# Patient Record
Sex: Male | Born: 1950 | Race: White | Hispanic: No | Marital: Married | Smoking: Current some day smoker
Health system: Southern US, Community
[De-identification: ages and names within clinical notes are randomized; demographics above are authoritative.]

## PROBLEM LIST (undated history)

## (undated) ENCOUNTER — Emergency Department (HOSPITAL_COMMUNITY): Admission: EM | Discharge status: Absent without leave

## (undated) DIAGNOSIS — Z85828 Personal history of other malignant neoplasm of skin: Secondary | ICD-10-CM

## (undated) DIAGNOSIS — N289 Disorder of kidney and ureter, unspecified: Secondary | ICD-10-CM

## (undated) DIAGNOSIS — Z860100 Personal history of colon polyps, unspecified: Secondary | ICD-10-CM

## (undated) DIAGNOSIS — Z7901 Long term (current) use of anticoagulants: Secondary | ICD-10-CM

## (undated) DIAGNOSIS — E119 Type 2 diabetes mellitus without complications: Secondary | ICD-10-CM

## (undated) DIAGNOSIS — M1990 Unspecified osteoarthritis, unspecified site: Secondary | ICD-10-CM

## (undated) DIAGNOSIS — C679 Malignant neoplasm of bladder, unspecified: Secondary | ICD-10-CM

## (undated) DIAGNOSIS — C4490 Unspecified malignant neoplasm of skin, unspecified: Secondary | ICD-10-CM

## (undated) DIAGNOSIS — E785 Hyperlipidemia, unspecified: Secondary | ICD-10-CM

## (undated) DIAGNOSIS — Z9989 Dependence on other enabling machines and devices: Secondary | ICD-10-CM

## (undated) DIAGNOSIS — D494 Neoplasm of unspecified behavior of bladder: Secondary | ICD-10-CM

## (undated) DIAGNOSIS — I480 Paroxysmal atrial fibrillation: Secondary | ICD-10-CM

## (undated) DIAGNOSIS — R351 Nocturia: Secondary | ICD-10-CM

## (undated) DIAGNOSIS — N2889 Other specified disorders of kidney and ureter: Secondary | ICD-10-CM

## (undated) DIAGNOSIS — I10 Essential (primary) hypertension: Secondary | ICD-10-CM

## (undated) DIAGNOSIS — H269 Unspecified cataract: Secondary | ICD-10-CM

## (undated) DIAGNOSIS — Z9889 Other specified postprocedural states: Secondary | ICD-10-CM

## (undated) HISTORY — PX: BLEPHAROPLASTY: SUR158

## (undated) HISTORY — PX: COLONOSCOPY: SHX174

## (undated) HISTORY — PX: PATELLAR TENDON REPAIR: SHX737

## (undated) HISTORY — DX: Type 2 diabetes mellitus without complications: E11.9

## (undated) HISTORY — DX: Unspecified cataract: H26.9

## (undated) HISTORY — DX: Personal history of colonic polyps: Z86.010

## (undated) HISTORY — DX: Essential (primary) hypertension: I10

## (undated) HISTORY — DX: Personal history of colon polyps, unspecified: Z86.0100

## (undated) HISTORY — DX: Hyperlipidemia, unspecified: E78.5

## (undated) HISTORY — DX: Unspecified malignant neoplasm of skin, unspecified: C44.90

## (undated) HISTORY — PX: OTHER SURGICAL HISTORY: SHX169

## (undated) MED FILL — Fosaprepitant Dimeglumine For IV Infusion 150 MG (Base Eq): INTRAVENOUS | Qty: 5 | Status: AC

## (undated) MED FILL — Dexamethasone Sodium Phosphate Inj 100 MG/10ML: INTRAMUSCULAR | Qty: 1 | Status: AC

---
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
## 2003-12-26 ENCOUNTER — Encounter (INDEPENDENT_AMBULATORY_CARE_PROVIDER_SITE_OTHER): Payer: Self-pay | Admitting: Specialist

## 2003-12-26 ENCOUNTER — Ambulatory Visit (HOSPITAL_COMMUNITY): Admission: RE | Admit: 2003-12-26 | Discharge: 2003-12-26 | Payer: Self-pay | Admitting: Gastroenterology

## 2004-04-16 HISTORY — PX: PATELLAR TENDON REPAIR: SHX737

## 2004-04-17 ENCOUNTER — Inpatient Hospital Stay (HOSPITAL_COMMUNITY): Admission: RE | Admit: 2004-04-17 | Discharge: 2004-04-19 | Payer: Self-pay | Admitting: Orthopedic Surgery

## 2007-08-21 ENCOUNTER — Ambulatory Visit: Payer: Self-pay | Admitting: Family Medicine

## 2007-08-21 ENCOUNTER — Telehealth: Payer: Self-pay | Admitting: Family Medicine

## 2007-08-21 DIAGNOSIS — I10 Essential (primary) hypertension: Secondary | ICD-10-CM | POA: Insufficient documentation

## 2007-08-21 DIAGNOSIS — E785 Hyperlipidemia, unspecified: Secondary | ICD-10-CM | POA: Insufficient documentation

## 2007-11-19 ENCOUNTER — Telehealth: Payer: Self-pay | Admitting: Family Medicine

## 2007-11-26 ENCOUNTER — Ambulatory Visit: Payer: Self-pay | Admitting: Family Medicine

## 2007-11-26 LAB — CONVERTED CEMR LAB
AST: 19 units/L (ref 0–37)
Albumin: 3.8 g/dL (ref 3.5–5.2)
Chloride: 101 meq/L (ref 96–112)
Creatinine, Ser: 0.9 mg/dL (ref 0.4–1.5)
Creatinine,U: 281.4 mg/dL
Direct LDL: 91.1 mg/dL
Eosinophils Relative: 1.4 % (ref 0.0–5.0)
HCT: 42 % (ref 39.0–52.0)
Hemoglobin: 14.4 g/dL (ref 13.0–17.0)
Hgb A1c MFr Bld: 9.2 % — ABNORMAL HIGH (ref 4.6–6.0)
Lymphocytes Relative: 27.8 % (ref 12.0–46.0)
MCHC: 34.2 g/dL (ref 30.0–36.0)
Microalb, Ur: 8.6 mg/dL — ABNORMAL HIGH (ref 0.0–1.9)
Monocytes Absolute: 0.6 10*3/uL (ref 0.2–0.7)
Monocytes Relative: 9 % (ref 3.0–11.0)
Nitrite: NEGATIVE
PSA: 1.22 ng/mL (ref 0.10–4.00)
RBC: 4.67 M/uL (ref 4.22–5.81)
RDW: 12 % (ref 11.5–14.6)
TSH: 1.18 microintl units/mL (ref 0.35–5.50)
Total Bilirubin: 0.8 mg/dL (ref 0.3–1.2)
Total CHOL/HDL Ratio: 5.8
Total Protein: 6.1 g/dL (ref 6.0–8.3)
Triglycerides: 377 mg/dL (ref 0–149)
VLDL: 75 mg/dL — ABNORMAL HIGH (ref 0–40)
pH: 6

## 2007-12-10 ENCOUNTER — Ambulatory Visit: Payer: Self-pay | Admitting: Family Medicine

## 2007-12-10 DIAGNOSIS — E119 Type 2 diabetes mellitus without complications: Secondary | ICD-10-CM

## 2008-01-02 ENCOUNTER — Ambulatory Visit: Payer: Self-pay | Admitting: Family Medicine

## 2008-01-14 ENCOUNTER — Telehealth: Payer: Self-pay | Admitting: Family Medicine

## 2008-01-30 ENCOUNTER — Ambulatory Visit: Payer: Self-pay | Admitting: Family Medicine

## 2008-01-30 LAB — CONVERTED CEMR LAB
Calcium: 9.8 mg/dL (ref 8.4–10.5)
Creatinine, Ser: 1 mg/dL (ref 0.4–1.5)
Glucose, Bld: 103 mg/dL — ABNORMAL HIGH (ref 70–99)

## 2008-02-18 ENCOUNTER — Telehealth: Payer: Self-pay | Admitting: Family Medicine

## 2008-05-28 ENCOUNTER — Ambulatory Visit: Payer: Self-pay | Admitting: Family Medicine

## 2008-05-30 ENCOUNTER — Telehealth: Payer: Self-pay | Admitting: Family Medicine

## 2008-06-10 ENCOUNTER — Telehealth: Payer: Self-pay | Admitting: Family Medicine

## 2008-11-25 ENCOUNTER — Telehealth: Payer: Self-pay | Admitting: Family Medicine

## 2008-12-12 ENCOUNTER — Ambulatory Visit: Payer: Self-pay | Admitting: Family Medicine

## 2008-12-12 LAB — CONVERTED CEMR LAB
ALT: 36 units/L (ref 0–53)
AST: 21 units/L (ref 0–37)
Albumin: 4.1 g/dL (ref 3.5–5.2)
Alkaline Phosphatase: 42 units/L (ref 39–117)
BUN: 22 mg/dL (ref 6–23)
Basophils Absolute: 0 10*3/uL (ref 0.0–0.1)
Basophils Relative: 0 % (ref 0.0–3.0)
Bilirubin, Direct: 0.1 mg/dL (ref 0.0–0.3)
Blood in Urine, dipstick: NEGATIVE
CO2: 27 meq/L (ref 19–32)
Calcium: 9.3 mg/dL (ref 8.4–10.5)
Chloride: 105 meq/L (ref 96–112)
Cholesterol: 181 mg/dL (ref 0–200)
Creatinine, Ser: 0.9 mg/dL (ref 0.4–1.5)
Eosinophils Absolute: 0.2 10*3/uL (ref 0.0–0.7)
Eosinophils Relative: 2.6 % (ref 0.0–5.0)
GFR calc Af Amer: 111 mL/min
GFR calc non Af Amer: 92 mL/min
Glucose, Bld: 147 mg/dL — ABNORMAL HIGH (ref 70–99)
Glucose, Urine, Semiquant: NEGATIVE
HCT: 45.8 % (ref 39.0–52.0)
HDL: 29.8 mg/dL — ABNORMAL LOW (ref >39.0)
Hemoglobin: 15.7 g/dL (ref 13.0–17.0)
Hgb A1c MFr Bld: 6.5 % — ABNORMAL HIGH (ref 4.6–6.0)
Ketones, urine, test strip: NEGATIVE
LDL Cholesterol: 102 mg/dL — ABNORMAL HIGH (ref 0–99)
Lymphocytes Relative: 29.3 % (ref 12.0–46.0)
MCHC: 34.4 g/dL (ref 30.0–36.0)
MCV: 92.1 fL (ref 78.0–100.0)
Monocytes Absolute: 0.7 10*3/uL (ref 0.1–1.0)
Monocytes Relative: 8.5 % (ref 3.0–12.0)
Neutro Abs: 5 10*3/uL (ref 1.4–7.7)
Neutrophils Relative %: 59.6 % (ref 43.0–77.0)
PSA: 0.94 ng/mL (ref 0.10–4.00)
Platelets: 196 10*3/uL (ref 150–400)
Potassium: 4.6 meq/L (ref 3.5–5.1)
RBC: 4.97 M/uL (ref 4.22–5.81)
RDW: 12.3 % (ref 11.5–14.6)
Sodium: 141 meq/L (ref 135–145)
Specific Gravity, Urine: 1.02
TSH: 1.12 microintl units/mL (ref 0.35–5.50)
Total Bilirubin: 1 mg/dL (ref 0.3–1.2)
Total CHOL/HDL Ratio: 6.1
Total Protein: 6.7 g/dL (ref 6.0–8.3)
Triglycerides: 244 mg/dL (ref 0–149)
VLDL: 49 mg/dL — ABNORMAL HIGH (ref 0–40)
WBC Urine, dipstick: NEGATIVE
WBC: 8.4 10*3/uL (ref 4.5–10.5)
pH: 7

## 2008-12-19 ENCOUNTER — Ambulatory Visit: Payer: Self-pay | Admitting: Family Medicine

## 2008-12-19 DIAGNOSIS — Z85828 Personal history of other malignant neoplasm of skin: Secondary | ICD-10-CM

## 2008-12-19 DIAGNOSIS — F528 Other sexual dysfunction not due to a substance or known physiological condition: Secondary | ICD-10-CM

## 2008-12-24 ENCOUNTER — Telehealth: Payer: Self-pay | Admitting: Family Medicine

## 2009-01-12 ENCOUNTER — Ambulatory Visit: Payer: Self-pay | Admitting: Internal Medicine

## 2009-01-22 ENCOUNTER — Encounter: Payer: Self-pay | Admitting: Internal Medicine

## 2009-01-22 ENCOUNTER — Ambulatory Visit: Payer: Self-pay | Admitting: Internal Medicine

## 2009-01-22 LAB — HM COLONOSCOPY: HM Colonoscopy: 4

## 2009-02-02 ENCOUNTER — Telehealth: Payer: Self-pay | Admitting: Family Medicine

## 2009-02-02 ENCOUNTER — Encounter: Payer: Self-pay | Admitting: Internal Medicine

## 2009-06-05 ENCOUNTER — Ambulatory Visit: Payer: Self-pay | Admitting: Family Medicine

## 2009-06-05 LAB — CONVERTED CEMR LAB
BUN: 26 mg/dL — ABNORMAL HIGH (ref 6–23)
Calcium: 9.7 mg/dL (ref 8.4–10.5)
Chloride: 106 meq/L (ref 96–112)
Creatinine, Ser: 1.2 mg/dL (ref 0.4–1.5)
GFR calc non Af Amer: 65.91 mL/min (ref >60)
HDL: 30.1 mg/dL — ABNORMAL LOW (ref >39.00)
Hgb A1c MFr Bld: 6.4 % (ref 4.6–6.5)
Sodium: 142 meq/L (ref 135–145)
Total CHOL/HDL Ratio: 5
VLDL: 47.8 mg/dL — ABNORMAL HIGH (ref 0.0–40.0)

## 2009-06-15 ENCOUNTER — Ambulatory Visit: Payer: Self-pay | Admitting: Family Medicine

## 2009-12-07 ENCOUNTER — Ambulatory Visit: Payer: Self-pay | Admitting: Family Medicine

## 2009-12-07 LAB — CONVERTED CEMR LAB
ALT: 40 units/L (ref 0–53)
AST: 26 units/L (ref 0–37)
Alkaline Phosphatase: 53 units/L (ref 39–117)
BUN: 15 mg/dL (ref 6–23)
Bilirubin Urine: NEGATIVE
Bilirubin, Direct: 0.1 mg/dL (ref 0.0–0.3)
Blood in Urine, dipstick: NEGATIVE
CO2: 30 meq/L (ref 19–32)
Creatinine,U: 204.5 mg/dL
Direct LDL: 111 mg/dL
Eosinophils Absolute: 0.2 10*3/uL (ref 0.0–0.7)
HCT: 44.1 % (ref 39.0–52.0)
Hgb A1c MFr Bld: 6.8 % — ABNORMAL HIGH (ref 4.6–6.5)
MCV: 94.6 fL (ref 78.0–100.0)
Monocytes Relative: 9.8 % (ref 3.0–12.0)
PSA: 1.33 ng/mL (ref 0.10–4.00)
Platelets: 156 10*3/uL (ref 150.0–400.0)
RDW: 12.6 % (ref 11.5–14.6)
Specific Gravity, Urine: 1.025
TSH: 1.23 microintl units/mL (ref 0.35–5.50)
Total Protein: 6.5 g/dL (ref 6.0–8.3)
Triglycerides: 416 mg/dL — ABNORMAL HIGH (ref 0.0–149.0)
VLDL: 83.2 mg/dL — ABNORMAL HIGH (ref 0.0–40.0)
WBC Urine, dipstick: NEGATIVE
pH: 7

## 2009-12-14 ENCOUNTER — Ambulatory Visit: Payer: Self-pay | Admitting: Family Medicine

## 2010-03-16 ENCOUNTER — Ambulatory Visit: Payer: Self-pay | Admitting: Family Medicine

## 2010-03-16 LAB — CONVERTED CEMR LAB
Calcium: 9.6 mg/dL (ref 8.4–10.5)
Creatinine, Ser: 0.9 mg/dL (ref 0.4–1.5)

## 2010-03-31 ENCOUNTER — Telehealth: Payer: Self-pay | Admitting: Family Medicine

## 2010-03-31 ENCOUNTER — Ambulatory Visit: Payer: Self-pay | Admitting: Family Medicine

## 2010-03-31 DIAGNOSIS — M549 Dorsalgia, unspecified: Secondary | ICD-10-CM | POA: Insufficient documentation

## 2010-03-31 LAB — HM DIABETES FOOT EXAM

## 2010-04-01 ENCOUNTER — Encounter: Payer: Self-pay | Admitting: Family Medicine

## 2010-04-21 ENCOUNTER — Telehealth: Payer: Self-pay | Admitting: Family Medicine

## 2010-11-04 ENCOUNTER — Telehealth: Payer: Self-pay | Admitting: Family Medicine

## 2010-11-21 LAB — CONVERTED CEMR LAB
Albumin: 4.1 g/dL (ref 3.5–5.2)
BUN: 15 mg/dL (ref 6–23)
Basophils Absolute: 0 10*3/uL (ref 0.0–0.1)
Bilirubin Urine: NEGATIVE
CO2: 29 meq/L (ref 19–32)
Calcium: 9.5 mg/dL (ref 8.4–10.5)
Chloride: 108 meq/L (ref 96–112)
Cholesterol: 204 mg/dL (ref 0–200)
Direct LDL: 127 mg/dL
Eosinophils Absolute: 0.1 10*3/uL (ref 0.0–0.6)
GFR calc non Af Amer: 93 mL/min
Glucose, Bld: 182 mg/dL — ABNORMAL HIGH (ref 70–99)
Glucose, Urine, Semiquant: NEGATIVE
HCT: 43.2 % (ref 39.0–52.0)
Hemoglobin: 15.1 g/dL (ref 13.0–17.0)
Monocytes Absolute: 0.5 10*3/uL (ref 0.2–0.7)
Monocytes Relative: 8.2 % (ref 3.0–11.0)
Nitrite: NEGATIVE
RBC: 4.72 M/uL (ref 4.22–5.81)
Sodium: 138 meq/L (ref 135–145)
Specific Gravity, Urine: 1.025
Total Bilirubin: 0.7 mg/dL (ref 0.3–1.2)
Total Protein: 6.6 g/dL (ref 6.0–8.3)
Urobilinogen, UA: 0.2
VLDL: 45 mg/dL — ABNORMAL HIGH (ref 0–40)
WBC Urine, dipstick: NEGATIVE

## 2010-11-23 NOTE — Assessment & Plan Note (Signed)
Summary: follow up/discuss a1c/cjr   Vital Signs:  Patient profile:   60 year old male Height:      74 inches Weight:      252 pounds BMI:     32.47 Temp:     97.9 degrees F oral BP sitting:   120 / 70  (left arm)  Vitals Entered By: Kathrynn Speed CMA (March 31, 2010 10:52 AM)  CC: FU A1C   CC:  FU A1C.  History of Present Illness: Robert [name] is a 60 year old, married male, who smokes one or two cigars per week and comes in today for evaluation of diabetes and low back pain.  We gave him a prescription for the chantix he's not filled it yet.  His blood sugar is 136 with an A1c is 6.6 on metformin 1000 mg b.i.d. and Glucotrol 5 mg daily.  He said history of low back pain.  It comes and goes.  It one time he had a diagnostic evaluation, which showed a bulging disk at L4-L5.  He tends to have more pain when he lies down at night.  He has no neurologic deficit.  He swims 25 minutes 5 times a week  Current Medications (verified): 1)  Bayer Aspirin Ec Low Dose 81 Mg  Tbec (Aspirin) .... One By Mouth Daily 2)  Glucophage 1000 Mg  Tabs (Metformin Hcl) .... Take 1 Tablet By Mouth Two Times A Day 3)  Glucotrol 5 Mg  Tabs (Glipizide) .... Take 1 Tablet By Mouth Once Daily 4)  Hydrochlorothiazide 12.5 Mg  Caps (Hydrochlorothiazide) .... Take 1 Tablet By Mouth Every Morning 5)  Zestril 40 Mg  Tabs (Lisinopril) .[name] Kitchen.. 1 and 1/2 Qam 6)  Simvastatin 80 Mg Tabs (Simvastatin) .... At Bedtime 7)  Chantix Starting Month Pak 0.5 Mg X 11 & 1 Mg X 42 Tabs (Varenicline Tartrate) .... Uad 8)  Chantix Continuing Month Pak 1 Mg Tabs (Varenicline Tartrate) .... Uad 9)  Viagra 100 Mg Tabs (Sildenafil Citrate) .... Uad  Allergies (verified): No Known Drug Allergies  Past History:  Past medical, surgical, family and social histories (including risk factors) reviewed for relevance to current acute and chronic problems.  Past Medical History: Reviewed history from 12/19/2008 and no changes  required. Hyperlipidemia Hypertension removal of excess tissue, right and left upper eyelids repair of torn left patellar tendon Diabetes mellitus, type II Skin cancer, hx of Colonic polyps, hx of  Family History: Reviewed history from 08/21/2007 and no changes required. Family History of Aneurysm Aortic Family History Breast cancer 1st degree relative <50  Social History: Reviewed history from 08/21/2007 and no changes required. Occupation: dvd,s Married Former Smoker Alcohol use-yes Drug use-no Regular exercise-yes  Review of Systems      See HPI  Physical Exam  General:  Well-developed,well-nourished,in no acute distress; alert,appropriate and cooperative throughout examination Msk:  No deformity or scoliosis noted of thoracic or lumbar spine.  .......1/8 to one quarter leg length discrepancy, left leg shorter than right Pulses:  R and L carotid,radial,femoral,dorsalis pedis and posterior tibial pulses are full and equal bilaterally Extremities:  No clubbing, cyanosis, edema, or deformity noted with normal full range of motion of all joints.   Neurologic:  No cranial nerve deficits noted. Station and gait are normal. Plantar reflexes are down-going bilaterally. DTRs are symmetrical throughout. Sensory, motor and coordinative functions appear intact.  Diabetes Management Exam:    Foot Exam (with socks and/or shoes not present):       Sensory-Pinprick/Light touch:  Left medial foot (L-4): normal          Left dorsal foot (L-5): normal          Left lateral foot (S-1): normal          Right medial foot (L-4): normal          Right dorsal foot (L-5): normal          Right lateral foot (S-1): normal       Sensory-Monofilament:          Left foot: normal          Right foot: normal       Inspection:          Left foot: normal          Right foot: normal       Nails:          Left foot: normal          Right foot: normal    Eye Exam:       Eye Exam done  elsewhere          Date: 03/29/2010          Results: normal          Done by: opth   Problems:  Medical Problems Added: 1)  Dx of Back Pain  (ICD-724.5)  Impression & Recommendations:  Problem # 1:  BACK PAIN (ICD-724.5) Assessment New  His updated medication list for this problem includes:    Bayer Aspirin Ec Low Dose 81 Mg Tbec (Aspirin) ..... One by mouth daily    Flexeril 10 Mg Tabs (Cyclobenzaprine hcl) .[name] Kitchen... 1 tab @ bedtime    Vicodin Es 7.5-750 Mg Tabs (Hydrocodone-acetaminophen) .[name] Kitchen... 1 tab @ bedtime  Problem # 2:  DIABETES MELLITUS, TYPE II (ICD-250.00) Assessment: Improved  His updated medication list for this problem includes:    Bayer Aspirin Ec Low Dose 81 Mg Tbec (Aspirin) ..... One by mouth daily    Glucophage 1000 Mg Tabs (Metformin hcl) .[name] Kitchen... Take 1 tablet by mouth two times a day    Glucotrol 5 Mg Tabs (Glipizide) .[name] Kitchen... Take 1 tablet by mouth once daily    Zestril 40 Mg Tabs (Lisinopril) .[name] Kitchen... 1 and 1/2 qam  Complete Medication List: 1)  Bayer Aspirin Ec Low Dose 81 Mg Tbec (Aspirin) .... One by mouth daily 2)  Glucophage 1000 Mg Tabs (Metformin hcl) .... Take 1 tablet by mouth two times a day 3)  Glucotrol 5 Mg Tabs (Glipizide) .... Take 1 tablet by mouth once daily 4)  Hydrochlorothiazide 12.5 Mg Caps (Hydrochlorothiazide) .... Take 1 tablet by mouth every morning 5)  Zestril 40 Mg Tabs (Lisinopril) .[name] Kitchen.. 1 and 1/2 qam 6)  Simvastatin 80 Mg Tabs (Simvastatin) .... At bedtime 7)  Chantix Starting Month Pak 0.5 Mg X 11 & 1 Mg X 42 Tabs (Varenicline tartrate) .... Uad 8)  Chantix Continuing Month Pak 1 Mg Tabs (Varenicline tartrate) .... Uad 9)  Viagra 100 Mg Tabs (Sildenafil citrate) .... Uad 10)  Flexeril 10 Mg Tabs (Cyclobenzaprine hcl) .[name] Kitchen.. 1 tab @ bedtime 11)  Vicodin Es 7.5-750 Mg Tabs (Hydrocodone-acetaminophen) .[name] Kitchen.. 1 tab @ bedtime  Patient Instructions: 1)  continue your current treatment program for your.  Diabetes. 2)  Do the stretching  exercises before swimming, avoid sitting, and u can take a half of Flexeril and/or Vicodin at bedtime as needed for severe pain. 3)  Return in February 2012 for your next exam 4)  BMP prior to visit, ICD-9:........250.00 5)  Hepatic Panel prior to visit, ICD-9: 6)  Lipid Panel prior to visit, ICD-9: 7)  TSH prior to visit, ICD-9: 8)  CBC w/ Diff prior to visit, ICD-9: 9)  Urine-dip prior to visit, ICD-9: 10)  PSA prior to visit, ICD-9: 11)  HbgA1C prior to visit, ICD-9: 12)  Urine Microalbumin prior to visit, ICD-9: Prescriptions: FLEXERIL 10 MG TABS (CYCLOBENZAPRINE HCL) 1 tab @ bedtime  #30 x 2   Entered and Authorized by:   Roderick Pee MD   Signed by:   Roderick Pee MD on 03/31/2010   Method used:   Print then Give to Patient   RxID:   [id number] VICODIN ES 7.5-750 MG TABS (HYDROCODONE-ACETAMINOPHEN) 1 tab @ bedtime  #30 x 2   Entered and Authorized by:   Roderick Pee MD   Signed by:   Roderick Pee MD on 03/31/2010   Method used:   Print then Give to Patient   RxID:   [id number] FLEXERIL 10 MG TABS (CYCLOBENZAPRINE HCL) 1 tab @ bedtime  #30 x 2   Entered and Authorized by:   Roderick Pee MD   Signed by:   Roderick Pee MD on 03/31/2010   Method used:   Electronically to        [facility] [phone]* (retail)       [address]       [place], [place]  [address]       Ph: [phone] or [phone]       Fax: [phone]   RxID:   [id number]

## 2010-11-23 NOTE — Progress Notes (Signed)
Summary: glipizide refill  Phone Note Call from Patient   Summary of Call: patient is now taking glipizide two times a day and has ran out of rx Initial call taken by: Kern Reap CMA Duncan Dull),  April 21, 2010 8:52 AM    New/Updated Medications: GLUCOTROL 5 MG  TABS (GLIPIZIDE) Take 1 tablet by mouth two times a day Prescriptions: GLUCOTROL 5 MG  TABS (GLIPIZIDE) Take 1 tablet by mouth two times a day  #180 x 3   Entered by:   Kern Reap CMA (AAMA)   Authorized by:   Roderick Pee MD   Signed by:   Kern Reap CMA (AAMA) on 04/21/2010   Method used:   Electronically to        [facility] [phone]* (retail)       [address]       [place], [place]  [address]       Ph: [phone] or [phone]       Fax: [phone]   RxID:   [id number]

## 2010-11-23 NOTE — Miscellaneous (Signed)
Summary: eye exam  Clinical Lists Changes  Observations: Added new observation of EYES COMMENT: 03/2011 (04/01/2010 10:42) Added new observation of EYE EXAM BY: hollander,md (03/29/2010 10:42) Added new observation of DMEYEEXMRES: normal (03/29/2010 10:42) Added new observation of DIAB EYE EX: normal (03/29/2010 10:42)      Diabetes Management History:      He says that he is exercising.    Diabetes Management Exam:    Eye Exam:       Eye Exam done elsewhere          Date: 03/29/2010          Results: normal          Done by: hollander,md

## 2010-11-23 NOTE — Assessment & Plan Note (Signed)
Summary: CPX//SLM   Vital Signs:  Patient profile:   60 year old male Height:      74 inches Weight:      255 pounds Temp:     98.3 degrees F oral BP sitting:   140 / 80  (left arm) Cuff size:   regular  Vitals Entered By: Kern Reap CMA Duncan Dull) (December 14, 2009 9:35 AM)  Reason for Visit cpx  History of Present Illness: Robert [name] is a 60 year old, married male smoker (, one cigar every two weeks) comes in today for evaluation of multiple issues.  He has underlying diabetes, for which he takes Glucophage 1000 mg b.i.d., Glucotrol, 5 mg daily.  Fasting blood sugar 178, and hemoglobin A1C6.8.  He has underlying hypertension, for which he takes Zestril 60 mg daily.  BP 140/80.  He has hyperlipidemia for which he takes simvastatin 80 mg daily total cholesterol 218 HDL 40 LDL 111.  He also has erectile dysfunction.  However, the Levitra is not working.  Would like to try Viagra.  He takes a baby aspirin daily, along with HCTZ 12.5 mg daily.  We have offered the chantix in the past.  However, he's not purchased the product.  He gets routine eye care.  Dental care.  Colonoscopy 2009 normal, tetanus booster 2008, declined flu shot  Diabetes Management History:      He says that he is exercising.    Preventive Screening-Counseling & Management  Alcohol-Tobacco     Smoking Cessation Counseling: yes  Allergies: No Known Drug Allergies  Past History:  Past medical, surgical, family and social histories (including risk factors) reviewed, and no changes noted (except as noted below).  Past Medical History: Reviewed history from 12/19/2008 and no changes required. Hyperlipidemia Hypertension removal of excess tissue, right and left upper eyelids repair of torn left patellar tendon Diabetes mellitus, type II Skin cancer, hx of Colonic polyps, hx of  Family History: Reviewed history from 08/21/2007 and no changes required. Family History of Aneurysm Aortic Family History  Breast cancer 1st degree relative <50  Social History: Reviewed history from 08/21/2007 and no changes required. Occupation: dvd,s Married Former Smoker Alcohol use-yes Drug use-no Regular exercise-yes  Review of Systems      See HPI  Physical Exam  General:  Well-developed,well-nourished,in no acute distress; alert,appropriate and cooperative throughout examination Head:  Normocephalic and atraumatic without obvious abnormalities. No apparent alopecia or balding. Eyes:  No corneal or conjunctival inflammation noted. EOMI. Perrla. Funduscopic exam benign, without hemorrhages, exudates or papilledema. Vision grossly normal. Ears:  External ear exam shows no significant lesions or deformities.  Otoscopic examination reveals clear canals, tympanic membranes are intact bilaterally without bulging, retraction, inflammation or discharge. Hearing is grossly normal bilaterally. Nose:  External nasal examination shows no deformity or inflammation. Nasal mucosa are pink and moist without lesions or exudates. Mouth:  Oral mucosa and oropharynx without lesions or exudates.  Teeth in good repair. Neck:  No deformities, masses, or tenderness noted. Chest Wall:  No deformities, masses, tenderness or gynecomastia noted. Breasts:  No masses or gynecomastia noted Lungs:  Normal respiratory effort, chest expands symmetrically. Lungs are clear to auscultation, no crackles or wheezes. Heart:  Normal rate and regular rhythm. S1 and S2 normal without gallop, murmur, click, rub or other extra sounds. Abdomen:  Bowel sounds positive,abdomen soft and non-tender without masses, organomegaly or hernias noted. Rectal:  No external abnormalities noted. Normal sphincter tone. No rectal masses or tenderness. Genitalia:  Testes bilaterally descended without nodularity,  tenderness or masses. No scrotal masses or lesions. No penis lesions or urethral discharge. Prostate:  Prostate gland firm and smooth, no enlargement,  nodularity, tenderness, mass, asymmetry or induration. Msk:  No deformity or scoliosis noted of thoracic or lumbar spine.   Pulses:  R and L carotid,radial,femoral,dorsalis pedis and posterior tibial pulses are full and equal bilaterally Extremities:  No clubbing, cyanosis, edema, or deformity noted with normal full range of motion of all joints.   Neurologic:  No cranial nerve deficits noted. Station and gait are normal. Plantar reflexes are down-going bilaterally. DTRs are symmetrical throughout. Sensory, motor and coordinative functions appear intact. Skin:  Intact without suspicious lesions or rashes Cervical Nodes:  No lymphadenopathy noted Axillary Nodes:  No palpable lymphadenopathy Inguinal Nodes:  No significant adenopathy Psych:  Cognition and judgment appear intact. Alert and cooperative with normal attention span and concentration. No apparent delusions, illusions, hallucinations  Diabetes Management Exam:    Foot Exam (with socks and/or shoes not present):       Sensory-Pinprick/Light touch:          Left medial foot (L-4): normal          Left dorsal foot (L-5): normal          Left lateral foot (S-1): normal          Right medial foot (L-4): normal          Right dorsal foot (L-5): normal          Right lateral foot (S-1): normal       Sensory-Monofilament:          Left foot: normal          Right foot: normal       Inspection:          Left foot: normal          Right foot: normal       Nails:          Left foot: normal          Right foot: normal    Eye Exam:       Eye Exam done elsewhere          Date: 2007          Results: normal          Done by: opth   Impression & Recommendations:  Problem # 1:  ERECTILE DYSFUNCTION (ICD-302.72) Assessment Unchanged  The following medications were removed from the medication list:    Levitra 20 Mg Tabs (Vardenafil hcl) ..... One prn His updated medication list for this problem includes:    Viagra 100 Mg Tabs  (Sildenafil citrate) ..... Uad  Orders: Prescription Created Electronically [phone] Tobacco use cessation intermediate 3-10 minutes (98119)  Problem # 2:  DIABETES MELLITUS, TYPE II (ICD-250.00) Assessment: Improved  His updated medication list for this problem includes:    Bayer Aspirin Ec Low Dose 81 Mg Tbec (Aspirin) ..... One by mouth daily    Glucophage 1000 Mg Tabs (Metformin hcl) .[name] Kitchen... Take 1 tablet by mouth two times a day    Glucotrol 5 Mg Tabs (Glipizide) .[name] Kitchen... Take 1 tablet by mouth once daily    Zestril 40 Mg Tabs (Lisinopril) .[name] Kitchen... 1 and 1/2 qam  Orders: Prescription Created Electronically [phone]) Tobacco use cessation intermediate 3-10 minutes (95621)  Problem # 3:  HYPERTENSION (ICD-401.9) Assessment: Improved  His updated medication list for this problem includes:    Hydrochlorothiazide 12.5 Mg Caps (Hydrochlorothiazide) .[name] KitchenMarland KitchenMarland KitchenMarland Kitchen  Take 1 tablet by mouth every morning    Zestril 40 Mg Tabs (Lisinopril) .[name] Kitchen... 1 and 1/2 qam  Orders: Prescription Created Electronically [phone]) Tobacco use cessation intermediate 3-10 minutes (99406) EKG w/ Interpretation (93000)  Problem # 4:  HYPERLIPIDEMIA, MIXED (ICD-272.2) Assessment: Improved  His updated medication list for this problem includes:    Simvastatin 80 Mg Tabs (Simvastatin) .[name] Kitchen... At bedtime  Complete Medication List: 1)  Bayer Aspirin Ec Low Dose 81 Mg Tbec (Aspirin) .... One by mouth daily 2)  Glucophage 1000 Mg Tabs (Metformin hcl) .... Take 1 tablet by mouth two times a day 3)  Glucotrol 5 Mg Tabs (Glipizide) .... Take 1 tablet by mouth once daily 4)  Hydrochlorothiazide 12.5 Mg Caps (Hydrochlorothiazide) .... Take 1 tablet by mouth every morning 5)  Zestril 40 Mg Tabs (Lisinopril) .[name] Kitchen.. 1 and 1/2 qam 6)  Simvastatin 80 Mg Tabs (Simvastatin) .... At bedtime 7)  Chantix Starting Month Pak 0.5 Mg X 11 & 1 Mg X 42 Tabs (Varenicline tartrate) .... Uad 8)  Chantix Continuing Month Pak 1 Mg Tabs (Varenicline tartrate)  .... Uad 9)  Viagra 100 Mg Tabs (Sildenafil citrate) .... Uad  Patient Instructions: 1)  Tobacco is very bad for your health and your loved ones! You Should stop smoking!. 2)  Stop Smoking Tips: Choose a Quit date. Cut down before the Quit date. decide what you will do as a substitute when you feel the urge to smoke(gum,toothpick,exercise). 3)  It is important that you exercise regularly at least 20 minutes 5 times a week. If you develop chest pain, have severe difficulty breathing, or feel very tired , stop exercising immediately and seek medical attention. 4)  Take an Aspirin every day. 5)  Check your blood sugars regularly. If your readings are usually above : or below 70 you should contact our office. 6)  It is important that your Diabetic A1c level is checked every 3 months. 7)  See your eye doctor yearly to check for diabetic eye damage. 8)  Check your feet each night for sore areas, calluses or signs of infection. 9)  Check your Blood Pressure regularly. If it is above: you should make an appointment. 10)  continue the exercise program, check a fasting blood sugar weekly return in 3 months for follow-up 11)  BMP prior to visit, ICD-9:..............250.00 12)  HbgA1C prior to visit, ICD-9: Prescriptions: SIMVASTATIN 80 MG TABS (SIMVASTATIN) at bedtime  #100 Tablet x 3   Entered and Authorized by:   Roderick Pee MD   Signed by:   Roderick Pee MD on 12/14/2009   Method used:   [facility] to        [facility] [phone]* (retail)       [address]       [place], [place]  [address]       Ph: [phone] or [phone]       Fax: [phone]   RxID:   [id number] ZESTRIL 40 MG  TABS (LISINOPRIL) 1 and 1/2 qam  #150 x 3   Entered and Authorized by:   Roderick Pee MD   Signed by:   Roderick Pee MD on 12/14/2009   Method used:   Electronically to        [facility] [phone]* (retail)       [address]       [place], [place]  [address]       Ph: [phone] or  [phone]  Fax: [phone]   RxID:   [id number] HYDROCHLOROTHIAZIDE 12.5 MG  CAPS (HYDROCHLOROTHIAZIDE) Take 1 tablet by mouth every morning  #100 x 3   Entered and Authorized by:   Roderick Pee MD   Signed by:   Roderick Pee MD on 12/14/2009   Method used:   Electronically to        [facility] [phone]* (retail)       [address]       [place], [place]  [address]       Ph: [phone] or [phone]       Fax: [phone]   RxID:   [id number] GLUCOTROL 5 MG  TABS (GLIPIZIDE) Take 1 tablet by mouth once daily  #100 x 3   Entered and Authorized by:   Roderick Pee MD   Signed by:   Roderick Pee MD on 12/14/2009   Method used:   Electronically to        [facility] [phone]* (retail)       [address]       [place], [place]  [address]       Ph: [phone] or [phone]       Fax: [phone]   RxID:   [id number] GLUCOPHAGE 1000 MG  TABS (METFORMIN HCL) Take 1 tablet by mouth two times a day  #200 x 3   Entered and Authorized by:   Roderick Pee MD   Signed by:   Roderick Pee MD on 12/14/2009   Method used:   Electronically to        [facility] [phone]* (retail)       [address]       [place], [place]  [address]       Ph: [phone] or [phone]       Fax: [phone]   RxID:   [id number] VIAGRA 100 MG TABS (SILDENAFIL CITRATE) UAD  #6 x 11   Entered and Authorized by:   Roderick Pee MD   Signed by:   Roderick Pee MD on 12/14/2009   Method used:   Electronically to        [facility] [phone]* (retail)       [address]       [place], [place]  [address]       Ph: [phone] or [phone]       Fax: [phone]   RxID:   [id number]      Orders Added: 1)  Prescription Created Electronically [G8553] 2)  Est. Patient 40-64 years [99396] 3)  Tobacco use cessation intermediate 3-10 minutes [99406] 4)  EKG w/ Interpretation [93000]

## 2010-11-23 NOTE — Progress Notes (Signed)
       New/Updated Medications: CRESTOR 20 MG TABS (ROSUVASTATIN CALCIUM) 1 tab @ bedtime Prescriptions: CRESTOR 20 MG TABS (ROSUVASTATIN CALCIUM) 1 tab @ bedtime  #100 x 3   Entered and Authorized by:   Roderick Pee MD   Signed by:   Roderick Pee MD on 03/31/2010   Method used:   Electronically to        [facility] [phone]* (retail)       [address]       [place], [place]  [address]       Ph: [phone] or [phone]       Fax: [phone]   RxID:   [id number] CRESTOR 20 MG TABS (ROSUVASTATIN CALCIUM) 1 tab @ bedtime  #199 x 3   Entered and Authorized by:   Roderick Pee MD   Signed by:   Roderick Pee MD on 03/31/2010   Method used:   Electronically to        [facility] [phone]* (retail)       [address]       [place], [place]  [address]       Ph: [phone] or [phone]       Fax: [phone]   RxID:   [id number]   Appended Document:  left message on machine for patient to return in 2 months for a lipid and hepatic panel

## 2010-11-25 NOTE — Progress Notes (Signed)
  Phone Note Call from Patient   Caller: Patient Call For: Roderick Pee MD Summary of Call: [phone] What can pt take for URI with diabetes? Initial call taken by: [facility] CMA AAMA,  November 04, 2010 2:37 PM  Follow-up for Phone Call        drank 30 ounces of water daily.  Vaporizer in your bedroom at night.  Hydromet one half to 1 teaspoon at bedtime p.r.n. for cough.................[name] KitchenCall in  a 4-ounce bottle, one refill     Follow-up by: Roderick Pee MD,  November 04, 2010 2:55 PM  Additional Follow-up for Phone Call Additional follow up Details #1::        Appt Scheduled Today

## 2010-12-09 ENCOUNTER — Other Ambulatory Visit: Payer: Self-pay | Admitting: Family Medicine

## 2010-12-10 ENCOUNTER — Other Ambulatory Visit: Payer: BC Managed Care – PPO | Admitting: Family Medicine

## 2010-12-10 DIAGNOSIS — Z0000 Encounter for general adult medical examination without abnormal findings: Secondary | ICD-10-CM

## 2010-12-10 DIAGNOSIS — E785 Hyperlipidemia, unspecified: Secondary | ICD-10-CM

## 2010-12-10 LAB — HEPATIC FUNCTION PANEL
Bilirubin, Direct: 0.1 mg/dL (ref 0.0–0.3)
Total Bilirubin: 0.7 mg/dL (ref 0.3–1.2)
Total Protein: 6.3 g/dL (ref 6.0–8.3)

## 2010-12-10 LAB — LIPID PANEL
HDL: 35.4 mg/dL — ABNORMAL LOW (ref >39.00)
Total CHOL/HDL Ratio: 4
VLDL: 49.4 mg/dL — ABNORMAL HIGH (ref 0.0–40.0)

## 2010-12-10 LAB — BASIC METABOLIC PANEL
BUN: 14 mg/dL (ref 6–23)
CO2: 27 mEq/L (ref 19–32)
Calcium: 9 mg/dL (ref 8.4–10.5)
Chloride: 103 mEq/L (ref 96–112)
GFR: 96.32 mL/min (ref >60.00)
Potassium: 4.9 mEq/L (ref 3.5–5.1)

## 2010-12-10 LAB — LDL CHOLESTEROL, DIRECT: Direct LDL: 77.5 mg/dL

## 2010-12-10 LAB — POCT URINALYSIS DIPSTICK
Glucose, UA: NEGATIVE
Nitrite, UA: NEGATIVE
Urobilinogen, UA: 0.2
pH, UA: 6.5

## 2010-12-10 LAB — CBC WITH DIFFERENTIAL/PLATELET
Basophils Relative: 0.5 % (ref 0.0–3.0)
Lymphs Abs: 2.9 10*3/uL (ref 0.7–4.0)
Monocytes Absolute: 0.6 10*3/uL (ref 0.1–1.0)
Monocytes Relative: 7.2 % (ref 3.0–12.0)
Neutrophils Relative %: 55.5 % (ref 43.0–77.0)

## 2010-12-10 LAB — TSH: TSH: 0.97 u[IU]/mL (ref 0.35–5.50)

## 2010-12-10 LAB — MICROALBUMIN / CREATININE URINE RATIO: Microalb, Ur: 0.8 mg/dL (ref 0.0–1.9)

## 2010-12-14 ENCOUNTER — Encounter: Payer: Self-pay | Admitting: Family Medicine

## 2010-12-14 DIAGNOSIS — E119 Type 2 diabetes mellitus without complications: Secondary | ICD-10-CM

## 2010-12-14 DIAGNOSIS — E785 Hyperlipidemia, unspecified: Secondary | ICD-10-CM

## 2010-12-14 DIAGNOSIS — C4490 Unspecified malignant neoplasm of skin, unspecified: Secondary | ICD-10-CM

## 2010-12-14 DIAGNOSIS — Z86010 Personal history of colonic polyps: Secondary | ICD-10-CM

## 2010-12-14 DIAGNOSIS — I10 Essential (primary) hypertension: Secondary | ICD-10-CM

## 2010-12-15 ENCOUNTER — Ambulatory Visit (INDEPENDENT_AMBULATORY_CARE_PROVIDER_SITE_OTHER): Payer: BC Managed Care – PPO | Admitting: Family Medicine

## 2010-12-15 ENCOUNTER — Encounter: Payer: Self-pay | Admitting: Family Medicine

## 2010-12-15 ENCOUNTER — Other Ambulatory Visit: Payer: Self-pay | Admitting: Family Medicine

## 2010-12-15 DIAGNOSIS — E785 Hyperlipidemia, unspecified: Secondary | ICD-10-CM

## 2010-12-15 DIAGNOSIS — N529 Male erectile dysfunction, unspecified: Secondary | ICD-10-CM

## 2010-12-15 DIAGNOSIS — F17200 Nicotine dependence, unspecified, uncomplicated: Secondary | ICD-10-CM

## 2010-12-15 DIAGNOSIS — E119 Type 2 diabetes mellitus without complications: Secondary | ICD-10-CM

## 2010-12-15 DIAGNOSIS — I10 Essential (primary) hypertension: Secondary | ICD-10-CM

## 2010-12-15 MED ORDER — LISINOPRIL 40 MG PO TABS: 40.0000 mg | ORAL_TABLET | Freq: Every day | ORAL | Status: DC

## 2010-12-15 MED ORDER — ROSUVASTATIN CALCIUM 20 MG PO TABS: 20.0000 mg | ORAL_TABLET | Freq: Every day | ORAL | Status: DC

## 2010-12-15 MED ORDER — HYDROCHLOROTHIAZIDE 12.5 MG PO CAPS: 12.5000 mg | ORAL_CAPSULE | Freq: Every day | ORAL | Status: DC

## 2010-12-15 MED ORDER — SILDENAFIL CITRATE 100 MG PO TABS: 100.0000 mg | ORAL_TABLET | Freq: Every day | ORAL | Status: DC | PRN

## 2010-12-15 MED ORDER — METFORMIN HCL 1000 MG PO TABS: 1000.0000 mg | ORAL_TABLET | Freq: Two times a day (BID) | ORAL | Status: DC

## 2010-12-15 MED ORDER — GLIPIZIDE 5 MG PO TABS: 5.0000 mg | ORAL_TABLET | Freq: Two times a day (BID) | ORAL | Status: DC

## 2010-12-15 NOTE — Patient Instructions (Signed)
Continue your current medications.  Walk 30 minutes daily.  Return in 3 months for follow-up.  Lab work one week prior

## 2010-12-15 NOTE — Progress Notes (Signed)
  Subjective:    Patient ID: Robert [name], male    DOB: [birth year], 60 y.o.   MRN: [id number]  HPIben Is a 60 year old, married male cigar smoker, two per week, and comes in today for evaluation of diabetes, hypertension, hyperlipidemia, and erectile dysfunction.  His diabetes is treated with glipizide 5 mg b.i.d., metformin 1000 mg b.i.d., blood sugar 149, with a hemoglobin A1c of 6.8%.  His hypertension is treated with hydrochlorothiazide 12.5 mg daily, lisinopril 40 daily.  BP 124/90.  His hyperlipidemia, history of Crestor 20 daily, but the third goal with an LDL 77.  He continues to smoke cigars,  two per week.     Review of Systems  Constitutional: Negative.   HENT: Negative.   Eyes: Negative.   Respiratory: Negative.   Cardiovascular: Negative.   Gastrointestinal: Negative.   Genitourinary: Negative.   Musculoskeletal: Negative.   Skin: Negative.   Neurological: Negative.   Hematological: Negative.   Psychiatric/Behavioral: Negative.        Objective:   Physical Exam  Constitutional: He is oriented to person, place, and time. He appears well-developed and well-nourished.  HENT:  Head: Normocephalic and atraumatic.  Right Ear: External ear normal.  Left Ear: External ear normal.  Nose: Nose normal.  Mouth/Throat: Oropharynx is clear and moist.  Eyes: Conjunctivae and EOM are normal. Pupils are equal, round, and reactive to light.  Neck: Normal range of motion. Neck supple. No JVD present. No tracheal deviation present. No thyromegaly present.  Cardiovascular: Normal rate, regular rhythm, normal heart sounds and intact distal pulses.  Exam reveals no gallop and no friction rub.   No murmur heard. Pulmonary/Chest: Effort normal and breath sounds normal. No stridor. No respiratory distress. He has no wheezes. He has no rales. He exhibits no tenderness.  Abdominal: Soft. Bowel sounds are normal. He exhibits no distension and no mass. There is no tenderness. There  is no rebound and no guarding.  Genitourinary: Rectum normal, prostate normal and penis normal. Guaiac negative stool. No penile tenderness.  Musculoskeletal: Normal range of motion. He exhibits no edema and no tenderness.  Lymphadenopathy:    He has no cervical adenopathy.  Neurological: He is alert and oriented to person, place, and time. He has normal reflexes. No cranial nerve deficit. He exhibits normal muscle tone.  Skin: Skin is warm and dry. No rash noted. No erythema. No pallor.  Psychiatric: He has a normal mood and affect. His behavior is normal. Judgment and thought content normal.          Assessment & Plan:  Diabetes type II,,,,,,,,,,, continue current medications walk 30 minutes daily.  Follow up A1c in 3 months.   hypertension, under good control,,,,,,,,,,,, continue current therapy.  Hyperlipidemia, under good control continue current therapy.  Erectile dysfunction continue medication

## 2010-12-16 ENCOUNTER — Other Ambulatory Visit: Payer: Self-pay | Admitting: *Deleted

## 2010-12-16 DIAGNOSIS — I10 Essential (primary) hypertension: Secondary | ICD-10-CM

## 2010-12-16 MED ORDER — LISINOPRIL 40 MG PO TABS: ORAL_TABLET | ORAL | Status: DC

## 2010-12-17 ENCOUNTER — Encounter: Payer: Self-pay | Admitting: Family Medicine

## 2010-12-27 ENCOUNTER — Ambulatory Visit (HOSPITAL_COMMUNITY): Payer: BC Managed Care – PPO | Attending: Family Medicine

## 2010-12-27 DIAGNOSIS — R9431 Abnormal electrocardiogram [ECG] [EKG]: Secondary | ICD-10-CM | POA: Insufficient documentation

## 2010-12-27 DIAGNOSIS — E785 Hyperlipidemia, unspecified: Secondary | ICD-10-CM | POA: Insufficient documentation

## 2010-12-27 DIAGNOSIS — E119 Type 2 diabetes mellitus without complications: Secondary | ICD-10-CM | POA: Insufficient documentation

## 2010-12-27 DIAGNOSIS — I10 Essential (primary) hypertension: Secondary | ICD-10-CM | POA: Insufficient documentation

## 2010-12-31 ENCOUNTER — Telehealth: Payer: Self-pay | Admitting: Family Medicine

## 2010-12-31 NOTE — Telephone Encounter (Signed)
Pt called to speak with Dr Tawanna Cooler.... Pt had an echo on Monday and he would like the results of same... Pt adv that no one has conctacted him.... Pt can be reached at [phone].

## 2011-01-01 NOTE — Telephone Encounter (Signed)
Fleet Contras please find the report on the echo.  Please

## 2011-01-03 NOTE — Telephone Encounter (Signed)
found

## 2011-03-08 ENCOUNTER — Other Ambulatory Visit (INDEPENDENT_AMBULATORY_CARE_PROVIDER_SITE_OTHER): Payer: BC Managed Care – PPO

## 2011-03-08 DIAGNOSIS — E119 Type 2 diabetes mellitus without complications: Secondary | ICD-10-CM

## 2011-03-08 LAB — BASIC METABOLIC PANEL
CO2: 29 mEq/L (ref 19–32)
Chloride: 103 mEq/L (ref 96–112)
Glucose, Bld: 151 mg/dL — ABNORMAL HIGH (ref 70–99)
Sodium: 140 mEq/L (ref 135–145)

## 2011-03-11 NOTE — Op Note (Signed)
Robert [name], Robert [name]                        ACCOUNT NO.:  [id number]   MEDICAL RECORD NO.:  [id number]                   PATIENT TYPE:  AMB   LOCATION:  ENDO                                 FACILITY:  [facility]   PHYSICIAN:  Anselmo Rod, M.D.               DATE OF BIRTH:  July 29, 1951   DATE OF PROCEDURE:  12/26/2003  DATE OF DISCHARGE:                                 OPERATIVE REPORT   PROCEDURE:  Colonoscopy with snare polypectomy x5.   ENDOSCOPIST:  Anselmo Rod, M.D.   INSTRUMENT USED:  Olympus video colonoscope.   INDICATIONS FOR PROCEDURE:  A 60 year old white male with a history of  polyps seen on flexible sigmoidoscopy.   PREPROCEDURE PREPARATION:  Informed consent was procured from the patient.  The patient fasted for eight hours prior to the procedure and prepped with a  bottle of magnesium citrate and a gallon of GoLYTELY the night prior to the  procedure.   PREPROCEDURE PHYSICAL:  The patient had stable vital signs. Neck supple.  Chest clear to auscultation. S1, S2 regular. Abdomen soft with normal bowel  sounds.   DESCRIPTION OF PROCEDURE:  The patient was placed in the left lateral  decubitus position and sedated with 100 mg of Demerol and 10 mg of Versed in  slow incremental doses. Once the patient was adequately sedated and  maintained on low flow oxygen and continuous cardiac monitoring, the Olympus  video colonoscope was advanced from the rectum to the cecum. There was a  large amount of residual stool in the colon especially on the right side.  Five polyps were snared, one from the rectum. The polyp from the rectum was  almost ablated and was therefore not retrieved for pathology.  Four polyps  were snared from the left colon, one was lost in stool, three were  retrieved, one polyp was slightly larger than the others and measured about  6 or 7 cm in size, snared from 25 cm.  On retroflexion in the rectum, small  internal hemorrhoids.  The patient  tolerated the procedure well without  complications.   IMPRESSION:  1. Five polyps snared, one from the rectum and four from the left colon,     four retrieved.  2. Small internal hemorrhoids.  3. Normal appearing transverse colon, right colon and cecum.   RECOMMENDATIONS:  1. Await pathology results.  2. Avoid nonsteroidals for now.  3. Outpatient followup in the next two weeks for further recommendations.                                               Anselmo Rod, M.D.    JNM/MEDQ  D:  12/26/2003  T:  12/27/2003  Job:  [id number]   cc:   Broadus John, M.D.  Ashley Royalty, [place]

## 2011-03-11 NOTE — Op Note (Signed)
Robert [name], Robert [name]                      ACCOUNT NO.:  [id number]   MEDICAL RECORD NO.:  [id number]                   PATIENT TYPE:  OBV   LOCATION:  0483                                 FACILITY:  [facility]   PHYSICIAN:  Georges Lynch. Darrelyn Hillock, M.D.             DATE OF BIRTH:  06-Nov-1950   DATE OF PROCEDURE:  04/16/2004  DATE OF DISCHARGE:                                 OPERATIVE REPORT   SURGEON:  Georges Lynch. Darrelyn Hillock, M.D.   ASSISTANT:  Nurse.   PREOPERATIVE DIAGNOSIS:  Complete rupture of the patellar tendon of the left  knee.  Note, he ruptured this Thursday about a week ago in the [place].  I saw him for the first time Tuesday, late in the afternoon  and schedule him for this procedure.   OPERATION:  1. Repair of a complete rupture of the patellar tendon, left knee.  2. GraftJacket tendon graft of the patellar tendon, left knee.  3. Open exploration of the left knee joint.   DESCRIPTION OF PROCEDURE:  Under general anesthesia, the patient first had 1  g of IV Ancef.  A routine orthopedic prep and draping of the left lower  extremity was carried out.  The leg was exsanguinated with Esmarch.  The  tourniquet was elevated at 325 mmHg.  An anterior approach to the knee was  carried out, bleeders identified and cauterized.  Immediately upon going  down into the subcu, there was a knee full of hemarthrosis.  This was  thoroughly suctioned out.  I then went down and examined the patella, the  quadriceps tendon, and the patellar tendon.  There was a complete disruption  of his patellar tendon inferior to the inferior pole of the patella.  Thoroughly irrigated the area out.  I inspected the knee joint.  There were  no loose bodies or any other abnormalities noted.  I then made 2 drill holes  in the inferior border of the patella, brought those up anteriorly.  I then  used #2 Fibrewire and sutured the patella tendon to the patella.  I then  overlapped the remaining  patellar tendon that was remaining proximally and  sutured it distally as well as utilized a 5 x 5 GraftJacket single-thickness  tendon graft.  Thoroughly irrigated out the area and then closed the subcu  with 0 Vicryl, skin with metal staples.  I injected 30 mL of 0.5% Marcaine  with epinephrine into the knee joint.  Sterile Neosporin dressings were  applied.  I then placed him in a knee immobilizer.  The patient left the  operating room in satisfactory condition.                                               Ronald A. Darrelyn Hillock, M.D.    RAG/MEDQ  D:  04/16/2004  T:  04/16/2004  Job:  [id number]

## 2011-03-11 NOTE — H&P (Signed)
NAMEDEMONTA, WOMBLES                      ACCOUNT NO.:  [id number]   MEDICAL RECORD NO.:  [id number]                   PATIENT TYPE:  OBV   LOCATION:  0483                                 FACILITY:  [facility]   PHYSICIAN:  Georges Lynch. Darrelyn Hillock, M.D.             DATE OF BIRTH:  09-20-1951   DATE OF ADMISSION:  04/16/2004  DATE OF DISCHARGE:                                HISTORY & PHYSICAL   REASON FOR ADMISSION:  He was admitted to [facility] today, on  April 16, 2004, for surgical repair and grafting to the rupture of his  patellar tendon on the left.  He was in the [place] one week  ago, he fell and sustained the above-mentioned injuries.  He then came into  my acute care clinic the following Tuesday and he was then admitted to the  hospital and taken to surgery today.   PAST HISTORY:  Basically is uneventful.  He has been in good health except  for a borderline hypertension.   FAMILY HISTORY:  Unremarkable.   SURGICAL HISTORY:  Had history of cosmetic surgery for his eyes in the past.   REVIEW OF SYSTEMS:  As I mentioned, unremarkable except for borderline  hypertension.   PHYSICAL EXAMINATION:  GENERAL:  He is alert and oriented.  VITAL SIGNS:  His blood pressure is 152/89, his pulse 77.  He is afebrile.  HEAD AND NECK:  Negative.  LUNGS:  Clear.  HEART:  Normal size, rhythm, no murmur.  ABDOMEN:  Negative.  UPPER EXTREMITIES:  Normal.  LOWER EXTREMITIES:  The only pertinent positive findings is a complete  rupture of the patellar tendon of the left knee just inferior to the  inferior border of the patella.  Circulation was normal.  His calves were  fine.  No signs of any phlebitis.   X-rays show that his patella on the left was somewhat high-riding, it was  riding a little higher than normal.  There were no fractures.   IMPRESSION:  Complete rupture of patellar tendon on the left.   COMMENT:  He is admitted for surgical repair of the patellar  tendon and a  ________ graft to the tendon.                                               Ronald A. Darrelyn Hillock, M.D.    RAG/MEDQ  D:  04/16/2004  T:  04/17/2004  Job:  [id number]

## 2011-03-14 ENCOUNTER — Ambulatory Visit: Payer: BC Managed Care – PPO | Admitting: Family Medicine

## 2011-03-16 ENCOUNTER — Ambulatory Visit (INDEPENDENT_AMBULATORY_CARE_PROVIDER_SITE_OTHER): Payer: BC Managed Care – PPO | Admitting: Family Medicine

## 2011-03-16 ENCOUNTER — Encounter: Payer: Self-pay | Admitting: Family Medicine

## 2011-03-16 VITALS — BP 130/80 | Temp 98.5°F | Wt 250.0 lb

## 2011-03-16 DIAGNOSIS — E119 Type 2 diabetes mellitus without complications: Secondary | ICD-10-CM

## 2011-03-16 MED ORDER — GLIPIZIDE 5 MG PO TABS: ORAL_TABLET | ORAL | Status: DC

## 2011-03-16 NOTE — Progress Notes (Signed)
  Subjective:    Patient ID: Robert [name], male    DOB: 06-25-1951, 60 y.o.   MRN: [id number]  HPI Robert [name] Is a delightful, 60 year old, married man nonsmoker,,,,,,,,, he quit on the chantix program,,,,,,,, who comes in today for follow-up of diabetes.  One metformin 1000 mg b.i.d. And Glucotrol 5 mg b.i.d. Fasting blood sugar 151 with an A1c is 7.0%.  He continues to follow low-carb diet.  He swims 5 days weekly.    Review of Systems General and metabolic review of systems otherwise negative    Objective:   Physical Exam Well-developed well-nourished, male in no acute distress       Assessment & Plan:  Diabetes type 2, under poor control,,,,,,,, plan increase glipizide to 10 mg in the morning, 5 mg in the p.m. Continue metformin 1000 b.i.d. Follow-up A1c in 3 months.  Continue diet and exercise program

## 2011-03-16 NOTE — Patient Instructions (Signed)
Continue diet and exercise program.  Increase the Glucotrol to 10 mg in the morning and continue the 5-mg dose prior to evening meal.  Also continue the metformin 1000 mg twice daily.  Return in 3 months for follow-up labs one week prior

## 2011-03-18 ENCOUNTER — Other Ambulatory Visit: Payer: Self-pay | Admitting: Family Medicine

## 2011-05-06 ENCOUNTER — Telehealth: Payer: Self-pay | Admitting: Family Medicine

## 2011-05-06 NOTE — Telephone Encounter (Signed)
Pt stated that he was here on 12-15-2010 for a wellness exam and an Ekg was perfromed. However, he stated that he now has a bill for the EKG. Please advise patient.

## 2011-05-11 ENCOUNTER — Telehealth: Payer: Self-pay | Admitting: *Deleted

## 2011-05-11 NOTE — Telephone Encounter (Signed)
Returned call to patient and notified that he was seen at [facility] for an echo on 12-15-10.

## 2011-05-23 ENCOUNTER — Telehealth: Payer: Self-pay | Admitting: *Deleted

## 2011-05-23 NOTE — Telephone Encounter (Signed)
Agree with advice to reduce to one daily and continue close monitoring.

## 2011-05-23 NOTE — Telephone Encounter (Signed)
Pt is taking lisinopril 40 mg 1 1/2 tabs daily.  Pt wanted to confirm his dosage.  His BP over the weekend was 88/60.  He is also taking HCTZ 12.5 mg. Dicussed with Fleet Contras, CMA cause Dr Tawanna Cooler is out of the office today. Pt is to decrease the lisinopril to 1 tablet daily and check BP today and tomorrow and call back with BP readings tomorrow.  Pt agreed

## 2011-05-25 ENCOUNTER — Telehealth: Payer: Self-pay | Admitting: *Deleted

## 2011-05-25 NOTE — Telephone Encounter (Signed)
BP readings on Lisinopril 40 mg.  133/80, 144/80, 128/84, 138/80, 136/82

## 2011-06-16 ENCOUNTER — Other Ambulatory Visit (INDEPENDENT_AMBULATORY_CARE_PROVIDER_SITE_OTHER): Payer: BC Managed Care – PPO

## 2011-06-16 DIAGNOSIS — E119 Type 2 diabetes mellitus without complications: Secondary | ICD-10-CM

## 2011-06-16 LAB — BASIC METABOLIC PANEL
CO2: 29 mEq/L (ref 19–32)
Chloride: 102 mEq/L (ref 96–112)
Potassium: 5.2 mEq/L — ABNORMAL HIGH (ref 3.5–5.1)
Sodium: 137 mEq/L (ref 135–145)

## 2011-06-16 LAB — HEMOGLOBIN A1C: Hgb A1c MFr Bld: 6.7 % — ABNORMAL HIGH (ref 4.6–6.5)

## 2011-06-23 ENCOUNTER — Encounter: Payer: Self-pay | Admitting: Family Medicine

## 2011-06-23 ENCOUNTER — Ambulatory Visit (INDEPENDENT_AMBULATORY_CARE_PROVIDER_SITE_OTHER): Payer: BC Managed Care – PPO | Admitting: Family Medicine

## 2011-06-23 VITALS — BP 142/78 | Temp 99.2°F | Ht 75.0 in | Wt 252.0 lb

## 2011-06-23 DIAGNOSIS — F17200 Nicotine dependence, unspecified, uncomplicated: Secondary | ICD-10-CM

## 2011-06-23 DIAGNOSIS — I499 Cardiac arrhythmia, unspecified: Secondary | ICD-10-CM

## 2011-06-23 DIAGNOSIS — E119 Type 2 diabetes mellitus without complications: Secondary | ICD-10-CM

## 2011-06-23 DIAGNOSIS — I10 Essential (primary) hypertension: Secondary | ICD-10-CM

## 2011-06-23 NOTE — Progress Notes (Signed)
  Subjective:    Patient ID: Robert [name], male    DOB: 1951-08-18, 60 y.o.   MRN: [id number]  HPI  Robert [name]  is a 60 year old male Married,,,,,,,,,, ex smoker.  He quit in April,,,,,,,,, who comes in today for follow-up of 3 problems.  He has diabetes, type 2, and is currently on metformin 1000 mg b.i.d., glipizide, 10 mg prior to breakfast and 5 mg prior to his evening meal.  A1c has dropped to 6.7.  Fasting sugar in the 140 range.  He was on lisinopril 60 mg daily over the summer.  He was outside working.  It was hot and his blood pressure dropped to 78/55.  He states he lost 3 pounds of fluid on that hot day.  He then cut back his lisinopril to 40 mg a day.  BP now running normal diastolic systolic running in the 140 to 150 range.  Recommend he increase the lisinopril back to 60 mg a day.  He states every now and then he is an irregular heartbeat.  It's not associate with exertion.  He has no chest pain or lightheadedness.  He states he had a  recent echocardiogram, which was normal.    Review of Systems General cardiovascular metabolic review of systems otherwise negative    Objective:   Physical Exam  Well-developed well-nourished man in no acute distress.  Examination.  Heart shows the PMI to be in the fifth intercostal space, midclavicular line.  No heaves, or thrills.  S1, S2, within normal limits, as does his illogically split.  I can appreciate no murmurs, rubs, nor gallops.      Assessment & Plan:  Diabetes approaching goal continue current therapy.  Follow-up in 3 months.  Hypertension not at goal increase lisinopril from 40 mg a day to 60.  Monitor blood pressure.  A regular heart rate.  The EKG pending

## 2011-06-23 NOTE — Patient Instructions (Signed)
Continue your current treatment program for your diabetes.  Increase the lisinopril to 60 mg daily and check your blood pressure daily in the morning for 4 weeks to be sure it does not drop too low.  Goal 135/85 or less.  If the skipping should be more sustained or you develop symptoms of lightheadedness, chest pain, etc., then call me.  We will pursue that issue and  Congratulations on stopping smoking!!!!!!!!!!!!!!!!!

## 2011-09-19 ENCOUNTER — Other Ambulatory Visit (INDEPENDENT_AMBULATORY_CARE_PROVIDER_SITE_OTHER): Payer: BC Managed Care – PPO

## 2011-09-19 DIAGNOSIS — E119 Type 2 diabetes mellitus without complications: Secondary | ICD-10-CM

## 2011-09-19 LAB — BASIC METABOLIC PANEL
BUN: 19 mg/dL (ref 6–23)
CO2: 26 mEq/L (ref 19–32)
Chloride: 105 mEq/L (ref 96–112)
Creatinine, Ser: 1 mg/dL (ref 0.4–1.5)
Potassium: 4.6 mEq/L (ref 3.5–5.1)

## 2011-09-19 LAB — HEMOGLOBIN A1C: Hgb A1c MFr Bld: 7.1 % — ABNORMAL HIGH (ref 4.6–6.5)

## 2011-09-26 ENCOUNTER — Ambulatory Visit (INDEPENDENT_AMBULATORY_CARE_PROVIDER_SITE_OTHER): Payer: BC Managed Care – PPO | Admitting: Family Medicine

## 2011-09-26 ENCOUNTER — Encounter: Payer: Self-pay | Admitting: Family Medicine

## 2011-09-26 VITALS — BP 120/84 | Temp 98.7°F | Wt 254.0 lb

## 2011-09-26 DIAGNOSIS — E119 Type 2 diabetes mellitus without complications: Secondary | ICD-10-CM

## 2011-09-26 DIAGNOSIS — Z23 Encounter for immunization: Secondary | ICD-10-CM

## 2011-09-26 NOTE — Patient Instructions (Signed)
Continue your current medications.  Try the low-carb diet.  Follow-up in 3 months for a complete physical examination labs one week prior

## 2011-09-26 NOTE — Progress Notes (Signed)
  Subjective:    Patient ID: Robert [name], male    DOB: 1951-01-18, 60 y.o.   MRN: [id number]  HPI [name]  is a 60 year old Married male, nonsmoker, who comes in today for follow-up of diabetes.  His currently taking glipizide 10 mg in the morning 5 mg prior to his evening meal, and metformin 1000 mg b.i.d.  He exercises 60 minutes 5 days weekly.  Blood sugar 154, A1c7.1.  He has noticed on occasion, when he cut his carpus back.  His blood sugar dropped in the low 70s to 80s.  We discussed various options to get his A1c down.  He elects to try the low-carb diet and continue exercise and current medications.   Review of Systems General and metabolic review of systems otherwise negative   Objective:   Physical Exam Well-developed well-nourished man in acute distress       Assessment & Plan:  Diabetes type II not at goal.  Plan low-carb diet as outlined follow up in 3 months CPX

## 2011-11-17 ENCOUNTER — Other Ambulatory Visit: Payer: Self-pay | Admitting: *Deleted

## 2011-11-17 DIAGNOSIS — I10 Essential (primary) hypertension: Secondary | ICD-10-CM

## 2011-11-17 MED ORDER — LISINOPRIL 40 MG PO TABS: ORAL_TABLET | ORAL | Status: DC

## 2011-12-20 ENCOUNTER — Other Ambulatory Visit: Payer: Self-pay | Admitting: Family Medicine

## 2011-12-20 ENCOUNTER — Other Ambulatory Visit (INDEPENDENT_AMBULATORY_CARE_PROVIDER_SITE_OTHER): Payer: BC Managed Care – PPO

## 2011-12-20 DIAGNOSIS — Z0000 Encounter for general adult medical examination without abnormal findings: Secondary | ICD-10-CM

## 2011-12-20 LAB — CBC WITH DIFFERENTIAL/PLATELET
Basophils Relative: 0.4 % (ref 0.0–3.0)
Eosinophils Relative: 2.5 % (ref 0.0–5.0)
HCT: 46.9 % (ref 39.0–52.0)
Hemoglobin: 15.8 g/dL (ref 13.0–17.0)
Lymphs Abs: 3 10*3/uL (ref 0.7–4.0)
MCV: 91.9 fl (ref 78.0–100.0)
Monocytes Absolute: 0.7 10*3/uL (ref 0.1–1.0)
Neutro Abs: 6.6 10*3/uL (ref 1.4–7.7)
Platelets: 181 10*3/uL (ref 150.0–400.0)
WBC: 10.6 10*3/uL — ABNORMAL HIGH (ref 4.5–10.5)

## 2011-12-20 LAB — POCT URINALYSIS DIPSTICK
Bilirubin, UA: NEGATIVE
Blood, UA: NEGATIVE
Glucose, UA: NEGATIVE
Spec Grav, UA: 1.025
Urobilinogen, UA: 0.2

## 2011-12-20 LAB — HEPATIC FUNCTION PANEL
ALT: 40 U/L (ref 0–53)
Albumin: 4.4 g/dL (ref 3.5–5.2)
Total Bilirubin: 0.7 mg/dL (ref 0.3–1.2)
Total Protein: 6.8 g/dL (ref 6.0–8.3)

## 2011-12-20 LAB — BASIC METABOLIC PANEL
BUN: 17 mg/dL (ref 6–23)
Chloride: 102 mEq/L (ref 96–112)
Potassium: 5.1 mEq/L (ref 3.5–5.1)

## 2011-12-20 LAB — HEMOGLOBIN A1C: Hgb A1c MFr Bld: 7.4 % — ABNORMAL HIGH (ref 4.6–6.5)

## 2011-12-20 LAB — LIPID PANEL: Cholesterol: 169 mg/dL (ref 0–200)

## 2011-12-20 LAB — LDL CHOLESTEROL, DIRECT: Direct LDL: 83.1 mg/dL

## 2011-12-20 LAB — MICROALBUMIN / CREATININE URINE RATIO: Creatinine,U: 263.8 mg/dL

## 2011-12-20 LAB — TSH: TSH: 1.92 u[IU]/mL (ref 0.35–5.50)

## 2011-12-21 ENCOUNTER — Other Ambulatory Visit: Payer: Self-pay | Admitting: Family Medicine

## 2011-12-27 ENCOUNTER — Encounter: Payer: Self-pay | Admitting: Family Medicine

## 2011-12-27 ENCOUNTER — Ambulatory Visit (INDEPENDENT_AMBULATORY_CARE_PROVIDER_SITE_OTHER): Payer: BC Managed Care – PPO | Admitting: Family Medicine

## 2011-12-27 DIAGNOSIS — F528 Other sexual dysfunction not due to a substance or known physiological condition: Secondary | ICD-10-CM

## 2011-12-27 DIAGNOSIS — E119 Type 2 diabetes mellitus without complications: Secondary | ICD-10-CM

## 2011-12-27 DIAGNOSIS — I10 Essential (primary) hypertension: Secondary | ICD-10-CM

## 2011-12-27 DIAGNOSIS — E782 Mixed hyperlipidemia: Secondary | ICD-10-CM

## 2011-12-27 DIAGNOSIS — E785 Hyperlipidemia, unspecified: Secondary | ICD-10-CM

## 2011-12-27 MED ORDER — LISINOPRIL 40 MG PO TABS: ORAL_TABLET | ORAL | Status: DC

## 2011-12-27 MED ORDER — GLIPIZIDE 5 MG PO TABS: ORAL_TABLET | ORAL | Status: DC

## 2011-12-27 MED ORDER — SILDENAFIL CITRATE 100 MG PO TABS: 100.0000 mg | ORAL_TABLET | ORAL | Status: DC | PRN

## 2011-12-27 MED ORDER — HYDROCHLOROTHIAZIDE 12.5 MG PO CAPS: 12.5000 mg | ORAL_CAPSULE | ORAL | Status: DC

## 2011-12-27 MED ORDER — ROSUVASTATIN CALCIUM 20 MG PO TABS: 20.0000 mg | ORAL_TABLET | Freq: Every day | ORAL | Status: DC

## 2011-12-27 MED ORDER — METFORMIN HCL 1000 MG PO TABS: 1000.0000 mg | ORAL_TABLET | Freq: Two times a day (BID) | ORAL | Status: DC

## 2011-12-27 NOTE — Progress Notes (Signed)
  Subjective:    Patient ID: Robert [name], male    DOB: 06-11-1951, 61 y.o.   MRN: [id number]  HPI Robert [name] is a 61 year old married male nonsmoker who comes in today for general physical exam because of a history of diabetes type 2, hypertension, hyperlipidemia, erectile dysfunction, and allergic rhinitis  His medications reviewed in detail and there have been no changes. His weight is steady at 252 pounds 73-1/2 inches tall. He states that he feels well and has no complaints. Hemoglobin A1c is up to 7.4%.  He gets routine eye care, hearing normal, regular dental care, colonoscopy and GI, tetanus 2008, seasonal flu shot 2012, information given on shingles   Review of Systems  Constitutional: Negative.   HENT: Negative.   Eyes: Negative.   Respiratory: Negative.   Cardiovascular: Negative.   Gastrointestinal: Negative.   Genitourinary: Negative.   Musculoskeletal: Negative.   Skin: Negative.   Neurological: Negative.   Hematological: Negative.   Psychiatric/Behavioral: Negative.        Objective:   Physical Exam  Constitutional: He is oriented to person, place, and time. He appears well-developed and well-nourished.  HENT:  Head: Normocephalic and atraumatic.  Right Ear: External ear normal.  Left Ear: External ear normal.  Nose: Nose normal.  Mouth/Throat: Oropharynx is clear and moist.  Eyes: Conjunctivae and EOM are normal. Pupils are equal, round, and reactive to light.  Neck: Normal range of motion. Neck supple. No JVD present. No tracheal deviation present. No thyromegaly present.  Cardiovascular: Normal rate, regular rhythm, normal heart sounds and intact distal pulses.  Exam reveals no gallop and no friction rub.   No murmur heard. Pulmonary/Chest: Effort normal and breath sounds normal. No stridor. No respiratory distress. He has no wheezes. He has no rales. He exhibits no tenderness.  Abdominal: Soft. Bowel sounds are normal. He exhibits no distension and no  mass. There is no tenderness. There is no rebound and no guarding.  Genitourinary: Rectum normal, prostate normal and penis normal. Guaiac negative stool. No penile tenderness.  Musculoskeletal: Normal range of motion. He exhibits no edema and no tenderness.  Lymphadenopathy:    He has no cervical adenopathy.  Neurological: He is alert and oriented to person, place, and time. He has normal reflexes. No cranial nerve deficit. He exhibits normal muscle tone.  Skin: Skin is warm and dry. No rash noted. No erythema. No pallor.  Psychiatric: He has a normal mood and affect. His behavior is normal. Judgment and thought content normal.          Assessment & Plan:  Healthy male  Diabetes type 2 continue current medications but increase exercise followup A1c in 3 months  Hyperlipidemia continue Crestor and aspirin  Hypertension continue Prinzide 60 mg daily  Erectile dysfunction continue Viagra when necessary  A new problem of allergic rhinitis plan Claritin 10 mg daily

## 2011-12-27 NOTE — Patient Instructions (Signed)
Continue your current medications  Begin a 20 minute walk daily to lower your A1c down to 6.5 and your fasting blood sugar around 110  Return in 3 months for followup labs one week prior

## 2012-02-06 ENCOUNTER — Other Ambulatory Visit: Payer: Self-pay | Admitting: Family Medicine

## 2012-03-26 ENCOUNTER — Other Ambulatory Visit: Payer: BC Managed Care – PPO

## 2012-03-29 ENCOUNTER — Ambulatory Visit: Payer: BC Managed Care – PPO | Admitting: Family Medicine

## 2012-04-02 ENCOUNTER — Other Ambulatory Visit (INDEPENDENT_AMBULATORY_CARE_PROVIDER_SITE_OTHER): Payer: BC Managed Care – PPO

## 2012-04-02 DIAGNOSIS — E119 Type 2 diabetes mellitus without complications: Secondary | ICD-10-CM

## 2012-04-02 LAB — BASIC METABOLIC PANEL
Chloride: 103 mEq/L (ref 96–112)
Creatinine, Ser: 0.9 mg/dL (ref 0.4–1.5)

## 2012-04-02 LAB — HEMOGLOBIN A1C: Hgb A1c MFr Bld: 6.4 % (ref 4.6–6.5)

## 2012-04-05 ENCOUNTER — Encounter: Payer: Self-pay | Admitting: Family Medicine

## 2012-04-05 ENCOUNTER — Ambulatory Visit (INDEPENDENT_AMBULATORY_CARE_PROVIDER_SITE_OTHER): Payer: BC Managed Care – PPO | Admitting: Family Medicine

## 2012-04-05 VITALS — BP 120/80 | Temp 99.1°F | Wt 241.0 lb

## 2012-04-05 DIAGNOSIS — E119 Type 2 diabetes mellitus without complications: Secondary | ICD-10-CM

## 2012-04-05 NOTE — Progress Notes (Signed)
  Subjective:    Patient ID: Garret Reddish III, male    DOB: 31-Mar-1951, 61 y.o.   MRN: [id number]  HPI [name] is a 61 year old married male nonsmoker who comes in today for followup of diabetes type 2  He's on metformin 1000 mg twice a day, glipizide 10 mg prior to breakfast 5 mg prior to his evening meal. He's lost 15 pounds in last 4 months with diet and exercise A1c his troponin 7.5 the 6.5. Fasting blood sugar in the 1 07/25/2019 range no hypoglycemia he was to continue diet exercise and weight loss. Had a long discussion and advise what to do with his blood sugar drops too low   Review of Systems    general and metabolic review of systems otherwise negative Objective:   Physical Exam Well-developed well-nourished male in no acute distress weight today 241       Assessment & Plan:

## 2012-04-05 NOTE — Patient Instructions (Signed)
Continue your current medications  If your blood sugar drops too low,,,,,,,,,,,,,, or  is consistently too high,,,,,,,,,,,,, call prior to February  Schedule your annual exam for February labs one week prior

## 2012-06-04 ENCOUNTER — Other Ambulatory Visit: Payer: Self-pay | Admitting: Family Medicine

## 2012-06-14 ENCOUNTER — Telehealth: Payer: Self-pay | Admitting: Family Medicine

## 2012-06-14 MED ORDER — GLUCOSE BLOOD VI STRP: 1.0000 | ORAL_STRIP | Freq: Every day | Status: DC

## 2012-06-14 NOTE — Telephone Encounter (Signed)
Rx sent 

## 2012-06-14 NOTE — Telephone Encounter (Signed)
Pt requesting rx for diabetic test strips. He has been using One Touch Ultra. He is out, and would like a rx to be sent to [facility] on [address], 30-day supply please. He is testing once a week.

## 2012-06-16 ENCOUNTER — Other Ambulatory Visit: Payer: Self-pay | Admitting: Family Medicine

## 2012-11-02 ENCOUNTER — Other Ambulatory Visit: Payer: Self-pay | Admitting: Family Medicine

## 2012-11-13 ENCOUNTER — Telehealth: Payer: Self-pay | Admitting: Family Medicine

## 2012-11-13 DIAGNOSIS — E782 Mixed hyperlipidemia: Secondary | ICD-10-CM

## 2012-11-13 MED ORDER — ROSUVASTATIN CALCIUM 20 MG PO TABS: 20.0000 mg | ORAL_TABLET | Freq: Every day | ORAL | Status: DC

## 2012-11-13 NOTE — Telephone Encounter (Signed)
Patient called stating that he need a refill of his crestor sent to [address]. Please assist.

## 2012-11-19 ENCOUNTER — Telehealth: Payer: Self-pay | Admitting: Family Medicine

## 2012-11-19 NOTE — Telephone Encounter (Signed)
Patient Information:  Caller Name: Albi  Phone: [phone]  Patient: Robert [name]  Gender: Male  DOB: 04-Sep-1951  Age: 62 Years  PCP: Kelle Darting [facility])  Office Follow Up:  Does the office need to follow up with this patient?: Yes  Instructions For The Office: Please check to see if there is less expensive alternative to Crestor 20 mg, please. Thank you.   Symptoms  Reason For Call & Symptoms: Patient reports Crestor 20 mg is expensive and he would like to have less expensive alternative if possible through [employer].  He denies current symptoms, has been out of med approx 3 weeks.  Please follow up regarding his medication question. Thank you.  Reviewed Health History In EMR: Yes  Reviewed Medications In EMR: Yes  Reviewed Allergies In EMR: Yes  Reviewed Surgeries / Procedures: Yes  Date of Onset of Symptoms: Unknown  Guideline(s) Used:  No Protocol Available - Sick Adult  No Protocol Available - Information Only  Disposition Per Guideline:   Discuss with PCP and Callback by Nurse Today  Reason For Disposition Reached:   Nursing judgment  Advice Given:  Call Back If:  New symptoms develop  You become worse.  Call Back If:  New symptoms develop  You become worse.  RN Overrode Recommendation:  Document Patient  Advised caller will check with PCP and get back with him by 11/20/12.

## 2012-11-19 NOTE — Telephone Encounter (Signed)
Robert [name],  Even with a prior Berkley Harvey, it costs him $187. Please advise and call pt re switching to cheaper med.

## 2012-11-20 NOTE — Telephone Encounter (Signed)
I spoke with Mr. Barnier. He will call me back today with some cheaper options through [facility] for Crestor alternative. Will let you know.

## 2012-11-20 NOTE — Telephone Encounter (Signed)
Robert [name] call please call Robert [name],,,,,,,, asked him to call his insurance company and find out what his options are then let us know and we will call in a prescription form

## 2012-11-20 NOTE — Telephone Encounter (Signed)
Robert [name], pt called back. [employer] will cover ATORVASTATIN. He would like to give it a try. ([facility] [address])

## 2012-11-21 MED ORDER — ATORVASTATIN CALCIUM 20 MG PO TABS: 20.0000 mg | ORAL_TABLET | Freq: Every day | ORAL | Status: DC

## 2012-11-21 NOTE — Telephone Encounter (Signed)
lipitor sent to pharmacy and patient is aware

## 2012-11-21 NOTE — Telephone Encounter (Signed)
Okay to send a Rx for Lipitor?

## 2012-12-05 ENCOUNTER — Other Ambulatory Visit: Payer: Self-pay | Admitting: Family Medicine

## 2012-12-05 DIAGNOSIS — E119 Type 2 diabetes mellitus without complications: Secondary | ICD-10-CM

## 2012-12-05 DIAGNOSIS — I10 Essential (primary) hypertension: Secondary | ICD-10-CM

## 2012-12-05 DIAGNOSIS — E785 Hyperlipidemia, unspecified: Secondary | ICD-10-CM

## 2012-12-05 DIAGNOSIS — F528 Other sexual dysfunction not due to a substance or known physiological condition: Secondary | ICD-10-CM

## 2012-12-06 ENCOUNTER — Other Ambulatory Visit: Payer: BC Managed Care – PPO

## 2012-12-07 ENCOUNTER — Other Ambulatory Visit: Payer: BC Managed Care – PPO

## 2012-12-08 ENCOUNTER — Other Ambulatory Visit: Payer: Self-pay | Admitting: Internal Medicine

## 2012-12-11 ENCOUNTER — Other Ambulatory Visit (INDEPENDENT_AMBULATORY_CARE_PROVIDER_SITE_OTHER): Payer: BC Managed Care – PPO

## 2012-12-11 DIAGNOSIS — E119 Type 2 diabetes mellitus without complications: Secondary | ICD-10-CM

## 2012-12-11 DIAGNOSIS — F528 Other sexual dysfunction not due to a substance or known physiological condition: Secondary | ICD-10-CM

## 2012-12-11 DIAGNOSIS — E785 Hyperlipidemia, unspecified: Secondary | ICD-10-CM

## 2012-12-11 DIAGNOSIS — I10 Essential (primary) hypertension: Secondary | ICD-10-CM

## 2012-12-11 LAB — MICROALBUMIN / CREATININE URINE RATIO
Creatinine,U: 152.7 mg/dL
Microalb Creat Ratio: 0.9 mg/g (ref 0.0–30.0)
Microalb, Ur: 1.4 mg/dL (ref 0.0–1.9)

## 2012-12-11 LAB — BASIC METABOLIC PANEL
BUN: 19 mg/dL (ref 6–23)
Chloride: 104 mEq/L (ref 96–112)
GFR: 84.27 mL/min (ref >60.00)
Potassium: 5.2 mEq/L — ABNORMAL HIGH (ref 3.5–5.1)
Sodium: 139 mEq/L (ref 135–145)

## 2012-12-11 LAB — CBC WITH DIFFERENTIAL/PLATELET
Basophils Relative: 0.6 % (ref 0.0–3.0)
Eosinophils Relative: 3 % (ref 0.0–5.0)
HCT: 46.3 % (ref 39.0–52.0)
Hemoglobin: 15.8 g/dL (ref 13.0–17.0)
Lymphs Abs: 3 10*3/uL (ref 0.7–4.0)
MCV: 90.7 fl (ref 78.0–100.0)
Monocytes Absolute: 0.7 10*3/uL (ref 0.1–1.0)
Monocytes Relative: 8 % (ref 3.0–12.0)
Neutro Abs: 4.9 10*3/uL (ref 1.4–7.7)
Platelets: 179 10*3/uL (ref 150.0–400.0)
RBC: 5.11 Mil/uL (ref 4.22–5.81)
WBC: 9 10*3/uL (ref 4.5–10.5)

## 2012-12-11 LAB — POCT URINALYSIS DIPSTICK
Bilirubin, UA: NEGATIVE
Glucose, UA: NEGATIVE
Leukocytes, UA: NEGATIVE
Nitrite, UA: NEGATIVE
pH, UA: 7

## 2012-12-11 LAB — HEPATIC FUNCTION PANEL
ALT: 35 U/L (ref 0–53)
Bilirubin, Direct: 0.2 mg/dL (ref 0.0–0.3)
Total Bilirubin: 0.9 mg/dL (ref 0.3–1.2)

## 2012-12-11 LAB — LDL CHOLESTEROL, DIRECT: Direct LDL: 85.3 mg/dL

## 2012-12-11 LAB — LIPID PANEL
HDL: 32.8 mg/dL — ABNORMAL LOW (ref >39.00)
Total CHOL/HDL Ratio: 5

## 2012-12-27 ENCOUNTER — Encounter: Payer: BC Managed Care – PPO | Admitting: Family Medicine

## 2013-02-04 ENCOUNTER — Encounter: Payer: Self-pay | Admitting: Family Medicine

## 2013-02-04 ENCOUNTER — Ambulatory Visit (INDEPENDENT_AMBULATORY_CARE_PROVIDER_SITE_OTHER): Payer: BC Managed Care – PPO | Admitting: Family Medicine

## 2013-02-04 VITALS — BP 140/84 | Temp 99.3°F | Ht 73.75 in | Wt 252.0 lb

## 2013-02-04 DIAGNOSIS — E119 Type 2 diabetes mellitus without complications: Secondary | ICD-10-CM

## 2013-02-04 DIAGNOSIS — F528 Other sexual dysfunction not due to a substance or known physiological condition: Secondary | ICD-10-CM

## 2013-02-04 DIAGNOSIS — E785 Hyperlipidemia, unspecified: Secondary | ICD-10-CM

## 2013-02-04 DIAGNOSIS — Z85828 Personal history of other malignant neoplasm of skin: Secondary | ICD-10-CM

## 2013-02-04 DIAGNOSIS — I10 Essential (primary) hypertension: Secondary | ICD-10-CM

## 2013-02-04 DIAGNOSIS — G4730 Sleep apnea, unspecified: Secondary | ICD-10-CM

## 2013-02-04 DIAGNOSIS — I499 Cardiac arrhythmia, unspecified: Secondary | ICD-10-CM

## 2013-02-04 MED ORDER — GLIPIZIDE 5 MG PO TABS: ORAL_TABLET | ORAL | Status: DC

## 2013-02-04 MED ORDER — HYDROCHLOROTHIAZIDE 12.5 MG PO CAPS: ORAL_CAPSULE | ORAL | Status: DC

## 2013-02-04 MED ORDER — SILDENAFIL CITRATE 100 MG PO TABS: ORAL_TABLET | ORAL | Status: DC

## 2013-02-04 MED ORDER — METFORMIN HCL 1000 MG PO TABS: ORAL_TABLET | ORAL | Status: DC

## 2013-02-04 MED ORDER — LISINOPRIL 40 MG PO TABS: ORAL_TABLET | ORAL | Status: DC

## 2013-02-04 MED ORDER — ATORVASTATIN CALCIUM 20 MG PO TABS: 20.0000 mg | ORAL_TABLET | Freq: Every day | ORAL | Status: DC

## 2013-02-04 NOTE — Patient Instructions (Signed)
Continue your current medications  Followup in 6 months  Labs nonfasting in one week prior  We will get you set up for see Dr. Mellody Dance for evaluation of possible sleep apnea  In the meantime work hard on the diet exercise and weight loss

## 2013-02-04 NOTE — Progress Notes (Signed)
  Subjective:    Patient ID: Robert [name], male    DOB: [birth year], 62 y.o.   MRN: [id number]  HPIben is a 62 year old married male nonsmoker who comes in today for general physical examination because of a history of hyper lipidemia, diabetes type 2, hypertension, allergic rhinitis, erectile dysfunction  He gets routine eye care, dental care, screening colonoscopies because of a history of colon polyps., Vaccinations up-to-date except he [name] it shingles but he  Review of systems negative except for sleep dysfunction. His wife feels like he has sleep apnea. Weight 252 rather large neck. We will get him set up a consult with Dr. Mellody Dance for further evaluation of that problem.  He's had a history of skin cancer. The going on a two-week sailing trip to the [place]   Review of Systems  Constitutional: Negative.   HENT: Negative.   Eyes: Negative.   Respiratory: Positive for choking.   Cardiovascular: Negative.   Gastrointestinal: Negative.   Genitourinary: Negative.   Musculoskeletal: Negative.   Skin: Negative.   Neurological: Negative.   Psychiatric/Behavioral: Negative.        Objective:   Physical Exam  Constitutional: He is oriented to person, place, and time. He appears well-developed and well-nourished.  HENT:  Head: Normocephalic and atraumatic.  Right Ear: External ear normal.  Left Ear: External ear normal.  Nose: Nose normal.  Mouth/Throat: Oropharynx is clear and moist.  Eyes: Conjunctivae and EOM are normal. Pupils are equal, round, and reactive to light.  Neck: Normal range of motion. Neck supple. No JVD present. No tracheal deviation present. No thyromegaly present.  Cardiovascular: Normal rate, regular rhythm, normal heart sounds and intact distal pulses.  Exam reveals no gallop and no friction rub.   No murmur heard. Pulmonary/Chest: Effort normal and breath sounds normal. No stridor. No respiratory distress. He has no wheezes. He has no rales. He  exhibits no tenderness.  Abdominal: Soft. Bowel sounds are normal. He exhibits no distension and no mass. There is no tenderness. There is no rebound and no guarding.  Genitourinary: Rectum normal, prostate normal and penis normal. Guaiac negative stool. No penile tenderness.  Musculoskeletal: Normal range of motion. He exhibits no edema and no tenderness.  Lymphadenopathy:    He has no cervical adenopathy.  Neurological: He is alert and oriented to person, place, and time. He has normal reflexes. No cranial nerve deficit. He exhibits normal muscle tone.  Skin: Skin is warm and dry. No rash noted. No erythema. No pallor.  Psychiatric: He has a normal mood and affect. His behavior is normal. Judgment and thought content normal.          Assessment & Plan:  Healthy male  A slightly overweight 252 and again encouraged diet exercise and weight loss which would also help the potential sleep apnea  Question sleep apnea consult with Dr. Mellody Dance  Hyperlipidemia continue Lipitor 20 mg daily  Diabetes type 2 continue Glucotrol and metformin  Hypertension continue lisinopril and diuretic  Erectile dysfunction continue Viagra  History colon polyps continue followup in GI  History of skin cancer sunscreens etc.

## 2013-02-05 ENCOUNTER — Ambulatory Visit (INDEPENDENT_AMBULATORY_CARE_PROVIDER_SITE_OTHER): Payer: BC Managed Care – PPO | Admitting: *Deleted

## 2013-02-05 DIAGNOSIS — Z23 Encounter for immunization: Secondary | ICD-10-CM

## 2013-02-05 MED ORDER — HYDROCODONE-HOMATROPINE 5-1.5 MG/5ML PO SYRP: 5.0000 mL | ORAL_SOLUTION | Freq: Three times a day (TID) | ORAL | Status: DC | PRN

## 2013-02-26 ENCOUNTER — Institutional Professional Consult (permissible substitution): Payer: BC Managed Care – PPO | Admitting: Pulmonary Disease

## 2013-03-12 ENCOUNTER — Ambulatory Visit (INDEPENDENT_AMBULATORY_CARE_PROVIDER_SITE_OTHER): Payer: BC Managed Care – PPO | Admitting: Pulmonary Disease

## 2013-03-12 ENCOUNTER — Encounter: Payer: Self-pay | Admitting: Pulmonary Disease

## 2013-03-12 VITALS — BP 150/86 | HR 90 | Temp 98.2°F | Ht 74.0 in | Wt 260.6 lb

## 2013-03-12 DIAGNOSIS — R0683 Snoring: Secondary | ICD-10-CM | POA: Insufficient documentation

## 2013-03-12 DIAGNOSIS — R0989 Other specified symptoms and signs involving the circulatory and respiratory systems: Secondary | ICD-10-CM

## 2013-03-12 DIAGNOSIS — R0609 Other forms of dyspnea: Secondary | ICD-10-CM

## 2013-03-12 NOTE — Progress Notes (Signed)
Subjective:    Patient ID: Robert [name], male    DOB: 1951/03/13, 62 y.o.   MRN: [id number]  HPI The patient is a 62 year old male who I've been asked to see for possible sleep apnea.  The patient has been noted to have loud snoring, and his wife as seen on occasions an abnormal breathing pattern during sleep.  The patient denies choking arousals, and does not have frequent awakenings at night.  He feels that he is rested at least 50% in the mornings, and denies any issues with inappropriate daytime sleepiness even with inactivity.  He denies any issues watching television or movies in the evenings, and has only rare sleepiness driving longer distances.  The patient states that his weight is neutral over the last 2 years, and his Epworth score today is only 8.   Sleep Questionnaire What time do you typically go to bed?( Between what hours) 10-11 10-11 at 1028 on 03/12/13 by Nita Sells, CMA How long does it take you to fall asleep?  at 1028 on 03/12/13 by Marjo Bicker Mabe, CMA How many times during the night do you wake up? 1 1 at 1028 on 03/12/13 by Nita Sells, CMA What time do you get out of bed to start your day? [id number] at 1028 on 03/12/13 by Nita Sells, CMA Do you drive or operate heavy machinery in your occupation? No No at 1028 on 03/12/13 by Nita Sells, CMA How much has your weight changed (up or down) over the past two years? (In pounds) No Value varies---15lbs down then 20lb up at 1028 on 03/12/13 by Nita Sells, CMA Have you ever had a sleep study before? No No at 1028 on 03/12/13 by Marjo Bicker Mabe, CMA Do you currently use CPAP? No No at 1028 on 03/12/13 by Marjo Bicker Mabe, CMA Do you wear oxygen at any time? No No at 1028 on 03/12/13 by Marjo Bicker Mabe, CMA   Review of Systems  Constitutional: Negative for fever and unexpected weight change.  HENT: Positive for sneezing and dental problem. Negative for ear pain, nosebleeds,  congestion, sore throat, rhinorrhea, trouble swallowing, postnasal drip and sinus pressure.   Eyes: Negative for redness and itching.  Respiratory: Negative for cough, chest tightness, shortness of breath and wheezing.   Cardiovascular: Negative for palpitations and leg swelling.  Gastrointestinal: Negative for nausea and vomiting.  Genitourinary: Negative for dysuria.  Musculoskeletal: Negative for joint swelling.  Skin: Negative for rash.  Neurological: Negative for headaches.  Hematological: Does not bruise/bleed easily.  Psychiatric/Behavioral: Negative for dysphoric mood. The patient is not nervous/anxious.        Objective:   Physical Exam Constitutional:  Obese male, no acute distress  HENT:  Nares patent without discharge, +enlarged turbinates.  Oropharynx without exudate, palate and uvula are thick and elongated.   Eyes:  Perrla, eomi, no scleral icterus  Neck:  No JVD, no TMG  Cardiovascular:  Normal rate, regular rhythm, no rubs or gallops.  No murmurs        Intact distal pulses  Pulmonary :  Normal breath sounds, no stridor or respiratory distress   No rales, rhonchi, or wheezing  Abdominal:  Soft, nondistended, bowel sounds present.  No tenderness noted.   Musculoskeletal:  No lower extremity edema noted.  Lymph Nodes:  No cervical lymphadenopathy noted  Skin:  No cyanosis noted  Neurologic:  Alert, appropriate, moves all 4 extremities without obvious deficit.  Assessment & Plan:

## 2013-03-12 NOTE — Patient Instructions (Addendum)
Work on aggressive weight loss over the next 6mos, but call before then if your symptoms are bothersome or you are not losing weight successfully.  Will set you up for a sleep study at that point.

## 2013-03-12 NOTE — Assessment & Plan Note (Signed)
It is hard to know from the pt's history how much of this is symptomatic snoring vs sleep apnea.  He certainly is at risk with body habitus and his wife's observation, but he does not feel his QOL is really being impacted during the day.  He does have DM and HTN which can be affected by OSA, but I would be willing to give him 6mos to work aggressively on weight loss since he has been successful with this in the past.  We have struck a deal where he will work on weight loss over the next 6mos, and he is to call me if having difficulties or increased symptoms.  If he has not lost at least 25 pounds in next 6mos, he is willing to have a sleep study.

## 2013-03-26 ENCOUNTER — Other Ambulatory Visit: Payer: Self-pay | Admitting: Family Medicine

## 2013-05-26 ENCOUNTER — Other Ambulatory Visit: Payer: Self-pay | Admitting: Family Medicine

## 2013-07-30 ENCOUNTER — Other Ambulatory Visit (INDEPENDENT_AMBULATORY_CARE_PROVIDER_SITE_OTHER): Payer: BC Managed Care – PPO

## 2013-07-30 DIAGNOSIS — E119 Type 2 diabetes mellitus without complications: Secondary | ICD-10-CM

## 2013-07-30 LAB — BASIC METABOLIC PANEL
Calcium: 9.3 mg/dL (ref 8.4–10.5)
GFR: 95.48 mL/min (ref >60.00)
Potassium: 5.1 mEq/L (ref 3.5–5.1)
Sodium: 142 mEq/L (ref 135–145)

## 2013-07-30 LAB — HEMOGLOBIN A1C: Hgb A1c MFr Bld: 7.3 % — ABNORMAL HIGH (ref 4.6–6.5)

## 2013-08-06 ENCOUNTER — Encounter: Payer: Self-pay | Admitting: Family Medicine

## 2013-08-06 ENCOUNTER — Ambulatory Visit (INDEPENDENT_AMBULATORY_CARE_PROVIDER_SITE_OTHER): Payer: BC Managed Care – PPO | Admitting: Family Medicine

## 2013-08-06 VITALS — BP 150/98 | Temp 98.5°F | Wt 252.0 lb

## 2013-08-06 DIAGNOSIS — Z23 Encounter for immunization: Secondary | ICD-10-CM

## 2013-08-06 DIAGNOSIS — E119 Type 2 diabetes mellitus without complications: Secondary | ICD-10-CM

## 2013-08-06 DIAGNOSIS — J069 Acute upper respiratory infection, unspecified: Secondary | ICD-10-CM | POA: Insufficient documentation

## 2013-08-06 MED ORDER — HYDROCODONE-HOMATROPINE 5-1.5 MG/5ML PO SYRP: 5.0000 mL | ORAL_SOLUTION | Freq: Three times a day (TID) | ORAL | Status: DC | PRN

## 2013-08-06 NOTE — Progress Notes (Signed)
  Subjective:    Patient ID: Robert [name], male    DOB: 06-Mar-1951, 62 y.o.   MRN: [id number]  HPI Robert [name] is a 62 year old married male nonsmoker who comes in today for followup of diabetes and also chest and head congestion for 1 week  He's on Glucotrol 10 mg before breakfast 5 mg prior to his evening meal and metformin 1000 mg 3 times a day recent blood sugar 152 A1c was 6.99 7.3.  [place] [employer] [place]  He's had head congestion sore throat cough nonproductive for a week. No fever sleeping okay   Review of Systems    review of systems otherwise negative Objective:   Physical Exam Well-developed and nourished male no acute distress HEENT negative neck was supple no adenopathy lungs are clear  Foot exam pulses normal skin normal male normal no neuropathy       Assessment & Plan:  Diabetes type 2 not at target discussed treatment options patient likes to decrease his caloric intake and increase his exercise program. Monitor blood sugar followup in 3 months  Viral syndrome treat symptomatically

## 2013-08-06 NOTE — Patient Instructions (Signed)
Let's keep you medication the same. Work hard on diet and exercise  Fasting blood sugar Monday Wednesday Friday  Followup office visit in 3 months A1c one week prior  If however over the next couple months you see your blood sugar going higher call sooner  Hydromet 1/2-1 teaspoon each bedtime when necessary for cough and cold

## 2013-09-02 ENCOUNTER — Telehealth: Payer: Self-pay | Admitting: *Deleted

## 2013-09-02 NOTE — Telephone Encounter (Signed)
Patient is calling because his wife will need a hearing aid and would like to know where they should go?

## 2013-09-03 NOTE — Telephone Encounter (Signed)
Spoke with husband.  They will go to [facility].

## 2013-09-03 NOTE — Telephone Encounter (Signed)
[facility]

## 2013-09-10 ENCOUNTER — Ambulatory Visit: Payer: BC Managed Care – PPO | Admitting: Pulmonary Disease

## 2013-10-01 ENCOUNTER — Other Ambulatory Visit: Payer: Self-pay | Admitting: Family Medicine

## 2013-11-06 ENCOUNTER — Other Ambulatory Visit (INDEPENDENT_AMBULATORY_CARE_PROVIDER_SITE_OTHER): Payer: BC Managed Care – PPO

## 2013-11-06 DIAGNOSIS — E119 Type 2 diabetes mellitus without complications: Secondary | ICD-10-CM

## 2013-11-06 LAB — BASIC METABOLIC PANEL
BUN: 14 mg/dL (ref 6–23)
CHLORIDE: 104 meq/L (ref 96–112)
CO2: 27 mEq/L (ref 19–32)
Calcium: 9.1 mg/dL (ref 8.4–10.5)
Creatinine, Ser: 1 mg/dL (ref 0.4–1.5)
GFR: 80.16 mL/min (ref >60.00)
Glucose, Bld: 191 mg/dL — ABNORMAL HIGH (ref 70–99)
POTASSIUM: 4.5 meq/L (ref 3.5–5.1)
SODIUM: 137 meq/L (ref 135–145)

## 2013-11-06 LAB — HEMOGLOBIN A1C: HEMOGLOBIN A1C: 8.3 % — AB (ref 4.6–6.5)

## 2013-11-13 ENCOUNTER — Other Ambulatory Visit: Payer: Self-pay | Admitting: Family Medicine

## 2013-11-13 ENCOUNTER — Ambulatory Visit: Payer: BC Managed Care – PPO | Admitting: Family Medicine

## 2013-11-15 ENCOUNTER — Other Ambulatory Visit: Payer: Self-pay | Admitting: Family Medicine

## 2013-11-18 ENCOUNTER — Encounter: Payer: Self-pay | Admitting: Family Medicine

## 2013-11-18 ENCOUNTER — Ambulatory Visit (INDEPENDENT_AMBULATORY_CARE_PROVIDER_SITE_OTHER): Payer: BC Managed Care – PPO | Admitting: Family Medicine

## 2013-11-18 VITALS — BP 130/80 | Temp 98.4°F | Wt 250.0 lb

## 2013-11-18 DIAGNOSIS — E119 Type 2 diabetes mellitus without complications: Secondary | ICD-10-CM

## 2013-11-18 DIAGNOSIS — F528 Other sexual dysfunction not due to a substance or known physiological condition: Secondary | ICD-10-CM

## 2013-11-18 MED ORDER — SILDENAFIL CITRATE 100 MG PO TABS: 100.0000 mg | ORAL_TABLET | ORAL | Status: DC | PRN

## 2013-11-18 MED ORDER — GLIPIZIDE 10 MG PO TABS: 10.0000 mg | ORAL_TABLET | Freq: Two times a day (BID) | ORAL | Status: DC

## 2013-11-18 NOTE — Progress Notes (Signed)
   Subjective:    Patient ID: [name], male    DOB: 1951-05-04, 63 y.o.   MRN: [id number]  HPI Mr. Delsol is a 63 year old married male nonsmoker who comes in today for followup of diabetes type 2  He's on 1000 mg metformin twice a day 10 mg of glipizide in the morning and 5 mg before his evening meal. Fasting blood sugar up to 191 A1c 8.3%. He works at the [employer] 3-5 days weekly swimming weight is steady at 250 pounds   Review of Systems Review of systems negative    Objective:   Physical Exam  Well-developed and nourished male no acute distress vital signs stable he is afebrile BP 130/80      Assessment & Plan:  Diabetes type 2 not at goal,,,,,,,,,, intensify diet and exercise program,,,,,,, continue metformin 1000 twice a day,,,,,,,,,, increase glipizide to 10 mg twice a day,,,,,,,, followup in 3 months,,,,,,,,,, started insulin when necessary

## 2013-11-18 NOTE — Progress Notes (Signed)
Pre visit review using our clinic review tool, if applicable. No additional management support is needed unless otherwise documented below in the visit note. 

## 2013-11-18 NOTE — Patient Instructions (Signed)
Glipizide 10 mg,,,,,,,,,,, one twice daily  Metformin 1000 mg..... one twice daily  Check a fasting blood sugar daily in the morning  Continue your exercise program......... the days you don't swim walk 30 minutes  Followup in 3 months  Labs one week prior

## 2013-11-21 ENCOUNTER — Telehealth: Payer: Self-pay

## 2013-11-21 NOTE — Telephone Encounter (Signed)
Relevant patient education assigned to patient using Emmi. ° °

## 2014-01-16 ENCOUNTER — Other Ambulatory Visit: Payer: Self-pay | Admitting: Family Medicine

## 2014-01-30 ENCOUNTER — Other Ambulatory Visit: Payer: Self-pay | Admitting: Family Medicine

## 2014-01-30 ENCOUNTER — Encounter: Payer: Self-pay | Admitting: Internal Medicine

## 2014-02-10 ENCOUNTER — Other Ambulatory Visit (INDEPENDENT_AMBULATORY_CARE_PROVIDER_SITE_OTHER): Payer: BC Managed Care – PPO

## 2014-02-10 DIAGNOSIS — Z0000 Encounter for general adult medical examination without abnormal findings: Secondary | ICD-10-CM

## 2014-02-10 LAB — CBC WITH DIFFERENTIAL/PLATELET
BASOS PCT: 0.4 % (ref 0.0–3.0)
Basophils Absolute: 0 10*3/uL (ref 0.0–0.1)
EOS PCT: 2.1 % (ref 0.0–5.0)
Eosinophils Absolute: 0.2 10*3/uL (ref 0.0–0.7)
HCT: 45.6 % (ref 39.0–52.0)
Hemoglobin: 15.7 g/dL (ref 13.0–17.0)
Lymphocytes Relative: 36.8 % (ref 12.0–46.0)
Lymphs Abs: 3.5 10*3/uL (ref 0.7–4.0)
MCHC: 34.3 g/dL (ref 30.0–36.0)
MCV: 91.2 fl (ref 78.0–100.0)
Monocytes Absolute: 0.7 10*3/uL (ref 0.1–1.0)
Monocytes Relative: 7 % (ref 3.0–12.0)
NEUTROS PCT: 53.7 % (ref 43.0–77.0)
Neutro Abs: 5.1 10*3/uL (ref 1.4–7.7)
PLATELETS: 178 10*3/uL (ref 150.0–400.0)
RBC: 5.01 Mil/uL (ref 4.22–5.81)
RDW: 13.1 % (ref 11.5–14.6)
WBC: 9.6 10*3/uL (ref 4.5–10.5)

## 2014-02-10 LAB — HEPATIC FUNCTION PANEL
ALK PHOS: 57 U/L (ref 39–117)
ALT: 34 U/L (ref 0–53)
AST: 22 U/L (ref 0–37)
Albumin: 4 g/dL (ref 3.5–5.2)
BILIRUBIN TOTAL: 0.7 mg/dL (ref 0.3–1.2)
Bilirubin, Direct: 0.1 mg/dL (ref 0.0–0.3)
TOTAL PROTEIN: 6.4 g/dL (ref 6.0–8.3)

## 2014-02-10 LAB — POCT URINALYSIS DIPSTICK
BILIRUBIN UA: NEGATIVE
Glucose, UA: NEGATIVE
Ketones, UA: NEGATIVE
Leukocytes, UA: NEGATIVE
NITRITE UA: NEGATIVE
RBC UA: NEGATIVE
Spec Grav, UA: >=1.03
UROBILINOGEN UA: 0.2
pH, UA: 5.5

## 2014-02-10 LAB — LIPID PANEL
CHOLESTEROL: 156 mg/dL (ref 0–200)
HDL: 32.9 mg/dL — AB (ref >39.00)
LDL Cholesterol: 62 mg/dL (ref 0–99)
TRIGLYCERIDES: 305 mg/dL — AB (ref 0.0–149.0)
Total CHOL/HDL Ratio: 5
VLDL: 61 mg/dL — AB (ref 0.0–40.0)

## 2014-02-10 LAB — TSH: TSH: 1.42 u[IU]/mL (ref 0.35–5.50)

## 2014-02-10 LAB — MICROALBUMIN / CREATININE URINE RATIO
Creatinine,U: 229.7 mg/dL
Microalb Creat Ratio: 0.8 mg/g (ref 0.0–30.0)
Microalb, Ur: 1.8 mg/dL (ref 0.0–1.9)

## 2014-02-10 LAB — BASIC METABOLIC PANEL
BUN: 20 mg/dL (ref 6–23)
CALCIUM: 9.1 mg/dL (ref 8.4–10.5)
CO2: 26 mEq/L (ref 19–32)
CREATININE: 0.9 mg/dL (ref 0.4–1.5)
Chloride: 104 mEq/L (ref 96–112)
GFR: 89.3 mL/min (ref >60.00)
Glucose, Bld: 177 mg/dL — ABNORMAL HIGH (ref 70–99)
Potassium: 4.8 mEq/L (ref 3.5–5.1)
Sodium: 139 mEq/L (ref 135–145)

## 2014-02-10 LAB — HEMOGLOBIN A1C: Hgb A1c MFr Bld: 7.7 % — ABNORMAL HIGH (ref 4.6–6.5)

## 2014-02-10 LAB — PSA: PSA: 1.91 ng/mL (ref 0.10–4.00)

## 2014-02-17 ENCOUNTER — Ambulatory Visit (INDEPENDENT_AMBULATORY_CARE_PROVIDER_SITE_OTHER): Payer: BC Managed Care – PPO | Admitting: Family Medicine

## 2014-02-17 ENCOUNTER — Encounter: Payer: Self-pay | Admitting: Family Medicine

## 2014-02-17 VITALS — BP 120/70 | Temp 98.5°F | Ht 73.75 in | Wt 254.0 lb

## 2014-02-17 DIAGNOSIS — I10 Essential (primary) hypertension: Secondary | ICD-10-CM

## 2014-02-17 DIAGNOSIS — E119 Type 2 diabetes mellitus without complications: Secondary | ICD-10-CM

## 2014-02-17 DIAGNOSIS — E785 Hyperlipidemia, unspecified: Secondary | ICD-10-CM

## 2014-02-17 DIAGNOSIS — Z85828 Personal history of other malignant neoplasm of skin: Secondary | ICD-10-CM

## 2014-02-17 DIAGNOSIS — F528 Other sexual dysfunction not due to a substance or known physiological condition: Secondary | ICD-10-CM

## 2014-02-17 MED ORDER — LISINOPRIL 40 MG PO TABS: ORAL_TABLET | ORAL | Status: DC

## 2014-02-17 MED ORDER — METFORMIN HCL 1000 MG PO TABS: ORAL_TABLET | ORAL | Status: DC

## 2014-02-17 MED ORDER — GLIPIZIDE 10 MG PO TABS: 10.0000 mg | ORAL_TABLET | Freq: Two times a day (BID) | ORAL | Status: DC

## 2014-02-17 MED ORDER — ATORVASTATIN CALCIUM 20 MG PO TABS: 20.0000 mg | ORAL_TABLET | Freq: Every day | ORAL | Status: DC

## 2014-02-17 MED ORDER — HYDROCHLOROTHIAZIDE 12.5 MG PO CAPS: ORAL_CAPSULE | ORAL | Status: DC

## 2014-02-17 MED ORDER — SILDENAFIL CITRATE 100 MG PO TABS: 100.0000 mg | ORAL_TABLET | ORAL | Status: DC | PRN

## 2014-02-17 MED ORDER — INSULIN LISPRO PROT & LISPRO (75-25 MIX) 100 UNIT/ML ~~LOC~~ SUSP: 10.0000 [IU] | SUBCUTANEOUS | Status: DC

## 2014-02-17 NOTE — Patient Instructions (Signed)
Insulin 7525.........[name] Kitchen 10 units at bedtime  For the first 2 weeks check your blood sugar daily in the morning.........[name] Kitchen benefits normal then just check it 3 times weekly. If after 2 weeks it's not normal call and leave a voicemail with Apolonio Schneiders and we will adjust her dose by phone  Followup in 3 months  Labs one week prior  Continue your other medications.

## 2014-02-17 NOTE — Progress Notes (Signed)
Pre visit review using our clinic review tool, if applicable. No additional management support is needed unless otherwise documented below in the visit note. 

## 2014-02-17 NOTE — Progress Notes (Signed)
   Subjective:    Patient ID: Robert [name], male    DOB: 08-16-1951, 63 y.o.   MRN: [id number]  HPI Robert [name] is a 63 year old married male nonsmoker who comes in today for annual physical examination because of a history of hyperlipidemia, diabetes type 2, hypertension, erectile dysfunction  His meds reviewed the been no changes. He's maxed out his oral medications. His A1c despite his best efforts at diet and exercise is 7.7%. Fasting blood sugar was 177.  Was started insulin  He gets routine eye care, dental care,,,,,,,,, recently had a tooth pulled,,,,, colonoscopy and GI. He's due for followup colonoscopy he recently got a letter  Vaccinations updated by Apolonio Schneiders   Review of Systems  Constitutional: Negative.   HENT: Negative.   Eyes: Negative.   Respiratory: Negative.   Cardiovascular: Negative.   Gastrointestinal: Negative.   Genitourinary: Negative.   Musculoskeletal: Negative.   Skin: Negative.   Neurological: Negative.   Psychiatric/Behavioral: Negative.        Objective:   Physical Exam  Nursing note and vitals reviewed. Constitutional: He is oriented to person, place, and time. He appears well-developed and well-nourished.  HENT:  Head: Normocephalic and atraumatic.  Right Ear: External ear normal.  Left Ear: External ear normal.  Nose: Nose normal.  Mouth/Throat: Oropharynx is clear and moist.  Eyes: Conjunctivae and EOM are normal. Pupils are equal, round, and reactive to light.  Neck: Normal range of motion. Neck supple. No JVD present. No tracheal deviation present. No thyromegaly present.  Cardiovascular: Normal rate, regular rhythm, normal heart sounds and intact distal pulses.  Exam reveals no gallop and no friction rub.   No murmur heard. No carotid or artery bruits peripheral pulses 2+ and symmetrical  Pulmonary/Chest: Effort normal and breath sounds normal. No stridor. No respiratory distress. He has no wheezes. He has no rales. He exhibits no  tenderness.  Abdominal: Soft. Bowel sounds are normal. He exhibits no distension and no mass. There is no tenderness. There is no rebound and no guarding.  Genitourinary: Rectum normal and penis normal. Guaiac negative stool. No penile tenderness.  1+ symmetrical BPH  Musculoskeletal: Normal range of motion. He exhibits no edema and no tenderness.  Lymphadenopathy:    He has no cervical adenopathy.  Neurological: He is alert and oriented to person, place, and time. He has normal reflexes. No cranial nerve deficit. He exhibits normal muscle tone.  Skin: Skin is warm and dry. No rash noted. No erythema. No pallor.  Total body skin exam normal  Psychiatric: He has a normal mood and affect. His behavior is normal. Judgment and thought content normal.          Assessment & Plan:  Healthy male  Diabetes type 2 maxed out with oral medications start insulin 10 units at bedtime followup A1c in 3 months  Hypertension at goal continue current therapy  Hyperlipidemia not at goal because of elevated blood sugar  Erectile dysfunction continue Viagra

## 2014-02-18 ENCOUNTER — Telehealth: Payer: Self-pay | Admitting: Family Medicine

## 2014-02-18 NOTE — Telephone Encounter (Signed)
Relevant patient education assigned to patient using Emmi. ° °

## 2014-02-20 ENCOUNTER — Telehealth: Payer: Self-pay | Admitting: Family Medicine

## 2014-02-20 NOTE — Telephone Encounter (Signed)
Left message on machine for patient to return our call 

## 2014-02-20 NOTE — Telephone Encounter (Signed)
[employer] Petronila denied [place] request for Humalog.  Humalog is covered for members with a documented adverse reaction, intolerance, or FDA-labeled contraindication to preferred insulin (Novolin, Novolog) products.

## 2014-03-04 MED ORDER — INSULIN ASPART PROT & ASPART (70-30 MIX) 100 UNIT/ML ~~LOC~~ SUSP: 10.0000 [IU] | Freq: Every day | SUBCUTANEOUS | Status: DC

## 2014-03-04 NOTE — Telephone Encounter (Signed)
New Rx sent and patient is aware. 

## 2014-03-13 ENCOUNTER — Telehealth: Payer: Self-pay | Admitting: *Deleted

## 2014-03-13 NOTE — Telephone Encounter (Signed)
Patient has changed his diet.  However his glucose at night has been around 79 so he is not taking insulin.  He will continue taking his oral medication and monitor his glucose. Please advise.

## 2014-03-18 NOTE — Telephone Encounter (Signed)
Per Dr Sherren Mocha,  Continue to monitor glucose levels and oral medications.  Follow up with A1c as scheduled.  Patient is aware.

## 2014-05-12 ENCOUNTER — Other Ambulatory Visit (INDEPENDENT_AMBULATORY_CARE_PROVIDER_SITE_OTHER): Payer: BC Managed Care – PPO

## 2014-05-12 DIAGNOSIS — E119 Type 2 diabetes mellitus without complications: Secondary | ICD-10-CM

## 2014-05-12 LAB — BASIC METABOLIC PANEL
BUN: 19 mg/dL (ref 6–23)
CHLORIDE: 102 meq/L (ref 96–112)
CO2: 29 meq/L (ref 19–32)
Calcium: 9.6 mg/dL (ref 8.4–10.5)
Creatinine, Ser: 1 mg/dL (ref 0.4–1.5)
GFR: 80.96 mL/min (ref >60.00)
Glucose, Bld: 148 mg/dL — ABNORMAL HIGH (ref 70–99)
POTASSIUM: 4.8 meq/L (ref 3.5–5.1)
Sodium: 139 mEq/L (ref 135–145)

## 2014-05-12 LAB — HEMOGLOBIN A1C: HEMOGLOBIN A1C: 6.5 % (ref 4.6–6.5)

## 2014-05-19 ENCOUNTER — Ambulatory Visit: Payer: BC Managed Care – PPO | Admitting: Family Medicine

## 2014-05-29 ENCOUNTER — Ambulatory Visit (INDEPENDENT_AMBULATORY_CARE_PROVIDER_SITE_OTHER): Payer: BC Managed Care – PPO | Admitting: Family Medicine

## 2014-05-29 ENCOUNTER — Encounter: Payer: Self-pay | Admitting: Family Medicine

## 2014-05-29 ENCOUNTER — Telehealth: Payer: Self-pay | Admitting: Family Medicine

## 2014-05-29 VITALS — BP 130/84 | Temp 99.1°F | Wt 246.0 lb

## 2014-05-29 DIAGNOSIS — E119 Type 2 diabetes mellitus without complications: Secondary | ICD-10-CM

## 2014-05-29 NOTE — Patient Instructions (Signed)
Continue your current medications  Only take insulin is needed............ 6 units at bedtime when necessary  Followup in March for your annual physical examination  Labs one week prior  Continue good diet and exercise

## 2014-05-29 NOTE — Telephone Encounter (Signed)
Per avs dr. Sherren Mocha requesting pt to have physical done march 2016, pt last physical was 02/17/14, based on insurance pt would like to know what dr. Sherren Mocha would like for him to do, and how to schedule the appt.

## 2014-05-29 NOTE — Progress Notes (Signed)
   Subjective:    Patient ID: [name], male    DOB: 1951-05-25, 63 y.o.   MRN: [id number]  HPI [name] is a 63 year old male who comes in today for followup of diabetes  He was on glipizide 10 mg twice a day and metformin 1000 mg twice a day and his A1c was elevated at 8.3. We therefore started him and insulin 10 units at bedtime last when her however with change in diet increase exercise and weight loss he stopped his insulin and now on oral medicine alone his A1c is 6.5%      Review of Systems Review of systems otherwise negative    Objective:   Physical Exam  Well-developed well-nourished male no acute distress vital signs stable he is afebrile BP 130/84      Assessment & Plan:  Diabetes type 2 at goal,,,,,,,,,,, continue oral medications,,,, monitor fasting blood sugar,,,,,,, followup in 6 months,,

## 2014-05-29 NOTE — Progress Notes (Signed)
Pre visit review using our clinic review tool, if applicable. No additional management support is needed unless otherwise documented below in the visit note. Lab Results  Component Value Date   HGBA1C 6.5 05/12/2014   HGBA1C 7.7* 02/10/2014   HGBA1C 8.3* 11/06/2013   Lab Results  Component Value Date   MICROALBUR 1.8 02/10/2014   LDLCALC 62 02/10/2014   CREATININE 1.0 05/12/2014

## 2014-06-02 NOTE — Telephone Encounter (Signed)
Patient is aware that he will have to schedule is appointment in April.

## 2014-07-10 ENCOUNTER — Encounter: Payer: Self-pay | Admitting: Internal Medicine

## 2014-10-15 ENCOUNTER — Ambulatory Visit (INDEPENDENT_AMBULATORY_CARE_PROVIDER_SITE_OTHER): Payer: BC Managed Care – PPO | Admitting: Internal Medicine

## 2014-10-15 ENCOUNTER — Telehealth: Payer: Self-pay | Admitting: Family Medicine

## 2014-10-15 ENCOUNTER — Encounter: Payer: Self-pay | Admitting: Internal Medicine

## 2014-10-15 VITALS — BP 138/86 | HR 73 | Temp 97.6°F | Ht 73.0 in | Wt 250.4 lb

## 2014-10-15 DIAGNOSIS — J069 Acute upper respiratory infection, unspecified: Secondary | ICD-10-CM

## 2014-10-15 DIAGNOSIS — H109 Unspecified conjunctivitis: Secondary | ICD-10-CM

## 2014-10-15 MED ORDER — GENTAMICIN SULFATE 0.3 % OP SOLN: 2.0000 [drp] | Freq: Three times a day (TID) | OPHTHALMIC | Status: DC

## 2014-10-15 NOTE — Telephone Encounter (Signed)
noted 

## 2014-10-15 NOTE — Telephone Encounter (Signed)
Pt wants to thank you for advise you gave on his eye issue. Pt states things, and he, are much better.  Thank you!!

## 2014-10-15 NOTE — Progress Notes (Signed)
Pre visit review using our clinic review tool, if applicable. No additional management support is needed unless otherwise documented below in the visit note. 

## 2014-10-15 NOTE — Progress Notes (Signed)
Subjective:    Patient ID: Robert [name], male    DOB: Feb 08, 1951, 63 y.o.   MRN: [id number]  DOS:  10/15/2014 Type of visit - description : acute Interval history: Eye irritation started yesterday on the left eye, now to some degree on the right. He had thick discharge at the corner of the left eye yesterday. vision is normal, mild itching, no actual eye pain. Also started with URI a days ago, + postnasal dripping and a dry cough   ROS No fever chills. No shortness of breath No nausea, vomiting, diarrhea  Past Medical History  Diagnosis Date  . HLD (hyperlipidemia)   . HTN (hypertension)   . Diabetes mellitus type II   . Skin cancer     history of  . Hx of colonic polyps     Past Surgical History  Procedure Laterality Date  . Patellar tendon repair      Left  . Removal of excess tissue      Right and Left upper eyelids    History   Social History  . Marital Status: Married    Spouse Name: N/A    Number of Children: N/A  . 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 Smoking status: Former Smoker    Types: Cigars    Quit date: 02/22/2011  . Smokeless tobacco: Not on file     Comment: 2 cigars monthly or less  . Alcohol Use: Yes     Comment: 6-8oz /wk  . Drug Use: No  . Sexual Activity: Not on file   [name] Topics Concern  . Not on file   Social History Narrative        Medication List       This list is accurate as of: 10/15/14  6:06 PM.  Always use your most recent med list.               aspirin 81 MG EC tablet  Take 81 mg by mouth daily.     atorvastatin 20 MG tablet  Commonly known as:  LIPITOR  Take 1 tablet (20 mg total) by mouth daily.     COQ10 PO  Take by mouth.     gentamicin 0.3 % ophthalmic solution  Commonly known as:  GARAMYCIN  Place 2 drops into the left eye 3 (three) times daily.     glipiZIDE 10 MG tablet  Commonly known as:  GLUCOTROL   Take 1 tablet (10 mg total) by mouth 2 (two) times daily before a meal.     glucose blood test strip  1 each by [name] route daily. Use daily for glucose control - Dx 250.00     hydrochlorothiazide 12.5 MG capsule  Commonly known as:  MICROZIDE  TAKE ONE CAPSULE EVERY DAY     insulin aspart protamine- aspart (70-30) 100 UNIT/ML injection  Commonly known as:  NOVOLOG MIX 70/30  Inject 0.1 mLs (10 Units total) into the skin daily.     lisinopril 40 MG tablet  Commonly known as:  PRINIVIL,ZESTRIL  TAKE 1 AND 1/2 TABLET BY MOUTH EVERY DAY     metFORMIN 1000 MG tablet  Commonly known as:  GLUCOPHAGE  TAKE 1 TABLET TWICE A DAY WITH FOOD     sildenafil 100 MG tablet  Commonly known as:  VIAGRA  Take 1 tablet (100 mg total) by mouth as needed  for erectile dysfunction.           Objective:   Physical Exam  HENT:  Head:     BP 138/86 mmHg  Pulse 73  Temp(Src) 97.6 F (36.4 C) (Oral)  Ht 6\' 1"  (1.854 m)  Wt 250 lb 6 oz (113.569 kg)  BMI 33.04 kg/m2  SpO2 97% General -- alert, well-developed, NAD.  HEENT-- Not pale.  R Ear-- normal L ear-- normal Throat symmetric, no redness or discharge. Face symmetric, sinuses not tender to palpation. Nose slt  Congested. Eyes: Conjunctival erythema, mild to moderate, more noticeable on the left. No discharge. Anterior chambers normal, EOMI, pupils equal and reactive  Extremities-- no pretibial edema bilaterally  Neurologic--  alert & oriented X3. Speech normal, gait appropriate for age, strength symmetric and appropriate for age.  Psych-- Cognition and judgment appear intact. Cooperative with normal attention span and concentration. No anxious or depressed appearing.     Assessment & Plan:   Conjunctivitis, Primary or secondary to URI. Plan: Gentamicin, cold compresses, artificial tears  Also has symptoms consistent with a URI, conservative treatment. See instructions

## 2014-10-15 NOTE — Patient Instructions (Signed)
Use the antibiotic eyedrops for 5 days Cold  compress For comfort  use only artificial tears OTC as needed  Rest, fluids , tylenol If  cough, take Mucinex DM twice a day as needed  If nasal  congestion use OTC Nasocort or Flonase : 2 nasal sprays on each side of the nose daily until you feel better   Call if not gradually better over the next  10 days Call anytime if the symptoms are severe

## 2014-12-18 ENCOUNTER — Telehealth: Payer: Self-pay | Admitting: *Deleted

## 2014-12-18 DIAGNOSIS — E119 Type 2 diabetes mellitus without complications: Secondary | ICD-10-CM

## 2014-12-18 DIAGNOSIS — F528 Other sexual dysfunction not due to a substance or known physiological condition: Secondary | ICD-10-CM

## 2014-12-18 MED ORDER — GLIPIZIDE 10 MG PO TABS: 10.0000 mg | ORAL_TABLET | Freq: Two times a day (BID) | ORAL | Status: DC

## 2014-12-18 NOTE — Telephone Encounter (Signed)
glipiZIDE (GLUCOTROL) 10 MG tablet  Medication   Date: 02/17/2014  Department: [facility]  Ordering/Authorizing: Dorena Cookey, MD      Order Providers    Prescribing Provider Encounter Provider   Dorena Cookey, MD Dorena Cookey, MD    Medication Detail      Disp Refills Start End     glipiZIDE (GLUCOTROL) 10 MG tablet 200 tablet 3 02/17/2014     Sig - Route: Take 1 tablet (10 mg total) by mouth 2 (two) times daily before a meal. - Oral    E-Prescribing Status: Receipt confirmed by pharmacy (02/17/2014 10:12 AM EDT)     Associated Diagnoses    ERECTILE DYSFUNCTION      Diabetes mellitus, type 2       Pharmacy    [id number] - [place], [place] - [address]        Additional Information    Associated Reports   View Encounter   Priority and Order Details

## 2015-02-06 ENCOUNTER — Telehealth: Payer: Self-pay | Admitting: Family Medicine

## 2015-02-06 DIAGNOSIS — E119 Type 2 diabetes mellitus without complications: Secondary | ICD-10-CM

## 2015-02-06 DIAGNOSIS — F528 Other sexual dysfunction not due to a substance or known physiological condition: Secondary | ICD-10-CM

## 2015-02-06 MED ORDER — GLIPIZIDE 10 MG PO TABS
ORAL_TABLET | ORAL | Status: DC
Start: 2015-02-06 — End: 2015-10-06

## 2015-02-06 NOTE — Telephone Encounter (Signed)
Spoke with patient and new rx sent.

## 2015-02-06 NOTE — Telephone Encounter (Signed)
Patient would like to talk to you about his medication.  He states glipiZIDE (GLUCOTROL) 10 MG tablet directions are incorrect.  He said he takes "2 tablets twice per day".   [id number] - [place], [place]

## 2015-02-19 ENCOUNTER — Other Ambulatory Visit: Payer: Self-pay | Admitting: Family Medicine

## 2015-02-20 ENCOUNTER — Other Ambulatory Visit: Payer: Self-pay | Admitting: Family Medicine

## 2015-02-25 ENCOUNTER — Telehealth: Payer: Self-pay | Admitting: Family Medicine

## 2015-02-25 ENCOUNTER — Encounter: Payer: Self-pay | Admitting: Internal Medicine

## 2015-02-25 NOTE — Telephone Encounter (Signed)
Okay with Dr Todd 

## 2015-02-25 NOTE — Telephone Encounter (Signed)
Pt forgot to make his cpx due in April.  Is it ok w/ dr todd if pt gets his cpx w/ cory in May?

## 2015-02-26 ENCOUNTER — Other Ambulatory Visit: Payer: Self-pay | Admitting: Family Medicine

## 2015-03-06 NOTE — Telephone Encounter (Signed)
Patient calling back to schedule an appointment.

## 2015-03-20 ENCOUNTER — Other Ambulatory Visit (INDEPENDENT_AMBULATORY_CARE_PROVIDER_SITE_OTHER): Payer: No Typology Code available for payment source

## 2015-03-20 DIAGNOSIS — Z0000 Encounter for general adult medical examination without abnormal findings: Secondary | ICD-10-CM | POA: Diagnosis not present

## 2015-03-20 DIAGNOSIS — R7989 Other specified abnormal findings of blood chemistry: Secondary | ICD-10-CM

## 2015-03-20 LAB — HEPATIC FUNCTION PANEL
ALT: 34 U/L (ref 0–53)
AST: 23 U/L (ref 0–37)
Albumin: 4.3 g/dL (ref 3.5–5.2)
Alkaline Phosphatase: 55 U/L (ref 39–117)
BILIRUBIN DIRECT: 0.2 mg/dL (ref 0.0–0.3)
Total Bilirubin: 0.6 mg/dL (ref 0.2–1.2)
Total Protein: 6.6 g/dL (ref 6.0–8.3)

## 2015-03-20 LAB — CBC WITH DIFFERENTIAL/PLATELET
BASOS ABS: 0 10*3/uL (ref 0.0–0.1)
BASOS PCT: 0.5 % (ref 0.0–3.0)
Eosinophils Absolute: 0.4 10*3/uL (ref 0.0–0.7)
Eosinophils Relative: 3.5 % (ref 0.0–5.0)
HCT: 46.7 % (ref 39.0–52.0)
HEMOGLOBIN: 15.9 g/dL (ref 13.0–17.0)
Lymphocytes Relative: 36.5 % (ref 12.0–46.0)
Lymphs Abs: 3.6 10*3/uL (ref 0.7–4.0)
MCHC: 34.1 g/dL (ref 30.0–36.0)
MCV: 92.2 fl (ref 78.0–100.0)
MONOS PCT: 8.7 % (ref 3.0–12.0)
Monocytes Absolute: 0.9 10*3/uL (ref 0.1–1.0)
Neutro Abs: 5 10*3/uL (ref 1.4–7.7)
Neutrophils Relative %: 50.8 % (ref 43.0–77.0)
Platelets: 181 10*3/uL (ref 150.0–400.0)
RBC: 5.06 Mil/uL (ref 4.22–5.81)
RDW: 13.5 % (ref 11.5–15.5)
WBC: 9.9 10*3/uL (ref 4.0–10.5)

## 2015-03-20 LAB — POCT URINALYSIS DIPSTICK
BILIRUBIN UA: NEGATIVE
Glucose, UA: NEGATIVE
LEUKOCYTES UA: NEGATIVE
Nitrite, UA: NEGATIVE
RBC UA: NEGATIVE
Spec Grav, UA: >=1.03
Urobilinogen, UA: 0.2
pH, UA: 5

## 2015-03-20 LAB — BASIC METABOLIC PANEL
BUN: 18 mg/dL (ref 6–23)
CALCIUM: 9.3 mg/dL (ref 8.4–10.5)
CHLORIDE: 102 meq/L (ref 96–112)
CO2: 29 mEq/L (ref 19–32)
CREATININE: 1.03 mg/dL (ref 0.40–1.50)
GFR: 77.14 mL/min (ref >60.00)
Glucose, Bld: 143 mg/dL — ABNORMAL HIGH (ref 70–99)
Potassium: 4.9 mEq/L (ref 3.5–5.1)
SODIUM: 138 meq/L (ref 135–145)

## 2015-03-20 LAB — TSH: TSH: 1.11 u[IU]/mL (ref 0.35–4.50)

## 2015-03-20 LAB — LIPID PANEL
CHOL/HDL RATIO: 4
Cholesterol: 144 mg/dL (ref 0–200)
HDL: 34.4 mg/dL — ABNORMAL LOW (ref >39.00)
NonHDL: 109.6
TRIGLYCERIDES: 226 mg/dL — AB (ref 0.0–149.0)
VLDL: 45.2 mg/dL — AB (ref 0.0–40.0)

## 2015-03-20 LAB — MICROALBUMIN / CREATININE URINE RATIO
CREATININE, U: 391.6 mg/dL
MICROALB UR: 3.7 mg/dL — AB (ref 0.0–1.9)
Microalb Creat Ratio: 0.9 mg/g (ref 0.0–30.0)

## 2015-03-20 LAB — LDL CHOLESTEROL, DIRECT: Direct LDL: 74 mg/dL

## 2015-03-20 LAB — PSA: PSA: 3.42 ng/mL (ref 0.10–4.00)

## 2015-03-20 LAB — HEMOGLOBIN A1C: HEMOGLOBIN A1C: 6.9 % — AB (ref 4.6–6.5)

## 2015-03-26 ENCOUNTER — Encounter: Payer: Self-pay | Admitting: Adult Health

## 2015-03-26 ENCOUNTER — Ambulatory Visit (INDEPENDENT_AMBULATORY_CARE_PROVIDER_SITE_OTHER): Payer: No Typology Code available for payment source | Admitting: Adult Health

## 2015-03-26 VITALS — BP 120/80 | Temp 99.0°F | Ht 73.5 in | Wt 240.4 lb

## 2015-03-26 DIAGNOSIS — E785 Hyperlipidemia, unspecified: Secondary | ICD-10-CM | POA: Diagnosis not present

## 2015-03-26 DIAGNOSIS — Z0000 Encounter for general adult medical examination without abnormal findings: Secondary | ICD-10-CM

## 2015-03-26 DIAGNOSIS — N400 Enlarged prostate without lower urinary tract symptoms: Secondary | ICD-10-CM

## 2015-03-26 DIAGNOSIS — J069 Acute upper respiratory infection, unspecified: Secondary | ICD-10-CM

## 2015-03-26 DIAGNOSIS — I10 Essential (primary) hypertension: Secondary | ICD-10-CM | POA: Diagnosis not present

## 2015-03-26 DIAGNOSIS — IMO0002 Reserved for concepts with insufficient information to code with codable children: Secondary | ICD-10-CM

## 2015-03-26 DIAGNOSIS — E1165 Type 2 diabetes mellitus with hyperglycemia: Secondary | ICD-10-CM

## 2015-03-26 MED ORDER — AZITHROMYCIN 250 MG PO TABS: ORAL_TABLET | ORAL | Status: DC

## 2015-03-26 MED ORDER — ATORVASTATIN CALCIUM 40 MG PO TABS: 40.0000 mg | ORAL_TABLET | Freq: Every day | ORAL | Status: DC

## 2015-03-26 NOTE — Patient Instructions (Addendum)
It was great meeting you today. Have a great time sailing!  I have sent a prescription to the pharmacy for a z-pack and an increase in your Lipitor. If you find you are having muscle pain, please stop the 40 mg Lipitor and resume the 20 mg. Follow up in 3 months for repeat lab work and then 6 months for repeat PSA  Health Maintenance A healthy lifestyle and preventative care can promote health and wellness.  Maintain regular health, dental, and eye exams.  Eat a healthy diet. Foods like vegetables, fruits, whole grains, low-fat dairy products, and lean protein foods contain the nutrients you need and are low in calories. Decrease your intake of foods high in solid fats, added sugars, and salt. Get information about a proper diet from your health care provider, if necessary.  Regular physical exercise is one of the most important things you can do for your health. Most adults should get at least 150 minutes of moderate-intensity exercise (any activity that increases your heart rate and causes you to sweat) each week. In addition, most adults need muscle-strengthening exercises on 2 or more days a week.   Maintain a healthy weight. The body mass index (BMI) is a screening tool to identify possible weight problems. It provides an estimate of body fat based on height and weight. Your health care provider can find your BMI and can help you achieve or maintain a healthy weight. For males 20 years and older:  A BMI below 18.5 is considered underweight.  A BMI of 18.5 to 24.9 is normal.  A BMI of 25 to 29.9 is considered overweight.  A BMI of 30 and above is considered obese.  Maintain normal blood lipids and cholesterol by exercising and minimizing your intake of saturated fat. Eat a balanced diet with plenty of fruits and vegetables. Blood tests for lipids and cholesterol should begin at age 91 and be repeated every 5 years. If your lipid or cholesterol levels are high, you are over age 35, or you  are at high risk for heart disease, you may need your cholesterol levels checked more frequently.Ongoing high lipid and cholesterol levels should be treated with medicines if diet and exercise are not working.  If you smoke, find out from your health care provider how to quit. If you do not use tobacco, do not start.  Lung cancer screening is recommended for adults aged 85-80 years who are at high risk for developing lung cancer because of a history of smoking. A yearly low-dose CT scan of the lungs is recommended for people who have at least a 30-pack-year history of smoking and are current smokers or have quit within the past 15 years. A pack year of smoking is smoking an average of 1 pack of cigarettes a day for 1 year (for example, a 30-pack-year history of smoking could mean smoking 1 pack a day for 30 years or 2 packs a day for 15 years). Yearly screening should continue until the smoker has stopped smoking for at least 15 years. Yearly screening should be stopped for people who develop a health problem that would prevent them from having lung cancer treatment.  If you choose to drink alcohol, do not have more than 2 drinks per day. One drink is considered to be 12 oz (360 mL) of beer, 5 oz (150 mL) of wine, or 1.5 oz (45 mL) of liquor.  Avoid the use of street drugs. Do not share needles with anyone. Ask for help  if you need support or instructions about stopping the use of drugs.  High blood pressure causes heart disease and increases the risk of stroke. Blood pressure should be checked at least every 1-2 years. Ongoing high blood pressure should be treated with medicines if weight loss and exercise are not effective.  If you are 42-73 years old, ask your health care provider if you should take aspirin to prevent heart disease.  Diabetes screening involves taking a blood sample to check your fasting blood sugar level. This should be done once every 3 years after age 61 if you are at a normal  weight and without risk factors for diabetes. Testing should be considered at a younger age or be carried out more frequently if you are overweight and have at least 1 risk factor for diabetes.  Colorectal cancer can be detected and often prevented. Most routine colorectal cancer screening begins at the age of 16 and continues through age 29. However, your health care provider may recommend screening at an earlier age if you have risk factors for colon cancer. On a yearly basis, your health care provider may provide home test kits to check for hidden blood in the stool. A small camera at the end of a tube may be used to directly examine the colon (sigmoidoscopy or colonoscopy) to detect the earliest forms of colorectal cancer. Talk to your health care provider about this at age 22 when routine screening begins. A direct exam of the colon should be repeated every 5-10 years through age 69, unless early forms of precancerous polyps or small growths are found.  People who are at an increased risk for hepatitis B should be screened for this virus. You are considered at high risk for hepatitis B if:  You were born in a country where hepatitis B occurs often. Talk with your health care provider about which countries are considered high risk.  Your parents were born in a high-risk country and you have not received a shot to protect against hepatitis B (hepatitis B vaccine).  You have HIV or AIDS.  You use needles to inject street drugs.  You live with, or have sex with, someone who has hepatitis B.  You are a man who has sex with other men (MSM).  You get hemodialysis treatment.  You take certain medicines for conditions like cancer, organ transplantation, and autoimmune conditions.  Hepatitis C blood testing is recommended for all people born from [birth year] through 1966 and any individual with known risk factors for hepatitis C.  Healthy men should no longer receive prostate-specific antigen (PSA) blood  tests as part of routine cancer screening. Talk to your health care provider about prostate cancer screening.  Testicular cancer screening is not recommended for adolescents or adult males who have no symptoms. Screening includes self-exam, a health care provider exam, and other screening tests. Consult with your health care provider about any symptoms you have or any concerns you have about testicular cancer.  Practice safe sex. Use condoms and avoid high-risk sexual practices to reduce the spread of sexually transmitted infections (STIs).  You should be screened for STIs, including gonorrhea and chlamydia if:  You are sexually active and are younger than 24 years.  You are older than 24 years, and your health care provider tells you that you are at risk for this type of infection.  Your sexual activity has changed since you were last screened, and you are at an increased risk for chlamydia or gonorrhea. Ask  your health care provider if you are at risk.  If you are at risk of being infected with HIV, it is recommended that you take a prescription medicine daily to prevent HIV infection. This is called pre-exposure prophylaxis (PrEP). You are considered at risk if:  You are a man who has sex with other men (MSM).  You are a heterosexual man who is sexually active with multiple partners.  You take drugs by injection.  You are sexually active with a partner who has HIV.  Talk with your health care provider about whether you are at high risk of being infected with HIV. If you choose to begin PrEP, you should first be tested for HIV. You should then be tested every 3 months for as long as you are taking PrEP.  Use sunscreen. Apply sunscreen liberally and repeatedly throughout the day. You should seek shade when your shadow is shorter than you. Protect yourself by wearing long sleeves, pants, a wide-brimmed hat, and sunglasses year round whenever you are outdoors.  Tell your health care  provider of new moles or changes in moles, especially if there is a change in shape or color. Also, tell your health care provider if a mole is larger than the size of a pencil eraser.  A one-time screening for abdominal aortic aneurysm (AAA) and surgical repair of large AAAs by ultrasound is recommended for men aged 26-75 years who are current or former smokers.  Stay current with your vaccines (immunizations). Document Released: 04/07/2008 Document Revised: 10/15/2013 Document Reviewed: 03/07/2011 [facility] Patient Information 2015 [place], [place]. This information is not intended to replace advice given to you by your health care provider. Make sure you discuss any questions you have with your health care provider.

## 2015-03-26 NOTE — Progress Notes (Signed)
Pre visit review using our clinic review tool, if applicable. No additional management support is needed unless otherwise documented below in the visit note. 

## 2015-03-26 NOTE — Progress Notes (Signed)
Subjective:    Patient ID: Robert [name], male    DOB: 13-Jun-1951, 64 y.o.   MRN: [id number]  HPI  Robert [name] is a 64 year old married male nonsmoker who comes in today for annual physical examination because of a history of hyperlipidemia, diabetes type 2, hypertension, erectile dysfunction. He also has a sinus infection for the last three days. His wife was sick with the same issue and was given a z-pack. They are going to be sailing in the [place] for the next two weeks.   His labs and meds were reviewed. I increased his Lipitor to 40 mg. He's maxed out his oral medications for diabetes. His A1c despite his best efforts at diet and exercise is 6.9%. Fasting blood sugar was 148. His blood pressure is well controlled.  His PSA is elevated from 1.91 to 3.42.   They started insulin last visit, he has been playing around with it at night.   Is exercising 3-5 times a week   He endorses a healthier diet.   He gets routine eye care, dental care, and has a colonoscopy scheduled for July.  Review of Systems  Constitutional: Negative for fever, chills, diaphoresis, activity change, appetite change and fatigue.  HENT: Positive for congestion, facial swelling and sinus pressure. Negative for postnasal drip, rhinorrhea, sore throat and trouble swallowing.   Respiratory: Negative for cough, chest tightness, shortness of breath and wheezing.   Cardiovascular: Positive for leg swelling (when traveling). Negative for chest pain and palpitations.  Gastrointestinal: Negative for nausea, abdominal pain, diarrhea and constipation.  All [name] systems reviewed and are negative.  Past Medical History  Diagnosis Date  . HLD (hyperlipidemia)   . HTN (hypertension)   . Diabetes mellitus type II   . Skin cancer     history of  . Hx of colonic polyps     History   Social History  . Marital Status: Married    Spouse Name: N/A  . Number of Children: N/A  . Years of Education: N/A   Occupational  History  . DVD's   . Sales Marketing     Social History Main Topics  . Smoking status: Former Smoker    Types: Cigars    Quit date: 02/22/2011  . Smokeless tobacco: Not on file     Comment: 2 cigars monthly or less  . Alcohol Use: Yes     Comment: 6-8oz /wk  . Drug Use: No  . Sexual Activity: Not on file   [name] Topics Concern  . Not on file   Social History Narrative    Past Surgical History  Procedure Laterality Date  . Patellar tendon repair      Left  . Removal of excess tissue      Right and Left upper eyelids    Family History  Problem Relation Age of Onset  . Aortic aneurysm      family history  . Cancer      Breast (1st degree relative)    No Known Allergies  Current Outpatient Prescriptions on File Prior to Visit  Medication Sig Dispense Refill  . aspirin 81 MG EC tablet Take 81 mg by mouth daily.      . Coenzyme Q10 (COQ10 PO) Take by mouth.    [name] Kitchen glipiZIDE (GLUCOTROL) 10 MG tablet Take 2 tabs twice daily 120 tablet 6  . glucose blood test strip 1 each by [name] route daily. Use daily for glucose control - Dx 250.00 100 each 0  .  hydrochlorothiazide (MICROZIDE) 12.5 MG capsule TAKE ONE CAPSULE EVERY DAY 100 capsule 2  . insulin aspart protamine- aspart (NOVOLOG MIX 70/30) (70-30) 100 UNIT/ML injection Inject 0.1 mLs (10 Units total) into the skin daily. 10 mL 11  . lisinopril (PRINIVIL,ZESTRIL) 40 MG tablet TAKE 1 AND 1/2 TABLET BY MOUTH EVERY DAY 150 tablet 1  . metFORMIN (GLUCOPHAGE) 1000 MG tablet TAKE 1 TABLET TWICE A DAY WITH FOOD 200 tablet 3  . VIAGRA 100 MG tablet TAKE 1 TABLET BY MOUTH AS NEEDED FOR ERECTILE DYSFUNTION 10 tablet 3   No current facility-administered medications on file prior to visit.    BP 120/80 mmHg  Temp(Src) 99 F (37.2 C) (Oral)  Ht 6' 1.5" (1.867 m)  Wt 240 lb 6.4 oz (109.045 kg)  BMI 31.28 kg/m2       Objective:   Physical Exam  Constitutional: He is oriented to person, place, and time. He appears  well-developed and well-nourished. No distress.  Cardiovascular: Normal rate, regular rhythm, normal heart sounds and intact distal pulses.  Exam reveals no gallop and no friction rub.   No murmur heard. No carotid bruit  Pulmonary/Chest: Effort normal and breath sounds normal. No respiratory distress. He has no wheezes. He has no rales. He exhibits no tenderness.  Abdominal: Soft. Bowel sounds are normal. He exhibits no distension and no mass. There is no tenderness. There is no rebound and no guarding.  Genitourinary:  Deferred rectal/prostate exam  Musculoskeletal: Normal range of motion. He exhibits no edema or tenderness.  Neurological: He is alert and oriented to person, place, and time.  Skin: Skin is warm and dry. No rash noted. He is not diaphoretic. No erythema. No pallor.  Has wart on left shoulder. He does not want it removed at this time.   Psychiatric: He has a normal mood and affect. His behavior is normal. Judgment and thought content normal.  Nursing note and vitals reviewed.      Assessment & Plan:  1. Essential hypertension - Well controlled on current regimen  2. Hyperlipidemia - atorvastatin (LIPITOR) 40 MG tablet; Take 1 tablet (40 mg total) by mouth daily.  Dispense: 90 tablet; Refill: 3 - Continue to monitor - D/C if any muscle aches and restart 20 mg Lipitor.   3. Viral URI - Likely viral.  - azithromycin (ZITHROMAX Z-PAK) 250 MG tablet; Take 2 tablets on Day 1.  Then take 1 tablet daily.  Dispense: 6 tablet; Refill: 0 given if symptoms continue while on sailing trip.   4. BPH (benign prostatic hyperplasia) - Will recheck in 6 months.  - PSA  5. Diabetes type 2, uncontrolled - Not well controlled but A1c has dropped to 6.9.  - Hemoglobin A1c; Future - Basic metabolic panel; Future - Continue to monitor at home - Call with any abnormal blood sugars  6. Routine general medical examination at a health care facility - Follow up in 1 year for CPE -  Follow up as needed.

## 2015-03-29 ENCOUNTER — Other Ambulatory Visit: Payer: Self-pay | Admitting: Family Medicine

## 2015-05-06 ENCOUNTER — Encounter: Payer: Self-pay | Admitting: Internal Medicine

## 2015-06-17 ENCOUNTER — Encounter: Payer: Self-pay | Admitting: Internal Medicine

## 2015-07-07 ENCOUNTER — Encounter: Payer: Self-pay | Admitting: Adult Health

## 2015-07-07 ENCOUNTER — Ambulatory Visit (INDEPENDENT_AMBULATORY_CARE_PROVIDER_SITE_OTHER): Payer: No Typology Code available for payment source | Admitting: Adult Health

## 2015-07-07 VITALS — BP 140/92 | HR 102 | Temp 98.6°F | Ht 73.5 in | Wt 240.1 lb

## 2015-07-07 DIAGNOSIS — E785 Hyperlipidemia, unspecified: Secondary | ICD-10-CM

## 2015-07-07 DIAGNOSIS — IMO0002 Reserved for concepts with insufficient information to code with codable children: Secondary | ICD-10-CM

## 2015-07-07 DIAGNOSIS — E1165 Type 2 diabetes mellitus with hyperglycemia: Secondary | ICD-10-CM

## 2015-07-07 DIAGNOSIS — E1142 Type 2 diabetes mellitus with diabetic polyneuropathy: Secondary | ICD-10-CM | POA: Insufficient documentation

## 2015-07-07 DIAGNOSIS — J069 Acute upper respiratory infection, unspecified: Secondary | ICD-10-CM

## 2015-07-07 DIAGNOSIS — N400 Enlarged prostate without lower urinary tract symptoms: Secondary | ICD-10-CM | POA: Diagnosis not present

## 2015-07-07 DIAGNOSIS — E119 Type 2 diabetes mellitus without complications: Secondary | ICD-10-CM

## 2015-07-07 LAB — BASIC METABOLIC PANEL
BUN: 25 mg/dL — AB (ref 6–23)
CO2: 27 mEq/L (ref 19–32)
Calcium: 10.2 mg/dL (ref 8.4–10.5)
Chloride: 99 mEq/L (ref 96–112)
Creatinine, Ser: 1.11 mg/dL (ref 0.40–1.50)
GFR: 70.69 mL/min (ref >60.00)
Glucose, Bld: 146 mg/dL — ABNORMAL HIGH (ref 70–99)
POTASSIUM: 5 meq/L (ref 3.5–5.1)
SODIUM: 137 meq/L (ref 135–145)

## 2015-07-07 LAB — PSA: PSA: 1.4 ng/mL (ref 0.10–4.00)

## 2015-07-07 LAB — LDL CHOLESTEROL, DIRECT: LDL DIRECT: 77 mg/dL

## 2015-07-07 LAB — LIPID PANEL
Cholesterol: 138 mg/dL (ref 0–200)
HDL: 34.6 mg/dL — AB (ref >39.00)
NonHDL: 103.31
Total CHOL/HDL Ratio: 4
Triglycerides: 208 mg/dL — ABNORMAL HIGH (ref 0.0–149.0)
VLDL: 41.6 mg/dL — AB (ref 0.0–40.0)

## 2015-07-07 LAB — HEMOGLOBIN A1C: Hgb A1c MFr Bld: 7.1 % — ABNORMAL HIGH (ref 4.6–6.5)

## 2015-07-07 NOTE — Addendum Note (Signed)
Addended by: Colleen Can on: 07/07/2015 09:34 AM   Modules accepted: Orders

## 2015-07-07 NOTE — Progress Notes (Signed)
Subjective:    Patient ID: Robert [name], male    DOB: Mar 03, 1951, 64 y.o.   MRN: [id number]  HPI 64 year old male who presents to the office today for three month follow up regarding his PSA, DM 2 and Hyperlipidemia.   We tried 40 mg Lipitor at his last visit, which made him have cramps so he resumed 20 mg.   He endorses that his BS have been controlled at home. His last A1c was 6.9. BS have been running between 70-130. Has not been watching diet carefully over the summer.   His PSA had elevated during the last visit from 1.91 to 3.42. Will recheck during this visit.   His acute complaint today is that of a "chest cold" that started yesterday. He denies fever at home. Has mild cough with no production.      Review of Systems  Constitutional: Negative.   Respiratory: Positive for cough. Negative for shortness of breath.   Cardiovascular: Negative.   Neurological: Negative.   Hematological: Negative for adenopathy.  All [name] systems reviewed and are negative.  Past Medical History  Diagnosis Date  . HLD (hyperlipidemia)   . HTN (hypertension)   . Diabetes mellitus type II   . Skin cancer     history of  . Hx of colonic polyps     Social History   Social History  . Marital Status: Married    Spouse Name: N/A  . Number of Children: N/A  . 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 Smoking status: Former Smoker    Types: Cigars    Quit date: 02/22/2011  . Smokeless tobacco: Not on file     Comment: 2 cigars monthly or less  . Alcohol Use: Yes     Comment: 6-8oz /wk  . Drug Use: No  . Sexual Activity: Not on file   [name] Topics Concern  . Not on file   Social History Narrative    Past Surgical History  Procedure Laterality Date  . Patellar tendon repair      Left  . Removal of excess tissue      Right and Left upper eyelids    Family History  Problem Relation Age of Onset   . Aortic aneurysm      family history  . Cancer      Breast (1st degree relative)    No Known Allergies  Current Outpatient Prescriptions on File Prior to Visit  Medication Sig Dispense Refill  . aspirin 81 MG EC tablet Take 81 mg by mouth daily.      [name] Kitchen atorvastatin (LIPITOR) 40 MG tablet Take 1 tablet (40 mg total) by mouth daily. (Patient taking differently: Take 20 mg by mouth daily. ) 90 tablet 3  . azithromycin (ZITHROMAX Z-PAK) 250 MG tablet Take 2 tablets on Day 1.  Then take 1 tablet daily. 6 tablet 0  . Coenzyme Q10 (COQ10 PO) Take by mouth.    [name] Kitchen glipiZIDE (GLUCOTROL) 10 MG tablet Take 2 tabs twice daily 120 tablet 6  . glucose blood test strip 1 each by [name] route daily. Use daily for glucose control - Dx 250.00 100 each 0  . hydrochlorothiazide (MICROZIDE) 12.5 MG capsule TAKE ONE CAPSULE EVERY DAY 100 capsule 2  . insulin aspart protamine- aspart (NOVOLOG MIX 70/30) (70-30) 100 UNIT/ML injection Inject 0.1 mLs (10 Units total) into  the skin daily. 10 mL 11  . lisinopril (PRINIVIL,ZESTRIL) 40 MG tablet TAKE 1 AND 1/2 TABLET BY MOUTH EVERY DAY 150 tablet 1  . metFORMIN (GLUCOPHAGE) 1000 MG tablet TAKE 1 TABLET BY MOUTH TWICE A DAY WITH FOOD 200 tablet 2  . VIAGRA 100 MG tablet TAKE 1 TABLET BY MOUTH AS NEEDED FOR ERECTILE DYSFUNTION 10 tablet 3   No current facility-administered medications on file prior to visit.    BP 140/92 mmHg  Pulse 102  Temp(Src) 98.6 F (37 C) (Oral)  Ht 6' 1.5" (1.867 m)  Wt 240 lb 1.6 oz (108.909 kg)  BMI 31.24 kg/m2  SpO2 98%       Objective:   Physical Exam  Constitutional: He is oriented to person, place, and time. He appears well-developed and well-nourished. No distress.  Eyes: Right eye exhibits no discharge. Left eye exhibits no discharge.  Cardiovascular: Normal rate, regular rhythm and intact distal pulses.  Exam reveals no gallop and no friction rub.   No murmur heard. Pulmonary/Chest: Effort normal and breath sounds normal.  No respiratory distress. He has no wheezes. He has no rales. He exhibits no tenderness.  Lymphadenopathy:    He has no cervical adenopathy.  Neurological: He is alert and oriented to person, place, and time.  Skin: Skin is warm and dry. No rash noted. No erythema. No pallor.  Psychiatric: He has a normal mood and affect. His behavior is normal. Judgment and thought content normal.  Nursing note and vitals reviewed.      Assessment & Plan:  1. Hyperlipidemia - Lipid Panel - Future - Follow up with PCP  2. Diabetes type 2, controlled A1c and BMP- Future  3. Viral URI - Likely viral, OTC cough cough medication - Consider Abx if lasts longer than 10 days.

## 2015-07-07 NOTE — Addendum Note (Signed)
Addended by: Colleen Can on: 07/07/2015 09:40 AM   Modules accepted: Orders

## 2015-07-07 NOTE — Patient Instructions (Signed)
It was great seeing you again!  I will follow up with you regarding your blood work.   Continue to diet and exercise.   Follow up with Dr. Sherren Mocha

## 2015-07-07 NOTE — Addendum Note (Signed)
Addended by: Elmer Picker on: 07/07/2015 09:30 AM   Modules accepted: Orders

## 2015-07-09 ENCOUNTER — Telehealth: Payer: Self-pay | Admitting: Adult Health

## 2015-07-09 NOTE — Telephone Encounter (Signed)
Left VM with lab results. Follow up with Dr. Sherren Mocha in three months

## 2015-07-25 ENCOUNTER — Other Ambulatory Visit: Payer: Self-pay | Admitting: Family Medicine

## 2015-07-28 ENCOUNTER — Encounter: Payer: Self-pay | Admitting: Family Medicine

## 2015-07-28 ENCOUNTER — Ambulatory Visit (INDEPENDENT_AMBULATORY_CARE_PROVIDER_SITE_OTHER): Payer: Medicare Other | Admitting: Family Medicine

## 2015-07-28 VITALS — BP 120/90 | Temp 98.6°F | Wt 242.0 lb

## 2015-07-28 DIAGNOSIS — Z23 Encounter for immunization: Secondary | ICD-10-CM

## 2015-07-28 DIAGNOSIS — E119 Type 2 diabetes mellitus without complications: Secondary | ICD-10-CM | POA: Diagnosis not present

## 2015-07-28 DIAGNOSIS — I10 Essential (primary) hypertension: Secondary | ICD-10-CM | POA: Diagnosis not present

## 2015-07-28 DIAGNOSIS — E785 Hyperlipidemia, unspecified: Secondary | ICD-10-CM

## 2015-07-28 DIAGNOSIS — Z794 Long term (current) use of insulin: Secondary | ICD-10-CM

## 2015-07-28 NOTE — Progress Notes (Signed)
   Subjective:    Patient ID: Robert [name] Other, male    DOB: [birth year], 64 y.o.   MRN: [id number]  HPI Been is a 64 year old married male nonsmoker who comes in today for follow-up of hypertension diabetes hyperlipidemia  He takes Lipitor 40 mg daily however he decrease the dose to 20 mg because of muscle aches. Lipids are still pretty good on the 20 mg dose I would probably leave that alone.  He had a follow-up PSA because it was up to 3.4. PSA recently and follow-up 1.4.  He monitors his blood pressure at home. He has an automatic devices for 64 years old. He's getting systolics in the 021-115 range here systolic here 5:20. Advised him again new blood pressure cuff monitors blood pressure for 6 weeks and return if indeed it's elevated at home  His blood sugar is in the 140 range A1c was 6.9 not 7.1.  He takes Glucotrol 10 mg twice daily and metformin 1000 mg twice a day insulin only if his blood sugar is elevated.  He exercises on a regular basis   Review of Systems Review of systems otherwise negative    Objective:   Physical Exam Well-developed well-nourished male no acute distress vital signs stable he is afebrile ENT exam because of a complaint of ringing in his hears was normal       Assessment & Plan:  Diabetes type 2............ almost at goal recommend he get his A1c below 7.0% by taken 5 units of insulin daily on a regular basis instead of intermittently  Hyperlipidemia............ continue Lipitor 20 mg daily  Tinnitus.......... observe  ...... normal PSA  Hypertension question at goal........... purchase a new blood pressure cuff monitor blood pressure for 6 weeks follow-up if not at goal

## 2015-07-28 NOTE — Progress Notes (Signed)
Pre visit review using our clinic review tool, if applicable. No additional management support is needed unless otherwise documented below in the visit note. [name] [email or web address]

## 2015-07-28 NOTE — Patient Instructions (Signed)
Purchase a Omron pump up digital blood pressure cuff,,,,,,,, [employer],,,,,,,, check your blood pressure daily in the morning for 6 weeks,,,,,,,,, blood pressure goal 135/85 or less,,,,,,,, if not at goal return in 6 weeks with the data and the device for follow-up,,,,,,,,,,,, if your blood pressure is at goal then just check your blood pressure once weekly going forward  Continue oral meds,,,,,,,,,, at 5 units of insulin daily  Follow-up on your blood sugar in 3 months........ nonfasting labs one week prior  You can take the Lipitor 40 mg Monday Wednesday Friday.......[name] Kitchen or 20 mg daily

## 2015-08-12 ENCOUNTER — Ambulatory Visit (AMBULATORY_SURGERY_CENTER): Payer: Self-pay

## 2015-08-12 VITALS — Ht 75.0 in | Wt 240.0 lb

## 2015-08-12 DIAGNOSIS — Z860100 Personal history of colon polyps, unspecified: Secondary | ICD-10-CM

## 2015-08-12 MED ORDER — SUPREP BOWEL PREP KIT 17.5-3.13-1.6 GM/177ML PO SOLN: 1.0000 | Freq: Once | ORAL | Status: DC

## 2015-08-12 NOTE — Progress Notes (Signed)
No allergies to eggs or soy No past problems with anesthesia No diet/weight loss meds No home oxygen  Refused emmi 

## 2015-08-26 ENCOUNTER — Ambulatory Visit (AMBULATORY_SURGERY_CENTER): Payer: Medicare Other | Admitting: Internal Medicine

## 2015-08-26 ENCOUNTER — Encounter: Payer: Self-pay | Admitting: Internal Medicine

## 2015-08-26 VITALS — BP 126/83 | HR 69 | Temp 96.8°F | Resp 28 | Ht 75.0 in | Wt 240.0 lb

## 2015-08-26 DIAGNOSIS — K635 Polyp of colon: Secondary | ICD-10-CM | POA: Diagnosis not present

## 2015-08-26 DIAGNOSIS — D124 Benign neoplasm of descending colon: Secondary | ICD-10-CM | POA: Diagnosis not present

## 2015-08-26 DIAGNOSIS — Z86010 Personal history of colonic polyps: Secondary | ICD-10-CM

## 2015-08-26 DIAGNOSIS — D123 Benign neoplasm of transverse colon: Secondary | ICD-10-CM

## 2015-08-26 DIAGNOSIS — D125 Benign neoplasm of sigmoid colon: Secondary | ICD-10-CM | POA: Diagnosis not present

## 2015-08-26 DIAGNOSIS — E119 Type 2 diabetes mellitus without complications: Secondary | ICD-10-CM | POA: Diagnosis not present

## 2015-08-26 DIAGNOSIS — I10 Essential (primary) hypertension: Secondary | ICD-10-CM | POA: Diagnosis not present

## 2015-08-26 MED ORDER — SODIUM CHLORIDE 0.9 % IV SOLN: 500.0000 mL | INTRAVENOUS | Status: DC

## 2015-08-26 NOTE — Progress Notes (Signed)
Called to room to assist during endoscopic procedure.  Patient ID and intended procedure confirmed with present staff. Received instructions for my participation in the procedure from the performing physician.  

## 2015-08-26 NOTE — Progress Notes (Signed)
Report to PACU, RN, vss, BBS= Clear.  

## 2015-08-26 NOTE — Progress Notes (Signed)
No problems noted in the recovery room. maw 

## 2015-08-26 NOTE — Op Note (Signed)
[place]  [address] [place], [id number]   COLONOSCOPY PROCEDURE REPORT  PATIENT: Robert [name], Robert [name]  MR#: [id number] BIRTHDATE: [birth year] , 69  yrs. old GENDER: male ENDOSCOPIST: Gatha Mayer, MD, [facility] PROCEDURE DATE:  08/26/2015 PROCEDURE:   Colonoscopy, surveillance and Colonoscopy with snare polypectomy First Screening Colonoscopy - Avg.  risk and is 50 yrs.  old or older - No.  Prior Negative Screening - Now for repeat screening. N/A  History of Adenoma - Now for follow-up colonoscopy & has been > or = to 3 yrs.  Yes hx of adenoma.  Has been 3 or more years since last colonoscopy.  Polyps removed today? Yes ASA CLASS:   Class III INDICATIONS:Surveillance due to prior colonic neoplasia and PH Colon Adenoma. MEDICATIONS: Propofol 450 mg IV and Monitored anesthesia care  DESCRIPTION OF PROCEDURE:   After the risks benefits and alternatives of the procedure were thoroughly explained, informed consent was obtained.  The digital rectal exam revealed no abnormalities of the rectum, revealed no prostatic nodules, and revealed the prostate was not enlarged.   The LB [id number] endoscope was introduced through the anus and advanced to the cecum, which was identified by both the appendix and ileocecal valve. No adverse events experienced.   The quality of the prep was excellent.  (Suprep was used)  The instrument was then slowly withdrawn as the colon was fully examined. Estimated blood loss is zero unless otherwise noted in this procedure report.   COLON FINDINGS: Four sessile polyps ranging from 5 to 37mm in size were found in the sigmoid colon, descending colon, and transverse colon.  Polypectomies were performed with a cold snare and using snare cautery.  The resection was complete, the polyp tissue was completely retrieved and sent to histology.   There was diverticulosis noted throughout the entire examined colon.   The examination was  otherwise normal.  Retroflexed views revealed no abnormalities. The time to cecum = 2.2 Withdrawal time = 19.0   The scope was withdrawn and the procedure completed. COMPLICATIONS: There were no immediate complications.  ENDOSCOPIC IMPRESSION: 1.   Four sessile polyps ranging from 5 to 76mm in size were found in the sigmoid colon, descending colon, and transverse colon; polypectomies were performed with a cold snare and using snare cautery 2.   Diverticulosis was noted throughout the entire examined colon 3.   The examination was otherwise normal - excellent prep  RECOMMENDATIONS: 1.  Hold Aspirin and all other NSAIDS for 2 weeks. 2.  Timing of repeat colonoscopy will be determined by pathology findings in pt w/ adenomas 2005 and 2010  eSigned:  Gatha Mayer, MD, [facility] 08/26/2015 9:25 AM  cc: The Patient

## 2015-08-26 NOTE — Patient Instructions (Addendum)
I found and removed 4 polyps - today and they all look benign. I will let you know pathology results and when to have another routine colonoscopy by mail.  I appreciate the opportunity to care for you. Gatha Mayer, MD, FACG       YOU HAD AN ENDOSCOPIC PROCEDURE TODAY AT [facility]:   Refer to the procedure report that was given to you for any specific questions about what was found during the examination.  If the procedure report does not answer your questions, please call your gastroenterologist to clarify.  If you requested that your care partner not be given the details of your procedure findings, then the procedure report has been included in a sealed envelope for you to review at your convenience later.  YOU SHOULD EXPECT: Some feelings of bloating in the abdomen. Passage of more gas than usual.  Walking can help get rid of the air that was put into your GI tract during the procedure and reduce the bloating. If you had a lower endoscopy (such as a colonoscopy or flexible sigmoidoscopy) you may notice spotting of blood in your stool or on the toilet paper. If you underwent a bowel prep for your procedure, you may not have a normal bowel movement for a few days.  Please Note:  You might notice some irritation and congestion in your nose or some drainage.  This is from the oxygen used during your procedure.  There is no need for concern and it should clear up in a day or so.  SYMPTOMS TO REPORT IMMEDIATELY:   Following lower endoscopy (colonoscopy or flexible sigmoidoscopy):  Excessive amounts of blood in the stool  Significant tenderness or worsening of abdominal pains  Swelling of the abdomen that is new, acute  Fever of 100F or higher   For urgent or emergent issues, a gastroenterologist can be reached at any hour by calling [phone].   DIET: Your first meal following the procedure should be a small meal and then it is ok to progress to your  normal diet. Heavy or fried foods are harder to digest and may make you feel nauseous or bloated.  Likewise, meals heavy in dairy and vegetables can increase bloating.  Drink plenty of fluids but you should avoid alcoholic beverages for 24 hours.  ACTIVITY:  You should plan to take it easy for the rest of today and you should NOT DRIVE or use heavy machinery until tomorrow (because of the sedation medicines used during the test).    FOLLOW UP: Our staff will call the number listed on your records the next business day following your procedure to check on you and address any questions or concerns that you may have regarding the information given to you following your procedure. If we do not reach you, we will leave a message.  However, if you are feeling well and you are not experiencing any problems, there is no need to return our call.  We will assume that you have returned to your regular daily activities without incident.  If any biopsies were taken you will be contacted by phone or by letter within the next 1-3 weeks.  Please call us at [phone] if you have not heard about the biopsies in 3 weeks.    SIGNATURES/CONFIDENTIALITY: You and/or your care partner have signed paperwork which will be entered into your electronic medical record.  These signatures attest to the fact that that the information above on your  After Visit Summary has been reviewed and is understood.  Full responsibility of the confidentiality of this discharge information lies with you and/or your care-partner.     Handouts were given to your care partner on polyps,\ and diverticulosis. Hold aspirin and NSAIDs for two weeks.  Hold aspirin 81 mg for two weeks. You may resume your other current medications today. Your blood sugar was 215 in the recovery room. Await biopsy results. Please call if any questions or concerns.

## 2015-08-27 ENCOUNTER — Telehealth: Payer: Self-pay | Admitting: *Deleted

## 2015-08-27 NOTE — Telephone Encounter (Signed)
  Follow up Call-  Call back number 08/26/2015  Post procedure Call Back phone  # [phone]  Permission to leave phone message Yes     Patient questions:  Do you have a fever, pain , or abdominal swelling? No. Pain Score  0 *  Have you tolerated food without any problems? Yes.    Have you been able to return to your normal activities? Yes.    Do you have any questions about your discharge instructions: Diet   No. Medications  No. Follow up visit  No.  Do you have questions or concerns about your Care? No.  Actions: * If pain score is 4 or above: No action needed, pain <4.

## 2015-09-02 ENCOUNTER — Encounter: Payer: Self-pay | Admitting: Internal Medicine

## 2015-09-02 DIAGNOSIS — Z86010 Personal history of colonic polyps: Secondary | ICD-10-CM

## 2015-09-02 NOTE — Progress Notes (Signed)
Quick Note:  1-2 adenomas max 10 mm ______

## 2015-09-02 NOTE — Progress Notes (Signed)
Quick Note:  Max size 15 mm ______

## 2015-09-10 ENCOUNTER — Other Ambulatory Visit: Payer: Self-pay | Admitting: Family Medicine

## 2015-09-14 ENCOUNTER — Other Ambulatory Visit: Payer: Self-pay | Admitting: *Deleted

## 2015-09-14 MED ORDER — LISINOPRIL 40 MG PO TABS: 60.0000 mg | ORAL_TABLET | Freq: Every day | ORAL | Status: DC

## 2015-09-15 ENCOUNTER — Other Ambulatory Visit: Payer: Self-pay | Admitting: Family Medicine

## 2015-09-15 MED ORDER — METFORMIN HCL 1000 MG PO TABS: 1000.0000 mg | ORAL_TABLET | Freq: Two times a day (BID) | ORAL | Status: DC

## 2015-10-06 ENCOUNTER — Other Ambulatory Visit: Payer: Self-pay | Admitting: Family Medicine

## 2015-10-23 DIAGNOSIS — L570 Actinic keratosis: Secondary | ICD-10-CM | POA: Diagnosis not present

## 2015-10-23 DIAGNOSIS — L821 Other seborrheic keratosis: Secondary | ICD-10-CM | POA: Diagnosis not present

## 2015-10-23 DIAGNOSIS — D485 Neoplasm of uncertain behavior of skin: Secondary | ICD-10-CM | POA: Diagnosis not present

## 2015-10-23 DIAGNOSIS — L820 Inflamed seborrheic keratosis: Secondary | ICD-10-CM | POA: Diagnosis not present

## 2015-10-29 ENCOUNTER — Other Ambulatory Visit (INDEPENDENT_AMBULATORY_CARE_PROVIDER_SITE_OTHER): Payer: Medicare Other

## 2015-10-29 DIAGNOSIS — E119 Type 2 diabetes mellitus without complications: Secondary | ICD-10-CM | POA: Diagnosis not present

## 2015-10-29 DIAGNOSIS — I10 Essential (primary) hypertension: Secondary | ICD-10-CM

## 2015-10-29 LAB — BASIC METABOLIC PANEL
BUN: 16 mg/dL (ref 6–23)
CHLORIDE: 103 meq/L (ref 96–112)
CO2: 27 mEq/L (ref 19–32)
CREATININE: 0.88 mg/dL (ref 0.40–1.50)
Calcium: 9.4 mg/dL (ref 8.4–10.5)
GFR: 92.32 mL/min (ref >60.00)
Glucose, Bld: 211 mg/dL — ABNORMAL HIGH (ref 70–99)
Potassium: 4.5 mEq/L (ref 3.5–5.1)
Sodium: 136 mEq/L (ref 135–145)

## 2015-10-29 LAB — HEMOGLOBIN A1C: Hgb A1c MFr Bld: 7.6 % — ABNORMAL HIGH (ref 4.6–6.5)

## 2015-11-02 ENCOUNTER — Ambulatory Visit: Payer: No Typology Code available for payment source | Admitting: Family Medicine

## 2015-11-05 ENCOUNTER — Ambulatory Visit (INDEPENDENT_AMBULATORY_CARE_PROVIDER_SITE_OTHER): Payer: Medicare Other | Admitting: Family Medicine

## 2015-11-05 ENCOUNTER — Encounter: Payer: Self-pay | Admitting: Family Medicine

## 2015-11-05 VITALS — BP 120/80 | Temp 98.7°F | Wt 248.0 lb

## 2015-11-05 DIAGNOSIS — E119 Type 2 diabetes mellitus without complications: Secondary | ICD-10-CM | POA: Diagnosis not present

## 2015-11-05 DIAGNOSIS — E785 Hyperlipidemia, unspecified: Secondary | ICD-10-CM | POA: Diagnosis not present

## 2015-11-05 DIAGNOSIS — Z794 Long term (current) use of insulin: Secondary | ICD-10-CM | POA: Diagnosis not present

## 2015-11-05 MED ORDER — GLIPIZIDE 10 MG PO TABS: 10.0000 mg | ORAL_TABLET | Freq: Two times a day (BID) | ORAL | Status: DC

## 2015-11-05 MED ORDER — METFORMIN HCL 1000 MG PO TABS: 1000.0000 mg | ORAL_TABLET | Freq: Two times a day (BID) | ORAL | Status: DC

## 2015-11-05 MED ORDER — LISINOPRIL 40 MG PO TABS: 60.0000 mg | ORAL_TABLET | Freq: Every day | ORAL | Status: DC

## 2015-11-05 MED ORDER — ATORVASTATIN CALCIUM 40 MG PO TABS: 40.0000 mg | ORAL_TABLET | Freq: Every day | ORAL | Status: DC

## 2015-11-05 MED ORDER — INSULIN ASPART PROT & ASPART (70-30 MIX) 100 UNIT/ML ~~LOC~~ SUSP: 10.0000 [IU] | Freq: Every day | SUBCUTANEOUS | Status: DC

## 2015-11-05 MED ORDER — HYDROCHLOROTHIAZIDE 12.5 MG PO CAPS: 12.5000 mg | ORAL_CAPSULE | Freq: Every day | ORAL | Status: DC

## 2015-11-05 NOTE — Progress Notes (Signed)
Pre visit review using our clinic review tool, if applicable. No additional management support is needed unless otherwise documented below in the visit note. 

## 2015-11-05 NOTE — Patient Instructions (Signed)
Metformin 1000 mg....... one tablet before breakfast and one tablet before your evening male  Glucotrol 10 mg........ one tablet before breakfast and one tablet before your evening male  Insulin 5 units daily at bedtime  Check a fasting blood sugar daily in the morning.........[name] Kitchen like to see this fasting blood sugars 80-120  If after 3 weeks your blood sugar is not within that range............ increase your insulin to 10 units......... in other words increase your insulin by 5 units every 3 weeks to get a fasting blood sugar between 80 and 120  Return in 3 months for follow-up  Nonfasting labs one week prior

## 2015-11-05 NOTE — Progress Notes (Signed)
   Subjective:    Patient ID: Robert [name], male    DOB: 1951/06/04, 65 y.o.   MRN: [id number]  HPI Been is a 65 -year-old married male nonsmoker who comes in today for follow-up of diabetes type 1  He's been taking Glucotrol 20 mg twice a day and metformin 1000 mg twice a day and his decrease his insulin from 10 units to 5 however his A1c is gone up to 7.6%.   Review of Systems    negative review of systems Objective:   Physical Exam Well-developed well-nourished male no acute distress vital signs stable he is afebrile BP goal 120/80       Assessment & Plan:  Diabetes type 1 not at goal............. decrease Glucotrol to 10 mg twice a day....... continue metformin 1000 mg twice a day....... insulin 5 units daily...[name] KitchenMarland KitchenMarland Kitchen follow-up A1c in 3 months.

## 2016-01-27 ENCOUNTER — Other Ambulatory Visit (INDEPENDENT_AMBULATORY_CARE_PROVIDER_SITE_OTHER): Payer: Medicare Other

## 2016-01-27 DIAGNOSIS — Z794 Long term (current) use of insulin: Secondary | ICD-10-CM

## 2016-01-27 DIAGNOSIS — E119 Type 2 diabetes mellitus without complications: Secondary | ICD-10-CM | POA: Diagnosis not present

## 2016-01-27 LAB — HEMOGLOBIN A1C: Hgb A1c MFr Bld: 7.5 % — ABNORMAL HIGH (ref 4.6–6.5)

## 2016-01-27 LAB — BASIC METABOLIC PANEL
BUN: 17 mg/dL (ref 6–23)
CHLORIDE: 104 meq/L (ref 96–112)
CO2: 30 meq/L (ref 19–32)
Calcium: 10.1 mg/dL (ref 8.4–10.5)
Creatinine, Ser: 0.88 mg/dL (ref 0.40–1.50)
GFR: 92.25 mL/min (ref >60.00)
GLUCOSE: 192 mg/dL — AB (ref 70–99)
POTASSIUM: 4.4 meq/L (ref 3.5–5.1)
SODIUM: 140 meq/L (ref 135–145)

## 2016-02-03 ENCOUNTER — Ambulatory Visit: Payer: Medicare Other | Admitting: Family Medicine

## 2016-02-08 ENCOUNTER — Ambulatory Visit (INDEPENDENT_AMBULATORY_CARE_PROVIDER_SITE_OTHER): Payer: Medicare Other | Admitting: Family Medicine

## 2016-02-08 ENCOUNTER — Encounter: Payer: Self-pay | Admitting: Family Medicine

## 2016-02-08 VITALS — BP 120/80 | Temp 99.0°F | Wt 248.0 lb

## 2016-02-08 DIAGNOSIS — Z794 Long term (current) use of insulin: Secondary | ICD-10-CM

## 2016-02-08 DIAGNOSIS — E119 Type 2 diabetes mellitus without complications: Secondary | ICD-10-CM

## 2016-02-08 NOTE — Patient Instructions (Signed)
Continue the glipizide 10 mg,,,,,,,,,,, one before breakfast one before your evening meal  Continue the metformin 1000 mg,,,,,, one before breakfast 14 evening meal  Insulin 10 units,,,,,,,, take it prior to your p.m. meal  Check a fasting blood sugar daily in the morning  Fax [place] the data,,,,,,, [phone],,,,,,,, in one week

## 2016-02-08 NOTE — Progress Notes (Signed)
   Subjective:    Patient ID: Robert [name], male    DOB: [birth year], 65 y.o.   MRN: [id number]  HPI Robert [name] is a 65 year old married male nonsmoker who comes in today for follow-up of hypertension diabetes and allergic rhinitis  We had difficulty controlling his blood sugar. Sometimes he forgets to take his medication. He typically follows a fairly good diet but not always. In January his A1c was 7.6% now it' 7.5 so essentially unchanged. He's been taking Glucotrol 10 mg twice a day metformin 1000 mg twice a day in 5-10 units of 7030 insulin at bedtime. He's afraid to take more because he's had episodes of hypoglycemia night.  He's also had a flare of his allergies. He went the beach came back and started sneezing head congestion etc. after mowing the grass   Review of Systems    review of systems otherwise negative Objective:   Physical Exam  Well-developed well-nourished male no acute distress vital signs stable he is afebrile      Assessment & Plan:  Diabetes type 1.........[name] Kitchen 10 units of insulin...........[name] Kitchen but take it with his evening meal  Fasting blood sugar daily in the morning........[name] Kitchen fax [place] the report in one week  Continue Glucotrol and metformin one of each before breakfast one of each for his evening meal

## 2016-02-08 NOTE — Progress Notes (Signed)
Pre visit review using our clinic review tool, if applicable. No additional management support is needed unless otherwise documented below in the visit note. 

## 2016-02-17 ENCOUNTER — Ambulatory Visit (INDEPENDENT_AMBULATORY_CARE_PROVIDER_SITE_OTHER): Payer: Medicare Other | Admitting: Medical

## 2016-02-17 ENCOUNTER — Encounter: Payer: Self-pay | Admitting: Medical

## 2016-02-17 VITALS — BP 118/78 | HR 77 | Temp 98.1°F | Wt 247.6 lb

## 2016-02-17 DIAGNOSIS — J0100 Acute maxillary sinusitis, unspecified: Secondary | ICD-10-CM | POA: Diagnosis not present

## 2016-02-17 DIAGNOSIS — R062 Wheezing: Secondary | ICD-10-CM | POA: Diagnosis not present

## 2016-02-17 DIAGNOSIS — J309 Allergic rhinitis, unspecified: Secondary | ICD-10-CM

## 2016-02-17 MED ORDER — HYDROCODONE-HOMATROPINE 5-1.5 MG/5ML PO SYRP: 5.0000 mL | ORAL_SOLUTION | Freq: Three times a day (TID) | ORAL | Status: DC | PRN

## 2016-02-17 MED ORDER — ALBUTEROL SULFATE HFA 108 (90 BASE) MCG/ACT IN AERS: 2.0000 | INHALATION_SPRAY | Freq: Four times a day (QID) | RESPIRATORY_TRACT | Status: DC | PRN

## 2016-02-17 MED ORDER — AZITHROMYCIN 250 MG PO TABS: ORAL_TABLET | ORAL | Status: DC

## 2016-02-17 MED ORDER — LEVOCETIRIZINE DIHYDROCHLORIDE 5 MG PO TABS: 5.0000 mg | ORAL_TABLET | Freq: Every evening | ORAL | Status: DC

## 2016-02-17 MED ORDER — FLUTICASONE PROPIONATE 50 MCG/ACT NA SUSP: 2.0000 | Freq: Every day | NASAL | Status: DC

## 2016-02-17 NOTE — Patient Instructions (Addendum)
You appear to have started with allergies and will rx flonase ans xyzal.  For cough will rx hycodan.  For early sinus infection and possible bronchitis rx azithromycin.  For wheezing will rx ventolin.  Follow up in 7 days or as needed

## 2016-02-17 NOTE — Progress Notes (Addendum)
Subjective:    Patient ID: Robert [name], male    DOB: [birth year], 65 y.o.   MRN: [id number]  HPI  Pt in with recent cough. Pt states seemed to occur after mowing yard. Some recent faint sinus pressure. Some productive cough. Coughing some at night.  This started on about 10 days ago cough started. But symptoms have worsened since past Friday. No fever, no chills and no sweats. But wife keeps heat on at night.  Pt tried some claritin initially but non now.  Pt has used some left over hycodan and some albuterol. Cough is worse at night.    Review of Systems  Constitutional: Negative for fever, chills and fatigue.  HENT: Positive for congestion, postnasal drip and sinus pressure. Negative for sneezing.   Respiratory: Positive for cough and wheezing. Negative for shortness of breath.        Faint wheeze  Cardiovascular: Negative for chest pain and palpitations.  Gastrointestinal: Negative for anal bleeding.  Musculoskeletal: Negative for myalgias and back pain.  Hematological: Negative for adenopathy. Does not bruise/bleed easily.  Psychiatric/Behavioral: Negative for confusion and agitation.   Past Medical History  Diagnosis Date  . HLD (hyperlipidemia)   . HTN (hypertension)   . Diabetes mellitus type II   . Skin cancer     history of  . Hx of colonic polyps      Social History   Social History  . Marital Status: Married    Spouse Name: N/A  . Number of Children: N/A  . 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 Smoking status: Former Smoker    Types: Cigars    Quit date: 02/22/2011  . Smokeless tobacco: Never Used     Comment: 2 cigars monthly or less  . Alcohol Use: 0.0 oz/week    0 Standard drinks or equivalent per week     Comment: 6-8oz /wk  . Drug Use: No  . Sexual Activity: Not on file   [name] Topics Concern  . Not on file   Social History Narrative    Past Surgical  History  Procedure Laterality Date  . Patellar tendon repair      Left  . Removal of excess tissue      Right and Left upper eyelids  . Colonoscopy      Family History  Problem Relation Age of Onset  . Aortic aneurysm      family history  . Cancer      Breast (1st degree relative)  . Colon cancer Neg Hx     No Known Allergies  Current Outpatient Prescriptions on File Prior to Visit  Medication Sig Dispense Refill  . aspirin 81 MG EC tablet Take 81 mg by mouth daily.      [name] Kitchen atorvastatin (LIPITOR) 40 MG tablet Take 1 tablet (40 mg total) by mouth daily. 90 tablet 3  . Coenzyme Q10 (COQ10 PO) Take by mouth.    [name] Kitchen glipiZIDE (GLUCOTROL) 10 MG tablet Take 1 tablet (10 mg total) by mouth 2 (two) times daily before a meal. 200 tablet 3  . glucose blood test strip 1 each by [name] route daily. Use daily for glucose control - Dx 250.00 100 each 0  . hydrochlorothiazide (MICROZIDE) 12.5 MG capsule Take 1 capsule (12.5 mg total) by mouth daily. 100 capsule 3  . insulin aspart protamine- aspart (NOVOLOG MIX 70/30) (70-30)  100 UNIT/ML injection Inject 0.1 mLs (10 Units total) into the skin daily. 10 mL 11  . lisinopril (PRINIVIL,ZESTRIL) 40 MG tablet Take 1.5 tablets (60 mg total) by mouth daily. 150 tablet 1  . metFORMIN (GLUCOPHAGE) 1000 MG tablet Take 1 tablet (1,000 mg total) by mouth 2 (two) times daily with a meal. 180 tablet 3  . VIAGRA 100 MG tablet TAKE 1 TABLET BY MOUTH AS NEEDED FOR ERECTILE DYSFUNTION 10 tablet 3   No current facility-administered medications on file prior to visit.    BP 118/78 mmHg  Pulse 77  Temp(Src) 98.1 F (36.7 C) (Oral)  Wt 247 lb 9.6 oz (112.311 kg)  SpO2 98%       Objective:   Physical Exam  General  Mental Status - Alert. General Appearance - Well groomed. Not in acute distress.  Skin Rashes- No Rashes.  HEENT Head- Normal. Ear Auditory Canal - Left- Normal. Right - Normal.Tympanic Membrane- Left- Normal. Right- Normal. Eye  Sclera/Conjunctiva- Left- Normal. Right- Normal. Nose & Sinuses Nasal Mucosa- Left-  Boggy and Congested. Right-  Boggy and  Congested.Bilateral faint  maxillary and faint  frontal sinus pressure. Mouth & Throat Lips: Upper Lip- Normal: no dryness, cracking, pallor, cyanosis, or vesicular eruption. Lower Lip-Normal: no dryness, cracking, pallor, cyanosis or vesicular eruption. Buccal Mucosa- Bilateral- No Aphthous ulcers. Oropharynx- No Discharge or Erythema. Tonsils: Characteristics- Bilateral- No Erythema or Congestion. Size/Enlargement- Bilateral- No enlargement. Discharge- bilateral-None.  Neck Neck- Supple. No Masses.   Chest and Lung Exam Auscultation: Breath Sounds:-Clear even and unlabored.  Cardiovascular Auscultation:Rythm- Regular, rate and rhythm. Murmurs & [name] Heart Sounds:Ausculatation of the heart reveal- No Murmurs.  Lymphatic Head & Neck General Head & Neck Lymphatics: Bilateral: Description- No Localized lymphadenopathy.       Assessment & Plan:  You appear to have started with allergies and will rx flonase ans xyzal.  For cough will rx hycodan.  For early sinus infection and possible bronchitis rx azithromycin.  For wheezing will rx ventolin.  Follow up in 7 days or as needed  Robert [name], Percell Miller, [employer]

## 2016-02-17 NOTE — Progress Notes (Signed)
Pre visit review using our clinic review tool, if applicable. No additional management support is needed unless otherwise documented below in the visit note. 

## 2016-05-13 ENCOUNTER — Other Ambulatory Visit: Payer: Self-pay | Admitting: Family Medicine

## 2016-05-13 MED ORDER — LISINOPRIL 40 MG PO TABS: 60.0000 mg | ORAL_TABLET | Freq: Every day | ORAL | Status: DC

## 2016-08-11 ENCOUNTER — Other Ambulatory Visit: Payer: Self-pay | Admitting: Family Medicine

## 2016-11-14 ENCOUNTER — Other Ambulatory Visit: Payer: Self-pay | Admitting: Family Medicine

## 2017-01-02 ENCOUNTER — Other Ambulatory Visit: Payer: Self-pay | Admitting: Family Medicine

## 2017-01-03 ENCOUNTER — Telehealth: Payer: Self-pay | Admitting: Family Medicine

## 2017-01-03 NOTE — Telephone Encounter (Signed)
Sent to the pharmacy by e-scribe.  Pt due for yearly.  Will send a message to scheduling.

## 2017-01-03 NOTE — Telephone Encounter (Signed)
Pt due for yearly with Sherren Mocha.  Please schedule.  Recently told that labs will be ordered at the visit.  Have pt come fasting.  Thanks!!

## 2017-01-04 NOTE — Telephone Encounter (Signed)
Pt has been sch

## 2017-01-10 ENCOUNTER — Other Ambulatory Visit: Payer: Self-pay | Admitting: Family Medicine

## 2017-01-10 DIAGNOSIS — E785 Hyperlipidemia, unspecified: Secondary | ICD-10-CM

## 2017-03-01 ENCOUNTER — Ambulatory Visit (INDEPENDENT_AMBULATORY_CARE_PROVIDER_SITE_OTHER): Payer: Medicare Other | Admitting: Family Medicine

## 2017-03-01 ENCOUNTER — Encounter: Payer: Self-pay | Admitting: Family Medicine

## 2017-03-01 VITALS — BP 124/82 | Temp 98.6°F | Ht 73.25 in | Wt 236.0 lb

## 2017-03-01 DIAGNOSIS — N401 Enlarged prostate with lower urinary tract symptoms: Secondary | ICD-10-CM | POA: Diagnosis not present

## 2017-03-01 DIAGNOSIS — E785 Hyperlipidemia, unspecified: Secondary | ICD-10-CM | POA: Diagnosis not present

## 2017-03-01 DIAGNOSIS — F528 Other sexual dysfunction not due to a substance or known physiological condition: Secondary | ICD-10-CM | POA: Diagnosis not present

## 2017-03-01 DIAGNOSIS — E119 Type 2 diabetes mellitus without complications: Secondary | ICD-10-CM | POA: Diagnosis not present

## 2017-03-01 DIAGNOSIS — I10 Essential (primary) hypertension: Secondary | ICD-10-CM

## 2017-03-01 DIAGNOSIS — Z23 Encounter for immunization: Secondary | ICD-10-CM

## 2017-03-01 DIAGNOSIS — Z794 Long term (current) use of insulin: Secondary | ICD-10-CM

## 2017-03-01 DIAGNOSIS — Z136 Encounter for screening for cardiovascular disorders: Secondary | ICD-10-CM | POA: Diagnosis not present

## 2017-03-01 DIAGNOSIS — M79661 Pain in right lower leg: Secondary | ICD-10-CM

## 2017-03-01 DIAGNOSIS — R351 Nocturia: Secondary | ICD-10-CM

## 2017-03-01 LAB — POCT URINALYSIS DIPSTICK
Bilirubin, UA: NEGATIVE
GLUCOSE UA: NEGATIVE
Ketones, UA: NEGATIVE
Leukocytes, UA: NEGATIVE
Nitrite, UA: NEGATIVE
Protein, UA: NEGATIVE
RBC UA: NEGATIVE
UROBILINOGEN UA: 0.2 U/dL
pH, UA: 6 (ref 5.0–8.0)

## 2017-03-01 LAB — LIPID PANEL
Cholesterol: 139 mg/dL (ref 0–200)
HDL: 38.7 mg/dL — ABNORMAL LOW (ref >39.00)
LDL Cholesterol: 62 mg/dL (ref 0–99)
NonHDL: 99.93
Total CHOL/HDL Ratio: 4
Triglycerides: 192 mg/dL — ABNORMAL HIGH (ref 0.0–149.0)
VLDL: 38.4 mg/dL (ref 0.0–40.0)

## 2017-03-01 LAB — BASIC METABOLIC PANEL
BUN: 17 mg/dL (ref 6–23)
CHLORIDE: 103 meq/L (ref 96–112)
CO2: 27 mEq/L (ref 19–32)
CREATININE: 0.96 mg/dL (ref 0.40–1.50)
Calcium: 9.9 mg/dL (ref 8.4–10.5)
GFR: 83.16 mL/min (ref >60.00)
Glucose, Bld: 160 mg/dL — ABNORMAL HIGH (ref 70–99)
POTASSIUM: 4.8 meq/L (ref 3.5–5.1)
Sodium: 136 mEq/L (ref 135–145)

## 2017-03-01 LAB — PSA: PSA: 1.82 ng/mL (ref 0.10–4.00)

## 2017-03-01 LAB — MICROALBUMIN / CREATININE URINE RATIO
CREATININE, U: 207 mg/dL
MICROALB UR: 3.7 mg/dL — AB (ref 0.0–1.9)
Microalb Creat Ratio: 1.8 mg/g (ref 0.0–30.0)

## 2017-03-01 LAB — CBC WITH DIFFERENTIAL/PLATELET
BASOS PCT: 0.5 % (ref 0.0–3.0)
Basophils Absolute: 0.1 10*3/uL (ref 0.0–0.1)
EOS ABS: 0.1 10*3/uL (ref 0.0–0.7)
EOS PCT: 0.9 % (ref 0.0–5.0)
HCT: 46.1 % (ref 39.0–52.0)
HEMOGLOBIN: 15.8 g/dL (ref 13.0–17.0)
LYMPHS PCT: 37.5 % (ref 12.0–46.0)
Lymphs Abs: 3.6 10*3/uL (ref 0.7–4.0)
MCHC: 34.3 g/dL (ref 30.0–36.0)
MCV: 93.3 fl (ref 78.0–100.0)
Monocytes Absolute: 0.7 10*3/uL (ref 0.1–1.0)
Monocytes Relative: 7.7 % (ref 3.0–12.0)
Neutro Abs: 5.1 10*3/uL (ref 1.4–7.7)
Neutrophils Relative %: 53.4 % (ref 43.0–77.0)
Platelets: 207 10*3/uL (ref 150.0–400.0)
RBC: 4.94 Mil/uL (ref 4.22–5.81)
RDW: 13.6 % (ref 11.5–15.5)
WBC: 9.6 10*3/uL (ref 4.0–10.5)

## 2017-03-01 LAB — HEPATIC FUNCTION PANEL
ALBUMIN: 4.5 g/dL (ref 3.5–5.2)
ALT: 23 U/L (ref 0–53)
AST: 16 U/L (ref 0–37)
Alkaline Phosphatase: 57 U/L (ref 39–117)
Bilirubin, Direct: 0.1 mg/dL (ref 0.0–0.3)
TOTAL PROTEIN: 6.7 g/dL (ref 6.0–8.3)
Total Bilirubin: 0.7 mg/dL (ref 0.2–1.2)

## 2017-03-01 LAB — HEMOGLOBIN A1C: HEMOGLOBIN A1C: 7.3 % — AB (ref 4.6–6.5)

## 2017-03-01 LAB — TSH: TSH: 0.94 u[IU]/mL (ref 0.35–4.50)

## 2017-03-01 MED ORDER — SILDENAFIL CITRATE 20 MG PO TABS: ORAL_TABLET | ORAL | 11 refills | Status: DC

## 2017-03-01 MED ORDER — GLIPIZIDE 10 MG PO TABS: 10.0000 mg | ORAL_TABLET | Freq: Two times a day (BID) | ORAL | 4 refills | Status: DC

## 2017-03-01 MED ORDER — SILDENAFIL CITRATE 100 MG PO TABS: ORAL_TABLET | ORAL | 10 refills | Status: DC

## 2017-03-01 MED ORDER — INSULIN ASPART PROT & ASPART (70-30 MIX) 100 UNIT/ML ~~LOC~~ SUSP: 10.0000 [IU] | Freq: Every day | SUBCUTANEOUS | 11 refills | Status: DC

## 2017-03-01 MED ORDER — METFORMIN HCL 1000 MG PO TABS: 1000.0000 mg | ORAL_TABLET | Freq: Two times a day (BID) | ORAL | 2 refills | Status: DC

## 2017-03-01 MED ORDER — HYDROCHLOROTHIAZIDE 12.5 MG PO CAPS: 12.5000 mg | ORAL_CAPSULE | Freq: Every day | ORAL | 4 refills | Status: DC

## 2017-03-01 NOTE — Patient Instructions (Addendum)
Continue your current medications  We will get you set up for a vascular consult ASAP  Labs today........... I will call you next week to review the data to see if we need to make any changes in your medication and also to see when  we need to see follow-up  Generic Viagra...[name] KitchenMarland KitchenMarland Kitchen 30 tabs at [facility] for $45  Go to your pharmacy for the shingles vaccine

## 2017-03-01 NOTE — Progress Notes (Signed)
Robert [name] is a 66 year old married male nonsmoker who comes in today for evaluation of hyperlipidemia, diabetes type 1, hypertension, erectile dysfunction, BPH with nocturia,  He takes 40 mg of Lipitor Monday Wednesday Friday and an aspirin tablet daily. We'll check labs today  Her diabetes control he takes 10 mg of Glucotrol twice a day, metformin 1000 mg twice a day, and NovoLog mix 70-30 10 units at bedtime as needed. Since last year he's lost 12 pounds and his need for the evening insulin his markedly diminished. He brings in his blood sugar records. 2 hours after his evening meal his blood sugar is often in the 95-100 range. If he takes any insulin at that junction his blood sugar drops too low in the morning.  He's complaining of pain in his right leg with exercise. He wonders if he has peripheral vascular disease from the diabetes. He also has difficulty walking and wheeze mowing the yard he'll get pain in that right hip. He points the anterior portion of the hip as a source of the pain.  No skin changes. He swims without any difficulty  He takes lisinopril 60 mg daily for hypertension BP today 124/82  He is Viagra when necessary for ED  He also uses steroid nasal spray for allergic rhinitis and Hydrocort thiazide 12.5 mg daily for blood pressure control.  He gets routine eye care, dental care, colonoscopy 2016 normal.  She's also having discomfort in both legs from his varicose veins.  Vaccinations tetanus October 2008. Pneumovax given today information given on shingles vaccine. He did have the original shingles vaccine  14 point review of systems reviewed otherwise negative  EKG was done because a history of the above EKG was normal and unchanged.  BP 124/82 (BP Location: Left Arm)   Temp 98.6 F (37 C) (Oral)   Ht 6' 1.25" (1.861 m)   Wt 236 lb (107 kg)   BMI 30.92 kg/m  Examination HEENT were negative except for some very juvenile cataracts bilaterally. Neck was supple  thyroid not enlarged no carotid bruits. Cardiopulmonary exam normal abdominal exam normal extremities normal skin normal peripheral pulses normal except dorsalis pedis posterior tib and popliteal on the left are 2+ out of 2 on the right it's 1+ out of 2 palpable but diminished comparing right and left.  #1 diabetes type 1 with decreased need for insulin because of weight loss......... check labs  #2 hypertension at goal......... continue lisinopril  #3 hyperlipidemia....... continue Lipitor and aspirin check labs  #4 right leg pain with exertion. 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 Therefore begin with a vascular workup. If that's unrevealing then we'll go ahead and look at his hips. But I think the most important thing obviously is his vascular tree.  #5 erectile dysfunction....... continue Viagra when necessary  Next 6 BPH with nocturia....... check labs  #7 allergic rhinitis continue Flonase

## 2017-03-03 ENCOUNTER — Other Ambulatory Visit: Payer: Self-pay | Admitting: *Deleted

## 2017-03-03 DIAGNOSIS — I739 Peripheral vascular disease, unspecified: Secondary | ICD-10-CM

## 2017-03-07 NOTE — Addendum Note (Signed)
Addended by: [name] on: 03/07/2017 09:42 AM   Modules accepted: Orders

## 2017-03-07 NOTE — Addendum Note (Signed)
Addended by: [name] on: 03/07/2017 09:43 AM   Modules accepted: Orders

## 2017-03-27 ENCOUNTER — Encounter: Payer: Self-pay | Admitting: Vascular Surgery

## 2017-03-29 ENCOUNTER — Ambulatory Visit (INDEPENDENT_AMBULATORY_CARE_PROVIDER_SITE_OTHER): Payer: Medicare Other | Admitting: Vascular Surgery

## 2017-03-29 ENCOUNTER — Ambulatory Visit (HOSPITAL_COMMUNITY)
Admission: RE | Admit: 2017-03-29 | Discharge: 2017-03-29 | Disposition: A | Discharge status: Home / Self Care | Payer: Medicare Other | Source: Ambulatory Visit | Attending: Vascular Surgery | Admitting: Vascular Surgery

## 2017-03-29 ENCOUNTER — Encounter: Payer: Self-pay | Admitting: Vascular Surgery

## 2017-03-29 VITALS — BP 141/89 | HR 89 | Temp 98.4°F | Resp 18 | Ht 75.0 in | Wt 235.0 lb

## 2017-03-29 DIAGNOSIS — I739 Peripheral vascular disease, unspecified: Secondary | ICD-10-CM | POA: Insufficient documentation

## 2017-03-29 NOTE — Progress Notes (Signed)
Vitals:   03/29/17 1539  BP: (!) 154/88  Pulse: 86  Resp: 18  Temp: 98.4 F (36.9 C)  SpO2: 96%  Weight: 235 lb (106.6 kg)  Height: 6\' 3"  (1.905 m)

## 2017-03-29 NOTE — Progress Notes (Signed)
Referring Physician: Dr Sherren Mocha  Patient name: Robert [name] MRN: [id number] DOB: Nov 22, 1950 Sex: male  REASON FOR CONSULT: Right leg pain  HPI: Robert [name] is a 66 y.o. male with a 5 month history of tightness achiness in his right calf with walking. This is relieved by rest. He does not describe rest pain in his foot. He has no history of nonhealing wounds. He states that currently his symptoms are inconsistent that sometimes he can mow the grafts and have problems and then other times he can go his grafts with no symptoms whatsoever. He does smoke 1 cigar on the weekends usually he has done this for years. He was counseled against tobacco use today and informed of the risk of limb loss with diabetes and tobacco use together of 100 fold. Other medical problems include hypertension hyperlipidemia diabetes. These are all currently controlled. He is on aspirin and statin.  Past Medical History:  Diagnosis Date  . Diabetes mellitus type [name]   . HLD (hyperlipidemia)   . HTN (hypertension)   . Hx of colonic polyps   . Skin cancer    history of   Past Surgical History:  Procedure Laterality Date  . COLONOSCOPY    . PATELLAR TENDON REPAIR     Left  . Removal of excess tissue     Right and Left upper eyelids    Family History  Problem Relation Age of Onset  . Aortic aneurysm Unknown        family history  . Cancer Unknown        Breast (1st degree relative)  . Colon cancer Neg Hx     SOCIAL HISTORY: Social History   Social History  . Marital status: Married    Spouse name: N/A  . Number of children: N/A  . 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 Smoking status: Former Smoker    Types: Cigars    Quit date: 02/22/2011  . Smokeless tobacco: Never Used     Comment: 2 cigars monthly or less  . Alcohol use 0.0 oz/week     Comment: 6-8oz /wk  . Drug use: No  . Sexual  activity: Not on file   Other Topics Concern  . Not on file   Social History Narrative  . No narrative on file    No Known Allergies  Current Outpatient Prescriptions  Medication Sig Dispense Refill  . aspirin 81 MG EC tablet Take 81 mg by mouth daily.      [name] Kitchen atorvastatin (LIPITOR) 40 MG tablet TAKE 1 TABLET (40 MG TOTAL) BY MOUTH DAILY. 90 tablet 2  . Coenzyme Q10 (COQ10 PO) Take by mouth.    [name] Kitchen glipiZIDE (GLUCOTROL) 10 MG tablet Take 1 tablet (10 mg total) by mouth 2 (two) times daily before a meal. 200 tablet 4  . glucose blood test strip 1 each by Other route daily. Use daily for glucose control - Dx 250.00 100 each 0  . hydrochlorothiazide (MICROZIDE) 12.5 MG capsule Take 1 capsule (12.5 mg total) by mouth daily. 90 capsule 4  . insulin aspart protamine- aspart (NOVOLOG MIX 70/30) (70-30) 100 UNIT/ML injection Inject 0.1 mLs (10 Units total) into the skin daily. 10 mL 11  . lisinopril (PRINIVIL,ZESTRIL) 40 MG tablet TAKE 1 AND 1/2 TABLET BY MOUTH EVERY DAY 150 tablet 4  . metFORMIN (GLUCOPHAGE) 1000  MG tablet Take 1 tablet (1,000 mg total) by mouth 2 (two) times daily with a meal. 180 tablet 2  . sildenafil (REVATIO) 20 MG tablet Take 1 to 2 tabs 2 - 3 hours before sex 30 tablet 11  . sildenafil (VIAGRA) 100 MG tablet TAKE 1 TABLET BY MOUTH AS NEEDED FOR ERECTILE DYSFUNTION 10 tablet 10   No current facility-administered medications for this visit.     ROS:   General:  No weight loss, Fever, chills  HEENT: No recent headaches, no nasal bleeding, no visual changes, no sore throat  Neurologic: No dizziness, blackouts, seizures. No recent symptoms of stroke or mini- stroke. No recent episodes of slurred speech, or temporary blindness.  Cardiac: No recent episodes of chest pain/pressure, no shortness of breath at rest.  No shortness of breath with exertion.  Denies history of atrial fibrillation or irregular heartbeat  Vascular: No history of rest pain in feet.  No history of  claudication.  No history of non-healing ulcer, No history of DVT   Pulmonary: No home oxygen, no productive cough, no hemoptysis,  No asthma or wheezing  Musculoskeletal:  [ ]  Arthritis, [ ]  Low back pain,  [ ]  Joint pain  Hematologic:No history of hypercoagulable state.  No history of easy bleeding.  No history of anemia  Gastrointestinal: No hematochezia or melena,  No gastroesophageal reflux, no trouble swallowing  Urinary: [ ]  chronic Kidney disease, [ ]  on HD - [ ]  MWF or [ ]  TTHS, [ ]  Burning with urination, [ ]  Frequent urination, [ ]  Difficulty urinating;   Skin: No rashes  Psychological: No history of anxiety,  No history of depression   Physical Examination  There were no vitals filed for this visit.  There is no height or weight on file to calculate BMI.  General:  Alert and oriented, no acute distress HEENT: Normal Neck: No bruit or JVD Pulmonary: Clear to auscultation bilaterally Cardiac: Regular Rate and Rhythm without murmur Abdomen: Soft, non-tender, non-distended, no mass, no scars Skin: No rash Extremity Pulses:  2+ radial, brachial, femoral, 2+ dorsalis pedis, posterior tibial pulses left leg absent dorsalis pedis posterior tibial pulse right leg Musculoskeletal: No deformity or edema  Neurologic: Upper and lower extremity motor 5/5 and symmetric  DATA:  Patient had bilateral ABIs performed today. Right side was 0.8 with a toe pressure 76 left side was 1.05 with a toe pressure of 86. Triphasic waveforms bilaterally.  ASSESSMENT:  Patient with mild nondisabling claudication symptoms right lower extremity. 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 His symptoms currently are fairly inconsistent and not really disabling to him.   PLAN:  #1 smoking cessation number to control risk factors of hypertension elevated cholesterol and diabetes #3 walking program of 30 minutes daily #4 the patient will follow-up with repeat  ABIs and see our nurse practitioner in 6 months.  I did discuss with the patient possibility of an arteriogram at some point future if his symptoms become more disabling but currently I do not believe that is warranted.   Ruta Hinds, MD [facility] Office: [phone] Pager: [phone]

## 2017-04-13 NOTE — Addendum Note (Signed)
Addended by: Lianne Cure A on: 04/13/2017 03:38 PM   Modules accepted: Orders

## 2017-04-28 ENCOUNTER — Telehealth: Payer: Self-pay | Admitting: Family Medicine

## 2017-04-28 NOTE — Telephone Encounter (Signed)
Pt would like to see if a PA could be done for his Rx sildenafil.

## 2017-05-01 NOTE — Telephone Encounter (Signed)
Key: [id number]

## 2017-05-02 NOTE — Telephone Encounter (Signed)
PA denied, medicare will not cover ED medications with a dx of Erectile Dysfunction.

## 2017-05-03 NOTE — Telephone Encounter (Signed)
Left a message for the pt to return my call.  

## 2017-05-09 NOTE — Telephone Encounter (Signed)
Left a message on home/cell for a return call. 

## 2017-05-10 NOTE — Telephone Encounter (Signed)
Pt notified that prior authorization was denied.

## 2017-07-05 ENCOUNTER — Other Ambulatory Visit (INDEPENDENT_AMBULATORY_CARE_PROVIDER_SITE_OTHER): Payer: Medicare Other

## 2017-07-05 DIAGNOSIS — Z794 Long term (current) use of insulin: Secondary | ICD-10-CM | POA: Diagnosis not present

## 2017-07-05 DIAGNOSIS — E119 Type 2 diabetes mellitus without complications: Secondary | ICD-10-CM

## 2017-07-05 DIAGNOSIS — I10 Essential (primary) hypertension: Secondary | ICD-10-CM

## 2017-07-05 LAB — BASIC METABOLIC PANEL
BUN: 19 mg/dL (ref 6–23)
CHLORIDE: 102 meq/L (ref 96–112)
CO2: 27 mEq/L (ref 19–32)
Calcium: 9.8 mg/dL (ref 8.4–10.5)
Creatinine, Ser: 0.99 mg/dL (ref 0.40–1.50)
GFR: 80.17 mL/min (ref >60.00)
Glucose, Bld: 168 mg/dL — ABNORMAL HIGH (ref 70–99)
POTASSIUM: 4.7 meq/L (ref 3.5–5.1)
Sodium: 138 mEq/L (ref 135–145)

## 2017-07-05 LAB — HEMOGLOBIN A1C: HEMOGLOBIN A1C: 7.7 % — AB (ref 4.6–6.5)

## 2017-07-10 ENCOUNTER — Encounter: Payer: Self-pay | Admitting: Family Medicine

## 2017-07-10 ENCOUNTER — Ambulatory Visit (INDEPENDENT_AMBULATORY_CARE_PROVIDER_SITE_OTHER): Payer: Medicare Other | Admitting: Family Medicine

## 2017-07-10 VITALS — BP 130/80 | HR 74 | Temp 98.3°F | Wt 239.0 lb

## 2017-07-10 DIAGNOSIS — Z794 Long term (current) use of insulin: Secondary | ICD-10-CM | POA: Diagnosis not present

## 2017-07-10 DIAGNOSIS — Z23 Encounter for immunization: Secondary | ICD-10-CM | POA: Diagnosis not present

## 2017-07-10 DIAGNOSIS — E119 Type 2 diabetes mellitus without complications: Secondary | ICD-10-CM | POA: Diagnosis not present

## 2017-07-10 NOTE — Progress Notes (Signed)
Robert [name]  is a 66 year old married male nonsmoker who comes in today for follow-up of diabetes type 2  He's on 10 mg of Glucotrol twice a day metformin 1000 mg twice a day and insulin if his sugar gets too high. He'll take 10 units at bedtime when necessary once or twice a week. Recent A1c is up from 7.3-7.7%.  We did his physical exam last spring. He had some claudication symptoms. I sent him for a PV consult. He saw Dr. Oneida Alar who felt indeed he did have some minor claudication but it wasn't bad enough to do any further diagnostic studies. He's advised to go back when necessary  He exercises on a regular basis but hasn't lost any weight  BP 130/80 (BP Location: Left Arm, Patient Position: Sitting, Cuff Size: Normal)   Pulse 74   Temp 98.3 F (36.8 C) (Oral)   Wt 239 lb (108.4 kg)   SpO2 96%   BMI 29.87 kg/m  In general he is well-developed well-nourished male no acute distress vital signs stable indeed BP good 130/80.  #1 diabetes not at goal......... continue oral meds........ add 6 units of insulin twice a day follow-up A1c in 3 months

## 2017-07-10 NOTE — Patient Instructions (Signed)
Continue current diet and exercise program  Continue oral medications  Take 6 units of insulin before breakfast and 6 units before your evening meal  Follow-up labs in 3 months......... office visit 1 week after labs

## 2017-07-28 ENCOUNTER — Ambulatory Visit (INDEPENDENT_AMBULATORY_CARE_PROVIDER_SITE_OTHER): Payer: Medicare Other | Admitting: Family Medicine

## 2017-07-28 ENCOUNTER — Encounter: Payer: Self-pay | Admitting: Family Medicine

## 2017-07-28 VITALS — BP 128/80 | HR 84 | Temp 98.5°F | Ht 75.0 in | Wt 239.0 lb

## 2017-07-28 DIAGNOSIS — J069 Acute upper respiratory infection, unspecified: Secondary | ICD-10-CM | POA: Diagnosis not present

## 2017-07-28 NOTE — Patient Instructions (Signed)
INSTRUCTIONS FOR UPPER RESPIRATORY INFECTION:  -plenty of rest and fluids  -nasal saline wash 2-3 times daily (use prepackaged nasal saline or bottled/distilled water if making your own)   -can use AFRIN nasal spray for drainage and nasal congestion - but do NOT use longer then 3-4 days  -can use flonase 2 sprays each nostril daily for a few weeks  -can use tylenol (in no history of liver disease) or ibuprofen (if no history of kidney disease, bowel bleeding or significant heart disease) as directed for aches and sorethroat  -in the winter time, using a humidifier at night is helpful (please follow cleaning instructions)  -if you are taking a cough medication - use only as directed, may also try a teaspoon of honey to coat the throat and throat lozenges.  -for sore throat, salt water gargles can help  -follow up if you have fevers, facial pain, tooth pain, difficulty breathing or are worsening or symptoms persist longer then expected  Upper Respiratory Infection, Adult An upper respiratory infection (URI) is also known as the common cold. It is often caused by a type of germ (virus). Colds are easily spread (contagious). You can pass it to others by kissing, coughing, sneezing, or drinking out of the same glass. Usually, you get better in 1 to 3  weeks.  However, the cough can last for even longer. HOME CARE   Only take medicine as told by your doctor. Follow instructions provided above.  Drink enough water and fluids to keep your pee (urine) clear or pale yellow.  Get plenty of rest.  Return to work when your temperature is < 100 for 24 hours or as told by your doctor. You may use a face mask and wash your hands to stop your cold from spreading. GET HELP RIGHT AWAY IF:   After the first few days, you feel you are getting worse.  You have questions about your medicine.  You have chills, shortness of breath, or red spit (mucus).  You have pain in the face for more then 1-2 days,  especially when you bend forward.  You have a fever, puffy (swollen) neck, pain when you swallow, or white spots in the back of your throat.  You have a bad headache, ear pain, sinus pain, or chest pain.  You have a high-pitched whistling sound when you breathe in and out (wheezing).  You cough up blood.  You have sore muscles or a stiff neck. MAKE SURE YOU:   Understand these instructions.  Will watch your condition.  Will get help right away if you are not doing well or get worse. Document Released: 03/28/2008 Document Revised: 01/02/2012 Document Reviewed: 01/15/2014 [facility] Patient Information 2015 [place], [place]. This information is not intended to replace advice given to you by your health care provider. Make sure you discuss any questions you have with your health care provider.

## 2017-07-28 NOTE — Progress Notes (Signed)
HPI:  URI: -started: yesterday -symptoms:nasal congestion, sore throat, cough -denies:fever, SOB, NVD, tooth pain -has tried: nothing -sick contacts/travel/risks: no reported flu, strep or tick exposure -Hx of: allergies - has flonase - not taking  ROS: See pertinent positives and negatives per HPI.  Past Medical History:  Diagnosis Date  . Diabetes mellitus type II   . HLD (hyperlipidemia)   . HTN (hypertension)   . Hx of colonic polyps   . Skin cancer    history of    Past Surgical History:  Procedure Laterality Date  . COLONOSCOPY    . PATELLAR TENDON REPAIR     Left  . Removal of excess tissue     Right and Left upper eyelids    Family History  Problem Relation Age of Onset  . Aortic aneurysm Unknown        family history  . Cancer Unknown        Breast (1st degree relative)  . Colon cancer Neg Hx     Social History   Social History  . Marital status: Married    Spouse name: N/A  . Number of children: N/A  . 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 Smoking status: Former Smoker    Types: Cigars    Quit date: 02/22/2011  . Smokeless tobacco: Never Used     Comment: 2 cigars monthly or less  . Alcohol use 0.0 oz/week     Comment: 6-8oz /wk  . Drug use: No  . Sexual activity: Not Asked   Other Topics Concern  . None   Social History Narrative  . None     Current Outpatient Prescriptions:  .  aspirin 81 MG EC tablet, Take 81 mg by mouth daily.  , Disp: , Rfl:  .  atorvastatin (LIPITOR) 40 MG tablet, TAKE 1 TABLET (40 MG TOTAL) BY MOUTH DAILY., Disp: 90 tablet, Rfl: 2 .  Coenzyme Q10 (COQ10 PO), Take by mouth., Disp: , Rfl:  .  glipiZIDE (GLUCOTROL) 10 MG tablet, Take 1 tablet (10 mg total) by mouth 2 (two) times daily before a meal., Disp: 200 tablet, Rfl: 4 .  glucose blood test strip, 1 each by Other route daily. Use daily for glucose control - Dx 250.00,  Disp: 100 each, Rfl: 0 .  hydrochlorothiazide (MICROZIDE) 12.5 MG capsule, Take 1 capsule (12.5 mg total) by mouth daily., Disp: 90 capsule, Rfl: 4 .  insulin aspart protamine- aspart (NOVOLOG MIX 70/30) (70-30) 100 UNIT/ML injection, Inject 0.1 mLs (10 Units total) into the skin daily., Disp: 10 mL, Rfl: 11 .  lisinopril (PRINIVIL,ZESTRIL) 40 MG tablet, TAKE 1 AND 1/2 TABLET BY MOUTH EVERY DAY, Disp: 150 tablet, Rfl: 4 .  metFORMIN (GLUCOPHAGE) 1000 MG tablet, Take 1 tablet (1,000 mg total) by mouth 2 (two) times daily with a meal., Disp: 180 tablet, Rfl: 2 .  sildenafil (REVATIO) 20 MG tablet, Take 1 to 2 tabs 2 - 3 hours before sex, Disp: 30 tablet, Rfl: 11 .  sildenafil (VIAGRA) 100 MG tablet, TAKE 1 TABLET BY MOUTH AS NEEDED FOR ERECTILE DYSFUNTION, Disp: 10 tablet, Rfl: 10  EXAM:  Vitals:   07/28/17 1031  BP: 128/80  Pulse: 84  Temp: 98.5 F (36.9 C)  SpO2: 98%    Body mass index is 29.87 kg/m.  GENERAL: vitals reviewed and listed above, alert, oriented, appears well hydrated and  in no acute distress  HEENT: atraumatic, conjunttiva clear, no obvious abnormalities on inspection of external nose and ears, normal appearance of ear canals and TMs, clear nasal congestion, mild post oropharyngeal erythema with PND, no tonsillar edema or exudate, no sinus TTP  NECK: no obvious masses on inspection  LUNGS: clear to auscultation bilaterally, no wheezes, rales or rhonchi, good air movement  CV: HRRR, no peripheral edema  MS: moves all extremities without noticeable abnormality  PSYCH: pleasant and cooperative, no obvious depression or anxiety  ASSESSMENT AND PLAN:  Discussed the following assessment and plan:  Viral upper respiratory tract infection  -given HPI and exam findings today, a serious infection or illness is unlikely. We discussed potential etiologies, with VURI being most likely, and advised supportive care and monitoring. We discussed treatment side effects, likely  course, antibiotic misuse, transmission, and signs of developing a serious illness. -of course, we advised to return or notify a doctor immediately if symptoms worsen or persist or new concerns arise.    Patient Instructions  INSTRUCTIONS FOR UPPER RESPIRATORY INFECTION:  -plenty of rest and fluids  -nasal saline wash 2-3 times daily (use prepackaged nasal saline or bottled/distilled water if making your own)   -can use AFRIN nasal spray for drainage and nasal congestion - but do NOT use longer then 3-4 days  -can use flonase 2 sprays each nostril daily for a few weeks  -can use tylenol (in no history of liver disease) or ibuprofen (if no history of kidney disease, bowel bleeding or significant heart disease) as directed for aches and sorethroat  -in the winter time, using a humidifier at night is helpful (please follow cleaning instructions)  -if you are taking a cough medication - use only as directed, may also try a teaspoon of honey to coat the throat and throat lozenges.  -for sore throat, salt water gargles can help  -follow up if you have fevers, facial pain, tooth pain, difficulty breathing or are worsening or symptoms persist longer then expected  Upper Respiratory Infection, Adult An upper respiratory infection (URI) is also known as the common cold. It is often caused by a type of germ (virus). Colds are easily spread (contagious). You can pass it to others by kissing, coughing, sneezing, or drinking out of the same glass. Usually, you get better in 1 to 3  weeks.  However, the cough can last for even longer. HOME CARE   Only take medicine as told by your doctor. Follow instructions provided above.  Drink enough water and fluids to keep your pee (urine) clear or pale yellow.  Get plenty of rest.  Return to work when your temperature is < 100 for 24 hours or as told by your doctor. You may use a face mask and wash your hands to stop your cold from spreading. GET HELP  RIGHT AWAY IF:   After the first few days, you feel you are getting worse.  You have questions about your medicine.  You have chills, shortness of breath, or red spit (mucus).  You have pain in the face for more then 1-2 days, especially when you bend forward.  You have a fever, puffy (swollen) neck, pain when you swallow, or white spots in the back of your throat.  You have a bad headache, ear pain, sinus pain, or chest pain.  You have a high-pitched whistling sound when you breathe in and out (wheezing).  You cough up blood.  You have sore muscles or a stiff neck.  MAKE SURE YOU:   Understand these instructions.  Will watch your condition.  Will get help right away if you are not doing well or get worse. Document Released: 03/28/2008 Document Revised: 01/02/2012 Document Reviewed: 01/15/2014 [facility] Patient Information 2015 [place], [place]. This information is not intended to replace advice given to you by your health care provider. Make sure you discuss any questions you have with your health care provider.    Colin Benton R., DO

## 2017-07-29 DIAGNOSIS — B9789 Other viral agents as the cause of diseases classified elsewhere: Secondary | ICD-10-CM | POA: Diagnosis not present

## 2017-07-29 DIAGNOSIS — I10 Essential (primary) hypertension: Secondary | ICD-10-CM | POA: Diagnosis not present

## 2017-07-29 DIAGNOSIS — J069 Acute upper respiratory infection, unspecified: Secondary | ICD-10-CM | POA: Diagnosis not present

## 2017-08-07 DIAGNOSIS — J069 Acute upper respiratory infection, unspecified: Secondary | ICD-10-CM | POA: Diagnosis not present

## 2017-08-22 DIAGNOSIS — S00412A Abrasion of left ear, initial encounter: Secondary | ICD-10-CM | POA: Diagnosis not present

## 2017-08-22 DIAGNOSIS — R030 Elevated blood-pressure reading, without diagnosis of hypertension: Secondary | ICD-10-CM | POA: Diagnosis not present

## 2017-08-22 DIAGNOSIS — H6983 Other specified disorders of Eustachian tube, bilateral: Secondary | ICD-10-CM | POA: Diagnosis not present

## 2017-09-13 DIAGNOSIS — R002 Palpitations: Secondary | ICD-10-CM | POA: Diagnosis not present

## 2017-09-13 DIAGNOSIS — I10 Essential (primary) hypertension: Secondary | ICD-10-CM | POA: Diagnosis not present

## 2017-09-13 DIAGNOSIS — E119 Type 2 diabetes mellitus without complications: Secondary | ICD-10-CM | POA: Diagnosis not present

## 2017-09-13 DIAGNOSIS — J984 Other disorders of lung: Secondary | ICD-10-CM | POA: Diagnosis not present

## 2017-09-13 DIAGNOSIS — Z794 Long term (current) use of insulin: Secondary | ICD-10-CM | POA: Diagnosis not present

## 2017-09-20 ENCOUNTER — Encounter: Payer: Self-pay | Admitting: Family Medicine

## 2017-09-20 ENCOUNTER — Ambulatory Visit (INDEPENDENT_AMBULATORY_CARE_PROVIDER_SITE_OTHER): Payer: Medicare Other | Admitting: Family Medicine

## 2017-09-20 VITALS — BP 120/80 | HR 84 | Temp 97.8°F | Wt 238.0 lb

## 2017-09-20 DIAGNOSIS — I779 Disorder of arteries and arterioles, unspecified: Secondary | ICD-10-CM

## 2017-09-20 DIAGNOSIS — I499 Cardiac arrhythmia, unspecified: Secondary | ICD-10-CM

## 2017-09-20 MED ORDER — ATENOLOL 25 MG PO TABS: ORAL_TABLET | ORAL | 3 refills | Status: DC

## 2017-09-20 NOTE — Patient Instructions (Signed)
We will set you up for an event monitor ASAP  I will call you the report  If it does recur,,,,,,,,,, take atenolol 25 mg stat

## 2017-09-20 NOTE — Progress Notes (Signed)
Robert [name] is a 66 year old married male nonsmoker who comes in today for evaluation of rapid heart rate  November 19 he woke up around 3:00 in the morning to urinate and noticed his heart rate was rapid. He checked his pulse it was 143. He was otherwise asymptomatic. It lasted for 20 minutes then stop.  On November 22 it recurred around 11 PM at night. No triggers in either event. He went to the local emergency room in [facility] however by the time he got there his heart rate did drop back to normal. He was seen in the emergency room For 4 hours. He was placed on a monitor labs were done which were normal EKG was normal troponins were all negative.  He's not Robert [name] using any cough or cold medicines no pseudoephedrine and no caffeine.  He's never had episodes like this in the past.BP 120/80 (BP Location: Left Arm, Patient Position: Sitting, Cuff Size: Large)   Pulse 84   Temp 97.8 F (36.6 C) (Oral)   Wt 238 lb (108 kg)   SpO2 97%   BMI 29.75 kg/m  He's well-developed well-nourished male no acute distress vital signs stable he is afebrile cardiopulmonary exam normal  EKG pending  #1,,,,,,,,,,,,2 episodes of rapid heart rate,,,,,,,,, of that monitor for diagnostic evaluation,,,

## 2017-09-29 ENCOUNTER — Other Ambulatory Visit: Payer: Self-pay | Admitting: Family Medicine

## 2017-09-29 DIAGNOSIS — I499 Cardiac arrhythmia, unspecified: Secondary | ICD-10-CM

## 2017-09-29 DIAGNOSIS — R000 Tachycardia, unspecified: Secondary | ICD-10-CM

## 2017-10-02 ENCOUNTER — Other Ambulatory Visit: Payer: Medicare Other

## 2017-10-04 ENCOUNTER — Other Ambulatory Visit (INDEPENDENT_AMBULATORY_CARE_PROVIDER_SITE_OTHER): Payer: Medicare Other

## 2017-10-04 DIAGNOSIS — E119 Type 2 diabetes mellitus without complications: Secondary | ICD-10-CM

## 2017-10-04 DIAGNOSIS — Z794 Long term (current) use of insulin: Secondary | ICD-10-CM | POA: Diagnosis not present

## 2017-10-04 LAB — BASIC METABOLIC PANEL
BUN: 19 mg/dL (ref 6–23)
CALCIUM: 9.4 mg/dL (ref 8.4–10.5)
CHLORIDE: 99 meq/L (ref 96–112)
CO2: 25 meq/L (ref 19–32)
Creatinine, Ser: 1.02 mg/dL (ref 0.40–1.50)
GFR: 77.4 mL/min (ref >60.00)
Glucose, Bld: 224 mg/dL — ABNORMAL HIGH (ref 70–99)
Potassium: 4.6 mEq/L (ref 3.5–5.1)
Sodium: 134 mEq/L — ABNORMAL LOW (ref 135–145)

## 2017-10-04 LAB — HEMOGLOBIN A1C: HEMOGLOBIN A1C: 7.6 % — AB (ref 4.6–6.5)

## 2017-10-05 ENCOUNTER — Encounter: Payer: Self-pay | Admitting: Family

## 2017-10-05 ENCOUNTER — Ambulatory Visit (INDEPENDENT_AMBULATORY_CARE_PROVIDER_SITE_OTHER): Payer: Medicare Other | Admitting: Family

## 2017-10-05 ENCOUNTER — Ambulatory Visit (HOSPITAL_COMMUNITY)
Admission: RE | Admit: 2017-10-05 | Discharge: 2017-10-05 | Disposition: A | Discharge status: Home / Self Care | Payer: Medicare Other | Source: Ambulatory Visit | Attending: Vascular Surgery | Admitting: Vascular Surgery

## 2017-10-05 VITALS — BP 116/69 | HR 88 | Temp 98.5°F | Resp 18 | Wt 245.8 lb

## 2017-10-05 DIAGNOSIS — R9439 Abnormal result of other cardiovascular function study: Secondary | ICD-10-CM | POA: Diagnosis not present

## 2017-10-05 DIAGNOSIS — I779 Disorder of arteries and arterioles, unspecified: Secondary | ICD-10-CM

## 2017-10-05 DIAGNOSIS — I739 Peripheral vascular disease, unspecified: Secondary | ICD-10-CM | POA: Diagnosis not present

## 2017-10-05 NOTE — Patient Instructions (Signed)

## 2017-10-05 NOTE — Progress Notes (Signed)
[facility] [place]   CC: Follow up peripheral artery occlusive disease  History of Present Illness Robert [name] is a 66 y.o. male whom Dr. Oneida Alar saw on initial evaluation on 03-29-17, at that time he had a 5 month history of tightness achiness in his right calf with walking. This was relieved by rest. He does not describe rest pain in his foot. He has no history of nonhealing wounds. He states that  his symptoms were inconsistent that sometimes he can mow the grass and have problems and then other times he can mow his grass with no symptoms whatsoever. He smoked 1 cigar on the weekends, has done this for years, but he quit after seeing D. Fields at the initial visit. Dr. Oneida Alar counseled pt against tobacco use and informed of the risk of limb loss with diabetes and tobacco use together of 100 fold. Other medical problems include hypertension and hyperlipidemia.   At his initial visit, Dr. Oneida Alar assessment was patient had mild nondisabling claudication symptoms of right lower extremity. 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 His symptoms were fairly inconsistent and not really disabling to him. Walking program of 30 minutes daily, follow-up with repeat ABIs and see our nurse practitioner in 6 months. Dr. Oneida Alar discussed with the patient the possibility of an arteriogram at some point future if his symptoms become more disabling but currently not warranted.  He swims 5x/week. He walks 30 minutes 3-4 days/ week.  He no longer has claudication sx's in his legs with walking.   He denies any history of stroke or TIA.    Diabetic: Yes, A1C was 7.6 on 10-04-17 (review of records), his medications were adjusted since the last A1C Tobacco use: former smoker, quit in June 2018, stopped cigarettes about 2014, started "as a kid"  Pt meds include: Statin :Yes Betablocker: Yes ASA: Yes Other anticoagulants/antiplatelets:  no  Past Medical History:  Diagnosis Date  . Diabetes mellitus type [name]   . HLD (hyperlipidemia)   . HTN (hypertension)   . Hx of colonic polyps   . Skin cancer    history of    Social History Social History   Tobacco Use  . Smoking status: Former Smoker    Types: Cigars    Last attempt to quit: 02/22/2011    Years since quitting: 6.6  . Smokeless tobacco: Never Used  . Tobacco comment: 2 cigars monthly or less  Substance Use Topics  . Alcohol use: Yes    Alcohol/week: 0.0 oz    Comment: 6-8oz /wk  . Drug use: No    Family History Family History  Problem Relation Age of Onset  . Aortic aneurysm Unknown        family history  . Cancer Unknown        Breast (1st degree relative)  . Colon cancer Neg Hx     Past Surgical History:  Procedure Laterality Date  . COLONOSCOPY    . PATELLAR TENDON REPAIR     Left  . Removal of excess tissue     Right and Left upper eyelids    No Known Allergies  Current Outpatient Medications  Medication Sig Dispense Refill  . aspirin 81 MG EC tablet Take 81 mg by mouth daily.      [name] Kitchen atenolol (TENORMIN) 25 MG tablet 1 tablet stat for rapid heart rate 30 tablet 3  . atorvastatin (LIPITOR) 40 MG tablet TAKE 1 TABLET (40  MG TOTAL) BY MOUTH DAILY. 90 tablet 2  . Coenzyme Q10 (COQ10 PO) Take by mouth.    [name] Kitchen glipiZIDE (GLUCOTROL) 10 MG tablet Take 1 tablet (10 mg total) by mouth 2 (two) times daily before a meal. 200 tablet 4  . glucose blood test strip 1 each by Other route daily. Use daily for glucose control - Dx 250.00 100 each 0  . hydrochlorothiazide (MICROZIDE) 12.5 MG capsule Take 1 capsule (12.5 mg total) by mouth daily. 90 capsule 4  . insulin aspart protamine- aspart (NOVOLOG MIX 70/30) (70-30) 100 UNIT/ML injection Inject 0.1 mLs (10 Units total) into the skin daily. 10 mL 11  . lisinopril (PRINIVIL,ZESTRIL) 40 MG tablet TAKE 1 AND 1/2 TABLET BY MOUTH EVERY DAY 150 tablet 4  . metFORMIN (GLUCOPHAGE) 1000 MG tablet Take 1 tablet  (1,000 mg total) by mouth 2 (two) times daily with a meal. 180 tablet 2  . sildenafil (REVATIO) 20 MG tablet Take 1 to 2 tabs 2 - 3 hours before sex 30 tablet 11  . sildenafil (VIAGRA) 100 MG tablet TAKE 1 TABLET BY MOUTH AS NEEDED FOR ERECTILE DYSFUNTION 10 tablet 10   No current facility-administered medications for this visit.     ROS: See HPI for pertinent positives and negatives.   Physical Examination  Vitals:   10/05/17 1533 10/05/17 1536  BP: 115/77 116/69  Pulse: 88   Resp: 18   Temp: 98.5 F (36.9 C)   TempSrc: Oral   SpO2: 96%   Weight: 245 lb 12.8 oz (111.5 kg)    Body mass index is 30.72 kg/m.  General: A&O x 3, WDWN, obese male. Gait: normal Eyes: PERRLA. Pulmonary: Respirations are non labored, CTAB, good air movement in all fields Cardiac: regular rhythm and rate, no detected murmur.         Carotid Bruits Right Left   Negative Negative   Radial pulses are 2+ palpable bilaterally   Adominal aortic pulse is not palpable                         VASCULAR EXAM: Extremities without ischemic changes, without Gangrene; without open wounds.                                                                                                          LE Pulses Right Left       FEMORAL  3+ palpable  3+ palpable        POPLITEAL  not palpable   not palpable       POSTERIOR TIBIAL  not palpable   not palpable        DORSALIS PEDIS      ANTERIOR TIBIAL not palpable  2+ palpable    Abdomen: soft, NT, no palpable masses. Skin: no rashes, no ulcers noted. Musculoskeletal: no muscle wasting or atrophy.  Neurologic: A&O X 3; Appropriate Affect ; SENSATION: normal; MOTOR FUNCTION:  moving all extremities equally, motor strength 5/5 throughout. Speech is fluent/normal. CN 2-12 intact.    ASSESSMENT: Robert  WHITTAKER LENIS [name] is a 66 y.o. male who had a history of tightness/achiness in his right calf with walking, but this has resolved since D. Fields evaluated pt in  June 2018.  He is walking 3-4 days/week, and swimming 5 days/week. He also stopped cigar smoking, had already stopped cigarette use about 2014.   His atherosclerotic risk factors include almost in control DM, tobacco use from a young age until June 2018, obesity, and dyslipidemia.   DATA  ABI (Date: 10/05/2017):  R:   ABI: 0.74 (was 0.80 on 03-29-17),   PT: bi  DP: bi  TBI:  0.46 (was 0.54)  L:   ABI: 1.00 (was 1.05),   PT: tri  DP: tri  TBI: 0.43 (was 0.61) Stable bilateral ABI with biphasic signals on the right, triphasic on the left. Moderate disease on the right, no significant disease on the left. Decline in bilateral TBI.   PLAN:  Based on the patient's vascular studies and examination, pt will return to clinic in 9 months with ABI's. Discussed walking at least 30 minutes daily in a safe environment.   I discussed in depth with the patient the nature of atherosclerosis, and emphasized the importance of maximal medical management including strict control of blood pressure, blood glucose, and lipid levels, obtaining regular exercise, and continued cessation of smoking.  The patient is aware that without maximal medical management the underlying atherosclerotic disease process will progress, limiting the benefit of any interventions.  The patient was given information about PAD including signs, symptoms, treatment, what symptoms should prompt the patient to seek immediate medical care, and risk reduction measures to take.  Clemon Chambers, RN, MSN, FNP-C [facility] Phone: [phone]  Clinic MD: Oneida Alar  10/05/17 3:47 PM

## 2017-10-06 NOTE — Addendum Note (Signed)
Addended by: Lousie Calico A on: 10/06/2017 11:17 AM   Modules accepted: Orders  

## 2017-10-09 ENCOUNTER — Encounter: Payer: Self-pay | Admitting: Family Medicine

## 2017-10-09 ENCOUNTER — Other Ambulatory Visit: Payer: Medicare Other

## 2017-10-09 ENCOUNTER — Ambulatory Visit (INDEPENDENT_AMBULATORY_CARE_PROVIDER_SITE_OTHER): Payer: Medicare Other | Admitting: Family Medicine

## 2017-10-09 VITALS — BP 138/82 | HR 76 | Temp 97.6°F | Wt 244.0 lb

## 2017-10-09 DIAGNOSIS — E119 Type 2 diabetes mellitus without complications: Secondary | ICD-10-CM | POA: Diagnosis not present

## 2017-10-09 DIAGNOSIS — I779 Disorder of arteries and arterioles, unspecified: Secondary | ICD-10-CM

## 2017-10-09 DIAGNOSIS — Z794 Long term (current) use of insulin: Secondary | ICD-10-CM

## 2017-10-09 NOTE — Progress Notes (Signed)
Robert [name]  is a 66 year old married male nonsmoker who comes in today for follow-up of diabetes  He has been on increasing dose of oral agents for the past couple years. About a year ago we started him on insulin because he maxed out on oral agents. Now on 6 units of insulin twice daily blood sugar 124 A1c 7.6.  Weight is steady  [facility]  Robert [name] long discussion about different treatment options. At this juncture we will increase his insulin by 2 units every week until his blood sugar drops to 120 and also get a consult from Robert [name] to see if any of the new medications might be of value to him.  BP 138/82 (BP Location: Left Arm, Patient Position: Sitting, Cuff Size: Normal)   Pulse 76   Temp 97.6 F (36.4 C) (Oral)   Wt 244 lb (110.7 kg)   BMI 30.50 kg/m  Well-developed well-nourished male no acute distress vital signs stable he is afebrile  #1 diabetes type 1 uncontrolled............. continue diet Size.....[name] Kitchen increase insulin to 10 units twice a day........ and increased by 2 units in the morning in the afternoon......[name] Kitchen every week until blood sugar drops to 120....... consult with [name] to discuss other treatment options.[name] Kitchen

## 2017-10-09 NOTE — Patient Instructions (Signed)
Increase your insulin to 10 units twice daily  Check your blood sugar daily  Increase your insulin by 2 units in the morning and at noon every 7 days until your blood sugar drops to 120  We will arrange a consult with Dr. Lorie Apley did discuss other treatment options

## 2017-10-11 ENCOUNTER — Ambulatory Visit (INDEPENDENT_AMBULATORY_CARE_PROVIDER_SITE_OTHER): Payer: Medicare Other

## 2017-10-11 DIAGNOSIS — R000 Tachycardia, unspecified: Secondary | ICD-10-CM

## 2017-10-11 DIAGNOSIS — I499 Cardiac arrhythmia, unspecified: Secondary | ICD-10-CM

## 2017-11-06 ENCOUNTER — Telehealth: Payer: Self-pay

## 2017-11-06 ENCOUNTER — Telehealth: Payer: Self-pay | Admitting: *Deleted

## 2017-11-06 ENCOUNTER — Encounter: Payer: Self-pay | Admitting: Cardiovascular Disease

## 2017-11-06 ENCOUNTER — Encounter (INDEPENDENT_AMBULATORY_CARE_PROVIDER_SITE_OTHER): Payer: Self-pay

## 2017-11-06 ENCOUNTER — Ambulatory Visit (INDEPENDENT_AMBULATORY_CARE_PROVIDER_SITE_OTHER): Payer: Medicare Other | Admitting: Cardiovascular Disease

## 2017-11-06 VITALS — BP 134/76 | HR 52 | Ht 75.0 in | Wt 244.4 lb

## 2017-11-06 DIAGNOSIS — E782 Mixed hyperlipidemia: Secondary | ICD-10-CM | POA: Diagnosis not present

## 2017-11-06 DIAGNOSIS — I480 Paroxysmal atrial fibrillation: Secondary | ICD-10-CM

## 2017-11-06 MED ORDER — PROPRANOLOL HCL 10 MG PO TABS: 10.0000 mg | ORAL_TABLET | Freq: Four times a day (QID) | ORAL | 3 refills | Status: DC | PRN

## 2017-11-06 MED ORDER — APIXABAN 5 MG PO TABS: 5.0000 mg | ORAL_TABLET | Freq: Two times a day (BID) | ORAL | 11 refills | Status: DC

## 2017-11-06 NOTE — Telephone Encounter (Signed)
Follow up ° ° ° °Patient returning call.  Please call °

## 2017-11-06 NOTE — Telephone Encounter (Signed)
-----   Message from Emmaline Life, RN sent at 11/06/2017  4:29 PM EST ----- Please schedule Dr. Elmarie Shiley patient for a sleep study. His epworth score is 5, new onset a fib, and snoring.  Thank you! Sharyn Lull

## 2017-11-06 NOTE — Telephone Encounter (Signed)
Spoke with pt in regards to his heart monitor going into Afib, pt states that has an appointment this afternoon at 3.20 pm with [facility] .

## 2017-11-06 NOTE — Progress Notes (Signed)
Cardiology Office Note:    Date:  11/06/2017   ID:  Robert Robert [name], DOB June 08, 1951, MRN [id number]  PCP:  Dorena Cookey, MD  Cardiologist:  Mertie Moores, MD   Referring MD: Dorena Cookey, MD   Problem list 1.  Atrial fibrillation 2.  Hyperlipidemia 3.  Diabetes mellitus  Chief Complaint  Patient presents with  . Atrial Fibrillation     History of Present Illness:    Robert Robert [name] is a 67 y.o. male with a hx of palpitations for the past several months.  They started the Monday before Thanksgiving. In the middle of the night to go the bathroom.  Felt some pounding in his ears. Counted his heart rate in the 140s.  Also verified his Hr  with his blood pressure cuff. The arrhythmia had resolved by the next morning. He has not had any dizziness/syncope or presyncope.  He exercises regularly.  He swims at the gym 5 days a week.  He is swimming speech seems to be about the same as before he was diagnosed with atrial fibrillation.  He had another episode 2 days later.  He went to the emergency room down in [facility] but was back in normal rhythm at that time.   He returned and saw his primary medical doctor and an event monitor was placed.  We found atrial fibrillation with a slightly rapid ventricular response.  He was called into the office for further evaluation and management.  Episodes seem to be at night Dr. Sherren Mocha started Atenolol   Is retiured now,  [facility] .    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  Claudication-like symptoms.  [facility] and his arterial flow is found to be near normal in both legs. . He has not been diagnosed with sleep apnea but his wife states that he snores.   Past Medical History:  Diagnosis Date  . Diabetes mellitus type Robert [name]   . HLD (hyperlipidemia)   . HTN (hypertension)   . Hx of colonic polyps   . Skin cancer    history of    Past Surgical History:  Procedure  Laterality Date  . COLONOSCOPY    . PATELLAR TENDON REPAIR     Left  . Removal of excess tissue     Right and Left upper eyelids    Current Medications: Current Meds  Medication Sig  . atorvastatin (LIPITOR) 40 MG tablet TAKE 1 TABLET (40 MG TOTAL) BY MOUTH DAILY.  [name] Kitchen Coenzyme Q10 (COQ10 PO) Take by mouth.  [name] Kitchen glipiZIDE (GLUCOTROL) 10 MG tablet Take 1 tablet (10 mg total) by mouth 2 (two) times daily before a meal.  . glucose blood test strip 1 each by Other route daily. Use daily for glucose control - Dx 250.00  . hydrochlorothiazide (MICROZIDE) 12.5 MG capsule Take 1 capsule (12.5 mg total) by mouth daily.  . insulin aspart protamine- aspart (NOVOLOG MIX 70/30) (70-30) 100 UNIT/ML injection Inject 0.1 mLs (10 Units total) into the skin daily.  [name] Kitchen lisinopril (PRINIVIL,ZESTRIL) 40 MG tablet TAKE 1 AND 1/2 TABLET BY MOUTH EVERY DAY  . metFORMIN (GLUCOPHAGE) 1000 MG tablet Take 1 tablet (1,000 mg total) by mouth 2 (two) times daily with a meal.  . [DISCONTINUED] aspirin 81 MG EC tablet Take 81 mg by mouth daily.    . [DISCONTINUED] atenolol (TENORMIN) 25 MG tablet 1  tablet stat for rapid heart rate     Allergies:   Patient has no known allergies.   Social History   Socioeconomic History  . Marital status: Married    Spouse name: None  . Number of children: None  . Years of education: None  . Highest education level: None  Social Needs  . Financial resource strain: None  . Food insecurity - worry: None  . Food insecurity - inability: None  . Transportation needs - medical: None  . Transportation needs - non-medical: None  Occupational History  . Occupation: DVD's  . Occupation: Cabin crew: Self Employed  Tobacco Use  . Smoking status: Former Smoker    Types: Cigars    Last attempt to quit: 02/22/2011    Years since quitting: 6.7  . Smokeless tobacco: Never Used  . Tobacco comment: 2 cigars monthly or less  Substance and Sexual Activity  . Alcohol use: Yes      Alcohol/week: 0.0 oz    Comment: 6-8oz /wk  . Drug use: No  . Sexual activity: None  Other Topics Concern  . None  Social History Narrative  . None     Family History: The patient's family history includes Aortic aneurysm in his unknown relative; Cancer in his unknown relative. There is no history of Colon cancer.  ROS:   Please see the history of present illness.     All other systems reviewed and are negative.  EKGs/Labs/Other Studies Reviewed:    The following studies were reviewed today:   EKG:   Tele - atrial fib with controlled V response    Recent Labs: 03/01/2017: ALT 23; Hemoglobin 15.8; Platelets 207.0; TSH 0.94 10/04/2017: BUN 19; Creatinine, Ser 1.02; Potassium 4.6; Sodium 134  Recent Lipid Panel    Component Value Date/Time   CHOL 139 03/01/2017 1040   TRIG 192.0 (H) 03/01/2017 1040   HDL 38.70 (L) 03/01/2017 1040   CHOLHDL 4 03/01/2017 1040   VLDL 38.4 03/01/2017 1040   LDLCALC 62 03/01/2017 1040   LDLDIRECT 77.0 07/07/2015 0930    Physical Exam:    VS:  BP 134/76   Pulse (!) 52   Ht 6\' 3"  (1.905 m)   Wt 244 lb 6.4 oz (110.9 kg)   SpO2 99%   BMI 30.55 kg/m     Wt Readings from Last 3 Encounters:  11/06/17 244 lb 6.4 oz (110.9 kg)  10/09/17 244 lb (110.7 kg)  10/05/17 245 lb 12.8 oz (111.5 kg)     GEN:  Well nourished, well developed in no acute distress HEENT: Normal NECK: No JVD; No carotid bruits LYMPHATICS: No lymphadenopathy CARDIAC: RR, no murmurs, rubs, gallops RESPIRATORY:  Clear to auscultation without rales, wheezing or rhonchi  ABDOMEN: Soft, non-tender, non-distended MUSCULOSKELETAL:  No edema; No deformity  SKIN: Warm and dry NEUROLOGIC:  Alert and oriented x 3 PSYCHIATRIC:  Normal affect   ASSESSMENT:    1. PAF (paroxysmal atrial fibrillation) ([place])   2. Mixed hyperlipidemia    PLAN:    In order of problems listed above:  1. Paroxysmal atrial fibrillation:   CHADS2VASC  = 3  ( age 81, DM, HTN)  [name]  clinically is back in normal sinus rhythm.  He has paroxysmal atrial fibrillation.  He has not been taking any atenolol and will only take it as needed.  I think that as needed propranolol might work better than the atenolol.  We will make that substitution.  We will  also stop his aspirin and start him on Eliquis 5 mg twice a day.  We will get an echocardiogram.  His TSH was normal several months ago.  I discussed the relatively benign nature of the fact that he has paroxysmal atrial fibrillation.  The most important issue is the anticoagulation.  Medication Adjustments/Labs and Tests Ordered: Current medicines are reviewed at length with the patient today.  Concerns regarding medicines are outlined above.  Orders Placed This Encounter  Procedures  . ECHOCARDIOGRAM COMPLETE  . Split night study   Meds ordered this encounter  Medications  . propranolol (INDERAL) 10 MG tablet    Sig: Take 1 tablet (10 mg total) by mouth 4 (four) times daily as needed (palpitations, irregular or fast heart rate).    Dispense:  90 tablet    Refill:  3  . apixaban (ELIQUIS) 5 MG TABS tablet    Sig: Take 1 tablet (5 mg total) by mouth 2 (two) times daily.    Dispense:  60 tablet    Refill:  11    Signed, Mertie Moores, MD  11/06/2017 5:56 PM    [place]

## 2017-11-06 NOTE — Telephone Encounter (Signed)
LPTCB 1/14 md

## 2017-11-06 NOTE — Patient Instructions (Signed)
Medication Instructions:  Your physician has recommended you make the following change in your medication:  STOP Aspirin START Eliquis 5 mg twice daily STOP Atenolol START Propranolol 10 mg up to 4 times per day as needed for fast or irregular HR   Labwork: None Ordered   Testing/Procedures: Your physician has recommended that you have a sleep study. This test records several body functions during sleep, including: brain activity, eye movement, oxygen and carbon dioxide blood levels, heart rate and rhythm, breathing rate and rhythm, the flow of air through your mouth and nose, snoring, body muscle movements, and chest and belly movement. Gae Bon, CMA will call you to schedule  Your physician has requested that you have an echocardiogram. Echocardiography is a painless test that uses sound waves to create images of your heart. It provides your doctor with information about the size and shape of your heart and how well your heart's chambers and valves are working. This procedure takes approximately one hour. There are no restrictions for this procedure.   Follow-Up: Your physician recommends that you schedule a follow-up appointment in: 3 months with Dr. Acie Fredrickson   If you need a refill on your cardiac medications before your next appointment, please call your pharmacy.   Thank you for choosing [facility]! Christen Bame, RN [phone]

## 2017-11-06 NOTE — Telephone Encounter (Signed)
Sent to sleep pool. 

## 2017-11-06 NOTE — Telephone Encounter (Signed)
Spoke with patient informing him of his appointment time (3:20pm) with Dr. Acie Fredrickson

## 2017-11-10 NOTE — Telephone Encounter (Signed)
Higgins, Amy C  Motty Borin G, CMA        Patient can be scheduled. Please let me know and I will enter precert info.  Thanks,  Amy   

## 2017-11-16 ENCOUNTER — Ambulatory Visit (HOSPITAL_COMMUNITY): Payer: Medicare Other | Attending: Cardiology

## 2017-11-16 ENCOUNTER — Other Ambulatory Visit: Payer: Self-pay

## 2017-11-16 DIAGNOSIS — I480 Paroxysmal atrial fibrillation: Secondary | ICD-10-CM | POA: Diagnosis not present

## 2017-11-20 ENCOUNTER — Telehealth: Payer: Self-pay | Admitting: Cardiovascular Disease

## 2017-11-20 NOTE — Telephone Encounter (Signed)
New message     Patient calling with concerns about sleep study cost ($3200). Patient wants to know what other test can be done. Please call

## 2017-11-21 NOTE — Telephone Encounter (Signed)
Left detailed message on patient's voice mail that I will check with our sleep coordinator regarding whether a home sleep study would be more affordable. I advised that it will likely be next week before we call him back as our sleep coordinator is out of the office. I advised him to call back with questions or concerns.

## 2017-11-27 ENCOUNTER — Other Ambulatory Visit: Payer: Self-pay | Admitting: Family Medicine

## 2017-11-27 NOTE — Telephone Encounter (Signed)
Reached out to the patient about the cost for a home sleep study.  Informed the patient a Home sleep study cost around $427. Patient stated that sounded affordable and ask for the code so he could call his insurance. Patient was informed the pap assistant will call on Tuesday 11/28/16 and get that code for him.

## 2017-11-28 ENCOUNTER — Encounter (HOSPITAL_BASED_OUTPATIENT_CLINIC_OR_DEPARTMENT_OTHER): Payer: Medicare Other

## 2017-11-28 NOTE — Telephone Encounter (Signed)
Reached out to the patient to give him the code for the home sleep test. [phone] is the code for the home sleep test. Patient thanked me for calling.

## 2017-12-01 ENCOUNTER — Encounter: Payer: Self-pay | Admitting: Internal Medicine

## 2017-12-01 ENCOUNTER — Ambulatory Visit (INDEPENDENT_AMBULATORY_CARE_PROVIDER_SITE_OTHER): Payer: Medicare Other | Admitting: Internal Medicine

## 2017-12-01 VITALS — BP 132/82 | HR 85 | Ht 74.5 in | Wt 242.0 lb

## 2017-12-01 DIAGNOSIS — E1142 Type 2 diabetes mellitus with diabetic polyneuropathy: Secondary | ICD-10-CM | POA: Diagnosis not present

## 2017-12-01 DIAGNOSIS — E1165 Type 2 diabetes mellitus with hyperglycemia: Secondary | ICD-10-CM

## 2017-12-01 MED ORDER — DULAGLUTIDE 0.75 MG/0.5ML ~~LOC~~ SOAJ: SUBCUTANEOUS | 1 refills | Status: DC

## 2017-12-01 NOTE — Patient Instructions (Addendum)
Please continue: - Metformin 1000 mg 2x a day, with meal  - Glipizide 10 mg 2x a day before meals  Please stop Novolog 70/30 (if we need to continue with this, take it 10-15 min before you eat).  Please start Trulicity 1.30 mg weekly. Let me know when you are close to running out to call in the higher dose to your pharmacy (1.5 mg).  Please return in 1.5 months with your sugar log.   PATIENT INSTRUCTIONS FOR TYPE 2 DIABETES:  **Please join MyChart!** - see attached instructions about how to join if you have not done so already.  DIET AND EXERCISE Diet and exercise is an important part of diabetic treatment.  We recommended aerobic exercise in the form of brisk walking (working between 40-60% of maximal aerobic capacity, similar to brisk walking) for 150 minutes per week (such as 30 minutes five days per week) along with 3 times per week performing 'resistance' training (using various gauge rubber tubes with handles) 5-10 exercises involving the major muscle groups (upper body, lower body and core) performing 10-15 repetitions (or near fatigue) each exercise. Start at half the above goal but build slowly to reach the above goals. If limited by weight, joint pain, or disability, we recommend daily walking in a swimming pool with water up to waist to reduce pressure from joints while allow for adequate exercise.    BLOOD GLUCOSES Monitoring your blood glucoses is important for continued management of your diabetes. Please check your blood glucoses 2-4 times a day: fasting, before meals and at bedtime (you can rotate these measurements - e.g. one day check before the 3 meals, the next day check before 2 of the meals and before bedtime, etc.).   HYPOGLYCEMIA (low blood sugar) Hypoglycemia is usually a reaction to not eating, exercising, or taking too much insulin/ other diabetes drugs.  Symptoms include tremors, sweating, hunger, confusion, headache, etc. Treat IMMEDIATELY with 15 grams of  Carbs: . 4 glucose tablets .  cup regular juice/soda . 2 tablespoons raisins . 4 teaspoons sugar . 1 tablespoon honey Recheck blood glucose in 15 mins and repeat above if still symptomatic/blood glucose <100.  RECOMMENDATIONS TO REDUCE YOUR RISK OF DIABETIC COMPLICATIONS: * Take your prescribed MEDICATION(S) * Follow a DIABETIC diet: Complex carbs, fiber rich foods, (monounsaturated and polyunsaturated) fats * AVOID saturated/trans fats, high fat foods, >2,300 mg salt per day. * EXERCISE at least 5 times a week for 30 minutes or preferably daily.  * DO NOT SMOKE OR DRINK more than 1 drink a day. * Check your FEET every day. Do not wear tightfitting shoes. Contact us if you develop an ulcer * See your EYE doctor once a year or more if needed * Get a FLU shot once a year * Get a PNEUMONIA vaccine once before and once after age 61 years  GOALS:  * Your Hemoglobin A1c of <7%  * fasting sugars need to be <130 * after meals sugars need to be <180 (2h after you start eating) * Your Systolic BP should be 865 or lower  * Your Diastolic BP should be 80 or lower  * Your HDL (Good Cholesterol) should be 40 or higher  * Your LDL (Bad Cholesterol) should be 100 or lower. * Your Triglycerides should be 150 or lower  * Your Urine microalbumin (kidney function) should be <30 * Your Body Mass Index should be 25 or lower    Please consider the following ways to cut down carbs and  fat and increase fiber and micronutrients in your diet: - substitute whole grain for white bread or pasta - substitute brown rice for white rice - substitute 90-calorie flat bread pieces for slices of bread when possible - substitute sweet potatoes or yams for white potatoes - substitute humus for margarine - substitute tofu for cheese when possible - substitute almond or rice milk for regular milk (would not drink soy milk daily due to concern for soy estrogen influence on breast cancer risk) - substitute dark  chocolate for other sweets when possible - substitute water - can add lemon or orange slices for taste - for diet sodas (artificial sweeteners will trick your body that you can eat sweets without getting calories and will lead you to overeating and weight gain in the long run) - do not skip breakfast or other meals (this will slow down the metabolism and will result in more weight gain over time)  - can try smoothies made from fruit and almond/rice milk in am instead of regular breakfast - can also try old-fashioned (not instant) oatmeal made with almond/rice milk in am - order the dressing on the side when eating salad at a restaurant (pour less than half of the dressing on the salad) - eat as little meat as possible - can try juicing, but should not forget that juicing will get rid of the fiber, so would alternate with eating raw veg./fruits or drinking smoothies - use as little oil as possible, even when using olive oil - can dress a salad with a mix of balsamic vinegar and lemon juice, for e.g. - use agave nectar, stevia sugar, or regular sugar rather than artificial sweateners - steam or broil/roast veggies  - snack on veggies/fruit/nuts (unsalted, preferably) when possible, rather than processed foods - reduce or eliminate aspartame in diet (it is in diet sodas, chewing gum, etc) Read the labels!  Try to read Dr. Janene Harvey book: "Program for Reversing Diabetes" for other ideas for healthy eating.

## 2017-12-01 NOTE — Progress Notes (Signed)
Patient ID: Robert [name], male   DOB: 07/11/1951, 67 y.o.   MRN: [id number]   HPI: Robert [name] is a 67 y.o.-year-old male, referred by his PCP, Dr. Sherren Mocha, for management of DM2, dx in 2000s, insulin-dependent since 2018, uncontrolled, with complications (PN, ED).  Last hemoglobin A1c was: Lab Results  Component Value Date   HGBA1C 7.6 (H) 10/04/2017   HGBA1C 7.7 (H) 07/05/2017   HGBA1C 7.3 (H) 03/01/2017   Pt is on a regimen of: - Metformin 1000 mg 2x a day, with meals - Glipizide 10 mg 2x a day before meals - Novolog 70/30 10 units 2x a day, either before or after meals  Pt checks his sugars 3-4 a day and they are: - am: 120-176 - 2h after b'fast: n/c - before lunch: 150-160 - 2h after lunch: n/c - before dinner: 110-140 - 2h after dinner: n/c - bedtime: 74-110 - nighttime: n/c Lowest sugar was 28 (at waking up) right after starting insulin; he has hypoglycemia awareness at 70s.  Highest sugar was 280 (Txgiving).  Glucometer: ReliOn  Pt's meals are: - Breakfast: whole wheat toast + butter, 1x a weeK: eggs, cheese - Lunch: tuna salad or tuna sandwich ([employer] delivery), soft taco, meat + baked beans or potatoes - Dinner: [employer] delivery - Snacks: 1-0: nuts, bananas, other fruit  He swims 5x a week.  - no CKD, last BUN/creatinine:  Lab Results  Component Value Date   BUN 19 10/04/2017   BUN 19 07/05/2017   CREATININE 1.02 10/04/2017   CREATININE 0.99 07/05/2017  On lisinopril 20. -+ HL; last set of lipids: Lab Results  Component Value Date   CHOL 139 03/01/2017   HDL 38.70 (L) 03/01/2017   LDLCALC 62 03/01/2017   LDLDIRECT 77.0 07/07/2015   TRIG 192.0 (H) 03/01/2017   CHOLHDL 4 03/01/2017  On Lipitor 40 qod.  Also, on CoQ10. - last eye exam was in 2017. No DR. + small cataracts. - + numbness and tingling in his feet.  Pt has no FH of DM.  He has a h/o arrhythmia (A fib) - wore an event monitor >> now on beta blocker. Cardiologist:  Dr. Acie Fredrickson.  ROS: Constitutional: no weight gain/loss, no fatigue, no subjective hyperthermia/hypothermia Eyes: no blurry vision, no xerophthalmia ENT: no sore throat, no nodules palpated in throat, no dysphagia/odynophagia, no hoarseness, + tinnitus Cardiovascular: no CP/SOB/+ palpitations/no leg swelling Respiratory: no cough/SOB Gastrointestinal: no N/V/+ D/no C Musculoskeletal: no muscle/joint aches Skin: no rashes Neurological: no tremors/numbness/tingling/dizziness Psychiatric: no depression/anxiety + difficulty with erections  Past Medical History:  Diagnosis Date  . Diabetes mellitus type [name]   . HLD (hyperlipidemia)   . HTN (hypertension)   . Hx of colonic polyps   . Skin cancer    history of   Past Surgical History:  Procedure Laterality Date  . COLONOSCOPY    . PATELLAR TENDON REPAIR     Left  . Removal of excess tissue     Right and Left upper eyelids   Social History   Socioeconomic History  . Marital status: Married    Spouse name: Not on file  . Number of children: 2  Social Needs  Occupational History  . Occupation: DVD's  . Occupation: Cabin crew: Self Employed, retired  Tobacco Use  . Smoking status: Former Smoker    Types: Cigars    Last attempt to quit: 02/22/2011  . Smokeless tobacco: Never Used  .  Tobacco comment: 2 cigars monthly or less  Substance and Sexual Activity  . Alcohol use: Yes     0.0 oz    Vodka, 2-3 drinks 3x a week   Drugs: No   Current Outpatient Medications on File Prior to Visit  Medication Sig Dispense Refill  . apixaban (ELIQUIS) 5 MG TABS tablet Take 1 tablet (5 mg total) by mouth 2 (two) times daily. 60 tablet 11  . atorvastatin (LIPITOR) 40 MG tablet TAKE 1 TABLET (40 MG TOTAL) BY MOUTH DAILY. 90 tablet 2  . Coenzyme Q10 (COQ10 PO) Take by mouth.    [name] Kitchen glipiZIDE (GLUCOTROL) 10 MG tablet Take 1 tablet (10 mg total) by mouth 2 (two) times daily before a meal. 200 tablet 4  . glucose blood test strip  1 each by Other route daily. Use daily for glucose control - Dx 250.00 100 each 0  . hydrochlorothiazide (MICROZIDE) 12.5 MG capsule Take 1 capsule (12.5 mg total) by mouth daily. 90 capsule 4  . insulin aspart protamine- aspart (NOVOLOG MIX 70/30) (70-30) 100 UNIT/ML injection Inject 0.1 mLs (10 Units total) into the skin daily. (Patient taking differently: Inject 20 Units into the skin daily. ) 10 mL 11  . lisinopril (PRINIVIL,ZESTRIL) 40 MG tablet TAKE 1 AND 1/2 TABLET BY MOUTH EVERY DAY 150 tablet 3  . metFORMIN (GLUCOPHAGE) 1000 MG tablet Take 1 tablet (1,000 mg total) by mouth 2 (two) times daily with a meal. 180 tablet 2  . propranolol (INDERAL) 10 MG tablet Take 1 tablet (10 mg total) by mouth 4 (four) times daily as needed (palpitations, irregular or fast heart rate). 90 tablet 3   No current facility-administered medications on file prior to visit.    No Known Allergies Family History  Problem Relation Age of Onset  . Aortic aneurysm Unknown        family history  . Cancer Unknown        Breast (1st degree relative)  . Colon cancer Neg Hx     PE: BP 132/82   Pulse 85   Ht 6' 2.5" (1.892 m)   Wt 242 lb (109.8 kg)   SpO2 96%   BMI 30.66 kg/m  Wt Readings from Last 3 Encounters:  12/01/17 242 lb (109.8 kg)  11/06/17 244 lb 6.4 oz (110.9 kg)  10/09/17 244 lb (110.7 kg)   Constitutional: Obese, in NAD Eyes: PERRLA, EOMI, no exophthalmos ENT: moist mucous membranes, no thyromegaly, no cervical lymphadenopathy Cardiovascular: RRR, No MRG Respiratory: CTA B Gastrointestinal: abdomen soft, NT, ND, BS+ Musculoskeletal: no deformities, strength intact in all 4 Skin: moist, warm, no rashes Neurological: no tremor with outstretched hands, DTR normal in all 4  ASSESSMENT: 1. DM2, insulin-dependent, uncontrolled, with complications - PN - ED  PLAN:  1. Patient with long-standing, uncontrolled diabetes, on metformin, glipizide, and premixed insulin regimen, which became  insufficient.He is taking the premixed insulin either before or after meals and this creates pbs especially at night >> mild lows. He then eats a sweet to compensate for the low >> high am sugars.  - at this visit, we discussed about switching from insulin to a GLP1 R agonist >> discussed about the advantages of using this and also possible SEs. Will start with a low dose. However, if we need to add a little insulin until he can increase the Trulicity dose to target dose of 1.5 mg weekly, I advised him to take it 15 min before meals. - for now, will continue Glipizide  and Metformin. At next visit, if needed, we may try an SGLT2 inh instead of Glipizide. - I suggested to:  Patient Instructions  Please continue: - Metformin 1000 mg 2x a day, with meal  - Glipizide 10 mg 2x a day before meals  Please stop Novolog 70/30 (if we need to continue with this, take it 10-15 min before you eat).  Please start Trulicity 1.76 mg weekly. Let me know when you are close to running out to call in the higher dose to your pharmacy (1.5 mg).  Please return in 1.5 months with your sugar log.   - Strongly advised him to start checking sugars at different times of the day - check 2 times a day, rotating checks - given sugar log and advised how to fill it and to bring it at next appt  - given foot care handout and explained the principles  - given instructions for hypoglycemia management "15-15 rule"  - advised for yearly eye exams >> he is due - Return to clinic in 1.5 mo with sugar log   [name], MD PhD [facility] Endocrinology

## 2017-12-03 ENCOUNTER — Encounter: Payer: Self-pay | Admitting: Family Medicine

## 2017-12-05 ENCOUNTER — Encounter: Payer: Self-pay | Admitting: Internal Medicine

## 2017-12-24 ENCOUNTER — Encounter: Payer: Self-pay | Admitting: Internal Medicine

## 2017-12-26 ENCOUNTER — Other Ambulatory Visit: Payer: Self-pay | Admitting: Internal Medicine

## 2017-12-26 MED ORDER — DULAGLUTIDE 0.75 MG/0.5ML ~~LOC~~ SOAJ: SUBCUTANEOUS | 5 refills | Status: DC

## 2018-01-06 ENCOUNTER — Other Ambulatory Visit: Payer: Self-pay | Admitting: Family Medicine

## 2018-01-25 NOTE — Telephone Encounter (Signed)
Patient was scheduled for spit night on 11/28/17 and cancelled. Patient has not indicated that he wants the study rescheduled.

## 2018-01-29 ENCOUNTER — Encounter: Payer: Self-pay | Admitting: Family Medicine

## 2018-01-29 ENCOUNTER — Encounter: Payer: Self-pay | Admitting: Internal Medicine

## 2018-01-29 ENCOUNTER — Ambulatory Visit (INDEPENDENT_AMBULATORY_CARE_PROVIDER_SITE_OTHER): Payer: Medicare Other | Admitting: Internal Medicine

## 2018-01-29 VITALS — BP 136/84 | HR 90 | Ht 74.5 in | Wt 237.4 lb

## 2018-01-29 DIAGNOSIS — E1142 Type 2 diabetes mellitus with diabetic polyneuropathy: Secondary | ICD-10-CM

## 2018-01-29 DIAGNOSIS — E785 Hyperlipidemia, unspecified: Secondary | ICD-10-CM

## 2018-01-29 DIAGNOSIS — E1165 Type 2 diabetes mellitus with hyperglycemia: Secondary | ICD-10-CM

## 2018-01-29 LAB — POCT GLYCOSYLATED HEMOGLOBIN (HGB A1C): Hemoglobin A1C: 6.6

## 2018-01-29 MED ORDER — GLIPIZIDE 10 MG PO TABS: 5.0000 mg | ORAL_TABLET | Freq: Two times a day (BID) | ORAL | 3 refills | Status: DC

## 2018-01-29 NOTE — Patient Instructions (Addendum)
Please continue: - Metformin 1000 mg 2x a day with meals  Decrease: - Glipizide to 5 mg 2x a day before meals and then stop if sugars allow  Please increase: - Trulicity to 1.5 mg weekly  Please return in 6 months with your sugar log.

## 2018-01-29 NOTE — Progress Notes (Signed)
Patient ID: Robert [name], male   DOB: [birth year], 67 y.o.   MRN: [id number]   HPI: Robert [name] is a 67 y.o.-year-old male, returning for follow-up for  DM2, dx in 2000s, insulin-dependent since 2018, uncontrolled, with complications (PN, ED).  Last visit 2 months ago.  His son has melanoma. He will fly to [place] to be with him.   Last hemoglobin A1c was: Lab Results  Component Value Date   HGBA1C 7.6 (H) 10/04/2017   HGBA1C 7.7 (H) 07/05/2017   HGBA1C 7.3 (H) 03/01/2017   Pt is on a regimen of: - Metformin 1000 mg 2x a day, with meals - Glipizide 10 mg 2x a day before meals  - Trulicity 3.01 mg weekly -started 11/2017 We stopped Novolog 70/30 10 units 2x a day in 11/2017, when starting Trulicity.  Pt checks his sugars 3-4 times a day: - am: 120-176 >> 121-146, 170 - 2h after b'fast: n/c >> 77, 99-157, 181, 234 - before lunch: 150-160 >> 97-130 - 2h after lunch: n/c >> 90-149 - before dinner: 110-140 >> 109-141 - 2h after dinner: n/c >> 125-188 - bedtime: 74-110 >> 93-107 - nighttime: n/c >> 73 Lowest sugar was 28 right after starting insulin >> 77; he has hypoglycemia awareness in the 70s. Highest sugar was 280 (Txgiving) >> 234.  Glucometer: ReliOn  Pt's meals are: - Breakfast: whole wheat toast + butter, 1x a weeK: eggs, cheese - Lunch: tuna salad or tuna sandwich ([employer] delivery), soft taco, meat + baked beans or potatoes - Dinner: [employer] delivery - Snacks: 1-0: nuts, bananas, other fruit  He swims 5 times a week.  - No CKD, last BUN/creatinine:  Lab Results  Component Value Date   BUN 19 10/04/2017   BUN 19 07/05/2017   CREATININE 1.02 10/04/2017   CREATININE 0.99 07/05/2017  On lisinopril 20. -+ HL; last set of lipids: Lab Results  Component Value Date   CHOL 139 03/01/2017   HDL 38.70 (L) 03/01/2017   LDLCALC 62 03/01/2017   LDLDIRECT 77.0 07/07/2015   TRIG 192.0 (H) 03/01/2017   CHOLHDL 4 03/01/2017  On Lipitor 40 mg  every other day.  Also on co-Q10. - last eye exam was in 2017: No DR, + Small cataract - + numbness and tingling in his feet.  He has a h/o arrhythmia (A fib) - wore an event monitor >> now on beta blocker. Cardiologist: Dr. Acie Fredrickson.  ROS: Constitutional: no weight gain/no weight loss, no fatigue, no subjective hyperthermia, no subjective hypothermia Eyes: no blurry vision, no xerophthalmia ENT: no sore throat, no nodules palpated in throat, no dysphagia, no odynophagia, no hoarseness Cardiovascular: no CP/no SOB/no palpitations/no leg swelling Respiratory: no cough/no SOB/no wheezing Gastrointestinal: no N/no V/+ D/no C/no acid reflux Musculoskeletal: no muscle aches/no joint aches Skin: no rashes, no hair loss Neurological: no tremors/+ numbness/+ tingling/no dizziness  I reviewed pt's medications, allergies, PMH, social hx, family hx, and changes were documented in the history of present illness. Otherwise, unchanged from my initial visit note.  Past Medical History:  Diagnosis Date  . Diabetes mellitus type [name]   . HLD (hyperlipidemia)   . HTN (hypertension)   . Hx of colonic polyps   . Skin cancer    history of   Past Surgical History:  Procedure Laterality Date  . COLONOSCOPY    . PATELLAR TENDON REPAIR     Left  . Removal of excess tissue     Right and Left  upper eyelids   Social History   Socioeconomic History  . Marital status: Married    Spouse name: Not on file  . Number of children: 2  Social Needs  Occupational History  . Occupation: DVD's  . Occupation: Cabin crew: Self Employed, retired  Tobacco Use  . Smoking status: Former Smoker    Types: Cigars    Last attempt to quit: 02/22/2011  . Smokeless tobacco: Never Used  . Tobacco comment: 2 cigars monthly or less  Substance and Sexual Activity  . Alcohol use: Yes     0.0 oz    Vodka, 2-3 drinks 3x a week   Drugs: No   Current Outpatient Medications on File Prior to Visit   Medication Sig Dispense Refill  . apixaban (ELIQUIS) 5 MG TABS tablet Take 1 tablet (5 mg total) by mouth 2 (two) times daily. 60 tablet 11  . atorvastatin (LIPITOR) 40 MG tablet TAKE 1 TABLET (40 MG TOTAL) BY MOUTH DAILY. 90 tablet 2  . Coenzyme Q10 (COQ10 PO) Take by mouth.    . Dulaglutide (TRULICITY) 7.82 UM/3.5TI SOPN Inject 0.75 mg weekly under skin 4 pen 5  . glipiZIDE (GLUCOTROL) 10 MG tablet Take 1 tablet (10 mg total) by mouth 2 (two) times daily before a meal. 200 tablet 4  . glucose blood test strip 1 each by Other route daily. Use daily for glucose control - Dx 250.00 100 each 0  . hydrochlorothiazide (MICROZIDE) 12.5 MG capsule Take 1 capsule (12.5 mg total) by mouth daily. 90 capsule 4  . lisinopril (PRINIVIL,ZESTRIL) 40 MG tablet TAKE 1 AND 1/2 TABLET BY MOUTH EVERY DAY 150 tablet 3  . metFORMIN (GLUCOPHAGE) 1000 MG tablet TAKE 1 TABLET (1,000 MG TOTAL) BY MOUTH 2 (TWO) TIMES DAILY WITH A MEAL. 180 tablet 2  . propranolol (INDERAL) 10 MG tablet Take 1 tablet (10 mg total) by mouth 4 (four) times daily as needed (palpitations, irregular or fast heart rate). 90 tablet 3   No current facility-administered medications on file prior to visit.    No Known Allergies Family History  Problem Relation Age of Onset  . Aortic aneurysm Unknown        family history  . Cancer Unknown        Breast (1st degree relative)  . Colon cancer Neg Hx     PE: BP 136/84   Pulse 90   Ht 6' 2.5" (1.892 m)   Wt 237 lb 6.4 oz (107.7 kg)   SpO2 96%   BMI 30.07 kg/m  Wt Readings from Last 3 Encounters:  01/29/18 237 lb 6.4 oz (107.7 kg)  12/01/17 242 lb (109.8 kg)  11/06/17 244 lb 6.4 oz (110.9 kg)   Constitutional: overweight, in NAD Eyes: PERRLA, EOMI, no exophthalmos ENT: moist mucous membranes, no thyromegaly, no cervical lymphadenopathy Cardiovascular: RRR, No MRG Respiratory: CTA B Gastrointestinal: abdomen soft, NT, ND, BS+ Musculoskeletal: no deformities, strength intact in all  4 Skin: moist, warm, no rashes Neurological: no tremor with outstretched hands, DTR normal in all 4  ASSESSMENT: 1. DM2, insulin-dependent, uncontrolled, with complications - PN - ED  2. HL  PLAN:  1. Patient with long-standing, uncontrolled diabetes, on metformin, glipizide, and previously premixed insulin, but changed at last visit to a GLP-1 receptor agonist.  At last visit, he was taking his premixed insulin either before or after meals and was dropping his sugars at night.  He then had to eat something sweet to raise  his sugars with a consequence of higher a.m. sugars.  He is doing much better after he started a GLP-1 receptor agonist and he was able to stop his premixed insulin completely.  He is still on the low-dose Trulicity.  We continue glipizide and metformin at last visit. - at this visit, sugars are improved, but still has higher sugars in am and has occasional higher or lower sugars throughout the day >> will decrease Glipizide and increase Trulicity - I suggested to:  Patient Instructions  Please continue: - Metformin 1000 mg 2x a day with meals  Decrease: - Glipizide to 5 mg 2x a day before meals and then stop if sugars allow  Please increase: - Trulicity to 1.5 mg weekly  Please return in 3 months with your sugar log.   - today, HbA1c is 6.6% (lower!) - continue checking sugars at different times of the day - check 1x a day, rotating checks - advised for yearly eye exams >> he is not UTD - will need a foot exam then - Return to clinic in 3 mo with sugar log   2.  HL - Reviewed latest lipid panel from 02/2017:  LDL at goal and improved since last check, Triglycerides high, HDL low - continues on Lipitor without side effects.  Also on CoQ10.  Robert Kingdom, MD PhD [facility] Endocrinology

## 2018-02-06 ENCOUNTER — Ambulatory Visit: Payer: Medicare Other | Admitting: Cardiovascular Disease

## 2018-02-06 ENCOUNTER — Other Ambulatory Visit: Payer: Self-pay | Admitting: Family Medicine

## 2018-02-06 DIAGNOSIS — E785 Hyperlipidemia, unspecified: Secondary | ICD-10-CM

## 2018-02-21 ENCOUNTER — Encounter: Payer: Self-pay | Admitting: Internal Medicine

## 2018-02-22 ENCOUNTER — Other Ambulatory Visit: Payer: Self-pay

## 2018-02-22 MED ORDER — DULAGLUTIDE 0.75 MG/0.5ML ~~LOC~~ SOAJ: SUBCUTANEOUS | 5 refills | Status: DC

## 2018-02-22 MED ORDER — GLIPIZIDE 10 MG PO TABS: 5.0000 mg | ORAL_TABLET | Freq: Two times a day (BID) | ORAL | 3 refills | Status: DC

## 2018-03-22 ENCOUNTER — Encounter: Payer: Self-pay | Admitting: Internal Medicine

## 2018-03-22 MED ORDER — DULAGLUTIDE 0.75 MG/0.5ML ~~LOC~~ SOAJ: SUBCUTANEOUS | 5 refills | Status: DC

## 2018-04-04 ENCOUNTER — Other Ambulatory Visit: Payer: Self-pay | Admitting: Family Medicine

## 2018-04-17 DIAGNOSIS — H5203 Hypermetropia, bilateral: Secondary | ICD-10-CM | POA: Diagnosis not present

## 2018-04-17 DIAGNOSIS — H2513 Age-related nuclear cataract, bilateral: Secondary | ICD-10-CM | POA: Diagnosis not present

## 2018-04-17 DIAGNOSIS — E119 Type 2 diabetes mellitus without complications: Secondary | ICD-10-CM | POA: Diagnosis not present

## 2018-04-17 DIAGNOSIS — T1502XA Foreign body in cornea, left eye, initial encounter: Secondary | ICD-10-CM | POA: Diagnosis not present

## 2018-04-17 LAB — HM DIABETES EYE EXAM

## 2018-04-19 ENCOUNTER — Other Ambulatory Visit: Payer: Self-pay | Admitting: Family Medicine

## 2018-04-19 ENCOUNTER — Ambulatory Visit (INDEPENDENT_AMBULATORY_CARE_PROVIDER_SITE_OTHER): Payer: Medicare Other | Admitting: Cardiovascular Disease

## 2018-04-19 ENCOUNTER — Encounter: Payer: Self-pay | Admitting: Cardiovascular Disease

## 2018-04-19 VITALS — BP 126/64 | HR 75 | Ht 75.0 in | Wt 229.1 lb

## 2018-04-19 DIAGNOSIS — I10 Essential (primary) hypertension: Secondary | ICD-10-CM

## 2018-04-19 DIAGNOSIS — I480 Paroxysmal atrial fibrillation: Secondary | ICD-10-CM | POA: Diagnosis not present

## 2018-04-19 NOTE — Patient Instructions (Signed)
Medication Instructions: Your physician recommends that you continue on your current medications as directed. Please refer to the Current Medication list given to you today.  Labwork: None Ordered  Procedures/Testing: None Ordered  Follow-Up: Your physician wants you to follow-up in: 6 MONTHS with Scott Weaver, PA-C. You will receive a reminder letter in the mail two months in advance. If you don't receive a letter, please call our office to schedule the follow-up appointment.   If you need a refill on your cardiac medications before your next appointment, please call your pharmacy.    

## 2018-04-19 NOTE — Progress Notes (Signed)
Cardiology Office Note:    Date:  04/19/2018   ID:  Robert [name], Robert [name] 07-29-1951, MRN [id number]  PCP:  Dorena Cookey, MD  Cardiologist:  Mertie Moores, MD   Referring MD: Dorena Cookey, MD   Problem list 1.  Atrial fibrillation 2.  Hyperlipidemia 3.  Diabetes mellitus  Chief Complaint  Patient presents with  . Atrial Fibrillation     11/06/17    Robert [name] is a 67 y.o. male with a hx of palpitations for the past several months.  They started the Monday before Thanksgiving. In the middle of the night to go the bathroom.  Felt some pounding in his ears. Counted his heart rate in the 140s.  Also verified his Hr  with his blood pressure cuff. The arrhythmia had resolved by the next morning. He has not had any dizziness/syncope or presyncope.  He exercises regularly.  He swims at the gym 5 days a week.  He is swimming speech seems to be about the same as before he was diagnosed with atrial fibrillation.  He had another episode 2 days later.  He went to the emergency room down in [facility] but was back in normal rhythm at that time.   He returned and saw his primary medical doctor and an event monitor was placed.  We found atrial fibrillation with a slightly rapid ventricular response.  He was called into the office for further evaluation and management.  Episodes seem to be at night Dr. Sherren Mocha started Atenolol   Is retiured now,  [facility] .    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  Claudication-like symptoms.  Has seen Dr. Oneida Alar  [facility] and his arterial flow is found to be near normal in both legs. . He has not been diagnosed with sleep apnea but his wife states that he snores.  April 19, 2018:   Robert [name]   is seen back today for follow-up of his paroxysmal atrial for ablation, hyperlipidemia, hypertension, diabetes mellitus.  He is basically asymptomatic.  He cannot tell any difference between when he is in atrial  fibrillation or in sinus rhythm.  He will occasionally feel his pulse and feel that it is irregular.  He typically will take a propranolol tablet with conversion back to sinus rhythm in less than an hour. Still exercising at the [facility] 5 days a week. Checked out sleep studies,   Was going to cost $4,000.  Diabetes is better controlled on Trulicity.    Past Medical History:  Diagnosis Date  . Diabetes mellitus type [name]   . HLD (hyperlipidemia)   . HTN (hypertension)   . Hx of colonic polyps   . Skin cancer    history of    Past Surgical History:  Procedure Laterality Date  . COLONOSCOPY    . PATELLAR TENDON REPAIR     Left  . Removal of excess tissue     Right and Left upper eyelids    Current Medications: Current Meds  Medication Sig  . apixaban (ELIQUIS) 5 MG TABS tablet Take 1 tablet (5 mg total) by mouth 2 (two) times daily.  [name] Kitchen atorvastatin (LIPITOR) 40 MG tablet TAKE 1 TABLET (40 MG TOTAL) BY MOUTH DAILY.  [name] Kitchen Coenzyme Q10 (COQ10 PO) Take 1 capsule by mouth daily.   . Dulaglutide (TRULICITY) 7.68 TL/5.7WI SOPN Inject 1.5 mg weekly under skin  . glipiZIDE (  GLUCOTROL) 10 MG tablet TAKE 1 TABLET BY MOUTH TWICE A DAY BEFORE A MEAL  . glucose blood test strip 1 each by Other route daily. Use daily for glucose control - Dx 250.00  . hydrochlorothiazide (MICROZIDE) 12.5 MG capsule TAKE 1 CAPSULE BY MOUTH EVERY DAY  . lisinopril (PRINIVIL,ZESTRIL) 40 MG tablet TAKE 1 AND 1/2 TABLET BY MOUTH EVERY DAY  . metFORMIN (GLUCOPHAGE) 1000 MG tablet TAKE 1 TABLET (1,000 MG TOTAL) BY MOUTH 2 (TWO) TIMES DAILY WITH A MEAL.  [name] Kitchen propranolol (INDERAL) 10 MG tablet Take 1 tablet (10 mg total) by mouth 4 (four) times daily as needed (palpitations, irregular or fast heart rate).     Allergies:   Patient has no known allergies.   Social History   Socioeconomic History  . Marital status: Married    Spouse name: Not on file  . Number of children: Not on file  . Years of education: Not on file  .  Highest education level: Not on file  Occupational History  . Occupation: DVD's  . Occupation: Cabin crew: Self Employed  Social Needs  . Financial resource strain: Not on file  . Food insecurity:    Worry: Not on file    Inability: Not on file  . Transportation needs:    Medical: Not on file    Non-medical: Not on file  Tobacco Use  . Smoking status: Former Smoker    Types: Cigars    Last attempt to quit: 02/22/2011    Years since quitting: 7.1  . Smokeless tobacco: Never Used  . Tobacco comment: 2 cigars monthly or less  Substance and Sexual Activity  . Alcohol use: Yes    Alcohol/week: 0.0 oz    Comment: 6-8oz /wk  . Drug use: No  . Sexual activity: Not on file  Lifestyle  . Physical activity:    Days per week: Not on file    Minutes per session: Not on file  . Stress: Not on file  Relationships  . Social connections:    Talks on phone: Not on file    Gets together: Not on file    Attends religious service: Not on file    Active member of club or organization: Not on file    Attends meetings of clubs or organizations: Not on file    Relationship status: Not on file  Other Topics Concern  . Not on file  Social History Narrative  . Not on file     Family History: The patient's family history includes Aortic aneurysm in his unknown relative; Cancer in his unknown relative. There is no history of Colon cancer.  ROS:   Please see the history of present illness.     All other systems reviewed and are negative.  EKGs/Labs/Other Studies Reviewed:    The following studies were reviewed today:   EKG:   Tele - atrial fib with controlled V response    Recent Labs: 10/04/2017: BUN 19; Creatinine, Ser 1.02; Potassium 4.6; Sodium 134  Recent Lipid Panel    Component Value Date/Time   CHOL 139 03/01/2017 1040   TRIG 192.0 (H) 03/01/2017 1040   HDL 38.70 (L) 03/01/2017 1040   CHOLHDL 4 03/01/2017 1040   VLDL 38.4 03/01/2017 1040   LDLCALC 62  03/01/2017 1040   LDLDIRECT 77.0 07/07/2015 0930    Physical Exam:     Physical Exam: Blood pressure 126/64, pulse 75, height 6\' 3"  (1.905 m), weight 229 lb 1.9  oz (103.9 kg), SpO2 99 %.  GEN:  Well nourished, well developed in no acute distress HEENT: Normal NECK: No JVD; No carotid bruits LYMPHATICS: No lymphadenopathy CARDIAC: RRR  RESPIRATORY:  Clear to auscultation without rales, wheezing or rhonchi  ABDOMEN: Soft, non-tender, non-distended MUSCULOSKELETAL:  No edema; No deformity  SKIN: Warm and dry NEUROLOGIC:  Alert and oriented x 3   ASSESSMENT:    1. PAF (paroxysmal atrial fibrillation) ([place])   2. Essential hypertension    PLAN:    In order of problems listed above:  1. Paroxysmal atrial fibrillation:   CHADS2VASC  = 3  ( age 62, DM, HTN)   doing well.   Continue Eliquis .   Has PAF,  Takes propranolol occasionally .   2.  HTN:   Well controlled. Continue meds.     Medication Adjustments/Labs and Tests Ordered: Current medicines are reviewed at length with the patient today.  Concerns regarding medicines are outlined above.  No orders of the defined types were placed in this encounter.  No orders of the defined types were placed in this encounter.   Signed, Mertie Moores, MD  04/19/2018 9:31 AM    [place]

## 2018-04-30 ENCOUNTER — Encounter: Payer: Self-pay | Admitting: Internal Medicine

## 2018-04-30 ENCOUNTER — Encounter: Payer: Self-pay | Admitting: Family Medicine

## 2018-04-30 ENCOUNTER — Ambulatory Visit (INDEPENDENT_AMBULATORY_CARE_PROVIDER_SITE_OTHER): Payer: Medicare Other | Admitting: Internal Medicine

## 2018-04-30 VITALS — BP 126/74 | HR 81 | Ht 75.0 in | Wt 227.0 lb

## 2018-04-30 DIAGNOSIS — E1142 Type 2 diabetes mellitus with diabetic polyneuropathy: Secondary | ICD-10-CM

## 2018-04-30 DIAGNOSIS — E785 Hyperlipidemia, unspecified: Secondary | ICD-10-CM

## 2018-04-30 DIAGNOSIS — E1165 Type 2 diabetes mellitus with hyperglycemia: Secondary | ICD-10-CM

## 2018-04-30 DIAGNOSIS — E669 Obesity, unspecified: Secondary | ICD-10-CM

## 2018-04-30 LAB — POCT GLYCOSYLATED HEMOGLOBIN (HGB A1C): Hemoglobin A1C: 6.2 % — AB (ref 4.0–5.6)

## 2018-04-30 MED ORDER — GLIPIZIDE 5 MG PO TABS: ORAL_TABLET | ORAL | 3 refills | Status: DC

## 2018-04-30 NOTE — Addendum Note (Signed)
Addended by: Drucilla Schmidt on: 04/30/2018 04:42 PM   Modules accepted: Orders

## 2018-04-30 NOTE — Progress Notes (Signed)
Patient ID: Robert [name], male   DOB: 10-04-1951, 67 y.o.   MRN: [id number]   HPI: Robert [name] is a 67 y.o.-year-old male, returning for follow-up for  DM2, dx in 2000s, insulin-dependent since 2018, uncontrolled, with complications (PN, ED).  Last visit 3 months ago.  Before last visit, his son was dx'ed with Multiple Myeloma >> pt was very distressed. Now son doing better, preparing for chemotherapy.  Last hemoglobin A1c was: Lab Results  Component Value Date   HGBA1C 6.6 01/29/2018   HGBA1C 7.6 (H) 10/04/2017   HGBA1C 7.7 (H) 07/05/2017   Pt is on a regimen of: - Metformin 1000 mg 2x a day, with meals - Glipizide 10 >> 5 mg 2x a day before meals >> 5 mg before b'fast (he had to stop the evening glipizide because of low blood sugars - lowest: 51) - Trulicity 4.62 mg weekly -started 11/2017 We stopped Novolog 70/30 10 units 2x a day in 11/2017, when starting Trulicity.  Pt checks his sugars 3-4 X a day: - am: 120-176 >> 121-146, 170 >> 119-142 - 2h after b'fast: n/c >> 77, 99-157, 181, 234 >> 99-137, 164 - before lunch: 150-160 >> 97-130 >> 90-111 - 2h after lunch: n/c >> 90-149 >> 101-161, 236, 218 (peach cobbler) - before dinner: 110-140 >> 109-141 >> 81-159, 159 - 2h after dinner: n/c >> 125-188 >> 96-171 - bedtime: 74-110 >> 93-107 >> 88-139 - nighttime: n/c >> 73 >> 51/62, 80-126 Lowest sugar was 28 right after starting insulin >> 77 >> 51 (before stopping Glipizide at night); he has hypoglycemia awareness in the 70s. Highest sugar was 280 (Txgiving) >> 234 >> 236.  Glucometer: ReliOn  Pt's meals are: - Breakfast: whole wheat toast + butter, 1x a weeK: eggs, cheese - Lunch: tuna salad or tuna sandwich ([employer] delivery), soft taco, meat + baked beans or potatoes - Dinner: [employer] delivery - Snacks: 1-0: nuts, bananas, other fruit He decreased his alcohol intake in last months. He swims 5 times a week.  -No CKD, last BUN/creatinine:  Lab  Results  Component Value Date   BUN 19 10/04/2017   BUN 19 07/05/2017   CREATININE 1.02 10/04/2017   CREATININE 0.99 07/05/2017  On  lisinopril 20. -+ HL; last set of lipids: Lab Results  Component Value Date   CHOL 139 03/01/2017   HDL 38.70 (L) 03/01/2017   LDLCALC 62 03/01/2017   LDLDIRECT 77.0 07/07/2015   TRIG 192.0 (H) 03/01/2017   CHOLHDL 4 03/01/2017  On Lipitor 40 every other day and CoQ10. - last eye exam was 04/17/2018: No DR, + Small cataract - + numbness and tingling in his feet.  He has a h/o arrhythmia (A fib) - wore an event monitor >> now on beta blocker. Cardiologist: Dr. Acie Fredrickson.  ROS: Constitutional: no weight gain/no weight loss, + fatigue, no subjective hyperthermia, no subjective hypothermia Eyes: no blurry vision, no xerophthalmia ENT: no sore throat, no nodules palpated in throat, no dysphagia, no odynophagia, no hoarseness Cardiovascular: no CP/no SOB/no palpitations/no leg swelling Respiratory: no cough/no SOB/no wheezing Gastrointestinal: no N/no V/+ D/no C/no acid reflux Musculoskeletal: no muscle aches/no joint aches Skin: no rashes, no hair loss Neurological: no tremors/+ numbness/+ tingling/no dizziness  I reviewed pt's medications, allergies, PMH, social hx, family hx, and changes were documented in the history of present illness. Otherwise, unchanged from my initial visit note.  Past Medical History:  Diagnosis Date  . Diabetes mellitus type [name]   .  HLD (hyperlipidemia)   . HTN (hypertension)   . Hx of colonic polyps   . Skin cancer    history of   Past Surgical History:  Procedure Laterality Date  . COLONOSCOPY    . PATELLAR TENDON REPAIR     Left  . Removal of excess tissue     Right and Left upper eyelids   Social History   Socioeconomic History  . Marital status: Married    Spouse name: Not on file  . Number of children: 2  Social Needs  Occupational History  . Occupation: DVD's  . Occupation: Administrator, arts: Self Employed, retired  Tobacco Use  . Smoking status: Former Smoker    Types: Cigars    Last attempt to quit: 02/22/2011  . Smokeless tobacco: Never Used  . Tobacco comment: 2 cigars monthly or less  Substance and Sexual Activity  . Alcohol use: Yes     0.0 oz    Vodka, 2-3 drinks 3x a week   Drugs: No   Current Outpatient Medications on File Prior to Visit  Medication Sig Dispense Refill  . apixaban (ELIQUIS) 5 MG TABS tablet Take 1 tablet (5 mg total) by mouth 2 (two) times daily. 60 tablet 11  . atorvastatin (LIPITOR) 40 MG tablet TAKE 1 TABLET (40 MG TOTAL) BY MOUTH DAILY. 90 tablet 4  . Coenzyme Q10 (COQ10 PO) Take 1 capsule by mouth daily.     . Dulaglutide (TRULICITY) 0.93 AT/5.5DD SOPN Inject 1.5 mg weekly under skin 4 pen 5  . glipiZIDE (GLUCOTROL) 10 MG tablet TAKE 1 TABLET BY MOUTH TWICE A DAY BEFORE A MEAL 200 tablet 4  . glucose blood test strip 1 each by Other route daily. Use daily for glucose control - Dx 250.00 100 each 0  . hydrochlorothiazide (MICROZIDE) 12.5 MG capsule TAKE 1 CAPSULE BY MOUTH EVERY DAY 90 capsule 2  . lisinopril (PRINIVIL,ZESTRIL) 40 MG tablet TAKE 1 AND 1/2 TABLET BY MOUTH EVERY DAY 150 tablet 3  . metFORMIN (GLUCOPHAGE) 1000 MG tablet TAKE 1 TABLET (1,000 MG TOTAL) BY MOUTH 2 (TWO) TIMES DAILY WITH A MEAL. 180 tablet 2  . propranolol (INDERAL) 10 MG tablet Take 1 tablet (10 mg total) by mouth 4 (four) times daily as needed (palpitations, irregular or fast heart rate). 90 tablet 3   No current facility-administered medications on file prior to visit.    No Known Allergies Family History  Problem Relation Age of Onset  . Aortic aneurysm Unknown        family history  . Cancer Unknown        Breast (1st degree relative)  . Colon cancer Neg Hx     PE: BP 126/74   Pulse 81   Ht _0  (1.905 m)   Wt 227 lb (103 kg)   SpO2 97%   BMI 28.37 kg/m  Wt Readings from Last 3 Encounters:  04/30/18 227 lb (103 kg)  04/19/18 229 lb 1.9 oz  (103.9 kg)  01/29/18 237 lb 6.4 oz (107.7 kg)   Constitutional: overweight, in NAD Eyes: PERRLA, EOMI, no exophthalmos ENT: moist mucous membranes, no thyromegaly, no cervical lymphadenopathy Cardiovascular: RRR, No MRG Respiratory: CTA B Gastrointestinal: abdomen soft, NT, ND, BS+ Musculoskeletal: no deformities, strength intact in all 4 Skin: moist, warm, no rashes Neurological: no tremor with outstretched hands, DTR normal in all 4  ASSESSMENT: 1. DM2, insulin-dependent, uncontrolled, with complications - PN - ED  2. HL  3. Obesity  PLAN:  1. Patient with long-standing, uncontrolled, type 2 diabetes, on oral antidiabetic regimen and GLP-1 receptor agonist.  He was previously on premixed insulin, but changed to a GLP-1 receptor agonist with very good results.  He is still on the low-dose Trulicity as his sugars are at or very close to goal.  At last visit, I advised him to decrease the glipizide dose and possibly even stop if sugars remain controlled. - At this visit, sugars are better, at or slightly above target in the morning, but almost all at goal in the list of the day.  Before stopping his glipizide in the evening, he had one low blood sugar at 51 at bedtime.  Since then, sugars are at goal.  He has occasional hyperglycemia after lunch when he eats sweets (200s). - No changes are needed in his regimen for now, however, I advised him to check his sugars consistently and if he sees lower blood sugars in the morning or midday, to also stop the glipizide in a.m. - I suggested to:  Patient Instructions  Please continue: - Metformin 1000 mg 2x a day with meals - Glipizide 5 mg before breakfast - Trulicity 3.66 mg weekly  Please return in 3 months with your sugar log.   - today, HbA1c is 6.2% (better) - continue checking sugars at different times of the day - check 1x a day, rotating checks - advised for yearly eye exams >> he is UTD - Return to clinic in 4 mo with sugar  log   2.  HL - Reviewed latest lipid panel from 02/2017: LDL at goal, improved, TG high, HDL low - Continues Li[pitor without side effects. Also on CoQ10. - He has an appointment with Dr. Sherren Mocha in September   3. Obesity - lost ~15 lbs after starting GLP1 R agonist!  [name], MD PhD [facility] Endocrinology

## 2018-04-30 NOTE — Patient Instructions (Signed)
Please continue: - Metformin 1000 mg 2x a day with meals - Glipizide 5 mg before breakfast - Trulicity 5.07 mg weekly  Please return in 4 months with your sugar log.

## 2018-05-11 ENCOUNTER — Other Ambulatory Visit: Payer: Self-pay | Admitting: Family Medicine

## 2018-05-14 NOTE — Telephone Encounter (Signed)
Last OV 04/30/2018   Sent to PCP to advise

## 2018-05-28 ENCOUNTER — Other Ambulatory Visit: Payer: Self-pay

## 2018-06-03 ENCOUNTER — Emergency Department (HOSPITAL_COMMUNITY)
Admission: EM | Admit: 2018-06-03 | Discharge: 2018-06-03 | Disposition: A | Discharge status: Home / Self Care | Payer: Medicare Other | Attending: Emergency Medicine | Admitting: Emergency Medicine

## 2018-06-03 ENCOUNTER — Other Ambulatory Visit: Payer: Self-pay

## 2018-06-03 ENCOUNTER — Encounter (HOSPITAL_COMMUNITY): Payer: Self-pay

## 2018-06-03 DIAGNOSIS — Z7984 Long term (current) use of oral hypoglycemic drugs: Secondary | ICD-10-CM | POA: Insufficient documentation

## 2018-06-03 DIAGNOSIS — Z7901 Long term (current) use of anticoagulants: Secondary | ICD-10-CM | POA: Diagnosis not present

## 2018-06-03 DIAGNOSIS — E119 Type 2 diabetes mellitus without complications: Secondary | ICD-10-CM | POA: Diagnosis not present

## 2018-06-03 DIAGNOSIS — N390 Urinary tract infection, site not specified: Secondary | ICD-10-CM

## 2018-06-03 DIAGNOSIS — Z87891 Personal history of nicotine dependence: Secondary | ICD-10-CM | POA: Diagnosis not present

## 2018-06-03 DIAGNOSIS — Z79899 Other long term (current) drug therapy: Secondary | ICD-10-CM | POA: Diagnosis not present

## 2018-06-03 DIAGNOSIS — I10 Essential (primary) hypertension: Secondary | ICD-10-CM | POA: Insufficient documentation

## 2018-06-03 DIAGNOSIS — Z85828 Personal history of other malignant neoplasm of skin: Secondary | ICD-10-CM | POA: Insufficient documentation

## 2018-06-03 DIAGNOSIS — R300 Dysuria: Secondary | ICD-10-CM | POA: Diagnosis present

## 2018-06-03 LAB — URINALYSIS, ROUTINE W REFLEX MICROSCOPIC
BILIRUBIN URINE: NEGATIVE
Hgb urine dipstick: NEGATIVE
Ketones, ur: NEGATIVE mg/dL
LEUKOCYTES UA: NEGATIVE
NITRITE: POSITIVE — AB
PH: 6 (ref 5.0–8.0)
PROTEIN: 30 mg/dL — AB
Specific Gravity, Urine: 1.015 (ref 1.005–1.030)

## 2018-06-03 MED ORDER — CEPHALEXIN 500 MG PO CAPS: 500.0000 mg | ORAL_CAPSULE | Freq: Four times a day (QID) | ORAL | 0 refills | Status: DC

## 2018-06-03 MED ORDER — CEPHALEXIN 500 MG PO CAPS
500.0000 mg | ORAL_CAPSULE | Freq: Once | ORAL | Status: AC
  Administered 2018-06-03: 500 mg via ORAL
  Filled 2018-06-03: qty 1

## 2018-06-03 NOTE — ED Notes (Signed)
Post void residual on bladder scan 63ml

## 2018-06-03 NOTE — ED Provider Notes (Signed)
[facility] DEPT Provider Note   CSN: [id number] Arrival date & time: 06/03/18  2207     History   Chief Complaint Chief Complaint  Patient presents with  . Urinary Frequency  . Fever    HPI Robert [name] is a 67 y.o. male.  HPI   He presents for evaluation of urinary hesitancy, dysuria, urinary frequency and fever present for 2 days.  No nausea, vomiting, weakness or dizziness.  He is using Tylenol for fever, last taken about an hour prior to admission.  No history of urinary tract infections.  No history of prostatitis.  There are no other known modifying factors.  Past Medical History:  Diagnosis Date  . Diabetes mellitus type [name]   . HLD (hyperlipidemia)   . HTN (hypertension)   . Hx of colonic polyps   . Skin cancer    history of    Patient Active Problem List   Diagnosis Date Noted  . Obesity 04/30/2018  . Pain in right lower leg 03/01/2017  . Poorly controlled type 2 diabetes mellitus with peripheral neuropathy ([place]) 07/07/2015  . Snoring 03/12/2013  . Irregular heart rate 06/23/2011  . Hx of colonic polyps   . ERECTILE DYSFUNCTION 12/19/2008  . SKIN CANCER, HX OF 12/19/2008  . Hyperlipidemia 08/21/2007  . Essential hypertension 08/21/2007    Past Surgical History:  Procedure Laterality Date  . COLONOSCOPY    . PATELLAR TENDON REPAIR     Left  . Removal of excess tissue     Right and Left upper eyelids        Home Medications    Prior to Admission medications   Medication Sig Start Date End Date Taking? Authorizing Provider  apixaban (ELIQUIS) 5 MG TABS tablet Take 1 tablet (5 mg total) by mouth 2 (two) times daily. 11/06/17   Nahser, [name], MD  atorvastatin (LIPITOR) 40 MG tablet TAKE 1 TABLET (40 MG TOTAL) BY MOUTH DAILY. 02/06/18   Dorena Cookey, MD  cephALEXin (KEFLEX) 500 MG capsule Take 1 capsule (500 mg total) by mouth 4 (four) times daily. 06/03/18   Daleen Bo, MD  Coenzyme Q10 (COQ10 PO)  Take 1 capsule by mouth daily.     [provider]  Dulaglutide (TRULICITY) 8.54 OE/7.0JJ SOPN Inject 1.5 mg weekly under skin 03/22/18   [name], MD  glipiZIDE (GLUCOTROL) 5 MG tablet TAKE 1 TABLET BY MOUTH  BEFORE B'fast 04/30/18   [name], MD  glucose blood test strip 1 each by Other route daily. Use daily for glucose control - Dx 250.00 06/14/12   Dorena Cookey, MD  hydrochlorothiazide (MICROZIDE) 12.5 MG capsule TAKE 1 CAPSULE BY MOUTH EVERY DAY 04/19/18   Dorena Cookey, MD  lisinopril (PRINIVIL,ZESTRIL) 40 MG tablet TAKE 1 AND 1/2 TABLET BY MOUTH EVERY DAY 11/27/17   Dorena Cookey, MD  metFORMIN (GLUCOPHAGE) 1000 MG tablet TAKE 1 TABLET (1,000 MG TOTAL) BY MOUTH 2 (TWO) TIMES DAILY WITH A MEAL. 01/08/18   Dorena Cookey, MD  propranolol (INDERAL) 10 MG tablet Take 1 tablet (10 mg total) by mouth 4 (four) times daily as needed (palpitations, irregular or fast heart rate). 11/06/17   Nahser, [name], MD  sildenafil (REVATIO) 20 MG tablet TAKE 1 TO 2 TABLETS 2 TO 3 HOURS BEFORE SEX 05/21/18   Dorena Cookey, MD    Family History Family History  Problem Relation Age of Onset  . Aortic aneurysm Unknown  family history  . Cancer Unknown        Breast (1st degree relative)  . Colon cancer Neg Hx     Social History Social History   Tobacco Use  . Smoking status: Former Smoker    Types: Cigars    Last attempt to quit: 02/22/2011    Years since quitting: 7.2  . Smokeless tobacco: Never Used  . Tobacco comment: 2 cigars monthly or less  Substance Use Topics  . Alcohol use: Yes    Alcohol/week: 0.0 standard drinks    Comment: 6-8oz /wk  . Drug use: No     Allergies   Patient has no known allergies.   Review of Systems Review of Systems  All other systems reviewed and are negative.    Physical Exam Updated Vital Signs BP (!) 159/75   Pulse (!) 108   Temp 100 F (37.8 C) (Oral)   Resp 18   SpO2 98%   Physical Exam  Constitutional: He  is oriented to person, place, and time. He appears well-developed and well-nourished. No distress.  HENT:  Head: Normocephalic and atraumatic.  Right Ear: External ear normal.  Left Ear: External ear normal.  Eyes: Pupils are equal, round, and reactive to light. Conjunctivae and EOM are normal.  Neck: Normal range of motion and phonation normal. Neck supple.  Cardiovascular: Normal rate, regular rhythm and normal heart sounds.  Pulmonary/Chest: Effort normal and breath sounds normal. He exhibits no bony tenderness.  Abdominal: Soft. There is no tenderness.  Musculoskeletal: Normal range of motion.  Neurological: He is alert and oriented to person, place, and time. No cranial nerve deficit or sensory deficit. He exhibits normal muscle tone. Coordination normal.  Skin: Skin is warm, dry and intact.  Psychiatric: He has a normal mood and affect. His behavior is normal. Judgment and thought content normal.  Nursing note and vitals reviewed.    ED Treatments / Results  Labs (all labs ordered are listed, but only abnormal results are displayed) Labs Reviewed  URINALYSIS, ROUTINE W REFLEX MICROSCOPIC - Abnormal; Notable for the following components:      Result Value   Color, Urine AMBER (*)    Glucose, UA >=500 (*)    Protein, ur 30 (*)    Nitrite POSITIVE (*)    Bacteria, UA MANY (*)    All other components within normal limits    EKG None  Radiology No results found.  Procedures Procedures (including critical care time)  Medications Ordered in ED Medications  cephALEXin (KEFLEX) capsule 500 mg (has no administration in time range)     Initial Impression / Assessment and Plan / ED Course  I have reviewed the triage vital signs and the nursing notes.  Pertinent labs & imaging results that were available during my care of the patient were reviewed by me and considered in my medical decision making (see chart for details).  Clinical Course as of Jun 04 2315  Nancy Fetter Jun 03, 2018  2245 Post void bladder scan no urine present   [EW]  2311 Normal except presence of glucose, protein, nitrite; with 11-20 WBC and many bacteria.  Urinalysis, Routine w reflex microscopic- may I&O cath if menses(!) [EW]    Clinical Course User Index [EW] Daleen Bo, MD     Patient Vitals for the past 24 hrs:  BP Temp Temp src Pulse Resp SpO2  06/03/18 2218 (!) 159/75 100 F (37.8 C) Oral (!) 108 18 98 %    11:12  PM Reevaluation with update and discussion. After initial assessment and treatment, an updated evaluation reveals no change in clinical status.  Findings discussed, questions answered and will be given a dose of Keflex prior to departure. Daleen Bo   Medical Decision Making: UTI without signs for sepsis, metabolic instability or suggestion for impending vascular collapse.  CRITICAL CARE-no Performed by: Daleen Bo   Nursing Notes Reviewed/ Care Coordinated Applicable Imaging Reviewed Interpretation of Laboratory Data incorporated into ED treatment  The patient appears reasonably screened and/or stabilized for discharge and I doubt any other medical condition or other [facility] requiring further screening, evaluation, or treatment in the ED at this time prior to discharge.  Plan: Home Medications-continue usual, Tylenol for fever or pain; Home Treatments-rest, fluids; return here if the recommended treatment, does not improve the symptoms; Recommended follow up-PCP follow-up 1 week.      Final Clinical Impressions(s) / ED Diagnoses   Final diagnoses:  Urinary tract infection without hematuria, site unspecified    ED Discharge Orders         Ordered    cephALEXin (KEFLEX) 500 MG capsule  4 times daily     06/03/18 2316           Daleen Bo, MD 06/03/18 2316

## 2018-06-03 NOTE — Discharge Instructions (Signed)
Get plenty of rest, and drink a lot of fluids.  Use Tylenol 650 mg to 1000 mg every 4-6 hours as needed for fever or pain.  Follow-up with your primary care doctor for checkup in 1 week.  Prescription was sent to your pharmacy, to start taking tomorrow morning.

## 2018-06-03 NOTE — ED Triage Notes (Signed)
Pt c/o of initial urinary retention and some pain beginning yesterday. Pt states today he feels "lousy" with fever as high as 102 at home and some urinary urgency. Last tylenol dose was 500mg  at 5pm today.Currently taking azo for sx relief.

## 2018-06-20 ENCOUNTER — Encounter: Payer: Self-pay | Admitting: Family Medicine

## 2018-06-22 ENCOUNTER — Ambulatory Visit (INDEPENDENT_AMBULATORY_CARE_PROVIDER_SITE_OTHER): Payer: Medicare Other | Admitting: Adult Health

## 2018-06-22 ENCOUNTER — Encounter: Payer: Self-pay | Admitting: Adult Health

## 2018-06-22 VITALS — BP 120/74 | Temp 97.4°F | Wt 230.0 lb

## 2018-06-22 DIAGNOSIS — R300 Dysuria: Secondary | ICD-10-CM

## 2018-06-22 LAB — POC URINALSYSI DIPSTICK (AUTOMATED)
Bilirubin, UA: NEGATIVE
Glucose, UA: NEGATIVE
KETONES UA: NEGATIVE
Nitrite, UA: NEGATIVE
PH UA: 6 (ref 5.0–8.0)
PROTEIN UA: POSITIVE — AB
Spec Grav, UA: 1.02 (ref 1.010–1.025)
UROBILINOGEN UA: 0.2 U/dL

## 2018-06-22 MED ORDER — SULFAMETHOXAZOLE-TRIMETHOPRIM 800-160 MG PO TABS: 1.0000 | ORAL_TABLET | Freq: Two times a day (BID) | ORAL | 0 refills | Status: AC

## 2018-06-22 NOTE — Progress Notes (Signed)
Subjective:    Patient ID: Robert [name], male    DOB: [birth year], 67 y.o.   MRN: [id number]  HPI 67 year old male who  has a past medical history of Diabetes mellitus type II, HLD (hyperlipidemia), HTN (hypertension), colonic polyps, and Skin cancer.  He presents to the office today for the acute complaint of dysuria, frequency,urgency and fever. He was seen in the ER on 8/11 for the same symptoms and was found to have a UTI, he was treated with Keflex 500 mg QID. He reports finishing the course of antibiotics but continues to have dysuria, urgency, and frequency. Urine Culture was not sent from the ER .  He denies any fevers, abdominal pain, n/v/d/. Does report "soreness in my low back".    Review of Systems See HPI   Past Medical History:  Diagnosis Date  . Diabetes mellitus type II   . HLD (hyperlipidemia)   . HTN (hypertension)   . Hx of colonic polyps   . Skin cancer    history of    Social History   Socioeconomic History  . Marital status: Married    Spouse name: Not on file  . Number of children: Not on file  . Years of education: Not on file  . Highest education level: Not on file  Occupational History  . Occupation: DVD's  . Occupation: Cabin crew: Self Employed  Social Needs  . Financial resource strain: Not on file  . Food insecurity:    Worry: Not on file    Inability: Not on file  . Transportation needs:    Medical: Not on file    Non-medical: Not on file  Tobacco Use  . Smoking status: Former Smoker    Types: Cigars    Last attempt to quit: 02/22/2011    Years since quitting: 7.3  . Smokeless tobacco: Never Used  . Tobacco comment: 2 cigars monthly or less  Substance and Sexual Activity  . Alcohol use: Yes    Alcohol/week: 0.0 standard drinks    Comment: 6-8oz /wk  . Drug use: No  . Sexual activity: Not on file  Lifestyle  . Physical activity:    Days per week: Not on file    Minutes per session: Not on file  .  Stress: Not on file  Relationships  . Social connections:    Talks on phone: Not on file    Gets together: Not on file    Attends religious service: Not on file    Active member of club or organization: Not on file    Attends meetings of clubs or organizations: Not on file    Relationship status: Not on file  . Intimate partner violence:    Fear of current or ex partner: Not on file    Emotionally abused: Not on file    Physically abused: Not on file    Forced sexual activity: Not on file  [name] Topics Concern  . Not on file  Social History Narrative  . Not on file    Past Surgical History:  Procedure Laterality Date  . COLONOSCOPY    . PATELLAR TENDON REPAIR     Left  . Removal of excess tissue     Right and Left upper eyelids    Family History  Problem Relation Age of Onset  . Aortic aneurysm Unknown        family history  . Cancer Unknown  Breast (1st degree relative)  . Colon cancer Neg Hx     No Known Allergies  Current Outpatient Medications on File Prior to Visit  Medication Sig Dispense Refill  . apixaban (ELIQUIS) 5 MG TABS tablet Take 1 tablet (5 mg total) by mouth 2 (two) times daily. 60 tablet 11  . atorvastatin (LIPITOR) 40 MG tablet TAKE 1 TABLET (40 MG TOTAL) BY MOUTH DAILY. 90 tablet 4  . Coenzyme Q10 (COQ10 PO) Take 1 capsule by mouth daily.     . Dulaglutide (TRULICITY) 5.88 FO/2.7XA SOPN Inject 1.5 mg weekly under skin 4 pen 5  . glipiZIDE (GLUCOTROL) 5 MG tablet TAKE 1 TABLET BY MOUTH  BEFORE B'fast 90 tablet 3  . glucose blood test strip 1 each by [name] route daily. Use daily for glucose control - Dx 250.00 100 each 0  . hydrochlorothiazide (MICROZIDE) 12.5 MG capsule TAKE 1 CAPSULE BY MOUTH EVERY DAY 90 capsule 2  . lisinopril (PRINIVIL,ZESTRIL) 40 MG tablet TAKE 1 AND 1/2 TABLET BY MOUTH EVERY DAY 150 tablet 3  . metFORMIN (GLUCOPHAGE) 1000 MG tablet TAKE 1 TABLET (1,000 MG TOTAL) BY MOUTH 2 (TWO) TIMES DAILY WITH A MEAL. 180 tablet 2  .  propranolol (INDERAL) 10 MG tablet Take 1 tablet (10 mg total) by mouth 4 (four) times daily as needed (palpitations, irregular or fast heart rate). 90 tablet 3  . sildenafil (REVATIO) 20 MG tablet TAKE 1 TO 2 TABLETS 2 TO 3 HOURS BEFORE SEX 30 tablet 11   No current facility-administered medications on file prior to visit.     BP 120/74   Temp (!) 97.4 F (36.3 C) (Temporal)   Wt 230 lb (104.3 kg)   BMI 28.75 kg/m       Objective:   Physical Exam  Constitutional: He is oriented to person, place, and time. He appears well-developed and well-nourished. No distress.  Cardiovascular: Normal rate, regular rhythm, normal heart sounds and intact distal pulses.  Pulmonary/Chest: Effort normal and breath sounds normal.  Abdominal: Soft. Bowel sounds are normal. He exhibits no distension and no mass. There is no tenderness. There is CVA tenderness. There is no rebound and no guarding. No hernia.  Neurological: He is alert and oriented to person, place, and time.  Skin: Skin is warm and dry. He is not diaphoretic.  Psychiatric: He has a normal mood and affect. His behavior is normal. Judgment and thought content normal.  Nursing note and vitals reviewed.     Assessment & Plan:  1. Dysuria  - POCT Urinalysis Dipstick (Automated) +2 leuks, blood, and protein.  - Will send culture and treat with Bactrim due to symptoms  - sulfamethoxazole-trimethoprim (BACTRIM DS,SEPTRA DS) 800-160 MG tablet; Take 1 tablet by mouth 2 (two) times daily for 5 days.  Dispense: 10 tablet; Refill: 0 - Urine Culture   Dorothyann Peng, NP

## 2018-06-22 NOTE — Telephone Encounter (Signed)
I called the pt and he stated someone scheduled an appt with Dr Sherren Mocha next week.  He stated he is still having a lot of discomfort and would like to be seen today if possible.  Appt scheduled to see [facility] today at [place].

## 2018-06-24 LAB — URINE CULTURE
MICRO NUMBER:: 91041656
SPECIMEN QUALITY: ADEQUATE

## 2018-06-26 ENCOUNTER — Ambulatory Visit: Payer: Medicare Other | Admitting: Family Medicine

## 2018-06-29 ENCOUNTER — Telehealth: Payer: Self-pay | Admitting: Internal Medicine

## 2018-06-29 MED ORDER — DULAGLUTIDE 0.75 MG/0.5ML ~~LOC~~ SOAJ: SUBCUTANEOUS | 5 refills | Status: DC

## 2018-06-29 NOTE — Telephone Encounter (Signed)
Sent!

## 2018-06-29 NOTE — Telephone Encounter (Signed)
Dulaglutide (TRULICITY) 5.62 BW/3.8LH SOPN   Patient needs prescription sent into the pharmacy        [id number] - , [place]

## 2018-07-12 ENCOUNTER — Ambulatory Visit (INDEPENDENT_AMBULATORY_CARE_PROVIDER_SITE_OTHER): Payer: Medicare Other | Admitting: Family

## 2018-07-12 ENCOUNTER — Other Ambulatory Visit: Payer: Self-pay

## 2018-07-12 ENCOUNTER — Ambulatory Visit (HOSPITAL_COMMUNITY)
Admission: RE | Admit: 2018-07-12 | Discharge: 2018-07-12 | Disposition: A | Discharge status: Home / Self Care | Payer: Medicare Other | Source: Ambulatory Visit | Attending: Family | Admitting: Family

## 2018-07-12 ENCOUNTER — Encounter: Payer: Self-pay | Admitting: Family

## 2018-07-12 VITALS — BP 124/84 | HR 77 | Temp 98.0°F | Resp 20 | Ht 75.0 in | Wt 225.0 lb

## 2018-07-12 DIAGNOSIS — E1151 Type 2 diabetes mellitus with diabetic peripheral angiopathy without gangrene: Secondary | ICD-10-CM | POA: Diagnosis not present

## 2018-07-12 DIAGNOSIS — R0989 Other specified symptoms and signs involving the circulatory and respiratory systems: Secondary | ICD-10-CM | POA: Diagnosis present

## 2018-07-12 DIAGNOSIS — I779 Disorder of arteries and arterioles, unspecified: Secondary | ICD-10-CM | POA: Insufficient documentation

## 2018-07-12 DIAGNOSIS — I480 Paroxysmal atrial fibrillation: Secondary | ICD-10-CM

## 2018-07-12 DIAGNOSIS — Z87891 Personal history of nicotine dependence: Secondary | ICD-10-CM | POA: Diagnosis not present

## 2018-07-12 DIAGNOSIS — Z7901 Long term (current) use of anticoagulants: Secondary | ICD-10-CM

## 2018-07-12 DIAGNOSIS — R9389 Abnormal findings on diagnostic imaging of other specified body structures: Secondary | ICD-10-CM | POA: Insufficient documentation

## 2018-07-12 DIAGNOSIS — I10 Essential (primary) hypertension: Secondary | ICD-10-CM | POA: Insufficient documentation

## 2018-07-12 DIAGNOSIS — E785 Hyperlipidemia, unspecified: Secondary | ICD-10-CM | POA: Insufficient documentation

## 2018-07-12 NOTE — Progress Notes (Signed)
[facility] [place]   CC: Follow up peripheral artery occlusive disease  History of Present Illness Robert [name] is a 67 y.o. male whom Dr. Oneida Alar saw on initial evaluation on 03-29-17, at that time he had a 5 month history of tightness achiness in his right calf with walking. This was relieved by rest. He does not describe rest pain in his foot. He has no history of nonhealing wounds. He states that  his symptoms were inconsistent that sometimes he can mow the grass and have problems and then other times he can mow his grass with no symptoms whatsoever. He smoked 1 cigar on the weekends, has done this for years, but he quit after seeing Dr. Oneida Alar at the initial visit. Dr. Oneida Alar counseled pt against tobacco use and informed of the risk of limb loss with diabetes and tobacco use together of 100 fold.Other medical problems include hypertension and hyperlipidemia.   At his initial visit, Dr. Oneida Alar assessment was patient had mild nondisabling claudication symptoms of right lower extremity. 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 His symptoms were fairly inconsistent and not really disabling to him. Walking program of 30 minutes daily, follow-up with repeat ABIs and see our nurse practitioner in 6 months. Dr. Oneida Alar discussed with the patient the possibility of an arteriogram at some point future if his symptoms become more disabling but currently not warranted.  He has been diagnosed with right sciatica in the past, had resolved, has recently recurred but is not life limiting for him; states he will discuss this with his PCP.   He swims 5x/week. He walks 30 minutes 3-4 days/ week.  He no longer has claudication sx's in his legs with walking.   He denies any history of stroke or TIA.   Diabetic: Yes, A1C was 6.2 on 04-30-18, improved from 7.6 on 10-04-17 (review of records) Tobacco use: former smoker, quit in June  2018, stopped cigarettes about 2014, started "as a kid"  Pt meds include: Statin :Yes Betablocker: Yes ASA: Yes Other anticoagulants/antiplatelets: Eliquis, diagnosed with paroxysmal atrial fib in 2019, his cardiologist is Dr. Acie Fredrickson    Past Medical History:  Diagnosis Date  . Diabetes mellitus type [name]   . HLD (hyperlipidemia)   . HTN (hypertension)   . Hx of colonic polyps   . Skin cancer    history of    Social History Social History   Tobacco Use  . Smoking status: Former Smoker    Types: Cigars    Last attempt to quit: 02/22/2011    Years since quitting: 7.3  . Smokeless tobacco: Never Used  . Tobacco comment: 2 cigars monthly or less  Substance Use Topics  . Alcohol use: Yes    Alcohol/week: 0.0 standard drinks    Comment: 6-8oz /wk  . Drug use: No    Family History Family History  Problem Relation Age of Onset  . Aortic aneurysm Unknown        family history  . Cancer Unknown        Breast (1st degree relative)  . Colon cancer Neg Hx     Past Surgical History:  Procedure Laterality Date  . COLONOSCOPY    . PATELLAR TENDON REPAIR     Left  . Removal of excess tissue     Right and Left upper eyelids    No Known Allergies  Current Outpatient Medications  Medication Sig Dispense Refill  . apixaban (  ELIQUIS) 5 MG TABS tablet Take 1 tablet (5 mg total) by mouth 2 (two) times daily. 60 tablet 11  . atorvastatin (LIPITOR) 40 MG tablet TAKE 1 TABLET (40 MG TOTAL) BY MOUTH DAILY. 90 tablet 4  . Coenzyme Q10 (COQ10 PO) Take 1 capsule by mouth daily.     . Dulaglutide (TRULICITY) 2.42 PN/3.6RW SOPN Inject 1.5 mg weekly under skin 4 pen 5  . glipiZIDE (GLUCOTROL) 5 MG tablet TAKE 1 TABLET BY MOUTH  BEFORE B'fast 90 tablet 3  . glucose blood test strip 1 each by Other route daily. Use daily for glucose control - Dx 250.00 100 each 0  . hydrochlorothiazide (MICROZIDE) 12.5 MG capsule TAKE 1 CAPSULE BY MOUTH EVERY DAY 90 capsule 2  . lisinopril  (PRINIVIL,ZESTRIL) 40 MG tablet TAKE 1 AND 1/2 TABLET BY MOUTH EVERY DAY 150 tablet 3  . metFORMIN (GLUCOPHAGE) 1000 MG tablet TAKE 1 TABLET (1,000 MG TOTAL) BY MOUTH 2 (TWO) TIMES DAILY WITH A MEAL. 180 tablet 2  . propranolol (INDERAL) 10 MG tablet Take 1 tablet (10 mg total) by mouth 4 (four) times daily as needed (palpitations, irregular or fast heart rate). 90 tablet 3  . sildenafil (REVATIO) 20 MG tablet TAKE 1 TO 2 TABLETS 2 TO 3 HOURS BEFORE SEX 30 tablet 11   No current facility-administered medications for this visit.     ROS: See HPI for pertinent positives and negatives.   Physical Examination  Vitals:   07/12/18 1536  BP: 124/84  Pulse: 77  Resp: 20  Temp: 98 F (36.7 C)  TempSrc: Oral  SpO2: 97%  Weight: 225 lb (102.1 kg)  Height: 6\' 3"  (1.905 m)   Body mass index is 28.12 kg/m.  General: A&O x 3, WDWN,  male. Gait: normal Eyes: PERRLA. Pulmonary: Respirations are non labored, CTAB, good air movement in all fields Cardiac: regular rhythm and rate, no detected murmur.         Carotid Bruits Right Left   Negative Negative   Radial pulses are 2+ palpable bilaterally   Adominal aortic pulse is not palpable                         VASCULAR EXAM: Extremities without ischemic changes, without Gangrene; without open wounds.                                                                                                                                                       LE Pulses Right Left       FEMORAL  3+ palpable  3+ palpable        POPLITEAL  not palpable   not palpable       POSTERIOR TIBIAL  not palpable   not palpable        DORSALIS  PEDIS      ANTERIOR TIBIAL not palpable  2+ palpable    Abdomen: soft, NT, no palpable masses. Musculoskeletal: no muscle wasting or atrophy.      Skin: no rashes, no cellulitis, no ulcers noted. Neurologic: A&O X 3; appropriate affect, Sensation is normal; MOTOR FUNCTION:  moving all extremities  equally, motor strength 5/5 throughout. Speech is fluent/normal. CN 2-12 intact. Psychiatric: Thought content is normal, mood appropriate for clinical situation.     ASSESSMENT: [name] is a 67 y.o. male who had a history of tightness/achiness in his right calf with walking, but this has resolved since Dr. Oneida Alar evaluated pt in June 2018.  He is walking 3-4 days/week, and swimming 5 days/week. He also stopped cigar smoking, had already stopped cigarette use about 2014.   His atherosclerotic risk factors include currently controled DM, tobacco use from a young age until June 2018, and dyslipidemia.    DATA  ABI (Date: 07/12/2018):  R:   ABI: 0.70 (was 0.74 on 10-05-17),   PT: tri  DP: bi  TBI:  0.47, toe pressure 60 (was 0.46)  L:   ABI: 1.14 (was 1.00),   PT: tri  DP: tri  TBI: 0.64, toe pressure 82 (was 0.43) Stable bilateral ABI with tri and biphasic signals on the right, triphasic on the left. Moderate disease on the right, no significant disease on the left. Stable TBI in the right, improved in the left.     PLAN:  Based on the patient's vascular studies and examination, pt will return to clinic in 1 year with ABI's. Continued excellent exercise regimen.   I discussed in depth with the patient the nature of atherosclerosis, and emphasized the importance of maximal medical management including strict control of blood pressure, blood glucose, and lipid levels, obtaining regular exercise, and continued cessation of smoking.  The patient is aware that without maximal medical management the underlying atherosclerotic disease process will progress, limiting the benefit of any interventions.  The patient was given information about PAD including signs, symptoms, treatment, what symptoms should prompt the patient to seek immediate medical care, and risk reduction measures to take.  Clemon Chambers, RN, MSN, FNP-C [facility] Phone: [phone]  Clinic MD: Oneida Alar  07/12/18 3:40 PM

## 2018-07-12 NOTE — Patient Instructions (Signed)

## 2018-07-17 ENCOUNTER — Ambulatory Visit: Payer: Medicare Other | Admitting: Family Medicine

## 2018-07-23 ENCOUNTER — Encounter: Payer: Self-pay | Admitting: Family Medicine

## 2018-07-23 ENCOUNTER — Ambulatory Visit (INDEPENDENT_AMBULATORY_CARE_PROVIDER_SITE_OTHER): Payer: Medicare Other | Admitting: Family Medicine

## 2018-07-23 VITALS — BP 102/70 | HR 76 | Temp 97.9°F | Ht 73.0 in | Wt 226.6 lb

## 2018-07-23 DIAGNOSIS — R300 Dysuria: Secondary | ICD-10-CM

## 2018-07-23 DIAGNOSIS — I499 Cardiac arrhythmia, unspecified: Secondary | ICD-10-CM

## 2018-07-23 DIAGNOSIS — R351 Nocturia: Secondary | ICD-10-CM | POA: Diagnosis not present

## 2018-07-23 DIAGNOSIS — I10 Essential (primary) hypertension: Secondary | ICD-10-CM | POA: Diagnosis not present

## 2018-07-23 DIAGNOSIS — E119 Type 2 diabetes mellitus without complications: Secondary | ICD-10-CM | POA: Diagnosis not present

## 2018-07-23 DIAGNOSIS — Z23 Encounter for immunization: Secondary | ICD-10-CM | POA: Diagnosis not present

## 2018-07-23 DIAGNOSIS — N401 Enlarged prostate with lower urinary tract symptoms: Secondary | ICD-10-CM | POA: Diagnosis not present

## 2018-07-23 DIAGNOSIS — E1165 Type 2 diabetes mellitus with hyperglycemia: Secondary | ICD-10-CM

## 2018-07-23 DIAGNOSIS — E785 Hyperlipidemia, unspecified: Secondary | ICD-10-CM | POA: Diagnosis not present

## 2018-07-23 DIAGNOSIS — E1142 Type 2 diabetes mellitus with diabetic polyneuropathy: Secondary | ICD-10-CM

## 2018-07-23 LAB — CBC WITH DIFFERENTIAL/PLATELET
Basophils Absolute: 0.1 10*3/uL (ref 0.0–0.1)
Basophils Relative: 0.4 % (ref 0.0–3.0)
EOS PCT: 0.7 % (ref 0.0–5.0)
Eosinophils Absolute: 0.1 10*3/uL (ref 0.0–0.7)
HCT: 45.2 % (ref 39.0–52.0)
Hemoglobin: 15.4 g/dL (ref 13.0–17.0)
LYMPHS ABS: 4.3 10*3/uL — AB (ref 0.7–4.0)
Lymphocytes Relative: 33.7 % (ref 12.0–46.0)
MCHC: 34.2 g/dL (ref 30.0–36.0)
MCV: 87.9 fl (ref 78.0–100.0)
MONOS PCT: 8 % (ref 3.0–12.0)
Monocytes Absolute: 1 10*3/uL (ref 0.1–1.0)
Neutro Abs: 7.4 10*3/uL (ref 1.4–7.7)
Neutrophils Relative %: 57.2 % (ref 43.0–77.0)
Platelets: 212 10*3/uL (ref 150.0–400.0)
RBC: 5.14 Mil/uL (ref 4.22–5.81)
RDW: 14.3 % (ref 11.5–15.5)
WBC: 12.9 10*3/uL — ABNORMAL HIGH (ref 4.0–10.5)

## 2018-07-23 LAB — BASIC METABOLIC PANEL
BUN: 17 mg/dL (ref 6–23)
CALCIUM: 9.4 mg/dL (ref 8.4–10.5)
CO2: 28 mEq/L (ref 19–32)
CREATININE: 0.86 mg/dL (ref 0.40–1.50)
Chloride: 104 mEq/L (ref 96–112)
GFR: 94.02 mL/min (ref >60.00)
Glucose, Bld: 81 mg/dL (ref 70–99)
Potassium: 4.3 mEq/L (ref 3.5–5.1)
SODIUM: 139 meq/L (ref 135–145)

## 2018-07-23 LAB — LIPID PANEL
Cholesterol: 140 mg/dL (ref 0–200)
HDL: 32.4 mg/dL — ABNORMAL LOW (ref >39.00)
LDL Cholesterol: 68 mg/dL (ref 0–99)
NonHDL: 107.96
Total CHOL/HDL Ratio: 4
Triglycerides: 198 mg/dL — ABNORMAL HIGH (ref 0.0–149.0)
VLDL: 39.6 mg/dL (ref 0.0–40.0)

## 2018-07-23 LAB — POCT URINALYSIS DIPSTICK
Bilirubin, UA: NEGATIVE
GLUCOSE UA: NEGATIVE
Ketones, UA: NEGATIVE
Leukocytes, UA: NEGATIVE
Nitrite, UA: NEGATIVE
PH UA: 6 (ref 5.0–8.0)
Protein, UA: NEGATIVE
RBC UA: NEGATIVE
Spec Grav, UA: 1.015 (ref 1.010–1.025)
Urobilinogen, UA: 0.2 E.U./dL

## 2018-07-23 LAB — HEPATIC FUNCTION PANEL
ALK PHOS: 67 U/L (ref 39–117)
ALT: 12 U/L (ref 0–53)
AST: 11 U/L (ref 0–37)
Albumin: 4.4 g/dL (ref 3.5–5.2)
BILIRUBIN TOTAL: 0.5 mg/dL (ref 0.2–1.2)
Bilirubin, Direct: 0.1 mg/dL (ref 0.0–0.3)
Total Protein: 6.8 g/dL (ref 6.0–8.3)

## 2018-07-23 LAB — PSA: PSA: 2.83 ng/mL (ref 0.10–4.00)

## 2018-07-23 LAB — MICROALBUMIN / CREATININE URINE RATIO
Creatinine,U: 32.3 mg/dL
Microalb Creat Ratio: 2.8 mg/g (ref 0.0–30.0)
Microalb, Ur: 0.9 mg/dL (ref 0.0–1.9)

## 2018-07-23 LAB — TSH: TSH: 1.25 u[IU]/mL (ref 0.35–4.50)

## 2018-07-23 MED ORDER — ATORVASTATIN CALCIUM 80 MG PO TABS
ORAL_TABLET | ORAL | 3 refills | Status: DC
Start: 2018-07-23 — End: 2019-02-20

## 2018-07-23 MED ORDER — HYDROCHLOROTHIAZIDE 12.5 MG PO CAPS: ORAL_CAPSULE | ORAL | 3 refills | Status: DC

## 2018-07-23 MED ORDER — METFORMIN HCL 1000 MG PO TABS: 1000.0000 mg | ORAL_TABLET | Freq: Two times a day (BID) | ORAL | 3 refills | Status: DC

## 2018-07-23 MED ORDER — LISINOPRIL 40 MG PO TABS: ORAL_TABLET | ORAL | 3 refills | Status: DC

## 2018-07-23 MED ORDER — SILDENAFIL CITRATE 20 MG PO TABS: ORAL_TABLET | ORAL | 11 refills | Status: DC

## 2018-07-23 NOTE — Progress Notes (Signed)
Robert [name]  is a delightful 67 year old married male non-smoker comes in today for annual physical examination because of the following issues  He has a history of hyperlipidemia.  He is on Lipitor 80 mg daily.  He has a history of A. fib.  He is currently on Eliquis 5 mg twice daily  He sees Dr. Lorie Apley for diabetes.  He is on a number of medications that is gotten his blood sugar and A1c back to normal.  Last A1c July 2019 was 6.2%.  His blood sugar is usually in the 110 120 range.  He is on Glucotrol 5 mg twice daily, metformin 1000 mg twice daily, and injectable medication via Dr. Lorie Apley weekly  He takes lisinopril 60 mg daily for hypertension.  BP is 110 at 102/70.  He said he is not lightheaded when he stands up.  He takes Inderal 10 mg 4 times daily as needed for palpitations and rapid heart rate  He takes generic Viagra as needed for ED  He had an episode of severe prostatitis recently.  He developed fever chills and urinary symptoms.  It started on a Saturday.  On Sunday went the emergency room and then came here on Monday and saw [name].  He is asymptomatic now except for some frequency of urination at bedtime even though his blood sugar is normal  He is also concerned about his wife who he has noticed has had decreased short-term memory.  Her mother had dementia.  Cognitive function normally he exercises daily home health safety reviewed no issues identified, no guns in the house, he does have a healthcare power of attorney and living well  EKG was done because a history as above.  EKG was normal and unchanged.  Today he is in sinus rhythm.  14 point review of system reviewed otherwise negative  He gets routine eye care, dental care, colonoscopy 2016 showed some polyps.  He is due to go back in 2021  Vaccinations up-to-date information given on the new shingles vaccine.  He was given a flu shot and a tetanus booster today  BP 102/70 (BP Location: Right Arm, Patient Position:  Sitting, Cuff Size: Large)   Pulse 76   Temp 97.9 F (36.6 C) (Oral)   Ht 6\' 1"  (1.854 m)   Wt 226 lb 9.6 oz (102.8 kg)   SpO2 100%   BMI 29.90 kg/m  Well-developed well-nourished male no acute distress vital signs stable he is afebrile.  HEENT were negative neck was supple thyroid is not enlarged no carotid bruits.  Cardiopulmonary exam normal.  Abdominal exam normal.  Genitalia normal circumcised male.  Rectal normal stool guaiac negative prostate smooth nonnodular 1+ BPH.  Extremities normal skin normal peripheral pulses normal.  1.  Diabetes at goal......... continue current treatment program as outlined by Dr. Lorie Apley  2.  Hyperlipidemia..... Continue meds check lab  3.  Hypertension at goal....... actually BP too low at 825 systolic.  Decrease lisinopril from 60 mg a day to 40  4.  Cardiac arrhythmia.......... continue Eliquis 5 mg twice daily  5 history of palpitations............ Inderal 10 mg 4 times daily as needed  6.  Mild BPH,,,,,,,,,,,,,,,, observe at this juncture

## 2018-07-23 NOTE — Patient Instructions (Signed)
Labs today...........[name] Kitchen we will call if there is anything abnormal  Continue current meds except decrease the lisinopril to 40 mg once daily in the morning  Monitor your blood pressure daily for couple weeks since we have changed her medicine.  BP goal 135/85 or less  Continue exercise program  Call your insurance company to find it we get the shingles vaccine.  Return in 1 year for general physical exam sooner if any problems

## 2018-08-01 DIAGNOSIS — J069 Acute upper respiratory infection, unspecified: Secondary | ICD-10-CM | POA: Diagnosis not present

## 2018-09-04 ENCOUNTER — Encounter: Payer: Self-pay | Admitting: Internal Medicine

## 2018-09-04 ENCOUNTER — Ambulatory Visit (INDEPENDENT_AMBULATORY_CARE_PROVIDER_SITE_OTHER): Payer: Medicare Other | Admitting: Internal Medicine

## 2018-09-04 VITALS — BP 126/80 | HR 77 | Ht 73.0 in | Wt 221.0 lb

## 2018-09-04 DIAGNOSIS — I779 Disorder of arteries and arterioles, unspecified: Secondary | ICD-10-CM | POA: Diagnosis not present

## 2018-09-04 DIAGNOSIS — E119 Type 2 diabetes mellitus without complications: Secondary | ICD-10-CM

## 2018-09-04 LAB — POCT GLYCOSYLATED HEMOGLOBIN (HGB A1C): HEMOGLOBIN A1C: 6.5 % — AB (ref 4.0–5.6)

## 2018-09-04 MED ORDER — DULAGLUTIDE 1.5 MG/0.5ML ~~LOC~~ SOAJ: 1.5000 mg | SUBCUTANEOUS | 11 refills | Status: DC

## 2018-09-04 NOTE — Addendum Note (Signed)
Addended by: Cardell Peach I on: 09/04/2018 12:47 PM   Modules accepted: Orders

## 2018-09-04 NOTE — Patient Instructions (Signed)
Please stop Glipizide.  Please continue: - Metformin 1000 mg 2x a day with meals  Increase: - Trulicity to 1.5 mg weekly.  Please return in 3-4 months with your sugar log.

## 2018-09-04 NOTE — Progress Notes (Signed)
Patient ID: Robert [name], male   DOB: 12-06-1950, 67 y.o.   MRN: [id number]   HPI: Robert [name] is a 67 y.o.-year-old male, returning for follow-up for  DM2, dx in 2000s, insulin-dependent since 2018, uncontrolled, with complications (PN, ED).  Last visit 4 months ago.  Last hemoglobin A1c was: Lab Results  Component Value Date   HGBA1C 6.2 (A) 04/30/2018   HGBA1C 6.6 01/29/2018   HGBA1C 7.6 (H) 10/04/2017   Pt is on a regimen of: - Metformin 1000 mg 2x a day, with meals - Glipizide 10 >> 5 mg 2x a day before meals >> 5 mg before breakfast (he had to stop the evening glipizide because of low blood sugars - lowest: 51) >> now 5 mg bid - Trulicity 8.75 mg weekly -started 11/2017 We stopped Novolog 70/30 10 units 2x a day in 11/2017, when starting Trulicity.  Pt checks his sugars 3-4 times a day: - am: 120-176 >> 121-146, 170 >> 119-142 >> 95-154, 161, 200 - 2h after b'fast: 77, 99-157, 181, 234 >> 99-137, 164 >> 106-167, 217 - before lunch: 150-160 >> 97-130 >> 90-111 >> 104-117, 169 - 2h after lunch: n/c >> 90-149 >> 101-161, 236, 218 >> 70-226 - before dinner: 110-140 >> 109-141 >> 81-159, 159 >> 87-218 (snack) - 2h after dinner: n/c >> 125-188 >> 96-171 >> 101-168 - bedtime: 74-110 >> 93-107 >> 88-139 >> 83-143 - nighttime: n/c >> 73 >> 51/62, 80-126 >> 129 Lowest sugar was 28 right after starting insulin >> 77 >> 51 (before stopping Glipizide at night) >> 70; he has hypoglycemia awareness in the 70s. Highest sugar was 280 (Txgiving) >> 234 >> 236 >> 226  Glucometer: ReliOn  Pt's meals are: - Breakfast: whole wheat toast + butter, 1x a weeK: eggs, cheese - Lunch: tuna salad or tuna sandwich ([employer] delivery), soft taco, meat + baked beans or potatoes - Dinner: [employer] delivery - Snacks: 1-0: nuts, bananas, other fruit He decreased his alcohol intake before last visit. He continues to swim 5 times a week.  -No CKD, last BUN/creatinine:  Lab Results   Component Value Date   BUN 17 07/23/2018   BUN 19 10/04/2017   CREATININE 0.86 07/23/2018   CREATININE 1.02 10/04/2017  On lisinopril 20 >> 10. -+ HL; last set of lipids: Lab Results  Component Value Date   CHOL 140 07/23/2018   HDL 32.40 (L) 07/23/2018   LDLCALC 68 07/23/2018   LDLDIRECT 77.0 07/07/2015   TRIG 198.0 (H) 07/23/2018   CHOLHDL 4 07/23/2018  On Lipitor 40 mg every other day and CoQ10 - last eye exam was 03/2018: No DR, + small cataract  -+ Numbness and tingling in his feet  He has a h/o arrhythmia (A fib) - wore an event monitor >> now on beta blocker. Cardiologist: Dr. Acie Fredrickson.  ROS: Constitutional: no weight gain/+ weight loss, no fatigue, no subjective hyperthermia, no subjective hypothermia Eyes: no blurry vision, no xerophthalmia ENT: no sore throat, no nodules palpated in neck, no dysphagia, no odynophagia, no hoarseness Cardiovascular: no CP/no SOB/no palpitations/no leg swelling Respiratory: no cough/no SOB/no wheezing Gastrointestinal: no N/no V/no D/no C/no acid reflux Musculoskeletal: no muscle aches/no joint aches Skin: no rashes, no hair loss Neurological: no tremors/+ numbness/+ tingling/no dizziness  I reviewed pt's medications, allergies, PMH, social hx, family hx, and changes were documented in the history of present illness. Otherwise, unchanged from my initial visit note.  Past Medical History:  Diagnosis Date  .  Diabetes mellitus type [name]   . HLD (hyperlipidemia)   . HTN (hypertension)   . Hx of colonic polyps   . Skin cancer    history of   Past Surgical History:  Procedure Laterality Date  . COLONOSCOPY    . PATELLAR TENDON REPAIR     Left  . Removal of excess tissue     Right and Left upper eyelids   Social History   Socioeconomic History  . Marital status: Married    Spouse name: Not on file  . Number of children: 2  Social Needs  Occupational History  . Occupation: DVD's  . Occupation: Cabin crew:  Self Employed, retired  Tobacco Use  . Smoking status: Former Smoker    Types: Cigars    Last attempt to quit: 02/22/2011  . Smokeless tobacco: Never Used  . Tobacco comment: 2 cigars monthly or less  Substance and Sexual Activity  . Alcohol use: Yes     0.0 oz    Vodka, 2-3 drinks 3x a week   Drugs: No   Current Outpatient Medications on File Prior to Visit  Medication Sig Dispense Refill  . apixaban (ELIQUIS) 5 MG TABS tablet Take 1 tablet (5 mg total) by mouth 2 (two) times daily. 60 tablet 11  . atorvastatin (LIPITOR) 80 MG tablet 1 tablet at bedtime Monday Wednesday Friday 90 tablet 3  . Coenzyme Q10 (COQ10 PO) Take 1 capsule by mouth daily.     . Dulaglutide (TRULICITY) 8.56 DJ/4.9FW SOPN Inject 1.5 mg weekly under skin (Patient taking differently: Inject 0.75 mg weekly under skin) 4 pen 5  . glipiZIDE (GLUCOTROL) 5 MG tablet TAKE 1 TABLET BY MOUTH  BEFORE B'fast (Patient taking differently: TAKE 1 TO 2 TABLET BY MOUTH  BEFORE B'fast.) 90 tablet 3  . glucose blood test strip 1 each by Other route daily. Use daily for glucose control - Dx 250.00 100 each 0  . hydrochlorothiazide (MICROZIDE) 12.5 MG capsule TAKE 1 CAPSULE BY MOUTH EVERY DAY 100 capsule 3  . lisinopril (PRINIVIL,ZESTRIL) 40 MG tablet 1 p.o. every morning 100 tablet 3  . metFORMIN (GLUCOPHAGE) 1000 MG tablet Take 1 tablet (1,000 mg total) by mouth 2 (two) times daily with a meal. 180 tablet 3  . propranolol (INDERAL) 10 MG tablet Take 1 tablet (10 mg total) by mouth 4 (four) times daily as needed (palpitations, irregular or fast heart rate). 90 tablet 3  . sildenafil (REVATIO) 20 MG tablet TAKE 1 TO 2 TABLETS 2 TO 3 HOURS BEFORE SEX 30 tablet 11   No current facility-administered medications on file prior to visit.    No Known Allergies Family History  Problem Relation Age of Onset  . Aortic aneurysm Unknown        family history  . Cancer Unknown        Breast (1st degree relative)  . Colon cancer Neg Hx      PE: BP 126/80   Pulse 77   Ht 6\' 1"  (1.854 m) Comment: measured  Wt 221 lb (100.2 kg)   SpO2 98%   BMI 29.16 kg/m  Wt Readings from Last 3 Encounters:  09/04/18 221 lb (100.2 kg)  07/23/18 226 lb 9.6 oz (102.8 kg)  07/12/18 225 lb (102.1 kg)   Constitutional: overweight, in NAD Eyes: PERRLA, EOMI, no exophthalmos ENT: moist mucous membranes, no thyromegaly, no cervical lymphadenopathy Cardiovascular: RRR, No MRG Respiratory: CTA B Gastrointestinal: abdomen soft, NT, ND, BS+ Musculoskeletal:  no deformities, strength intact in all 4 Skin: moist, warm, no rashes Neurological: no tremor with outstretched hands, DTR normal in all 4  ASSESSMENT: 1. DM2, insulin-dependent, uncontrolled, with complications - PN - ED  2. HL  3.  Overweight  PLAN:  1. Patient with long-standing, previously uncontrolled, type 2 diabetes, on oral antidiabetic regimen and GLP-1 receptor agonist.  He was previously on premixed insulin but changed to a GLP-1 receptor agonist with very good results.  He continues to be on low-dose Trulicity with good result in terms of weight and blood sugars.  We discussed about the possibility of stopping glipizide if the sugars remain controlled. - At this visit, sugars are still at or close to goal later in the day except for occasional hyperglycemic spikes.  Also, he may have sugars that are lower in the 70-90 range later in the day, but not in the morning.  In fact, sugars in the morning are slightly higher than goal most of the time.  This is despite him adding another glipizide tablet of 5 mg before dinner. - At this visit, we discussed about increasing Trulicity to 1.5 mg weekly and trying to stop glipizide.  I advised him to add glipizide back if needed if sugars remain high, especially around the holidays. - I suggested to:  Patient Instructions  Please stop Glipizide.  Please continue: - Metformin 1000 mg 2x a day with meals  Increase: - Trulicity to 1.5  mg weekly.  Please return in 3-4 months with your sugar log.   - today, HbA1c is 6.5% (slightly higher) - continue checking sugars at different times of the day - check 1x a day, rotating checks - advised for yearly eye exams >> he is UTD - Return to clinic in 4 mo with sugar log   2.  HL - Reviewed latest lipid panel from 12/2017: LDL at goal, high TG, HDL low Lab Results  Component Value Date   CHOL 140 07/23/2018   HDL 32.40 (L) 07/23/2018   LDLCALC 68 07/23/2018   LDLDIRECT 77.0 07/07/2015   TRIG 198.0 (H) 07/23/2018   CHOLHDL 4 07/23/2018  - Continues Lipitor + CoQ10 without side effects.  3.  Overweight - lost ~15 lbs after starting GLP1 R agonist!  He lost 6 more pounds since last visit. - Continue Trulicity which should also help with weight loss and we will also increase the dose at this visit.  We will try to stop glipizide, which is usually causing some weight gain.  [name], MD PhD [facility]

## 2018-09-18 ENCOUNTER — Ambulatory Visit: Payer: Medicare Other | Admitting: Adult Health

## 2018-10-03 ENCOUNTER — Telehealth: Payer: Self-pay | Admitting: *Deleted

## 2018-10-03 NOTE — Telephone Encounter (Signed)
Prior auth for Sildenafil citrate 20mg  sent to [email or web address]-key [id number].

## 2018-11-12 ENCOUNTER — Other Ambulatory Visit: Payer: Self-pay | Admitting: Cardiovascular Disease

## 2018-12-27 ENCOUNTER — Encounter: Payer: Self-pay | Admitting: Internal Medicine

## 2018-12-28 NOTE — Telephone Encounter (Signed)
Note in [email or web address] stated the request was denied.  Message sent to [facility].

## 2019-01-01 NOTE — Telephone Encounter (Signed)
Left message on machine patient will need to establish with new provider and insurance has denied Sildenafil.  CRM

## 2019-01-03 ENCOUNTER — Other Ambulatory Visit: Payer: Self-pay

## 2019-01-03 ENCOUNTER — Encounter: Payer: Self-pay | Admitting: Internal Medicine

## 2019-01-03 ENCOUNTER — Ambulatory Visit (INDEPENDENT_AMBULATORY_CARE_PROVIDER_SITE_OTHER): Payer: Medicare HMO | Admitting: Internal Medicine

## 2019-01-03 VITALS — BP 138/82 | HR 82 | Ht 73.5 in | Wt 228.0 lb

## 2019-01-03 DIAGNOSIS — E663 Overweight: Secondary | ICD-10-CM

## 2019-01-03 DIAGNOSIS — E785 Hyperlipidemia, unspecified: Secondary | ICD-10-CM

## 2019-01-03 DIAGNOSIS — E119 Type 2 diabetes mellitus without complications: Secondary | ICD-10-CM | POA: Diagnosis not present

## 2019-01-03 LAB — POCT GLYCOSYLATED HEMOGLOBIN (HGB A1C): HEMOGLOBIN A1C: 7.1 % — AB (ref 4.0–5.6)

## 2019-01-03 NOTE — Patient Instructions (Signed)
Please continue: - Trulicity 1.5 mg weekly.  Please move: - Metformin 2000 mg with dinner  Please take a Glipizide 5 mg before a large dinner.  Please return in 4 months with your sugar log.

## 2019-01-03 NOTE — Progress Notes (Signed)
Patient ID: KOBE OFALLON, male   DOB: 01-26-1951, 68 y.o.   MRN: [id number]   HPI: TAICHI REPKA II is a 68 y.o.-year-old male, returning for follow-up for  DM2, dx in 2000s, insulin-dependent since 2018, uncontrolled, with complications (PN, ED).  Last visit 4 months ago.  Last hemoglobin A1c was: Lab Results  Component Value Date   HGBA1C 6.5 (A) 09/04/2018   HGBA1C 6.2 (A) 04/30/2018   HGBA1C 6.6 01/29/2018   Pt is on a regimen of: - Metformin 1000 mg 2x a day with meals - Trulicity 1.5 mg weekly. We stopped Novolog 70/30 10 units 2x a day in 11/2017, when starting Trulicity.  Pt checks his sugars 1-4 times a day: - am: 119-142 >> 95-154, 161, 200 >> 141-187 - 2h after b'fast: 99-137, 164 >> 106-167, 217 >> 150 - before lunch: 90-111 >> 104-117, 169 >> 107, 140 - 2h after lunch: 101-161, 236, 218 >> 70-226 >> 93-161 - before dinner: 81-159, 159 >> 87-218 (snack) >> 131-169 - 2h after dinner: 96-171 >> 101-168 >> 111-208 - bedtime: 93-107 >> 88-139 >> 83-143 >> 113, 196 - nighttime: 73 >> 51/62, 80-126 >> 129 >> 91 (wine) Lowest sugar was 28 right after starting insulin >> 77 >> 51 (before stopping Glipizide at night) >> 70 >> 91; he has hypoglycemia awareness in the 70s. Highest sugar was 226 >> 208.  Glucometer: ReliOn  Pt's meals are: - Breakfast: whole wheat toast + butter, 1x a weeK: eggs, cheese - Lunch: tuna salad or tuna sandwich ([employer] delivery), soft taco, meat + baked beans or potatoes - Dinner: [employer] delivery - Snacks: 1-0: nuts, bananas, other fruit He decreased his alcohol intake before last visit He continues to swim 5 times a week.  -N no CKD, last BUN/creatinine:  Lab Results  Component Value Date   BUN 17 07/23/2018   BUN 19 10/04/2017   CREATININE 0.86 07/23/2018   CREATININE 1.02 10/04/2017  On lisinopril 10. -+ HL; last set of lipids: Lab Results  Component Value Date   CHOL 140 07/23/2018   HDL 32.40 (L) 07/23/2018   LDLCALC 68 07/23/2018   LDLDIRECT 77.0 07/07/2015   TRIG 198.0 (H) 07/23/2018   CHOLHDL 4 07/23/2018  On Lipitor 40 mg every other day + CoQ10. - last eye exam was 03/2018: No DR, + small cataract -+ Numbness and tingling in his feet  He has a h/o arrhythmia (A fib) - wore an event monitor >> now on beta blocker. Cardiologist: Dr. Acie Fredrickson.  ROS: Constitutional: + Weight gain/no weight loss, no fatigue, no subjective hyperthermia, no subjective hypothermia, + nocturia Eyes: no blurry vision, no xerophthalmia ENT: no sore throat, no nodules palpated in neck, no dysphagia, no odynophagia, no hoarseness Cardiovascular: no CP/no SOB/no palpitations/no leg swelling Respiratory: no cough/no SOB/no wheezing Gastrointestinal: no N/no V/no D/no C/no acid reflux Musculoskeletal: no muscle aches/no joint aches Skin: no rashes, no hair loss Neurological: no tremors/+ numbness/+ tingling/no dizziness  I reviewed pt's medications, allergies, PMH, social hx, family hx, and changes were documented in the history of present illness. Otherwise, unchanged from my initial visit note.  Past Medical History:  Diagnosis Date  . Diabetes mellitus type II   . HLD (hyperlipidemia)   . HTN (hypertension)   . Hx of colonic polyps   . Skin cancer    history of   Past Surgical History:  Procedure Laterality Date  . COLONOSCOPY    . PATELLAR TENDON REPAIR  Left  . Removal of excess tissue     Right and Left upper eyelids   Social History   Socioeconomic History  . Marital status: Married    Spouse name: Not on file  . Number of children: 2  Social Needs  Occupational History  . Occupation: DVD's  . Occupation: Cabin crew: Self Employed, retired  Tobacco Use  . Smoking status: Former Smoker    Types: Cigars    Last attempt to quit: 02/22/2011  . Smokeless tobacco: Never Used  . Tobacco comment: 2 cigars monthly or less  Substance and Sexual Activity  . Alcohol use: Yes      0.0 oz    Vodka, 2-3 drinks 3x a week   Drugs: No   Current Outpatient Medications on File Prior to Visit  Medication Sig Dispense Refill  . atorvastatin (LIPITOR) 80 MG tablet 1 tablet at bedtime Monday Wednesday Friday 90 tablet 3  . Coenzyme Q10 (COQ10 PO) Take 1 capsule by mouth daily.     . Dulaglutide (TRULICITY) 1.5 LZ/7.6BH SOPN Inject 1.5 mg into the skin once a week. 4 pen 11  . ELIQUIS 5 MG TABS tablet TAKE 1 TABLET BY MOUTH TWICE A DAY 60 tablet 6  . glucose blood test strip 1 each by Other route daily. Use daily for glucose control - Dx 250.00 100 each 0  . hydrochlorothiazide (MICROZIDE) 12.5 MG capsule TAKE 1 CAPSULE BY MOUTH EVERY DAY 100 capsule 3  . lisinopril (PRINIVIL,ZESTRIL) 40 MG tablet 1 p.o. every morning 100 tablet 3  . metFORMIN (GLUCOPHAGE) 1000 MG tablet Take 1 tablet (1,000 mg total) by mouth 2 (two) times daily with a meal. 180 tablet 3  . propranolol (INDERAL) 10 MG tablet Take 1 tablet (10 mg total) by mouth 4 (four) times daily as needed (palpitations, irregular or fast heart rate). 90 tablet 3  . sildenafil (REVATIO) 20 MG tablet TAKE 1 TO 2 TABLETS 2 TO 3 HOURS BEFORE SEX 30 tablet 11   No current facility-administered medications on file prior to visit.    No Known Allergies Family History  Problem Relation Age of Onset  . Aortic aneurysm Unknown        family history  . Cancer Unknown        Breast (1st degree relative)  . Colon cancer Neg Hx     PE: Vital signs: Pulse 82, blood pressure 138/82, oxygen saturation 97%, height 6 feet 1.5. Weight: 228 pounds Wt Readings from Last 3 Encounters:  09/04/18 221 lb (100.2 kg)  07/23/18 226 lb 9.6 oz (102.8 kg)  07/12/18 225 lb (102.1 kg)   Constitutional: overweight, in NAD Eyes: PERRLA, EOMI, no exophthalmos ENT: moist mucous membranes, no thyromegaly, no cervical lymphadenopathy Cardiovascular: RRR, No MRG Respiratory: CTA B Gastrointestinal: abdomen soft, NT, ND, BS+ Musculoskeletal: no  deformities, strength intact in all 4 Skin: moist, warm, no rashes Neurological: no tremor with outstretched hands, DTR normal in all 4  ASSESSMENT: 1. DM2, insulin-dependent, uncontrolled, with complications - PN - ED  2. HL  3.  Overweight  PLAN:  1. Patient with longstanding, previously uncontrolled, type 2 diabetes, on oral antidiabetic regimen with metformin and also GLP-1 receptor agonist.  At last visit, HbA1c was slightly higher, but still at goal, at 6.5%, and we increased Trulicity to 1.5 mg weekly while trying to stop his glipizide.  At that time, he was having slightly lower sugars in the 70-90 range later in the  day. -At this visit, sugars are higher, especially in the morning, but also with occasional spikes later in the day, after dinner -We will try to move the entire dose of metformin at night to hopefully improve the sugars in the morning.  We also discussed about taking the glipizide before a larger dinner, or a dinner that has more carbs/dessert. -However, he read the book program for reversing diabetes by Dr. Alyssa Grove and started to apply some of the concepts in the book.  I think this will greatly improve his insulin sensitivity and therefore his BG's. - I suggested to:  Patient Instructions  Please continue: - Trulicity 1.5 mg weekly.  Please move: - Metformin 2000 mg with dinner  Please take a Glipizide 5 mg before a large dinner.  Please return in 4 months with your sugar log.   - today, HbA1c is 7.1% (higher) - continue checking sugars at different times of the day - check 1x a day, rotating checks - advised for yearly eye exams >> he is UTD - Return to clinic in 4 mo with sugar log   2.  HL - Reviewed latest lipid panel from 06/2018: LDL at goal, triglycerides high, HDL low Lab Results  Component Value Date   CHOL 140 07/23/2018   HDL 32.40 (L) 07/23/2018   LDLCALC 68 07/23/2018   LDLDIRECT 77.0 07/07/2015   TRIG 198.0 (H) 07/23/2018    CHOLHDL 4 07/23/2018  - Continues Lipitor and co-Q10 without side effects.  3.  Overweight -He lost approximately 21 pounds after starting GLP-1 receptor agonist, but gained 7 lbs back since last OV -We will continue Trulicity which should continue to help with weight loss -Starting a plant-based diet will definitely help  [name], MD PhD [facility]

## 2019-01-09 DIAGNOSIS — H25811 Combined forms of age-related cataract, right eye: Secondary | ICD-10-CM | POA: Diagnosis not present

## 2019-01-09 DIAGNOSIS — H5711 Ocular pain, right eye: Secondary | ICD-10-CM | POA: Diagnosis not present

## 2019-01-09 DIAGNOSIS — S0501XA Injury of conjunctiva and corneal abrasion without foreign body, right eye, initial encounter: Secondary | ICD-10-CM | POA: Diagnosis not present

## 2019-01-09 DIAGNOSIS — H1031 Unspecified acute conjunctivitis, right eye: Secondary | ICD-10-CM | POA: Diagnosis not present

## 2019-01-11 ENCOUNTER — Encounter: Payer: Self-pay | Admitting: Internal Medicine

## 2019-01-24 ENCOUNTER — Ambulatory Visit: Payer: Medicare HMO | Admitting: Internal Medicine

## 2019-02-19 ENCOUNTER — Telehealth: Payer: Self-pay | Admitting: Physician Assistant

## 2019-02-19 NOTE — Telephone Encounter (Signed)
° °  VIDEO/MyChart App    Consent sent via MyChart   -  02/19/2019

## 2019-02-20 ENCOUNTER — Other Ambulatory Visit: Payer: Self-pay

## 2019-02-20 ENCOUNTER — Telehealth (INDEPENDENT_AMBULATORY_CARE_PROVIDER_SITE_OTHER): Payer: Medicare HMO | Admitting: Physician Assistant

## 2019-02-20 ENCOUNTER — Encounter: Payer: Self-pay | Admitting: Physician Assistant

## 2019-02-20 VITALS — BP 125/82 | HR 78 | Ht 73.5 in | Wt 218.0 lb

## 2019-02-20 DIAGNOSIS — Z7189 Other specified counseling: Secondary | ICD-10-CM | POA: Diagnosis not present

## 2019-02-20 DIAGNOSIS — I480 Paroxysmal atrial fibrillation: Secondary | ICD-10-CM | POA: Diagnosis not present

## 2019-02-20 DIAGNOSIS — I10 Essential (primary) hypertension: Secondary | ICD-10-CM | POA: Diagnosis not present

## 2019-02-20 NOTE — Progress Notes (Signed)
Virtual Visit via Video Note   This visit type was conducted due to national recommendations for restrictions regarding the COVID-19 Pandemic (e.g. social distancing) in an effort to limit this patient's exposure and mitigate transmission in our community.  Due to his co-morbid illnesses, this patient is at least at moderate risk for complications without adequate follow up.  This format is felt to be most appropriate for this patient at this time.  All issues noted in this document were discussed and addressed.  A limited physical exam was performed with this format.  Please refer to the patient's chart for his consent to telehealth for [facility].   Evaluation Performed:  Follow-up visit  Date:  02/20/2019   ID:  Robert [name], Robert [name] Jan 29, 1951, MRN [id number]  Patient Location: Home Provider Location: Home  PCP:  No primary care provider on file.  Cardiologist:  Mertie Moores, MD   Electrophysiologist:  None  Vascular surgeon: Dr. Oneida Alar  Chief Complaint  Patient presents with   Follow-up    Atrial fibrillation     History of Present Illness:    Robert [name] is a 68 y.o. male with paroxysmal atrial fibrillation, hypertension, hyperlipidemia, diabetes, peripheral arterial disease.  He is on Apixaban for anticoagulation.  He was last seen by Dr. Acie Fredrickson in June 2019.  CHADS2-VASc=4 (age x 1, vascular dz, HTN, DM).  Today, he is doing well.  He denies chest pain, shortness of breath, syncope, orthopnea, PND or lower extremity swelling.  He did have an episode a few weeks ago where he came in from working outside and noticed that his pulse was rapid.  He started checking his blood pressure.  This was elevated up to 152/98.  His heart rate was in the 140s.  This went on for about an hour.  He took a dose of propranolol.  He really did not experience any palpitations with this.  He has not had any recurrence of symptoms.  He remains very active without exertional  chest symptoms or symptoms that would suggest angina.  Of note, he has been following a vegan diet recently.   The patient does not have symptoms concerning for COVID-19 infection (fever, chills, cough, or new shortness of breath).    Past Medical History:  Diagnosis Date   Diabetes mellitus type [name]    HLD (hyperlipidemia)    HTN (hypertension)    Hx of colonic polyps    Skin cancer    history of   Past Surgical History:  Procedure Laterality Date   COLONOSCOPY     PATELLAR TENDON REPAIR     Left   Removal of excess tissue     Right and Left upper eyelids     Current Meds  Medication Sig   atorvastatin (LIPITOR) 40 MG tablet Take 40 mg by mouth daily.   Coenzyme Q10 (COQ10 PO) Take 1 capsule by mouth daily.    Dulaglutide (TRULICITY) 1.5 SL/3.7DS SOPN Inject 1.5 mg into the skin once a week.   ELIQUIS 5 MG TABS tablet TAKE 1 TABLET BY MOUTH TWICE A DAY   glucose blood test strip 1 each by Other route daily. Use daily for glucose control - Dx 250.00   hydrochlorothiazide (MICROZIDE) 12.5 MG capsule TAKE 1 CAPSULE BY MOUTH EVERY DAY   lisinopril (PRINIVIL,ZESTRIL) 40 MG tablet 1 p.o. every morning   metFORMIN (GLUCOPHAGE) 1000 MG tablet Take 1 tablet (1,000 mg total) by mouth 2 (two) times daily with a meal.  propranolol (INDERAL) 10 MG tablet Take 1 tablet (10 mg total) by mouth 4 (four) times daily as needed (palpitations, irregular or fast heart rate).   sildenafil (REVATIO) 20 MG tablet TAKE 1 TO 2 TABLETS 2 TO 3 HOURS BEFORE SEX     Allergies:   Patient has no known allergies.   Social History   Tobacco Use   Smoking status: Former Smoker    Types: Cigars    Last attempt to quit: 02/22/2011    Years since quitting: 8.0   Smokeless tobacco: Never Used   Tobacco comment: 2 cigars monthly or less  Substance Use Topics   Alcohol use: Yes    Alcohol/week: 0.0 standard drinks    Comment: 6-8oz /wk   Drug use: No     Family Hx: The patient's  family history includes Aortic aneurysm in his unknown relative; Cancer in his unknown relative. There is no history of Colon cancer.  ROS:   Please see the history of present illness.    He denies melena, hematochezia, hematuria. All other systems reviewed and are negative.   Prior CV studies:   The following studies were reviewed today:  Echo 11/16/2017 Severe concentric LVH, EF 60-65, normal wall motion, grade 1 diastolic dysfunction, aortic root 40 mm, ascending aorta 38 mm, MAC, trivial PI  Event monitor 10/11/2017 1. NSR with sinus bradycardia 2. Frequent PAC's and PVC's 3. PAF with a CVR/RVR is present 4. No prolonged pauses or bradycardia    Labs/Other Tests and Data Reviewed:    EKG:  An ECG dated 07/23/2018 was personally reviewed today and demonstrated:  Sinus rhythm, heart rate 74, left axis deviation, T wave inversions V4-V5, QTC 402, no change from prior tracing  Recent Labs: 07/23/2018: ALT 12; BUN 17; Creatinine, Ser 0.86; Hemoglobin 15.4; Platelets 212.0; Potassium 4.3; Sodium 139; TSH 1.25   Recent Lipid Panel Lab Results  Component Value Date/Time   CHOL 140 07/23/2018 12:20 PM   TRIG 198.0 (H) 07/23/2018 12:20 PM   HDL 32.40 (L) 07/23/2018 12:20 PM   CHOLHDL 4 07/23/2018 12:20 PM   LDLCALC 68 07/23/2018 12:20 PM   LDLDIRECT 77.0 07/07/2015 09:30 AM    Wt Readings from Last 3 Encounters:  02/20/19 218 lb (98.9 kg)  01/03/19 228 lb (103.4 kg)  09/04/18 221 lb (100.2 kg)     Objective:    Vital Signs:  BP 125/82    Pulse 78    Ht 6' 1.5" (1.867 m)    Wt 218 lb (98.9 kg)    BMI 28.37 kg/m    VITAL SIGNS:  reviewed GEN:  no acute distress EYES:  sclerae anicteric, EOMI - Extraocular Movements Intact RESPIRATORY:  Normal respiratory effort NEURO:  Alert and oriented PSYCH:  normal affect  ASSESSMENT & PLAN:    PAF (paroxysmal atrial fibrillation) ([place]) He had a recent episode of rapid heart rates which was probably recurrent atrial fibrillation.   He has not had any further symptoms since then.  I have asked him to contact us if he starts to notice more more of these.  We can certainly place him on a daily beta-blocker at that point (such as metoprolol succinate) and obtain an event monitor to document recurrent atrial fibrillation.  He is tolerating anticoagulation well.  Recent hemoglobin and creatinine were normal.  Continue current therapy.  Essential hypertension The patient's blood pressure is controlled on his current regimen.  Continue current therapy.   COVID-19 Education: The signs and symptoms of COVID-19  were discussed with the patient and how to seek care for testing (follow up with PCP or arrange E-visit).  The importance of social distancing was discussed today.  Time:   Today, I have spent 18 minutes with the patient with telehealth technology discussing the above problems.     Medication Adjustments/Labs and Tests Ordered: Current medicines are reviewed at length with the patient today.  Concerns regarding medicines are outlined above.   Tests Ordered: No orders of the defined types were placed in this encounter.   Medication Changes: No orders of the defined types were placed in this encounter.   Disposition:  Follow up in 1 year(s)  Signed, Richardson Dopp, PA-C  02/20/2019 3:06 PM    [facility]

## 2019-02-20 NOTE — Patient Instructions (Addendum)
Medication Instructions:  No changes  If you need a refill on your cardiac medications before your next appointment, please call your pharmacy.   Lab work: None  If you have labs (blood work) drawn today and your tests are completely normal, you will receive your results only by: [name] Kitchen MyChart Message (if you have MyChart) OR . A paper copy in the mail If you have any lab test that is abnormal or we need to change your treatment, we will call you to review the results.  Testing/Procedures: None  Follow-Up: At [facility], you and your health needs are our priority.  As part of our continuing mission to provide you with exceptional heart care, we have created designated Provider Care Teams.  These Care Teams include your primary Cardiologist (physician) and Advanced Practice Providers (APPs -  Physician Assistants and Nurse Practitioners) who all work together to provide you with the care you need, when you need it. You will need a follow up appointment in:  1 year.  Please call our office 2 months in advance to schedule this appointment.  You may see Mertie Moores, MD or Richardson Dopp, PA-C   Any Other Special Instructions Will Be Listed Below (If Applicable).  Call if you have recurrent rapid heartbeats so that we can arrange a heart monitor and adjust your medications.

## 2019-03-01 NOTE — Telephone Encounter (Unsigned)
Copied from [place] [phone]. Topic: General - Other >> Mar 01, 2019  3:44 PM Mcneil, Ja-Kwan wrote: Reason for CRM: Pt called in stating he previously was a pt of Dr. Sherren Mocha and he would like to speak with Apolonio Schneiders. Pt requests a call back from [place]. Cb# [phone]

## 2019-03-11 ENCOUNTER — Encounter: Payer: Self-pay | Admitting: General Surgery

## 2019-03-12 ENCOUNTER — Other Ambulatory Visit: Payer: Self-pay

## 2019-03-12 ENCOUNTER — Ambulatory Visit (INDEPENDENT_AMBULATORY_CARE_PROVIDER_SITE_OTHER): Payer: Medicare HMO | Admitting: Internal Medicine

## 2019-03-12 ENCOUNTER — Encounter: Payer: Self-pay | Admitting: Internal Medicine

## 2019-03-12 DIAGNOSIS — Z7901 Long term (current) use of anticoagulants: Secondary | ICD-10-CM | POA: Insufficient documentation

## 2019-03-12 DIAGNOSIS — I480 Paroxysmal atrial fibrillation: Secondary | ICD-10-CM | POA: Diagnosis not present

## 2019-03-12 DIAGNOSIS — Z86010 Personal history of colonic polyps: Secondary | ICD-10-CM | POA: Diagnosis not present

## 2019-03-12 HISTORY — DX: Paroxysmal atrial fibrillation: I48.0

## 2019-03-12 NOTE — Progress Notes (Signed)
TELEHEALTH ENCOUNTER IN SETTING OF COVID-19 PANDEMIC - REQUESTED BY PATIENT SERVICE PROVIDED BY TELEMEDECINE - TYPE: A/V, Zoom  PATIENT LOCATION: Home PATIENT HAS CONSENTED TO TELEHEALTH VISIT PROVIDER LOCATION: OFFICE REFERRING PROVIDER none PARTICIPANTS OTHER THAN PATIENT: None  TIME SPENT ON CALL: 12 minutes   Robert [name] 68 y.o. 1951-07-10 [id number]  Assessment & Plan:   Encounter Diagnoses  Name Primary?  [name] Kitchen Hx of colonic polyps   . Long term current use of anticoagulant -Eliquis   . Paroxysmal atrial fibrillation (HCC)     Hx of colonic polyps Repeat colonoscopy is appropriate at this time. The risks and benefits as well as alternatives of endoscopic procedure(s) have been discussed and reviewed. All questions answered. The patient agrees to proceed. Rare but real risk of stroke off Eliquis reviewed as was risk of Cronavirus (Covid-19) infection.   I appreciate the opportunity to care for this patient.    Subjective:   Chief Complaint: History of colon polyps, the patient takes an anticoagulant  HPI  The patient has a history of adenomatous polyps and is on Eliquis for atrial fibrillation.  Last colonoscopy in 2016 he had 4 polyps 1 of the 1-2 were adenomas with maximum 15 mm he had adenomas in 2010 and 2005 as well. Started vegan diet recently though every 2 weeks he will treat himself to a cheeseburger or bologna sandwich.  Wt Readings from Last 3 Encounters:  03/12/19 218 lb (98.9 kg)  03/11/19 218 lb (98.9 kg)  02/20/19 218 lb (98.9 kg)   He used to see Christie Nottingham, he has not established primary care as of yet.  He is following with cardiology and has seen Dr. Cruzita Lederer of endocrinology. No Known Allergies Current Meds  Medication Sig  . atorvastatin (LIPITOR) 40 MG tablet Take 40 mg by mouth daily.  . Coenzyme Q10 (COQ10 PO) Take 1 capsule by mouth daily.   . Dulaglutide (TRULICITY) 1.5 SN/0.5LZ SOPN Inject 1.5 mg into the skin once a week.   [name] Kitchen ELIQUIS 5 MG TABS tablet TAKE 1 TABLET BY MOUTH TWICE A DAY  . glucose blood test strip 1 each by Other route daily. Use daily for glucose control - Dx 250.00  . hydrochlorothiazide (MICROZIDE) 12.5 MG capsule TAKE 1 CAPSULE BY MOUTH EVERY DAY  . lisinopril (PRINIVIL,ZESTRIL) 40 MG tablet 1 p.o. every morning  . metFORMIN (GLUCOPHAGE) 1000 MG tablet Take 1 tablet (1,000 mg total) by mouth 2 (two) times daily with a meal.  . propranolol (INDERAL) 10 MG tablet Take 1 tablet (10 mg total) by mouth 4 (four) times daily as needed (palpitations, irregular or fast heart rate).  . sildenafil (REVATIO) 20 MG tablet TAKE 1 TO 2 TABLETS 2 TO 3 HOURS BEFORE SEX   Past Medical History:  Diagnosis Date  . Diabetes mellitus type [name]   . HLD (hyperlipidemia)   . HTN (hypertension)   . Hx of colonic polyps   . Paroxysmal atrial fibrillation ([place]) 03/12/2019  . Skin cancer    history of   Past Surgical History:  Procedure Laterality Date  . COLONOSCOPY    . PATELLAR TENDON REPAIR     Left  . Removal of excess tissue     Right and Left upper eyelids   Social History   Social History Narrative   Patient is married he has been employed in [employer], [employer]   Former smoker with occasional alcohol use   family history includes Aortic aneurysm in an other  family member; Cancer in an other family member; Pancreatic cancer (age of onset: 82) in his mother.   Review of Systems occ neck pain and back pain otherwise negative

## 2019-03-12 NOTE — Patient Instructions (Addendum)
Good to see you via Zoom today.  Great work on [employer].  You will be scheduled for a colonoscopy due to history ogf colon polyps. You will need to hold Eliquis for 2 days prior to the colonoscopy. We will check with Dr. Acie Fredrickson about that.  I appreciate the opportunity to care for you. Silvano Rusk, MD, [facility]

## 2019-03-12 NOTE — Assessment & Plan Note (Addendum)
Repeat colonoscopy is appropriate at this time. The risks and benefits as well as alternatives of endoscopic procedure(s) have been discussed and reviewed. All questions answered. The patient agrees to proceed. Rare but real risk of stroke off Eliquis reviewed as was risk of Cronavirus (Covid-19) infection.

## 2019-03-13 ENCOUNTER — Telehealth: Payer: Self-pay

## 2019-03-13 NOTE — Telephone Encounter (Signed)
   Primary Cardiologist: Mertie Moores, MD  Chart reviewed as part of pre-operative protocol coverage.   Lovina Reach II was last seen on 02/20/19 by Richardson Dopp PAC.    Per our pharmacy staff: Patient with diagnosis of Afib on Eliquis for anticoagulation.    Procedure: Colonoscopy Date of procedure: 03/28/2019  CHADS2-VASc score of  4 (CHF, HTN, AGE, DM2, stroke/tia x 2, CAD, AGE, male)  CrCl 14ml/min  Per office protocol, patient can hold Eliquis for 2 days prior to procedure.    I will route this recommendation to the requesting party via Epic fax function and remove from pre-op pool.  Please call with questions.  [place], PA 03/13/2019, 2:25 PM

## 2019-03-13 NOTE — Telephone Encounter (Signed)
Patient with diagnosis of Afib on Eliquis for anticoagulation.    Procedure: Colonoscopy Date of procedure: 03/28/2019  CHADS2-VASc score of  4 (CHF, HTN, AGE, DM2, stroke/tia x 2, CAD, AGE, male)  CrCl 177ml/min  Per office protocol, patient can hold Eliquis for 2 days prior to procedure.

## 2019-03-13 NOTE — Telephone Encounter (Signed)
Patient informed to hold his Eliquis 2 days prior to his procedure and verbalized understanding. He will call once he gets his instructions with any questions he has.

## 2019-03-13 NOTE — Telephone Encounter (Signed)
[facility] Pre-operative Risk Assessment     Request for surgical clearance:     Endoscopy Procedure  What type of surgery is being performed?     colonoscopy  When is this surgery scheduled?     03/28/2019  What type of clearance is required ?   Pharmacy  Are there any medications that need to be held prior to surgery and how long? Eliquis for 2 days  Practice name and name of physician performing surgery?      [facility]  What is your office phone and fax number?      Phone- [phone]  [phone]  Anesthesia type (None, local, MAC, general) ?       MAC

## 2019-03-26 ENCOUNTER — Ambulatory Visit (INDEPENDENT_AMBULATORY_CARE_PROVIDER_SITE_OTHER): Payer: Medicare HMO | Admitting: Internal Medicine

## 2019-03-26 ENCOUNTER — Other Ambulatory Visit: Payer: Self-pay

## 2019-03-26 ENCOUNTER — Telehealth: Payer: Self-pay | Admitting: *Deleted

## 2019-03-26 DIAGNOSIS — I480 Paroxysmal atrial fibrillation: Secondary | ICD-10-CM | POA: Diagnosis not present

## 2019-03-26 DIAGNOSIS — I10 Essential (primary) hypertension: Secondary | ICD-10-CM | POA: Diagnosis not present

## 2019-03-26 DIAGNOSIS — E785 Hyperlipidemia, unspecified: Secondary | ICD-10-CM

## 2019-03-26 DIAGNOSIS — E119 Type 2 diabetes mellitus without complications: Secondary | ICD-10-CM

## 2019-03-26 MED ORDER — ATORVASTATIN CALCIUM 40 MG PO TABS: 40.0000 mg | ORAL_TABLET | Freq: Every day | ORAL | 1 refills | Status: DC

## 2019-03-26 NOTE — Telephone Encounter (Signed)
Covid-19 screening questions  Have you traveled in the last 14 days?no If yes where?  Do you now or have you had a fever in the last 14 days?no  Do you have any respiratory symptoms of shortness of breath or cough now or in the last 14 days?no  Do you have any family members or close contacts with diagnosed or suspected Covid-19 in the past 14 days?no  Have you been tested for Covid-19 and found to be positive?no  Pt made aware of care partner policy and will bring a mask with him if he has one available. SM

## 2019-03-26 NOTE — Progress Notes (Signed)
Virtual Visit via Video Note  I connected with Robert [name] on 03/26/19 at  1:30 PM EDT by a video enabled telemedicine application and verified that I am speaking with the correct person using two identifiers.  Location patient: home Location provider: work office Persons participating in the virtual visit: patient, provider  I discussed the limitations of evaluation and management by telemedicine and the availability of in person appointments. The patient expressed understanding and agreed to proceed.   HPI: This is a scheduled visit to establish care, for medication refills and also to follow-up on chronic conditions.  His past medical history significant for:  1.  Hyperlipidemia on a statin of which he needs refills today.  2.  Hypertension that has been well controlled.  3.  Type 2 diabetes followed by endocrinology on Trulicity, metformin and glipizide.  4.  Atrial fibrillation anticoagulated on Eliquis followed by Dr. Acie Fredrickson.  He is scheduled for colonoscopy later this week and his Eliquis is now on hold.  He is retired, he is a former smoker of about a pack a day but quit 5 years ago, he drinks about 2 glasses of wine every other day.  He has no acute complaints today.   ROS: Constitutional: Denies fever, chills, diaphoresis, appetite change and fatigue.  HEENT: Denies photophobia, eye pain, redness, hearing loss, ear pain, congestion, sore throat, rhinorrhea, sneezing, mouth sores, trouble swallowing, neck pain, neck stiffness and tinnitus.   Respiratory: Denies SOB, DOE, cough, chest tightness,  and wheezing.   Cardiovascular: Denies chest pain, palpitations and leg swelling.  Gastrointestinal: Denies nausea, vomiting, abdominal pain, diarrhea, constipation, blood in stool and abdominal distention.  Genitourinary: Denies dysuria, urgency, frequency, hematuria, flank pain and difficulty urinating.  Endocrine: Denies: hot or cold intolerance, sweats, changes  in hair or nails, polyuria, polydipsia. Musculoskeletal: Denies myalgias, back pain, joint swelling, arthralgias and gait problem.  Skin: Denies pallor, rash and wound.  Neurological: Denies dizziness, seizures, syncope, weakness, light-headedness, numbness and headaches.  Hematological: Denies adenopathy. Easy bruising, personal or family bleeding history  Psychiatric/Behavioral: Denies suicidal ideation, mood changes, confusion, nervousness, sleep disturbance and agitation   Past Medical History:  Diagnosis Date  . Diabetes mellitus type [name]   . HLD (hyperlipidemia)   . HTN (hypertension)   . Hx of colonic polyps   . Paroxysmal atrial fibrillation ([place]) 03/12/2019  . Skin cancer    history of    Past Surgical History:  Procedure Laterality Date  . COLONOSCOPY    . PATELLAR TENDON REPAIR     Left  . Removal of excess tissue     Right and Left upper eyelids    Family History  Problem Relation Age of Onset  . Aortic aneurysm Other        family history  . Cancer Other        Breast (1st degree relative)  . Pancreatic cancer Mother 35  . Colon cancer Neg Hx   . Rectal cancer Neg Hx   . Stomach cancer Neg Hx     SOCIAL HX:   reports that he quit smoking about 8 years ago. His smoking use included cigars. He has never used smokeless tobacco. He reports current alcohol use. He reports that he does not use drugs.   Current Outpatient Medications:  .  atorvastatin (LIPITOR) 40 MG tablet, Take 1 tablet (40 mg total) by mouth daily., Disp: 90 tablet, Rfl: 1 .  Coenzyme Q10 (COQ10 PO), Take 1 capsule by  mouth daily. , Disp: , Rfl:  .  Dulaglutide (TRULICITY) 1.5 FA/2.1HY SOPN, Inject 1.5 mg into the skin once a week., Disp: 4 pen, Rfl: 11 .  ELIQUIS 5 MG TABS tablet, TAKE 1 TABLET BY MOUTH TWICE A DAY, Disp: 60 tablet, Rfl: 6 .  glucose blood test strip, 1 each by Other route daily. Use daily for glucose control - Dx 250.00, Disp: 100 each, Rfl: 0 .  hydrochlorothiazide  (MICROZIDE) 12.5 MG capsule, TAKE 1 CAPSULE BY MOUTH EVERY DAY, Disp: 100 capsule, Rfl: 3 .  lisinopril (PRINIVIL,ZESTRIL) 40 MG tablet, 1 p.o. every morning, Disp: 100 tablet, Rfl: 3 .  metFORMIN (GLUCOPHAGE) 1000 MG tablet, Take 1 tablet (1,000 mg total) by mouth 2 (two) times daily with a meal., Disp: 180 tablet, Rfl: 3 .  propranolol (INDERAL) 10 MG tablet, Take 1 tablet (10 mg total) by mouth 4 (four) times daily as needed (palpitations, irregular or fast heart rate)., Disp: 90 tablet, Rfl: 3 .  sildenafil (REVATIO) 20 MG tablet, TAKE 1 TO 2 TABLETS 2 TO 3 HOURS BEFORE SEX, Disp: 30 tablet, Rfl: 11  EXAM:   VITALS per patient if applicable: Blood pressure 138/75, weight 218  GENERAL: alert, oriented, appears well and in no acute distress  HEENT: atraumatic, conjunttiva clear, no obvious abnormalities on inspection of external nose and ears  NECK: normal movements of the head and neck  LUNGS: on inspection no signs of respiratory distress, breathing rate appears normal, no obvious gross increased work of breathing, gasping or wheezing  CV: no obvious cyanosis  MS: moves all visible extremities without noticeable abnormality  PSYCH/NEURO: pleasant and cooperative, no obvious depression or anxiety, speech and thought processing grossly intact  ASSESSMENT AND PLAN:   Hyperlipidemia, unspecified hyperlipidemia type -Refill statin today. -Last LDL was 68 in September 2019.  Essential hypertension -Well-controlled, continue current meds  Type 2 diabetes mellitus without complication, without long-term current use of insulin ([place]) -Most recent A1c was 7.1 in March 2020, this is followed by endocrinology.  Paroxysmal atrial fibrillation (HCC) -Anticoagulated on Eliquis.     I discussed the assessment and treatment plan with the patient. The patient was provided an opportunity to ask questions and all were answered. The patient agreed with the plan and demonstrated an  understanding of the instructions.   The patient was advised to call back or seek an in-person evaluation if the symptoms worsen or if the condition fails to improve as anticipated.    Lelon Frohlich, MD  [facility]

## 2019-03-28 ENCOUNTER — Encounter: Payer: Self-pay | Admitting: Internal Medicine

## 2019-03-28 ENCOUNTER — Other Ambulatory Visit: Payer: Self-pay

## 2019-03-28 ENCOUNTER — Encounter: Payer: Medicare HMO | Admitting: Internal Medicine

## 2019-03-28 ENCOUNTER — Ambulatory Visit (AMBULATORY_SURGERY_CENTER): Payer: Medicare HMO | Admitting: Internal Medicine

## 2019-03-28 VITALS — BP 120/70 | HR 65 | Temp 98.3°F | Resp 13 | Ht 75.0 in | Wt 218.0 lb

## 2019-03-28 DIAGNOSIS — Z86010 Personal history of colonic polyps: Secondary | ICD-10-CM

## 2019-03-28 DIAGNOSIS — D124 Benign neoplasm of descending colon: Secondary | ICD-10-CM

## 2019-03-28 DIAGNOSIS — D125 Benign neoplasm of sigmoid colon: Secondary | ICD-10-CM

## 2019-03-28 MED ORDER — SODIUM CHLORIDE 0.9 % IV SOLN: 500.0000 mL | Freq: Once | INTRAVENOUS | Status: DC

## 2019-03-28 NOTE — Patient Instructions (Addendum)
3 polyps removed today. One was medium-large.  Restart Eliquis tomorrow.  I will let you know pathology results and when to have another routine colonoscopy by mail and/or My Chart.  I appreciate the opportunity to care for you. Gatha Mayer, MD, FACG   YOU HAD AN ENDOSCOPIC PROCEDURE TODAY AT [facility]:   Refer to the procedure report that was given to you for any specific questions about what was found during the examination.  If the procedure report does not answer your questions, please call your gastroenterologist to clarify.  If you requested that your care partner not be given the details of your procedure findings, then the procedure report has been included in a sealed envelope for you to review at your convenience later.  YOU SHOULD EXPECT: Some feelings of bloating in the abdomen. Passage of more gas than usual.  Walking can help get rid of the air that was put into your GI tract during the procedure and reduce the bloating. If you had a lower endoscopy (such as a colonoscopy or flexible sigmoidoscopy) you may notice spotting of blood in your stool or on the toilet paper. If you underwent a bowel prep for your procedure, you may not have a normal bowel movement for a few days.  Please Note:  You might notice some irritation and congestion in your nose or some drainage.  This is from the oxygen used during your procedure.  There is no need for concern and it should clear up in a day or so.  SYMPTOMS TO REPORT IMMEDIATELY:   Following lower endoscopy (colonoscopy or flexible sigmoidoscopy):  Excessive amounts of blood in the stool  Significant tenderness or worsening of abdominal pains  Swelling of the abdomen that is new, acute  Fever of 100F or higher  For urgent or emergent issues, a gastroenterologist can be reached at any hour by calling [phone].   DIET:  We do recommend a small meal at first, but then you may proceed to your regular  diet.  Drink plenty of fluids but you should avoid alcoholic beverages for 24 hours.  ACTIVITY:  You should plan to take it easy for the rest of today and you should NOT DRIVE or use heavy machinery until tomorrow (because of the sedation medicines used during the test).    FOLLOW UP: Our staff will call the number listed on your records 48-72 hours following your procedure to check on you and address any questions or concerns that you may have regarding the information given to you following your procedure. If we do not reach you, we will leave a message.  We will attempt to reach you two times.  During this call, we will ask if you have developed any symptoms of COVID 19. If you develop any symptoms (ie: fever, flu-like symptoms, shortness of breath, cough etc.) before then, please call [phone].  If you test positive for Covid 19 in the 2 weeks post procedure, please call and report this information to [place].    If any biopsies were taken you will be contacted by phone or by letter within the next 1-3 weeks.  Please call us at [phone] if you have not heard about the biopsies in 3 weeks.    SIGNATURES/CONFIDENTIALITY: You and/or your care partner have signed paperwork which will be entered into your electronic medical record.  These signatures attest to the fact that that the information above on your After Visit Summary has been  reviewed and is understood.  Full responsibility of the confidentiality of this discharge information lies with you and/or your care-partner.

## 2019-03-28 NOTE — Progress Notes (Signed)
To PACU, VSS. Report to [email or web address] 

## 2019-03-28 NOTE — Progress Notes (Signed)
Called to room to assist during endoscopic procedure.  Patient ID and intended procedure confirmed with present staff. Received instructions for my participation in the procedure from the performing physician.  

## 2019-03-28 NOTE — Op Note (Signed)
[place] Patient Name: Robert [name] Procedure Date: 03/28/2019 11:04 AM MRN: [id number] Endoscopist: Gatha Mayer , MD Age: 68 Referring MD:  Date of Birth: 04-May-1951 Gender: Male Account #: [id number] Procedure:                Colonoscopy Indications:              Surveillance: Personal history of adenomatous                            polyps on last colonoscopy > 3 years ago Medicines:                Propofol per Anesthesia, Monitored Anesthesia Care Procedure:                Pre-Anesthesia Assessment:                           - Prior to the procedure, a History and Physical                            was performed, and patient medications and                            allergies were reviewed. The patient's tolerance of                            previous anesthesia was also reviewed. The risks                            and benefits of the procedure and the sedation                            options and risks were discussed with the patient.                            All questions were answered, and informed consent                            was obtained. Prior Anticoagulants: The patient                            last took Eliquis (apixaban) 2 days prior to the                            procedure. ASA Grade Assessment: III - A patient                            with severe systemic disease. After reviewing the                            risks and benefits, the patient was deemed in                            satisfactory condition to undergo the procedure.  After obtaining informed consent, the colonoscope                            was passed under direct vision. Throughout the                            procedure, the patient's blood pressure, pulse, and                            oxygen saturations were monitored continuously. The                            Colonoscope was introduced through the anus and       advanced to the the cecum, identified by                            appendiceal orifice and ileocecal valve. The                            colonoscopy was performed without difficulty. The                            patient tolerated the procedure well. The quality                            of the bowel preparation was excellent. The                            ileocecal valve, appendiceal orifice, and rectum                            were photographed. The bowel preparation used was                            Miralax via split dose instruction. Scope In: 11:10:13 AM Scope Out: 11:39:46 AM Scope Withdrawal Time: 0 hours 25 minutes 52 seconds  Total Procedure Duration: 0 hours 29 minutes 33 seconds  Findings:                 The perianal and digital rectal examinations were                            normal. Pertinent negatives include normal prostate                            (size, shape, and consistency).                           A 20 mm polyp was found in the descending colon.                            The polyp was sessile. The polyp was removed with a  piecemeal technique using a hot snare. Resection                            and retrieval were complete. Verification of                            patient identification for the specimen was done.                            Estimated blood loss: none. To prevent bleeding                            after the polypectomy, three hemostatic clips were                            successfully placed (MR conditional). There was no                            bleeding during, or at the end, of the procedure.                           Two sessile polyps were found in the sigmoid colon.                            The polyps were diminutive in size. These polyps                            were removed with a cold snare. Resection and                            retrieval were complete. Verification of patient                             identification for the specimen was done. Estimated                            blood loss was minimal.                           Multiple diverticula were found in the sigmoid                            colon.                           The exam was otherwise without abnormality on                            direct and retroflexion views. Complications:            No immediate complications. Estimated Blood Loss:     Estimated blood loss was minimal. Impression:               - One 20 mm polyp in the descending colon, removed  piecemeal using a hot snare. Resected and                            retrieved. Clips (MR conditional) were placed. 2                            clips did not attach (5 used)                           - Two diminutive polyps in the sigmoid colon,                            removed with a cold snare. Resected and retrieved.                           - Diverticulosis in the sigmoid colon.                           - The examination was otherwise normal on direct                            and retroflexion views.                           - Personal history of colonic polyps. Adenomas                            (some advanced) since 2005 Recommendation:           - Patient has a contact number available for                            emergencies. The signs and symptoms of potential                            delayed complications were discussed with the                            patient. Return to normal activities tomorrow.                            Written discharge instructions were provided to the                            patient.                           - Resume previous diet.                           - Continue present medications.                           - Resume Eliquis (apixaban) at prior dose tomorrow.                           -  Repeat colonoscopy is recommended for                             surveillance. The colonoscopy date will be                            determined after pathology results from today's                            exam become available for review. Gatha Mayer, MD 03/28/2019 11:54:58 AM This report has been signed electronically.

## 2019-04-01 ENCOUNTER — Telehealth: Payer: Self-pay

## 2019-04-01 NOTE — Telephone Encounter (Signed)
  Follow up Call-  Call back number 03/28/2019  Post procedure Call Back phone  # [phone]  Permission to leave phone message Yes  Some recent data might be hidden     Patient questions:  Do you have a fever, pain , or abdominal swelling? No. Pain Score  0 *  Have you tolerated food without any problems? Yes.    Have you been able to return to your normal activities? Yes.    Do you have any questions about your discharge instructions: Diet   No. Medications  No. Follow up visit  No.  Do you have questions or concerns about your Care? No.  Actions: * If pain score is 4 or above: No action needed, pain <4. 1. Have you developed a fever since your procedure? no  2.   Have you had an respiratory symptoms (SOB or cough) since your procedure? no  3.   Have you tested positive for COVID 19 since your procedure no  4.   Have you had any family members/close contacts diagnosed with the COVID 19 since your procedure?  no   If yes to any of these questions please route to Joylene John, RN and Alphonsa Gin, Therapist, sports.

## 2019-04-05 ENCOUNTER — Encounter: Payer: Self-pay | Admitting: Internal Medicine

## 2019-04-05 NOTE — Progress Notes (Signed)
2 adenomas max 20 mm and distal sigmoid hyperplastic Recall 2023 My Chart

## 2019-05-06 ENCOUNTER — Ambulatory Visit: Payer: Medicare HMO | Admitting: Internal Medicine

## 2019-05-25 DIAGNOSIS — Z8616 Personal history of COVID-19: Secondary | ICD-10-CM

## 2019-05-25 HISTORY — DX: Personal history of COVID-19: Z86.16

## 2019-06-18 ENCOUNTER — Encounter: Payer: Self-pay | Admitting: Internal Medicine

## 2019-06-19 ENCOUNTER — Telehealth: Payer: Self-pay | Admitting: *Deleted

## 2019-06-19 ENCOUNTER — Other Ambulatory Visit: Payer: Self-pay

## 2019-06-19 DIAGNOSIS — U071 COVID-19: Secondary | ICD-10-CM

## 2019-06-19 DIAGNOSIS — Z20822 Contact with and (suspected) exposure to covid-19: Secondary | ICD-10-CM

## 2019-06-19 NOTE — Telephone Encounter (Signed)
-----   Message from [name], MD sent at 06/19/2019  7:33 AM EDT ----- Can we arrange for him to get tested for COVID?  Robert [name]

## 2019-06-19 NOTE — Telephone Encounter (Signed)
covid test ordered and patient is aware

## 2019-06-20 ENCOUNTER — Encounter: Payer: Self-pay | Admitting: Internal Medicine

## 2019-06-20 LAB — NOVEL CORONAVIRUS, NAA: SARS-CoV-2, NAA: NOT DETECTED

## 2019-07-01 ENCOUNTER — Encounter: Payer: Self-pay | Admitting: Internal Medicine

## 2019-07-02 ENCOUNTER — Other Ambulatory Visit: Payer: Medicare HMO

## 2019-07-02 ENCOUNTER — Encounter: Payer: Self-pay | Admitting: Internal Medicine

## 2019-07-02 ENCOUNTER — Ambulatory Visit (INDEPENDENT_AMBULATORY_CARE_PROVIDER_SITE_OTHER): Payer: Medicare HMO

## 2019-07-02 ENCOUNTER — Other Ambulatory Visit: Payer: Self-pay

## 2019-07-02 ENCOUNTER — Ambulatory Visit (INDEPENDENT_AMBULATORY_CARE_PROVIDER_SITE_OTHER): Payer: Medicare HMO | Admitting: Internal Medicine

## 2019-07-02 VITALS — BP 120/78 | HR 90 | Temp 98.6°F | Wt 220.6 lb

## 2019-07-02 DIAGNOSIS — M25532 Pain in left wrist: Secondary | ICD-10-CM | POA: Diagnosis not present

## 2019-07-02 DIAGNOSIS — Z23 Encounter for immunization: Secondary | ICD-10-CM | POA: Diagnosis not present

## 2019-07-02 NOTE — Patient Instructions (Signed)
-  Nice seeing you today!!  -Left wrist xray today.  -Ice, ibuprofen and wrist splint to your left wrist. If no improvement in 10-14 days, send me a message and we will refer you to orthopedics.

## 2019-07-02 NOTE — Addendum Note (Signed)
Addended by: Westley Hummer B on: 07/02/2019 03:23 PM   Modules accepted: Orders

## 2019-07-02 NOTE — Progress Notes (Signed)
Acute Office Visit     CC/Reason for Visit: Left wrist pain  HPI: Robert [name] is a 68 y.o. male who is coming in today for the above mentioned reasons.  He has been having left wrist pain on and off for the past 4 weeks or so.  There is no discrete injury that he can recall.  He has noticed decreased grip strength but no numbness or tingling of his fingertips.  Yesterday his wrist was very achy so he decided to come in today for evaluation.  Past Medical/Surgical History: Past Medical History:  Diagnosis Date  . Diabetes mellitus type [name]   . HLD (hyperlipidemia)   . HTN (hypertension)   . Hx of colonic polyps   . Paroxysmal atrial fibrillation ([place]) 03/12/2019  . Skin cancer    history of    Past Surgical History:  Procedure Laterality Date  . COLONOSCOPY    . PATELLAR TENDON REPAIR     Left  . Removal of excess tissue     Right and Left upper eyelids    Social History:  reports that he quit smoking about 8 years ago. His smoking use included cigars. He has never used smokeless tobacco. He reports current alcohol use. He reports that he does not use drugs.  Allergies: No Known Allergies  Family History:  Family History  Problem Relation Age of Onset  . Aortic aneurysm Other        family history  . Cancer Other        Breast (1st degree relative)  . Pancreatic cancer Mother 13  . Colon cancer Neg Hx   . Rectal cancer Neg Hx   . Stomach cancer Neg Hx   . Esophageal cancer Neg Hx      Current Outpatient Medications:  .  atorvastatin (LIPITOR) 40 MG tablet, Take 1 tablet (40 mg total) by mouth daily., Disp: 90 tablet, Rfl: 1 .  Coenzyme Q10 (COQ10 PO), Take 1 capsule by mouth daily. , Disp: , Rfl:  .  Dulaglutide (TRULICITY) 1.5 0000000 SOPN, Inject 1.5 mg into the skin once a week., Disp: 4 pen, Rfl: 11 .  ELIQUIS 5 MG TABS tablet, TAKE 1 TABLET BY MOUTH TWICE A DAY, Disp: 60 tablet, Rfl: 6 .  glucose blood test strip, 1 each by Other route  daily. Use daily for glucose control - Dx 250.00, Disp: 100 each, Rfl: 0 .  hydrochlorothiazide (MICROZIDE) 12.5 MG capsule, TAKE 1 CAPSULE BY MOUTH EVERY DAY, Disp: 100 capsule, Rfl: 3 .  lisinopril (PRINIVIL,ZESTRIL) 40 MG tablet, 1 p.o. every morning, Disp: 100 tablet, Rfl: 3 .  metFORMIN (GLUCOPHAGE) 1000 MG tablet, Take 1 tablet (1,000 mg total) by mouth 2 (two) times daily with a meal., Disp: 180 tablet, Rfl: 3 .  propranolol (INDERAL) 10 MG tablet, Take 1 tablet (10 mg total) by mouth 4 (four) times daily as needed (palpitations, irregular or fast heart rate)., Disp: 90 tablet, Rfl: 3 .  sildenafil (REVATIO) 20 MG tablet, TAKE 1 TO 2 TABLETS 2 TO 3 HOURS BEFORE SEX, Disp: 30 tablet, Rfl: 11  Review of Systems:  Constitutional: Denies fever, chills, diaphoresis, appetite change and fatigue.  HEENT: Denies photophobia, eye pain, redness, hearing loss, ear pain, congestion, sore throat, rhinorrhea, sneezing, mouth sores, trouble swallowing, neck pain, neck stiffness and tinnitus.   Respiratory: Denies SOB, DOE, cough, chest tightness,  and wheezing.   Cardiovascular: Denies chest pain, palpitations and leg swelling.  Gastrointestinal: Denies  nausea, vomiting, abdominal pain, diarrhea, constipation, blood in stool and abdominal distention.  Genitourinary: Denies dysuria, urgency, frequency, hematuria, flank pain and difficulty urinating.  Endocrine: Denies: hot or cold intolerance, sweats, changes in hair or nails, polyuria, polydipsia. Musculoskeletal: Denies myalgias, back pain and gait problem.  Skin: Denies pallor, rash and wound.  Neurological: Denies dizziness, seizures, syncope, weakness, light-headedness, numbness and headaches.  Hematological: Denies adenopathy. Easy bruising, personal or family bleeding history  Psychiatric/Behavioral: Denies suicidal ideation, mood changes, confusion, nervousness, sleep disturbance and agitation    Physical Exam: Vitals:   07/02/19 1406  BP:  120/78  Pulse: 90  Temp: 98.6 F (37 C)  TempSrc: Temporal  SpO2: 96%  Weight: 220 lb 9.6 oz (100.1 kg)    Body mass index is 27.57 kg/m.   Constitutional: NAD, calm, comfortable Eyes: PERRL, lids and conjunctivae normal ENMT: Mucous membranes are moist.  Musculoskeletal: Left wrist is edematous, no redness is appreciable, no shooting pains/numbness in his fingers when palpating the inside of his wrist while it is hyperextended. Skin: no rashes, lesions, ulcers. No induration Neurologic: Grossly intact and nonfocal Psychiatric: Normal judgment and insight. Alert and oriented x 3. Normal mood.    Impression and Plan:  Left wrist pain  -Based on exam today, unlikely to be carpal tunnel. -He does not recall any discrete injuries, however with significant edema will go ahead and send for an x-ray today. -I wonder whether he may have gout. -Will try conservative measures first: Icing, splinting, PRN ibuprofen. -If no improvement consider referral to orthopedics for further evaluation.   Patient Instructions  -Nice seeing you today!!  -Left wrist xray today.  -Ice, ibuprofen and wrist splint to your left wrist. If no improvement in 10-14 days, send me a message and we will refer you to orthopedics.     Lelon Frohlich, MD [facility]

## 2019-07-02 NOTE — Telephone Encounter (Signed)
Appointment scheduled.

## 2019-07-15 ENCOUNTER — Other Ambulatory Visit: Payer: Self-pay | Admitting: Cardiovascular Disease

## 2019-07-15 NOTE — Telephone Encounter (Signed)
21m 100.1kg Scr 0.86 07/23/18 Lovw/weaver 02/20/19

## 2019-07-24 ENCOUNTER — Other Ambulatory Visit: Payer: Self-pay | Admitting: Internal Medicine

## 2019-07-24 DIAGNOSIS — I10 Essential (primary) hypertension: Secondary | ICD-10-CM

## 2019-07-30 ENCOUNTER — Encounter: Payer: Self-pay | Admitting: Internal Medicine

## 2019-07-30 ENCOUNTER — Other Ambulatory Visit: Payer: Self-pay

## 2019-07-30 ENCOUNTER — Ambulatory Visit (INDEPENDENT_AMBULATORY_CARE_PROVIDER_SITE_OTHER): Payer: Medicare HMO | Admitting: Internal Medicine

## 2019-07-30 VITALS — BP 116/76 | HR 95 | Temp 97.7°F | Ht 74.0 in | Wt 216.0 lb

## 2019-07-30 DIAGNOSIS — E119 Type 2 diabetes mellitus without complications: Secondary | ICD-10-CM

## 2019-07-30 LAB — POCT GLYCOSYLATED HEMOGLOBIN (HGB A1C): Hemoglobin A1C: 6.7 % — AB (ref 4.0–5.6)

## 2019-07-30 NOTE — Patient Instructions (Addendum)
Please continue: - Trulicity 1.5 mg weekly. - Metformin 2000 mg with dinner - Glipizide 5 mg before a large dinner  KEEP UP THE GOOD WORK!  Please return in 4 months with your sugar log.

## 2019-07-30 NOTE — Progress Notes (Signed)
Patient ID: Robert [name], male   DOB: 1951/03/31, 68 y.o.   MRN: [id number]   HPI: Robert [name] is a 68 y.o.-year-old male, returning for follow-up for  DM2, dx in 2000s, insulin-dependent since 2018, uncontrolled, with complications (PN, ED).  Last visit 7 months ago.  He is on a mostly plant-based diet >> sugars are better.  Wife was dx'ed with Covid 19 in 05/2019. Pt was negative but did lose his taste and smell (!) for a short period of time.  He was also little bit more fatigued with exertion but no other symptoms.  Last hemoglobin A1c was: Lab Results  Component Value Date   HGBA1C 7.1 (A) 01/03/2019   HGBA1C 6.5 (A) 09/04/2018   HGBA1C 6.2 (A) 04/30/2018   Pt is on a regimen of: - Trulicity 1.5 mg weekly. - Metformin 1000 mg 2x a day >> 2000 mg with dinner - Glipizide 5 mg before a large dinner - added 12/2018 We stopped Novolog 70/30 10 units 2x a day in 11/2017, when starting Trulicity.  Pt checks his sugars 1-4 times a day: - am: 119-142 >> 95-154, 161, 200 >> 141-187 >> 109-156, 175 (fried chicken) - 2h after b'fast: 99-137, 164 >> 106-167, 217 >> 150 >> 130, 139 - before lunch: 90-111 >> 104-117, 169 >> 107, 140 >> 106,138, 194 (PB) - 2h after lunch: 101-161, 236, 218 >> 70-226 >> 93-161 >> 130-204, 224  - before dinner: 81-159, 159 >> 87-218 >> 131-169 >> 97-156, 249 - 2h after dinner: 96-171 >> 101-168 >> 111-208 >> 112-206, 224 - bedtime: 93-107 >> 88-139 >> 83-143 >> 113, 196 >> 83-187 - nighttime: 73 >> 51/62, 80-126 >> 129 >> 91 (wine) >> 118 Lowest sugar was 28 right after starting insulin >> 77 >> 51 (before stopping Glipizide at night) >> 70 >> 91 >> 83; he has hypoglycemia awareness in the 70s. Highest sugar was 226 >> 208 >> 224.  Glucometer: ReliOn  Pt's meals are: - Breakfast: whole wheat toast + butter, 1x a weeK: eggs, cheese - Lunch: tuna salad or tuna sandwich ([employer] delivery), soft taco, meat + baked beans or potatoes -  Dinner: [employer] delivery - Snacks: 1-0: nuts, bananas, other fruit He decreased his alcohol intake before last visit He continues to swim 5 times a week.  -No CKD; last BUN/creatinine:  Lab Results  Component Value Date   BUN 17 07/23/2018   BUN 19 10/04/2017   CREATININE 0.86 07/23/2018   CREATININE 1.02 10/04/2017  On lisinopril 40. -+ HL; last set of lipids: Lab Results  Component Value Date   CHOL 140 07/23/2018   HDL 32.40 (L) 07/23/2018   LDLCALC 68 07/23/2018   LDLDIRECT 77.0 07/07/2015   TRIG 198.0 (H) 07/23/2018   CHOLHDL 4 07/23/2018  On Lipitor 40 mg every other day + co-Q10. - last eye exam was 03/2018: No DR, + small cataract -+ Numbness and tingling in his feet  He has a h/o arrhythmia (A fib) - wore an event monitor >> now on beta blocker. Cardiologist: Dr. Acie Fredrickson.  ROS: Constitutional: no weight gain/+ weight loss, no fatigue, no subjective hyperthermia, no subjective hypothermia Eyes: no blurry vision, no xerophthalmia ENT: no sore throat, no nodules palpated in neck, no dysphagia, no odynophagia, no hoarseness Cardiovascular: no CP/no SOB/no palpitations/no leg swelling Respiratory: no cough/no SOB/no wheezing Gastrointestinal: no N/no V/no D/no C/no acid reflux Musculoskeletal: no muscle aches/no joint aches Skin: no rashes, no hair loss  Neurological: no tremors/+ numbness/+ tingling/no dizziness  I reviewed pt's medications, allergies, PMH, social hx, family hx, and changes were documented in the history of present illness. Otherwise, unchanged from my initial visit note.  Past Medical History:  Diagnosis Date  . Diabetes mellitus type [name]   . HLD (hyperlipidemia)   . HTN (hypertension)   . Hx of colonic polyps   . Paroxysmal atrial fibrillation ([place]) 03/12/2019  . Skin cancer    history of   Past Surgical History:  Procedure Laterality Date  . COLONOSCOPY    . PATELLAR TENDON REPAIR     Left  . Removal of excess tissue     Right and Left  upper eyelids   Social History   Socioeconomic History  . Marital status: Married    Spouse name: Not on file  . Number of children: 2  Social Needs  Occupational History  . Occupation: [employer]  . Occupation: Cabin crew: Self Employed, retired  Tobacco Use  . Smoking status: Former Smoker    Types: Cigars    Last attempt to quit: 02/22/2011  . Smokeless tobacco: Never Used  . Tobacco comment: 2 cigars monthly or less  Substance and Sexual Activity  . Alcohol use: Yes     0.0 oz    Vodka, 2-3 drinks 3x a week   Drugs: No   Current Outpatient Medications on File Prior to Visit  Medication Sig Dispense Refill  . atorvastatin (LIPITOR) 40 MG tablet Take 1 tablet (40 mg total) by mouth daily. 90 tablet 1  . Coenzyme Q10 (COQ10 PO) Take 1 capsule by mouth daily.     . Dulaglutide (TRULICITY) 1.5 0000000 SOPN Inject 1.5 mg into the skin once a week. 4 pen 11  . ELIQUIS 5 MG TABS tablet TAKE 1 TABLET BY MOUTH TWICE A DAY 180 tablet 0  . glucose blood test strip 1 each by Other route daily. Use daily for glucose control - Dx 250.00 100 each 0  . hydrochlorothiazide (MICROZIDE) 12.5 MG capsule TAKE 1 CAPSULE BY MOUTH EVERY DAY 100 capsule 3  . lisinopril (ZESTRIL) 40 MG tablet TAKE 1 AND 1/2 TABLET BY MOUTH EVERY DAY 135 tablet 1  . metFORMIN (GLUCOPHAGE) 1000 MG tablet Take 1 tablet (1,000 mg total) by mouth 2 (two) times daily with a meal. 180 tablet 3  . propranolol (INDERAL) 10 MG tablet Take 1 tablet (10 mg total) by mouth 4 (four) times daily as needed (palpitations, irregular or fast heart rate). 90 tablet 3  . sildenafil (REVATIO) 20 MG tablet TAKE 1 TO 2 TABLETS 2 TO 3 HOURS BEFORE SEX 30 tablet 11   No current facility-administered medications on file prior to visit.    No Known Allergies Family History  Problem Relation Age of Onset  . Aortic aneurysm Other        family history  . Cancer Other        Breast (1st degree relative)  . Pancreatic cancer  Mother 48  . Colon cancer Neg Hx   . Rectal cancer Neg Hx   . Stomach cancer Neg Hx   . Esophageal cancer Neg Hx     PE: BP 116/76   Pulse 95   Temp 97.7 F (36.5 C)   Ht 6\' 2"  (1.88 m)   Wt 216 lb (98 kg)   SpO2 98%   BMI 27.73 kg/m  Wt Readings from Last 3 Encounters:  07/30/19 216 lb (98 kg)  07/02/19 220 lb 9.6 oz (100.1 kg)  03/28/19 218 lb (98.9 kg)   Constitutional: overweight, in NAD Eyes: PERRLA, EOMI, no exophthalmos ENT: moist mucous membranes, no thyromegaly, no cervical lymphadenopathy Cardiovascular: Tachycardia, RR, No MRG Respiratory: CTA B Gastrointestinal: abdomen soft, NT, ND, BS+ Musculoskeletal: no deformities, strength intact in all 4 Skin: moist, warm, no rashes Neurological: no tremor with outstretched hands, DTR normal in all 4  ASSESSMENT: 1. DM2, insulin-dependent, uncontrolled, with complications - PN - ED  2. HL  3.  Overweight  PLAN:  1. Patient with longstanding, previously uncontrolled type 2 diabetes, on oral antidiabetic regimen with metformin, glipizide, and also weekly GLP-1 receptor agonist.  At last visit, sugars were higher, especially in the morning, but with occasional spikes later in the day, after dinner.  We moved the entire dose of metformin at night to hopefully improve his sugars in the morning.  We also discussed about taking the glipizide before larger dinner, or at dinner that had more carbs.  He was trying to apply plant-based diet concepts and we discussed that this will greatly improve his insulin sensitivity and therefore his blood sugars.  At last visit, HbA1c was 7.1%, slightly higher. -At this visit, sugars are better overall except for dietary indiscretions.  He follows a mostly plant-based diet, but occasionally has a noncompliant meal.  He is using the situations to learn what he can and what he cannot eat.  He is doing a great job with this.  I do not feel we need to change his regimen for now. - I suggested to:   Patient Instructions  Please continue: - Trulicity 1.5 mg weekly. - Metformin 2000 mg with dinner - Glipizide 5 mg before a large dinner  Please return in 4 months with your sugar log.   - we checked his HbA1c: 6.7% (better) - advised to check sugars at different times of the day - 1-2x a day, rotating check times - advised for yearly eye exams >> he is not UTD, but will schedule one soon - APE pending in 4-5 weeks - UTD with flu shot - return to clinic in 4 months   2.  HL -Reviewed latest lipid panel from last year: LDL at goal, triglycerides high, HDL Lab Results  Component Value Date   CHOL 140 07/23/2018   HDL 32.40 (L) 07/23/2018   LDLCALC 68 07/23/2018   LDLDIRECT 77.0 07/07/2015   TRIG 198.0 (H) 07/23/2018   CHOLHDL 4 07/23/2018  -Continues Lipitor and CoQ10 without side effects  3.  Overweight -He lost approximately 21 pounds after starting GLP-1 receptor agonist, but gained 7 pounds before last visit. He lost 12 lbs since last OV! -We will continue with the Trulicity, which should also help with weight loss  [name], MD PhD [name]

## 2019-08-01 ENCOUNTER — Other Ambulatory Visit: Payer: Self-pay

## 2019-08-01 DIAGNOSIS — Z20828 Contact with and (suspected) exposure to other viral communicable diseases: Secondary | ICD-10-CM | POA: Diagnosis not present

## 2019-08-01 DIAGNOSIS — Z20822 Contact with and (suspected) exposure to covid-19: Secondary | ICD-10-CM

## 2019-08-02 LAB — NOVEL CORONAVIRUS, NAA: SARS-CoV-2, NAA: NOT DETECTED

## 2019-08-05 ENCOUNTER — Encounter: Payer: Self-pay | Admitting: Internal Medicine

## 2019-08-14 ENCOUNTER — Other Ambulatory Visit: Payer: Self-pay | Admitting: *Deleted

## 2019-08-14 DIAGNOSIS — E119 Type 2 diabetes mellitus without complications: Secondary | ICD-10-CM

## 2019-08-14 MED ORDER — METFORMIN HCL 1000 MG PO TABS: 1000.0000 mg | ORAL_TABLET | Freq: Two times a day (BID) | ORAL | 1 refills | Status: DC

## 2019-09-12 ENCOUNTER — Encounter: Payer: Self-pay | Admitting: Internal Medicine

## 2019-09-13 ENCOUNTER — Other Ambulatory Visit: Payer: Self-pay | Admitting: Internal Medicine

## 2019-09-13 DIAGNOSIS — M25532 Pain in left wrist: Secondary | ICD-10-CM

## 2019-09-24 DIAGNOSIS — H43813 Vitreous degeneration, bilateral: Secondary | ICD-10-CM | POA: Diagnosis not present

## 2019-09-24 DIAGNOSIS — H2513 Age-related nuclear cataract, bilateral: Secondary | ICD-10-CM | POA: Diagnosis not present

## 2019-09-24 DIAGNOSIS — E1136 Type 2 diabetes mellitus with diabetic cataract: Secondary | ICD-10-CM | POA: Diagnosis not present

## 2019-09-24 DIAGNOSIS — H5201 Hypermetropia, right eye: Secondary | ICD-10-CM | POA: Diagnosis not present

## 2019-09-24 DIAGNOSIS — E119 Type 2 diabetes mellitus without complications: Secondary | ICD-10-CM | POA: Diagnosis not present

## 2019-09-24 LAB — HM DIABETES EYE EXAM

## 2019-09-25 ENCOUNTER — Other Ambulatory Visit: Payer: Self-pay

## 2019-09-25 ENCOUNTER — Ambulatory Visit: Payer: Self-pay

## 2019-09-25 ENCOUNTER — Ambulatory Visit (INDEPENDENT_AMBULATORY_CARE_PROVIDER_SITE_OTHER): Payer: Medicare HMO | Admitting: Family Medicine

## 2019-09-25 ENCOUNTER — Encounter: Payer: Self-pay | Admitting: Family Medicine

## 2019-09-25 VITALS — BP 108/68 | HR 83 | Ht 74.0 in | Wt 217.8 lb

## 2019-09-25 DIAGNOSIS — M25532 Pain in left wrist: Secondary | ICD-10-CM

## 2019-09-25 NOTE — Progress Notes (Signed)
Subjective:    I'm seeing this patient as a consultation for:  Dr. Isaac Bliss  CC: L wrist pain  I, [name], LAT, ATC, am serving as scribe for Dr. Lynne Leader.  HPI: Pt is a 68 y/o male presenting w/ c/o L wrist pain since August 2020 w/ no known MOI.  He saw his PCP on 07/02/19 and reported L wrist discomfort, swelling and decreased grip strength.  He had a L wrist XR on 07/02/19 and was instructed to use a wrist splint/brace, ice and take OTC pain meds as needed.  Currently, the pt notes continued L wrist pain/discomfort and reports decreased L wrist ROM and grip strength.  Pt notes occasional popping in his L wrist and radiating pain into his L forearm w/ certain movements like when holding his phone in his L hand for a prolonged period of time.  Pt notes the majority of his pain along the ulnar and radial side of his wrist.  He is currently not using a brace, nor is he using any regular OTC meds.  Past medical history, Surgical history, Family history not pertinant except as noted below, Social history, Allergies, and medications have been entered into the medical record, reviewed, and no changes needed.   Review of Systems: No headache, visual changes, nausea, vomiting, diarrhea, constipation, dizziness, abdominal pain, skin rash, fevers, chills, night sweats, weight loss, swollen lymph nodes, body aches, joint swelling, muscle aches, chest pain, shortness of breath, mood changes, visual or auditory hallucinations.   Objective:    Vitals:   09/25/19 1019  BP: 108/68  Pulse: 83  SpO2: 97%   General: Well Developed, well nourished, and in no acute distress.  Neuro/Psych: Alert and oriented x3, extra-ocular muscles intact, able to move all 4 extremities, sensation grossly intact. Skin: Warm and dry, no rashes noted.  Respiratory: Not using accessory muscles, speaking in full sentences, trachea midline.  Cardiovascular: Pulses palpable, no extremity edema. Abdomen: Does not  appear distended. MSK:  Left wrist normal-appearing no deformity or effusion. Mildly tender palpation at dorsal aspect of the ulnar styloid Intact strength.  Pain reproduced with resisted wrist extension and ulnar deviation. Negative Finkelstein's test. Pulses cap refill and sensation are intact distally.  Lab and Radiology Results Dg Wrist Complete Left  Result Date: 07/03/2019 CLINICAL DATA:  Left wrist pain. EXAM: LEFT WRIST - COMPLETE 3+ VIEW COMPARISON:  None. FINDINGS: There is no evidence of fracture or dislocation. There is no evidence of arthropathy or other focal bone abnormality. Soft tissues are unremarkable. IMPRESSION: Negative. Electronically Signed   By: Marijo Conception M.D.   On: 07/03/2019 08:33   I, Lynne Leader, personally (independently) visualized and performed the interpretation of the images attached in this note.  Limited musculoskeletal ultrasound dorsal wrist reveal Slight irregularity appearance of extensor carpi ulnaris tendon near insertion either partial tear versus early separation/fasciculation.  Slight effusion and tendon sheath.  Bony structures otherwise normal-appearing TFCC area narrowed no definitive visible tear present. 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  Possible TFCC tear.  Impression and Recommendations:    Assessment and Plan: 68 y.o. male with left wrist pain ongoing for several months.  Physical exam and ultrasound concerning for extensor carpi ulnaris tendinopathy and possibly TFCC tear.  Discussed treatment options.  Fortunately he is not severely disabled by his symptoms.  We can continue to try conservative management.  Will refer to occupational therapy and proceed with trial of diclofenac gel and wrist sleeve.  Recheck  back in 1 month.  If no better neck step will likely be MRI arthrogram.  Of note patient has had a history of several tendon avulsions including Achilles tendon, patellar tendon, and distal biceps  tendon tears.  I would be very careful to avoid tendon or tendon sheath injections given his history of tendon tears in the past.  Additionally recommend avoiding fluoroquinolones in the future as well.[name] Kitchen  PDMP not reviewed this encounter. Orders Placed This Encounter  Procedures  . NO CHG - [place] LOWER LEFT    Order Specific Question:   Reason for Exam (SYMPTOM  OR DIAGNOSIS REQUIRED)    Answer:   L wrist pain    Order Specific Question:   Preferred imaging location?    Answer:   [facility]  . Ambulatory referral to Occupational Therapy    Referral Priority:   Routine    Referral Type:   Occupational Therapy    Referral Reason:   Specialty Services Required    Requested Specialty:   Occupational Therapy    Number of Visits Requested:   1   No orders of the defined types were placed in this encounter.   Discussed warning signs or symptoms. Please see discharge instructions. Patient expresses understanding.  The above documentation has been reviewed and is accurate and complete Lynne Leader

## 2019-09-25 NOTE — Patient Instructions (Signed)
Thank you for coming in today. Recommend occupational or hand PT.  You should hear soon.  Also use voltaren gel over the counter 4x daily for pain as needed.  Recheck in about 1 month.   We're moving!  Dr. Clovis Riley new office will be located at [address] on the 1st floor.  This location is across the street from the [employer] and in the same complex as the [facility] and [employer]  Our new office phone number will be [phone].  We anticipate beginning to see patients at the [facility] office in early December 2020.  I am concerned about Extensor Carpi Ulnaris (ECU)  tendonitis and TFCC injury.    Triangular Fibrocartilage Tear  A triangular fibrocartilage tear is a tear in cartilage or tissues that connect bones to each other (ligaments) along the pinkie side of your wrist. The cartilage and ligaments in your wrist help to cushion and support the bones of your wrist. What are the causes? This condition may be caused by:  Falling onto an outstretched hand and overextending your wrist.  Doing repetitive motions (overuse) that put too much pressure on your wrist. What increases the risk? You are more likely to develop this condition if you:  Have one forearm bone that is shorter than the other.  Participate in sports that put pressure on the wrist, such as: ? Gymnastics. ? Tennis. ? Golf. ? Baseball. ? Racquetball. ? Hockey. What are the signs or symptoms? Symptoms of this condition include:  Pain or tenderness on the pinkie side of your wrist.  A clicking or popping sensation in the wrist.  Reduced grip strength. How is this diagnosed? This condition may be diagnosed based on:  Your symptoms.  Your medical history.  A physical exam. During the exam, your health care provider may move your hand and wrist to determine what is causing your pain.  Tests, such as: ? An X-ray. This may be done to check for broken bones. ? An MRI.  This may be done to check ligaments and cartilage and to look for broken bones that did not show up on your X-ray. ? An arthrogram. This is a kind of X-ray that is taken after a dye is injected into your joint. ? Diagnostic arthroscopy. This is a surgical procedure that lets your health care provider see inside your wrist joint. It may be done if the cause of your wrist pain is not clear after you have other tests. How is this treated? Treatment for this condition may include:  Resting the wrist. You may need to avoid or modify your participation in sports or other physical activity for a period of time.  Icing the wrist. This can help with swelling and pain.  Keeping the wrist raised (elevated) above your heart. This helps reduce swelling.  Using a splint or cast. This helps keep the wrist still so it can heal.  Doing physical therapy. This helps restore strength and range of motion in the wrist.  Taking anti-inflammatory medicine, such as ibuprofen. These medicines can help reduce pain and swelling.  Having surgery. More serious tears or complete tears may require surgery to repair a ligament. Follow these instructions at home: If you have a splint:  Wear it as told by your health care provider. Remove it only as told by your health care provider.  Loosen it if your fingers tingle, become numb, or turn cold and blue. If you have a cast:  Do  not stick anything inside it to scratch your skin. Doing that increases your risk of infection.  You may put lotion on dry skin around the edges of the cast. Do not put lotion on the skin underneath it. If you have a splint or cast:  Do not put pressure on any part of the splint or cast until it is fully hardened. This may take several hours.  Check the skin around it every day. Tell your health care provider about any concerns.  Keep it clean.  If your splint or cast is not waterproof: ? Do not let it get wet. ? Cover it with a  watertight covering when you take a bath or a shower.  Ask your health care provider when it is safe to drive if you have a splint or cast on your hand. Managing pain, stiffness, and swelling   If directed, put ice on the injured area. ? If you have a removable splint, remove it as told by your health care provider. ? Put ice in a plastic bag. ? Place a towel between your skin and the bag or between your cast and the bag. ? Leave the ice on for 20 minutes, 2-3 times a day or as needed.  Move your fingers often to reduce stiffness and swelling.  Elevate the injured area above the level of your heart while you are sitting or lying down. Activity  Return to your normal activities as told by your health care provider. Ask your health care provider what activities are safe for you.  Do not use your wrist to support your body weight until your health care provider says that you can.  Do exercises only as told by your health care provider. General instructions  Take over-the-counter and prescription medicines only as told by your health care provider.  Do not use any products that contain nicotine or tobacco, such as cigarettes, e-cigarettes, and chewing tobacco. These can delay healing. If you need help quitting, ask your health care provider.  Keep all follow-up visits as told by your health care provider. This is important. How is this prevented?  Warm up and stretch before being active.  Cool down and stretch after being active.  Give your body time to rest between periods of activity.  Be safe and responsible while being active. This will help you avoid falls.  Maintain physical fitness, including: ? Strength. ? Flexibility. Contact a health care provider if:  Your wrist pain does not improve or gets worse. Get help right away if:  You have severe pain.  You cannot move your wrist. Summary  A triangular fibrocartilage tear is a tear in cartilage or tissues that  connect bones to each other (ligaments) along the pinkie side of your wrist.  This condition may be caused by an injury from a fall or by overuse of your wrist due to repetitive movements.  Treatment includes rest, ice, wearing a splint or cast, medicine for pain and swelling, physical therapy, and surgery if needed.  Contact a health care provider if your wrist pain does not improve or gets worse. This information is not intended to replace advice given to you by your health care provider. Make sure you discuss any questions you have with your health care provider. Document Released: 10/10/2005 Document Revised: 01/29/2019 Document Reviewed: 09/24/2018 Elsevier Patient Education  2020 [place] Ulnaris Tendinitis Tendinitis is inflammation in the tissues that connect muscles to bone (tendons). The extensor carpi  ulnaris (ECU) tendon is located on the back of the wrist, on the side that is closest to the small finger. ECU tendinitis will cause pain on the small finger side of the wrist or forearm. It is a common sports-related injury. What are the causes? This condition may be caused by:  Putting repeated stress on your wrist.  Using the wrong technique while playing sports like golf or tennis.  Weakening of the tendon due to age or a health problem.  Injury or trauma. What increases the risk? You are more likely to develop this condition if:  You play sports that involve repeated motions of the wrist, such as golf, tennis, or rugby.  You are 19 years of age or older.  You have an inflammatory condition, such as rheumatoid arthritis. What are the signs or symptoms? Symptoms of this condition include:  Pain along the small finger side of your wrist or forearm when moving your wrist.  A constant ache on the small finger side of your wrist.  Feeling like your grip is weaker than usual.  A popping, snapping, or tearing feeling in your wrist.  Wrist  swelling. How is this diagnosed? This condition may be diagnosed based on:  Your symptoms and medical history.  A physical exam. During the exam, your health care provider will check the strength of your grip and may ask you to move your wrist. Your health care provider may also order an MRI or ultrasound to check for tears in your ligaments, muscles, or tendons. How is this treated? Treatment for this condition may include:  Resting your wrist.  Wearing a splint on your wrist.  Medicines to help with pain and swelling.  Applying heat or ice to the area to ease pain.  Exercises to help you maintain strength and range of motion in your wrist (physical therapy).  Therapy to help with everyday activities (occupational therapy). If these treatments do not help, you may need an injection of an anti-inflammatory medicine (steroid) mixed with a numbing medicine (local anesthetic). Follow these instructions at home: If you have a splint:   Wear it as told by your health care provider. Remove it only as told by your health care provider.  Loosen the splint if your fingers tingle, become numb, or turn cold and blue.  Keep the splint clean.  If the splint is not waterproof: ? Do not let it get wet. ? Cover it with a watertight covering when you take a bath or shower. ? Remove it for bathing and washing your hand only if instructed by your health care provider. Managing pain, stiffness, and swelling      If directed, put ice on the injured area. ? If you have a removable splint, remove it as told by your health care provider. ? Put ice in a plastic bag. ? Place a towel between your skin and the bag. ? Leave the ice on for 20 minutes, 2-3 times a day.  If directed, apply heat to the affected area as often as told by your health care provider. Use the heat source that your health care provider recommends, such as a moist heat pack or a heating pad. ? If you have a removable splint,  remove it as told by your health care provider. ? Place a towel between your skin and the heat source. ? Leave the heat on for 15-20 minutes. ? Remove the heat if your skin turns bright red. This is especially important if you are  unable to feel pain, heat, or cold. You may have a greater risk of getting burned.  Move your fingers often to reduce stiffness and swelling.  Raise (elevate) the injured area above the level of your heart while you are sitting or lying down. Activity  Avoid activities that cause pain or put strain on your wrist for as long as told by your health care provider.  Do not use your injured hand to support your body weight until your health care provider says that you can.  Return to your normal activities as told by your health care provider. Ask your health care provider what activities are safe for you.  Do exercises as told by your health care provider.  Ask your health care provider when it is safe to drive if you have a splint on your wrist. General instructions  Take over-the-counter and prescription medicines only as told by your health care provider.  Do not use any products that contain nicotine or tobacco, such as cigarettes, e-cigarettes, and chewing tobacco. These can delay healing. If you need help quitting, ask your health care provider.  Keep all follow-up visits as told by your health care provider. This is important. How is this prevented?  Warm up and stretch before being active.  Cool down and stretch after being active.  Give your body time to rest between periods of activity.  Use equipment that fits you and is in good condition.  If you play golf or racket sports, focus on proper form or try to improve your technique.  Maintain physical fitness, including: ? Strength. ? Flexibility. ? Endurance. Contact a health care provider if:  Your pain, tenderness, or swelling gets worse, even if you have had treatment. Get help right away  if:  Your pain becomes severe.  You cannot move your wrist. Summary  Extensor carpi ulnaris tendinitis is a condition that will cause pain on the small finger side of the wrist or forearm.  Treatment for this condition may include wearing a splint, taking pain medicines, applying heat or ice, and physical or occupational therapy.  Avoid activities that cause pain or put strain on your wrist for as long as told by your health care provider.  Return to your normal activities as told by your health care provider. This information is not intended to replace advice given to you by your health care provider. Make sure you discuss any questions you have with your health care provider. Document Released: 10/10/2005 Document Revised: 02/05/2019 Document Reviewed: 11/29/2018 Elsevier Patient Education  [place].

## 2019-09-27 ENCOUNTER — Encounter: Payer: Self-pay | Admitting: Internal Medicine

## 2019-09-28 ENCOUNTER — Other Ambulatory Visit: Payer: Self-pay | Admitting: Internal Medicine

## 2019-10-08 ENCOUNTER — Other Ambulatory Visit: Payer: Self-pay | Admitting: Internal Medicine

## 2019-10-08 DIAGNOSIS — E785 Hyperlipidemia, unspecified: Secondary | ICD-10-CM

## 2019-10-15 ENCOUNTER — Other Ambulatory Visit: Payer: Self-pay | Admitting: Physician Assistant

## 2019-10-15 DIAGNOSIS — Z7901 Long term (current) use of anticoagulants: Secondary | ICD-10-CM

## 2019-10-15 NOTE — Telephone Encounter (Signed)
Pt overdue for labs, called pt and LMOM for pt to call back.

## 2019-10-15 NOTE — Telephone Encounter (Signed)
Prescription refill request for Eliquis received.  Last office visit: 02/20/2019, Robert [name] Scr: 0.86, 07/23/2019 Age: 68 y.o. Weight: 98.8 kg

## 2019-10-16 ENCOUNTER — Other Ambulatory Visit: Payer: Self-pay | Admitting: *Deleted

## 2019-10-16 DIAGNOSIS — I10 Essential (primary) hypertension: Secondary | ICD-10-CM

## 2019-10-16 MED ORDER — HYDROCHLOROTHIAZIDE 12.5 MG PO CAPS: ORAL_CAPSULE | ORAL | 0 refills | Status: DC

## 2019-10-21 ENCOUNTER — Other Ambulatory Visit: Payer: Medicare HMO | Admitting: *Deleted

## 2019-10-21 ENCOUNTER — Other Ambulatory Visit: Payer: Self-pay

## 2019-10-21 DIAGNOSIS — Z7901 Long term (current) use of anticoagulants: Secondary | ICD-10-CM

## 2019-10-21 LAB — BASIC METABOLIC PANEL
BUN/Creatinine Ratio: 16 (ref 10–24)
BUN: 14 mg/dL (ref 8–27)
CO2: 23 mmol/L (ref 20–29)
Calcium: 9.3 mg/dL (ref 8.6–10.2)
Chloride: 100 mmol/L (ref 96–106)
Creatinine, Ser: 0.88 mg/dL (ref 0.76–1.27)
GFR calc Af Amer: 101 mL/min/{1.73_m2} (ref >59)
GFR calc non Af Amer: 88 mL/min/{1.73_m2} (ref >59)
Glucose: 158 mg/dL — ABNORMAL HIGH (ref 65–99)
Potassium: 4.5 mmol/L (ref 3.5–5.2)
Sodium: 138 mmol/L (ref 134–144)

## 2019-10-21 NOTE — Telephone Encounter (Signed)
lmom for overdue labs

## 2019-10-22 NOTE — Telephone Encounter (Signed)
Pt got blood work done. Scr: 0.88, 10/21/2019. Eliquis refill sent.

## 2019-10-24 ENCOUNTER — Encounter: Payer: Self-pay | Admitting: Internal Medicine

## 2019-10-28 ENCOUNTER — Ambulatory Visit: Payer: Medicare HMO | Admitting: Family Medicine

## 2019-10-28 ENCOUNTER — Other Ambulatory Visit: Payer: Self-pay

## 2019-10-28 ENCOUNTER — Encounter: Payer: Self-pay | Admitting: Family Medicine

## 2019-10-28 ENCOUNTER — Ambulatory Visit (INDEPENDENT_AMBULATORY_CARE_PROVIDER_SITE_OTHER)
Admission: RE | Admit: 2019-10-28 | Discharge: 2019-10-28 | Disposition: A | Discharge status: Home / Self Care | Payer: Medicare HMO | Source: Ambulatory Visit | Attending: Family Medicine | Admitting: Family Medicine

## 2019-10-28 ENCOUNTER — Ambulatory Visit (INDEPENDENT_AMBULATORY_CARE_PROVIDER_SITE_OTHER): Payer: Medicare HMO | Admitting: Family Medicine

## 2019-10-28 VITALS — BP 120/68 | HR 88 | Ht 74.0 in | Wt 217.8 lb

## 2019-10-28 DIAGNOSIS — M25551 Pain in right hip: Secondary | ICD-10-CM

## 2019-10-28 NOTE — Progress Notes (Signed)
I, [name], LAT, ATC, am serving as scribe for Dr. Lynne Leader.  Robert [name] is a 69 y.o. male who presents to [place] at [facility] today for f/u of L wrist pain.  Pt was last seen by Dr. [name] on 09/25/19 and was prescribed Voltaren gel and referred to OT.  Since his last visit, he reports 85-90% improvement in his wrist.  He has not been to OT but has been using the Voltaren gel.  He is c/o a new issue which is R lateral hip pain x 5 years.    He states that he is noting pain at the R greater trochanter and some intermittent radiating pain into the R groin and medial thigh w/ loaded R LE rotation.  He states that he got a new chair for watching TV at home and this seems to have helped somewhat.  His R hip is also aggravated w/ sit to stand transitions.  He has tried Biofreeze on his R hip.  He denies any new injury to explain the pain.    ROS:  As above  Exam:  BP 120/68 (BP Location: Left Arm, Patient Position: Sitting, Cuff Size: Large)   Pulse 88   Ht 6\' 2"  (1.88 m)   Wt 217 lb 12.8 oz (98.8 kg)   SpO2 97%   BMI 27.96 kg/m  Wt Readings from Last 5 Encounters:  10/28/19 217 lb 12.8 oz (98.8 kg)  09/25/19 217 lb 12.8 oz (98.8 kg)  07/30/19 216 lb (98 kg)  07/02/19 220 lb 9.6 oz (100.1 kg)  03/28/19 218 lb (98.9 kg)   General: Well Developed, well nourished, and in no acute distress.  Neuro/Psych: Alert and oriented x3, extra-ocular muscles intact, able to move all 4 extremities, sensation grossly intact. Skin: Warm and dry, no rashes noted.  Respiratory: Not using accessory muscles, speaking in full sentences, trachea midline.  Cardiovascular: Pulses palpable, no extremity edema. Abdomen: Does not appear distended. MSK:  L-spine: Nontender to spinal midline. Right hip: Normal-appearing Range of motion limited internal rotation and flexion with pain.  Normal external rotation. Tender palpation greater trochanter. Hip abduction strength  diminished 4/5.  External rotation strength internal rotation and adduction normal.  Left hip: Normal-appearing Flexion and internal rotation slightly limited but less so than left.  Normal external rotation. Nontender greater trochanter.    Lab and Radiology Results  X-ray images right hip obtained today personally independently reviewed Mild femoral acetabular DJD Loose bodies and calcific change present at insertion of gluteus medius tendon onto greater trochanter No acute fracture Await formal radiology review   Assessment and Plan: 69 y.o. male with right hip pain multifactorial.  Lateral hip pain due to hip abductor tendinitis/trochanteric bursitis.  Patient has calcific changes seen on x-ray.  Plan for home exercise program as taught by ATC today.  Additionally patient has groin pain that is due to likely intra-articular hip pathology including potentially femoral acetabular impingement or potentially a labrum tear.  See how he does with the home exercise program and if not improving would consider trial of diagnostic and therapeutic intra-articular hip injection.  Wrist pain considerable improvement.  Patient satisfied with level of improvement.  Watchful waiting from here.  Check back in about a month or so.  Return sooner if needed.  [id number]; 15 additional minutes spent for Therapeutic exercises as stated in above notes.  This included exercises focusing on stretching, strengthening, with significant focus on eccentric aspects.   Long  term goals include an improvement in range of motion, strength, endurance as well as avoiding reinjury. Patient's frequency would include in 1-2 times a day, 3-5 times a week for a duration of 6-12 weeks.  Proper technique shown and discussed handout in great detail with ATC.  All questions were discussed and answered.   PDMP not reviewed this encounter. Orders Placed This Encounter  Procedures  . DG HIP UNILAT WITH PELVIS 2-3 VIEWS RIGHT     Standing Status:   Future    Number of Occurrences:   1    Standing Expiration Date:   12/25/2020    Order Specific Question:   Reason for Exam (SYMPTOM  OR DIAGNOSIS REQUIRED)    Answer:   eval hip pain    Order Specific Question:   Preferred imaging location?    Answer:   [facility]    Order Specific Question:   Radiology Contrast Protocol - do NOT remove file path    Answer:   [email or web address]   No orders of the defined types were placed in this encounter.   Historical information moved to improve visibility of documentation.  Past Medical History:  Diagnosis Date  . Diabetes mellitus type [name]   . HLD (hyperlipidemia)   . HTN (hypertension)   . Hx of colonic polyps   . Paroxysmal atrial fibrillation ([place]) 03/12/2019  . Skin cancer    history of   Past Surgical History:  Procedure Laterality Date  . COLONOSCOPY    . PATELLAR TENDON REPAIR     Left  . Removal of excess tissue     Right and Left upper eyelids   Social History   Tobacco Use  . Smoking status: Former Smoker    Types: Cigars    Quit date: 02/22/2011    Years since quitting: 8.6  . Smokeless tobacco: Never Used  . Tobacco comment: 2 cigars monthly or less  Substance Use Topics  . Alcohol use: Yes    Alcohol/week: 0.0 standard drinks    Comment: 6-8oz /wk   family history includes Aortic aneurysm in an other family member; Cancer in an other family member; Pancreatic cancer (age of onset: 48) in his mother.  Medications: Current Outpatient Medications  Medication Sig Dispense Refill  . atorvastatin (LIPITOR) 40 MG tablet TAKE 1 TABLET BY MOUTH EVERY DAY 90 tablet 0  . Coenzyme Q10 (COQ10 PO) Take 1 capsule by mouth daily.     [name] Kitchen ELIQUIS 5 MG TABS tablet TAKE 1 TABLET BY MOUTH TWICE A DAY 180 tablet 1  . glucose blood test strip 1 each by Other route daily. Use daily for glucose control - Dx 250.00 100 each 0  . hydrochlorothiazide (MICROZIDE) 12.5 MG capsule  TAKE 1 CAPSULE BY MOUTH EVERY DAY 90 capsule 0  . lisinopril (ZESTRIL) 40 MG tablet TAKE 1 AND 1/2 TABLET BY MOUTH EVERY DAY 135 tablet 1  . metFORMIN (GLUCOPHAGE) 1000 MG tablet Take 1 tablet (1,000 mg total) by mouth 2 (two) times daily with a meal. 180 tablet 1  . propranolol (INDERAL) 10 MG tablet Take 1 tablet (10 mg total) by mouth 4 (four) times daily as needed (palpitations, irregular or fast heart rate). 90 tablet 3  . sildenafil (REVATIO) 20 MG tablet TAKE 1 TO 2 TABLETS 2 TO 3 HOURS BEFORE SEX 30 tablet 11  . TRULICITY 1.5 0000000 SOPN INJECT 1.5 MG INTO THE SKIN ONCE A WEEK. 4 pen 11   No current facility-administered medications for this visit.  No Known Allergies    Discussed warning signs or symptoms. Please see discharge instructions. Patient expresses understanding.  The above documentation has been reviewed and is accurate and complete Lynne Leader

## 2019-10-28 NOTE — Patient Instructions (Addendum)
Thank you for coming in today. I think you have trochanteric bursitis.  You also may have some hip arthritis.  Do the exercises we discussed.  Recheck in about 1 month.  Return sooner if needed.   Please perform the exercise program that we have prepared for you and gone over in detail on a daily basis.  In addition to the handout you were provided you can access your program through: [email or web address]  : [id number] Your unique program code is:

## 2019-10-29 NOTE — Progress Notes (Signed)
X-ray hip and pelvis show no acute fractures.  No severe arthritis.  If not improving injection will be helpful

## 2019-11-08 ENCOUNTER — Encounter: Payer: Self-pay | Admitting: Internal Medicine

## 2019-11-22 ENCOUNTER — Other Ambulatory Visit: Payer: Self-pay

## 2019-11-25 ENCOUNTER — Other Ambulatory Visit: Payer: Self-pay

## 2019-11-25 ENCOUNTER — Ambulatory Visit: Payer: Medicare HMO | Admitting: Family Medicine

## 2019-11-25 ENCOUNTER — Encounter: Payer: Self-pay | Admitting: Family Medicine

## 2019-11-25 VITALS — BP 130/70 | HR 85 | Ht 74.0 in | Wt 214.2 lb

## 2019-11-25 DIAGNOSIS — M25551 Pain in right hip: Secondary | ICD-10-CM | POA: Diagnosis not present

## 2019-11-25 DIAGNOSIS — M25512 Pain in left shoulder: Secondary | ICD-10-CM

## 2019-11-25 NOTE — Patient Instructions (Addendum)
Thank you for coming in today. Work on the exercises we discussed.  Return in about 1 month unless not better.   [email or web address]  Please perform the exercise program that we have prepared for you and gone over in detail on a daily basis.  In addition to the handout you were provided you can access your program through: [email or web address]   Your unique program code is: : [email or web address]

## 2019-11-25 NOTE — Progress Notes (Signed)
   I, Robert [name], LAT, ATC, am serving as scribe for Dr. Lynne [name].  Robert [name] is a 69 y.o. male who presents to [place] at [facility] today for f/u of L wrist pain and R hip pain.  He was last seen by Dr. Georgina [name] on 10/28/19 and noted 85-90% improvement in his L wrist pain.  He also c/o of new R groin and medial thigh pain at his last visit that was aggravated w/ loaded R LE rotation.  He was provided w/ a HEP for his R hip at his last visit.  Since his last visit, pt reports that his L wrist con't to feel well and con't to improve.  He states that his R hip is about 70% improved and feels like the exercises are helping.    New issue: L shoulder - Pt has a new c/o of L shoulder pain.  He states that he has pain along his L superior shoulder that will occasionally radiate into the L upper arm.  Pt notes some intermittent mechanical symptoms in his L shoulder.  He denies any neck pain, L UE weakness or numbness/tingling.    Diagnostic testing: L wrist XR- 07/02/19; R hip XR- 10/28/19   Pertinent review of systems: No fevers or chills  Relevant historical information: Diabetes hypercholesterolemia and A. fib.   Exam:  BP 130/70 (BP Location: Left Arm, Patient Position: Sitting, Cuff Size: Large)   Pulse 85   Ht 6\' 2"  (1.88 m)   Wt 214 lb 3.2 oz (97.2 kg)   SpO2 97%   BMI 27.50 kg/m  General: Well Developed, well nourished, and in no acute distress.   MSK:  Left shoulder normal-appearing nontender. Normal range of motion abduction external and internal rotation. Strength intact abduction external and internal rotation. Mildly positive Hawkins and Neer's test. Mildly positive empty can test. Negative Yergason's and speeds test. Negative crossover arm compression test.  Right shoulder normal-appearing nontender normal motion normal strength negative impingement testing.  Right hip: Normal gait.      Assessment and Plan: 69 y.o. male with  Left  shoulder pain: Rotator cuff tendinopathy/shoulder impingement.  Plan to treat with home exercise program as taught by ATC today.  Recheck back in a month.  If not better would proceed with imaging and injection.  Hip pain: Significant improvement with home exercise program.  Continue home exercise program watchful waiting.  If not better would proceed with intra-articular hip injection to differentiate interarticular cause of pain from tendinopathy.  Wrist pain almost completely resolved.  Watchful waiting  Additionally patient asked about the possibility of statins causing his tendinopathy.  I stated that there certainly is evidence for statin and muscle pain but I am not sure about statin and tendinopathy.  Recommend that he discuss this with his PCP.  Would recommend after his upcoming wellness visit and labs that he have a trial of 2 to 3 weeks off of statins to see if this helps.  Discussed that there are certain other options including latest generation statins like Livalo or even Repatha.  Additionally recommend signing up for the COVID-19 vaccine.  Provided handout.  HEP taught by ATC in clinic today.   Discussed warning signs or symptoms. Please see discharge instructions. Patient expresses understanding.   The above documentation has been reviewed and is accurate and complete Robert [name]     Total encounter time 30 minutes including charting time date of service.

## 2019-11-26 ENCOUNTER — Encounter: Payer: Self-pay | Admitting: Internal Medicine

## 2019-11-26 ENCOUNTER — Ambulatory Visit: Payer: Medicare HMO | Admitting: Internal Medicine

## 2019-11-26 VITALS — BP 128/80 | HR 90 | Ht 74.0 in | Wt 215.0 lb

## 2019-11-26 DIAGNOSIS — E119 Type 2 diabetes mellitus without complications: Secondary | ICD-10-CM

## 2019-11-26 DIAGNOSIS — E785 Hyperlipidemia, unspecified: Secondary | ICD-10-CM

## 2019-11-26 DIAGNOSIS — E663 Overweight: Secondary | ICD-10-CM | POA: Diagnosis not present

## 2019-11-26 LAB — POCT GLYCOSYLATED HEMOGLOBIN (HGB A1C): Hemoglobin A1C: 7 % — AB (ref 4.0–5.6)

## 2019-11-26 NOTE — Addendum Note (Signed)
Addended by: Cardell Peach I on: 11/26/2019 02:49 PM   Modules accepted: Orders

## 2019-11-26 NOTE — Patient Instructions (Signed)
Please continue: - Trulicity 1.5 mg weekly. - Metformin 2000 mg with dinner (- Glipizide 5 mg before a large dinner)  Please return in 4 months with your sugar log.

## 2019-11-26 NOTE — Progress Notes (Signed)
Patient ID: Robert [name], male   DOB: April 19, 1951, 69 y.o.   MRN: [id number]   This visit occurred during the SARS-CoV-2 public health emergency.  Safety protocols were in place, including screening questions prior to the visit, additional usage of staff PPE, and extensive cleaning of exam room while observing appropriate contact time as indicated for disinfecting solutions.   HPI: Robert [name] is a 69 y.o.-year-old male, returning for follow-up for  DM2, dx in 2000s, insulin-dependent since 2018, uncontrolled, with complications (PN, ED).  Last visit 4 months ago.  He is mostly on a plant-based diet.  Sugars improved on this.  He also likes how he feels on it.  Wife was dx'ed with Covid 19 in 05/2019. Pt was negative but did lose his taste and smell (!) for a short period of time.  No complaints at this visit.  Reviewed HbA1c levels: Lab Results  Component Value Date   HGBA1C 6.7 (A) 07/30/2019   HGBA1C 7.1 (A) 01/03/2019   HGBA1C 6.5 (A) 09/04/2018   Pt is on a regimen of: - Trulicity 1.5 mg weekly. - Metformin 1000 mg 2x a day >> 2000 mg with dinner - Glipizide 5 mg before a large dinner - added 12/2018 >> did not use it since last OV We stopped Novolog 70/30 10 units 2x a day in 11/2017, when starting Trulicity.  Pt checks his sugars 1-4 times a day: - am: 141-187 >> 109-156, 175 (fried chicken) >> 116-166 (170s - Holidays) - 2h after b'fast: 106-167, 217 >> 150 >> 130, 139 >> 134 - before lunch: 107, 140 >> 106,138, 194 (PB) >> n/c - 2h after lunch:  93-161 >> 130-204, 224  >> n/c - before dinner: 131-169 >> 97-156, 249 >> 96-125, 144, 199 (icecream) - 2h after dinner: 111-208 >> 112-206, 224 >> 103-155, 187 - bedtime: 83-143 >> 113, 196 >> 83-187 >> 85-149 - nighttime:51/62, 80-126 >> 129 >> 91 (wine) >> 118 >> n/c Lowest sugar was 28 right after starting insulin >> 77 >> 51 (before stopping Glipizide at night) ...83 >> 94; he has hypoglycemia awareness i in the  70s. Highest sugar was 224 >> 199.  Glucometer: ReliOn  Pt's meals are: - Breakfast: whole wheat toast + butter, 1x a weeK: eggs, cheese - Lunch: tuna salad or tuna sandwich ([employer] delivery), soft taco, meat + baked beans or potatoes - Dinner: [employer] delivery - Snacks: 1-0: nuts, bananas, other fruit He decreased his alcohol intake. He was swimming 5 times a week before the coronavirus pandemic, now exercising at home.  -No CKD; last BUN/creatinine:  Lab Results  Component Value Date   BUN 14 10/21/2019   BUN 17 07/23/2018   CREATININE 0.88 10/21/2019   CREATININE 0.86 07/23/2018  On lisinopril 40. -+ HL; last set of lipids: Lab Results  Component Value Date   CHOL 140 07/23/2018   HDL 32.40 (L) 07/23/2018   LDLCALC 68 07/23/2018   LDLDIRECT 77.0 07/07/2015   TRIG 198.0 (H) 07/23/2018   CHOLHDL 4 07/23/2018  On Lipitor 40 mg every other day + CoQ10 - last eye exam was 09/24/2019: No DR, + cataracts -He has numbness and tingling in his feet  He has a h/o arrhythmia (A fib) - wore an event monitor >> now on beta blocker. Cardiologist: Dr. Acie Fredrickson.  ROS: Constitutional: no weight gain/no weight loss, no fatigue, no subjective hyperthermia, no subjective hypothermia Eyes: no blurry vision, no xerophthalmia ENT: no sore throat, no  nodules palpated in neck, no dysphagia, no odynophagia, no hoarseness Cardiovascular: no CP/no SOB/no palpitations/no leg swelling Respiratory: no cough/no SOB/no wheezing Gastrointestinal: no N/no V/no D/no C/no acid reflux Musculoskeletal: no muscle aches/+ joint aches (hip, shoulder) Skin: no rashes, no hair loss Neurological: no tremors/+ numbness/+ tingling/no dizziness  I reviewed pt's medications, allergies, PMH, social hx, family hx, and changes were documented in the history of present illness. Otherwise, unchanged from my initial visit note.  Past Medical History:  Diagnosis Date  . Diabetes mellitus type [name]   . HLD  (hyperlipidemia)   . HTN (hypertension)   . Hx of colonic polyps   . Paroxysmal atrial fibrillation ([place]) 03/12/2019  . Skin cancer    history of   Past Surgical History:  Procedure Laterality Date  . COLONOSCOPY    . PATELLAR TENDON REPAIR     Left  . Removal of excess tissue     Right and Left upper eyelids   Social History   Socioeconomic History  . Marital status: Married    Spouse name: Not on file  . Number of children: 2  Social Needs  Occupational History  . Occupation: DVD's  . Occupation: Cabin crew: Self Employed, retired  Tobacco Use  . Smoking status: Former Smoker    Types: Cigars    Last attempt to quit: 02/22/2011  . Smokeless tobacco: Never Used  . Tobacco comment: 2 cigars monthly or less  Substance and Sexual Activity  . Alcohol use: Yes     0.0 oz    Vodka, 2-3 drinks 3x a week   Drugs: No   Current Outpatient Medications on File Prior to Visit  Medication Sig Dispense Refill  . atorvastatin (LIPITOR) 40 MG tablet TAKE 1 TABLET BY MOUTH EVERY DAY 90 tablet 0  . Coenzyme Q10 (COQ10 PO) Take 1 capsule by mouth daily.     [name] Kitchen ELIQUIS 5 MG TABS tablet TAKE 1 TABLET BY MOUTH TWICE A DAY 180 tablet 1  . glucose blood test strip 1 each by Other route daily. Use daily for glucose control - Dx 250.00 100 each 0  . hydrochlorothiazide (MICROZIDE) 12.5 MG capsule TAKE 1 CAPSULE BY MOUTH EVERY DAY 90 capsule 0  . lisinopril (ZESTRIL) 40 MG tablet TAKE 1 AND 1/2 TABLET BY MOUTH EVERY DAY 135 tablet 1  . metFORMIN (GLUCOPHAGE) 1000 MG tablet Take 1 tablet (1,000 mg total) by mouth 2 (two) times daily with a meal. 180 tablet 1  . propranolol (INDERAL) 10 MG tablet Take 1 tablet (10 mg total) by mouth 4 (four) times daily as needed (palpitations, irregular or fast heart rate). 90 tablet 3  . sildenafil (REVATIO) 20 MG tablet TAKE 1 TO 2 TABLETS 2 TO 3 HOURS BEFORE SEX 30 tablet 11  . TRULICITY 1.5 0000000 SOPN INJECT 1.5 MG INTO THE SKIN ONCE A WEEK.  4 pen 11   No current facility-administered medications on file prior to visit.   No Known Allergies Family History  Problem Relation Age of Onset  . Aortic aneurysm Other        family history  . Cancer Other        Breast (1st degree relative)  . Pancreatic cancer Mother 1  . Colon cancer Neg Hx   . Rectal cancer Neg Hx   . Stomach cancer Neg Hx   . Esophageal cancer Neg Hx     PE: BP 128/80   Pulse 90   Ht  6\' 2"  (1.88 m)   Wt 215 lb (97.5 kg)   SpO2 98%   BMI 27.60 kg/m  Wt Readings from Last 3 Encounters:  11/26/19 215 lb (97.5 kg)  11/25/19 214 lb 3.2 oz (97.2 kg)  10/28/19 217 lb 12.8 oz (98.8 kg)   Constitutional: overweight, in NAD Eyes: PERRLA, EOMI, no exophthalmos ENT: moist mucous membranes, no thyromegaly, no cervical lymphadenopathy Cardiovascular: RRR, No MRG Respiratory: CTA B Gastrointestinal: abdomen soft, NT, ND, BS+ Musculoskeletal: no deformities, strength intact in all 4 Skin: moist, warm, no rashes Neurological: no tremor with outstretched hands, DTR normal in all 4  ASSESSMENT: 1. DM2, insulin-dependent, uncontrolled, with complications - PN - ED  2. HL  3.  Overweight  PLAN:  1. Patient with longstanding, previously uncontrolled type 2 diabetes, on oral antidiabetic regimen with Metformin, sulfonylurea, and also weekly GLP-1 receptor agonist.  Sugars were higher in the morning so we moved the entire metformin dose at night with good results.  He continues around his diet, which is greatly helping.  At last visit, sugars are better overall except for dietary indiscretions.  We did not change his regimen at that time.  HbA1c was very good, at 6.7% then. -At this visit, sugars are still very good with the exception of occasional blood sugars above target in the morning.  He usually has an idea why the sugars are elevated and continues to adjust his diet and do different dietary experiments the night before to improve his morning sugars.   Sugars later in the day are at goal.  For now, I would not suggest to change the regimen.  He did not use glipizide since last visit and we again discussed about situations in which he can use it.  He was taking it after a large meal and I advised him to take it before, to counteract the hyperglycemic peak that can follow such a meal. - I suggested to:  Patient Instructions  Please continue: - Trulicity 1.5 mg weekly. - Metformin 2000 mg with dinner (- Glipizide 5 mg before a large dinner)  Please return in 4 months with your sugar log.   - we checked his HbA1c: 7.0% (higher) - advised to check sugars at different times of the day - 1x a day, rotating check times - advised for yearly eye exams >> he is UTD - return to clinic in 4 months  2.  HL -Reviewed latest lipid panel from 2019: LDL, HDL low Lab Results  Component Value Date   CHOL 140 07/23/2018   HDL 32.40 (L) 07/23/2018   LDLCALC 68 07/23/2018   LDLDIRECT 77.0 07/07/2015   TRIG 198.0 (H) 07/23/2018   CHOLHDL 4 07/23/2018  -Continues Lipitor and CoQ10 without side effects  3.  Overweight -Before last visit, he lost approximately 26 pounds after starting a GLP-1 receptor agonist switching to a more plant-based diet.  Stable weight now -We will continue with Trulicity, which should also help with weight loss  [name], MD PhD [facility]

## 2019-12-09 ENCOUNTER — Encounter: Payer: Self-pay | Admitting: Internal Medicine

## 2019-12-23 ENCOUNTER — Ambulatory Visit: Payer: Medicare HMO | Admitting: Family Medicine

## 2019-12-31 ENCOUNTER — Other Ambulatory Visit: Payer: Self-pay | Admitting: Internal Medicine

## 2019-12-31 DIAGNOSIS — E785 Hyperlipidemia, unspecified: Secondary | ICD-10-CM

## 2020-01-24 ENCOUNTER — Ambulatory Visit: Payer: Medicare HMO | Attending: Internal Medicine

## 2020-01-24 DIAGNOSIS — Z23 Encounter for immunization: Secondary | ICD-10-CM

## 2020-01-24 NOTE — Progress Notes (Signed)
   Covid-19 Vaccination Clinic  Name:  Robert [name]    MRN: [id number] DOB: Mar 16, 1951  01/24/2020  [name] was observed post Covid-19 immunization for 15 minutes without incident. He was provided with Vaccine Information Sheet and instruction to access the V-Safe system.   [name] was instructed to call 911 with any severe reactions post vaccine: [name] Kitchen Difficulty breathing  . Swelling of face and throat  . A fast heartbeat  . A bad rash all over body  . Dizziness and weakness   Immunizations Administered    Name Date Dose VIS Date Route   Pfizer COVID-19 Vaccine 01/24/2020 10:38 AM 0.3 mL 10/04/2019 Intramuscular   Manufacturer: [employer], [employer]   Lot: [id number]   [place]: [id number]

## 2020-01-25 ENCOUNTER — Other Ambulatory Visit: Payer: Self-pay | Admitting: Internal Medicine

## 2020-01-25 DIAGNOSIS — I10 Essential (primary) hypertension: Secondary | ICD-10-CM

## 2020-01-30 ENCOUNTER — Encounter: Payer: Self-pay | Admitting: Internal Medicine

## 2020-02-03 DIAGNOSIS — D0439 Carcinoma in situ of skin of other parts of face: Secondary | ICD-10-CM | POA: Diagnosis not present

## 2020-02-03 DIAGNOSIS — L720 Epidermal cyst: Secondary | ICD-10-CM | POA: Diagnosis not present

## 2020-02-03 DIAGNOSIS — B070 Plantar wart: Secondary | ICD-10-CM | POA: Diagnosis not present

## 2020-02-05 ENCOUNTER — Other Ambulatory Visit: Payer: Self-pay

## 2020-02-06 ENCOUNTER — Ambulatory Visit (INDEPENDENT_AMBULATORY_CARE_PROVIDER_SITE_OTHER): Payer: Medicare HMO | Admitting: Internal Medicine

## 2020-02-06 ENCOUNTER — Encounter: Payer: Self-pay | Admitting: Internal Medicine

## 2020-02-06 VITALS — BP 120/70 | HR 80 | Temp 97.1°F | Ht 74.0 in | Wt 210.7 lb

## 2020-02-06 DIAGNOSIS — R351 Nocturia: Secondary | ICD-10-CM

## 2020-02-06 DIAGNOSIS — Z0000 Encounter for general adult medical examination without abnormal findings: Secondary | ICD-10-CM | POA: Diagnosis not present

## 2020-02-06 DIAGNOSIS — E119 Type 2 diabetes mellitus without complications: Secondary | ICD-10-CM | POA: Diagnosis not present

## 2020-02-06 DIAGNOSIS — I10 Essential (primary) hypertension: Secondary | ICD-10-CM | POA: Diagnosis not present

## 2020-02-06 DIAGNOSIS — I480 Paroxysmal atrial fibrillation: Secondary | ICD-10-CM

## 2020-02-06 DIAGNOSIS — E785 Hyperlipidemia, unspecified: Secondary | ICD-10-CM | POA: Diagnosis not present

## 2020-02-06 DIAGNOSIS — N401 Enlarged prostate with lower urinary tract symptoms: Secondary | ICD-10-CM

## 2020-02-06 LAB — CBC WITH DIFFERENTIAL/PLATELET
Basophils Absolute: 0.1 10*3/uL (ref 0.0–0.1)
Basophils Relative: 0.5 % (ref 0.0–3.0)
Eosinophils Absolute: 0.1 10*3/uL (ref 0.0–0.7)
Eosinophils Relative: 0.8 % (ref 0.0–5.0)
HCT: 47.8 % (ref 39.0–52.0)
Hemoglobin: 16.6 g/dL (ref 13.0–17.0)
Lymphocytes Relative: 29.2 % (ref 12.0–46.0)
Lymphs Abs: 3.1 10*3/uL (ref 0.7–4.0)
MCHC: 34.8 g/dL (ref 30.0–36.0)
MCV: 92.7 fl (ref 78.0–100.0)
Monocytes Absolute: 0.9 10*3/uL (ref 0.1–1.0)
Monocytes Relative: 8.8 % (ref 3.0–12.0)
Neutro Abs: 6.5 10*3/uL (ref 1.4–7.7)
Neutrophils Relative %: 60.7 % (ref 43.0–77.0)
Platelets: 194 10*3/uL (ref 150.0–400.0)
RBC: 5.15 Mil/uL (ref 4.22–5.81)
RDW: 13.8 % (ref 11.5–15.5)
WBC: 10.7 10*3/uL — ABNORMAL HIGH (ref 4.0–10.5)

## 2020-02-06 LAB — COMPREHENSIVE METABOLIC PANEL
ALT: 12 U/L (ref 0–53)
AST: 13 U/L (ref 0–37)
Albumin: 4.4 g/dL (ref 3.5–5.2)
Alkaline Phosphatase: 68 U/L (ref 39–117)
BUN: 15 mg/dL (ref 6–23)
CO2: 29 mEq/L (ref 19–32)
Calcium: 9.3 mg/dL (ref 8.4–10.5)
Chloride: 99 mEq/L (ref 96–112)
Creatinine, Ser: 0.84 mg/dL (ref 0.40–1.50)
GFR: 90.48 mL/min (ref >60.00)
Glucose, Bld: 131 mg/dL — ABNORMAL HIGH (ref 70–99)
Potassium: 5.1 mEq/L (ref 3.5–5.1)
Sodium: 134 mEq/L — ABNORMAL LOW (ref 135–145)
Total Bilirubin: 0.7 mg/dL (ref 0.2–1.2)
Total Protein: 6.6 g/dL (ref 6.0–8.3)

## 2020-02-06 LAB — LIPID PANEL
Cholesterol: 101 mg/dL (ref 0–200)
HDL: 38 mg/dL — ABNORMAL LOW (ref >39.00)
LDL Cholesterol: 45 mg/dL (ref 0–99)
NonHDL: 62.66
Total CHOL/HDL Ratio: 3
Triglycerides: 86 mg/dL (ref 0.0–149.0)
VLDL: 17.2 mg/dL (ref 0.0–40.0)

## 2020-02-06 LAB — VITAMIN B12: Vitamin B-12: 225 pg/mL (ref 211–911)

## 2020-02-06 LAB — VITAMIN D 25 HYDROXY (VIT D DEFICIENCY, FRACTURES): VITD: 35.73 ng/mL (ref 30.00–100.00)

## 2020-02-06 LAB — TSH: TSH: 1.08 u[IU]/mL (ref 0.35–4.50)

## 2020-02-06 NOTE — Patient Instructions (Signed)
-Nice seeing you today!!  -Lab work today; will notify you once results are available.  -Remember your tetanus booster and shingles vaccines at your pharmacy 6 weeks after your second COVID vaccine.  -Schedule follow up in 6 months.   Preventive Care 25 Years and Older, Male Preventive care refers to lifestyle choices and visits with your health care provider that can promote health and wellness. This includes:  A yearly physical exam. This is also called an annual well check.  Regular dental and eye exams.  Immunizations.  Screening for certain conditions.  Healthy lifestyle choices, such as diet and exercise. What can I expect for my preventive care visit? Physical exam Your health care provider will check:  Height and weight. These may be used to calculate body mass index (BMI), which is a measurement that tells if you are at a healthy weight.  Heart rate and blood pressure.  Your skin for abnormal spots. Counseling Your health care provider may ask you questions about:  Alcohol, tobacco, and drug use.  Emotional well-being.  Home and relationship well-being.  Sexual activity.  Eating habits.  History of falls.  Memory and ability to understand (cognition).  Work and work Statistician. What immunizations do I need?  Influenza (flu) vaccine  This is recommended every year. Tetanus, diphtheria, and pertussis (Tdap) vaccine  You may need a Td booster every 10 years. Varicella (chickenpox) vaccine  You may need this vaccine if you have not already been vaccinated. Zoster (shingles) vaccine  You may need this after age 53. Pneumococcal conjugate (PCV13) vaccine  One dose is recommended after age 84. Pneumococcal polysaccharide (PPSV23) vaccine  One dose is recommended after age 15. Measles, mumps, and rubella (MMR) vaccine  You may need at least one dose of MMR if you were born in 1957 or later. You may also need a second dose. Meningococcal  conjugate (MenACWY) vaccine  You may need this if you have certain conditions. Hepatitis A vaccine  You may need this if you have certain conditions or if you travel or work in places where you may be exposed to hepatitis A. Hepatitis B vaccine  You may need this if you have certain conditions or if you travel or work in places where you may be exposed to hepatitis B. Haemophilus influenzae type b (Hib) vaccine  You may need this if you have certain conditions. You may receive vaccines as individual doses or as more than one vaccine together in one shot (combination vaccines). Talk with your health care provider about the risks and benefits of combination vaccines. What tests do I need? Blood tests  Lipid and cholesterol levels. These may be checked every 5 years, or more frequently depending on your overall health.  Hepatitis C test.  Hepatitis B test. Screening  Lung cancer screening. You may have this screening every year starting at age 68 if you have a 30-pack-year history of smoking and currently smoke or have quit within the past 15 years.  Colorectal cancer screening. All adults should have this screening starting at age 65 and continuing until age 36. Your health care provider may recommend screening at age 48 if you are at increased risk. You will have tests every 1-10 years, depending on your results and the type of screening test.  Prostate cancer screening. Recommendations will vary depending on your family history and other risks.  Diabetes screening. This is done by checking your blood sugar (glucose) after you have not eaten for a while (fasting).  You may have this done every 1-3 years.  Abdominal aortic aneurysm (AAA) screening. You may need this if you are a current or former smoker.  Sexually transmitted disease (STD) testing. Follow these instructions at home: Eating and drinking  Eat a diet that includes fresh fruits and vegetables, whole grains, lean  protein, and low-fat dairy products. Limit your intake of foods with high amounts of sugar, saturated fats, and salt.  Take vitamin and mineral supplements as recommended by your health care provider.  Do not drink alcohol if your health care provider tells you not to drink.  If you drink alcohol: ? Limit how much you have to 0-2 drinks a day. ? Be aware of how much alcohol is in your drink. In the [place] one drink equals one 12 oz bottle of beer (355 mL), one 5 oz glass of wine (148 mL), or one 1 oz glass of hard liquor (44 mL). Lifestyle  Take daily care of your teeth and gums.  Stay active. Exercise for at least 30 minutes on 5 or more days each week.  Do not use any products that contain nicotine or tobacco, such as cigarettes, e-cigarettes, and chewing tobacco. If you need help quitting, ask your health care provider.  If you are sexually active, practice safe sex. Use a condom or other form of protection to prevent STIs (sexually transmitted infections).  Talk with your health care provider about taking a low-dose aspirin or statin. What's next?  Visit your health care provider once a year for a well check visit.  Ask your health care provider how often you should have your eyes and teeth checked.  Stay up to date on all vaccines. This information is not intended to replace advice given to you by your health care provider. Make sure you discuss any questions you have with your health care provider. Document Revised: 10/04/2018 Document Reviewed: 10/04/2018 Elsevier Patient Education  2020 [employer].

## 2020-02-06 NOTE — Progress Notes (Signed)
Established Patient Office Visit     This visit occurred during the SARS-CoV-2 public health emergency.  Safety protocols were in place, including screening questions prior to the visit, additional usage of staff PPE, and extensive cleaning of exam room while observing appropriate contact time as indicated for disinfecting solutions.    CC/Reason for Visit: Annual preventive exam and subsequent Medicare wellness visit  HPI: Robert [name] is a 69 y.o. male who is coming in today for the above mentioned reasons. Past Medical History is significant for:   1.  Hyperlipidemia on a statin of which he needs refills today.  2.  Hypertension that has been well controlled.  3.  Type 2 diabetes followed by endocrinology on Trulicity, metformin and glipizide.  4.  Atrial fibrillation anticoagulated on Eliquis followed by Dr. Acie Fredrickson.  He unfortunately suffered the death of his brother in 2022-12-11 and 3 weeks later from multiple myeloma.  He feels like he is doing "okay" in regards to his mood, has ups and downs.  He received his first Covid vaccine.  He has routine eye and dental care, has not noticed issues with his hearing, exercises routinely.    Past Medical/Surgical History: Past Medical History:  Diagnosis Date  . Diabetes mellitus type [name]   . HLD (hyperlipidemia)   . HTN (hypertension)   . Hx of colonic polyps   . Paroxysmal atrial fibrillation ([place]) 03/12/2019  . Skin cancer    history of    Past Surgical History:  Procedure Laterality Date  . COLONOSCOPY    . PATELLAR TENDON REPAIR     Left  . Removal of excess tissue     Right and Left upper eyelids    Social History:  reports that he quit smoking about 8 years ago. His smoking use included cigars. He has never used smokeless tobacco. He reports current alcohol use. He reports that he does not use drugs.  Allergies: No Known Allergies  Family History:  Family History  Problem Relation Age of Onset    . Aortic aneurysm Other        family history  . Cancer Other        Breast (1st degree relative)  . Pancreatic cancer Mother 57  . Colon cancer Neg Hx   . Rectal cancer Neg Hx   . Stomach cancer Neg Hx   . Esophageal cancer Neg Hx      Current Outpatient Medications:  .  atorvastatin (LIPITOR) 40 MG tablet, TAKE 1 TABLET BY MOUTH EVERY DAY, Disp: 90 tablet, Rfl: 0 .  Coenzyme Q10 (COQ10 PO), Take 1 capsule by mouth daily. , Disp: , Rfl:  .  ELIQUIS 5 MG TABS tablet, TAKE 1 TABLET BY MOUTH TWICE A DAY, Disp: 180 tablet, Rfl: 1 .  glucose blood test strip, 1 each by Other route daily. Use daily for glucose control - Dx 250.00, Disp: 100 each, Rfl: 0 .  hydrochlorothiazide (MICROZIDE) 12.5 MG capsule, TAKE 1 CAPSULE BY MOUTH EVERY DAY, Disp: 90 capsule, Rfl: 0 .  lisinopril (ZESTRIL) 40 MG tablet, TAKE 1 AND 1/2 TABLET BY MOUTH EVERY DAY, Disp: 135 tablet, Rfl: 1 .  metFORMIN (GLUCOPHAGE) 1000 MG tablet, Take 1 tablet (1,000 mg total) by mouth 2 (two) times daily with a meal., Disp: 180 tablet, Rfl: 1 .  propranolol (INDERAL) 10 MG tablet, Take 1 tablet (10 mg total) by mouth 4 (four) times daily as needed (palpitations, irregular or fast heart rate)., Disp:  90 tablet, Rfl: 3 .  sildenafil (REVATIO) 20 MG tablet, TAKE 1 TO 2 TABLETS 2 TO 3 HOURS BEFORE SEX, Disp: 30 tablet, Rfl: 11 .  TRULICITY 1.5 OM/7.6HM SOPN, INJECT 1.5 MG INTO THE SKIN ONCE A WEEK., Disp: 4 pen, Rfl: 11  Review of Systems:  Constitutional: Denies fever, chills, diaphoresis, appetite change and fatigue.  HEENT: Denies photophobia, eye pain, redness, hearing loss, ear pain, congestion, sore throat, rhinorrhea, sneezing, mouth sores, trouble swallowing, neck pain, neck stiffness and tinnitus.   Respiratory: Denies SOB, DOE, cough, chest tightness,  and wheezing.   Cardiovascular: Denies chest pain, palpitations and leg swelling.  Gastrointestinal: Denies nausea, vomiting, abdominal pain, diarrhea, constipation, blood  in stool and abdominal distention.  Genitourinary: Denies dysuria, urgency, frequency, hematuria, flank pain and difficulty urinating.  Endocrine: Denies: hot or cold intolerance, sweats, changes in hair or nails, polyuria, polydipsia. Musculoskeletal: Denies myalgias, back pain, joint swelling, arthralgias and gait problem.  Skin: Denies pallor, rash and wound.  Neurological: Denies dizziness, seizures, syncope, weakness, light-headedness, numbness and headaches.  Hematological: Denies adenopathy. Easy bruising, personal or family bleeding history  Psychiatric/Behavioral: Denies suicidal ideation, mood changes, confusion, nervousness, sleep disturbance and agitation    Physical Exam: Vitals:   02/06/20 0926  BP: 120/70  Pulse: 80  Temp: (!) 97.1 F (36.2 C)  TempSrc: Temporal  SpO2: 97%  Weight: 210 lb 11.2 oz (95.6 kg)  Height: _0  (1.88 m)    Body mass index is 27.05 kg/m.   Constitutional: NAD, calm, comfortable Eyes: PERRL, lids and conjunctivae normal ENMT: Mucous membranes are moist. Tympanic membrane is pearly white, no erythema or bulging. Neck: normal, supple, no masses, no thyromegaly Respiratory: clear to auscultation bilaterally, no wheezing, no crackles. Normal respiratory effort. No accessory muscle use.  Cardiovascular: Regular rate and rhythm, no murmurs / rubs / gallops. No extremity edema. 2+ pedal pulses. No carotid bruits.  Abdomen: no tenderness, no masses palpated. No hepatosplenomegaly. Bowel sounds positive.  Musculoskeletal: no clubbing / cyanosis. No joint deformity upper and lower extremities. Good ROM, no contractures. Normal muscle tone.  Skin: no rashes, lesions, ulcers. No induration Neurologic: CN 2-12 grossly intact. Sensation intact, DTR normal. Strength 5/5 in all 4.  Psychiatric: Normal judgment and insight. Alert and oriented x 3. Normal mood.    Subsequent Medicare wellness visit   1. Risk factors, based on past  M,S,F  -cardiovascular disease risk factors include age, gender, history of diabetes, history of hypertension, history of hyperlipidemia   2.  Physical activities: Used to be more active prior to the pandemic, now does mainly yard work and some mild walking.   3.  Depression/mood:  Mood has been somewhat depressed after the deaths of 2 close family members.   4.  Hearing:  No perceived issues   5.  ADL's: Independent in all ADLs   6.  Fall risk:  Low fall risk   7.  Home safety: No problems identified   8.  Height weight, and visual acuity: Height and weight as above, visual acuity is 20/25 with eyes independently and together   9.  Counseling:  Advised to increase physical activity   10. Lab orders based on risk factors: Laboratory update will be reviewed   11. Referral :  None today   12. Care plan:  Follow-up with me in 6 months   13. Cognitive assessment:  No cognitive impairment   14. Screening: Patient provided with a written and personalized 5-10 year screening schedule  in the AVS.   yes   15. Provider List Update:   PCP, GI, Dr. Carlean Purl  16. Advance Directives: Full code     Office Visit from 02/06/2020 in [facility]  PHQ-9 Total Score  5      Fall Risk  02/06/2020 07/23/2018 05/28/2018 03/01/2017 11/05/2015  Falls in the past year? 0 No No No No  Comment - - Emmi Telephone Survey: data to providers prior to load - -  Number falls in past yr: 0 - - - -  Injury with Fall? 0 - - - -     Impression and Plan:  Encounter for preventive health examination -He has routine eye and dental care. -He received his first Covid vaccine, is due for tetanus and shingles series but will wait 6 weeks after second Covid vaccine. -Screening labs today. -Healthy lifestyle has been discussed in detail. -He had a colonoscopy in 2020 and is a 3-year follow-up. -Check PSA today.  Paroxysmal atrial fibrillation (HCC) -Rate controlled, anticoagulated on Eliquis.  Type 2  diabetes mellitus without complication, without long-term current use of insulin (HCC) -Follows with endocrinology, A1c was 7 in February.  BPH associated with nocturia -Fairly well controlled.  Hyperlipidemia, unspecified hyperlipidemia type  - Plan: Lipid panel, Lipid panel -LDL was  68 in September 2019.  Essential hypertension  -Well-controlled on current regimen.   Patient Instructions  -Nice seeing you today!!  -Lab work today; will notify you once results are available.  -Remember your tetanus booster and shingles vaccines at your pharmacy 6 weeks after your second COVID vaccine.  -Schedule follow up in 6 months.   Preventive Care 24 Years and Older, Male Preventive care refers to lifestyle choices and visits with your health care provider that can promote health and wellness. This includes:  A yearly physical exam. This is also called an annual well check.  Regular dental and eye exams.  Immunizations.  Screening for certain conditions.  Healthy lifestyle choices, such as diet and exercise. What can I expect for my preventive care visit? Physical exam Your health care provider will check:  Height and weight. These may be used to calculate body mass index (BMI), which is a measurement that tells if you are at a healthy weight.  Heart rate and blood pressure.  Your skin for abnormal spots. Counseling Your health care provider may ask you questions about:  Alcohol, tobacco, and drug use.  Emotional well-being.  Home and relationship well-being.  Sexual activity.  Eating habits.  History of falls.  Memory and ability to understand (cognition).  Work and work Statistician. What immunizations do I need?  Influenza (flu) vaccine  This is recommended every year. Tetanus, diphtheria, and pertussis (Tdap) vaccine  You may need a Td booster every 10 years. Varicella (chickenpox) vaccine  You may need this vaccine if you have not already been  vaccinated. Zoster (shingles) vaccine  You may need this after age 14. Pneumococcal conjugate (PCV13) vaccine  One dose is recommended after age 70. Pneumococcal polysaccharide (PPSV23) vaccine  One dose is recommended after age 41. Measles, mumps, and rubella (MMR) vaccine  You may need at least one dose of MMR if you were born in 1957 or later. You may also need a second dose. Meningococcal conjugate (MenACWY) vaccine  You may need this if you have certain conditions. Hepatitis A vaccine  You may need this if you have certain conditions or if you travel or work in places where you may  be exposed to hepatitis A. Hepatitis B vaccine  You may need this if you have certain conditions or if you travel or work in places where you may be exposed to hepatitis B. Haemophilus influenzae type b (Hib) vaccine  You may need this if you have certain conditions. You may receive vaccines as individual doses or as more than one vaccine together in one shot (combination vaccines). Talk with your health care provider about the risks and benefits of combination vaccines. What tests do I need? Blood tests  Lipid and cholesterol levels. These may be checked every 5 years, or more frequently depending on your overall health.  Hepatitis C test.  Hepatitis B test. Screening  Lung cancer screening. You may have this screening every year starting at age 43 if you have a 30-pack-year history of smoking and currently smoke or have quit within the past 15 years.  Colorectal cancer screening. All adults should have this screening starting at age 32 and continuing until age 60. Your health care provider may recommend screening at age 9 if you are at increased risk. You will have tests every 1-10 years, depending on your results and the type of screening test.  Prostate cancer screening. Recommendations will vary depending on your family history and other risks.  Diabetes screening. This is done by  checking your blood sugar (glucose) after you have not eaten for a while (fasting). You may have this done every 1-3 years.  Abdominal aortic aneurysm (AAA) screening. You may need this if you are a current or former smoker.  Sexually transmitted disease (STD) testing. Follow these instructions at home: Eating and drinking  Eat a diet that includes fresh fruits and vegetables, whole grains, lean protein, and low-fat dairy products. Limit your intake of foods with high amounts of sugar, saturated fats, and salt.  Take vitamin and mineral supplements as recommended by your health care provider.  Do not drink alcohol if your health care provider tells you not to drink.  If you drink alcohol: ? Limit how much you have to 0-2 drinks a day. ? Be aware of how much alcohol is in your drink. In the [place] one drink equals one 12 oz bottle of beer (355 mL), one 5 oz glass of wine (148 mL), or one 1 oz glass of hard liquor (44 mL). Lifestyle  Take daily care of your teeth and gums.  Stay active. Exercise for at least 30 minutes on 5 or more days each week.  Do not use any products that contain nicotine or tobacco, such as cigarettes, e-cigarettes, and chewing tobacco. If you need help quitting, ask your health care provider.  If you are sexually active, practice safe sex. Use a condom or other form of protection to prevent STIs (sexually transmitted infections).  Talk with your health care provider about taking a low-dose aspirin or statin. What's next?  Visit your health care provider once a year for a well check visit.  Ask your health care provider how often you should have your eyes and teeth checked.  Stay up to date on all vaccines. This information is not intended to replace advice given to you by your health care provider. Make sure you discuss any questions you have with your health care provider. Document Revised: 10/04/2018 Document Reviewed: 10/04/2018 Elsevier Patient Education   2020 [place], MD [facility]

## 2020-02-13 ENCOUNTER — Other Ambulatory Visit: Payer: Self-pay | Admitting: Internal Medicine

## 2020-02-13 DIAGNOSIS — E119 Type 2 diabetes mellitus without complications: Secondary | ICD-10-CM

## 2020-02-17 ENCOUNTER — Ambulatory Visit: Payer: Medicare HMO | Attending: Internal Medicine

## 2020-02-17 DIAGNOSIS — Z23 Encounter for immunization: Secondary | ICD-10-CM

## 2020-02-17 NOTE — Progress Notes (Signed)
   Covid-19 Vaccination Clinic  Name:  Robert [name]    MRN: [id number] DOB: 12/22/1950  02/17/2020  Robert [name] was observed post Covid-19 immunization for 15 minutes without incident. He was provided with Vaccine Information Sheet and instruction to access the V-Safe system.   Robert [name] was instructed to call 911 with any severe reactions post vaccine: [name] Kitchen Difficulty breathing  . Swelling of face and throat  . A fast heartbeat  . A bad rash all over body  . Dizziness and weakness   Immunizations Administered    Name Date Dose VIS Date Route   Pfizer COVID-19 Vaccine 02/17/2020 12:25 PM 0.3 mL 12/18/2018 Intramuscular   Manufacturer: [place]   Lot: [id number]   [place]: [id number]

## 2020-03-05 ENCOUNTER — Telehealth: Payer: Self-pay | Admitting: Internal Medicine

## 2020-03-05 DIAGNOSIS — E785 Hyperlipidemia, unspecified: Secondary | ICD-10-CM

## 2020-03-05 DIAGNOSIS — I480 Paroxysmal atrial fibrillation: Secondary | ICD-10-CM

## 2020-03-05 DIAGNOSIS — N401 Enlarged prostate with lower urinary tract symptoms: Secondary | ICD-10-CM

## 2020-03-05 DIAGNOSIS — I10 Essential (primary) hypertension: Secondary | ICD-10-CM

## 2020-03-05 DIAGNOSIS — E119 Type 2 diabetes mellitus without complications: Secondary | ICD-10-CM

## 2020-03-05 NOTE — Chronic Care Management (AMB) (Signed)
  Chronic Care Management   Outreach Note  03/05/2020 Name: Robert [name] MRN: [id number] DOB: [birth year]  Referred by: Isaac Bliss, Rayford Halsted, MD Reason for referral : Chronic Care Management   An unsuccessful telephone outreach was attempted today. The patient was referred to the pharmacist for assistance with care management and care coordination.   Follow Up Plan:   [place]

## 2020-03-05 NOTE — Chronic Care Management (AMB) (Signed)
  Chronic Care Management   Note  03/05/2020 Name: POUL WOOLRIDGE MRN: [id number] DOB: 16-Mar-1951  Lovina Reach II is a 69 y.o. year old male who is a primary care patient of Isaac Bliss, Rayford Halsted, MD. I reached out to [place] by phone today in response to a referral sent by Mr. Lovina Reach II's PCP, Isaac Bliss, Rayford Halsted, MD.   Mr. Tavizon was given information about Chronic Care Management services today including:  1. CCM service includes personalized support from designated clinical staff supervised by his physician, including individualized plan of care and coordination with other care providers 2. 24/7 contact phone numbers for assistance for urgent and routine care needs. 3. Service will only be billed when office clinical staff spend 20 minutes or more in a month to coordinate care. 4. Only one practitioner may furnish and bill the service in a calendar month. 5. The patient may stop CCM services at any time (effective at the end of the month) by phone call to the office staff.   Patient agreed to services and verbal consent obtained.   Follow up plan:   [place]

## 2020-03-22 ENCOUNTER — Other Ambulatory Visit: Payer: Self-pay | Admitting: Internal Medicine

## 2020-03-22 DIAGNOSIS — E785 Hyperlipidemia, unspecified: Secondary | ICD-10-CM

## 2020-03-26 ENCOUNTER — Other Ambulatory Visit: Payer: Self-pay

## 2020-03-26 ENCOUNTER — Ambulatory Visit: Payer: Medicare HMO | Admitting: Internal Medicine

## 2020-03-26 ENCOUNTER — Encounter: Payer: Self-pay | Admitting: Internal Medicine

## 2020-03-26 VITALS — BP 142/80 | HR 87 | Ht 74.0 in | Wt 204.0 lb

## 2020-03-26 DIAGNOSIS — E663 Overweight: Secondary | ICD-10-CM

## 2020-03-26 DIAGNOSIS — E119 Type 2 diabetes mellitus without complications: Secondary | ICD-10-CM | POA: Diagnosis not present

## 2020-03-26 DIAGNOSIS — E785 Hyperlipidemia, unspecified: Secondary | ICD-10-CM | POA: Diagnosis not present

## 2020-03-26 LAB — POCT GLYCOSYLATED HEMOGLOBIN (HGB A1C): Hemoglobin A1C: 6.4 % — AB (ref 4.0–5.6)

## 2020-03-26 NOTE — Progress Notes (Signed)
Patient ID: Robert [name], male   DOB: 02/23/1951, 69 y.o.   MRN: [id number]   This visit occurred during the SARS-CoV-2 public health emergency.  Safety protocols were in place, including screening questions prior to the visit, additional usage of staff PPE, and extensive cleaning of exam room while observing appropriate contact time as indicated for disinfecting solutions.   HPI: Robert [name] is a 69 y.o.-year-old male, returning for follow-up for  DM2, dx in 2000s, insulin-dependent since 2018, uncontrolled, with complications (PN, ED).  Last visit 4 months ago.  He continues on a more plant-based diet. He finds it is easy to follow and sees great results with it.  He had a difficult time since last visit.  His younger brother (A fib) and younger son (multiple myeloma) passed away since last OV.  Reviewed HbA1c levels: Lab Results  Component Value Date   HGBA1C 7.0 (A) 11/26/2019   HGBA1C 6.7 (A) 07/30/2019   HGBA1C 7.1 (A) 01/03/2019   Pt is on a regimen of: - Trulicity 1.5 mg weekly. - Metformin 1000 mg 2x a day >> 2000 mg with dinner - Glipizide 5 mg before a large dinner - added 12/2018 >> did not have to use it in few months We stopped Novolog 70/30 10 units 2x a day in 11/2017, when starting Trulicity.  Pt checks his sugars 1-4 times a day: - am:  116-166 (170s - Holidays) >> 109-139, 142, 186 - 2h after b'fast: 106-167, 217 >> 150 >> 130, 139 >> 134 >> n/c - before lunch: 107, 140 >> 106,138, 194 (PB) >> n/c - 2h after lunch:  93-161 >> 130-204, 224  >> n/c >> 150 - before dinner:  97-156, 249 >> 96-125, 144, 199 (icecream) >> 110 - 2h after dinner: 112-206, 224 >> 103-155, 187 >> 97-121 - bedtime: 83-143 >> 113, 196 >> 83-187 >> 85-149 >> 82-128, 140 - nighttime:51/62, 80-126 >> 129 >> 91 (wine) >> 118 >> n/c Lowest sugar was 28 right after starting insulin >> 77 >> 51 (before stopping Glipizide at night) ...83 >> 94 >> 82; he has hypoglycemia awareness in  the 70. Highest sugar was 224 >> 199 >> 186.  Glucometer: ReliOn  Pt's meals are: - Breakfast: whole wheat toast + butter, 1x a weeK: eggs, cheese - Lunch: tuna salad or tuna sandwich ([employer] delivery), soft taco, meat + baked beans or potatoes - Dinner: [employer] delivery - Snacks: 1-0: nuts, bananas, other fruit He decreased his alcohol intake. He was swimming 5 times a week before the coronavirus pandemic, now exercising at home.  -No CKD; last BUN/creatinine:  Lab Results  Component Value Date   BUN 15 02/06/2020   BUN 14 10/21/2019   CREATININE 0.84 02/06/2020   CREATININE 0.88 10/21/2019  On lisinopril 40. -+ HL; last set of lipids: Lab Results  Component Value Date   CHOL 101 02/06/2020   HDL 38.00 (L) 02/06/2020   LDLCALC 45 02/06/2020   LDLDIRECT 77.0 07/07/2015   TRIG 86.0 02/06/2020   CHOLHDL 3 02/06/2020  On Lipitor 40 mg every day + co-Q10 - last eye exam was 09/2019: No DR, + cataract -+ numbness and tingling in his feet  He has a h/o arrhythmia (A fib) - wore an event monitor >> now on beta blocker. Cardiologist: Dr. Acie Fredrickson.  ROS: Constitutional: no weight gain/+ weight loss, no fatigue, no subjective hyperthermia, no subjective hypothermia Eyes: no blurry vision, no xerophthalmia ENT: no sore throat, no  nodules palpated in neck, no dysphagia, no odynophagia, no hoarseness Cardiovascular: no CP/no SOB/no palpitations/no leg swelling Respiratory: no cough/no SOB/no wheezing Gastrointestinal: no N/no V/no D/no C/no acid reflux Musculoskeletal: no muscle aches/+ joint aches Skin: no rashes, no hair loss Neurological: no tremors/+ numbness/+ tingling/no dizziness  I reviewed pt's medications, allergies, PMH, social hx, family hx, and changes were documented in the history of present illness. Otherwise, unchanged from my initial visit note.  Past Medical History:  Diagnosis Date  . Diabetes mellitus type [name]   . HLD (hyperlipidemia)   . HTN  (hypertension)   . Hx of colonic polyps   . Paroxysmal atrial fibrillation ([place]) 03/12/2019  . Skin cancer    history of   Past Surgical History:  Procedure Laterality Date  . COLONOSCOPY    . PATELLAR TENDON REPAIR     Left  . Removal of excess tissue     Right and Left upper eyelids   Social History   Socioeconomic History  . Marital status: Married    Spouse name: Not on file  . Number of children: 2  Social Needs  Occupational History  . Occupation: DVD's  . Occupation: Cabin crew: Self Employed, retired  Tobacco Use  . Smoking status: Former Smoker    Types: Cigars    Last attempt to quit: 02/22/2011  . Smokeless tobacco: Never Used  . Tobacco comment: 2 cigars monthly or less  Substance and Sexual Activity  . Alcohol use: Yes     0.0 oz    Vodka, 2-3 drinks 3x a week   Drugs: No   Current Outpatient Medications on File Prior to Visit  Medication Sig Dispense Refill  . atorvastatin (LIPITOR) 40 MG tablet TAKE 1 TABLET BY MOUTH EVERY DAY 90 tablet 1  . Coenzyme Q10 (COQ10 PO) Take 1 capsule by mouth daily.     [name] Kitchen ELIQUIS 5 MG TABS tablet TAKE 1 TABLET BY MOUTH TWICE A DAY 180 tablet 1  . glucose blood test strip 1 each by Other route daily. Use daily for glucose control - Dx 250.00 100 each 0  . hydrochlorothiazide (MICROZIDE) 12.5 MG capsule TAKE 1 CAPSULE BY MOUTH EVERY DAY 90 capsule 0  . lisinopril (ZESTRIL) 40 MG tablet TAKE 1 AND 1/2 TABLET BY MOUTH EVERY DAY 135 tablet 1  . metFORMIN (GLUCOPHAGE) 1000 MG tablet TAKE 1 TABLET (1,000 MG TOTAL) BY MOUTH 2 (TWO) TIMES DAILY WITH A MEAL. 180 tablet 1  . propranolol (INDERAL) 10 MG tablet Take 1 tablet (10 mg total) by mouth 4 (four) times daily as needed (palpitations, irregular or fast heart rate). 90 tablet 3  . sildenafil (REVATIO) 20 MG tablet TAKE 1 TO 2 TABLETS 2 TO 3 HOURS BEFORE SEX 30 tablet 11  . TRULICITY 1.5 LG/9.2JJ SOPN INJECT 1.5 MG INTO THE SKIN ONCE A WEEK. 4 pen 11   No current  facility-administered medications on file prior to visit.   No Known Allergies Family History  Problem Relation Age of Onset  . Aortic aneurysm Other        family history  . Cancer Other        Breast (1st degree relative)  . Pancreatic cancer Mother 77  . Colon cancer Neg Hx   . Rectal cancer Neg Hx   . Stomach cancer Neg Hx   . Esophageal cancer Neg Hx     PE: BP (!) 142/80   Pulse 87   Ht 6'  2" (1.88 m)   Wt 204 lb (92.5 kg)   SpO2 96%   BMI 26.19 kg/m  Wt Readings from Last 3 Encounters:  03/26/20 204 lb (92.5 kg)  02/06/20 210 lb 11.2 oz (95.6 kg)  11/26/19 215 lb (97.5 kg)   Constitutional: overweight, in NAD Eyes: PERRLA, EOMI, no exophthalmos ENT: moist mucous membranes, no thyromegaly, no cervical lymphadenopathy Cardiovascular: RRR, No MRG Respiratory: CTA B Gastrointestinal: abdomen soft, NT, ND, BS+ Musculoskeletal: no deformities, strength intact in all 4 Skin: moist, warm, no rashes Neurological: no tremor with outstretched hands, DTR normal in all 4  ASSESSMENT: 1. DM2, insulin-dependent, uncontrolled, with complications - PN - ED  2. HL  3.  Overweight  PLAN:  1. Patient with longstanding previously uncontrolled type 2 diabetes, on oral antidiabetic regimen with Metformin, GLP-1 receptor agonist, and as needed sulfonylurea.  We moved to Metformin dose at night his sugars are higher in the morning.  At last visit, he only had occasional higher blood sugars in the morning, but the majority of his sugars were at goal.  At that time, we discussed about taking glipizide before a larger meal.  HbA1c was slightly higher, at 7%. -At this visit, sugars are mostly at goal with the exception of few hyperglycemic spikes in the morning after a rare dietary indiscretion at night or after he forgot to take Metformin the night before.  He did not take sulfonylurea in the last few months.  He does have it at hand, if needed.  For now, there is no reason to change his  regimen.  I congratulated him for his dietary discipline and continuing current plant-based diet. - I suggested to:  Patient Instructions  Please continue: - Trulicity 1.5 mg weekly. - Metformin 2000 mg with dinner (- Glipizide 5 mg before a large dinner)  KEEP UP THE GOOD WORK!  Please return in 4-6 months with your sugar log.   - we checked his HbA1c: 6.4% (better) - advised to check sugars at different times of the day - 1x a day, rotating check times - advised for yearly eye exams >> he is UTD - return to clinic in 4-6 months  2.  HL -Reviewed latest lipid panel from 2021: LDL excellent, at goal, HDL slightly low, triglycerides at goal: Lab Results  Component Value Date   CHOL 101 02/06/2020   HDL 38.00 (L) 02/06/2020   LDLCALC 45 02/06/2020   LDLDIRECT 77.0 07/07/2015   TRIG 86.0 02/06/2020   CHOLHDL 3 02/06/2020  -Continues Lipitor and co-Q10 without side effects.  He does have some joint aches, but he is not sure if these are from Lipitor.  3.  Overweight -he lost approximately 26 pounds immediately after starting a GLP-1 receptor agonist and switching to a more plant-based diet.  At last visit, weight was stable.  Since last visit, he lost 11 pounds. -We will continue Trulicity, which should also help with weight loss  [name], MD PhD [facility]

## 2020-03-26 NOTE — Addendum Note (Signed)
Addended by: Cardell Peach I on: 03/26/2020 10:28 AM   Modules accepted: Orders

## 2020-03-26 NOTE — Patient Instructions (Signed)
Please continue: - Trulicity 1.5 mg weekly. - Metformin 2000 mg with dinner (- Glipizide 5 mg before a large dinner)  KEEP UP THE GOOD WORK!  Please return in 4-6 months with your sugar log.

## 2020-04-08 NOTE — Chronic Care Management (AMB) (Signed)
Chronic Care Management Pharmacy  Name: Robert [name]  MRN: [id number] DOB: 1951-01-03  Chief Complaint/ HPI  Robert [name],  69 y.o. , male presents for their Initial CCM visit with the clinical pharmacist via telephone due to COVID-19 Pandemic. 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 He tries to go out everyday and stay active. He used to swim 5 times a week before COVID pandemic, but still tries to be active by doing floor exercise workouts at home. He does yard work. He is living with his wife and they have been married 14 years. He recently lost his 2 yo son 3 months ago due to his battle with bone marrow cancer. He has lost weight and now weights around 200 lbs. He has been doing well and has no current concerns prior to visit.  PCP : Isaac Bliss, Rayford Halsted, MD  Their chronic conditions include: Afib, HTN, HLD, type 2 diabetes, ED  Office Visits: 02/06/20 OV - Routine health exam. A1c under controlled at 7% and followed by endocrinology. Afib followed by Dr. Acie Fredrickson. BP stable. No changes with medications  Consult Visit: 03/26/20 OV (Gherghe, IM) - DM f/u. Moved metformin dose at night since sugars are higher in the morning. A1c at 6.4%. Lost 26 lbs since starting on GLP-1 RA and plant based diet.  Medications: Outpatient Encounter Medications as of 04/14/2020  Medication Sig  . atorvastatin (LIPITOR) 40 MG tablet TAKE 1 TABLET BY MOUTH EVERY DAY  . Coenzyme Q10 (COQ10 PO) Take 1 capsule by mouth daily.   [name] Kitchen ELIQUIS 5 MG TABS tablet TAKE 1 TABLET BY MOUTH TWICE A DAY  . glucose blood test strip 1 each by Other route daily. Use daily for glucose control - Dx 250.00  . hydrochlorothiazide (MICROZIDE) 12.5 MG capsule TAKE 1 CAPSULE BY MOUTH EVERY DAY  . lisinopril (ZESTRIL) 40 MG tablet TAKE 1 AND 1/2 TABLET BY MOUTH EVERY DAY  . metFORMIN (GLUCOPHAGE) 1000 MG tablet TAKE 1 TABLET (1,000 MG TOTAL) BY MOUTH 2 (TWO) TIMES DAILY  WITH A MEAL.  [name] Kitchen propranolol (INDERAL) 10 MG tablet Take 1 tablet (10 mg total) by mouth 4 (four) times daily as needed (palpitations, irregular or fast heart rate).  . sildenafil (REVATIO) 20 MG tablet TAKE 1 TO 2 TABLETS 2 TO 3 HOURS BEFORE SEX  . TRULICITY 1.5 QJ/3.3LK SOPN INJECT 1.5 MG INTO THE SKIN ONCE A WEEK.   No facility-administered encounter medications on file as of 04/14/2020.     Transportation Needs: No Transportation Needs  . Lack of Transportation (Medical): No  . Lack of Transportation (Non-Medical): No     Physical Activity: Sufficiently Active  . Days of Exercise per Week: 5 days  . Minutes of Exercise per Session: 60 min    Current Diagnosis/Assessment:  Goals Addressed   None     Paroxysmal AFIB   Rate controlled Patient has failed these meds in past: None Patient is currently controlled on the following medications:   Eliquis 5 mg 1 tablet twice daily  Propranolol 10 mg 1 tablet by mouth 4 times daily as needed   Followed by Dr. Acie Fredrickson. Patient reports to take propanolol 10 mg as needed. Has not had to take it in the past weeks. He mentions having them handy. Denies any symptoms of severe bleeding or bruising.  Plan  Continue current medications   Hypertension   Office blood pressures are  BP Readings from Last  3 Encounters:  03/26/20 (!) 142/80  02/06/20 120/70  11/26/19 128/80    Patient has failed these meds in the past: none Patient is currently controlled on the following medications:  . HCTZ 12.5 mg 1 capsule daily . Lisinopril 40 mg 1 tablet daily . Propranolol 10 mg 1 tablet 4 times daily as needed   Patient checks BP at home infrequently  Patient home BP readings are ranging: 130/80  We discussed diet and exercise extensively. Patient reports taking lisinopril 40 mg 1 tablet once daily instead of 1.5 tablet. Patient lost around 45 lbs in 1.5 years and now is around 200 lbs. He started a plant based diet and has been doing.  Reports eating cheese burger from time to time. Denies drinking sodas and he states he makes his own tea. Patient remains active by doing floor exercises at home and going out and stay active.   Plan   Continue current medications and control with diet and exercise    Hyperlipidemia   Lipid Panel     Component Value Date/Time   CHOL 101 02/06/2020 0956   TRIG 86.0 02/06/2020 0956   HDL 38.00 (L) 02/06/2020 0956   CHOLHDL 3 02/06/2020 0956   VLDL 17.2 02/06/2020 0956   LDLCALC 45 02/06/2020 0956   LDLDIRECT 77.0 07/07/2015 0930     The ASCVD Risk score (Goff DC Jr., et al., 2013) failed to calculate for the following reasons:   The valid total cholesterol range is 130 to 320 mg/dL   Patient has failed these meds in past: rosuvastatin Patient is currently controlled on the following medications:  . Atorvastatin 40 mg 1 tablet once daily HS  Lipid panel WNL. Advised patient to supplement with omega 3 fish oil to increase his HDL a bit. Patient is eating healthy and remains physically active. Denies mylagias associated with atorvastatin.  Plan  Continue control with diet and exercise    Diabetes   Recent Relevant Labs: Lab Results  Component Value Date/Time   HGBA1C 6.4 (A) 03/26/2020 10:28 AM   HGBA1C 7.0 (A) 11/26/2019 02:49 PM   HGBA1C 7.6 (H) 10/04/2017 12:06 PM   HGBA1C 7.7 (H) 07/05/2017 08:34 AM   GFR 90.48 02/06/2020 09:56 AM   GFR 94.02 07/23/2018 12:20 PM   MICROALBUR 0.9 07/23/2018 12:20 PM   MICROALBUR 3.7 (H) 03/01/2017 10:40 AM     Checking BG: 2x per Day Twice a day - AM and bedtime  Avg FBG Readings: 110 Avg HS BG readings: 85  Patient has failed these meds in past: glipizide Patient is currently controlled on the following medications:  [name] Kitchen Metformin 1000 mg 1 tablet twice a day with a meal . Trulicity 1.5 UE/4.5 mL inject 1.5 mg into the skin once a week  Last diabetic Eye exam:  Lab Results  Component Value Date/Time   HMDIABEYEEXA No  Retinopathy 09/24/2019 12:00 AM    Last diabetic Foot exam:  Lab Results  Component Value Date/Time   HMDIABFOOTEX Done 03/31/2010 12:00 AM     Followed by Dr. Renne Crigler. Patient remains physically active and already lost weight by switching to plant based diet. Advised patient to continue with his diet and exercise and we'll hopefully see an A1c lower than 6%. Patient denies symptoms of hypoglycemia since he was taken off insulin.  Plan  Continue current medications and control with diet and exercise   Erectile Dysfunction   Patient has failed these meds in past: none Patient is currently controlled on the following  medications:  . Sildenafil 20 mg 1-2 tablets 2-3 hrs before sex  Patient reports using it once in awhile.   Plan  Continue current medications  OTCs/Health Maintenance   Patient is currently controlled on the following medications: . Coenzyme Q10 1 tablet daily . Ginkgo biloba 1 tablet daily . Multivitamin 1 tablet daily  Plan  Continue current medications    Vaccines   Reviewed and discussed patient's vaccination history.    Immunization History  Administered Date(s) Administered  . Fluad Quad(high Dose 65+) 07/02/2019  . Influenza Split 09/26/2011, 11/26/2012  . Influenza Whole 08/21/2007  . Influenza, High Dose Seasonal PF 07/10/2017, 07/23/2018  . Influenza,inj,Quad PF,6+ Mos 08/06/2013, 07/28/2015  . PFIZER SARS-COV-2 Vaccination 01/24/2020, 02/17/2020  . Pneumococcal Conjugate-13 03/01/2017  . Pneumococcal Polysaccharide-23 07/23/2018  . Td 08/21/2007  . Zoster 02/05/2013    Plan  Recommended patient receive shingles vaccine in the office/pharmacy.    Medication Management   Pharmacy/Benefits: [facility]  Adherence: Uses pill organizer Pt endorses 100% compliance  Patient is onboarding with [facility] for medication synchronization and delivery.  Verbal consent obtained for [facility] enhanced pharmacy services  (medication synchronization, adherence packaging, delivery coordination). A medication sync plan was created to allow patient to get all medications delivered once every 30 to 90 days per patient preference. Patient understands they have freedom to choose pharmacy and clinical pharmacist will coordinate care between all prescribers and [facility].  Plan  Utilize [facility] pharmacy for medication synchronization, packaging and delivery   Follow up: 4 month phone visit  Geraldine Contras, PharmD Clinical Pharmacist [facility] [place] [phone]

## 2020-04-08 NOTE — Addendum Note (Signed)
Addended by: Westley Hummer B on: 04/08/2020 12:48 PM   Modules accepted: Orders

## 2020-04-14 ENCOUNTER — Other Ambulatory Visit: Payer: Self-pay

## 2020-04-14 ENCOUNTER — Ambulatory Visit: Payer: Medicare HMO | Admitting: Pharmacist

## 2020-04-14 DIAGNOSIS — I10 Essential (primary) hypertension: Secondary | ICD-10-CM

## 2020-04-14 DIAGNOSIS — E119 Type 2 diabetes mellitus without complications: Secondary | ICD-10-CM

## 2020-04-14 NOTE — Patient Instructions (Addendum)
Visit Information  It was a pleasure meeting with you today, Robert [name]! I am very encouraged with your healthy journey and I look forward to your progress this year. Thank you for meeting with me to discuss your medications! I look forward to working with you to achieve your health care goals. Below is a summary of what we talked about during the visit:  Goals Addressed            This Visit's Progress   . Chronic Care Management       CARE PLAN ENTRY  Current Barriers:  . Chronic Disease Management support, education, and care coordination needs related to Hypertension, Hyperlipidemia, and Diabetes   Hypertension BP Readings from Last 3 Encounters:  03/26/20 (!) 142/80  02/06/20 120/70  11/26/19 128/80   . Pharmacist Clinical Goal(s): o Over the next 120 days, patient will work with PharmD and providers to maintain BP goal <140/90 . Current regimen:  . HCTZ 12.5 mg 1 capsule daily . Lisinopril 40 mg 1 tablet daily . Propranolol 10 mg 1 tablet 4 times daily as needed  . Interventions: o Discussed low salt diet and exercising as tolerated extensively . Patient self care activities - Over the next 120 days, patient will: o Check BP once a week, document, and provide at future appointments o Ensure daily salt intake < 2300 mg/day o Continue with plant-based diet, exercise, and weight loss  Hyperlipidemia Lab Results  Component Value Date/Time   LDLCALC 45 02/06/2020 09:56 AM   LDLDIRECT 77.0 07/07/2015 09:30 AM   . Pharmacist Clinical Goal(s): o Over the next 120 days, patient will work with PharmD and providers to maintain LDL goal < 100 . Current regimen:  o Atorvastatin 40 mg 1 tablet once daily HS . Interventions: o Discussed low cholesterol diet and exercising as tolerated extensively . Patient self care activities - Over the next 120 days, patient will: o Continue with plant-based diet, exercise, and weight loss  Diabetes Lab Results  Component Value Date/Time    HGBA1C 6.4 (A) 03/26/2020 10:28 AM   HGBA1C 7.0 (A) 11/26/2019 02:49 PM   HGBA1C 7.6 (H) 10/04/2017 12:06 PM   HGBA1C 7.7 (H) 07/05/2017 08:34 AM   . Pharmacist Clinical Goal(s): o Over the next 120 days, patient will work with PharmD and providers to maintain A1c goal <7% . Current regimen:  . Metformin 1000 mg 1 tablet twice a day with a meal . Trulicity 1.5 BD/5.3 mL inject 1.5 mg into the skin once a week . Interventions: o Discussed carbohydrate counting and exercising as tolerated extensively . Patient self care activities - Over the next 120 days, patient will: o Check blood sugar twice daily, document, and provide at future appointments o Contact provider with any episodes of hypoglycemia  Medication management . Pharmacist Clinical Goal(s): o Over the next 120 days, patient will work with PharmD and providers to achieve optimal medication adherence . Current pharmacy: [facility] . Interventions o Comprehensive medication review performed. o Utilize [facility] for medication synchronization, packaging and delivery . Patient self care activities - Over the next 120 days, patient will: o Focus on medication adherence by medication synchronization and delivery o Take medications as prescribed o Report any questions or concerns to PharmD and/or provider(s)  Initial goal documentation        Robert [name] was given information about Chronic Care Management services today including:  1. CCM service includes personalized support from designated clinical staff supervised by his physician,  including individualized plan of care and coordination with other care providers 2. 24/7 contact phone numbers for assistance for urgent and routine care needs. 3. Standard insurance, coinsurance, copays and deductibles apply for chronic care management only during months in which we provide at least 20 minutes of these services. Most insurances cover these services at 100%, however  patients may be responsible for any copay, coinsurance and/or deductible if applicable. This service may help you avoid the need for more expensive face-to-face services. 4. Only one practitioner may furnish and bill the service in a calendar month. 5. The patient may stop CCM services at any time (effective at the end of the month) by phone call to the office staff.  Patient agreed to services and verbal consent obtained.   The patient verbalized understanding of instructions provided today and agreed to receive a mailed copy of patient instruction and/or educational materials. Telephone follow up appointment with pharmacy team member scheduled for: 08/10/20 at 4 PM  Geraldine Contras, PharmD Clinical Pharmacist [facility] [phone]   Cholesterol Content in Foods Cholesterol is a waxy, fat-like substance that helps to carry fat in the blood. The body needs cholesterol in small amounts, but too much cholesterol can cause damage to the arteries and heart. Most people should eat less than 200 milligrams (mg) of cholesterol a day. Foods with cholesterol  Cholesterol is found in animal-based foods, such as meat, seafood, and dairy. Generally, low-fat dairy and lean meats have less cholesterol than full-fat dairy and fatty meats. The milligrams of cholesterol per serving (mg per serving) of common cholesterol-containing foods are listed below. Meat and other proteins  Egg -- one large whole egg has 186 mg.  Veal shank -- 4 oz has 141 mg.  Lean ground [place] (93% lean) -- 4 oz has 118 mg.  Fat-trimmed lamb loin -- 4 oz has 106 mg.  Lean ground beef (90% lean) -- 4 oz has 100 mg.  Lobster -- 3.5 oz has 90 mg.  Pork loin chops -- 4 oz has 86 mg.  Canned salmon -- 3.5 oz has 83 mg.  Fat-trimmed beef top loin -- 4 oz has 78 mg.  Frankfurter -- 1 frank (3.5 oz) has 77 mg.  Crab -- 3.5 oz has 71 mg.  Roasted chicken without skin, white meat -- 4 oz has 66  mg.  Light bologna -- 2 oz has 45 mg.  Deli-cut [place] -- 2 oz has 31 mg.  Canned tuna -- 3.5 oz has 31 mg.  Berniece Salines -- 1 oz has 29 mg.  Oysters and mussels (raw) -- 3.5 oz has 25 mg.  Mackerel -- 1 oz has 22 mg.  Trout -- 1 oz has 20 mg.  Pork sausage -- 1 link (1 oz) has 17 mg.  Salmon -- 1 oz has 16 mg.  Tilapia -- 1 oz has 14 mg. Dairy  Soft-serve ice cream --  cup (4 oz) has 103 mg.  Whole-milk yogurt -- 1 cup (8 oz) has 29 mg.  Cheddar cheese -- 1 oz has 28 mg.  American cheese -- 1 oz has 28 mg.  Whole milk -- 1 cup (8 oz) has 23 mg.  2% milk -- 1 cup (8 oz) has 18 mg.  Cream cheese -- 1 tablespoon (Tbsp) has 15 mg.  Cottage cheese --  cup (4 oz) has 14 mg.  Low-fat (1%) milk -- 1 cup (8 oz) has 10 mg.  Sour cream -- 1 Tbsp has 8.5  mg.  Low-fat yogurt -- 1 cup (8 oz) has 8 mg.  Nonfat Greek yogurt -- 1 cup (8 oz) has 7 mg.  Half-and-half cream -- 1 Tbsp has 5 mg. Fats and oils  Cod liver oil -- 1 tablespoon (Tbsp) has 82 mg.  Butter -- 1 Tbsp has 15 mg.  Lard -- 1 Tbsp has 14 mg.  Bacon grease -- 1 Tbsp has 14 mg.  Mayonnaise -- 1 Tbsp has 5-10 mg.  Margarine -- 1 Tbsp has 3-10 mg. Exact amounts of cholesterol in these foods may vary depending on specific ingredients and brands. Foods without cholesterol Most plant-based foods do not have cholesterol unless you combine them with a food that has cholesterol. Foods without cholesterol include:  Grains and cereals.  Vegetables.  Fruits.  Vegetable oils, such as olive, canola, and sunflower oil.  Legumes, such as peas, beans, and lentils.  Nuts and seeds.  Egg whites. Summary  The body needs cholesterol in small amounts, but too much cholesterol can cause damage to the arteries and heart.  Most people should eat less than 200 milligrams (mg) of cholesterol a day. This information is not intended to replace advice given to you by your health care provider. Make sure you discuss any  questions you have with your health care provider. Document Revised: 09/22/2017 Document Reviewed: 06/06/2017 Elsevier Patient Education  [place].

## 2020-04-20 ENCOUNTER — Other Ambulatory Visit: Payer: Self-pay | Admitting: Internal Medicine

## 2020-04-20 DIAGNOSIS — I10 Essential (primary) hypertension: Secondary | ICD-10-CM

## 2020-04-21 ENCOUNTER — Other Ambulatory Visit: Payer: Self-pay | Admitting: *Deleted

## 2020-04-21 ENCOUNTER — Telehealth: Payer: Self-pay | Admitting: Internal Medicine

## 2020-04-21 ENCOUNTER — Telehealth: Payer: Self-pay | Admitting: Cardiovascular Disease

## 2020-04-21 DIAGNOSIS — E119 Type 2 diabetes mellitus without complications: Secondary | ICD-10-CM

## 2020-04-21 DIAGNOSIS — E785 Hyperlipidemia, unspecified: Secondary | ICD-10-CM

## 2020-04-21 DIAGNOSIS — I10 Essential (primary) hypertension: Secondary | ICD-10-CM

## 2020-04-21 MED ORDER — LISINOPRIL 40 MG PO TABS: ORAL_TABLET | ORAL | 1 refills | Status: DC

## 2020-04-21 MED ORDER — SILDENAFIL CITRATE 20 MG PO TABS: ORAL_TABLET | ORAL | 5 refills | Status: DC

## 2020-04-21 MED ORDER — APIXABAN 5 MG PO TABS: 5.0000 mg | ORAL_TABLET | Freq: Two times a day (BID) | ORAL | 1 refills | Status: DC

## 2020-04-21 MED ORDER — HYDROCHLOROTHIAZIDE 12.5 MG PO CAPS: 12.5000 mg | ORAL_CAPSULE | Freq: Every day | ORAL | 1 refills | Status: DC

## 2020-04-21 MED ORDER — ATORVASTATIN CALCIUM 40 MG PO TABS: 40.0000 mg | ORAL_TABLET | Freq: Every day | ORAL | 1 refills | Status: DC

## 2020-04-21 MED ORDER — METFORMIN HCL 1000 MG PO TABS: 1000.0000 mg | ORAL_TABLET | Freq: Two times a day (BID) | ORAL | 1 refills | Status: DC

## 2020-04-21 MED ORDER — GLUCOSE BLOOD VI STRP: 1.0000 | ORAL_STRIP | Freq: Every day | 0 refills | Status: DC

## 2020-04-21 NOTE — Telephone Encounter (Signed)
*  STAT* If patient is at the pharmacy, call can be transferred to refill team.   1. Which medications need to be refilled? (please list name of each medication and dose if known)  ELIQUIS 5 MG TABS tablet  2. Which pharmacy/location (including street and city if local pharmacy) is medication to be sent to? [facility] - [place], [place] - [place] [address]  3. Do they need a 30 day or 90 day supply? 30 day supply

## 2020-04-21 NOTE — Telephone Encounter (Signed)
Robert [name] from [facility] ph# [phone] requests the following RX:  Medication Refill Request  Did you call your pharmacy and request this refill first? Pharmacy request  If patient has not contacted pharmacy first, instruct them to do so for future refills.   Remind them that contacting the pharmacy for their refill is the quickest method to get the refill.   Refill policy also stated that it will take anywhere between 24-72 hours to receive the refill.    Name of medication?  TRULICITY 1.5 SP/2.3RA SOPN  Is this a 90 day supply? No-30 day  Name and location of pharmacy?  [facility] - [place], [place] - [address] Phone:  [phone]  Fax:  [phone]      Is the request for diabetes test strips? No  If yes, what brand? N/A

## 2020-04-21 NOTE — Telephone Encounter (Signed)
Prescription refill request for Eliquis received. Indication: Atrial Fibrillation Last office visit: 02/20/2019 Richardson Dopp upcoming 05/25/20 Dr Acie Fredrickson Scr: 0.84 02/06/2020 Age: 69 Weight: 92.5 kg  Prescription refilled

## 2020-04-22 MED ORDER — TRULICITY 1.5 MG/0.5ML ~~LOC~~ SOAJ: SUBCUTANEOUS | 11 refills | Status: DC

## 2020-04-22 NOTE — Telephone Encounter (Signed)
RX sent

## 2020-05-25 ENCOUNTER — Encounter: Payer: Self-pay | Admitting: Cardiovascular Disease

## 2020-05-25 ENCOUNTER — Ambulatory Visit: Payer: Medicare HMO | Admitting: Cardiovascular Disease

## 2020-05-25 ENCOUNTER — Other Ambulatory Visit: Payer: Self-pay

## 2020-05-25 VITALS — BP 126/74 | HR 75 | Ht 74.0 in | Wt 207.4 lb

## 2020-05-25 DIAGNOSIS — I480 Paroxysmal atrial fibrillation: Secondary | ICD-10-CM | POA: Diagnosis not present

## 2020-05-25 DIAGNOSIS — I10 Essential (primary) hypertension: Secondary | ICD-10-CM

## 2020-05-25 MED ORDER — METOPROLOL SUCCINATE ER 25 MG PO TB24
25.0000 mg | ORAL_TABLET | Freq: Every day | ORAL | 3 refills | Status: DC
Start: 2020-05-25 — End: 2020-07-14

## 2020-05-25 MED ORDER — PROPRANOLOL HCL 10 MG PO TABS: 10.0000 mg | ORAL_TABLET | Freq: Four times a day (QID) | ORAL | 9 refills | Status: DC | PRN

## 2020-05-25 NOTE — Progress Notes (Signed)
Cardiology Office Note:    Date:  05/25/2020   ID:  Robert, [name] 1951/10/07, MRN [id number]  PCP:  Isaac Bliss, Rayford Halsted, MD  Cardiologist:  Mertie Moores, MD   Referring MD: Isaac Bliss, [place]*   Problem list 1.  Atrial fibrillation 2.  Hyperlipidemia 3.  Diabetes mellitus  Chief Complaint  Patient presents with  . Atrial Fibrillation     11/06/17    Robert [name] is a 69 y.o. male with a hx of palpitations for the past several months.  They started the Monday before Thanksgiving. In the middle of the night to go the bathroom.  Felt some pounding in his ears. Counted his heart rate in the 140s.  Also verified his Hr  with his blood pressure cuff. The arrhythmia had resolved by the next morning. He has not had any dizziness/syncope or presyncope.  He exercises regularly.  He swims at the gym 5 days a week.  He is swimming speech seems to be about the same as before he was diagnosed with atrial fibrillation.  He had another episode 2 days later.  He went to the emergency room down in [facility] but was back in normal rhythm at that time.   He returned and saw his primary medical doctor and an event monitor was placed.  We found atrial fibrillation with a slightly rapid ventricular response.  He was called into the office for further evaluation and management.  Episodes seem to be at night Dr. Sherren Mocha started Atenolol   Is retiured now,  [facility] .    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  Claudication-like symptoms.  Has seen Dr. Oneida Alar  [facility] and his arterial flow is found to be near normal in both legs. . He has not been diagnosed with sleep apnea but his wife states that he snores.  April 19, 2018:   Robert [name]   is seen back today for follow-up of his paroxysmal atrial for ablation, hyperlipidemia, hypertension, diabetes mellitus.  He is basically asymptomatic.  He cannot tell any difference between when he is  in atrial fibrillation or in sinus rhythm.  He will occasionally feel his pulse and feel that it is irregular.  He typically will take a propranolol tablet with conversion back to sinus rhythm in less than an hour. Still exercising at the [facility] 5 days a week. Checked out sleep studies,   Was going to cost $4,000.  Diabetes is better controlled on Trulicity.   May 25, 2020:   Robert [name] is seen back today for a follow-up visit.  He has a history of paroxysmal atrial fibrillation, hyperlipidemia, diabetes mellitus. Has notice more Afib this past week.  Has been using his propranolol more  Has been under more stress  has changed is diet to a more plant based diet Trigs have improved.    Past Medical History:  Diagnosis Date  . Diabetes mellitus type [name]   . HLD (hyperlipidemia)   . HTN (hypertension)   . Hx of colonic polyps   . Paroxysmal atrial fibrillation ([place]) 03/12/2019  . Skin cancer    history of    Past Surgical History:  Procedure Laterality Date  . COLONOSCOPY    . PATELLAR TENDON REPAIR     Left  . Removal of excess tissue     Right and Left upper eyelids    Current Medications: Current  Meds  Medication Sig  . apixaban (ELIQUIS) 5 MG TABS tablet Take 1 tablet (5 mg total) by mouth 2 (two) times daily.  [name] Kitchen atorvastatin (LIPITOR) 40 MG tablet Take 1 tablet (40 mg total) by mouth daily.  . Coenzyme Q10 (COQ10 PO) Take 1 capsule by mouth daily.   . Dulaglutide (TRULICITY) 1.5 HY/0.7PX SOPN INJECT 1.5 MG INTO THE SKIN ONCE A WEEK.  [name] Kitchen glucose blood test strip 1 each by Other route daily. Use daily for glucose control - Dx 250.00  . hydrochlorothiazide (MICROZIDE) 12.5 MG capsule Take 1 capsule (12.5 mg total) by mouth daily.  [name] Kitchen lisinopril (ZESTRIL) 40 MG tablet TAKE 1 AND 1/2 TABLET BY MOUTH EVERY DAY  . metFORMIN (GLUCOPHAGE) 1000 MG tablet Take 1 tablet (1,000 mg total) by mouth 2 (two) times daily with a meal.  . propranolol (INDERAL) 10 MG tablet Take 1 tablet (10 mg  total) by mouth 4 (four) times daily as needed (palpitations, irregular or fast heart rate).  . sildenafil (REVATIO) 20 MG tablet TAKE 1 TO 2 TABLETS 2 TO 3 HOURS BEFORE SEX  . [DISCONTINUED] propranolol (INDERAL) 10 MG tablet Take 1 tablet (10 mg total) by mouth 4 (four) times daily as needed (palpitations, irregular or fast heart rate).     Allergies:   Patient has no known allergies.   Social History   Socioeconomic History  . Marital status: Married    Spouse name: Not on file  . Number of children: Not on file  . Years of education: Not on file  . Highest education level: Not on file  Occupational History  . Occupation: DVD's  . Occupation: Cabin crew: Self Employed  Tobacco Use  . Smoking status: Former Smoker    Types: Cigars    Quit date: 02/22/2011    Years since quitting: 9.2  . Smokeless tobacco: Never Used  . Tobacco comment: 2 cigars monthly or less  Vaping Use  . Vaping Use: Never used  Substance and Sexual Activity  . Alcohol use: Yes    Alcohol/week: 0.0 standard drinks    Comment: 6-8oz /wk  . Drug use: No  . Sexual activity: Not on file  Other Topics Concern  . Not on file  Social History Narrative   Patient is married he has been employed in Radiographer, therapeutic   Former smoker with occasional alcohol use   Social Determinants of Radio broadcast assistant Strain:   . Difficulty of Paying Living Expenses:   Food Insecurity:   . Worried About Charity fundraiser in the Last Year:   . Arboriculturist in the Last Year:   Transportation Needs: No Transportation Needs  . Lack of Transportation (Medical): No  . Lack of Transportation (Non-Medical): No  Physical Activity: Sufficiently Active  . Days of Exercise per Week: 5 days  . Minutes of Exercise per Session: 60 min  Stress:   . Feeling of Stress :   Social Connections:   . Frequency of Communication with Friends and Family:   . Frequency of Social Gatherings with Friends and  Family:   . Attends Religious Services:   . Active Member of Clubs or Organizations:   . Attends Archivist Meetings:   [name] Kitchen Marital Status:      Family History: The patient's family history includes Aortic aneurysm in an other family member; Cancer in an other family member; Pancreatic cancer (age of onset: 57) in his  mother. There is no history of Colon cancer, Rectal cancer, Stomach cancer, or Esophageal cancer.  ROS:   Please see the history of present illness.     All other systems reviewed and are negative.  EKGs/Labs/Other Studies Reviewed:    The following studies were reviewed today:   EKG:   Aug. 2, 2021:   NSR at 75.   TWI in V3-V4.  No singificant changes from previous ECG     Recent Labs: 02/06/2020: ALT 12; BUN 15; Creatinine, Ser 0.84; Hemoglobin 16.6; Platelets 194.0; Potassium 5.1; Sodium 134; TSH 1.08  Recent Lipid Panel    Component Value Date/Time   CHOL 101 02/06/2020 0956   TRIG 86.0 02/06/2020 0956   HDL 38.00 (L) 02/06/2020 0956   CHOLHDL 3 02/06/2020 0956   VLDL 17.2 02/06/2020 0956   LDLCALC 45 02/06/2020 0956   LDLDIRECT 77.0 07/07/2015 0930    Physical Exam:      Physical Exam: Blood pressure 126/74, pulse 75, height 6\' 2"  (1.88 m), weight 207 lb 6.4 oz (94.1 kg), SpO2 98 %.  GEN:  Well nourished, well developed in no acute distress HEENT: Normal NECK: No JVD; No carotid bruits LYMPHATICS: No lymphadenopathy CARDIAC: RRR , no murmurs, rubs, gallops RESPIRATORY:  Clear to auscultation without rales, wheezing or rhonchi  ABDOMEN: Soft, non-tender, non-distended MUSCULOSKELETAL:  No edema; No deformity  SKIN: Warm and dry NEUROLOGIC:  Alert and oriented x 3   ASSESSMENT:    1. PAF (paroxysmal atrial fibrillation) (HCC)    PLAN:    In order of problems listed above:  1. Paroxysmal atrial fibrillation:   CHADS2VASC  = 3  ( age 70, DM, HTN) he is having some worsening episodes of palpitations.  He thinks it might be due to  increased stress.  We will add Toprol-XL 25 mg a day.  He will continue to use the propranolol 10 mg to take as needed.    2.  HTN:    Blood pressure is very well controlled.  Continue current medications.    Medication Adjustments/Labs and Tests Ordered: Current medicines are reviewed at length with the patient today.  Concerns regarding medicines are outlined above.  Orders Placed This Encounter  Procedures  . EKG 12-Lead   Meds ordered this encounter  Medications  . propranolol (INDERAL) 10 MG tablet    Sig: Take 1 tablet (10 mg total) by mouth 4 (four) times daily as needed (palpitations, irregular or fast heart rate).    Dispense:  30 tablet    Refill:  9  . metoprolol succinate (TOPROL XL) 25 MG 24 hr tablet    Sig: Take 1 tablet (25 mg total) by mouth daily.    Dispense:  90 tablet    Refill:  3    Signed, Mertie Moores, MD  05/25/2020 11:41 AM    [place]

## 2020-05-25 NOTE — Patient Instructions (Signed)
Medication Instructions:  1) START TOPROL 25 mg daily *If you need a refill on your cardiac medications before your next appointment, please call your pharmacy*  Follow-Up: At [facility], you and your health needs are our priority.  As part of our continuing mission to provide you with exceptional heart care, we have created designated Provider Care Teams.  These Care Teams include your primary Cardiologist (physician) and Advanced Practice Providers (APPs -  Physician Assistants and Nurse Practitioners) who all work together to provide you with the care you need, when you need it. Your next appointment:   12 month(s) The format for your next appointment:   In Person Provider:   You may see Mertie Moores, MD or one of the following Advanced Practice Providers on your designated Care Team:    Richardson Dopp, PA-C  [place], [place]

## 2020-06-01 ENCOUNTER — Telehealth: Payer: Self-pay

## 2020-06-01 DIAGNOSIS — I10 Essential (primary) hypertension: Secondary | ICD-10-CM

## 2020-06-01 DIAGNOSIS — E785 Hyperlipidemia, unspecified: Secondary | ICD-10-CM

## 2020-06-01 NOTE — Progress Notes (Addendum)
Chronic Care Management Pharmacy Assistant   Name: FAYETTE HAMADA  MRN: [id number] DOB: 14-Jan-1951  Reason for Encounter: Medication Review   PCP : Isaac Bliss, Rayford Halsted, MD  Allergies:  No Known Allergies  Medications: Outpatient Encounter Medications as of 06/01/2020  Medication Sig  . apixaban (ELIQUIS) 5 MG TABS tablet Take 1 tablet (5 mg total) by mouth 2 (two) times daily.  [name] Kitchen atorvastatin (LIPITOR) 40 MG tablet Take 1 tablet (40 mg total) by mouth daily.  . Coenzyme Q10 (COQ10 PO) Take 1 capsule by mouth daily.   . Dulaglutide (TRULICITY) 1.5 VZ/8.5YI SOPN INJECT 1.5 MG INTO THE SKIN ONCE A WEEK.  [name] Kitchen glucose blood test strip 1 each by Other route daily. Use daily for glucose control - Dx 250.00  . hydrochlorothiazide (MICROZIDE) 12.5 MG capsule Take 1 capsule (12.5 mg total) by mouth daily.  [name] Kitchen lisinopril (ZESTRIL) 40 MG tablet TAKE 1 AND 1/2 TABLET BY MOUTH EVERY DAY  . metFORMIN (GLUCOPHAGE) 1000 MG tablet Take 1 tablet (1,000 mg total) by mouth 2 (two) times daily with a meal.  . metoprolol succinate (TOPROL XL) 25 MG 24 hr tablet Take 1 tablet (25 mg total) by mouth daily.  . propranolol (INDERAL) 10 MG tablet Take 1 tablet (10 mg total) by mouth 4 (four) times daily as needed (palpitations, irregular or fast heart rate).  . sildenafil (REVATIO) 20 MG tablet TAKE 1 TO 2 TABLETS 2 TO 3 HOURS BEFORE SEX   No facility-administered encounter medications on file as of 06/01/2020.    Current Diagnosis: Patient Active Problem List   Diagnosis Date Noted  . Long term current use of anticoagulant -Eliquis 03/12/2019  . Paroxysmal atrial fibrillation ([place]) 03/12/2019  . Overweight (BMI 25.0-29.9) 01/03/2019  . Type 2 diabetes mellitus without complication, without long-term current use of insulin ([place]) 07/23/2018  . BPH associated with nocturia 03/01/2017  . Snoring 03/12/2013  . Irregular heart rate 06/23/2011  . Hx of colonic polyps   . ERECTILE DYSFUNCTION  12/19/2008  . SKIN CANCER, HX OF 12/19/2008  . Hyperlipidemia 08/21/2007  . Essential hypertension 08/21/2007    Goals Addressed   None    Reviewed chart for medication changes ahead of medication coordination call.  No  hospital visits since last care coordination call.  Consults: (Cardiology) Patient presented in the office with Dr. Cathie Olden for A-fib with a hx of palpitations for the past several months.  Patient was instructed to start Toprol 25 mg daily. Patient started this medication, but discontinued it due to brown colored urine.  BP Readings from Last 3 Encounters:  05/25/20 126/74  03/26/20 (!) 142/80  02/06/20 120/70    Lab Results  Component Value Date   HGBA1C 6.4 (A) 03/26/2020     Patient obtains medications through Vials  90 Days   Last adherence delivery included:  Propranolol 10mg ; one tab four times a day PRN  Metoprolol 25mg  ER; one tab daily  Patient declined the following medications last month due to additional supply on hand.  Lisinopril 40mg ; one and a half tab once a day  HCTZ 12.5mg ; one cap daily  Atorvastatin 40mg ; one tab daily  Sildenafil 20mg ; one to two tabs PRN  Eliquis 5mg ; one tab twice a day  Metformin 1,000mg ; one tab twice a day with meals  Trulicity Inj 1.5mg /0.5 ml; inject 1.5 mg into the skin weekly   Patient is due for next adherence delivery on: 06-16-2020. Called patient and reviewed medications and  coordinated delivery.  This delivery to include:  Metformin 1,000mg ; one tab twice a day with meals  Trulicity Inj 1.5mg /0.5 ml; inject 1.5 mg into the skin weekly  Patient declined the following medications (Patient states he has adequate supply)  Lisinopril 40mg ; one and a half tab once a day  HCTZ 12.5mg ; one cap daily  Atorvastatin 40mg ; one tab daily  Sildenafil 20mg ; one to two tabs PRN  Eliquis 5mg ; one tab twice a day   Propranolol 10mg ; one tab four times a day PRN  Metoprolol 25mg  ER; one tab  daily  Patient does not need refills at this time .  Confirmed delivery date of 06-16-2020, advised patient that pharmacy will contact them the morning of delivery.  Fanny Skates, [place] Pharmacist Assistant [phone]  Follow-Up:  Coordination of Enhanced Pharmacy Services and Pharmacist Review

## 2020-06-08 ENCOUNTER — Other Ambulatory Visit: Payer: Self-pay | Admitting: Internal Medicine

## 2020-06-08 DIAGNOSIS — I10 Essential (primary) hypertension: Secondary | ICD-10-CM

## 2020-06-17 ENCOUNTER — Telehealth: Payer: Self-pay

## 2020-06-17 DIAGNOSIS — E119 Type 2 diabetes mellitus without complications: Secondary | ICD-10-CM

## 2020-06-17 DIAGNOSIS — I10 Essential (primary) hypertension: Secondary | ICD-10-CM

## 2020-06-17 NOTE — Progress Notes (Signed)
    Chronic Care Management Pharmacy Assistant   Name: Robert [name]  MRN: [id number] DOB: 1950/11/23  Reason for Encounter: Medication Review   PCP : Isaac Bliss, Rayford Halsted, MD  Allergies:  No Known Allergies  Medications: Outpatient Encounter Medications as of 06/17/2020  Medication Sig  . apixaban (ELIQUIS) 5 MG TABS tablet Take 1 tablet (5 mg total) by mouth 2 (two) times daily.  [name] Kitchen atorvastatin (LIPITOR) 40 MG tablet Take 1 tablet (40 mg total) by mouth daily.  . Coenzyme Q10 (COQ10 PO) Take 1 capsule by mouth daily.   . Dulaglutide (TRULICITY) 1.5 UX/3.2TF SOPN INJECT 1.5 MG INTO THE SKIN ONCE A WEEK.  [name] Kitchen glucose blood test strip 1 each by Other route daily. Use daily for glucose control - Dx 250.00  . hydrochlorothiazide (MICROZIDE) 12.5 MG capsule Take 1 capsule (12.5 mg total) by mouth daily.  [name] Kitchen lisinopril (ZESTRIL) 40 MG tablet TAKE 1 AND 1/2 TABLET BY MOUTH EVERY DAY  . metFORMIN (GLUCOPHAGE) 1000 MG tablet Take 1 tablet (1,000 mg total) by mouth 2 (two) times daily with a meal.  . metoprolol succinate (TOPROL XL) 25 MG 24 hr tablet Take 1 tablet (25 mg total) by mouth daily.  . propranolol (INDERAL) 10 MG tablet Take 1 tablet (10 mg total) by mouth 4 (four) times daily as needed (palpitations, irregular or fast heart rate).  . sildenafil (REVATIO) 20 MG tablet TAKE 1 TO 2 TABLETS 2 TO 3 HOURS BEFORE SEX   No facility-administered encounter medications on file as of 06/17/2020.    Current Diagnosis: Patient Active Problem List   Diagnosis Date Noted  . Long term current use of anticoagulant -Eliquis 03/12/2019  . Paroxysmal atrial fibrillation ([place]) 03/12/2019  . Overweight (BMI 25.0-29.9) 01/03/2019  . Type 2 diabetes mellitus without complication, without long-term current use of insulin ([place]) 07/23/2018  . BPH associated with nocturia 03/01/2017  . Snoring 03/12/2013  . Irregular heart rate 06/23/2011  . Hx of colonic polyps   . ERECTILE DYSFUNCTION  12/19/2008  . SKIN CANCER, HX OF 12/19/2008  . Hyperlipidemia 08/21/2007  . Essential hypertension 08/21/2007    Goals Addressed   None    No OVs, Consults, or hospital visits since last care coordination call  No medication changes indicated.  BP Readings from Last 3 Encounters:  05/25/20 126/74  03/26/20 (!) 142/80  02/06/20 120/70    Lab Results  Component Value Date   HGBA1C 6.4 (A) 03/26/2020    Patient called to request him Eliquis 5 mg. Patient stated he thought he did not need this medication at the time of review, but realized today that he will be out of medication on Friday, September 27th. Coordinated a delivery for the patient with [facility]  Reviewed chart for medication changes ahead of medication coordination call.  Patient obtains medications through Vials  90 Days   Last adherence delivery included:   Metformin 1,000mg ; one tab twice a day with meals  Trulicity Inj 1.5mg /0.5 ml; inject 1.5 mg into the skin weekly   Coordinated acute fill for Eliquis 5mg ; one tab twice a day to be delivered 06-18-2020. Patient has been receiving a 30 day supply of this medication.  Confirmed delivery date of 06-18-2020, advised patient that pharmacy will contact them the morning of delivery.  Follow-Up:  Coordination of [place], [place] Pharmacist Assistant [phone]

## 2020-06-22 ENCOUNTER — Telehealth: Payer: Self-pay

## 2020-06-22 NOTE — Progress Notes (Signed)
  Please void note, open in error.  

## 2020-07-09 ENCOUNTER — Other Ambulatory Visit: Payer: Self-pay | Admitting: Cardiovascular Disease

## 2020-07-09 NOTE — Telephone Encounter (Signed)
Pt last saw Dr Acie Fredrickson 05/25/20, last labs 02/06/20 Creat 0.84, age 69, weight 94.1kg, based on specified criteria pt is on appropriate dosage of Eliquis 5mg  BID.  Will refill rx.

## 2020-07-14 ENCOUNTER — Telehealth: Payer: Self-pay

## 2020-07-14 MED ORDER — DILTIAZEM HCL ER COATED BEADS 180 MG PO CP24: 180.0000 mg | ORAL_CAPSULE | Freq: Every day | ORAL | 3 refills | Status: DC

## 2020-07-14 NOTE — Progress Notes (Signed)
    Chronic Care Management Pharmacy Assistant   Name: Robert [name]  MRN: [id number] DOB: November 24, 1950  Reason for Encounter: Medication Review   PCP : Isaac Bliss, Rayford Halsted, MD  Allergies:  No Known Allergies  Medications: Outpatient Encounter Medications as of 07/14/2020  Medication Sig  . atorvastatin (LIPITOR) 40 MG tablet Take 1 tablet (40 mg total) by mouth daily.  . Coenzyme Q10 (COQ10 PO) Take 1 capsule by mouth daily.   [name] Kitchen diltiazem (CARDIZEM CD) 180 MG 24 hr capsule Take 1 capsule (180 mg total) by mouth daily.  . Dulaglutide (TRULICITY) 1.5 QI/2.9NL SOPN INJECT 1.5 MG INTO THE SKIN ONCE A WEEK.  [name] Kitchen ELIQUIS 5 MG TABS tablet TAKE ONE TABLET BY MOUTH TWICE DAILY  . glucose blood test strip 1 each by Other route daily. Use daily for glucose control - Dx 250.00  . hydrochlorothiazide (MICROZIDE) 12.5 MG capsule Take 1 capsule (12.5 mg total) by mouth daily.  [name] Kitchen lisinopril (ZESTRIL) 40 MG tablet TAKE 1 AND 1/2 TABLET BY MOUTH EVERY DAY  . metFORMIN (GLUCOPHAGE) 1000 MG tablet Take 1 tablet (1,000 mg total) by mouth 2 (two) times daily with a meal.  . propranolol (INDERAL) 10 MG tablet Take 1 tablet (10 mg total) by mouth 4 (four) times daily as needed (palpitations, irregular or fast heart rate).  . sildenafil (REVATIO) 20 MG tablet TAKE 1 TO 2 TABLETS 2 TO 3 HOURS BEFORE SEX   No facility-administered encounter medications on file as of 07/14/2020.    Current Diagnosis: Patient Active Problem List   Diagnosis Date Noted  . Long term current use of anticoagulant -Eliquis 03/12/2019  . Paroxysmal atrial fibrillation ([place]) 03/12/2019  . Overweight (BMI 25.0-29.9) 01/03/2019  . Type 2 diabetes mellitus without complication, without long-term current use of insulin ([place]) 07/23/2018  . BPH associated with nocturia 03/01/2017  . Snoring 03/12/2013  . Irregular heart rate 06/23/2011  . Hx of colonic polyps   . ERECTILE DYSFUNCTION 12/19/2008  . SKIN CANCER, HX OF  12/19/2008  . Hyperlipidemia 08/21/2007  . Essential hypertension 08/21/2007     Follow-Up:  Pharmacist Review   Reviewed chart for medication changes ahead of medication coordination call.  No OVs, Consults, or hospital visits since last care coordination call/Pharmacist visit. (If appropriate, list visit date, provider name)  No medication changes indicated OR if recent visit, treatment plan here.  BP Readings from Last 3 Encounters:  05/25/20 126/74  03/26/20 (!) 142/80  02/06/20 120/70    Lab Results  Component Value Date   HGBA1C 6.4 (A) 03/26/2020     Patient obtains medications through Vials  30 Days    Patient is due for next adherence delivery on: No Fill needed. Called patient and reviewed medications and coordinated delivery.  This delivery to include:  No fill needed   Patient declined the following medications (meds) due to (reason)  Atorvastatin 40 MG Tablet (adqeuate supply)  Trulicity 1.5MG  (adqeuate supply)  Eliquis 5 MG Tablet (adqeuate supply)  Propanolol 10 MG Tablet (adqeuate supply)  Metformin 1000 MG Tablet (adqeuate Supply)   Metoprolol 25 mg Tablet ( stop taking)  Lisinopril 40 MG Tablet (adqeuate supply)  Hydrochlorothiazide 12.5 MG Capsule (adqeuate Supply)  Sildenafil (adqeuate supply)  Diltiazem 180 MG (adqeuate Supply)   Patient needs refills for None ID.  Confirmed delivery date of No fill needed, advised patient that pharmacy will contact them the morning of delivery.

## 2020-07-30 ENCOUNTER — Other Ambulatory Visit: Payer: Self-pay

## 2020-07-30 ENCOUNTER — Telehealth: Payer: Self-pay

## 2020-07-30 ENCOUNTER — Encounter: Payer: Self-pay | Admitting: Internal Medicine

## 2020-07-30 ENCOUNTER — Ambulatory Visit (INDEPENDENT_AMBULATORY_CARE_PROVIDER_SITE_OTHER): Payer: Medicare HMO | Admitting: Internal Medicine

## 2020-07-30 VITALS — BP 136/78 | HR 75 | Wt 207.6 lb

## 2020-07-30 DIAGNOSIS — E663 Overweight: Secondary | ICD-10-CM

## 2020-07-30 DIAGNOSIS — E119 Type 2 diabetes mellitus without complications: Secondary | ICD-10-CM

## 2020-07-30 DIAGNOSIS — E785 Hyperlipidemia, unspecified: Secondary | ICD-10-CM | POA: Diagnosis not present

## 2020-07-30 LAB — POCT GLYCOSYLATED HEMOGLOBIN (HGB A1C): Hemoglobin A1C: 6.4 % — AB (ref 4.0–5.6)

## 2020-07-30 NOTE — Addendum Note (Signed)
Addended by: Arnold Long R on: 07/30/2020 11:18 AM   Modules accepted: Orders

## 2020-07-30 NOTE — Progress Notes (Signed)
Chronic Care Management Pharmacy Assistant   Name: JACOLBY RISBY  MRN: [id number] DOB: 08-20-1951  Reason for Encounter: Medication Review    PCP : Isaac Bliss, Rayford Halsted, MD  Allergies:  No Known Allergies  Medications: Outpatient Encounter Medications as of 07/30/2020  Medication Sig  . atorvastatin (LIPITOR) 40 MG tablet Take 1 tablet (40 mg total) by mouth daily.  . Coenzyme Q10 (COQ10 PO) Take 1 capsule by mouth daily.   [name] Kitchen diltiazem (CARDIZEM CD) 180 MG 24 hr capsule Take 1 capsule (180 mg total) by mouth daily.  . Dulaglutide (TRULICITY) 1.5 JG/8.1LX SOPN INJECT 1.5 MG INTO THE SKIN ONCE A WEEK.  [name] Kitchen ELIQUIS 5 MG TABS tablet TAKE ONE TABLET BY MOUTH TWICE DAILY  . glucose blood test strip 1 each by Other route daily. Use daily for glucose control - Dx 250.00  . hydrochlorothiazide (MICROZIDE) 12.5 MG capsule Take 1 capsule (12.5 mg total) by mouth daily.  [name] Kitchen lisinopril (ZESTRIL) 40 MG tablet TAKE 1 AND 1/2 TABLET BY MOUTH EVERY DAY  . metFORMIN (GLUCOPHAGE) 1000 MG tablet Take 1 tablet (1,000 mg total) by mouth 2 (two) times daily with a meal.  . propranolol (INDERAL) 10 MG tablet Take 1 tablet (10 mg total) by mouth 4 (four) times daily as needed (palpitations, irregular or fast heart rate).  . sildenafil (REVATIO) 20 MG tablet TAKE 1 TO 2 TABLETS 2 TO 3 HOURS BEFORE SEX   No facility-administered encounter medications on file as of 07/30/2020.    Current Diagnosis: Patient Active Problem List   Diagnosis Date Noted  . Long term current use of anticoagulant -Eliquis 03/12/2019  . Paroxysmal atrial fibrillation ([facility]) 03/12/2019  . Overweight (BMI 25.0-29.9) 01/03/2019  . Type 2 diabetes mellitus without complication, without long-term current use of insulin ([place]) 07/23/2018  . BPH associated with nocturia 03/01/2017  . Snoring 03/12/2013  . Irregular heart rate 06/23/2011  . Hx of colonic polyps   . ERECTILE DYSFUNCTION 12/19/2008  . SKIN CANCER, HX OF  12/19/2008  . Hyperlipidemia 08/21/2007  . Essential hypertension 08/21/2007      Follow-Up:  Pharmacist Review   Reviewed chart for medication changes ahead of medication coordination call.  No OVs, Consults, or hospital visits since last care coordination call/Pharmacist visit.   07/30/2020 Endocrinology [name]    No medication changes indicated .  BP Readings from Last 3 Encounters:  05/25/20 126/74  03/26/20 (!) 142/80  02/06/20 120/70    Lab Results  Component Value Date   HGBA1C 6.4 (A) 03/26/2020     Patient obtains medications through Vials  90 Days   Last adherence delivery included: (medication name and frequency)  None ID Patient declined (meds) last month due to PRN use/additional supply on hand.   Atorvastatin 40 MG Tablet (adqeuate supply)             Trulicity 1.5MG  (adqeuate supply)             Eliquis 5 MG Tablet (adqeuate supply)             Propanolol 10 MG Tablet (adqeuate supply)             Metformin 1000 MG Tablet (adqeuate Supply)              Metoprolol 25 mg Tablet ( stop taking)             Lisinopril 40 MG Tablet (adqeuate supply)  Hydrochlorothiazide 12.5 MG Capsule (adqeuate Supply)             Sildenafil (adqeuate supply)             Diltiazem 180 MG (adqeuate Supply)  Patient is due for next adherence delivery on: 08/07/2020. Called patient and reviewed medications and coordinated delivery.  This delivery to include:  Trulicity 1.5MG    Patient declined the following medications (meds) due to (reason)  Atorvastatin 40 MG Tablet (adqeuate supply)   Eliquis 5 MG Tablet (adqeuate supply)             Propanolol 10 MG Tablet (adqeuate supply)             Metformin 1000 MG Tablet (adqeuate Supply)              Metoprolol 25 mg Tablet ( stop taking)             Lisinopril 40 MG Tablet (adqeuate supply)             Hydrochlorothiazide 12.5 MG Capsule (adqeuate Supply)             Sildenafil (adqeuate supply)              Diltiazem 180 MG (adqeuate Supply)  Patient needs refills for None ID.  Confirmed delivery date of 08/07/2020, advised patient that pharmacy will contact them the morning of delivery.    [place] Pharmacist Assistant [phone]

## 2020-07-30 NOTE — Progress Notes (Signed)
Patient ID: Robert [name], male   DOB: 1951-09-25, 69 y.o.   MRN: [id number]   This visit occurred during the SARS-CoV-2 public health emergency.  Safety protocols were in place, including screening questions prior to the visit, additional usage of staff PPE, and extensive cleaning of exam room while observing appropriate contact time as indicated for disinfecting solutions.   HPI: Robert [name] is a 69 y.o.-year-old male, returning for follow-up for  DM2, dx in 2000s, insulin-dependent since 2018, uncontrolled, with complications (PN, ED).  Last visit 4 months ago.  He continues on a more plant-based diet, but he relaxed it as he spent a lot of time at the beach.  Reviewed HbA1c levels: Lab Results  Component Value Date   HGBA1C 6.4 (A) 03/26/2020   HGBA1C 7.0 (A) 11/26/2019   HGBA1C 6.7 (A) 07/30/2019   Pt is on a regimen of: - Trulicity 1.5 mg weekly. - Metformin 1000 mg 2x a day >> 2000 mg with dinner - Glipizide 5 mg before a large dinner- added 12/2018 >> DID NOT USE since last visit We stopped Novolog 70/30 10 units 2x a day in 11/2017, when starting Trulicity.  Pt checks his sugars 1-2 times a day: - am:  116-166 (170s - Holidays) >> 109-139, 142, 186 >> 103-145, 166 - 2h after b'fast: 106-167, 217 >> 150 >> 130, 139 >> 134 >> n/c - before lunch: 107, 140 >> 106,138, 194 (PB) >> n/c - 2h after lunch:  93-161 >> 130-204, 224  >> n/c >> 150 >> n/c - before dinner: 96-125, 144, 199 (icecream) >> 110 >> n/c - 2h after dinner: 112-206, 224 >> 103-155, 187 >> 97-121 >> 145-157 - bedtime: 83-187 >> 85-149 >> 82-128, 140 >> 106-140 - nighttime:51/62, 80-126 >> 129 >> 91 (wine) >> 118 >> n/c Lowest sugar was 28 right after starting insulin >> 77 >> 51 (before stopping Glipizide at night) ...>> 82 >> 103 ; he has hypoglycemia awareness in the 70s. Highest sugar was 224 >> 199 >> 186 >> 166.  Glucometer: ReliOn  Pt's meals are: - Breakfast: whole wheat toast + butter,  1x a wee noon - Lunch: tuna salad or tuna sandwich ([employer] delivery), soft taco, meat + baked beans or potatoes - Dinner: [employer] delivery - Snacks: 1-0: nuts, bananas, other fruit He decreased his alcohol intake. He was swimming 5 times a week before the coronavirus pandemic, currently exercising at home.  -No CKD; last BUN/creatinine:  Lab Results  Component Value Date   BUN 15 02/06/2020   BUN 14 10/21/2019   CREATININE 0.84 02/06/2020   CREATININE 0.88 10/21/2019  On lisinopril 40. -+ HL; last set of lipids: Lab Results  Component Value Date   CHOL 101 02/06/2020   HDL 38.00 (L) 02/06/2020   LDLCALC 45 02/06/2020   LDLDIRECT 77.0 07/07/2015   TRIG 86.0 02/06/2020   CHOLHDL 3 02/06/2020  On Lipitor 40 and co-Q10 - last eye exam was 09/2019: No DR, + cataract -he has numbness and tingling in his feet  He has a h/o arrhythmia (A fib) - wore an event monitor >> now on beta blocker. Cardiologist: Dr. Acie [name].  ROS: Constitutional: + weight gain/no weight loss, no fatigue, no subjective hyperthermia, no subjective hypothermia Eyes: no blurry vision, no xerophthalmia ENT: no sore throat, no nodules palpated in neck, no dysphagia, no odynophagia, no hoarseness Cardiovascular: no CP/no SOB/no palpitations/no leg swelling Respiratory: no cough/no SOB/no wheezing Gastrointestinal: no N/no V/no D/no  C/no acid reflux Musculoskeletal: no muscle aches/+ joint aches Skin: no rashes, no hair loss Neurological: no tremors/+ numbness/+ tingling/no dizziness  I reviewed pt's medications, allergies, PMH, social hx, family hx, and changes were documented in the history of present illness. Otherwise, unchanged from my initial visit note.  Past Medical History:  Diagnosis Date  . Diabetes mellitus type [name]   . HLD (hyperlipidemia)   . HTN (hypertension)   . Hx of colonic polyps   . Paroxysmal atrial fibrillation ([place]) 03/12/2019  . Skin cancer    history of   Past Surgical  History:  Procedure Laterality Date  . COLONOSCOPY    . PATELLAR TENDON REPAIR     Left  . Removal of excess tissue     Right and Left upper eyelids   Social History   Socioeconomic History  . Marital status: Married    Spouse name: Not on file  . Number of children: 2  Social Needs  Occupational History  . Occupation: DVD's  . Occupation: Cabin crew: Self Employed, retired  Tobacco Use  . Smoking status: Former Smoker    Types: Cigars    Last attempt to quit: 02/22/2011  . Smokeless tobacco: Never Used  . Tobacco comment: 2 cigars monthly or less  Substance and Sexual Activity  . Alcohol use: Yes     0.0 oz    Vodka, 2-3 drinks 3x a week   Drugs: No   Current Outpatient Medications on File Prior to Visit  Medication Sig Dispense Refill  . atorvastatin (LIPITOR) 40 MG tablet Take 1 tablet (40 mg total) by mouth daily. 90 tablet 1  . Coenzyme Q10 (COQ10 PO) Take 1 capsule by mouth daily.     [name] Kitchen diltiazem (CARDIZEM CD) 180 MG 24 hr capsule Take 1 capsule (180 mg total) by mouth daily. 90 capsule 3  . Dulaglutide (TRULICITY) 1.5 BT/5.1VO SOPN INJECT 1.5 MG INTO THE SKIN ONCE A WEEK. 4 pen 11  . ELIQUIS 5 MG TABS tablet TAKE ONE TABLET BY MOUTH TWICE DAILY 60 tablet 6  . glucose blood test strip 1 each by Other route daily. Use daily for glucose control - Dx 250.00 100 each 0  . hydrochlorothiazide (MICROZIDE) 12.5 MG capsule Take 1 capsule (12.5 mg total) by mouth daily. 90 capsule 1  . lisinopril (ZESTRIL) 40 MG tablet TAKE 1 AND 1/2 TABLET BY MOUTH EVERY DAY 135 tablet 1  . metFORMIN (GLUCOPHAGE) 1000 MG tablet Take 1 tablet (1,000 mg total) by mouth 2 (two) times daily with a meal. 180 tablet 1  . propranolol (INDERAL) 10 MG tablet Take 1 tablet (10 mg total) by mouth 4 (four) times daily as needed (palpitations, irregular or fast heart rate). 30 tablet 9  . sildenafil (REVATIO) 20 MG tablet TAKE 1 TO 2 TABLETS 2 TO 3 HOURS BEFORE SEX 30 tablet 5   No  current facility-administered medications on file prior to visit.   No Known Allergies Family History  Problem Relation Age of Onset  . Aortic aneurysm Other        family history  . Cancer Other        Breast (1st degree relative)  . Pancreatic cancer Mother 36  . Colon cancer Neg Hx   . Rectal cancer Neg Hx   . Stomach cancer Neg Hx   . Esophageal cancer Neg Hx     PE: BP 136/78 (BP Location: Left Arm, Patient Position: Sitting)   Pulse  75   Wt 207 lb 9.6 oz (94.2 kg)   SpO2 96%   BMI 26.65 kg/m  Wt Readings from Last 3 Encounters:  07/30/20 207 lb 9.6 oz (94.2 kg)  05/25/20 207 lb 6.4 oz (94.1 kg)  03/26/20 204 lb (92.5 kg)   Constitutional: overweight, in NAD Eyes: PERRLA, EOMI, no exophthalmos ENT: moist mucous membranes, no thyromegaly, no cervical lymphadenopathy Cardiovascular: RRR, No MRG Respiratory: CTA B Gastrointestinal: abdomen soft, NT, ND, BS+ Musculoskeletal: no deformities, strength intact in all 4 Skin: moist, warm, no rashes Neurological: no tremor with outstretched hands, DTR normal in all 4  ASSESSMENT: 1. DM2, insulin-dependent, uncontrolled, with complications - PN - ED  2. HL  3.  Overweight  PLAN:  1. Patient with longstanding, previously uncontrolled type 2 diabetes, on oral antidiabetic regimen with Metformin, as needed sulfonylurea, and weekly GLP-1 receptor agonist.  We moved to Metformin at night and his sugars improved afterwards.  At last visit, sugars are mostly at goal with the exception of few hyperglycemic spikes in the morning after rare dietary indiscretions at night or after he forgot to take Metformin the night before.  We did not change his regimen.  We continued his plant-based diet.  HbA1c at that time was 6.4%, improved. -At today's visit, per review of his sugar log, sugars are still mostly at goal in the morning, but he when he was in vacation, they were increasing up to the 140s.  Later in the day, they are usually at  goal.  Therefore, for now, we will not change his regimen, especially since he is now back home and not planning to go back out of town soon.  I did advise him that whenever he eats out or plans a larger meal, he may need to take glipizide before that meal. - I suggested to:  Patient Instructions  Please continue: - Trulicity 1.5 mg weekly. - Metformin 2000 mg with dinner - Glipizide 5 mg before a large dinner  Please return in 6 months with your sugar log.   - we checked his HbA1c: 6.4% (stable, at goal) - advised to check sugars at different times of the day - 1x a day, rotating check times - advised for yearly eye exams >> he is UTD - return to clinic in 6 months  2.  HL -Reviewed latest lipid panel from 01/2020: lipid fractions at goal, with exception of a slightly low HDL: Lab Results  Component Value Date   CHOL 101 02/06/2020   HDL 38.00 (L) 02/06/2020   LDLCALC 45 02/06/2020   LDLDIRECT 77.0 07/07/2015   TRIG 86.0 02/06/2020   CHOLHDL 3 02/06/2020  -He continues Lipitor 40 mg daily and co-Q10.  He does have some joint pains, unclear if from Lipitor.  3.  Overweight -He lost approximately 26 pounds immediately after starting a GLP-1 receptor agonist and switching to a more plant-based diet.   -He then lost 11 pounds before last visit, gained 3 lbs since then -We will continue Trulicity which helps with weight loss  [name], MD PhD [facility] Endocrinology

## 2020-07-30 NOTE — Patient Instructions (Signed)
Please continue: - Trulicity 1.5 mg weekly. - Metformin 2000 mg with dinner (- Glipizide 5 mg before a large dinner)  Please return in 4-6 months with your sugar log.

## 2020-08-07 ENCOUNTER — Telehealth: Payer: Self-pay

## 2020-08-07 ENCOUNTER — Other Ambulatory Visit: Payer: Self-pay

## 2020-08-07 ENCOUNTER — Ambulatory Visit (INDEPENDENT_AMBULATORY_CARE_PROVIDER_SITE_OTHER): Payer: Medicare HMO | Admitting: Internal Medicine

## 2020-08-07 ENCOUNTER — Encounter: Payer: Self-pay | Admitting: Internal Medicine

## 2020-08-07 VITALS — BP 130/62 | HR 77 | Temp 97.7°F | Ht 74.0 in | Wt 209.0 lb

## 2020-08-07 DIAGNOSIS — I10 Essential (primary) hypertension: Secondary | ICD-10-CM | POA: Diagnosis not present

## 2020-08-07 DIAGNOSIS — E119 Type 2 diabetes mellitus without complications: Secondary | ICD-10-CM

## 2020-08-07 DIAGNOSIS — Z23 Encounter for immunization: Secondary | ICD-10-CM | POA: Diagnosis not present

## 2020-08-07 DIAGNOSIS — E785 Hyperlipidemia, unspecified: Secondary | ICD-10-CM

## 2020-08-07 DIAGNOSIS — I480 Paroxysmal atrial fibrillation: Secondary | ICD-10-CM

## 2020-08-07 NOTE — Progress Notes (Signed)
Left a vocie massage to confirmed patient telephone appointment on 08/10/2020 for CCM at 4:00pm with Junius Argyle the Clinical pharmacist.   [place] Pharmacist Assistant [phone]

## 2020-08-07 NOTE — Patient Instructions (Signed)
-  Nice seeing you today!!  -Flu vaccine today.  -Remember to schedule your COVID booster, tetanus and shingles vaccines at your pharmacy.  -Schedule follow up in 6 months for your physical. Please come in fasting that day.

## 2020-08-07 NOTE — Progress Notes (Signed)
Established Patient Office Visit     This visit occurred during the SARS-CoV-2 public health emergency.  Safety protocols were in place, including screening questions prior to the visit, additional usage of staff PPE, and extensive cleaning of exam room while observing appropriate contact time as indicated for disinfecting solutions.    CC/Reason for Visit: Follow-up chronic medical conditions  HPI: Robert [name] is a 69 y.o. male who is coming in today for the above mentioned reasons. Past Medical History is significant for:   1. Hyperlipidemia on a statin.  2. Hypertension that has been well controlled.  3. Type 2 diabetes followed by endocrinology on Trulicity, metformin and as needed glipizide.  4. Atrial fibrillation anticoagulated on Eliquis followed by Dr. Acie Fredrickson.  In March both his brother and his son unfortunately passed away. He still has periods of grieving but feels that overall he is doing better. He has no acute complaints today. He is due for flu vaccine, COVID booster, Tdap and shingles vaccination series.   Past Medical/Surgical History: Past Medical History:  Diagnosis Date   Diabetes mellitus type [name]    HLD (hyperlipidemia)    HTN (hypertension)    Hx of colonic polyps    Paroxysmal atrial fibrillation (HCC) 03/12/2019   Skin cancer    history of    Past Surgical History:  Procedure Laterality Date   COLONOSCOPY     PATELLAR TENDON REPAIR     Left   Removal of excess tissue     Right and Left upper eyelids    Social History:  reports that he quit smoking about 9 years ago. His smoking use included cigars. He has never used smokeless tobacco. He reports current alcohol use. He reports that he does not use drugs.  Allergies: No Known Allergies  Family History:  Family History  Problem Relation Age of Onset   Aortic aneurysm Other        family history   Cancer Other        Breast (1st degree relative)    Pancreatic cancer Mother 6   Colon cancer Neg Hx    Rectal cancer Neg Hx    Stomach cancer Neg Hx    Esophageal cancer Neg Hx      Current Outpatient Medications:    atorvastatin (LIPITOR) 40 MG tablet, Take 1 tablet (40 mg total) by mouth daily., Disp: 90 tablet, Rfl: 1   Coenzyme Q10 (COQ10 PO), Take 1 capsule by mouth daily. , Disp: , Rfl:    Dulaglutide (TRULICITY) 1.5 QM/5.7QI SOPN, INJECT 1.5 MG INTO THE SKIN ONCE A WEEK., Disp: 4 pen, Rfl: 11   ELIQUIS 5 MG TABS tablet, TAKE ONE TABLET BY MOUTH TWICE DAILY, Disp: 60 tablet, Rfl: 6   glucose blood test strip, 1 each by Other route daily. Use daily for glucose control - Dx 250.00, Disp: 100 each, Rfl: 0   hydrochlorothiazide (MICROZIDE) 12.5 MG capsule, Take 1 capsule (12.5 mg total) by mouth daily., Disp: 90 capsule, Rfl: 1   lisinopril (ZESTRIL) 40 MG tablet, TAKE 1 AND 1/2 TABLET BY MOUTH EVERY DAY, Disp: 135 tablet, Rfl: 1   metFORMIN (GLUCOPHAGE) 1000 MG tablet, Take 1 tablet (1,000 mg total) by mouth 2 (two) times daily with a meal., Disp: 180 tablet, Rfl: 1   propranolol (INDERAL) 10 MG tablet, Take 1 tablet (10 mg total) by mouth 4 (four) times daily as needed (palpitations, irregular or fast heart rate)., Disp: 30 tablet, Rfl: 9  sildenafil (REVATIO) 20 MG tablet, TAKE 1 TO 2 TABLETS 2 TO 3 HOURS BEFORE SEX, Disp: 30 tablet, Rfl: 5   diltiazem (CARDIZEM CD) 180 MG 24 hr capsule, Take 1 capsule (180 mg total) by mouth daily. (Patient not taking: Reported on 08/07/2020), Disp: 90 capsule, Rfl: 3  Review of Systems:  Constitutional: Denies fever, chills, diaphoresis, appetite change and fatigue.  HEENT: Denies photophobia, eye pain, redness, hearing loss, ear pain, congestion, sore throat, rhinorrhea, sneezing, mouth sores, trouble swallowing, neck pain, neck stiffness and tinnitus.   Respiratory: Denies SOB, DOE, cough, chest tightness,  and wheezing.   Cardiovascular: Denies chest pain, palpitations and leg  swelling.  Gastrointestinal: Denies nausea, vomiting, abdominal pain, diarrhea, constipation, blood in stool and abdominal distention.  Genitourinary: Denies dysuria, urgency, frequency, hematuria, flank pain and difficulty urinating.  Endocrine: Denies: hot or cold intolerance, sweats, changes in hair or nails, polyuria, polydipsia. Musculoskeletal: Denies myalgias, back pain, joint swelling, arthralgias and gait problem.  Skin: Denies pallor, rash and wound.  Neurological: Denies dizziness, seizures, syncope, weakness, light-headedness, numbness and headaches.  Hematological: Denies adenopathy. Easy bruising, personal or family bleeding history  Psychiatric/Behavioral: Denies suicidal ideation, mood changes, confusion, nervousness, sleep disturbance and agitation    Physical Exam: Vitals:   08/07/20 1005  BP: 130/62  Pulse: 77  Temp: 97.7 F (36.5 C)  TempSrc: Oral  SpO2: 98%  Weight: 209 lb (94.8 kg)  Height: 6\' 2"  (1.88 m)    Body mass index is 26.83 kg/m.   Constitutional: NAD, calm, comfortable Eyes: PERRL, lids and conjunctivae normal ENMT: Mucous membranes are moist.  Respiratory: clear to auscultation bilaterally, no wheezing, no crackles. Normal respiratory effort. No accessory muscle use.  Cardiovascular: Regular rate and rhythm, no murmurs / rubs / gallops. No extremity edema.  Neurologic: Grossly intact and nonfocal Psychiatric: Normal judgment and insight. Alert and oriented x 3. Normal mood.    Impression and Plan:  Type 2 diabetes mellitus without complication, without long-term current use of insulin ([place]) -followed by endocrinology, is well controlled with an A1c of 6.4 earlier this month.  Need for influenza vaccination  - Plan: Flu Vaccine QUAD High Dose(Fluad)  Paroxysmal atrial fibrillation (HCC) -currently appears to be in sinus rhythm, he is anticoagulated on Eliquis, rate controlled on diltiazem and as needed propranolol.  Hyperlipidemia,  unspecified hyperlipidemia type -last LDL was 45 in April 2021, he remains on atorvastatin 40 mg daily.  Essential hypertension -well-controlled on diltiazem, lisinopril, hydrochlorothiazide.    Patient Instructions  -Nice seeing you today!!  -Flu vaccine today.  -Remember to schedule your COVID booster, tetanus and shingles vaccines at your pharmacy.  -Schedule follow up in 6 months for your physical. Please come in fasting that day.     Lelon Frohlich, MD [facility]

## 2020-08-10 ENCOUNTER — Telehealth: Payer: Medicare HMO

## 2020-08-28 ENCOUNTER — Telehealth: Payer: Self-pay | Admitting: Pharmacist

## 2020-08-28 NOTE — Chronic Care Management (AMB) (Signed)
I left the patient a message about his upcoming appointment on 08/31/2020 @ 3: 00 pm with the clinical pharmacist. He was asked to please have all medication on had to review the pharmacist.   Maia Breslow, [place] Assistant [phone]

## 2020-08-31 ENCOUNTER — Ambulatory Visit: Payer: Medicare HMO | Admitting: Pharmacist

## 2020-08-31 DIAGNOSIS — I10 Essential (primary) hypertension: Secondary | ICD-10-CM

## 2020-08-31 DIAGNOSIS — I480 Paroxysmal atrial fibrillation: Secondary | ICD-10-CM

## 2020-08-31 NOTE — Chronic Care Management (AMB) (Signed)
Chronic Care Management Pharmacy  Name: Robert [name]  MRN: [id number] DOB: 12/20/1950  Chief Complaint/ HPI  Robert [name],  69 y.o. , male presents for their Follow-Up CCM visit with the clinical pharmacist via telephone due to COVID-19 Pandemic.  PCP : Isaac Bliss, Rayford Halsted, MD  Their chronic conditions include: Afib, HTN, HLD, type 2 diabetes, ED  Office Visits: 08/07/20 Lelon Frohlich, MD: Patient presented for chronic conditions follow up. Influenza vaccine given in office. No medication changes made. Follow up in 6 months.  02/06/20 OV - Routine health exam. A1c under controlled at 7% and followed by endocrinology. Afib followed by Dr. Acie Fredrickson. BP stable. No changes with medications  Consult Visit: 07/30/20 Layla Maw, MD: Patient presented for DM follow up. A1c remains at 6.4%. Recommended taking glipizide before the meal if he plans to eat out or plans a larger meal. Follow up in 6 months.  07/10/20 Patient sent a message to cardiology about his urine being brown with metoprolol. Prescribed diltiazem to replace metoprolol.   05/25/20 Grayland Jack, MD (cardiology): Patient presented for Afib follow up. Patient reported palpitations and thought it might be due to increased stress. Prescribed metoprolol succinate 25 mg 1 tablet daily and continue to use propranolol PRN. Follow up in 1 year.  03/26/20 OV ([place], IM) - DM f/u. Moved metformin dose at night since sugars are higher in the morning. A1c at 6.4%. Lost 26 lbs since starting on GLP-1 RA and plant based diet.  Medications: Outpatient Encounter Medications as of 08/31/2020  Medication Sig  . atorvastatin (LIPITOR) 40 MG tablet Take 1 tablet (40 mg total) by mouth daily.  . Coenzyme Q10 (COQ10 PO) Take 1 capsule by mouth daily.   [name] Kitchen diltiazem (CARDIZEM CD) 180 MG 24 hr capsule Take 1 capsule (180 mg total) by mouth daily. (Patient not taking: Reported on 08/07/2020)  . Dulaglutide  (TRULICITY) 1.5 QM/0.8QP SOPN INJECT 1.5 MG INTO THE SKIN ONCE A WEEK.  [name] Kitchen ELIQUIS 5 MG TABS tablet TAKE ONE TABLET BY MOUTH TWICE DAILY  . glucose blood test strip 1 each by Other route daily. Use daily for glucose control - Dx 250.00  . hydrochlorothiazide (MICROZIDE) 12.5 MG capsule Take 1 capsule (12.5 mg total) by mouth daily.  [name] Kitchen lisinopril (ZESTRIL) 40 MG tablet TAKE 1 AND 1/2 TABLET BY MOUTH EVERY DAY  . metFORMIN (GLUCOPHAGE) 1000 MG tablet Take 1 tablet (1,000 mg total) by mouth 2 (two) times daily with a meal.  . propranolol (INDERAL) 10 MG tablet Take 1 tablet (10 mg total) by mouth 4 (four) times daily as needed (palpitations, irregular or fast heart rate).  . sildenafil (REVATIO) 20 MG tablet TAKE 1 TO 2 TABLETS 2 TO 3 HOURS BEFORE SEX   No facility-administered encounter medications on file as of 08/31/2020.     Transportation Needs: No Transportation Needs  . Lack of Transportation (Medical): No  . Lack of Transportation (Non-Medical): No     Physical Activity: Sufficiently Active  . Days of Exercise per Week: 5 days  . Minutes of Exercise per Session: 60 min    Current Diagnosis/Assessment:  Goals Addressed            This Visit's Progress   . Chronic Care Management       CARE PLAN ENTRY  Current Barriers:  . Chronic Disease Management support, education, and care coordination needs related to Hypertension, Hyperlipidemia, and Diabetes   Hypertension BP Readings from Last 3  Encounters:  03/26/20 (!) 142/80  02/06/20 120/70  11/26/19 128/80   . Pharmacist Clinical Goal(s): o Over the next 120 days, patient will work with PharmD and providers to maintain BP goal <140/90 . Current regimen:  . HCTZ 12.5 mg 1 capsule daily . Lisinopril 40 mg 1 tablet daily . Propranolol 10 mg 1 tablet 4 times daily as needed  . Interventions: o Discussed low salt diet and exercising as tolerated extensively . Patient self care activities - Over the next 120 days, patient  will: o Check BP once a week, document, and provide at future appointments o Ensure daily salt intake < 2300 mg/day o Continue with plant-based diet, exercise, and weight loss  Hyperlipidemia Lab Results  Component Value Date/Time   LDLCALC 45 02/06/2020 09:56 AM   LDLDIRECT 77.0 07/07/2015 09:30 AM   . Pharmacist Clinical Goal(s): o Over the next 120 days, patient will work with PharmD and providers to maintain LDL goal < 100 . Current regimen:  o Atorvastatin 40 mg 1 tablet once daily HS . Interventions: o Discussed lowering cholesterol through diet by: [name] Kitchen Limiting foods with cholesterol such as liver and other organ meats, egg yolks, shrimp, and whole milk dairy products . Avoiding saturated fats and trans fats and incorporating healthier fats, such as lean meat, nuts, and unsaturated oils like canola and olive oils . Eating foods with soluble fiber such as whole-grain cereals such as oatmeal and oat bran, fruits such as apples, bananas, oranges, pears, and prunes, legumes such as kidney beans, lentils, chick peas, black-eyed peas, and lima beans, and green leafy vegetables . Limiting alcohol intake . Patient self care activities - Over the next 120 days, patient will: o Continue with plant-based diet, exercise, and weight loss  Diabetes Lab Results  Component Value Date/Time   HGBA1C 6.4 (A) 03/26/2020 10:28 AM   HGBA1C 7.0 (A) 11/26/2019 02:49 PM   HGBA1C 7.6 (H) 10/04/2017 12:06 PM   HGBA1C 7.7 (H) 07/05/2017 08:34 AM   . Pharmacist Clinical Goal(s): o Over the next 120 days, patient will work with PharmD and providers to maintain A1c goal <7% . Current regimen:  . Metformin 1000 mg 1 tablet twice a day with a meal . Trulicity 1.5 OE/7.0 mL inject 1.5 mg into the skin once a week . Interventions: o Discussed carbohydrate counting and exercising as tolerated extensively . Patient self care activities - Over the next 120 days, patient will: o Check blood sugar twice daily,  document, and provide at future appointments o Contact provider with any episodes of hypoglycemia  Medication management . Pharmacist Clinical Goal(s): o Over the next 120 days, patient will work with PharmD and providers to achieve optimal medication adherence . Current pharmacy: [facility] . Interventions o Comprehensive medication review performed. o Utilize [facility] pharmacy for medication synchronization, packaging and delivery . Patient self care activities - Over the next 120 days, patient will: o Focus on medication adherence by medication synchronization and delivery o Take medications as prescribed o Report any questions or concerns to PharmD and/or provider(s)  Please see past updates related to this goal by clicking on the "Past Updates" button in the selected goal        SDOH Interventions     Most Recent Value  SDOH Interventions  Financial Strain Interventions Intervention Not Indicated  Transportation Interventions Intervention Not Indicated      Paroxysmal AFIB   Rate controlled Patient has failed these meds in past: None Patient is currently controlled  on the following medications:   Eliquis 5 mg 1 tablet twice daily  Propranolol 10 mg 1 tablet by mouth 4 times daily as needed   We discussed: Monitoring for signs of bleeding such as unexplained and excessive bleeding from a cut or injury, easy or excessive bruising, blood in urine or stools, and nosebleeds without a known cause  Plan  Continue current medications   Hypertension  BP goal < 140/90   Office blood pressures are  BP Readings from Last 3 Encounters:  08/07/20 130/62  07/30/20 136/78  05/25/20 126/74    Patient has failed these meds in the past: none Patient is currently controlled on the following medications:  . HCTZ 12.5 mg 1 capsule daily  . Lisinopril 40 mg 1 tablet daily . Diltiazem 180 mg 1 tablet daily (has not started) . Propranolol 10 mg 1 tablet 4 times daily as  needed   Patient checks BP at home weekly  Patient home BP readings are ranging: 110/68, 120/70s  We discussed diet and exercise extensively.  -Exercise: patient goes swimming every Mon, Wed and Fri and has done so for the past 8 weeks; he also doesn't sit down for very long at home and mows the yard -Diet: patient doesn't cook with salt and very rarely uses salt; he does admit to occasionally having frozen veggies but has fresh veggies more often -DASH eating plan recommendations: . Emphasizes vegetables, fruits, and whole-grains . Includes fat-free or low-fat dairy products, fish, poultry, beans, nuts, and vegetable oils . Limits foods that are high in saturated fat. These foods include fatty meats, full-fat dairy products, and tropical oils such as coconut, palm kernel, and palm oils. . Limits sugar-sweetened beverages and sweets . Limiting sodium intake to < 1500 mg/day   Plan   Continue current medications and control with diet and exercise    Hyperlipidemia  LDL goal < 70  Lipid Panel     Component Value Date/Time   CHOL 101 02/06/2020 0956   TRIG 86.0 02/06/2020 0956   HDL 38.00 (L) 02/06/2020 0956   CHOLHDL 3 02/06/2020 0956   VLDL 17.2 02/06/2020 0956   LDLCALC 45 02/06/2020 0956   LDLDIRECT 77.0 07/07/2015 0930     The ASCVD Risk score (Goff DC Jr., et al., 2013) failed to calculate for the following reasons:   The valid total cholesterol range is 130 to 320 mg/dL   Patient has failed these meds in past: rosuvastatin Patient is currently controlled on the following medications:  . Atorvastatin 40 mg 1 tablet once daily HS  We discussed: diet and exercise extensively  Plan  Continue control with diet and exercise    Diabetes  A1c goal < 7  Recent Relevant Labs: Lab Results  Component Value Date/Time   HGBA1C 6.4 (A) 07/30/2020 10:24 AM   HGBA1C 6.4 (A) 03/26/2020 10:28 AM   HGBA1C 7.6 (H) 10/04/2017 12:06 PM   HGBA1C 7.7 (H) 07/05/2017 08:34 AM    GFR 90.48 02/06/2020 09:56 AM   GFR 94.02 07/23/2018 12:20 PM   MICROALBUR 0.9 07/23/2018 12:20 PM   MICROALBUR 3.7 (H) 03/01/2017 10:40 AM     Checking BG: 2x per Day Twice a day - AM and bedtime  Avg FBG Readings: 108, 109, 110 Denies BGs > 180 (with exception of 210)   Patient has failed these meds in past: glipizide Patient is currently controlled on the following medications:  [name] Kitchen Metformin 1000 mg 1 tablet twice a day with a meal (a  couple of times a month) . Trulicity 1.5 VC/9.4 mL inject 1.5 mg into the skin once a week  Last diabetic Eye exam:  Lab Results  Component Value Date/Time   HMDIABEYEEXA No Retinopathy 09/24/2019 12:00 AM    Last diabetic Foot exam:  Lab Results  Component Value Date/Time   HMDIABFOOTEX Done 03/31/2010 12:00 AM     We discussed: diet and exercise extensively -Diet: patient stinks to plant based diet pretty well and doesn't eat much meat; he limits his intake of fruit as he knows that will increase blood sugars -Recommended swapping out regular pasta for lower carb alternatives such as Banza   Plan  Continue current medications and control with diet and exercise   Erectile Dysfunction   Patient has failed these meds in past: none Patient is currently controlled on the following medications:  . Sildenafil 20 mg 1-2 tablets 2-3 hrs before sex  Patient reports using it once in awhile.   Plan  Continue current medications  OTCs/Health Maintenance   Patient is currently controlled on the following medications: . Coenzyme Q10 1 tablet daily . Ginkgo biloba 1 tablet daily . Multivitamin 1 tablet daily  Plan  Continue current medications    Vaccines   Reviewed and discussed patient's vaccination history.    Immunization History  Administered Date(s) Administered  . Fluad Quad(high Dose 65+) 07/02/2019, 08/07/2020  . Influenza Split 09/26/2011, 11/26/2012  . Influenza Whole 08/21/2007  . Influenza, High Dose Seasonal PF  07/10/2017, 07/23/2018  . Influenza,inj,Quad PF,6+ Mos 08/06/2013, 07/28/2015  . PFIZER SARS-COV-2 Vaccination 01/24/2020, 02/17/2020  . Pneumococcal Conjugate-13 03/01/2017  . Pneumococcal Polysaccharide-23 07/23/2018  . Td 08/21/2007  . Zoster 02/05/2013    Plan  Recommended patient receive shingles vaccine in the office/pharmacy.   Medication Management   Pt uses [facility] for all medications Uses pill box? Yes - 2 week at a time Pt endorses 100% compliance  We discussed: Discussed benefits of medication synchronization, packaging and delivery as well as enhanced pharmacist oversight with [facility].  Plan  Utilize [facility] pharmacy for medication synchronization, packaging and delivery   Follow up: 4 month phone visit   Jeni Salles, PharmD Clinical Pharmacist [place] at [place] [phone]

## 2020-09-30 ENCOUNTER — Telehealth: Payer: Self-pay | Admitting: Pharmacist

## 2020-10-02 NOTE — Chronic Care Management (AMB) (Signed)
Chronic Care Management Pharmacy Assistant   Name: AVEN CHRISTEN  MRN: [id number] DOB: 12-Sep-1951  Reason for Encounter: Disease State and Medication Review  PCP : Isaac Bliss, Rayford Halsted, MD  Allergies:  No Known Allergies  Medications: Outpatient Encounter Medications as of 09/30/2020  Medication Sig  . atorvastatin (LIPITOR) 40 MG tablet Take 1 tablet (40 mg total) by mouth daily.  . Coenzyme Q10 (COQ10 PO) Take 1 capsule by mouth daily.   [name] Kitchen diltiazem (CARDIZEM CD) 180 MG 24 hr capsule Take 1 capsule (180 mg total) by mouth daily. (Patient not taking: Reported on 08/07/2020)  . Dulaglutide (TRULICITY) 1.5 DD/2.2GU SOPN INJECT 1.5 MG INTO THE SKIN ONCE A WEEK.  [name] Kitchen ELIQUIS 5 MG TABS tablet TAKE ONE TABLET BY MOUTH TWICE DAILY  . glucose blood test strip 1 each by Other route daily. Use daily for glucose control - Dx 250.00  . hydrochlorothiazide (MICROZIDE) 12.5 MG capsule Take 1 capsule (12.5 mg total) by mouth daily.  [name] Kitchen lisinopril (ZESTRIL) 40 MG tablet TAKE 1 AND 1/2 TABLET BY MOUTH EVERY DAY  . metFORMIN (GLUCOPHAGE) 1000 MG tablet Take 1 tablet (1,000 mg total) by mouth 2 (two) times daily with a meal.  . propranolol (INDERAL) 10 MG tablet Take 1 tablet (10 mg total) by mouth 4 (four) times daily as needed (palpitations, irregular or fast heart rate).  . sildenafil (REVATIO) 20 MG tablet TAKE 1 TO 2 TABLETS 2 TO 3 HOURS BEFORE SEX   No facility-administered encounter medications on file as of 09/30/2020.    Current Diagnosis: Patient Active Problem List   Diagnosis Date Noted  . Long term current use of anticoagulant -Eliquis 03/12/2019  . Paroxysmal atrial fibrillation ([place]) 03/12/2019  . Overweight (BMI 25.0-29.9) 01/03/2019  . Type 2 diabetes mellitus without complication, without long-term current use of insulin ([place]) 07/23/2018  . BPH associated with nocturia 03/01/2017  . Snoring 03/12/2013  . Irregular heart rate 06/23/2011  . Hx of colonic polyps   .  ERECTILE DYSFUNCTION 12/19/2008  . SKIN CANCER, HX OF 12/19/2008  . Hyperlipidemia 08/21/2007  . Essential hypertension 08/21/2007    Goals Addressed   None    Reviewed chart for medication changes ahead of medication coordination call. No OVs, Consults, or hospital visits since last care coordination call/Pharmacist visit.  No medication changes indicated   BP Readings from Last 3 Encounters:  08/07/20 130/62  07/30/20 136/78  05/25/20 126/74    Lab Results  Component Value Date   HGBA1C 6.4 (A) 07/30/2020     Patient obtains medications through Vials  90 Days   Last adherence delivery included: . Atorvastatin (LIPITOR) 40 mg: one tablet daily . Metformin (GLUCOPHAGE) 1000 mg: I tablet by mouth twice a day . ELIQUIS 5 mg: Twice daily  I spoke with the patient and review medications. There are no changes in medications currently. The patient is taking the following medications: He did state he stop for about a week when he went out of town. . Atorvastatin (LIPITOR) 40 mg: one tablet daily . Diltiazem (CARDIZEM CD) 180 MG 24 hr: One capsule daily . Hydrochlorothiazide (MICROZIDE) 12.5 mg: one capsule daily . Lisinopril (ZESTRIL) 40 mg: take 1.5 tablets daily . Metformin (GLUCOPHAGE) 1000 mg: I tablet by mouth twice a day . Propranolol (INDERAL) 10 mg: Take 1 tablet (10 mg total) by mouth 4 (four) times daily as needed . Dulaglutide (TRULICITY) 1.5 RK/2.7 ML SOPN: inject into skin one a week . ELIQUIS  5 mg: Twice daily  The patient does not need refill currently.  Confirmed delivery date of 01-05-2021, advised patient that pharmacy will contact them the morning of delivery.   Follow-Up:  Coordination of Enhanced Pharmacy Services and Pharmacist Review   I was unable to do a blood pressure assessment today. The patient had started the regiment of medication since his last visit. I will call back in 3 weeks for a follow-up.  Maia Breslow, [place]  Assistant [phone]

## 2020-11-01 ENCOUNTER — Other Ambulatory Visit: Payer: Self-pay | Admitting: Internal Medicine

## 2020-11-01 DIAGNOSIS — E785 Hyperlipidemia, unspecified: Secondary | ICD-10-CM

## 2020-11-03 ENCOUNTER — Telehealth: Payer: Self-pay | Admitting: Pharmacist

## 2020-11-04 NOTE — Chronic Care Management (AMB) (Signed)
Chronic Care Management Pharmacy Assistant   Name: Robert [name]  MRN: [id number] DOB: 09-09-1951  Reason for Encounter: Medication Review  PCP : Isaac Bliss, Rayford Halsted, MD  Allergies:  No Known Allergies  Medications: Outpatient Encounter Medications as of 11/03/2020  Medication Sig  . atorvastatin (LIPITOR) 40 MG tablet TAKE ONE TABLET BY MOUTH ONCE DAILY  . Coenzyme Q10 (COQ10 PO) Take 1 capsule by mouth daily.   [name] Kitchen diltiazem (CARDIZEM CD) 180 MG 24 hr capsule Take 1 capsule (180 mg total) by mouth daily. (Patient not taking: Reported on 08/07/2020)  . Dulaglutide (TRULICITY) 1.5 AY/3.0ZS SOPN INJECT 1.5 MG INTO THE SKIN ONCE A WEEK.  [name] Kitchen ELIQUIS 5 MG TABS tablet TAKE ONE TABLET BY MOUTH TWICE DAILY  . glucose blood test strip 1 each by Other route daily. Use daily for glucose control - Dx 250.00  . hydrochlorothiazide (MICROZIDE) 12.5 MG capsule Take 1 capsule (12.5 mg total) by mouth daily.  [name] Kitchen lisinopril (ZESTRIL) 40 MG tablet TAKE 1 AND 1/2 TABLET BY MOUTH EVERY DAY  . metFORMIN (GLUCOPHAGE) 1000 MG tablet Take 1 tablet (1,000 mg total) by mouth 2 (two) times daily with a meal.  . propranolol (INDERAL) 10 MG tablet Take 1 tablet (10 mg total) by mouth 4 (four) times daily as needed (palpitations, irregular or fast heart rate).  . sildenafil (REVATIO) 20 MG tablet TAKE 1 TO 2 TABLETS 2 TO 3 HOURS BEFORE SEX   No facility-administered encounter medications on file as of 11/03/2020.    Current Diagnosis: Patient Active Problem List   Diagnosis Date Noted  . Long term current use of anticoagulant -Eliquis 03/12/2019  . Paroxysmal atrial fibrillation ([place]) 03/12/2019  . Overweight (BMI 25.0-29.9) 01/03/2019  . Type 2 diabetes mellitus without complication, without long-term current use of insulin ([place]) 07/23/2018  . BPH associated with nocturia 03/01/2017  . Snoring 03/12/2013  . Irregular heart rate 06/23/2011  . Hx of colonic polyps   . ERECTILE DYSFUNCTION  12/19/2008  . SKIN CANCER, HX OF 12/19/2008  . Hyperlipidemia 08/21/2007  . Essential hypertension 08/21/2007    Goals Addressed   None    Reviewed chart for medication changes ahead of medication coordination call. No OVs, Consults, or hospital visits since last care coordination call/Pharmacist visit.  No medication changes indicated.  BP Readings from Last 3 Encounters:  08/07/20 130/62  07/30/20 136/78  05/25/20 126/74    Lab Results  Component Value Date   HGBA1C 6.4 (A) 07/30/2020     Patient obtains medications through Vials  90 Days  Last adherence delivery included:  Atorvastatin (LIPITOR) 40 mg: one tablet daily  Metformin (GLUCOPHAGE) 1000 mg: I tablet by mouth twice a day  ELIQUIS 5 mg: Twice daily  Patient declined the following medication last month due to PRN use/additional supply on hand. . Atorvastatin (LIPITOR) 40 mg: one tablet daily . Diltiazem (CARDIZEM CD) 180 MG 24 hr: One capsule daily . Hydrochlorothiazide (MICROZIDE) 12.5 mg: one capsule daily . Lisinopril (ZESTRIL) 40 mg: take 1.5 tablets daily . Metformin (GLUCOPHAGE) 1000 mg: I tablet by mouth twice a day . Propranolol (INDERAL) 10 mg: Take 1 tablet (10 mg total) by mouth 4 (four) times daily as needed . Dulaglutide (TRULICITY) 1.5 WF/0.9NA SOPN: inject into skin one a week . ELIQUIS 5 mg: Twice daily  Patient is due for next adherence delivery on: 11/04/2020. Called patient and reviewed medications and coordinated delivery.  This delivery to include: . Atorvastatin (LIPITOR) 40  mg: one tablet daily . Dulaglutide (TRULICITY) 1.5 HY/8.5OY SOPN: inject into skin one a week . sildenafil (REVATIO) 20 MG tablet: take 1-2 tablets 2-3 hours before sex.  I spoke with the patient and review medications. There are no changes in medications currently. Patient declined these last month due to PRN use/additional supply on hand.The patient is taking the following medications: . Diltiazem (CARDIZEM  CD) 180 MG 24 hr: One capsule daily . Hydrochlorothiazide (MICROZIDE) 12.5 mg: one capsule daily . Lisinopril (ZESTRIL) 40 mg: take 1.5 tablets daily . Metformin (GLUCOPHAGE) 1000 mg: I tablet by mouth twice a day . Propranolol (INDERAL) 10 mg: Take 1 tablet (10 mg total) by mouth 4 (four) times daily as needed . ELIQUIS 5 mg: Twice daily   He currently doe snot need refills Confirmed delivery date of 11/04/2020, advised patient that pharmacy will contact them the morning of delivery.  Follow-Up:  Care Coordination with Outside Provider and Pharmacist Review   Maia Breslow, [place] Assistant [phone]

## 2020-11-19 ENCOUNTER — Other Ambulatory Visit: Payer: Self-pay | Admitting: Cardiovascular Disease

## 2020-11-19 NOTE — Telephone Encounter (Signed)
Eliquis 5mg  refill request received. Patient is 70 years old, weight-94.8kg, Crea-0.84 on 02/06/2020, Diagnosis-Afib, and last seen by Dr. Acie Fredrickson on 05/25/2020. Dose is appropriate based on dosing criteria. Will send in refill to requested pharmacy.

## 2020-11-20 DIAGNOSIS — E1136 Type 2 diabetes mellitus with diabetic cataract: Secondary | ICD-10-CM | POA: Diagnosis not present

## 2020-11-20 DIAGNOSIS — H2513 Age-related nuclear cataract, bilateral: Secondary | ICD-10-CM | POA: Diagnosis not present

## 2020-11-20 DIAGNOSIS — H524 Presbyopia: Secondary | ICD-10-CM | POA: Diagnosis not present

## 2020-11-20 DIAGNOSIS — H43813 Vitreous degeneration, bilateral: Secondary | ICD-10-CM | POA: Diagnosis not present

## 2020-11-20 DIAGNOSIS — E119 Type 2 diabetes mellitus without complications: Secondary | ICD-10-CM | POA: Diagnosis not present

## 2020-11-20 LAB — HM DIABETES EYE EXAM

## 2020-11-26 ENCOUNTER — Encounter: Payer: Self-pay | Admitting: Internal Medicine

## 2020-12-03 ENCOUNTER — Other Ambulatory Visit: Payer: Self-pay | Admitting: Internal Medicine

## 2020-12-03 DIAGNOSIS — E119 Type 2 diabetes mellitus without complications: Secondary | ICD-10-CM

## 2020-12-11 ENCOUNTER — Encounter: Payer: Self-pay | Admitting: Internal Medicine

## 2020-12-21 ENCOUNTER — Other Ambulatory Visit: Payer: Self-pay | Admitting: Internal Medicine

## 2020-12-21 DIAGNOSIS — E785 Hyperlipidemia, unspecified: Secondary | ICD-10-CM

## 2020-12-23 ENCOUNTER — Telehealth: Payer: Self-pay | Admitting: Pharmacist

## 2020-12-23 NOTE — Chronic Care Management (AMB) (Signed)
Chronic Care Management Pharmacy Assistant   Name: Robert [name]  MRN: [id number] DOB: 05-Oct-1951  Reason for Encounter: Medication Review  Patient Questions:  1.  Have you seen any other providers since your last visit? No  2.  Any changes in your medicines or health? No    PCP : Isaac Bliss, Rayford Halsted, MD  Allergies:  No Known Allergies  Medications: Outpatient Encounter Medications as of 12/23/2020  Medication Sig  . atorvastatin (LIPITOR) 40 MG tablet TAKE ONE TABLET BY MOUTH ONCE DAILY  . Coenzyme Q10 (COQ10 PO) Take 1 capsule by mouth daily.   [name] Kitchen diltiazem (CARDIZEM CD) 180 MG 24 hr capsule Take 1 capsule (180 mg total) by mouth daily. (Patient not taking: Reported on 08/07/2020)  . Dulaglutide (TRULICITY) 1.5 YY/5.0PT SOPN INJECT 1.5 MG INTO THE SKIN ONCE A WEEK.  [name] Kitchen ELIQUIS 5 MG TABS tablet TAKE ONE TABLET BY MOUTH TWICE DAILY  . glucose blood test strip 1 each by Other route daily. Use daily for glucose control - Dx 250.00  . hydrochlorothiazide (MICROZIDE) 12.5 MG capsule Take 1 capsule (12.5 mg total) by mouth daily.  [name] Kitchen lisinopril (ZESTRIL) 40 MG tablet TAKE 1 AND 1/2 TABLET BY MOUTH EVERY DAY  . metFORMIN (GLUCOPHAGE) 1000 MG tablet TAKE ONE TABLET BY MOUTH TWICE DAILY WITH FOOD  . propranolol (INDERAL) 10 MG tablet Take 1 tablet (10 mg total) by mouth 4 (four) times daily as needed (palpitations, irregular or fast heart rate).  . sildenafil (REVATIO) 20 MG tablet TAKE 1 TO 2 TABLETS 2 TO 3 HOURS BEFORE SEX   No facility-administered encounter medications on file as of 12/23/2020.    Current Diagnosis: Patient Active Problem List   Diagnosis Date Noted  . Long term current use of anticoagulant -Eliquis 03/12/2019  . Paroxysmal atrial fibrillation ([place]) 03/12/2019  . Overweight (BMI 25.0-29.9) 01/03/2019  . Type 2 diabetes mellitus without complication, without long-term current use of insulin ([place]) 07/23/2018  . BPH associated with nocturia 03/01/2017   . Snoring 03/12/2013  . Irregular heart rate 06/23/2011  . Hx of colonic polyps   . ERECTILE DYSFUNCTION 12/19/2008  . SKIN CANCER, HX OF 12/19/2008  . Hyperlipidemia 08/21/2007  . Essential hypertension 08/21/2007    Goals Addressed   None    Reviewed chart for medication changes ahead of medication coordination call.  BP Readings from Last 3 Encounters:  08/07/20 130/62  07/30/20 136/78  05/25/20 126/74    Lab Results  Component Value Date   HGBA1C 6.4 (A) 07/30/2020     Patient obtains medications through Vials  90 Days  Last adherence delivery included:  . Atorvastatin (LIPITOR) 40 mg: one tablet daily . Dulaglutide (TRULICITY) 1.5 WS/5.6 ML SOPN: inject into skin one a week . Sildenafil (REVATIO) 20 MG tablet: take 1-2 tablets 2-3 hours before sex.  Patient declined the following medications last month due to PRN use/additional supply on hand. . Diltiazem (CARDIZEM CD) 180 MG 24 hr: one capsule daily . Hydrochlorothiazide (MICROZIDE) 12.5 mg: one capsule daily . Lisinopril (ZESTRIL) 40 mg: take 1.5 tablets daily . Metformin (GLUCOPHAGE) 1000 mg: one tablet by mouth twice a day . Propranolol (INDERAL) 10 mg: Take 1 tablet (10 mg total) by mouth 4 (four) times daily as needed . ELIQUIS 5 mg: one tablet twice daily  Patient is due for next adherence delivery on: 12/26/2020. Called patient and reviewed medications and coordinated delivery. This delivery to include: [name] Kitchen Hydrochlorothiazide (MICROZIDE) 12.5 mg: one capsule  daily . Lisinopril (ZESTRIL) 40 mg: take 1.5 tablets daily . ELIQUIS 5 mg: one tablet twice daily . Dulaglutide (TRULICITY) 1.5 CY/8.1 ML SOPN: inject into skin one a week  I spoke with the patient and review medications. There are no changes in medications currently. Patient declined these medication this month due to PRN use/additional supply on hand.The patient is taking the following medications: . Diltiazem (CARDIZEM CD) 180 MG 24 hr: one capsule  daily ( patient not taking) . Metformin (GLUCOPHAGE) 1000 mg: one tablet by mouth twice a day . Propranolol (INDERAL) 10 mg: Take 1 tablet (10 mg total) by mouth 4 (four) times daily as needed . Atorvastatin (LIPITOR) 40 mg: one tablet daily . Sildenafil (REVATIO) 20 MG tablet: take 1-2 tablets 2-3 hours before sex.  Patient needs refills for: . Hydrochlorothiazide (MICROZIDE) 12.5 mg: one capsule daily   Confirmed delivery date of 12/30/2020, advised patient that pharmacy will contact them the morning of delivery. Follow-Up:  Coordination of Enhanced Pharmacy Services and Pharmacist Review   Maia Breslow, [place] Assistant [phone]

## 2020-12-25 ENCOUNTER — Telehealth: Payer: Self-pay | Admitting: *Deleted

## 2020-12-25 DIAGNOSIS — I10 Essential (primary) hypertension: Secondary | ICD-10-CM

## 2020-12-25 MED ORDER — HYDROCHLOROTHIAZIDE 12.5 MG PO CAPS: 12.5000 mg | ORAL_CAPSULE | Freq: Every day | ORAL | 1 refills | Status: DC

## 2020-12-25 NOTE — Telephone Encounter (Signed)
-----   Message from Dondra Prader sent at 12/25/2020  8:24 AM EST ----- Regarding: Medicaiton Refill Good  Morning,  Can you please send the following prescription (s) to [facility]?  Hydrochlorothiazide (MICROZIDE) 12.5 mg   If the provider does not write this medication, please let me know.   Have a wonderful day.  Maia Breslow, [place] Assistant [phone]

## 2021-01-06 ENCOUNTER — Telehealth: Payer: Self-pay | Admitting: Pharmacist

## 2021-01-06 NOTE — Chronic Care Management (AMB) (Signed)
I left the patient a message about his upcoming appointment on 01/07/2021 @ 3:00 pm with the clinical pharmacist. He was asked to please have all medication on hand to review with the pharmacist.  Robert [name]) Mare Ferrari, [place] Assistant [phone]

## 2021-01-07 ENCOUNTER — Telehealth: Payer: Medicare HMO

## 2021-01-25 ENCOUNTER — Telehealth: Payer: Self-pay | Admitting: Pharmacist

## 2021-01-25 NOTE — Chronic Care Management (AMB) (Signed)
Chronic Care Management Pharmacy Assistant   Name: Robert [name]  MRN: [id number] DOB: 06-04-1951  Reason for Encounter: Medication Review-Medication Coordination Call   Recent office visits:  None  Recent consult visits:  None  Hospital visits:  None in previous 6 months  Medications: Outpatient Encounter Medications as of 01/25/2021  Medication Sig  . atorvastatin (LIPITOR) 40 MG tablet TAKE ONE TABLET BY MOUTH ONCE DAILY  . Coenzyme Q10 (COQ10 PO) Take 1 capsule by mouth daily.   [name] Kitchen diltiazem (CARDIZEM CD) 180 MG 24 hr capsule Take 1 capsule (180 mg total) by mouth daily. (Patient not taking: Reported on 08/07/2020)  . Dulaglutide (TRULICITY) 1.5 JY/7.8GN SOPN INJECT 1.5 MG INTO THE SKIN ONCE A WEEK.  [name] Kitchen ELIQUIS 5 MG TABS tablet TAKE ONE TABLET BY MOUTH TWICE DAILY  . glucose blood test strip 1 each by Other route daily. Use daily for glucose control - Dx 250.00  . hydrochlorothiazide (MICROZIDE) 12.5 MG capsule Take 1 capsule (12.5 mg total) by mouth daily.  [name] Kitchen lisinopril (ZESTRIL) 40 MG tablet TAKE 1 AND 1/2 TABLET BY MOUTH EVERY DAY  . metFORMIN (GLUCOPHAGE) 1000 MG tablet TAKE ONE TABLET BY MOUTH TWICE DAILY WITH FOOD  . propranolol (INDERAL) 10 MG tablet Take 1 tablet (10 mg total) by mouth 4 (four) times daily as needed (palpitations, irregular or fast heart rate).  . sildenafil (REVATIO) 20 MG tablet TAKE 1 TO 2 TABLETS 2 TO 3 HOURS BEFORE SEX   No facility-administered encounter medications on file as of 01/25/2021.   Reviewed chart for medication changes ahead of medication coordination call.  BP Readings from Last 3 Encounters:  08/07/20 130/62  07/30/20 136/78  05/25/20 126/74    Lab Results  Component Value Date   HGBA1C 6.4 (A) 07/30/2020   Patient obtains medications through Vials  90 Days  Last adherence delivery included:  [name] Kitchen Hydrochlorothiazide (MICROZIDE) 12.5 mg: one capsule daily . Lisinopril (ZESTRIL) 40 mg: take 1.5 tablets daily . ELIQUIS  5 mg: one tablet twice daily . Dulaglutide (TRULICITY) 1.5 FA/2.1HY SOPN: inject into skin once weekly  Patient declined the following  last month due to PRN use/additional supply on hand. . Diltiazem (CARDIZEM CD) 180 MG 24 hr: one capsule daily . Metformin (GLUCOPHAGE) 1000 mg: one tablet by mouth twice a day . Propranolol (INDERAL) 10 mg: Take 1 tablet (10 mg total) by mouth 4 (four) times daily as needed . Atorvastatin (LIPITOR) 40 mg: one tablet daily . Sildenafil (REVATIO) 20 MG tablet: take 1-2 tablets 2-3 hours before sex.  Patient is due for next adherence delivery on: 01/28/2021. Called patient and reviewed medications and coordinated delivery. This delivery to include: [name] Kitchen Dulaglutide (TRULICITY) 1.5 QM/5.7QI SOPN: inject into skin once weekly  I spoke with the patient and review medications. There are no changes in medications currently. Patient declined these medication this month due to PRN use/additional supply on hand.The patient is taking the following medications: . Diltiazem (CARDIZEM CD) 180 MG 24 hr: one capsule daily . Metformin (GLUCOPHAGE) 1000 mg: one tablet by mouth twice a day . Propranolol (INDERAL) 10 mg: Take 1 tablet (10 mg total) by mouth 4 (four) times daily as needed . Atorvastatin (LIPITOR) 40 mg: one tablet daily . Sildenafil (REVATIO) 20 MG tablet: take 1-2 tablets 2-3 hours before sex.   He currently does not not need any refills. Confirmed delivery date of 01/28/2021 advised patient that pharmacy will contact them the morning of delivery.  Star Rating  Drugs:  Dispensed Quantity Pharmacy  Atorvastatin 40 mg  01.11.2022 [facility]  Lisinopril 40 mg 03.08.2022 [facility]  Metformin HCL 1,000 mg 02.23.2022 [facility]    Amilia Revonda Standard, [place] [phone]

## 2021-01-28 ENCOUNTER — Ambulatory Visit: Payer: Medicare HMO | Admitting: Internal Medicine

## 2021-01-28 ENCOUNTER — Other Ambulatory Visit: Payer: Self-pay

## 2021-01-28 ENCOUNTER — Encounter: Payer: Self-pay | Admitting: Internal Medicine

## 2021-01-28 VITALS — BP 140/82 | HR 85 | Ht 75.0 in | Wt 210.0 lb

## 2021-01-28 DIAGNOSIS — E119 Type 2 diabetes mellitus without complications: Secondary | ICD-10-CM | POA: Diagnosis not present

## 2021-01-28 DIAGNOSIS — E785 Hyperlipidemia, unspecified: Secondary | ICD-10-CM | POA: Diagnosis not present

## 2021-01-28 DIAGNOSIS — E663 Overweight: Secondary | ICD-10-CM

## 2021-01-28 LAB — LIPID PANEL
Cholesterol: 99 mg/dL (ref 0–200)
HDL: 35.1 mg/dL — ABNORMAL LOW (ref >39.00)
LDL Cholesterol: 33 mg/dL (ref 0–99)
NonHDL: 63.64
Total CHOL/HDL Ratio: 3
Triglycerides: 152 mg/dL — ABNORMAL HIGH (ref 0.0–149.0)
VLDL: 30.4 mg/dL (ref 0.0–40.0)

## 2021-01-28 LAB — COMPREHENSIVE METABOLIC PANEL
ALT: 15 U/L (ref 0–53)
AST: 16 U/L (ref 0–37)
Albumin: 4.4 g/dL (ref 3.5–5.2)
Alkaline Phosphatase: 60 U/L (ref 39–117)
BUN: 9 mg/dL (ref 6–23)
CO2: 30 mEq/L (ref 19–32)
Calcium: 9.6 mg/dL (ref 8.4–10.5)
Chloride: 99 mEq/L (ref 96–112)
Creatinine, Ser: 0.85 mg/dL (ref 0.40–1.50)
GFR: 88.09 mL/min (ref >60.00)
Glucose, Bld: 134 mg/dL — ABNORMAL HIGH (ref 70–99)
Potassium: 4.6 mEq/L (ref 3.5–5.1)
Sodium: 134 mEq/L — ABNORMAL LOW (ref 135–145)
Total Bilirubin: 0.8 mg/dL (ref 0.2–1.2)
Total Protein: 6.9 g/dL (ref 6.0–8.3)

## 2021-01-28 LAB — POCT GLYCOSYLATED HEMOGLOBIN (HGB A1C): Hemoglobin A1C: 6.5 % — AB (ref 4.0–5.6)

## 2021-01-28 LAB — MICROALBUMIN / CREATININE URINE RATIO
Creatinine,U: 47.7 mg/dL
Microalb Creat Ratio: 8.4 mg/g (ref 0.0–30.0)
Microalb, Ur: 4 mg/dL — ABNORMAL HIGH (ref 0.0–1.9)

## 2021-01-28 MED ORDER — TRULICITY 1.5 MG/0.5ML ~~LOC~~ SOAJ: SUBCUTANEOUS | 11 refills | Status: DC

## 2021-01-28 NOTE — Progress Notes (Signed)
Patient ID: Robert [name], male   DOB: Jul 10, 1951, 70 y.o.   MRN: [id number]   This visit occurred during the SARS-CoV-2 public health emergency.  Safety protocols were in place, including screening questions prior to the visit, additional usage of staff PPE, and extensive cleaning of exam room while observing appropriate contact time as indicated for disinfecting solutions.   HPI: Robert [name] is a 70 y.o.-year-old male, returning for follow-up for  DM2, dx in 2000s, insulin-dependent since 2018, uncontrolled, with complications (PN, ED).  Last visit 6 months ago.  Interim history: He continues on a more plant-based diet.  Sugars have been fairly well controlled since last visit except for an episode of hypoglycemia (58) after taking 5 mg of glipizide. He has no complaints today.  Reviewed HbA1c levels: Lab Results  Component Value Date   HGBA1C 6.4 (A) 07/30/2020   HGBA1C 6.4 (A) 03/26/2020   HGBA1C 7.0 (A) 11/26/2019   Pt is on a regimen of: - Trulicity 1.5 mg weekly. - Metformin 1000 mg 2x a day >> 2000 mg with dinner - Glipizide 5 mg before a large dinner- added 12/2018 - only used it once >> low CBG (58) We stopped Novolog 70/30 10 units 2x a day in 11/2017, when starting Trulicity.  Pt checks his sugars 1-2 times a day: - am:  109-139, 142, 186 >> 103-145, 166 >> 100-140, 152, 161 (forgot night meds) - 2h after b'fast: 106-167, 217 >> 150 >> 130, 139 >> 134 >> n/c - before lunch: 107, 140 >> 106,138, 194 (PB) >> n/c - 2h after lunch:  93-161 >> 130-204, 224  >> n/c >> 150 >> n/c - before dinner: 96-125, 144, 199 (icecream) >> 110 >> n/c - 2h after dinner: 103-155, 187 >> 97-121 >> 145-157 >> 140-172, 190, 217 - bedtime: 83-187 >> 85-149 >> 82-128, 140 >> 106-140 >> 125-130 - nighttime:51/62, 80-126 >> 129 >> 91 (wine) >> 118 >> n/c Lowest sugar was 28 right after starting insulin >> ...82 >> 103 >> 58; he has hypoglycemia awareness in the 70s. Highest sugar  was 186 >> 166 >> 217.  Glucometer: ReliOn  Pt's meals are: - Breakfast: whole wheat toast + butter, 1x a wee noon - Lunch: tuna salad or tuna sandwich ([employer] delivery), soft taco, meat + baked beans or potatoes - Dinner: [employer] delivery - Snacks: 1-0: nuts, bananas, other fruit He decreased his alcohol intake. He was swimming 5 times a week before the coronavirus pandemic, now exercising at home.  -No CKD; last BUN/creatinine:  Lab Results  Component Value Date   BUN 15 02/06/2020   BUN 14 10/21/2019   CREATININE 0.84 02/06/2020   CREATININE 0.88 10/21/2019  On lisinopril 40. -+ HL; last set of lipids: Lab Results  Component Value Date   CHOL 101 02/06/2020   HDL 38.00 (L) 02/06/2020   LDLCALC 45 02/06/2020   LDLDIRECT 77.0 07/07/2015   TRIG 86.0 02/06/2020   CHOLHDL 3 02/06/2020  On Lipitor 40 and co-Q10. - last eye exam was in 10/2020: No DR, + cataract -he has numbness and tingling in his feet  He has a h/o arrhythmia (A fib) - wore an event monitor >> now on beta blocker. Cardiologist: Dr. Acie Fredrickson.  ROS: Constitutional: no weight gain/no weight loss, no fatigue, no subjective hyperthermia, no subjective hypothermia Eyes: no blurry vision, no xerophthalmia ENT: no sore throat, no nodules palpated in neck, no dysphagia, no odynophagia, no hoarseness Cardiovascular: no CP/no  SOB/no palpitations/no leg swelling Respiratory: no cough/no SOB/no wheezing Gastrointestinal: no N/no V/no D/no C/no acid reflux Musculoskeletal: no muscle aches/+ joint aches Skin: no rashes, no hair loss Neurological: no tremors/+ numbness/+ tingling/no dizziness  I reviewed pt's medications, allergies, PMH, social hx, family hx, and changes were documented in the history of present illness. Otherwise, unchanged from my initial visit note.  Past Medical History:  Diagnosis Date  . Diabetes mellitus type [name]   . HLD (hyperlipidemia)   . HTN (hypertension)   . Hx of colonic polyps    . Paroxysmal atrial fibrillation ([place]) 03/12/2019  . Skin cancer    history of   Past Surgical History:  Procedure Laterality Date  . COLONOSCOPY    . PATELLAR TENDON REPAIR     Left  . Removal of excess tissue     Right and Left upper eyelids   Social History   Socioeconomic History  . Marital status: Married    Spouse name: Not on file  . Number of children: 2  Social Needs  Occupational History  . Occupation: DVD's  . Occupation: Cabin crew: Self Employed, retired  Tobacco Use  . Smoking status: Former Smoker    Types: Cigars    Last attempt to quit: 02/22/2011  . Smokeless tobacco: Never Used  . Tobacco comment: 2 cigars monthly or less  Substance and Sexual Activity  . Alcohol use: Yes     0.0 oz    Vodka, 2-3 drinks 3x a week   Drugs: No   Current Outpatient Medications on File Prior to Visit  Medication Sig Dispense Refill  . atorvastatin (LIPITOR) 40 MG tablet TAKE ONE TABLET BY MOUTH ONCE DAILY 48 tablet 1  . Coenzyme Q10 (COQ10 PO) Take 1 capsule by mouth daily.     [name] Kitchen diltiazem (CARDIZEM CD) 180 MG 24 hr capsule Take 1 capsule (180 mg total) by mouth daily. (Patient not taking: Reported on 08/07/2020) 90 capsule 3  . Dulaglutide (TRULICITY) 1.5 NU/2.7OZ SOPN INJECT 1.5 MG INTO THE SKIN ONCE A WEEK. 4 pen 11  . ELIQUIS 5 MG TABS tablet TAKE ONE TABLET BY MOUTH TWICE DAILY 60 tablet 5  . glucose blood test strip 1 each by Other route daily. Use daily for glucose control - Dx 250.00 100 each 0  . hydrochlorothiazide (MICROZIDE) 12.5 MG capsule Take 1 capsule (12.5 mg total) by mouth daily. 90 capsule 1  . lisinopril (ZESTRIL) 40 MG tablet TAKE 1 AND 1/2 TABLET BY MOUTH EVERY DAY 135 tablet 1  . metFORMIN (GLUCOPHAGE) 1000 MG tablet TAKE ONE TABLET BY MOUTH TWICE DAILY WITH FOOD 180 tablet 1  . propranolol (INDERAL) 10 MG tablet Take 1 tablet (10 mg total) by mouth 4 (four) times daily as needed (palpitations, irregular or fast heart rate). 30  tablet 9  . sildenafil (REVATIO) 20 MG tablet TAKE 1 TO 2 TABLETS 2 TO 3 HOURS BEFORE SEX 30 tablet 5   No current facility-administered medications on file prior to visit.   No Known Allergies Family History  Problem Relation Age of Onset  . Aortic aneurysm Other        family history  . Cancer Other        Breast (1st degree relative)  . Pancreatic cancer Mother 44  . Colon cancer Neg Hx   . Rectal cancer Neg Hx   . Stomach cancer Neg Hx   . Esophageal cancer Neg Hx     PE:  BP 140/82   Pulse 85   Ht 6\' 3"  (1.905 m)   Wt 210 lb (95.3 kg)   SpO2 97%   BMI 26.25 kg/m  Wt Readings from Last 3 Encounters:  01/28/21 210 lb (95.3 kg)  08/07/20 209 lb (94.8 kg)  07/30/20 207 lb 9.6 oz (94.2 kg)   Constitutional: overweight, in NAD Eyes: PERRLA, EOMI, no exophthalmos ENT: moist mucous membranes, no thyromegaly, no cervical lymphadenopathy Cardiovascular: RRR, No MRG Respiratory: CTA B Gastrointestinal: abdomen soft, NT, ND, BS+ Musculoskeletal: no deformities, strength intact in all 4 Skin: moist, warm, no rashes Neurological: no tremor with outstretched hands, DTR normal in all 4  ASSESSMENT: 1. DM2, insulin-dependent, uncontrolled, with complications - PN - ED  2. HL  3.  Overweight  PLAN:  1. Patient with longstanding, previously uncontrolled, type 2 diabetes, on oral antidiabetic regimen with Metformin and also weekly GLP-1 receptor agonist.  He also has prn sulfonylurea.  Sugars improved after moving Metformin at night.  At last visit, per review of his sugar log, sugars are mostly at goal and an HbA1c was excellent, at 6.4%, stable.  We did discuss about using glipizide before a larger meal, but otherwise we did not change his regimen. -At this visit, he tells me that he did try to take glipizide once but this dropped his blood sugars to 58.  We discussed that glipizide needs to be taken before a meal and I advised him to only use 2.5 mg before a large dinner.   Otherwise, per review of his log, his sugars are mostly at goal with few hyperglycemic exceptions when he forgot his medicines.  We do not need to change his regimen for now. -I refilled his Trulicity.  The price varies for him and he prefers to get it on a monthly basis. - I suggested to:  Patient Instructions  Please continue: - Trulicity 1.5 mg weekly. - Metformin 2000 mg with dinner  Use: - Glipizide 2.5 mg before a large dinner  Please return in 4-6 months with your sugar log.   - we checked his HbA1c: 6.5% (only slightly higher) - advised to check sugars at different times of the day - 1x a day, rotating check times - advised for yearly eye exams >> he is UTD - return to clinic in 6 months  2.  HL -Reviewed latest lipid panel from a year ago: LDL at goal, triglycerides also at goal, HDL slightly low: Lab Results  Component Value Date   CHOL 101 02/06/2020   HDL 38.00 (L) 02/06/2020   LDLCALC 45 02/06/2020   LDLDIRECT 77.0 07/07/2015   TRIG 86.0 02/06/2020   CHOLHDL 3 02/06/2020  -He continues Lipitor 40 mg daily and co-Q10.  He does have some joint pains, unclear if from Lipitor. -He is due for another lipid panel - will check today (nonfasting - 1/2 clementine, black coffee)  3.  Overweight -He lost approximately 26 pounds immediately after starting the GLP-1 receptor agonist and after switching to a more plant-based diet -He gained 3 pounds before last visit and 1 lb since then -We will continue Trulicity which should also help with weight loss  Component     Latest Ref Rng & Units 01/28/2021  Sodium     135 - 145 mEq/L 134 (L)  Potassium     3.5 - 5.1 mEq/L 4.6  Chloride     96 - 112 mEq/L 99  CO2     19 - 32 mEq/L 30  Glucose     70 - 99 mg/dL 134 (H)  BUN     6 - 23 mg/dL 9  Creatinine     0.40 - 1.50 mg/dL 0.85  Total Bilirubin     0.2 - 1.2 mg/dL 0.8  Alkaline Phosphatase     39 - 117 U/L 60  AST     0 - 37 U/L 16  ALT     0 - 53 U/L 15  Total  Protein     6.0 - 8.3 g/dL 6.9  Albumin     3.5 - 5.2 g/dL 4.4  GFR     >60.00 mL/min 88.09  Calcium     8.4 - 10.5 mg/dL 9.6  Cholesterol     0 - 200 mg/dL 99  Triglycerides     0.0 - 149.0 mg/dL 152.0 (H)  HDL Cholesterol     >39.00 mg/dL 35.10 (L)  VLDL     0.0 - 40.0 mg/dL 30.4  LDL (calc)     0 - 99 mg/dL 33  Total CHOL/HDL Ratio      3  NonHDL      63.64  Microalb, Ur     0.0 - 1.9 mg/dL 4.0 (H)  Creatinine,U     mg/dL 47.7  MICROALB/CREAT RATIO     0.0 - 30.0 mg/g 8.4   Labs mostly at goal, but with a low HDL and slightly low sodium.  [name], MD PhD [facility]

## 2021-01-28 NOTE — Patient Instructions (Addendum)
Please continue: - Trulicity 1.5 mg weekly. - Metformin 2000 mg with dinner  Use: - Glipizide 2.5 mg before a large dinner  Please return in 4-6 months with your sugar log. g.

## 2021-03-03 ENCOUNTER — Telehealth: Payer: Self-pay | Admitting: Pharmacist

## 2021-03-03 NOTE — Chronic Care Management (AMB) (Signed)
I spoke with the patient about his upcoming appointment on 03/04/2021 @ 1:00 PM with the clinical pharmacist. He was asked to please have all medication on hand to review with the pharmacist. He confirmed the appointment.   Maia Breslow, [place] [phone]

## 2021-03-04 ENCOUNTER — Telehealth: Payer: Medicare HMO

## 2021-03-04 NOTE — Progress Notes (Deleted)
Chronic Care Management Pharmacy Note  03/04/2021 Name:  Robert [name] MRN:  [id number] DOB:  [birth year]  Subjective: Robert [name] is an 70 y.o. year old male who is a primary patient of Isaac Bliss, Rayford Halsted, MD.  The CCM team was consulted for assistance with disease management and care coordination needs.    Engaged with patient by telephone for follow up visit in response to provider referral for pharmacy case management and/or care coordination services.   Consent to Services:  The patient was given information about Chronic Care Management services, agreed to services, and gave verbal consent prior to initiation of services.  Please see initial visit note for detailed documentation.   Patient Care Team: Isaac Bliss, Rayford Halsted, MD as PCP - General (Internal Medicine) Nahser, [name], MD as PCP - Cardiology (Cardiology) Viona Gilmore, [facility] as Pharmacist (Pharmacist)  Recent office visits: 08/07/20 Robert Frohlich, MD: Patient presented for chronic conditions follow up. Influenza vaccine given in office. No medication changes made. Follow up in 6 months.  Recent consult visits: 01/28/21 [name], MD (endo): Patient presented for DM follow up. A1c 6.5%. Continue metformin and Trulicity with glipizide 2.5 mg only before a big meal.   11/20/20 Marygrace Drought (ophthalmology): Unable to access notes.  07/30/20 Layla Maw, MD: Patient presented for DM follow up. A1c remains at 6.4%. Recommended taking glipizide before the meal if he plans to eat out or plans a larger meal. Follow up in 6 months.  07/10/20 Patient sent a message to cardiology about his urine being brown with metoprolol. Prescribed diltiazem to replace metoprolol.   05/25/20 Grayland Jack, MD (cardiology): Patient presented for Afib follow up. Patient reported palpitations and thought it might be due to increased stress. Prescribed metoprolol succinate 25 mg 1 tablet daily and  continue to use propranolol PRN. Follow up in 1 year.  Hospital visits: None in previous 6 months  Objective:  Lab Results  Component Value Date   CREATININE 0.85 01/28/2021   BUN 9 01/28/2021   GFR 88.09 01/28/2021   GFRNONAA 88 10/21/2019   GFRAA 101 10/21/2019   NA 134 (L) 01/28/2021   K 4.6 01/28/2021   CALCIUM 9.6 01/28/2021   CO2 30 01/28/2021   GLUCOSE 134 (H) 01/28/2021    Lab Results  Component Value Date/Time   HGBA1C 6.5 (A) 01/28/2021 10:05 AM   HGBA1C 6.4 (A) 07/30/2020 10:24 AM   HGBA1C 7.6 (H) 10/04/2017 12:06 PM   HGBA1C 7.7 (H) 07/05/2017 08:34 AM   GFR 88.09 01/28/2021 09:25 AM   GFR 90.48 02/06/2020 09:56 AM   MICROALBUR 4.0 (H) 01/28/2021 09:25 AM   MICROALBUR 0.9 07/23/2018 12:20 PM    Last diabetic Eye exam:  Lab Results  Component Value Date/Time   HMDIABEYEEXA No Retinopathy 11/20/2020 12:00 AM    Last diabetic Foot exam:  Lab Results  Component Value Date/Time   HMDIABFOOTEX Done 03/31/2010 12:00 AM     Lab Results  Component Value Date   CHOL 99 01/28/2021   HDL 35.10 (L) 01/28/2021   LDLCALC 33 01/28/2021   LDLDIRECT 77.0 07/07/2015   TRIG 152.0 (H) 01/28/2021   CHOLHDL 3 01/28/2021    Hepatic Function Latest Ref Rng & Units 01/28/2021 02/06/2020 07/23/2018  Total Protein 6.0 - 8.3 g/dL 6.9 6.6 6.8  Albumin 3.5 - 5.2 g/dL 4.4 4.4 4.4  AST 0 - 37 U/L _0 ALT 0 - 53 U/L 15 12 12  Alk Phosphatase 39 - 117 U/L 60 68 67  Total Bilirubin 0.2 - 1.2 mg/dL 0.8 0.7 0.5  Bilirubin, Direct 0.0 - 0.3 mg/dL - - 0.1    Lab Results  Component Value Date/Time   TSH 1.08 02/06/2020 09:56 AM   TSH 1.25 07/23/2018 12:20 PM    CBC Latest Ref Rng & Units 02/06/2020 07/23/2018 03/01/2017  WBC 4.0 - 10.5 K/uL 10.7(H) 12.9(H) 9.6  Hemoglobin 13.0 - 17.0 g/dL 16.6 15.4 15.8  Hematocrit 39.0 - 52.0 % 47.8 45.2 46.1  Platelets 150.0 - 400.0 K/uL 194.0 212.0 207.0    Lab Results  Component Value Date/Time   VD25OH 35.73 02/06/2020 09:56 AM     Clinical ASCVD: {YES/NO:21197} The ASCVD Risk score Mikey Bussing DC Jr., et al., 2013) failed to calculate for the following reasons:   The valid total cholesterol range is 130 to 320 mg/dL    Depression screen [facility] 2/9 02/06/2020 07/23/2018 03/01/2017  Decreased Interest 2 0 0  Down, Depressed, Hopeless 2 0 0  PHQ - 2 Score 4 0 0  Altered sleeping 0 - -  Tired, decreased energy 1 - -  Change in appetite 0 - -  Feeling bad or failure about yourself  0 - -  Trouble concentrating 0 - -  Moving slowly or fidgety/restless 0 - -  Suicidal thoughts 0 - -  PHQ-9 Score 5 - -  Difficult doing work/chores Not difficult at all - -     ***Other: (CHADS2VASc if Afib, MMRC or CAT for COPD, ACT, DEXA)  Social History   Tobacco Use  Smoking Status Former Smoker  . Types: Cigars  . Quit date: 02/22/2011  . Years since quitting: 10.0  Smokeless Tobacco Never Used  Tobacco Comment   2 cigars monthly or less   BP Readings from Last 3 Encounters:  01/28/21 140/82  08/07/20 130/62  07/30/20 136/78   Pulse Readings from Last 3 Encounters:  01/28/21 85  08/07/20 77  07/30/20 75   Wt Readings from Last 3 Encounters:  01/28/21 210 lb (95.3 kg)  08/07/20 209 lb (94.8 kg)  07/30/20 207 lb 9.6 oz (94.2 kg)   BMI Readings from Last 3 Encounters:  01/28/21 26.25 kg/m  08/07/20 26.83 kg/m  07/30/20 26.65 kg/m    Assessment/Interventions: Review of patient past medical history, allergies, medications, health status, including review of consultants reports, laboratory and other test data, was performed as part of comprehensive evaluation and provision of chronic care management services.   SDOH:  (Social Determinants of Health) assessments and interventions performed: {yes/no:20286}  SDOH Screenings   Alcohol Screen: Not on file  Depression (VWU9-8): Not on file  Financial Resource Strain: Low Risk   . Difficulty of Paying Living Expenses: Not hard at all  Food Insecurity: Not on file   Housing: Not on file  Physical Activity: Sufficiently Active  . Days of Exercise per Week: 5 days  . Minutes of Exercise per Session: 60 min  Social Connections: Not on file  Stress: Not on file  Tobacco Use: Medium Risk  . Smoking Tobacco Use: Former Smoker  . Smokeless Tobacco Use: Never Used  Transportation Needs: No Transportation Needs  . Lack of Transportation (Medical): No  . Lack of Transportation (Non-Medical): No    CCM Care Plan  No Known Allergies  Medications Reviewed Today    Reviewed by [name], MD (Physician) on 01/28/21 at [place] List Status: <None>  Medication Order Taking? Sig Documenting Provider Last Dose  Status Informant  atorvastatin (LIPITOR) 40 MG tablet [id number] Yes TAKE ONE TABLET BY MOUTH ONCE DAILY Isaac Bliss, Rayford Halsted, MD Taking Active   Coenzyme Q10 (COQ10 PO) [id number] Yes Take 1 capsule by mouth daily.  [provider] Taking Active   Dulaglutide (TRULICITY) 1.5 YT/0.1SW SOPN [id number] Yes INJECT 1.5 MG INTO THE SKIN ONCE A WEEK. [name], MD Taking Active   ELIQUIS 5 MG TABS tablet [id number] Yes TAKE ONE TABLET BY MOUTH TWICE DAILY Nahser, [name], MD Taking Active   glucose blood test strip [id number] Yes 1 each by Other route daily. Use daily for glucose control - Dx 250.00 Isaac Bliss, Rayford Halsted, MD Taking Active   hydrochlorothiazide (MICROZIDE) 12.5 MG capsule [id number] Yes Take 1 capsule (12.5 mg total) by mouth daily. Isaac Bliss, Rayford Halsted, MD Taking Active   lisinopril (ZESTRIL) 40 MG tablet [id number] Yes TAKE 1 AND 1/2 TABLET BY MOUTH EVERY DAY Isaac Bliss, Rayford Halsted, MD Taking Active   metFORMIN (GLUCOPHAGE) 1000 MG tablet [id number] Yes TAKE ONE TABLET BY MOUTH TWICE DAILY WITH FOOD Isaac Bliss, Rayford Halsted, MD Taking Active   propranolol (INDERAL) 10 MG tablet [id number] Yes Take 1 tablet (10 mg total) by mouth 4 (four) times daily as needed (palpitations, irregular or fast heart rate).  Nahser, [name], MD Taking Active   sildenafil (REVATIO) 20 MG tablet [id number] Yes TAKE 1 TO 2 TABLETS 2 TO 3 HOURS BEFORE SEX Isaac Bliss, Rayford Halsted, MD Taking Active           Patient Active Problem List   Diagnosis Date Noted  . Long term current use of anticoagulant -Eliquis 03/12/2019  . Paroxysmal atrial fibrillation ([place]) 03/12/2019  . Overweight (BMI 25.0-29.9) 01/03/2019  . Type 2 diabetes mellitus without complication, without long-term current use of insulin ([place]) 07/23/2018  . BPH associated with nocturia 03/01/2017  . Snoring 03/12/2013  . Irregular heart rate 06/23/2011  . Hx of colonic polyps   . ERECTILE DYSFUNCTION 12/19/2008  . SKIN CANCER, HX OF 12/19/2008  . Hyperlipidemia 08/21/2007  . Essential hypertension 08/21/2007    Immunization History  Administered Date(s) Administered  . Fluad Quad(high Dose 65+) 07/02/2019, 08/07/2020  . Influenza Split 09/26/2011, 11/26/2012  . Influenza Whole 08/21/2007  . Influenza, High Dose Seasonal PF 07/10/2017, 07/23/2018  . Influenza,inj,Quad PF,6+ Mos 08/06/2013, 07/28/2015  . PFIZER(Purple Top)SARS-COV-2 Vaccination 01/24/2020, 02/17/2020  . Pneumococcal Conjugate-13 03/01/2017  . Pneumococcal Polysaccharide-23 07/23/2018  . Td 08/21/2007  . Zoster 02/05/2013    Conditions to be addressed/monitored:  Hypertension, Hyperlipidemia, Diabetes, Atrial Fibrillation and ED  There are no care plans that you recently modified to display for this patient.   Current Barriers:  . {pharmacybarriers:24917}  Pharmacist Clinical Goal(s):  [name] Kitchen Patient will {PHARMACYGOALCHOICES:24921} through collaboration with PharmD and provider.   Interventions: . 1:1 collaboration with Isaac Bliss, Rayford Halsted, MD regarding development and update of comprehensive plan of care as evidenced by provider attestation and co-signature . Inter-disciplinary care team collaboration (see longitudinal plan of care) . Comprehensive medication  review performed; medication list updated in electronic medical record  BP Readings from Last 3 Encounters:  01/28/21 140/82  08/07/20 130/62  07/30/20 136/78    Hypertension (BP goal <140/90) -Controlled -Current treatment:  HCTZ 12.5 mg 1 capsule daily   Lisinopril 40 mg 1 tablet daily  Diltiazem 180 mg 1 tablet daily (has not started)  Propranolol 10 mg 1 tablet 4 times daily as needed  -Medications previously tried: none -Current home  readings: *** (prev: once weekly 110/68, 120/70s) -Current dietary habits: *** - patient doesn't cook with salt and very rarely uses salt; he does admit to occasionally having frozen veggies but has fresh veggies more often -Current exercise habits: patient goes swimming every Mon, Wed and Fri and has done so for the past 8 weeks; he also doesn't sit down for very long at home and mows the yard -{ACTIONS;DENIES/REPORTS:21021675::"Denies"} hypotensive/hypertensive symptoms -Educated on {CCM BP Counseling:25124} -Counseled to monitor BP at home ***, document, and provide log at future appointments -{CCMPHARMDINTERVENTION:25122}  Lab Results  Component Value Date   CHOL 99 01/28/2021   HDL 35.10 (L) 01/28/2021   LDLCALC 33 01/28/2021   LDLDIRECT 77.0 07/07/2015   TRIG 152.0 (H) 01/28/2021   CHOLHDL 3 01/28/2021     Hyperlipidemia: (LDL goal < 70) -Uncontrolled -Current treatment:  Atorvastatin 40 mg 1 tablet once daily at bedtime -Medications previously tried: rosuvastatin  -Current dietary patterns: *** -Current exercise habits: *** -Educated on {CCM HLD Counseling:25126} -{CCMPHARMDINTERVENTION:25122}  Diabetes (A1c goal <7%) -{US controlled/uncontrolled:25276} -Current medications:  Metformin 1000 mg 1 tablet twice a day with a meal (a couple of times a month)  Trulicity 1.5 QQ/2.2 mL inject 1.5 mg into the skin once a week  Glipizide 2.5 mg 1 tablet prior to a large dinner -Medications previously tried: insulin (switched  to Trulicity) -Current home glucose readings . fasting glucose: *** . post prandial glucose: *** -{ACTIONS;DENIES/REPORTS:21021675::"Denies"} hypoglycemic/hyperglycemic symptoms -Current meal patterns:  . breakfast: ***  . lunch: ***  . dinner: *** . snacks: *** . drinks: *** -Current exercise: *** -Educated on {CCM DM COUNSELING:25123} -Counseled to check feet daily and get yearly eye exams -{CCMPHARMDINTERVENTION:25122}  Diet: patient stinks to plant based diet pretty well and doesn't eat much meat; he limits his intake of fruit as he knows that will increase blood sugars -Recommended swapping out regular pasta for lower carb alternatives such as Banza  Atrial Fibrillation (Goal: prevent stroke and major bleeding) -{US controlled/uncontrolled:25276} -CHADSVASC: *** -Current treatment:  Rate control: Propranolol 10 mg 1 tablet by mouth 4 times daily as needed; taking diltiazem?!  Anticoagulation: Eliquis 5 mg 1 tablet twice daily -Medications previously tried: *** -Home BP and HR readings: ***  -Counseled on {CCMAFIBCOUNSELING:25120} -{CCMPHARMDINTERVENTION:25122}  ED (Goal: ***) -{US controlled/uncontrolled:25276} -Current treatment   Sildenafil 20 mg 1-2 tablets 2-3 hrs before sex -Medications previously tried: ***  -{CCMPHARMDINTERVENTION:25122}  Vaccines   Reviewed and discussed patient's vaccination history.    Immunization History  Administered Date(s) Administered  . Fluad Quad(high Dose 65+) 07/02/2019, 08/07/2020  . Influenza Split 09/26/2011, 11/26/2012  . Influenza Whole 08/21/2007  . Influenza, High Dose Seasonal PF 07/10/2017, 07/23/2018  . Influenza,inj,Quad PF,6+ Mos 08/06/2013, 07/28/2015  . PFIZER(Purple Top)SARS-COV-2 Vaccination 01/24/2020, 02/17/2020  . Pneumococcal Conjugate-13 03/01/2017  . Pneumococcal Polysaccharide-23 07/23/2018  . Td 08/21/2007  . Zoster 02/05/2013    Plan  Recommended patient receive *** vaccine in *** office.    Health Maintenance -Vaccine gaps: shingrix -Current therapy:   Coenzyme Q10 1 tablet daily  Ginkgo biloba 1 tablet daily  Multivitamin 1 tablet daily -Educated on {ccm supplement counseling:25128} -{CCM Patient satisfied:25129} -{CCMPHARMDINTERVENTION:25122}   Patient Goals/Self-Care Activities . Patient will:  - {pharmacypatientgoals:24919}  Follow Up Plan: {CM FOLLOW UP LNLG:92119}   Medication Assistance: {MEDASSISTANCEINFO:25044}  Patient's preferred pharmacy is:  [facility] - [place], [place] - [address] [place] [address] Phone: [phone] Fax: [phone]  [id number]- [place],  [place] - [place] [address] Phone: [phone] Fax: [phone]  Uses pill box? {Yes or If no, why not?:20788} Pt endorses ***% compliance  We discussed: {Pharmacy options:24294} Patient decided to: {US Pharmacy OMQT:92763}  Care Plan and Follow Up Patient Decision:  {FOLLOWUP:24991}  Plan: {CM FOLLOW UP REVQ:00379}  Jeni Salles, PharmD [place] Pharmacist [place] at [place]

## 2021-03-17 DIAGNOSIS — L814 Other melanin hyperpigmentation: Secondary | ICD-10-CM | POA: Diagnosis not present

## 2021-03-17 DIAGNOSIS — L570 Actinic keratosis: Secondary | ICD-10-CM | POA: Diagnosis not present

## 2021-03-17 DIAGNOSIS — L821 Other seborrheic keratosis: Secondary | ICD-10-CM | POA: Diagnosis not present

## 2021-03-17 DIAGNOSIS — C44519 Basal cell carcinoma of skin of other part of trunk: Secondary | ICD-10-CM | POA: Diagnosis not present

## 2021-03-17 DIAGNOSIS — Z85828 Personal history of other malignant neoplasm of skin: Secondary | ICD-10-CM | POA: Diagnosis not present

## 2021-03-19 ENCOUNTER — Other Ambulatory Visit: Payer: Self-pay | Admitting: Internal Medicine

## 2021-03-19 DIAGNOSIS — I10 Essential (primary) hypertension: Secondary | ICD-10-CM

## 2021-03-23 ENCOUNTER — Telehealth: Payer: Self-pay | Admitting: Pharmacist

## 2021-03-23 NOTE — Chronic Care Management (AMB) (Signed)
Chronic Care Management Pharmacy Assistant   Name: Robert [name]  MRN: [id number] DOB: 10-05-1951  Reason for Encounter: Medication Review/ Medication Coordination Call.   Recent office visits:  None.   Recent consult visits: None.   Hospital visits:  None in previous 6 months  Medications: Outpatient Encounter Medications as of 03/23/2021  Medication Sig  . atorvastatin (LIPITOR) 40 MG tablet TAKE ONE TABLET BY MOUTH ONCE DAILY  . Coenzyme Q10 (COQ10 PO) Take 1 capsule by mouth daily.   . Dulaglutide (TRULICITY) 1.5 WU/9.8JX SOPN INJECT 1.5 MG INTO THE SKIN ONCE A WEEK.  [name] Kitchen ELIQUIS 5 MG TABS tablet TAKE ONE TABLET BY MOUTH TWICE DAILY  . glucose blood test strip 1 each by Other route daily. Use daily for glucose control - Dx 250.00  . hydrochlorothiazide (MICROZIDE) 12.5 MG capsule Take 1 capsule (12.5 mg total) by mouth daily.  [name] Kitchen lisinopril (ZESTRIL) 40 MG tablet TAKE 1 AND 1/2 TABLETS BY MOUTH ONCE DAILY  . metFORMIN (GLUCOPHAGE) 1000 MG tablet TAKE ONE TABLET BY MOUTH TWICE DAILY WITH FOOD  . propranolol (INDERAL) 10 MG tablet Take 1 tablet (10 mg total) by mouth 4 (four) times daily as needed (palpitations, irregular or fast heart rate).  . sildenafil (REVATIO) 20 MG tablet TAKE 1 TO 2 TABLETS 2 TO 3 HOURS BEFORE SEX   No facility-administered encounter medications on file as of 03/23/2021.    Reviewed chart for medication changes ahead of medication coordination call.  No OVs, Consults, or hospital visits since last care coordination call/Pharmacist visit. (If appropriate, list visit date, provider name)  No medication changes indicated OR if recent visit, treatment plan here.  BP Readings from Last 3 Encounters:  01/28/21 140/82  08/07/20 130/62  07/30/20 136/78    Lab Results  Component Value Date   HGBA1C 6.5 (A) 01/28/2021     Patient obtains medications through Vials  90 Days   Last adherence delivery included:  Dulaglutide (TRULICITY) 1.5  BJ/4.7WG SOPN: inject into skin once weekly  Patient declined (meds) last month due to PRN use/additional supply on hand.  Diltiazem (CARDIZEM CD) 180 MG 24 hr: one capsule daily  Metformin (GLUCOPHAGE) 1000 mg: one tablet by mouth twice a day  Propranolol (INDERAL) 10 mg: Take 1 tablet (10 mg total) by mouth 4 (four) times daily as needed  Atorvastatin (LIPITOR) 40 mg: one tablet daily  Sildenafil (REVATIO) 20 MG tablet: take 1-2 tablets 2-3 hours before sex.   Patient is due for next adherence delivery on: 03/27/21. Called patient and reviewed medications and coordinated delivery.  This delivery to include:  Diltiazem (CARDIZEM CD) 180 MG 24 hr: one capsule daily  Metformin (GLUCOPHAGE) 1000 mg: one tablet by mouth twice a day  Propranolol (INDERAL) 10 mg: Take 1 tablet (10 mg total) by mouth 4 (four) times daily as needed  Atorvastatin (LIPITOR) 40 mg: one tablet daily  Sildenafil (REVATIO) 20 MG tablet: take 1-2 tablets 2-3 hours before sex.  Dulaglutide (TRULICITY) 1.5 NF/6.2ZH SOPN: inject into skin once weekly  Patient declined the following medications (meds) due to (reason)   Per [name] at [facility] sync form has been done and she spoke with patient and Confirmed delivery date of 03/27/21 [name] states to not worry about calling patient due to patient getting confused.   Star Rating Drugs:   trulicity 1.5mg  - last filled on 01/26/21 30 DS at [facility]  Metformin 1000mg  - last filled on 12/16/20 90DS at [facility]  Atorvastatin 40mg  - last filled on 11/03/20 90DS at [facility] Pharmacist Assistant [phone]

## 2021-04-02 ENCOUNTER — Encounter: Payer: Medicare HMO | Admitting: Internal Medicine

## 2021-04-08 ENCOUNTER — Telehealth: Payer: Self-pay | Admitting: Pharmacist

## 2021-04-08 NOTE — Chronic Care Management (AMB) (Signed)
    Chronic Care Management Pharmacy Assistant   Name: Robert [name]  MRN: [id number] DOB: Oct 26, 1950  Reason for Encounter: Medication Review/ No fill Report for Hydrochlorothiazide.    Recent office visits:  None  Recent consult visits:  None  Hospital visits:  None in previous 6 months  Medications: Outpatient Encounter Medications as of 04/08/2021  Medication Sig   atorvastatin (LIPITOR) 40 MG tablet TAKE ONE TABLET BY MOUTH ONCE DAILY   Coenzyme Q10 (COQ10 PO) Take 1 capsule by mouth daily.    Dulaglutide (TRULICITY) 1.5 LH/7.3SK SOPN INJECT 1.5 MG INTO THE SKIN ONCE A WEEK.   ELIQUIS 5 MG TABS tablet TAKE ONE TABLET BY MOUTH TWICE DAILY   glucose blood test strip 1 each by Other route daily. Use daily for glucose control - Dx 250.00   hydrochlorothiazide (MICROZIDE) 12.5 MG capsule Take 1 capsule (12.5 mg total) by mouth daily.   lisinopril (ZESTRIL) 40 MG tablet TAKE 1 AND 1/2 TABLETS BY MOUTH ONCE DAILY   metFORMIN (GLUCOPHAGE) 1000 MG tablet TAKE ONE TABLET BY MOUTH TWICE DAILY WITH FOOD   propranolol (INDERAL) 10 MG tablet Take 1 tablet (10 mg total) by mouth 4 (four) times daily as needed (palpitations, irregular or fast heart rate).   sildenafil (REVATIO) 20 MG tablet TAKE 1 TO 2 TABLETS 2 TO 3 HOURS BEFORE SEX   No facility-administered encounter medications on file as of 04/08/2021.   No fill Report for Hydrochlorothiazide Notes:  Multiple unsuccessful attempts to reach patient to determine reason for no fill on Hydrochlorothiazide.   Star Rating Drugs:  Hydrochlorothiazide 12.5- last filled on 12/29/20 90DS at [facility] Metformin 1000mg  - last filled on 03/27/21 90DS at [facility] Lisinopril 40mg  - last filled on 12/29/20 90DS at [facility] Dulaglutide 1.5mg  - last filled on 03/27/21 30DS at [facility] Atorvastatin 40mg  - last filled on 03/27/21 90DS at [facility] [phone]

## 2021-04-09 NOTE — Telephone Encounter (Cosign Needed)
2nd attempt

## 2021-04-13 NOTE — Telephone Encounter (Cosign Needed)
Patient called back and did not answer when I returned call back.  Will try again.

## 2021-04-13 NOTE — Telephone Encounter (Cosign Needed)
3rd attempt

## 2021-04-14 ENCOUNTER — Other Ambulatory Visit: Payer: Self-pay | Admitting: Internal Medicine

## 2021-04-14 DIAGNOSIS — E785 Hyperlipidemia, unspecified: Secondary | ICD-10-CM

## 2021-04-14 DIAGNOSIS — E119 Type 2 diabetes mellitus without complications: Secondary | ICD-10-CM

## 2021-04-18 ENCOUNTER — Encounter: Payer: Self-pay | Admitting: Internal Medicine

## 2021-04-18 DIAGNOSIS — N390 Urinary tract infection, site not specified: Secondary | ICD-10-CM | POA: Diagnosis not present

## 2021-04-18 DIAGNOSIS — R319 Hematuria, unspecified: Secondary | ICD-10-CM | POA: Diagnosis not present

## 2021-04-19 ENCOUNTER — Telehealth: Payer: Self-pay | Admitting: Pharmacist

## 2021-04-19 ENCOUNTER — Encounter: Payer: Self-pay | Admitting: Internal Medicine

## 2021-04-19 DIAGNOSIS — C44519 Basal cell carcinoma of skin of other part of trunk: Secondary | ICD-10-CM | POA: Diagnosis not present

## 2021-04-19 NOTE — Chronic Care Management (AMB) (Signed)
Chronic Care Management Pharmacy Assistant   Name: Robert [name]  MRN: [id number] DOB: [birth year]  Reason for Encounter: Medication Review   Recent office visits:  None  Recent consult visits:  None  Hospital visits: Urgent Care Medication Reconciliation was completed by comparing discharge summary, patient's EMR and Pharmacy list, and upon discussion with patient.  Patient was  seen at [facility] on 06.26.2022 due to Hematuria.   New?Medications Started at Urgent care Discharge:?? -started  Ceftriaxone (ROCEPHIN) injection 500 mg; 500 mg, Intramuscular, Once, Indications: Urinary Tract Infection- in office Cephalexin (KEFLEX) 500 mg capsule; Take one capsule (500 mg dose) by mouth 2 (two) times daily for 7 days.  Medication Changes at Urgent Care  Discharge: -Changed None  Medications Discontinued at Urgent Care Discharge: -Stopped None  Medications that remain the same after Urgent Care Visit:??  -All other medications will remain the same.    Medications: Outpatient Encounter Medications as of 04/19/2021  Medication Sig   atorvastatin (LIPITOR) 40 MG tablet TAKE ONE TABLET BY MOUTH ONCE DAILY   Coenzyme Q10 (COQ10 PO) Take 1 capsule by mouth daily.    Dulaglutide (TRULICITY) 1.5 WU/9.8JX SOPN INJECT 1.5 MG INTO THE SKIN ONCE A WEEK.   ELIQUIS 5 MG TABS tablet TAKE ONE TABLET BY MOUTH TWICE DAILY   glucose blood test strip 1 each by Other route daily. Use daily for glucose control - Dx 250.00   hydrochlorothiazide (MICROZIDE) 12.5 MG capsule Take 1 capsule (12.5 mg total) by mouth daily.   lisinopril (ZESTRIL) 40 MG tablet TAKE 1 AND 1/2 TABLETS BY MOUTH ONCE DAILY   metFORMIN (GLUCOPHAGE) 1000 MG tablet TAKE ONE TABLET BY MOUTH TWICE DAILY WITH FOOD   propranolol (INDERAL) 10 MG tablet Take 1 tablet (10 mg total) by mouth 4 (four) times daily as needed (palpitations, irregular or fast heart rate).   sildenafil (REVATIO) 20 MG tablet TAKE 1 TO  2 TABLETS 2 TO 3 HOURS BEFORE SEX   No facility-administered encounter medications on file as of 04/19/2021.   Reviewed chart for medication changes ahead of medication coordination call.  BP Readings from Last 3 Encounters:  01/28/21 140/82  08/07/20 130/62  07/30/20 136/78    Lab Results  Component Value Date   HGBA1C 6.5 (A) 01/28/2021    Patient obtains medications through Vials  90 Days   Last adherence delivery included:  Metoprolol 25 mg ER: one tablet daily Eliquis 5 mg: one tablet daily Trulicity 9.1/4.7 ML: inject 1.5 ml into the skin once a week Sildenafil (REVATIO) 20 MG tablet: one as needed  Patient declined the following medication last month due to PRN use/additional supply on hand. Lisinopril 40 mg: one tablet daily- abundance on hand hydrochlorothiazide (MICROZIDE) 12.5 MG capsule: one tablet daily- abundance on hand  Patient is due for next adherence delivery on: 07.07.2022. Called patient and reviewed medications and coordinated delivery. This delivery to include: Metoprolol 25 mg ER: one tablet daily Eliquis 5 mg: one tablet daily Lisinopril 40 mg: one tablet daily- abundance on hand Hydrochlorothiazide (MICROZIDE) 12.5 MG capsule: one tablet daily- abundance on hand Metformin HCL 1,000 mg: one tablet twice daily Atorvastatin 40 mg: one tablet daily  Coordinated acute fill for Trulicity 8.2/9.5 ml to be delivered 07.01.2022.  Patient declined the following medications  due to PRN use. Sildenafil (REVATIO) 20 MG tablet: one as needed  Patient currently does not need refills.  Confirmed delivery date of 07.07.2022, advised patient that pharmacy will  contact them the morning of delivery.   Star Rating Drugs: Medication Dispensed  Quantity Pharmacy  Atorvastatin 40 mg 01.11.2022 90 [facility]  Metformin HCL 1000 mg 02.23.2022 180 [facility]  Lisinopril 40 mg 03.08.2022 135 [facility]    Amilia Revonda Standard, [place] Pharmacist  Assistant [phone]

## 2021-04-20 ENCOUNTER — Ambulatory Visit (INDEPENDENT_AMBULATORY_CARE_PROVIDER_SITE_OTHER): Payer: Medicare HMO | Admitting: Internal Medicine

## 2021-04-20 ENCOUNTER — Other Ambulatory Visit: Payer: Self-pay

## 2021-04-20 ENCOUNTER — Encounter: Payer: Self-pay | Admitting: Internal Medicine

## 2021-04-20 VITALS — BP 116/70 | HR 69 | Temp 97.6°F | Ht 75.0 in | Wt 212.1 lb

## 2021-04-20 DIAGNOSIS — I480 Paroxysmal atrial fibrillation: Secondary | ICD-10-CM | POA: Diagnosis not present

## 2021-04-20 DIAGNOSIS — R310 Gross hematuria: Secondary | ICD-10-CM | POA: Diagnosis not present

## 2021-04-20 DIAGNOSIS — E785 Hyperlipidemia, unspecified: Secondary | ICD-10-CM

## 2021-04-20 DIAGNOSIS — E119 Type 2 diabetes mellitus without complications: Secondary | ICD-10-CM | POA: Diagnosis not present

## 2021-04-20 DIAGNOSIS — Z0000 Encounter for general adult medical examination without abnormal findings: Secondary | ICD-10-CM

## 2021-04-20 DIAGNOSIS — I10 Essential (primary) hypertension: Secondary | ICD-10-CM | POA: Diagnosis not present

## 2021-04-20 DIAGNOSIS — Z7901 Long term (current) use of anticoagulants: Secondary | ICD-10-CM | POA: Diagnosis not present

## 2021-04-20 LAB — COMPREHENSIVE METABOLIC PANEL
ALT: 15 U/L (ref 0–53)
AST: 14 U/L (ref 0–37)
Albumin: 4.4 g/dL (ref 3.5–5.2)
Alkaline Phosphatase: 72 U/L (ref 39–117)
BUN: 10 mg/dL (ref 6–23)
CO2: 28 mEq/L (ref 19–32)
Calcium: 9.4 mg/dL (ref 8.4–10.5)
Chloride: 98 mEq/L (ref 96–112)
Creatinine, Ser: 0.77 mg/dL (ref 0.40–1.50)
GFR: 90.61 mL/min (ref >60.00)
Glucose, Bld: 116 mg/dL — ABNORMAL HIGH (ref 70–99)
Potassium: 4 mEq/L (ref 3.5–5.1)
Sodium: 134 mEq/L — ABNORMAL LOW (ref 135–145)
Total Bilirubin: 0.6 mg/dL (ref 0.2–1.2)
Total Protein: 6.6 g/dL (ref 6.0–8.3)

## 2021-04-20 LAB — LIPID PANEL
Cholesterol: 100 mg/dL (ref 0–200)
HDL: 41.4 mg/dL (ref >39.00)
LDL Cholesterol: 35 mg/dL (ref 0–99)
NonHDL: 58.44
Total CHOL/HDL Ratio: 2
Triglycerides: 119 mg/dL (ref 0.0–149.0)
VLDL: 23.8 mg/dL (ref 0.0–40.0)

## 2021-04-20 LAB — CBC WITH DIFFERENTIAL/PLATELET
Basophils Absolute: 0 10*3/uL (ref 0.0–0.1)
Basophils Relative: 0.5 % (ref 0.0–3.0)
Eosinophils Absolute: 0 10*3/uL (ref 0.0–0.7)
Eosinophils Relative: 0.4 % (ref 0.0–5.0)
HCT: 46 % (ref 39.0–52.0)
Hemoglobin: 15.9 g/dL (ref 13.0–17.0)
Lymphocytes Relative: 27 % (ref 12.0–46.0)
Lymphs Abs: 2.5 10*3/uL (ref 0.7–4.0)
MCHC: 34.5 g/dL (ref 30.0–36.0)
MCV: 93.1 fl (ref 78.0–100.0)
Monocytes Absolute: 0.7 10*3/uL (ref 0.1–1.0)
Monocytes Relative: 7.8 % (ref 3.0–12.0)
Neutro Abs: 6 10*3/uL (ref 1.4–7.7)
Neutrophils Relative %: 64.3 % (ref 43.0–77.0)
Platelets: 177 10*3/uL (ref 150.0–400.0)
RBC: 4.93 Mil/uL (ref 4.22–5.81)
RDW: 13.5 % (ref 11.5–15.5)
WBC: 9.4 10*3/uL (ref 4.0–10.5)

## 2021-04-20 LAB — VITAMIN D 25 HYDROXY (VIT D DEFICIENCY, FRACTURES): VITD: 37.23 ng/mL (ref 30.00–100.00)

## 2021-04-20 LAB — VITAMIN B12: Vitamin B-12: 246 pg/mL (ref 211–911)

## 2021-04-20 LAB — TSH: TSH: 1.18 u[IU]/mL (ref 0.35–4.50)

## 2021-04-20 NOTE — Patient Instructions (Signed)
-  Nice seeing you today!!  -Lab work today; will notify you once results are available.  -Pending vaccines: 2nd COVID booster, tdap (tetanus), shingles.  -Schedule follow up in 6 months.

## 2021-04-20 NOTE — Progress Notes (Signed)
Established Patient Office Visit     This visit occurred during the SARS-CoV-2 public health emergency.  Safety protocols were in place, including screening questions prior to the visit, additional usage of staff PPE, and extensive cleaning of exam room while observing appropriate contact time as indicated for disinfecting solutions.    CC/Reason for Visit: Annual preventive exam and subsequent Medicare wellness visit  HPI: Robert [name] is a 70 y.o. male who is coming in today for the above mentioned reasons. Past Medical History is significant for: Hypertension, hyperlipidemia, type 2 diabetes followed by endocrinology, atrial fibrillation chronically anticoagulated on Coumadin.  3 days ago he developed hematuria.  Went to urgent care and was prescribed antibiotics.  He feels like it is clearing up.  He has routine eye and dental care.  No perceived hearing issues.  He exercises by swimming at least twice a week.  He had a colonoscopy in 2020 and is a 3-year callback.  He is overdue for Tdap, shingles as well as his fourth COVID-vaccine.   Past Medical/Surgical History: Past Medical History:  Diagnosis Date   Diabetes mellitus type [name]    HLD (hyperlipidemia)    HTN (hypertension)    Hx of colonic polyps    Paroxysmal atrial fibrillation (HCC) 03/12/2019   Skin cancer    history of    Past Surgical History:  Procedure Laterality Date   COLONOSCOPY     PATELLAR TENDON REPAIR     Left   Removal of excess tissue     Right and Left upper eyelids    Social History:  reports that he quit smoking about 10 years ago. His smoking use included cigars. He has never used smokeless tobacco. He reports current alcohol use. He reports that he does not use drugs.  Allergies: No Known Allergies  Family History:  Family History  Problem Relation Age of Onset   Aortic aneurysm Other        family history   Cancer Other        Breast (1st degree relative)   Pancreatic  cancer Mother 55   Colon cancer Neg Hx    Rectal cancer Neg Hx    Stomach cancer Neg Hx    Esophageal cancer Neg Hx      Current Outpatient Medications:    atorvastatin (LIPITOR) 40 MG tablet, TAKE ONE TABLET BY MOUTH ONCE DAILY, Disp: 48 tablet, Rfl: 1   Coenzyme Q10 (COQ10 PO), Take 1 capsule by mouth daily. , Disp: , Rfl:    Dulaglutide (TRULICITY) 1.5 ZD/6.3OV SOPN, INJECT 1.5 MG INTO THE SKIN ONCE A WEEK., Disp: 2 mL, Rfl: 11   ELIQUIS 5 MG TABS tablet, TAKE ONE TABLET BY MOUTH TWICE DAILY, Disp: 60 tablet, Rfl: 5   glucose blood test strip, 1 each by Other route daily. Use daily for glucose control - Dx 250.00, Disp: 100 each, Rfl: 0   hydrochlorothiazide (MICROZIDE) 12.5 MG capsule, Take 1 capsule (12.5 mg total) by mouth daily., Disp: 90 capsule, Rfl: 1   lisinopril (ZESTRIL) 40 MG tablet, TAKE 1 AND 1/2 TABLETS BY MOUTH ONCE DAILY, Disp: 135 tablet, Rfl: 0   metFORMIN (GLUCOPHAGE) 1000 MG tablet, TAKE ONE TABLET BY MOUTH TWICE DAILY WITH FOOD, Disp: 180 tablet, Rfl: 1   propranolol (INDERAL) 10 MG tablet, Take 1 tablet (10 mg total) by mouth 4 (four) times daily as needed (palpitations, irregular or fast heart rate)., Disp: 30 tablet, Rfl: 9   sildenafil (REVATIO)  20 MG tablet, TAKE 1 TO 2 TABLETS 2 TO 3 HOURS BEFORE SEX, Disp: 30 tablet, Rfl: 5  Review of Systems:  Constitutional: Denies fever, chills, diaphoresis, appetite change and fatigue.  HEENT: Denies photophobia, eye pain, redness, hearing loss, ear pain, congestion, sore throat, rhinorrhea, sneezing, mouth sores, trouble swallowing, neck pain, neck stiffness and tinnitus.   Respiratory: Denies SOB, DOE, cough, chest tightness,  and wheezing.   Cardiovascular: Denies chest pain, palpitations and leg swelling.  Gastrointestinal: Denies nausea, vomiting, abdominal pain, diarrhea, constipation, blood in stool and abdominal distention.  Genitourinary: Denies dysuria, urgency, frequency, flank pain and difficulty urinating.   Endocrine: Denies: hot or cold intolerance, sweats, changes in hair or nails, polyuria, polydipsia. Musculoskeletal: Denies myalgias, back pain, joint swelling, arthralgias and gait problem.  Skin: Denies pallor, rash and wound.  Neurological: Denies dizziness, seizures, syncope, weakness, light-headedness, numbness and headaches.  Hematological: Denies adenopathy. Easy bruising, personal or family bleeding history  Psychiatric/Behavioral: Denies suicidal ideation, mood changes, confusion, nervousness, sleep disturbance and agitation    Physical Exam: Vitals:   04/20/21 1057  BP: 116/70  Pulse: 69  Temp: 97.6 F (36.4 C)  TempSrc: Oral  SpO2: 99%  Weight: 212 lb 1.6 oz (96.2 kg)  Height: 6\' 3"  (1.905 m)    Body mass index is 26.51 kg/m.   Constitutional: NAD, calm, comfortable Eyes: PERRL, lids and conjunctivae normal ENMT: Mucous membranes are moist. Posterior pharynx clear of any exudate or lesions. Normal dentition. Tympanic membrane is pearly white, no erythema or bulging. Neck: normal, supple, no masses, no thyromegaly Respiratory: clear to auscultation bilaterally, no wheezing, no crackles. Normal respiratory effort. No accessory muscle use.  Cardiovascular: Regular rate and rhythm, no murmurs / rubs / gallops. No extremity edema. 2+ pedal pulses. No carotid bruits.  Abdomen: no tenderness, no masses palpated. No hepatosplenomegaly. Bowel sounds positive.  Musculoskeletal: no clubbing / cyanosis. No joint deformity upper and lower extremities. Good ROM, no contractures. Normal muscle tone.  Skin: no rashes, lesions, ulcers. No induration Neurologic: CN 2-12 grossly intact. Sensation intact, DTR normal. Strength 5/5 in all 4.  Psychiatric: Normal judgment and insight. Alert and oriented x 3. Normal mood.   Subsequent Medicare wellness visit   1. Risk factors, based on past  M,S,F -cardiovascular disease risk factors include age, gender, history of diabetes, history of  hypertension, history of hyperlipidemia   2.  Physical activities: Swims and does yard work   3.  Depression/mood: Stable, not depressed   4.  Hearing: No perceived issues   5.  ADL's: Independent in all ADLs   6.  Fall risk: Low fall risk   7.  Home safety: No problems identified   8.  Height weight, and visual acuity: height and weight as above, vision:  Vision Screening   Right eye Left eye Both eyes  Without correction 20/32 20/32 20/25   With correction        9.  Counseling: Advised that he increase exercise to at least 30 minutes of moderate intensity aerobic activity 3 days a week   10. Lab orders based on risk factors: Laboratory update will be reviewed   11. Referral : None today   12. Care plan: Follow-up with me in 6 months   13. Cognitive assessment: No cognitive impairment noted   14. Screening: Patient provided with a written and personalized 5-10 year screening schedule in the AVS. yes   15. Provider List Update: PCP, cardiology  16. Advance Directives: Full code  17. Opioids: Patient is not on any opioid prescriptions and has no risk factors for a substance use disorder.   [place] Office Visit from 04/20/2021 in [place] [facility]  PHQ-9 Total Score 0       Fall Risk  04/20/2021 02/06/2020 07/23/2018 05/28/2018 03/01/2017  Falls in the past year? 0 0 No No No  Comment - - - Emmi Telephone Survey: data to providers prior to load -  Number falls in past yr: - 0 - - -  Injury with Fall? - 0 - - -     Impression and Plan:  Encounter for preventive health examination  -He has routine eye and dental care. -He is due to update his second COVID booster, Tdap and shingles at pharmacy. -Screening labs today. -Healthy lifestyle discussed in detail. -He had a colonoscopy in 2020 it is a 3-year callback.  Paroxysmal atrial fibrillation ([place])  Long term current use of anticoagulant -Eliquis -Currently controlled, followed by  cardiology.  Essential hypertension  - Plan: CBC with Differential/Platelet, Comprehensive metabolic panel  Hyperlipidemia, unspecified hyperlipidemia type  - Plan: Lipid panel  Type 2 diabetes mellitus without complication, without long-term current use of insulin (HCC) -Followed by endocrinology, A1c was well controlled at 6.5% in April.  Gross hematuria -He will return in 2 weeks for another urine sample, if persistent hematuria further work-up advised.    Patient Instructions  -Nice seeing you today!!  -Lab work today; will notify you once results are available.  -Pending vaccines: 2nd COVID booster, tdap (tetanus), shingles.  -Schedule follow up in 6 months.     Lelon Frohlich, MD [facility]

## 2021-04-20 NOTE — Telephone Encounter (Signed)
Reviewed with Dr Jerilee Hoh at office visit 04/20/21.

## 2021-04-21 ENCOUNTER — Encounter: Payer: Self-pay | Admitting: Internal Medicine

## 2021-04-29 ENCOUNTER — Other Ambulatory Visit (INDEPENDENT_AMBULATORY_CARE_PROVIDER_SITE_OTHER): Payer: Medicare HMO | Admitting: Internal Medicine

## 2021-04-29 DIAGNOSIS — R310 Gross hematuria: Secondary | ICD-10-CM | POA: Diagnosis not present

## 2021-04-29 LAB — POCT URINALYSIS DIPSTICK
Bilirubin, UA: NEGATIVE
Blood, UA: POSITIVE
Glucose, UA: POSITIVE — AB
Ketones, UA: NEGATIVE
Leukocytes, UA: NEGATIVE
Nitrite, UA: NEGATIVE
Protein, UA: POSITIVE — AB
Spec Grav, UA: 1.015 (ref 1.010–1.025)
Urobilinogen, UA: 0.2 E.U./dL
pH, UA: 6.5 (ref 5.0–8.0)

## 2021-04-30 ENCOUNTER — Other Ambulatory Visit: Payer: Medicare HMO

## 2021-04-30 ENCOUNTER — Other Ambulatory Visit: Payer: Self-pay

## 2021-04-30 ENCOUNTER — Other Ambulatory Visit: Payer: Self-pay | Admitting: Internal Medicine

## 2021-04-30 DIAGNOSIS — R310 Gross hematuria: Secondary | ICD-10-CM | POA: Diagnosis not present

## 2021-05-01 LAB — URINE CULTURE
MICRO NUMBER:: 12097232
Result:: NO GROWTH
SPECIMEN QUALITY:: ADEQUATE

## 2021-05-03 DIAGNOSIS — R310 Gross hematuria: Secondary | ICD-10-CM | POA: Diagnosis not present

## 2021-05-12 DIAGNOSIS — I700 Atherosclerosis of aorta: Secondary | ICD-10-CM | POA: Diagnosis not present

## 2021-05-12 DIAGNOSIS — N2889 Other specified disorders of kidney and ureter: Secondary | ICD-10-CM | POA: Diagnosis not present

## 2021-05-12 DIAGNOSIS — R310 Gross hematuria: Secondary | ICD-10-CM | POA: Diagnosis not present

## 2021-05-12 DIAGNOSIS — K5730 Diverticulosis of large intestine without perforation or abscess without bleeding: Secondary | ICD-10-CM | POA: Diagnosis not present

## 2021-05-14 ENCOUNTER — Ambulatory Visit: Payer: Medicare HMO | Admitting: Internal Medicine

## 2021-05-14 ENCOUNTER — Ambulatory Visit: Payer: Medicare HMO

## 2021-05-17 DIAGNOSIS — D414 Neoplasm of uncertain behavior of bladder: Secondary | ICD-10-CM | POA: Diagnosis not present

## 2021-05-17 DIAGNOSIS — Q6231 Congenital ureterocele, orthotopic: Secondary | ICD-10-CM | POA: Diagnosis not present

## 2021-05-17 DIAGNOSIS — K7689 Other specified diseases of liver: Secondary | ICD-10-CM | POA: Diagnosis not present

## 2021-05-18 ENCOUNTER — Other Ambulatory Visit: Payer: Self-pay | Admitting: Urology

## 2021-05-18 ENCOUNTER — Telehealth: Payer: Self-pay | Admitting: Pharmacist

## 2021-05-18 DIAGNOSIS — K7689 Other specified diseases of liver: Secondary | ICD-10-CM

## 2021-05-18 NOTE — Chronic Care Management (AMB) (Signed)
    Chronic Care Management Pharmacy Assistant   Name: THEODORA REGIS  MRN: [id number] DOB: 1951/02/07   Reason for Encounter: Medication Review-Medication Coordination Call   Recent office visits:  06.28.2022 Isaac Bliss, Rayford Halsted, MD annual exam for Hypertension, hyperlipidemia, type 2 diabetes  Recent consult visits:  None  Hospital visits:  None in previous 6 months  Medications: Outpatient Encounter Medications as of 05/18/2021  Medication Sig   atorvastatin (LIPITOR) 40 MG tablet TAKE ONE TABLET BY MOUTH ONCE DAILY   Coenzyme Q10 (COQ10 PO) Take 1 capsule by mouth daily.    Dulaglutide (TRULICITY) 1.5 0000000 SOPN INJECT 1.5 MG INTO THE SKIN ONCE A WEEK.   ELIQUIS 5 MG TABS tablet TAKE ONE TABLET BY MOUTH TWICE DAILY   glucose blood test strip 1 each by Other route daily. Use daily for glucose control - Dx 250.00   hydrochlorothiazide (MICROZIDE) 12.5 MG capsule Take 1 capsule (12.5 mg total) by mouth daily.   lisinopril (ZESTRIL) 40 MG tablet TAKE 1 AND 1/2 TABLETS BY MOUTH ONCE DAILY   metFORMIN (GLUCOPHAGE) 1000 MG tablet TAKE ONE TABLET BY MOUTH TWICE DAILY WITH FOOD   propranolol (INDERAL) 10 MG tablet Take 1 tablet (10 mg total) by mouth 4 (four) times daily as needed (palpitations, irregular or fast heart rate).   sildenafil (REVATIO) 20 MG tablet TAKE 1 TO 2 TABLETS 2 TO 3 HOURS BEFORE SEX   No facility-administered encounter medications on file as of 05/18/2021.   Reviewed chart for medication changes ahead of medication coordination call.  BP Readings from Last 3 Encounters:  04/20/21 116/70  01/28/21 140/82  08/07/20 130/62    Lab Results  Component Value Date   HGBA1C 6.5 (A) 01/28/2021    Patient obtains medications through Vials  30 Days  Last adherence delivery included:  Metoprolol 25 mg ER: one tablet daily Eliquis 5 mg: one tablet daily Lisinopril 40 mg: one tablet daily- abundance on hand Hydrochlorothiazide (MICROZIDE) 12.5 MG  capsule: one tablet daily- abundance on hand Metformin HCL 1,000 mg: one tablet twice daily Atorvastatin 40 mg: one tablet daily  Patient declined the following medication last month due to PRN use/additional supply on hand. Sildenafil (REVATIO) 20 MG tablet: one as needed  Patient is due for next adherence delivery on: 08.03.2022. Called patient and reviewed medications and coordinated delivery. This delivery to include: Metoprolol 25 mg ER: one tablet daily Eliquis 5 mg: one tablet daily Lisinopril 40 mg: one tablet daily Hydrochlorothiazide (MICROZIDE) 12.5 MG capsule: one tablet daily- abundance on hand Metformin HCL 1,000 mg: one tablet twice daily Atorvastatin 40 mg: one tablet daily  Coordinated acute fill for to be delivered 07.29.2022. Trulicity [id number] ML: inject 1.5 ml into the skin once a week  Patient declined the following medications due to  Sildenafil (REVATIO) 20 MG tablet: one as needed  He currently does not need refills Confirmed delivery date of 08.03.2022, advised patient that pharmacy will contact them the morning of delivery.  Care Gaps: Hep C screening Zoster Vaccines Foot Exam Covid-19 4th Booster  Star Rating Drugs: Medication Dispensed Quantity Pharmacy  Atorvastatin 40 mg 07.04.2022 [facility]  Metformin HCL 1000 mg 07.04.2022 60 [facility]  Lisinopril 40 mg 07.04.2022 [facility]    Amilia Revonda Standard, [facility] Pharmacist Assistant [phone]

## 2021-05-31 ENCOUNTER — Telehealth: Payer: Self-pay | Admitting: Cardiovascular Disease

## 2021-05-31 ENCOUNTER — Other Ambulatory Visit: Payer: Self-pay | Admitting: Urology

## 2021-05-31 NOTE — Telephone Encounter (Signed)
   [facility] Pre-operative Risk Assessment    Patient Name: Robert [name]  DOB: 1950-11-10 MRN: [id number]  HEARTCARE STAFF:  - IMPORTANT!!!!!! Under Visit Info/Reason for Call, type in Other and utilize the format Clearance MM/DD/YY or Clearance TBD. Do not use dashes or single digits. - Please review there is not already an duplicate clearance open for this procedure. - If request is for dental extraction, please clarify the # of teeth to be extracted. - If the patient is currently at the dentist's office, call Pre-Op Callback Staff (MA/nurse) to input urgent request.  - If the patient is not currently in the dentist office, please route to the Pre-Op pool.  Request for surgical clearance:  What type of surgery is being performed? Transurethral resection of bladder tumor    When is this surgery scheduled? 06/18/21  What type of clearance is required (medical clearance vs. Pharmacy clearance to hold med vs. Both)? Both  Are there any medications that need to be held prior to surgery and how long? Eliquis 3 days prior   Practice name and name of physician performing surgery? Dr. Festus Aloe, [facility]  What is the office phone number? [phone]   7.   What is the office fax number? [phone]  8.   Anesthesia type (None, local, MAC, general) ? General    Johnna Acosta 05/31/2021, 2:01 PM  _________________________________________________________________   (provider comments below)

## 2021-06-01 NOTE — Telephone Encounter (Signed)
Patient with diagnosis of atrial fibrillation on Eluqis for anticoagulation.    Procedure: transurethral resection of bladder tumor Date of procedure: 06/18/21   CHA2DS2-VASc Score = 3  This indicates a 3.2% annual risk of stroke. The patient's score is based upon: CHF History: No HTN History: Yes Diabetes History: Yes Stroke History: No Vascular Disease History: No Age Score: 1 Gender Score: 0    CrCl 113 (with adjusted body weight) Platelet count 177  Per office protocol, patient can hold Eliquis for 3 days prior to procedure.   Patient will not need bridging with Lovenox (enoxaparin) around procedure.

## 2021-06-01 NOTE — Telephone Encounter (Signed)
Dr. Acie Fredrickson to review, patient was seen exactly 1 year ago.  Typically we do require he presents for another office visit before final cardiac clearance, however this procedure sounds fairly urgent. Surgery date 06/18/2021. He was recently evaluated for hematuria and now requiring transurethral resection of bladder tumor.  Talking with the patient, he visited [place] for 5 days 2 months ago and was able to walk everywhere he wanted to without any issue.  He says he walked more than 36,000 steps in those 5 days.  He clearly can accomplish more than 4 METS of activity.  I plan to forward a message to our scheduler to arrange a follow-up appointment.  However does he require the follow-up prior to the upcoming surgery?  Please forward your response to P CV DIV PREOP

## 2021-06-01 NOTE — Telephone Encounter (Signed)
    Patient Name: Robert [name]  DOB: [birth year] MRN: [id number]  Primary Cardiologist: Mertie Moores, MD  Chart reviewed as part of pre-operative protocol coverage. Given past medical history and time since last visit, based on ACC/AHA guidelines, Robert [name] would be at acceptable risk for the planned procedure without further cardiovascular testing.   Per Dr. Acie Fredrickson, "He is at low risk for the upcoming procedure  He may hold the eliquis for 2-3 days for the procedure"  The patient was advised that if he develops new symptoms prior to surgery to contact our office to arrange for a follow-up visit, and he verbalized understanding.  I will route this recommendation to the requesting party via Epic fax function and remove from pre-op pool.  Please call with questions.  [place], [place] 06/01/2021, 7:30 PM

## 2021-06-07 ENCOUNTER — Ambulatory Visit
Admission: RE | Admit: 2021-06-07 | Discharge: 2021-06-07 | Disposition: A | Discharge status: Home / Self Care | Payer: Medicare HMO | Source: Ambulatory Visit | Attending: Urology | Admitting: Urology

## 2021-06-07 DIAGNOSIS — K769 Liver disease, unspecified: Secondary | ICD-10-CM | POA: Diagnosis not present

## 2021-06-07 DIAGNOSIS — N281 Cyst of kidney, acquired: Secondary | ICD-10-CM | POA: Diagnosis not present

## 2021-06-07 DIAGNOSIS — K7689 Other specified diseases of liver: Secondary | ICD-10-CM

## 2021-06-07 MED ORDER — GADOBENATE DIMEGLUMINE 529 MG/ML IV SOLN
20.0000 mL | Freq: Once | INTRAVENOUS | Status: AC | PRN
  Administered 2021-06-07: 20 mL via INTRAVENOUS

## 2021-06-16 ENCOUNTER — Encounter (HOSPITAL_BASED_OUTPATIENT_CLINIC_OR_DEPARTMENT_OTHER): Payer: Self-pay | Admitting: Urology

## 2021-06-16 ENCOUNTER — Other Ambulatory Visit: Payer: Self-pay | Admitting: Cardiovascular Disease

## 2021-06-16 ENCOUNTER — Other Ambulatory Visit: Payer: Self-pay

## 2021-06-16 NOTE — Progress Notes (Signed)
Spoke w/ via phone for pre-op interview--- Pt Lab needs dos---- Istat and EKG              Lab results------ no COVID test -----patient states asymptomatic no test needed Arrive at ------- 0830 on 06-18-2021 NPO after MN NO Solid Food.  Clear liquids from MN until--- 0730 Med rec completed Medications to take morning of surgery ----- NONE Diabetic medication ----- pt takes it at night Patient instructed no nail polish to be worn day of surgery Patient instructed to bring photo id and insurance card day of surgery Patient aware to have Driver (ride ) / caregiver  for 24 hours after surgery -- wife, Ilda Foil and cousin, Osie Bond Pt's wife has memory problems and can not remember per pt so he wants his cousin Ivin Booty to be with his wife in waiting area. Pt verbalized understanding when he checks in dos ask if it is ok. Patient Special Instructions ----- n/a Pre-Op special Istructions ----- pt has telephone cardiac clearance by Almyra Deforest PA on 06-01-2021 in epic/ chart Patient verbalized understanding of instructions that were given at this phone interview. Patient denies shortness of breath, chest pain, fever, cough at this phone interview.    Anesthesia Review:  HTN:  PAF;  DM2;  pt denied chest pain, sob with any activity, and no peripheral swelling.  PCP: Dr [name] Cardiologist : [name] (lov 05-25-2020 epic, pt was given clearance for surgery) Chest x-ray : no EKG : 05-25-2020 epic Echo : 11-16-2017 epic Stress test: no Cardiac Cath :  no Sleep Study/ CPAP :  NO Fasting Blood Sugar :  118--128    / Checks Blood Sugar -- times a day:  1-2 daily Blood Thinner/ Instructions /Last Dose: Eliquis ASA / Instructions/ Last Dose :  no Per pt was given instructions to stop eliquis by dr eskridge office from cardiology clearance ,  last dose evening of 06-14-2021

## 2021-06-17 ENCOUNTER — Telehealth: Payer: Self-pay | Admitting: Pharmacist

## 2021-06-17 NOTE — H&P (Signed)
Office Visit Report       --------------------------------------------------------------------------------   Robert [name]  [id number]  DOB: [birth year], 70 year old Male  SSN:    PRIMARY CARE:  Rayford Halsted. Isaac Bliss, MD  REFERRING:  Georgette Dover, MD  PROVIDER:  Festus Aloe, M.D.  LOCATION:  [facility] [phone]     --------------------------------------------------------------------------------   CC/HPI: F/u -    1) gross hematuria - he developed red urine with some small clots June 2022 when traveling. Also noted brown urine. His voiding well. AUASS = 8. No GU hx. He smoked for many years. No radiation or chemo.   A 04/20/2021 urine cx was negative, bun 10, cr 0.77. PSA 2.83 in 2019. UA with > 60 rbcs.   **He is on Eliquis for a fib.   He noted a resolution of the bleeding. CT a/p 05/12/2021 with a 15 mm are of soft tissue enhancement at right trigone/UO with mild right hydroureteronephrosis but good washout. Looks like a right URETEROCELE. No LAD or bone lesions. Partially exophytic area of nodular enhancement measuring 4.5 x 3.4 cm in segment IVb which equilibrates to background liver on delayed phases of contrast, and possibly representing differential perfusion in a nodular excrescence of hepatic parenchyma, but incompletely evaluated on the study. Further characterization with liver protocol abdominal MRI with and without contrast is suggested.   Cysto, 07/22, confirms a right ureterocele but interestingly papillary growth emanating from the right UO with some bleeding.       ALLERGIES: None   MEDICATIONS: Hydrochlorothiazide 12.5 mg capsule  Lisinopril 40 mg tablet  Metformin Hcl 1,000 mg tablet  Eliquis 5 mg tablet  Trulicity 1.5 99991111 ml pen injector     GU PSH: Locm 300-'399Mg'$ /Ml Iodine,1Ml - 05/12/2021     NON-GU PSH: Knee Arthroscopy/surgery     GU PMH: Gross hematuria - 05/12/2021, PSA was sent - DRE nl. Disc  causes of blood in urine and nature rb of CT and cystoscopy. He will proceed. , - 05/03/2021    NON-GU PMH: Atrial Fibrillation Diabetes Type 2 Hypercholesterolemia Hypertension    FAMILY HISTORY: cardiac disorder - Father Death of family member - Mother, Father pancreatic cancer - Mother    Notes: 1 son   SOCIAL HISTORY: Marital Status: Married Preferred Language: English; Ethnicity: Not Hispanic Or Latino; Race: White Current Smoking Status: Patient smokes. Has smoked since 04/23/1976. Smokes 1 pack per day.   Tobacco Use Assessment Completed: Used Tobacco in last 30 days? Does not use smokeless tobacco. Drinks 4 drinks per week.  Drinks 2 caffeinated drinks per day. Patient's occupation is/was retired.    REVIEW OF SYSTEMS:    GU Review Male:   Patient denies frequent urination, hard to postpone urination, burning/ pain with urination, get up at night to urinate, leakage of urine, stream starts and stops, trouble starting your stream, have to strain to urinate , erection problems, and penile pain.  Gastrointestinal (Upper):   Patient denies nausea, vomiting, and indigestion/ heartburn.  Gastrointestinal (Lower):   Patient denies diarrhea and constipation.  Constitutional:   Patient denies fever, night sweats, weight loss, and fatigue.  Skin:   Patient denies skin rash/ lesion and itching.  Eyes:   Patient denies blurred vision and double vision.  Ears/ Nose/ Throat:   Patient denies sinus problems and sore throat.  Hematologic/Lymphatic:   Patient denies swollen glands and easy bruising.  Cardiovascular:   Patient denies leg swelling and chest pains.  Respiratory:   Patient denies cough and shortness of breath.  Endocrine:   Patient denies excessive thirst.  Musculoskeletal:   Patient denies back pain and joint pain.  Neurological:   Patient denies headaches and dizziness.  Psychologic:   Patient denies depression and anxiety.   VITAL SIGNS: None   GU PHYSICAL  EXAMINATION:    Scrotum: No lesions. No edema. No cysts. No warts.  Urethral Meatus: Normal size. No lesion, no wart, no discharge, no polyp. Normal location.  Penis: Circumcised, no warts, no cracks. No dorsal Peyronie's plaques, no left corporal Peyronie's plaques, no right corporal Peyronie's plaques, no scarring, no warts. No balanitis, no meatal stenosis.   MULTI-SYSTEM PHYSICAL EXAMINATION:    Constitutional: Well-nourished. No physical deformities. Normally developed. Good grooming.  Neck: Neck symmetrical, not swollen. Normal tracheal position.  Respiratory: No labored breathing, no use of accessory muscles.   Cardiovascular: Normal temperature, normal extremity pulses, no swelling, no varicosities.  Skin: No paleness, no jaundice, no cyanosis. No lesion, no ulcer, no rash.  Neurologic / Psychiatric: Oriented to time, oriented to place, oriented to person. No depression, no anxiety, no agitation.  Gastrointestinal: No mass, no tenderness, no rigidity, non obese abdomen.     Complexity of Data:  X-Ray Review: C.T. Abdomen/Pelvis: Reviewed Films. 07/22    PROCEDURES:          Urinalysis w/Scope Dipstick Dipstick Cont'd Micro  Color: Yellow Bilirubin: Neg mg/dL WBC/hpf: NS (Not Seen)  Appearance: Slightly Cloudy Ketones: Neg mg/dL RBC/hpf: >60/hpf  Specific Gravity: 1.015 Blood: 3+ ery/uL Bacteria: NS (Not Seen)  pH: 6.5 Protein: Trace mg/dL Cystals: NS (Not Seen)  Glucose: Neg mg/dL Urobilinogen: 0.2 mg/dL Casts: NS (Not Seen)    Nitrites: Neg Trichomonas: Not Present    Leukocyte Esterase: Neg leu/uL Mucous: Not Present      Epithelial Cells: NS (Not Seen)      Yeast: NS (Not Seen)      Sperm: Not Present    ASSESSMENT:      ICD-10 Details  1 GU:   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 All questions answered. We went over the anatomy and procedure on the chart.   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 Contrast - liver nodule on CT           Schedule Return Visit/Planned Activity: Next Available Appointment - Schedule Surgery          Document Letter(s):  Created for Patient: Clinical Summary         Notes:   cc: Dr. Deniece Ree       * Signed by Festus Aloe, M.D. on 05/18/21 at 12:27 PM (EDT)*  The information contained in this medical record document is considered private and confidential patient information. This information can only be used for the medical diagnosis and/or medical services that are being provided by the patient's selected caregivers. This information can only be distributed outside of the patient's care if the patient agrees and signs waivers of authorization for this information to be sent to an outside source or route.

## 2021-06-17 NOTE — Chronic Care Management (AMB) (Signed)
Chronic Care Management Pharmacy Assistant   Name: Robert [name]  MRN: [id number] DOB: 14-Oct-1951  Reason for Encounter: Medication Review Medication Coordination Call     Recent office visits:  None  Recent consult visits:  None  Hospital visits:  None in previous 6 months  Medications: Outpatient Encounter Medications as of 06/17/2021  Medication Sig   atorvastatin (LIPITOR) 40 MG tablet TAKE ONE TABLET BY MOUTH ONCE DAILY (Patient taking differently: Take 40 mg by mouth at bedtime.)   Coenzyme Q10 (COQ10 PO) Take 1 capsule by mouth daily.  (Patient not taking: Reported on 06/16/2021)   Dulaglutide (TRULICITY) 1.5 0000000 SOPN INJECT 1.5 MG INTO THE SKIN ONCE A WEEK. (Patient taking differently: 1.5 mg once a week. Sunday's ;    INJECT 1.5 MG INTO THE SKIN ONCE A WEEK.)   ELIQUIS 5 MG TABS tablet TAKE ONE TABLET BY MOUTH TWICE DAILY   glucose blood test strip 1 each by Other route daily. Use daily for glucose control - Dx 250.00   hydrochlorothiazide (MICROZIDE) 12.5 MG capsule Take 1 capsule (12.5 mg total) by mouth daily.   lisinopril (ZESTRIL) 40 MG tablet TAKE 1 AND 1/2 TABLETS BY MOUTH ONCE DAILY (Patient taking differently: Take 40 mg by mouth daily. TAKE 1 AND 1/2 TABLETS BY MOUTH ONCE DAILY)   metFORMIN (GLUCOPHAGE) 1000 MG tablet TAKE ONE TABLET BY MOUTH TWICE DAILY WITH FOOD (Patient taking differently: Take 2,000 mg by mouth at bedtime.)   propranolol (INDERAL) 10 MG tablet Take 1 tablet (10 mg total) by mouth 4 (four) times daily as needed (palpitations, irregular or fast heart rate).   sildenafil (REVATIO) 20 MG tablet TAKE 1 TO 2 TABLETS 2 TO 3 HOURS BEFORE SEX   No facility-administered encounter medications on file as of 06/17/2021.   Reviewed chart for medication changes ahead of medication coordination call.  No OVs, Consults, or hospital visits since last care coordination call/Pharmacist visit.   No medication changes indicated  BP Readings from  Last 3 Encounters:  04/20/21 116/70  01/28/21 140/82  08/07/20 130/62    Lab Results  Component Value Date   HGBA1C 6.5 (A) 01/28/2021     Patient obtains medications through Vials  30 Days   Last adherence delivery included:  Metoprolol 25 mg ER: one tablet daily Eliquis 5 mg: one tablet daily Lisinopril 40 mg: one tablet daily Hydrochlorothiazide (MICROZIDE) 12.5 MG capsule: one tablet daily- abundance on hand Metformin HCL 1,000 mg: one tablet twice daily Atorvastatin 40 mg: one tablet daily   Coordinated acute fill for to be delivered 07.29.2022. Trulicity [id number] ML: inject 1.5 ml into the skin once a week   Patient declined the following medications due to  Sildenafil (REVATIO) 20 MG tablet: one as needed   Patient is due for next adherence delivery on: 06-28-2021. Called patient and reviewed medications and coordinated delivery.  This delivery to include: Metformin HCL 1,000 mg: one tablet twice daily Atorvastatin 40 mg: one tablet daily Eliquis 5 mg: one tablet twice daily Hydrochlorothiazide (MICROZIDE) 12.5 MG capsule: one tablet daily Lisinopril 40 mg: one and a half tablet daily (Patient states he is only taking one) Metoprolol 25 mg ER: one tablet daily     Patient will need a short fill of , prior to adherence delivery. Trulicity Q000111Q ML: inject 1.5 ml into the skin once a week does not have for his upcoming inj on the 28th  Coordinated acute fill for Trulicity to be delivered 06-18-21  after 3    Confirmed delivery date of 06-28-2021, advised patient that pharmacy will contact them the morning of delivery.    Notes: Gen Assessment Patient reports he is doing well has some outpatient surgery he Is preparing for on tomorrow. He reports no side effects with his medications. Coordinated acute form for Trulicity as he is out and will be needing for this Sunday. Patient in agreement states he is having no other issues or concerns at this time. Offered  appointment with Clinical Pharmacist in November he accepted.  Care Gaps: Hep C screening- Overdue Zoster Vaccines- Overdue Foot Exam - Overdue COVID Booster #4 Therapist, music) - Overdue Flu Vaccine - Overdue  CCM F/U call - Scheduled for 08-24-2021 AWV - MSG sent to Ramond Craver CMA to schedule.   Star Rating Drugs: Atorvastatin 40 mg - Last filled 04-26-2021 30 DS at [facility] Metformin 1000 mg - Last filled 04-26-2021 30 DS at [facility] Lisinopril 40 mg - Last filled - 04-26-2021 30 DS at [facility] Duaglutide 1.5 mg - Last filled 04-21-2021 28 DS at [facility] Pharmacist Assistant [phone]

## 2021-06-18 ENCOUNTER — Other Ambulatory Visit: Payer: Self-pay

## 2021-06-18 ENCOUNTER — Encounter (HOSPITAL_BASED_OUTPATIENT_CLINIC_OR_DEPARTMENT_OTHER): Payer: Self-pay | Admitting: Urology

## 2021-06-18 ENCOUNTER — Ambulatory Visit (HOSPITAL_BASED_OUTPATIENT_CLINIC_OR_DEPARTMENT_OTHER): Payer: Medicare HMO | Admitting: Certified Registered Nurse Anesthetist

## 2021-06-18 ENCOUNTER — Ambulatory Visit (HOSPITAL_BASED_OUTPATIENT_CLINIC_OR_DEPARTMENT_OTHER)
Admission: RE | Admit: 2021-06-18 | Discharge: 2021-06-18 | Disposition: A | Discharge status: Home / Self Care | Payer: Medicare HMO | Attending: Urology | Admitting: Urology

## 2021-06-18 ENCOUNTER — Encounter (HOSPITAL_BASED_OUTPATIENT_CLINIC_OR_DEPARTMENT_OTHER)
Admission: RE | Disposition: A | Discharge status: Home / Self Care | Payer: Self-pay | Source: Home / Self Care | Attending: Urology

## 2021-06-18 DIAGNOSIS — Z8249 Family history of ischemic heart disease and other diseases of the circulatory system: Secondary | ICD-10-CM | POA: Insufficient documentation

## 2021-06-18 DIAGNOSIS — E119 Type 2 diabetes mellitus without complications: Secondary | ICD-10-CM | POA: Diagnosis not present

## 2021-06-18 DIAGNOSIS — R310 Gross hematuria: Secondary | ICD-10-CM | POA: Diagnosis not present

## 2021-06-18 DIAGNOSIS — D494 Neoplasm of unspecified behavior of bladder: Secondary | ICD-10-CM | POA: Insufficient documentation

## 2021-06-18 DIAGNOSIS — D414 Neoplasm of uncertain behavior of bladder: Secondary | ICD-10-CM

## 2021-06-18 DIAGNOSIS — F17210 Nicotine dependence, cigarettes, uncomplicated: Secondary | ICD-10-CM | POA: Insufficient documentation

## 2021-06-18 DIAGNOSIS — Z7984 Long term (current) use of oral hypoglycemic drugs: Secondary | ICD-10-CM | POA: Diagnosis not present

## 2021-06-18 DIAGNOSIS — D4959 Neoplasm of unspecified behavior of other genitourinary organ: Secondary | ICD-10-CM | POA: Diagnosis not present

## 2021-06-18 DIAGNOSIS — I10 Essential (primary) hypertension: Secondary | ICD-10-CM | POA: Diagnosis not present

## 2021-06-18 DIAGNOSIS — E7800 Pure hypercholesterolemia, unspecified: Secondary | ICD-10-CM | POA: Insufficient documentation

## 2021-06-18 DIAGNOSIS — C676 Malignant neoplasm of ureteric orifice: Secondary | ICD-10-CM | POA: Diagnosis not present

## 2021-06-18 DIAGNOSIS — I4891 Unspecified atrial fibrillation: Secondary | ICD-10-CM | POA: Diagnosis not present

## 2021-06-18 DIAGNOSIS — Q6231 Congenital ureterocele, orthotopic: Secondary | ICD-10-CM | POA: Diagnosis not present

## 2021-06-18 DIAGNOSIS — C661 Malignant neoplasm of right ureter: Secondary | ICD-10-CM | POA: Diagnosis not present

## 2021-06-18 DIAGNOSIS — Z7901 Long term (current) use of anticoagulants: Secondary | ICD-10-CM | POA: Diagnosis not present

## 2021-06-18 DIAGNOSIS — D4121 Neoplasm of uncertain behavior of right ureter: Secondary | ICD-10-CM | POA: Diagnosis not present

## 2021-06-18 HISTORY — DX: Other specified disorders of kidney and ureter: N28.89

## 2021-06-18 HISTORY — DX: Personal history of other malignant neoplasm of skin: Z85.828

## 2021-06-18 HISTORY — PX: TRANSURETHRAL RESECTION OF BLADDER TUMOR: SHX2575

## 2021-06-18 HISTORY — DX: Nocturia: R35.1

## 2021-06-18 HISTORY — DX: Neoplasm of unspecified behavior of bladder: D49.4

## 2021-06-18 HISTORY — DX: Other specified postprocedural states: Z98.890

## 2021-06-18 HISTORY — DX: Long term (current) use of anticoagulants: Z79.01

## 2021-06-18 LAB — POCT I-STAT, CHEM 8
BUN: 14 mg/dL (ref 8–23)
Calcium, Ion: 1.21 mmol/L (ref 1.15–1.40)
Chloride: 100 mmol/L (ref 98–111)
Creatinine, Ser: 0.7 mg/dL (ref 0.61–1.24)
Glucose, Bld: 151 mg/dL — ABNORMAL HIGH (ref 70–99)
HCT: 47 % (ref 39.0–52.0)
Hemoglobin: 16 g/dL (ref 13.0–17.0)
Potassium: 4.2 mmol/L (ref 3.5–5.1)
Sodium: 137 mmol/L (ref 135–145)
TCO2: 25 mmol/L (ref 22–32)

## 2021-06-18 LAB — GLUCOSE, CAPILLARY: Glucose-Capillary: 133 mg/dL — ABNORMAL HIGH (ref 70–99)

## 2021-06-18 SURGERY — TURBT (TRANSURETHRAL RESECTION OF BLADDER TUMOR)
Anesthesia: General | Site: Urethra

## 2021-06-18 MED ORDER — STERILE WATER FOR IRRIGATION IR SOLN
Status: DC | PRN
  Administered 2021-06-18: 500 mL

## 2021-06-18 MED ORDER — BELLADONNA ALKALOIDS-OPIUM 16.2-60 MG RE SUPP
RECTAL | Status: AC
  Filled 2021-06-18: qty 1

## 2021-06-18 MED ORDER — ACETAMINOPHEN 10 MG/ML IV SOLN: 1000.0000 mg | Freq: Once | INTRAVENOUS | Status: DC | PRN

## 2021-06-18 MED ORDER — PROPOFOL 10 MG/ML IV BOLUS
INTRAVENOUS | Status: DC | PRN
  Administered 2021-06-18: 170 mg via INTRAVENOUS

## 2021-06-18 MED ORDER — LIDOCAINE 2% (20 MG/ML) 5 ML SYRINGE
INTRAMUSCULAR | Status: DC | PRN
  Administered 2021-06-18: 80 mg via INTRAVENOUS

## 2021-06-18 MED ORDER — STERILE WATER FOR IRRIGATION IR SOLN
Status: DC | PRN
  Administered 2021-06-18: 3000 mL

## 2021-06-18 MED ORDER — BELLADONNA ALKALOIDS-OPIUM 16.2-60 MG RE SUPP
RECTAL | Status: DC | PRN
  Administered 2021-06-18: 1 via RECTAL

## 2021-06-18 MED ORDER — DEXAMETHASONE SODIUM PHOSPHATE 10 MG/ML IJ SOLN
INTRAMUSCULAR | Status: DC | PRN
  Administered 2021-06-18: 4 mg via INTRAVENOUS

## 2021-06-18 MED ORDER — FENTANYL CITRATE (PF) 100 MCG/2ML IJ SOLN: 25.0000 ug | INTRAMUSCULAR | Status: DC | PRN

## 2021-06-18 MED ORDER — FENTANYL CITRATE (PF) 100 MCG/2ML IJ SOLN
INTRAMUSCULAR | Status: AC
  Filled 2021-06-18: qty 2

## 2021-06-18 MED ORDER — APIXABAN 5 MG PO TABS
5.0000 mg | ORAL_TABLET | Freq: Two times a day (BID) | ORAL | 5 refills | Status: DC
Start: 2021-06-20 — End: 2021-07-12

## 2021-06-18 MED ORDER — LACTATED RINGERS IV SOLN: INTRAVENOUS | Status: DC

## 2021-06-18 MED ORDER — PROPOFOL 10 MG/ML IV BOLUS
INTRAVENOUS | Status: AC
  Filled 2021-06-18: qty 20

## 2021-06-18 MED ORDER — ONDANSETRON HCL 4 MG/2ML IJ SOLN
INTRAMUSCULAR | Status: AC
  Filled 2021-06-18: qty 2

## 2021-06-18 MED ORDER — NITROFURANTOIN MONOHYD MACRO 100 MG PO CAPS: 100.0000 mg | ORAL_CAPSULE | Freq: Every day | ORAL | 0 refills | Status: DC

## 2021-06-18 MED ORDER — PROMETHAZINE HCL 25 MG/ML IJ SOLN: 6.2500 mg | INTRAMUSCULAR | Status: DC | PRN

## 2021-06-18 MED ORDER — DEXAMETHASONE SODIUM PHOSPHATE 10 MG/ML IJ SOLN
INTRAMUSCULAR | Status: AC
  Filled 2021-06-18: qty 1

## 2021-06-18 MED ORDER — ONDANSETRON HCL 4 MG/2ML IJ SOLN
INTRAMUSCULAR | Status: DC | PRN
  Administered 2021-06-18: 4 mg via INTRAVENOUS

## 2021-06-18 MED ORDER — SODIUM CHLORIDE 0.9 % IR SOLN
Status: DC | PRN
  Administered 2021-06-18: 3000 mL
  Administered 2021-06-18: 6000 mL

## 2021-06-18 MED ORDER — CEFAZOLIN SODIUM-DEXTROSE 2-4 GM/100ML-% IV SOLN
INTRAVENOUS | Status: AC
  Filled 2021-06-18: qty 100

## 2021-06-18 MED ORDER — LIDOCAINE HCL (PF) 2 % IJ SOLN
INTRAMUSCULAR | Status: AC
  Filled 2021-06-18: qty 5

## 2021-06-18 MED ORDER — CEFAZOLIN SODIUM-DEXTROSE 2-4 GM/100ML-% IV SOLN
2.0000 g | Freq: Once | INTRAVENOUS | Status: AC
  Administered 2021-06-18: 2 g via INTRAVENOUS

## 2021-06-18 MED ORDER — FENTANYL CITRATE (PF) 100 MCG/2ML IJ SOLN
INTRAMUSCULAR | Status: DC | PRN
  Administered 2021-06-18 (×3): 50 ug via INTRAVENOUS
  Administered 2021-06-18 (×2): 25 ug via INTRAVENOUS

## 2021-06-18 SURGICAL SUPPLY — 30 items
BAG DRAIN URO-CYSTO SKYTR STRL (DRAIN) ×2 IMPLANT
BAG DRN RND TRDRP ANRFLXCHMBR (UROLOGICAL SUPPLIES) ×1
BAG DRN UROCATH (DRAIN) ×1
BAG URINE DRAIN 2000ML AR STRL (UROLOGICAL SUPPLIES) ×2 IMPLANT
CATH FOLEY 2WAY SLVR  5CC 18FR (CATHETERS) ×2
CATH FOLEY 2WAY SLVR 5CC 18FR (CATHETERS) ×1 IMPLANT
CATH URET 5FR 28IN OPEN ENDED (CATHETERS) ×2 IMPLANT
CLOTH BEACON ORANGE TIMEOUT ST (SAFETY) ×2 IMPLANT
ELECT COAG BALL END 3FR (ELECTROSURGICAL) ×2
ELECT REM PT RETURN 9FT ADLT (ELECTROSURGICAL) ×2
ELECTRODE COAG BALL END 3FR (ELECTROSURGICAL) ×1 IMPLANT
ELECTRODE REM PT RTRN 9FT ADLT (ELECTROSURGICAL) ×1 IMPLANT
GLOVE SURG ENC MOIS LTX SZ6 (GLOVE) ×4 IMPLANT
GLOVE SURG ENC MOIS LTX SZ7.5 (GLOVE) ×2 IMPLANT
GLOVE SURG UNDER POLY LF SZ6 (GLOVE) ×4 IMPLANT
GOWN STRL REUS W/TWL LRG LVL3 (GOWN DISPOSABLE) ×6 IMPLANT
GUIDEWIRE STR DUAL SENSOR (WIRE) ×2 IMPLANT
HOLDER FOLEY CATH W/STRAP (MISCELLANEOUS) ×2 IMPLANT
IV NS IRRIG 3000ML ARTHROMATIC (IV SOLUTION) ×6 IMPLANT
KIT TURNOVER CYSTO (KITS) ×2 IMPLANT
LOOP CUT BIPOLAR 24F LRG (ELECTROSURGICAL) ×2 IMPLANT
MANIFOLD NEPTUNE II (INSTRUMENTS) ×2 IMPLANT
PACK CYSTO (CUSTOM PROCEDURE TRAY) ×2 IMPLANT
STENT URET 6FRX26 CONTOUR (STENTS) ×2 IMPLANT
SYR TOOMEY IRRIG 70ML (MISCELLANEOUS) ×2
SYRINGE TOOMEY IRRIG 70ML (MISCELLANEOUS) ×1 IMPLANT
TUBE CONNECTING 12X1/4 (SUCTIONS) ×2 IMPLANT
TUBING UROLOGY SET (TUBING) ×2 IMPLANT
WATER STERILE IRR 3000ML UROMA (IV SOLUTION) ×2 IMPLANT
WATER STERILE IRR 500ML POUR (IV SOLUTION) ×2 IMPLANT

## 2021-06-18 NOTE — Anesthesia Procedure Notes (Signed)
Procedure Name: LMA Insertion Date/Time: 06/18/2021 10:19 AM Performed by: Genelle Bal, CRNA Pre-anesthesia Checklist: Patient identified, Emergency Drugs available, Suction available and Patient being monitored Patient Re-evaluated:Patient Re-evaluated prior to induction Oxygen Delivery Method: Circle system utilized Preoxygenation: Pre-oxygenation with 100% oxygen Induction Type: IV induction Ventilation: Mask ventilation without difficulty LMA: LMA inserted LMA Size: 5.0 Number of attempts: 1 Airway Equipment and Method: Bite block Placement Confirmation: positive ETCO2 Tube secured with: Tape Dental Injury: Teeth and Oropharynx as per pre-operative assessment

## 2021-06-18 NOTE — Op Note (Signed)
Preoperative diagnosis: Bladder neoplasm Postoperative diagnosis: Bladder neoplasm, right distal ureteral neoplasm  Procedure: Cystoscopy, TURBT 2 to 5 cm, right diagnostic ureteroscopy with fulguration of ureteral tumor, right ureteral stent placement  Surgeon: Junious Silk  Anesthesia: General  Indication for procedure: Robert [name] is a 70 year old male with gross hematuria.  CT scan revealed right posterior bladder mass and on office cystoscopy it appeared to be papillary neoplasm emanating from the right ureteral orifice inside a ureterocele.  Findings: On exam under anesthesia the penis was circumcised without mass or lesion.  Testicles descended bilaterally and palpably normal.  On DRE the prostate was about 30 g and smooth without hard area or nodule.  No pelvic mass noted.  On cystoscopy the urethra and prostate were unremarkable.  No stone or foreign body in bladder.  Cystoscopy confirmed papillary tumor inside a right ureterocele.  On resection the entire right ureterocele was full of tumor and resected.  I resected this all the way back to where it appeared to be free of tumor and the right ureteral orifice was visualized.  The lateral bladder walls along the resection site were quite thin and he will need a Foley catheter and a stent for some time.  On right ureteroscopy there was some early papillary changes primarily to the distal ureter along the posterior medial side this was fulgurated with a Bugbee electrode no other tumor was noted further up into the mid and proximal ureter.  Description of procedure: After consent was obtained patient brought to the operating room.  After adequate anesthesia was placed lithotomy position and prepped and draped in the usual sterile fashion.  Timeout was performed to confirm the patient and procedure.  The cystoscope was passed per urethra and the bladder carefully inspected with a 30 degree and 70 degree lens.  No other tumor was noted.  I then  passed the handle and loop and resected the right ureteral orifice and tumor popped out of her right ureterocele.  The right ureterocele was completely resected and removed.  The entire ureterocele was full of papillary tumor.  It was resected down into the base of the ureterocele until it was clear.  I could see the right ureteral orifice now further laterally and there was a slight bit of tumor remaining there and this was resected.  Now using the loop to manipulate the UO I could see that it was clear although the ureter and bladder wall were almost completely resected it was thin laterally.  I then under direct vision passed a sensor wire.  I then passed a digital ureteroscope into the distal ureter there was no other tumor but some early papillary changes posterior medial on the distal ureter.  I estimate this to be about a 10 to 15 cm patch.  I took a ureteroscopic Bugbee and fulgurated the entire area.  I did not see any other abnormal tumor.  I went up into the mid to proximal ureter did not visualize other tumor.  I then backed the scope out.  I repassed the cystoscope and using the ureteroscopic Bugbee Bugbee to few small sites that were oozing.  I then backloaded the wire on the cystoscope and a 626 cm stent was advanced.  The wire was removed with a good coil seen in the kidney and a good coil in the bladder.  The scope was removed and a 41 French Foley catheter was placed left to gravity drainage.  I then did an exam under anesthesia and placed a  BNO suppository.  He was awakened taken the cover room in stable condition.  Complications: None  Blood loss: Minimal  Specimens pathology: Right ureterocele papillary tumor resection  Drains: 6 x 26 cm right ureteral stent, 18 French Foley catheter  Disposition: Patient stable to PACU-I would leave the catheter for about 10 days.  Stent will remain for 3 weeks.  Depending on the grade we could consider simply repeat endoscopic evaluation versus BCG  then repeat evaluation.  Discussed with his wife and cousin.  We discussed the procedure, postop care and follow-up.

## 2021-06-18 NOTE — Discharge Instructions (Signed)

## 2021-06-18 NOTE — Transfer of Care (Signed)
Immediate Anesthesia Transfer of Care Note  Patient: Robert [name]  Procedure(s) Performed: TRANSURETHRAL RESECTION OF BLADDER TUMOR (TURBT) WITH RIGHT URETERAL STENT PLACEMENT, RIGHT URETEROSCOPY WITH DISTRACTION OF TUMOR, FULGURATION (Urethra)  Patient Location: PACU  Anesthesia Type:General  Level of Consciousness: awake, alert  and oriented  Airway & Oxygen Therapy: Patient Spontanous Breathing and Patient connected to face mask oxygen  Post-op Assessment: Report given to RN and Post -op Vital signs reviewed and stable  Post vital signs: Reviewed and stable  Last Vitals:  Vitals Value Taken Time  BP 130/80 06/18/21 1135  Temp    Pulse 77 06/18/21 1136  Resp 19 06/18/21 1136  SpO2 97 % 06/18/21 1136  Vitals shown include unvalidated device data.  Last Pain:  Vitals:   06/18/21 0848  TempSrc: Oral  PainSc: 0-No pain      Patients Stated Pain Goal: 4 ([id number])  Complications: No notable events documented.

## 2021-06-18 NOTE — Interval H&P Note (Signed)
History and Physical Interval Note:  06/18/2021 10:10 AM  Robert [name]  has presented today for surgery, with the diagnosis of BLADDER NEOPLASM, URETEROCELE.  The various methods of treatment have been discussed with the patient and family. After consideration of risks, benefits and other options for treatment, the patient has consented to  Procedure(s): TRANSURETHRAL RESECTION OF BLADDER TUMOR (TURBT) WITH RIGHT URETERAL STENT PLACEMENT (N/A) as a surgical intervention.  The patient's history has been reviewed, patient examined, no change in status, stable for surgery.  I have reviewed the patient's chart and labs. He asked about "the stent" and I drew him a picture of the anatomy and again went over the nature, potential benefits, risks and alternatives to cysto, TURBT, right stent placement, including side effects of the proposed treatment, the likelihood of the patient achieving the goals of the procedure, and any potential problems that might occur during the procedure or recuperation. We discussed string vs cysto stent pull. Patient elects to proceed. Also discussed possible risk of recurrence and need for surveillance over time in office. Questions were answered to the patient's satisfaction.     Festus Aloe

## 2021-06-18 NOTE — Anesthesia Postprocedure Evaluation (Signed)
Anesthesia Post Note  Patient: Robert [name]  Procedure(s) Performed: TRANSURETHRAL RESECTION OF BLADDER TUMOR (TURBT) WITH RIGHT URETERAL STENT PLACEMENT, RIGHT URETEROSCOPY WITH DISTRACTION OF TUMOR, FULGURATION (Urethra)     Patient location during evaluation: PACU Anesthesia Type: General Level of consciousness: awake and alert Pain management: pain level controlled Vital Signs Assessment: post-procedure vital signs reviewed and stable Respiratory status: spontaneous breathing, nonlabored ventilation, respiratory function stable and patient connected to nasal cannula oxygen Cardiovascular status: blood pressure returned to baseline and stable Postop Assessment: no apparent nausea or vomiting Anesthetic complications: no   No notable events documented.  Last Vitals:  Vitals:   06/18/21 1200 06/18/21 1227  BP: 124/73 139/75  Pulse: 66 61  Resp: 16 18  Temp: 36.5 C (!) 36.4 C  SpO2: 95% 98%    Last Pain:  Vitals:   06/18/21 1227  TempSrc:   PainSc: 0-No pain                 Belenda Cruise P Chalsey Leeth

## 2021-06-18 NOTE — Anesthesia Preprocedure Evaluation (Signed)
Anesthesia Evaluation  Patient identified by MRN, date of birth, ID band Patient awake    Reviewed: Allergy & Precautions, NPO status , Patient's Chart, lab work & pertinent test results  Airway Mallampati: II  TM Distance: >3 FB Neck ROM: Full    Dental  (+) Teeth Intact   Pulmonary neg pulmonary ROS, former smoker,    Pulmonary exam normal        Cardiovascular hypertension, Pt. on medications and Pt. on home beta blockers + dysrhythmias (ON Eliquis) Atrial Fibrillation  Rhythm:Regular Rate:Normal     Neuro/Psych negative neurological ROS  negative psych ROS   GI/Hepatic negative GI ROS, Neg liver ROS,   Endo/Other  diabetes, Well Controlled, Type 2, Oral Hypoglycemic Agents  Renal/GU negative Renal ROS Bladder dysfunction  Bladder Ca    Musculoskeletal negative musculoskeletal ROS (+)   Abdominal (+)  Abdomen: soft. Bowel sounds: normal.  Peds  Hematology negative hematology ROS (+)   Anesthesia Other Findings   Reproductive/Obstetrics                            Anesthesia Physical Anesthesia Plan  ASA: 3  Anesthesia Plan: General   Post-op Pain Management:    Induction: Intravenous  PONV Risk Score and Plan: 2 and Ondansetron, Dexamethasone and Treatment may vary due to age or medical condition  Airway Management Planned: Mask and LMA  Additional Equipment: None  Intra-op Plan:   Post-operative Plan: Extubation in OR  Informed Consent: I have reviewed the patients History and Physical, chart, labs and discussed the procedure including the risks, benefits and alternatives for the proposed anesthesia with the patient or authorized representative who has indicated his/her understanding and acceptance.     Dental advisory given  Plan Discussed with: CRNA  Anesthesia Plan Comments:         Anesthesia Quick Evaluation

## 2021-06-21 ENCOUNTER — Encounter (HOSPITAL_BASED_OUTPATIENT_CLINIC_OR_DEPARTMENT_OTHER): Payer: Self-pay | Admitting: Urology

## 2021-06-21 LAB — SURGICAL PATHOLOGY

## 2021-06-22 ENCOUNTER — Other Ambulatory Visit: Payer: Self-pay | Admitting: Cardiovascular Disease

## 2021-07-02 DIAGNOSIS — R310 Gross hematuria: Secondary | ICD-10-CM | POA: Diagnosis not present

## 2021-07-02 DIAGNOSIS — R8279 Other abnormal findings on microbiological examination of urine: Secondary | ICD-10-CM | POA: Diagnosis not present

## 2021-07-02 DIAGNOSIS — C678 Malignant neoplasm of overlapping sites of bladder: Secondary | ICD-10-CM | POA: Diagnosis not present

## 2021-07-09 DIAGNOSIS — C678 Malignant neoplasm of overlapping sites of bladder: Secondary | ICD-10-CM | POA: Diagnosis not present

## 2021-07-12 ENCOUNTER — Other Ambulatory Visit: Payer: Self-pay | Admitting: Cardiovascular Disease

## 2021-07-12 ENCOUNTER — Other Ambulatory Visit: Payer: Self-pay | Admitting: Internal Medicine

## 2021-07-12 DIAGNOSIS — I10 Essential (primary) hypertension: Secondary | ICD-10-CM

## 2021-07-12 NOTE — Telephone Encounter (Signed)
Prescription refill request for Eliquis received. Indication:Afib  Last office visit: 05/25/20 (Pt has scheduled office visit on 08/17/21 with Dr Acie Fredrickson)  Scr: 0.77 (04/20/21) Age: 70 Weight: 95.8kg  Appropriate dose and refill sent to requested pharmacy.

## 2021-07-15 ENCOUNTER — Other Ambulatory Visit: Payer: Self-pay | Admitting: Internal Medicine

## 2021-07-15 DIAGNOSIS — I10 Essential (primary) hypertension: Secondary | ICD-10-CM

## 2021-07-19 ENCOUNTER — Telehealth: Payer: Self-pay | Admitting: Pharmacist

## 2021-07-19 NOTE — Chronic Care Management (AMB) (Addendum)
Chronic Care Management Pharmacy Assistant   Name: Robert [name]  MRN: [id number] DOB: [birth year]  Reason for Encounter: Medication Review / Medication Coordination Call   Recent office visits:  None  Recent consult visits:  None  Hospital visits:  Medication Reconciliation was completed by comparing discharge summary, patient's EMR and Pharmacy list, and upon discussion with patient.  06-18-2021 [place] - Patient presented for Transurethral resection of bladder tumor. Patient was there for 5 hours.  Medications that remain the same after Hospital Discharge:??  -All other medications will remain the same.    Medications: Outpatient Encounter Medications as of 07/19/2021  Medication Sig   atorvastatin (LIPITOR) 40 MG tablet TAKE ONE TABLET BY MOUTH ONCE DAILY (Patient taking differently: Take 40 mg by mouth at bedtime.)   Coenzyme Q10 (COQ10 PO) Take 1 capsule by mouth daily.  (Patient not taking: No sig reported)   Dulaglutide (TRULICITY) 1.5 TK/3.5WS SOPN INJECT 1.5 MG INTO THE SKIN ONCE A WEEK. (Patient taking differently: 1.5 mg once a week. Sunday's ;    INJECT 1.5 MG INTO THE SKIN ONCE A WEEK.)   ELIQUIS 5 MG TABS tablet TAKE ONE TABLET BY MOUTH TWICE DAILY   glucose blood test strip 1 each by Other route daily. Use daily for glucose control - Dx 250.00   hydrochlorothiazide (MICROZIDE) 12.5 MG capsule TAKE ONE CAPSULE BY MOUTH DAILY   lisinopril (ZESTRIL) 40 MG tablet TAKE 1 AND 1/2 TABLETS BY MOUTH DAILY   metFORMIN (GLUCOPHAGE) 1000 MG tablet TAKE ONE TABLET BY MOUTH TWICE DAILY WITH FOOD (Patient taking differently: Take 2,000 mg by mouth at bedtime.)   nitrofurantoin, macrocrystal-monohydrate, (MACROBID) 100 MG capsule Take 1 capsule (100 mg total) by mouth at bedtime.   propranolol (INDERAL) 10 MG tablet Take 1 tablet (10 mg total) by mouth 4 (four) times daily as needed (palpitations, irregular or fast heart rate).   sildenafil (REVATIO) 20  MG tablet TAKE 1 TO 2 TABLETS 2 TO 3 HOURS BEFORE SEX   No facility-administered encounter medications on file as of 07/19/2021.   Reviewed chart for medication changes ahead of medication coordination call.  No OVs, Consults, or hospital visits since last care coordination call/Pharmacist visit. (If appropriate, list visit date, provider name)  No medication changes indicated OR if recent visit, treatment plan here.  BP Readings from Last 3 Encounters:  06/18/21 139/75  04/20/21 116/70  01/28/21 140/82    Lab Results  Component Value Date   HGBA1C 6.5 (A) 01/28/2021     Patient obtains medications through Vials  30 Days   Last adherence delivery included:  This delivery to include: Metformin HCL 1,000 mg: one tablet twice daily Atorvastatin 40 mg: one tablet daily Eliquis 5 mg: one tablet twice daily Hydrochlorothiazide (MICROZIDE) 12.5 MG capsule: one tablet daily Lisinopril 40 mg: one and a half tablet daily (Patient states he is only taking one) Metoprolol 25 mg ER: one tablet daily        Patient will need a short fill of , prior to adherence delivery. Trulicity 5.6/8.1 ML: inject 1.5 ml into the skin once a week does not have for his upcoming inj on the 28th   Coordinated acute fill for Trulicity to be delivered 06-18-21 after 3   Patient is due for next adherence delivery on: 07-28-2021. Called patient and reviewed medications and coordinated delivery. Confirmed Vials w/o safety caps for 30 DS  This delivery to include: Metformin HCL 1,000 mg: one  tablet twice daily at bedtime Atorvastatin 40 mg: one tablet daily at bedtime Hydrochlorothiazide (MICROZIDE) 12.5 MG capsule: one tablet daily Lisinopril 40 mg: one and a half tablet daily (Patient states he is only taking one) *Trulicity due 45/03 pt to call me prior to be sure it coordinate fill week prior   Patient declined the following medications (meds) due to (reason) Eliquis 5 mg: one tablet twice daily  (Abundance on hand has had to hold on it a few times for surgery will call)  Confirmed delivery date of 07-28-2021, advised patient that pharmacy will contact them the morning of delivery.    Care Gaps: Hep C screening- Overdue Zoster Vaccines- Overdue Foot Exam - Overdue COVID Booster #4 Therapist, music) - Overdue Flu Vaccine - Overdue  CCM F/U call - Scheduled for 08-24-2021 AWV - Done 04-20-21     Star Rating Drugs: Atorvastatin 40 mg - Last filled 06-28-2021 30 DS at [facility] Metformin 1000 mg - Last filled 06-28-2021 30 DS at [facility] Lisinopril 40 mg - Last filled - 06-28-2021 30 DS at [facility] Duaglutide 1.5 mg - Last filled 06-18-2021 28 DS at [facility]

## 2021-07-21 DIAGNOSIS — R310 Gross hematuria: Secondary | ICD-10-CM | POA: Diagnosis not present

## 2021-07-28 DIAGNOSIS — Z5111 Encounter for antineoplastic chemotherapy: Secondary | ICD-10-CM | POA: Diagnosis not present

## 2021-07-28 DIAGNOSIS — C678 Malignant neoplasm of overlapping sites of bladder: Secondary | ICD-10-CM | POA: Diagnosis not present

## 2021-08-02 ENCOUNTER — Telehealth: Payer: Self-pay | Admitting: Pharmacist

## 2021-08-02 NOTE — Chronic Care Management (AMB) (Signed)
Chronic Care Management Pharmacy Assistant   Name: Robert [name]  MRN: [id number] DOB: [birth year]   Reason for Encounter: Medication Review : Coordinate Acute fill for Trulicity, Lisinopril Question    Recent office visits:  None  Recent consult visits:  None  Hospital visits:  Medication Reconciliation was completed by comparing discharge summary, patient's EMR and Pharmacy list, and upon discussion with patient.   06-18-2021 [place] - Patient presented for Transurethral resection of bladder tumor. Patient was there for 5 hours.   Medications that remain the same after Hospital Discharge:??  -All other medications will remain the same.    Medications: Outpatient Encounter Medications as of 08/02/2021  Medication Sig   atorvastatin (LIPITOR) 40 MG tablet TAKE ONE TABLET BY MOUTH ONCE DAILY (Patient taking differently: Take 40 mg by mouth at bedtime.)   Coenzyme Q10 (COQ10 PO) Take 1 capsule by mouth daily.  (Patient not taking: No sig reported)   Dulaglutide (TRULICITY) 1.5 FW/2.6VZ SOPN INJECT 1.5 MG INTO THE SKIN ONCE A WEEK. (Patient taking differently: 1.5 mg once a week. Sunday's ;    INJECT 1.5 MG INTO THE SKIN ONCE A WEEK.)   ELIQUIS 5 MG TABS tablet TAKE ONE TABLET BY MOUTH TWICE DAILY   glucose blood test strip 1 each by Other route daily. Use daily for glucose control - Dx 250.00   hydrochlorothiazide (MICROZIDE) 12.5 MG capsule TAKE ONE CAPSULE BY MOUTH DAILY   lisinopril (ZESTRIL) 40 MG tablet TAKE 1 AND 1/2 TABLETS BY MOUTH DAILY   metFORMIN (GLUCOPHAGE) 1000 MG tablet TAKE ONE TABLET BY MOUTH TWICE DAILY WITH FOOD (Patient taking differently: Take 2,000 mg by mouth at bedtime.)   nitrofurantoin, macrocrystal-monohydrate, (MACROBID) 100 MG capsule Take 1 capsule (100 mg total) by mouth at bedtime.   propranolol (INDERAL) 10 MG tablet Take 1 tablet (10 mg total) by mouth 4 (four) times daily as needed (palpitations, irregular or fast heart  rate).   sildenafil (REVATIO) 20 MG tablet TAKE 1 TO 2 TABLETS 2 TO 3 HOURS BEFORE SEX   No facility-administered encounter medications on file as of 08/02/2021.  Notes: Call to patient to check and see if we should submit an acute fill form for his Trulicity that he should be needing soon per my notes. Patient advised he has one left so will need after next week as he takes it on Sunday's, Offered delivery date of 10-20 and he was in agreement, he stated he is good with his Eliquis not needing that as of yet as he has had to hold on it for a few procedures so still has quite a bit he notes he will call when in need of it. Per staff msg from [facility], confirmed that he is indeed only taking the one tablet of Lisinopril and not the One and a half as prescribed. He noted that is correct and that he was not aware of any changes to the prescription, he notes he will speak with his PCP on the dosage.  Care Gaps: Hep C screening- Overdue Zoster Vaccines- Overdue Foot Exam - Overdue COVID Booster #4 Therapist, music) - Overdue Flu Vaccine - Overdue  CCM F/U call - Scheduled for 08-24-2021 AWV - Done 04-20-21 BP - 116/70 (03-2021) A1C - 6.5  Star Rating Drugs: Atorvastatin 40 mg - Last filled 07-28-2021 30 DS at [facility] Metformin 1000 mg - Last filled 07-28-2021 30 DS at [facility] Lisinopril 40 mg - Last filled - 07-28-2021 30 DS  at [facility] Duaglutide 1.5 mg -Coord Acute fill for 08-12-21 with [facility]  Ned Clines [place] Clinical Pharmacist Assistant [phone]

## 2021-08-03 ENCOUNTER — Ambulatory Visit (INDEPENDENT_AMBULATORY_CARE_PROVIDER_SITE_OTHER): Payer: Medicare HMO | Admitting: Internal Medicine

## 2021-08-03 ENCOUNTER — Other Ambulatory Visit: Payer: Self-pay

## 2021-08-03 ENCOUNTER — Encounter: Payer: Self-pay | Admitting: Internal Medicine

## 2021-08-03 VITALS — BP 136/74 | HR 75 | Ht 75.0 in | Wt 209.8 lb

## 2021-08-03 DIAGNOSIS — E785 Hyperlipidemia, unspecified: Secondary | ICD-10-CM | POA: Diagnosis not present

## 2021-08-03 DIAGNOSIS — E119 Type 2 diabetes mellitus without complications: Secondary | ICD-10-CM | POA: Diagnosis not present

## 2021-08-03 DIAGNOSIS — E663 Overweight: Secondary | ICD-10-CM | POA: Diagnosis not present

## 2021-08-03 LAB — POCT GLYCOSYLATED HEMOGLOBIN (HGB A1C): Hemoglobin A1C: 6.7 % — AB (ref 4.0–5.6)

## 2021-08-03 MED ORDER — GLIPIZIDE 5 MG PO TABS: 2.5000 mg | ORAL_TABLET | Freq: Every day | ORAL | 3 refills | Status: DC

## 2021-08-03 NOTE — Progress Notes (Signed)
Patient ID: Robert [name], male   DOB: 12/08/1950, 70 y.o.   MRN: [id number]   This visit occurred during the SARS-CoV-2 public health emergency.  Safety protocols were in place, including screening questions prior to the visit, additional usage of staff PPE, and extensive cleaning of exam room while observing appropriate contact time as indicated for disinfecting solutions.   HPI: Robert [name] is a 70 y.o.-year-old male, returning for follow-up for  DM2, dx in 2000s, insulin-independent since 2018, uncontrolled, with complications (PN, ED).  Last visit 6 months ago.  Interim history: Since last visit, he was diagnosed with bladder cancer and had transurethral resection on 06/18/2021.  The cancer was high-grade, but noninvasive. He is getting bladder instillations - off and on Eliquis. 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 No increased urination, blurry vision, nausea, chest pain.  Reviewed HbA1c levels: Lab Results  Component Value Date   HGBA1C 6.5 (A) 01/28/2021   HGBA1C 6.4 (A) 07/30/2020   HGBA1C 6.4 (A) 03/26/2020   Pt is on a regimen of: - Trulicity 1.5 mg weekly. - Metformin 1000 mg 2x a day >> 2000 mg with dinner - Glipizide 5 mg before a large dinner- added 12/2018 -did not take it We stopped Novolog 70/30 10 units 2x a day in 11/2017, when starting Trulicity.  Pt checks his sugars 1-2 times a day: - am:  100-140, 152, 161 >> 99, 110-147, 151, 158, 160 (forgot meds) - 2h after b'fast:150 >> 130, 139 >> 134 >> n/c >> 119-147, 157 - before lunch: 107, 140 >> 106,138, 194 (PB) >> n/c >> 115, 133 - 2h after lunch: 130-204, 224  >> n/c >> 150 >> n/c >> 112-143, 157 - before dinner: 96-125, 144, 199 (icecream) >> 110 >> n/c >> 152 - 2h after dinner: 145-157 >> 140-172, 190, 217 >> 102-149, 177 - bedtime: 82-128, 140 >>  106-140 >> 125-130 >> 110-135 - nighttime:51/62, 80-126 >> 129 >> 91 (wine) >> 118 >> n/c Lowest sugar was 28 right after starting insulin >> ...58 >> 99; he has hypoglycemia awareness in the 70s. Highest sugar was 166 >> 217 >> 177.  Glucometer: ReliOn  Pt's meals are: - Breakfast: whole wheat toast + butte - Lunch: tuna salad or tuna sandwich ([employer] delivery), soft taco, meat + baked beans or potatoes - Dinner: [employer] delivery - Snacks: 1-0: nuts, bananas, other fruit He decreased his alcohol intake. He was swimming 5 times a week before the coronavirus pandemic, now exercising at home.  -No CKD; last BUN/creatinine:  Lab Results  Component Value Date   BUN 14 06/18/2021   BUN 10 04/20/2021   CREATININE 0.70 06/18/2021   CREATININE 0.77 04/20/2021  On lisinopril 40.  -+ HL; last set of lipids: Lab Results  Component Value Date   CHOL 100 04/20/2021   HDL 41.40 04/20/2021   LDLCALC 35 04/20/2021   LDLDIRECT 77.0 07/07/2015   TRIG 119.0 04/20/2021   CHOLHDL 2 04/20/2021  On Lipitor 40 and co-Q10.  - last eye exam was in 10/2020: No DR, + cataract  -he has numbness and tingling in his feet  He has a h/o arrhythmia (A fib) - wore an event monitor >> now on beta blocker. Cardiologist: Dr. Acie Fredrickson.  ROS: + See HPI Neurological: no tremors/+ numbness/+ tingling/no dizziness  I  reviewed pt's medications, allergies, PMH, social hx, family hx, and changes were documented in the history of present illness. Otherwise, unchanged from my initial visit note.  Past Medical History:  Diagnosis Date   Anticoagulant long-term use    eliquis--- managed by cardiology   Bladder neoplasm    Diabetes mellitus type [name]    endocrinologist--- [name] --  (06-16-2021 per pt check blood sugar 1-2 times daily,  fasting sugar-- 118-128)   History of basal cell carcinoma (BCC) excision    History of COVID-19 05/2019   per pt mild symptoms that resolved   HLD (hyperlipidemia)     HTN (hypertension)    Hx of colonic polyps    Nocturia    Paroxysmal atrial fibrillation ([place]) 03/12/2019   cardiologist--- [name]   Ureterocele    Past Surgical History:  Procedure Laterality Date   BLEPHAROPLASTY Bilateral    per pt approx  2001;   upper eyelid's   COLONOSCOPY     last one approx 2020   PATELLAR TENDON REPAIR Left 04/16/2004   @[facility]    TRANSURETHRAL RESECTION OF BLADDER TUMOR N/A 06/18/2021   Procedure: TRANSURETHRAL RESECTION OF BLADDER TUMOR (TURBT) WITH RIGHT URETERAL STENT PLACEMENT, RIGHT URETEROSCOPY WITH DISTRACTION OF TUMOR, FULGURATION;  Surgeon: Festus Aloe, MD;  Location: [place];  Service: Urology;  Laterality: N/A;   Social History   Socioeconomic History   Marital status: Married    Spouse name: Not on file   Number of children: 2  Social Needs  Occupational History   Occupation: [employer]   Occupation: Cabin crew: Self Employed, retired  Tobacco Use   Smoking status: Former Smoker    Types: Cigars    Last attempt to quit: 02/22/2011   Smokeless tobacco: Never Used   Tobacco comment: 2 cigars monthly or less  Substance and Sexual Activity   Alcohol use: Yes     0.0 oz    Vodka, 2-3 drinks 3x a week   Drugs: No   Current Outpatient Medications on File Prior to Visit  Medication Sig Dispense Refill   atorvastatin (LIPITOR) 40 MG tablet TAKE ONE TABLET BY MOUTH ONCE DAILY (Patient taking differently: Take 40 mg by mouth at bedtime.) 48 tablet 1   Coenzyme Q10 (COQ10 PO) Take 1 capsule by mouth daily.  (Patient not taking: No sig reported)     Dulaglutide (TRULICITY) 1.5 JS/9.7WY SOPN INJECT 1.5 MG INTO THE SKIN ONCE A WEEK. (Patient taking differently: 1.5 mg once a week. Sunday's ;    INJECT 1.5 MG INTO THE SKIN ONCE A WEEK.) 2 mL 11   ELIQUIS 5 MG TABS tablet TAKE ONE TABLET BY MOUTH TWICE DAILY 60 tablet 5   glucose blood test strip 1 each by Other route daily. Use daily for glucose control - Dx  250.00 100 each 0   hydrochlorothiazide (MICROZIDE) 12.5 MG capsule TAKE ONE CAPSULE BY MOUTH DAILY 90 capsule 0   lisinopril (ZESTRIL) 40 MG tablet TAKE 1 AND 1/2 TABLETS BY MOUTH DAILY 135 tablet 1   metFORMIN (GLUCOPHAGE) 1000 MG tablet TAKE ONE TABLET BY MOUTH TWICE DAILY WITH FOOD (Patient taking differently: Take 2,000 mg by mouth at bedtime.) 180 tablet 1   nitrofurantoin, macrocrystal-monohydrate, (MACROBID) 100 MG capsule Take 1 capsule (100 mg total) by mouth at bedtime. 14 capsule 0   propranolol (INDERAL) 10 MG tablet Take 1 tablet (10 mg total) by mouth 4 (four) times daily as needed (palpitations, irregular  or fast heart rate). 30 tablet 9   sildenafil (REVATIO) 20 MG tablet TAKE 1 TO 2 TABLETS 2 TO 3 HOURS BEFORE SEX 30 tablet 5   No current facility-administered medications on file prior to visit.   No Known Allergies Family History  Problem Relation Age of Onset   Aortic aneurysm Other        family history   Cancer Other        Breast (1st degree relative)   Pancreatic cancer Mother 59   Colon cancer Neg Hx    Rectal cancer Neg Hx    Stomach cancer Neg Hx    Esophageal cancer Neg Hx     PE: There were no vitals taken for this visit. Wt Readings from Last 3 Encounters:  06/18/21 211 lb 3.2 oz (95.8 kg)  04/20/21 212 lb 1.6 oz (96.2 kg)  01/28/21 210 lb (95.3 kg)   Constitutional: overweight, in NAD Eyes: PERRLA, EOMI, no exophthalmos ENT: moist mucous membranes, no thyromegaly, no cervical lymphadenopathy Cardiovascular: RRR, No MRG Respiratory: CTA B Gastrointestinal: abdomen soft, NT, ND, BS+ Musculoskeletal: no deformities, strength intact in all 4 Skin: moist, warm, no rashes Neurological: no tremor with outstretched hands, DTR normal in all 4  ASSESSMENT: 1. DM2, insulin-independent, uncontrolled, with complications - PN - ED  2. HL  3.  Overweight  PLAN:  1. Patient with longstanding, previously uncontrolled, type 2 diabetes, on oral  antidiabetic regimen with metformin and also sulfonylurea as needed before a larger meal, and also weekly GLP-1 receptor agonist.  Sugars improved after moving metformin at night.  At last visit, he was not taking glipizide before large meals and I advised him to do so blood sugars were mostly at goal with only few hyperglycemic exceptions when he forgot his medicines.  We did not change his regimen at that time.  HbA1c was 6.5%, slightly higher, but still excellent.  He usually refills his Trulicity on a monthly basis due to price. -At today's visit, due to being busy, traveling, and also a new diagnosis of bladder cancer, his sugars have been slightly higher especially in the morning.  Many of these blood sugars are checked after later dinners.  He sometimes eats dinner at 8 PM or later.  He is not taking glipizide before these meals.  We discussed that he can take a low-dose glipizide before a larger or later dinner.  For now, I do not feel the rest of the regimen has to be changed but we did discuss about taking metformin with dinner as he sometimes takes it later, around bedtime. - I suggested to:  Patient Instructions  Please continue: - Trulicity 1.5 mg weekly. - Metformin 2000 mg with dinner  Try to use: - Glipizide 2.5 mg before a large or a late dinner  Please return in 4-6 months with your sugar log.   - we checked his HbA1c: 6.7% (only slightly higher) - advised to check sugars at different times of the day - 1x a day, rotating check times - advised for yearly eye exams >> he is UTD - return to clinic in 4-6 months  2.  HL -Reviewed latest lipid panel from 03/2021: All fractions at goal, excellent: Lab Results  Component Value Date   CHOL 100 04/20/2021   HDL 41.40 04/20/2021   LDLCALC 35 04/20/2021   LDLDIRECT 77.0 07/07/2015   TRIG 119.0 04/20/2021   CHOLHDL 2 04/20/2021  -He continues on Lipitor 40 mg daily and co-Q10.  No  side effects.  3.  Overweight -She lost  approximately 26 pounds immediately after starting the GLP-1 receptor agonist and after switching to a more plant-based diet -He gained 4 pounds afterwards -We will continue Trulicity which should also help with weight loss  [name], MD PhD [facility]

## 2021-08-03 NOTE — Patient Instructions (Addendum)
Please continue: - Trulicity 1.5 mg weekly. - Metformin 2000 mg with dinner  Try to use: - Glipizide 2.5 mg before a large or a late dinner  Please return in 4-6 months with your sugar log.

## 2021-08-04 DIAGNOSIS — Z5111 Encounter for antineoplastic chemotherapy: Secondary | ICD-10-CM | POA: Diagnosis not present

## 2021-08-04 DIAGNOSIS — C678 Malignant neoplasm of overlapping sites of bladder: Secondary | ICD-10-CM | POA: Diagnosis not present

## 2021-08-06 ENCOUNTER — Ambulatory Visit: Payer: Medicare HMO

## 2021-08-09 ENCOUNTER — Other Ambulatory Visit: Payer: Self-pay

## 2021-08-09 ENCOUNTER — Ambulatory Visit (INDEPENDENT_AMBULATORY_CARE_PROVIDER_SITE_OTHER): Payer: Medicare HMO | Admitting: *Deleted

## 2021-08-09 DIAGNOSIS — Z23 Encounter for immunization: Secondary | ICD-10-CM

## 2021-08-11 DIAGNOSIS — C678 Malignant neoplasm of overlapping sites of bladder: Secondary | ICD-10-CM | POA: Diagnosis not present

## 2021-08-11 DIAGNOSIS — Z5111 Encounter for antineoplastic chemotherapy: Secondary | ICD-10-CM | POA: Diagnosis not present

## 2021-08-13 ENCOUNTER — Telehealth: Payer: Self-pay | Admitting: Pharmacist

## 2021-08-13 ENCOUNTER — Other Ambulatory Visit: Payer: Self-pay | Admitting: Internal Medicine

## 2021-08-13 DIAGNOSIS — E785 Hyperlipidemia, unspecified: Secondary | ICD-10-CM

## 2021-08-13 NOTE — Chronic Care Management (AMB) (Signed)
Chronic Care Management Pharmacy Assistant   Name: Robert [name]  MRN: [id number] DOB: 03/27/1951  Reason for Encounter: Medication Review Medication Coordination   Recent office visits:  08/09/21 [name], CMA Patient presented to [facility] for Flu Vaccine  Recent consult visits:  Nahser, [name], MD (Cardiology) - Patient presented for A-fib. No medication changes  08/03/21 [name], MD - Patient presented for Type 2 diabetes mellitus without complication and other concerns. Prescribed Glipizide.  Hospital visits:  Medication Reconciliation was completed by comparing discharge summary, patient's EMR and Pharmacy list, and upon discussion with patient.   06-18-2021 [facility] - Patient presented for Transurethral resection of bladder tumor. Patient was there for 5 hours.   Medications that remain the same after Hospital Discharge:??  -All other medications will remain the same.      Medications: Outpatient Encounter Medications as of 08/13/2021  Medication Sig   atorvastatin (LIPITOR) 40 MG tablet TAKE ONE TABLET BY MOUTH ONCE DAILY   Coenzyme Q10 (COQ10 PO) Take 1 capsule by mouth daily.   Dulaglutide (TRULICITY) 1.5 IW/9.7LG SOPN INJECT 1.5 MG INTO THE SKIN ONCE A WEEK. (Patient taking differently: 1.5 mg once a week. Sunday's ;    INJECT 1.5 MG INTO THE SKIN ONCE A WEEK.)   ELIQUIS 5 MG TABS tablet TAKE ONE TABLET BY MOUTH TWICE DAILY   glipiZIDE (GLUCOTROL) 5 MG tablet Take 0.5 tablets (2.5 mg total) by mouth daily before supper.   glucose blood test strip 1 each by Other route daily. Use daily for glucose control - Dx 250.00   hydrochlorothiazide (MICROZIDE) 12.5 MG capsule TAKE ONE CAPSULE BY MOUTH DAILY   lisinopril (ZESTRIL) 40 MG tablet TAKE 1 AND 1/2 TABLETS BY MOUTH DAILY   metFORMIN (GLUCOPHAGE) 1000 MG tablet TAKE ONE TABLET BY MOUTH TWICE DAILY WITH FOOD (Patient taking differently: Take 2,000 mg by mouth at bedtime.)    nitrofurantoin, macrocrystal-monohydrate, (MACROBID) 100 MG capsule Take 1 capsule (100 mg total) by mouth at bedtime.   propranolol (INDERAL) 10 MG tablet Take 1 tablet (10 mg total) by mouth 4 (four) times daily as needed (palpitations, irregular or fast heart rate).   sildenafil (REVATIO) 20 MG tablet TAKE 1 TO 2 TABLETS 2 TO 3 HOURS BEFORE SEX   No facility-administered encounter medications on file as of 08/13/2021.   Reviewed chart for medication changes ahead of medication coordination call.  No OVs, Consults, or hospital visits since last care coordination call/Pharmacist visit.  Hospital visit listed above  Medication changes indicated : Patient prescribed Glipizide BP Readings from Last 3 Encounters:  08/03/21 136/74  06/18/21 139/75  04/20/21 116/70    Lab Results  Component Value Date   HGBA1C 6.7 (A) 08/03/2021     Patient obtains medications through Vials  30 Days   Last adherence delivery included:  Metformin HCL 1,000 mg: one tablet twice daily at bedtime Atorvastatin 40 mg: one tablet daily at bedtime Hydrochlorothiazide (MICROZIDE) 12.5 MG capsule: one tablet daily Lisinopril 40 mg: one and a half tablet daily (Patient states he is only taking one) *Trulicity due 92/11 pt to call me prior to be sure it coordinate fill week prior     Patient declined the following medications (meds) due to (reason) Eliquis 5 mg: one tablet twice daily (Abundance on hand has had to hold on it a few times for surgery will call)    Patient is due for next adherence delivery on: 08/26/21. Called patient  and reviewed medications and coordinated delivery. Confirmed vials for 30 DS  This delivery to include: Lisinopril 40 mg: one and a half tablet daily (Patient states he is only taking one)CPP AWARE  Metformin HCL 1,000 mg: one tablet twice daily at bedtime Atorvastatin 40 mg: one tablet daily at bedtime Hydrochlorothiazide (MICROZIDE) 12.5 MG capsule: one tablet  daily   Confirmed delivery date of 08/26/21, advised patient that pharmacy will contact him the morning of delivery.   Reviewed chart prior to disease state call. Spoke with patient regarding BP  Recent Office Vitals: BP Readings from Last 3 Encounters:  08/17/21 138/76  08/03/21 136/74  06/18/21 139/75   Pulse Readings from Last 3 Encounters:  08/17/21 73  08/03/21 75  06/18/21 61    Wt Readings from Last 3 Encounters:  08/17/21 209 lb 3.2 oz (94.9 kg)  08/03/21 209 lb 12.8 oz (95.2 kg)  06/18/21 211 lb 3.2 oz (95.8 kg)     Kidney Function Lab Results  Component Value Date/Time   CREATININE 0.70 06/18/2021 09:04 AM   CREATININE 0.77 04/20/2021 11:02 AM   GFR 90.61 04/20/2021 11:02 AM   GFRNONAA 88 10/21/2019 11:38 AM   GFRAA 101 10/21/2019 11:38 AM    BMP Latest Ref Rng & Units 06/18/2021 04/20/2021 01/28/2021  Glucose 70 - 99 mg/dL 151(H) 116(H) 134(H)  BUN 8 - 23 mg/dL 14 10 9   Creatinine 0.61 - 1.24 mg/dL 0.70 0.77 0.85  BUN/Creat Ratio 10 - 24 - - -  Sodium 135 - 145 mmol/L 137 134(L) 134(L)  Potassium 3.5 - 5.1 mmol/L 4.2 4.0 4.6  Chloride 98 - 111 mmol/L 100 98 99  CO2 19 - 32 mEq/L - 28 30  Calcium 8.4 - 10.5 mg/dL - 9.4 9.6    Current antihypertensive regimen:  Lisinopril 40 mg: one and a half tablet daily (Patient states he is only taking one) Current home BP readings: Cardiologist office reading on today was 138/76 What recent interventions/DTPs have been made by any provider to improve Blood Pressure control since last CPP Visit: Patient reports no changes Any recent hospitalizations or ED visits since last visit with CPP? None  Adherence Review: Is the patient currently on ACE/ARB medication? Yes Does the patient have >5 day gap between last estimated fill dates? No  Care Gaps: BP 136/74 A1C- 6.7 AWV- 6/22 CCM- 11/22 Hepatitis C Screening - Overdue Zoster Vacine - Overdue Foot Exam - Overdue COVID Vaccine #4 [employer]) - Overdue  Star Rating  Drugs: Atorvastatin 40 mg - Last filled 07/21/2021 [facility] Metformin 1000 mg - Last filled 07/21/2021 30 [facility] Lisinopril 40 mg - Last filled - 07/21/2021 30 [facility] Duaglutide 1.5 mg -Last filled 08/11/21 28 DS at  [facility] Glipizide (Glucotrol) 5 mg - No fill history was prescribed 08/03/21    [place] Pharmacist Assistant [phone]

## 2021-08-16 ENCOUNTER — Encounter: Payer: Self-pay | Admitting: Cardiovascular Disease

## 2021-08-16 NOTE — Progress Notes (Signed)
Cardiology Office Note:    Date:  08/19/2021   ID:  ELDRA, WORD 1951/05/17, MRN [id number]  PCP:  Isaac Bliss, Rayford Halsted, MD  Cardiologist:  Mertie Moores, MD   Referring MD: Isaac Bliss, [place]*   Problem list 1.  Atrial fibrillation 2.  Hyperlipidemia 3.  Diabetes mellitus  Chief Complaint  Patient presents with   Atrial Fibrillation          11/06/17    Robert [name] is a 70 y.o. male with a hx of palpitations for the past several months.  They started the Monday before Thanksgiving. In the middle of the night to go the bathroom.  Felt some pounding in his ears. Counted his heart rate in the 140s.  Also verified his Hr  with his blood pressure cuff. The arrhythmia had resolved by the next morning. He has not had any dizziness/syncope or presyncope.  He exercises regularly.  He swims at the gym 5 days a week.  He is swimming speech seems to be about the same as before he was diagnosed with atrial fibrillation.  He had another episode 2 days later.  He went to the emergency room down in [facility] but was back in normal rhythm at that time.   He returned and saw his primary medical doctor and an event monitor was placed.  We found atrial fibrillation with a slightly rapid ventricular response.  He was called into the office for further evaluation and management.  Episodes seem to be at night Dr. Sherren Mocha started Atenolol   Is retiured now,  [facility] .    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  Claudication-like symptoms.  Has seen [facility] Oneida Alar  [facility] and his arterial flow is found to be near normal in both legs. . He has not been diagnosed with sleep apnea but his wife states that he snores.  April 19, 2018:   Robert [name]   is seen back today for follow-up of his paroxysmal atrial fib, hyperlipidemia, hypertension, diabetes mellitus.  He is basically asymptomatic.  He cannot tell any difference between when he is  in atrial fibrillation or in sinus rhythm.  He will occasionally feel his pulse and feel that it is irregular.  He typically will take a propranolol tablet with conversion back to sinus rhythm in less than an hour. Still exercising at the [facility] 5 days a week. Checked out sleep studies,   Was going to cost $4,000.  Diabetes is better controlled on Trulicity.   May 25, 2020:   Robert [name] is seen back today for a follow-up visit.  He has a history of paroxysmal atrial fibrillation, hyperlipidemia, diabetes mellitus. Has notice more Afib this past week.  Has been using his propranolol more  Has been under more stress  has changed is diet to a more plant based diet Trigs have improved.   Oc.t 25, 2022: Robert [name] is seen back today for follow up visit . 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 Has had hematuria while on eliquis  Dr. Junious Silk and  I have worked out a Equities trader that he stops his Eliquis 3-4 days pror to his BCG  Then restart the Eliquis on the evening of his BCG. Has 6 more treatments.      Past Medical History:  Diagnosis Date   Anticoagulant long-term use    eliquis--- managed by cardiology   Bladder neoplasm    Diabetes mellitus type [name]    endocrinologist--- [name] --  (06-16-2021 per pt check blood sugar 1-2 times daily,  fasting sugar-- 118-128)   History of basal cell carcinoma (BCC) excision    History of COVID-19 05/2019   per pt mild symptoms that resolved   HLD (hyperlipidemia)    HTN (hypertension)    Hx of colonic polyps    Nocturia    Paroxysmal atrial fibrillation ([place]) 03/12/2019   cardiologist--- [name]   Ureterocele      Past Surgical History:  Procedure Laterality Date   BLEPHAROPLASTY Bilateral    per pt approx  2001;   upper eyelid's   COLONOSCOPY     last one approx 2020   PATELLAR TENDON REPAIR Left 04/16/2004   @[facility]    TRANSURETHRAL RESECTION OF BLADDER TUMOR N/A 06/18/2021   Procedure: TRANSURETHRAL RESECTION OF BLADDER TUMOR (TURBT) WITH RIGHT URETERAL STENT PLACEMENT, RIGHT URETEROSCOPY WITH DISTRACTION OF TUMOR, FULGURATION;  Surgeon: Festus Aloe, MD;  Location: [facility];  Service: Urology;  Laterality: N/A;    Current Medications: Current Meds  Medication Sig   atorvastatin (LIPITOR) 40 MG tablet TAKE ONE TABLET BY MOUTH ONCE DAILY   Dulaglutide (TRULICITY) 1.5 VO/3.5KK SOPN INJECT 1.5 MG INTO THE SKIN ONCE A WEEK.   ELIQUIS 5 MG TABS tablet TAKE ONE TABLET BY MOUTH TWICE DAILY   glipiZIDE (GLUCOTROL) 5 MG tablet Take 0.5 tablets (2.5 mg total) by mouth daily before supper.   glucose blood test strip 1 each by Other route daily. Use daily for glucose control - Dx 250.00   hydrochlorothiazide (MICROZIDE) 12.5 MG capsule TAKE ONE CAPSULE BY MOUTH DAILY   lisinopril (ZESTRIL) 40 MG tablet TAKE 1 AND 1/2 TABLETS BY MOUTH DAILY   metFORMIN (GLUCOPHAGE) 1000 MG tablet TAKE ONE TABLET BY MOUTH TWICE DAILY WITH FOOD   metoprolol succinate (TOPROL-XL) 25 MG 24 hr tablet Take 25 mg by mouth daily.   propranolol (INDERAL) 10 MG tablet Take 1 tablet (10 mg total) by mouth 4 (four) times daily as needed (palpitations, irregular or fast heart rate).   sildenafil (REVATIO) 20 MG tablet TAKE 1 TO 2 TABLETS 2 TO 3 HOURS BEFORE SEX     Allergies:   Patient has no known allergies.   Social History   Socioeconomic History   Marital status: Married    Spouse name: Not on file   Number of children: Not on file   Years of education: Not on file   Highest education level: Not on file  Occupational History   Occupation: DVD's   Occupation: Cabin crew: Self  Employed  Tobacco Use   Smoking status: Former    Types: Cigars    Quit date: 02/22/2011    Years since quitting: 10.4   Smokeless tobacco: Never  Vaping Use   Vaping Use: Never used  Substance and Sexual Activity   Alcohol use: Yes    Alcohol/week: 0.0 standard drinks    Comment: 6-8oz /wk   Drug use: Never   Sexual activity: Not on file  Other Topics Concern   Not on  file  Social History Narrative   Patient is married he has been employed in Radiographer, therapeutic   Former smoker with occasional alcohol use   Social Determinants of Radio broadcast assistant Strain: Low Risk    Difficulty of Paying Living Expenses: Not hard at all  Food Insecurity: Not on file  Transportation Needs: No Transportation Needs   Lack of Transportation (Medical): No   Lack of Transportation (Non-Medical): No  Physical Activity: Not on file  Stress: Not on file  Social Connections: Not on file     Family History: The patient's family history includes Aortic aneurysm in an other family member; Cancer in an other family member; Pancreatic cancer (age of onset: 32) in his mother. There is no history of Colon cancer, Rectal cancer, Stomach cancer, or Esophageal cancer.  ROS:   Please see the history of present illness.     All other systems reviewed and are negative.  EKGs/Labs/Other Studies Reviewed:    The following studies were reviewed today:   EKG:   August 17, 2021: Normal sinus rhythm.  He has biphasic T waves which are unchanged from previous EKGs.   Recent Labs: 04/20/2021: ALT 15; Platelets 177.0; TSH 1.18 06/18/2021: BUN 14; Creatinine, Ser 0.70; Hemoglobin 16.0; Potassium 4.2; Sodium 137  Recent Lipid Panel    Component Value Date/Time   CHOL 100 04/20/2021 1102   TRIG 119.0 04/20/2021 1102   HDL 41.40 04/20/2021 1102   CHOLHDL 2 04/20/2021 1102   VLDL 23.8 04/20/2021 1102   LDLCALC 35 04/20/2021 1102   LDLDIRECT 77.0 07/07/2015 0930    Physical Exam:     Physical  Exam: Blood pressure 138/76, pulse 73, height 6\' 3"  (1.905 m), weight 209 lb 3.2 oz (94.9 kg), SpO2 97 %.  GEN:  Well nourished, well developed in no acute distress HEENT: Normal NECK: No JVD; No carotid bruits LYMPHATICS: No lymphadenopathy CARDIAC: RRR   RESPIRATORY:  Clear to auscultation without rales, wheezing or rhonchi  ABDOMEN: Soft, non-tender, non-distended MUSCULOSKELETAL:  No edema; No deformity  SKIN: Warm and dry NEUROLOGIC:  Alert and oriented x 3    ASSESSMENT:    1. PAF (paroxysmal atrial fibrillation) ([place])   2. Essential hypertension     PLAN:     Paroxysmal atrial fibrillation:   CHADS2VASC  = 3  ( age 66, DM, HTN)  He is maintaining sinus rhythm.  He takes propranolol on an as-needed basis if he has any breakthrough episodes of atrial fibrillation.  Seems to be tolerating well.  He is on Eliquis.  He is also being treated with BCG treatments for bladder tumor.  The Eliquis has caused some mild hematuria.  In order for him to receive the BCG treatment he must not have any hematuria.  We developed a strategy through Dr. Junious Silk that he holds his Eliquis 3 to 4 days prior to his BCG treatment to allow his urine to clear.  This seems to be working fairly well.  2.  HTN:     Blood pressures well controlled.    Medication Adjustments/Labs and Tests Ordered: Current medicines are reviewed at length with the patient today.  Concerns regarding medicines are outlined above.  Orders Placed This Encounter  Procedures   EKG 12-Lead    No orders of the defined types were placed in this encounter.   Signed, Mertie Moores, MD  08/19/2021 9:38 PM    [facility]

## 2021-08-17 ENCOUNTER — Ambulatory Visit: Payer: Medicare HMO | Admitting: Cardiovascular Disease

## 2021-08-17 ENCOUNTER — Other Ambulatory Visit: Payer: Self-pay

## 2021-08-17 ENCOUNTER — Encounter: Payer: Self-pay | Admitting: Cardiovascular Disease

## 2021-08-17 VITALS — BP 138/76 | HR 73 | Ht 75.0 in | Wt 209.2 lb

## 2021-08-17 DIAGNOSIS — I480 Paroxysmal atrial fibrillation: Secondary | ICD-10-CM | POA: Diagnosis not present

## 2021-08-17 DIAGNOSIS — I10 Essential (primary) hypertension: Secondary | ICD-10-CM

## 2021-08-17 NOTE — Patient Instructions (Signed)
Medication Instructions:  Your physician recommends that you continue on your current medications as directed. Please refer to the Current Medication list given to you today.  *If you need a refill on your cardiac medications before your next appointment, please call your pharmacy*   Lab Work: NONE If you have labs (blood work) drawn today and your tests are completely normal, you will receive your results only by: [place] (if you have MyChart) OR A paper copy in the mail If you have any lab test that is abnormal or we need to change your treatment, we will call you to review the results.   Testing/Procedures: NONE   Follow-Up: At [facility], you and your health needs are our priority.  As part of our continuing mission to provide you with exceptional heart care, we have created designated Provider Care Teams.  These Care Teams include your primary Cardiologist (physician) and Advanced Practice Providers (APPs -  Physician Assistants and Nurse Practitioners) who all work together to provide you with the care you need, when you need it.   Your next appointment:   1 year(s)  The format for your next appointment:   In Person  Provider:   You may see Mertie Moores, MD or one of the following Advanced Practice Providers on your designated Care Team:   Richardson Dopp, PA-C [place], [place]

## 2021-08-18 DIAGNOSIS — R8271 Bacteriuria: Secondary | ICD-10-CM | POA: Diagnosis not present

## 2021-08-18 DIAGNOSIS — C678 Malignant neoplasm of overlapping sites of bladder: Secondary | ICD-10-CM | POA: Diagnosis not present

## 2021-08-23 ENCOUNTER — Telehealth: Payer: Self-pay | Admitting: Pharmacist

## 2021-08-23 NOTE — Chronic Care Management (AMB) (Signed)
    Chronic Care Management Pharmacy Assistant   Name: Robert [name]  MRN: [id number] DOB: 1951-07-11  08/23/21 APPOINTMENT REMINDER  Patient was reminded to have all medications, supplements and any blood glucose and blood pressure readings available for review with Jeni Salles, Pharm. D, for telephone visit on 08/24/21 at 1.   Questions: Have you had any recent office visit or specialist visit outside of [place]? Pt reports no Are there any concerns you would like to discuss during your office visit? Pt reports no he has been doing pretty good  Care Gaps: BP 136/74 A1C- 6.7 AWV- 6/22 CCM- 11/22 Hepatitis C Screening - Overdue Zoster Vacine - Overdue Foot Exam - Overdue COVID Vaccine #4 [employer]) - Overdue   Star Rating Drugs: Atorvastatin 40 mg - Last filled 07/21/2021 30 [facility] Metformin 1000 mg - Last filled 07/21/2021 30 [facility] Lisinopril 40 mg - Last filled - 07/21/2021 30 [facility] Duaglutide 1.5 mg -Last filled 08/11/21 28 DS [facility] Glipizide (Glucotrol) 5 mg - No fill history was prescribed 08/03/21    Any gaps in medications fill history? Glipizide (Glucotrol) 5 mg - Call to [facility] verified no fill history as accurate/ no hx in [place]   Medications: Outpatient Encounter Medications as of 08/23/2021  Medication Sig   atorvastatin (LIPITOR) 40 MG tablet TAKE ONE TABLET BY MOUTH ONCE DAILY   Coenzyme Q10 (COQ10 PO) Take 1 capsule by mouth daily. (Patient not taking: Reported on 08/17/2021)   Dulaglutide (TRULICITY) 1.5 UQ/3.3HL SOPN INJECT 1.5 MG INTO THE SKIN ONCE A WEEK.   ELIQUIS 5 MG TABS tablet TAKE ONE TABLET BY MOUTH TWICE DAILY   glipiZIDE (GLUCOTROL) 5 MG tablet Take 0.5 tablets (2.5 mg total) by mouth daily before supper.   glucose blood test strip 1 each by Other route daily. Use daily for glucose control - Dx 250.00   hydrochlorothiazide (MICROZIDE) 12.5 MG capsule TAKE ONE CAPSULE BY MOUTH DAILY    lisinopril (ZESTRIL) 40 MG tablet TAKE 1 AND 1/2 TABLETS BY MOUTH DAILY   metFORMIN (GLUCOPHAGE) 1000 MG tablet TAKE ONE TABLET BY MOUTH TWICE DAILY WITH FOOD   metoprolol succinate (TOPROL-XL) 25 MG 24 hr tablet Take 25 mg by mouth daily.   nitrofurantoin, macrocrystal-monohydrate, (MACROBID) 100 MG capsule Take 1 capsule (100 mg total) by mouth at bedtime. (Patient not taking: Reported on 08/17/2021)   propranolol (INDERAL) 10 MG tablet Take 1 tablet (10 mg total) by mouth 4 (four) times daily as needed (palpitations, irregular or fast heart rate).   sildenafil (REVATIO) 20 MG tablet TAKE 1 TO 2 TABLETS 2 TO 3 HOURS BEFORE SEX   No facility-administered encounter medications on file as of 08/23/2021.   [place] [facility] Pharmacist Assistant [phone]

## 2021-08-24 ENCOUNTER — Ambulatory Visit: Payer: Medicare HMO | Admitting: Pharmacist

## 2021-08-24 DIAGNOSIS — E119 Type 2 diabetes mellitus without complications: Secondary | ICD-10-CM

## 2021-08-24 DIAGNOSIS — I10 Essential (primary) hypertension: Secondary | ICD-10-CM

## 2021-08-24 NOTE — Patient Instructions (Addendum)
Hi Robert [name],  It was great to catch up with you again! Don't forget to look into getting your shingles shot and next COVID booster as soon as you can.  Please reach out to me if you have any questions or need anything before our follow up!  Best, Robert [name]  Jeni Salles, PharmD, [place] Pharmacist [place] [facility] [place]   Visit Information   Goals Addressed   None    Patient Care Plan: CCM Pharmacy Care Plan     Problem Identified: Problem: Hypertension, Hyperlipidemia, Diabetes, and Atrial Fibrillation      Long-Range Goal: Patient-Specific Goal   Start Date: 08/24/2021  Expected End Date: 08/24/2022  This Visit's Progress: On track  Priority: High  Note:   Current Barriers:  Unable to independently monitor therapeutic efficacy Does not adhere to prescribed medication regimen  Pharmacist Clinical Goal(s):  Patient will achieve adherence to monitoring guidelines and medication adherence to achieve therapeutic efficacy through collaboration with PharmD and provider.   Interventions: 1:1 collaboration with Isaac Bliss, Rayford Halsted, MD regarding development and update of comprehensive plan of care as evidenced by provider attestation and co-signature Inter-disciplinary care team collaboration (see longitudinal plan of care) Comprehensive medication review performed; medication list updated in electronic medical record  Hypertension (BP goal <140/90) -Controlled -Current treatment: Lisinopril 40 mg 1.5 tablets daily  - taking 1 tablet daily Metoprolol succinate 25 mg 1 tablet daily HCTZ 12.5 mg 1 capsule daily -Medications previously tried:  n/a -Current home readings: 120/74, 124/76 (checking with arm cuff a few times a week) -Current dietary habits: plant based diet -Current exercise habits: did not discuss -Denies hypotensive/hypertensive symptoms -Educated on BP goals and benefits of medications for prevention of heart attack, stroke and  kidney damage; Exercise goal of 150 minutes per week; Importance of home blood pressure monitoring; Proper BP monitoring technique; -Counseled to monitor BP at home weekly, document, and provide log at future appointments -Counseled on diet and exercise extensively Recommended to continue current medication Requested updated lisinopril directions.  Hyperlipidemia: (LDL goal < 70) -Controlled -Current treatment: Atorvastatin 40 mg 1 tablet daily -Medications previously tried: none  -Current dietary patterns: plant based diet; limits fried foods -Current exercise habits: did not discuss -Educated on Cholesterol goals;  Benefits of statin for ASCVD risk reduction; -Counseled on diet and exercise extensively Recommended to continue current medication  Diabetes (A1c goal <7%) -Controlled -Current medications: Glipizide 5 mg 1/2 tablet as needed for large meals Trulicity 1.5 mg inject once weekly Metformin 1000 mg 1 tablet twice daily with food -Medications previously tried: n/a  -Current home glucose readings fasting glucose: 110-174 (after meals), 99 before bedtime  post prandial glucose: n/a -Denies hypoglycemic/hyperglycemic symptoms -Current meal patterns:  breakfast: did not discuss  lunch: did not discuss   dinner: did not discuss  snacks: did not discuss  drinks: did not discuss  -Current exercise: did not discuss -Educated on A1c and blood sugar goals; Benefits of routine self-monitoring of blood sugar; Carbohydrate counting and/or plate method -Counseled to check feet daily and get yearly eye exams -Counseled on diet and exercise extensively Recommended to continue current medication  Atrial Fibrillation (Goal: prevent stroke and major bleeding) -Controlled -CHADSVASC: 3 -Current treatment: Rate control: metoprolol succinate 25 mg daily; propranolol 10 mg 1 tablet 4 times daily as needed Anticoagulation: Eliquis 5 mg 1 tablet twice daily -Medications previously  tried: diltiazem -Home BP and HR readings: refer to above; HR 69-74   -Counseled on increased risk of  stroke due to Afib and benefits of anticoagulation for stroke prevention; avoidance of NSAIDs due to increased bleeding risk with anticoagulants; -Recommended to continue current medication  Health Maintenance -Vaccine gaps: shingrix, COVID booster -Current therapy:  Multivitamin daily Vitamin B complex daily Sildenafil as needed -Educated on Cost vs benefit of each product must be carefully weighed by individual consumer -Patient is satisfied with current therapy and denies issues -Recommended to continue current medication  Patient Goals/Self-Care Activities Patient will:  - take medications as prescribed as evidenced by patient report and record review check glucose a few times a week, document, and provide at future appointments check blood pressure daily, document, and provide at future appointments target a minimum of 150 minutes of moderate intensity exercise weekly  Follow Up Plan: Telephone follow up appointment with care management team member scheduled for: 1 year       Patient verbalizes understanding of instructions provided today and agrees to view in [place].  Telephone follow up appointment with pharmacy team member scheduled for: 1 year  Robert [name], [facility]

## 2021-08-24 NOTE — Progress Notes (Signed)
Chronic Care Management Pharmacy Note  08/24/2021 Name:  Robert [name] MRN:  [id number] DOB:  [birth year]  Summary: A1c at goal < 7% LDL at goal < 70   Recommendations/Changes made from today's visit: -Recommend annual low dose CT scan for smoking history -Requested updated directions for lisinopril dose -Recommended checking blood pressure after taking medications  Plan: BP assessment in 3-4 months   Subjective: Robert [name] is an 70 y.o. year old male who is a primary patient of Isaac Bliss, Rayford Halsted, MD.  The CCM team was consulted for assistance with disease management and care coordination needs.    Engaged with patient by telephone for follow up visit in response to provider referral for pharmacy case management and/or care coordination services.   Consent to Services:  The patient was given information about Chronic Care Management services, agreed to services, and gave verbal consent prior to initiation of services.  Please see initial visit note for detailed documentation.   Patient Care Team: Isaac Bliss, Rayford Halsted, MD as PCP - General (Internal Medicine) Nahser, [name], MD as PCP - Cardiology (Cardiology) Viona Gilmore, [facility] as Pharmacist (Pharmacist)  Recent office visits: 08/09/21 [name], CMA Patient presented to [facility] for Flu Vaccine.  04/20/21 Domingo Mend, MD: Patient presented for annual exam.  Recent consult visits: 08/17/21 Mertie Moores, MD (cardiology): Patient presented for Afib follow up.    08/03/21 [name], MD - Patient presented for Type 2 diabetes mellitus without complication and other concerns. Prescribed Glipizide 2.5 mg before a large or a late dinner.  05/17/21 Festus Aloe, MD (urology): Patient presented for follow up for neoplasm of bladder. Unable to access notes.  Hospital visits: Medication Reconciliation was completed by comparing discharge summary, patient's EMR and  Pharmacy list, and upon discussion with patient.   06-18-2021 [place] - Patient presented for Transurethral resection of bladder tumor. Patient was there for 5 hours.   Medications that remain the same after Hospital Discharge:??  -All other medications will remain the same.    Medication Reconciliation was completed by comparing discharge summary, patient's EMR and Pharmacy list, and upon discussion with patient.   Patient was  seen at [facility] on 04/18/21 due to hematuria.    New?Medications Started at Urgent care Discharge:?? -started  Ceftriaxone (ROCEPHIN) injection 500 mg; 500 mg, Intramuscular, Once, Indications: Urinary Tract Infection- in office Cephalexin (KEFLEX) 500 mg capsule; Take one capsule (500 mg dose) by mouth 2 (two) times daily for 7 days.  Objective:  Lab Results  Component Value Date   CREATININE 0.70 06/18/2021   BUN 14 06/18/2021   GFR 90.61 04/20/2021   GFRNONAA 88 10/21/2019   GFRAA 101 10/21/2019   NA 137 06/18/2021   K 4.2 06/18/2021   CALCIUM 9.4 04/20/2021   CO2 28 04/20/2021   GLUCOSE 151 (H) 06/18/2021    Lab Results  Component Value Date/Time   HGBA1C 6.7 (A) 08/03/2021 09:31 AM   HGBA1C 6.5 (A) 01/28/2021 10:05 AM   HGBA1C 7.6 (H) 10/04/2017 12:06 PM   HGBA1C 7.7 (H) 07/05/2017 08:34 AM   GFR 90.61 04/20/2021 11:02 AM   GFR 88.09 01/28/2021 09:25 AM   MICROALBUR 4.0 (H) 01/28/2021 09:25 AM   MICROALBUR 0.9 07/23/2018 12:20 PM    Last diabetic Eye exam:  Lab Results  Component Value Date/Time   HMDIABEYEEXA No Retinopathy 11/20/2020 12:00 AM    Last diabetic Foot exam:  Lab Results  Component Value Date/Time   HMDIABFOOTEX Done 03/31/2010 12:00 AM     Lab Results  Component Value Date   CHOL 100 04/20/2021   HDL 41.40 04/20/2021   LDLCALC 35 04/20/2021   LDLDIRECT 77.0 07/07/2015   TRIG 119.0 04/20/2021   CHOLHDL 2 04/20/2021    Hepatic Function Latest Ref Rng & Units 04/20/2021  01/28/2021 02/06/2020  Total Protein 6.0 - 8.3 g/dL 6.6 6.9 6.6  Albumin 3.5 - 5.2 g/dL 4.4 4.4 4.4  AST 0 - 37 U/L _0 ALT 0 - 53 U/L _1 Alk Phosphatase 39 - 117 U/L 72 60 68  Total Bilirubin 0.2 - 1.2 mg/dL 0.6 0.8 0.7  Bilirubin, Direct 0.0 - 0.3 mg/dL - - -    Lab Results  Component Value Date/Time   TSH 1.18 04/20/2021 11:02 AM   TSH 1.08 02/06/2020 09:56 AM    CBC Latest Ref Rng & Units 06/18/2021 04/20/2021 02/06/2020  WBC 4.0 - 10.5 K/uL - 9.4 10.7(H)  Hemoglobin 13.0 - 17.0 g/dL 16.0 15.9 16.6  Hematocrit 39.0 - 52.0 % 47.0 46.0 47.8  Platelets 150.0 - 400.0 K/uL - 177.0 194.0    Lab Results  Component Value Date/Time   VD25OH 37.23 04/20/2021 11:02 AM   VD25OH 35.73 02/06/2020 09:56 AM    Clinical ASCVD: No  The ASCVD Risk score (Arnett DK, et al., 2019) failed to calculate for the following reasons:   The valid total cholesterol range is 130 to 320 mg/dL    Depression screen [facility] 2/9 04/20/2021 02/06/2020 07/23/2018  Decreased Interest - 2 0  Down, Depressed, Hopeless 0 2 0  PHQ - 2 Score 0 4 0  Altered sleeping 0 0 -  Tired, decreased energy 0 1 -  Change in appetite 0 0 -  Feeling bad or failure about yourself  0 0 -  Trouble concentrating 0 0 -  Moving slowly or fidgety/restless 0 0 -  Suicidal thoughts 0 0 -  PHQ-9 Score 0 5 -  Difficult doing work/chores Not difficult at all Not difficult at all -    CHA2DS2/VAS Stroke Risk Points  Current as of about an hour ago     3 >= 2 Points: High Risk  1 - 1.99 Points: Medium Risk  0 Points: Low Risk    Last Change: N/A      Details    This score determines the patient's risk of having a stroke if the  patient has atrial fibrillation.       Points Metrics  0 Has Congestive Heart Failure:  No    Current as of about an hour ago  0 Has Vascular Disease:  No    Current as of about an hour ago  1 Has Hypertension:  Yes    Current as of about an hour ago  1 Age:  22    Current as of about an hour  ago  1 Has Diabetes:  Yes    Current as of about an hour ago  0 Had Stroke:  No  Had TIA:  No  Had Thromboembolism:  No    Current as of about an hour ago  0 Male:  No    Current as of about an hour ago      Social History   Tobacco Use  Smoking Status Former   Types: Cigars   Quit date: 02/22/2011   Years since quitting: 10.5  Smokeless Tobacco Never   BP Readings  from Last 3 Encounters:  08/17/21 138/76  08/03/21 136/74  06/18/21 139/75   Pulse Readings from Last 3 Encounters:  08/17/21 73  08/03/21 75  06/18/21 61   Wt Readings from Last 3 Encounters:  08/17/21 209 lb 3.2 oz (94.9 kg)  08/03/21 209 lb 12.8 oz (95.2 kg)  06/18/21 211 lb 3.2 oz (95.8 kg)   BMI Readings from Last 3 Encounters:  08/17/21 26.15 kg/m  08/03/21 26.22 kg/m  06/18/21 26.40 kg/m    Assessment/Interventions: Review of patient past medical history, allergies, medications, health status, including review of consultants reports, laboratory and other test data, was performed as part of comprehensive evaluation and provision of chronic care management services.   SDOH:  (Social Determinants of Health) assessments and interventions performed: No  SDOH Screenings   Alcohol Screen: Not on file  Depression (PHQ2-9): Low Risk    PHQ-2 Score: 0  Financial Resource Strain: Low Risk    Difficulty of Paying Living Expenses: Not hard at all  Food Insecurity: Not on file  Housing: Not on file  Physical Activity: Not on file  Social Connections: Not on file  Stress: Not on file  Tobacco Use: Medium Risk   Smoking Tobacco Use: Former   Smokeless Tobacco Use: Never   Passive Exposure: Not on file  Transportation Needs: No Transportation Needs   Lack of Transportation (Medical): No   Lack of Transportation (Non-Medical): No    CCM Care Plan  No Known Allergies  Medications Reviewed Today     Reviewed by [name] Ito, [place] (Certified Medical Assistant) on 08/17/21 at [phone]  Med List  Status: <None>   Medication Order Taking? Sig Documenting Provider Last Dose Status Informant  atorvastatin (LIPITOR) 40 MG tablet [id number] Yes TAKE ONE TABLET BY MOUTH ONCE DAILY Isaac Bliss, Rayford Halsted, MD Taking Active   Coenzyme Q10 (COQ10 PO) [id number] No Take 1 capsule by mouth daily.  Patient not taking: Reported on 08/17/2021   [provider] Not Taking Active   Dulaglutide (TRULICITY) 1.5 EK/8.0KL SOPN [id number] Yes INJECT 1.5 MG INTO THE SKIN ONCE A WEEK. [name], MD Taking Active   ELIQUIS 5 MG TABS tablet [id number] Yes TAKE ONE TABLET BY MOUTH TWICE DAILY Nahser, [name], MD Taking Active   glipiZIDE (GLUCOTROL) 5 MG tablet [id number] Yes Take 0.5 tablets (2.5 mg total) by mouth daily before supper. [name], MD Taking Active   glucose blood test strip [id number] Yes 1 each by Other route daily. Use daily for glucose control - Dx 250.00 Isaac Bliss, Rayford Halsted, MD Taking Active   hydrochlorothiazide (MICROZIDE) 12.5 MG capsule [id number] Yes TAKE ONE CAPSULE BY MOUTH DAILY Isaac Bliss, Rayford Halsted, MD Taking Active   lisinopril (ZESTRIL) 40 MG tablet [id number] Yes TAKE 1 AND 1/2 TABLETS BY MOUTH DAILY Isaac Bliss, Rayford Halsted, MD Taking Active   metFORMIN (GLUCOPHAGE) 1000 MG tablet [id number] Yes TAKE ONE TABLET BY MOUTH TWICE DAILY WITH FOOD Isaac Bliss, Rayford Halsted, MD Taking Active   metoprolol succinate (TOPROL-XL) 25 MG 24 hr tablet [id number] Yes Take 25 mg by mouth daily. [provider] Taking Active   nitrofurantoin, macrocrystal-monohydrate, (MACROBID) 100 MG capsule [id number] No Take 1 capsule (100 mg total) by mouth at bedtime.  Patient not taking: Reported on 08/17/2021   Festus Aloe, MD Not Taking Active   propranolol (INDERAL) 10 MG tablet [id number] Yes Take 1 tablet (10 mg total) by mouth 4 (four) times daily as needed (palpitations, irregular or fast heart rate).  Nahser, [name], MD Taking Active   sildenafil  (REVATIO) 20 MG tablet [id number] Yes TAKE 1 TO 2 TABLETS 2 TO 3 HOURS BEFORE SEX Isaac Bliss, Rayford Halsted, MD Taking Active             Patient Active Problem List   Diagnosis Date Noted   Long term current use of anticoagulant -Eliquis 03/12/2019   Paroxysmal atrial fibrillation ([place]) 03/12/2019   Overweight (BMI 25.0-29.9) 01/03/2019   Type 2 diabetes mellitus without complication, without long-term current use of insulin ([place]) 07/23/2018   BPH associated with nocturia 03/01/2017   Snoring 03/12/2013   Irregular heart rate 06/23/2011   Hx of colonic polyps    ERECTILE DYSFUNCTION 12/19/2008   SKIN CANCER, HX OF 12/19/2008   Hyperlipidemia 08/21/2007   Essential hypertension 08/21/2007    Immunization History  Administered Date(s) Administered   Fluad Quad(high Dose 65+) 07/02/2019, 08/07/2020, 08/09/2021   Influenza Split 09/26/2011, 11/26/2012   Influenza Whole 08/21/2007   Influenza, High Dose Seasonal PF 07/10/2017, 07/23/2018   Influenza,inj,Quad PF,6+ Mos 08/06/2013, 07/28/2015   PFIZER(Purple Top)SARS-COV-2 Vaccination 01/24/2020, 02/17/2020, 09/26/2020   Pneumococcal Conjugate-13 03/01/2017   Pneumococcal Polysaccharide-23 07/23/2018   Td 08/21/2007   Zoster, Live 02/05/2013    Conditions to be addressed/monitored:  Hypertension, Hyperlipidemia, Diabetes, and Atrial Fibrillation  Conditions addressed this visit: Diabetes, hypertension  Care Plan : [place]  Updates made by Viona Gilmore, [place] since 08/24/2021 12:00 AM     Problem: Problem: Hypertension, Hyperlipidemia, Diabetes, and Atrial Fibrillation      Long-Range Goal: Patient-Specific Goal   Start Date: 08/24/2021  Expected End Date: 08/24/2022  This Visit's Progress: On track  Priority: High  Note:   Current Barriers:  Unable to independently monitor therapeutic efficacy Does not adhere to prescribed medication regimen  Pharmacist Clinical Goal(s):  Patient will achieve  adherence to monitoring guidelines and medication adherence to achieve therapeutic efficacy through collaboration with PharmD and provider.   Interventions: 1:1 collaboration with Isaac Bliss, Rayford Halsted, MD regarding development and update of comprehensive plan of care as evidenced by provider attestation and co-signature Inter-disciplinary care team collaboration (see longitudinal plan of care) Comprehensive medication review performed; medication list updated in electronic medical record  Hypertension (BP goal <140/90) -Controlled -Current treatment: Lisinopril 40 mg 1.5 tablets daily  - taking 1 tablet daily Metoprolol succinate 25 mg 1 tablet daily HCTZ 12.5 mg 1 capsule daily -Medications previously tried:  n/a -Current home readings: 120/74, 124/76 (checking with arm cuff a few times a week) -Current dietary habits: plant based diet -Current exercise habits: did not discuss -Denies hypotensive/hypertensive symptoms -Educated on BP goals and benefits of medications for prevention of heart attack, stroke and kidney damage; Exercise goal of 150 minutes per week; Importance of home blood pressure monitoring; Proper BP monitoring technique; -Counseled to monitor BP at home weekly, document, and provide log at future appointments -Counseled on diet and exercise extensively Recommended to continue current medication Requested updated lisinopril directions.  Hyperlipidemia: (LDL goal < 70) -Controlled -Current treatment: Atorvastatin 40 mg 1 tablet daily -Medications previously tried: none  -Current dietary patterns: plant based diet; limits fried foods -Current exercise habits: did not discuss -Educated on Cholesterol goals;  Benefits of statin for ASCVD risk reduction; -Counseled on diet and exercise extensively Recommended to continue current medication  Diabetes (A1c goal <7%) -Controlled -Current medications: Glipizide 5 mg 1/2 tablet as needed for large  meals Trulicity 1.5 mg inject once weekly Metformin 1000  mg 1 tablet twice daily with food -Medications previously tried: n/a  -Current home glucose readings fasting glucose: 110-174 (after meals), 99 before bedtime  post prandial glucose: n/a -Denies hypoglycemic/hyperglycemic symptoms -Current meal patterns:  breakfast: did not discuss  lunch: did not discuss   dinner: did not discuss  snacks: did not discuss  drinks: did not discuss  -Current exercise: did not discuss -Educated on A1c and blood sugar goals; Benefits of routine self-monitoring of blood sugar; Carbohydrate counting and/or plate method -Counseled to check feet daily and get yearly eye exams -Counseled on diet and exercise extensively Recommended to continue current medication  Atrial Fibrillation (Goal: prevent stroke and major bleeding) -Controlled -CHADSVASC: 3 -Current treatment: Rate control: metoprolol succinate 25 mg daily; propranolol 10 mg 1 tablet 4 times daily as needed Anticoagulation: Eliquis 5 mg 1 tablet twice daily -Medications previously tried: diltiazem -Home BP and HR readings: refer to above; HR 69-74   -Counseled on increased risk of stroke due to Afib and benefits of anticoagulation for stroke prevention; avoidance of NSAIDs due to increased bleeding risk with anticoagulants; -Recommended to continue current medication  Health Maintenance -Vaccine gaps: shingrix, COVID booster -Current therapy:  Multivitamin daily Vitamin B complex daily Sildenafil as needed -Educated on Cost vs benefit of each product must be carefully weighed by individual consumer -Patient is satisfied with current therapy and denies issues -Recommended to continue current medication  Patient Goals/Self-Care Activities Patient will:  - take medications as prescribed as evidenced by patient report and record review check glucose a few times a week, document, and provide at future appointments check blood pressure  daily, document, and provide at future appointments target a minimum of 150 minutes of moderate intensity exercise weekly  Follow Up Plan: Telephone follow up appointment with care management team member scheduled for: 1 year       Medication Assistance: None required.  Patient affirms current coverage meets needs.  Compliance/Adherence/Medication fill history: Care Gaps: BP 136/74 A1C - 6.7 AWV- 6/22 CCM- 11/22 Hepatitis C Screening - Overdue Zoster Vacine - Overdue Foot Exam - Overdue COVID Vaccine #4 Therapist, music) - Overdue  Star-Rating Drugs: Atorvastatin 40 mg - Last filled 07/21/2021 30 DS at [facility] Metformin 1000 mg - Last filled 07/21/2021 30 DS at [facility] Lisinopril 40 mg - Last filled - 07/21/2021 30 DS at [facility] Duaglutide 1.5 mg -Last filled 08/11/21 28 DS at  [facility] Glipizide (Glucotrol) 5 mg - No fill history was prescribed 08/03/21   Patient's preferred pharmacy is:  [facility] - [place], [place] - [address] [place] [address] Phone: [phone] Fax: [phone]  [id number] [place] [address]: [phone]: [phone] Uses pill box? No - has own system Pt endorses 90% compliance  We discussed: Benefits of medication synchronization, packaging and delivery as well as enhanced pharmacist oversight with [facility]. Patient decided to: Utilize [facility] pharmacy for medication synchronization, packaging and delivery  Care Plan and Follow Up Patient Decision:  Patient agrees to Care Plan and Follow-up.  Plan: Telephone follow up appointment with care management team member scheduled for:  1 year  MJeni Salles PharmD, BTonka BayPharmacist LGibsonat BLa Moca [phone]

## 2021-08-25 DIAGNOSIS — C678 Malignant neoplasm of overlapping sites of bladder: Secondary | ICD-10-CM | POA: Diagnosis not present

## 2021-08-27 DIAGNOSIS — C678 Malignant neoplasm of overlapping sites of bladder: Secondary | ICD-10-CM | POA: Diagnosis not present

## 2021-09-03 DIAGNOSIS — Z5111 Encounter for antineoplastic chemotherapy: Secondary | ICD-10-CM | POA: Diagnosis not present

## 2021-09-03 DIAGNOSIS — C678 Malignant neoplasm of overlapping sites of bladder: Secondary | ICD-10-CM | POA: Diagnosis not present

## 2021-09-10 DIAGNOSIS — Z5111 Encounter for antineoplastic chemotherapy: Secondary | ICD-10-CM | POA: Diagnosis not present

## 2021-09-10 DIAGNOSIS — C678 Malignant neoplasm of overlapping sites of bladder: Secondary | ICD-10-CM | POA: Diagnosis not present

## 2021-09-13 ENCOUNTER — Other Ambulatory Visit: Payer: Self-pay | Admitting: Cardiovascular Disease

## 2021-09-13 ENCOUNTER — Telehealth: Payer: Self-pay | Admitting: Pharmacist

## 2021-09-13 ENCOUNTER — Telehealth: Payer: Self-pay | Admitting: Cardiovascular Disease

## 2021-09-13 NOTE — Telephone Encounter (Signed)
*  STAT* If patient is at the pharmacy, call can be transferred to refill team.   1. Which medications need to be refilled? (please list name of each medication and dose if known) metoprolol succinate (TOPROL-XL) 25 MG 24 hr tablet  2. Which pharmacy/location (including street and city if local pharmacy) is medication to be sent to? [facility] - [place], [place] - [address]  3. Do they need a 30 day or 90 day supply? [place]

## 2021-09-13 NOTE — Progress Notes (Signed)
Chronic Care Management Pharmacy Assistant   Name: GENTLE HOGE  MRN: [id number] DOB: 01-30-1951  Reason for Encounter: Medication Review Medication Coordination    Conditions to be addressed/monitored: HTN  Recent office visits:  None  Recent consult visits:  None   Hospital visits:  None in previous 6 months  Medications: Outpatient Encounter Medications as of 09/13/2021  Medication Sig   atorvastatin (LIPITOR) 40 MG tablet TAKE ONE TABLET BY MOUTH ONCE DAILY   b complex vitamins capsule Take 1 capsule by mouth daily.   Dulaglutide (TRULICITY) 1.5 NK/5.3ZJ SOPN INJECT 1.5 MG INTO THE SKIN ONCE A WEEK.   ELIQUIS 5 MG TABS tablet TAKE ONE TABLET BY MOUTH TWICE DAILY   glipiZIDE (GLUCOTROL) 5 MG tablet Take 0.5 tablets (2.5 mg total) by mouth daily before supper.   glucose blood test strip 1 each by Other route daily. Use daily for glucose control - Dx 250.00   hydrochlorothiazide (MICROZIDE) 12.5 MG capsule TAKE ONE CAPSULE BY MOUTH DAILY   lisinopril (ZESTRIL) 40 MG tablet TAKE 1 AND 1/2 TABLETS BY MOUTH DAILY (Patient taking differently: Take 40 mg by mouth daily. TAKE 1 AND 1/2 TABLETS BY MOUTH DAILY)   metFORMIN (GLUCOPHAGE) 1000 MG tablet TAKE ONE TABLET BY MOUTH TWICE DAILY WITH FOOD   metoprolol succinate (TOPROL-XL) 25 MG 24 hr tablet Take 25 mg by mouth daily.   Multiple Vitamin (MULTIVITAMIN) tablet Take 1 tablet by mouth daily.   propranolol (INDERAL) 10 MG tablet Take 1 tablet (10 mg total) by mouth 4 (four) times daily as needed (palpitations, irregular or fast heart rate).   sildenafil (REVATIO) 20 MG tablet TAKE 1 TO 2 TABLETS 2 TO 3 HOURS BEFORE SEX   No facility-administered encounter medications on file as of 09/13/2021.  Reviewed chart prior to disease state call. Spoke with patient regarding BP  Recent Office Vitals: BP Readings from Last 3 Encounters:  08/17/21 138/76  08/03/21 136/74  06/18/21 139/75   Pulse Readings from Last 3  Encounters:  08/17/21 73  08/03/21 75  06/18/21 61    Wt Readings from Last 3 Encounters:  08/17/21 209 lb 3.2 oz (94.9 kg)  08/03/21 209 lb 12.8 oz (95.2 kg)  06/18/21 211 lb 3.2 oz (95.8 kg)     Kidney Function Lab Results  Component Value Date/Time   CREATININE 0.70 06/18/2021 09:04 AM   CREATININE 0.77 04/20/2021 11:02 AM   GFR 90.61 04/20/2021 11:02 AM   GFRNONAA 88 10/21/2019 11:38 AM   GFRAA 101 10/21/2019 11:38 AM    BMP Latest Ref Rng & Units 06/18/2021 04/20/2021 01/28/2021  Glucose 70 - 99 mg/dL 151(H) 116(H) 134(H)  BUN 8 - 23 mg/dL 14 10 9   Creatinine 0.61 - 1.24 mg/dL 0.70 0.77 0.85  BUN/Creat Ratio 10 - 24 - - -  Sodium 135 - 145 mmol/L 137 134(L) 134(L)  Potassium 3.5 - 5.1 mmol/L 4.2 4.0 4.6  Chloride 98 - 111 mmol/L 100 98 99  CO2 19 - 32 mEq/L - 28 30  Calcium 8.4 - 10.5 mg/dL - 9.4 9.6    Current antihypertensive regimen:  Lisinopril 40 mg 1.5 tablets daily  - taking 1 tablet daily Metoprolol succinate 25 mg 1 tablet daily HCTZ 12.5 mg 1 capsule daily Current home BP readings: Office reading 138/76 (10/25) What recent interventions/DTPs have been made by any provider to improve Blood Pressure control since last CPP Visit: None  Any recent hospitalizations or ED visits since last visit with  CPP? No Adherence Review: Is the patient currently on ACE/ARB medication? No Does the patient have >5 day gap between last estimated fill dates? No   Reviewed chart for medication changes ahead of medication coordination call.  No OVs, Consults, or hospital visits since last care coordination call/Pharmacist visit.   No medication changes indicated   BP Readings from Last 3 Encounters:  08/17/21 138/76  08/03/21 136/74  06/18/21 139/75    Lab Results  Component Value Date   HGBA1C 6.7 (A) 08/03/2021     Patient obtains medications through Vials  30 Days   Last adherence delivery included: (medication name and frequency) Lisinopril 40 mg: one and a half  tablet daily (Patient states he is only taking one)CPP AWARE  Metformin HCL 1,000 mg: one tablet twice daily at bedtime Atorvastatin 40 mg: one tablet daily at bedtime Hydrochlorothiazide (MICROZIDE) 12.5 MG capsule: one tablet daily     Patient is due for next adherence delivery on: 09/24/21. Called patient and reviewed medications and coordinated delivery. Confirmed vials for 30 DS  This delivery to include: Lisinopril 40 mg: one and a half tablet daily Metformin HCL 1,000 mg: one tablet twice daily at bedtime Atorvastatin 40 mg: one tablet daily at bedtime Hydrochlorothiazide (MICROZIDE) 12.5 MG capsule: one tablet daily Metoprolol ER 25 mg : take one tab daily   Patient Called the Pharmacy on Today requested Acute fill for Metoprolol and Trulicity.To be delivered 09/13/21 Requested Metoprolol be sent over to the pharmacy/ prescription requested.   Patient declined needing any Eliquis as of yet due to abundance on hand   Confirmed delivery date of 09/24/21, advised patient that pharmacy will contact them the morning of delivery.    Care Gaps: BP 138/76 (10/22) A1C- 6.7 AWV- 6/22 CCM- 11/23 Hepatitis C Screening - Overdue Zoster Vacine - Overdue Foot Exam - Overdue COVID Vaccine #4 [employer]) - Overdue  Star Rating Drugs: Atorvastatin 40 mg - Last filled 08/19/2021 30 DS at [facility] Metformin 1000 mg - Last filled 08/19/2021 30 DS at [facility] Lisinopril 40 mg - Last filled - 08/19/2021 30 DS at [facility] Duaglutide 1.5 mg -Last filled 09/13/21 28 DS at  [facility] Glipizide (Glucotrol) 5 mg - No fill history was prescribed 08/03/21   [facility] Pharmacist Assistant [phone]

## 2021-09-13 NOTE — Progress Notes (Signed)
Per Pharmacy call to Dr Elmarie Shiley office requested Metoprolol refill be sent over to [facility] electronically.  [facility] Pharmacist Assistant [phone]

## 2021-09-22 DIAGNOSIS — C678 Malignant neoplasm of overlapping sites of bladder: Secondary | ICD-10-CM | POA: Diagnosis not present

## 2021-10-13 ENCOUNTER — Other Ambulatory Visit: Payer: Self-pay | Admitting: Internal Medicine

## 2021-10-13 DIAGNOSIS — I10 Essential (primary) hypertension: Secondary | ICD-10-CM

## 2021-10-14 ENCOUNTER — Telehealth: Payer: Self-pay | Admitting: Pharmacist

## 2021-10-14 NOTE — Chronic Care Management (AMB) (Signed)
Chronic Care Management Pharmacy Assistant   Name: Robert [name]  MRN: [id number] DOB: 06-21-1951  Reason for Encounter: Medication Review   Conditions to be addressed/monitored: HTN  Recent office visits:  None  Recent consult visits:  None  Hospital visits:  Medication Reconciliation was completed by comparing discharge summary, patients EMR and Pharmacy list, and upon discussion with patient.   06-18-2021 [place] - Patient presented for Transurethral resection of bladder tumor. Patient was there for 5 hours.   Medications that remain the same after Hospital Discharge:??  -All other medications will remain the same.      Medications: Outpatient Encounter Medications as of 10/14/2021  Medication Sig   atorvastatin (LIPITOR) 40 MG tablet TAKE ONE TABLET BY MOUTH ONCE DAILY   b complex vitamins capsule Take 1 capsule by mouth daily.   Dulaglutide (TRULICITY) 1.5 OI/7.1IW SOPN INJECT 1.5 MG INTO THE SKIN ONCE A WEEK.   ELIQUIS 5 MG TABS tablet TAKE ONE TABLET BY MOUTH TWICE DAILY   glipiZIDE (GLUCOTROL) 5 MG tablet Take 0.5 tablets (2.5 mg total) by mouth daily before supper.   glucose blood test strip 1 each by Other route daily. Use daily for glucose control - Dx 250.00   hydrochlorothiazide (MICROZIDE) 12.5 MG capsule TAKE ONE CAPSULE BY MOUTH ONCE DAILY   lisinopril (ZESTRIL) 40 MG tablet TAKE 1 AND 1/2 TABLETS BY MOUTH DAILY (Patient taking differently: Take 40 mg by mouth daily. TAKE 1 AND 1/2 TABLETS BY MOUTH DAILY)   metFORMIN (GLUCOPHAGE) 1000 MG tablet TAKE ONE TABLET BY MOUTH TWICE DAILY WITH FOOD   metoprolol succinate (TOPROL-XL) 25 MG 24 hr tablet TAKE ONE TABLET BY MOUTH ONCE DAILY   Multiple Vitamin (MULTIVITAMIN) tablet Take 1 tablet by mouth daily.   propranolol (INDERAL) 10 MG tablet Take 1 tablet (10 mg total) by mouth 4 (four) times daily as needed (palpitations, irregular or fast heart rate).   sildenafil (REVATIO) 20 MG  tablet TAKE 1 TO 2 TABLETS 2 TO 3 HOURS BEFORE SEX   No facility-administered encounter medications on file as of 10/14/2021.  Reviewed chart prior to disease state call. Spoke with patient regarding BP  Recent Office Vitals: BP Readings from Last 3 Encounters:  08/17/21 138/76  08/03/21 136/74  06/18/21 139/75   Pulse Readings from Last 3 Encounters:  08/17/21 73  08/03/21 75  06/18/21 61    Wt Readings from Last 3 Encounters:  08/17/21 209 lb 3.2 oz (94.9 kg)  08/03/21 209 lb 12.8 oz (95.2 kg)  06/18/21 211 lb 3.2 oz (95.8 kg)     Kidney Function Lab Results  Component Value Date/Time   CREATININE 0.70 06/18/2021 09:04 AM   CREATININE 0.77 04/20/2021 11:02 AM   GFR 90.61 04/20/2021 11:02 AM   GFRNONAA 88 10/21/2019 11:38 AM   GFRAA 101 10/21/2019 11:38 AM    BMP Latest Ref Rng & Units 06/18/2021 04/20/2021 01/28/2021  Glucose 70 - 99 mg/dL 151(H) 116(H) 134(H)  BUN 8 - 23 mg/dL 14 10 9   Creatinine 0.61 - 1.24 mg/dL 0.70 0.77 0.85  BUN/Creat Ratio 10 - 24 - - -  Sodium 135 - 145 mmol/L 137 134(L) 134(L)  Potassium 3.5 - 5.1 mmol/L 4.2 4.0 4.6  Chloride 98 - 111 mmol/L 100 98 99  CO2 19 - 32 mEq/L - 28 30  Calcium 8.4 - 10.5 mg/dL - 9.4 9.6    Current antihypertensive regimen:  Lisinopril 40 mg 1.5 tablets daily  -  taking 1 tablet daily Metoprolol succinate 25 mg 1 tablet daily HCTZ 12.5 mg 1 capsule daily How often are you checking your Blood Pressure? infrequently Current home BP readings:  BP Readings from Last 3 Encounters:  08/17/21 138/76  08/03/21 136/74  06/18/21 139/75   What recent interventions/DTPs have been made by any provider to improve Blood Pressure control since last CPP Visit: Patient reports no changes Any recent hospitalizations or ED visits since last visit with CPP? No   Adherence Review: Is the patient currently on ACE/ARB medication? Yes Does the patient have >5 day gap between last estimated fill dates? No  Reviewed chart for  medication changes ahead of medication coordination call.  No OVs, Consults, or hospital visits since last care coordination call/Pharmacist visit.   No medication changes indicated  BP Readings from Last 3 Encounters:  08/17/21 138/76  08/03/21 136/74  06/18/21 139/75    Lab Results  Component Value Date   HGBA1C 6.7 (A) 08/03/2021     Patient obtains medications through Vials  30 Days   Last adherence delivery included:   Lisinopril 40 mg: one and a half tablet daily Metformin HCL 1,000 mg: one tablet twice daily at bedtime Atorvastatin 40 mg: one tablet daily at bedtime Hydrochlorothiazide (MICROZIDE) 12.5 MG capsule: one tablet daily Metoprolol ER 25 mg : take one tab daily     Patient Called the Pharmacy and requested Acute fill for Metoprolol and Trulicity.To be delivered 09/13/21 Requested Metoprolol be sent over to the pharmacy/ prescription requested.    Patient declined needing any Eliquis as of yet due to abundance on hand     Patient is due for next adherence delivery on: 10/26/20. Called patient and reviewed medications and coordinated delivery. Confirmed vials for 30 DS  This delivery to include: Lisinopril 40 mg: one and a half tablet daily Metformin HCL 1,000 mg: one tablet twice daily at bedtime Atorvastatin 40 mg: one tablet daily at bedtime Hydrochlorothiazide (MICROZIDE) 12.5 MG capsule: one tablet daily Metoprolol ER 25 mg : take one tab daily   Patient Declined: Trulicity: Inject 1.5 mg into the skin once a week / Reports he has enough for 3 weeks   Confirmed delivery date of 10/26/20, advised patient that pharmacy will contact them the morning of delivery.   Care Gaps: BP 138/76 (10/22) A1C- 6.7 AWV- 6/22 CCM- 11/23 Hepatitis C Screening - Overdue Zoster Vacine - Overdue Foot Exam - Overdue COVID Vaccine #4 [employer]) - Overdue  Star Rating Drugs: Atorvastatin 40 mg - Last filled 09/20/2021 30 DS at [facility] Metformin 1000 mg - Last filled  09/20/2021 30 DS at [facility] Lisinopril 40 mg - Last filled - 09/20/2021 30 DS at [facility] Duaglutide 1.5 mg -Last filled 10/05/21 28 DS at  [facility] Glipizide (Glucotrol) 5 mg - No fill history was prescribed 08/03/21     [facility] Pharmacist Assistant [phone]

## 2021-10-19 ENCOUNTER — Other Ambulatory Visit: Payer: Self-pay

## 2021-10-19 ENCOUNTER — Ambulatory Visit: Payer: Self-pay

## 2021-10-19 ENCOUNTER — Ambulatory Visit (INDEPENDENT_AMBULATORY_CARE_PROVIDER_SITE_OTHER): Payer: Medicare HMO

## 2021-10-19 ENCOUNTER — Ambulatory Visit: Payer: Medicare HMO | Admitting: Family Medicine

## 2021-10-19 VITALS — BP 164/98 | HR 84 | Ht 75.0 in | Wt 213.0 lb

## 2021-10-19 DIAGNOSIS — M25551 Pain in right hip: Secondary | ICD-10-CM

## 2021-10-19 DIAGNOSIS — M7061 Trochanteric bursitis, right hip: Secondary | ICD-10-CM | POA: Diagnosis not present

## 2021-10-19 DIAGNOSIS — M1611 Unilateral primary osteoarthritis, right hip: Secondary | ICD-10-CM | POA: Diagnosis not present

## 2021-10-19 NOTE — Patient Instructions (Addendum)
Thank you for coming in today.   You received a steroid injection today. Seek immediate medical attention if the joint becomes red, extremely painful, or is oozing fluid.   Please get an Xray today before you leave   Recheck back in 1 month

## 2021-10-19 NOTE — Progress Notes (Signed)
I, Peterson Lombard, LAT, ATC acting as a scribe for Lynne Leader, MD.  Robert [name] is a 70 y.o. male who presents to [facility] today for cont R hip pain. Pt was last seen by Dr. [name] on 10/28/19 and was taught HEP for R hip abductor tendinitis/trochanteric bursitis. Today, pt reports R hip started hurting again about 3 months ago. Pt has been doing stretches/HEP, but pain continued. Pt locates pain posterior-lateral aspect of the R hip w/ radiating pain across the anterior aspect of the R thigh to knee. No LBP noted. Pt notes increased pain when making rotational movements.  His wife has dementia so has had to significantly reduce his physical activity to care for her and had to stop exercise.  Dx imaging: 10/28/19 R hip XR  Pertinent review of systems: No fevers or chills  Relevant historical information: Diabetes well controlled.  Atrial fibrillation on Eliquis.   Exam:  BP (!) 164/98    Pulse 84    Ht 6\' 3"  (1.905 m)    Wt 213 lb (96.6 kg)    SpO2 98%    BMI 26.62 kg/m  General: Well Developed, well nourished, and in no acute distress.   MSK:  Right hip normal-appearing Tender palpation greater trochanter. Decreased hip motion to flexion and internal rotation with some pain at anterior hip. Hip abduction strength diminished 4/5.  Extremity strength diminished 4/5.    Lab and Radiology Results \\Procedure : Real-time Ultrasound Guided Injection of the right trochanter bursa Device: Philips Affiniti 50G Images permanently stored and available for review in PACS Verbal informed consent obtained.  Discussed risks and benefits of procedure. Warned about infection bleeding damage to structures skin hypopigmentation and fat atrophy among others. Patient expresses understanding and agreement Time-out conducted.   Noted no overlying erythema, induration, or other signs of local infection.   Skin prepped in a sterile fashion.   Local anesthesia:  Topical Ethyl chloride.   With sterile technique and under real time ultrasound guidance: 40 mg of Kenalog and 2 mL of Marcaine injected into greater trochanter bursa. Fluid seen entering the bursa.   Completed without difficulty   Pain immediately resolved suggesting accurate placement of the medication.   Advised to call if fevers/chills, erythema, induration, drainage, or persistent bleeding.   Images permanently stored and available for review in the ultrasound unit.  Impression: Technically successful ultrasound guided injection.   X-ray images right hip obtained today personally and independently interpreted .  X-rays compared to right hip x-ray dated October 28, 2019. 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  No acute fractures. Await formal radiology review      Assessment and Plan: 70 y.o. male with right lateral and anterior hip pain. Lateral hip pain is a more dominant issue today and thought to be due to greater trochanteric bursitis.  Plan on greater trochanter bursa injection today and hip abduction strengthening.  Additionally he has some anterior hip pain which is a lesser issue and thought to be due to the newly diagnosed hip arthritis today. Physical therapy would be an obvious choice for him however his care needs at home with his wife will prevent him from easily accessing physical therapy. Recheck in about 4 weeks.   PDMP not reviewed this encounter. Orders Placed This Encounter  Procedures   [place] LIMITED JOINT SPACE STRUCTURES LOW RIGHT(NO LINKED CHARGES)    Standing Status:   Future    Number of Occurrences:  1    Standing Expiration Date:   04/19/2022    Order Specific Question:   Reason for Exam (SYMPTOM  OR DIAGNOSIS REQUIRED)    Answer:   right hip pain    Order Specific Question:   Preferred imaging location?    Answer:   [facility]   DG HIP UNILAT W OR W/O PELVIS 2-3 VIEWS RIGHT    Standing Status:   Future    Number of  Occurrences:   1    Standing Expiration Date:   10/19/2022    Order Specific Question:   Reason for Exam (SYMPTOM  OR DIAGNOSIS REQUIRED)    Answer:   right hip pain    Order Specific Question:   Preferred imaging location?    Answer:   [facility]   No orders of the defined types were placed in this encounter.    Discussed warning signs or symptoms. Please see discharge instructions. Patient expresses understanding.   The above documentation has been reviewed and is accurate and complete Lynne Leader, M.D.

## 2021-10-20 NOTE — Progress Notes (Signed)
You have developed hip arthritis since x-ray January 2021 worse on the right.

## 2021-11-15 ENCOUNTER — Telehealth: Payer: Self-pay | Admitting: Pharmacist

## 2021-11-15 DIAGNOSIS — H6591 Unspecified nonsuppurative otitis media, right ear: Secondary | ICD-10-CM | POA: Diagnosis not present

## 2021-11-15 DIAGNOSIS — J029 Acute pharyngitis, unspecified: Secondary | ICD-10-CM | POA: Diagnosis not present

## 2021-11-15 NOTE — Chronic Care Management (AMB) (Addendum)
Chronic Care Management Pharmacy Assistant   Name: Robert [name]  MRN: [id number] DOB: 02-03-1951  Reason for Encounter: Medication Review   Conditions to be addressed/monitored: HTN  Recent office visits:  None   Recent consult visits:  10/19/21 Robert Hams, MD - Patient presented for right hip pain and other concerns. No medication changes.  Hospital visits:  None in previous 6 months  Medications: Outpatient Encounter Medications as of 11/15/2021  Medication Sig   atorvastatin (LIPITOR) 40 MG tablet TAKE ONE TABLET BY MOUTH ONCE DAILY   b complex vitamins capsule Take 1 capsule by mouth daily.   Dulaglutide (TRULICITY) 1.5 LO/7.5IE SOPN INJECT 1.5 MG INTO THE SKIN ONCE A WEEK.   ELIQUIS 5 MG TABS tablet TAKE ONE TABLET BY MOUTH TWICE DAILY   glipiZIDE (GLUCOTROL) 5 MG tablet Take 0.5 tablets (2.5 mg total) by mouth daily before supper.   glucose blood test strip 1 each by Other route daily. Use daily for glucose control - Dx 250.00   hydrochlorothiazide (MICROZIDE) 12.5 MG capsule TAKE ONE CAPSULE BY MOUTH ONCE DAILY   lisinopril (ZESTRIL) 40 MG tablet TAKE 1 AND 1/2 TABLETS BY MOUTH DAILY (Patient taking differently: Take 40 mg by mouth daily. TAKE 1 AND 1/2 TABLETS BY MOUTH DAILY)   metFORMIN (GLUCOPHAGE) 1000 MG tablet TAKE ONE TABLET BY MOUTH TWICE DAILY WITH FOOD   metoprolol succinate (TOPROL-XL) 25 MG 24 hr tablet TAKE ONE TABLET BY MOUTH ONCE DAILY   Multiple Vitamin (MULTIVITAMIN) tablet Take 1 tablet by mouth daily.   propranolol (INDERAL) 10 MG tablet Take 1 tablet (10 mg total) by mouth 4 (four) times daily as needed (palpitations, irregular or fast heart rate).   sildenafil (REVATIO) 20 MG tablet TAKE 1 TO 2 TABLETS 2 TO 3 HOURS BEFORE SEX   No facility-administered encounter medications on file as of 11/15/2021.  Reviewed chart prior to disease state call. Spoke with patient regarding BP  Recent Office Vitals: BP Readings from Last 3 Encounters:   10/19/21 (!) 164/98  08/17/21 138/76  08/03/21 136/74   Pulse Readings from Last 3 Encounters:  10/19/21 84  08/17/21 73  08/03/21 75    Wt Readings from Last 3 Encounters:  10/19/21 213 lb (96.6 kg)  08/17/21 209 lb 3.2 oz (94.9 kg)  08/03/21 209 lb 12.8 oz (95.2 kg)     Kidney Function Lab Results  Component Value Date/Time   CREATININE 0.70 06/18/2021 09:04 AM   CREATININE 0.77 04/20/2021 11:02 AM   GFR 90.61 04/20/2021 11:02 AM   GFRNONAA 88 10/21/2019 11:38 AM   GFRAA 101 10/21/2019 11:38 AM    BMP Latest Ref Rng & Units 06/18/2021 04/20/2021 01/28/2021  Glucose 70 - 99 mg/dL 151(H) 116(H) 134(H)  BUN 8 - 23 mg/dL 14 10 9   Creatinine 0.61 - 1.24 mg/dL 0.70 0.77 0.85  BUN/Creat Ratio 10 - 24 - - -  Sodium 135 - 145 mmol/L 137 134(L) 134(L)  Potassium 3.5 - 5.1 mmol/L 4.2 4.0 4.6  Chloride 98 - 111 mmol/L 100 98 99  CO2 19 - 32 mEq/L - 28 30  Calcium 8.4 - 10.5 mg/dL - 9.4 9.6    Current antihypertensive regimen:  Lisinopril 40 mg 1.5 tablets daily  - taking 1 tablet daily Metoprolol succinate 25 mg 1 tablet daily HCTZ 12.5 mg 1 capsule daily How often are you checking your Blood Pressure? infrequently Current home BP readings:  BP Readings from Last 3 Encounters:  10/19/21 [name] Kitchen)  164/98  08/17/21 138/76  08/03/21 136/74   What recent interventions/DTPs have been made by any provider to improve Blood Pressure control since last CPP Visit: Patient reports no changes Any recent hospitalizations or ED visits since last visit with CPP? No What diet changes have been made to improve Blood Pressure Control?  Patient reports no changes What exercise is being done to improve your Blood Pressure Control?  Patient is active in caring for his wife whom has dementia.  Adherence Review: Is the patient currently on ACE/ARB medication? Yes Does the patient have >5 day gap between last estimated fill dates? No   Reviewed chart for medication changes ahead of medication  coordination call.  No OVs, Consults, or hospital visits since last care coordination call/Pharmacist visit.   No medication changes indicated  BP Readings from Last 3 Encounters:  10/19/21 (!) 164/98  08/17/21 138/76  08/03/21 136/74    Lab Results  Component Value Date   HGBA1C 6.7 (A) 08/03/2021     Patient obtains medications through Vials  30 Days   Last adherence delivery included:  Lisinopril 40 mg: one and a half tablet daily Metformin HCL 1,000 mg: one tablet twice daily at bedtime Atorvastatin 40 mg: one tablet daily at bedtime Hydrochlorothiazide (MICROZIDE) 12.5 MG capsule: one tablet daily Metoprolol ER 25 mg : take one tab daily     Patient Declined: Trulicity: Inject 1.5 mg into the skin once a week / Reports he has enough for 3 weeks    Patient is due for next adherence delivery on: 11/24/21. Called patient and reviewed medications and coordinated delivery. Patient confirmed vials no safety caps for 30 DS  This delivery to include: Hydrochlorothiazide (MICROZIDE) 12.5 MG capsule: one tablet daily Metoprolol ER 25 mg : take one tab daily Metformin HCL 1,000 mg: one tablet twice daily at bedtime Atorvastatin 40 mg: one tablet daily at bedtime Eliquis 5 mg : take one tab twice daily  Patient declined the following medications:  Lisinopril 40 mg: one and a half tablet daily -patent has an abundance on hand  Trulicity: Inject 1.5 mg into the skin once a week - patient reports he called to have this sent on 11/15/21 for a 4 week supply as his last one froze and he could not use   Confirmed delivery date of 11/24/21, advised patient that pharmacy will contact them the morning of delivery.   Care Gaps: BP 138/76 (10/22) A1C- 6.7 AWV- 6/22 CCM- 11/23 Hepatitis C Screening - Overdue Zoster Vacine - Overdue Foot Exam - Overdue COVID Vaccine #4 [employer]) - Overdue  Star Rating Drugs: Atorvastatin 40 mg - Last filled 10/20/2021 30 DS at [facility] Metformin  1000 mg - Last filled 10/20/2021 30 DS at [facility] Lisinopril 40 mg - Last filled - 10/20/2021 30 DS at [facility] Duaglutide 1.5 mg -Last filled 10/05/21 28 DS at  [facility] Glipizide (Glucotrol) 5 mg - No fill history was prescribed 08/03/21     [facility] Pharmacist Assistant [phone]

## 2021-11-23 ENCOUNTER — Ambulatory Visit (INDEPENDENT_AMBULATORY_CARE_PROVIDER_SITE_OTHER): Payer: Medicare HMO | Admitting: Family Medicine

## 2021-11-23 ENCOUNTER — Ambulatory Visit: Payer: Self-pay

## 2021-11-23 ENCOUNTER — Other Ambulatory Visit: Payer: Self-pay

## 2021-11-23 VITALS — BP 152/88 | HR 85 | Ht 75.0 in | Wt 209.0 lb

## 2021-11-23 DIAGNOSIS — M1611 Unilateral primary osteoarthritis, right hip: Secondary | ICD-10-CM

## 2021-11-23 DIAGNOSIS — M25551 Pain in right hip: Secondary | ICD-10-CM

## 2021-11-23 DIAGNOSIS — M7061 Trochanteric bursitis, right hip: Secondary | ICD-10-CM | POA: Diagnosis not present

## 2021-11-23 NOTE — Patient Instructions (Addendum)
Thank you for coming in today.   You received a steroid injection in your right hip today. Seek immediate medical attention if the joint becomes red, extremely painful, or is oozing fluid.   Keep working on the exercises at home.  Recheck back in 1 month

## 2021-11-23 NOTE — Progress Notes (Signed)
I, Peterson Lombard, LAT, ATC acting as a scribe for Lynne Leader, MD.  Robert [name] is a 71 y.o. male who presents to [place] at [facility] today for f/u R hip pain thought to be due to GT bursitis and hip arthritis. Of note, pt cares for his wife, suffering from dementia. Pt was last seen by Dr. [name] on 10/19/21 and was given a R GT steroid injection and advised to cont to work on hip ABD exercises. Today, pt reports some relief from the prior steroid injection. Pt c/o increased pain when transitioning to stand and going up stairs. Pt locates pain to the R posterior hip, groin, and into the anterior R thigh. Pt has been compliant w/ HEP and does them every morning.  Dx imaging: 10/19/21 R hip XR 10/28/19 R hip XR  Pertinent review of systems: no fever or chills  Relevant historical information: Diabetes.  Providing caregiving to his wife who is sick.   Exam:  BP (!) 152/88    Pulse 85    Ht 6\' 3"  (1.905 m)    Wt 209 lb (94.8 kg)    SpO2 99%    BMI 26.12 kg/m  General: Well Developed, well nourished, and in no acute distress.   MSK: Right hip normal-appearing decreased range of motion.    Lab and Radiology Results  Procedure: Real-time Ultrasound Guided Injection of right hip femoral acetabular joint anterior approach Device: Philips Affiniti 50G Images permanently stored and available for review in PACS Verbal informed consent obtained.  Discussed risks and benefits of procedure. Warned about infection bleeding damage to structures skin hypopigmentation and fat atrophy among others. Patient expresses understanding and agreement Time-out conducted.   Noted no overlying erythema, induration, or other signs of local infection.   Skin prepped in a sterile fashion.   Local anesthesia: Topical Ethyl chloride.   With sterile technique and under real time ultrasound guidance: 40 mg of Kenalog and 2 mL of Marcaine injected into hip joint. Fluid seen entering  the joint capsule.   Completed without difficulty   Pain immediately resolved suggesting accurate placement of the medication.   Advised to call if fevers/chills, erythema, induration, drainage, or persistent bleeding.   Images permanently stored and available for review in the ultrasound unit.  Impression: Technically successful ultrasound guided injection.      EXAM: DG HIP (WITH OR WITHOUT PELVIS) 2-3V RIGHT   COMPARISON:  10/28/2019   FINDINGS: Frontal view of the pelvis as well as frontal and frogleg lateral views of the right hip are obtained. No fracture, subluxation, or dislocation within either hip. Progressive bilateral hip osteoarthritis, right greater than left. Sacroiliac joints are unremarkable. Stable lower lumbar spondylosis.   IMPRESSION: 1. Progressive bilateral hip osteoarthritis, right greater than left. 2. No acute bony abnormalities. 3. Stable lower lumbar spondylosis.     Electronically Signed   By: Randa Ngo M.D.   On: 10/19/2021 22:37 I, Lynne Leader, personally (independently) visualized and performed the interpretation of the images attached in this note.      Assessment and Plan: 71 y.o. male with right hip pain.  Pain multifactorial.  Patient does have some lateral and posterior hip pain thought to be hip abductor tendinopathy and trochanteric bursitis.  However a lot of the anterior hip pain I think is due to the DJD seen on x-ray.  He had pretty good resolution of his anterior hip pain with injection today.  Plan to continue home exercise  program and recheck again in a month.  Consider formal physical therapy if needed.  He will have trouble going to physical therapy and trouble managing the aftercare of a hip replacement given his caregiving needs but both of these may be the next steps that are needed in the future if he cannot get better.   PDMP not reviewed this encounter. Orders Placed This Encounter  Procedures   [place] LIMITED JOINT  SPACE STRUCTURES LOW RIGHT(NO LINKED CHARGES)    Standing Status:   Future    Number of Occurrences:   1    Standing Expiration Date:   05/23/2022    Order Specific Question:   Reason for Exam (SYMPTOM  OR DIAGNOSIS REQUIRED)    Answer:   right hip pain    Order Specific Question:   Preferred imaging location?    Answer:   [place]   No orders of the defined types were placed in this encounter.    Discussed warning signs or symptoms. Please see discharge instructions. Patient expresses understanding.   The above documentation has been reviewed and is accurate and complete Lynne Leader, M.D.

## 2021-11-24 DIAGNOSIS — C678 Malignant neoplasm of overlapping sites of bladder: Secondary | ICD-10-CM | POA: Diagnosis not present

## 2021-12-06 ENCOUNTER — Encounter: Payer: Self-pay | Admitting: Internal Medicine

## 2021-12-06 ENCOUNTER — Ambulatory Visit: Payer: Medicare HMO | Admitting: Internal Medicine

## 2021-12-06 ENCOUNTER — Telehealth: Payer: Self-pay | Admitting: Pharmacist

## 2021-12-06 ENCOUNTER — Other Ambulatory Visit: Payer: Self-pay

## 2021-12-06 VITALS — BP 120/82 | HR 70 | Ht 75.0 in | Wt 204.8 lb

## 2021-12-06 DIAGNOSIS — E785 Hyperlipidemia, unspecified: Secondary | ICD-10-CM | POA: Diagnosis not present

## 2021-12-06 DIAGNOSIS — E663 Overweight: Secondary | ICD-10-CM

## 2021-12-06 DIAGNOSIS — E119 Type 2 diabetes mellitus without complications: Secondary | ICD-10-CM | POA: Diagnosis not present

## 2021-12-06 LAB — POCT GLYCOSYLATED HEMOGLOBIN (HGB A1C): Hemoglobin A1C: 7.6 % — AB (ref 4.0–5.6)

## 2021-12-06 MED ORDER — TRULICITY 3 MG/0.5ML ~~LOC~~ SOAJ: 3.0000 mg | SUBCUTANEOUS | 3 refills | Status: DC

## 2021-12-06 NOTE — Progress Notes (Signed)
Inbound call from [facility] to advise Trulicity was out of stock. Rx sent to [facility] [address] location per pt request.

## 2021-12-06 NOTE — Patient Instructions (Addendum)
Please continue: - Metformin 2000 mg with dinner - Glipizide 2.5 mg before a large or a late dinner  Please increase: - Trulicity 3 mg weekly  Please return in 4-6 months with your sugar log.

## 2021-12-06 NOTE — Chronic Care Management (AMB) (Signed)
Chronic Care Management Pharmacy Assistant   Name: Robert [name]  MRN: [id number] DOB: 05/24/1951  Reason for Encounter: Trulicity Out of Stock [facility]   Conditions to be addressed/monitored: DMII  Recent office visits:  None  Recent consult visits:  12/06/21 [name], MD - Patient presented for Type 2 diabetes mellitus without complication without long term current use of insulin and other concerns. Increased Trulicity to 3 mg weekly.  11/23/21 [name], MD - Patient presented for right hip pain and other concerns. No medication changes.  11/15/21 Lynnae Prude, [place]  - Patient presented for Earache. No medication changes. Hospital visits:  None in previous 6 months  Medications: Outpatient Encounter Medications as of 12/06/2021  Medication Sig   amoxicillin (AMOXIL) 500 MG capsule Take 500 mg by mouth every 8 (eight) hours.   atorvastatin (LIPITOR) 40 MG tablet TAKE ONE TABLET BY MOUTH ONCE DAILY   b complex vitamins capsule Take 1 capsule by mouth daily.   Dulaglutide (TRULICITY) 3 JY/7.8GN SOPN Inject 3 mg into the skin once a week.   ELIQUIS 5 MG TABS tablet TAKE ONE TABLET BY MOUTH TWICE DAILY   glipiZIDE (GLUCOTROL) 5 MG tablet Take 0.5 tablets (2.5 mg total) by mouth daily before supper.   glucose blood test strip 1 each by Other route daily. Use daily for glucose control - Dx 250.00   hydrochlorothiazide (MICROZIDE) 12.5 MG capsule TAKE ONE CAPSULE BY MOUTH ONCE DAILY   lisinopril (ZESTRIL) 40 MG tablet TAKE 1 AND 1/2 TABLETS BY MOUTH DAILY (Patient taking differently: Take 40 mg by mouth daily. TAKE 1 AND 1/2 TABLETS BY MOUTH DAILY)   metFORMIN (GLUCOPHAGE) 1000 MG tablet TAKE ONE TABLET BY MOUTH TWICE DAILY WITH FOOD   metoprolol succinate (TOPROL-XL) 25 MG 24 hr tablet TAKE ONE TABLET BY MOUTH ONCE DAILY   Multiple Vitamin (MULTIVITAMIN) tablet Take 1 tablet by mouth daily.   propranolol (INDERAL) 10 MG tablet Take 1 tablet (10 mg total) by mouth  4 (four) times daily as needed (palpitations, irregular or fast heart rate).   sildenafil (REVATIO) 20 MG tablet TAKE 1 TO 2 TABLETS 2 TO 3 HOURS BEFORE SEX   No facility-administered encounter medications on file as of 12/06/2021.    Notes: Per [facility] received a dose increase script for Trulicity to 3 mg weekly, pharmacy is out of stock and backordered for this medication. Call to patient to advise [facility] Is also [facility] of stock but [employer] has in stock [id number]) Call to prescribing office to ask it be sent to this location had to leave a message. Call back to patient advised of above and mentioned he also send a my chart request to the office asking it be sent to the above location, he was in agreement.   Care Gaps: Hepatitis C Screening - Overdue Zoster Vaccine  -Overdue Foot exam - Overdue COVID Booster - Overdue Eye Exam - Overdue CCM- 11/23 AWV- 6/22 BP- 120/82 ( 12/06/21) Lab Results  Component Value Date   HGBA1C 7.6 (A) 12/06/2021    Star Rating Drugs: Atorvastatin 40 mg - Last filled 11/17/21 30 DS at [facility] Metformin 1000 mg - Last filled 11/17/21 30 DS at [facility] Lisinopril 40 mg - Last filled - 10/20/2021 30 DS at [facility] Duaglutide 1.5 mg -Last filled 11/15/21 28 DS at  [facility] Glipizide (Glucotrol) 5 mg - No fill history was prescribed 08/03/21  [place] Clinical Pharmacist Assistant [phone]

## 2021-12-06 NOTE — Progress Notes (Signed)
Patient ID: Robert [name], male   DOB: 1951/04/03, 71 y.o.   MRN: [id number]   This visit occurred during the SARS-CoV-2 public health emergency.  Safety protocols were in place, including screening questions prior to the visit, additional usage of staff PPE, and extensive cleaning of exam room while observing appropriate contact time as indicated for disinfecting solutions.   HPI: Robert [name] is a 71 y.o.-year-old male, returning for follow-up for  DM2, dx in 2000s, insulin-independent since 2018, uncontrolled, with complications (PN, ED).  Last visit 4 months ago.  Interim history: Before last visit, he was diagnosed with bladder cancer and had transurethral resection on 06/18/2021.  The cancer was high-grade, but noninvasive. He is getting bladder instillations - off and on Eliquis. He continues on a more plant-based diet, but relaxed the diet before last visit. No increased urination, blurry vision, nausea, chest pain. Has hip pain (OA) in the hip.  Reviewed HbA1c levels: Lab Results  Component Value Date   HGBA1C 6.7 (A) 08/03/2021   HGBA1C 6.5 (A) 01/28/2021   HGBA1C 6.4 (A) 07/30/2020   Pt is on a regimen of: - Trulicity 1.5 mg weekly. - Metformin 1000 mg 2x a day >> 2000 mg with dinner - Glipizide 2.5 mg before a large dinner We stopped Novolog 70/30 10 units 2x a day in 11/2017, when starting Trulicity.  Pt checks his sugars 1-2 times a day: - am:  99, 110-147, 151, 158, 160 (forgot meds) >> 108-144, 160, 169 (no meds) - 2h after b'fast:150 >> 130, 139 >> 134 >> n/c >> 119-147, 157 > 126-155, 182 - before lunch: 107, 140 >> 106,138, 194 (PB) >> n/c >> 115, 133 >> 135-142 - 2h after lunch: n/c >> 150 >> n/c >> 112-143, 157 >> 144-212, 237 - before dinner: 96-125, 144, 199 (icecream) >> 110 >> n/c >> 152 >> 120-143 - 2h after dinner: 145-157 >> 140-172, 190, 217 >> 102-149, 177 >> 138-201 - bedtime: 82-128, 140 >> 106-140 >> 125-130 >> 110-135 >> 113-179 -  nighttime:51/62, 80-126 >> 129 >> 91 (wine) >> 118 >> n/c Lowest sugar was 28 right after starting insulin >> ...58 >> 99 >> 108; he has hypoglycemia awareness in the 70s. Highest sugar was 166 >> 217 >> 177>> 237.  Glucometer: ReliOn  Pt's meals are: - Breakfast: whole wheat toast + butte - Lunch: tuna salad or tuna sandwich ([employer] delivery), soft taco, meat + baked beans or potatoes - Dinner: [employer] delivery - Snacks: 1-0: nuts, bananas, other fruit He decreased his alcohol intake. He was swimming 5 times a week before the coronavirus pandemic, now exercising at home.  -No CKD; last BUN/creatinine:  Lab Results  Component Value Date   BUN 14 06/18/2021   BUN 10 04/20/2021   CREATININE 0.70 06/18/2021   CREATININE 0.77 04/20/2021  On lisinopril 40.  -+ HL; last set of lipids: Lab Results  Component Value Date   CHOL 100 04/20/2021   HDL 41.40 04/20/2021   LDLCALC 35 04/20/2021   LDLDIRECT 77.0 07/07/2015   TRIG 119.0 04/20/2021   CHOLHDL 2 04/20/2021  On Lipitor 40 and co-Q10.  - last eye exam was in 10/2020: No DR, + cataract. Coming up.  -he has numbness and tingling in his feet  He has a h/o arrhythmia (A fib) - wore an event monitor >> now on beta blocker. Cardiologist: Dr. Acie Fredrickson.  ROS: + See HPI Neurological: no tremors/+ numbness/+ tingling/no dizziness  I reviewed  pt's medications, allergies, PMH, social hx, family hx, and changes were documented in the history of present illness. Otherwise, unchanged from my initial visit note.  Past Medical History:  Diagnosis Date   Anticoagulant long-term use    eliquis--- managed by cardiology   Bladder neoplasm    Diabetes mellitus type [name]    endocrinologist--- [name] --  (06-16-2021 per pt check blood sugar 1-2 times daily,  fasting sugar-- 118-128)   History of basal cell carcinoma (BCC) excision    History of COVID-19 05/2019   per pt mild symptoms that resolved   HLD (hyperlipidemia)    HTN  (hypertension)    Hx of colonic polyps    Nocturia    Paroxysmal atrial fibrillation ([place]) 03/12/2019   cardiologist--- [name]   Ureterocele    Past Surgical History:  Procedure Laterality Date   BLEPHAROPLASTY Bilateral    per pt approx  2001;   upper eyelid's   COLONOSCOPY     last one approx 2020   PATELLAR TENDON REPAIR Left 04/16/2004   @[facility]    TRANSURETHRAL RESECTION OF BLADDER TUMOR N/A 06/18/2021   Procedure: TRANSURETHRAL RESECTION OF BLADDER TUMOR (TURBT) WITH RIGHT URETERAL STENT PLACEMENT, RIGHT URETEROSCOPY WITH DISTRACTION OF TUMOR, FULGURATION;  Surgeon: Festus Aloe, MD;  Location: [facility];  Service: Urology;  Laterality: N/A;   Social History   Socioeconomic History   Marital status: Married    Spouse name: Not on file   Number of children: 2  Social Needs  Occupational History   Occupation: [employer]   Occupation: Cabin crew: Self Employed, retired  Tobacco Use   Smoking status: Former Smoker    Types: Cigars    Last attempt to quit: 02/22/2011   Smokeless tobacco: Never Used   Tobacco comment: 2 cigars monthly or less  Substance and Sexual Activity   Alcohol use: Yes     0.0 oz    Vodka, 2-3 drinks 3x a week   Drugs: No   Current Outpatient Medications on File Prior to Visit  Medication Sig Dispense Refill   atorvastatin (LIPITOR) 40 MG tablet TAKE ONE TABLET BY MOUTH ONCE DAILY 90 tablet 1   b complex vitamins capsule Take 1 capsule by mouth daily.     Dulaglutide (TRULICITY) 1.5 ZO/1.0RU SOPN INJECT 1.5 MG INTO THE SKIN ONCE A WEEK. 2 mL 11   ELIQUIS 5 MG TABS tablet TAKE ONE TABLET BY MOUTH TWICE DAILY 60 tablet 5   glipiZIDE (GLUCOTROL) 5 MG tablet Take 0.5 tablets (2.5 mg total) by mouth daily before supper. 30 tablet 3   glucose blood test strip 1 each by Other route daily. Use daily for glucose control - Dx 250.00 100 each 0   hydrochlorothiazide (MICROZIDE) 12.5 MG capsule TAKE ONE CAPSULE BY MOUTH ONCE  DAILY 90 capsule 1   lisinopril (ZESTRIL) 40 MG tablet TAKE 1 AND 1/2 TABLETS BY MOUTH DAILY (Patient taking differently: Take 40 mg by mouth daily. TAKE 1 AND 1/2 TABLETS BY MOUTH DAILY) 135 tablet 1   metFORMIN (GLUCOPHAGE) 1000 MG tablet TAKE ONE TABLET BY MOUTH TWICE DAILY WITH FOOD 180 tablet 1   metoprolol succinate (TOPROL-XL) 25 MG 24 hr tablet TAKE ONE TABLET BY MOUTH ONCE DAILY 90 tablet 3   Multiple Vitamin (MULTIVITAMIN) tablet Take 1 tablet by mouth daily.     propranolol (INDERAL) 10 MG tablet Take 1 tablet (10 mg total) by mouth 4 (four) times daily as needed (  palpitations, irregular or fast heart rate). 30 tablet 9   sildenafil (REVATIO) 20 MG tablet TAKE 1 TO 2 TABLETS 2 TO 3 HOURS BEFORE SEX 30 tablet 5   No current facility-administered medications on file prior to visit.   No Known Allergies Family History  Problem Relation Age of Onset   Aortic aneurysm Other        family history   Cancer Other        Breast (1st degree relative)   Pancreatic cancer Mother 2   Colon cancer Neg Hx    Rectal cancer Neg Hx    Stomach cancer Neg Hx    Esophageal cancer Neg Hx     PE: There were no vitals taken for this visit. Wt Readings from Last 3 Encounters:  11/23/21 209 lb (94.8 kg)  10/19/21 213 lb (96.6 kg)  08/17/21 209 lb 3.2 oz (94.9 kg)   Constitutional: overweight, in NAD Eyes: PERRLA, EOMI, no exophthalmos ENT: moist mucous membranes, no thyromegaly, no cervical lymphadenopathy Cardiovascular: RRR, No MRG Respiratory: CTA B Musculoskeletal: no deformities, strength intact in all 4 Skin: moist, warm, no rashes Neurological: no tremor with outstretched hands, DTR normal in all 4  ASSESSMENT: 1. DM2, insulin-independent, uncontrolled, with complications - PN - ED  2. HL  3.  Overweight  PLAN:  1. Patient with longstanding, previously uncontrolled type 2 diabetes, on oral antidiabetic regimen with metformin and also sulfonylurea as needed before a larger  late dinner, and also weekly GLP-1 receptor agonist.  Sugars improved after moving metformin at night.  At last visit, due to being busy and traveling and also due to a new diagnosis of bladder cancer, sugars are higher especially in the morning, and especially after having had a late dinner the night before.  Discussed about adding glipizide low-dose before this meal, but otherwise we did not change his regimen.  HbA1c at that time was slightly higher, at 6.7%. -At today's visit, sugars appear to be higher than before at most times of the day.  Some of the morning the 200s.  He did not have low blood sugars, under 108.  He is using his glipizide before dinner more frequently than before.  At this point, since he tolerates Trulicity well, I advised him to increase the dose to 3 mg weekly.  We will continue the rest of the regimen. - I suggested to:  Patient Instructions  Please continue: - Metformin 2000 mg with dinner - Glipizide 2.5 mg before a large or a late dinner  Please increase: - Trulicity 3 mg weekly  Please return in 4-6 months with your sugar log.   - we checked his HbA1c: 7%  - advised to check sugars at different times of the day - 1x a day, rotating check times - advised for yearly eye exams >> he is not UTD - needs a foot exam  - at next OV - return to clinic in 4-6 months  2.  HL -Reviewed latest lipid panel from 03/2021: All fractions at goal: Lab Results  Component Value Date   CHOL 100 04/20/2021   HDL 41.40 04/20/2021   LDLCALC 35 04/20/2021   LDLDIRECT 77.0 07/07/2015   TRIG 119.0 04/20/2021   CHOLHDL 2 04/20/2021  -He is on Lipitor 40 mg daily and co-Q10 without side effects  3.  Overweight -She lost approximately 26 pounds immediately after starting the GLP-1 receptor agonist and after switching to a more plant-based diet -He gained 4 pounds afterwards -Weight  is net stable since last visit -We will continue Trulicity which should also help with weight  loss  [name], MD PhD [facility] Endocrinology

## 2021-12-08 DIAGNOSIS — M25551 Pain in right hip: Secondary | ICD-10-CM | POA: Diagnosis not present

## 2021-12-13 NOTE — Progress Notes (Addendum)
Chronic Care Management Pharmacy Assistant   Name: Robert [name]  MRN: [id number] DOB: 08-16-1951  Reason for Encounter: Medication Review / Medication Coordination    Conditions to be addressed/monitored: HTN  Recent office visits:  None  Recent consult visits:  12/06/21 [name], MD - Patient presented for Type 2 diabetes mellitus without complication without long term current use of insulin and other concerns. Increased Trulicity to 3 mg weekly.   Hospital visits:  Medication Reconciliation was completed by comparing discharge summary, patients EMR and Pharmacy list, and upon discussion with patient.   06/18/2021 [facility] - Patient presented for Transurethral resection of bladder tumor. Patient was there for 5 hours.   Medications that remain the same after Hospital Discharge:??  -All other medications will remain the same.      Medications: Outpatient Encounter Medications as of 12/06/2021  Medication Sig   amoxicillin (AMOXIL) 500 MG capsule Take 500 mg by mouth every 8 (eight) hours.   atorvastatin (LIPITOR) 40 MG tablet TAKE ONE TABLET BY MOUTH ONCE DAILY   b complex vitamins capsule Take 1 capsule by mouth daily.   ELIQUIS 5 MG TABS tablet TAKE ONE TABLET BY MOUTH TWICE DAILY   glipiZIDE (GLUCOTROL) 5 MG tablet Take 0.5 tablets (2.5 mg total) by mouth daily before supper.   glucose blood test strip 1 each by Other route daily. Use daily for glucose control - Dx 250.00   hydrochlorothiazide (MICROZIDE) 12.5 MG capsule TAKE ONE CAPSULE BY MOUTH ONCE DAILY   lisinopril (ZESTRIL) 40 MG tablet TAKE 1 AND 1/2 TABLETS BY MOUTH DAILY (Patient taking differently: Take 40 mg by mouth daily. TAKE 1 AND 1/2 TABLETS BY MOUTH DAILY)   metFORMIN (GLUCOPHAGE) 1000 MG tablet TAKE ONE TABLET BY MOUTH TWICE DAILY WITH FOOD   metoprolol succinate (TOPROL-XL) 25 MG 24 hr tablet TAKE ONE TABLET BY MOUTH ONCE DAILY   Multiple Vitamin (MULTIVITAMIN) tablet  Take 1 tablet by mouth daily.   propranolol (INDERAL) 10 MG tablet Take 1 tablet (10 mg total) by mouth 4 (four) times daily as needed (palpitations, irregular or fast heart rate).   sildenafil (REVATIO) 20 MG tablet TAKE 1 TO 2 TABLETS 2 TO 3 HOURS BEFORE SEX   [DISCONTINUED] Dulaglutide (TRULICITY) 3 QM/0.8QP SOPN Inject 3 mg into the skin once a week.   No facility-administered encounter medications on file as of 12/06/2021.  Reviewed chart prior to disease state call. Spoke with patient regarding BP  Recent Office Vitals: BP Readings from Last 3 Encounters:  12/06/21 120/82  11/23/21 (!) 152/88  10/19/21 (!) 164/98   Pulse Readings from Last 3 Encounters:  12/06/21 70  11/23/21 85  10/19/21 84    Wt Readings from Last 3 Encounters:  12/06/21 204 lb 12.8 oz (92.9 kg)  11/23/21 209 lb (94.8 kg)  10/19/21 213 lb (96.6 kg)     Kidney Function Lab Results  Component Value Date/Time   CREATININE 0.70 06/18/2021 09:04 AM   CREATININE 0.77 04/20/2021 11:02 AM   GFR 90.61 04/20/2021 11:02 AM   GFRNONAA 88 10/21/2019 11:38 AM   GFRAA 101 10/21/2019 11:38 AM    BMP Latest Ref Rng & Units 06/18/2021 04/20/2021 01/28/2021  Glucose 70 - 99 mg/dL 151(H) 116(H) 134(H)  BUN 8 - 23 mg/dL 14 10 9   Creatinine 0.61 - 1.24 mg/dL 0.70 0.77 0.85  BUN/Creat Ratio 10 - 24 - - -  Sodium 135 - 145 mmol/L 137 134(L) 134(L)  Potassium  3.5 - 5.1 mmol/L 4.2 4.0 4.6  Chloride 98 - 111 mmol/L 100 98 99  CO2 19 - 32 mEq/L - 28 30  Calcium 8.4 - 10.5 mg/dL - 9.4 9.6    Current antihypertensive regimen:  Lisinopril 40 mg 1.5 tablets daily  - taking 1 tablet daily Metoprolol succinate 25 mg 1 tablet daily HCTZ 12.5 mg 1 capsule daily How often are you checking your Blood Pressure? weekly Current home BP readings: 120/82 ( office 12/07/21) What recent interventions/DTPs have been made by any provider to improve Blood Pressure control since last CPP Visit: Patient reports none with BP; Increase on  Trulicity Any recent hospitalizations or ED visits since last visit with CPP? No  Adherence Review: Is the patient currently on ACE/ARB medication? Yes Does the patient have >5 day gap between last estimated fill dates? No   Reviewed chart for medication changes ahead of medication coordination call.  No OVs, Consults, or hospital visits since last care coordination call/Pharmacist visit.  No medication changes indicated.  BP Readings from Last 3 Encounters:  12/06/21 120/82  11/23/21 (!) 152/88  10/19/21 (!) 164/98    Lab Results  Component Value Date   HGBA1C 7.6 (A) 12/06/2021     Patient obtains medications through Vials  30 Days   Last adherence delivery included:  Hydrochlorothiazide (MICROZIDE) 12.5 MG capsule: one daily Metoprolol ER 25 mg : take one tab daily Metformin HCL 1,000 mg: one tablet twice daily at bedtime Atorvastatin 40 mg: one tablet daily at bedtime Eliquis 5 mg : take one tab twice daily   Patient declined the following medications:   Lisinopril 40 mg: one and a half tablet daily -patent has an abundance on hand   Trulicity: Inject 1.5 mg into the skin once a week - patient reports he called to have this sent on 11/15/21 for a 4 week supply as his last one froze and he could not use    Patient is due for next adherence delivery on: 12/23/21. Called patient and reviewed medications and coordinated delivery. Confirmed Vials no safety caps 30 DS  This delivery to include:             Hydrochlorothiazide (MICROZIDE) 12.5 MG capsule: take one capsule daily Metoprolol ER 25 mg : take one tab daily Metformin HCL 1,000 mg: one tablet twice daily Atorvastatin 40 mg: one tablet daily at bedtime Eliquis 5 mg : take one tab twice daily                        ([facility] Out [facility] pt confirms receipt) Trulicity 1.5 LO/7.5 mL subcutaneous pen injector 11/15/2021 28    Confirmed delivery date of 12/23/21, advised patient that pharmacy  will contact them the morning of delivery.   Care Gaps: Hepatitis C Screening - Overdue Zoster Vaccine - Overdue Foot Exam - Overdue COVID Booster - Overdue Eye Exam - Overdue BP- 120/8 ( 12/06/21) AWV- 6/22 CCM- 11/23 Lab Results  Component Value Date   HGBA1C 7.6 (A) 12/06/2021    Star Rating Drugs: Atorvastatin 40 mg - Last filled 11/17/2021 30 DS at [facility] Metformin 1000 mg - Last filled 11/17/2328 DS at [facility] Lisinopril 40 mg - Last filled - 10/20/2021 30 DS at [facility] (Pt has been declining medication fill) Duaglutide 1.5 mg -Last [facility]21 [facility] Glipizide (Glucotrol) 5 mg - No fill history was prescribed 08/03/21    [place] Pharmacist Assistant  [phone] ° °

## 2021-12-20 NOTE — Progress Notes (Signed)
° °  I, Peterson Lombard, LAT, ATC acting as a scribe for Lynne Leader, MD.  Robert [name] is a 71 y.o. male who presents to [facility] today for f/u R hip pain thought to be due to GT bursitis and hip arthritis. Of note, pt cares for his wife, suffering from dementia. Pt was last seen by Dr. [name] on 11/23/21 and was given a R hip steroid injection and was advised to cont HEP. Today, pt reports the steroid injection was helpful. He had a very painful day yesterday, but today it's not as bad.   Dx imaging: 10/19/21 R hip XR 10/28/19 R hip XR  Pertinent review of systems: No fevers or chills  Relevant historical information: Diabetes.  His wife has dementia and he provides her care.   Exam:  BP (!) 144/80    Pulse (!) 101    Ht 6\' 3"  (1.905 m)    Wt 203 lb 12.8 oz (92.4 kg)    SpO2 96%    BMI 25.47 kg/m  General: Well Developed, well nourished, and in no acute distress.   MSK: Right hip decreased range of motion.    Lab and Radiology Results EXAM: DG HIP (WITH OR WITHOUT PELVIS) 2-3V RIGHT   COMPARISON:  10/28/2019   FINDINGS: Frontal view of the pelvis as well as frontal and frogleg lateral views of the right hip are obtained. No fracture, subluxation, or dislocation within either hip. Progressive bilateral hip osteoarthritis, right greater than left. Sacroiliac joints are unremarkable. Stable lower lumbar spondylosis.   IMPRESSION: 1. Progressive bilateral hip osteoarthritis, right greater than left. 2. No acute bony abnormalities. 3. Stable lower lumbar spondylosis.     Electronically Signed   By: Randa Ngo M.D.   On: 10/19/2021 22:37 I, Lynne Leader, personally (independently) visualized and performed the interpretation of the images attached in this note.      Assessment and Plan: 71 y.o. male with right hip pain due to DJD. Patient did experience benefit with steroid injection which still is ongoing but starting to wear off  now.  We discussed that ultimately I think he will require total hip replacement. He can let me know when he is ready.  We will have be happy to place referral to orthopedic surgery.  He does have some care arranged for his wife while he would be out with his hip.  Total encounter time 20 minutes including face-to-face time with the patient and, reviewing past medical record, and charting on the date of service.   Treatment plan and options   Discussed warning signs or symptoms. Please see discharge instructions. Patient expresses understanding.   The above documentation has been reviewed and is accurate and complete Lynne Leader, M.D.

## 2021-12-21 ENCOUNTER — Encounter: Payer: Self-pay | Admitting: Internal Medicine

## 2021-12-21 ENCOUNTER — Other Ambulatory Visit: Payer: Self-pay

## 2021-12-21 ENCOUNTER — Ambulatory Visit: Payer: Medicare HMO | Admitting: Family Medicine

## 2021-12-21 VITALS — BP 144/80 | HR 101 | Ht 75.0 in | Wt 203.8 lb

## 2021-12-21 DIAGNOSIS — M25551 Pain in right hip: Secondary | ICD-10-CM

## 2021-12-21 DIAGNOSIS — M1611 Unilateral primary osteoarthritis, right hip: Secondary | ICD-10-CM

## 2021-12-21 NOTE — Patient Instructions (Addendum)
Thank you for coming in today.   Let me know when it makes sense for you to proceed to meeting with a surgeon.  Recheck as needed

## 2021-12-24 DIAGNOSIS — E1136 Type 2 diabetes mellitus with diabetic cataract: Secondary | ICD-10-CM | POA: Diagnosis not present

## 2021-12-24 DIAGNOSIS — E119 Type 2 diabetes mellitus without complications: Secondary | ICD-10-CM | POA: Diagnosis not present

## 2021-12-24 DIAGNOSIS — H5201 Hypermetropia, right eye: Secondary | ICD-10-CM | POA: Diagnosis not present

## 2021-12-24 DIAGNOSIS — H2513 Age-related nuclear cataract, bilateral: Secondary | ICD-10-CM | POA: Diagnosis not present

## 2021-12-24 DIAGNOSIS — H25013 Cortical age-related cataract, bilateral: Secondary | ICD-10-CM | POA: Diagnosis not present

## 2021-12-24 LAB — HM DIABETES EYE EXAM

## 2021-12-28 ENCOUNTER — Encounter: Payer: Self-pay | Admitting: Internal Medicine

## 2022-01-11 ENCOUNTER — Other Ambulatory Visit: Payer: Self-pay | Admitting: Internal Medicine

## 2022-01-11 DIAGNOSIS — E119 Type 2 diabetes mellitus without complications: Secondary | ICD-10-CM

## 2022-01-12 ENCOUNTER — Telehealth: Payer: Self-pay | Admitting: Pharmacist

## 2022-01-12 NOTE — Chronic Care Management (AMB) (Addendum)
? ? ?Chronic Care Management ?Pharmacy Assistant  ? ?Name: Robert [name]  MRN: [id number] DOB: 1950-12-01 ? ?Reason for Encounter: Medication Review Medication Coordination ?  ?Conditions to be addressed/monitored: ?DMII ? ?Recent office visits:  ?None ? ?Recent consult visits:  ?2/28/223 [name], MD ([facility]) - Patient presented for right hip pain and other concerns. No medication changes. ? ?12/08/21 Latanya Maudlin A ([facility]) - Patient presented for right hip pain. No other visit details available. ? ?Hospital visits:  ?None in previous 6 months ? ?Medications: ?Outpatient Encounter Medications as of 01/12/2022  ?Medication Sig  ? amoxicillin (AMOXIL) 500 MG capsule Take 500 mg by mouth every 8 (eight) hours. (Patient not taking: Reported on 12/21/2021)  ? atorvastatin (LIPITOR) 40 MG tablet TAKE ONE TABLET BY MOUTH ONCE DAILY  ? b complex vitamins capsule Take 1 capsule by mouth daily.  ? Dulaglutide (TRULICITY) 3 FU/9.3AT SOPN Inject 3 mg into the skin once a week.  ? ELIQUIS 5 MG TABS tablet TAKE ONE TABLET BY MOUTH TWICE DAILY  ? glipiZIDE (GLUCOTROL) 5 MG tablet Take 0.5 tablets (2.5 mg total) by mouth daily before supper.  ? glucose blood test strip 1 each by Other route daily. Use daily for glucose control - Dx 250.00  ? hydrochlorothiazide (MICROZIDE) 12.5 MG capsule TAKE ONE CAPSULE BY MOUTH ONCE DAILY  ? lisinopril (ZESTRIL) 40 MG tablet TAKE 1 AND 1/2 TABLETS BY MOUTH DAILY (Patient taking differently: Take 40 mg by mouth daily. TAKE 1 AND 1/2 TABLETS BY MOUTH DAILY)  ? metFORMIN (GLUCOPHAGE) 1000 MG tablet TAKE ONE TABLET BY MOUTH TWICE DAILY with food  ? metoprolol succinate (TOPROL-XL) 25 MG 24 hr tablet TAKE ONE TABLET BY MOUTH ONCE DAILY  ? Multiple Vitamin (MULTIVITAMIN) tablet Take 1 tablet by mouth daily.  ? propranolol (INDERAL) 10 MG tablet Take 1 tablet (10 mg total) by mouth 4 (four) times daily as needed (palpitations, irregular or fast heart rate).  ? sildenafil  (REVATIO) 20 MG tablet TAKE 1 TO 2 TABLETS 2 TO 3 HOURS BEFORE SEX  ? ?No facility-administered encounter medications on file as of 01/12/2022.  ? ? ?Recent Relevant Labs: ?Lab Results  ?Component Value Date/Time  ? HGBA1C 7.6 (A) 12/06/2021 10:01 AM  ? HGBA1C 6.7 (A) 08/03/2021 09:31 AM  ? HGBA1C 7.6 (H) 10/04/2017 12:06 PM  ? HGBA1C 7.7 (H) 07/05/2017 08:34 AM  ? MICROALBUR 4.0 (H) 01/28/2021 09:25 AM  ? MICROALBUR 0.9 07/23/2018 12:20 PM  ?  ?Kidney Function ?Lab Results  ?Component Value Date/Time  ? CREATININE 0.70 06/18/2021 09:04 AM  ? CREATININE 0.77 04/20/2021 11:02 AM  ? GFR 90.61 04/20/2021 11:02 AM  ? GFRNONAA 88 10/21/2019 11:38 AM  ? GFRAA 101 10/21/2019 11:38 AM  ? ? ?Current antihyperglycemic regimen:  ?Glipizide 5 mg 1/2 tablet as needed for large meals Trulicity 1.5 mg inject once weekly ?Metformin 1000 mg 1 tablet twice daily with food ?What recent interventions/DTPs have been made to improve glycemic control:  ?Patient reports no changes ?Have there been any recent hospitalizations or ED visits since last visit with CPP? No ?Patient denies hypoglycemic symptoms, including None ?Patient denies hyperglycemic symptoms, including none ?How often are you checking your blood sugar? once daily ?What are your blood sugars ranging?  ?Fasting: 111, 122, 116 on today ?During the week, how often does your blood glucose drop below 70? Never ? ?Adherence Review: ?Is the patient currently on a STATIN medication? Yes ?Is the patient currently on  ACE/ARB medication? Yes ?Does the patient have >5 day gap between last estimated fill dates? No ? ? ? ? ? ? ?BP Readings from Last 3 Encounters:  ?12/21/21 (!) 144/80  ?12/06/21 120/82  ?11/23/21 (!) 152/88  ?  ?Lab Results  ?Component Value Date  ? HGBA1C 7.6 (A) 12/06/2021  ?  ? ?Patient obtains medications through Vials  30 Days  ? ?Last adherence delivery included: ? Hydrochlorothiazide (MICROZIDE) 12.5 MG : take one capsule daily ?Metoprolol ER 25 mg : take one tab  daily ?Metformin HCL 1,000 mg: one tablet twice daily ?Atorvastatin 40 mg: one tablet daily at bedtime ?Eliquis 5 mg : take one tab twice daily ? Lisinopril 40 mg: take one and a half tabs daily (Pt taking only 1 PCP/MP aware) ?  ([facility] pt confirms receipt) ?Trulicity 1.5 YI/5.0 mL subcutaneous pen injector 11/15/2021 28  ?  ?  ?Patient is due for next adherence delivery on: 01/24/22. ?Called patient and reviewed medications and coordinated delivery. ?Confirmed Vials for 30 DS ? ?This delivery to include: ?Hydrochlorothiazide 12.5 MG : take one capsule daily ?Metoprolol ER 25 mg : take one tab daily ?Metformin HCL 1,000 mg: one tablet twice daily ?Atorvastatin 40 mg: one tablet daily at bedtime ?Eliquis 5 mg : take one tab twice daily ? Lisinopril 40 mg: take one and a half tabs daily (Pt taking only 1 PCP/MP aware) ?[facility] ? ? ?Patient declined: ?Lisinopril 40 mg: take one and a half tabs daily (Pt taking only 1 PCP/MP aware) Due to on hand abundance ?Test Strips Due to abundance ?Lancets Due to abundance ? ?Added to delivery: ?Glipizide, pt using as needed when he has a large meal, call to Endo and requested script be sent to [facility] ? ? Confirmed delivery date of 01/24/22,  advised patient that pharmacy will contact him the morning of delivery.  ? ? ?Care Gaps: ?Hepatitis C Screen - Overdue ?Zoster Vaccine - Overdue ?Foot Exam - Overdue ?COVID Booster - Overdue ?BP- 144/80 (12/21/21) 120/82 (12/06/21) ?AWV- 6/22 ?CCM Pharm- 11/23 cancelled as pt switching offices he is aware to reschedule with new Pharmacist ?Lab Results  ?Component Value Date  ? HGBA1C 7.6 (A) 12/06/2021  ? ? ?Star Rating Drugs: ?Atorvastatin 40 mg - Last filled 12/16/2021 30 DS at [facility] ?Metformin 1000 mg - Last filled 12/16/21 30 DS at [facility] ?Lisinopril 40 mg - Last filled - 10/20/2021 30 DS at [facility] (Pt has been declining medication fill only taking 1 tab not as  directed) ?Duaglutide 1.5 mg -Last filled 01/03/22 28 [facility] ?Glipizide (Glucotrol) 5 mg - Requested on 01/14/22 to be sent to [facility]  ? ? ?Patient Assistance: ?None  ? ?Ned Clines CMA ?Clinical Pharmacist Assistant ?[phone] ? ?

## 2022-01-14 ENCOUNTER — Other Ambulatory Visit: Payer: Self-pay

## 2022-01-14 DIAGNOSIS — E119 Type 2 diabetes mellitus without complications: Secondary | ICD-10-CM

## 2022-01-14 MED ORDER — GLIPIZIDE 5 MG PO TABS: 2.5000 mg | ORAL_TABLET | Freq: Every day | ORAL | 3 refills | Status: DC

## 2022-01-28 ENCOUNTER — Encounter: Payer: Self-pay | Admitting: Internal Medicine

## 2022-01-31 ENCOUNTER — Ambulatory Visit (INDEPENDENT_AMBULATORY_CARE_PROVIDER_SITE_OTHER): Payer: Medicare HMO | Admitting: Internal Medicine

## 2022-01-31 ENCOUNTER — Encounter: Payer: Self-pay | Admitting: Internal Medicine

## 2022-01-31 DIAGNOSIS — E119 Type 2 diabetes mellitus without complications: Secondary | ICD-10-CM

## 2022-01-31 DIAGNOSIS — C679 Malignant neoplasm of bladder, unspecified: Secondary | ICD-10-CM | POA: Diagnosis not present

## 2022-01-31 DIAGNOSIS — I10 Essential (primary) hypertension: Secondary | ICD-10-CM

## 2022-01-31 DIAGNOSIS — Z7901 Long term (current) use of anticoagulants: Secondary | ICD-10-CM | POA: Diagnosis not present

## 2022-01-31 DIAGNOSIS — I480 Paroxysmal atrial fibrillation: Secondary | ICD-10-CM

## 2022-01-31 NOTE — Patient Instructions (Signed)
Cardiac CT calcium scoring test $99 °Tel # is [phone] ° ° °Computed tomography, more commonly known as a CT or CAT scan, is a diagnostic medical imaging test. Like traditional x-rays, it produces multiple images or pictures of the inside of the body. °The cross-sectional images generated during a CT scan can be reformatted in multiple planes. They can even generate three-dimensional images. These images can be viewed on a computer monitor, printed on film or by a 3D printer, or transferred to a CD or DVD. °CT images of internal organs, bones, soft tissue and blood vessels provide greater detail than traditional x-rays, particularly of soft tissues and blood vessels. °A cardiac CT scan for coronary calcium is a non-invasive way of obtaining information about the presence, location and extent of calcified plaque in the coronary arteries--the vessels that supply oxygen-containing blood to the heart muscle. Calcified plaque results when there is a build-up of fat and other substances under the inner layer of the artery. This material can calcify which signals the presence of atherosclerosis, a disease of the vessel wall, also called coronary artery disease (CAD). People with this disease have an increased risk for heart attacks. In addition, over time, progression of plaque build up (CAD) can narrow the arteries or even close off blood flow to the heart. The result may be chest pain, sometimes called "angina," or a heart attack. °Because calcium is a marker of CAD, the amount of calcium detected on a cardiac CT scan is a helpful prognostic tool. The findings on cardiac CT are expressed as a calcium score. Another name for this test is coronary artery calcium scoring. ° °What are some common uses of the procedure? °The goal of cardiac CT scan for calcium scoring is to determine if CAD is present and to what extent, even if there are no symptoms. It is a screening study that may be recommended by a physician for  patients with risk factors for CAD but no clinical symptoms. °The major risk factors for CAD are: °high blood cholesterol levels  °family history of heart attacks  °diabetes  °high blood pressure  °cigarette smoking  °overweight or obese  °physical inactivity ° ° °A negative cardiac CT scan for calcium scoring shows no calcification within the coronary arteries. This suggests that CAD is absent or so minimal it cannot be seen by this technique. The chance of having a heart attack over the next two to five years is very low under these circumstances. °A positive test means that CAD is present, regardless of whether or not the patient is experiencing any symptoms. The amount of calcification--expressed as the calcium score--may help to predict the likelihood of a myocardial infarction (heart attack) in the coming years and helps your medical doctor or cardiologist decide whether the patient may need to take preventive medicine or undertake other measures such as diet and exercise to lower the risk for heart attack. °The extent of CAD is graded according to your calcium score: ° °Calcium Score  Presence of CAD (coronary artery disease)  °0 No evidence of CAD   °1-10 Minimal evidence of CAD  °11-100 Mild evidence of CAD  °101-400 Moderate evidence of CAD  °Over 400 Extensive evidence of CAD  ° °Coronary artery calcium (CAC) score is a strong predictor of °incident coronary heart disease (CHD) and provides predictive °information beyond traditional risk factors. CAC scoring is °reasonable to use in the decision to withhold, postpone, or initiate °statin therapy in intermediate-risk or selected borderline-risk °asymptomatic   adults (age 40-75 years and LDL-C >=70 to <190 mg/dL) °who do not have diabetes or established atherosclerotic °cardiovascular disease (ASCVD).* In intermediate-risk (10-year ASCVD °risk >=7.5% to <20%) adults or selected borderline-risk (10-year °ASCVD risk >=5% to <7.5%) adults in whom a CAC score is  measured for °the purpose of making a treatment decision the following °recommendations have been made: °  °If CAC=0, it is reasonable to withhold statin therapy and reassess °in 5 to 10 years, as long as higher risk conditions are absent °(diabetes mellitus, family history of premature CHD in first degree °relatives (males <55 years; females <65 years), cigarette smoking, °or LDL >=190 mg/dL). °  °If CAC is 1 to 99, it is reasonable to initiate statin therapy for °patients >=55 years of age. °  °If CAC is >=100 or >=75th percentile, it is reasonable to initiate °statin therapy at any age. °  °Cardiology referral should be considered for patients with CAC °scores >=400 or >=75th percentile. °  °*2018 AHA/ACC/AACVPR/AAPA/ABC/ACPM/ADA/AGS/APhA/ASPC/NLA/PCNA °Guideline on the Management of Blood Cholesterol: A Report of the °American College of Cardiology/American Heart Association Task Force °on Clinical Practice Guidelines. J Am Coll Cardiol. °2019;73(24):3168-3209. ° °

## 2022-01-31 NOTE — Assessment & Plan Note (Signed)
On Eliquis

## 2022-01-31 NOTE — Assessment & Plan Note (Addendum)
BP Readings from Last 3 Encounters:  ?01/31/22 118/68  ?12/21/21 (!) 144/80  ?12/06/21 120/82  ? ?Continue with HCTZ, lisinopril, Toprol ?

## 2022-01-31 NOTE — Assessment & Plan Note (Addendum)
On Eliquis ?Continue with  Toprol ?Propranolol was given previously to use as needed ?

## 2022-01-31 NOTE — Assessment & Plan Note (Signed)
Dr Junious Silk - cystoscopic surgery, tuberculine ?

## 2022-01-31 NOTE — Progress Notes (Signed)
? ?Subjective:  ?Patient ID: Robert [name], male    DOB: 03-24-1951  Age: 71 y.o. MRN: [id number] ? ?CC: No chief complaint on file. ? ? 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 ?Lost 45 lbs 2 years ago on diet ? ?Outpatient Medications Prior to Visit  ?Medication Sig Dispense Refill  ? atorvastatin (LIPITOR) 40 MG tablet TAKE ONE TABLET BY MOUTH ONCE DAILY 90 tablet 1  ? b complex vitamins capsule Take 1 capsule by mouth daily.    ? Dulaglutide (TRULICITY) 3 PN/3.6RW SOPN Inject 3 mg into the skin once a week. 6 mL 3  ? ELIQUIS 5 MG TABS tablet TAKE ONE TABLET BY MOUTH TWICE DAILY 60 tablet 5  ? glipiZIDE (GLUCOTROL) 5 MG tablet Take 0.5 tablets (2.5 mg total) by mouth daily before supper. 45 tablet 3  ? glucose blood test strip 1 each by [name] route daily. Use daily for glucose control - Dx 250.00 100 each 0  ? hydrochlorothiazide (MICROZIDE) 12.5 MG capsule TAKE ONE CAPSULE BY MOUTH ONCE DAILY 90 capsule 1  ? lisinopril (ZESTRIL) 40 MG tablet TAKE 1 AND 1/2 TABLETS BY MOUTH DAILY (Patient taking differently: Take 40 mg by mouth daily. TAKE 1 AND 1/2 TABLETS BY MOUTH DAILY) 135 tablet 1  ? metFORMIN (GLUCOPHAGE) 1000 MG tablet TAKE ONE TABLET BY MOUTH TWICE DAILY with food 180 tablet 0  ? metoprolol succinate (TOPROL-XL) 25 MG 24 hr tablet TAKE ONE TABLET BY MOUTH ONCE DAILY 90 tablet 3  ? Multiple Vitamin (MULTIVITAMIN) tablet Take 1 tablet by mouth daily.    ? propranolol (INDERAL) 10 MG tablet Take 1 tablet (10 mg total) by mouth 4 (four) times daily as needed (palpitations, irregular or fast heart rate). 30 tablet 9  ? sildenafil (REVATIO) 20 MG tablet TAKE 1 TO 2 TABLETS 2 TO 3 HOURS BEFORE SEX 30 tablet 5  ? amoxicillin (AMOXIL) 500 MG capsule Take 500 mg by mouth every 8 (eight) hours. (Patient not taking: Reported on 12/21/2021)    ? ?No facility-administered medications prior to visit.  ? ? ?ROS: ?Review of Systems  ?Constitutional:  Negative for  appetite change, fatigue and unexpected weight change.  ?HENT:  Negative for congestion, nosebleeds, sneezing, sore throat and trouble swallowing.   ?Eyes:  Negative for itching and visual disturbance.  ?Respiratory:  Negative for cough.   ?Cardiovascular:  Negative for chest pain, palpitations and leg swelling.  ?Gastrointestinal:  Negative for abdominal distention, blood in stool, diarrhea and nausea.  ?Genitourinary:  Negative for frequency and hematuria.  ?Musculoskeletal:  Negative for back pain, gait problem, joint swelling and neck pain.  ?Skin:  Negative for rash.  ?Neurological:  Negative for dizziness, tremors, speech difficulty and weakness.  ?Psychiatric/Behavioral:  Negative for agitation and sleep disturbance. The patient is not nervous/anxious.   ? ?Objective:  ?BP 118/68 (BP Location: Left Arm, Patient Position: Sitting, Cuff Size: Large)   Pulse 68   Temp 98.6 ?F (37 ?C) (Oral)   Ht '6\' 3"'$  (1.905 m)   Wt 209 lb (94.8 kg)   SpO2 98%   BMI 26.12 kg/m?  ? ?BP Readings from Last 3 Encounters:  ?01/31/22 118/68  ?12/21/21 (!) 144/80  ?12/06/21 120/82  ? ? ?Wt Readings from Last 3 Encounters:  ?01/31/22 209 lb (94.8 kg)  ?12/21/21 203 lb 12.8 oz (92.4 kg)  ?12/06/21 204 lb 12.8 oz (92.9 kg)  ? ? ?Physical  Exam ?Constitutional:   ?   General: He is not in acute distress. ?   Appearance: He is well-developed.  ?   Comments: NAD  ?Eyes:  ?   Conjunctiva/sclera: Conjunctivae normal.  ?   Pupils: Pupils are equal, round, and reactive to light.  ?Neck:  ?   Thyroid: No thyromegaly.  ?   Vascular: No JVD.  ?Cardiovascular:  ?   Rate and Rhythm: Normal rate and regular rhythm.  ?   Heart sounds: Normal heart sounds. No murmur heard. ?  No friction rub. No gallop.  ?Pulmonary:  ?   Effort: Pulmonary effort is normal. No respiratory distress.  ?   Breath sounds: Normal breath sounds. No wheezing or rales.  ?Chest:  ?   Chest wall: No tenderness.  ?Abdominal:  ?   General: Bowel sounds are normal. There is no  distension.  ?   Palpations: Abdomen is soft. There is no mass.  ?   Tenderness: There is no abdominal tenderness. There is no guarding or rebound.  ?Musculoskeletal:     ?   General: No tenderness. Normal range of motion.  ?   Cervical back: Normal range of motion.  ?Lymphadenopathy:  ?   Cervical: No cervical adenopathy.  ?Skin: ?   General: Skin is warm and dry.  ?   Findings: No rash.  ?Neurological:  ?   Mental Status: He is alert and oriented to person, place, and time.  ?   Cranial Nerves: No cranial nerve deficit.  ?   Motor: No abnormal muscle tone.  ?   Coordination: Coordination normal.  ?   Gait: Gait normal.  ?   Deep Tendon Reflexes: Reflexes are normal and symmetric.  ?Psychiatric:     ?   Behavior: Behavior normal.     ?   Thought Content: Thought content normal.     ?   Judgment: Judgment normal.  ? ? ? ? A total time of 45 minutes was spent preparing to see the patient, reviewing tests, x-rays, operative reports and [name] medical records.  Also, obtaining history and performing comprehensive physical exam.  Additionally, counseling the patient regarding the above listed issues.   Finally, documenting clinical information in the health records, coordination of care, educating the patient regarding atrial fibrillation, diabetes, hypertension.  It is a complex case to establish a new care with me. ? ? ?Lab Results  ?Component Value Date  ? WBC 9.4 04/20/2021  ? HGB 16.0 06/18/2021  ? HCT 47.0 06/18/2021  ? PLT 177.0 04/20/2021  ? GLUCOSE 151 (H) 06/18/2021  ? CHOL 100 04/20/2021  ? TRIG 119.0 04/20/2021  ? HDL 41.40 04/20/2021  ? LDLDIRECT 77.0 07/07/2015  ? [place] 35 04/20/2021  ? ALT 15 04/20/2021  ? AST 14 04/20/2021  ? NA 137 06/18/2021  ? K 4.2 06/18/2021  ? CL 100 06/18/2021  ? CREATININE 0.70 06/18/2021  ? BUN 14 06/18/2021  ? CO2 28 04/20/2021  ? TSH 1.18 04/20/2021  ? PSA 2.83 07/23/2018  ? HGBA1C 7.6 (A) 12/06/2021  ? MICROALBUR 4.0 (H) 01/28/2021  ? ? ?No results found. ? ?Assessment &  Plan:  ? ?Problem List Items Addressed This Visit   ? ? Essential hypertension  ?  BP Readings from Last 3 Encounters:  ?01/31/22 118/68  ?12/21/21 (!) 144/80  ?12/06/21 120/82  ? ?  ?  ? Long term current use of anticoagulant -Eliquis  ?  On Eliquis ?  ?  ? Paroxysmal  atrial fibrillation ([place])  ?  On Eliquis ?  ?  ? Bladder cancer ([place])  ?  Dr Junious Silk - cystoscopic surgery, tuberculine ?  ?  ?  ? ? ?No orders of the defined types were placed in this encounter. ?  ? ? ?Follow-up: Return in about 6 months (around 08/02/2022) for Wellness Exam. ? ?Walker Kehr, MD ?

## 2022-02-15 ENCOUNTER — Other Ambulatory Visit: Payer: Self-pay | Admitting: Internal Medicine

## 2022-02-15 DIAGNOSIS — E785 Hyperlipidemia, unspecified: Secondary | ICD-10-CM

## 2022-02-16 ENCOUNTER — Other Ambulatory Visit: Payer: Self-pay | Admitting: Internal Medicine

## 2022-02-16 DIAGNOSIS — E785 Hyperlipidemia, unspecified: Secondary | ICD-10-CM

## 2022-02-18 ENCOUNTER — Other Ambulatory Visit: Payer: Self-pay | Admitting: Internal Medicine

## 2022-02-18 DIAGNOSIS — E785 Hyperlipidemia, unspecified: Secondary | ICD-10-CM

## 2022-02-20 ENCOUNTER — Encounter: Payer: Self-pay | Admitting: Internal Medicine

## 2022-02-20 NOTE — Assessment & Plan Note (Signed)
Continue with Trulicity and glipizide ?

## 2022-02-20 NOTE — Assessment & Plan Note (Signed)
Continue with atorvastatin ?Obtain lipids ?

## 2022-02-23 DIAGNOSIS — C678 Malignant neoplasm of overlapping sites of bladder: Secondary | ICD-10-CM | POA: Diagnosis not present

## 2022-02-24 ENCOUNTER — Telehealth: Payer: Self-pay

## 2022-02-24 ENCOUNTER — Telehealth: Payer: Self-pay | Admitting: Cardiovascular Disease

## 2022-02-24 ENCOUNTER — Other Ambulatory Visit: Payer: Self-pay | Admitting: Urology

## 2022-02-24 NOTE — Telephone Encounter (Signed)
?  Patient Consent for Virtual Visit  ? ? ?    ? ?Robert [name] has provided verbal consent on 02/24/2022 for a virtual visit (video or telephone). ? ? ?CONSENT FOR VIRTUAL VISIT FOR:  Robert [name]  ?By participating in this virtual visit I agree to the following: ? ?I hereby voluntarily request, consent and authorize [facility] and its employed or contracted physicians, physician assistants, nurse practitioners or other licensed health care professionals (the Practitioner), to provide me with telemedicine health care services (the ?Services") as deemed necessary by the treating Practitioner. I acknowledge and consent to receive the Services by the Practitioner via telemedicine. I understand that the telemedicine visit will involve communicating with the Practitioner through live audiovisual communication technology and the disclosure of certain medical information by electronic transmission. I acknowledge that I have been given the opportunity to request an in-person assessment or other available alternative prior to the telemedicine visit and am voluntarily participating in the telemedicine visit. ? ?I understand that I have the right to withhold or withdraw my consent to the use of telemedicine in the course of my care at any time, without affecting my right to future care or treatment, and that the Practitioner or I may terminate the telemedicine visit at any time. I understand that I have the right to inspect all information obtained and/or recorded in the course of the telemedicine visit and may receive copies of available information for a reasonable fee.  I understand that some of the potential risks of receiving the Services via telemedicine include:  ?Delay or interruption in medical evaluation due to technological equipment failure or disruption; ?Information transmitted may not be sufficient (e.g. poor resolution of images) to allow for appropriate medical decision making by the  Practitioner; and/or  ?In rare instances, security protocols could fail, causing a breach of personal health information. ? ?Furthermore, I acknowledge that it is my responsibility to provide information about my medical history, conditions and care that is complete and accurate to the best of my ability. I acknowledge that Practitioner's advice, recommendations, and/or decision may be based on factors not within their control, such as incomplete or inaccurate data provided by me or distortions of diagnostic images or specimens that may result from electronic transmissions. I understand that the practice of medicine is not an exact science and that Practitioner makes no warranties or guarantees regarding treatment outcomes. I acknowledge that a copy of this consent can be made available to me via my patient portal ([facility]), or I can request a printed copy by calling the office of [facility].   ? ?I understand that my insurance will be billed for this visit.  ? ?I have read or had this consent read to me. ?I understand the contents of this consent, which adequately explains the benefits and risks of the Services being provided via telemedicine.  ?I have been provided ample opportunity to ask questions regarding this consent and the Services and have had my questions answered to my satisfaction. ?I give my informed consent for the services to be provided through the use of telemedicine in my medical care ? ? ? ?

## 2022-02-24 NOTE — Telephone Encounter (Signed)
Pharmacy please advise on holding instructions for Eliquis.Patient is scheduled for transurethral Resection of bladder tumor with bilateral Urethroscopy.  Patient's last office visit was 08/17/2022.   ?

## 2022-02-24 NOTE — Telephone Encounter (Signed)
? ? ?  Name: Robert [name]  ?DOB: Sep 30, 1951  ?MRN: [id number] ? ?Primary Cardiologist: Mertie Moores, MD ? ? ?Preoperative team, please contact this patient and set up a phone call appointment for further preoperative risk assessment. Please obtain consent and complete medication review. Thank you for your help. ? ?Per office protocol, patient can hold Eliquis for 3 days prior to procedure as requested.   ? ?I confirm that guidance regarding antiplatelet and oral anticoagulation therapy has been completed and, if necessary, noted below. ? ? ? ?Mable Fill, Marissa Nestle, NP ?02/24/2022, 3:46 PM ?[address] ?[address] ?[place], [place] [address] ?  ?

## 2022-02-24 NOTE — Telephone Encounter (Signed)
Patient with diagnosis of afib on Eliquis for anticoagulation.   ? ?Procedure: Transurethral Resection of bladder tumor with bilateral Uretheroscopy ?Date of procedure: TBD ? ?CHA2DS2-VASc Score = 3  ?This indicates a 3.2% annual risk of stroke. ?The patient's score is based upon: ?CHF History: 0 ?HTN History: 1 ?Diabetes History: 1 ?Stroke History: 0 ?Vascular Disease History: 0 ?Age Score: 1 ?Gender Score: 0 ?  ?CrCl >147m/min ?Platelet count 177K ? ?Per office protocol, patient can hold Eliquis for 3 days prior to procedure as requested.   ? ?

## 2022-02-24 NOTE — Telephone Encounter (Signed)
? ?  Pre-operative Risk Assessment  ?  ?Patient Name: Robert [name]  ?DOB: 1950-10-31 ?MRN: [id number]  ? ?  ? ?Request for Surgical Clearance   ? ?Procedure:  Transurethral Resection of bladder tumor with bilateral Uretheroscopy ? ?Date of Surgery:  Clearance     Pending asap                           ?   ?Surgeon:  Dr Festus Aloe ?Surgeon's Group or Practice Name:   ?Phone number:  [phone] ?Fax number:  [phone] ?  ?Type of Clearance Requested:   ?- Medical  ?- Pharmacy:  Hold Apixaban (Eliquis) can patient hold for 3 days prior to surgery ?  ?Type of Anesthesia:  General  ?  ?Additional requests/questions:   ? ?Signed, ?Glyn Ade   ?02/24/2022, 3:02 PM  ? ?

## 2022-02-24 NOTE — Telephone Encounter (Signed)
Patient has been contacted and telehealth appt scheduled.  ? ?Medication list and consent completed.  ? ?Patient agreeable and voiced understanding. ?

## 2022-02-25 ENCOUNTER — Ambulatory Visit (INDEPENDENT_AMBULATORY_CARE_PROVIDER_SITE_OTHER): Payer: Medicare HMO | Admitting: Student

## 2022-02-25 ENCOUNTER — Encounter: Payer: Self-pay | Admitting: Student

## 2022-02-25 DIAGNOSIS — Z01810 Encounter for preprocedural cardiovascular examination: Secondary | ICD-10-CM | POA: Diagnosis not present

## 2022-02-25 NOTE — Progress Notes (Signed)
? ?Virtual Visit via Telephone Note  ? ?This visit type was conducted due to national recommendations for restrictions regarding the COVID-19 Pandemic (e.g. social distancing) in an effort to limit this patient's exposure and mitigate transmission in our community.  Due to his co-morbid illnesses, this patient is at least at moderate risk for complications without adequate follow up.  This format is felt to be most appropriate for this patient at this time.  The patient did not have access to video technology/had technical difficulties with video requiring transitioning to audio format only (telephone).  All issues noted in this document were discussed and addressed.  No physical exam could be performed with this format.  Please refer to the patient's chart for his  consent to telehealth for [facility] ?Evaluation Performed:  Preoperative cardiovascular risk assessment ? ?This visit type was conducted due to national recommendations for restrictions regarding the COVID-19 Pandemic (e.g. social distancing).  This format is felt to be most appropriate for this patient at this time.  All issues noted in this document were discussed and addressed.  No physical exam was performed (except for noted visual exam findings with Video Visits).  Please refer to the patient's chart (MyChart message for video visits and phone note for telephone visits) for the patient's consent to telehealth for [facility]. ?_____________  ? ?Date:  02/25/2022  ? ?Patient ID:  Robert [name], Robert [name] [birth year], MRN [id number] ?Patient Location:  ?Home ?Provider location:   ?Office ? ?Primary Care Provider:  Cassandria Anger, MD ?Primary Cardiologist:  Mertie Moores, MD ? ?Chief Complaint  ?  ?Robert [name] is a 71 y.o. male with a history of paroxysmal atrial fibrillation on Eliquis, hypertension, hyperlipidemia, type 2 diabetes mellitus, and bladder neoplasm who is pending a transurethral resection of bladder tumor with  bilateral uretheroscopy and presents today for telephonic preoperative cardiovascular risk assessment. ? ?Past Medical History  ?  ?Past Medical History:  ?Diagnosis Date  ? Anticoagulant long-term use   ? eliquis--- managed by cardiology  ? Bladder neoplasm   ? Diabetes mellitus type [name]   ? endocrinologist--- [name] --  (06-16-2021 per pt check blood sugar 1-2 times daily,  fasting sugar-- 118-128)  ? History of basal cell carcinoma (BCC) excision   ? History of COVID-19 05/2019  ? per pt mild symptoms that resolved  ? HLD (hyperlipidemia)   ? HTN (hypertension)   ? Hx of colonic polyps   ? Nocturia   ? Paroxysmal atrial fibrillation ([place]) 03/12/2019  ? cardiologist--- [name]  ? Ureterocele   ? ?Past Surgical History:  ?Procedure Laterality Date  ? BLEPHAROPLASTY Bilateral   ? per pt approx  2001;   upper eyelid's  ? COLONOSCOPY    ? last one approx 2020  ? PATELLAR TENDON REPAIR Left 04/16/2004  ? '@[facility]'$   ? TRANSURETHRAL RESECTION OF BLADDER TUMOR N/A 06/18/2021  ? Procedure: TRANSURETHRAL RESECTION OF BLADDER TUMOR (TURBT) WITH RIGHT URETERAL STENT PLACEMENT, RIGHT URETEROSCOPY WITH DISTRACTION OF TUMOR, FULGURATION;  Surgeon: Festus Aloe, MD;  Location: [facility];  Service: Urology;  Laterality: N/A;  ? ? ?Allergies ? ?No Known Allergies ? ?History of Present Illness  ?  ?Robert [name] is a 71 y.o. male who presents via audio/video conferencing for a telehealth visit today.  Patient was last seen in cardiology clinic on 08/17/2021 by Dr. Acie Fredrickson. At that time, Robert [name] was doing well with only occasional breakthrough tachycardia.  He is now pending a transurethral resection of bladder tumor with bilateral uretheroscopy on 03/15/2022. Since his last visit, he has done very well from a cardiac standpoint. He stays active in his yard push mowing and shoveling mulch and has no chest pain or shortness of breath with this. No orthopnea, PND, significant palpitations,  lightheadedness, dizziness, or syncope. ? ?Home Medications  ?  ?Prior to Admission medications   ?Medication Sig Start Date End Date Taking? Authorizing Provider  ?atorvastatin (LIPITOR) 40 MG tablet TAKE ONE TABLET BY MOUTH ONCE DAILY 02/18/22   [name], MD  ?b complex vitamins capsule Take 1 capsule by mouth daily.    [provider]  ?Dulaglutide (TRULICITY) 3 AS/3.4HD SOPN Inject 3 mg into the skin once a week. 12/06/21   [name], MD  ?Arne [place] 5 MG TABS tablet TAKE ONE TABLET BY MOUTH TWICE DAILY 07/12/21   Nahser, [name], MD  ?glipiZIDE (GLUCOTROL) 5 MG tablet Take 0.5 tablets (2.5 mg total) by mouth daily before supper. 01/14/22   [name], MD  ?glucose blood test strip 1 each by Other route daily. Use daily for glucose control - Dx 250.00 04/21/20   Isaac Bliss, Rayford Halsted, MD  ?hydrochlorothiazide (MICROZIDE) 12.5 MG capsule TAKE ONE CAPSULE BY MOUTH ONCE DAILY 10/13/21   Isaac Bliss, Rayford Halsted, MD  ?lisinopril (ZESTRIL) 40 MG tablet TAKE 1 AND 1/2 TABLETS BY MOUTH DAILY ?Patient taking differently: Take 40 mg by mouth daily. TAKE 1 AND 1/2 TABLETS BY MOUTH DAILY 07/15/21   Isaac Bliss, Rayford Halsted, MD  ?metFORMIN (GLUCOPHAGE) 1000 MG tablet TAKE ONE TABLET BY MOUTH TWICE DAILY with food 01/11/22   Isaac Bliss, Rayford Halsted, MD  ?metoprolol succinate (TOPROL-XL) 25 MG 24 hr tablet TAKE ONE TABLET BY MOUTH ONCE DAILY 09/14/21   Nahser, [name], MD  ?Multiple Vitamin (MULTIVITAMIN) tablet Take 1 tablet by mouth daily.    [provider]  ?propranolol (INDERAL) 10 MG tablet Take 1 tablet (10 mg total) by mouth 4 (four) times daily as needed (palpitations, irregular or fast heart rate). 05/25/20   Nahser, [name], MD  ?sildenafil (REVATIO) 20 MG tablet TAKE 1 TO 2 TABLETS 2 TO 3 HOURS BEFORE SEX 04/21/20   Isaac Bliss, Rayford Halsted, MD  ? ? ?Physical Exam  ?  ?Vital Signs:  Robert [name] does not have vital signs available for review today. ? ?Given  telephonic nature of communication, physical exam is limited: ?Alert and oriented x3. No acute distress. Normal affect. Speech and respirations are unlabored. ? ?Accessory Clinical Findings  ?  ?None ? ?Assessment & Plan  ?  ?Preoperative Cardiovascular Risk Assessment: ?Patient has upcoming transurethral resection of bladder tumor with bilateral uretheroscopy planned for 03/15/2022. He is doing very well from a cardiac standpoint with no chest pain, shortness of breath, acute CHF symptoms, significant palpitations, syncope. He is able to complete >4.0 METs of activity without any problems. Therefore, based on ACC/AHA guidelines, patient would be at acceptable risk for the planned procedure without further cardiovascular testing. Per Pharmacy and office protocol, he can hold Eliquis for 3 days prior to procedure. Please restart this as soon as safely possible after procedure. Recommend continuing Metoprolol perioperatively. I will route this recommendation to the requesting party via Epic fax function. ? ? ?Time:   ?Today, I have spent 3 minutes and 30 seconds with the patient with telehealth technology discussing medical history, symptoms, and management plan.   ? ? ?Darreld Mclean,  PA-C ? ?02/25/2022, 8:49 AM ?

## 2022-03-01 DIAGNOSIS — N289 Disorder of kidney and ureter, unspecified: Secondary | ICD-10-CM | POA: Diagnosis not present

## 2022-03-01 DIAGNOSIS — C678 Malignant neoplasm of overlapping sites of bladder: Secondary | ICD-10-CM | POA: Diagnosis not present

## 2022-03-10 ENCOUNTER — Other Ambulatory Visit: Payer: Self-pay | Admitting: Internal Medicine

## 2022-03-10 DIAGNOSIS — I10 Essential (primary) hypertension: Secondary | ICD-10-CM

## 2022-03-11 ENCOUNTER — Encounter (HOSPITAL_BASED_OUTPATIENT_CLINIC_OR_DEPARTMENT_OTHER): Payer: Self-pay | Admitting: Urology

## 2022-03-11 ENCOUNTER — Other Ambulatory Visit: Payer: Self-pay

## 2022-03-11 NOTE — Progress Notes (Addendum)
Spoke w/ via phone for pre-op interview--- pt Lab needs dos----  [employer] results------ current ekg in epic / chart COVID test -----patient states asymptomatic no test needed Arrive at ------- 0900 on 03-15-2022 NPO after MN NO Solid Food.  Clear liquids from MN until--- 0900 Med rec completed Medications to take morning of surgery ----- none Diabetic medication ----- do not take metformin morning of surgery Patient instructed no nail polish to be worn day of surgery Patient instructed to bring photo id and insurance card day of surgery Patient aware to have Driver (ride ) / caregiver  for 24 hours after surgery -- wife, Robert [name] Patient Special Instructions ----- n/a Pre-Op special Istructions ----- pt has telephone cardiac clearance by [name] PA on 02-25-2022 in epic/ chart Patient verbalized understanding of instructions that were given at this phone interview. Patient denies shortness of breath, chest pain, fever, cough at this phone interview.    Anesthesia Review: HTN;  PAF;  IDDM2 Pt denies cardiac s&s, sob, and no peripheral swelling  PCP: dr [name] Cardiologist : [name] (lov 08-17-2021 epic) Endocrinologist: [name] (lov 12-06-2021 epic) EKG : 08-17-2021 Echo : 11-16-2017 Stress test: no Cardiac Cath :  no Activity level: denies sob w/ any activity Sleep Study/ CPAP : no Fasting Blood Sugar :   118--200  / Checks Blood Sugar -- times a day:  1-2 times daily Blood Thinner/ Instructions /Last Dose: Eliquis ASA / Instructions/ Last Dose :  No Per pt was given instructions to stop eliquis 3 days prior to surgery by cardiology , stated ldose will be Friday evening 03-11-2022

## 2022-03-15 ENCOUNTER — Encounter (HOSPITAL_BASED_OUTPATIENT_CLINIC_OR_DEPARTMENT_OTHER)
Admission: RE | Disposition: A | Discharge status: Home / Self Care | Payer: Self-pay | Source: Home / Self Care | Attending: Urology

## 2022-03-15 ENCOUNTER — Ambulatory Visit (HOSPITAL_BASED_OUTPATIENT_CLINIC_OR_DEPARTMENT_OTHER): Payer: Medicare HMO | Admitting: Anesthesiology

## 2022-03-15 ENCOUNTER — Ambulatory Visit (HOSPITAL_BASED_OUTPATIENT_CLINIC_OR_DEPARTMENT_OTHER)
Admission: RE | Admit: 2022-03-15 | Discharge: 2022-03-15 | Disposition: A | Discharge status: Home / Self Care | Payer: Medicare HMO | Attending: Urology | Admitting: Urology

## 2022-03-15 ENCOUNTER — Other Ambulatory Visit: Payer: Self-pay

## 2022-03-15 ENCOUNTER — Encounter (HOSPITAL_BASED_OUTPATIENT_CLINIC_OR_DEPARTMENT_OTHER): Payer: Self-pay | Admitting: Urology

## 2022-03-15 DIAGNOSIS — C671 Malignant neoplasm of dome of bladder: Secondary | ICD-10-CM

## 2022-03-15 DIAGNOSIS — C688 Malignant neoplasm of overlapping sites of urinary organs: Secondary | ICD-10-CM | POA: Diagnosis not present

## 2022-03-15 DIAGNOSIS — D494 Neoplasm of unspecified behavior of bladder: Secondary | ICD-10-CM

## 2022-03-15 DIAGNOSIS — C679 Malignant neoplasm of bladder, unspecified: Secondary | ICD-10-CM | POA: Diagnosis not present

## 2022-03-15 DIAGNOSIS — Z7984 Long term (current) use of oral hypoglycemic drugs: Secondary | ICD-10-CM | POA: Diagnosis not present

## 2022-03-15 DIAGNOSIS — Z7901 Long term (current) use of anticoagulants: Secondary | ICD-10-CM | POA: Insufficient documentation

## 2022-03-15 DIAGNOSIS — Z87891 Personal history of nicotine dependence: Secondary | ICD-10-CM | POA: Diagnosis not present

## 2022-03-15 DIAGNOSIS — I10 Essential (primary) hypertension: Secondary | ICD-10-CM | POA: Diagnosis not present

## 2022-03-15 DIAGNOSIS — E119 Type 2 diabetes mellitus without complications: Secondary | ICD-10-CM

## 2022-03-15 DIAGNOSIS — I4891 Unspecified atrial fibrillation: Secondary | ICD-10-CM | POA: Diagnosis not present

## 2022-03-15 DIAGNOSIS — N401 Enlarged prostate with lower urinary tract symptoms: Secondary | ICD-10-CM | POA: Diagnosis not present

## 2022-03-15 DIAGNOSIS — Z01818 Encounter for other preprocedural examination: Secondary | ICD-10-CM

## 2022-03-15 DIAGNOSIS — D4959 Neoplasm of unspecified behavior of other genitourinary organ: Secondary | ICD-10-CM

## 2022-03-15 DIAGNOSIS — C7919 Secondary malignant neoplasm of other urinary organs: Secondary | ICD-10-CM | POA: Diagnosis not present

## 2022-03-15 DIAGNOSIS — N138 Other obstructive and reflux uropathy: Secondary | ICD-10-CM | POA: Diagnosis not present

## 2022-03-15 DIAGNOSIS — I480 Paroxysmal atrial fibrillation: Secondary | ICD-10-CM | POA: Diagnosis not present

## 2022-03-15 DIAGNOSIS — D090 Carcinoma in situ of bladder: Secondary | ICD-10-CM | POA: Diagnosis not present

## 2022-03-15 HISTORY — DX: Type 2 diabetes mellitus without complications: E11.9

## 2022-03-15 HISTORY — DX: Malignant neoplasm of bladder, unspecified: C67.9

## 2022-03-15 HISTORY — PX: TRANSURETHRAL RESECTION OF BLADDER TUMOR: SHX2575

## 2022-03-15 LAB — POCT I-STAT, CHEM 8
BUN: 13 mg/dL (ref 8–23)
Calcium, Ion: 1.28 mmol/L (ref 1.15–1.40)
Chloride: 98 mmol/L (ref 98–111)
Creatinine, Ser: 0.6 mg/dL — ABNORMAL LOW (ref 0.61–1.24)
Glucose, Bld: 152 mg/dL — ABNORMAL HIGH (ref 70–99)
HCT: 50 % (ref 39.0–52.0)
Hemoglobin: 17 g/dL (ref 13.0–17.0)
Potassium: 4 mmol/L (ref 3.5–5.1)
Sodium: 138 mmol/L (ref 135–145)
TCO2: 27 mmol/L (ref 22–32)

## 2022-03-15 LAB — GLUCOSE, CAPILLARY: Glucose-Capillary: 144 mg/dL — ABNORMAL HIGH (ref 70–99)

## 2022-03-15 SURGERY — TURBT (TRANSURETHRAL RESECTION OF BLADDER TUMOR)
Anesthesia: General | Site: Pelvis

## 2022-03-15 MED ORDER — ACETAMINOPHEN 500 MG PO TABS
ORAL_TABLET | ORAL | Status: AC
  Filled 2022-03-15: qty 2

## 2022-03-15 MED ORDER — FENTANYL CITRATE (PF) 100 MCG/2ML IJ SOLN
INTRAMUSCULAR | Status: AC
  Filled 2022-03-15: qty 2

## 2022-03-15 MED ORDER — OXYCODONE HCL 5 MG PO TABS
ORAL_TABLET | ORAL | Status: AC
  Filled 2022-03-15: qty 1

## 2022-03-15 MED ORDER — PROPOFOL 10 MG/ML IV BOLUS
INTRAVENOUS | Status: AC
  Filled 2022-03-15: qty 20

## 2022-03-15 MED ORDER — STERILE WATER FOR IRRIGATION IR SOLN
Status: DC | PRN
  Administered 2022-03-15: 3000 mL

## 2022-03-15 MED ORDER — GEMCITABINE CHEMO FOR BLADDER INSTILLATION 2000 MG
2000.0000 mg | Freq: Once | INTRAVENOUS | Status: DC
  Filled 2022-03-15: qty 52.6

## 2022-03-15 MED ORDER — FENTANYL CITRATE (PF) 100 MCG/2ML IJ SOLN
INTRAMUSCULAR | Status: DC | PRN
  Administered 2022-03-15: 50 ug via INTRAVENOUS
  Administered 2022-03-15 (×2): 25 ug via INTRAVENOUS
  Administered 2022-03-15 (×2): 50 ug via INTRAVENOUS

## 2022-03-15 MED ORDER — OXYCODONE HCL 5 MG/5ML PO SOLN: 5.0000 mg | Freq: Once | ORAL | Status: DC | PRN

## 2022-03-15 MED ORDER — PHENYLEPHRINE 80 MCG/ML (10ML) SYRINGE FOR IV PUSH (FOR BLOOD PRESSURE SUPPORT)
PREFILLED_SYRINGE | INTRAVENOUS | Status: AC
  Filled 2022-03-15: qty 10

## 2022-03-15 MED ORDER — LIDOCAINE HCL (PF) 2 % IJ SOLN
INTRAMUSCULAR | Status: AC
  Filled 2022-03-15: qty 5

## 2022-03-15 MED ORDER — PROPOFOL 10 MG/ML IV BOLUS
INTRAVENOUS | Status: DC | PRN
Start: 2022-03-15 — End: 2022-03-15
  Administered 2022-03-15: 50 mg via INTRAVENOUS
  Administered 2022-03-15: 20 mg via INTRAVENOUS
  Administered 2022-03-15: 200 mg via INTRAVENOUS

## 2022-03-15 MED ORDER — EPHEDRINE 5 MG/ML INJ
INTRAVENOUS | Status: AC
Start: 2022-03-15 — End: ?
  Filled 2022-03-15: qty 5

## 2022-03-15 MED ORDER — DEXAMETHASONE SODIUM PHOSPHATE 10 MG/ML IJ SOLN
INTRAMUSCULAR | Status: DC | PRN
Start: 2022-03-15 — End: 2022-03-15
  Administered 2022-03-15: 5 mg via INTRAVENOUS

## 2022-03-15 MED ORDER — APIXABAN 5 MG PO TABS: 5.0000 mg | ORAL_TABLET | Freq: Two times a day (BID) | ORAL | 5 refills | Status: DC

## 2022-03-15 MED ORDER — CEFAZOLIN SODIUM-DEXTROSE 2-4 GM/100ML-% IV SOLN
2.0000 g | Freq: Once | INTRAVENOUS | Status: AC
  Administered 2022-03-15: 2 g via INTRAVENOUS

## 2022-03-15 MED ORDER — ONDANSETRON HCL 4 MG/2ML IJ SOLN: 4.0000 mg | Freq: Once | INTRAMUSCULAR | Status: DC | PRN

## 2022-03-15 MED ORDER — CEFAZOLIN SODIUM-DEXTROSE 2-4 GM/100ML-% IV SOLN
INTRAVENOUS | Status: AC
  Filled 2022-03-15: qty 100

## 2022-03-15 MED ORDER — SUCCINYLCHOLINE CHLORIDE 200 MG/10ML IV SOSY
PREFILLED_SYRINGE | INTRAVENOUS | Status: AC
  Filled 2022-03-15: qty 10

## 2022-03-15 MED ORDER — AMISULPRIDE (ANTIEMETIC) 5 MG/2ML IV SOLN: 10.0000 mg | Freq: Once | INTRAVENOUS | Status: DC | PRN

## 2022-03-15 MED ORDER — SUCCINYLCHOLINE CHLORIDE 200 MG/10ML IV SOSY
PREFILLED_SYRINGE | INTRAVENOUS | Status: DC | PRN
  Administered 2022-03-15 (×2): 20 mg via INTRAVENOUS

## 2022-03-15 MED ORDER — OXYCODONE HCL 5 MG PO TABS: 5.0000 mg | ORAL_TABLET | Freq: Once | ORAL | Status: DC | PRN

## 2022-03-15 MED ORDER — LACTATED RINGERS IV SOLN: INTRAVENOUS | Status: DC

## 2022-03-15 MED ORDER — LIDOCAINE 2% (20 MG/ML) 5 ML SYRINGE
INTRAMUSCULAR | Status: DC | PRN
  Administered 2022-03-15: 100 mg via INTRAVENOUS

## 2022-03-15 MED ORDER — PHENYLEPHRINE 80 MCG/ML (10ML) SYRINGE FOR IV PUSH (FOR BLOOD PRESSURE SUPPORT)
PREFILLED_SYRINGE | INTRAVENOUS | Status: DC | PRN
  Administered 2022-03-15 (×2): 80 ug via INTRAVENOUS
  Administered 2022-03-15: 160 ug via INTRAVENOUS

## 2022-03-15 MED ORDER — STERILE WATER FOR IRRIGATION IR SOLN
Status: DC | PRN
  Administered 2022-03-15: 500 mL

## 2022-03-15 MED ORDER — DEXAMETHASONE SODIUM PHOSPHATE 10 MG/ML IJ SOLN
INTRAMUSCULAR | Status: AC
  Filled 2022-03-15: qty 1

## 2022-03-15 MED ORDER — ACETAMINOPHEN 500 MG PO TABS
1000.0000 mg | ORAL_TABLET | Freq: Once | ORAL | Status: AC
  Administered 2022-03-15: 1000 mg via ORAL

## 2022-03-15 MED ORDER — IOHEXOL 300 MG/ML  SOLN
INTRAMUSCULAR | Status: DC | PRN
Start: 2022-03-15 — End: 2022-03-15
  Administered 2022-03-15: 10 mL via URETHRAL

## 2022-03-15 MED ORDER — FENTANYL CITRATE (PF) 100 MCG/2ML IJ SOLN: 25.0000 ug | INTRAMUSCULAR | Status: DC | PRN

## 2022-03-15 MED ORDER — ONDANSETRON HCL 4 MG/2ML IJ SOLN
INTRAMUSCULAR | Status: DC | PRN
  Administered 2022-03-15: 4 mg via INTRAVENOUS

## 2022-03-15 MED ORDER — SODIUM CHLORIDE 0.9 % IR SOLN
Status: DC | PRN
Start: 2022-03-15 — End: 2022-03-15
  Administered 2022-03-15 (×2): 6000 mL via INTRAVESICAL

## 2022-03-15 MED ORDER — ONDANSETRON HCL 4 MG/2ML IJ SOLN
INTRAMUSCULAR | Status: AC
  Filled 2022-03-15: qty 2

## 2022-03-15 SURGICAL SUPPLY — 39 items
BAG DRAIN URO-CYSTO SKYTR STRL (DRAIN) ×2 IMPLANT
BAG DRN RND TRDRP ANRFLXCHMBR (UROLOGICAL SUPPLIES) ×1
BAG DRN UROCATH (DRAIN) ×1
BAG URINE DRAIN 2000ML AR STRL (UROLOGICAL SUPPLIES) ×1 IMPLANT
BAG URINE LEG 500ML (DRAIN) IMPLANT
CABLE HIGH FREQUENCY MONO STRZ (ELECTRODE) ×1 IMPLANT
CATH FOLEY 2WAY SLVR  5CC 18FR (CATHETERS) ×2
CATH FOLEY 2WAY SLVR  5CC 20FR (CATHETERS)
CATH FOLEY 2WAY SLVR  5CC 22FR (CATHETERS)
CATH FOLEY 2WAY SLVR 5CC 18FR (CATHETERS) IMPLANT
CATH FOLEY 2WAY SLVR 5CC 20FR (CATHETERS) IMPLANT
CATH FOLEY 2WAY SLVR 5CC 22FR (CATHETERS) IMPLANT
CATH URET 5FR 28IN OPEN ENDED (CATHETERS) ×1 IMPLANT
CLOTH BEACON ORANGE TIMEOUT ST (SAFETY) ×2 IMPLANT
DRSG TELFA 3X8 NADH (GAUZE/BANDAGES/DRESSINGS) ×2 IMPLANT
ELECT COAG BALL END 3FR (ELECTROSURGICAL) ×2
ELECT REM PT RETURN 9FT ADLT (ELECTROSURGICAL) ×2
ELECTRODE COAG BALL END 3FR (ELECTROSURGICAL) IMPLANT
ELECTRODE REM PT RTRN 9FT ADLT (ELECTROSURGICAL) ×1 IMPLANT
EVACUATOR MICROVAS BLADDER (UROLOGICAL SUPPLIES) IMPLANT
GLOVE BIO SURGEON STRL SZ7.5 (GLOVE) ×2 IMPLANT
GLOVE BIO SURGEON STRL SZ8 (GLOVE) IMPLANT
GOWN STRL REUS W/TWL LRG LVL3 (GOWN DISPOSABLE) ×2 IMPLANT
GUIDEWIRE STR DUAL SENSOR (WIRE) ×1 IMPLANT
HOLDER FOLEY CATH W/STRAP (MISCELLANEOUS) ×1 IMPLANT
IV NS IRRIG 3000ML ARTHROMATIC (IV SOLUTION) ×4 IMPLANT
KIT TURNOVER CYSTO (KITS) ×2 IMPLANT
LOOP CUT BIPOLAR 24F LRG (ELECTROSURGICAL) ×1 IMPLANT
MANIFOLD NEPTUNE II (INSTRUMENTS) ×2 IMPLANT
NS IRRIG 500ML POUR BTL (IV SOLUTION) ×1 IMPLANT
PACK CYSTO (CUSTOM PROCEDURE TRAY) ×2 IMPLANT
PAD DRESSING TELFA 3X8 NADH (GAUZE/BANDAGES/DRESSINGS) IMPLANT
PLUG CATH AND CAP STER (CATHETERS) IMPLANT
STENT URET 6FRX26 CONTOUR (STENTS) ×1 IMPLANT
SYR 10ML LL (SYRINGE) ×1 IMPLANT
TUBE CONNECTING 12X1/4 (SUCTIONS) ×2 IMPLANT
TUBING UROLOGY SET (TUBING) ×2 IMPLANT
WATER STERILE IRR 3000ML UROMA (IV SOLUTION) ×1 IMPLANT
WATER STERILE IRR 500ML POUR (IV SOLUTION) ×1 IMPLANT

## 2022-03-15 NOTE — Op Note (Addendum)
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  On recent office cystoscopy he was noted to have recurrence at the dome and possibly at the left UO and lateral.   Findings: On cystoscopy the urethra was unremarkable, prostatic urethra had moderate hyperplasia borderline obstruction.  The left ureteral orifice was in its normal orthotopic position with clear reflux.  There was an erythematous patch lateral to it but not involving the left UO.  The right ureteral orifice had been resected prior along with the ureterocele and slightly more lateral.  He flux was clear.  At the dome of the bladder there were multiple papillary tumors.  Left retrograde pyelogram-this outlined a single ureter single collecting system unit without filling defect, stricture or dilation.  Right retrograde pyelogram this outlined a single ureter single collecting system unit but there was a filling defect about 10 to 15 mm in length in the right distal ureter.  Proximally there was no hydronephrosis and no other filling defect or abnormality of the mid and proximal ureter as well as the collecting system.  Left ureteroscopy revealed a normal left distal ureter.   Right ureteroscopy revealed multiple papillary tumors along the right distal ureter for about 15 mm proximal to this was clear.  Exam under anesthesia-this outlined a circumcised penis, glans and meatus appeared normal.  Penis  appeared normal.  Scrotum appeared normal.  Testicles descended bilaterally and palpably normal.  On DRE the prostate was about 40 g and smooth without hard area or nodule.  No palpable bladder mass or pelvic mass noted.  Description of procedure: After consent was obtained patient brought to the operating room.  After adequate anesthesia was placed lithotomy position and prepped and draped in the usual sterile fashion.  Timeout was performed to current the patient and procedure.  Cystoscope was passed per urethra the bladder was carefully inspected with a 30 and 70 degree lens.  Left ureteral orifice was cannulated with a 5 [place] open-ended catheter and retrograde injection of contrast was performed.  Fluoroscopic imaging obtained.  Right ureteral orifice was cannulated with a 5 [place] open-ended catheter and retrograde injection of contrast was performed.  Fluoroscopic imaging was obtained.  A semirigid ureteroscope was then advanced into the right ureteral orifice given the abnormality and multiple small papillary tumors were noted along the right distal ureter for about 15 mm.  Proximally the ureter cleared and was cleared on the retrograde.  The semirigid was then advanced into the left distal ureter and the side noted to be normal without mucosal lesion.  The cystoscope was then passed and a use cold cup biopsy to biopsy the patch of erythema lateral to the left UO.  The resectoscope continuous-flow sheath was passed with the visual obturator.  I then swapped that out for the loop and the handle.  The left biopsy sites were fulgurated.  This did not involve the left ureteral orifice.  Attention was then turned to the dome.  This was a moderately difficult resection given his large bladder capacity.  At  times the loop was at its limit and the length and I would have to manage bubbles and bladder volume.  Breathing was steady by anesthesia.  Resection was excellent without concerned about  perforation.  Hemostasis was excellent at low pressure.  This was about a 5 cm resection.  I then switch that out for the semirigid dual channel and went into the right ureteral orifice and into the right distal ureter.  We did about 6 biopsies with a ureteroscopic biopsy forceps.  These are minute and tissue yield is questionable.  I then took the ureteroscopic Bugbee and fulgurated all the papillary tumor.  The semirigid was swapped out for the cystoscope with a sensor wire was advanced and coiled in the upper calyx.  A 6 x 26 cm stent was then advanced.  The wire was removed with a good coil seen in the kidney and a good coil in the bladder.  Scope was backed out and I placed an 29 French Foley catheter and left to gravity drainage.  We will plan to leave that for a couple of days to allow of the bladder and ureter to heal.  Plan to restart Eliquis in 3 days.  He was awakened and taken recovery room in stable condition.   Complications: None  Blood loss: Minimal  Specimens to pathology: #1 left bladder biopsy #2 dome TUR #3 right distal ureter biopsy  Drains: 6 x 26 cm right ureteral stent, 18 French Foley catheter  Disposition: Patient stable to PACU

## 2022-03-15 NOTE — Discharge Instructions (Addendum)
You have a right ureteral stent. You have a Foley catheter- remove Foley catheter on Thursday morning, May 25th 2023 as instructed.  Or call the office.   Post Anesthesia Home Care Instructions  Activity: Get plenty of rest for the remainder of the day. A responsible individual must stay with you for 24 hours following the procedure.  For the next 24 hours, DO NOT: -Drive a car -Paediatric nurse -Drink alcoholic beverages -Take any medication unless instructed by your physician -Make any legal decisions or sign important papers.  Meals: Start with liquid foods such as gelatin or soup. Progress to regular foods as tolerated. Avoid greasy, spicy, heavy foods. If nausea and/or vomiting occur, drink only clear liquids until the nausea and/or vomiting subsides. Call your physician if vomiting continues.  Special Instructions/Symptoms: Your throat may feel dry or sore from the anesthesia or the breathing tube placed in your throat during surgery. If this causes discomfort, gargle with warm salt water. The discomfort should disappear within 24 hours.

## 2022-03-15 NOTE — Anesthesia Procedure Notes (Signed)
Procedure Name: LMA Insertion Date/Time: 03/15/2022 11:04 AM Performed by: Suan Halter, CRNA Pre-anesthesia Checklist: Patient identified, Emergency Drugs available, Suction available and Patient being monitored Patient Re-evaluated:Patient Re-evaluated prior to induction Oxygen Delivery Method: Circle system utilized Preoxygenation: Pre-oxygenation with 100% oxygen Induction Type: IV induction Ventilation: Mask ventilation without difficulty LMA: LMA inserted LMA Size: 5.0 Number of attempts: 1 Airway Equipment and Method: Bite block Placement Confirmation: positive ETCO2 Tube secured with: Tape Dental Injury: Teeth and Oropharynx as per pre-operative assessment

## 2022-03-15 NOTE — Transfer of Care (Signed)
Immediate Anesthesia Transfer of Care Note  Patient: Robert [name]  Procedure(s) Performed: TRANSURETHRAL RESECTION OF BLADDER TUMOR (TURBT) WITH BILATERAL URETEROSCOPY/URETHRAL/ DILATION/BILATERAL RETROGRADE PYELOGRAM/BIOPSY AND FULGURATION OF RIGHT URETERAL CANCER/RIGHT STENT PLACEMENT (Pelvis)  Patient Location: PACU  Anesthesia Type:General  Level of Consciousness: awake, alert , oriented and patient cooperative  Airway & Oxygen Therapy: Patient Spontanous Breathing  Post-op Assessment: Report given to RN and Post -op Vital signs reviewed and stable  Post vital signs: Reviewed and stable  Last Vitals:  Vitals Value Taken Time  BP    Temp    Pulse 78 03/15/22 1226  Resp 20 03/15/22 1226  SpO2 93 % 03/15/22 1226  Vitals shown include unvalidated device data.  Last Pain:  Vitals:   03/15/22 0907  TempSrc: Oral  PainSc: 0-No pain      Patients Stated Pain Goal: 5 (44/17/12 7871)  Complications: No notable events documented.

## 2022-03-15 NOTE — Anesthesia Preprocedure Evaluation (Signed)
Anesthesia Evaluation  Patient identified by MRN, date of birth, ID band Patient awake    Reviewed: Allergy & Precautions, NPO status , Patient's Chart, lab work & pertinent test results, reviewed documented beta blocker date and time   History of Anesthesia Complications Negative for: history of anesthetic complications  Airway Mallampati: I  TM Distance: >3 FB Neck ROM: Full    Dental  (+) Dental Advisory Given, Teeth Intact   Pulmonary neg pulmonary ROS, former smoker,    Pulmonary exam normal        Cardiovascular hypertension, Pt. on medications and Pt. on home beta blockers Normal cardiovascular exam+ dysrhythmias Atrial Fibrillation    Echo 2019: EF 60-65%, severe LVH, no RWMA, g1dd, aortic root 40 mm, ascending aorta 38 mm, trivial PR   Neuro/Psych negative neurological ROS     GI/Hepatic negative GI ROS, Neg liver ROS,   Endo/Other  negative endocrine ROSdiabetes, Type 2, Oral Hypoglycemic Agents  Renal/GU negative Renal ROS   Bladder cancer    Musculoskeletal negative musculoskeletal ROS (+)   Abdominal   Peds  Hematology negative hematology ROS (+)   Anesthesia Other Findings Eliquis  Reproductive/Obstetrics                            Anesthesia Physical Anesthesia Plan  ASA: 3  Anesthesia Plan: General   Post-op Pain Management: Tylenol PO (pre-op)* and Toradol IV (intra-op)*   Induction: Intravenous  PONV Risk Score and Plan: 2 and Ondansetron, Dexamethasone, Midazolam and Treatment may vary due to age or medical condition  Airway Management Planned: LMA  Additional Equipment: None  Intra-op Plan:   Post-operative Plan: Extubation in OR  Informed Consent: I have reviewed the patients History and Physical, chart, labs and discussed the procedure including the risks, benefits and alternatives for the proposed anesthesia with the patient or authorized  representative who has indicated his/her understanding and acceptance.     Dental advisory given  Plan Discussed with:   Anesthesia Plan Comments:         Anesthesia Quick Evaluation

## 2022-03-15 NOTE — H&P (Signed)
H&P  Chief Complaint: Bladder neoplasm  History of Present Illness: Robert [name] is a 71 year old male.  He presented with gross hematuria and underwent a TURBT August 2022 which revealed high-grade TA disease.  Most of this was in a right ureterocele with some creeping up the right distal ureter which was fulgurated.  A stent was left and he underwent BCG x6 in the office which completed November 2022.  On surveillance cystoscopy Feb 23, 2022 there were several tumors at the dome possible some tumor around the left ureteral orifice.  He is brought today for cystoscopy, TURBT, bilateral ureteroscopy.  He has been well.  No dysuria or gross hematuria.  He stopped his Eliquis.  Past Medical History:  Diagnosis Date   Anticoagulant long-term use    eliquis--- managed by cardiology   Bladder cancer [facility])    urologist--- dr Junious Silk;  overlapping   History of basal cell carcinoma ([place]) excision    History of COVID-19 05/2019   per pt mild symptoms that resolved   HLD (hyperlipidemia)    HTN (hypertension)    Hx of colonic polyps    Insulin dependent type 2 diabetes mellitus [facility])    endocrinologist--- [name] --  (03-11-2022 per pt check blood sugar 1-2 times daily,  fasting sugar-- 118-120s)   Nocturia    Paroxysmal atrial fibrillation ([place]) 03/12/2019   cardiologist--- [name]   Ureterocele    Past Surgical History:  Procedure Laterality Date   BLEPHAROPLASTY Bilateral    per pt approx  2001;   upper eyelid's   COLONOSCOPY     last one approx 2020   PATELLAR TENDON REPAIR Left 04/16/2004   '@[facility]'$    TRANSURETHRAL RESECTION OF BLADDER TUMOR N/A 06/18/2021   Procedure: TRANSURETHRAL RESECTION OF BLADDER TUMOR (TURBT) WITH RIGHT URETERAL STENT PLACEMENT, RIGHT URETEROSCOPY WITH DISTRACTION OF TUMOR, FULGURATION;  Surgeon: Festus Aloe, MD;  Location: [facility];  Service: Urology;  Laterality: N/A;    Home Medications:  Medications Prior to Admission  Medication Sig  Dispense Refill Last Dose   acetaminophen (TYLENOL) 500 MG tablet Take 1,000 mg by mouth every 6 (six) hours as needed.   03/13/2022   atorvastatin (LIPITOR) 40 MG tablet TAKE ONE TABLET BY MOUTH ONCE DAILY (Patient taking differently: Take 40 mg by mouth at bedtime.) 90 tablet 1 03/14/2022   b complex vitamins capsule Take 1 capsule by mouth daily.   03/01/2022   ELIQUIS 5 MG TABS tablet TAKE ONE TABLET BY MOUTH TWICE DAILY (Patient taking differently: Take 5 mg by mouth 2 (two) times daily.) 60 tablet 5 03/11/2022 at 2100   glucose blood test strip 1 each by Other route daily. Use daily for glucose control - Dx 250.00 100 each 0 03/14/2022   hydrochlorothiazide (MICROZIDE) 12.5 MG capsule TAKE ONE CAPSULE BY MOUTH ONCE DAILY (Patient taking differently: Take 12.5 mg by mouth daily.) 90 capsule 1 03/14/2022   lisinopril (ZESTRIL) 40 MG tablet TAKE 1 AND 1/2 TABLETS BY MOUTH ONCE DAILY (Patient taking differently: 40 mg daily. TAKE 1 AND 1/2 TABLETS BY MOUTH ONCE DAILY) 135 tablet 0 03/14/2022   metFORMIN (GLUCOPHAGE) 1000 MG tablet TAKE ONE TABLET BY MOUTH TWICE DAILY with food (Patient taking differently: Take 1,000 mg by mouth 2 (two) times daily with a meal.) 180 tablet 0 03/14/2022   metoprolol succinate (TOPROL-XL) 25 MG 24 hr tablet TAKE ONE TABLET BY MOUTH ONCE DAILY (Patient taking differently: 25 mg at bedtime.) 90 tablet 3 03/14/2022 at 2100  Multiple Vitamin (MULTIVITAMIN) tablet Take 1 tablet by mouth daily.   03/01/2022   propranolol (INDERAL) 10 MG tablet Take 1 tablet (10 mg total) by mouth 4 (four) times daily as needed (palpitations, irregular or fast heart rate). 30 tablet 9 03/14/2022 at 1200   sildenafil (REVATIO) 20 MG tablet TAKE 1 TO 2 TABLETS 2 TO 3 HOURS BEFORE SEX (Patient taking differently: as needed. TAKE 1 TO 2 TABLETS 2 TO 3 HOURS BEFORE SEX) 30 tablet 5    Dulaglutide (TRULICITY) 3 PT/4.6FK SOPN Inject 3 mg into the skin once a week. (Patient taking differently: Inject 3 mg into the  skin once a week. Tuesday's) 6 mL 3 03/08/2022   glipiZIDE (GLUCOTROL) 5 MG tablet Take 0.5 tablets (2.5 mg total) by mouth daily before supper. (Patient taking differently: Take 2.5 mg by mouth daily before supper. Per pt takes after large meal supper) 45 tablet 3 More than a month   Allergies: No Known Allergies  Family History  Problem Relation Age of Onset   Aortic aneurysm Other        family history   Cancer Other        Breast (1st degree relative)   Pancreatic cancer Mother 75   Colon cancer Neg Hx    Rectal cancer Neg Hx    Stomach cancer Neg Hx    Esophageal cancer Neg Hx    Social History:  reports that he quit smoking about 11 years ago. His smoking use included cigars. He has never used smokeless tobacco. He reports current alcohol use. He reports that he does not use drugs.  ROS: A complete review of systems was performed.  All systems are negative except for pertinent findings as noted. Review of Systems  All other systems reviewed and are negative.   Physical Exam:  Vital signs in last 24 hours: Temp:  [97.6 F (36.4 C)] 97.6 F (36.4 C) (05/23 0907) Pulse Rate:  [64] 64 (05/23 0907) Resp:  [16] 16 (05/23 0907) BP: (146)/(86) 146/86 (05/23 0907) SpO2:  [99 %] 99 % (05/23 0907) Weight:  [92.6 kg] 92.6 kg (05/23 0907) General:  Alert and oriented, No acute distress HEENT: Normocephalic, atraumatic Cardiovascular: Regular rate and rhythm Lungs: Regular rate and effort Abdomen: Soft, nontender, nondistended, no abdominal masses Back: No CVA tenderness Extremities: No edema Neurologic: Grossly intact  Laboratory Data:  Results for orders placed or performed during the hospital encounter of 03/15/22 (from the past 24 hour(s))  I-STAT, chem 8     Status: Abnormal   Collection Time: 03/15/22  9:36 AM  Result Value Ref Range   Sodium 138 135 - 145 mmol/L   Potassium 4.0 3.5 - 5.1 mmol/L   Chloride 98 98 - 111 mmol/L   BUN 13 8 - 23 mg/dL   Creatinine, Ser  0.60 (L) 0.61 - 1.24 mg/dL   Glucose, Bld 152 (H) 70 - 99 mg/dL   Calcium, Ion 1.28 1.15 - 1.40 mmol/L   TCO2 27 22 - 32 mmol/L   Hemoglobin 17.0 13.0 - 17.0 g/dL   HCT 50.0 39.0 - 52.0 %   No results found for this or any previous visit (from the past 240 hour(s)). Creatinine: Recent Labs    03/15/22 0936  CREATININE 0.60*    Impression/Assessment:  Bladder neoplasm uncertain malignant potential with a history of high-grade TA disease-  Plan:  I discussed with the patient the nature, potential benefits, risks and alternatives to cystoscopy, TURBT, bilateral ureteroscopy, possible instillation of  gemcitabine in PACU, including side effects of the proposed treatment, the likelihood of the patient achieving the goals of the procedure, and any potential problems that might occur during the procedure or recuperation.  We discussed he may need stents and a Foley catheter afterwards.  All questions answered. Patient elects to proceed.   Festus Aloe 03/15/2022, 10:51 AM

## 2022-03-15 NOTE — Anesthesia Postprocedure Evaluation (Signed)
Anesthesia Post Note  Patient: Robert [name]  Procedure(s) Performed: TRANSURETHRAL RESECTION OF BLADDER TUMOR (TURBT) WITH BILATERAL URETEROSCOPY/URETHRAL/ DILATION/BILATERAL RETROGRADE PYELOGRAM/BIOPSY AND FULGURATION OF RIGHT URETERAL CANCER/RIGHT STENT PLACEMENT (Pelvis)     Patient location during evaluation: PACU Anesthesia Type: General Level of consciousness: awake and alert Pain management: pain level controlled Vital Signs Assessment: post-procedure vital signs reviewed and stable Respiratory status: spontaneous breathing, nonlabored ventilation and respiratory function stable Cardiovascular status: blood pressure returned to baseline and stable Postop Assessment: no apparent nausea or vomiting Anesthetic complications: no   No notable events documented.  Last Vitals:  Vitals:   03/15/22 1300 03/15/22 1346  BP: 131/67 (!) 148/82  Pulse: 69 71  Resp: 18 16  Temp: 36.6 C 36.4 C  SpO2: 95% 100%    Last Pain:  Vitals:   03/15/22 1346  TempSrc: Axillary  PainSc: [place]

## 2022-03-16 ENCOUNTER — Encounter (HOSPITAL_BASED_OUTPATIENT_CLINIC_OR_DEPARTMENT_OTHER): Payer: Self-pay | Admitting: Urology

## 2022-03-16 LAB — SURGICAL PATHOLOGY

## 2022-03-28 ENCOUNTER — Encounter: Payer: Self-pay | Admitting: Internal Medicine

## 2022-03-28 ENCOUNTER — Ambulatory Visit: Payer: Medicare HMO | Admitting: Internal Medicine

## 2022-03-28 VITALS — BP 130/86 | HR 78 | Ht 75.0 in | Wt 209.6 lb

## 2022-03-28 DIAGNOSIS — E785 Hyperlipidemia, unspecified: Secondary | ICD-10-CM | POA: Diagnosis not present

## 2022-03-28 DIAGNOSIS — E663 Overweight: Secondary | ICD-10-CM | POA: Diagnosis not present

## 2022-03-28 DIAGNOSIS — E119 Type 2 diabetes mellitus without complications: Secondary | ICD-10-CM

## 2022-03-28 DIAGNOSIS — R300 Dysuria: Secondary | ICD-10-CM | POA: Diagnosis not present

## 2022-03-28 LAB — POCT GLYCOSYLATED HEMOGLOBIN (HGB A1C): Hemoglobin A1C: 6.7 % — AB (ref 4.0–5.6)

## 2022-03-28 MED ORDER — GLIPIZIDE ER 2.5 MG PO TB24: 2.5000 mg | ORAL_TABLET | Freq: Two times a day (BID) | ORAL | 3 refills | Status: DC

## 2022-03-28 NOTE — Patient Instructions (Addendum)
Please continue: - Metformin 2000 mg with dinner - Trulicity 3 mg weekly  Please use: - Glipizide ER 2.5 mg before breakfast and a crushed tablet before a large meal  Please return in 4 months with your sugar log.

## 2022-03-28 NOTE — Progress Notes (Signed)
Patient ID: KATAI MARSICO, male   DOB: Jan 15, 1951, 71 y.o.   MRN: [id number]   This visit occurred during the SARS-CoV-2 public health emergency.  Safety protocols were in place, including screening questions prior to the visit, additional usage of staff PPE, and extensive cleaning of exam room while observing appropriate contact time as indicated for disinfecting solutions.   HPI: TROI FLORENDO II is a 71 y.o.-year-old male, returning for follow-up for  DM2, dx in 2000s, insulin-independent since 2018, uncontrolled, with complications (PN, ED).  Last visit 4 months ago.  Interim history: Last year, he was diagnosed with bladder cancer and had transurethral resection on 06/18/2021.  The cancer was high-grade, but noninvasive. He is getting BCG bladder instillations - off and on Eliquis. Since last OV, he had a cancer recurrence >> will have ChTx and restart BCG instillations. As of now, he has a urinary tract infection. He continues on a more plant-based diet, but relaxed the diet. No increased urination, blurry vision, nausea, chest pain. Has hip pain (OA) in the hip.  Reviewed HbA1c levels: Lab Results  Component Value Date   HGBA1C 7.6 (A) 12/06/2021   HGBA1C 6.7 (A) 08/03/2021   HGBA1C 6.5 (A) 01/28/2021   Pt is on a regimen of: - Trulicity 1.5 >> 3 mg weekly. - Metformin 1000 mg 2x a day >> 2000 mg with dinner - Glipizide 2.5 mg before a large dinner We stopped Novolog 70/30 10 units 2x a day in 11/2017, when starting Trulicity.  Pt checks his sugars 1-2 times a day: - am:  99, 110-147, 151, 158, 160 (forgot meds) >> 108-144, 160, 169 >> 106-147, 155, 167 (bladded inf) - 2h after b'fast:134 >> n/c >> 119-147, 157 > 126-155, 182 >> 136-171 - before lunch: 106,138, 194 (PB) >> n/c >> 115, 133 >> 135-142 >> 140-173, 239 - 2h after lunch: 150 >> n/c >> 112-143, 157 >> 144-212, 237 >> 124-168 - before dinner:  110 >> n/c >> 152 >> 120-143 >> 128, 132-159, 207 - 2h after  dinner: 140-172, 190, 217 >> 102-149, 177 >> 138-201 >> 121-162, 188 - bedtime: 106-140 >> 125-130 >> 110-135 >> 113-179 >> 121-138 - nighttime:51/62, 80-126 >> 129 >> 91 (wine) >> 118 >> n/c Lowest sugar was 28 right after starting insulin >> ...58 >> 99 >> 108 >> 106; he has hypoglycemia awareness in the 70s. Highest sugar was 166 >> 217 >> 177>> 237 >> 139.  Glucometer: ReliOn  Pt's meals are: - Breakfast: whole wheat toast + butte - Lunch: tuna salad or tuna sandwich ([employer] delivery), soft taco, meat + baked beans or potatoes - Dinner: [employer] delivery - Snacks: 1-0: nuts, bananas, other fruit He decreased his alcohol intake. He was swimming 5 times a week before the coronavirus pandemic, now exercising at home.  -No CKD; last BUN/creatinine:  Lab Results  Component Value Date   BUN 13 03/15/2022   BUN 14 06/18/2021   CREATININE 0.60 (L) 03/15/2022   CREATININE 0.70 06/18/2021  On lisinopril 40.  -+ HL; last set of lipids: Lab Results  Component Value Date   CHOL 100 04/20/2021   HDL 41.40 04/20/2021   LDLCALC 35 04/20/2021   LDLDIRECT 77.0 07/07/2015   TRIG 119.0 04/20/2021   CHOLHDL 2 04/20/2021  On Lipitor 40 and co-Q10.  - last eye exam was in 12/2021: No DR, + cataract.   -he has numbness and tingling in his feet  He has a h/o arrhythmia (  A fib) - wore an event monitor >> now on beta blocker. Cardiologist: Dr. Acie Fredrickson.  ROS: + See HPI Neurological: no tremors/+ numbness/+ tingling/no dizziness  I reviewed pt's medications, allergies, PMH, social hx, family hx, and changes were documented in the history of present illness. Otherwise, unchanged from my initial visit note.  Past Medical History:  Diagnosis Date   Anticoagulant long-term use    eliquis--- managed by cardiology   Bladder cancer [facility])    urologist--- dr Junious Silk;  overlapping   History of basal cell carcinoma ([place]) excision    History of COVID-19 05/2019   per pt mild symptoms that  resolved   HLD (hyperlipidemia)    HTN (hypertension)    Hx of colonic polyps    Insulin dependent type 2 diabetes mellitus [facility])    endocrinologist--- [name] --  (03-11-2022 per pt check blood sugar 1-2 times daily,  fasting sugar-- 118-120s)   Nocturia    Paroxysmal atrial fibrillation ([place]) 03/12/2019   cardiologist--- [name]   Ureterocele    Past Surgical History:  Procedure Laterality Date   BLEPHAROPLASTY Bilateral    per pt approx  2001;   upper eyelid's   COLONOSCOPY     last one approx 2020   PATELLAR TENDON REPAIR Left 04/16/2004   '@[facility]'$    TRANSURETHRAL RESECTION OF BLADDER TUMOR N/A 06/18/2021   Procedure: TRANSURETHRAL RESECTION OF BLADDER TUMOR (TURBT) WITH RIGHT URETERAL STENT PLACEMENT, RIGHT URETEROSCOPY WITH DISTRACTION OF TUMOR, FULGURATION;  Surgeon: Festus Aloe, MD;  Location: [facility];  Service: Urology;  Laterality: N/A;   TRANSURETHRAL RESECTION OF BLADDER TUMOR N/A 03/15/2022   Procedure: TRANSURETHRAL RESECTION OF BLADDER TUMOR (TURBT) WITH BILATERAL URETEROSCOPY/URETHRAL/ DILATION/BILATERAL RETROGRADE PYELOGRAM/BIOPSY AND FULGURATION OF RIGHT URETERAL CANCER/RIGHT STENT PLACEMENT;  Surgeon: Festus Aloe, MD;  Location: [facility];  Service: Urology;  Laterality: N/A;   Social History   Socioeconomic History   Marital status: Married    Spouse name: Not on file   Number of children: 2  Social Needs  Occupational History   Occupation: DVD's   Occupation: Cabin crew: Self Employed, retired  Tobacco Use   Smoking status: Former Smoker    Types: Cigars    Last attempt to quit: 02/22/2011   Smokeless tobacco: Never Used   Tobacco comment: 2 cigars monthly or less  Substance and Sexual Activity   Alcohol use: Yes     0.0 oz    Vodka, 2-3 drinks 3x a week   Drugs: No   Current Outpatient Medications on File Prior to Visit  Medication Sig Dispense Refill   acetaminophen (TYLENOL) 500 MG  tablet Take 1,000 mg by mouth every 6 (six) hours as needed.     apixaban (ELIQUIS) 5 MG TABS tablet Take 1 tablet (5 mg total) by mouth 2 (two) times daily. 60 tablet 5   atorvastatin (LIPITOR) 40 MG tablet TAKE ONE TABLET BY MOUTH ONCE DAILY (Patient taking differently: Take 40 mg by mouth at bedtime.) 90 tablet 1   b complex vitamins capsule Take 1 capsule by mouth daily.     Dulaglutide (TRULICITY) 3 ZO/1.0RU SOPN Inject 3 mg into the skin once a week. (Patient taking differently: Inject 3 mg into the skin once a week. Tuesday's) 6 mL 3   glipiZIDE (GLUCOTROL) 5 MG tablet Take 0.5 tablets (2.5 mg total) by mouth daily before supper. (Patient taking differently: Take 2.5 mg by mouth daily before supper. Per pt takes  after large meal supper) 45 tablet 3   glucose blood test strip 1 each by Other route daily. Use daily for glucose control - Dx 250.00 100 each 0   hydrochlorothiazide (MICROZIDE) 12.5 MG capsule TAKE ONE CAPSULE BY MOUTH ONCE DAILY (Patient taking differently: Take 12.5 mg by mouth daily.) 90 capsule 1   lisinopril (ZESTRIL) 40 MG tablet TAKE 1 AND 1/2 TABLETS BY MOUTH ONCE DAILY (Patient taking differently: 40 mg daily. TAKE 1 AND 1/2 TABLETS BY MOUTH ONCE DAILY) 135 tablet 0   metFORMIN (GLUCOPHAGE) 1000 MG tablet TAKE ONE TABLET BY MOUTH TWICE DAILY with food (Patient taking differently: Take 1,000 mg by mouth 2 (two) times daily with a meal.) 180 tablet 0   metoprolol succinate (TOPROL-XL) 25 MG 24 hr tablet TAKE ONE TABLET BY MOUTH ONCE DAILY (Patient taking differently: 25 mg at bedtime.) 90 tablet 3   Multiple Vitamin (MULTIVITAMIN) tablet Take 1 tablet by mouth daily.     propranolol (INDERAL) 10 MG tablet Take 1 tablet (10 mg total) by mouth 4 (four) times daily as needed (palpitations, irregular or fast heart rate). 30 tablet 9   sildenafil (REVATIO) 20 MG tablet TAKE 1 TO 2 TABLETS 2 TO 3 HOURS BEFORE SEX (Patient taking differently: as needed. TAKE 1 TO 2 TABLETS 2 TO 3 HOURS  BEFORE SEX) 30 tablet 5   No current facility-administered medications on file prior to visit.   No Known Allergies Family History  Problem Relation Age of Onset   Aortic aneurysm Other        family history   Cancer Other        Breast (1st degree relative)   Pancreatic cancer Mother 40   Colon cancer Neg Hx    Rectal cancer Neg Hx    Stomach cancer Neg Hx    Esophageal cancer Neg Hx     PE: BP 130/86 (BP Location: Right Arm, Patient Position: Sitting, Cuff Size: Normal)   Pulse 78   Ht '6\' 3"'$  (1.905 m)   Wt 209 lb 9.6 oz (95.1 kg)   SpO2 98%   BMI 26.20 kg/m  Wt Readings from Last 3 Encounters:  03/28/22 209 lb 9.6 oz (95.1 kg)  03/15/22 204 lb 1.6 oz (92.6 kg)  01/31/22 209 lb (94.8 kg)   Constitutional: overweight, in NAD Eyes: EOMI, no exophthalmos ENT: moist mucous membranes, no neck masses palpated, no cervical lymphadenopathy Cardiovascular: RRR, No MRG Respiratory: CTA B Musculoskeletal: no deformities Skin: moist, warm, no rashes Neurological: no tremor with outstretched hands Diabetic Foot Exam - Simple   Simple Foot Form Diabetic Foot exam was performed with the following findings: Yes 03/28/2022  1:10 PM  Visual Inspection No deformities, no ulcerations, no other skin breakdown bilaterally: Yes Sensation Testing Intact to touch and monofilament testing bilaterally: Yes Pulse Check Posterior Tibialis and Dorsalis pulse intact bilaterally: Yes Comments    ASSESSMENT: 1. DM2, insulin-independent, uncontrolled, with complications - PN - ED  2. HL  3.  Overweight  PLAN:  1. Patient with longstanding, previously uncontrolled type 2 diabetes, on oral antidiabetic regimen with metformin and also sulfonylurea prn before a larger late dinner, and also weekly GLP-1 receptor agonist.  Sugars improved after moving metformin at night.  At last visit, however, sugars were higher than before at most times of the day with some being in the 200s in the morning.   He was using his glipizide before dinner more frequently.  I advised him to increase the Trulicity  dose.  HbA1c was 7.6%. -At today's visit, sugars are mostly at or slightly above goal, with several exceptions, including when he was off metformin before his CT scan last month and also now during his UTI.  He has some blood sugars after dietary indiscretions in the 200s.  Also, occasionally after meals sugars are higher.  I suggested an extended release glipizide low-dose 2.5 mg daily in the morning.  I advised him to also crush the tablet 15 to 20-minute before a larger meal.  Otherwise, for now we will continue the same regimen. - I suggested to:  Patient Instructions  Please continue: - Metformin 2000 mg with dinner - Trulicity 3 mg weekly  Please use: - Glipizide ER 2.5 mg before breakfast and a crushed tablet before a large meal  Please return in 4 months with your sugar log.   - we checked his HbA1c: 6.7% (loer) - advised to check sugars at different times of the day - 1x a day, rotating check times - advised for yearly eye exams >> he is UTD - return to clinic in 4 months  2.  HL -Reviewed latest lipid panel from 03/2021: All fractions at goal Lab Results  Component Value Date   CHOL 100 04/20/2021   HDL 41.40 04/20/2021   LDLCALC 35 04/20/2021   LDLDIRECT 77.0 07/07/2015   TRIG 119.0 04/20/2021   CHOLHDL 2 04/20/2021  -He is on Lipitor 40 mg daily and co-Q10 without side effects  3.  Overweight -She lost approximately 26 pounds immediately after starting the GLP-1 receptor agonist and after switching to a more plant-based diet -Weight was stable at last visit.  At that time we increased the dose to 3 mg weekly -Since last visit he gained 6 lbs  [name], MD PhD [facility]

## 2022-04-04 ENCOUNTER — Other Ambulatory Visit: Payer: Self-pay

## 2022-04-04 MED ORDER — PROPRANOLOL HCL 10 MG PO TABS: 10.0000 mg | ORAL_TABLET | Freq: Four times a day (QID) | ORAL | 3 refills | Status: DC | PRN

## 2022-04-04 NOTE — Telephone Encounter (Signed)
Pt's medication was sent to pt's pharmacy as requested. Confirmation received.  °

## 2022-04-06 DIAGNOSIS — C661 Malignant neoplasm of right ureter: Secondary | ICD-10-CM | POA: Diagnosis not present

## 2022-04-06 DIAGNOSIS — C678 Malignant neoplasm of overlapping sites of bladder: Secondary | ICD-10-CM | POA: Diagnosis not present

## 2022-04-06 DIAGNOSIS — R311 Benign essential microscopic hematuria: Secondary | ICD-10-CM | POA: Diagnosis not present

## 2022-04-13 ENCOUNTER — Other Ambulatory Visit: Payer: Self-pay | Admitting: Cardiovascular Disease

## 2022-04-13 ENCOUNTER — Other Ambulatory Visit: Payer: Self-pay | Admitting: Internal Medicine

## 2022-04-13 DIAGNOSIS — I480 Paroxysmal atrial fibrillation: Secondary | ICD-10-CM

## 2022-04-13 DIAGNOSIS — I10 Essential (primary) hypertension: Secondary | ICD-10-CM

## 2022-04-13 NOTE — Telephone Encounter (Signed)
Prescription refill request for Eliquis received. Indication: Afib  Last office visit: 02/25/22 Robert [name]) Scr: 0.77 (04/20/21)  Age: 71 Weight: 95.1kg  Appropriate dose and refill sent to requested pharmacy.

## 2022-04-18 ENCOUNTER — Other Ambulatory Visit: Payer: Self-pay | Admitting: Internal Medicine

## 2022-04-18 DIAGNOSIS — I10 Essential (primary) hypertension: Secondary | ICD-10-CM

## 2022-04-18 DIAGNOSIS — E119 Type 2 diabetes mellitus without complications: Secondary | ICD-10-CM

## 2022-04-21 ENCOUNTER — Ambulatory Visit (INDEPENDENT_AMBULATORY_CARE_PROVIDER_SITE_OTHER): Payer: Medicare HMO

## 2022-04-21 DIAGNOSIS — Z0000 Encounter for general adult medical examination without abnormal findings: Secondary | ICD-10-CM | POA: Diagnosis not present

## 2022-04-21 NOTE — Patient Instructions (Signed)
[name] , Thank you for taking time to come for your Medicare Wellness Visit. I appreciate your ongoing commitment to your health goals. Please review the following plan we discussed and let me know if I can assist you in the future.   Screening recommendations/referrals: Colonoscopy: 03/28/2019; due every 3 years (scheduled to see GI 04/28/2022) Recommended yearly ophthalmology/optometry visit for glaucoma screening and checkup Recommended yearly dental visit for hygiene and checkup  Vaccinations: Influenza vaccine: 08/09/2021 Pneumococcal vaccine: 03/01/2017, 07/23/2018 Tdap vaccine: 08/21/2007; due every 10 years; not covered by Medicare as preventative but will cover as treatment for an injury Shingles vaccine: recommend Shingrix which is 2 doses 2-6 months apart and over 90% effective      Covid-19: 01/24/2020, 02/17/2020, 09/26/2020  Advanced directives: Yes; Please bring a copy of your health care power of attorney and living will to the office at your convenience.  Conditions/risks identified: Yes; Client understands the importance of follow-up appointments with providers by attending scheduled visits and discussed goals to eat healthier, increase physical activity 5 times a week for 30 minutes each, exercise the brain by doing stimulating brain exercises (reading, adult coloring, crafting, listening to music, puzzles, etc.), socialize and enjoy life more, get enough sleep at least 8-9 hours average per night and make time for laughter.  Next appointment: Please schedule your next Medicare Wellness Visit with your Nurse Health Advisor in 1 year by calling [phone].  Preventive Care 18 Years and Older, Male Preventive care refers to lifestyle choices and visits with your health care provider that can promote health and wellness. What does preventive care include? A yearly physical exam. This is also called an annual well check. Dental exams once or twice a year. Routine eye exams. Ask  your health care provider how often you should have your eyes checked. Personal lifestyle choices, including: Daily care of your teeth and gums. Regular physical activity. Eating a healthy diet. Avoiding tobacco and drug use. Limiting alcohol use. Practicing safe sex. Taking low doses of aspirin every day. Taking vitamin and mineral supplements as recommended by your health care provider. What happens during an annual well check? The services and screenings done by your health care provider during your annual well check will depend on your age, overall health, lifestyle risk factors, and family history of disease. Counseling  Your health care provider may ask you questions about your: Alcohol use. Tobacco use. Drug use. Emotional well-being. Home and relationship well-being. Sexual activity. Eating habits. History of falls. Memory and ability to understand (cognition). Work and work Statistician. Screening  You may have the following tests or measurements: Height, weight, and BMI. Blood pressure. Lipid and cholesterol levels. These may be checked every 5 years, or more frequently if you are over 76 years old. Skin check. Lung cancer screening. You may have this screening every year starting at age 55 if you have a 30-pack-year history of smoking and currently smoke or have quit within the past 15 years. Fecal occult blood test (FOBT) of the stool. You may have this test every year starting at age 45. Flexible sigmoidoscopy or colonoscopy. You may have a sigmoidoscopy every 5 years or a colonoscopy every 10 years starting at age 64. Prostate cancer screening. Recommendations will vary depending on your family history and other risks. Hepatitis C blood test. Hepatitis B blood test. Sexually transmitted disease (STD) testing. Diabetes screening. This is done by checking your blood sugar (glucose) after you have not eaten for a while (fasting). You  may have this done every 1-3  years. Abdominal aortic aneurysm (AAA) screening. You may need this if you are a current or former smoker. Osteoporosis. You may be screened starting at age 33 if you are at high risk. Talk with your health care provider about your test results, treatment options, and if necessary, the need for more tests. Vaccines  Your health care provider may recommend certain vaccines, such as: Influenza vaccine. This is recommended every year. Tetanus, diphtheria, and acellular pertussis (Tdap, Td) vaccine. You may need a Td booster every 10 years. Zoster vaccine. You may need this after age 83. Pneumococcal 13-valent conjugate (PCV13) vaccine. One dose is recommended after age 44. Pneumococcal polysaccharide (PPSV23) vaccine. One dose is recommended after age 42. Talk to your health care provider about which screenings and vaccines you need and how often you need them. This information is not intended to replace advice given to you by your health care provider. Make sure you discuss any questions you have with your health care provider. Document Released: 11/06/2015 Document Revised: 06/29/2016 Document Reviewed: 08/11/2015 Elsevier Interactive Patient Education  2017 [place] Prevention in the Home Falls can cause injuries. They can happen to people of all ages. There are many things you can do to make your home safe and to help prevent falls. What can I do on the outside of my home? Regularly fix the edges of walkways and driveways and fix any cracks. Remove anything that might make you trip as you walk through a door, such as a raised step or threshold. Trim any bushes or trees on the path to your home. Use bright outdoor lighting. Clear any walking paths of anything that might make someone trip, such as rocks or tools. Regularly check to see if handrails are loose or broken. Make sure that both sides of any steps have handrails. Any raised decks and porches should have guardrails on the  edges. Have any leaves, snow, or ice cleared regularly. Use sand or salt on walking paths during winter. Clean up any spills in your garage right away. This includes oil or grease spills. What can I do in the bathroom? Use night lights. Install grab bars by the toilet and in the tub and shower. Do not use towel bars as grab bars. Use non-skid mats or decals in the tub or shower. If you need to sit down in the shower, use a plastic, non-slip stool. Keep the floor dry. Clean up any water that spills on the floor as soon as it happens. Remove soap buildup in the tub or shower regularly. Attach bath mats securely with double-sided non-slip rug tape. Do not have throw rugs and other things on the floor that can make you trip. What can I do in the bedroom? Use night lights. Make sure that you have a light by your bed that is easy to reach. Do not use any sheets or blankets that are too big for your bed. They should not hang down onto the floor. Have a firm chair that has side arms. You can use this for support while you get dressed. Do not have throw rugs and other things on the floor that can make you trip. What can I do in the kitchen? Clean up any spills right away. Avoid walking on wet floors. Keep items that you use a lot in easy-to-reach places. If you need to reach something above you, use a strong step stool that has a grab bar. Keep electrical  cords out of the way. Do not use floor polish or wax that makes floors slippery. If you must use wax, use non-skid floor wax. Do not have throw rugs and other things on the floor that can make you trip. What can I do with my stairs? Do not leave any items on the stairs. Make sure that there are handrails on both sides of the stairs and use them. Fix handrails that are broken or loose. Make sure that handrails are as long as the stairways. Check any carpeting to make sure that it is firmly attached to the stairs. Fix any carpet that is loose or  worn. Avoid having throw rugs at the top or bottom of the stairs. If you do have throw rugs, attach them to the floor with carpet tape. Make sure that you have a light switch at the top of the stairs and the bottom of the stairs. If you do not have them, ask someone to add them for you. What else can I do to help prevent falls? Wear shoes that: Do not have high heels. Have rubber bottoms. Are comfortable and fit you well. Are closed at the toe. Do not wear sandals. If you use a stepladder: Make sure that it is fully opened. Do not climb a closed stepladder. Make sure that both sides of the stepladder are locked into place. Ask someone to hold it for you, if possible. Clearly mark and make sure that you can see: Any grab bars or handrails. First and last steps. Where the edge of each step is. Use tools that help you move around (mobility aids) if they are needed. These include: Canes. Walkers. Scooters. Crutches. Turn on the lights when you go into a dark area. Replace any light bulbs as soon as they burn out. Set up your furniture so you have a clear path. Avoid moving your furniture around. If any of your floors are uneven, fix them. If there are any pets around you, be aware of where they are. Review your medicines with your doctor. Some medicines can make you feel dizzy. This can increase your chance of falling. Ask your doctor what other things that you can do to help prevent falls. This information is not intended to replace advice given to you by your health care provider. Make sure you discuss any questions you have with your health care provider. Document Released: 08/06/2009 Document Revised: 03/17/2016 Document Reviewed: 11/14/2014 Elsevier Interactive Patient Education  2017 [employer].

## 2022-04-21 NOTE — Progress Notes (Addendum)
I connected with Robert [name] today by telephone and verified that I am speaking with the correct person using two identifiers. Location patient: home Location provider: work Persons participating in the virtual visit: patient, provider.   I discussed the limitations, risks, security and privacy concerns of performing an evaluation and management service by telephone and the availability of in person appointments. I also discussed with the patient that there may be a patient responsible charge related to this service. The patient expressed understanding and verbally consented to this telephonic visit.    Interactive audio and video telecommunications were attempted between this provider and patient, however failed, due to patient having technical difficulties OR patient did not have access to video capability.  We continued and completed visit with audio only.  Some vital signs may be absent or patient reported.   Time Spent with patient on telephone encounter: 30 minutes  Subjective:   Robert [name] is a 71 y.o. male who presents for Medicare Annual/Subsequent preventive examination.  Review of Systems     Cardiac Risk Factors include: advanced age (>19mn, >>72women);diabetes mellitus;dyslipidemia;hypertension;family history of premature cardiovascular disease;male gender     Objective:    There were no vitals filed for this visit. There is no height or weight on file to calculate BMI.     04/21/2022    3:08 PM 03/15/2022    9:20 AM 06/18/2021    8:40 AM 06/03/2018   10:20 PM 08/12/2015   10:16 AM  Advanced Directives  Does Patient Have a Medical Advance Directive? Yes Yes Yes No No  Type of Advance Directive Living will;Healthcare Power of Attorney      Does patient want to make changes to medical advance directive? No - Patient declined No - Patient declined Yes (MAU/Ambulatory/Procedural Areas - Information given)    Copy of [place]?  No - copy requested      Would patient like information on creating a medical advance directive?     No - patient declined information    Current Medications (verified) Outpatient Encounter Medications as of 04/21/2022  Medication Sig   ELIQUIS 5 MG TABS tablet TAKE ONE TABLET BY MOUTH TWICE DAILY   acetaminophen (TYLENOL) 500 MG tablet Take 1,000 mg by mouth every 6 (six) hours as needed.   atorvastatin (LIPITOR) 40 MG tablet TAKE ONE TABLET BY MOUTH ONCE DAILY (Patient taking differently: Take 40 mg by mouth at bedtime.)   b complex vitamins capsule Take 1 capsule by mouth daily.   Dulaglutide (TRULICITY) 3 MDT/2.6ZTSOPN Inject 3 mg into the skin once a week. (Patient taking differently: Inject 3 mg into the skin once a week. Tuesday's)   glipiZIDE (GLUCOTROL XL) 2.5 MG 24 hr tablet Take 1 tablet (2.5 mg total) by mouth 2 (two) times daily before a meal.   glucose blood test strip 1 each by Other route daily. Use daily for glucose control - Dx 250.00   hydrochlorothiazide (MICROZIDE) 12.5 MG capsule TAKE ONE CAPSULE BY MOUTH ONCE DAILY (Patient taking differently: Take 12.5 mg by mouth daily.)   lisinopril (ZESTRIL) 40 MG tablet TAKE 1 AND 1/2 TABLETS BY MOUTH ONCE DAILY (Patient taking differently: 40 mg daily. TAKE 1 AND 1/2 TABLETS BY MOUTH ONCE DAILY)   metFORMIN (GLUCOPHAGE) 1000 MG tablet TAKE ONE TABLET BY MOUTH TWICE DAILY with food (Patient taking differently: Take 1,000 mg by mouth 2 (two) times daily with a meal.)   metoprolol succinate (TOPROL-XL) 25 MG  24 hr tablet TAKE ONE TABLET BY MOUTH ONCE DAILY (Patient taking differently: 25 mg at bedtime.)   Multiple Vitamin (MULTIVITAMIN) tablet Take 1 tablet by mouth daily.   propranolol (INDERAL) 10 MG tablet Take 1 tablet (10 mg total) by mouth 4 (four) times daily as needed (palpitations, irregular or fast heart rate).   sildenafil (REVATIO) 20 MG tablet TAKE 1 TO 2 TABLETS 2 TO 3 HOURS BEFORE SEX (Patient taking differently: as needed.  TAKE 1 TO 2 TABLETS 2 TO 3 HOURS BEFORE SEX)   No facility-administered encounter medications on file as of 04/21/2022.    Allergies (verified) Patient has no known allergies.   History: Past Medical History:  Diagnosis Date   Anticoagulant long-term use    eliquis--- managed by cardiology   Bladder cancer [facility])    urologist--- dr Junious Silk;  overlapping   History of basal cell carcinoma ([place]) excision    History of COVID-19 05/2019   per pt mild symptoms that resolved   HLD (hyperlipidemia)    HTN (hypertension)    Hx of colonic polyps    Insulin dependent type 2 diabetes mellitus [facility])    endocrinologist--- [name] --  (03-11-2022 per pt check blood sugar 1-2 times daily,  fasting sugar-- 118-120s)   Nocturia    Paroxysmal atrial fibrillation ([place]) 03/12/2019   cardiologist--- [name]   Ureterocele    Past Surgical History:  Procedure Laterality Date   BLEPHAROPLASTY Bilateral    per pt approx  2001;   upper eyelid's   COLONOSCOPY     last one approx 2020   PATELLAR TENDON REPAIR Left 04/16/2004   '@[facility]'$    TRANSURETHRAL RESECTION OF BLADDER TUMOR N/A 06/18/2021   Procedure: TRANSURETHRAL RESECTION OF BLADDER TUMOR (TURBT) WITH RIGHT URETERAL STENT PLACEMENT, RIGHT URETEROSCOPY WITH DISTRACTION OF TUMOR, FULGURATION;  Surgeon: Festus Aloe, MD;  Location: [place];  Service: Urology;  Laterality: N/A;   TRANSURETHRAL RESECTION OF BLADDER TUMOR N/A 03/15/2022   Procedure: TRANSURETHRAL RESECTION OF BLADDER TUMOR (TURBT) WITH BILATERAL URETEROSCOPY/URETHRAL/ DILATION/BILATERAL RETROGRADE PYELOGRAM/BIOPSY AND FULGURATION OF RIGHT URETERAL CANCER/RIGHT STENT PLACEMENT;  Surgeon: Festus Aloe, MD;  Location: [facility];  Service: Urology;  Laterality: N/A;   Family History  Problem Relation Age of Onset   Aortic aneurysm Other        family history   Cancer Other        Breast (1st degree relative)   Pancreatic cancer Mother 77    Colon cancer Neg Hx    Rectal cancer Neg Hx    Stomach cancer Neg Hx    Esophageal cancer Neg Hx    Social History   Socioeconomic History   Marital status: Married    Spouse name: Not on file   Number of children: Not on file   Years of education: Not on file   Highest education level: Not on file  Occupational History   Occupation: DVD's   Occupation: Cabin crew: Self Employed  Tobacco Use   Smoking status: Former    Types: Cigars    Quit date: 02/22/2011    Years since quitting: 11.1   Smokeless tobacco: Never  Vaping Use   Vaping Use: Never used  Substance and Sexual Activity   Alcohol use: Yes    Alcohol/week: 0.0 standard drinks of alcohol    Comment: 6-8oz /wk   Drug use: Never   Sexual activity: Not on file  Other Topics Concern  Not on file  Social History Narrative   Patient is married he has been employed in [employer], therapeutic   Former smoker with occasional alcohol use   Social Determinants of Health   Financial Resource Strain: Low Risk  (04/21/2022)   Overall Financial Resource Strain (CARDIA)    Difficulty of Paying Living Expenses: Not hard at all  Food Insecurity: No Food Insecurity (04/21/2022)   Hunger Vital Sign    Worried About Running Out of Food in the Last Year: Never true    [place] in the Last Year: Never true  Transportation Needs: No Transportation Needs (04/21/2022)   PRAPARE - Hydrologist (Medical): No    Lack of Transportation (Non-Medical): No  Physical Activity: Sufficiently Active (04/21/2022)   Exercise Vital Sign    Days of Exercise per Week: 5 days    Minutes of Exercise per Session: 30 min  Stress: No Stress Concern Present (04/21/2022)   [place]    Feeling of Stress : Not at all  Social Connections: [place] (04/21/2022)   Social Connection and Isolation Panel [NHANES]    Frequency of  Communication with Friends and Family: More than three times a week    Frequency of Social Gatherings with Friends and Family: More than three times a week    Attends Religious Services: 1 to 4 times per year    Active Member of [employer] or Organizations: No    Attends Music therapist: 1 to 4 times per year    Marital Status: Married    Tobacco Counseling Counseling given: Not Answered   Clinical Intake:  Pre-visit preparation completed: Yes  Pain : 0-10 Pain Type: Chronic pain Pain Location: Hip Pain Orientation: Right Pain Descriptors / Indicators: Aching Pain Onset: More than a month ago Pain Frequency: Constant     BMI - recorded: 26.2 Nutritional Status: BMI 25 -29 Overweight Nutritional Risks: None Diabetes: Yes CBG done?: No Did pt. bring in CBG monitor from home?: No  How often do you need to have someone help you when you read instructions, pamphlets, or other written materials from your doctor or pharmacy?: 1 - Never What is the last grade level you completed in school?: HSG; 2 years of college  Diabetic? yes  Interpreter Needed?: No  Information entered by :: Lisette Abu, LPN.   Activities of Daily Living    04/21/2022    3:10 PM 03/15/2022    9:23 AM  In your present state of health, do you have any difficulty performing the following activities:  Hearing? 0 0  Vision? 0 0  Difficulty concentrating or making decisions? 0 0  Walking or climbing stairs? 0 1  Dressing or bathing? 0 0  Doing errands, shopping? 0   Preparing Food and eating ? N   Using the Toilet? N   In the past six months, have you accidently leaked urine? N   Do you have problems with loss of bowel control? N   Managing your Medications? N   Managing your Finances? N   Housekeeping or managing your Housekeeping? N     Patient Care Team: Plotnikov, Evie Lacks, MD as PCP - General (Internal Medicine) Nahser, [name], MD as PCP - Cardiology (Cardiology) Viona Gilmore, [facility] as Pharmacist (Pharmacist) [name], MD as Consulting Physician (Internal Medicine) Festus Aloe, MD as Consulting Physician (Urology) Gatha Mayer, MD as Consulting  Physician (Gastroenterology) Marygrace Drought, MD as Consulting Physician (Ophthalmology)  Indicate any recent Medical Services you may have received from other than [facility] providers in the past year (date may be approximate).     Assessment:   This is a routine wellness examination for Robert [name].  Hearing/Vision screen Hearing Screening - Comments:: Patient denied any hearing difficulty.   No hearing aids.  Vision Screening - Comments:: Patient does wears readers. Eye exam done by: Marygrace Drought, MD.   Dietary issues and exercise activities discussed: Current Exercise Habits: Home exercise routine, Type of exercise: walking;Other - see comments (yard work, gardening, walking in the neighborhood), Time (Minutes): 30, Frequency (Times/Week): 5, Weekly Exercise (Minutes/Week): 150, Intensity: Mild, Exercise limited by: orthopedic condition(s) (right hip pain)   Goals Addressed             This Visit's Progress    Just recently diagnosed with bladder cancer and I hope everything turns around for me.  I start my treatment in a couple of weeks for 6  weeks.        Depression Screen    04/21/2022    3:06 PM 01/31/2022    2:08 PM 04/20/2021    9:51 AM 02/06/2020    9:55 AM 07/23/2018   11:37 AM 03/01/2017    9:49 AM 11/05/2015    9:39 AM  PHQ 2/9 Scores  PHQ - 2 Score 0 0 0 4 0 0 0  PHQ- 9 Score   0 5       Fall Risk    04/21/2022    3:09 PM 01/31/2022    2:08 PM 04/20/2021    9:51 AM 02/06/2020    9:26 AM 07/23/2018   11:37 AM  Fall Risk   Falls in the past year? 0 0 0 0 No  Number falls in past yr: 0 0  0   Injury with Fall? 0 0  0   Risk for fall due to : No Fall Risks No Fall Risks     Follow up Falls evaluation completed Falls evaluation completed       [place]:  Any stairs in or around the home? Yes  If so, are there any without handrails? No  Home free of loose throw rugs in walkways, pet beds, electrical cords, etc? Yes  Adequate lighting in your home to reduce risk of falls? Yes   ASSISTIVE DEVICES UTILIZED TO PREVENT FALLS:  Life alert? No  Use of a cane, walker or w/c? No  Grab bars in the bathroom? No  Shower chair or bench in shower? No  Elevated toilet seat or a handicapped toilet? Yes   TIMED UP AND GO:  Was the test performed? No .  Length of time to ambulate 10 feet: n/a sec.   Appearance of gait: Patient not evaluated for gait during this visit.  Cognitive Function:        04/21/2022    3:12 PM  6CIT Screen  What Year? 0 points  What month? 0 points  What time? 0 points  Count back from 20 0 points  Months in reverse 0 points  Repeat phrase 0 points  Total Score 0 points    Immunizations Immunization History  Administered Date(s) Administered   Fluad Quad(high Dose 65+) 07/02/2019, 08/07/2020, 08/09/2021   Influenza Split 09/26/2011, 11/26/2012   Influenza Whole 08/21/2007   Influenza, High Dose Seasonal PF 07/10/2017, 07/23/2018   Influenza,inj,Quad PF,6+ Mos 08/06/2013, 07/28/2015  PFIZER(Purple Top)SARS-COV-2 Vaccination 01/24/2020, 02/17/2020, 09/26/2020   Pneumococcal Conjugate-13 03/01/2017   Pneumococcal Polysaccharide-23 07/23/2018   Td 08/21/2007   Zoster, Live 02/05/2013    TDAP status: Due, Education has been provided regarding the importance of this vaccine. Advised may receive this vaccine at local pharmacy or Health Dept. Aware to provide a copy of the vaccination record if obtained from local pharmacy or Health Dept. Verbalized acceptance and understanding.  Flu Vaccine status: Up to date  Pneumococcal vaccine status: Up to date  Covid-19 vaccine status: Completed vaccines  Qualifies for Shingles Vaccine? Yes   Zostavax completed Yes   Shingrix Completed?: No.     Education has been provided regarding the importance of this vaccine. Patient has been advised to call insurance company to determine out of pocket expense if they have not yet received this vaccine. Advised may also receive vaccine at local pharmacy or Health Dept. Verbalized acceptance and understanding.  Screening Tests Health Maintenance  Topic Date Due   Hepatitis C Screening  Never done   Zoster Vaccines- Shingrix (1 of 2) Never done   COVID-19 Vaccine (4 - Booster for Pfizer series) 11/21/2020   COLONOSCOPY (Pts 45-17yr Insurance coverage will need to be confirmed)  03/27/2022   TETANUS/TDAP  09/11/2038 (Originally 08/20/2017)   INFLUENZA VACCINE  05/24/2022   HEMOGLOBIN A1C  09/27/2022   OPHTHALMOLOGY EXAM  12/25/2022   FOOT EXAM  03/29/2023   Pneumonia Vaccine 71 Years old  Completed   HPV VACCINES  Aged Out    Health Maintenance  Health Maintenance Due  Topic Date Due   Hepatitis C Screening  Never done   Zoster Vaccines- Shingrix (1 of 2) Never done   COVID-19 Vaccine (4 - Booster for PExtonseries) 11/21/2020   COLONOSCOPY (Pts 45-443yrInsurance coverage will need to be confirmed)  03/27/2022    Colorectal cancer screening: Type of screening: Colonoscopy. Completed 03/28/2019. Repeat every 3 years  Lung Cancer Screening: (Low Dose CT Chest recommended if Age 223-80ears, 30 pack-year currently smoking OR have quit w/in 15years.) does not qualify.   Lung Cancer Screening Referral: no  Additional Screening:  Hepatitis C Screening: does qualify; Completed no  Vision Screening: Recommended annual ophthalmology exams for early detection of glaucoma and other disorders of the eye. Is the patient up to date with their annual eye exam?  Yes  Who is the provider or what is the name of the office in which the patient attends annual eye exams? [facility]. If pt is not established with a provider, would they like to be referred to a provider to establish care? No .    Dental Screening: Recommended annual dental exams for proper oral hygiene  Community Resource Referral / Chronic Care Management: CRR required this visit?  No   CCM required this visit?  No      Plan:     I have personally reviewed and noted the following in the patient's chart:   Medical and social history Use of alcohol, tobacco or illicit drugs  Current medications and supplements including opioid prescriptions. Patient is not currently taking opioid prescriptions. Functional ability and status Nutritional status Physical activity Advanced directives List of other physicians Hospitalizations, surgeries, and ER visits in previous 12 months Vitals Screenings to include cognitive, depression, and falls Referrals and appointments  In addition, I have reviewed and discussed with patient certain preventive protocols, quality metrics, and best practice recommendations. A written personalized care plan for preventive services as well as  general preventive health recommendations were provided to patient.     Sheral Flow, LPN   12/17/4973   Nurse Notes:  Patient is cogitatively intact. There were no vitals filed for this visit. There is no height or weight on file to calculate BMI. Patient stated that he has no issues with gait or balance; does not use any assistive devices.   Medical screening examination/treatment/procedure(s) were performed by non-physician practitioner and as supervising physician I was immediately available for consultation/collaboration.  I agree with above. Lew Dawes, MD

## 2022-04-28 ENCOUNTER — Ambulatory Visit: Payer: Medicare HMO | Admitting: Physician Assistant

## 2022-04-28 ENCOUNTER — Encounter: Payer: Self-pay | Admitting: Physician Assistant

## 2022-04-28 VITALS — BP 118/66 | HR 77 | Ht 75.0 in | Wt 201.0 lb

## 2022-04-28 DIAGNOSIS — Z86010 Personal history of colonic polyps: Secondary | ICD-10-CM | POA: Diagnosis not present

## 2022-04-28 DIAGNOSIS — Z7901 Long term (current) use of anticoagulants: Secondary | ICD-10-CM

## 2022-04-28 DIAGNOSIS — Z860101 Personal history of adenomatous and serrated colon polyps: Secondary | ICD-10-CM

## 2022-04-28 NOTE — Progress Notes (Signed)
Subjective:    Patient ID: Robert [name], male    DOB: 30-Jan-1951, 71 y.o.   MRN: [id number]  Robert [name] is a pleasant 71 year old white male, known to Dr. Carlean [name] who comes in today to discuss follow-up colonoscopy in setting of chronic anticoagulation with Eliquis. Patient last had colonoscopy in June 2020 for history of adenomatous polyps.  He was found to have a 20 mm polyp in the descending colon which was removed piecemeal and then endoclipped and had 2 diminutive polyps removed from the sigmoid colon and was also noted to have multiple diverticuli. Biopsies show the large polyp to be a tubular adenoma, one of the diminutive polyps was a tubular adenoma and the [name] was hyperplastic.  He is indicated for 3-year interval follow-up. Patient has history of atrial fibrillation, followed by Dr. Acie [name], and on Eliquis.  Last echo 2019 with EF 60 to 65% and no aortic stenosis. He also has history of hypertension, adult onset diabetes mellitus and recurrent bladder cancer.  He says he had surgery in August 2023 and again in May 2023 for recurrence.  He currently has a stent in one of his ureters, and is to start treatment with BCG weekly x6 weeks which will start next week.  He has been seeing some blood in his urine intermittently. No complaints of abdominal pain, no melena or hematochezia.  He says he has had occasional episodes of diarrhea over the past months but he thinks that this is related to the dosing of his metformin which was changed from twice daily to once daily at a higher dose.  Review of Systems Pertinent positive and negative review of systems were noted in the above HPI section.  All [name] review of systems was otherwise negative.   Outpatient Encounter Medications as of 04/28/2022  Medication Sig   acetaminophen (TYLENOL) 500 MG tablet Take 1,000 mg by mouth every 6 (six) hours as needed.   atorvastatin (LIPITOR) 40 MG tablet TAKE ONE TABLET BY MOUTH ONCE DAILY (Patient  taking differently: Take 40 mg by mouth at bedtime.)   b complex vitamins capsule Take 1 capsule by mouth daily.   Dulaglutide (TRULICITY) 3 OO/8.7NZ SOPN Inject 3 mg into the skin once a week. (Patient taking differently: Inject 3 mg into the skin once a week. Tuesday's)   ELIQUIS 5 MG TABS tablet TAKE ONE TABLET BY MOUTH TWICE DAILY   glucose blood test strip 1 each by [name] route daily. Use daily for glucose control - Dx 250.00   hydrochlorothiazide (MICROZIDE) 12.5 MG capsule TAKE ONE CAPSULE BY MOUTH ONCE DAILY (Patient taking differently: Take 12.5 mg by mouth daily.)   lisinopril (ZESTRIL) 40 MG tablet TAKE 1 AND 1/2 TABLETS BY MOUTH ONCE DAILY (Patient taking differently: 40 mg daily. TAKE 1 AND 1/2 TABLETS BY MOUTH ONCE DAILY)   metFORMIN (GLUCOPHAGE) 1000 MG tablet TAKE ONE TABLET BY MOUTH TWICE DAILY with food (Patient taking differently: Take 1,000 mg by mouth 2 (two) times daily with a meal.)   metoprolol succinate (TOPROL-XL) 25 MG 24 hr tablet TAKE ONE TABLET BY MOUTH ONCE DAILY (Patient taking differently: 25 mg at bedtime.)   Multiple Vitamin (MULTIVITAMIN) tablet Take 1 tablet by mouth daily.   propranolol (INDERAL) 10 MG tablet Take 1 tablet (10 mg total) by mouth 4 (four) times daily as needed (palpitations, irregular or fast heart rate).   sildenafil (REVATIO) 20 MG tablet TAKE 1 TO 2 TABLETS 2 TO 3 HOURS BEFORE SEX (Patient taking  differently: as needed. TAKE 1 TO 2 TABLETS 2 TO 3 HOURS BEFORE SEX)   glipiZIDE (GLUCOTROL XL) 2.5 MG 24 hr tablet Take 1 tablet (2.5 mg total) by mouth 2 (two) times daily before a meal. (Patient taking differently: Take 2.5 mg by mouth 2 (two) times daily before a meal. Patient takes as need)   No facility-administered encounter medications on file as of 04/28/2022.   No Known Allergies Patient Active Problem List   Diagnosis Date Noted   Bladder cancer ([place]) 01/31/2022   Primary osteoarthritis of right hip 10/19/2021   Long term current use of  anticoagulant -Eliquis 03/12/2019   Paroxysmal atrial fibrillation ([place]) 03/12/2019   Overweight (BMI 25.0-29.9) 01/03/2019   Type 2 diabetes mellitus without complication, without long-term current use of insulin ([place]) 07/23/2018   BPH associated with nocturia 03/01/2017   Irregular heart rate 06/23/2011   Hx of colonic polyps    ERECTILE DYSFUNCTION 12/19/2008   SKIN CANCER, HX OF 12/19/2008   Hyperlipidemia 08/21/2007   Essential hypertension 08/21/2007   Social History   Socioeconomic History   Marital status: Married    Spouse name: Not on file   Number of children: Not on file   Years of education: Not on file   Highest education level: Not on file  Occupational History   Occupation: DVD's   Occupation: Cabin crew: Self Employed  Tobacco Use   Smoking status: Some Days    Types: Cigars    Last attempt to quit: 02/22/2011    Years since quitting: 11.1   Smokeless tobacco: Never  Vaping Use   Vaping Use: Never used  Substance and Sexual Activity   Alcohol use: Yes    Alcohol/week: 0.0 standard drinks of alcohol    Comment: 6-8oz /wk   Drug use: Never   Sexual activity: Not on file  [name] Topics Concern   Not on file  Social History Narrative   Patient is married he has been employed in [employer], therapeutic   Former smoker with occasional alcohol use   Social Determinants of Health   Financial Resource Strain: Low Risk  (04/21/2022)   Overall Financial Resource Strain (CARDIA)    Difficulty of Paying Living Expenses: Not hard at all  Food Insecurity: No Food Insecurity (04/21/2022)   Hunger Vital Sign    Worried About Running Out of Food in the Last Year: Never true    [place] in the Last Year: Never true  Transportation Needs: No Transportation Needs (04/21/2022)   PRAPARE - Hydrologist (Medical): No    Lack of Transportation (Non-Medical): No  Physical Activity: Sufficiently Active (04/21/2022)   Exercise  Vital Sign    Days of Exercise per Week: 5 days    Minutes of Exercise per Session: 30 min  Stress: No Stress Concern Present (04/21/2022)   [place]    Feeling of Stress : Not at all  Social Connections: [place] (04/21/2022)   Social Connection and Isolation Panel [NHANES]    Frequency of Communication with Friends and Family: More than three times a week    Frequency of Social Gatherings with Friends and Family: More than three times a week    Attends Religious Services: 1 to 4 times per year    Active Member of [employer] or Organizations: No    Attends Archivist Meetings: 1 to 4 times per  year    Marital Status: Married  Human resources officer Violence: Not At Risk (04/21/2022)   Humiliation, Afraid, Rape, and Kick questionnaire    Fear of Current or Ex-Partner: No    Emotionally Abused: No    Physically Abused: No    Sexually Abused: No    Robert [name] family history includes Aortic aneurysm in an [name] family member; Cancer in an [name] family member; Pancreatic cancer (age of onset: 21) in his mother.      Objective:    Vitals:   04/28/22 1405  BP: 118/66  Pulse: 77  SpO2: 97%    Physical Exam Well-developed well-nourished older WM in no acute distress.  Height, Weight,201 BMI 25.1  HEENT; nontraumatic normocephalic, EOMI, PE R LA, sclera anicteric. Oropharynx;not done Neck; supple, no JVD Cardiovascular; irregular rate and rhythm with S1-S2, no murmur rub or gallop Pulmonary; Clear bilaterally Abdomen; soft, nontender, nondistended, no palpable mass or hepatosplenomegaly, bowel sounds are active Rectal;not done today  Skin; benign exam, no jaundice rash or appreciable lesions Extremities; no clubbing cyanosis or edema skin warm and dry Neuro/Psych; alert and oriented x4, grossly nonfocal mood and affect appropriate        Assessment & Plan:   #21 71 year old white male with history  of adenomatous colon polyps-last colonoscopy June 2020 with removal of a 20 mm tubular adenoma, and 2 [name] diminutive polyps, indicated for 3-year interval follow-up.  #2 chronic anticoagulation-on Eliquis #3 atrial fibrillation #4 last echo 2019 EF 60 to 65%/no AAS #5 hypertension #6.  Adult onset diabetes mellitus #7.  History of bladder CA-recurrent, he had recent ureteral stent placement, and is to start BCG treatments weekly x6 weeks next week. #8 chronic tobacco use  Plan; patient will be scheduled for colonoscopy with Dr. Carlean [name] in the [facility].  Procedure was discussed in detail with the patient including indications risks and benefits and he is agreeable to proceed.  Will aim towards the end of July after he completes the BCG treatments for bladder CA. Patient will need to hold Eliquis for 48 hours prior to the procedure.  We will communicate with his cardiologist Dr. Acie [name] to assure this is reasonable for this patient.  Brayleigh Rybacki S Scotti Kosta PA-C 04/28/2022   Cc: Plotnikov, Evie Lacks, MD

## 2022-04-29 ENCOUNTER — Telehealth: Payer: Self-pay

## 2022-04-29 DIAGNOSIS — C679 Malignant neoplasm of bladder, unspecified: Secondary | ICD-10-CM

## 2022-04-29 DIAGNOSIS — Z86010 Personal history of colonic polyps: Secondary | ICD-10-CM

## 2022-04-29 NOTE — Telephone Encounter (Signed)
Left voicemail to return phone call to schedule his Colonoscopy with Dr. Carlean Purl for the end of August. Will need to send anti-coagulation clearance once shceduled.

## 2022-05-05 NOTE — Telephone Encounter (Signed)
06-17-22, [place], 1:30 pm

## 2022-05-06 DIAGNOSIS — R8271 Bacteriuria: Secondary | ICD-10-CM | POA: Diagnosis not present

## 2022-05-06 DIAGNOSIS — Z5111 Encounter for antineoplastic chemotherapy: Secondary | ICD-10-CM | POA: Diagnosis not present

## 2022-05-06 DIAGNOSIS — R310 Gross hematuria: Secondary | ICD-10-CM | POA: Diagnosis not present

## 2022-05-09 ENCOUNTER — Telehealth: Payer: Self-pay

## 2022-05-09 ENCOUNTER — Encounter: Payer: Self-pay | Admitting: Internal Medicine

## 2022-05-09 NOTE — Telephone Encounter (Signed)
Instructions have been printed to mail to patient today. I have left a voicemail to let patient know that he should be receiving them soon. Clearance for Eliquis has been sent to Cardiology. Order has been placed for insurance authorization.

## 2022-05-09 NOTE — Telephone Encounter (Signed)
Patient with diagnosis of afib on Eliquis for anticoagulation.    Procedure: colonoscopy Date of procedure: 06/17/22  CHA2DS2-VASc Score = 3  This indicates a 3.2% annual risk of stroke. The patient's score is based upon: CHF History: 0 HTN History: 1 Diabetes History: 1 Stroke History: 0 Vascular Disease History: 0 Age Score: 1 Gender Score: 0  CrCl >134m/min Platelet count 177K  Per office protocol, patient can hold Eliquis for 2 days prior to procedure as requested.    **This guidance is not considered finalized until pre-operative APP has relayed final recommendations.**

## 2022-05-09 NOTE — Telephone Encounter (Signed)
[facility] Pre-operative Risk Assessment     Request for surgical clearance:     Endoscopy Procedure  What type of surgery is being performed?     Colonoscopy  When is this surgery scheduled?     06/17/2022  What type of clearance is required ?   Pharmacy  Are there any medications that need to be held prior to surgery and how long? Eliquis starting 2 days prior  Practice name and name of physician performing surgery?      [facility]  What is your office phone and fax number?      Phone- [phone]  [phone]  Anesthesia type (None, local, MAC, general) ?       MAC

## 2022-05-11 NOTE — Telephone Encounter (Signed)
Sent message to patient on My cahrt to advise his to hold Eliquis. I will check to make sure he did read the message.

## 2022-05-12 NOTE — Telephone Encounter (Signed)
Reviewed message sent to patient about Eliquis hold. Patient has seen his message.

## 2022-05-13 DIAGNOSIS — C678 Malignant neoplasm of overlapping sites of bladder: Secondary | ICD-10-CM | POA: Diagnosis not present

## 2022-05-13 DIAGNOSIS — Z5111 Encounter for antineoplastic chemotherapy: Secondary | ICD-10-CM | POA: Diagnosis not present

## 2022-05-17 ENCOUNTER — Other Ambulatory Visit: Payer: Self-pay | Admitting: Internal Medicine

## 2022-05-17 DIAGNOSIS — E119 Type 2 diabetes mellitus without complications: Secondary | ICD-10-CM

## 2022-05-17 DIAGNOSIS — I10 Essential (primary) hypertension: Secondary | ICD-10-CM

## 2022-05-19 ENCOUNTER — Other Ambulatory Visit: Payer: Self-pay | Admitting: Internal Medicine

## 2022-05-19 DIAGNOSIS — E119 Type 2 diabetes mellitus without complications: Secondary | ICD-10-CM

## 2022-05-19 DIAGNOSIS — I10 Essential (primary) hypertension: Secondary | ICD-10-CM

## 2022-05-20 ENCOUNTER — Other Ambulatory Visit: Payer: Self-pay | Admitting: Internal Medicine

## 2022-05-20 DIAGNOSIS — I10 Essential (primary) hypertension: Secondary | ICD-10-CM

## 2022-05-20 DIAGNOSIS — E119 Type 2 diabetes mellitus without complications: Secondary | ICD-10-CM

## 2022-05-20 DIAGNOSIS — Z5111 Encounter for antineoplastic chemotherapy: Secondary | ICD-10-CM | POA: Diagnosis not present

## 2022-05-20 DIAGNOSIS — C678 Malignant neoplasm of overlapping sites of bladder: Secondary | ICD-10-CM | POA: Diagnosis not present

## 2022-05-24 ENCOUNTER — Other Ambulatory Visit: Payer: Self-pay | Admitting: Internal Medicine

## 2022-05-24 DIAGNOSIS — I10 Essential (primary) hypertension: Secondary | ICD-10-CM

## 2022-05-24 DIAGNOSIS — E119 Type 2 diabetes mellitus without complications: Secondary | ICD-10-CM

## 2022-05-27 DIAGNOSIS — Z5111 Encounter for antineoplastic chemotherapy: Secondary | ICD-10-CM | POA: Diagnosis not present

## 2022-05-27 DIAGNOSIS — C678 Malignant neoplasm of overlapping sites of bladder: Secondary | ICD-10-CM | POA: Diagnosis not present

## 2022-06-01 NOTE — Telephone Encounter (Signed)
Noted../lmb 

## 2022-06-03 DIAGNOSIS — C678 Malignant neoplasm of overlapping sites of bladder: Secondary | ICD-10-CM | POA: Diagnosis not present

## 2022-06-03 DIAGNOSIS — Z5111 Encounter for antineoplastic chemotherapy: Secondary | ICD-10-CM | POA: Diagnosis not present

## 2022-06-10 DIAGNOSIS — C678 Malignant neoplasm of overlapping sites of bladder: Secondary | ICD-10-CM | POA: Diagnosis not present

## 2022-06-10 DIAGNOSIS — Z5111 Encounter for antineoplastic chemotherapy: Secondary | ICD-10-CM | POA: Diagnosis not present

## 2022-06-10 DIAGNOSIS — R8271 Bacteriuria: Secondary | ICD-10-CM | POA: Diagnosis not present

## 2022-06-16 NOTE — Progress Notes (Signed)
[facility] History and Physical   Primary Care Physician:  Cassandria Anger, MD   Reason for Procedure:   Hx polyps  Plan:    colonoscopy     HPI: Robert [name] is a 71 y.o. male w/history of adenomatous colon polyps-last colonoscopy June 2020 with removal of a 20 mm tubular adenoma, and 2 other diminutive polyps, indicated for 3-year interval follow-up. Eliquis was held  Past Medical History:  Diagnosis Date   Anticoagulant long-term use    eliquis--- managed by cardiology   Bladder cancer [facility])    urologist--- dr Junious Silk;  overlapping   History of basal cell carcinoma ([place]) excision    History of COVID-19 05/2019   per pt mild symptoms that resolved   HLD (hyperlipidemia)    HTN (hypertension)    Hx of colonic polyps    Insulin dependent type 2 diabetes mellitus [facility])    endocrinologist--- [name] --  (03-11-2022 per pt check blood sugar 1-2 times daily,  fasting sugar-- 118-120s)   Nocturia    Paroxysmal atrial fibrillation ([place]) 03/12/2019   cardiologist--- [name]   Ureterocele     Past Surgical History:  Procedure Laterality Date   BLEPHAROPLASTY Bilateral    per pt approx  2001;   upper eyelid's   COLONOSCOPY     last one approx 2020   PATELLAR TENDON REPAIR Left 04/16/2004   '@[facility]'$    TRANSURETHRAL RESECTION OF BLADDER TUMOR N/A 06/18/2021   Procedure: TRANSURETHRAL RESECTION OF BLADDER TUMOR (TURBT) WITH RIGHT URETERAL STENT PLACEMENT, RIGHT URETEROSCOPY WITH DISTRACTION OF TUMOR, FULGURATION;  Surgeon: Festus Aloe, MD;  Location: [place];  Service: Urology;  Laterality: N/A;   TRANSURETHRAL RESECTION OF BLADDER TUMOR N/A 03/15/2022   Procedure: TRANSURETHRAL RESECTION OF BLADDER TUMOR (TURBT) WITH BILATERAL URETEROSCOPY/URETHRAL/ DILATION/BILATERAL RETROGRADE PYELOGRAM/BIOPSY AND FULGURATION OF RIGHT URETERAL CANCER/RIGHT STENT PLACEMENT;  Surgeon: Festus Aloe, MD;  Location: [facility];   Service: Urology;  Laterality: N/A;    Prior to Admission medications   Medication Sig Start Date End Date Taking? Authorizing Provider  acetaminophen (TYLENOL) 500 MG tablet Take 1,000 mg by mouth every 6 (six) hours as needed.    [provider]  atorvastatin (LIPITOR) 40 MG tablet TAKE ONE TABLET BY MOUTH ONCE DAILY Patient taking differently: Take 40 mg by mouth at bedtime. 02/18/22   [name], MD  b complex vitamins capsule Take 1 capsule by mouth daily.    [provider]  Dulaglutide (TRULICITY) 3 RW/4.3XV SOPN Inject 3 mg into the skin once a week. Patient taking differently: Inject 3 mg into the skin once a week. Tuesday's 12/06/21   [name], MD  ELIQUIS 5 MG TABS tablet TAKE ONE TABLET BY MOUTH TWICE DAILY 04/13/22   Nahser, [name], MD  glipiZIDE (GLUCOTROL XL) 2.5 MG 24 hr tablet Take 1 tablet (2.5 mg total) by mouth 2 (two) times daily before a meal. Patient taking differently: Take 2.5 mg by mouth 2 (two) times daily before a meal. Patient takes as need 03/28/22   [name], MD  glucose blood test strip 1 each by Other route daily. Use daily for glucose control - Dx 250.00 04/21/20   Isaac Bliss, Rayford Halsted, MD  hydrochlorothiazide (MICROZIDE) 12.5 MG capsule TAKE ONE CAPSULE BY MOUTH ONCE DAILY Patient taking differently: Take 12.5 mg by mouth daily. 10/13/21   Isaac Bliss, Rayford Halsted, MD  lisinopril (ZESTRIL) 40 MG tablet TAKE 1 AND 1/2 TABLETS BY MOUTH  ONCE DAILY Patient taking differently: 40 mg daily. TAKE 1 AND 1/2 TABLETS BY MOUTH ONCE DAILY 03/10/22   Isaac Bliss, Rayford Halsted, MD  metFORMIN (GLUCOPHAGE) 1000 MG tablet Take 1 tablet (1,000 mg total) by mouth 2 (two) times daily with a meal. 05/25/22   [name], MD  metoprolol succinate (TOPROL-XL) 25 MG 24 hr tablet TAKE ONE TABLET BY MOUTH ONCE DAILY Patient taking differently: 25 mg at bedtime. 09/14/21   Nahser, [name], MD  Multiple Vitamin (MULTIVITAMIN) tablet  Take 1 tablet by mouth daily.    [provider]  propranolol (INDERAL) 10 MG tablet Take 1 tablet (10 mg total) by mouth 4 (four) times daily as needed (palpitations, irregular or fast heart rate). 04/04/22   Nahser, [name], MD  sildenafil (REVATIO) 20 MG tablet TAKE 1 TO 2 TABLETS 2 TO 3 HOURS BEFORE SEX Patient taking differently: as needed. TAKE 1 TO 2 TABLETS 2 TO 3 HOURS BEFORE SEX 04/21/20   Isaac Bliss, Rayford Halsted, MD    Current Outpatient Medications  Medication Sig Dispense Refill   atorvastatin (LIPITOR) 40 MG tablet TAKE ONE TABLET BY MOUTH ONCE DAILY (Patient taking differently: Take 40 mg by mouth at bedtime.) 90 tablet 1   glucose blood test strip 1 each by Other route daily. Use daily for glucose control - Dx 250.00 100 each 0   hydrochlorothiazide (MICROZIDE) 12.5 MG capsule TAKE ONE CAPSULE BY MOUTH ONCE DAILY (Patient taking differently: Take 12.5 mg by mouth daily.) 90 capsule 1   lisinopril (ZESTRIL) 40 MG tablet TAKE 1 AND 1/2 TABLETS BY MOUTH ONCE DAILY (Patient taking differently: 40 mg daily. TAKE 1 AND 1/2 TABLETS BY MOUTH ONCE DAILY) 135 tablet 0   metFORMIN (GLUCOPHAGE) 1000 MG tablet Take 1 tablet (1,000 mg total) by mouth 2 (two) times daily with a meal. 180 tablet 3   metoprolol succinate (TOPROL-XL) 25 MG 24 hr tablet TAKE ONE TABLET BY MOUTH ONCE DAILY (Patient taking differently: 25 mg at bedtime.) 90 tablet 3   propranolol (INDERAL) 10 MG tablet Take 1 tablet (10 mg total) by mouth 4 (four) times daily as needed (palpitations, irregular or fast heart rate). 90 tablet 3   acetaminophen (TYLENOL) 500 MG tablet Take 1,000 mg by mouth every 6 (six) hours as needed.     b complex vitamins capsule Take 1 capsule by mouth daily.     Dulaglutide (TRULICITY) 3 GU/4.4IH SOPN Inject 3 mg into the skin once a week. (Patient taking differently: Inject 3 mg into the skin once a week. Tuesday's) 6 mL 3   ELIQUIS 5 MG TABS tablet TAKE ONE TABLET BY MOUTH TWICE DAILY  60 tablet 5   glipiZIDE (GLUCOTROL XL) 2.5 MG 24 hr tablet Take 1 tablet (2.5 mg total) by mouth 2 (two) times daily before a meal. (Patient taking differently: Take 2.5 mg by mouth 2 (two) times daily before a meal. Patient takes as need) 180 tablet 3   Multiple Vitamin (MULTIVITAMIN) tablet Take 1 tablet by mouth daily.     sildenafil (REVATIO) 20 MG tablet TAKE 1 TO 2 TABLETS 2 TO 3 HOURS BEFORE SEX (Patient taking differently: as needed. TAKE 1 TO 2 TABLETS 2 TO 3 HOURS BEFORE SEX) 30 tablet 5   Current Facility-Administered Medications  Medication Dose Route Frequency Provider Last Rate Last Admin   0.9 %  sodium chloride infusion  500 mL Intravenous Once Gatha Mayer, MD        Allergies as  of 06/17/2022   (No Known Allergies)    Family History  Problem Relation Age of Onset   Aortic aneurysm Other        family history   Cancer Other        Breast (1st degree relative)   Pancreatic cancer Mother 13   Colon cancer Neg Hx    Rectal cancer Neg Hx    Stomach cancer Neg Hx    Esophageal cancer Neg Hx     Social History   Socioeconomic History   Marital status: Married    Spouse name: Not on file   Number of children: Not on file   Years of education: Not on file   Highest education level: Not on file  Occupational History   Occupation: DVD's   Occupation: Cabin crew: Self Employed  Tobacco Use   Smoking status: Some Days    Types: Cigars    Last attempt to quit: 02/22/2011    Years since quitting: 11.3   Smokeless tobacco: Never  Vaping Use   Vaping Use: Never used  Substance and Sexual Activity   Alcohol use: Yes    Alcohol/week: 0.0 standard drinks of alcohol    Comment: 6-8oz /wk   Drug use: Never   Sexual activity: Not on file  Other Topics Concern   Not on file  Social History Narrative   Patient is married he has been employed in [employer], therapeutic   Former smoker with occasional alcohol use   Social Determinants of Health    Financial Resource Strain: Low Risk  (04/21/2022)   Overall Financial Resource Strain (CARDIA)    Difficulty of Paying Living Expenses: Not hard at all  Food Insecurity: No Food Insecurity (04/21/2022)   Hunger Vital Sign    Worried About Running Out of Food in the Last Year: Never true    [place] in the Last Year: Never true  Transportation Needs: No Transportation Needs (04/21/2022)   PRAPARE - Hydrologist (Medical): No    Lack of Transportation (Non-Medical): No  Physical Activity: Sufficiently Active (04/21/2022)   Exercise Vital Sign    Days of Exercise per Week: 5 days    Minutes of Exercise per Session: 30 min  Stress: No Stress Concern Present (04/21/2022)   [place]    Feeling of Stress : Not at all  Social Connections: [place] (04/21/2022)   Social Connection and Isolation Panel [NHANES]    Frequency of Communication with Friends and Family: More than three times a week    Frequency of Social Gatherings with Friends and Family: More than three times a week    Attends Religious Services: 1 to 4 times per year    Active Member of [employer] or Organizations: No    Attends Archivist Meetings: 1 to 4 times per year    Marital Status: Married  Human resources officer Violence: Not At Risk (04/21/2022)   Humiliation, Afraid, Rape, and Kick questionnaire    Fear of Current or Ex-Partner: No    Emotionally Abused: No    Physically Abused: No    Sexually Abused: No    Review of Systems:  All other review of systems negative except as mentioned in the HPI.  Physical Exam: Vital signs BP (!) 129/58   Pulse 74   Temp (!) 97.5 F (36.4 C) (Temporal)   Ht '6\' 3"'$  (1.905  m)   Wt 201 lb (91.2 kg)   SpO2 100%   BMI 25.12 kg/m   General:   Alert,  Well-developed, well-nourished, pleasant and cooperative in NAD Lungs:  Clear throughout to auscultation.   Heart:   Regular rate and rhythm; no murmurs, clicks, rubs,  or gallops. Abdomen:  Soft, nontender and nondistended. Normal bowel sounds.   Neuro/Psych:  Alert and cooperative. Normal mood and affect. A and O x 3   Gatha Mayer, MD, [facility]

## 2022-06-17 ENCOUNTER — Ambulatory Visit (AMBULATORY_SURGERY_CENTER): Payer: Medicare HMO | Admitting: Internal Medicine

## 2022-06-17 ENCOUNTER — Encounter: Payer: Self-pay | Admitting: Internal Medicine

## 2022-06-17 VITALS — BP 106/57 | HR 77 | Temp 97.5°F | Resp 16 | Ht 75.0 in | Wt 201.0 lb

## 2022-06-17 DIAGNOSIS — Z09 Encounter for follow-up examination after completed treatment for conditions other than malignant neoplasm: Secondary | ICD-10-CM

## 2022-06-17 DIAGNOSIS — Z860100 Personal history of colon polyps, unspecified: Secondary | ICD-10-CM

## 2022-06-17 DIAGNOSIS — D120 Benign neoplasm of cecum: Secondary | ICD-10-CM | POA: Diagnosis not present

## 2022-06-17 DIAGNOSIS — D128 Benign neoplasm of rectum: Secondary | ICD-10-CM | POA: Diagnosis not present

## 2022-06-17 DIAGNOSIS — Z860101 Personal history of adenomatous and serrated colon polyps: Secondary | ICD-10-CM

## 2022-06-17 DIAGNOSIS — Z800 Family history of malignant neoplasm of digestive organs: Secondary | ICD-10-CM | POA: Diagnosis not present

## 2022-06-17 MED ORDER — SODIUM CHLORIDE 0.9 % IV SOLN: 500.0000 mL | Freq: Once | INTRAVENOUS | Status: DC

## 2022-06-17 NOTE — Patient Instructions (Addendum)
I found and removed 3 tiny polyps.  You also have a condition called diverticulosis - common and not usually a problem. Please read the handout provided.   I will let you know pathology results and when to have another routine colonoscopy by mail and/or My Chart.  Resume Eliquis tomorrow   I appreciate the opportunity to care for you. Gatha Mayer, MD, [facility]   Handout on polyps and diverticuosis given.  YOU HAD AN ENDOSCOPIC PROCEDURE TODAY AT [facility]:   Refer to the procedure report that was given to you for any specific questions about what was found during the examination.  If the procedure report does not answer your questions, please call your gastroenterologist to clarify.  If you requested that your care partner not be given the details of your procedure findings, then the procedure report has been included in a sealed envelope for you to review at your convenience later.  YOU SHOULD EXPECT: Some feelings of bloating in the abdomen. Passage of more gas than usual.  Walking can help get rid of the air that was put into your GI tract during the procedure and reduce the bloating. If you had a lower endoscopy (such as a colonoscopy or flexible sigmoidoscopy) you may notice spotting of blood in your stool or on the toilet paper. If you underwent a bowel prep for your procedure, you may not have a normal bowel movement for a few days.  Please Note:  You might notice some irritation and congestion in your nose or some drainage.  This is from the oxygen used during your procedure.  There is no need for concern and it should clear up in a day or so.  SYMPTOMS TO REPORT IMMEDIATELY:  Following lower endoscopy (colonoscopy or flexible sigmoidoscopy):  Excessive amounts of blood in the stool  Significant tenderness or worsening of abdominal pains  Swelling of the abdomen that is new, acute  Fever of 100F or higher  For urgent or emergent issues, a gastroenterologist  can be reached at any hour by calling [phone]. Do not use MyChart messaging for urgent concerns.    DIET:  We do recommend a small meal at first, but then you may proceed to your regular diet.  Drink plenty of fluids but you should avoid alcoholic beverages for 24 hours.  ACTIVITY:  You should plan to take it easy for the rest of today and you should NOT DRIVE or use heavy machinery until tomorrow (because of the sedation medicines used during the test).    FOLLOW UP: Our staff will call the number listed on your records the next business day following your procedure.  We will call around 7:15- 8:00 am to check on you and address any questions or concerns that you may have regarding the information given to you following your procedure. If we do not reach you, we will leave a message.  If you develop any symptoms (ie: fever, flu-like symptoms, shortness of breath, cough etc.) before then, please call [phone].  If you test positive for Covid 19 in the 2 weeks post procedure, please call and report this information to [place].    If any biopsies were taken you will be contacted by phone or by letter within the next 1-3 weeks.  Please call us at [phone] if you have not heard about the biopsies in 3 weeks.    SIGNATURES/CONFIDENTIALITY: You and/or your care partner have signed paperwork which will be entered into your  electronic medical record.  These signatures attest to the fact that that the information above on your After Visit Summary has been reviewed and is understood.  Full responsibility of the confidentiality of this discharge information lies with you and/or your care-partner.

## 2022-06-17 NOTE — Op Note (Addendum)
[place] Patient Name: Robert [name] Procedure Date: 06/17/2022 1:25 PM MRN: [id number] Endoscopist: Gatha Mayer , MD Age: 71 Referring MD:  Date of Birth: 1951-07-01 Gender: Male Account #: [id number] Procedure:                Colonoscopy Indications:              Surveillance: Personal history of adenomatous                            polyps on last colonoscopy 3 years ago Medicines:                Monitored Anesthesia Care Procedure:                Pre-Anesthesia Assessment:                           - Prior to the procedure, a History and Physical                            was performed, and patient medications and                            allergies were reviewed. The patient's tolerance of                            previous anesthesia was also reviewed. The risks                            and benefits of the procedure and the sedation                            options and risks were discussed with the patient.                            All questions were answered, and informed consent                            was obtained. Prior Anticoagulants: The patient                            last took Eliquis (apixaban) 2 days prior to the                            procedure. ASA Grade Assessment: II - A patient                            with mild systemic disease. After reviewing the                            risks and benefits, the patient was deemed in                            satisfactory condition to undergo the procedure.  After obtaining informed consent, the colonoscope                            was passed under direct vision. Throughout the                            procedure, the patient's blood pressure, pulse, and                            oxygen saturations were monitored continuously. The                            Olympus CF-HQ190L (Serial# [id number]) Colonoscope was                            introduced through the  anus and advanced to the the                            cecum, identified by appendiceal orifice and                            ileocecal valve. The colonoscopy was performed                            without difficulty. The patient tolerated the                            procedure well. The quality of the bowel                            preparation was good. The ileocecal valve,                            appendiceal orifice, and rectum were photographed.                            The bowel preparation used was Miralax via split                            dose instruction. Scope In: 1:35:50 PM Scope Out: 1:54:36 PM Scope Withdrawal Time: 0 hours 16 minutes 36 seconds  Total Procedure Duration: 0 hours 18 minutes 46 seconds  Findings:                 The perianal and digital rectal examinations were                            normal. Pertinent negatives include normal prostate                            (size, shape, and consistency).                           Three sessile polyps were found in the rectum and  cecum. The polyps were diminutive in size. These                            polyps were removed with a cold snare. Resection                            and retrieval were complete. Verification of                            patient identification for the specimen was done.                            Estimated blood loss was minimal.                           Multiple small and large-mouthed diverticula were                            found in the entire colon.                           The exam was otherwise without abnormality on                            direct and retroflexion views. Complications:            No immediate complications. Estimated Blood Loss:     Estimated blood loss was minimal. Impression:               - Three diminutive polyps in the rectum and in the                            cecum, removed with a cold snare. Resected and                             retrieved.                           - Diverticulosis in the entire examined colon.                           - The examination was otherwise normal on direct                            and retroflexion views.                           - Personal history of colonic polyps.                           2005 2 adenomas                           2010 2 adenomas                           08/26/2015  4 polyps max 15 mm 1-2 adenomas w/ max 15                            mm                           03/28/2019 3 polyps max 20 mm 2 adenomas and                            diminutive sigmoid hyperplastic Recommendation:           - Patient has a contact number available for                            emergencies. The signs and symptoms of potential                            delayed complications were discussed with the                            patient. Return to normal activities tomorrow.                            Written discharge instructions were provided to the                            patient.                           - Resume previous diet.                           - Continue present medications.                           - Repeat colonoscopy is recommended for                            surveillance. The colonoscopy date will be                            determined after pathology results from today's                            exam become available for review.                           - Resume Eliquis (apixaban) at prior dose tomorrow. Gatha Mayer, MD 06/17/2022 2:05:41 PM This report has been signed electronically.

## 2022-06-17 NOTE — Progress Notes (Signed)
Called to room to assist during endoscopic procedure.  Patient ID and intended procedure confirmed with present staff. Received instructions for my participation in the procedure from the performing physician.  

## 2022-06-17 NOTE — Progress Notes (Signed)
To pacu, vss. Report to [email or web address] 

## 2022-06-20 ENCOUNTER — Telehealth: Payer: Self-pay

## 2022-06-20 NOTE — Telephone Encounter (Signed)
Attempted to reach patient for post-procedure f/u call. No answer. Left message for him to please not hesitate to call us if he has any questions/concerns regarding his care. 

## 2022-06-20 NOTE — Telephone Encounter (Signed)
Patient called and states he is feeling good. No concerns.

## 2022-06-21 ENCOUNTER — Other Ambulatory Visit: Payer: Self-pay | Admitting: Internal Medicine

## 2022-06-21 DIAGNOSIS — I10 Essential (primary) hypertension: Secondary | ICD-10-CM

## 2022-06-24 ENCOUNTER — Encounter: Payer: Self-pay | Admitting: Internal Medicine

## 2022-06-29 DIAGNOSIS — M1611 Unilateral primary osteoarthritis, right hip: Secondary | ICD-10-CM | POA: Insufficient documentation

## 2022-07-04 DIAGNOSIS — M1611 Unilateral primary osteoarthritis, right hip: Secondary | ICD-10-CM | POA: Diagnosis not present

## 2022-07-05 ENCOUNTER — Telehealth: Payer: Self-pay | Admitting: Internal Medicine

## 2022-07-05 NOTE — Telephone Encounter (Signed)
PT visits today with a Preop form to be filled out for [facility] by Dr.Plotnikov. It is currently in [place]. Please notify PT when this is filled.  CB if needed: [phone]

## 2022-07-05 NOTE — Telephone Encounter (Signed)
Place in MD purple folder for completion.[name] KitchenJohny [name]

## 2022-07-06 ENCOUNTER — Telehealth: Payer: Self-pay | Admitting: *Deleted

## 2022-07-06 NOTE — Telephone Encounter (Signed)
   Pre-operative Risk Assessment    Patient Name: Robert [name]  DOB: Jun 01, 1951 MRN: [id number]      Request for Surgical Clearance    Procedure:   RIGHT TOTAL HIP ARTHROPLASTY  Date of Surgery:  Clearance TBD                                 Surgeon:  DR. Rod Can Surgeon's Group or Practice Name:  [facility] Phone number:  [phone] ATTN: [place] Fax number:  [phone]   Type of Clearance Requested:   - Medical  - Pharmacy:  Hold Apixaban (Eliquis)     Type of Anesthesia:  Spinal   Additional requests/questions:    Jiles Prows   07/06/2022, 3:56 PM

## 2022-07-07 NOTE — Telephone Encounter (Signed)
Form faxed to [facility] ortho.[name] KitchenJohny [name]

## 2022-07-07 NOTE — Telephone Encounter (Signed)
Okay.  Thanks.

## 2022-07-08 NOTE — Telephone Encounter (Signed)
Patient with diagnosis of afib on Eliquis for anticoagulation.    Procedure: RIGHT TOTAL HIP ARTHROPLASTY Date of procedure: TBD   CHA2DS2-VASc Score = 3   This indicates a 3.2% annual risk of stroke. The patient's score is based upon: CHF History: 0 HTN History: 1 Diabetes History: 1 Stroke History: 0 Vascular Disease History: 0 Age Score: 1 Gender Score: 0      CrCl >100 ml/min  Per office protocol, patient can hold Eliquis for 3 days prior to procedure.    **This guidance is not considered finalized until pre-operative APP has relayed final recommendations.**

## 2022-07-08 NOTE — Telephone Encounter (Signed)
Primary Cardiologist:Philip Nahser, MD   Preoperative team, please contact this patient and set up a phone call appointment for further preoperative risk assessment. Please obtain consent and complete medication review. Thank you for your help.   I confirm that guidance regarding antiplatelet and oral anticoagulation therapy has been completed and, if necessary, noted below.   Emmaline Life, NP-C  07/08/2022, 4:27 PM [address], [address] Office [phone] Fax [phone]

## 2022-07-11 ENCOUNTER — Telehealth: Payer: Self-pay | Admitting: *Deleted

## 2022-07-11 NOTE — Telephone Encounter (Signed)
Pt agreeable to plan of care for tele pre op appt 07/19/22 @ 10 am. Med rec and consent are done.      Patient Consent for Virtual Visit        Robert [name] has provided verbal consent on 07/11/2022 for a virtual visit (video or telephone).   CONSENT FOR VIRTUAL VISIT FOR:  Robert [name]  By participating in this virtual visit I agree to the following:  I hereby voluntarily request, consent and authorize [facility] and its employed or contracted physicians, physician assistants, nurse practitioners or other licensed health care professionals (the Practitioner), to provide me with telemedicine health care services (the "Services") as deemed necessary by the treating Practitioner. I acknowledge and consent to receive the Services by the Practitioner via telemedicine. I understand that the telemedicine visit will involve communicating with the Practitioner through live audiovisual communication technology and the disclosure of certain medical information by electronic transmission. I acknowledge that I have been given the opportunity to request an in-person assessment or other available alternative prior to the telemedicine visit and am voluntarily participating in the telemedicine visit.  I understand that I have the right to withhold or withdraw my consent to the use of telemedicine in the course of my care at any time, without affecting my right to future care or treatment, and that the Practitioner or I may terminate the telemedicine visit at any time. I understand that I have the right to inspect all information obtained and/or recorded in the course of the telemedicine visit and may receive copies of available information for a reasonable fee.  I understand that some of the potential risks of receiving the Services via telemedicine include:  Delay or interruption in medical evaluation due to technological equipment failure or disruption; Information transmitted may not  be sufficient (e.g. poor resolution of images) to allow for appropriate medical decision making by the Practitioner; and/or  In rare instances, security protocols could fail, causing a breach of personal health information.  Furthermore, I acknowledge that it is my responsibility to provide information about my medical history, conditions and care that is complete and accurate to the best of my ability. I acknowledge that Practitioner's advice, recommendations, and/or decision may be based on factors not within their control, such as incomplete or inaccurate data provided by me or distortions of diagnostic images or specimens that may result from electronic transmissions. I understand that the practice of medicine is not an exact science and that Practitioner makes no warranties or guarantees regarding treatment outcomes. I acknowledge that a copy of this consent can be made available to me via my patient portal ([place]), or I can request a printed copy by calling the office of [facility].    I understand that my insurance will be billed for this visit.   I have read or had this consent read to me. I understand the contents of this consent, which adequately explains the benefits and risks of the Services being provided via telemedicine.  I have been provided ample opportunity to ask questions regarding this consent and the Services and have had my questions answered to my satisfaction. I give my informed consent for the services to be provided through the use of telemedicine in my medical care

## 2022-07-11 NOTE — Telephone Encounter (Signed)
Pt agreeable to plan of care for tele pre op appt 07/19/22 @ 10 am. Med rec and consent are done.      Patient Consent for Virtual Visit        Robert [name] has provided verbal consent on 07/11/2022 for a virtual visit (video or telephone).   CONSENT FOR VIRTUAL VISIT FOR:  Robert [name]  By participating in this virtual visit I agree to the following:  I hereby voluntarily request, consent and authorize [facility] and its employed or contracted physicians, physician assistants, nurse practitioners or other licensed health care professionals (the Practitioner), to provide me with telemedicine health care services (the "Services") as deemed necessary by the treating Practitioner. I acknowledge and consent to receive the Services by the Practitioner via telemedicine. I understand that the telemedicine visit will involve communicating with the Practitioner through live audiovisual communication technology and the disclosure of certain medical information by electronic transmission. I acknowledge that I have been given the opportunity to request an in-person assessment or other available alternative prior to the telemedicine visit and am voluntarily participating in the telemedicine visit.  I understand that I have the right to withhold or withdraw my consent to the use of telemedicine in the course of my care at any time, without affecting my right to future care or treatment, and that the Practitioner or I may terminate the telemedicine visit at any time. I understand that I have the right to inspect all information obtained and/or recorded in the course of the telemedicine visit and may receive copies of available information for a reasonable fee.  I understand that some of the potential risks of receiving the Services via telemedicine include:  Delay or interruption in medical evaluation due to technological equipment failure or disruption; Information transmitted may not  be sufficient (e.g. poor resolution of images) to allow for appropriate medical decision making by the Practitioner; and/or  In rare instances, security protocols could fail, causing a breach of personal health information.  Furthermore, I acknowledge that it is my responsibility to provide information about my medical history, conditions and care that is complete and accurate to the best of my ability. I acknowledge that Practitioner's advice, recommendations, and/or decision may be based on factors not within their control, such as incomplete or inaccurate data provided by me or distortions of diagnostic images or specimens that may result from electronic transmissions. I understand that the practice of medicine is not an exact science and that Practitioner makes no warranties or guarantees regarding treatment outcomes. I acknowledge that a copy of this consent can be made available to me via my patient portal ([place]), or I can request a printed copy by calling the office of [facility].    I understand that my insurance will be billed for this visit.   I have read or had this consent read to me. I understand the contents of this consent, which adequately explains the benefits and risks of the Services being provided via telemedicine.  I have been provided ample opportunity to ask questions regarding this consent and the Services and have had my questions answered to my satisfaction. I give my informed consent for the services to be provided through the use of telemedicine in my medical care

## 2022-07-18 ENCOUNTER — Other Ambulatory Visit: Payer: Self-pay | Admitting: Cardiovascular Disease

## 2022-07-19 ENCOUNTER — Ambulatory Visit (INDEPENDENT_AMBULATORY_CARE_PROVIDER_SITE_OTHER): Payer: Medicare HMO | Admitting: Family

## 2022-07-19 ENCOUNTER — Encounter (HOSPITAL_BASED_OUTPATIENT_CLINIC_OR_DEPARTMENT_OTHER): Payer: Self-pay | Admitting: Family

## 2022-07-19 DIAGNOSIS — Z01818 Encounter for other preprocedural examination: Secondary | ICD-10-CM

## 2022-07-19 NOTE — Progress Notes (Signed)
Virtual Visit via Telephone Note   Because of Robert [name] [name]'s co-morbid illnesses, he is at least at moderate risk for complications without adequate follow up.  This format is felt to be most appropriate for this patient at this time.  The patient did not have access to video technology/had technical difficulties with video requiring transitioning to audio format only (telephone).  All issues noted in this document were discussed and addressed.  No physical exam could be performed with this format.  Please refer to the patient's chart for his consent to telehealth for [facility].    Date:  07/19/2022   ID:  Robert [name], DOB 11/14/1950, MRN [id number] The patient was identified using 2 identifiers.  Patient Location: Home Provider Location: Home Office   PCP:  Plotnikov, Evie Lacks, MD   [place] Cardiologist:  Mertie Moores, MD     Evaluation Performed:  Follow-Up Visit  Chief Complaint:  Preop clearance  History of Present Illness:    Robert [name] is a 71 y.o. male with paroxysmal atrial fibrillation on Eliquis, HTN, HLD, DM2, bladder neoplasm.  While seen via virtual visit 02/25/2022 and clearance granted for resection of bladder tumor with bilateral leg arthroscopy.  Presents today for follow-up. Upcoming right total hip arthroplasty.  Tells me date is to be determined based on upcoming visit 07/27/22 with his urology team after completing treatments for bladder cancer.  He stays active around his home currently taking for his wife with dementia exercising.  Notes his episodes of irregular heartbeat are more frequent occurring 2-4 times per week rather than a month.  They are relieved by propanolol as needed.  We discussed possible transition from 25 mg to 37.5 mg metoprolol but he feels symptoms are overall not bothersome at this time and prefers to avoid medication changes.  Reports no shortness of breath nor dyspnea on  exertion. Reports no chest pain, pressure, or tightness. No edema, orthopnea, PND.   Past Medical History:  Diagnosis Date   Anticoagulant long-term use    eliquis--- managed by cardiology   Bladder cancer [facility])    urologist--- dr Junious Silk;  overlapping   History of basal cell carcinoma ([place]) excision    History of COVID-19 05/2019   per pt mild symptoms that resolved   HLD (hyperlipidemia)    HTN (hypertension)    Hx of colonic polyps    Insulin dependent type 2 diabetes mellitus [facility])    endocrinologist--- [name] --  (03-11-2022 per pt check blood sugar 1-2 times daily,  fasting sugar-- 118-120s)   Nocturia    Paroxysmal atrial fibrillation ([place]) 03/12/2019   cardiologist--- [name]   Ureterocele    Past Surgical History:  Procedure Laterality Date   BLEPHAROPLASTY Bilateral    per pt approx  2001;   upper eyelid's   COLONOSCOPY     last one approx 2020   PATELLAR TENDON REPAIR Left 04/16/2004   '@[facility]'$    TRANSURETHRAL RESECTION OF BLADDER TUMOR N/A 06/18/2021   Procedure: TRANSURETHRAL RESECTION OF BLADDER TUMOR (TURBT) WITH RIGHT URETERAL STENT PLACEMENT, RIGHT URETEROSCOPY WITH DISTRACTION OF TUMOR, FULGURATION;  Surgeon: Festus Aloe, MD;  Location: [place];  Service: Urology;  Laterality: N/A;   TRANSURETHRAL RESECTION OF BLADDER TUMOR N/A 03/15/2022   Procedure: TRANSURETHRAL RESECTION OF BLADDER TUMOR (TURBT) WITH BILATERAL URETEROSCOPY/URETHRAL/ DILATION/BILATERAL RETROGRADE PYELOGRAM/BIOPSY AND FULGURATION OF RIGHT URETERAL CANCER/RIGHT STENT PLACEMENT;  Surgeon: Festus Aloe, MD;  Location: [facility];  Service: Urology;  Laterality: N/A;     No outpatient medications have been marked as taking for the 07/19/22 encounter (Appointment) with [facility] PRE OP CLEARANCE APP.     Allergies:   Patient has no known allergies.   Social History   Tobacco Use   Smoking status: Some Days    Types: Cigars    Last attempt to quit:  02/22/2011    Years since quitting: 11.4   Smokeless tobacco: Never  Vaping Use   Vaping Use: Never used  Substance Use Topics   Alcohol use: Yes    Alcohol/week: 0.0 standard drinks of alcohol    Comment: 6-8oz /wk   Drug use: Never     Family Hx: The patient's family history includes Aortic aneurysm in an other family member; Cancer in an other family member; Pancreatic cancer (age of onset: 38) in his mother. There is no history of Colon cancer, Rectal cancer, Stomach cancer, or Esophageal cancer.  ROS:   Please see the history of present illness.     All other systems reviewed and are negative.   Prior CV studies:   The following studies were reviewed today:  None  Labs/Other Tests and Data Reviewed:    EKG:  No ECG reviewed.  Recent Labs: 03/15/2022: BUN 13; Creatinine, Ser 0.60; Hemoglobin 17.0; Potassium 4.0; Sodium 138   Recent Lipid Panel Lab Results  Component Value Date/Time   CHOL 100 04/20/2021 11:02 AM   TRIG 119.0 04/20/2021 11:02 AM   HDL 41.40 04/20/2021 11:02 AM   CHOLHDL 2 04/20/2021 11:02 AM   LDLCALC 35 04/20/2021 11:02 AM   LDLDIRECT 77.0 07/07/2015 09:30 AM    Wt Readings from Last 3 Encounters:  06/17/22 201 lb (91.2 kg)  04/28/22 201 lb (91.2 kg)  03/28/22 209 lb 9.6 oz (95.1 kg)     Risk Assessment/Calculations:    CHA2DS2-VASc Score = 3   This indicates a 3.2% annual risk of stroke. The patient's score is based upon: CHF History: 0 HTN History: 1 Diabetes History: 1 Stroke History: 0 Vascular Disease History: 0 Age Score: 1 Gender Score: 0         Objective:    Vital Signs:  No vitals available  Given telephonic nature of communication, physical exam is limited: Alert and oriented x3. No acute distress. Normal affect. Speech and respirations are unlabored.   ASSESSMENT & PLAN:    Preop -pending hip surgery.According to the Revised Cardiac Risk Index (RCRI), his Perioperative Risk of Major Cardiac Event is (%): 0.9.  His Functional Capacity in METs is: 5.62 according to the Duke Activity Status Index (DASI).  He is deemed acceptable risk for the planned procedure without additional cardiovascular testing.  Per pharmacy team and protocols may hold Eliquis 3 days prior to procedure.  Will route to surgical team so they are aware via epic fax function.  PAF / Hypercoagulable state -Notes episodes of irregular heartbeat to the 4 times per week relieved very promptly by as needed propanolol.  Discussed possible increase metoprolol to 37.5 mg instead of 25 mg but he prefers to maintain current dosing and continue to monitor symptoms.  Denies bleeding complications on Eliquis.      Time:   Today, I have spent 7 minutes with the patient with telehealth technology discussing the above problems.     Medication Adjustments/Labs and Tests Ordered: Current medicines are reviewed at length with the patient today.  Concerns regarding medicines are outlined above.  Tests Ordered: No orders of the defined types were placed in this encounter.   Medication Changes: No orders of the defined types were placed in this encounter.   Follow Up:  In Person  November 2023 for annual office visit  Signed, Loel Dubonnet, NP  07/19/2022 10:00 AM    [place]

## 2022-07-27 DIAGNOSIS — C678 Malignant neoplasm of overlapping sites of bladder: Secondary | ICD-10-CM | POA: Diagnosis not present

## 2022-07-27 DIAGNOSIS — R8271 Bacteriuria: Secondary | ICD-10-CM | POA: Diagnosis not present

## 2022-08-01 ENCOUNTER — Ambulatory Visit: Payer: Medicare HMO | Admitting: Internal Medicine

## 2022-08-01 ENCOUNTER — Encounter: Payer: Self-pay | Admitting: Internal Medicine

## 2022-08-01 VITALS — BP 128/80 | HR 75 | Ht 75.0 in | Wt 207.8 lb

## 2022-08-01 DIAGNOSIS — E663 Overweight: Secondary | ICD-10-CM

## 2022-08-01 DIAGNOSIS — E785 Hyperlipidemia, unspecified: Secondary | ICD-10-CM | POA: Diagnosis not present

## 2022-08-01 DIAGNOSIS — E119 Type 2 diabetes mellitus without complications: Secondary | ICD-10-CM

## 2022-08-01 LAB — POCT GLYCOSYLATED HEMOGLOBIN (HGB A1C): Hemoglobin A1C: 6.5 % — AB (ref 4.0–5.6)

## 2022-08-01 NOTE — Progress Notes (Signed)
Patient ID: Robert [name], male   DOB: [birth year], 71 y.o.   MRN: [id number]   HPI: Robert [name] is a 71 y.o.-year-old male, returning for follow-up for  DM2, dx in 2000s, insulin-independent since 2018, uncontrolled, with complications (PN, ED).  Last visit 4 months ago.  Interim history: Last year, he was diagnosed with bladder cancer and had transurethral resection on 06/18/2021.  The cancer was high-grade, but noninvasive. He is getting BCG bladder instillations - off and on Eliquis.  Before last OV, he had a cancer recurrence >> no need for ChTx but had to have BCG instillations. No increased urination, blurry vision, nausea, chest pain. Has hip pain (OA) in the hip - he prefers to stand.  He will need to have a hip replacement.  Reviewed HbA1c levels: Lab Results  Component Value Date   HGBA1C 6.7 (A) 03/28/2022   HGBA1C 7.6 (A) 12/06/2021   HGBA1C 6.7 (A) 08/03/2021   Pt is on a regimen of: - Trulicity 1.5 >> 3 mg weekly. - Metformin 1000 mg 2x a day >> 2000 mg with dinner - Glipizide 2.5 mg before a large dinner >> Glipizide ER 2.5 mg before breakfast and a crushed tablet before a large meal We stopped Novolog 70/30 10 units 2x a day in 11/2017, when starting Trulicity.  Pt checks his sugars 1-2 times a day: - am:   108-144, 160, 169 >> 106-147, 155, 167 >> 119-130s, 150, 160 - 2h after b'fast:119-147, 157 > 126-155, 182 >> 136-171 >> ? - before lunch: 115, 133 >> 135-142 >> 140-173, 239 >> ? - 2h after lunch: 144-212, 237 >> 124-168 >> 160-179, 282 - before dinner: 152 >> 120-143 >> 128, 132-159, 207 >> ? - 2h after dinner:  102-149, 177 >> 138-201 >> 121-162, 188 >> n/c - bedtime:  110-135 >> 113-179 >> 121-138 >> 99-150s - nighttime: 129 >> 91 (wine) >> 118 >> n/c Lowest sugar was 28 right after starting insulin >> .[name] Kitchen.106 >> 119; he has hypoglycemia awareness in the 70s. Highest sugar was 237 >> 139 >> 280s - sandwich + icecream.  Glucometer: ReliOn  Pt's  meals are: - Breakfast: whole wheat toast + butte - Lunch: tuna salad or tuna sandwich ([employer] delivery), soft taco, meat + baked beans or potatoes - Dinner: [employer] delivery - Snacks: 1-0: nuts, bananas, other fruit He decreased his alcohol intake. He was swimming 5 times a week before the coronavirus pandemic, now exercising at home.  -No CKD; last BUN/creatinine:  Lab Results  Component Value Date   BUN 13 03/15/2022   BUN 14 06/18/2021   CREATININE 0.60 (L) 03/15/2022   CREATININE 0.70 06/18/2021  On lisinopril 40.  -+ HL; last set of lipids: Lab Results  Component Value Date   CHOL 100 04/20/2021   HDL 41.40 04/20/2021   LDLCALC 35 04/20/2021   LDLDIRECT 77.0 07/07/2015   TRIG 119.0 04/20/2021   CHOLHDL 2 04/20/2021  On Lipitor 40 and co-Q10.  - last eye exam was in 12/2021: No DR, + cataract.   -he has numbness and tingling in his feet.  Last foot exam 03/28/2022.  He has a h/o arrhythmia (A fib) - wore an event monitor >> now on beta blocker. Cardiologist: Dr. Acie Fredrickson.  ROS: + See HPI Neurological: no tremors/+ numbness/+ tingling/no dizziness  I reviewed pt's medications, allergies, PMH, social hx, family hx, and changes were documented in the history of present illness. Otherwise, unchanged from my initial  visit note.  Past Medical History:  Diagnosis Date   Anticoagulant long-term use    eliquis--- managed by cardiology   Bladder cancer [facility])    urologist--- dr Junious Silk;  overlapping   History of basal cell carcinoma ([place]) excision    History of COVID-19 05/2019   per pt mild symptoms that resolved   HLD (hyperlipidemia)    HTN (hypertension)    Hx of colonic polyps    Insulin dependent type 2 diabetes mellitus [facility])    endocrinologist--- [name] --  (03-11-2022 per pt check blood sugar 1-2 times daily,  fasting sugar-- 118-120s)   Nocturia    Paroxysmal atrial fibrillation ([place]) 03/12/2019   cardiologist--- [name]   Ureterocele    Past  Surgical History:  Procedure Laterality Date   BLEPHAROPLASTY Bilateral    per pt approx  2001;   upper eyelid's   COLONOSCOPY     last one approx 2020   PATELLAR TENDON REPAIR Left 04/16/2004   '@[facility]'$    TRANSURETHRAL RESECTION OF BLADDER TUMOR N/A 06/18/2021   Procedure: TRANSURETHRAL RESECTION OF BLADDER TUMOR (TURBT) WITH RIGHT URETERAL STENT PLACEMENT, RIGHT URETEROSCOPY WITH DISTRACTION OF TUMOR, FULGURATION;  Surgeon: Festus Aloe, MD;  Location: [place];  Service: Urology;  Laterality: N/A;   TRANSURETHRAL RESECTION OF BLADDER TUMOR N/A 03/15/2022   Procedure: TRANSURETHRAL RESECTION OF BLADDER TUMOR (TURBT) WITH BILATERAL URETEROSCOPY/URETHRAL/ DILATION/BILATERAL RETROGRADE PYELOGRAM/BIOPSY AND FULGURATION OF RIGHT URETERAL CANCER/RIGHT STENT PLACEMENT;  Surgeon: Festus Aloe, MD;  Location: [facility];  Service: Urology;  Laterality: N/A;   Social History   Socioeconomic History   Marital status: Married    Spouse name: Not on file   Number of children: 2  Social Needs  Occupational History   Occupation: [employer]   Occupation: Cabin crew: Self Employed, retired  Tobacco Use   Smoking status: Former Smoker    Types: Cigars    Last attempt to quit: 02/22/2011   Smokeless tobacco: Never Used   Tobacco comment: 2 cigars monthly or less  Substance and Sexual Activity   Alcohol use: Yes     0.0 oz    Vodka, 2-3 drinks 3x a week   Drugs: No   Current Outpatient Medications on File Prior to Visit  Medication Sig Dispense Refill   acetaminophen (TYLENOL) 500 MG tablet Take 1,000 mg by mouth every 6 (six) hours as needed.     atorvastatin (LIPITOR) 40 MG tablet TAKE ONE TABLET BY MOUTH ONCE DAILY (Patient taking differently: Take 40 mg by mouth at bedtime.) 90 tablet 1   b complex vitamins capsule Take 1 capsule by mouth daily.     Dulaglutide (TRULICITY) 3 EV/0.3JK SOPN Inject 3 mg into the skin once a week. (Patient taking  differently: Inject 3 mg into the skin once a week. Tuesday's) 6 mL 3   ELIQUIS 5 MG TABS tablet TAKE ONE TABLET BY MOUTH TWICE DAILY 60 tablet 5   glipiZIDE (GLUCOTROL XL) 2.5 MG 24 hr tablet Take 1 tablet (2.5 mg total) by mouth 2 (two) times daily before a meal. (Patient taking differently: Take 2.5 mg by mouth 2 (two) times daily before a meal. Patient takes as need) 180 tablet 3   glucose blood test strip 1 each by Other route daily. Use daily for glucose control - Dx 250.00 100 each 0   hydrochlorothiazide (MICROZIDE) 12.5 MG capsule TAKE ONE CAPSULE BY MOUTH ONCE DAILY (Patient taking differently: Take 12.5 mg  by mouth daily.) 90 capsule 1   lisinopril (ZESTRIL) 40 MG tablet TAKE 1 AND 1/2 TABLETS BY MOUTH ONCE DAILY (Patient taking differently: 40 mg daily. TAKE 1 AND 1/2 TABLETS BY MOUTH ONCE DAILY) 135 tablet 0   metFORMIN (GLUCOPHAGE) 1000 MG tablet Take 1 tablet (1,000 mg total) by mouth 2 (two) times daily with a meal. 180 tablet 3   metoprolol succinate (TOPROL-XL) 25 MG 24 hr tablet TAKE ONE TABLET BY MOUTH ONCE DAILY (Patient taking differently: 25 mg at bedtime.) 90 tablet 3   Multiple Vitamin (MULTIVITAMIN) tablet Take 1 tablet by mouth daily.     propranolol (INDERAL) 10 MG tablet TAKE 1 TABLET BY MOUTH 4 TIMES DAILY AS NEEDED FOR PALPITATIONS, IRREGULAR OR FAST HEART RATE 90 tablet 3   sildenafil (REVATIO) 20 MG tablet TAKE 1 TO 2 TABLETS 2 TO 3 HOURS BEFORE SEX (Patient taking differently: as needed. TAKE 1 TO 2 TABLETS 2 TO 3 HOURS BEFORE SEX) 30 tablet 5   No current facility-administered medications on file prior to visit.   No Known Allergies Family History  Problem Relation Age of Onset   Aortic aneurysm Other        family history   Cancer Other        Breast (1st degree relative)   Pancreatic cancer Mother 93   Colon cancer Neg Hx    Rectal cancer Neg Hx    Stomach cancer Neg Hx    Esophageal cancer Neg Hx     PE: BP 128/80 (BP Location: Right Arm, Patient  Position: Sitting, Cuff Size: Normal)   Pulse 75   Ht '6\' 3"'$  (1.905 m)   Wt 207 lb 12.8 oz (94.3 kg)   SpO2 99%   BMI 25.97 kg/m  Wt Readings from Last 3 Encounters:  08/01/22 207 lb 12.8 oz (94.3 kg)  06/17/22 201 lb (91.2 kg)  04/28/22 201 lb (91.2 kg)   Constitutional: overweight, in NAD Eyes:  EOMI, no exophthalmos ENT: no neck masses, no cervical lymphadenopathy Cardiovascular: RRR, No MRG Respiratory: CTA B Musculoskeletal: no deformities Skin:no rashes Neurological: no tremor with outstretched hands  ASSESSMENT: 1. DM2, insulin-independent, uncontrolled, with complications - PN - ED  2. HL  3.  Overweight  PLAN:  1. Patient with longstanding previously uncontrolled type 2 diabetes, on oral antidiabetic regimen with metformin, sulfonylurea, and weekly GLP-1 receptor agonist, with good control lately. -At today's visit, just this at last visit, he only has high blood sugars after dietary indiscretions.  We discussed at last visit about taking a crush glipizide tablet before these but he sometimes forgets to do so.  Since last visit, he ate a sandwich with white bread and then had an ice cream and sugars increased well into the 200s.  We discussed that this is a meal before which he may need to take glipizide.  However, otherwise, no changes are needed to his regimen. - I suggested to:  Patient Instructions  Please continue: - Metformin 2000 mg with dinner - Glipizide ER 2.5 mg before breakfast and a crushed tablet before a large meal - Trulicity 3 mg weekly  Please return in 4 months with your sugar log.   - we checked his HbA1c: 6.5% (lower) - advised to check sugars at different times of the day - 1x a day, rotating check times - advised for yearly eye exams >> he is UTD - return to clinic in 4 months  2.  HL -Reviewed latest lipid panel from  03/2021: All fractions at goal: Lab Results  Component Value Date   CHOL 100 04/20/2021   HDL 41.40 04/20/2021    LDLCALC 35 04/20/2021   LDLDIRECT 77.0 07/07/2015   TRIG 119.0 04/20/2021   CHOLHDL 2 04/20/2021  -He is on Lipitor 40 mg daily and coq.10-without side effects -He is due for another lipid panel - at next OV  3.  Overweight -She lost approximately 26 pounds immediately after starting the GLP-1 receptor agonist and after switching to a more plant-based diet -Before last visit, he gained 6 pounds.  He is net -1 lbs since then.  [name], MD PhD [facility]

## 2022-08-01 NOTE — Patient Instructions (Addendum)
Please continue: - Metformin 1000 mg 2x a day - Glipizide ER 2.5 mg before breakfast and a crushed tablet before a large meal - Trulicity 3 mg weekly  Please return in 4-6 months with your sugar log.

## 2022-08-02 IMAGING — DX DG HIP (WITH OR WITHOUT PELVIS) 2-3V*R*
3 series · 3 of 3 positions shown · non-contrast
Comparison: 10/28/2019

CLINICAL DATA: Right hip pain for 3 months

EXAM:
DG HIP (WITH OR WITHOUT PELVIS) 2-3V RIGHT

[pelvis ap]
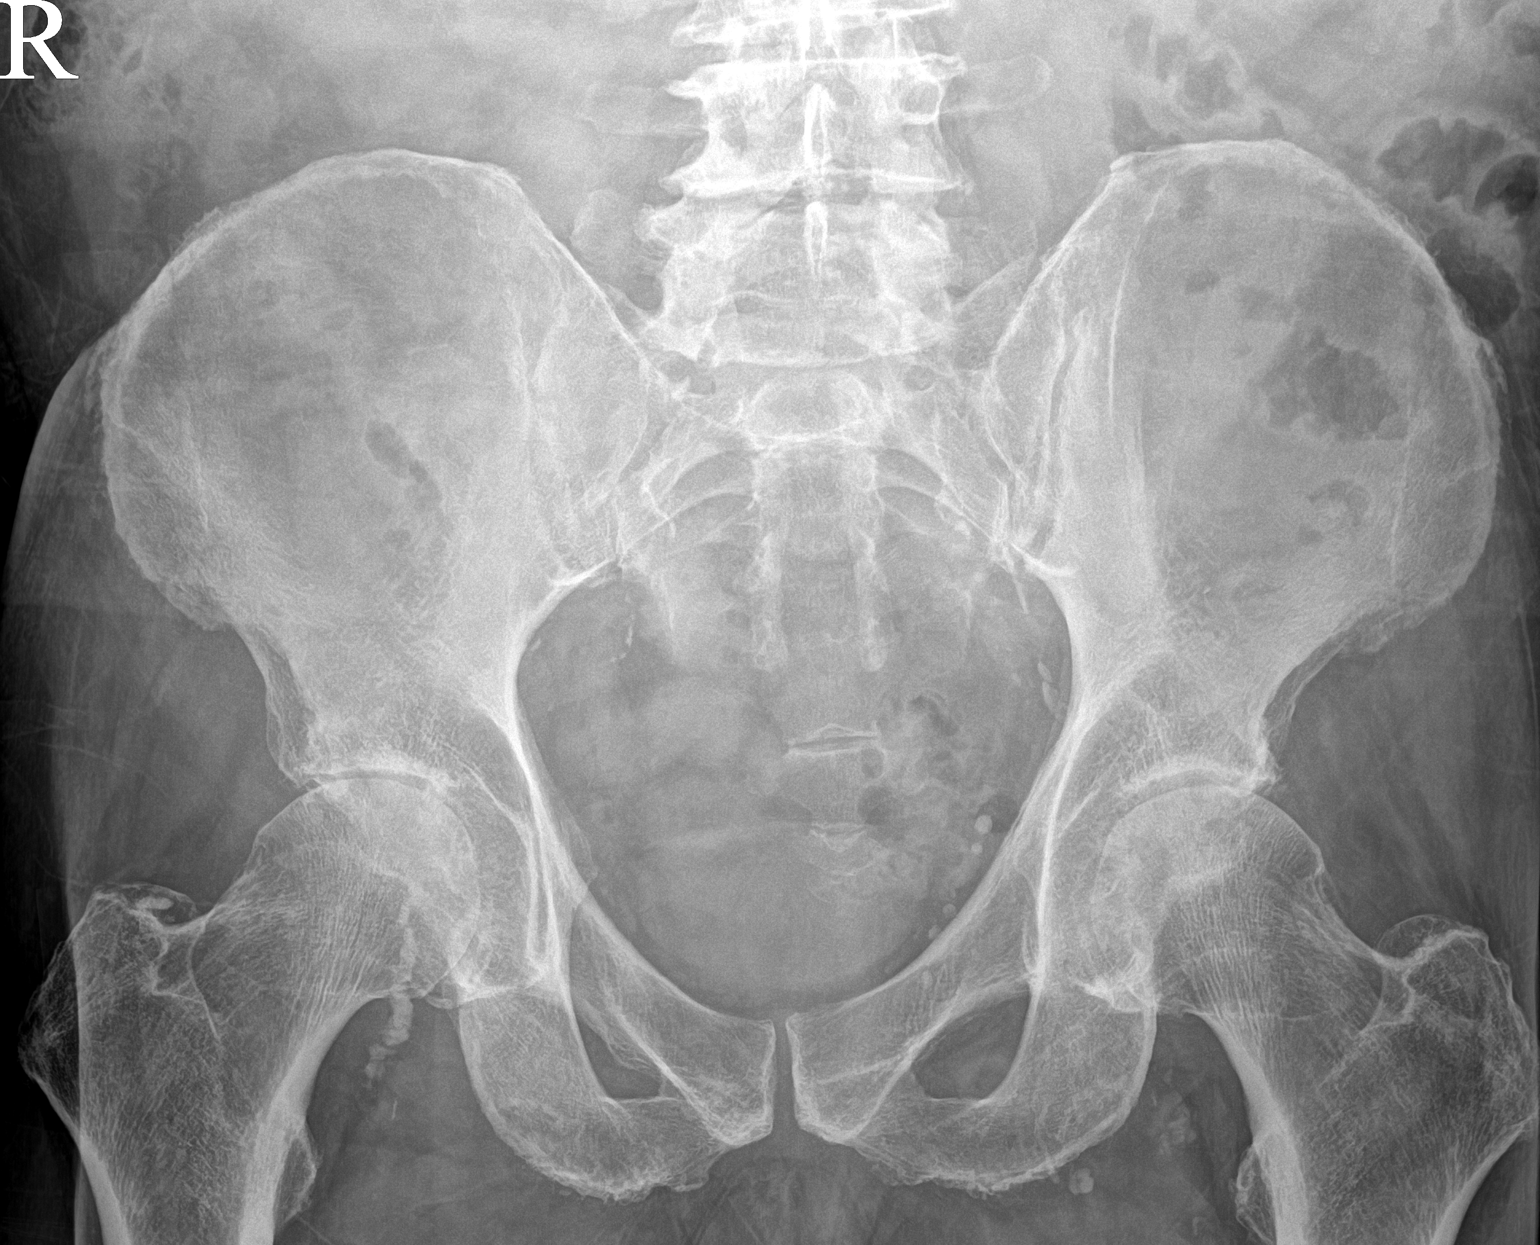

[hip ap]
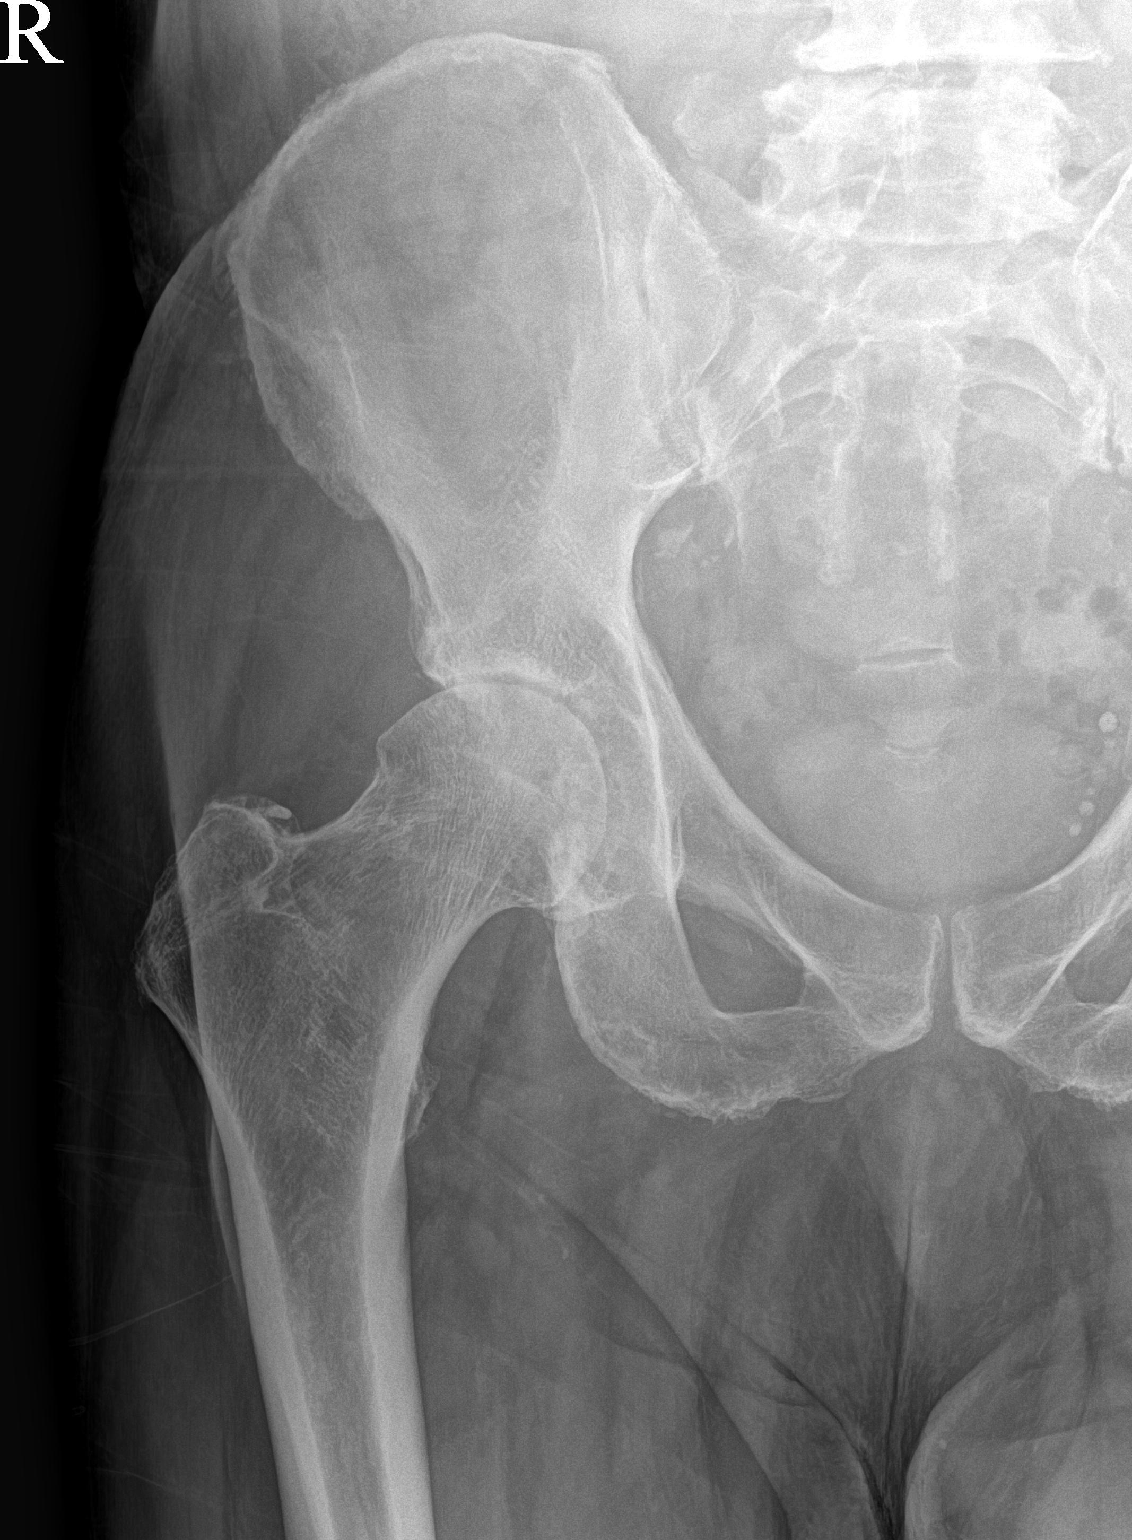

[hip frog leg]
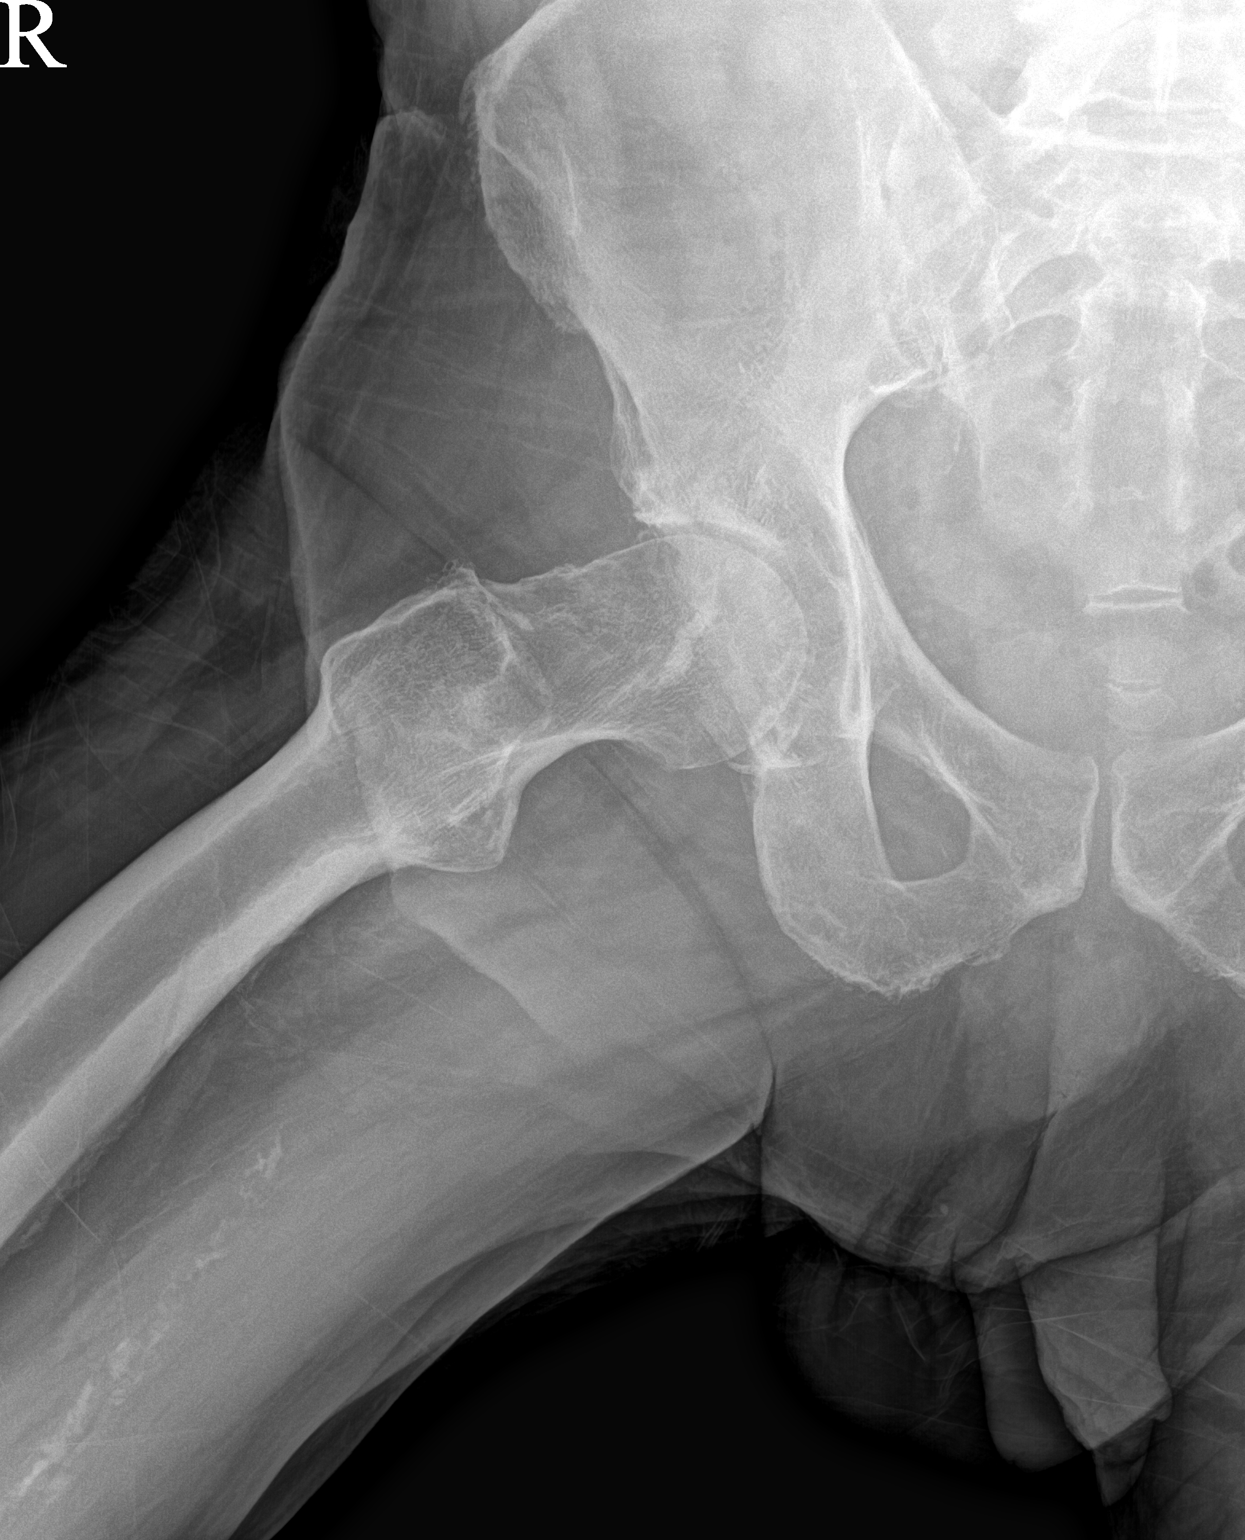

[3 of 3 positions shown; findings below may reference images not displayed]

FINDINGS: Frontal view of the pelvis as well as frontal and frogleg lateral
views of the right hip are obtained. No fracture, subluxation, or
dislocation within either hip. Progressive bilateral hip
osteoarthritis, right greater than left. Sacroiliac joints are
unremarkable. Stable lower lumbar spondylosis.
IMPRESSION: 1. Progressive bilateral hip osteoarthritis, right greater than
left.
2. No acute bony abnormalities.
3. Stable lower lumbar spondylosis.

## 2022-08-08 ENCOUNTER — Other Ambulatory Visit: Payer: Self-pay | Admitting: Internal Medicine

## 2022-08-08 DIAGNOSIS — E785 Hyperlipidemia, unspecified: Secondary | ICD-10-CM

## 2022-08-10 NOTE — Telephone Encounter (Signed)
Kerri from [facility] states that they have not received the pre-op form, Please re-fax the form if we still have it. I've asked them to fax it to [place] in case we don't.   [facility] Fax: [phone]  Kerri (P) : [phone]

## 2022-08-10 NOTE — Telephone Encounter (Signed)
Per chart form was faxedto office. Will faxed again at [phone].[name] KitchenJohny Chess

## 2022-08-24 ENCOUNTER — Telehealth: Payer: Medicare HMO

## 2022-08-30 ENCOUNTER — Encounter: Payer: Self-pay | Admitting: Cardiovascular Disease

## 2022-08-30 ENCOUNTER — Ambulatory Visit: Payer: Medicare HMO | Attending: Cardiovascular Disease | Admitting: Cardiovascular Disease

## 2022-08-30 VITALS — BP 128/74 | HR 86 | Ht 75.0 in | Wt 203.8 lb

## 2022-08-30 DIAGNOSIS — I480 Paroxysmal atrial fibrillation: Secondary | ICD-10-CM | POA: Diagnosis not present

## 2022-08-30 DIAGNOSIS — I10 Essential (primary) hypertension: Secondary | ICD-10-CM

## 2022-08-30 MED ORDER — LISINOPRIL 40 MG PO TABS: 40.0000 mg | ORAL_TABLET | Freq: Every day | ORAL | 3 refills | Status: DC

## 2022-08-30 NOTE — Patient Instructions (Addendum)
Medication Instructions:  DECREASE Lisinopril to '40mg'$  daily *If you need a refill on your cardiac medications before your next appointment, please call your pharmacy*   Lab Work: NONE If you have labs (blood work) drawn today and your tests are completely normal, you will receive your results only by: [place] (if you have MyChart) OR A paper copy in the mail If you have any lab test that is abnormal or we need to change your treatment, we will call you to review the results.   Testing/Procedures: NONE   Follow-Up: At [facility], you and your health needs are our priority.  As part of our continuing mission to provide you with exceptional heart care, we have created designated Provider Care Teams.  These Care Teams include your primary Cardiologist (physician) and Advanced Practice Providers (APPs -  Physician Assistants and Nurse Practitioners) who all work together to provide you with the care you need, when you need it.  We recommend signing up for the patient portal called "MyChart".  Sign up information is provided on this After Visit Summary.  MyChart is used to connect with patients for Virtual Visits (Telemedicine).  Patients are able to view lab/test results, encounter notes, upcoming appointments, etc.  Non-urgent messages can be sent to your provider as well.   To learn more about what you can do with MyChart, go to [email or web address].    Your next appointment:   1 year(s)  The format for your next appointment:   In Person  Provider:   Mertie Moores, MD       Important Information About Sugar

## 2022-08-30 NOTE — Progress Notes (Signed)
Cardiology Office Note:    Date:  08/30/2022   ID:  Robert [name], DOB 04/13/1951, MRN [id number]  PCP:  Cassandria Anger, MD  Cardiologist:  Mertie Moores, MD   Referring MD: Cassandria Anger, MD   Problem list 1.  Atrial fibrillation 2.  Hyperlipidemia 3.  Diabetes mellitus  Chief Complaint  Patient presents with   Atrial Fibrillation     11/06/17    Robert [name] is a 71 y.o. male with a hx of palpitations for the past several months.  They started the Monday before Thanksgiving. In the middle of the night to go the bathroom.  Felt some pounding in his ears. Counted his heart rate in the 140s.  Also verified his Hr  with his blood pressure cuff. The arrhythmia had resolved by the next morning. He has not had any dizziness/syncope or presyncope.  He exercises regularly.  He swims at the gym 5 days a week.  He is swimming speech seems to be about the same as before he was diagnosed with atrial fibrillation.  He had another episode 2 days later.  He went to the emergency room down in [facility] but was back in normal rhythm at that time.   He returned and saw his primary medical doctor and an event monitor was placed.  We found atrial fibrillation with a slightly rapid ventricular response.  He was called into the office for further evaluation and management.  Episodes seem to be at night Dr. Sherren Mocha started Atenolol   Is retiured now,  [facility] .    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  Claudication-like symptoms.  Has seen Dr. Oneida Alar  at [facility] and his arterial flow is found to be near normal in both legs. . He has not been diagnosed with sleep apnea but his wife states that he snores.  April 19, 2018:   Robert [name]   is seen back today for follow-up of his paroxysmal atrial fib, hyperlipidemia, hypertension, diabetes mellitus.  He is basically asymptomatic.  He cannot tell any difference between when he is in atrial  fibrillation or in sinus rhythm.  He will occasionally feel his pulse and feel that it is irregular.  He typically will take a propranolol tablet with conversion back to sinus rhythm in less than an hour. Still exercising at the [facility] 5 days a week. Checked out sleep studies,   Was going to cost $4,000.  Diabetes is better controlled on Trulicity.   May 25, 2020:   Robert [name] is seen back today for a follow-up visit.  He has a history of paroxysmal atrial fibrillation, hyperlipidemia, diabetes mellitus. Has notice more Afib this past week.  Has been using his propranolol more  Has been under more stress  has changed is diet to a more plant based diet Trigs have improved.   Oc.t 25, 2022: Robert [name] is seen back today for follow up visit . 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 Has had hematuria while on eliquis  Dr. Junious Silk and I have worked out a  stragy that he stops his Eliquis 3-4 days pror to his BCG  Then restart the Eliquis on the evening of his BCG. Has 6 more treatments.   Nov. 7,  2023 Robert [name] is seen for follow up of his PAF   No cp , no dyspnea  Has gotten used to the Afib , sometimes its difficult to tell when he has the Afib Rarely has rapid Afib   He is planning on having hip surgery early next year.  He is not having any complications.  He will be at low risk for his upcoming hip surgery.  It would be okay to hold the Eliquis for 2 to 3 days prior to his hip surgery.  He should restart the Eliquis as soon as possible following his hip surgery.   Past Medical History:  Diagnosis Date   Anticoagulant long-term use    eliquis--- managed by  cardiology   Bladder cancer [facility])    urologist--- dr Junious Silk;  overlapping   History of basal cell carcinoma ([place]) excision    History of COVID-19 05/2019   per pt mild symptoms that resolved   HLD (hyperlipidemia)    HTN (hypertension)    Hx of colonic polyps    Insulin dependent type 2 diabetes mellitus [facility])    endocrinologist--- [name] --  (03-11-2022 per pt check blood sugar 1-2 times daily,  fasting sugar-- 118-120s)   Nocturia    Paroxysmal atrial fibrillation ([place]) 03/12/2019   cardiologist--- [name]   Ureterocele     Past Surgical History:  Procedure Laterality Date   BLEPHAROPLASTY Bilateral    per pt approx  2001;   upper eyelid's   COLONOSCOPY     last one approx 2020   PATELLAR TENDON REPAIR Left 04/16/2004   '@[facility]'$    TRANSURETHRAL RESECTION OF BLADDER TUMOR N/A 06/18/2021   Procedure: TRANSURETHRAL RESECTION OF BLADDER TUMOR (TURBT) WITH RIGHT URETERAL STENT PLACEMENT, RIGHT URETEROSCOPY WITH DISTRACTION OF TUMOR, FULGURATION;  Surgeon: Festus Aloe, MD;  Location: [facility];  Service: Urology;  Laterality: N/A;   TRANSURETHRAL RESECTION OF BLADDER TUMOR N/A 03/15/2022   Procedure: TRANSURETHRAL RESECTION OF BLADDER TUMOR (TURBT) WITH BILATERAL URETEROSCOPY/URETHRAL/ DILATION/BILATERAL RETROGRADE PYELOGRAM/BIOPSY AND FULGURATION OF RIGHT URETERAL CANCER/RIGHT STENT PLACEMENT;  Surgeon: Festus Aloe, MD;  Location: [facility];  Service: Urology;  Laterality: N/A;    Current Medications: Current Meds  Medication Sig   acetaminophen (TYLENOL) 500 MG tablet Take 1,000 mg by mouth every 6 (six) hours as needed.   atorvastatin (LIPITOR) 40 MG tablet TAKE ONE TABLET BY MOUTH ONCE DAILY   b complex vitamins capsule Take 1 capsule by mouth daily.   Dulaglutide (TRULICITY) 3 RF/1.6BW SOPN Inject 3 mg into the skin once a week. (Patient taking differently: Inject 3 mg into the skin once a week. Tuesday's)   ELIQUIS 5 MG TABS  tablet TAKE ONE TABLET BY MOUTH TWICE DAILY   glipiZIDE (GLUCOTROL XL) 2.5 MG 24 hr tablet Take 1 tablet (2.5 mg total) by mouth 2 (two) times daily before a meal. (Patient taking differently: Take 2.5 mg by mouth 2 (two) times daily before a meal. Patient takes as need)   glucose blood test strip 1 each by Other route daily. Use daily for glucose control - Dx 250.00   hydrochlorothiazide (MICROZIDE) 12.5 MG capsule TAKE ONE CAPSULE BY MOUTH ONCE DAILY   lisinopril (ZESTRIL) 40 MG tablet Take 1 tablet (40 mg total) by mouth daily.   metFORMIN (GLUCOPHAGE) 1000  MG tablet Take 1 tablet (1,000 mg total) by mouth 2 (two) times daily with a meal.   metoprolol succinate (TOPROL-XL) 25 MG 24 hr tablet TAKE ONE TABLET BY MOUTH ONCE DAILY   Multiple Vitamin (MULTIVITAMIN) tablet Take 1 tablet by mouth daily.   propranolol (INDERAL) 10 MG tablet TAKE 1 TABLET BY MOUTH 4 TIMES DAILY AS NEEDED FOR PALPITATIONS, IRREGULAR OR FAST HEART RATE   sildenafil (REVATIO) 20 MG tablet TAKE 1 TO 2 TABLETS 2 TO 3 HOURS BEFORE SEX   [DISCONTINUED] lisinopril (ZESTRIL) 40 MG tablet TAKE 1 AND 1/2 TABLETS BY MOUTH ONCE DAILY (Patient taking differently: Take 40 mg by mouth daily. TAKE 1 AND 1/2 TABLETS BY MOUTH ONCE DAILY)     Allergies:   Patient has no known allergies.   Social History   Socioeconomic History   Marital status: Married    Spouse name: Not on file   Number of children: Not on file   Years of education: Not on file   Highest education level: Not on file  Occupational History   Occupation: DVD's   Occupation: Cabin crew: Self Employed  Tobacco Use   Smoking status: Some Days    Types: Cigars    Last attempt to quit: 02/22/2011    Years since quitting: 11.5   Smokeless tobacco: Never  Vaping Use   Vaping Use: Never used  Substance and Sexual Activity   Alcohol use: Yes    Alcohol/week: 0.0 standard drinks of alcohol    Comment: 6-8oz /wk   Drug use: Never   Sexual activity:  Not on file  Other Topics Concern   Not on file  Social History Narrative   Patient is married he has been employed in Radiographer, therapeutic   Former smoker with occasional alcohol use   Social Determinants of Health   Financial Resource Strain: Low Risk  (04/21/2022)   Overall Financial Resource Strain (CARDIA)    Difficulty of Paying Living Expenses: Not hard at all  Food Insecurity: No Food Insecurity (04/21/2022)   Hunger Vital Sign    Worried About Running Out of Food in the Last Year: Never true    [place] in the Last Year: Never true  Transportation Needs: No Transportation Needs (04/21/2022)   PRAPARE - Hydrologist (Medical): No    Lack of Transportation (Non-Medical): No  Physical Activity: Sufficiently Active (04/21/2022)   Exercise Vital Sign    Days of Exercise per Week: 5 days    Minutes of Exercise per Session: 30 min  Stress: No Stress Concern Present (04/21/2022)   [place]    Feeling of Stress : Not at all  Social Connections: [place] (04/21/2022)   Social Connection and Isolation Panel [NHANES]    Frequency of Communication with Friends and Family: More than three times a week    Frequency of Social Gatherings with Friends and Family: More than three times a week    Attends Religious Services: 1 to 4 times per year    Active Member of [employer] or Organizations: No    Attends Music therapist: 1 to 4 times per year    Marital Status: Married     Family History: The patient's family history includes Aortic aneurysm in an other family member; Cancer in an other family member; Pancreatic cancer (age of onset: 59) in his mother. There is no  history of Colon cancer, Rectal cancer, Stomach cancer, or Esophageal cancer.  ROS:   Please see the history of present illness.     All other systems reviewed and are negative.  EKGs/Labs/Other Studies  Reviewed:    The following studies were reviewed today:   EKG:    August 30, 2022: Normal sinus rhythm at 86.  Occasional premature atrial contraction.   Recent Labs: 03/15/2022: BUN 13; Creatinine, Ser 0.60; Hemoglobin 17.0; Potassium 4.0; Sodium 138  Recent Lipid Panel    Component Value Date/Time   CHOL 100 04/20/2021 1102   TRIG 119.0 04/20/2021 1102   HDL 41.40 04/20/2021 1102   CHOLHDL 2 04/20/2021 1102   VLDL 23.8 04/20/2021 1102   LDLCALC 35 04/20/2021 1102   LDLDIRECT 77.0 07/07/2015 0930    Physical Exam:     Physical Exam: Blood pressure 128/74, pulse 86, height '6\' 3"'$  (1.905 m), weight 203 lb 12.8 oz (92.4 kg), SpO2 98 %.       GEN:  Well nourished, well developed in no acute distress HEENT: Normal NECK: No JVD; No carotid bruits LYMPHATICS: No lymphadenopathy CARDIAC: RRR , no murmurs, rubs, gallops RESPIRATORY:  Clear to auscultation without rales, wheezing or rhonchi  ABDOMEN: Soft, non-tender, non-distended MUSCULOSKELETAL:  No edema; No deformity  SKIN: Warm and dry NEUROLOGIC:  Alert and oriented x 3     ASSESSMENT:    1. Essential hypertension   2. PAF (paroxysmal atrial fibrillation) (HCC)      PLAN:     Paroxysmal atrial fibrillation:   CHADS2VASC  = 3  ( age 45, DM, HTN)  He is maintaining sinus rhythm.  He takes propranolol on an as-needed basis if he has any breakthrough episodes of atrial fibrillation.  Seems to be tolerating well.    2.  HTN:    BP is well controlled.   3.  Pre op visit for possible hip surgery :   He is planning on having hip surgery early next year.  He is not having any cardiac complications.  He will be at low risk for his upcoming hip surgery.  It would be okay to hold the Eliquis for 2 to 3 days prior to his hip surgery.  He should restart the Eliquis as soon as possible following his hip surgery.      Medication Adjustments/Labs and Tests Ordered: Current medicines are reviewed at length with the  patient today.  Concerns regarding medicines are outlined above.  Orders Placed This Encounter  Procedures   EKG 12-Lead    Meds ordered this encounter  Medications   lisinopril (ZESTRIL) 40 MG tablet    Sig: Take 1 tablet (40 mg total) by mouth daily.    Dispense:  90 tablet    Refill:  3    Dose change     Signed, Mertie Moores, MD  08/30/2022 5:51 PM    [facility]

## 2022-09-05 ENCOUNTER — Other Ambulatory Visit: Payer: Self-pay | Admitting: Cardiovascular Disease

## 2022-09-06 DIAGNOSIS — D485 Neoplasm of uncertain behavior of skin: Secondary | ICD-10-CM | POA: Diagnosis not present

## 2022-09-06 DIAGNOSIS — L281 Prurigo nodularis: Secondary | ICD-10-CM | POA: Diagnosis not present

## 2022-09-06 DIAGNOSIS — L821 Other seborrheic keratosis: Secondary | ICD-10-CM | POA: Diagnosis not present

## 2022-09-06 DIAGNOSIS — D1801 Hemangioma of skin and subcutaneous tissue: Secondary | ICD-10-CM | POA: Diagnosis not present

## 2022-09-06 DIAGNOSIS — Z85828 Personal history of other malignant neoplasm of skin: Secondary | ICD-10-CM | POA: Diagnosis not present

## 2022-09-09 ENCOUNTER — Encounter: Payer: Self-pay | Admitting: Internal Medicine

## 2022-09-20 ENCOUNTER — Other Ambulatory Visit: Payer: Self-pay | Admitting: Internal Medicine

## 2022-09-20 DIAGNOSIS — E119 Type 2 diabetes mellitus without complications: Secondary | ICD-10-CM

## 2022-09-21 ENCOUNTER — Other Ambulatory Visit: Payer: Self-pay | Admitting: Internal Medicine

## 2022-09-21 DIAGNOSIS — E119 Type 2 diabetes mellitus without complications: Secondary | ICD-10-CM

## 2022-09-22 MED ORDER — SEMAGLUTIDE (1 MG/DOSE) 4 MG/3ML ~~LOC~~ SOPN: 1.0000 mg | PEN_INJECTOR | SUBCUTANEOUS | 5 refills | Status: DC

## 2022-10-04 ENCOUNTER — Other Ambulatory Visit: Payer: Self-pay | Admitting: Cardiovascular Disease

## 2022-10-04 DIAGNOSIS — I480 Paroxysmal atrial fibrillation: Secondary | ICD-10-CM

## 2022-10-04 NOTE — Telephone Encounter (Signed)
Prescription refill request for Eliquis received. Indication:afib Last office visit:11/23 Scr:0.6 Age:70  Weight:92.4  kg  Prescription refilled

## 2022-10-11 ENCOUNTER — Encounter: Payer: Self-pay | Admitting: Internal Medicine

## 2022-10-17 ENCOUNTER — Encounter: Payer: Self-pay | Admitting: Internal Medicine

## 2022-10-19 ENCOUNTER — Encounter: Payer: Self-pay | Admitting: *Deleted

## 2022-10-19 ENCOUNTER — Telehealth: Payer: Self-pay | Admitting: *Deleted

## 2022-10-19 NOTE — Patient Instructions (Signed)
Visit Information  Thank you for taking time to visit with me today. Please don't hesitate to contact me if I can be of assistance to you.   Following are the goals we discussed today:   Goals Addressed             This Visit's Progress    COMPLETED: Care coordination activity       Care Coordination Interventions: Reviewed medications with patient and discussed adherence with no needed refills Reviewed scheduled/upcoming provider appointments including sufficient transportation source Assessed social determinant of health barriers Educated on care management services involving nurse care manager, social workers and pharmacy. Pt indicates no needs today however agreeable to information on MyChart.         Please call the care guide team at [phone] if you need to cancel or reschedule your appointment.   If you are experiencing a Mental Health or [place] or need someone to talk to, please call the Suicide and Crisis Lifeline: 988  Patient verbalizes understanding of instructions and care plan provided today and agrees to view in [place]. Active MyChart status and patient understanding of how to access instructions and care plan via MyChart confirmed with patient.     No further follow up required: No follow up needs   Raina Mina, RN Care Management Coordinator [place] Office [phone]

## 2022-10-19 NOTE — Patient Outreach (Signed)
  Care Coordination   Initial Visit Note   10/19/2022 Name: Robert [name] MRN: [id number] DOB: 1951-02-28  Robert [name] is a 71 y.o. year old male who sees Plotnikov, Evie Lacks, MD for primary care. I spoke with  Robert [name] by phone today.  What matters to the patients health and wellness today?  No needs    Goals Addressed             This Visit's Progress    COMPLETED: Care coordination activity       Care Coordination Interventions: Reviewed medications with patient and discussed adherence with no needed refills Reviewed scheduled/upcoming provider appointments including sufficient transportation source Assessed social determinant of health barriers Educated on care management services involving nurse care manager, social workers and pharmacy. Pt indicates no needs today however agreeable to information on MyChart.         SDOH assessments and interventions completed:  Yes  SDOH Interventions Today    Flowsheet Row Most Recent Value  SDOH Interventions   Food Insecurity Interventions Intervention Not Indicated  Housing Interventions Intervention Not Indicated  Transportation Interventions Intervention Not Indicated  Utilities Interventions Intervention Not Indicated        Care Coordination Interventions:  Yes, provided   Follow up plan: No further intervention required.   Encounter Outcome:  Pt. Visit Completed   Raina Mina, RN Care Management Coordinator [place] Office [phone]

## 2022-10-20 NOTE — Telephone Encounter (Signed)
Pls advise on email.../lmb 

## 2022-10-23 ENCOUNTER — Other Ambulatory Visit: Payer: Self-pay | Admitting: Cardiovascular Disease

## 2022-10-25 ENCOUNTER — Other Ambulatory Visit: Payer: Self-pay | Admitting: Internal Medicine

## 2022-10-25 DIAGNOSIS — I10 Essential (primary) hypertension: Secondary | ICD-10-CM

## 2022-10-27 DIAGNOSIS — C678 Malignant neoplasm of overlapping sites of bladder: Secondary | ICD-10-CM | POA: Diagnosis not present

## 2022-10-31 ENCOUNTER — Telehealth: Payer: Self-pay | Admitting: Cardiovascular Disease

## 2022-10-31 ENCOUNTER — Other Ambulatory Visit: Payer: Self-pay | Admitting: Urology

## 2022-10-31 NOTE — Telephone Encounter (Signed)
   Patient Name: Robert [name]  DOB: 1951-04-13 MRN: [id number]  Primary Cardiologist: Mertie Moores, MD  Chart reviewed as part of pre-operative protocol coverage. Pre-op clearance already addressed by colleagues in earlier phone notes. To summarize recommendations:  -He is planning on having hip surgery early next year. He is not having any complications. He will be at low risk for his upcoming hip surgery. It would be okay to hold the Eliquis for 2 to 3 days prior to his hip surgery. He should restart the Eliquis as soon as possible following his hip surgery.  -Dr. Acie Fredrickson  Will route this bundled recommendation to requesting provider via Epic fax function and remove from pre-op pool. Please call with questions.  Elgie Collard, PA-C 10/31/2022, 3:54 PM

## 2022-10-31 NOTE — Telephone Encounter (Signed)
Patient with diagnosis of afib on Eliquis for anticoagulation.    Procedure: Transurethral Resection of Bladder Tumor  Date of procedure: 12/02/22   CHA2DS2-VASc Score = 3   This indicates a 3.2% annual risk of stroke. The patient's score is based upon: CHF History: 0 HTN History: 1 Diabetes History: 1 Stroke History: 0 Vascular Disease History: 0 Age Score: 1 Gender Score: 0      CrCl >100 ml/min  Per office protocol, patient can hold Eliquis for 2-3 days prior to procedure.    **This guidance is not considered finalized until pre-operative APP has relayed final recommendations.**

## 2022-10-31 NOTE — Telephone Encounter (Signed)
   Pre-operative Risk Assessment    Patient Name: Robert [name]  DOB: Apr 01, 1951 MRN: [id number]      Request for Surgical Clearance    Procedure:   Transurethral Resection of Bladder Tumor  Date of Surgery:  Clearance 12/02/22                                 Surgeon:  Dr. Thomasenia Bottoms Surgeon's Group or Practice Name:  [facility] Phone number:  [phone] Fax number:  [phone]   Type of Clearance Requested:   - Pharmacy:  Hold Apixaban (Eliquis)     Type of Anesthesia:  General    Additional requests/questions:  Please advise surgeon/provider what medications should be held.  Signed, Belisicia T Harris   10/31/2022, 9:39 AM

## 2022-11-17 NOTE — Progress Notes (Addendum)
Patient interview over the phone. Coming in for blood work 11/25/22 at 1400.  COVID Vaccine Completed: yes  Date of COVID positive in last 90 days: no  PCP - Lew Dawes, MD Cardiologist - Cleatrice Burke, MD  Cardiac clearance by Nicholes Rough 10/31/22 in Epic  Chest x-ray - n/a EKG - 08/30/22 Epic Stress Test - n/a ECHO - 11/16/17 Epic Cardiac Cath - n/a Pacemaker/ICD device last checked: n/a Spinal Cord Stimulator: n/a  Bowel Prep - no per pt  Sleep Study - n/a CPAP -   Fasting Blood Sugar - 107-128 Checks Blood Sugar 1 times a day  Last dose of GLP1 agonist-  Ozempic, takes Thursday hold 7 days GLP1 instructions:  last dose 11/24/22, do not take 12/01/22   Last dose of SGLT-2 inhibitors-  N/A SGLT-2 instructions: N/A   Blood Thinner Instructions: Eliquis, hold 3 days Aspirin Instructions: Last Dose: 11/28/22 2100  Activity level: Can go up a flight of stairs and perform activities of daily living without stopping and without symptoms of chest pain or shortness of breath.  Anesthesia review: HTN, a fib, DM2  Patient denies shortness of breath, fever, cough and chest pain at PAT appointment  Patient verbalized understanding of instructions that were given to them at the PAT appointment. Patient was also instructed that they will need to review over the PAT instructions again at home before surgery.

## 2022-11-21 ENCOUNTER — Encounter (HOSPITAL_COMMUNITY)
Admission: RE | Admit: 2022-11-21 | Discharge: 2022-11-21 | Disposition: A | Discharge status: Home / Self Care | Payer: Medicare HMO | Source: Ambulatory Visit | Attending: Urology | Admitting: Urology

## 2022-11-21 ENCOUNTER — Encounter (HOSPITAL_COMMUNITY): Payer: Self-pay

## 2022-11-21 VITALS — Ht 75.0 in | Wt 198.0 lb

## 2022-11-21 DIAGNOSIS — I10 Essential (primary) hypertension: Secondary | ICD-10-CM

## 2022-11-21 DIAGNOSIS — E119 Type 2 diabetes mellitus without complications: Secondary | ICD-10-CM

## 2022-11-21 NOTE — Patient Instructions (Addendum)
SURGICAL WAITING ROOM VISITATION  Patients having surgery or a procedure may have no more than 2 support people in the waiting area - these visitors may rotate.    Children under the age of 31 must have an adult with them who is not the patient.  Due to an increase in RSV and influenza rates and associated hospitalizations, children ages 30 and under may not visit patients in [place].  If the patient needs to stay at the hospital during part of their recovery, the visitor guidelines for inpatient rooms apply. Pre-op nurse will coordinate an appropriate time for 1 support person to accompany patient in pre-op.  This support person may not rotate.    Please refer to the [facility] ([facility]) website for the visitor guidelines for Inpatients (after your surgery is over and you are in a regular room).    Your procedure is scheduled on: 12/02/22   Report to [facility] Main Entrance    Report to admitting at 12:15 PM   Call this number if you have problems the morning of surgery [phone]   Do not eat food or drink liquids :After Midnight.          If you have questions, please contact your surgeon's office.   FOLLOW BOWEL PREP AND ANY ADDITIONAL PRE OP INSTRUCTIONS YOU RECEIVED FROM YOUR SURGEON'S OFFICE!!!     Oral Hygiene is also important to reduce your risk of infection.                                    Remember - BRUSH YOUR TEETH THE MORNING OF SURGERY WITH YOUR REGULAR TOOTHPASTE  DENTURES WILL BE REMOVED PRIOR TO SURGERY PLEASE DO NOT APPLY "Poly grip" OR ADHESIVES!!!   Do NOT smoke after Midnight   Take these medicines the morning of surgery with A SIP OF WATER: Tylenol, Atorvastatin, Flonase, Metoprolol, Propranolol   These are anesthesia recommendations for holding your anticoagulants.  Please contact your prescribing physician to confirm IF it is safe to hold your anticoagulants for this length of time.   Eliquis Apixaban   72 hours   Xarelto  Rivaroxaban   72 hours  Plavix Clopidogrel   120 hours  Pletal Cilostazol   120 hours    DO NOT TAKE ANY ORAL DIABETIC MEDICATIONS DAY OF YOUR SURGERY  How to Manage Your Diabetes Before and After Surgery  Why is it important to control my blood sugar before and after surgery? Improving blood sugar levels before and after surgery helps healing and can limit problems. A way of improving blood sugar control is eating a healthy diet by:  Eating less sugar and carbohydrates  Increasing activity/exercise  Talking with your doctor about reaching your blood sugar goals High blood sugars (greater than 180 mg/dL) can raise your risk of infections and slow your recovery, so you will need to focus on controlling your diabetes during the weeks before surgery. Make sure that the doctor who takes care of your diabetes knows about your planned surgery including the date and location.  How do I manage my blood sugar before surgery? Check your blood sugar at least 4 times a day, starting 2 days before surgery, to make sure that the level is not too high or low. Check your blood sugar the morning of your surgery when you wake up and every 2 hours until you get to the Short Stay unit.  If your blood sugar is less than 70 mg/dL, you will need to treat for low blood sugar: Do not take insulin. Treat a low blood sugar (less than 70 mg/dL) with  cup of clear juice (cranberry or apple), 4 glucose tablets, OR glucose gel. Recheck blood sugar in 15 minutes after treatment (to make sure it is greater than 70 mg/dL). If your blood sugar is not greater than 70 mg/dL on recheck, call [phone] for further instructions. Report your blood sugar to the short stay nurse when you get to Short Stay.  If you are admitted to the hospital after surgery: Your blood sugar will be checked by the staff and you will probably be given insulin after surgery (instead of oral diabetes medicines) to make sure you have good blood  sugar levels. The goal for blood sugar control after surgery is 80-180 mg/dL.   WHAT DO I DO ABOUT MY DIABETES MEDICATION?  Do not take oral diabetes medicines (pills) the morning of surgery  Do not take Ozempic for 7 days. Do not take 12/01/22. May resume after surgery.  THE DAY BEFORE SURGERY, take Metformin as prescribed.     THE MORNING OF SURGERY, do not take Metformin.   DO NOT TAKE THE FOLLOWING 7 DAYS PRIOR TO SURGERY: Ozempic, Wegovy, Rybelsus (Semaglutide), Byetta (exenatide), Bydureon (exenatide ER), Victoza, Saxenda (liraglutide), or Trulicity (dulaglutide) Mounjaro (Tirzepatide) Adlyxin (Lixisenatide), Polyethylene Glycol Loxenatide.   Reviewed and Endorsed by [facility], The Patient Education Committee, August 2015                              You may not have any metal on your body including jewelry, and body piercing             Do not wear lotions, powders, cologne, or deodorant              Men may shave face and neck.   Do not bring valuables to the hospital. [place].   Contacts, glasses, dentures or bridgework may not be worn into surgery.  DO NOT [place]. PHARMACY WILL DISPENSE MEDICATIONS LISTED ON YOUR MEDICATION LIST TO YOU DURING YOUR ADMISSION [place]!    Patients discharged on the day of surgery will not be allowed to drive home.  Someone NEEDS to stay with you for the first 24 hours after anesthesia.   Special Instructions: Bring a copy of your healthcare power of attorney and living will documents the day of surgery if you haven't scanned them before.              Please read over the following fact sheets you were given: IF [place] [phone] Schneiders    If you received a COVID test during your pre-op visit  it is requested that you wear a mask when out in public, stay away from anyone that may not be  feeling well and notify your surgeon if you develop symptoms. If you test positive for Covid or have been in contact with anyone that has tested positive in the last 10 days please notify you surgeon.    [place] - Preparing for Surgery Before surgery, you can play an important role.  Because skin is not sterile, your skin needs to be as  free of germs as possible.  You can reduce the number of germs on your skin by washing with CHG (chlorahexidine gluconate) soap before surgery.  CHG is an antiseptic cleaner which kills germs and bonds with the skin to continue killing germs even after washing. Please DO NOT use if you have an allergy to CHG or antibacterial soaps.  If your skin becomes reddened/irritated stop using the CHG and inform your nurse when you arrive at Short Stay. Do not shave (including legs and underarms) for at least 48 hours prior to the first CHG shower.  You may shave your face/neck.  Please follow these instructions carefully:  1.  Shower with CHG Soap the night before surgery and the  morning of surgery.  2.  If you choose to wash your hair, wash your hair first as usual with your normal  shampoo.  3.  After you shampoo, rinse your hair and body thoroughly to remove the shampoo.                             4.  Use CHG as you would any other liquid soap.  You can apply chg directly to the skin and wash.  Gently with a scrungie or clean washcloth.  5.  Apply the CHG Soap to your body ONLY FROM THE NECK DOWN.   Do   not use on face/ open                           Wound or open sores. Avoid contact with eyes, ears mouth and   genitals (private parts).                       Wash face,  Genitals (private parts) with your normal soap.             6.  Wash thoroughly, paying special attention to the area where your    surgery  will be performed.  7.  Thoroughly rinse your body with warm water from the neck down.  8.  DO NOT shower/wash with your normal soap after using and rinsing off  the CHG Soap.                9.  Pat yourself dry with a clean towel.            10.  Wear clean pajamas.            11.  Place clean sheets on your bed the night of your first shower and do not  sleep with pets. Day of Surgery : Do not apply any lotions/deodorants the morning of surgery.  Please wear clean clothes to the hospital/surgery center.  FAILURE TO FOLLOW THESE INSTRUCTIONS MAY RESULT IN THE CANCELLATION OF YOUR SURGERY  PATIENT SIGNATURE_________________________________  NURSE SIGNATURE__________________________________  ________________________________________________________________________

## 2022-11-25 ENCOUNTER — Encounter (HOSPITAL_COMMUNITY)
Admission: RE | Admit: 2022-11-25 | Discharge: 2022-11-25 | Disposition: A | Discharge status: Home / Self Care | Payer: Medicare HMO | Source: Ambulatory Visit | Attending: Urology | Admitting: Urology

## 2022-11-25 DIAGNOSIS — Z8616 Personal history of COVID-19: Secondary | ICD-10-CM | POA: Insufficient documentation

## 2022-11-25 DIAGNOSIS — Z7901 Long term (current) use of anticoagulants: Secondary | ICD-10-CM | POA: Diagnosis not present

## 2022-11-25 DIAGNOSIS — Z01812 Encounter for preprocedural laboratory examination: Secondary | ICD-10-CM | POA: Insufficient documentation

## 2022-11-25 DIAGNOSIS — I480 Paroxysmal atrial fibrillation: Secondary | ICD-10-CM | POA: Insufficient documentation

## 2022-11-25 DIAGNOSIS — Z794 Long term (current) use of insulin: Secondary | ICD-10-CM | POA: Insufficient documentation

## 2022-11-25 DIAGNOSIS — E119 Type 2 diabetes mellitus without complications: Secondary | ICD-10-CM | POA: Diagnosis not present

## 2022-11-25 DIAGNOSIS — C679 Malignant neoplasm of bladder, unspecified: Secondary | ICD-10-CM | POA: Diagnosis not present

## 2022-11-25 DIAGNOSIS — I10 Essential (primary) hypertension: Secondary | ICD-10-CM | POA: Diagnosis not present

## 2022-11-25 LAB — CBC
HCT: 48.5 % (ref 39.0–52.0)
Hemoglobin: 16.9 g/dL (ref 13.0–17.0)
MCH: 31.8 pg (ref 26.0–34.0)
MCHC: 34.8 g/dL (ref 30.0–36.0)
MCV: 91.3 fL (ref 80.0–100.0)
Platelets: 195 10*3/uL (ref 150–400)
RBC: 5.31 MIL/uL (ref 4.22–5.81)
RDW: 13 % (ref 11.5–15.5)
WBC: 10.3 10*3/uL (ref 4.0–10.5)
nRBC: 0 % (ref 0.0–0.2)

## 2022-11-25 LAB — HEMOGLOBIN A1C
Hgb A1c MFr Bld: 6.2 % — ABNORMAL HIGH (ref 4.8–5.6)
Mean Plasma Glucose: 131.24 mg/dL

## 2022-11-25 LAB — BASIC METABOLIC PANEL
Anion gap: 9 (ref 5–15)
BUN: 17 mg/dL (ref 8–23)
CO2: 25 mmol/L (ref 22–32)
Calcium: 9.2 mg/dL (ref 8.9–10.3)
Chloride: 102 mmol/L (ref 98–111)
Creatinine, Ser: 0.85 mg/dL (ref 0.61–1.24)
GFR, Estimated: >60 mL/min (ref >60)
Glucose, Bld: 136 mg/dL — ABNORMAL HIGH (ref 70–99)
Potassium: 3.7 mmol/L (ref 3.5–5.1)
Sodium: 136 mmol/L (ref 135–145)

## 2022-11-28 NOTE — Progress Notes (Signed)
Anesthesia Chart Review   Case: [id number] Date/Time: 12/02/22 1415   Procedures:      TRANSURETHRAL RESECTION OF BLADDER TUMOR WITH GEMCITABINE     CYSTOSCOPY WITH RIGHT URETEROSCOPY AND STENT PLACEMENT (Right) - 90 MINS FOR CASE   Anesthesia type: General   Pre-op diagnosis: BLADDER CANCER   Location: [place] / [facility]   Surgeons: Festus Aloe, MD       DISCUSSION:72 y.o. smoker with h/o HTN, PAF, DM II, bladder cancer scheduled for above procedure 12/02/2022 with Dr. Festus Aloe.   Per cardiology preoperative evaluation 10/31/2022, "Chart reviewed as part of pre-operative protocol coverage. Pre-op clearance already addressed by colleagues in earlier phone notes. To summarize recommendations:   -He is planning on having hip surgery early next year. He is not having any complications. He will be at low risk for his upcoming hip surgery. It would be okay to hold the Eliquis for 2 to 3 days prior to his hip surgery. He should restart the Eliquis as soon as possible following his hip surgery.  -Dr. Acie Fredrickson"  Pt reports last dose of Eliquis 11/28/2022.   Anticipate pt can proceed with planned procedure barring acute status change.   VS: BP (!) 149/88   Pulse 68   Temp 36.4 C (Oral)   Resp 16   Ht '6\' 3"'$  (1.905 m)   Wt 91.7 kg   SpO2 100%   BMI 25.28 kg/m   PROVIDERS: Plotnikov, Evie Lacks, MD is PCP   Cardiologist - Cleatrice Burke, MD  LABS: Labs reviewed: Acceptable for surgery. (all labs ordered are listed, but only abnormal results are displayed)  Labs Reviewed  HEMOGLOBIN A1C - Abnormal; Notable for the following components:      Result Value   Hgb A1c MFr Bld 6.2 (*)    All other components within normal limits  BASIC METABOLIC PANEL - Abnormal; Notable for the following components:   Glucose, Bld 136 (*)    All other components within normal limits  CBC     IMAGES:   EKG:   CV: Echo 11/16/2017 Study Conclusions   - Left ventricle: The cavity  size was normal. There was severe    concentric hypertrophy. Systolic function was normal. The    estimated ejection fraction was in the range of 60% to 65%. Wall    motion was normal; there were no regional wall motion    abnormalities. There was an increased relative contribution of    atrial contraction to ventricular filling. Doppler parameters are    consistent with abnormal left ventricular relaxation (grade 1    diastolic dysfunction).  - Aortic valve: Trileaflet; mildly thickened, mildly calcified    leaflets.  - Aorta: Aortic root dimension: 40 mm (ED). Ascending aorta    diameter: 38 mm (ED).  - Aortic root: The aortic root was mildly dilated.  - Mitral valve: Calcified annulus.  - Pulmonic valve: There was trivial regurgitation.  - Pulmonary arteries: Systolic pressure could not be accurately    estimated.  Past Medical History:  Diagnosis Date   Anticoagulant long-term use    eliquis--- managed by cardiology   Bladder cancer [facility])    urologist--- dr Junious Silk;  overlapping   History of basal cell carcinoma ([place]) excision    History of COVID-19 05/2019   per pt mild symptoms that resolved   HLD (hyperlipidemia)    HTN (hypertension)    Hx of colonic polyps    Insulin dependent type 2 diabetes mellitus (  [facility])    endocrinologist--- [name] --  (03-11-2022 per pt check blood sugar 1-2 times daily,  fasting sugar-- 118-120s)   Nocturia    Paroxysmal atrial fibrillation ([place]) 03/12/2019   cardiologist--- [name]   Ureterocele     Past Surgical History:  Procedure Laterality Date   BLEPHAROPLASTY Bilateral    per pt approx  2001;   upper eyelid's   COLONOSCOPY     last one approx 2020   PATELLAR TENDON REPAIR Left 04/16/2004   '@[facility]'$    TRANSURETHRAL RESECTION OF BLADDER TUMOR N/A 06/18/2021   Procedure: TRANSURETHRAL RESECTION OF BLADDER TUMOR (TURBT) WITH RIGHT URETERAL STENT PLACEMENT, RIGHT URETEROSCOPY WITH DISTRACTION OF TUMOR, FULGURATION;  Surgeon: Festus Aloe, MD;  Location: [place];  Service: Urology;  Laterality: N/A;   TRANSURETHRAL RESECTION OF BLADDER TUMOR N/A 03/15/2022   Procedure: TRANSURETHRAL RESECTION OF BLADDER TUMOR (TURBT) WITH BILATERAL URETEROSCOPY/URETHRAL/ DILATION/BILATERAL RETROGRADE PYELOGRAM/BIOPSY AND FULGURATION OF RIGHT URETERAL CANCER/RIGHT STENT PLACEMENT;  Surgeon: Festus Aloe, MD;  Location: [facility];  Service: Urology;  Laterality: N/A;    MEDICATIONS:  acetaminophen (TYLENOL) 500 MG tablet   atorvastatin (LIPITOR) 40 MG tablet   Dulaglutide (TRULICITY) 3 ZH/2.9JM SOPN   ELIQUIS 5 MG TABS tablet   fluticasone (FLONASE) 50 MCG/ACT nasal spray   glipiZIDE (GLUCOTROL XL) 2.5 MG 24 hr tablet   glucose blood test strip   hydrochlorothiazide (MICROZIDE) 12.5 MG capsule   lisinopril (ZESTRIL) 40 MG tablet   metFORMIN (GLUCOPHAGE) 1000 MG tablet   metoprolol succinate (TOPROL-XL) 25 MG 24 hr tablet   Multiple Vitamins-Minerals (EMERGEN-C VITAMIN C) PACK   Multiple Vitamins-Minerals (ZINC PO)   propranolol (INDERAL) 10 MG tablet   Semaglutide, 1 MG/DOSE, 4 MG/3ML SOPN   sildenafil (REVATIO) 20 MG tablet   No current facility-administered medications for this encounter.     Konrad Felix Ward, PA-C [facility] Pre-Surgical Testing [phone]

## 2022-11-28 NOTE — Anesthesia Preprocedure Evaluation (Addendum)
Anesthesia Evaluation  Patient identified by MRN, date of birth, ID band Patient awake    Reviewed: Allergy & Precautions, NPO status , Patient's Chart, lab work & pertinent test results, reviewed documented beta blocker date and time   History of Anesthesia Complications Negative for: history of anesthetic complications  Airway Mallampati: III  TM Distance: >3 FB Neck ROM: Full    Dental  (+) Dental Advisory Given   Pulmonary neg shortness of breath, neg sleep apnea, neg COPD, neg recent URI, Current Smoker   Pulmonary exam normal breath sounds clear to auscultation       Cardiovascular hypertension, Pt. on medications and Pt. on home beta blockers (-) angina (-) Past MI, (-) Cardiac Stents and (-) CABG + dysrhythmias Atrial Fibrillation  Rhythm:Regular Rate:Normal  HLD  TTE 11/16/2017: Study Conclusions   - Left ventricle: The cavity size was normal. There was severe    concentric hypertrophy. Systolic function was normal. The    estimated ejection fraction was in the range of 60% to 65%. Wall    motion was normal; there were no regional wall motion    abnormalities. There was an increased relative contribution of    atrial contraction to ventricular filling. Doppler parameters are    consistent with abnormal left ventricular relaxation (grade 1    diastolic dysfunction).  - Aortic valve: Trileaflet; mildly thickened, mildly calcified    leaflets.  - Aorta: Aortic root dimension: 40 mm (ED). Ascending aorta    diameter: 38 mm (ED).  - Aortic root: The aortic root was mildly dilated.  - Mitral valve: Calcified annulus.  - Pulmonic valve: There was trivial regurgitation.  - Pulmonary arteries: Systolic pressure could not be accurately    estimated.     Neuro/Psych negative neurological ROS     GI/Hepatic negative GI ROS, Neg liver ROS,,,  Endo/Other  diabetes (Hgb A1c 6.2), Well Controlled, Type 2, Insulin Dependent     Renal/GU negative Renal ROS   Bladder cancer    Musculoskeletal  (+) Arthritis , Osteoarthritis,    Abdominal   Peds  Hematology negative hematology ROS (+)   Anesthesia Other Findings Last Eliquis: 11/29/2022  Last semiglutide: 11/17/2022  Reproductive/Obstetrics                             Anesthesia Physical Anesthesia Plan  ASA: 3  Anesthesia Plan: General   Post-op Pain Management:    Induction: Intravenous  PONV Risk Score and Plan: 1 and Ondansetron, Dexamethasone and Treatment may vary due to age or medical condition  Airway Management Planned: LMA  Additional Equipment:   Intra-op Plan:   Post-operative Plan: Extubation in OR  Informed Consent: I have reviewed the patients History and Physical, chart, labs and discussed the procedure including the risks, benefits and alternatives for the proposed anesthesia with the patient or authorized representative who has indicated his/her understanding and acceptance.     Dental advisory given  Plan Discussed with: CRNA and Anesthesiologist  Anesthesia Plan Comments: (Risks of general anesthesia discussed including, but not limited to, sore throat, hoarse voice, chipped/damaged teeth, injury to vocal cords, nausea and vomiting, allergic reactions, lung infection, heart attack, stroke, and death. All questions answered.   See PAT note 11/25/22)       Anesthesia Quick Evaluation

## 2022-11-30 ENCOUNTER — Ambulatory Visit: Admit: 2022-11-30 | Payer: Medicare HMO | Admitting: Orthopedic Surgery

## 2022-11-30 SURGERY — ARTHROPLASTY, HIP, TOTAL, ANTERIOR APPROACH
Anesthesia: Spinal | Site: Hip | Laterality: Right

## 2022-11-30 MED ORDER — ISOPROPYL ALCOHOL 70 % SOLN
Status: AC
  Filled 2022-11-30: qty 480

## 2022-12-02 ENCOUNTER — Encounter (HOSPITAL_COMMUNITY)
Admission: RE | Disposition: A | Discharge status: Home / Self Care | Payer: Self-pay | Source: Home / Self Care | Attending: Urology

## 2022-12-02 ENCOUNTER — Other Ambulatory Visit: Payer: Self-pay

## 2022-12-02 ENCOUNTER — Encounter (HOSPITAL_COMMUNITY): Payer: Self-pay | Admitting: Urology

## 2022-12-02 ENCOUNTER — Ambulatory Visit (HOSPITAL_COMMUNITY): Payer: Medicare HMO

## 2022-12-02 ENCOUNTER — Ambulatory Visit (HOSPITAL_COMMUNITY)
Admission: RE | Admit: 2022-12-02 | Discharge: 2022-12-02 | Disposition: A | Discharge status: Home / Self Care | Payer: Medicare HMO | Attending: Urology | Admitting: Urology

## 2022-12-02 ENCOUNTER — Ambulatory Visit (HOSPITAL_COMMUNITY): Payer: Medicare HMO | Admitting: Physician Assistant

## 2022-12-02 ENCOUNTER — Ambulatory Visit (HOSPITAL_BASED_OUTPATIENT_CLINIC_OR_DEPARTMENT_OTHER): Payer: Medicare HMO | Admitting: Anesthesiology

## 2022-12-02 DIAGNOSIS — C662 Malignant neoplasm of left ureter: Secondary | ICD-10-CM

## 2022-12-02 DIAGNOSIS — C674 Malignant neoplasm of posterior wall of bladder: Secondary | ICD-10-CM | POA: Diagnosis not present

## 2022-12-02 DIAGNOSIS — C675 Malignant neoplasm of bladder neck: Secondary | ICD-10-CM | POA: Diagnosis not present

## 2022-12-02 DIAGNOSIS — C679 Malignant neoplasm of bladder, unspecified: Secondary | ICD-10-CM | POA: Diagnosis not present

## 2022-12-02 DIAGNOSIS — I480 Paroxysmal atrial fibrillation: Secondary | ICD-10-CM | POA: Diagnosis not present

## 2022-12-02 DIAGNOSIS — I10 Essential (primary) hypertension: Secondary | ICD-10-CM | POA: Insufficient documentation

## 2022-12-02 DIAGNOSIS — C678 Malignant neoplasm of overlapping sites of bladder: Secondary | ICD-10-CM

## 2022-12-02 DIAGNOSIS — Z794 Long term (current) use of insulin: Secondary | ICD-10-CM

## 2022-12-02 DIAGNOSIS — F17210 Nicotine dependence, cigarettes, uncomplicated: Secondary | ICD-10-CM

## 2022-12-02 DIAGNOSIS — Z7901 Long term (current) use of anticoagulants: Secondary | ICD-10-CM | POA: Diagnosis not present

## 2022-12-02 DIAGNOSIS — F17290 Nicotine dependence, other tobacco product, uncomplicated: Secondary | ICD-10-CM | POA: Diagnosis not present

## 2022-12-02 DIAGNOSIS — E119 Type 2 diabetes mellitus without complications: Secondary | ICD-10-CM

## 2022-12-02 DIAGNOSIS — C671 Malignant neoplasm of dome of bladder: Secondary | ICD-10-CM | POA: Diagnosis not present

## 2022-12-02 DIAGNOSIS — E785 Hyperlipidemia, unspecified: Secondary | ICD-10-CM | POA: Diagnosis not present

## 2022-12-02 DIAGNOSIS — C661 Malignant neoplasm of right ureter: Secondary | ICD-10-CM | POA: Diagnosis not present

## 2022-12-02 DIAGNOSIS — M1990 Unspecified osteoarthritis, unspecified site: Secondary | ICD-10-CM | POA: Diagnosis not present

## 2022-12-02 DIAGNOSIS — Z79899 Other long term (current) drug therapy: Secondary | ICD-10-CM | POA: Diagnosis not present

## 2022-12-02 DIAGNOSIS — F17200 Nicotine dependence, unspecified, uncomplicated: Secondary | ICD-10-CM | POA: Diagnosis not present

## 2022-12-02 DIAGNOSIS — D4101 Neoplasm of uncertain behavior of right kidney: Secondary | ICD-10-CM | POA: Diagnosis not present

## 2022-12-02 HISTORY — PX: CYSTOSCOPY WITH URETEROSCOPY AND STENT PLACEMENT: SHX6377

## 2022-12-02 HISTORY — PX: TRANSURETHRAL RESECTION OF BLADDER TUMOR WITH MITOMYCIN-C: SHX6459

## 2022-12-02 LAB — GLUCOSE, CAPILLARY
Glucose-Capillary: 142 mg/dL — ABNORMAL HIGH (ref 70–99)
Glucose-Capillary: 127 mg/dL — ABNORMAL HIGH (ref 70–99)

## 2022-12-02 SURGERY — TRANSURETHRAL RESECTION OF BLADDER TUMOR WITH MITOMYCIN-C
Anesthesia: General | Laterality: Right

## 2022-12-02 MED ORDER — PHENYLEPHRINE 80 MCG/ML (10ML) SYRINGE FOR IV PUSH (FOR BLOOD PRESSURE SUPPORT)
PREFILLED_SYRINGE | INTRAVENOUS | Status: AC
  Filled 2022-12-02: qty 40

## 2022-12-02 MED ORDER — APIXABAN 5 MG PO TABS: 5.0000 mg | ORAL_TABLET | Freq: Two times a day (BID) | ORAL | 5 refills | Status: DC

## 2022-12-02 MED ORDER — FENTANYL CITRATE (PF) 100 MCG/2ML IJ SOLN
INTRAMUSCULAR | Status: AC
  Filled 2022-12-02: qty 2

## 2022-12-02 MED ORDER — OXYCODONE HCL 5 MG/5ML PO SOLN: 5.0000 mg | Freq: Once | ORAL | Status: DC | PRN

## 2022-12-02 MED ORDER — LIDOCAINE 2% (20 MG/ML) 5 ML SYRINGE
INTRAMUSCULAR | Status: DC | PRN
  Administered 2022-12-02: 60 mg via INTRAVENOUS

## 2022-12-02 MED ORDER — ROCURONIUM BROMIDE 10 MG/ML (PF) SYRINGE
PREFILLED_SYRINGE | INTRAVENOUS | Status: AC
  Filled 2022-12-02: qty 30

## 2022-12-02 MED ORDER — ACETAMINOPHEN 500 MG PO TABS
1000.0000 mg | ORAL_TABLET | Freq: Once | ORAL | Status: AC
  Administered 2022-12-02: 1000 mg via ORAL
  Filled 2022-12-02: qty 2

## 2022-12-02 MED ORDER — ROCURONIUM BROMIDE 10 MG/ML (PF) SYRINGE
PREFILLED_SYRINGE | INTRAVENOUS | Status: DC | PRN
  Administered 2022-12-02: 60 mg via INTRAVENOUS
  Administered 2022-12-02: 10 mg via INTRAVENOUS

## 2022-12-02 MED ORDER — GEMCITABINE CHEMO FOR BLADDER INSTILLATION 2000 MG: 2000.0000 mg | Freq: Once | INTRAVENOUS | Status: DC

## 2022-12-02 MED ORDER — PROPOFOL 10 MG/ML IV BOLUS
INTRAVENOUS | Status: DC | PRN
  Administered 2022-12-02: 160 mg via INTRAVENOUS

## 2022-12-02 MED ORDER — IOHEXOL 300 MG/ML  SOLN
INTRAMUSCULAR | Status: DC | PRN
  Administered 2022-12-02: 10 mL

## 2022-12-02 MED ORDER — LACTATED RINGERS IV SOLN: INTRAVENOUS | Status: DC

## 2022-12-02 MED ORDER — OXYCODONE HCL 5 MG PO TABS: 5.0000 mg | ORAL_TABLET | Freq: Once | ORAL | Status: DC | PRN

## 2022-12-02 MED ORDER — ORAL CARE MOUTH RINSE: 15.0000 mL | Freq: Once | OROMUCOSAL | Status: AC

## 2022-12-02 MED ORDER — AMISULPRIDE (ANTIEMETIC) 5 MG/2ML IV SOLN: 10.0000 mg | Freq: Once | INTRAVENOUS | Status: DC | PRN

## 2022-12-02 MED ORDER — SUGAMMADEX SODIUM 200 MG/2ML IV SOLN
INTRAVENOUS | Status: DC | PRN
  Administered 2022-12-02: 200 mg via INTRAVENOUS

## 2022-12-02 MED ORDER — SODIUM CHLORIDE 0.9 % IR SOLN
Status: DC | PRN
  Administered 2022-12-02: 12000 mL

## 2022-12-02 MED ORDER — FENTANYL CITRATE (PF) 100 MCG/2ML IJ SOLN
INTRAMUSCULAR | Status: DC | PRN
  Administered 2022-12-02 (×2): 100 ug via INTRAVENOUS
  Administered 2022-12-02: 50 ug via INTRAVENOUS

## 2022-12-02 MED ORDER — ONDANSETRON HCL 4 MG/2ML IJ SOLN
INTRAMUSCULAR | Status: DC | PRN
  Administered 2022-12-02: 4 mg via INTRAVENOUS

## 2022-12-02 MED ORDER — CEFAZOLIN SODIUM-DEXTROSE 2-4 GM/100ML-% IV SOLN
2.0000 g | INTRAVENOUS | Status: AC
  Administered 2022-12-02: 2 g via INTRAVENOUS
  Filled 2022-12-02: qty 100

## 2022-12-02 MED ORDER — METOPROLOL SUCCINATE ER 25 MG PO TB24
25.0000 mg | ORAL_TABLET | Freq: Once | ORAL | Status: AC
  Administered 2022-12-02: 25 mg via ORAL
  Filled 2022-12-02: qty 1

## 2022-12-02 MED ORDER — CHLORHEXIDINE GLUCONATE 0.12 % MT SOLN
15.0000 mL | Freq: Once | OROMUCOSAL | Status: AC
  Administered 2022-12-02: 15 mL via OROMUCOSAL

## 2022-12-02 MED ORDER — DEXAMETHASONE SODIUM PHOSPHATE 10 MG/ML IJ SOLN
INTRAMUSCULAR | Status: DC | PRN
  Administered 2022-12-02: 10 mg via INTRAVENOUS

## 2022-12-02 MED ORDER — FENTANYL CITRATE PF 50 MCG/ML IJ SOSY: 25.0000 ug | PREFILLED_SYRINGE | INTRAMUSCULAR | Status: DC | PRN

## 2022-12-02 MED ORDER — STERILE WATER FOR IRRIGATION IR SOLN
Status: DC | PRN
  Administered 2022-12-02: 3000 mL

## 2022-12-02 MED ORDER — PHENYLEPHRINE 80 MCG/ML (10ML) SYRINGE FOR IV PUSH (FOR BLOOD PRESSURE SUPPORT)
PREFILLED_SYRINGE | INTRAVENOUS | Status: DC | PRN
  Administered 2022-12-02: 160 ug via INTRAVENOUS

## 2022-12-02 SURGICAL SUPPLY — 25 items
BAG URINE DRAIN 2000ML AR STRL (UROLOGICAL SUPPLIES) IMPLANT
BAG URO CATCHER STRL LF (MISCELLANEOUS) ×2 IMPLANT
BASKET ZERO TIP NITINOL 2.4FR (BASKET) IMPLANT
CATH URET 5FR 28IN CONE TIP (BALLOONS)
CATH URET 5FR 70CM CONE TIP (BALLOONS) IMPLANT
CATH URETL OPEN END 6FR 70 (CATHETERS) ×2 IMPLANT
CLOTH BEACON ORANGE TIMEOUT ST (SAFETY) ×2 IMPLANT
DRAPE FOOT SWITCH (DRAPES) ×2 IMPLANT
GLOVE BIO SURGEON STRL SZ7.5 (GLOVE) ×2 IMPLANT
GLOVE SURG LX STRL 7.5 STRW (GLOVE) ×2 IMPLANT
GOWN STRL REUS W/ TWL XL LVL3 (GOWN DISPOSABLE) ×2 IMPLANT
GOWN STRL REUS W/TWL XL LVL3 (GOWN DISPOSABLE) ×2
GUIDEWIRE STR DUAL SENSOR (WIRE) ×2 IMPLANT
GUIDEWIRE ZIPWRE .038 STRAIGHT (WIRE) IMPLANT
KIT TURNOVER KIT A (KITS) IMPLANT
LASER FIB FLEXIVA PULSE ID 365 (Laser) IMPLANT
LOOP CUT BIPOLAR 24F LRG (ELECTROSURGICAL) IMPLANT
MANIFOLD NEPTUNE II (INSTRUMENTS) ×2 IMPLANT
PACK CYSTO (CUSTOM PROCEDURE TRAY) ×2 IMPLANT
SHEATH NAVIGATOR HD 11/13X28 (SHEATH) IMPLANT
SHEATH NAVIGATOR HD 11/13X36 (SHEATH) IMPLANT
TRACTIP FLEXIVA PULS ID 200XHI (Laser) IMPLANT
TRACTIP FLEXIVA PULSE ID 200 (Laser)
TUBING CONNECTING 10 (TUBING) ×2 IMPLANT
TUBING UROLOGY SET (TUBING) ×2 IMPLANT

## 2022-12-02 NOTE — Transfer of Care (Signed)
Immediate Anesthesia Transfer of Care Note  Patient: Robert [name]  Procedure(s) Performed: TRANSURETHRAL RESECTION OF BLADDER TUMOR CYSTOSCOPY WITH RIGHT URETEROSCOPY, RIGHT DIAGNOSTIC URETEROSCOPY (Right)  Patient Location: PACU  Anesthesia Type:General  Level of Consciousness: awake, alert , and oriented  Airway & Oxygen Therapy: Patient Spontanous Breathing and Patient connected to face mask oxygen  Post-op Assessment: Report given to RN and Post -op Vital signs reviewed and stable  Post vital signs: Reviewed and stable  Last Vitals:  Vitals Value Taken Time  BP 155/86 12/02/22 1633  Temp    Pulse 74 12/02/22 1634  Resp 12 12/02/22 1634  SpO2 100 % 12/02/22 1634  Vitals shown include unvalidated device data.  Last Pain:  Vitals:   12/02/22 1237  TempSrc:   PainSc: 0-No pain         Complications: No notable events documented.

## 2022-12-02 NOTE — H&P (Signed)
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  He underwent BCG induction x 2 but on recent office cystoscopy there was a small recurrence on the right bladder wall, left bladder neck and the dome.  I could also see into the right distal ureter and there may be a few papillary tumors.  His urinalysis was negative.  He has had no dysuria or gross hematuria.  He has had some mild congestion and sinus pressure but no sore throat, cough or fever.  Past Medical History:  Diagnosis Date   Anticoagulant long-term use    eliquis--- managed by cardiology   Bladder cancer [facility])    urologist--- dr Junious Silk;  overlapping   History of basal cell carcinoma ([place]) excision    History of COVID-19 05/2019   per pt mild symptoms that resolved   HLD (hyperlipidemia)    HTN (hypertension)    Hx of colonic polyps    Insulin dependent type 2 diabetes mellitus [facility])    endocrinologist--- [name] --  (03-11-2022 per pt check blood sugar 1-2 times daily,  fasting sugar-- 118-120s)   Nocturia    Paroxysmal atrial fibrillation ([place]) 03/12/2019   cardiologist--- [name]   Ureterocele    Past Surgical History:  Procedure Laterality Date   BLEPHAROPLASTY Bilateral    per pt approx  2001;   upper eyelid's   COLONOSCOPY     last one approx 2020   PATELLAR TENDON REPAIR Left 04/16/2004   @[facility]$    TRANSURETHRAL RESECTION OF BLADDER TUMOR N/A 06/18/2021   Procedure: TRANSURETHRAL RESECTION OF BLADDER TUMOR (TURBT) WITH RIGHT URETERAL STENT PLACEMENT, RIGHT URETEROSCOPY WITH DISTRACTION OF TUMOR, FULGURATION;  Surgeon: Festus Aloe, MD;  Location: [place];  Service: Urology;  Laterality: N/A;   TRANSURETHRAL RESECTION OF BLADDER TUMOR N/A 03/15/2022   Procedure: TRANSURETHRAL RESECTION OF BLADDER TUMOR (TURBT) WITH BILATERAL  URETEROSCOPY/URETHRAL/ DILATION/BILATERAL RETROGRADE PYELOGRAM/BIOPSY AND FULGURATION OF RIGHT URETERAL CANCER/RIGHT STENT PLACEMENT;  Surgeon: Festus Aloe, MD;  Location: [facility];  Service: Urology;  Laterality: N/A;    Home Medications:  Medications Prior to Admission  Medication Sig Dispense Refill Last Dose   acetaminophen (TYLENOL) 500 MG tablet Take 1,000 mg by mouth every 6 (six) hours as needed.   Past Week   atorvastatin (LIPITOR) 40 MG tablet TAKE ONE TABLET BY MOUTH ONCE DAILY 90 tablet 1 Past Week   ELIQUIS 5 MG TABS tablet TAKE ONE TABLET BY MOUTH TWICE DAILY 60 tablet 5 11/29/2022   fluticasone (FLONASE) 50 MCG/ACT nasal spray Place 1 spray into both nostrils daily as needed for allergies or rhinitis.   Past Week   glucose blood test strip 1 each by Other route daily. Use daily for glucose control - Dx 250.00 100 each 0 12/01/2022   hydrochlorothiazide (MICROZIDE) 12.5 MG capsule Take 1 capsule (12.5 mg total) by mouth daily. 30 capsule 11 Past Week   lisinopril (ZESTRIL) 40 MG tablet Take 1 tablet (40 mg total) by mouth daily. 90 tablet 3 Past Week   metFORMIN (GLUCOPHAGE) 1000 MG tablet Take 1 tablet (1,000 mg total) by mouth 2 (two) times daily with a meal. 180 tablet 3 Past Week   metoprolol succinate (TOPROL-XL) 25 MG 24 hr tablet TAKE ONE TABLET BY MOUTH ONCE DAILY 90 tablet 3 11/30/2022   Multiple Vitamins-Minerals (EMERGEN-C  VITAMIN C) PACK Take 1 packet by mouth daily as needed (immune support).   Past Month   Multiple Vitamins-Minerals (ZINC PO) Take 1 tablet by mouth daily as needed (immune support).   Past Month   propranolol (INDERAL) 10 MG tablet TAKE 1 TABLET BY MOUTH 4 TIMES DAILY AS NEEDED FOR PALPITATIONS, IRREGULAR OR FAST HEART RATE 270 tablet 1 Past Week   Semaglutide, 1 MG/DOSE, 4 MG/3ML SOPN Inject 1 mg as directed once a week. 3 mL 5 11/17/2022   Dulaglutide (TRULICITY) 3 0000000 SOPN INJECT 3 MG INTO THE SKIN ONCE A WEEK. (Patient not  taking: Reported on 11/17/2022) 6 mL 9 Not Taking   glipiZIDE (GLUCOTROL XL) 2.5 MG 24 hr tablet Take 1 tablet (2.5 mg total) by mouth 2 (two) times daily before a meal. (Patient not taking: Reported on 11/17/2022) 180 tablet 3 Not Taking   sildenafil (REVATIO) 20 MG tablet TAKE 1 TO 2 TABLETS 2 TO 3 HOURS BEFORE SEX 30 tablet 5 More than a month   Allergies: No Known Allergies  Family History  Problem Relation Age of Onset   Aortic aneurysm Other        family history   Cancer Other        Breast (1st degree relative)   Pancreatic cancer Mother 42   Colon cancer Neg Hx    Rectal cancer Neg Hx    Stomach cancer Neg Hx    Esophageal cancer Neg Hx    Social History:  reports that he has been smoking cigars. He has never used smokeless tobacco. He reports current alcohol use. He reports that he does not use drugs.  ROS: A complete review of systems was performed.  All systems are negative except for pertinent findings as noted. Review of Systems  All other systems reviewed and are negative.    Physical Exam:  Vital signs in last 24 hours: Temp:  [97.8 F (36.6 C)] 97.8 F (36.6 C) (02/09 1225) Pulse Rate:  [70] 70 (02/09 1225) Resp:  [16] 16 (02/09 1225) BP: (137)/(82) 137/82 (02/09 1225) SpO2:  [98 %] 98 % (02/09 1225) Weight:  [91 kg-91.7 kg] 91 kg (02/09 1237) General:  Alert and oriented, No acute distress HEENT: Normocephalic, atraumatic Cardiovascular: Regular rate and rhythm Lungs: Regular rate and effort Abdomen: Soft, nontender, nondistended, no abdominal masses Back: No CVA tenderness Extremities: No edema Neurologic: Grossly intact  Laboratory Data:  Results for orders placed or performed during the hospital encounter of 12/02/22 (from the past 24 hour(s))  Glucose, capillary     Status: Abnormal   Collection Time: 12/02/22 12:26 PM  Result Value Ref Range   Glucose-Capillary 142 (H) 70 - 99 mg/dL   Comment 1 Notify RN    No results found for this or any  previous visit (from the past 240 hour(s)). Creatinine: No results for input(s): "CREATININE" in the last 168 hours.  Impression/Assessment:  Bladder cancer, right distal ureteral cancer-  Plan:  I discussed with the patient the nature, potential benefits, risks and alternatives to cystoscopy with bladder biopsy possible TURBT, right ureteroscopy with fulguration of tumor and right ureteral stent placement, including side effects of the proposed treatment, the likelihood of the patient achieving the goals of the procedure, and any potential problems that might occur during the procedure or recuperation. All questions answered. Patient elects to proceed.  We also discussed depending on pathology consideration of other adjuvant therapies.  He will try to like to get his hip done  and we discussed we will have to see what the pathology ahows and then if he needs oncology referral.   Festus Aloe 12/02/2022

## 2022-12-02 NOTE — Anesthesia Procedure Notes (Signed)
Procedure Name: Intubation Date/Time: 12/02/2022 3:23 PM  Performed by: Gean Maidens, CRNAPre-anesthesia Checklist: Patient identified, Emergency Drugs available, Suction available and Timeout performed Patient Re-evaluated:Patient Re-evaluated prior to induction Oxygen Delivery Method: Circle system utilized Preoxygenation: Pre-oxygenation with 100% oxygen Induction Type: IV induction Ventilation: Mask ventilation without difficulty Laryngoscope Size: Mac and 4 Grade View: Grade I Tube type: Oral Tube size: 7.5 mm Number of attempts: 1 Airway Equipment and Method: Stylet Placement Confirmation: ETT inserted through vocal cords under direct vision, positive ETCO2 and breath sounds checked- equal and bilateral Secured at: 23 cm Tube secured with: Tape Dental Injury: Teeth and Oropharynx as per pre-operative assessment

## 2022-12-02 NOTE — Op Note (Signed)
Preoperative diagnosis: Bladder cancer, right distal ureteral cancer Postoperative diagnosis: Same  Procedure: Cystoscopy, right retrograde pyelogram right diagnostic ureteroscopy, TURBT 2 to 5 cm  Surgeon: Junious Silk  Anesthesia: General  Indication for procedure: Robert [name] is a 72 year old male who initially presented with a right ureterocele full of papillary tumor.  That was resected and path showed high-grade TA disease in August 2022.  He was given BCG x 6.  He was taken for a recurrence in May 2023 which revealed high-grade TA left bladder with focal T1, high-grade TA at the dome and high-grade papillary urothelial the right distal ureter.  We left a stent and reintroduce BCG x 6.  On recent office cystoscopy I can see of the right ureteral orifice and that it look like there might be some tumor there.  Also there was some papillary tumor along the left and right bladder neck, right posterior and the dome.  Findings: On exam under anesthesia the penis is circumcised without mass or lesion.  On DRE the prostate was about 40 g and smooth without hard area or nodule.  No palpable bladder mass.  On cystoscopy there was some small amount of papillary tumor at the right and left bladder neck.  BN was a very small recurrence and difficult to get pathology.  There was papillary tumor right posterior and papillary tumor at the dome.  These were all resected.  Right retrograde pyelogram this outlined a filling defect in the right distal ureter and at the right renal pelvis.  Right ureteroscopy-this confirmed papillary tumor along the right distal ureter about 3 cm and a 8 to 10 mm papillary tumor up in the right renal pelvis.  I did not do a biopsy as this had previously been shown to be high-grade disease as well as given the multifocal nature.   Description of procedure: After consent was obtained patient brought to the operating room.  After adequate anesthesia he was placed in lithotomy position and  prepped and draped in the usual sterile fashion.  Timeout was performed to confirm the patient and procedure.  Exam under anesthesia was performed.  Cystoscope was passed per urethra and the bladder carefully inspected with 30 and 70 degree lens.  5 [place] open-ended catheter was used to cannulate the right ureteral orifice and retrograde injection of contrast was performed.  The cystoscope was removed and a semirigid ureteroscope was advanced which revealed recurrence of the high-grade tumor in the right distal ureter up in the mid and proximal ureter no tumor was found.  I then passed a sensor wire and coiled that in the upper calyx and the semirigid was backed out.  The single channel digital ureteroscope was then advanced up into the right renal pelvis.  A small papillary tumor was noted in the right renal pelvis.  The scope was backed down the ureter inspected no other areas of tumor were noted.  His ureter was widely patent and given the prior ureteral cell resection there was good drainage and Efflux and therefore I did not leave a stent.  Attention was then turned to the bladder.  We switched over to saline and using the loop the right and left bladder neck was resected.  These were very superficial and small recurrences and difficult to get pathology.  Any abnormal areas were fulgurated.  I then resected the 3 areas at the dome.  I then resected the right posterior.  There was some edematous and erythematous right posterior which would have been difficult to  loop and therefore I used the cold cup biopsy forceps to take 2 biopsies here and then fulgurated with the loop.  All 3 areas were sent separately.  I was not concerned about perforation.  Hemostasis was excellent low-pressure.  Left about 150 cc in his bladder and backed the scope out.  He was then awakened taken recovery room in stable condition.  Complications: None  Blood loss: Minimal  Specimens to pathology: #1 bladder neck resection  for biopsy #2 dome resection for biopsy #3 right posterior resection for biopsy and cold cup biopsy  Drains: None  Disposition: Patient stable to PACU

## 2022-12-04 ENCOUNTER — Encounter (HOSPITAL_COMMUNITY): Payer: Self-pay | Admitting: Urology

## 2022-12-04 NOTE — Anesthesia Postprocedure Evaluation (Signed)
Anesthesia Post Note  Patient: Robert [name]  Procedure(s) Performed: TRANSURETHRAL RESECTION OF BLADDER TUMOR CYSTOSCOPY WITH RIGHT URETEROSCOPY, RIGHT DIAGNOSTIC URETEROSCOPY (Right)     Patient location during evaluation: PACU Anesthesia Type: General Level of consciousness: awake Pain management: pain level controlled Vital Signs Assessment: post-procedure vital signs reviewed and stable Respiratory status: spontaneous breathing, nonlabored ventilation and respiratory function stable Cardiovascular status: blood pressure returned to baseline and stable Postop Assessment: no apparent nausea or vomiting Anesthetic complications: no   No notable events documented.  Last Vitals:  Vitals:   12/02/22 1700 12/02/22 1715  BP: 131/89 134/78  Pulse: 68 65  Resp: 15 19  Temp:  36.6 C  SpO2: 100% 96%    Last Pain:  Vitals:   12/02/22 1715  TempSrc:   PainSc: 0-No pain                 Nilda Simmer

## 2022-12-06 LAB — SURGICAL PATHOLOGY

## 2022-12-15 DIAGNOSIS — R8271 Bacteriuria: Secondary | ICD-10-CM | POA: Diagnosis not present

## 2022-12-15 DIAGNOSIS — C661 Malignant neoplasm of right ureter: Secondary | ICD-10-CM | POA: Diagnosis not present

## 2022-12-15 DIAGNOSIS — C678 Malignant neoplasm of overlapping sites of bladder: Secondary | ICD-10-CM | POA: Diagnosis not present

## 2022-12-19 ENCOUNTER — Telehealth: Payer: Self-pay | Admitting: Hematology

## 2022-12-19 NOTE — Telephone Encounter (Signed)
Scheduled appt per 2/26 referral. Pt is aware of appt date and time. Pt is aware to arrive 15 mins prior to appt time and to bring and updated insurance card. Pt is aware of appt location.

## 2022-12-21 ENCOUNTER — Inpatient Hospital Stay: Payer: Medicare HMO | Attending: Hematology | Admitting: Licensed Clinical Social Worker

## 2022-12-21 DIAGNOSIS — C679 Malignant neoplasm of bladder, unspecified: Secondary | ICD-10-CM

## 2022-12-21 NOTE — Progress Notes (Signed)
[place] [facility]  Initial Assessment   Robert [name] is a 72 y.o. year old male contacted by phone. Clinical Social Work was referred by medical provider for assessment of psychosocial needs.   SDOH (Social Determinants of Health) assessments performed: Yes SDOH Interventions    Flowsheet Row Telephone from 10/19/2022 in [place] from 04/21/2022 in [place] at [facility] from 08/31/2020 in [place] at [facility] Interventions Intervention Not Indicated Intervention Not Indicated --  Housing Interventions Intervention Not Indicated Intervention Not Indicated --  Transportation Interventions Intervention Not Indicated Intervention Not Indicated Intervention Not Indicated  Utilities Interventions Intervention Not Indicated -- --  Financial Strain Interventions -- Intervention Not Indicated Intervention Not Indicated  Physical Activity Interventions -- Intervention Not Indicated --  Stress Interventions -- Intervention Not Indicated --  Social Connections Interventions -- Intervention Not Indicated --       SDOH Screenings   Food Insecurity: No Food Insecurity (10/19/2022)  Housing: Low Risk  (10/19/2022)  Transportation Needs: No Transportation Needs (10/19/2022)  Utilities: Not At Risk (10/19/2022)  Alcohol Screen: Low Risk  (04/21/2022)  Depression (PHQ2-9): Low Risk  (04/21/2022)  Financial Resource Strain: Low Risk  (04/21/2022)  Physical Activity: Sufficiently Active (04/21/2022)  Social Connections: Socially Integrated (04/21/2022)  Stress: No Stress Concern Present (04/21/2022)  Tobacco Use: High Risk (12/04/2022)     Distress Screen completed: No     No data to display            Family/Social Information:  Housing Arrangement: patient lives with his wife Robert [name].  Pt states his wife has  not formally been diagnosed w/ dementia by a neurologist, but her memory is significantly impaired.   Family members/support persons in your life? 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  Income source: Paediatric nurse concerns: No Type of concern: None Food access concerns: no Religious or spiritual practice: Yes-[employer] Currently in place:  none  Coping/ Adjustment to diagnosis: Patient understands treatment plan and what happens next? yes Concerns about diagnosis and/or treatment:  Pt is concerned about who will provide supervision for his wife while he undergoes treatment as well as long term planning Patient reported stressors: Childcare/ elder care, Anxiety/ nervousness, and Adjusting to my illness Hopes and/or priorities: Pt's priority is to go to the consult with the oncologist to determine what treatment will be and start treatment w/ the hope of positive results Patient enjoys time with family/ friends Current coping skills/ strengths: Active sense of humor , Average or above average intelligence , Capable of independent living , Communication skills , Financial means , Motivation for treatment/growth , and Physical Health     SUMMARY: Current SDOH Barriers:  No barriers identified at this time.  Clinical Social Work Clinical Goal(s):  No goals at this time.  Interventions: Discussed common feeling and emotions when being diagnosed with cancer, and the importance of support during treatment Informed patient of the support team roles and support services at [facility] Provided CSW contact information and encouraged patient to call with any questions or concerns CSW provided pt w/ contact information for a [facility] to assist w/ long term planning if pt's spouse  needs  placement.  CSW encouraged pt to see if he has advance directives completed and if not advised of clinics, pt also is meeting w/ an elder attorney and working w/ Comptroller to find additional assistance in the home.    Follow Up Plan: Patient will contact CSW with any support or resource needs Patient verbalizes understanding of plan: Yes    Henriette Combs, LCSW

## 2022-12-29 ENCOUNTER — Ambulatory Visit: Payer: Self-pay | Admitting: Student

## 2023-01-02 ENCOUNTER — Other Ambulatory Visit: Payer: Self-pay

## 2023-01-02 ENCOUNTER — Inpatient Hospital Stay: Payer: Medicare HMO | Attending: Hematology | Admitting: Hematology

## 2023-01-02 ENCOUNTER — Encounter: Payer: Self-pay | Admitting: Hematology

## 2023-01-02 VITALS — BP 167/82 | HR 102 | Temp 98.1°F | Resp 18 | Ht 74.5 in | Wt 210.1 lb

## 2023-01-02 DIAGNOSIS — C675 Malignant neoplasm of bladder neck: Secondary | ICD-10-CM

## 2023-01-02 DIAGNOSIS — Z79620 Long term (current) use of immunosuppressive biologic: Secondary | ICD-10-CM | POA: Diagnosis not present

## 2023-01-02 DIAGNOSIS — C679 Malignant neoplasm of bladder, unspecified: Secondary | ICD-10-CM

## 2023-01-02 DIAGNOSIS — Z5112 Encounter for antineoplastic immunotherapy: Secondary | ICD-10-CM | POA: Insufficient documentation

## 2023-01-02 NOTE — Progress Notes (Signed)
START ON PATHWAY REGIMEN - Bladder     A cycle is every 21 days:     Pembrolizumab   **Always confirm dose/schedule in your pharmacy ordering system**  Patient Characteristics: Pre-Cystectomy or Nonsurgical Candidate, M0 (Clinical Staging), High-Grade cTa, cN0 or cTis/cT1, cN0, Refractory to Intravesical BCG and Referring Urologist Indicates Other Intravesical Therapies Not Preferred Therapeutic Status: Pre-Cystectomy or Nonsurgical Candidate, M0 (Clinical Staging) AJCC M Category: cM0 AJCC 8 Stage Grouping: I AJCC T Category: cT1 AJCC N Category: cN0 Intent of Therapy: Curative Intent, Discussed with Patient 

## 2023-01-02 NOTE — Assessment & Plan Note (Signed)
-  recurrent blad

## 2023-01-02 NOTE — Progress Notes (Signed)
[place]   Telephone:([phone]) [phone] Fax:([phone]) [place] Note   Patient Care Team: Plotnikov, Evie Lacks, MD as PCP - General (Internal Medicine) Nahser, [name], MD as PCP - Cardiology (Cardiology) Viona Gilmore, [facility] (Inactive) as Pharmacist (Pharmacist) [name], MD as Consulting Physician (Internal Medicine) Festus Aloe, MD as Consulting Physician (Urology) Gatha Mayer, MD as Consulting Physician (Gastroenterology) Marygrace Drought, MD as Consulting Physician (Ophthalmology) 01/02/2023  CHIEF COMPLAINTS/PURPOSE OF CONSULTATION:  Recurrent bladder cancer   REFERRING PHYSICIAN: Dr. Junious Silk   HISTORY OF PRESENTING ILLNESS:  Robert [name] 72 y.o. male is here because of recurrent early stage bladder cancer.  He was referred by his urologist Dr. Junious Silk.  He presents to the clinic with a friend.  He initially presented with hematuria in July 2022.  He underwent a cystoscopy and biopsy showed Ta high-grade bladder cancer in a right ureterocele and distal ureter in August 2022.  He underwent TURBT, right ureteral stent placement, and intravesical gemcitabine in August 2022.  Surgical path showed high-grade 2.4 cm Ta disease.  He completed BCG maintenance therapy 6 times in November 2022.  He had recurrence in May 2023, status post TURBT, right distal ureter biopsy showed high-grade T1 disease, and left bladder high-grade TA in dome.  He had a second BCG inductionX in August 2023.  He had a subtle recurrence in February 2024, status post TURBT, path showed high-grade T1 right bladder neck, high-grade Ta in [place], high-grade Ta right posterior, right URS, papillary tumor noted in distal 3 cm of right ureter and a 10 mm area in right renal pelvis.  His last CT image from May 2023 was negative.   He is clinically doing very well, denies any hematuria, bladder discomfort, or pain.  He was referred by Dr. Junious Silk to discuss the  systemic treatment.  MEDICAL HISTORY:  Past Medical History:  Diagnosis Date   Anticoagulant long-term use    eliquis--- managed by cardiology   Bladder cancer [facility])    urologist--- dr Junious Silk;  overlapping   History of basal cell carcinoma ([place]) excision    History of COVID-19 05/2019   per pt mild symptoms that resolved   HLD (hyperlipidemia)    HTN (hypertension)    Hx of colonic polyps    Insulin dependent type 2 diabetes mellitus [facility])    endocrinologist--- [name] --  (03-11-2022 per pt check blood sugar 1-2 times daily,  fasting sugar-- 118-120s)   Nocturia    Paroxysmal atrial fibrillation ([place]) 03/12/2019   cardiologist--- [name]   Ureterocele     SURGICAL HISTORY: Past Surgical History:  Procedure Laterality Date   BLEPHAROPLASTY Bilateral    per pt approx  2001;   upper eyelid's   COLONOSCOPY     last one approx 2020   CYSTOSCOPY WITH URETEROSCOPY AND STENT PLACEMENT Right 12/02/2022   Procedure: CYSTOSCOPY WITH RIGHT URETEROSCOPY, RIGHT DIAGNOSTIC URETEROSCOPY;  Surgeon: Festus Aloe, MD;  Location: [facility];  Service: Urology;  Laterality: Right;  90 MINS FOR CASE   PATELLAR TENDON REPAIR Left 04/16/2004   '@[facility]'$    TRANSURETHRAL RESECTION OF BLADDER TUMOR N/A 06/18/2021   Procedure: TRANSURETHRAL RESECTION OF BLADDER TUMOR (TURBT) WITH RIGHT URETERAL STENT PLACEMENT, RIGHT URETEROSCOPY WITH DISTRACTION OF TUMOR, FULGURATION;  Surgeon: Festus Aloe, MD;  Location: [facility];  Service: Urology;  Laterality: N/A;   TRANSURETHRAL RESECTION OF BLADDER TUMOR N/A 03/15/2022   Procedure: TRANSURETHRAL RESECTION OF BLADDER TUMOR (  TURBT) WITH BILATERAL URETEROSCOPY/URETHRAL/ DILATION/BILATERAL RETROGRADE PYELOGRAM/BIOPSY AND FULGURATION OF RIGHT URETERAL CANCER/RIGHT STENT PLACEMENT;  Surgeon: Festus Aloe, MD;  Location: [facility];  Service: Urology;  Laterality: N/A;   TRANSURETHRAL RESECTION OF BLADDER TUMOR WITH MITOMYCIN-C  N/A 12/02/2022   Procedure: TRANSURETHRAL RESECTION OF BLADDER TUMOR;  Surgeon: Festus Aloe, MD;  Location: [facility];  Service: Urology;  Laterality: N/A;    SOCIAL HISTORY: Social History   Socioeconomic History   Marital status: Married    Spouse name: Not on file   Number of children: 1   Years of education: Not on file   Highest education level: Not on file  Occupational History   Occupation: DVD's   Occupation: Cabin crew: Self Employed  Tobacco Use   Smoking status: Some Days    Types: Cigars    Last attempt to quit: 02/22/2011    Years since quitting: 11.8   Smokeless tobacco: Never  Vaping Use   Vaping Use: Never used  Substance and Sexual Activity   Alcohol use: Yes    Alcohol/week: 0.0 standard drinks of alcohol    Comment: 6-8oz /wk   Drug use: Never   Sexual activity: Not on file  Other Topics Concern   Not on file  Social History Narrative   Patient is married he has been employed in [employer], therapeutic   Former smoker with occasional alcohol use   Social Determinants of Health   Financial Resource Strain: Low Risk  (04/21/2022)   Overall Financial Resource Strain (CARDIA)    Difficulty of Paying Living Expenses: Not hard at all  Food Insecurity: No Food Insecurity (10/19/2022)   Hunger Vital Sign    Worried About Running Out of Food in the Last Year: Never true    [place] in the Last Year: Never true  Transportation Needs: No Transportation Needs (10/19/2022)   PRAPARE - Hydrologist (Medical): No    Lack of Transportation (Non-Medical): No  Physical Activity: Sufficiently Active (04/21/2022)   Exercise Vital Sign    Days of Exercise per Week: 5 days    Minutes of Exercise per Session: 30 min  Stress: No Stress Concern Present (04/21/2022)   [place]    Feeling of Stress : Not at all  Social Connections: [place]  (04/21/2022)   Social Connection and Isolation Panel [NHANES]    Frequency of Communication with Friends and Family: More than three times a week    Frequency of Social Gatherings with Friends and Family: More than three times a week    Attends Religious Services: 1 to 4 times per year    Active Member of [employer] or Organizations: No    Attends Music therapist: 1 to 4 times per year    Marital Status: Married  Human resources officer Violence: Not At Risk (04/21/2022)   Humiliation, Afraid, Rape, and Kick questionnaire    Fear of Current or Ex-Partner: No    Emotionally Abused: No    Physically Abused: No    Sexually Abused: No    FAMILY HISTORY: Family History  Problem Relation Age of Onset   Pancreatic cancer Mother 28   Cancer Paternal Uncle        unknown type cancer   Aortic aneurysm Other        family history   Cancer Other        Breast (  1st degree relative)   Colon cancer Neg Hx    Rectal cancer Neg Hx    Stomach cancer Neg Hx    Esophageal cancer Neg Hx     ALLERGIES:  has No Known Allergies.  MEDICATIONS:  Current Outpatient Medications  Medication Sig Dispense Refill   acetaminophen (TYLENOL) 500 MG tablet Take 1,000 mg by mouth every 6 (six) hours as needed.     apixaban (ELIQUIS) 5 MG TABS tablet Take 1 tablet (5 mg total) by mouth 2 (two) times daily. 60 tablet 5   atorvastatin (LIPITOR) 40 MG tablet TAKE ONE TABLET BY MOUTH ONCE DAILY 90 tablet 1   fluticasone (FLONASE) 50 MCG/ACT nasal spray Place 1 spray into both nostrils daily as needed for allergies or rhinitis.     glucose blood test strip 1 each by Other route daily. Use daily for glucose control - Dx 250.00 100 each 0   hydrochlorothiazide (MICROZIDE) 12.5 MG capsule Take 1 capsule (12.5 mg total) by mouth daily. 30 capsule 11   lisinopril (ZESTRIL) 40 MG tablet Take 1 tablet (40 mg total) by mouth daily. 90 tablet 3   metFORMIN (GLUCOPHAGE) 1000 MG tablet Take 1 tablet (1,000 mg total) by mouth  2 (two) times daily with a meal. 180 tablet 3   metoprolol succinate (TOPROL-XL) 25 MG 24 hr tablet TAKE ONE TABLET BY MOUTH ONCE DAILY 90 tablet 3   Multiple Vitamins-Minerals (EMERGEN-C VITAMIN C) PACK Take 1 packet by mouth daily as needed (immune support).     Multiple Vitamins-Minerals (ZINC PO) Take 1 tablet by mouth daily as needed (immune support).     propranolol (INDERAL) 10 MG tablet TAKE 1 TABLET BY MOUTH 4 TIMES DAILY AS NEEDED FOR PALPITATIONS, IRREGULAR OR FAST HEART RATE 270 tablet 1   Semaglutide, 1 MG/DOSE, 4 MG/3ML SOPN Inject 1 mg as directed once a week. 3 mL 5   sildenafil (REVATIO) 20 MG tablet TAKE 1 TO 2 TABLETS 2 TO 3 HOURS BEFORE SEX 30 tablet 5   No current facility-administered medications for this visit.    REVIEW OF SYSTEMS:   Constitutional: Denies fevers, chills or abnormal night sweats Eyes: Denies blurriness of vision, double vision or watery eyes Ears, nose, mouth, throat, and face: Denies mucositis or sore throat Respiratory: Denies cough, dyspnea or wheezes Cardiovascular: Denies palpitation, chest discomfort or lower extremity swelling Gastrointestinal:  Denies nausea, heartburn or change in bowel habits Skin: Denies abnormal skin rashes Lymphatics: Denies new lymphadenopathy or easy bruising Neurological:Denies numbness, tingling or new weaknesses Behavioral/Psych: Mood is stable, no new changes  Musculoskeletal: (+) He has moderate pain in the right hip, he would like to have a knee replacement soon. All other systems were reviewed with the patient and are negative.  PHYSICAL EXAMINATION: ECOG PERFORMANCE STATUS: 1 - Symptomatic but completely ambulatory  Vitals:   01/02/23 1500  BP: (!) 167/82  Pulse: (!) 102  Resp: 18  Temp: 98.1 F (36.7 C)  SpO2: 99%   Filed Weights   01/02/23 1500  Weight: 210 lb 1.6 oz (95.3 kg)    GENERAL:alert, no distress and comfortable SKIN: skin color, texture, turgor are normal, no rashes or significant  lesions EYES: normal, conjunctiva are pink and non-injected, sclera clear OROPHARYNX:no exudate, no erythema and lips, buccal mucosa, and tongue normal  NECK: supple, thyroid normal size, non-tender, without nodularity LYMPH:  no palpable lymphadenopathy in the cervical, axillary or inguinal LUNGS: clear to auscultation and percussion with normal breathing effort HEART: regular  rate & rhythm and no murmurs and no lower extremity edema ABDOMEN:abdomen soft, non-tender and normal bowel sounds Musculoskeletal:no cyanosis of digits and no clubbing  PSYCH: alert & oriented x 3 with fluent speech NEURO: no focal motor/sensory deficits  LABORATORY DATA:  I have reviewed the data as listed    Latest Ref Rng & Units 11/25/2022    1:49 PM 03/15/2022    9:36 AM 06/18/2021    9:04 AM  CBC  WBC 4.0 - 10.5 K/uL 10.3     Hemoglobin 13.0 - 17.0 g/dL 16.9  17.0  16.0   Hematocrit 39.0 - 52.0 % 48.5  50.0  47.0   Platelets 150 - 400 K/uL 195         Latest Ref Rng & Units 11/25/2022    1:49 PM 03/15/2022    9:36 AM 06/18/2021    9:04 AM  CMP  Glucose 70 - 99 mg/dL 136  152  151   BUN 8 - 23 mg/dL '17  13  14   '$ Creatinine 0.61 - 1.24 mg/dL 0.85  0.60  0.70   Sodium 135 - 145 mmol/L 136  138  137   Potassium 3.5 - 5.1 mmol/L 3.7  4.0  4.2   Chloride 98 - 111 mmol/L 102  98  100   CO2 22 - 32 mmol/L 25     Calcium 8.9 - 10.3 mg/dL 9.2        RADIOGRAPHIC STUDIES: I have personally reviewed the radiological images as listed and agreed with the findings in the report. No results found.  ASSESSMENT & PLAN: 72 year old gentleman  Bladder cancer ([place]) -He has had 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 -He is clinically doing well, asymptomatic.  Surgical resection was discussed with patient, but he would like to try nonsurgical options. -I discussed intravesical therapy, such as  gemcitabine or mitomycin, unfortunately it would not reach to the ureter and renal pelvis to treat all the disease that he has. -I recommend systemic therapy with immunotherapy pembrolizumab 200 mg every 3 weeks, or 400 mg every 6 weeks if he tolerates well.  This has been studied in the keynote 057 trial, it demonstrated 39% complete response in 3 months in BCG unresponsive high risk nonmuscle invasive bladder cancer. -I discussed the potential side effect with patient and his friend in details, which include, but not limited to, pneumonitis, colitis, thyroid dysfunction and other endocrine disorders, autoimmune related arthralgia, skin rash, and other autoimmune disease, sometimes could be severe and even fatal.  We discussed steroid treatment for moderate side effect, and stop treatment if patient develops severe side effects.  After lengthy discussion, patient would like to proceed. -The goal of therapy is curative. -Plan to start next week.  Plan -I recommend pembrolizumab every 3 weeks, starting next week -Chemo class -Follow-up before his first treatment.    Orders Placed This Encounter  Procedures   CBC with Differential ([place] Only)    Standing Status:   Future    Standing Expiration Date:   01/11/2024   CMP ([place] only)    Standing Status:   Future    Standing Expiration Date:   01/11/2024   T4    Standing Status:   Future    Standing Expiration Date:   01/11/2024   TSH    Standing Status:   Future    Standing Expiration Date:  01/11/2024   CBC with Differential ([place] Only)    Standing Status:   Future    Standing Expiration Date:   02/01/2024   CMP ([place] only)    Standing Status:   Future    Standing Expiration Date:   02/01/2024    All questions were answered. The patient knows to call the clinic with any problems, questions or concerns. I spent 45 minutes counseling the patient face to face. The total time spent in the appointment was 60  minutes and more than 50% was on counseling.     Truitt Merle, MD 01/02/2023 5:20 PM

## 2023-01-03 ENCOUNTER — Telehealth: Payer: Self-pay | Admitting: Hematology

## 2023-01-03 ENCOUNTER — Other Ambulatory Visit: Payer: Self-pay

## 2023-01-03 NOTE — Progress Notes (Signed)
Per Dr. Ernestina Penna request, Dr. Ernestina Penna office not epic faxed to Dr. Festus Aloe.  Fax confirmation received.

## 2023-01-03 NOTE — Telephone Encounter (Signed)
Contacted patient to scheduled appointments. Left message with appointment details and a call back number if patient had any questions or could not accommodate the time we provided.   

## 2023-01-05 ENCOUNTER — Other Ambulatory Visit: Payer: Medicare HMO

## 2023-01-05 NOTE — Progress Notes (Signed)
Pharmacist Chemotherapy Monitoring - Initial Assessment    Anticipated start date: 01/12/23   The following has been reviewed per standard work regarding the patient's treatment regimen: The patient's diagnosis, treatment plan and drug doses, and organ/hematologic function Lab orders and baseline tests specific to treatment regimen  The treatment plan start date, drug sequencing, and pre-medications Prior authorization status  Patient's documented medication list, including drug-drug interaction screen and prescriptions for anti-emetics and supportive care specific to the treatment regimen The drug concentrations, fluid compatibility, administration routes, and timing of the medications to be used The patient's access for treatment and lifetime cumulative dose history, if applicable  The patient's medication allergies and previous infusion related reactions, if applicable   Changes made to treatment plan:  N/A  Follow up needed:  N/A   Philomena Course, [place], 01/05/2023  12:46 PM

## 2023-01-09 ENCOUNTER — Encounter (HOSPITAL_COMMUNITY): Admission: RE | Admit: 2023-01-09 | Payer: Medicare HMO | Source: Ambulatory Visit

## 2023-01-09 DIAGNOSIS — C678 Malignant neoplasm of overlapping sites of bladder: Secondary | ICD-10-CM | POA: Diagnosis not present

## 2023-01-09 DIAGNOSIS — N3289 Other specified disorders of bladder: Secondary | ICD-10-CM | POA: Diagnosis not present

## 2023-01-10 ENCOUNTER — Inpatient Hospital Stay: Payer: Medicare HMO

## 2023-01-12 ENCOUNTER — Inpatient Hospital Stay: Payer: Medicare HMO

## 2023-01-12 ENCOUNTER — Inpatient Hospital Stay (HOSPITAL_BASED_OUTPATIENT_CLINIC_OR_DEPARTMENT_OTHER): Payer: Medicare HMO | Admitting: Hematology

## 2023-01-12 ENCOUNTER — Encounter: Payer: Self-pay | Admitting: Hematology

## 2023-01-12 VITALS — BP 135/82 | HR 68 | Temp 98.2°F | Resp 18

## 2023-01-12 VITALS — BP 133/73 | HR 69 | Temp 97.9°F | Resp 18 | Ht 74.5 in | Wt 211.9 lb

## 2023-01-12 DIAGNOSIS — C679 Malignant neoplasm of bladder, unspecified: Secondary | ICD-10-CM | POA: Diagnosis not present

## 2023-01-12 DIAGNOSIS — Z5112 Encounter for antineoplastic immunotherapy: Secondary | ICD-10-CM | POA: Diagnosis not present

## 2023-01-12 DIAGNOSIS — C675 Malignant neoplasm of bladder neck: Secondary | ICD-10-CM | POA: Diagnosis not present

## 2023-01-12 DIAGNOSIS — Z79620 Long term (current) use of immunosuppressive biologic: Secondary | ICD-10-CM | POA: Diagnosis not present

## 2023-01-12 LAB — CMP (CANCER CENTER ONLY)
ALT: 12 U/L (ref 0–44)
AST: 13 U/L — ABNORMAL LOW (ref 15–41)
Albumin: 4.5 g/dL (ref 3.5–5.0)
Alkaline Phosphatase: 68 U/L (ref 38–126)
Anion gap: 7 (ref 5–15)
BUN: 15 mg/dL (ref 8–23)
CO2: 27 mmol/L (ref 22–32)
Calcium: 9.5 mg/dL (ref 8.9–10.3)
Chloride: 103 mmol/L (ref 98–111)
Creatinine: 0.91 mg/dL (ref 0.61–1.24)
GFR, Estimated: >60 mL/min (ref >60)
Glucose, Bld: 149 mg/dL — ABNORMAL HIGH (ref 70–99)
Potassium: 3.9 mmol/L (ref 3.5–5.1)
Sodium: 137 mmol/L (ref 135–145)
Total Bilirubin: 0.7 mg/dL (ref 0.3–1.2)
Total Protein: 7.2 g/dL (ref 6.5–8.1)

## 2023-01-12 LAB — CBC WITH DIFFERENTIAL (CANCER CENTER ONLY)
Abs Immature Granulocytes: 0.1 10*3/uL — ABNORMAL HIGH (ref 0.00–0.07)
Basophils Absolute: 0.1 10*3/uL (ref 0.0–0.1)
Basophils Relative: 1 %
Eosinophils Absolute: 0 10*3/uL (ref 0.0–0.5)
Eosinophils Relative: 0 %
HCT: 44.8 % (ref 39.0–52.0)
Hemoglobin: 15.8 g/dL (ref 13.0–17.0)
Immature Granulocytes: 1 %
Lymphocytes Relative: 22 %
Lymphs Abs: 2.2 10*3/uL (ref 0.7–4.0)
MCH: 32.2 pg (ref 26.0–34.0)
MCHC: 35.3 g/dL (ref 30.0–36.0)
MCV: 91.2 fL (ref 80.0–100.0)
Monocytes Absolute: 0.7 10*3/uL (ref 0.1–1.0)
Monocytes Relative: 7 %
Neutro Abs: 6.9 10*3/uL (ref 1.7–7.7)
Neutrophils Relative %: 69 %
Platelet Count: 194 10*3/uL (ref 150–400)
RBC: 4.91 MIL/uL (ref 4.22–5.81)
RDW: 13.1 % (ref 11.5–15.5)
WBC Count: 10 10*3/uL (ref 4.0–10.5)
nRBC: 0 % (ref 0.0–0.2)

## 2023-01-12 LAB — TSH: TSH: 2.109 u[IU]/mL (ref 0.350–4.500)

## 2023-01-12 MED ORDER — SODIUM CHLORIDE 0.9 % IV SOLN
200.0000 mg | Freq: Once | INTRAVENOUS | Status: AC
  Administered 2023-01-12: 200 mg via INTRAVENOUS
  Filled 2023-01-12: qty 8

## 2023-01-12 MED ORDER — SODIUM CHLORIDE 0.9 % IV SOLN: Freq: Once | INTRAVENOUS | Status: AC

## 2023-01-12 NOTE — Research (Signed)
Immune Checkpoint Inhibitor Toxicity (I-CHECKIT): A Prospective Observational Study   Introduced the S2013 study to the patient, but the patient was not clinically eligible for study participation and will no longer be contact for this study.   Johny Drilling, [facility] 01/12/2023 1:56 PM

## 2023-01-12 NOTE — Patient Instructions (Signed)
[place]  Discharge Instructions: Thank you for choosing [facility] to provide your oncology and hematology care.   If you have a lab appointment with the [facility], please go directly to the [facility] and check in at the registration area.   Wear comfortable clothing and clothing appropriate for easy access to any Portacath or PICC line.   We strive to give you quality time with your provider. You may need to reschedule your appointment if you arrive late (15 or more minutes).  Arriving late affects you and other patients whose appointments are after yours.  Also, if you miss three or more appointments without notifying the office, you may be dismissed from the clinic at the provider's discretion.      For prescription refill requests, have your pharmacy contact our office and allow 72 hours for refills to be completed.    Today you received the following chemotherapy and/or immunotherapy agents  Pembrolizumab Beryle Flock)   To help prevent nausea and vomiting after your treatment, we encourage you to take your nausea medication as directed.  BELOW ARE SYMPTOMS THAT SHOULD BE REPORTED IMMEDIATELY: *FEVER GREATER THAN 100.4 F (38 C) OR HIGHER *CHILLS OR SWEATING *NAUSEA AND VOMITING THAT IS NOT CONTROLLED WITH YOUR NAUSEA MEDICATION *UNUSUAL SHORTNESS OF BREATH *UNUSUAL BRUISING OR BLEEDING *URINARY PROBLEMS (pain or burning when urinating, or frequent urination) *BOWEL PROBLEMS (unusual diarrhea, constipation, pain near the anus) TENDERNESS IN MOUTH AND THROAT WITH OR WITHOUT PRESENCE OF ULCERS (sore throat, sores in mouth, or a toothache) UNUSUAL RASH, SWELLING OR PAIN  UNUSUAL VAGINAL DISCHARGE OR ITCHING   Items with * indicate a potential emergency and should be followed up as soon as possible or go to the Emergency Department if any problems should occur.  Please show the CHEMOTHERAPY ALERT CARD or IMMUNOTHERAPY ALERT  CARD at check-in to the Emergency Department and triage nurse.  Should you have questions after your visit or need to cancel or reschedule your appointment, please contact [facility]  Dept: [phone]  and follow the prompts.  Office hours are 8:00 a.m. to 4:30 p.m. Monday - Friday. Please note that voicemails left after 4:00 p.m. may not be returned until the following business day.  We are closed weekends and major holidays. You have access to a nurse at all times for urgent questions. Please call the main number to the clinic Dept: [phone] and follow the prompts.   For any non-urgent questions, you may also contact your provider using MyChart. We now offer e-Visits for anyone 72 and older to request care online for non-urgent symptoms. For details visit [email or web address].   Also download the MyChart app! Go to the app store, search "MyChart", open the app, select [facility], and log in with your MyChart username and password.  Pembrolizumab Injection What is this medication? PEMBROLIZUMAB (PEM broe LIZ ue mab) treats some types of cancer. It works by helping your immune system slow or stop the spread of cancer cells. It is a monoclonal antibody. This medicine may be used for other purposes; ask your health care provider or pharmacist if you have questions. COMMON BRAND NAME(S): Keytruda What should I tell my care team before I take this medication? They need to know if you have any of these conditions: Allogeneic stem cell transplant (uses someone else's stem cells) Autoimmune diseases, such as Crohn disease, ulcerative colitis, lupus History of chest radiation Nervous system  problems, such as Guillain-Barre syndrome, myasthenia gravis Organ transplant An unusual or allergic reaction to pembrolizumab, other medications, foods, dyes, or preservatives Pregnant or trying to get pregnant Breast-feeding How should I use this medication? This  medication is injected into a vein. It is given by your care team in a hospital or clinic setting. A special MedGuide will be given to you before each treatment. Be sure to read this information carefully each time. Talk to your care team about the use of this medication in children. While it may be prescribed for children as young as 6 months for selected conditions, precautions do apply. Overdosage: If you think you have taken too much of this medicine contact a poison control center or emergency room at once. NOTE: This medicine is only for you. Do not share this medicine with others. What if I miss a dose? Keep appointments for follow-up doses. It is important not to miss your dose. Call your care team if you are unable to keep an appointment. What may interact with this medication? Interactions have not been studied. This list may not describe all possible interactions. Give your health care provider a list of all the medicines, herbs, non-prescription drugs, or dietary supplements you use. Also tell them if you smoke, drink alcohol, or use illegal drugs. Some items may interact with your medicine. What should I watch for while using this medication? Your condition will be monitored carefully while you are receiving this medication. You may need blood work while taking this medication. This medication may cause serious skin reactions. They can happen weeks to months after starting the medication. Contact your care team right away if you notice fevers or flu-like symptoms with a rash. The rash may be red or purple and then turn into blisters or peeling of the skin. You may also notice a red rash with swelling of the face, lips, or lymph nodes in your neck or under your arms. Tell your care team right away if you have any change in your eyesight. Talk to your care team if you may be pregnant. Serious birth defects can occur if you take this medication during pregnancy and for 4 months after the last  dose. You will need a negative pregnancy test before starting this medication. Contraception is recommended while taking this medication and for 4 months after the last dose. Your care team can help you find the option that works for you. Do not breastfeed while taking this medication and for 4 months after the last dose. What side effects may I notice from receiving this medication? Side effects that you should report to your care team as soon as possible: Allergic reactions--skin rash, itching, hives, swelling of the face, lips, tongue, or throat Dry cough, shortness of breath or trouble breathing Eye pain, redness, irritation, or discharge with blurry or decreased vision Heart muscle inflammation--unusual weakness or fatigue, shortness of breath, chest pain, fast or irregular heartbeat, dizziness, swelling of the ankles, feet, or hands Hormone gland problems--headache, sensitivity to light, unusual weakness or fatigue, dizziness, fast or irregular heartbeat, increased sensitivity to cold or heat, excessive sweating, constipation, hair loss, increased thirst or amount of urine, tremors or shaking, irritability Infusion reactions--chest pain, shortness of breath or trouble breathing, feeling faint or lightheaded Kidney injury (glomerulonephritis)--decrease in the amount of urine, red or dark brown urine, foamy or bubbly urine, swelling of the ankles, hands, or feet Liver injury--right upper belly pain, loss of appetite, nausea, light-colored stool, dark yellow or brown  urine, yellowing skin or eyes, unusual weakness or fatigue Pain, tingling, or numbness in the hands or feet, muscle weakness, change in vision, confusion or trouble speaking, loss of balance or coordination, trouble walking, seizures Rash, fever, and swollen lymph nodes Redness, blistering, peeling, or loosening of the skin, including inside the mouth Sudden or severe stomach pain, bloody diarrhea, fever, nausea, vomiting Side effects  that usually do not require medical attention (report to your care team if they continue or are bothersome): Bone, joint, or muscle pain Diarrhea Fatigue Loss of appetite Nausea Skin rash This list may not describe all possible side effects. Call your doctor for medical advice about side effects. You may report side effects to FDA at 1-800-FDA-1088. Where should I keep my medication? This medication is given in a hospital or clinic. It will not be stored at home. NOTE: This sheet is a summary. It may not cover all possible information. If you have questions about this medicine, talk to your doctor, pharmacist, or health care provider.  2023 Elsevier/Gold Standard (2022-02-22 00:00:00)

## 2023-01-12 NOTE — Progress Notes (Signed)
[facility]   Telephone:(336) [phone] Fax:(336) [phone]   Clinic Follow up Note   Patient Care Team: Plotnikov, Evie Lacks, MD as PCP - General (Internal Medicine) Nahser, [name], MD as PCP - Cardiology (Cardiology) Viona Gilmore, [facility] (Inactive) as Pharmacist (Pharmacist) [name], MD as Consulting Physician (Internal Medicine) Festus Aloe, MD as Consulting Physician (Urology) Gatha Mayer, MD as Consulting Physician (Gastroenterology) Marygrace Drought, MD as Consulting Physician (Ophthalmology) Truitt Merle, MD as Attending Physician (Hematology and Oncology)  Date of Service:  01/12/2023  CHIEF COMPLAINT: f/u of Recurrent bladder cancer   CURRENT THERAPY:  pembrolizumab every 3 weeks   ASSESSMENT:  Robert [name] is a 72 y.o. male with   Bladder cancer 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 -He is clinically doing well, asymptomatic.  Surgical resection was discussed with patient, but he would like to try nonsurgical options. -I discussed intravesical therapy, such as gemcitabine or mitomycin, unfortunately it would not reach to the ureter and renal pelvis to treat all the disease that he has. -I recommend systemic therapy with immunotherapy pembrolizumab 200 mg every 3 weeks, or 400 mg every 6 weeks if he tolerates well.  This has been studied in the keynote 057 trial, it demonstrated 39% complete response in 3 months in BCG unresponsive high risk nonmuscle invasive bladder cancer. -he is scheduled to start first cycle of Keytruda today, I again reviewed the potential side effect and what to watch, he voiced good understanding. -The patient, he had staging CT scan at [facility] urology 2 days ago, I will get a copy.    PLAN: -lab reviewed -Discuss Pembrolizumab and its side effects -Proceed with  D1 C1  Keytruda today -lab/flush,f/u  and Keytruda 02/02/2023  SUMMARY OF ONCOLOGIC HISTORY: Oncology History Overview Note   Cancer Staging  Bladder cancer [facility]) Staging form: Urinary Bladder, AJCC 8th Edition - Clinical stage from 05/14/2021: Stage 0a (cTa, cN0, cM0) - Signed by Truitt Merle, MD on 01/02/2023 Stage prefix: Initial diagnosis WHO/ISUP grade (low/high): High Grade Histologic grading system: 2 grade system - Pathologic stage from 02/22/2022: Stage I (pT1, pN0, cM0) - Signed by Truitt Merle, MD on 01/02/2023 WHO/ISUP grade (low/high): High Grade Histologic grading system: 2 grade system     Bladder cancer ([place])  05/14/2021 Cancer Staging   Staging form: Urinary Bladder, AJCC 8th Edition - Clinical stage from 05/14/2021: Stage 0a (cTa, cN0, cM0) - Signed by Truitt Merle, MD on 01/02/2023 Stage prefix: Initial diagnosis WHO/ISUP grade (low/high): High Grade Histologic grading system: 2 grade system   01/31/2022 Initial Diagnosis   Bladder cancer ([place])   02/22/2022 Cancer Staging   Staging form: Urinary Bladder, AJCC 8th Edition - Pathologic stage from 02/22/2022: Stage I (pT1, pN0, cM0) - Signed by Truitt Merle, MD on 01/02/2023 WHO/ISUP grade (low/high): High Grade Histologic grading system: 2 grade system   01/12/2023 -  Chemotherapy   Patient is on Treatment Plan : BLADDER Pembrolizumab (200) q21d           He was last seen by me on 01/02/2023 He presents to the clinic alone. Pt state he is doing well.     All other systems were reviewed with the patient and are negative.  MEDICAL HISTORY:  Past Medical History:  Diagnosis Date   Anticoagulant long-term use    eliquis--- managed by cardiology   Bladder  cancer [facility])    urologist--- dr Junious Silk;  overlapping   History of basal cell carcinoma ([place]) excision    History of COVID-19 05/2019   per pt mild symptoms that resolved   HLD (hyperlipidemia)    HTN (hypertension)    Hx of colonic polyps    Insulin dependent type 2 diabetes  [facility])    endocrinologist--- [name] --  (03-11-2022 per pt check blood sugar 1-2 times daily,  fasting sugar-- 118-120s)   Nocturia    Paroxysmal atrial fibrillation ([place]) 03/12/2019   cardiologist--- [name]   Ureterocele     SURGICAL HISTORY: Past Surgical History:  Procedure Laterality Date   BLEPHAROPLASTY Bilateral    per pt approx  2001;   upper eyelid's   COLONOSCOPY     last one approx 2020   CYSTOSCOPY WITH URETEROSCOPY AND STENT PLACEMENT Right 12/02/2022   Procedure: CYSTOSCOPY WITH RIGHT URETEROSCOPY, RIGHT DIAGNOSTIC URETEROSCOPY;  Surgeon: Festus Aloe, MD;  Location: [facility];  Service: Urology;  Laterality: Right;  90 MINS FOR CASE   PATELLAR TENDON REPAIR Left 04/16/2004   @[facility]    TRANSURETHRAL RESECTION OF BLADDER TUMOR N/A 06/18/2021   Procedure: TRANSURETHRAL RESECTION OF BLADDER TUMOR (TURBT) WITH RIGHT URETERAL STENT PLACEMENT, RIGHT URETEROSCOPY WITH DISTRACTION OF TUMOR, FULGURATION;  Surgeon: Festus Aloe, MD;  Location: [facility];  Service: Urology;  Laterality: N/A;   TRANSURETHRAL RESECTION OF BLADDER TUMOR N/A 03/15/2022   Procedure: TRANSURETHRAL RESECTION OF BLADDER TUMOR (TURBT) WITH BILATERAL URETEROSCOPY/URETHRAL/ DILATION/BILATERAL RETROGRADE PYELOGRAM/BIOPSY AND FULGURATION OF RIGHT URETERAL CANCER/RIGHT STENT PLACEMENT;  Surgeon: Festus Aloe, MD;  Location: [facility];  Service: Urology;  Laterality: N/A;   TRANSURETHRAL RESECTION OF BLADDER TUMOR WITH MITOMYCIN-C N/A 12/02/2022   Procedure: TRANSURETHRAL RESECTION OF BLADDER TUMOR;  Surgeon: Festus Aloe, MD;  Location: [facility];  Service: Urology;  Laterality: N/A;    I have reviewed the social history and family history with the patient and they are unchanged from previous note.  ALLERGIES:  has No Known Allergies.  MEDICATIONS:  Current Outpatient Medications  Medication Sig Dispense Refill   acetaminophen (TYLENOL) 500 MG tablet Take  1,000 mg by mouth every 6 (six) hours as needed.     apixaban (ELIQUIS) 5 MG TABS tablet Take 1 tablet (5 mg total) by mouth 2 (two) times daily. 60 tablet 5   atorvastatin (LIPITOR) 40 MG tablet TAKE ONE TABLET BY MOUTH ONCE DAILY 90 tablet 1   fluticasone (FLONASE) 50 MCG/ACT nasal spray Place 1 spray into both nostrils daily as needed for allergies or rhinitis.     glucose blood test strip 1 each by Other route daily. Use daily for glucose control - Dx 250.00 100 each 0   hydrochlorothiazide (MICROZIDE) 12.5 MG capsule Take 1 capsule (12.5 mg total) by mouth daily. 30 capsule 11   lisinopril (ZESTRIL) 40 MG tablet Take 1 tablet (40 mg total) by mouth daily. 90 tablet 3   metFORMIN (GLUCOPHAGE) 1000 MG tablet Take 1 tablet (1,000 mg total) by mouth 2 (two) times daily with a meal. 180 tablet 3   metoprolol succinate (TOPROL-XL) 25 MG 24 hr tablet TAKE ONE TABLET BY MOUTH ONCE DAILY 90 tablet 3   Multiple Vitamins-Minerals (EMERGEN-C VITAMIN C) PACK Take 1 packet by mouth daily as needed (immune support).     Multiple Vitamins-Minerals (ZINC PO) Take 1 tablet by mouth daily as needed (immune support).     propranolol (INDERAL) 10 MG tablet TAKE 1 TABLET BY  MOUTH 4 TIMES DAILY AS NEEDED FOR PALPITATIONS, IRREGULAR OR FAST HEART RATE 270 tablet 1   Semaglutide, 1 MG/DOSE, 4 MG/3ML SOPN Inject 1 mg as directed once a week. 3 mL 5   sildenafil (REVATIO) 20 MG tablet TAKE 1 TO 2 TABLETS 2 TO 3 HOURS BEFORE SEX 30 tablet 5   No current facility-administered medications for this visit.    PHYSICAL EXAMINATION: ECOG PERFORMANCE STATUS: 1 - Symptomatic but completely ambulatory  Vitals:   01/12/23 1306  BP: 133/73  Pulse: 69  Resp: 18  Temp: 97.9 F (36.6 C)  SpO2: 99%   Wt Readings from Last 3 Encounters:  01/12/23 211 lb 14.4 oz (96.1 kg)  01/02/23 210 lb 1.6 oz (95.3 kg)  12/02/22 200 lb 9.9 oz (91 kg)     GENERAL:alert, no distress and comfortable SKIN: skin color normal, no rashes  or significant lesions EYES: normal, Conjunctiva are pink and non-injected, sclera clear  NEURO: alert & oriented x 3 with fluent speech  LABORATORY DATA:  I have reviewed the data as listed    Latest Ref Rng & Units 01/12/2023   12:13 PM 11/25/2022    1:49 PM 03/15/2022    9:36 AM  CBC  WBC 4.0 - 10.5 K/uL 10.0  10.3    Hemoglobin 13.0 - 17.0 g/dL 15.8  16.9  17.0   Hematocrit 39.0 - 52.0 % 44.8  48.5  50.0   Platelets 150 - 400 K/uL 194  195          Latest Ref Rng & Units 01/12/2023   12:13 PM 11/25/2022    1:49 PM 03/15/2022    9:36 AM  CMP  Glucose 70 - 99 mg/dL 149  136  152   BUN 8 - 23 mg/dL 15  17  13    Creatinine 0.61 - 1.24 mg/dL 0.91  0.85  0.60   Sodium 135 - 145 mmol/L 137  136  138   Potassium 3.5 - 5.1 mmol/L 3.9  3.7  4.0   Chloride 98 - 111 mmol/L 103  102  98   CO2 22 - 32 mmol/L 27  25    Calcium 8.9 - 10.3 mg/dL 9.5  9.2    Total Protein 6.5 - 8.1 g/dL 7.2     Total Bilirubin 0.3 - 1.2 mg/dL 0.7     Alkaline Phos 38 - 126 U/L 68     AST 15 - 41 U/L 13     ALT 0 - 44 U/L 12         RADIOGRAPHIC STUDIES: I have personally reviewed the radiological images as listed and agreed with the findings in the report. No results found.    Orders Placed This Encounter  Procedures   CBC with Differential ([place] Only)    Standing Status:   Future    Standing Expiration Date:   02/23/2024   CMP ([place] only)    Standing Status:   Future    Standing Expiration Date:   02/23/2024   T4    Standing Status:   Future    Standing Expiration Date:   02/23/2024   TSH    Standing Status:   Future    Standing Expiration Date:   02/23/2024   CBC with Differential ([place] Only)    Standing Status:   Future    Standing Expiration Date:   03/15/2024   CMP ([place] only)    Standing Status:   Future  Standing Expiration Date:   03/15/2024   All questions were answered. The patient knows to call the clinic with any problems, questions or concerns. No  barriers to learning was detected. The total time spent in the appointment was 25 minutes.     Truitt Merle, MD 01/12/2023   Felicity Coyer, CMA, am acting as scribe for Truitt Merle, MD.   I have reviewed the above documentation for accuracy and completeness, and I agree with the above.

## 2023-01-12 NOTE — Assessment & Plan Note (Signed)
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 -He is clinically doing well, asymptomatic.  Surgical resection was discussed with patient, but he would like to try nonsurgical options. -I discussed intravesical therapy, such as gemcitabine or mitomycin, unfortunately it would not reach to the ureter and renal pelvis to treat all the disease that he has. -I recommend systemic therapy with immunotherapy pembrolizumab 200 mg every 3 weeks, or 400 mg every 6 weeks if he tolerates well.  This has been studied in the keynote 057 trial, it demonstrated 39% complete response in 3 months in BCG unresponsive high risk nonmuscle invasive bladder cancer. -he is scheduled to start first cycle of Keytruda today

## 2023-01-13 ENCOUNTER — Telehealth: Payer: Self-pay

## 2023-01-13 LAB — T4: T4, Total: 7.7 ug/dL (ref 4.5–12.0)

## 2023-01-13 NOTE — Telephone Encounter (Signed)
-----   Message from Ishmael Holter, RN sent at 01/12/2023  3:03 PM EDT ----- Regarding: Dr. Burr Medico 1st tx f/u call Dr. Burr Medico 1st tx F/U call. Nat Math - tolerated well

## 2023-01-13 NOTE — Telephone Encounter (Signed)
Robert [name] states that he is doing fine. He is eating, drinking, and urinating well. He knows to call the office at [phone] if he has any questions or concerns.

## 2023-01-13 NOTE — Progress Notes (Signed)
Faxed Dr. Ernestina Penna last office note to Dr. Festus Aloe with [facility] [phone]).  Fax confirmation received.

## 2023-01-13 NOTE — Progress Notes (Signed)
Requested pt's most recent CT Scan Report from [facility] [phone]).  Spoke with Larkin Ina in Medical Records Dept at [facility] for CT Scan Report.  Larkin Ina stated he will fax the report by the end of business today.

## 2023-01-18 ENCOUNTER — Ambulatory Visit: Admit: 2023-01-18 | Payer: Medicare HMO | Admitting: Orthopedic Surgery

## 2023-01-18 SURGERY — ARTHROPLASTY, HIP, TOTAL, ANTERIOR APPROACH
Anesthesia: Spinal | Site: Hip | Laterality: Right

## 2023-01-24 ENCOUNTER — Other Ambulatory Visit: Payer: Self-pay

## 2023-01-24 ENCOUNTER — Encounter: Payer: Self-pay | Admitting: Hematology

## 2023-02-02 ENCOUNTER — Inpatient Hospital Stay (HOSPITAL_BASED_OUTPATIENT_CLINIC_OR_DEPARTMENT_OTHER): Payer: Medicare HMO | Admitting: Hematology

## 2023-02-02 ENCOUNTER — Encounter: Payer: Self-pay | Admitting: Hematology

## 2023-02-02 ENCOUNTER — Inpatient Hospital Stay: Payer: Medicare HMO

## 2023-02-02 ENCOUNTER — Inpatient Hospital Stay: Payer: Medicare HMO | Attending: Hematology

## 2023-02-02 VITALS — BP 145/78 | HR 75 | Temp 98.2°F | Resp 18 | Ht 74.5 in | Wt 212.9 lb

## 2023-02-02 VITALS — BP 146/73 | HR 75 | Resp 18

## 2023-02-02 DIAGNOSIS — C675 Malignant neoplasm of bladder neck: Secondary | ICD-10-CM | POA: Diagnosis not present

## 2023-02-02 DIAGNOSIS — E119 Type 2 diabetes mellitus without complications: Secondary | ICD-10-CM | POA: Insufficient documentation

## 2023-02-02 DIAGNOSIS — C679 Malignant neoplasm of bladder, unspecified: Secondary | ICD-10-CM

## 2023-02-02 DIAGNOSIS — R5383 Other fatigue: Secondary | ICD-10-CM | POA: Insufficient documentation

## 2023-02-02 DIAGNOSIS — Z794 Long term (current) use of insulin: Secondary | ICD-10-CM | POA: Insufficient documentation

## 2023-02-02 DIAGNOSIS — Z5112 Encounter for antineoplastic immunotherapy: Secondary | ICD-10-CM | POA: Diagnosis not present

## 2023-02-02 LAB — CMP (CANCER CENTER ONLY)
ALT: 15 U/L (ref 0–44)
AST: 14 U/L — ABNORMAL LOW (ref 15–41)
Albumin: 4.2 g/dL (ref 3.5–5.0)
Alkaline Phosphatase: 85 U/L (ref 38–126)
Anion gap: 5 (ref 5–15)
BUN: 16 mg/dL (ref 8–23)
CO2: 29 mmol/L (ref 22–32)
Calcium: 9.5 mg/dL (ref 8.9–10.3)
Chloride: 103 mmol/L (ref 98–111)
Creatinine: 0.83 mg/dL (ref 0.61–1.24)
GFR, Estimated: >60 mL/min (ref >60)
Glucose, Bld: 162 mg/dL — ABNORMAL HIGH (ref 70–99)
Potassium: 4.3 mmol/L (ref 3.5–5.1)
Sodium: 137 mmol/L (ref 135–145)
Total Bilirubin: 0.6 mg/dL (ref 0.3–1.2)
Total Protein: 7.1 g/dL (ref 6.5–8.1)

## 2023-02-02 LAB — CBC WITH DIFFERENTIAL (CANCER CENTER ONLY)
Abs Immature Granulocytes: 0.15 10*3/uL — ABNORMAL HIGH (ref 0.00–0.07)
Basophils Absolute: 0.1 10*3/uL (ref 0.0–0.1)
Basophils Relative: 1 %
Eosinophils Absolute: 0.1 10*3/uL (ref 0.0–0.5)
Eosinophils Relative: 1 %
HCT: 45.6 % (ref 39.0–52.0)
Hemoglobin: 16 g/dL (ref 13.0–17.0)
Immature Granulocytes: 1 %
Lymphocytes Relative: 25 %
Lymphs Abs: 2.6 10*3/uL (ref 0.7–4.0)
MCH: 31.6 pg (ref 26.0–34.0)
MCHC: 35.1 g/dL (ref 30.0–36.0)
MCV: 89.9 fL (ref 80.0–100.0)
Monocytes Absolute: 0.8 10*3/uL (ref 0.1–1.0)
Monocytes Relative: 7 %
Neutro Abs: 6.8 10*3/uL (ref 1.7–7.7)
Neutrophils Relative %: 65 %
Platelet Count: 213 10*3/uL (ref 150–400)
RBC: 5.07 MIL/uL (ref 4.22–5.81)
RDW: 12.9 % (ref 11.5–15.5)
WBC Count: 10.6 10*3/uL — ABNORMAL HIGH (ref 4.0–10.5)
nRBC: 0 % (ref 0.0–0.2)

## 2023-02-02 MED ORDER — SODIUM CHLORIDE 0.9 % IV SOLN
200.0000 mg | Freq: Once | INTRAVENOUS | Status: AC
  Administered 2023-02-02: 200 mg via INTRAVENOUS
  Filled 2023-02-02: qty 200

## 2023-02-02 MED ORDER — SODIUM CHLORIDE 0.9 % IV SOLN: Freq: Once | INTRAVENOUS | Status: AC

## 2023-02-02 MED ORDER — HEPARIN SOD (PORK) LOCK FLUSH 100 UNIT/ML IV SOLN: 500.0000 [IU] | Freq: Once | INTRAVENOUS | Status: DC | PRN

## 2023-02-02 MED ORDER — SODIUM CHLORIDE 0.9% FLUSH: 10.0000 mL | INTRAVENOUS | Status: DC | PRN

## 2023-02-02 NOTE — Assessment & Plan Note (Signed)
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 -He is clinically doing well, asymptomatic.  Surgical resection was discussed with patient, but he would like to try nonsurgical options. -I discussed intravesical therapy, such as gemcitabine or mitomycin, unfortunately it would not reach to the ureter and renal pelvis to treat all the disease that he has. -I recommend systemic therapy with immunotherapy pembrolizumab 200 mg every 3 weeks, or 400 mg every 6 weeks if he tolerates well.  This has been studied in the keynote 057 trial, it demonstrated 39% complete response in 3 months in BCG unresponsive high risk nonmuscle invasive bladder cancer. -He started Keytruda on 01/12/23, tolerated first cycle well.

## 2023-02-02 NOTE — Progress Notes (Signed)
[facility]   Telephone:(336) [phone] Fax:(336) [phone]   Clinic Follow up Note   Patient Care Team: Plotnikov, [name], MD as PCP - General (Internal Medicine) Nahser, Deloris Ping, MD as PCP - Cardiology (Cardiology) Verner Chol, [facility] (Inactive) as Pharmacist (Pharmacist) Carlus Pavlov, MD as Consulting Physician (Internal Medicine) Jerilee Field, MD as Consulting Physician (Urology) Iva Boop, MD as Consulting Physician (Gastroenterology) Janet Berlin, MD as Consulting Physician (Ophthalmology) Malachy Mood, MD as Attending Physician (Hematology and Oncology)  Date of Service:  02/02/2023  CHIEF COMPLAINT: f/u of Recurrent bladder cancer     CURRENT THERAPY:  pembrolizumab every 3 weeks   ASSESSMENT:  Robert [name] is a 72 y.o. 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 -He is clinically doing well, asymptomatic.  Surgical resection was discussed with patient, but he would like to try nonsurgical options. -I discussed intravesical therapy, such as gemcitabine or mitomycin, unfortunately it would not reach to the ureter and renal pelvis to treat all the disease that he has. -I recommend systemic therapy with immunotherapy pembrolizumab 200 mg every 3 weeks, or 400 mg every 6 weeks if he tolerates well.  This has been studied in the keynote 057 trial, it demonstrated 39% complete response in 3 months in BCG unresponsive high risk nonmuscle invasive bladder cancer. -He started Keytruda on 01/12/23, tolerated first cycle well except a mild fatigue.  No other noticeable side effects.  We again reviewed potential AE's, and what to watch.  He voices good understanding. -He will follow-up with urologist Dr. Lennox Laity for repeated cystoscopy and evaluation of his response to  immunotherapy    PLAN: -lab reviewed -f/u with Urology in May  -proceed with second cycle Keytruda  -lab f/u and treatment in 3 weeks   SUMMARY OF ONCOLOGIC HISTORY: Oncology History Overview Note   Cancer Staging  Bladder cancer [facility]) Staging form: Urinary Bladder, AJCC 8th Edition - Clinical stage from 05/14/2021: Stage 0a (cTa, cN0, cM0) - Signed by Malachy Mood, MD on 01/02/2023 Stage prefix: Initial diagnosis WHO/ISUP grade (low/high): High Grade Histologic grading system: 2 grade system - Pathologic stage from 02/22/2022: Stage I (pT1, pN0, cM0) - Signed by Malachy Mood, MD on 01/02/2023 WHO/ISUP grade (low/high): High Grade Histologic grading system: 2 grade system     Bladder cancer  05/14/2021 Cancer Staging   Staging form: Urinary Bladder, AJCC 8th Edition - Clinical stage from 05/14/2021: Stage 0a (cTa, cN0, cM0) - Signed by Malachy Mood, MD on 01/02/2023 Stage prefix: Initial diagnosis WHO/ISUP grade (low/high): High Grade Histologic grading system: 2 grade system   01/31/2022 Initial Diagnosis   Bladder cancer ([facility])   02/22/2022 Cancer Staging   Staging form: Urinary Bladder, AJCC 8th Edition - Pathologic stage from 02/22/2022: Stage I (pT1, pN0, cM0) - Signed by Malachy Mood, MD on 01/02/2023 WHO/ISUP grade (low/high): High Grade Histologic grading system: 2 grade system   01/12/2023 -  Chemotherapy   Patient is on Treatment Plan : BLADDER Pembrolizumab (200) q21d        INTERVAL HISTORY:  Robert [name] is here for a follow up of Recurrent bladder cancer  .He was last seen by me on 01/12/2023. He presents to the clinic accompanied by son. Pt reports of feeling fatigue.Pt denied having any pain and nausea and discomfort.  All other systems were  reviewed with the patient and are negative.  MEDICAL HISTORY:  Past Medical History:  Diagnosis Date   Anticoagulant long-term use    eliquis--- managed by cardiology   Bladder cancer    urologist--- dr Mena Goeseskridge;   overlapping   History of basal cell carcinoma ([facility]) excision    History of COVID-19 05/2019   per pt mild symptoms that resolved   HLD (hyperlipidemia)    HTN (hypertension)    Hx of colonic polyps    Insulin dependent type 2 diabetes mellitus    endocrinologist--- dr Elvera Lennoxgherghe --  (03-11-2022 per pt check blood sugar 1-2 times daily,  fasting sugar-- 118-120s)   Nocturia    Paroxysmal atrial fibrillation 03/12/2019   cardiologist--- dr Melburn Poppernasher   Ureterocele     SURGICAL HISTORY: Past Surgical History:  Procedure Laterality Date   BLEPHAROPLASTY Bilateral    per pt approx  2001;   upper eyelid's   COLONOSCOPY     last one approx 2020   CYSTOSCOPY WITH URETEROSCOPY AND STENT PLACEMENT Right 12/02/2022   Procedure: CYSTOSCOPY WITH RIGHT URETEROSCOPY, RIGHT DIAGNOSTIC URETEROSCOPY;  Surgeon: Jerilee FieldEskridge, Matthew, MD;  Location: [facility];  Service: Urology;  Laterality: Right;  90 MINS FOR CASE   PATELLAR TENDON REPAIR Left 04/16/2004   @[facility]    TRANSURETHRAL RESECTION OF BLADDER TUMOR N/A 06/18/2021   Procedure: TRANSURETHRAL RESECTION OF BLADDER TUMOR (TURBT) WITH RIGHT URETERAL STENT PLACEMENT, RIGHT URETEROSCOPY WITH DISTRACTION OF TUMOR, FULGURATION;  Surgeon: Jerilee FieldEskridge, Matthew, MD;  [facility];  Service: Urology;  Laterality: N/A;   TRANSURETHRAL RESECTION OF BLADDER TUMOR N/A 03/15/2022   Procedure: TRANSURETHRAL RESECTION OF BLADDER TUMOR (TURBT) WITH BILATERAL URETEROSCOPY/URETHRAL/ DILATION/BILATERAL RETROGRADE PYELOGRAM/BIOPSY AND FULGURATION OF RIGHT URETERAL CANCER/RIGHT STENT PLACEMENT;  Surgeon: Jerilee FieldEskridge, Matthew, MD;  [facility];  Service: Urology;  Laterality: N/A;   TRANSURETHRAL RESECTION OF BLADDER TUMOR WITH MITOMYCIN-C N/A 12/02/2022   Procedure: TRANSURETHRAL RESECTION OF BLADDER TUMOR;  Surgeon: Jerilee FieldEskridge, Matthew, MD;  Location[facility];  Service: Urology;  Laterality: N/A;    I have reviewed the social history and family history  with the patient and they are unchanged from previous note.  ALLERGIES:  has No Known Allergies.  MEDICATIONS:  Current Outpatient Medications  Medication Sig Dispense Refill   acetaminophen (TYLENOL) 500 MG tablet Take 1,000 mg by mouth every 6 (six) hours as needed.     apixaban (ELIQUIS) 5 MG TABS tablet Take 1 tablet (5 mg total) by mouth 2 (two) times daily. 60 tablet 5   atorvastatin (LIPITOR) 40 MG tablet TAKE ONE TABLET BY MOUTH ONCE DAILY 90 tablet 1   fluticasone (FLONASE) 50 MCG/ACT nasal spray Place 1 spray into both nostrils daily as needed for allergies or rhinitis.     glucose blood test strip 1 each by Other route daily. Use daily for glucose control - Dx 250.00 100 each 0   hydrochlorothiazide (MICROZIDE) 12.5 MG capsule Take 1 capsule (12.5 mg total) by mouth daily. 30 capsule 11   lisinopril (ZESTRIL) 40 MG tablet Take 1 tablet (40 mg total) by mouth daily. 90 tablet 3   metFORMIN (GLUCOPHAGE) 1000 MG tablet Take 1 tablet (1,000 mg total) by mouth 2 (two) times daily with a meal. 180 tablet 3   metoprolol succinate (TOPROL-XL) 25 MG 24 hr tablet TAKE ONE TABLET BY MOUTH ONCE DAILY 90 tablet 3   Multiple Vitamins-Minerals (EMERGEN-C VITAMIN C) PACK Take 1 packet by mouth daily as needed (immune support).  Multiple Vitamins-Minerals (ZINC PO) Take 1 tablet by mouth daily as needed (immune support).     propranolol (INDERAL) 10 MG tablet TAKE 1 TABLET BY MOUTH 4 TIMES DAILY AS NEEDED FOR PALPITATIONS, IRREGULAR OR FAST HEART RATE 270 tablet 1   Semaglutide, 1 MG/DOSE, 4 MG/3ML SOPN Inject 1 mg as directed once a week. 3 mL 5   sildenafil (REVATIO) 20 MG tablet TAKE 1 TO 2 TABLETS 2 TO 3 HOURS BEFORE SEX 30 tablet 5   No current facility-administered medications for this visit.   Facility-Administered Medications Ordered in Other Visits  Medication Dose Route Frequency Provider Last Rate Last Admin   0.9 %  sodium chloride infusion   Intravenous Once Malachy Mood, MD        heparin lock flush 100 unit/mL  500 Units Intracatheter Once PRN Malachy Mood, MD       pembrolizumab [facility]) 200 mg in sodium chloride 0.9 % 50 mL chemo infusion  200 mg Intravenous Once Malachy Mood, MD       sodium chloride flush (NS) 0.9 % injection 10 mL  10 mL Intracatheter PRN Malachy Mood, MD        PHYSICAL EXAMINATION: ECOG PERFORMANCE STATUS: 1 - Symptomatic but completely ambulatory  Vitals:   02/02/23 1132  BP: (!) 145/78  Pulse: 75  Resp: 18  Temp: 98.2 F (36.8 C)  SpO2: 100%   Wt Readings from Last 3 Encounters:  02/02/23 212 lb 14.4 oz (96.6 kg)  01/12/23 211 lb 14.4 oz (96.1 kg)  01/02/23 210 lb 1.6 oz (95.3 kg)     GENERAL:alert, no distress and comfortable SKIN: skin color normal, no rashes or significant lesions EYES: normal, Conjunctiva are pink and non-injected, sclera clear  NEURO: alert & oriented x 3 with fluent speech LABORATORY DATA:  I have reviewed the data as listed    Latest Ref Rng & Units 02/02/2023   11:00 AM 01/12/2023   12:13 PM 11/25/2022    1:49 PM  CBC  WBC 4.0 - 10.5 K/uL 10.6  10.0  10.3   Hemoglobin 13.0 - 17.0 g/dL 38.9  37.3  42.8   Hematocrit 39.0 - 52.0 % 45.6  44.8  48.5   Platelets 150 - 400 K/uL 213  194  195         Latest Ref Rng & Units 02/02/2023   11:00 AM 01/12/2023   12:13 PM 11/25/2022    1:49 PM  CMP  Glucose 70 - 99 mg/dL 768  115  726   BUN 8 - 23 mg/dL 16  15  17    Creatinine 0.61 - 1.24 mg/dL 2.03  5.59  7.41   Sodium 135 - 145 mmol/L 137  137  136   Potassium 3.5 - 5.1 mmol/L 4.3  3.9  3.7   Chloride 98 - 111 mmol/L 103  103  102   CO2 22 - 32 mmol/L 29  27  25    Calcium 8.9 - 10.3 mg/dL 9.5  9.5  9.2   Total Protein 6.5 - 8.1 g/dL 7.1  7.2    Total Bilirubin 0.3 - 1.2 mg/dL 0.6  0.7    Alkaline Phos 38 - 126 U/L 85  68    AST 15 - 41 U/L 14  13    ALT 0 - 44 U/L 15  12        RADIOGRAPHIC STUDIES: I have personally reviewed the radiological images as listed and agreed with the findings in the  report. No results found.    No orders of the defined types were placed in this encounter.  All questions were answered. The patient knows to call the clinic with any problems, questions or concerns. No barriers to learning was detected. The total time spent in the appointment was 25 minutes.     Malachy Mood, MD 02/02/2023   Carolin Coy, CMA, am acting as scribe for Malachy Mood, MD.   I have reviewed the above documentation for accuracy and completeness, and I agree with the above.

## 2023-02-02 NOTE — Patient Instructions (Signed)
[facility]  Discharge Instructions: Thank you for choosing [facility] to provide your oncology and hematology care.   If you have a lab appointment with the Cancer Center, please go directly to the Cancer Center and check in at the registration area.   Wear comfortable clothing and clothing appropriate for easy access to any Portacath or PICC line.   We strive to give you quality time with your provider. You may need to reschedule your appointment if you arrive late (15 or more minutes).  Arriving late affects you and other patients whose appointments are after yours.  Also, if you miss three or more appointments without notifying the office, you may be dismissed from the clinic at the provider's discretion.      For prescription refill requests, have your pharmacy contact our office and allow 72 hours for refills to be completed.    Today you received the following chemotherapy and/or immunotherapy agents: Keytruda      To help prevent nausea and vomiting after your treatment, we encourage you to take your nausea medication as directed.  BELOW ARE SYMPTOMS THAT SHOULD BE REPORTED IMMEDIATELY: *FEVER GREATER THAN 100.4 F (38 C) OR HIGHER *CHILLS OR SWEATING *NAUSEA AND VOMITING THAT IS NOT CONTROLLED WITH YOUR NAUSEA MEDICATION *UNUSUAL SHORTNESS OF BREATH *UNUSUAL BRUISING OR BLEEDING *URINARY PROBLEMS (pain or burning when urinating, or frequent urination) *BOWEL PROBLEMS (unusual diarrhea, constipation, pain near the anus) TENDERNESS IN MOUTH AND THROAT WITH OR WITHOUT PRESENCE OF ULCERS (sore throat, sores in mouth, or a toothache) UNUSUAL RASH, SWELLING OR PAIN  UNUSUAL VAGINAL DISCHARGE OR ITCHING   Items with * indicate a potential emergency and should be followed up as soon as possible or go to the Emergency Department if any problems should occur.  Please show the CHEMOTHERAPY ALERT CARD or IMMUNOTHERAPY ALERT CARD at  check-in to the Emergency Department and triage nurse.  Should you have questions after your visit or need to cancel or reschedule your appointment, please contact [facility]  Dept: [phone]  and follow the prompts.  Office hours are 8:00 a.m. to 4:30 p.m. Monday - Friday. Please note that voicemails left after 4:00 p.m. may not be returned until the following business day.  We are closed weekends and major holidays. You have access to a nurse at all times for urgent questions. Please call the main number to the clinic Dept: [phone] and follow the prompts.   For any non-urgent questions, you may also contact your provider using MyChart. We now offer e-Visits for anyone 18 and older to request care online for non-urgent symptoms. For details visit [email or web address].   Also download the MyChart app! Go to the app store, search "MyChart", open the app, select , and log in with your MyChart username and password.   

## 2023-02-03 ENCOUNTER — Other Ambulatory Visit: Payer: Self-pay

## 2023-02-03 ENCOUNTER — Encounter: Payer: Self-pay | Admitting: Internal Medicine

## 2023-02-03 ENCOUNTER — Ambulatory Visit: Payer: Medicare HMO | Admitting: Internal Medicine

## 2023-02-03 VITALS — BP 124/72 | HR 77 | Ht 74.5 in | Wt 215.0 lb

## 2023-02-03 DIAGNOSIS — E663 Overweight: Secondary | ICD-10-CM

## 2023-02-03 DIAGNOSIS — E119 Type 2 diabetes mellitus without complications: Secondary | ICD-10-CM

## 2023-02-03 DIAGNOSIS — E785 Hyperlipidemia, unspecified: Secondary | ICD-10-CM | POA: Diagnosis not present

## 2023-02-03 LAB — POCT GLYCOSYLATED HEMOGLOBIN (HGB A1C): Hemoglobin A1C: 6.7 % — AB (ref 4.0–5.6)

## 2023-02-03 LAB — LIPID PANEL
Cholesterol: 124 mg/dL (ref 0–200)
HDL: 42.3 mg/dL (ref >39.00)
LDL Cholesterol: 49 mg/dL (ref 0–99)
NonHDL: 81.9
Total CHOL/HDL Ratio: 3
Triglycerides: 166 mg/dL — ABNORMAL HIGH (ref 0.0–149.0)
VLDL: 33.2 mg/dL (ref 0.0–40.0)

## 2023-02-03 MED ORDER — METFORMIN HCL ER 500 MG PO TB24: 2000.0000 mg | ORAL_TABLET | Freq: Every day | ORAL | 3 refills | Status: DC

## 2023-02-03 NOTE — Patient Instructions (Addendum)
Please switch to: - Metformin ER 2000 mg with dinner  Continue: - Glipizide ER 2.5 mg before breakfast and a crushed tablet before a large meal - Ozempic 1 mg weekly  Please return in 4 months with your sugar log.

## 2023-02-03 NOTE — Progress Notes (Signed)
Patient ID: Robert [name], male   DOB: August 05, 1951, 72 y.o.   MRN: [id number]   HPI: Robert [name] is a 72 y.o.-year-old male, returning for follow-up for  DM2, dx in 2000s, insulin-independent since 2018, uncontrolled, with complications (PN, ED).  Last visit 6 months ago.  Interim history: Last year, he was diagnosed with bladder cancer and had transurethral resection on 06/18/2021.  The cancer was high-grade, but noninvasive. He was on BCG bladder instillations - off and on Eliquis.  He had a cancer recurrence before our visit in 11/2021 >> no need for ChTx  Currently on Keytruda.  He had TURP 12/02/2022. No increased urination, blurry vision, nausea, chest pain.  He has hip pain (OA) in the hip - he prefers to stand, walks with a cane.  He needs a hip replacement.  Reviewed HbA1c levels: Lab Results  Component Value Date   HGBA1C 6.2 (H) 11/25/2022   HGBA1C 6.5 (A) 08/01/2022   HGBA1C 6.7 (A) 03/28/2022   Pt is on a regimen of: - Trulicity 1.5 >> 3 mg weekly >> Ozempic 1 mg weekly for the last 3 mo - Metformin 1000 mg 2x a day >> 2000 mg with dinner >> 1000 mg 2x a day - Glipizide 2.5 mg before a large dinner >> Glipizide ER 2.5 mg before breakfast and a crushed tablet before a large meal We stopped Novolog 70/30 10 units 2x a day in 11/2017, when starting Trulicity.  Pt checks his sugars 1-2 times a day: - am:   106-147, 155, 167 >> 119-130s, 150, 160 >> 111-163, 190 - 2h after b'fast:119-147, 157 > 126-155, 182 >> 136-171 >> n/c - before lunch: 115, 133 >> 135-142 >> 140-173, 239 >> n/c - 2h after lunch: 144-212, 237 >> 124-168 >> 160-179, 282 >> 172 - before dinner: 152 >> 120-143 >> 128, 132-159, 207 >> 147 - 2h after dinner:  138-201 >> 121-162, 188 >> n/c >> 110-123 - bedtime:   113-179 >> 121-138 >> 99-150s >> 103-135, 159 - nighttime: 129 >> 91 (wine) >> 118 >> n/c Lowest sugar was 28 right after starting insulin >> .[name] Kitchen.106 >> 119 >> 103; he has hypoglycemia  awareness in the 70s. Highest sugar was 237 >> 139 >> 280s - sandwich + icecream >> 193  Glucometer: ReliOn  Pt's meals are: - Breakfast: whole wheat toast + butte - Lunch: tuna salad or tuna sandwich ([employer] delivery), soft taco, meat + baked beans or potatoes - Dinner: [employer] delivery - Snacks: 1-0: nuts, bananas, other fruit He decreased his alcohol intake. He was swimming 5 times a week before the coronavirus pandemic, now exercising at home.  -No CKD; last BUN/creatinine:  Lab Results  Component Value Date   BUN 16 02/02/2023   BUN 15 01/12/2023   CREATININE 0.83 02/02/2023   CREATININE 0.91 01/12/2023  On lisinopril 40.  -+ HL; last set of lipids: Lab Results  Component Value Date   CHOL 100 04/20/2021   HDL 41.40 04/20/2021   LDLCALC 35 04/20/2021   LDLDIRECT 77.0 07/07/2015   TRIG 119.0 04/20/2021   CHOLHDL 2 04/20/2021  On Lipitor 40 and co-Q10.  - last eye exam was in 12/2021: No DR, + cataract.   - + numbness and tingling in his feet.  Last foot exam 03/28/2022.  He has a h/o arrhythmia (A fib) - wore an event monitor >> now on beta blocker. Cardiologist: Dr. Elease Hashimoto.  ROS: + See HPI  I reviewed  pt's medications, allergies, PMH, social hx, family hx, and changes were documented in the history of present illness. Otherwise, unchanged from my initial visit note.  Past Medical History:  Diagnosis Date   Anticoagulant long-term use    eliquis--- managed by cardiology   Bladder cancer    urologist--- dr Mena Goes;  overlapping   History of basal cell carcinoma (BCC) excision    History of COVID-19 05/2019   per pt mild symptoms that resolved   HLD (hyperlipidemia)    HTN (hypertension)    Hx of colonic polyps    Insulin dependent type 2 diabetes mellitus    endocrinologist--- dr Elvera Lennox --  (03-11-2022 per pt check blood sugar 1-2 times daily,  fasting sugar-- 118-120s)   Nocturia    Paroxysmal atrial fibrillation 03/12/2019   cardiologist--- dr  Melburn Popper   Ureterocele    Past Surgical History:  Procedure Laterality Date   BLEPHAROPLASTY Bilateral    per pt approx  2001;   upper eyelid's   COLONOSCOPY     last one approx 2020   CYSTOSCOPY WITH URETEROSCOPY AND STENT PLACEMENT Right 12/02/2022   Procedure: CYSTOSCOPY WITH RIGHT URETEROSCOPY, RIGHT DIAGNOSTIC URETEROSCOPY;  Surgeon: Jerilee Field, MD;  Location: [facility];  Service: Urology;  Laterality: Right;  90 MINS FOR CASE   PATELLAR TENDON REPAIR Left 04/16/2004      TRANSURETHRAL RESECTION OF BLADDER TUMOR N/A 06/18/2021   Procedure: TRANSURETHRAL RESECTION OF BLADDER TUMOR (TURBT) WITH RIGHT URETERAL STENT PLACEMENT, RIGHT URETEROSCOPY WITH DISTRACTION OF TUMOR, FULGURATION;  Surgeon: Jerilee Field, MD;  Location: [facility];  Service: Urology;  Laterality: N/A;   TRANSURETHRAL RESECTION OF BLADDER TUMOR N/A 03/15/2022   Procedure: TRANSURETHRAL RESECTION OF BLADDER TUMOR (TURBT) WITH BILATERAL URETEROSCOPY/URETHRAL/ DILATION/BILATERAL RETROGRADE PYELOGRAM/BIOPSY AND FULGURATION OF RIGHT URETERAL CANCER/RIGHT STENT PLACEMENT;  Surgeon: Jerilee Field, MD;  Location: [facility];  Service: Urology;  Laterality: N/A;   TRANSURETHRAL RESECTION OF BLADDER TUMOR WITH MITOMYCIN-C N/A 12/02/2022   Procedure: TRANSURETHRAL RESECTION OF BLADDER TUMOR;  Surgeon: Jerilee Field, MD;  Location: [facility];  Service: Urology;  Laterality: N/A;   Social History   Socioeconomic History   Marital status: Married    Spouse name: Not on file   Number of children: 2  Social Needs  Occupational History   Occupation: DVD's   Occupation: Occupational hygienist: Self Employed, retired  Tobacco Use   Smoking status: Former Smoker    Types: Cigars    Last attempt to quit: 02/22/2011   Smokeless tobacco: Never Used   Tobacco comment: 2 cigars monthly or less  Substance and Sexual Activity   Alcohol use: Yes     0.0 oz    Vodka, 2-3 drinks 3x a week    Drugs: No   Current Outpatient Medications on File Prior to Visit  Medication Sig Dispense Refill   acetaminophen (TYLENOL) 500 MG tablet Take 1,000 mg by mouth every 6 (six) hours as needed.     apixaban (ELIQUIS) 5 MG TABS tablet Take 1 tablet (5 mg total) by mouth 2 (two) times daily. 60 tablet 5   atorvastatin (LIPITOR) 40 MG tablet TAKE ONE TABLET BY MOUTH ONCE DAILY 90 tablet 1   fluticasone (FLONASE) 50 MCG/ACT nasal spray Place 1 spray into both nostrils daily as needed for allergies or rhinitis.     glucose blood test strip 1 each by Other route daily. Use daily for glucose control - Dx 250.00 100 each 0  hydrochlorothiazide (MICROZIDE) 12.5 MG capsule Take 1 capsule (12.5 mg total) by mouth daily. 30 capsule 11   lisinopril (ZESTRIL) 40 MG tablet Take 1 tablet (40 mg total) by mouth daily. 90 tablet 3   metFORMIN (GLUCOPHAGE) 1000 MG tablet Take 1 tablet (1,000 mg total) by mouth 2 (two) times daily with a meal. 180 tablet 3   metoprolol succinate (TOPROL-XL) 25 MG 24 hr tablet TAKE ONE TABLET BY MOUTH ONCE DAILY 90 tablet 3   Multiple Vitamins-Minerals (EMERGEN-C VITAMIN C) PACK Take 1 packet by mouth daily as needed (immune support).     Multiple Vitamins-Minerals (ZINC PO) Take 1 tablet by mouth daily as needed (immune support).     propranolol (INDERAL) 10 MG tablet TAKE 1 TABLET BY MOUTH 4 TIMES DAILY AS NEEDED FOR PALPITATIONS, IRREGULAR OR FAST HEART RATE 270 tablet 1   Semaglutide, 1 MG/DOSE, 4 MG/3ML SOPN Inject 1 mg as directed once a week. 3 mL 5   sildenafil (REVATIO) 20 MG tablet TAKE 1 TO 2 TABLETS 2 TO 3 HOURS BEFORE SEX 30 tablet 5   No current facility-administered medications on file prior to visit.   No Known Allergies Family History  Problem Relation Age of Onset   Pancreatic cancer Mother 85   Cancer Paternal Uncle        unknown type cancer   Aortic aneurysm Other        family history   Cancer Other        Breast (1st degree relative)   Colon cancer  Neg Hx    Rectal cancer Neg Hx    Stomach cancer Neg Hx    Esophageal cancer Neg Hx    PE: BP 124/72   Pulse 77   Ht 6' 2.5" (1.892 m)   Wt 215 lb (97.5 kg)   SpO2 98%   BMI 27.24 kg/m  Wt Readings from Last 3 Encounters:  02/03/23 215 lb (97.5 kg)  02/02/23 212 lb 14.4 oz (96.6 kg)  01/12/23 211 lb 14.4 oz (96.1 kg)   Constitutional: overweight, in NAD Eyes:  EOMI, no exophthalmos ENT: no neck masses, no cervical lymphadenopathy Cardiovascular: RRR, No MRG Respiratory: CTA B Musculoskeletal: no deformities Skin:no rashes Neurological: no tremor with outstretched hands  ASSESSMENT: 1. DM2, insulin-independent, uncontrolled, with complications - PN - ED  2. HL  3.  Overweight  PLAN:  1. Patient with longstanding, previously uncontrolled type 2 diabetes, on oral antidiabetic regimen with metformin, sulfonylurea, and GLP-1 receptor agonist, with improved control.  HbA1c was 6.5%, but he had another HbA1c obtained 2 months ago, which was even better, at 6.2%. -At last visit, he had some high blood sugars after dietary indiscretions and we discussed about taking a crash glipizide before a larger meal.  Otherwise, we did not change his regimen. -At today's visit, he tells me he is starting [facility] and was advised that this may increase his blood sugars.  We do see higher blood sugars in the morning, but this coincides with him splitting the metformin dose from taking 2000 mg at night to taking 1000 mg twice a day due to diarrhea.  My suggestion was to try to move the entire metformin dose at night again but to switch to the extended release formulation, which is better tolerated.  Otherwise, we can continue with the same regimen.  He was able to switch from Trulicity to Ozempic since last visit.  He tolerated this well. - I suggested to:  Patient Instructions  Please  switch to: - Metformin ER 2000 mg with dinner  Continue: - Glipizide ER 2.5 mg before breakfast and a crushed  tablet before a large meal - Ozempic 1 mg weekly  Please return in 4 months with your sugar log.   - we checked his HbA1c: 6.7% (highest) - advised to check sugars at different times of the day - 1x a day, rotating check times - advised for yearly eye exams >> he is UTD - return to clinic in 4 months  2.  HL -Reviewed latest lipid panel from 03/2021: Fractions at goal: Lab Results  Component Value Date   CHOL 100 04/20/2021   HDL 41.40 04/20/2021   LDLCALC 35 04/20/2021   LDLDIRECT 77.0 07/07/2015   TRIG 119.0 04/20/2021   CHOLHDL 2 04/20/2021  -He is on Lipitor 40 mg daily and co-Q10-no side effects -he is due for another lipid panel-will check this today  3.  Overweight -He lost approximately 26 pounds immediately after starting GLP-1 receptor agonist and after switching to a more plant-based diet -At last visit, he lost 1 pound, but previously gained 6 -He gained 8 pounds since last visit  Component     Latest Ref Rng 02/03/2023  Hemoglobin A1C     4.0 - 5.6 % 6.7 !   Cholesterol     0 - 200 mg/dL 782   Triglycerides     0.0 - 149.0 mg/dL 956.2 (H)   HDL Cholesterol     >39.00 mg/dL 13.08   VLDL     0.0 - 40.0 mg/dL 65.7   LDL (calc)     0 - 99 mg/dL 49   Total CHOL/HDL Ratio 3   NonHDL 81.90   LDL at goal, triglycerides slightly elevated, but this was not a fasting sample.  Carlus Pavlov, MD PhD [facility]

## 2023-02-05 ENCOUNTER — Other Ambulatory Visit: Payer: Self-pay

## 2023-02-07 ENCOUNTER — Encounter: Payer: Self-pay | Admitting: Internal Medicine

## 2023-02-09 ENCOUNTER — Telehealth: Payer: Self-pay | Admitting: Internal Medicine

## 2023-02-09 ENCOUNTER — Other Ambulatory Visit: Payer: Self-pay | Admitting: Internal Medicine

## 2023-02-09 ENCOUNTER — Telehealth: Payer: Self-pay | Admitting: *Deleted

## 2023-02-09 NOTE — Telephone Encounter (Signed)
We have received Surgical Clearance PW in the faxes, to be filled out by provider. Patient requested to send it via Fax within 7-days. Document is located in providers tray at front office.Please advise at Mobile [phone] (mobile)   Please fax to: [phone]

## 2023-02-09 NOTE — Telephone Encounter (Signed)
Place form in MD purple folder to complete../lmb 

## 2023-02-09 NOTE — Telephone Encounter (Signed)
   Pre-operative Risk Assessment    Patient Name: Robert [name]  DOB: 1951-07-02 MRN: [id number]      Request for Surgical Clearance    Procedure:   RIGHT TOTAL HIP ARTHROPLASTY  Date of Surgery:  Clearance TBD                               Surgeon:  DR. Samson Frederic Surgeon's Group or Practice Name:  [facility] Phone number:  [phone] ATTN: KERRI MAZE Fax number:  [phone]   Type of Clearance Requested:   - Medical  - Pharmacy:  Hold Apixaban (Eliquis)     Type of Anesthesia:  Spinal   Additional requests/questions:    [name]   02/09/2023, 4:38 PM

## 2023-02-10 ENCOUNTER — Other Ambulatory Visit: Payer: Self-pay | Admitting: Internal Medicine

## 2023-02-10 MED ORDER — TRAMADOL HCL 50 MG PO TABS: 50.0000 mg | ORAL_TABLET | Freq: Four times a day (QID) | ORAL | 0 refills | Status: DC | PRN

## 2023-02-10 NOTE — Telephone Encounter (Signed)
Pt was made aware to schedule an office visit before forms can be completed. Schedule an office visit in May.

## 2023-02-10 NOTE — Telephone Encounter (Signed)
The patient needs an office visit.  He has not been seen for about a year.  Thank you

## 2023-02-10 NOTE — Telephone Encounter (Signed)
   Primary Cardiologist: [name], MD  Chart reviewed as part of pre-operative protocol coverage. Given past medical history and time since last visit, based on ACC/AHA guidelines, Robert [name] would be at acceptable risk for the planned procedure without further cardiovascular testing.   Patient was advised that if he develops new symptoms prior to surgery to contact our office to arrange a follow-up appointment.  He verbalized understanding.  Per office protocol, patient can hold Eliquis for 3 days prior to procedure.   I will route this recommendation to the requesting party via Epic fax function and remove from pre-op pool.  Please call with questions.  Levi Aland, NP-C  02/10/2023, 3:48 PM [address], [address] Office [phone] Fax [phone]

## 2023-02-10 NOTE — Telephone Encounter (Signed)
Called pt gave MD response. Made appt for 02/27/23 @ 3"00.[name] KitchenRaechel Chute

## 2023-02-10 NOTE — Telephone Encounter (Signed)
Patient with diagnosis of A Fib on Eliquis for anticoagulation.    Procedure:  RIGHT TOTAL HIP ARTHROPLASTY  Date of procedure: TBD   CHA2DS2-VASc Score = 3  This indicates a 3.2% annual risk of stroke. The patient's score is based upon: CHF History: 0 HTN History: 1 Diabetes History: 1 Stroke History: 0 Vascular Disease History: 0 Age Score: 1 Gender Score: 0    CrCl 111 ml?min Platelet count 213K  Per office protocol, patient can hold Eliquis for 3 days prior to procedure.    **This guidance is not considered finalized until pre-operative APP has relayed final recommendations.**

## 2023-02-13 ENCOUNTER — Telehealth: Payer: Self-pay

## 2023-02-13 ENCOUNTER — Other Ambulatory Visit: Payer: Self-pay

## 2023-02-13 DIAGNOSIS — C679 Malignant neoplasm of bladder, unspecified: Secondary | ICD-10-CM

## 2023-02-13 DIAGNOSIS — N390 Urinary tract infection, site not specified: Secondary | ICD-10-CM

## 2023-02-13 NOTE — Telephone Encounter (Signed)
Pt called stating he's having pain/burning when urinating x 1.5 months.  Pt stated he had a procedure done at his urologist office and had a UTI shortly afterwards.  Pt stated the UTI cleared up but came back after starting immunotherapy.  Pt stated he's taking AZO x 2 days but has not seen any changes in his symptoms.  Pt denied drinking cranberry juice.  Pt c/o urinary frequency and urgency incontinence.  Recommended that pt drink cranberry juice along with continuing the AZO.  Also, got pt scheduled on 02/16/2023 to come in for labs (02/16/2023 was the only time the pt stated he could come in for labs). Informed pt that once the labs results, Dr. Mosetta Putt will give this RN directions on what to do next.

## 2023-02-15 ENCOUNTER — Telehealth: Payer: Self-pay

## 2023-02-15 NOTE — Telephone Encounter (Signed)
Called patient to relay message from Santiago Glad NP that he needs to follow up with his Urologist Dr. Mena Goes for the issues that he is having. He stated he has an appointment on June 5 was that soon enough. I told him since he is having issues at this time he needs to see them sooner.

## 2023-02-16 ENCOUNTER — Inpatient Hospital Stay: Payer: Medicare HMO

## 2023-02-16 DIAGNOSIS — C675 Malignant neoplasm of bladder neck: Secondary | ICD-10-CM | POA: Diagnosis not present

## 2023-02-16 DIAGNOSIS — E119 Type 2 diabetes mellitus without complications: Secondary | ICD-10-CM | POA: Diagnosis not present

## 2023-02-16 DIAGNOSIS — R5383 Other fatigue: Secondary | ICD-10-CM | POA: Diagnosis not present

## 2023-02-16 DIAGNOSIS — Z5112 Encounter for antineoplastic immunotherapy: Secondary | ICD-10-CM | POA: Diagnosis not present

## 2023-02-16 DIAGNOSIS — C679 Malignant neoplasm of bladder, unspecified: Secondary | ICD-10-CM

## 2023-02-16 DIAGNOSIS — N390 Urinary tract infection, site not specified: Secondary | ICD-10-CM

## 2023-02-16 DIAGNOSIS — Z794 Long term (current) use of insulin: Secondary | ICD-10-CM | POA: Diagnosis not present

## 2023-02-16 LAB — URINALYSIS, COMPLETE (UACMP) WITH MICROSCOPIC
Bilirubin Urine: NEGATIVE
Glucose, UA: NEGATIVE mg/dL
Ketones, ur: NEGATIVE mg/dL
Leukocytes,Ua: NEGATIVE
Nitrite: NEGATIVE
Protein, ur: 100 mg/dL — AB
RBC / HPF: >50 RBC/hpf (ref 0–5)
Specific Gravity, Urine: 1.016 (ref 1.005–1.030)
pH: 6 (ref 5.0–8.0)

## 2023-02-16 LAB — CMP (CANCER CENTER ONLY)
ALT: 10 U/L (ref 0–44)
AST: 13 U/L — ABNORMAL LOW (ref 15–41)
Albumin: 4.2 g/dL (ref 3.5–5.0)
Alkaline Phosphatase: 73 U/L (ref 38–126)
Anion gap: 6 (ref 5–15)
BUN: 13 mg/dL (ref 8–23)
CO2: 28 mmol/L (ref 22–32)
Calcium: 9.4 mg/dL (ref 8.9–10.3)
Chloride: 102 mmol/L (ref 98–111)
Creatinine: 0.89 mg/dL (ref 0.61–1.24)
GFR, Estimated: >60 mL/min (ref >60)
Glucose, Bld: 201 mg/dL — ABNORMAL HIGH (ref 70–99)
Potassium: 4.2 mmol/L (ref 3.5–5.1)
Sodium: 136 mmol/L (ref 135–145)
Total Bilirubin: 0.6 mg/dL (ref 0.3–1.2)
Total Protein: 6.8 g/dL (ref 6.5–8.1)

## 2023-02-16 LAB — CBC WITH DIFFERENTIAL (CANCER CENTER ONLY)
Abs Immature Granulocytes: 0.11 10*3/uL — ABNORMAL HIGH (ref 0.00–0.07)
Basophils Absolute: 0.1 10*3/uL (ref 0.0–0.1)
Basophils Relative: 1 %
Eosinophils Absolute: 0.1 10*3/uL (ref 0.0–0.5)
Eosinophils Relative: 1 %
HCT: 45.1 % (ref 39.0–52.0)
Hemoglobin: 16 g/dL (ref 13.0–17.0)
Immature Granulocytes: 1 %
Lymphocytes Relative: 22 %
Lymphs Abs: 2.5 10*3/uL (ref 0.7–4.0)
MCH: 32.3 pg (ref 26.0–34.0)
MCHC: 35.5 g/dL (ref 30.0–36.0)
MCV: 91.1 fL (ref 80.0–100.0)
Monocytes Absolute: 0.9 10*3/uL (ref 0.1–1.0)
Monocytes Relative: 8 %
Neutro Abs: 8.1 10*3/uL — ABNORMAL HIGH (ref 1.7–7.7)
Neutrophils Relative %: 67 %
Platelet Count: 177 10*3/uL (ref 150–400)
RBC: 4.95 MIL/uL (ref 4.22–5.81)
RDW: 13.1 % (ref 11.5–15.5)
WBC Count: 11.7 10*3/uL — ABNORMAL HIGH (ref 4.0–10.5)
nRBC: 0 % (ref 0.0–0.2)

## 2023-02-17 LAB — URINE CULTURE: Culture: NO GROWTH

## 2023-02-20 ENCOUNTER — Telehealth: Payer: Self-pay

## 2023-02-20 NOTE — Telephone Encounter (Addendum)
Called patient and relayed message below as per Dr. Mosetta Putt, patient had full understanding.  ----- Message from Malachy Mood, MD sent at 02/18/2023  9:54 AM EDT ----- Please let pt know his urine culture was negative.   Malachy Mood

## 2023-02-22 DIAGNOSIS — C678 Malignant neoplasm of overlapping sites of bladder: Secondary | ICD-10-CM | POA: Diagnosis not present

## 2023-02-22 DIAGNOSIS — R3915 Urgency of urination: Secondary | ICD-10-CM | POA: Diagnosis not present

## 2023-02-22 DIAGNOSIS — C661 Malignant neoplasm of right ureter: Secondary | ICD-10-CM | POA: Diagnosis not present

## 2023-02-23 ENCOUNTER — Inpatient Hospital Stay (HOSPITAL_BASED_OUTPATIENT_CLINIC_OR_DEPARTMENT_OTHER): Payer: Medicare HMO | Admitting: Hematology

## 2023-02-23 ENCOUNTER — Inpatient Hospital Stay: Payer: Medicare HMO

## 2023-02-23 ENCOUNTER — Encounter: Payer: Self-pay | Admitting: Hematology

## 2023-02-23 ENCOUNTER — Inpatient Hospital Stay: Payer: Medicare HMO | Attending: Hematology

## 2023-02-23 VITALS — BP 138/82 | HR 77 | Temp 98.1°F | Resp 18 | Ht 74.5 in | Wt 212.4 lb

## 2023-02-23 DIAGNOSIS — C679 Malignant neoplasm of bladder, unspecified: Secondary | ICD-10-CM | POA: Diagnosis not present

## 2023-02-23 DIAGNOSIS — R5383 Other fatigue: Secondary | ICD-10-CM | POA: Insufficient documentation

## 2023-02-23 DIAGNOSIS — Z79620 Long term (current) use of immunosuppressive biologic: Secondary | ICD-10-CM | POA: Diagnosis not present

## 2023-02-23 DIAGNOSIS — Z5112 Encounter for antineoplastic immunotherapy: Secondary | ICD-10-CM | POA: Insufficient documentation

## 2023-02-23 LAB — CMP (CANCER CENTER ONLY)
ALT: 11 U/L (ref 0–44)
AST: 13 U/L — ABNORMAL LOW (ref 15–41)
Albumin: 4.2 g/dL (ref 3.5–5.0)
Alkaline Phosphatase: 66 U/L (ref 38–126)
Anion gap: 5 (ref 5–15)
BUN: 15 mg/dL (ref 8–23)
CO2: 29 mmol/L (ref 22–32)
Calcium: 9.3 mg/dL (ref 8.9–10.3)
Chloride: 102 mmol/L (ref 98–111)
Creatinine: 0.77 mg/dL (ref 0.61–1.24)
GFR, Estimated: >60 mL/min (ref >60)
Glucose, Bld: 207 mg/dL — ABNORMAL HIGH (ref 70–99)
Potassium: 4.5 mmol/L (ref 3.5–5.1)
Sodium: 136 mmol/L (ref 135–145)
Total Bilirubin: 0.7 mg/dL (ref 0.3–1.2)
Total Protein: 6.9 g/dL (ref 6.5–8.1)

## 2023-02-23 LAB — CBC WITH DIFFERENTIAL (CANCER CENTER ONLY)
Abs Immature Granulocytes: 0.11 10*3/uL — ABNORMAL HIGH (ref 0.00–0.07)
Basophils Absolute: 0.1 10*3/uL (ref 0.0–0.1)
Basophils Relative: 1 %
Eosinophils Absolute: 0.1 10*3/uL (ref 0.0–0.5)
Eosinophils Relative: 1 %
HCT: 45 % (ref 39.0–52.0)
Hemoglobin: 15.9 g/dL (ref 13.0–17.0)
Immature Granulocytes: 1 %
Lymphocytes Relative: 22 %
Lymphs Abs: 2.1 10*3/uL (ref 0.7–4.0)
MCH: 32.1 pg (ref 26.0–34.0)
MCHC: 35.3 g/dL (ref 30.0–36.0)
MCV: 90.9 fL (ref 80.0–100.0)
Monocytes Absolute: 0.9 10*3/uL (ref 0.1–1.0)
Monocytes Relative: 9 %
Neutro Abs: 6.5 10*3/uL (ref 1.7–7.7)
Neutrophils Relative %: 66 %
Platelet Count: 179 10*3/uL (ref 150–400)
RBC: 4.95 MIL/uL (ref 4.22–5.81)
RDW: 13 % (ref 11.5–15.5)
WBC Count: 9.7 10*3/uL (ref 4.0–10.5)
nRBC: 0 % (ref 0.0–0.2)

## 2023-02-23 MED ORDER — SODIUM CHLORIDE 0.9 % IV SOLN: Freq: Once | INTRAVENOUS | Status: AC

## 2023-02-23 MED ORDER — SODIUM CHLORIDE 0.9 % IV SOLN
200.0000 mg | Freq: Once | INTRAVENOUS | Status: AC
  Administered 2023-02-23: 200 mg via INTRAVENOUS
  Filled 2023-02-23: qty 8

## 2023-02-23 NOTE — Progress Notes (Addendum)
[facility]   [phone] [phone]   Clinic Follow up Note   Patient Care Team: Plotnikov, [name], MD as PCP - General (Internal Medicine) Nahser, Deloris Ping, MD as PCP - Cardiology (Cardiology) Verner Chol, [facility] (Inactive) as Pharmacist (Pharmacist) Carlus Pavlov, MD as Consulting Physician (Internal Medicine) Jerilee Field, MD as Consulting Physician (Urology) Iva Boop, MD as Consulting Physician (Gastroenterology) Janet Berlin, MD as Consulting Physician (Ophthalmology) Malachy Mood, MD as Attending Physician (Hematology and Oncology)  Date of Service:  02/23/2023  CHIEF COMPLAINT: f/u of  Recurrent bladder cancer   CURRENT THERAPY:   pembrolizumab every 3 weeks   ASSESSMENT:  Robert [name] is a 72 y.o. 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 -He is clinically doing well, asymptomatic.  Surgical resection was discussed with patient, but he would like to try nonsurgical options. -I discussed intravesical therapy, such as gemcitabine or mitomycin, unfortunately it would not reach to the ureter and renal pelvis to treat all the disease that he has. -I recommend systemic therapy with immunotherapy pembrolizumab 200 mg every 3 weeks, or 400 mg every 6 weeks if he tolerates well.  This has been studied in the keynote 057 trial, it demonstrated 39% complete response in 3 months in BCG unresponsive high risk nonmuscle invasive bladder cancer. -He started Keytruda on 01/12/23, tolerated first cycle well except a mild fatigue.  No other noticeable side effects.  We again reviewed potential AE's, and what to watch.  He voices good understanding. -He will follow-up with urologist Dr. Lennox Laity for repeated cystoscopy and evaluation of his response to immunotherapy -Per  patient, she underwent cystoscopy last months and in no visible residual tumor in bladder. -He is tolerating Keytruda very well, no noticeable side effect.  Will continue.  We discussed option to change treatment to every 6 weeks if he tolerates well.   PLAN: -Lab reviewed, adequate for treatment, will proceed cycle 3 Keytruda today -Lab, follow-up and cycle 4 Keytruda in 3 weeks   SUMMARY OF ONCOLOGIC HISTORY: Oncology History Overview Note   Cancer Staging  Bladder cancer [facility]) Staging form: Urinary Bladder, AJCC 8th Edition - Clinical stage from 05/14/2021: Stage 0a (cTa, cN0, cM0) - Signed by Malachy Mood, MD on 01/02/2023 Stage prefix: Initial diagnosis WHO/ISUP grade (low/high): High Grade Histologic grading system: 2 grade system - Pathologic stage from 02/22/2022: Stage I (pT1, pN0, cM0) - Signed by Malachy Mood, MD on 01/02/2023 WHO/ISUP grade (low/high): High Grade Histologic grading system: 2 grade system     Bladder cancer (HCC)  05/14/2021 Cancer Staging   Staging form: Urinary Bladder, AJCC 8th Edition - Clinical stage from 05/14/2021: Stage 0a (cTa, cN0, cM0) - Signed by Malachy Mood, MD on 01/02/2023 Stage prefix: Initial diagnosis WHO/ISUP grade (low/high): High Grade Histologic grading system: 2 grade system   01/31/2022 Initial Diagnosis   Bladder cancer (HCC)   02/22/2022 Cancer Staging   Staging form: Urinary Bladder, AJCC 8th Edition - Pathologic stage from 02/22/2022: Stage I (pT1, pN0, cM0) - Signed by Malachy Mood, MD on 01/02/2023 WHO/ISUP grade (low/high): High Grade Histologic grading system: 2 grade system   01/12/2023 -  Chemotherapy   Patient is on Treatment Plan : BLADDER Pembrolizumab (200) q21d        INTERVAL HISTORY:  Robert [name] is here for  a follow up of Recurrent bladder cancer . He was last seen by me on 02/02/2023. He mepresents to the clinic in a wheelchair with his son.  He is clinically doing well, no noticeable side effect from Anita.  He  had a mild hematuria a few weeks ago, and was seen by his urologist and underwent repeated cystoscopy, per patient it was negative for residual cancer.  He wants to do hip surgery in July if possible.    All other systems were reviewed with the patient and are negative.  MEDICAL HISTORY:  Past Medical History:  Diagnosis Date   Anticoagulant long-term use    eliquis--- managed by cardiology   Bladder cancer [facility])    urologist--- dr Mena Goes;  overlapping   History of basal cell carcinoma (BCC) excision    History of COVID-19 05/2019   per pt mild symptoms that resolved   HLD (hyperlipidemia)    HTN (hypertension)    Hx of colonic polyps    Insulin dependent type 2 diabetes mellitus [facility])    endocrinologist--- dr Elvera Lennox --  (03-11-2022 per pt check blood sugar 1-2 times daily,  fasting sugar-- 118-120s)   Nocturia    Paroxysmal atrial fibrillation (HCC) 03/12/2019   cardiologist--- dr Melburn Popper   Ureterocele     SURGICAL HISTORY: Past Surgical History:  Procedure Laterality Date   BLEPHAROPLASTY Bilateral    per pt approx  2001;   upper eyelid's   COLONOSCOPY     last one approx 2020   CYSTOSCOPY WITH URETEROSCOPY AND STENT PLACEMENT Right 12/02/2022   Procedure: CYSTOSCOPY WITH RIGHT URETEROSCOPY, RIGHT DIAGNOSTIC URETEROSCOPY;  Surgeon: Jerilee Field, MD;  Location: [facility];  Service: Urology;  Laterality: Right;  90 MINS FOR CASE   PATELLAR TENDON REPAIR Left 04/16/2004   @[facility]    TRANSURETHRAL RESECTION OF BLADDER TUMOR N/A 06/18/2021   Procedure: TRANSURETHRAL RESECTION OF BLADDER TUMOR (TURBT) WITH RIGHT URETERAL STENT PLACEMENT, RIGHT URETEROSCOPY WITH DISTRACTION OF TUMOR, FULGURATION;  Surgeon: Jerilee Field, MD;  Location: [facility];  Service: Urology;  Laterality: N/A;   TRANSURETHRAL RESECTION OF BLADDER TUMOR N/A 03/15/2022   Procedure: TRANSURETHRAL RESECTION OF BLADDER TUMOR (TURBT) WITH BILATERAL URETEROSCOPY/URETHRAL/ DILATION/BILATERAL  RETROGRADE PYELOGRAM/BIOPSY AND FULGURATION OF RIGHT URETERAL CANCER/RIGHT STENT PLACEMENT;  Surgeon: Jerilee Field, MD;  Location: [facility];  Service: Urology;  Laterality: N/A;   TRANSURETHRAL RESECTION OF BLADDER TUMOR WITH MITOMYCIN-C N/A 12/02/2022   Procedure: TRANSURETHRAL RESECTION OF BLADDER TUMOR;  Surgeon: Jerilee Field, MD;  Location: [facility];  Service: Urology;  Laterality: N/A;    I have reviewed the social history and family history with the patient and they are unchanged from previous note.  ALLERGIES:  has No Known Allergies.  MEDICATIONS:  Current Outpatient Medications  Medication Sig Dispense Refill   traMADol (ULTRAM) 50 MG tablet Take 1 tablet (50 mg total) by mouth every 6 (six) hours as needed for severe pain. 20 tablet 0   acetaminophen (TYLENOL) 500 MG tablet Take 1,000 mg by mouth every 6 (six) hours as needed.     apixaban (ELIQUIS) 5 MG TABS tablet Take 1 tablet (5 mg total) by mouth 2 (two) times daily. 60 tablet 5   atorvastatin (LIPITOR) 40 MG tablet TAKE ONE TABLET BY MOUTH ONCE DAILY 90 tablet 1   fluticasone (FLONASE) 50 MCG/ACT nasal spray Place 1 spray into both nostrils daily as needed for allergies or rhinitis.     glipiZIDE (GLUCOTROL) 5 MG tablet TAKE 1/2 TABLET BY  MOUTH EVERY EVENING 45 tablet 3   glucose blood test strip 1 each by Other route daily. Use daily for glucose control - Dx 250.00 100 each 0   hydrochlorothiazide (MICROZIDE) 12.5 MG capsule Take 1 capsule (12.5 mg total) by mouth daily. 30 capsule 11   lisinopril (ZESTRIL) 40 MG tablet Take 1 tablet (40 mg total) by mouth daily. 90 tablet 3   metFORMIN (GLUCOPHAGE-XR) 500 MG 24 hr tablet Take 4 tablets (2,000 mg total) by mouth daily with supper. 360 tablet 3   metoprolol succinate (TOPROL-XL) 25 MG 24 hr tablet TAKE ONE TABLET BY MOUTH ONCE DAILY 90 tablet 3   Multiple Vitamins-Minerals (EMERGEN-C VITAMIN C) PACK Take 1 packet by mouth daily as needed (immune  support).     Multiple Vitamins-Minerals (ZINC PO) Take 1 tablet by mouth daily as needed (immune support).     pembrolizumab (KEYTRUDA) 100 MG/4ML SOLN Inject 2 mg/kg into the vein.     propranolol (INDERAL) 10 MG tablet TAKE 1 TABLET BY MOUTH 4 TIMES DAILY AS NEEDED FOR PALPITATIONS, IRREGULAR OR FAST HEART RATE 270 tablet 1   Semaglutide, 1 MG/DOSE, 4 MG/3ML SOPN Inject 1 mg as directed once a week. 3 mL 5   sildenafil (REVATIO) 20 MG tablet TAKE 1 TO 2 TABLETS 2 TO 3 HOURS BEFORE SEX 30 tablet 5   tamsulosin (FLOMAX) 0.4 MG CAPS capsule Take 0.4 mg by mouth at bedtime.     No current facility-administered medications for this visit.    PHYSICAL EXAMINATION: ECOG PERFORMANCE STATUS: 2 - Symptomatic, <50% confined to bed  Vitals:   02/23/23 1242  BP: 138/82  Pulse: 77  Resp: 18  Temp: 98.1 F (36.7 C)  SpO2: 100%   Wt Readings from Last 3 Encounters:  02/23/23 212 lb 6.4 oz (96.3 kg)  02/03/23 215 lb (97.5 kg)  02/02/23 212 lb 14.4 oz (96.6 kg)     GENERAL:alert, no distress and comfortable SKIN: skin color, texture, turgor are normal, no rashes or significant lesions EYES: normal, Conjunctiva are pink and non-injected, sclera clear NECK: supple, thyroid normal size, non-tender, without nodularity LYMPH:  no palpable lymphadenopathy in the cervical, axillary  LUNGS: clear to auscultation and percussion with normal breathing effort HEART: regular rate & rhythm and no murmurs and no lower extremity edema ABDOMEN:abdomen soft, non-tender and normal bowel sounds Musculoskeletal:no cyanosis of digits and no clubbing  NEURO: alert & oriented x 3 with fluent speech, no focal motor/sensory deficits  LABORATORY DATA:  I have reviewed the data as listed    Latest Ref Rng & Units 02/23/2023   11:35 AM 02/16/2023    1:05 PM 02/02/2023   11:00 AM  CBC  WBC 4.0 - 10.5 K/uL 9.7  11.7  10.6   Hemoglobin 13.0 - 17.0 g/dL 54.0  98.1  19.1   Hematocrit 39.0 - 52.0 % 45.0  45.1  45.6    Platelets 150 - 400 K/uL 179  177  213         Latest Ref Rng & Units 02/23/2023   11:35 AM 02/16/2023    1:05 PM 02/02/2023   11:00 AM  CMP  Glucose 70 - 99 mg/dL 478  295  621   BUN 8 - 23 mg/dL 15  13  16    Creatinine 0.61 - 1.24 mg/dL 3.08  6.57  8.46   Sodium 135 - 145 mmol/L 136  136  137   Potassium 3.5 - 5.1 mmol/L 4.5  4.2  4.3  Chloride 98 - 111 mmol/L 102  102  103   CO2 22 - 32 mmol/L 29  28  29    Calcium 8.9 - 10.3 mg/dL 9.3  9.4  9.5   Total Protein 6.5 - 8.1 g/dL 6.9  6.8  7.1   Total Bilirubin 0.3 - 1.2 mg/dL 0.7  0.6  0.6   Alkaline Phos 38 - 126 U/L 66  73  85   AST 15 - 41 U/L 13  13  14    ALT 0 - 44 U/L 11  10  15        RADIOGRAPHIC STUDIES: I have personally reviewed the radiological images as listed and agreed with the findings in the report. No results found.    Orders Placed This Encounter  Procedures   CBC with Differential (Cancer Center Only)    Standing Status:   Future    Standing Expiration Date:   04/05/2024   CMP (Cancer Center only)    Standing Status:   Future    Standing Expiration Date:   04/05/2024   All questions were answered. The patient knows to call the clinic with any problems, questions or concerns. No barriers to learning was detected. The total time spent in the appointment was 25 minutes.     Malachy Mood, MD 02/23/2023   Carolin Coy, CMA, am acting as scribe for Malachy Mood, MD.   I have reviewed the above documentation for accuracy and completeness, and I agree with the above.

## 2023-02-23 NOTE — Assessment & Plan Note (Signed)
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 -He is clinically doing well, asymptomatic.  Surgical resection was discussed with patient, but he would like to try nonsurgical options. -I discussed intravesical therapy, such as gemcitabine or mitomycin, unfortunately it would not reach to the ureter and renal pelvis to treat all the disease that he has. -I recommend systemic therapy with immunotherapy pembrolizumab 200 mg every 3 weeks, or 400 mg every 6 weeks if he tolerates well.  This has been studied in the keynote 057 trial, it demonstrated 39% complete response in 3 months in BCG unresponsive high risk nonmuscle invasive bladder cancer. -He started Keytruda on 01/12/23, tolerated first cycle well except a mild fatigue.  No other noticeable side effects.  We again reviewed potential AE's, and what to watch.  He voices good understanding. -He will follow-up with urologist Dr. Lennox Laity for repeated cystoscopy and evaluation of his response to immunotherapy

## 2023-02-23 NOTE — Patient Instructions (Signed)
[facility]  Discharge Instructions: Thank you for choosing [facility] to provide your oncology and hematology care.   If you have a lab appointment with the Cancer Center, please go directly to the Cancer Center and check in at the registration area.   Wear comfortable clothing and clothing appropriate for easy access to any Portacath or PICC line.   We strive to give you quality time with your provider. You may need to reschedule your appointment if you arrive late (15 or more minutes).  Arriving late affects you and other patients whose appointments are after yours.  Also, if you miss three or more appointments without notifying the office, you may be dismissed from the clinic at the provider's discretion.      For prescription refill requests, have your pharmacy contact our office and allow 72 hours for refills to be completed.    Today you received the following chemotherapy and/or immunotherapy agents: pembrolizumab      To help prevent nausea and vomiting after your treatment, we encourage you to take your nausea medication as directed.  BELOW ARE SYMPTOMS THAT SHOULD BE REPORTED IMMEDIATELY: *FEVER GREATER THAN 100.4 F (38 C) OR HIGHER *CHILLS OR SWEATING *NAUSEA AND VOMITING THAT IS NOT CONTROLLED WITH YOUR NAUSEA MEDICATION *UNUSUAL SHORTNESS OF BREATH *UNUSUAL BRUISING OR BLEEDING *URINARY PROBLEMS (pain or burning when urinating, or frequent urination) *BOWEL PROBLEMS (unusual diarrhea, constipation, pain near the anus) TENDERNESS IN MOUTH AND THROAT WITH OR WITHOUT PRESENCE OF ULCERS (sore throat, sores in mouth, or a toothache) UNUSUAL RASH, SWELLING OR PAIN  UNUSUAL VAGINAL DISCHARGE OR ITCHING   Items with * indicate a potential emergency and should be followed up as soon as possible or go to the Emergency Department if any problems should occur.  Please show the CHEMOTHERAPY ALERT CARD or IMMUNOTHERAPY ALERT CARD at  check-in to the Emergency Department and triage nurse.  Should you have questions after your visit or need to cancel or reschedule your appointment, please contact [facility]  Dept: [phone]  and follow the prompts.  Office hours are 8:00 a.m. to 4:30 p.m. Monday - Friday. Please note that voicemails left after 4:00 p.m. may not be returned until the following business day.  We are closed weekends and major holidays. You have access to a nurse at all times for urgent questions. Please call the main number to the clinic Dept: [phone] and follow the prompts.   For any non-urgent questions, you may also contact your provider using MyChart. We now offer e-Visits for anyone 18 and older to request care online for non-urgent symptoms. For details visit [email or web address].   Also download the MyChart app! Go to the app store, search "MyChart", open the app, select [place], and log in with your MyChart username and password.   

## 2023-02-25 LAB — T4: T4, Total: 7.8 ug/dL (ref 4.5–12.0)

## 2023-02-27 ENCOUNTER — Ambulatory Visit (INDEPENDENT_AMBULATORY_CARE_PROVIDER_SITE_OTHER): Payer: Medicare HMO | Admitting: Internal Medicine

## 2023-02-27 ENCOUNTER — Ambulatory Visit (INDEPENDENT_AMBULATORY_CARE_PROVIDER_SITE_OTHER): Payer: Medicare HMO

## 2023-02-27 ENCOUNTER — Encounter: Payer: Self-pay | Admitting: Internal Medicine

## 2023-02-27 VITALS — BP 120/78 | HR 84 | Temp 97.7°F | Ht 74.5 in | Wt 212.0 lb

## 2023-02-27 DIAGNOSIS — Z01818 Encounter for other preprocedural examination: Secondary | ICD-10-CM | POA: Insufficient documentation

## 2023-02-27 DIAGNOSIS — I10 Essential (primary) hypertension: Secondary | ICD-10-CM | POA: Diagnosis not present

## 2023-02-27 DIAGNOSIS — E785 Hyperlipidemia, unspecified: Secondary | ICD-10-CM

## 2023-02-27 DIAGNOSIS — C679 Malignant neoplasm of bladder, unspecified: Secondary | ICD-10-CM | POA: Diagnosis not present

## 2023-02-27 DIAGNOSIS — M1611 Unilateral primary osteoarthritis, right hip: Secondary | ICD-10-CM

## 2023-02-27 DIAGNOSIS — Z7901 Long term (current) use of anticoagulants: Secondary | ICD-10-CM

## 2023-02-27 DIAGNOSIS — I480 Paroxysmal atrial fibrillation: Secondary | ICD-10-CM

## 2023-02-27 DIAGNOSIS — N320 Bladder-neck obstruction: Secondary | ICD-10-CM | POA: Diagnosis not present

## 2023-02-27 DIAGNOSIS — E119 Type 2 diabetes mellitus without complications: Secondary | ICD-10-CM

## 2023-02-27 DIAGNOSIS — Z7984 Long term (current) use of oral hypoglycemic drugs: Secondary | ICD-10-CM

## 2023-02-27 LAB — CBC WITH DIFFERENTIAL/PLATELET
Basophils Absolute: 0.1 10*3/uL (ref 0.0–0.1)
Basophils Relative: 0.5 % (ref 0.0–3.0)
Eosinophils Absolute: 0.1 10*3/uL (ref 0.0–0.7)
Eosinophils Relative: 0.9 % (ref 0.0–5.0)
HCT: 47.6 % (ref 39.0–52.0)
Hemoglobin: 16.6 g/dL (ref 13.0–17.0)
Lymphocytes Relative: 25.8 % (ref 12.0–46.0)
Lymphs Abs: 2.6 10*3/uL (ref 0.7–4.0)
MCHC: 34.9 g/dL (ref 30.0–36.0)
MCV: 92.3 fl (ref 78.0–100.0)
Monocytes Absolute: 1 10*3/uL (ref 0.1–1.0)
Monocytes Relative: 9.8 % (ref 3.0–12.0)
Neutro Abs: 6.3 10*3/uL (ref 1.4–7.7)
Neutrophils Relative %: 63 % (ref 43.0–77.0)
Platelets: 212 10*3/uL (ref 150.0–400.0)
RBC: 5.16 Mil/uL (ref 4.22–5.81)
RDW: 13.7 % (ref 11.5–15.5)
WBC: 10 10*3/uL (ref 4.0–10.5)

## 2023-02-27 LAB — COMPREHENSIVE METABOLIC PANEL
ALT: 14 U/L (ref 0–53)
AST: 14 U/L (ref 0–37)
Albumin: 4.3 g/dL (ref 3.5–5.2)
Alkaline Phosphatase: 84 U/L (ref 39–117)
BUN: 14 mg/dL (ref 6–23)
CO2: 30 mEq/L (ref 19–32)
Calcium: 9.3 mg/dL (ref 8.4–10.5)
Chloride: 99 mEq/L (ref 96–112)
Creatinine, Ser: 0.83 mg/dL (ref 0.40–1.50)
GFR: 87.43 mL/min (ref >60.00)
Glucose, Bld: 131 mg/dL — ABNORMAL HIGH (ref 70–99)
Potassium: 4.2 mEq/L (ref 3.5–5.1)
Sodium: 137 mEq/L (ref 135–145)
Total Bilirubin: 0.7 mg/dL (ref 0.2–1.2)
Total Protein: 6.8 g/dL (ref 6.0–8.3)

## 2023-02-27 LAB — PSA: PSA: 6.51 ng/mL — ABNORMAL HIGH (ref 0.10–4.00)

## 2023-02-27 LAB — LIPID PANEL
Cholesterol: 116 mg/dL (ref 0–200)
HDL: 38.6 mg/dL — ABNORMAL LOW (ref >39.00)
LDL Cholesterol: 47 mg/dL (ref 0–99)
NonHDL: 77.06
Total CHOL/HDL Ratio: 3
Triglycerides: 150 mg/dL — ABNORMAL HIGH (ref 0.0–149.0)
VLDL: 30 mg/dL (ref 0.0–40.0)

## 2023-02-27 LAB — TSH: TSH: 1.06 u[IU]/mL (ref 0.35–5.50)

## 2023-02-27 NOTE — Progress Notes (Signed)
Subjective:  Patient ID: Robert [name], male    DOB: 1951-06-18  Age: 72 y.o. MRN: [id number]  CC: Medical Clearance (Surgical clearance )   HPI Robert [name] presents for pre-op exam - R THR w/spinal anesthesia in July F/u on DM, HTN, smoking  Req by Dr Veda Canning  Smoker - cigarillos  Outpatient Medications Prior to Visit  Medication Sig Dispense Refill   acetaminophen (TYLENOL) 500 MG tablet Take 1,000 mg by mouth every 6 (six) hours as needed.     apixaban (ELIQUIS) 5 MG TABS tablet Take 1 tablet (5 mg total) by mouth 2 (two) times daily. 60 tablet 5   atorvastatin (LIPITOR) 40 MG tablet TAKE ONE TABLET BY MOUTH ONCE DAILY 90 tablet 1   fluticasone (FLONASE) 50 MCG/ACT nasal spray Place 1 spray into both nostrils daily as needed for allergies or rhinitis.     glipiZIDE (GLUCOTROL) 5 MG tablet TAKE 1/2 TABLET BY MOUTH EVERY EVENING 45 tablet 3   glucose blood test strip 1 each by Other route daily. Use daily for glucose control - Dx 250.00 100 each 0   hydrochlorothiazide (MICROZIDE) 12.5 MG capsule Take 1 capsule (12.5 mg total) by mouth daily. 30 capsule 11   lisinopril (ZESTRIL) 40 MG tablet Take 1 tablet (40 mg total) by mouth daily. 90 tablet 3   metFORMIN (GLUCOPHAGE-XR) 500 MG 24 hr tablet Take 4 tablets (2,000 mg total) by mouth daily with supper. 360 tablet 3   metoprolol succinate (TOPROL-XL) 25 MG 24 hr tablet TAKE ONE TABLET BY MOUTH ONCE DAILY 90 tablet 3   Multiple Vitamins-Minerals (EMERGEN-C VITAMIN C) PACK Take 1 packet by mouth daily as needed (immune support).     Multiple Vitamins-Minerals (ZINC PO) Take 1 tablet by mouth daily as needed (immune support).     pembrolizumab (KEYTRUDA) 100 MG/4ML SOLN Inject 2 mg/kg into the vein.     propranolol (INDERAL) 10 MG tablet TAKE 1 TABLET BY MOUTH 4 TIMES DAILY AS NEEDED FOR PALPITATIONS, IRREGULAR OR FAST HEART RATE 270 tablet 1   Semaglutide, 1 MG/DOSE, 4 MG/3ML SOPN Inject 1 mg as directed once a week.  3 mL 5   sildenafil (REVATIO) 20 MG tablet TAKE 1 TO 2 TABLETS 2 TO 3 HOURS BEFORE SEX 30 tablet 5   tamsulosin (FLOMAX) 0.4 MG CAPS capsule Take 0.4 mg by mouth at bedtime.     traMADol (ULTRAM) 50 MG tablet Take 1 tablet (50 mg total) by mouth every 6 (six) hours as needed for severe pain. 20 tablet 0   No facility-administered medications prior to visit.    ROS: Review of Systems  Constitutional:  Negative for appetite change, fatigue and unexpected weight change.  HENT:  Negative for congestion, nosebleeds, sneezing, sore throat and trouble swallowing.   Eyes:  Negative for itching and visual disturbance.  Respiratory:  Negative for cough.   Cardiovascular:  Negative for chest pain, palpitations and leg swelling.  Gastrointestinal:  Negative for abdominal distention, blood in stool, diarrhea and nausea.  Genitourinary:  Positive for urgency. Negative for frequency and hematuria.  Musculoskeletal:  Positive for arthralgias and gait problem. Negative for back pain, joint swelling and neck pain.  Skin:  Negative for rash.  Neurological:  Negative for dizziness, tremors, speech difficulty and weakness.  Psychiatric/Behavioral:  Negative for agitation, dysphoric mood and sleep disturbance. The patient is not nervous/anxious.     Objective:  BP 120/78 (BP Location: Right Arm, Patient Position: Sitting, Cuff Size:  Large)   Pulse 84   Temp 97.7 F (36.5 C) (Oral)   Ht 6' 2.5" (1.892 m)   Wt 212 lb (96.2 kg)   SpO2 98%   BMI 26.86 kg/m   BP Readings from Last 3 Encounters:  02/27/23 120/78  02/23/23 138/82  02/03/23 124/72    Wt Readings from Last 3 Encounters:  02/27/23 212 lb (96.2 kg)  02/23/23 212 lb 6.4 oz (96.3 kg)  02/03/23 215 lb (97.5 kg)    Physical Exam Constitutional:      General: He is not in acute distress.    Appearance: He is well-developed. He is obese.     Comments: NAD  Eyes:     Conjunctiva/sclera: Conjunctivae normal.     Pupils: Pupils are equal,  round, and reactive to light.  Neck:     Thyroid: No thyromegaly.     Vascular: No JVD.  Cardiovascular:     Rate and Rhythm: Normal rate and regular rhythm.     Heart sounds: Normal heart sounds. No murmur heard.    No friction rub. No gallop.  Pulmonary:     Effort: Pulmonary effort is normal. No respiratory distress.     Breath sounds: Normal breath sounds. No wheezing or rales.  Chest:     Chest wall: No tenderness.  Abdominal:     General: Bowel sounds are normal. There is no distension.     Palpations: Abdomen is soft. There is no mass.     Tenderness: There is no abdominal tenderness. There is no guarding or rebound.  Musculoskeletal:        General: No tenderness. Normal range of motion.     Cervical back: Normal range of motion.  Lymphadenopathy:     Cervical: No cervical adenopathy.  Skin:    General: Skin is warm and dry.     Findings: No rash.  Neurological:     Mental Status: He is alert and oriented to person, place, and time.     Cranial Nerves: No cranial nerve deficit.     Motor: No abnormal muscle tone.     Coordination: Coordination normal.     Gait: Gait normal.     Deep Tendon Reflexes: Reflexes are normal and symmetric.  Psychiatric:        Behavior: Behavior normal.        Thought Content: Thought content normal.        Judgment: Judgment normal.   R hip w/pain Using a cane  Lab Results  Component Value Date   WBC 10.0 02/27/2023   HGB 16.6 02/27/2023   HCT 47.6 02/27/2023   PLT 212.0 02/27/2023   GLUCOSE 131 (H) 02/27/2023   CHOL 116 02/27/2023   TRIG 150.0 (H) 02/27/2023   HDL 38.60 (L) 02/27/2023   LDLDIRECT 77.0 07/07/2015   LDLCALC 47 02/27/2023   ALT 14 02/27/2023   AST 14 02/27/2023   NA 137 02/27/2023   K 4.2 02/27/2023   CL 99 02/27/2023   CREATININE 0.83 02/27/2023   BUN 14 02/27/2023   CO2 30 02/27/2023   TSH 1.06 02/27/2023   PSA 6.51 (H) 02/27/2023   HGBA1C 6.7 (A) 02/03/2023   MICROALBUR 4.0 (H) 01/28/2021    DG  C-Arm 1-60 Min-No Report  Result Date: 12/02/2022 Fluoroscopy was utilized by the requesting physician.  No radiographic interpretation.    Assessment & Plan:   Problem List Items Addressed This Visit     Hyperlipidemia    Continue on atorvastatin  Essential hypertension    BP Readings from Last 3 Encounters:  02/27/23 120/78  02/23/23 138/82  02/03/23 124/72  Continue with HCTZ, lisinopril, Toprol      Type 2 diabetes mellitus without complication, without long-term current use of insulin (HCC)    Continue with Ozempic and glipizide      Relevant Orders   DG Chest 2 View (Completed)   TSH (Completed)   Urinalysis   CBC with Differential/Platelet (Completed)   Lipid panel (Completed)   Comprehensive metabolic panel (Completed)   Basic metabolic panel   PSA (Completed)   Long term current use of anticoagulant -Eliquis    On Eliquis      Paroxysmal atrial fibrillation (HCC)    On Eliquis Continue with  Toprol Propranolol was given previously to use as needed      Relevant Orders   DG Chest 2 View (Completed)   TSH (Completed)   Urinalysis   CBC with Differential/Platelet (Completed)   Lipid panel (Completed)   Comprehensive metabolic panel (Completed)   Basic metabolic panel   PSA (Completed)   Bladder cancer (HCC)    Follow-up with Dr. Mena Goes      Osteoarthritis of right hip    Robert [name] is cleared for surgery.  He [name] to stop smoking. Obtain labs, chest x-ray Thank you      Preop examination - Primary    Clear CXR Labs      Relevant Orders   DG Chest 2 View (Completed)   TSH (Completed)   Urinalysis   CBC with Differential/Platelet (Completed)   Lipid panel (Completed)   Comprehensive metabolic panel (Completed)   Basic metabolic panel   PSA (Completed)   Other Visit Diagnoses     Bladder neck obstruction       Relevant Orders   PSA (Completed)         No orders of the defined types were placed in this encounter.      Follow-up: Return in about 4 months (around 06/30/2023) for a follow-up visit.  Sonda Primes, MD

## 2023-02-27 NOTE — Assessment & Plan Note (Signed)
BP Readings from Last 3 Encounters:  02/27/23 120/78  02/23/23 138/82  02/03/23 124/72   Continue with HCTZ, lisinopril, Toprol

## 2023-02-27 NOTE — Assessment & Plan Note (Signed)
On Eliquis ?Continue with  Toprol ?Propranolol was given previously to use as needed ?

## 2023-02-27 NOTE — Assessment & Plan Note (Addendum)
Continue with Ozempic and glipizide

## 2023-02-27 NOTE — Assessment & Plan Note (Addendum)
Clear CXR Labs

## 2023-02-28 ENCOUNTER — Encounter: Payer: Self-pay | Admitting: Hematology

## 2023-02-28 ENCOUNTER — Encounter: Payer: Self-pay | Admitting: Internal Medicine

## 2023-02-28 ENCOUNTER — Other Ambulatory Visit: Payer: Self-pay

## 2023-02-28 DIAGNOSIS — R972 Elevated prostate specific antigen [PSA]: Secondary | ICD-10-CM | POA: Insufficient documentation

## 2023-02-28 LAB — URINALYSIS, ROUTINE W REFLEX MICROSCOPIC
Bilirubin Urine: NEGATIVE
Ketones, ur: NEGATIVE
Leukocytes,Ua: NEGATIVE
Nitrite: NEGATIVE
Specific Gravity, Urine: 1.02 (ref 1.000–1.030)
Total Protein, Urine: 30 — AB
Urine Glucose: 100 — AB
Urobilinogen, UA: 0.2 (ref 0.0–1.0)
pH: 6.5 (ref 5.0–8.0)

## 2023-03-06 ENCOUNTER — Other Ambulatory Visit: Payer: Self-pay | Admitting: Internal Medicine

## 2023-03-06 DIAGNOSIS — E785 Hyperlipidemia, unspecified: Secondary | ICD-10-CM

## 2023-03-06 NOTE — Assessment & Plan Note (Signed)
On Eliquis

## 2023-03-06 NOTE — Assessment & Plan Note (Signed)
Follow-up with Dr. Mena Goes

## 2023-03-06 NOTE — Assessment & Plan Note (Signed)
Robert [name] is cleared for surgery.  He needs to stop smoking. Obtain labs, chest x-ray Thank you

## 2023-03-06 NOTE — Assessment & Plan Note (Signed)
Continue on atorvastatin.

## 2023-03-08 ENCOUNTER — Encounter: Payer: Self-pay | Admitting: Internal Medicine

## 2023-03-08 NOTE — Telephone Encounter (Signed)
Last refilled by [name], [name], MD. Pls advise.[name] KitchenRaechel Chute

## 2023-03-10 ENCOUNTER — Other Ambulatory Visit: Payer: Self-pay | Admitting: Internal Medicine

## 2023-03-10 DIAGNOSIS — E119 Type 2 diabetes mellitus without complications: Secondary | ICD-10-CM

## 2023-03-10 MED ORDER — SILDENAFIL CITRATE 20 MG PO TABS
ORAL_TABLET | ORAL | 5 refills | Status: DC
Start: 2023-03-10 — End: 2023-07-03

## 2023-03-16 ENCOUNTER — Inpatient Hospital Stay: Payer: Medicare HMO | Admitting: Hematology

## 2023-03-16 ENCOUNTER — Inpatient Hospital Stay: Payer: Medicare HMO

## 2023-03-16 ENCOUNTER — Other Ambulatory Visit: Payer: Self-pay

## 2023-03-16 ENCOUNTER — Encounter: Payer: Self-pay | Admitting: Hematology

## 2023-03-16 VITALS — BP 128/77 | HR 85 | Temp 97.3°F | Resp 15 | Ht 74.5 in | Wt 212.8 lb

## 2023-03-16 VITALS — BP 124/69 | HR 76

## 2023-03-16 DIAGNOSIS — C679 Malignant neoplasm of bladder, unspecified: Secondary | ICD-10-CM | POA: Diagnosis not present

## 2023-03-16 DIAGNOSIS — Z79620 Long term (current) use of immunosuppressive biologic: Secondary | ICD-10-CM | POA: Diagnosis not present

## 2023-03-16 DIAGNOSIS — Z5112 Encounter for antineoplastic immunotherapy: Secondary | ICD-10-CM | POA: Diagnosis not present

## 2023-03-16 DIAGNOSIS — R5383 Other fatigue: Secondary | ICD-10-CM | POA: Diagnosis not present

## 2023-03-16 LAB — CBC WITH DIFFERENTIAL (CANCER CENTER ONLY)
Abs Immature Granulocytes: 0.1 10*3/uL — ABNORMAL HIGH (ref 0.00–0.07)
Basophils Absolute: 0.1 10*3/uL (ref 0.0–0.1)
Basophils Relative: 1 %
Eosinophils Absolute: 0.1 10*3/uL (ref 0.0–0.5)
Eosinophils Relative: 1 %
HCT: 45.3 % (ref 39.0–52.0)
Hemoglobin: 16.3 g/dL (ref 13.0–17.0)
Immature Granulocytes: 1 %
Lymphocytes Relative: 24 %
Lymphs Abs: 2.6 10*3/uL (ref 0.7–4.0)
MCH: 32.2 pg (ref 26.0–34.0)
MCHC: 36 g/dL (ref 30.0–36.0)
MCV: 89.5 fL (ref 80.0–100.0)
Monocytes Absolute: 0.9 10*3/uL (ref 0.1–1.0)
Monocytes Relative: 8 %
Neutro Abs: 7 10*3/uL (ref 1.7–7.7)
Neutrophils Relative %: 65 %
Platelet Count: 190 10*3/uL (ref 150–400)
RBC: 5.06 MIL/uL (ref 4.22–5.81)
RDW: 12.9 % (ref 11.5–15.5)
WBC Count: 10.8 10*3/uL — ABNORMAL HIGH (ref 4.0–10.5)
nRBC: 0 % (ref 0.0–0.2)

## 2023-03-16 LAB — CMP (CANCER CENTER ONLY)
ALT: 14 U/L (ref 0–44)
AST: 15 U/L (ref 15–41)
Albumin: 4.3 g/dL (ref 3.5–5.0)
Alkaline Phosphatase: 72 U/L (ref 38–126)
Anion gap: 8 (ref 5–15)
BUN: 17 mg/dL (ref 8–23)
CO2: 26 mmol/L (ref 22–32)
Calcium: 9.7 mg/dL (ref 8.9–10.3)
Chloride: 103 mmol/L (ref 98–111)
Creatinine: 0.94 mg/dL (ref 0.61–1.24)
GFR, Estimated: >60 mL/min (ref >60)
Glucose, Bld: 167 mg/dL — ABNORMAL HIGH (ref 70–99)
Potassium: 4.4 mmol/L (ref 3.5–5.1)
Sodium: 137 mmol/L (ref 135–145)
Total Bilirubin: 0.5 mg/dL (ref 0.3–1.2)
Total Protein: 6.9 g/dL (ref 6.5–8.1)

## 2023-03-16 MED ORDER — SODIUM CHLORIDE 0.9 % IV SOLN: Freq: Once | INTRAVENOUS | Status: AC

## 2023-03-16 MED ORDER — SODIUM CHLORIDE 0.9 % IV SOLN
200.0000 mg | Freq: Once | INTRAVENOUS | Status: AC
  Administered 2023-03-16: 200 mg via INTRAVENOUS
  Filled 2023-03-16: qty 8

## 2023-03-16 NOTE — Patient Instructions (Addendum)
[facility]  Discharge Instructions: Thank you for choosing [facility] to provide your oncology and hematology care.   If you have a lab appointment with the Cancer Center, please go directly to the Cancer Center and check in at the registration area.   Wear comfortable clothing and clothing appropriate for easy access to any Portacath or PICC line.   We strive to give you quality time with your provider. You may need to reschedule your appointment if you arrive late (15 or more minutes).  Arriving late affects you and other patients whose appointments are after yours.  Also, if you miss three or more appointments without notifying the office, you may be dismissed from the clinic at the provider's discretion.      For prescription refill requests, have your pharmacy contact our office and allow 72 hours for refills to be completed.    Today you received the following chemotherapy and/or immunotherapy agents: Keytruda      To help prevent nausea and vomiting after your treatment, we encourage you to take your nausea medication as directed.  BELOW ARE SYMPTOMS THAT SHOULD BE REPORTED IMMEDIATELY: *FEVER GREATER THAN 100.4 F (38 C) OR HIGHER *CHILLS OR SWEATING *NAUSEA AND VOMITING THAT IS NOT CONTROLLED WITH YOUR NAUSEA MEDICATION *UNUSUAL SHORTNESS OF BREATH *UNUSUAL BRUISING OR BLEEDING *URINARY PROBLEMS (pain or burning when urinating, or frequent urination) *BOWEL PROBLEMS (unusual diarrhea, constipation, pain near the anus) TENDERNESS IN MOUTH AND THROAT WITH OR WITHOUT PRESENCE OF ULCERS (sore throat, sores in mouth, or a toothache) UNUSUAL RASH, SWELLING OR PAIN  UNUSUAL VAGINAL DISCHARGE OR ITCHING   Items with * indicate a potential emergency and should be followed up as soon as possible or go to the Emergency Department if any problems should occur.  Please show the CHEMOTHERAPY ALERT CARD or IMMUNOTHERAPY ALERT CARD at  check-in to the Emergency Department and triage nurse.  Should you have questions after your visit or need to cancel or reschedule your appointment, please contact [facility]  Dept: [phone]  and follow the prompts.  Office hours are 8:00 a.m. to 4:30 p.m. Monday - Friday. Please note that voicemails left after 4:00 p.m. may not be returned until the following business day.  We are closed weekends and major holidays. You have access to a nurse at all times for urgent questions. Please call the main number to the clinic Dept: [phone] and follow the prompts.   For any non-urgent questions, you may also contact your provider using MyChart. We now offer e-Visits for anyone 18 and older to request care online for non-urgent symptoms. For details visit [email or web address].   Also download the MyChart app! Go to the app store, search "MyChart", open the app, select [place], and log in with your MyChart username and password.   

## 2023-03-16 NOTE — Assessment & Plan Note (Signed)
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 -He is clinically doing well, asymptomatic.  Surgical resection was discussed with patient, but he would like to try nonsurgical options. -I discussed intravesical therapy, such as gemcitabine or mitomycin, unfortunately it would not reach to the ureter and renal pelvis to treat all the disease that he has. -I recommend systemic therapy with immunotherapy pembrolizumab 200 mg every 3 weeks, or 400 mg every 6 weeks if he tolerates well.  This has been studied in the keynote 057 trial, it demonstrated 39% complete response in 3 months in BCG unresponsive high risk nonmuscle invasive bladder cancer. -He started Keytruda on 01/12/23, tolerated first cycle well except a mild fatigue.  No other noticeable side effects.  We again reviewed potential AE's, and what to watch.  He voices good understanding. -He will follow-up with urologist Dr. Lennox Laity for repeated cystoscopy and evaluation of his response to immunotherapy -Per patient, she underwent cystoscopy last months and in no visible residual tumor in bladder. -He is tolerating Keytruda very well, no noticeable side effect.  Will continue.  We discussed option to change treatment to every 6 weeks if he tolerates well.

## 2023-03-16 NOTE — Progress Notes (Signed)
[facility]   Telephone:(336) [phone] Fax:(336) [phone]   Clinic Follow up Note   Patient Care Team: Plotnikov, [name], MD as PCP - General (Internal Medicine) Nahser, Deloris Ping, MD as PCP - Cardiology (Cardiology) Verner Chol, [facility] (Inactive) as Pharmacist (Pharmacist) Carlus Pavlov, MD as Consulting Physician (Internal Medicine) Jerilee Field, MD as Consulting Physician (Urology) Iva Boop, MD as Consulting Physician (Gastroenterology) Janet Berlin, MD as Consulting Physician (Ophthalmology) Malachy Mood, MD as Attending Physician (Hematology and Oncology)  Date of Service:  03/16/2023  CHIEF COMPLAINT: f/u of Recurrent bladder cancer   CURRENT THERAPY:  pembrolizumab every 3 weeks     ASSESSMENT:  Robert [name] is a 72 y.o. 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 -He is clinically doing well, asymptomatic.  Surgical resection was discussed with patient, but he would like to try nonsurgical options. -I discussed intravesical therapy, such as gemcitabine or mitomycin, unfortunately it would not reach to the ureter and renal pelvis to treat all the disease that he has. -I recommend systemic therapy with immunotherapy pembrolizumab 200 mg every 3 weeks, or 400 mg every 6 weeks if he tolerates well.  This has been studied in the keynote 057 trial, it demonstrated 39% complete response in 3 months in BCG unresponsive high risk nonmuscle invasive bladder cancer. -He started Keytruda on 01/12/23, tolerated first cycle well except a mild fatigue.  No other noticeable side effects.  We again reviewed potential AE's, and what to watch.  He voices good understanding. -He will follow-up with urologist Dr. Lennox Laity for repeated cystoscopy and evaluation of his response to immunotherapy -Per  patient, she underwent cystoscopy last months and in no visible residual tumor in bladder. -He is tolerating Keytruda very well, no noticeable side effect.  Will continue.  We discussed option to change treatment to every 6 weeks if he tolerates well.    PLAN: -lab reviewed - Proceed with C4 Keytruda today -plan to increase Keytruda dose to 400mg  on next cycle in 3 weeks  -lab and f/u and keytruda 6/13  SUMMARY OF ONCOLOGIC HISTORY: Oncology History Overview Note   Cancer Staging  Bladder cancer [facility]) Staging form: Urinary Bladder, AJCC 8th Edition - Clinical stage from 05/14/2021: Stage 0a (cTa, cN0, cM0) - Signed by Malachy Mood, MD on 01/02/2023 Stage prefix: Initial diagnosis WHO/ISUP grade (low/high): High Grade Histologic grading system: 2 grade system - Pathologic stage from 02/22/2022: Stage I (pT1, pN0, cM0) - Signed by Malachy Mood, MD on 01/02/2023 WHO/ISUP grade (low/high): High Grade Histologic grading system: 2 grade system     Bladder cancer (HCC)  05/14/2021 Cancer Staging   Staging form: Urinary Bladder, AJCC 8th Edition - Clinical stage from 05/14/2021: Stage 0a (cTa, cN0, cM0) - Signed by Malachy Mood, MD on 01/02/2023 Stage prefix: Initial diagnosis WHO/ISUP grade (low/high): High Grade Histologic grading system: 2 grade system   01/31/2022 Initial Diagnosis   Bladder cancer (HCC)   02/22/2022 Cancer Staging   Staging form: Urinary Bladder, AJCC 8th Edition - Pathologic stage from 02/22/2022: Stage I (pT1, pN0, cM0) - Signed by Malachy Mood, MD on 01/02/2023 WHO/ISUP grade (low/high): High Grade Histologic grading system: 2 grade system   01/12/2023 -  Chemotherapy   Patient is on Treatment Plan : BLADDER Pembrolizumab (200) q21d        INTERVAL HISTORY:  Robert [name] is here for a follow up of Recurrent bladder cancer. He was last seen by me on 02/23/2023. He presents to the clinic accompanied by son. Pt reports that he had no issues from the chemo.he has some  swelling in the right foot.       All other systems were reviewed with the patient and are negative.  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[facility])    endocrinologist--- dr Elvera Lennox --  (03-11-2022 per pt check blood sugar 1-2 times daily,  fasting sugar-- 118-120s)   Nocturia    Paroxysmal atrial fibrillation (HCC) 03/12/2019   cardiologist--- dr Melburn Popper   Ureterocele     SURGICAL HISTORY: Past Surgical History:  Procedure Laterality Date   BLEPHAROPLASTY Bilateral    per pt approx  2001;   upper eyelid's   COLONOSCOPY     last one approx 2020   CYSTOSCOPY WITH URETEROSCOPY AND STENT PLACEMENT Right 12/02/2022   Procedure: CYSTOSCOPY WITH RIGHT URETEROSCOPY, RIGHT DIAGNOSTIC URETEROSCOPY;  Surgeon: Jerilee Field, MD;  Location: [facility];  Service: Urology;  Laterality: Right;  90 MINS FOR CASE   PATELLAR TENDON REPAIR Left 04/16/2004   @[facility]    TRANSURETHRAL RESECTION OF BLADDER TUMOR N/A 06/18/2021   Procedure: TRANSURETHRAL RESECTION OF BLADDER TUMOR (TURBT) WITH RIGHT URETERAL STENT PLACEMENT, RIGHT URETEROSCOPY WITH DISTRACTION OF TUMOR, FULGURATION;  Surgeon: Jerilee Field, MD;  Location: [facility];  Service: Urology;  Laterality: N/A;   TRANSURETHRAL RESECTION OF BLADDER TUMOR N/A 03/15/2022   Procedure: TRANSURETHRAL RESECTION OF BLADDER TUMOR (TURBT) WITH BILATERAL URETEROSCOPY/URETHRAL/ DILATION/BILATERAL RETROGRADE PYELOGRAM/BIOPSY AND FULGURATION OF RIGHT URETERAL CANCER/RIGHT STENT PLACEMENT;  Surgeon: Jerilee Field, MD;  [facility];  Service:  Urology;  Laterality: N/A;   TRANSURETHRAL RESECTION OF BLADDER TUMOR WITH MITOMYCIN-C N/A 12/02/2022   Procedure: TRANSURETHRAL RESECTION OF BLADDER TUMOR;  Surgeon: Jerilee Field, MD;  Location: [facility];  Service: Urology;  Laterality: N/A;    I have reviewed the social history and family history with the patient and they are unchanged from previous note.  ALLERGIES:  has No Known Allergies.  MEDICATIONS:  Current Outpatient Medications  Medication Sig Dispense Refill   acetaminophen (TYLENOL) 500 MG tablet Take 1,000 mg by mouth every 6 (six) hours as needed.     apixaban (ELIQUIS) 5 MG TABS tablet Take 1 tablet (5 mg total) by mouth 2 (two) times daily. 60 tablet 5   atorvastatin (LIPITOR) 40 MG tablet TAKE ONE TABLET BY MOUTH ONCE DAILY 90 tablet 1   fluticasone (FLONASE) 50 MCG/ACT nasal spray Place 1 spray into both nostrils daily as needed for allergies or rhinitis.     glipiZIDE (GLUCOTROL) 5 MG tablet TAKE 1/2 TABLET BY MOUTH EVERY EVENING 45 tablet 3   glucose blood test strip 1 each by Other route daily. Use daily for glucose control - Dx 250.00 100 each 0   hydrochlorothiazide (MICROZIDE) 12.5 MG capsule Take 1 capsule (12.5 mg total) by mouth daily. 30 capsule 11   lisinopril (ZESTRIL) 40 MG tablet  Take 1 tablet (40 mg total) by mouth daily. 90 tablet 3   metFORMIN (GLUCOPHAGE-XR) 500 MG 24 hr tablet Take 4 tablets (2,000 mg total) by mouth daily with supper. 360 tablet 3   metoprolol succinate (TOPROL-XL) 25 MG 24 hr tablet TAKE ONE TABLET BY MOUTH ONCE DAILY 90 tablet 3   Multiple Vitamins-Minerals (EMERGEN-C VITAMIN C) PACK Take 1 packet by mouth daily as needed (immune support).     Multiple Vitamins-Minerals (ZINC PO) Take 1 tablet by mouth daily as needed (immune support).     pembrolizumab (KEYTRUDA) 100 MG/4ML SOLN Inject 2 mg/kg into the vein.     propranolol (INDERAL) 10 MG tablet TAKE 1 TABLET BY MOUTH 4 TIMES DAILY AS NEEDED FOR PALPITATIONS, IRREGULAR OR FAST  HEART RATE 270 tablet 1   Semaglutide, 1 MG/DOSE, 4 MG/3ML SOPN Inject 1 mg as directed once a week. 3 mL 5   sildenafil (REVATIO) 20 MG tablet TAKE 1 TO 5 TABLETS 2 TO 3 HOURS BEFORE SEX 60 tablet 5   tamsulosin (FLOMAX) 0.4 MG CAPS capsule Take 0.4 mg by mouth at bedtime.     traMADol (ULTRAM) 50 MG tablet Take 1 tablet (50 mg total) by mouth every 6 (six) hours as needed for severe pain. 20 tablet 0   No current facility-administered medications for this visit.    PHYSICAL EXAMINATION: ECOG PERFORMANCE STATUS: 2 - Symptomatic, <50% confined to bed  Vitals:   03/16/23 1328  BP: 128/77  Pulse: 85  Resp: 15  Temp: (!) 97.3 F (36.3 C)  SpO2: 97%   Wt Readings from Last 3 Encounters:  03/16/23 212 lb 12.8 oz (96.5 kg)  02/27/23 212 lb (96.2 kg)  02/23/23 212 lb 6.4 oz (96.3 kg)     GENERAL:alert, no distress and comfortable SKIN: skin color normal, no rashes or significant lesions EYES: normal, Conjunctiva are pink and non-injected, sclera clear  NEURO: alert & oriented x 3 with fluent speech LABORATORY DATA:  I have reviewed the data as listed    Latest Ref Rng & Units 03/16/2023   12:51 PM 02/27/2023    3:47 PM 02/23/2023   11:35 AM  CBC  WBC 4.0 - 10.5 K/uL 10.8  10.0  9.7   Hemoglobin 13.0 - 17.0 g/dL 16.1  09.6  04.5   Hematocrit 39.0 - 52.0 % 45.3  47.6  45.0   Platelets 150 - 400 K/uL 190  212.0  179         Latest Ref Rng & Units 03/16/2023   12:51 PM 02/27/2023    3:47 PM 02/23/2023   11:35 AM  CMP  Glucose 70 - 99 mg/dL 409  811  914   BUN 8 - 23 mg/dL 17  14  15    Creatinine 0.61 - 1.24 mg/dL 7.82  9.56  2.13   Sodium 135 - 145 mmol/L 137  137  136   Potassium 3.5 - 5.1 mmol/L 4.4  4.2  4.5   Chloride 98 - 111 mmol/L 103  99  102   CO2 22 - 32 mmol/L 26  30  29    Calcium 8.9 - 10.3 mg/dL 9.7  9.3  9.3   Total Protein 6.5 - 8.1 g/dL 6.9  6.8  6.9   Total Bilirubin 0.3 - 1.2 mg/dL 0.5  0.7  0.7   Alkaline Phos 38 - 126 U/L 72  84  66   AST 15 - 41 U/L 15   14  13    ALT  0 - 44 U/L 14  14  11        RADIOGRAPHIC STUDIES: I have personally reviewed the radiological images as listed and agreed with the findings in the report. No results found.    Orders Placed This Encounter  Procedures   CBC with Differential (Cancer Center Only)    Standing Status:   Future    Standing Expiration Date:   05/17/2024   CMP (Cancer Center only)    Standing Status:   Future    Standing Expiration Date:   05/17/2024   T4    Standing Status:   Future    Standing Expiration Date:   05/17/2024   TSH    Standing Status:   Future    Standing Expiration Date:   05/17/2024   All questions were answered. The patient knows to call the clinic with any problems, questions or concerns. No barriers to learning was detected. The total time spent in the appointment was 25 minutes.     Malachy Mood, MD 03/16/2023   Carolin Coy, CMA, am acting as scribe for Malachy Mood, MD.   I have reviewed the above documentation for accuracy and completeness, and I agree with the above.

## 2023-03-17 DIAGNOSIS — H25013 Cortical age-related cataract, bilateral: Secondary | ICD-10-CM | POA: Diagnosis not present

## 2023-03-17 DIAGNOSIS — H2513 Age-related nuclear cataract, bilateral: Secondary | ICD-10-CM | POA: Diagnosis not present

## 2023-03-17 DIAGNOSIS — H5203 Hypermetropia, bilateral: Secondary | ICD-10-CM | POA: Diagnosis not present

## 2023-03-17 DIAGNOSIS — H524 Presbyopia: Secondary | ICD-10-CM | POA: Diagnosis not present

## 2023-03-17 DIAGNOSIS — E119 Type 2 diabetes mellitus without complications: Secondary | ICD-10-CM | POA: Diagnosis not present

## 2023-03-28 ENCOUNTER — Ambulatory Visit: Payer: Medicare HMO | Admitting: Internal Medicine

## 2023-03-29 DIAGNOSIS — C678 Malignant neoplasm of overlapping sites of bladder: Secondary | ICD-10-CM | POA: Diagnosis not present

## 2023-03-29 DIAGNOSIS — C661 Malignant neoplasm of right ureter: Secondary | ICD-10-CM | POA: Diagnosis not present

## 2023-03-30 ENCOUNTER — Encounter: Payer: Self-pay | Admitting: Hematology

## 2023-03-31 ENCOUNTER — Ambulatory Visit (INDEPENDENT_AMBULATORY_CARE_PROVIDER_SITE_OTHER): Payer: Medicare HMO

## 2023-03-31 VITALS — Ht 75.6 in | Wt 212.0 lb

## 2023-03-31 DIAGNOSIS — Z0000 Encounter for general adult medical examination without abnormal findings: Secondary | ICD-10-CM

## 2023-03-31 NOTE — Patient Instructions (Addendum)
[name] , Thank you for taking time to come for your Medicare Wellness Visit. I appreciate your ongoing commitment to your health goals. Please review the following plan we discussed and let me know if I can assist you in the future.   These are the goals we discussed:  Goals       Get healthy (pt-stated)      Stay Healthy.      Just recently diagnosed with bladder cancer and I hope everything turns around for me.  I start my treatment in a couple of weeks for 6  weeks.        This is a list of the screening recommended for you and due dates:  Health Maintenance  Topic Date Due   DTaP/Tdap/Td vaccine (2 - Tdap) 08/20/2017   Eye exam for diabetics  12/25/2022   Complete foot exam   03/29/2023   Yearly kidney health urinalysis for diabetes  04/01/2023*   COVID-19 Vaccine (4 - 2023-24 season) 04/16/2023*   Hepatitis C Screening  03/30/2024*   Flu Shot  05/25/2023   Hemoglobin A1C  08/05/2023   Yearly kidney function blood test for diabetes  03/15/2024   Medicare Annual Wellness Visit  03/30/2024   Colon Cancer Screening  06/18/2027   Pneumonia Vaccine  Completed   HPV Vaccine  Aged Out   Zoster (Shingles) Vaccine  Discontinued  *Topic was postponed. The date shown is not the original due date.   Opioid Pain Medicine Management Opioids are powerful medicines that are used to treat moderate to severe pain. When used for short periods of time, they can help you to: Sleep better. Do better in physical or occupational therapy. Feel better in the first few days after an injury. Recover from surgery. Opioids should be taken with the supervision of a trained health care provider. They should be taken for the shortest period of time possible. This is because opioids can be addictive, and the longer you take opioids, the greater your risk of addiction. This addiction can also be called opioid use disorder. What are the risks? Using opioid pain medicines for longer than 3 days increases  your risk of side effects. Side effects include: Constipation. Nausea and vomiting. Breathing difficulties (respiratory depression). Drowsiness. Confusion. Opioid use disorder. Itching. Taking opioid pain medicine for a long period of time can affect your ability to do daily tasks. It also puts you at risk for: Motor vehicle crashes. Depression. Suicide. Heart attack. Overdose, which can be life-threatening. What is a pain treatment plan? A pain treatment plan is an agreement between you and your health care provider. Pain is unique to each person, and treatments vary depending on your condition. To manage your pain, you and your health care provider need to work together. To help you do this: Discuss the goals of your treatment, including how much pain you might expect to have and how you will manage the pain. Review the risks and benefits of taking opioid medicines. Remember that a good treatment plan uses more than one approach and minimizes the chance of side effects. Be honest about the amount of medicines you take and about any drug or alcohol use. Get pain medicine prescriptions from only one health care provider. Pain can be managed with many types of alternative treatments. Ask your health care provider to refer you to one or more specialists who can help you manage pain through: Physical or occupational therapy. Counseling (cognitive behavioral therapy). Good nutrition. Biofeedback. Massage. Meditation. Non-opioid  medicine. Following a gentle exercise program. How to use opioid pain medicine Taking medicine Take your pain medicine exactly as told by your health care provider. Take it only when you need it. If your pain gets less severe, you may take less than your prescribed dose if your health care provider approves. If you are not having pain, do nottake pain medicine unless your health care provider tells you to take it. If your pain is severe, do nottry to treat it  yourself by taking more pills than instructed on your prescription. Contact your health care provider for help. Write down the times when you take your pain medicine. It is easy to become confused while on pain medicine. Writing the time can help you avoid overdose. Take other over-the-counter or prescription medicines only as told by your health care provider. Keeping yourself and others safe  While you are taking opioid pain medicine: Do not drive, use machinery, or power tools. Do not sign legal documents. Do not drink alcohol. Do not take sleeping pills. Do not supervise children by yourself. Do not do activities that require climbing or being in high places. Do not go to a lake, river, ocean, spa, or swimming pool. Do not share your pain medicine with anyone. Keep pain medicine in a locked cabinet or in a secure area where pets and children cannot reach it. Stopping your use of opioids If you have been taking opioid medicine for more than a few weeks, you may need to slowly decrease (taper) how much you take until you stop completely. Tapering your use of opioids can decrease your risk of symptoms of withdrawal, such as: Pain and cramping in the abdomen. Nausea. Sweating. Sleepiness. Restlessness. Uncontrollable shaking (tremors). Cravings for the medicine. Do not attempt to taper your use of opioids on your own. Talk with your health care provider about how to do this. Your health care provider may prescribe a step-down schedule based on how much medicine you are taking and how long you have been taking it. Getting rid of leftover pills Do not save any leftover pills. Get rid of leftover pills safely by: Taking the medicine to a prescription take-back program. This is usually offered by the county or law enforcement. Bringing them to a pharmacy that has a drug disposal container. Flushing them down the toilet. Check the label or package insert of your medicine to see whether this  is safe to do. Throwing them out in the trash. Check the label or package insert of your medicine to see whether this is safe to do. If it is safe to throw it out, remove the medicine from the original container, put it into a sealable bag or container, and mix it with used coffee grounds, food scraps, dirt, or cat litter before putting it in the trash. Follow these instructions at home: Activity Do exercises as told by your health care provider. Avoid activities that make your pain worse. Return to your normal activities as told by your health care provider. Ask your health care provider what activities are safe for you. General instructions You may need to take these actions to prevent or treat constipation: Drink enough fluid to keep your urine pale yellow. Take over-the-counter or prescription medicines. Eat foods that are high in fiber, such as beans, whole grains, and fresh fruits and vegetables. Limit foods that are high in fat and processed sugars, such as fried or sweet foods. Keep all follow-up visits. This is important. Where to find support  If you have been taking opioids for a long time, you may benefit from receiving support for quitting from a local support group or counselor. Ask your health care provider for a referral to these resources in your area. Where to find more information Centers for Disease Control and Prevention (CDC): FootballExhibition.com.br [place] Food and Drug Administration (FDA): PumpkinSearch.com.ee Get help right away if: You may have taken too much of an opioid (overdosed). Common symptoms of an overdose: Your breathing is slower or more shallow than normal. You have a very slow heartbeat (pulse). You have slurred speech. You have nausea and vomiting. Your pupils become very small. You have other potential symptoms: You are very confused. You faint or feel like you will faint. You have cold, clammy skin. You have blue lips or fingernails. You have thoughts of harming  yourself or harming others. These symptoms may represent a serious problem that is an emergency. Do not wait to see if the symptoms will go away. Get medical help right away. Call your local emergency services (911 in the [place] Do not drive yourself to the hospital.  If you ever feel like you may hurt yourself or others, or have thoughts about taking your own life, get help right away. Go to your nearest emergency department or: Call your local emergency services (911 in the [place] Call the [facility] (662-037-6957 in the [place] Call a suicide crisis helpline, such as the National Suicide Prevention Lifeline at 843-542-5679 or 988 in the [place] This is open 24 hours a day in the [place] Text the Crisis Text Line at 681 249 2108 (in the [place] Summary Opioid medicines can help you manage moderate to severe pain for a short period of time. A pain treatment plan is an agreement between you and your health care provider. Discuss the goals of your treatment, including how much pain you might expect to have and how you will manage the pain. If you think that you or someone else may have taken too much of an opioid, get medical help right away. This information is not intended to replace advice given to you by your health care provider. Make sure you discuss any questions you have with your health care provider. Document Revised: 05/05/2021 Document Reviewed: 01/20/2021 Elsevier Patient Education  2024 [employer]  Advanced directives: Please bring a copy of your health care power of attorney and living will to the office to be added to your chart at your convenience.   Conditions/risks identified: None  Next appointment: Follow up in one year for your annual wellness visit.   Preventive Care 84 Years and Older, Male  Preventive care refers to lifestyle choices and visits with your health care provider that can promote health and wellness. What does preventive care include? A  yearly physical exam. This is also called an annual well check. Dental exams once or twice a year. Routine eye exams. Ask your health care provider how often you should have your eyes checked. Personal lifestyle choices, including: Daily care of your teeth and gums. Regular physical activity. Eating a healthy diet. Avoiding tobacco and drug use. Limiting alcohol use. Practicing safe sex. Taking low doses of aspirin every day. Taking vitamin and mineral supplements as recommended by your health care provider. What happens during an annual well check? The services and screenings done by your health care provider during your annual well check will depend on your age, overall health, lifestyle risk factors, and family history of disease. Counseling  Your  health care provider may ask you questions about your: Alcohol use. Tobacco use. Drug use. Emotional well-being. Home and relationship well-being. Sexual activity. Eating habits. History of falls. Memory and ability to understand (cognition). Work and work Astronomer. Screening  You may have the following tests or measurements: Height, weight, and BMI. Blood pressure. Lipid and cholesterol levels. These may be checked every 5 years, or more frequently if you are over 57 years old. Skin check. Lung cancer screening. You may have this screening every year starting at age 1 if you have a 30-pack-year history of smoking and currently smoke or have quit within the past 15 years. Fecal occult blood test (FOBT) of the stool. You may have this test every year starting at age 60. Flexible sigmoidoscopy or colonoscopy. You may have a sigmoidoscopy every 5 years or a colonoscopy every 10 years starting at age 29. Prostate cancer screening. Recommendations will vary depending on your family history and other risks. Hepatitis C blood test. Hepatitis B blood test. Sexually transmitted disease (STD) testing. Diabetes screening. This is done by  checking your blood sugar (glucose) after you have not eaten for a while (fasting). You may have this done every 1-3 years. Abdominal aortic aneurysm (AAA) screening. You may need this if you are a current or former smoker. Osteoporosis. You may be screened starting at age 76 if you are at high risk. Talk with your health care provider about your test results, treatment options, and if necessary, the need for more tests. Vaccines  Your health care provider may recommend certain vaccines, such as: Influenza vaccine. This is recommended every year. Tetanus, diphtheria, and acellular pertussis (Tdap, Td) vaccine. You may need a Td booster every 10 years. Zoster vaccine. You may need this after age 52. Pneumococcal 13-valent conjugate (PCV13) vaccine. One dose is recommended after age 110. Pneumococcal polysaccharide (PPSV23) vaccine. One dose is recommended after age 93. Talk to your health care provider about which screenings and vaccines you need and how often you need them. This information is not intended to replace advice given to you by your health care provider. Make sure you discuss any questions you have with your health care provider. Document Released: 11/06/2015 Document Revised: 06/29/2016 Document Reviewed: 08/11/2015 Elsevier Interactive Patient Education  2017 [employer].  Fall Prevention in the Home Falls can cause injuries. They can happen to people of all ages. There are many things you can do to make your home safe and to help prevent falls. What can I do on the outside of my home? Regularly fix the edges of walkways and driveways and fix any cracks. Remove anything that might make you trip as you walk through a door, such as a raised step or threshold. Trim any bushes or trees on the path to your home. Use bright outdoor lighting. Clear any walking paths of anything that might make someone trip, such as rocks or tools. Regularly check to see if handrails are loose or  broken. Make sure that both sides of any steps have handrails. Any raised decks and porches should have guardrails on the edges. Have any leaves, snow, or ice cleared regularly. Use sand or salt on walking paths during winter. Clean up any spills in your garage right away. This includes oil or grease spills. What can I do in the bathroom? Use night lights. Install grab bars by the toilet and in the tub and shower. Do not use towel bars as grab bars. Use non-skid mats or decals in  the tub or shower. If you need to sit down in the shower, use a plastic, non-slip stool. Keep the floor dry. Clean up any water that spills on the floor as soon as it happens. Remove soap buildup in the tub or shower regularly. Attach bath mats securely with double-sided non-slip rug tape. Do not have throw rugs and other things on the floor that can make you trip. What can I do in the bedroom? Use night lights. Make sure that you have a light by your bed that is easy to reach. Do not use any sheets or blankets that are too big for your bed. They should not hang down onto the floor. Have a firm chair that has side arms. You can use this for support while you get dressed. Do not have throw rugs and other things on the floor that can make you trip. What can I do in the kitchen? Clean up any spills right away. Avoid walking on wet floors. Keep items that you use a lot in easy-to-reach places. If you need to reach something above you, use a strong step stool that has a grab bar. Keep electrical cords out of the way. Do not use floor polish or wax that makes floors slippery. If you must use wax, use non-skid floor wax. Do not have throw rugs and other things on the floor that can make you trip. What can I do with my stairs? Do not leave any items on the stairs. Make sure that there are handrails on both sides of the stairs and use them. Fix handrails that are broken or loose. Make sure that handrails are as long as  the stairways. Check any carpeting to make sure that it is firmly attached to the stairs. Fix any carpet that is loose or worn. Avoid having throw rugs at the top or bottom of the stairs. If you do have throw rugs, attach them to the floor with carpet tape. Make sure that you have a light switch at the top of the stairs and the bottom of the stairs. If you do not have them, ask someone to add them for you. What else can I do to help prevent falls? Wear shoes that: Do not have high heels. Have rubber bottoms. Are comfortable and fit you well. Are closed at the toe. Do not wear sandals. If you use a stepladder: Make sure that it is fully opened. Do not climb a closed stepladder. Make sure that both sides of the stepladder are locked into place. Ask someone to hold it for you, if possible. Clearly mark and make sure that you can see: Any grab bars or handrails. First and last steps. Where the edge of each step is. Use tools that help you move around (mobility aids) if they are needed. These include: Canes. Walkers. Scooters. Crutches. Turn on the lights when you go into a dark area. Replace any light bulbs as soon as they burn out. Set up your furniture so you have a clear path. Avoid moving your furniture around. If any of your floors are uneven, fix them. If there are any pets around you, be aware of where they are. Review your medicines with your doctor. Some medicines can make you feel dizzy. This can increase your chance of falling. Ask your doctor what other things that you can do to help prevent falls. This information is not intended to replace advice given to you by your health care provider. Make sure you discuss any  questions you have with your health care provider. Document Released: 08/06/2009 Document Revised: 03/17/2016 Document Reviewed: 11/14/2014 Elsevier Interactive Patient Education  2017 [employer].

## 2023-03-31 NOTE — Progress Notes (Addendum)
Subjective:   Robert [name] is a 72 y.o. male who presents for Medicare Annual/Subsequent preventive examination.  Review of Systems    Virtual Visit via Telephone Note  I connected with  Robert [name] on 03/31/23 at 10:45 AM EDT by telephone and verified that I am speaking with the correct person using two identifiers.  Location: Patient: Home Provider: Office Persons participating in the virtual visit: patient/Nurse Health Advisor   I discussed the limitations, risks, security and privacy concerns of performing an evaluation and management service by telephone and the availability of in person appointments. The patient expressed understanding and agreed to proceed.  Interactive audio and video telecommunications were attempted between this nurse and patient, however failed, due to patient having technical difficulties OR patient did not have access to video capability.  We continued and completed visit with audio only.  Some vital signs may be absent or patient reported.   Tillie Rung, LPN  Cardiac Risk Factors include: advanced age (>32men, >35 women);male gender;hypertension;diabetes mellitus     Objective:    Today's Vitals   03/31/23 1045 03/31/23 1048  Weight: 212 lb (96.2 kg)   Height: 6' 3.6" (1.92 m)   PainSc:  0-No pain   Body mass index is 26.08 kg/m.     03/31/2023   10:55 AM 12/02/2022   12:33 PM 11/21/2022    2:31 PM 04/21/2022    3:08 PM 03/15/2022    9:20 AM 06/18/2021    8:40 AM 06/03/2018   10:20 PM  Advanced Directives  Does Patient Have a Medical Advance Directive? Yes Yes Yes Yes Yes Yes No  Type of Estate agent of [place];Living will Healthcare Power of [place];Living will Healthcare Power of [place];Living will Living will;Healthcare Power of Attorney     Does patient want to make changes to medical advance directive?  No - Patient declined  No - Patient declined No - Patient declined Yes  (MAU/Ambulatory/Procedural Areas - Information given)   Copy of Healthcare Power of Attorney in Chart? No - copy requested No - copy requested No - copy requested No - copy requested       Current Medications (verified) Outpatient Encounter Medications as of 03/31/2023  Medication Sig   acetaminophen (TYLENOL) 500 MG tablet Take 1,000 mg by mouth every 6 (six) hours as needed.   apixaban (ELIQUIS) 5 MG TABS tablet Take 1 tablet (5 mg total) by mouth 2 (two) times daily.   atorvastatin (LIPITOR) 40 MG tablet TAKE ONE TABLET BY MOUTH ONCE DAILY   fluticasone (FLONASE) 50 MCG/ACT nasal spray Place 1 spray into both nostrils daily as needed for allergies or rhinitis.   glipiZIDE (GLUCOTROL) 5 MG tablet TAKE 1/2 TABLET BY MOUTH EVERY EVENING   glucose blood test strip 1 each by Other route daily. Use daily for glucose control - Dx 250.00   hydrochlorothiazide (MICROZIDE) 12.5 MG capsule Take 1 capsule (12.5 mg total) by mouth daily.   lisinopril (ZESTRIL) 40 MG tablet Take 1 tablet (40 mg total) by mouth daily.   metFORMIN (GLUCOPHAGE-XR) 500 MG 24 hr tablet Take 4 tablets (2,000 mg total) by mouth daily with supper.   metoprolol succinate (TOPROL-XL) 25 MG 24 hr tablet TAKE ONE TABLET BY MOUTH ONCE DAILY   Multiple Vitamins-Minerals (EMERGEN-C VITAMIN C) PACK Take 1 packet by mouth daily as needed (immune support).   Multiple Vitamins-Minerals (ZINC PO) Take 1 tablet by mouth daily as needed (immune support).   pembrolizumab (  KEYTRUDA) 100 MG/4ML SOLN Inject 2 mg/kg into the vein.   propranolol (INDERAL) 10 MG tablet TAKE 1 TABLET BY MOUTH 4 TIMES DAILY AS NEEDED FOR PALPITATIONS, IRREGULAR OR FAST HEART RATE   Semaglutide, 1 MG/DOSE, 4 MG/3ML SOPN Inject 1 mg as directed once a week.   sildenafil (REVATIO) 20 MG tablet TAKE 1 TO 5 TABLETS 2 TO 3 HOURS BEFORE SEX   tamsulosin (FLOMAX) 0.4 MG CAPS capsule Take 0.4 mg by mouth at bedtime.   traMADol (ULTRAM) 50 MG tablet Take 1 tablet (50 mg total)  by mouth every 6 (six) hours as needed for severe pain.   No facility-administered encounter medications on file as of 03/31/2023.    Allergies (verified) Patient has no known allergies.   History: Past Medical History:  Diagnosis Date   Anticoagulant long-term use    eliquis--- managed by cardiology   Bladder cancer [facility])    urologist--- dr Mena Goes;  overlapping   History of basal cell carcinoma (BCC) excision    History of COVID-19 05/2019   per pt mild symptoms that resolved   HLD (hyperlipidemia)    HTN (hypertension)    Hx of colonic polyps    Insulin dependent type 2 diabetes mellitus [facility])    endocrinologist--- dr Elvera Lennox --  (03-11-2022 per pt check blood sugar 1-2 times daily,  fasting sugar-- 118-120s)   Nocturia    Paroxysmal atrial fibrillation (HCC) 03/12/2019   cardiologist--- dr Melburn Popper   Ureterocele    Past Surgical History:  Procedure Laterality Date   BLEPHAROPLASTY Bilateral    per pt approx  2001;   upper eyelid's   COLONOSCOPY     last one approx 2020   CYSTOSCOPY WITH URETEROSCOPY AND STENT PLACEMENT Right 12/02/2022   Procedure: CYSTOSCOPY WITH RIGHT URETEROSCOPY, RIGHT DIAGNOSTIC URETEROSCOPY;  Surgeon: Jerilee Field, MD;  Location: [facility];  Service: Urology;  Laterality: Right;  90 MINS FOR CASE   PATELLAR TENDON REPAIR Left 04/16/2004   @[facility]    TRANSURETHRAL RESECTION OF BLADDER TUMOR N/A 06/18/2021   Procedure: TRANSURETHRAL RESECTION OF BLADDER TUMOR (TURBT) WITH RIGHT URETERAL STENT PLACEMENT, RIGHT URETEROSCOPY WITH DISTRACTION OF TUMOR, FULGURATION;  Surgeon: Jerilee Field, MD;  Location: [facility];  Service: Urology;  Laterality: N/A;   TRANSURETHRAL RESECTION OF BLADDER TUMOR N/A 03/15/2022   Procedure: TRANSURETHRAL RESECTION OF BLADDER TUMOR (TURBT) WITH BILATERAL URETEROSCOPY/URETHRAL/ DILATION/BILATERAL RETROGRADE PYELOGRAM/BIOPSY AND FULGURATION OF RIGHT URETERAL CANCER/RIGHT STENT PLACEMENT;  Surgeon: Jerilee Field,  MD;  Location[facility];  Service: Urology;  Laterality: N/A;   TRANSURETHRAL RESECTION OF BLADDER TUMOR WITH MITOMYCIN-C N/A 12/02/2022   Procedure: TRANSURETHRAL RESECTION OF BLADDER TUMOR;  Surgeon: Jerilee Field, MD;  Location: [facility];  Service: Urology;  Laterality: N/A;   Family History  Problem Relation Age of Onset   Pancreatic cancer Mother 24   Cancer Paternal Uncle        unknown type cancer   Aortic aneurysm Other        family history   Cancer Other        Breast (1st degree relative)   Colon cancer Neg Hx    Rectal cancer Neg Hx    Stomach cancer Neg Hx    Esophageal cancer Neg Hx    Social History   Socioeconomic History   Marital status: Married    Spouse name: Not on file   Number of children: 1   Years of education: Not on file   Highest education level: Not  on file  Occupational History   Occupation: DVD's   Occupation: Occupational hygienist: Self Employed  Tobacco Use   Smoking status: Some Days    Types: Cigars    Last attempt to quit: 02/22/2011    Years since quitting: 12.1   Smokeless tobacco: Never  Vaping Use   Vaping Use: Never used  Substance and Sexual Activity   Alcohol use: Yes    Alcohol/week: 0.0 standard drinks of alcohol    Comment: 6-8oz /wk   Drug use: Never   Sexual activity: Not on file  Other Topics Concern   Not on file  Social History Narrative   Patient is married he has been employed in Nurse, children's   Former smoker with occasional alcohol use   Social Determinants of Health   Financial Resource Strain: Low Risk  (03/31/2023)   Overall Financial Resource Strain (CARDIA)    Difficulty of Paying Living Expenses: Not hard at all  Food Insecurity: No Food Insecurity (03/31/2023)   Hunger Vital Sign    Worried About Running Out of Food in the Last Year: Never true    Ran Out of Food in the Last Year: Never true  Transportation Needs: Patient Declined (02/23/2023)   PRAPARE - Transportation     Lack of Transportation (Medical): Patient declined    Lack of Transportation (Non-Medical): Patient declined  Physical Activity: Inactive (03/31/2023)   Exercise Vital Sign    Days of Exercise per Week: 0 days    Minutes of Exercise per Session: 0 min  Stress: No Stress Concern Present (03/31/2023)   [employer] of Occupational Health - Occupational Stress Questionnaire    Feeling of Stress : Not at all  Social Connections: Socially Integrated (03/31/2023)   Social Connection and Isolation Panel [NHANES]    Frequency of Communication with Friends and Family: More than three times a week    Frequency of Social Gatherings with Friends and Family: More than three times a week    Attends Religious Services: More than 4 times per year    Active Member of [employer] or Organizations: Yes    Attends Engineer, structural: More than 4 times per year    Marital Status: Married    Tobacco Counseling Ready to quit: Yes Counseling given: Yes   Clinical Intake:  Pre-visit preparation completed: No  Pain : No/denies pain Pain Score: 0-No pain   Nutrition Risk Assessment:  Has the patient had any N/V/D within the last 2 months?  No  Does the patient have any non-healing wounds?  No  Has the patient had any unintentional weight loss or weight gain?  No   Diabetes:  Is the patient diabetic?  Yes  If diabetic, was a CBG obtained today?  No  Did the patient bring in their glucometer from home?  No  How often do you monitor your CBG's? 2 x daily.   Financial Strains and Diabetes Management:  Are you having any financial strains with the device, your supplies or your medication? No .  Does the patient want to be seen by Chronic Care Management for management of their diabetes?  No  Would the patient like to be referred to a Nutritionist or for Diabetic Management?  No   Diabetic Exams:  Diabetic Eye Exam: Completed  . Overdue for diabetic eye exam. Pt has been advised about the  importance in completing this exam. A referral has been placed today. Message sent to referral  coordinator for scheduling purposes. Advised pt to expect a call from office referred to regarding appt.  Diabetic Foot Exam: Completed  . Pt has been advised about the importance in completing this exam. Pt is scheduled for diabetic foot exam on Followed by Dr Elvera Lennox.    BMI - recorded: 26.08 Nutritional Status: BMI 25 -29 Overweight Nutritional Risks: None Diabetes: Yes CBG done?: No Did pt. bring in CBG monitor from home?: No  How often do you need to have someone help you when you read instructions, pamphlets, or other written materials from your doctor or pharmacy?: 1 - Never  Diabetic?  Yes  Interpreter Needed?: No  Information entered by :: Theresa Mulligan LPN   Activities of Daily Living    03/31/2023   10:53 AM 11/21/2022    2:32 PM  In your present state of health, do you have any difficulty performing the following activities:  Hearing? 0   Vision? 0   Difficulty concentrating or making decisions? 0   Walking or climbing stairs? 0   Dressing or bathing? 0   Doing errands, shopping? 0 0  Preparing Food and eating ? N   Using the Toilet? N   In the past six months, have you accidently leaked urine? Y   Comment Wears depends. Followed by Urologist   Do you have problems with loss of bowel control? N   Managing your Medications? N   Managing your Finances? N   Housekeeping or managing your Housekeeping? N     Patient Care Team: Plotnikov, [name], MD as PCP - General (Internal Medicine) Nahser, Deloris Ping, MD as PCP - Cardiology (Cardiology) Verner Chol, [facility] (Inactive) as Pharmacist (Pharmacist) Carlus Pavlov, MD as Consulting Physician (Internal Medicine) Jerilee Field, MD as Consulting Physician (Urology) Iva Boop, MD as Consulting Physician (Gastroenterology) Janet Berlin, MD as Consulting Physician (Ophthalmology) Malachy Mood, MD as Attending  Physician (Hematology and Oncology)  Indicate any recent Medical Services you may have received from other than [facility] providers in the past year (date may be approximate).     Assessment:   This is a routine wellness examination for Dyshon.  Hearing/Vision screen Hearing Screening - Comments:: Denies hearing difficulties   Vision Screening - Comments:: Wears rx glasses - up to date with routine eye exams with  Maggie Schwalbe  Dietary issues and exercise activities discussed: Current Exercise Habits: The patient does not participate in regular exercise at present, Exercise limited by: orthopedic condition(s)   Goals Addressed               This Visit's Progress     Get healthy (pt-stated)        Stay Healthy.       Depression Screen    03/31/2023   10:52 AM 02/27/2023    3:02 PM 04/21/2022    3:06 PM 01/31/2022    2:08 PM 04/20/2021    9:51 AM 02/06/2020    9:55 AM 07/23/2018   11:37 AM  PHQ 2/9 Scores  PHQ - 2 Score 0 0 0 0 0 4 0  PHQ- 9 Score     0 5     Fall Risk    03/31/2023   10:54 AM 02/27/2023    3:01 PM 04/21/2022    3:09 PM 01/31/2022    2:08 PM 04/20/2021    9:51 AM  Fall Risk   Falls in the past year? 0 0 0 0 0  Number falls in past yr: 0 0  0 0   Injury with Fall? 0 0 0 0   Risk for fall due to : No Fall Risks No Fall Risks No Fall Risks No Fall Risks   Follow up Falls prevention discussed Falls evaluation completed Falls evaluation completed Falls evaluation completed     FALL RISK PREVENTION PERTAINING TO THE HOME:  Any stairs in or around the home? Yes  If so, are there any without handrails? No  Home free of loose throw rugs in walkways, pet beds, electrical cords, etc? Yes  Adequate lighting in your home to reduce risk of falls? Yes   ASSISTIVE DEVICES UTILIZED TO PREVENT FALLS:  Life alert? No  Use of a cane, walker or w/c? Yes  Grab bars in the bathroom? No  Shower chair or bench in shower? No  Elevated toilet seat or a handicapped toilet?  No   TIMED UP AND GO:  Was the test performed? No . Audio Visit   Cognitive Function:        03/31/2023   10:55 AM 04/21/2022    3:12 PM  6CIT Screen  What Year? 0 points 0 points  What month? 0 points 0 points  What time? 0 points 0 points  Count back from 20 0 points 0 points  Months in reverse 0 points 0 points  Repeat phrase 0 points 0 points  Total Score 0 points 0 points    Immunizations Immunization History  Administered Date(s) Administered   Fluad Quad(high Dose 65+) 07/02/2019, 08/07/2020, 08/09/2021   Influenza Split 09/26/2011, 11/26/2012   Influenza Whole 08/21/2007   Influenza, High Dose Seasonal PF 07/10/2017, 07/23/2018   Influenza,inj,Quad PF,6+ Mos 08/06/2013, 07/28/2015   PFIZER(Purple Top)SARS-COV-2 Vaccination 01/24/2020, 02/17/2020, 09/26/2020   Pneumococcal Conjugate-13 03/01/2017   Pneumococcal Polysaccharide-23 07/23/2018   Td 08/21/2007   Zoster, Live 02/05/2013    TDAP status: Due, Education has been provided regarding the importance of this vaccine. Advised may receive this vaccine at local pharmacy or Health Dept. Aware to provide a copy of the vaccination record if obtained from local pharmacy or Health Dept. Verbalized acceptance and understanding.  Flu Vaccine status: Up to date  Pneumococcal vaccine status: Up to date  Covid-19 vaccine status: Completed vaccines   Screening Tests Health Maintenance  Topic Date Due   DTaP/Tdap/Td (2 - Tdap) 08/20/2017   OPHTHALMOLOGY EXAM  12/25/2022   FOOT EXAM  03/29/2023   Diabetic kidney evaluation - Urine ACR  04/01/2023 (Originally 01/28/2022)   COVID-19 Vaccine (4 - 2023-24 season) 04/16/2023 (Originally 06/24/2022)   Hepatitis C Screening  03/30/2024 (Originally 08/22/1969)   INFLUENZA VACCINE  05/25/2023   HEMOGLOBIN A1C  08/05/2023   Diabetic kidney evaluation - eGFR measurement  03/15/2024   Medicare Annual Wellness (AWV)  03/30/2024   Colonoscopy  06/18/2027   Pneumonia Vaccine 46+  Years old  Completed   HPV VACCINES  Aged Out   Zoster Vaccines- Shingrix  Discontinued    Health Maintenance  Health Maintenance Due  Topic Date Due   DTaP/Tdap/Td (2 - Tdap) 08/20/2017   OPHTHALMOLOGY EXAM  12/25/2022   FOOT EXAM  03/29/2023    Colorectal cancer screening: Type of screening: Colonoscopy. Completed 06/17/22. Repeat every 5 years  Lung Cancer Screening: (Low Dose CT Chest recommended if Age 45-80 years, 30 pack-year currently smoking OR have quit w/in 15years.) does qualify.   Lung Cancer Screening Referral: Deferred  Additional Screening:  Hepatitis C Screening: does qualify; Deferred  Vision Screening: Recommended annual ophthalmology exams for early  detection of glaucoma and other disorders of the eye. Is the patient up to date with their annual eye exam?  Yes  Who is the provider or what is the name of the office in which the patient attends annual eye exams? [facility] If pt is not established with a provider, would they like to be referred to a provider to establish care? No .   Dental Screening: Recommended annual dental exams for proper oral hygiene  Community Resource Referral / Chronic Care Management:  CRR required this visit?  No   CCM required this visit?  No      Plan:     I have personally reviewed and noted the following in the patient's chart:   Medical and social history Use of alcohol, tobacco or illicit drugs  Current medications and supplements including opioid prescriptions. Patient is currently taking opioid prescriptions. Information provided to patient regarding non-opioid alternatives. Patient advised to discuss non-opioid treatment plan with their provider. Functional ability and status Nutritional status Physical activity Advanced directives List of other physicians Hospitalizations, surgeries, and ER visits in previous 12 months Vitals Screenings to include cognitive, depression, and falls Referrals and  appointments  In addition, I have reviewed and discussed with patient certain preventive protocols, quality metrics, and best practice recommendations. A written personalized care plan for preventive services as well as general preventive health recommendations were provided to patient.     Tillie Rung, LPN   02/23/8412   Nurse Notes:   Patient due Hep-C Screening and Diabetic kidney evaluation-Urine ACR  Medical screening examination/treatment/procedure(s) were performed by non-physician practitioner and as supervising physician I was immediately available for consultation/collaboration.  I agree with above. Jacinta Shoe, MD

## 2023-04-01 ENCOUNTER — Other Ambulatory Visit: Payer: Self-pay

## 2023-04-03 ENCOUNTER — Other Ambulatory Visit: Payer: Self-pay | Admitting: Urology

## 2023-04-03 ENCOUNTER — Telehealth: Payer: Self-pay

## 2023-04-03 ENCOUNTER — Telehealth: Payer: Self-pay | Admitting: Cardiovascular Disease

## 2023-04-03 NOTE — Telephone Encounter (Signed)
Patient with diagnosis of afib on Eliquis for anticoagulation.    Procedure: bladder biopsy and transurethral resection of the bladder tumor  Date of procedure: 05/05/23   CHA2DS2-VASc Score = 3  This indicates a 3.2% annual risk of stroke. The patient's score is based upon: CHF History: 0 HTN History: 1 Diabetes History: 1 Stroke History: 0 Vascular Disease History: 0 Age Score: 1 Gender Score: 0  CrCl 26mL/min Platelet count 190K  Per office protocol, patient can hold Eliquis for 2 days prior to procedure as requested.    **This guidance is not considered finalized until pre-operative APP has relayed final recommendations.**

## 2023-04-03 NOTE — Telephone Encounter (Signed)
   Name: Robert [name]  DOB: 24-Aug-1951  MRN: [id number]  Primary Cardiologist: [name], MD  Chart reviewed as part of pre-operative protocol coverage. Because of [name]'s past medical history and time since last visit, he will require a follow-up telephone visit in order to better assess preoperative cardiovascular risk.  Pre-op covering staff: - Please schedule appointment and call patient to inform them. If patient already had an upcoming appointment within acceptable timeframe, please add "pre-op clearance" to the appointment notes so provider is aware. - Please contact requesting surgeon's office via preferred method (i.e, phone, fax) to inform them of need for appointment prior to surgery.   Per office protocol, patient can hold Eliquis for 2 days prior to procedure as requested.      Sharlene Dory, PA-C  04/03/2023, 4:42 PM

## 2023-04-03 NOTE — Telephone Encounter (Signed)
     Pre-operative Risk Assessment    Patient Name: Robert [name]  DOB: [birth year] MRN: [id number]      Request for Surgical Clearance    Procedure:   bladder biopsy and transurethral resection of the bladder tumor   Date of Surgery:  Clearance 05/05/23                                 Surgeon:  Dr. Mena Goes  Surgeon's Group or Practice Name:  [facility] Phone number:  [phone] Fax number:  [phone]   Type of Clearance Requested:   - Medical  - Pharmacy:  Hold Apixaban (Eliquis) 2 days   Type of Anesthesia:  General    Additional requests/questions:    Signed, Noe Gens   04/03/2023, 11:58 AM

## 2023-04-03 NOTE — Telephone Encounter (Signed)
Pt is scheduled 07/01 at 9:20am. Consent done    Patient Consent for Virtual Visit        Robert [name] has provided verbal consent on 04/03/2023 for a virtual visit (video or telephone).   CONSENT FOR VIRTUAL VISIT FOR:  Robert [name]  By participating in this virtual visit I agree to the following:  I hereby voluntarily request, consent and authorize [facility] and its employed or contracted physicians, physician assistants, nurse practitioners or other licensed health care professionals (the Practitioner), to provide me with telemedicine health care services (the "Services") as deemed necessary by the treating Practitioner. I acknowledge and consent to receive the Services by the Practitioner via telemedicine. I understand that the telemedicine visit will involve communicating with the Practitioner through live audiovisual communication technology and the disclosure of certain medical information by electronic transmission. I acknowledge that I have been given the opportunity to request an in-person assessment or other available alternative prior to the telemedicine visit and am voluntarily participating in the telemedicine visit.  I understand that I have the right to withhold or withdraw my consent to the use of telemedicine in the course of my care at any time, without affecting my right to future care or treatment, and that the Practitioner or I may terminate the telemedicine visit at any time. I understand that I have the right to inspect all information obtained and/or recorded in the course of the telemedicine visit and may receive copies of available information for a reasonable fee.  I understand that some of the potential risks of receiving the Services via telemedicine include:  Delay or interruption in medical evaluation due to technological equipment failure or disruption; Information transmitted may not be sufficient (e.g. poor resolution of images) to  allow for appropriate medical decision making by the Practitioner; and/or  In rare instances, security protocols could fail, causing a breach of personal health information.  Furthermore, I acknowledge that it is my responsibility to provide information about my medical history, conditions and care that is complete and accurate to the best of my ability. I acknowledge that Practitioner's advice, recommendations, and/or decision may be based on factors not within their control, such as incomplete or inaccurate data provided by me or distortions of diagnostic images or specimens that may result from electronic transmissions. I understand that the practice of medicine is not an exact science and that Practitioner makes no warranties or guarantees regarding treatment outcomes. I acknowledge that a copy of this consent can be made available to me via my patient portal [facility] MyChart), or I can request a printed copy by calling the office of [facility].    I understand that my insurance will be billed for this visit.   I have read or had this consent read to me. I understand the contents of this consent, which adequately explains the benefits and risks of the Services being provided via telemedicine.  I have been provided ample opportunity to ask questions regarding this consent and the Services and have had my questions answered to my satisfaction. I give my informed consent for the services to be provided through the use of telemedicine in my medical care

## 2023-04-03 NOTE — Telephone Encounter (Signed)
Pt is scheduled 07/01 at 9:20am. Consent done

## 2023-04-06 ENCOUNTER — Other Ambulatory Visit: Payer: Self-pay

## 2023-04-06 ENCOUNTER — Inpatient Hospital Stay: Payer: Medicare HMO

## 2023-04-06 ENCOUNTER — Encounter: Payer: Self-pay | Admitting: Hematology

## 2023-04-06 ENCOUNTER — Inpatient Hospital Stay (HOSPITAL_BASED_OUTPATIENT_CLINIC_OR_DEPARTMENT_OTHER): Payer: Medicare HMO | Admitting: Hematology

## 2023-04-06 ENCOUNTER — Inpatient Hospital Stay: Payer: Medicare HMO | Attending: Hematology

## 2023-04-06 VITALS — BP 136/87 | HR 94 | Temp 98.2°F | Resp 18 | Ht 75.6 in | Wt 207.7 lb

## 2023-04-06 DIAGNOSIS — C679 Malignant neoplasm of bladder, unspecified: Secondary | ICD-10-CM | POA: Diagnosis not present

## 2023-04-06 DIAGNOSIS — Z125 Encounter for screening for malignant neoplasm of prostate: Secondary | ICD-10-CM | POA: Diagnosis not present

## 2023-04-06 DIAGNOSIS — Z5112 Encounter for antineoplastic immunotherapy: Secondary | ICD-10-CM | POA: Insufficient documentation

## 2023-04-06 DIAGNOSIS — R972 Elevated prostate specific antigen [PSA]: Secondary | ICD-10-CM | POA: Insufficient documentation

## 2023-04-06 LAB — CMP (CANCER CENTER ONLY)
ALT: 12 U/L (ref 0–44)
AST: 14 U/L — ABNORMAL LOW (ref 15–41)
Albumin: 4 g/dL (ref 3.5–5.0)
Alkaline Phosphatase: 74 U/L (ref 38–126)
Anion gap: 7 (ref 5–15)
BUN: 15 mg/dL (ref 8–23)
CO2: 24 mmol/L (ref 22–32)
Calcium: 9.7 mg/dL (ref 8.9–10.3)
Chloride: 105 mmol/L (ref 98–111)
Creatinine: 0.79 mg/dL (ref 0.61–1.24)
GFR, Estimated: >60 mL/min (ref >60)
Glucose, Bld: 224 mg/dL — ABNORMAL HIGH (ref 70–99)
Potassium: 4.2 mmol/L (ref 3.5–5.1)
Sodium: 136 mmol/L (ref 135–145)
Total Bilirubin: 0.5 mg/dL (ref 0.3–1.2)
Total Protein: 7.2 g/dL (ref 6.5–8.1)

## 2023-04-06 LAB — CBC WITH DIFFERENTIAL (CANCER CENTER ONLY)
Abs Immature Granulocytes: 0.06 10*3/uL (ref 0.00–0.07)
Basophils Absolute: 0.1 10*3/uL (ref 0.0–0.1)
Basophils Relative: 1 %
Eosinophils Absolute: 0 10*3/uL (ref 0.0–0.5)
Eosinophils Relative: 0 %
HCT: 46.6 % (ref 39.0–52.0)
Hemoglobin: 16.6 g/dL (ref 13.0–17.0)
Immature Granulocytes: 1 %
Lymphocytes Relative: 24 %
Lymphs Abs: 2.3 10*3/uL (ref 0.7–4.0)
MCH: 32 pg (ref 26.0–34.0)
MCHC: 35.6 g/dL (ref 30.0–36.0)
MCV: 89.8 fL (ref 80.0–100.0)
Monocytes Absolute: 0.9 10*3/uL (ref 0.1–1.0)
Monocytes Relative: 9 %
Neutro Abs: 6.2 10*3/uL (ref 1.7–7.7)
Neutrophils Relative %: 65 %
Platelet Count: 195 10*3/uL (ref 150–400)
RBC: 5.19 MIL/uL (ref 4.22–5.81)
RDW: 13.1 % (ref 11.5–15.5)
WBC Count: 9.6 10*3/uL (ref 4.0–10.5)
nRBC: 0 % (ref 0.0–0.2)

## 2023-04-06 MED ORDER — SODIUM CHLORIDE 0.9 % IV SOLN: Freq: Once | INTRAVENOUS | Status: AC

## 2023-04-06 MED ORDER — SODIUM CHLORIDE 0.9 % IV SOLN
400.0000 mg | Freq: Once | INTRAVENOUS | Status: AC
  Administered 2023-04-06: 400 mg via INTRAVENOUS
  Filled 2023-04-06: qty 16

## 2023-04-06 NOTE — Patient Instructions (Signed)
[facility]  Discharge Instructions: Thank you for choosing [facility] to provide your oncology and hematology care.   If you have a lab appointment with the Cancer Center, please go directly to the Cancer Center and check in at the registration area.   Wear comfortable clothing and clothing appropriate for easy access to any Portacath or PICC line.   We strive to give you quality time with your provider. You may need to reschedule your appointment if you arrive late (15 or more minutes).  Arriving late affects you and other patients whose appointments are after yours.  Also, if you miss three or more appointments without notifying the office, you may be dismissed from the clinic at the provider's discretion.      For prescription refill requests, have your pharmacy contact our office and allow 72 hours for refills to be completed.    Today you received the following chemotherapy and/or immunotherapy agents: Keytruda      To help prevent nausea and vomiting after your treatment, we encourage you to take your nausea medication as directed.  BELOW ARE SYMPTOMS THAT SHOULD BE REPORTED IMMEDIATELY: *FEVER GREATER THAN 100.4 F (38 C) OR HIGHER *CHILLS OR SWEATING *NAUSEA AND VOMITING THAT IS NOT CONTROLLED WITH YOUR NAUSEA MEDICATION *UNUSUAL SHORTNESS OF BREATH *UNUSUAL BRUISING OR BLEEDING *URINARY PROBLEMS (pain or burning when urinating, or frequent urination) *BOWEL PROBLEMS (unusual diarrhea, constipation, pain near the anus) TENDERNESS IN MOUTH AND THROAT WITH OR WITHOUT PRESENCE OF ULCERS (sore throat, sores in mouth, or a toothache) UNUSUAL RASH, SWELLING OR PAIN  UNUSUAL VAGINAL DISCHARGE OR ITCHING   Items with * indicate a potential emergency and should be followed up as soon as possible or go to the Emergency Department if any problems should occur.  Please show the CHEMOTHERAPY ALERT CARD or IMMUNOTHERAPY ALERT CARD at  check-in to the Emergency Department and triage nurse.  Should you have questions after your visit or need to cancel or reschedule your appointment, please contact [facility]  Dept: [phone]  and follow the prompts.  Office hours are 8:00 a.m. to 4:30 p.m. Monday - Friday. Please note that voicemails left after 4:00 p.m. may not be returned until the following business day.  We are closed weekends and major holidays. You have access to a nurse at all times for urgent questions. Please call the main number to the clinic Dept: [phone] and follow the prompts.   For any non-urgent questions, you may also contact your provider using MyChart. We now offer e-Visits for anyone 18 and older to request care online for non-urgent symptoms. For details visit [email or web address].   Also download the MyChart app! Go to the app store, search "MyChart", open the app, select [facility], and log in with your MyChart username and password.   

## 2023-04-06 NOTE — Progress Notes (Signed)
[facility]   Telephone:(336) [phone] [phone]) [phone]   Clinic Follow up Note   Patient Care Team: Plotnikov, [name], MD as PCP - General (Internal Medicine) Nahser, Deloris Ping, MD as PCP - Cardiology (Cardiology) Verner Chol, [facility] (Inactive) as Pharmacist (Pharmacist) Carlus Pavlov, MD as Consulting Physician (Internal Medicine) Jerilee Field, MD as Consulting Physician (Urology) Iva Boop, MD as Consulting Physician (Gastroenterology) Janet Berlin, MD as Consulting Physician (Ophthalmology) Malachy Mood, MD as Attending Physician (Hematology and Oncology)  Date of Service:  04/06/2023  CHIEF COMPLAINT: f/u of bladder cancer   CURRENT THERAPY:  Keytruda   ASSESSMENT:  Robert [name] is a 72 y.o. 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 -He is clinically doing well, asymptomatic.  Surgical resection was discussed with patient, but he would like to try nonsurgical options. -I discussed intravesical therapy, such as gemcitabine or mitomycin, unfortunately it would not reach to the ureter and renal pelvis to treat all the disease that he has. -I recommend systemic therapy with immunotherapy pembrolizumab 200 mg every 3 weeks, or 400 mg every 6 weeks if he tolerates well.  This has been studied in the keynote 057 trial, it demonstrated 39% complete response in 3 months in BCG unresponsive high risk nonmuscle invasive bladder cancer. -He started Keytruda on 01/12/23, tolerated first cycle well except a mild fatigue.  No other noticeable side effects.  We again reviewed potential AE's, and what to watch.  He voices good understanding. -He will follow-up with urologist Dr. Lennox Laity for repeated cystoscopy and evaluation of his response to immunotherapy -His recent cystoscopy was suspicious  for cancer recurrence, he is scheduled to have biopsy -If biopsy confirms progression, will stop Keytruda.  He will discuss surgical options with Dr. Lennox Laity. -he is clinically doing well, will proceed with Keytruda 400mg  today, he understand this is higher dose and likely cause more fatigue, and he will watch other new symptoms  -phone visit in 8 weeks to review his bladder biopsy result and what's next   Elevated PSA -his screening PSA last month by PCP was elevated at 6.5 -will repeat today -f/u with Dr. Lennox Laity   PLAN: -Biopsy -pending -Proceed with Keytruda with increase dose  -cancel Treatment  for 7/25 -f/u phone visit in 2 months  SUMMARY OF ONCOLOGIC HISTORY: Oncology History Overview Note   Cancer Staging  Bladder cancer [facility]) Staging form: Urinary Bladder, AJCC 8th Edition - Clinical stage from 05/14/2021: Stage 0a (cTa, cN0, cM0) - Signed by Malachy Mood, MD on 01/02/2023 Stage prefix: Initial diagnosis WHO/ISUP grade (low/high): High Grade Histologic grading system: 2 grade system - Pathologic stage from 02/22/2022: Stage I (pT1, pN0, cM0) - Signed by Malachy Mood, MD on 01/02/2023 WHO/ISUP grade (low/high): High Grade Histologic grading system: 2 grade system     Bladder cancer (HCC)  05/14/2021 Cancer Staging   Staging form: Urinary Bladder, AJCC 8th Edition - Clinical stage from 05/14/2021: Stage 0a (cTa, cN0, cM0) - Signed by Malachy Mood, MD on 01/02/2023 Stage prefix: Initial diagnosis WHO/ISUP grade (low/high): High Grade Histologic grading system: 2 grade system   01/31/2022 Initial Diagnosis   Bladder cancer (HCC)   02/22/2022 Cancer Staging   Staging form: Urinary Bladder, AJCC 8th Edition - Pathologic stage from 02/22/2022: Stage I (pT1, pN0, cM0) - Signed by Malachy Mood, MD on 01/02/2023  WHO/ISUP grade (low/high): High Grade Histologic grading system: 2 grade system   01/12/2023 -  Chemotherapy   Patient is on Treatment Plan : BLADDER Pembrolizumab (200) q21d         INTERVAL HISTORY:  Robert [name] is here for a follow up of Recurrent bladder cancer. He was last seen by me on 03/16/2023. He presents to the clinic alone. Pt state that he has frequent urination and with his hip issue it , slows him down. H e goes about 3-4 time through out the night.  All other systems were reviewed with the patient and are negative.  MEDICAL HISTORY:  Past Medical History:  Diagnosis Date   Anticoagulant long-term use    eliquis--- managed by cardiology   Bladder cancer [facility])    urologist--- dr Mena Goes;  overlapping   History of basal cell carcinoma (BCC) excision    History of COVID-19 05/2019   per pt mild symptoms that resolved   HLD (hyperlipidemia)    HTN (hypertension)    Hx of colonic polyps    Insulin dependent type 2 diabetes mellitus [facility])    endocrinologist--- dr Elvera Lennox --  (03-11-2022 per pt check blood sugar 1-2 times daily,  fasting sugar-- 118-120s)   Nocturia    Paroxysmal atrial fibrillation (HCC) 03/12/2019   cardiologist--- dr Melburn Popper   Ureterocele     SURGICAL HISTORY: Past Surgical History:  Procedure Laterality Date   BLEPHAROPLASTY Bilateral    per pt approx  2001;   upper eyelid's   COLONOSCOPY     last one approx 2020   CYSTOSCOPY WITH URETEROSCOPY AND STENT PLACEMENT Right 12/02/2022   Procedure: CYSTOSCOPY WITH RIGHT URETEROSCOPY, RIGHT DIAGNOSTIC URETEROSCOPY;  Surgeon: Jerilee Field, MD;  Location: [facility];  Service: Urology;  Laterality: Right;  90 MINS FOR CASE   PATELLAR TENDON REPAIR Left 04/16/2004   @[facility]    TRANSURETHRAL RESECTION OF BLADDER TUMOR N/A 06/18/2021   Procedure: TRANSURETHRAL RESECTION OF BLADDER TUMOR (TURBT) WITH RIGHT URETERAL STENT PLACEMENT, RIGHT URETEROSCOPY WITH DISTRACTION OF TUMOR, FULGURATION;  Surgeon: Jerilee Field, MD;  Location: [facility];  Service: Urology;  Laterality: N/A;   TRANSURETHRAL RESECTION OF BLADDER TUMOR N/A 03/15/2022   Procedure: TRANSURETHRAL  RESECTION OF BLADDER TUMOR (TURBT) WITH BILATERAL URETEROSCOPY/URETHRAL/ DILATION/BILATERAL RETROGRADE PYELOGRAM/BIOPSY AND FULGURATION OF RIGHT URETERAL CANCER/RIGHT STENT PLACEMENT;  Surgeon: Jerilee Field, MD;  Location: [facility];  Service: Urology;  Laterality: N/A;   TRANSURETHRAL RESECTION OF BLADDER TUMOR WITH MITOMYCIN-C N/A 12/02/2022   Procedure: TRANSURETHRAL RESECTION OF BLADDER TUMOR;  Surgeon: Jerilee Field, MD;  Location: [facility];  Service: Urology;  Laterality: N/A;    I have reviewed the social history and family history with the patient and they are unchanged from previous note.  ALLERGIES:  has No Known Allergies.  MEDICATIONS:  Current Outpatient Medications  Medication Sig Dispense Refill   acetaminophen (TYLENOL) 500 MG tablet Take 1,000 mg by mouth every 6 (six) hours as needed.     apixaban (ELIQUIS) 5 MG TABS tablet Take 1 tablet (5 mg total) by mouth 2 (two) times daily. 60 tablet 5   atorvastatin (LIPITOR) 40 MG tablet TAKE ONE TABLET BY MOUTH ONCE DAILY 90 tablet 1   fluticasone (FLONASE) 50 MCG/ACT nasal spray Place 1 spray into both nostrils daily as needed for allergies or rhinitis.     glipiZIDE (GLUCOTROL) 5 MG tablet TAKE 1/2 TABLET BY MOUTH EVERY EVENING 45 tablet 3   glucose blood test strip 1  each by Other route daily. Use daily for glucose control - Dx 250.00 100 each 0   hydrochlorothiazide (MICROZIDE) 12.5 MG capsule Take 1 capsule (12.5 mg total) by mouth daily. 30 capsule 11   lisinopril (ZESTRIL) 40 MG tablet Take 1 tablet (40 mg total) by mouth daily. 90 tablet 3   metFORMIN (GLUCOPHAGE-XR) 500 MG 24 hr tablet Take 4 tablets (2,000 mg total) by mouth daily with supper. 360 tablet 3   metoprolol succinate (TOPROL-XL) 25 MG 24 hr tablet TAKE ONE TABLET BY MOUTH ONCE DAILY 90 tablet 3   Multiple Vitamins-Minerals (EMERGEN-C VITAMIN C) PACK Take 1 packet by mouth daily as needed (immune support).     Multiple Vitamins-Minerals  (ZINC PO) Take 1 tablet by mouth daily as needed (immune support).     pembrolizumab (KEYTRUDA) 100 MG/4ML SOLN Inject 2 mg/kg into the vein.     propranolol (INDERAL) 10 MG tablet TAKE 1 TABLET BY MOUTH 4 TIMES DAILY AS NEEDED FOR PALPITATIONS, IRREGULAR OR FAST HEART RATE 270 tablet 1   Semaglutide, 1 MG/DOSE, 4 MG/3ML SOPN Inject 1 mg as directed once a week. 3 mL 5   sildenafil (REVATIO) 20 MG tablet TAKE 1 TO 5 TABLETS 2 TO 3 HOURS BEFORE SEX 60 tablet 5   tamsulosin (FLOMAX) 0.4 MG CAPS capsule Take 0.4 mg by mouth at bedtime.     traMADol (ULTRAM) 50 MG tablet Take 1 tablet (50 mg total) by mouth every 6 (six) hours as needed for severe pain. 20 tablet 0   No current facility-administered medications for this visit.    PHYSICAL EXAMINATION: ECOG PERFORMANCE STATUS: 2 - Symptomatic, <50% confined to bed  Vitals:   04/06/23 1413  BP: 136/87  Pulse: 94  Resp: 18  Temp: 98.2 F (36.8 C)  SpO2: 96%   Wt Readings from Last 3 Encounters:  04/06/23 207 lb 11.2 oz (94.2 kg)  03/31/23 212 lb (96.2 kg)  03/16/23 212 lb 12.8 oz (96.5 kg)     GENERAL:alert, no distress and comfortable SKIN: skin color normal, no rashes or significant lesions EYES: normal, Conjunctiva are pink and non-injected, sclera clear  NEURO: alert & oriented x 3 with fluent speech  LABORATORY DATA:  I have reviewed the data as listed    Latest Ref Rng & Units 04/06/2023    1:30 PM 03/16/2023   12:51 PM 02/27/2023    3:47 PM  CBC  WBC 4.0 - 10.5 K/uL 9.6  10.8  10.0   Hemoglobin 13.0 - 17.0 g/dL 29.5  28.4  13.2   Hematocrit 39.0 - 52.0 % 46.6  45.3  47.6   Platelets 150 - 400 K/uL 195  190  212.0         Latest Ref Rng & Units 03/16/2023   12:51 PM 02/27/2023    3:47 PM 02/23/2023   11:35 AM  CMP  Glucose 70 - 99 mg/dL 440  102  725   BUN 8 - 23 mg/dL 17  14  15    Creatinine 0.61 - 1.24 mg/dL 3.66  4.40  3.47   Sodium 135 - 145 mmol/L 137  137  136   Potassium 3.5 - 5.1 mmol/L 4.4  4.2  4.5    Chloride 98 - 111 mmol/L 103  99  102   CO2 22 - 32 mmol/L 26  30  29    Calcium 8.9 - 10.3 mg/dL 9.7  9.3  9.3   Total Protein 6.5 - 8.1 g/dL 6.9  6.8  6.9   Total Bilirubin 0.3 - 1.2 mg/dL 0.5  0.7  0.7   Alkaline Phos 38 - 126 U/L 72  84  66   AST 15 - 41 U/L 15  14  13    ALT 0 - 44 U/L 14  14  11        RADIOGRAPHIC STUDIES: I have personally reviewed the radiological images as listed and agreed with the findings in the report. No results found.    No orders of the defined types were placed in this encounter.  All questions were answered. The patient knows to call the clinic with any problems, questions or concerns. No barriers to learning was detected. The total time spent in the appointment was 25 minutes.     Malachy Mood, MD 04/06/2023   Carolin Coy, CMA, am acting as scribe for Malachy Mood, MD.   I have reviewed the above documentation for accuracy and completeness, and I agree with the above.

## 2023-04-06 NOTE — Assessment & Plan Note (Signed)
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 -He is clinically doing well, asymptomatic.  Surgical resection was discussed with patient, but he would like to try nonsurgical options. -I discussed intravesical therapy, such as gemcitabine or mitomycin, unfortunately it would not reach to the ureter and renal pelvis to treat all the disease that he has. -I recommend systemic therapy with immunotherapy pembrolizumab 200 mg every 3 weeks, or 400 mg every 6 weeks if he tolerates well.  This has been studied in the keynote 057 trial, it demonstrated 39% complete response in 3 months in BCG unresponsive high risk nonmuscle invasive bladder cancer. -He started Keytruda on 01/12/23, tolerated first cycle well except a mild fatigue.  No other noticeable side effects.  We again reviewed potential AE's, and what to watch.  He voices good understanding. -He will follow-up with urologist Dr. Lennox Laity for repeated cystoscopy and evaluation of his response to immunotherapy -His recent cystoscopy was suspicious for cancer recurrence, he is scheduled to have biopsy -If biopsy confirms progression, will stop Keytruda.  He will discuss surgical options with Dr. Lennox Laity.

## 2023-04-08 LAB — PROSTATE-SPECIFIC AG, SERUM (LABCORP): Prostate Specific Ag, Serum: 5.8 ng/mL — ABNORMAL HIGH (ref 0.0–4.0)

## 2023-04-09 ENCOUNTER — Encounter: Payer: Self-pay | Admitting: Hematology

## 2023-04-11 ENCOUNTER — Ambulatory Visit: Payer: Self-pay | Admitting: Student

## 2023-04-11 DIAGNOSIS — E119 Type 2 diabetes mellitus without complications: Secondary | ICD-10-CM

## 2023-04-13 ENCOUNTER — Other Ambulatory Visit: Payer: Self-pay | Admitting: Internal Medicine

## 2023-04-13 DIAGNOSIS — E119 Type 2 diabetes mellitus without complications: Secondary | ICD-10-CM

## 2023-04-24 ENCOUNTER — Encounter (HOSPITAL_BASED_OUTPATIENT_CLINIC_OR_DEPARTMENT_OTHER): Payer: Self-pay | Admitting: Urology

## 2023-04-24 ENCOUNTER — Ambulatory Visit: Payer: Medicare HMO | Attending: Internal Medicine

## 2023-04-24 ENCOUNTER — Other Ambulatory Visit: Payer: Self-pay

## 2023-04-24 DIAGNOSIS — Z01810 Encounter for preprocedural cardiovascular examination: Secondary | ICD-10-CM | POA: Diagnosis not present

## 2023-04-24 NOTE — Progress Notes (Signed)
Spoke w/ via phone for pre-op interview---PT Lab needs dos----  I STAT             Lab results------SEE BELOW COVID test -----patient states asymptomatic no test needed Arrive at -------530 am 05-05-2023 NPO after MN NO Solid Food.  Clear liquids from MN until---430 am Med rec completed Medications to take morning of surgery -----none Diabetic medication -----take last dose of ozempic on 04-25-2023 Patient instructed no nail polish to be worn day of surgery Patient instructed to bring photo id and insurance card day of surgery Patient aware to have Driver (ride )  [employer] caregiver  wife dorinda   for 24 hours after surgery  Patient Special Instructions -----none Pre-Op special Instructions -----none Patient verbalized understanding of instructions that were given at this phone interview. Patient denies shortness of breath, chest pain, fever, cough at this phone interview.  Anesthesia Review:atrial fib, hld, dm type 2   PCP: dr Kennedy Bucker Cardiologist : dr Fonnie Mu 08-30-2022 f/u in 1 year Chest x-ray : 03-03-2023 EKG :08-30-2022 epic Echo :11-16-2017 epic Stress test:none Cardiac Cath : none Activity level: does housework, using cane as needed (has upcoming hip replacement surgery 05-2023),  Sleep Study/ CPAP :none Fasting Blood Sugar :      / Checks Blood Sugar -- times a day:   Blood Thinner/ Instructions /Last Dose: stop eliquis 2 days before surgery per Alden Server dick np 04-24-2023 note on blood thinner/cardiac clearance note on chart.pt states he was told by cardiology to stop eliquis 2 days before surgery.

## 2023-04-24 NOTE — Progress Notes (Signed)
Virtual Visit via Telephone Note   Because of Robert [name]'s co-morbid illnesses, he is at least at moderate risk for complications without adequate follow up.  This format is felt to be most appropriate for this patient at this time.  The patient did not have access to video technology/had technical difficulties with video requiring transitioning to audio format only (telephone).  All issues noted in this document were discussed and addressed.  No physical exam could be performed with this format.  Please refer to the patient's chart for his consent to telehealth for [facility].  Evaluation Performed:  Preoperative cardiovascular risk assessment _____________   Date:  04/24/2023   Patient ID:  Robert [name], Robert [name] 1951/02/22, MRN [id number] Patient Location:  Home Provider location:   Office  Primary Care Provider:  Tresa Garter, MD Primary Cardiologist:  [name], MD  Chief Complaint / Patient Profile   72 y.o. y/o male with a h/o atrial fibrillation, HLD, DM type [name] who is pending bladder biopsy/TURP and presents today for telephonic preoperative cardiovascular risk assessment.  History of Present Illness    Robert [name] is a 72 y.o. male who presents via audio/video conferencing for a telehealth visit today.  Pt was last seen in cardiology clinic on 08/30/2022 by Dr. Elease Hashimoto.  At that time Robert [name] was doing well with no cardiac complaints and rare episodes of AF.  The patient is now pending procedure as outlined above. Since his last visit, he has been doing well with no sustained episodes of atrial fibrillation.  He does use as needed propranolol and endorse using it at least once per month with good rate control.   Past Medical History    Past Medical History:  Diagnosis Date   Anticoagulant long-term use    eliquis--- managed by cardiology   Bladder cancer [facility])    urologist--- dr Mena Goes;  overlapping   History  of basal cell carcinoma (BCC) excision    History of COVID-19 05/2019   per pt mild symptoms that resolved   HLD (hyperlipidemia)    HTN (hypertension)    Hx of colonic polyps    Insulin dependent type 2 diabetes mellitus [facility])    endocrinologist--- dr Elvera Lennox --  (03-11-2022 per pt check blood sugar 1-2 times daily,  fasting sugar-- 118-120s)   Nocturia    Paroxysmal atrial fibrillation (HCC) 03/12/2019   cardiologist--- dr Melburn Popper   Ureterocele    Past Surgical History:  Procedure Laterality Date   BLEPHAROPLASTY Bilateral    per pt approx  2001;   upper eyelid's   COLONOSCOPY     last one approx 2020   CYSTOSCOPY WITH URETEROSCOPY AND STENT PLACEMENT Right 12/02/2022   Procedure: CYSTOSCOPY WITH RIGHT URETEROSCOPY, RIGHT DIAGNOSTIC URETEROSCOPY;  Surgeon: Jerilee Field, MD;  Location: [facility];  Service: Urology;  Laterality: Right;  90 MINS FOR CASE   PATELLAR TENDON REPAIR Left 04/16/2004   @[facility]    TRANSURETHRAL RESECTION OF BLADDER TUMOR N/A 06/18/2021   Procedure: TRANSURETHRAL RESECTION OF BLADDER TUMOR (TURBT) WITH RIGHT URETERAL STENT PLACEMENT, RIGHT URETEROSCOPY WITH DISTRACTION OF TUMOR, FULGURATION;  Surgeon: Jerilee Field, MD;  Location: [facility];  Service: Urology;  Laterality: N/A;   TRANSURETHRAL RESECTION OF BLADDER TUMOR N/A 03/15/2022   Procedure: TRANSURETHRAL RESECTION OF BLADDER TUMOR (TURBT) WITH BILATERAL URETEROSCOPY/URETHRAL/ DILATION/BILATERAL RETROGRADE PYELOGRAM/BIOPSY AND FULGURATION OF RIGHT URETERAL CANCER/RIGHT STENT PLACEMENT;  Surgeon: Jerilee Field, MD;  Location: [facility];  Service: Urology;  Laterality: N/A;   TRANSURETHRAL RESECTION OF BLADDER TUMOR WITH MITOMYCIN-C N/A 12/02/2022   Procedure: TRANSURETHRAL RESECTION OF BLADDER TUMOR;  Surgeon: Jerilee Field, MD;  Location: [facility];  Service: Urology;  Laterality: N/A;    Allergies  No Known Allergies  Home Medications    Prior to Admission  medications   Medication Sig Start Date End Date Taking? Authorizing Provider  acetaminophen (TYLENOL) 500 MG tablet Take 1,000 mg by mouth every 6 (six) hours as needed.    [provider]  apixaban (ELIQUIS) 5 MG TABS tablet Take 1 tablet (5 mg total) by mouth 2 (two) times daily. 12/05/22   Jerilee Field, MD  atorvastatin (LIPITOR) 40 MG tablet TAKE ONE TABLET BY MOUTH ONCE DAILY 03/06/23   Carlus Pavlov, MD  fluticasone [facility]) 50 MCG/ACT nasal spray Place 1 spray into both nostrils daily as needed for allergies or rhinitis.    [provider]  glipiZIDE (GLUCOTROL) 5 MG tablet TAKE 1/2 TABLET BY MOUTH EVERY EVENING 02/09/23   Carlus Pavlov, MD  glucose blood test strip 1 each by Other route daily. Use daily for glucose control - Dx 250.00 04/21/20   [name], [name], MD  hydrochlorothiazide (MICROZIDE) 12.5 MG capsule Take 1 capsule (12.5 mg total) by mouth daily. 10/25/22   Plotnikov, [name], MD  lisinopril (ZESTRIL) 40 MG tablet Take 1 tablet (40 mg total) by mouth daily. 08/30/22   Nahser, Deloris Ping, MD  metFORMIN (GLUCOPHAGE-XR) 500 MG 24 hr tablet Take 4 tablets (2,000 mg total) by mouth daily with supper. 02/03/23   Carlus Pavlov, MD  metoprolol succinate (TOPROL-XL) 25 MG 24 hr tablet TAKE ONE TABLET BY MOUTH ONCE DAILY 09/06/22   Nahser, Deloris Ping, MD  Multiple Vitamins-Minerals (EMERGEN-C VITAMIN C) PACK Take 1 packet by mouth daily as needed (immune support).    [provider]  Multiple Vitamins-Minerals (ZINC PO) Take 1 tablet by mouth daily as needed (immune support).    [provider]  pembrolizumab (KEYTRUDA) 100 MG/4ML SOLN Inject 2 mg/kg into the vein.    [provider]  propranolol (INDERAL) 10 MG tablet TAKE 1 TABLET BY MOUTH 4 TIMES DAILY AS NEEDED FOR PALPITATIONS, IRREGULAR OR FAST HEART RATE 10/25/22   Nahser, Deloris Ping, MD  Semaglutide, 1 MG/DOSE, (OZEMPIC, 1 MG/DOSE,) 4 MG/3ML SOPN INJECT 1 MG ONCE A WEEK  AS DIRECTED 04/13/23   Carlus Pavlov, MD  sildenafil (REVATIO) 20 MG tablet TAKE 1 TO 5 TABLETS 2 TO 3 HOURS BEFORE SEX 03/10/23   Plotnikov, [name], MD  tamsulosin (FLOMAX) 0.4 MG CAPS capsule Take 0.4 mg by mouth at bedtime. 01/24/23   [provider]  traMADol (ULTRAM) 50 MG tablet Take 1 tablet (50 mg total) by mouth every 6 (six) hours as needed for severe pain. 02/10/23   Plotnikov, [name], MD    Physical Exam    Vital Signs:  Robert [name] does not have vital signs available for review today.  Given telephonic nature of communication, physical exam is limited. AAOx3. NAD. Normal affect.  Speech and respirations are unlabored.  Accessory Clinical Findings    None  Assessment & Plan    1.  Preoperative Cardiovascular Risk Assessment:  Patient is RCRI score 0.9%  The patient affirms he has been doing well without any new cardiac symptoms. They are able to achieve 6 METS without cardiac limitations. Therefore, based on ACC/AHA guidelines, the patient would be at acceptable risk  for the planned procedure without further cardiovascular testing. The patient was advised that if he develops new symptoms prior to surgery to contact our office to arrange for a follow-up visit, and he verbalized understanding.   The patient was advised that if he develops new symptoms prior to surgery to contact our office to arrange for a follow-up visit, and he verbalized understanding.  Patient can hold Eliquis 2 days prior to procedure and should restart postprocedure when surgically safe.  A copy of this note will be routed to requesting surgeon.  Time:   Today, I have spent 6 minutes with the patient with telehealth technology discussing medical history, symptoms, and management plan.     Napoleon Form, Leodis Rains, NP  04/24/2023, 7:00 AM

## 2023-04-25 ENCOUNTER — Other Ambulatory Visit (HOSPITAL_COMMUNITY): Payer: Medicare HMO

## 2023-05-04 NOTE — Anesthesia Preprocedure Evaluation (Addendum)
Anesthesia Evaluation  Patient identified by MRN, date of birth, ID band Patient awake    Reviewed: Allergy & Precautions, NPO status , Patient's Chart, lab work & pertinent test results, reviewed documented beta blocker date and time   History of Anesthesia Complications Negative for: history of anesthetic complications  Airway Mallampati: III  TM Distance: >3 FB Neck ROM: Full    Dental  (+) Dental Advisory Given   Pulmonary neg shortness of breath, neg sleep apnea, neg COPD, neg recent URI, Current Smoker and Patient abstained from smoking.   Pulmonary exam normal breath sounds clear to auscultation       Cardiovascular hypertension, Pt. on medications and Pt. on home beta blockers (-) angina (-) Past MI, (-) Cardiac Stents and (-) CABG + dysrhythmias Atrial Fibrillation  Rhythm:Regular Rate:Normal  HLD  TTE 11/16/2017: Study Conclusions   - Left ventricle: The cavity size was normal. There was severe    concentric hypertrophy. Systolic function was normal. The    estimated ejection fraction was in the range of 60% to 65%. Wall    motion was normal; there were no regional wall motion    abnormalities. There was an increased relative contribution of    atrial contraction to ventricular filling. Doppler parameters are    consistent with abnormal left ventricular relaxation (grade 1    diastolic dysfunction).  - Aortic valve: Trileaflet; mildly thickened, mildly calcified    leaflets.  - Aorta: Aortic root dimension: 40 mm (ED). Ascending aorta    diameter: 38 mm (ED).  - Aortic root: The aortic root was mildly dilated.  - Mitral valve: Calcified annulus.  - Pulmonic valve: There was trivial regurgitation.  - Pulmonary arteries: Systolic pressure could not be accurately    estimated.     Neuro/Psych negative neurological ROS     GI/Hepatic negative GI ROS, Neg liver ROS,,,  Endo/Other  diabetes, Well Controlled, Type  2, Insulin Dependent    Renal/GU negative Renal ROS   Bladder cancer    Musculoskeletal  (+) Arthritis , Osteoarthritis,    Abdominal   Peds  Hematology negative hematology ROS (+)   Anesthesia Other Findings   Reproductive/Obstetrics                              Anesthesia Physical Anesthesia Plan  ASA: 3  Anesthesia Plan: General   Post-op Pain Management:    Induction: Intravenous  PONV Risk Score and Plan: 1 and Ondansetron, Dexamethasone and Treatment may vary due to age or medical condition  Airway Management Planned: LMA  Additional Equipment:   Intra-op Plan:   Post-operative Plan: Extubation in OR  Informed Consent: I have reviewed the patients History and Physical, chart, labs and discussed the procedure including the risks, benefits and alternatives for the proposed anesthesia with the patient or authorized representative who has indicated his/her understanding and acceptance.     Dental advisory given  Plan Discussed with: CRNA and Anesthesiologist  Anesthesia Plan Comments: (Risks of general anesthesia discussed including, but not limited to, sore throat, hoarse voice, chipped/damaged teeth, injury to vocal cords, nausea and vomiting, allergic reactions, lung infection, heart attack, stroke, and death. All questions answered.   See PAT note 11/25/22)        Anesthesia Quick Evaluation

## 2023-05-05 ENCOUNTER — Ambulatory Visit (HOSPITAL_BASED_OUTPATIENT_CLINIC_OR_DEPARTMENT_OTHER): Payer: Medicare HMO | Admitting: Anesthesiology

## 2023-05-05 ENCOUNTER — Encounter (HOSPITAL_BASED_OUTPATIENT_CLINIC_OR_DEPARTMENT_OTHER)
Admission: RE | Disposition: A | Discharge status: Home / Self Care | Payer: Self-pay | Source: Home / Self Care | Attending: Urology

## 2023-05-05 ENCOUNTER — Encounter (HOSPITAL_BASED_OUTPATIENT_CLINIC_OR_DEPARTMENT_OTHER): Payer: Self-pay | Admitting: Urology

## 2023-05-05 ENCOUNTER — Ambulatory Visit (HOSPITAL_BASED_OUTPATIENT_CLINIC_OR_DEPARTMENT_OTHER)
Admission: RE | Admit: 2023-05-05 | Discharge: 2023-05-05 | Disposition: A | Discharge status: Home / Self Care | Payer: Medicare HMO | Attending: Urology | Admitting: Urology

## 2023-05-05 DIAGNOSIS — Z794 Long term (current) use of insulin: Secondary | ICD-10-CM | POA: Diagnosis not present

## 2023-05-05 DIAGNOSIS — I10 Essential (primary) hypertension: Secondary | ICD-10-CM | POA: Insufficient documentation

## 2023-05-05 DIAGNOSIS — C678 Malignant neoplasm of overlapping sites of bladder: Secondary | ICD-10-CM

## 2023-05-05 DIAGNOSIS — E119 Type 2 diabetes mellitus without complications: Secondary | ICD-10-CM | POA: Diagnosis not present

## 2023-05-05 DIAGNOSIS — I480 Paroxysmal atrial fibrillation: Secondary | ICD-10-CM | POA: Diagnosis not present

## 2023-05-05 DIAGNOSIS — C679 Malignant neoplasm of bladder, unspecified: Secondary | ICD-10-CM | POA: Insufficient documentation

## 2023-05-05 DIAGNOSIS — F17290 Nicotine dependence, other tobacco product, uncomplicated: Secondary | ICD-10-CM | POA: Diagnosis not present

## 2023-05-05 DIAGNOSIS — E785 Hyperlipidemia, unspecified: Secondary | ICD-10-CM | POA: Diagnosis not present

## 2023-05-05 DIAGNOSIS — C661 Malignant neoplasm of right ureter: Secondary | ICD-10-CM

## 2023-05-05 DIAGNOSIS — Z01818 Encounter for other preprocedural examination: Secondary | ICD-10-CM

## 2023-05-05 DIAGNOSIS — C675 Malignant neoplasm of bladder neck: Secondary | ICD-10-CM | POA: Diagnosis not present

## 2023-05-05 DIAGNOSIS — F17210 Nicotine dependence, cigarettes, uncomplicated: Secondary | ICD-10-CM

## 2023-05-05 HISTORY — PX: TRANSURETHRAL RESECTION OF PROSTATE: SHX73

## 2023-05-05 HISTORY — DX: Unspecified osteoarthritis, unspecified site: M19.90

## 2023-05-05 HISTORY — DX: Dependence on other enabling machines and devices: Z99.89

## 2023-05-05 HISTORY — PX: TRANSURETHRAL RESECTION OF BLADDER TUMOR: SHX2575

## 2023-05-05 HISTORY — PX: CYSTOSCOPY/RETROGRADE/URETEROSCOPY: SHX5316

## 2023-05-05 LAB — POCT I-STAT, CHEM 8
BUN: 22 mg/dL (ref 8–23)
Calcium, Ion: 1.2 mmol/L (ref 1.15–1.40)
Chloride: 107 mmol/L (ref 98–111)
Creatinine, Ser: 0.8 mg/dL (ref 0.61–1.24)
Glucose, Bld: 151 mg/dL — ABNORMAL HIGH (ref 70–99)
HCT: 45 % (ref 39.0–52.0)
Hemoglobin: 15.3 g/dL (ref 13.0–17.0)
Potassium: 4.3 mmol/L (ref 3.5–5.1)
Sodium: 141 mmol/L (ref 135–145)
TCO2: 26 mmol/L (ref 22–32)

## 2023-05-05 LAB — GLUCOSE, CAPILLARY: Glucose-Capillary: 169 mg/dL — ABNORMAL HIGH (ref 70–99)

## 2023-05-05 SURGERY — TURBT (TRANSURETHRAL RESECTION OF BLADDER TUMOR)
Anesthesia: General | Site: Renal

## 2023-05-05 MED ORDER — ACETAMINOPHEN 500 MG PO TABS
ORAL_TABLET | ORAL | Status: AC
  Filled 2023-05-05: qty 1

## 2023-05-05 MED ORDER — ROCURONIUM BROMIDE 10 MG/ML (PF) SYRINGE
PREFILLED_SYRINGE | INTRAVENOUS | Status: AC
  Filled 2023-05-05: qty 10

## 2023-05-05 MED ORDER — SODIUM CHLORIDE 0.9 % IV SOLN
2.0000 g | INTRAVENOUS | Status: AC
  Administered 2023-05-05: 2 g via INTRAVENOUS

## 2023-05-05 MED ORDER — ACETAMINOPHEN 325 MG PO TABS: 325.0000 mg | ORAL_TABLET | ORAL | Status: DC | PRN

## 2023-05-05 MED ORDER — ACETAMINOPHEN 160 MG/5ML PO SOLN: 325.0000 mg | ORAL | Status: DC | PRN

## 2023-05-05 MED ORDER — ONDANSETRON HCL 4 MG/2ML IJ SOLN: 4.0000 mg | Freq: Once | INTRAMUSCULAR | Status: DC | PRN

## 2023-05-05 MED ORDER — FENTANYL CITRATE (PF) 100 MCG/2ML IJ SOLN
INTRAMUSCULAR | Status: AC
  Filled 2023-05-05: qty 2

## 2023-05-05 MED ORDER — SODIUM CHLORIDE 0.9 % IV SOLN
INTRAVENOUS | Status: AC
  Filled 2023-05-05: qty 100

## 2023-05-05 MED ORDER — DEXAMETHASONE SODIUM PHOSPHATE 10 MG/ML IJ SOLN
INTRAMUSCULAR | Status: AC
  Filled 2023-05-05: qty 1

## 2023-05-05 MED ORDER — PHENYLEPHRINE HCL (PRESSORS) 10 MG/ML IV SOLN
INTRAVENOUS | Status: DC | PRN
  Administered 2023-05-05: 80 ug via INTRAVENOUS

## 2023-05-05 MED ORDER — OXYCODONE HCL 5 MG PO TABS: 5.0000 mg | ORAL_TABLET | Freq: Once | ORAL | Status: DC | PRN

## 2023-05-05 MED ORDER — MEPERIDINE HCL 25 MG/ML IJ SOLN: 6.2500 mg | INTRAMUSCULAR | Status: DC | PRN

## 2023-05-05 MED ORDER — ONDANSETRON HCL 4 MG/2ML IJ SOLN
INTRAMUSCULAR | Status: AC
  Filled 2023-05-05: qty 2

## 2023-05-05 MED ORDER — CELECOXIB 200 MG PO CAPS
ORAL_CAPSULE | ORAL | Status: AC
  Filled 2023-05-05: qty 1

## 2023-05-05 MED ORDER — APIXABAN 5 MG PO TABS
5.0000 mg | ORAL_TABLET | Freq: Two times a day (BID) | ORAL | Status: DC
Start: 2023-05-08 — End: 2023-06-05

## 2023-05-05 MED ORDER — ACETAMINOPHEN 500 MG PO TABS
1000.0000 mg | ORAL_TABLET | Freq: Once | ORAL | Status: AC
  Administered 2023-05-05: 1000 mg via ORAL

## 2023-05-05 MED ORDER — LIDOCAINE 2% (20 MG/ML) 5 ML SYRINGE
INTRAMUSCULAR | Status: DC | PRN
  Administered 2023-05-05: 100 mg via INTRAVENOUS
  Administered 2023-05-05: 60 mg via INTRAVENOUS

## 2023-05-05 MED ORDER — SODIUM CHLORIDE 0.9 % IV SOLN: INTRAVENOUS | Status: DC

## 2023-05-05 MED ORDER — OXYCODONE HCL 5 MG/5ML PO SOLN: 5.0000 mg | Freq: Once | ORAL | Status: DC | PRN

## 2023-05-05 MED ORDER — SODIUM CHLORIDE 0.9 % IR SOLN
Status: DC | PRN
  Administered 2023-05-05 (×2): 6000 mL

## 2023-05-05 MED ORDER — PHENYLEPHRINE 80 MCG/ML (10ML) SYRINGE FOR IV PUSH (FOR BLOOD PRESSURE SUPPORT)
PREFILLED_SYRINGE | INTRAVENOUS | Status: AC
  Filled 2023-05-05: qty 10

## 2023-05-05 MED ORDER — FENTANYL CITRATE (PF) 100 MCG/2ML IJ SOLN: 25.0000 ug | INTRAMUSCULAR | Status: DC | PRN

## 2023-05-05 MED ORDER — DEXAMETHASONE SODIUM PHOSPHATE 10 MG/ML IJ SOLN
INTRAMUSCULAR | Status: DC | PRN
  Administered 2023-05-05: 5 mg via INTRAVENOUS

## 2023-05-05 MED ORDER — LIDOCAINE HCL (PF) 2 % IJ SOLN
INTRAMUSCULAR | Status: AC
  Filled 2023-05-05: qty 5

## 2023-05-05 MED ORDER — CELECOXIB 200 MG PO CAPS
200.0000 mg | ORAL_CAPSULE | Freq: Once | ORAL | Status: AC
  Administered 2023-05-05: 200 mg via ORAL

## 2023-05-05 MED ORDER — 0.9 % SODIUM CHLORIDE (POUR BTL) OPTIME
TOPICAL | Status: DC | PRN
  Administered 2023-05-05: 500 mL

## 2023-05-05 MED ORDER — CEFTRIAXONE SODIUM 2 G IJ SOLR
INTRAMUSCULAR | Status: AC
  Filled 2023-05-05: qty 20

## 2023-05-05 MED ORDER — ONDANSETRON HCL 4 MG/2ML IJ SOLN
INTRAMUSCULAR | Status: DC | PRN
  Administered 2023-05-05: 4 mg via INTRAVENOUS

## 2023-05-05 MED ORDER — SUGAMMADEX SODIUM 200 MG/2ML IV SOLN
INTRAVENOUS | Status: DC | PRN
  Administered 2023-05-05: 200 mg via INTRAVENOUS

## 2023-05-05 MED ORDER — PROPOFOL 10 MG/ML IV BOLUS
INTRAVENOUS | Status: DC | PRN
Start: 2023-05-05 — End: 2023-05-05
  Administered 2023-05-05: 200 mg via INTRAVENOUS

## 2023-05-05 MED ORDER — PROPOFOL 10 MG/ML IV BOLUS
INTRAVENOUS | Status: AC
  Filled 2023-05-05: qty 20

## 2023-05-05 MED ORDER — ROCURONIUM BROMIDE 10 MG/ML (PF) SYRINGE
PREFILLED_SYRINGE | INTRAVENOUS | Status: DC | PRN
  Administered 2023-05-05: 50 mg via INTRAVENOUS
  Administered 2023-05-05: 20 mg via INTRAVENOUS

## 2023-05-05 MED ORDER — IOHEXOL 300 MG/ML  SOLN
INTRAMUSCULAR | Status: DC | PRN
  Administered 2023-05-05: 30 mL via URETHRAL

## 2023-05-05 MED ORDER — FENTANYL CITRATE (PF) 100 MCG/2ML IJ SOLN
INTRAMUSCULAR | Status: DC | PRN
  Administered 2023-05-05 (×3): 50 ug via INTRAVENOUS
  Administered 2023-05-05 (×2): 25 ug via INTRAVENOUS

## 2023-05-05 SURGICAL SUPPLY — 34 items
BAG DRAIN URO-CYSTO SKYTR STRL (DRAIN) ×3 IMPLANT
BAG DRN RND TRDRP ANRFLXCHMBR (UROLOGICAL SUPPLIES) ×3
BAG DRN UROCATH (DRAIN) ×3
BAG URINE DRAIN 2000ML AR STRL (UROLOGICAL SUPPLIES) IMPLANT
BAG URINE LEG 500ML (DRAIN) IMPLANT
CATH FOLEY 2WAY SLVR 5CC 18FR (CATHETERS) IMPLANT
CATH FOLEY 2WAY SLVR 5CC 20FR (CATHETERS) IMPLANT
CATH FOLEY 2WAY SLVR 5CC 22FR (CATHETERS) IMPLANT
CATH URET 5FR 70CM CONE TIP (BALLOONS) IMPLANT
CATH URETERAL DUAL LUMEN 10F (MISCELLANEOUS) IMPLANT
CATH URETL OPEN 5X70 (CATHETERS) IMPLANT
CLOTH BEACON ORANGE TIMEOUT ST (SAFETY) ×3 IMPLANT
DRSG TELFA 3X8 NADH STRL (GAUZE/BANDAGES/DRESSINGS) IMPLANT
ELECT REM PT RETURN 9FT ADLT (ELECTROSURGICAL)
ELECTRODE REM PT RTRN 9FT ADLT (ELECTROSURGICAL) ×3 IMPLANT
EVACUATOR MICROVAS BLADDER (UROLOGICAL SUPPLIES) IMPLANT
GLOVE BIO SURGEON STRL SZ7.5 (GLOVE) ×3 IMPLANT
GLOVE BIO SURGEON STRL SZ8 (GLOVE) IMPLANT
GOWN STRL REUS W/TWL LRG LVL3 (GOWN DISPOSABLE) ×3 IMPLANT
GUIDEWIRE ANG ZIPWIRE 038X150 (WIRE) IMPLANT
GUIDEWIRE STR DUAL SENSOR (WIRE) ×3 IMPLANT
GUIDEWIRE ZIPWRE .038 STRAIGHT (WIRE) IMPLANT
HOLDER FOLEY CATH W/STRAP (MISCELLANEOUS) IMPLANT
IV NS IRRIG 3000ML ARTHROMATIC (IV SOLUTION) ×6 IMPLANT
KIT TURNOVER CYSTO (KITS) ×3 IMPLANT
LOOP CUT BIPOLAR 24F LRG (ELECTROSURGICAL) IMPLANT
MANIFOLD NEPTUNE II (INSTRUMENTS) ×3 IMPLANT
NS IRRIG 500ML POUR BTL (IV SOLUTION) ×3 IMPLANT
PACK CYSTO (CUSTOM PROCEDURE TRAY) ×3 IMPLANT
PLUG CATH AND CAP STRL 200 (CATHETERS) IMPLANT
SLEEVE SCD COMPRESS KNEE MED (STOCKING) ×3 IMPLANT
STENT URET 6FRX26 CONTOUR (STENTS) IMPLANT
TUBE CONNECTING 12X1/4 (SUCTIONS) ×3 IMPLANT
TUBING UROLOGY SET (TUBING) ×3 IMPLANT

## 2023-05-05 NOTE — Anesthesia Postprocedure Evaluation (Signed)
Anesthesia Post Note  Patient: Robert [name]  Procedure(s) Performed: TRANSURETHRAL RESECTION OF BLADDER TUMOR (TURBT) BLADDER BIOSPY (Bladder) CYSTOSCOPY BILATERAL RETROGRADE PYELOGRAM RIGHT URETEROSCOPY RIGHT STENT PLACEMENT (Bilateral: Renal) TRANSURETHRAL RESECTION OF THE PROSTATE (TURP) (Prostate)     Patient location during evaluation: PACU Anesthesia Type: General Level of consciousness: awake and alert Pain management: pain level controlled Vital Signs Assessment: post-procedure vital signs reviewed and stable Respiratory status: spontaneous breathing, nonlabored ventilation, respiratory function stable and patient connected to nasal cannula oxygen Cardiovascular status: blood pressure returned to baseline and stable Postop Assessment: no apparent nausea or vomiting Anesthetic complications: no   No notable events documented.  Last Vitals:  Vitals:   05/05/23 0930 05/05/23 1009  BP: (!) 122/54 130/73  Pulse: 72 74  Resp:  16  Temp:  (!) 36.3 C  SpO2: 93% 99%    Last Pain:  Vitals:   05/05/23 1009  TempSrc: Axillary  PainSc:                  Robert [name]

## 2023-05-05 NOTE — Discharge Instructions (Addendum)
Removal of the Foley: Remove the Foley catheter on Monday morning, May 08, 2023 as instructed   No acetaminophen/Tylenol until after 12 pm today if needed.    No ibuprofen, Advil, Aleve, Motrin, ketorolac, meloxicam, naproxen, or other NSAIDS until after 12 pm today if needed.   CYSTOSCOPY HOME CARE INSTRUCTIONS  Activity: Rest for the remainder of the day.  Do not drive or operate equipment today.  You may resume normal activities in one to two days as instructed by your physician.   Meals: Drink plenty of liquids and eat light foods such as gelatin or soup this evening.  You may return to a normal meal plan tomorrow.  Return to Work: You may return to work in one to two days or as instructed by your physician.  Special Instructions / Symptoms: Call your physician if any of these symptoms occur:   -persistent or heavy bleeding  -bleeding which continues after first few urination  -large blood clots that are difficult to pass  -urine stream diminishes or stops completely  -fever equal to or higher than 101 degrees Farenheit.  -cloudy urine with a strong, foul odor  -severe pain     Post Anesthesia Home Care Instructions  Activity: Get plenty of rest for the remainder of the day. A responsible individual must stay with you for 24 hours following the procedure.  For the next 24 hours, DO NOT: -Drive a car -Advertising copywriter -Drink alcoholic beverages -Take any medication unless instructed by your physician -Make any legal decisions or sign important papers.  Meals: Start with liquid foods such as gelatin or soup. Progress to regular foods as tolerated. Avoid greasy, spicy, heavy foods. If nausea and/or vomiting occur, drink only clear liquids until the nausea and/or vomiting subsides. Call your physician if vomiting continues.  Special Instructions/Symptoms: Your throat may feel dry or sore from the anesthesia or the breathing tube placed in your throat during  surgery. If this causes discomfort, gargle with warm salt water. The discomfort should disappear within 24 hours.

## 2023-05-05 NOTE — H&P (Signed)
H&P  Chief Complaint: Bladder cancer, right ureteral cancer  History of Present Illness: Patient is a 72 year old male with a history of high-grade TA and T1 bladder cancer.  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  He was treated with a stent and BCG x 2 and then earlier in 2024 with cycles of Keytruda.  Office cystoscopy June 2024 revealed some possible recurrence or progression of the posterior bladder wall and bladder neck.  He is brought today for bilateral retrogrades, bladder biopsy or TURBT, right ureteroscopy possible left ureteroscopy.  He has had some urgency and urge incontinence.  Some mild dysuria.  This is chronic.  Nothing new.  He has had no bladder pain or fever.  No gross hematuria.  No flank pain.  Patient stopped his Eliquis.  Past Medical History:  Diagnosis Date   Anticoagulant long-term use    eliquis--- managed by cardiology   Arthritis    Bladder cancer [facility])    urologist--- dr Mena Goes;  overlapping   History of basal cell carcinoma (BCC) excision    History of COVID-19 05/2019   per pt mild symptoms that resolved   HLD (hyperlipidemia)    HTN (hypertension)    Hx of colonic polyps    Insulin dependent type 2 diabetes mellitus [facility])    endocrinologist--- dr Elvera Lennox --  (03-11-2022 per pt check blood sugar 1-2 times daily,  fasting sugar-- 118-120s)   Nocturia    Paroxysmal atrial fibrillation (HCC) 03/12/2019   cardiologist--- dr Melburn Popper   Ureterocele    Use of cane as ambulatory aid    Past Surgical History:  Procedure Laterality Date   BLEPHAROPLASTY Bilateral    per pt approx  2001;   upper eyelid's   COLONOSCOPY     last one approx 2023   CYSTOSCOPY WITH URETEROSCOPY AND STENT PLACEMENT Right 12/02/2022   Procedure: CYSTOSCOPY WITH RIGHT URETEROSCOPY, RIGHT DIAGNOSTIC URETEROSCOPY;  Surgeon: Jerilee Field, MD;  Location: [facility];  Service: Urology;  Laterality: Right;  90 MINS FOR CASE    PATELLAR TENDON REPAIR Left 04/16/2004   @[facility]    TRANSURETHRAL RESECTION OF BLADDER TUMOR N/A 06/18/2021   Procedure: TRANSURETHRAL RESECTION OF BLADDER TUMOR (TURBT) WITH RIGHT URETERAL STENT PLACEMENT, RIGHT URETEROSCOPY WITH DISTRACTION OF TUMOR, FULGURATION;  Surgeon: Jerilee Field, MD;  Location: [facility];  Service: Urology;  Laterality: N/A;   TRANSURETHRAL RESECTION OF BLADDER TUMOR N/A 03/15/2022   Procedure: TRANSURETHRAL RESECTION OF BLADDER TUMOR (TURBT) WITH BILATERAL URETEROSCOPY/URETHRAL/ DILATION/BILATERAL RETROGRADE PYELOGRAM/BIOPSY AND FULGURATION OF RIGHT URETERAL CANCER/RIGHT STENT PLACEMENT;  Surgeon: Jerilee Field, MD;  Location: [facility];  Service: Urology;  Laterality: N/A;   TRANSURETHRAL RESECTION OF BLADDER TUMOR WITH MITOMYCIN-C N/A 12/02/2022   Procedure: TRANSURETHRAL RESECTION OF BLADDER TUMOR;  Surgeon: Jerilee Field, MD;  Location: [facility];  Service: Urology;  Laterality: N/A;    Home Medications:  Medications Prior to Admission  Medication Sig Dispense Refill Last Dose   acetaminophen (TYLENOL) 500 MG tablet Take 1,000 mg by mouth every 6 (six) hours as needed.   05/04/2023   B Complex-C (B-COMPLEX WITH VITAMIN C) tablet Take 1 tablet by mouth daily.   05/04/2023   Cholecalciferol (VITAMIN D-3 PO) Take by mouth daily.   05/04/2023   fluticasone (FLONASE) 50 MCG/ACT nasal spray Place 1 spray into both nostrils daily as needed for allergies or rhinitis.   Past Month   glipiZIDE (GLUCOTROL) 5 MG tablet  TAKE 1/2 TABLET BY MOUTH EVERY EVENING 45 tablet 3 05/04/2023   glucose blood test strip 1 each by Other route daily. Use daily for glucose control - Dx 250.00 100 each 0 05/04/2023   hydrochlorothiazide (MICROZIDE) 12.5 MG capsule Take 1 capsule (12.5 mg total) by mouth daily. 30 capsule 11 05/04/2023   lisinopril (ZESTRIL) 40 MG tablet Take 1 tablet (40 mg total) by mouth daily. 90 tablet 3 05/04/2023   metFORMIN  (GLUCOPHAGE-XR) 500 MG 24 hr tablet Take 4 tablets (2,000 mg total) by mouth daily with supper. 360 tablet 3 05/04/2023   metoprolol succinate (TOPROL-XL) 25 MG 24 hr tablet TAKE ONE TABLET BY MOUTH ONCE DAILY (Patient taking differently: at bedtime.) 90 tablet 3 05/04/2023 at 2200   pembrolizumab (KEYTRUDA) 100 MG/4ML SOLN Inject 2 mg/kg into the vein.   Past Month   propranolol (INDERAL) 10 MG tablet TAKE 1 TABLET BY MOUTH 4 TIMES DAILY AS NEEDED FOR PALPITATIONS, IRREGULAR OR FAST HEART RATE 270 tablet 1 05/04/2023   sildenafil (REVATIO) 20 MG tablet TAKE 1 TO 5 TABLETS 2 TO 3 HOURS BEFORE SEX 60 tablet 5 Past Month   tamsulosin (FLOMAX) 0.4 MG CAPS capsule Take 0.4 mg by mouth at bedtime.   05/04/2023   apixaban (ELIQUIS) 5 MG TABS tablet Take 1 tablet (5 mg total) by mouth 2 (two) times daily. 60 tablet 5 05/02/2023   atorvastatin (LIPITOR) 40 MG tablet TAKE ONE TABLET BY MOUTH ONCE DAILY (Patient taking differently: at bedtime.) 90 tablet 1 05/03/2023   Multiple Vitamins-Minerals (EMERGEN-C VITAMIN C) PACK Take 1 packet by mouth daily as needed (immune support).   More than a month   Semaglutide, 1 MG/DOSE, (OZEMPIC, 1 MG/DOSE,) 4 MG/3ML SOPN INJECT 1 MG ONCE A WEEK AS DIRECTED (Patient taking differently: TUESDAY) 3 mL 2 04/25/2023   traMADol (ULTRAM) 50 MG tablet Take 1 tablet (50 mg total) by mouth every 6 (six) hours as needed for severe pain. (Patient not taking: Reported on 04/24/2023) 20 tablet 0 Not Taking   Allergies: No Known Allergies  Family History  Problem Relation Age of Onset   Pancreatic cancer Mother 58   Cancer Paternal Uncle        unknown type cancer   Aortic aneurysm Other        family history   Cancer Other        Breast (1st degree relative)   Colon cancer Neg Hx    Rectal cancer Neg Hx    Stomach cancer Neg Hx    Esophageal cancer Neg Hx    Social History:  reports that he has been smoking cigars. He has never used smokeless tobacco. He reports current alcohol use. He  reports that he does not use drugs.  ROS: A complete review of systems was performed.  All systems are negative except for pertinent findings as noted. Review of Systems  All other systems reviewed and are negative.    Physical Exam:  Vital signs in last 24 hours: Temp:  [97.6 F (36.4 C)] 97.6 F (36.4 C) (07/12 0556) Pulse Rate:  [66] 66 (07/12 0556) Resp:  [17] 17 (07/12 0556) BP: (134)/(72) 134/72 (07/12 0556) SpO2:  [98 %] 98 % (07/12 0556) Weight:  [94.2 kg] 94.2 kg (07/12 0556) General:  Alert and oriented, No acute distress HEENT: Normocephalic, atraumatic Cardiovascular: Regular rate and rhythm Lungs: Regular rate and effort Abdomen: Soft, nontender, nondistended, no abdominal masses Back: No CVA tenderness Extremities: No edema Neurologic: Grossly intact  Laboratory Data:  Results for orders placed or performed during the hospital encounter of 05/05/23 (from the past 24 hour(s))  I-STAT, chem 8     Status: Abnormal   Collection Time: 05/05/23  6:10 AM  Result Value Ref Range   Sodium 141 135 - 145 mmol/L   Potassium 4.3 3.5 - 5.1 mmol/L   Chloride 107 98 - 111 mmol/L   BUN 22 8 - 23 mg/dL   Creatinine, Ser 4.09 0.61 - 1.24 mg/dL   Glucose, Bld 811 (H) 70 - 99 mg/dL   Calcium, Ion 9.14 1.15 - 1.40 mmol/L   TCO2 26 22 - 32 mmol/L   Hemoglobin 15.3 13.0 - 17.0 g/dL   HCT 78.2 95.6 - 21.3 %   No results found for this or any previous visit (from the past 240 hour(s)). Creatinine: Recent Labs    05/05/23 0610  CREATININE 0.80    Impression/Assessment:  History of high-grade TA and T1 of the bladder, high-grade papillary right distal ureter, papillary tumor right renal pelvis-  Plan:  I discussed with the patient the nature, potential benefits, risks and alternatives to  bilateral retrogrades, bladder biopsy or TURBT, right ureteroscopy possible left ureteroscopy, including side effects of the proposed treatment, the likelihood of the patient achieving the  goals of the procedure, and any potential problems that might occur during the procedure or recuperation.  We discussed possible need for ureteral stents and/or Foley catheter.  All questions answered. Patient elects to proceed.   Jerilee Field 05/05/2023, 7:24 AM

## 2023-05-05 NOTE — Transfer of Care (Signed)
Immediate Anesthesia Transfer of Care Note  Patient: Robert [name]  Procedure(s) Performed: TRANSURETHRAL RESECTION OF BLADDER TUMOR (TURBT) BLADDER BIOSPY (Bladder) CYSTOSCOPY BILATERAL RETROGRADE PYELOGRAM RIGHT URETEROSCOPY RIGHT STENT PLACEMENT (Bilateral: Renal) TRANSURETHRAL RESECTION OF THE PROSTATE (TURP) (Prostate)  Patient Location: PACU  Anesthesia Type:General  Level of Consciousness: awake, alert , oriented, and patient cooperative  Airway & Oxygen Therapy: Patient Spontanous Breathing  Post-op Assessment: Report given to RN and Post -op Vital signs reviewed and stable  Post vital signs: Reviewed and stable  Last Vitals:  Vitals Value Taken Time  BP 122/62 05/05/23 0900  Temp 36.6 C 05/05/23 0900  Pulse 79 05/05/23 0903  Resp 25 05/05/23 0903  SpO2 90 % 05/05/23 0903  Vitals shown include unfiled device data.  Last Pain:  Vitals:   05/05/23 0556  TempSrc: Oral  PainSc: 0-No pain      Patients Stated Pain Goal: 2 (05/05/23 0556)  Complications: No notable events documented.

## 2023-05-05 NOTE — Anesthesia Procedure Notes (Signed)
Procedure Name: Intubation Date/Time: 05/05/2023 7:42 AM  Performed by: Bishop Limbo, CRNAPre-anesthesia Checklist: Patient identified, Emergency Drugs available, Suction available and Patient being monitored Patient Re-evaluated:Patient Re-evaluated prior to induction Oxygen Delivery Method: Circle System Utilized Preoxygenation: Pre-oxygenation with 100% oxygen Induction Type: IV induction Ventilation: Mask ventilation without difficulty Laryngoscope Size: Mac and 4 Grade View: Grade I Tube type: Oral Tube size: 7.0 mm Number of attempts: 1 Airway Equipment and Method: Stylet and Bite block Placement Confirmation: ETT inserted through vocal cords under direct vision, positive ETCO2 and breath sounds checked- equal and bilateral Secured at: 22 cm Tube secured with: Tape Dental Injury: Teeth and Oropharynx as per pre-operative assessment

## 2023-05-05 NOTE — Op Note (Addendum)
Preoperative diagnosis: Bladder cancer posterior, right ureteral cancer, right renal pelvis cancer  Postoperative diagnosis: Same  Procedure:  1.Cystoscopy 2. left retrograde pyelogram 3. right diagnostic ureteroscopy 4. TURBT 2 to 5 cm 5. TURP 6. 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  He has failed BCG x 2 and then undergone some cycles of Keytruda.  He is brought today for repeat surveillance and biopsies.  Findings: On exam under anesthesia the penis was circumcised without mass or lesion.  The glans and meatus appeared normal.  On DRE prostate was about 40 g and smooth without hard area or nodule.  No palpable bladder or pelvic mass.  On cystoscopy there was friable papillary tumor creeping from the right bladder neck up to 12:00 and into the prostatic urethra for about 2 - 3 cm. There was recurrent right tumor just posterior to the right ureteral orifice.  Left retrograde pyelogram-this outlined a single ureter single collecting system unit without filling defect, stricture or dilation.  Right retrograde pyelogram-this outlined filling defects along most of the distal ureter with proximal hydroureteronephrosis and some slight tortuosity.  On Right ureteroscopy there was papillary tumor filling most of the right distal ureter.  There was progression of tumor in the right renal pelvis.  Description of procedure: After consent was obtained patient brought to the operating room.  After adequate anesthesia he was placed lithotomy position and prepped and draped in the usual sterile fashion.  Timeout was performed to confirm the patient and procedure.  The cystoscope was passed per urethra and the bladder carefully inspected.  The left ureteral  orifice was cannulated with a 5 [place] open-ended catheter and left retrograde injection of contrast was performed.  I could see tumor in the right distal ureter from the prior resection the right distal ureteral orifice is open.  Therefore I took a semirigid scope and went up into the right distal ureter where the tumor was noted.  Retrograde injection of contrast noted most filling defects in the right distal ureter and contrast and quite progressed past the iliacs.  I passed a sensor wire and followed that up into the proximal ureter and injected more contrast and showed some slight tortuosity.  I then backed the semirigid scope out and passed the digital scope up into the right collecting system.  This revealed increased papillary tumor in the right renal pelvis at the UPJ.  Greater than 10 mm.  I then passed a wire up through the rigid ureteroscope and backed the scope out.  I then took the continuous-flow sheath and passed that into the bladder and then the loop and handle.  The right posterior bladder was resected.  I then resected the bladder neck up into the prostatic urethra on both sides for 2-3 cm resection. Hemostasis was excellent at low pressure.  There was still some papillary changes early in the right posterior bladder and likely at the bladder neck.  The wire was then backloaded on the cystoscope and a 6 x 26 cm right ureteral stent was advanced.  Good coil seen in the right kidney and a good coil in the bladder.  The scope was backed out an 86 [place] Foley catheter placed and left to gravity drainage.  He was awakened and taken to  the cover room in stable condition.  Complications: None  Blood loss: Minimal  Specimens to pathology: #1 right posterior bladder tumor #2 bladder neck and prostatic urethra  Drains: 6 x 26 cm right ureteral stent, 18 French Foley catheter  Disposition: Patient stable to PACU-I discussed the procedure, postop care and follow-up with his friend Johnny Bridge.   Unfortunately his wife Geraldine Contras has progressing dementia.

## 2023-05-08 ENCOUNTER — Encounter (HOSPITAL_BASED_OUTPATIENT_CLINIC_OR_DEPARTMENT_OTHER): Payer: Self-pay | Admitting: Urology

## 2023-05-08 DIAGNOSIS — R338 Other retention of urine: Secondary | ICD-10-CM | POA: Diagnosis not present

## 2023-05-08 LAB — SURGICAL PATHOLOGY

## 2023-05-16 ENCOUNTER — Telehealth: Payer: Self-pay | Admitting: Hematology

## 2023-05-18 ENCOUNTER — Ambulatory Visit: Payer: Medicare HMO | Admitting: Hematology

## 2023-05-18 ENCOUNTER — Other Ambulatory Visit: Payer: Medicare HMO

## 2023-05-18 ENCOUNTER — Ambulatory Visit: Payer: Medicare HMO

## 2023-05-23 NOTE — Progress Notes (Unsigned)
[facility]   Telephone:(336) [phone] Fax:([phone]) [phone]   Clinic Follow up Note   Patient Care Team: Plotnikov, [name], MD as PCP - General (Internal Medicine) Nahser, Deloris Ping, MD as PCP - Cardiology (Cardiology) Verner Chol, [facility] (Inactive) as Pharmacist (Pharmacist) Carlus Pavlov, MD as Consulting Physician (Internal Medicine) Jerilee Field, MD as Consulting Physician (Urology) Iva Boop, MD as Consulting Physician (Gastroenterology) Janet Berlin, MD as Consulting Physician (Ophthalmology) Malachy Mood, MD as Attending Physician (Hematology and Oncology)  Date of Service:  05/24/2023  I connected with [name] on 05/24/2023 at 10:00 AM EDT by telephone visit and verified that I am speaking with the correct person using two identifiers.  I discussed the limitations, risks, security and privacy concerns of performing an evaluation and management service by telephone and the availability of in person appointments. I also discussed with the patient that there may be a patient responsible charge related to this service. The patient expressed understanding and agreed to proceed.   Other persons participating in the visit and their role in the encounter:  No  Patient's location:  Home Provider's location:  [facility] Office  CHIEF COMPLAINT: f/u of bladder cancer   CURRENT THERAPY:  Keytruda   ASSESSMENT & PLAN:  Robert [name] is a 72 y.o. 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 -He is clinically doing well, asymptomatic.  Surgical resection was discussed with patient, but he would like to try nonsurgical options. -I discussed intravesical therapy, such as gemcitabine or mitomycin, unfortunately it would not reach to the ureter and renal pelvis to treat all the disease that  he has. -I recommend systemic therapy with immunotherapy pembrolizumab 200 mg every 3 weeks, or 400 mg every 6 weeks if he tolerates well.  This has been studied in the keynote 057 trial, it demonstrated 39% complete response in 3 months in BCG unresponsive high risk nonmuscle invasive bladder cancer. -He started Keytruda on 01/12/23, tolerated first cycle well except a mild fatigue.  No other noticeable side effects.  We again reviewed potential AE's, and what to watch.  He voices good understanding. -He will follow-up with urologist Dr. 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 She agrees to proceed. -The goal of chemotherapy is curative -Patient is scheduled to see his urologist Dr. Lennox Laity later today, he agrees to proceed with chemotherapy.  Social support -His wife has dementia -He has hip replacement scheduled for later August, which he will defer due to the upcoming chemotherapy -He has a son and friends who can help him during the chemotherapy.  PLAN: -recommend neoadjuvant chemotherapy and surgery -I discussed chemotherapy regimen to cisplatin and gemcitabine -Pt agree to treatment -I discuss Chemo education and Port placement by IR -Looking to start  the week of  8/12 -I will order CT CAP in 1 week    SUMMARY OF ONCOLOGIC HISTORY: Oncology History Overview Note   Cancer Staging  Bladder cancer [facility]) Staging form: Urinary Bladder, AJCC 8th Edition - Clinical stage from 05/14/2021: Stage 0a (cTa, cN0, cM0) - Signed by Malachy Mood, MD on 01/02/2023 Stage prefix: Initial diagnosis WHO/ISUP grade (low/high): High Grade Histologic grading system: 2 grade system - Pathologic stage from 02/22/2022: Stage I (pT1, pN0, cM0) - Signed by Malachy Mood, MD on 01/02/2023 WHO/ISUP grade (low/high): High Grade Histologic grading system: 2 grade system     Bladder cancer (HCC)  05/14/2021 Cancer Staging   Staging form: Urinary Bladder, AJCC 8th Edition - Clinical stage from 05/14/2021: Stage 0a (cTa, cN0, cM0) - Signed by Malachy Mood, MD on 01/02/2023 Stage prefix: Initial diagnosis WHO/ISUP grade (low/high): High Grade Histologic grading system: 2 grade system   01/31/2022 Initial Diagnosis   Bladder cancer (HCC)   02/22/2022 Cancer Staging   Staging form: Urinary Bladder, AJCC 8th Edition - Pathologic stage from 02/22/2022: Stage I (pT1, pN0, cM0) - Signed by Malachy Mood, MD on 01/02/2023 WHO/ISUP grade (low/high): High Grade Histologic grading system: 2 grade system   01/12/2023 - 04/06/2023 Chemotherapy   Patient is on Treatment Plan : BLADDER Pembrolizumab (200) q21d     05/05/2023 Pathology Results     FINAL MICROSCOPIC DIAGNOSIS:   A. BLADDER, RIGHT POSTERIOR, TURBT:  Noninvasive high grade papillary urothelial carcinoma  Muscularis propria (detrusor muscle) is not present   B. BLADDER NECK AND PROSTATIC URETHRA, BIOPSY:  High grade urothelial carcinoma  The carcinoma focally invades muscularis propria (detrusor muscle)     06/08/2023 -  Chemotherapy   Patient is on Treatment Plan : BLADDER Cisplatin D1 + Gemcitabine D1,8 q21d x 4 Cycles        INTERVAL HISTORY:  Robert [name] was contacted for a follow up of bladder cancer . He was last seen by  me on 04/06/2023. Pt state he has an appointment wit Dr.Eskridge, today 7/31   All other systems were reviewed with the patient and are negative.  MEDICAL HISTORY:  Past Medical History:  Diagnosis Date   Anticoagulant long-term use    eliquis--- managed by cardiology   Arthritis    Bladder cancer [facility])    urologist--- dr Mena Goes;  overlapping   History of basal cell carcinoma (BCC) excision    History of COVID-19 05/2019   per pt mild symptoms that resolved   HLD (hyperlipidemia)    HTN (hypertension)    Hx of colonic polyps    Insulin dependent type 2 diabetes mellitus [facility])    endocrinologist--- dr Elvera Lennox --  (03-11-2022 per pt check blood sugar 1-2 times daily,  fasting sugar-- 118-120s)   Nocturia    Paroxysmal atrial fibrillation (HCC) 03/12/2019   cardiologist--- dr Melburn Popper   Ureterocele    Use of cane as ambulatory aid     SURGICAL HISTORY: Past Surgical History:  Procedure Laterality Date   BLEPHAROPLASTY Bilateral    per pt approx  2001;   upper eyelid's   COLONOSCOPY     last one approx 2023   CYSTOSCOPY WITH URETEROSCOPY AND STENT PLACEMENT Right 12/02/2022   Procedure: CYSTOSCOPY WITH RIGHT URETEROSCOPY, RIGHT DIAGNOSTIC URETEROSCOPY;  Surgeon: Jerilee Field, MD;  Location: [facility];  Service: Urology;  Laterality: Right;  90 MINS FOR CASE   CYSTOSCOPY/RETROGRADE/URETEROSCOPY Bilateral 05/05/2023   Procedure: CYSTOSCOPY BILATERAL RETROGRADE PYELOGRAM RIGHT URETEROSCOPY RIGHT STENT  PLACEMENT;  Surgeon: Jerilee Field, MD;  Location: [facility];  Service: Urology;  Laterality: Bilateral;  90 MINS FOR CASE   PATELLAR TENDON REPAIR Left 04/16/2004   @[facility]    TRANSURETHRAL RESECTION OF BLADDER TUMOR N/A 06/18/2021   Procedure: TRANSURETHRAL RESECTION OF BLADDER TUMOR (TURBT) WITH RIGHT URETERAL STENT PLACEMENT, RIGHT URETEROSCOPY WITH DISTRACTION OF TUMOR, FULGURATION;  Surgeon: Jerilee Field, MD;  Location: [facility];  Service:  Urology;  Laterality: N/A;   TRANSURETHRAL RESECTION OF BLADDER TUMOR N/A 03/15/2022   Procedure: TRANSURETHRAL RESECTION OF BLADDER TUMOR (TURBT) WITH BILATERAL URETEROSCOPY/URETHRAL/ DILATION/BILATERAL RETROGRADE PYELOGRAM/BIOPSY AND FULGURATION OF RIGHT URETERAL CANCER/RIGHT STENT PLACEMENT;  Surgeon: Jerilee Field, MD;  Location: [facility];  Service: Urology;  Laterality: N/A;   TRANSURETHRAL RESECTION OF BLADDER TUMOR N/A 05/05/2023   Procedure: TRANSURETHRAL RESECTION OF BLADDER TUMOR (TURBT) BLADDER BIOSPY;  Surgeon: Jerilee Field, MD;  Location: [facility];  Service: Urology;  Laterality: N/A;   TRANSURETHRAL RESECTION OF BLADDER TUMOR WITH MITOMYCIN-C N/A 12/02/2022   Procedure: TRANSURETHRAL RESECTION OF BLADDER TUMOR;  Surgeon: Jerilee Field, MD;  Location: [facility];  Service: Urology;  Laterality: N/A;   TRANSURETHRAL RESECTION OF PROSTATE  05/05/2023   Procedure: TRANSURETHRAL RESECTION OF THE PROSTATE (TURP);  Surgeon: Jerilee Field, MD;  Location: [facility];  Service: Urology;;    I have reviewed the social history and family history with the patient and they are unchanged from previous note.  ALLERGIES:  has No Known Allergies.  MEDICATIONS:  Current Outpatient Medications  Medication Sig Dispense Refill   acetaminophen (TYLENOL) 500 MG tablet Take 1,000 mg by mouth every 6 (six) hours as needed.     apixaban (ELIQUIS) 5 MG TABS tablet Take 1 tablet (5 mg total) by mouth 2 (two) times daily.     atorvastatin (LIPITOR) 40 MG tablet TAKE ONE TABLET BY MOUTH ONCE DAILY (Patient taking differently: at bedtime.) 90 tablet 1   B Complex-C (B-COMPLEX WITH VITAMIN C) tablet Take 1 tablet by mouth daily.     Cholecalciferol (VITAMIN D-3 PO) Take by mouth daily.     fluticasone (FLONASE) 50 MCG/ACT nasal spray Place 1 spray into both nostrils daily as needed for allergies or rhinitis.     glipiZIDE (GLUCOTROL) 5 MG tablet  TAKE 1/2 TABLET BY MOUTH EVERY EVENING 45 tablet 3   glucose blood test strip 1 each by Other route daily. Use daily for glucose control - Dx 250.00 100 each 0   hydrochlorothiazide (MICROZIDE) 12.5 MG capsule Take 1 capsule (12.5 mg total) by mouth daily. 30 capsule 11   lidocaine-prilocaine (EMLA) cream Apply to affected area once 30 g 3   lisinopril (ZESTRIL) 40 MG tablet Take 1 tablet (40 mg total) by mouth daily. 90 tablet 3   metFORMIN (GLUCOPHAGE-XR) 500 MG 24 hr tablet Take 4 tablets (2,000 mg total) by mouth daily with supper. 360 tablet 3   metoprolol succinate (TOPROL-XL) 25 MG 24 hr tablet TAKE ONE TABLET BY MOUTH ONCE DAILY (Patient taking differently: at bedtime.) 90 tablet 3   Multiple Vitamins-Minerals (EMERGEN-C VITAMIN C) PACK Take 1 packet by mouth daily as needed (immune support).     ondansetron (ZOFRAN) 8 MG tablet Take 1 tablet (8 mg total) by mouth every 8 (eight) hours as needed for nausea or vomiting. Start on the third day after cisplatin. 30 tablet 1   pembrolizumab (KEYTRUDA) 100 MG/4ML SOLN Inject 2 mg/kg into the vein.  prochlorperazine (COMPAZINE) 10 MG tablet Take 1 tablet (10 mg total) by mouth every 6 (six) hours as needed (Nausea or vomiting). 30 tablet 1   propranolol (INDERAL) 10 MG tablet TAKE 1 TABLET BY MOUTH 4 TIMES DAILY AS NEEDED FOR PALPITATIONS, IRREGULAR OR FAST HEART RATE 270 tablet 1   Semaglutide, 1 MG/DOSE, (OZEMPIC, 1 MG/DOSE,) 4 MG/3ML SOPN INJECT 1 MG ONCE A WEEK AS DIRECTED (Patient taking differently: TUESDAY) 3 mL 2   sildenafil (REVATIO) 20 MG tablet TAKE 1 TO 5 TABLETS 2 TO 3 HOURS BEFORE SEX 60 tablet 5   tamsulosin (FLOMAX) 0.4 MG CAPS capsule Take 0.4 mg by mouth at bedtime.     traMADol (ULTRAM) 50 MG tablet Take 1 tablet (50 mg total) by mouth every 6 (six) hours as needed for severe pain. (Patient not taking: Reported on 04/24/2023) 20 tablet 0   No current facility-administered medications for this visit.    PHYSICAL  EXAMINATION: ECOG PERFORMANCE STATUS: 1 - Symptomatic but completely ambulatory  There were no vitals filed for this visit. Wt Readings from Last 3 Encounters:  05/05/23 207 lb 9.6 oz (94.2 kg)  04/06/23 207 lb 11.2 oz (94.2 kg)  03/31/23 212 lb (96.2 kg)     No vitals taken today, Exam not performed today  LABORATORY DATA:  I have reviewed the data as listed    Latest Ref Rng & Units 05/05/2023    6:10 AM 04/06/2023    1:30 PM 03/16/2023   12:51 PM  CBC  WBC 4.0 - 10.5 K/uL  9.6  10.8   Hemoglobin 13.0 - 17.0 g/dL 81.0  17.5  10.2   Hematocrit 39.0 - 52.0 % 45.0  46.6  45.3   Platelets 150 - 400 K/uL  195  190         Latest Ref Rng & Units 05/05/2023    6:10 AM 04/06/2023    1:30 PM 03/16/2023   12:51 PM  CMP  Glucose 70 - 99 mg/dL 585  277  824   BUN 8 - 23 mg/dL 22  15  17    Creatinine 0.61 - 1.24 mg/dL 2.35  3.61  4.43   Sodium 135 - 145 mmol/L 141  136  137   Potassium 3.5 - 5.1 mmol/L 4.3  4.2  4.4   Chloride 98 - 111 mmol/L 107  105  103   CO2 22 - 32 mmol/L  24  26   Calcium 8.9 - 10.3 mg/dL  9.7  9.7   Total Protein 6.5 - 8.1 g/dL  7.2  6.9   Total Bilirubin 0.3 - 1.2 mg/dL  0.5  0.5   Alkaline Phos 38 - 126 U/L  74  72   AST 15 - 41 U/L  14  15   ALT 0 - 44 U/L  12  14       RADIOGRAPHIC STUDIES: I have personally reviewed the radiological images as listed and agreed with the findings in the report. No results found.    Orders Placed This Encounter  Procedures   IR IMAGING GUIDED PORT INSERTION    Standing Status:   Future    Standing Expiration Date:   05/23/2024    Order Specific Question:   Reason for Exam (SYMPTOM  OR DIAGNOSIS REQUIRED)    Answer:   chemotherapy  looking to start on the of  8/12    Order Specific Question:   Preferred Imaging Location?    Answer:   [facility]    Order Specific Question:   Release to patient    Answer:   Immediate    Order Specific Question:   Call Results- Best Contact Number?    Answer:    [phone]   CBC with Differential (Cancer Center Only)    Standing Status:   Future    Standing Expiration Date:   06/07/2024   CMP (Cancer Center only)    Standing Status:   Future    Standing Expiration Date:   06/07/2024   Magnesium    Standing Status:   Future    Standing Expiration Date:   06/07/2024   CBC with Differential (Cancer Center Only)    Standing Status:   Future    Standing Expiration Date:   06/14/2024   CMP (Cancer Center only)    Standing Status:   Future    Standing Expiration Date:   06/14/2024   CBC with Differential (Cancer Center Only)    Standing Status:   Future    Standing Expiration Date:   06/28/2024   CMP (Cancer Center only)    Standing Status:   Future    Standing Expiration Date:   06/28/2024   Magnesium    Standing Status:   Future    Standing Expiration Date:   06/28/2024   CBC with Differential (Cancer Center Only)    Standing Status:   Future    Standing Expiration Date:   07/05/2024   CMP (Cancer Center only)    Standing Status:   Future    Standing Expiration Date:   07/05/2024   All questions were answered. The patient knows to call the clinic with any problems, questions or concerns. No barriers to learning was detected. The total time spent in the appointment was 45 minutes.     Malachy Mood, MD 05/24/2023   Carolin Coy am acting as scribe for Malachy Mood, MD.   I have reviewed the above documentation for accuracy and completeness, and I agree with the above.

## 2023-05-23 NOTE — Assessment & Plan Note (Signed)
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 -He is clinically doing well, asymptomatic.  Surgical resection was discussed with patient, but he would like to try nonsurgical options. -I discussed intravesical therapy, such as gemcitabine or mitomycin, unfortunately it would not reach to the ureter and renal pelvis to treat all the disease that he has. -I recommend systemic therapy with immunotherapy pembrolizumab 200 mg every 3 weeks, or 400 mg every 6 weeks if he tolerates well.  This has been studied in the keynote 057 trial, it demonstrated 39% complete response in 3 months in BCG unresponsive high risk nonmuscle invasive bladder cancer. -He started Keytruda on 01/12/23, tolerated first cycle well except a mild fatigue.  No other noticeable side effects.  We again reviewed potential AE's, and what to watch.  He voices good understanding. -He will follow-up with urologist Dr. 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

## 2023-05-24 ENCOUNTER — Inpatient Hospital Stay: Payer: Medicare HMO | Attending: Hematology | Admitting: Hematology

## 2023-05-24 ENCOUNTER — Encounter: Payer: Self-pay | Admitting: Hematology

## 2023-05-24 ENCOUNTER — Other Ambulatory Visit: Payer: Self-pay

## 2023-05-24 DIAGNOSIS — C679 Malignant neoplasm of bladder, unspecified: Secondary | ICD-10-CM

## 2023-05-24 DIAGNOSIS — R8271 Bacteriuria: Secondary | ICD-10-CM | POA: Diagnosis not present

## 2023-05-24 DIAGNOSIS — C661 Malignant neoplasm of right ureter: Secondary | ICD-10-CM | POA: Diagnosis not present

## 2023-05-24 DIAGNOSIS — C675 Malignant neoplasm of bladder neck: Secondary | ICD-10-CM | POA: Diagnosis not present

## 2023-05-24 DIAGNOSIS — C678 Malignant neoplasm of overlapping sites of bladder: Secondary | ICD-10-CM | POA: Diagnosis not present

## 2023-05-24 DIAGNOSIS — R338 Other retention of urine: Secondary | ICD-10-CM | POA: Diagnosis not present

## 2023-05-24 MED ORDER — PROCHLORPERAZINE MALEATE 10 MG PO TABS
10.0000 mg | ORAL_TABLET | Freq: Four times a day (QID) | ORAL | 1 refills | Status: DC | PRN
Start: 2023-05-24 — End: 2023-10-03

## 2023-05-24 MED ORDER — LIDOCAINE-PRILOCAINE 2.5-2.5 % EX CREA
TOPICAL_CREAM | CUTANEOUS | 3 refills | Status: DC
Start: 2023-05-24 — End: 2023-10-03

## 2023-05-24 MED ORDER — ONDANSETRON HCL 8 MG PO TABS: 8.0000 mg | ORAL_TABLET | Freq: Three times a day (TID) | ORAL | 1 refills | Status: DC | PRN

## 2023-05-24 NOTE — Progress Notes (Signed)
DISCONTINUE ON PATHWAY REGIMEN - Bladder     A cycle is every 21 days:     Pembrolizumab   **Always confirm dose/schedule in your pharmacy ordering system**  REASON: Disease Progression PRIOR TREATMENT: [id number]: Pembrolizumab 200 mg q21 Days Until Progression,  Unacceptable Toxicity, or up to 24 Months TREATMENT RESPONSE: Unable to Evaluate  START ON PATHWAY REGIMEN - Bladder     A cycle is every 21 days:     Gemcitabine      Cisplatin   **Always confirm dose/schedule in your pharmacy ordering system**  Patient Characteristics: Pre-Cystectomy or Nonsurgical Candidate, M0 (Clinical Staging), cT2-4a, cN0-1, M0, Cystectomy Eligible, Cisplatin-Based Chemotherapy Indicated with CrCl ? 60 mL/min and Minimal or No Symptoms Therapeutic Status: Pre-Cystectomy or Nonsurgical Candidate, M0 (Clinical Staging) AJCC M Category: cM0 AJCC 8 Stage Grouping: II AJCC T Category: cT2 AJCC N Category: cN0 Intent of Therapy: Curative Intent, Discussed with Patient

## 2023-05-26 ENCOUNTER — Encounter: Payer: Self-pay | Admitting: Hematology

## 2023-05-26 ENCOUNTER — Other Ambulatory Visit: Payer: Self-pay

## 2023-05-29 ENCOUNTER — Other Ambulatory Visit: Payer: Self-pay | Admitting: Radiology

## 2023-05-29 NOTE — Consult Note (Signed)
Chief Complaint: Patient was seen in consultation today for Port-A-Cath placement  Referring Physician(s): Feng,Yan  Supervising Physician: Marliss Coots  Patient Status: [facility] - Out-pt  History of Present Illness: Robert [name] is a 72 y.o. male with past medical history of arthritis, skin cancer, hyperlipidemia, hypertension, colon polyps, diabetes, paroxysmal atrial fibrillation who presents now with recurrent bladder cancer (originally diagnosed 2022).  He is scheduled today for Port-A-Cath placement to assist with treatment.  Past Medical History:  Diagnosis Date   Anticoagulant long-term use    eliquis--- managed by cardiology   Arthritis    Bladder cancer [facility])    urologist--- dr Mena Goes;  overlapping   History of basal cell carcinoma (BCC) excision    History of COVID-19 05/2019   per pt mild symptoms that resolved   HLD (hyperlipidemia)    HTN (hypertension)    Hx of colonic polyps    Insulin dependent type 2 diabetes mellitus [facility])    endocrinologist--- dr Elvera Lennox --  (03-11-2022 per pt check blood sugar 1-2 times daily,  fasting sugar-- 118-120s)   Nocturia    Paroxysmal atrial fibrillation (HCC) 03/12/2019   cardiologist--- dr Melburn Popper   Ureterocele    Use of cane as ambulatory aid     Past Surgical History:  Procedure Laterality Date   BLEPHAROPLASTY Bilateral    per pt approx  2001;   upper eyelid's   COLONOSCOPY     last one approx 2023   CYSTOSCOPY WITH URETEROSCOPY AND STENT PLACEMENT Right 12/02/2022   Procedure: CYSTOSCOPY WITH RIGHT URETEROSCOPY, RIGHT DIAGNOSTIC URETEROSCOPY;  Surgeon: Jerilee Field, MD;  Location: [facility];  Service: Urology;  Laterality: Right;  90 MINS FOR CASE   CYSTOSCOPY/RETROGRADE/URETEROSCOPY Bilateral 05/05/2023   Procedure: CYSTOSCOPY BILATERAL RETROGRADE PYELOGRAM RIGHT URETEROSCOPY RIGHT STENT PLACEMENT;  Surgeon: Jerilee Field, MD;  Location: [facility];  Service: Urology;  Laterality:  Bilateral;  90 MINS FOR CASE   PATELLAR TENDON REPAIR Left 04/16/2004   @[facility]    TRANSURETHRAL RESECTION OF BLADDER TUMOR N/A 06/18/2021   Procedure: TRANSURETHRAL RESECTION OF BLADDER TUMOR (TURBT) WITH RIGHT URETERAL STENT PLACEMENT, RIGHT URETEROSCOPY WITH DISTRACTION OF TUMOR, FULGURATION;  Surgeon: Jerilee Field, MD;  Location: [facility];  Service: Urology;  Laterality: N/A;   TRANSURETHRAL RESECTION OF BLADDER TUMOR N/A 03/15/2022   Procedure: TRANSURETHRAL RESECTION OF BLADDER TUMOR (TURBT) WITH BILATERAL URETEROSCOPY/URETHRAL/ DILATION/BILATERAL RETROGRADE PYELOGRAM/BIOPSY AND FULGURATION OF RIGHT URETERAL CANCER/RIGHT STENT PLACEMENT;  Surgeon: Jerilee Field, MD;  Location: [facility];  Service: Urology;  Laterality: N/A;   TRANSURETHRAL RESECTION OF BLADDER TUMOR N/A 05/05/2023   Procedure: TRANSURETHRAL RESECTION OF BLADDER TUMOR (TURBT) BLADDER BIOSPY;  Surgeon: Jerilee Field, MD;  Location: [facility];  Service: Urology;  Laterality: N/A;   TRANSURETHRAL RESECTION OF BLADDER TUMOR WITH MITOMYCIN-C N/A 12/02/2022   Procedure: TRANSURETHRAL RESECTION OF BLADDER TUMOR;  Surgeon: Jerilee Field, MD;  Location: [facility];  Service: Urology;  Laterality: N/A;   TRANSURETHRAL RESECTION OF PROSTATE  05/05/2023   Procedure: TRANSURETHRAL RESECTION OF THE PROSTATE (TURP);  Surgeon: Jerilee Field, MD;  Location: [facility];  Service: Urology;;    Allergies: Patient has no known allergies.  Medications: Prior to Admission medications   Medication Sig Start Date End Date Taking? Authorizing Provider  acetaminophen (TYLENOL) 500 MG tablet Take 1,000 mg by mouth every 6 (six) hours as needed.    [provider]  apixaban (ELIQUIS) 5 MG TABS tablet Take 1 tablet (5  mg total) by mouth 2 (two) times daily. 05/08/23   Jerilee Field, MD  atorvastatin (LIPITOR) 40 MG tablet TAKE ONE TABLET BY MOUTH ONCE  DAILY Patient taking differently: at bedtime. 03/06/23   Carlus Pavlov, MD  B Complex-C (B-COMPLEX WITH VITAMIN C) tablet Take 1 tablet by mouth daily.    [provider]  Cholecalciferol (VITAMIN D-3 PO) Take by mouth daily.    [provider]  fluticasone (FLONASE) 50 MCG/ACT nasal spray Place 1 spray into both nostrils daily as needed for allergies or rhinitis.    [provider]  glipiZIDE (GLUCOTROL) 5 MG tablet TAKE 1/2 TABLET BY MOUTH EVERY EVENING 02/09/23   Carlus Pavlov, MD  glucose blood test strip 1 each by Other route daily. Use daily for glucose control - Dx 250.00 04/21/20   [name], [name], MD  hydrochlorothiazide (MICROZIDE) 12.5 MG capsule Take 1 capsule (12.5 mg total) by mouth daily. 10/25/22   Plotnikov, [name], MD  lidocaine-prilocaine (EMLA) cream Apply to affected area once 05/24/23   Malachy Mood, MD  lisinopril (ZESTRIL) 40 MG tablet Take 1 tablet (40 mg total) by mouth daily. 08/30/22   Nahser, Deloris Ping, MD  metFORMIN (GLUCOPHAGE-XR) 500 MG 24 hr tablet Take 4 tablets (2,000 mg total) by mouth daily with supper. 02/03/23   Carlus Pavlov, MD  metoprolol succinate (TOPROL-XL) 25 MG 24 hr tablet TAKE ONE TABLET BY MOUTH ONCE DAILY Patient taking differently: at bedtime. 09/06/22   Nahser, Deloris Ping, MD  Multiple Vitamins-Minerals (EMERGEN-C VITAMIN C) PACK Take 1 packet by mouth daily as needed (immune support).    [provider]  ondansetron (ZOFRAN) 8 MG tablet Take 1 tablet (8 mg total) by mouth every 8 (eight) hours as needed for nausea or vomiting. Start on the third day after cisplatin. 05/24/23   Malachy Mood, MD  pembrolizumab [facility]) 100 MG/4ML SOLN Inject 2 mg/kg into the vein.    [provider]  prochlorperazine (COMPAZINE) 10 MG tablet Take 1 tablet (10 mg total) by mouth every 6 (six) hours as needed (Nausea or vomiting). 05/24/23   Malachy Mood, MD  propranolol (INDERAL) 10 MG tablet TAKE 1 TABLET BY MOUTH  4 TIMES DAILY AS NEEDED FOR PALPITATIONS, IRREGULAR OR FAST HEART RATE 10/25/22   Nahser, Deloris Ping, MD  Semaglutide, 1 MG/DOSE, (OZEMPIC, 1 MG/DOSE,) 4 MG/3ML SOPN INJECT 1 MG ONCE A WEEK AS DIRECTED Patient taking differently: TUESDAY 04/13/23   Carlus Pavlov, MD  sildenafil (REVATIO) 20 MG tablet TAKE 1 TO 5 TABLETS 2 TO 3 HOURS BEFORE SEX 03/10/23   Plotnikov, [name], MD  tamsulosin (FLOMAX) 0.4 MG CAPS capsule Take 0.4 mg by mouth at bedtime. 01/24/23   [provider]  traMADol (ULTRAM) 50 MG tablet Take 1 tablet (50 mg total) by mouth every 6 (six) hours as needed for severe pain. Patient not taking: Reported on 04/24/2023 02/10/23   Plotnikov, [name], MD     Family History  Problem Relation Age of Onset   Pancreatic cancer Mother 35   Cancer Paternal Uncle        unknown type cancer   Aortic aneurysm Other        family history   Cancer Other        Breast (1st degree relative)   Colon cancer Neg Hx    Rectal cancer Neg Hx    Stomach cancer Neg Hx    Esophageal cancer Neg Hx     Social History  Socioeconomic History   Marital status: Married    Spouse name: Not on file   Number of children: 1   Years of education: Not on file   Highest education level: Not on file  Occupational History   Occupation: DVD's   Occupation: Occupational hygienist: Self Employed  Tobacco Use   Smoking status: Some Days    Types: Cigars    Last attempt to quit: 02/22/2011    Years since quitting: 12.2   Smokeless tobacco: Never  Vaping Use   Vaping status: Never Used  Substance and Sexual Activity   Alcohol use: Yes    Comment: 6-8oz /wk OCC MARTINI 2 TO 3 X WEEK   Drug use: Never   Sexual activity: Not on file  Other Topics Concern   Not on file  Social History Narrative   Patient is married he has been employed in Nurse, children's   Former smoker with occasional alcohol use   Social Determinants of Health   Financial Resource Strain: Low Risk  (03/31/2023)    Overall Financial Resource Strain (CARDIA)    Difficulty of Paying Living Expenses: Not hard at all  Food Insecurity: No Food Insecurity (03/31/2023)   Hunger Vital Sign    Worried About Running Out of Food in the Last Year: Never true    Ran Out of Food in the Last Year: Never true  Transportation Needs: Patient Declined (02/23/2023)   PRAPARE - Transportation    Lack of Transportation (Medical): Patient declined    Lack of Transportation (Non-Medical): Patient declined  Physical Activity: Inactive (03/31/2023)   Exercise Vital Sign    Days of Exercise per Week: 0 days    Minutes of Exercise per Session: 0 min  Stress: No Stress Concern Present (03/31/2023)   [employer] of Occupational Health - Occupational Stress Questionnaire    Feeling of Stress : Not at all  Social Connections: Socially Integrated (03/31/2023)   Social Connection and Isolation Panel [NHANES]    Frequency of Communication with Friends and Family: More than three times a week    Frequency of Social Gatherings with Friends and Family: More than three times a week    Attends Religious Services: More than 4 times per year    Active Member of [employer] or Organizations: Yes    Attends Engineer, structural: More than 4 times per year    Marital Status: Married      Review of Systems: denies fever,HA,CP,dyspnea, cough, abd pain, back pain,N/V or bleeding; does have occ sinus/ear congestion  Vital Signs: Vitals:   05/30/23 1253  BP: 133/67  Pulse: 86  Resp: 18  Temp: 99.4 F (37.4 C)  SpO2: 95%       Code Status: FULL CODE  Advance Care Plan: no documents on file    Physical Exam; awake/alert; chest- CTA bilat; heart- RRR; abd- soft,+BS,NT; no sig LE edema  Imaging: No results found.  Labs:  CBC: Recent Labs    02/23/23 1135 02/27/23 1547 03/16/23 1251 04/06/23 1330 05/05/23 0610  WBC 9.7 10.0 10.8* 9.6  --   HGB 15.9 16.6 16.3 16.6 15.3  HCT 45.0 47.6 45.3 46.6 45.0  PLT 179 212.0  190 195  --     COAGS: No results for input(s): "INR", "APTT" in the last 8760 hours.  BMP: Recent Labs    02/16/23 1305 02/23/23 1135 02/27/23 1547 03/16/23 1251 04/06/23 1330 05/05/23 0610  NA 136 136 137 137 136  141  K 4.2 4.5 4.2 4.4 4.2 4.3  CL 102 102 99 103 105 107  CO2 28 29 30 26 24   --   GLUCOSE 201* 207* 131* 167* 224* 151*  BUN 13 15 14 17 15 22   CALCIUM 9.4 9.3 9.3 9.7 9.7  --   CREATININE 0.89 0.77 0.83 0.94 0.79 0.80  GFRNONAA >60 >60  --  >60 >60  --     LIVER FUNCTION TESTS: Recent Labs    02/23/23 1135 02/27/23 1547 03/16/23 1251 04/06/23 1330  BILITOT 0.7 0.7 0.5 0.5  AST 13* 14 15 14*  ALT 11 14 14 12   ALKPHOS 66 84 72 74  PROT 6.9 6.8 6.9 7.2  ALBUMIN 4.2 4.3 4.3 4.0    TUMOR MARKERS: No results for input(s): "AFPTM", "CEA", "CA199", "CHROMGRNA" in the last 8760 hours.  Assessment and Plan: 72 y.o. male with past medical history of arthritis, skin cancer, hyperlipidemia, hypertension, colon polyps, diabetes, paroxysmal atrial fibrillation who presents now with recurrent bladder cancer (originally diagnosed 2022).  He is scheduled today for Port-A-Cath placement to assist with treatment.Risks and benefits of image guided port-a-catheter placement was discussed with the patient including, but not limited to bleeding, infection, pneumothorax, or fibrin sheath development and need for additional procedures.  All of the patient's questions were answered, patient is agreeable to proceed. Consent signed and in chart.    Thank you for this interesting consult.  I greatly enjoyed meeting [name] and look forward to participating in their care.  A copy of this report was sent to the requesting provider on this date.  Electronically Signed: D. Jeananne Rama, PA-C 05/29/2023, 5:47 PM   I spent a total of 25 minutes    in face to face in clinical consultation, greater than 50% of which was counseling/coordinating care for Port-A-Cath  placement

## 2023-05-30 ENCOUNTER — Ambulatory Visit (HOSPITAL_COMMUNITY)
Admission: RE | Admit: 2023-05-30 | Discharge: 2023-05-30 | Disposition: A | Discharge status: Home / Self Care | Payer: Medicare HMO | Source: Ambulatory Visit | Attending: Hematology | Admitting: Hematology

## 2023-05-30 DIAGNOSIS — E785 Hyperlipidemia, unspecified: Secondary | ICD-10-CM | POA: Insufficient documentation

## 2023-05-30 DIAGNOSIS — Z7984 Long term (current) use of oral hypoglycemic drugs: Secondary | ICD-10-CM | POA: Diagnosis not present

## 2023-05-30 DIAGNOSIS — I10 Essential (primary) hypertension: Secondary | ICD-10-CM | POA: Insufficient documentation

## 2023-05-30 DIAGNOSIS — C675 Malignant neoplasm of bladder neck: Secondary | ICD-10-CM | POA: Diagnosis not present

## 2023-05-30 DIAGNOSIS — Z85828 Personal history of other malignant neoplasm of skin: Secondary | ICD-10-CM | POA: Insufficient documentation

## 2023-05-30 DIAGNOSIS — Z87891 Personal history of nicotine dependence: Secondary | ICD-10-CM | POA: Diagnosis not present

## 2023-05-30 DIAGNOSIS — C679 Malignant neoplasm of bladder, unspecified: Secondary | ICD-10-CM | POA: Diagnosis not present

## 2023-05-30 DIAGNOSIS — I480 Paroxysmal atrial fibrillation: Secondary | ICD-10-CM | POA: Insufficient documentation

## 2023-05-30 DIAGNOSIS — E119 Type 2 diabetes mellitus without complications: Secondary | ICD-10-CM | POA: Insufficient documentation

## 2023-05-30 DIAGNOSIS — Z7985 Long-term (current) use of injectable non-insulin antidiabetic drugs: Secondary | ICD-10-CM | POA: Insufficient documentation

## 2023-05-30 HISTORY — PX: IR IMAGING GUIDED PORT INSERTION: IMG5740

## 2023-05-30 LAB — GLUCOSE, CAPILLARY: Glucose-Capillary: 128 mg/dL — ABNORMAL HIGH (ref 70–99)

## 2023-05-30 MED ORDER — SODIUM CHLORIDE 0.9 % IV SOLN: INTRAVENOUS | Status: DC

## 2023-05-30 MED ORDER — HEPARIN SOD (PORK) LOCK FLUSH 100 UNIT/ML IV SOLN
INTRAVENOUS | Status: AC
  Filled 2023-05-30: qty 5

## 2023-05-30 MED ORDER — FENTANYL CITRATE (PF) 100 MCG/2ML IJ SOLN
INTRAMUSCULAR | Status: AC | PRN
  Administered 2023-05-30: 50 ug via INTRAVENOUS

## 2023-05-30 MED ORDER — FENTANYL CITRATE (PF) 100 MCG/2ML IJ SOLN
INTRAMUSCULAR | Status: AC
  Filled 2023-05-30: qty 2

## 2023-05-30 MED ORDER — LIDOCAINE-EPINEPHRINE 1 %-1:100000 IJ SOLN
INTRAMUSCULAR | Status: AC
  Filled 2023-05-30: qty 1

## 2023-05-30 MED ORDER — MIDAZOLAM HCL 2 MG/2ML IJ SOLN
INTRAMUSCULAR | Status: AC | PRN
  Administered 2023-05-30: 1 mg via INTRAVENOUS

## 2023-05-30 MED ORDER — MIDAZOLAM HCL 2 MG/2ML IJ SOLN
INTRAMUSCULAR | Status: AC
  Filled 2023-05-30: qty 2

## 2023-05-30 MED ORDER — FENTANYL CITRATE (PF) 100 MCG/2ML IJ SOLN
INTRAMUSCULAR | Status: AC | PRN
Start: 2023-05-30 — End: 2023-05-30
  Administered 2023-05-30: 50 ug via INTRAVENOUS

## 2023-05-30 MED ORDER — LIDOCAINE-EPINEPHRINE 1 %-1:100000 IJ SOLN
20.0000 mL | Freq: Once | INTRAMUSCULAR | Status: AC
  Administered 2023-05-30: 20 mL via INTRADERMAL

## 2023-05-30 MED ORDER — HEPARIN SOD (PORK) LOCK FLUSH 100 UNIT/ML IV SOLN
500.0000 [IU] | Freq: Once | INTRAVENOUS | Status: AC
  Administered 2023-05-30: 500 [IU] via INTRAVENOUS

## 2023-05-30 NOTE — Procedures (Signed)
Interventional Radiology Procedure Note ° °Procedure: Single Lumen Power Port Placement   ° °Access:  Right internal jugular vein ° °Findings: Catheter tip positioned at cavoatrial junction. Port is ready for immediate use.  ° °Complications: None ° °EBL: < 10 mL ° °Recommendations:  °- Ok to shower in 24 hours °- Do not submerge for 7 days °- Routine line care  ° ° ° , MD ° ° ° °

## 2023-05-30 NOTE — Discharge Instructions (Signed)
Implanted Port Insertion, Care After  The following information offers guidance on how to care for yourself after your procedure. Your health care provider may also give you more specific instructions. If you have problems or questions, contact your health care provider.  What can I expect after the procedure? After the procedure, it is common to have: Discomfort at the port insertion site. Bruising on the skin over the port. This should improve over 3-4 days.   Urgent needs - Interventional Radiology, clinic [phone] (mon-fri 8-5).   Wound - May remove dressing and shower in 24 to 48 hours.  Keep site clean and dry.  Replace with bandaid as needed.  Do not submerge in tub or water until site healing well. If closed with glue, glue will flake off on its own.   If ordered by your provider, may start Emla cream (or any other creams ointments or lotions) in 2 weeks or after incision is healed. Port is ready for use immediately.   After completion of treatment, your provider should have you set up for monthly port flushes.   Follow these instructions at home: Port care After your port is placed, you will get a manufacturer's information card. The card has information about your port. Keep this card with you at all times. Take care of the port as told by your health care provider. Ask your health care provider if you or a family member can get training for taking care of the port at home. A home health care nurse will be be available to help care for the port. Make sure to remember what type of port you have. Incision care     Follow instructions from your health care provider about how to take care of your port insertion site. Make sure you: Wash your hands with soap and water for at least 20 seconds before and after you change your bandage (dressing). If soap and water are not available, use hand sanitizer. Change your dressing as told by your health care provider. Leave stitches  (sutures), skin glue, or adhesive strips in place. These skin closures may need to stay in place for 2 weeks or longer. If adhesive strip edges start to loosen and curl up, you may trim the loose edges. Do not remove adhesive strips completely unless your health care provider tells you to do that. Check your port insertion site every day for signs of infection. Check for: Redness, swelling, or pain. Fluid or blood. Warmth. Pus or a bad smell. Activity Return to your normal activities as told by your health care provider. Ask your health care provider what activities are safe for you. You may have to avoid lifting. Ask your health care provider how much you can safely lift. General instructions Take over-the-counter and prescription medicines only as told by your health care provider. Do not take baths, swim, or use a hot tub until your health care provider approves. Ask your health care provider if you may take showers. You may only be allowed to take sponge baths. If you were given a sedative during the procedure, it can affect you for several hours. Do not drive or operate machinery until your health care provider says that it is safe. Wear a medical alert bracelet in case of an emergency. This will tell any health care providers that you have a port. Keep all follow-up visits. This is important. Contact a health care provider if: You cannot flush your port with saline as directed, or you cannot   draw blood from the port. You have a fever or chills. You have redness, swelling, or pain around your port insertion site. You have fluid or blood coming from your port insertion site. Your port insertion site feels warm to the touch. You have pus or a bad smell coming from the port insertion site. Get help right away if: You have chest pain or shortness of breath. You have bleeding from your port that you cannot control. These symptoms may be an emergency. Get help right away. Call 911. Do not  wait to see if the symptoms will go away. Do not drive yourself to the hospital. Summary Take care of the port as told by your health care provider. Keep the manufacturer's information card with you at all times. Change your dressing as told by your health care provider. Contact a health care provider if you have a fever or chills or if you have redness, swelling, or pain around your port insertion site. Keep all follow-up visits. This information is not intended to replace advice given to you by your health care provider. Make sure you discuss any questions you have with your health care provider. Document Revised: 04/13/2021 Document Reviewed: 04/13/2021 Elsevier Patient Education  2023 Elsevier Inc.    Moderate Conscious Sedation  Adult  Care After (English)  After the procedure, it is common to have: Sleepiness for a few hours. Impaired judgment for a few hours. Trouble with balance. Nausea or vomiting if you eat too soon. Follow these instructions at home: For the time period you were told by your health care provider:  Rest. Do not participate in activities where you could fall or become injured. Do not drive or use machinery. Do not drink alcohol. Do not take sleeping pills or medicines that cause drowsiness. Do not make important decisions or sign legal documents. Do not take care of children on your own. Eating and drinking Follow instructions from your health care provider about what you may eat and drink. Drink enough fluid to keep your urine pale yellow. If you vomit: Drink clear fluids slowly and in small amounts as you are able. Clear fluids include water, ice chips, low-calorie sports drinks, and fruit juice that has water added to it (diluted fruit juice). Eat light and bland foods in small amounts as you are able. These foods include bananas, applesauce, rice, lean meats, toast, and crackers. General instructions Take over-the-counter and prescription medicines  only as told by your health care provider. Have a responsible adult stay with you for the time you are told. Do not use any products that contain nicotine or tobacco. These products include cigarettes, chewing tobacco, and vaping devices, such as e-cigarettes. If you need help quitting, ask your health care provider. Return to your normal activities as told by your health care provider. Ask your health care provider what activities are safe for you. Your health care provider may give you more instructions. Make sure you know what you can and cannot do. Contact a health care provider if: You are still sleepy or having trouble with balance after 24 hours. You feel light-headed. You vomit every time you eat or drink. You get a rash. You have a fever. You have redness or swelling around the IV site. Get help right away if: You have trouble breathing. You start to feel confused at home. These symptoms may be an emergency. Get help right away. Call 911. Do not wait to see if the symptoms will go away. Do not   drive yourself to the hospital. This information is not intended to replace advice given to you by your health care provider. Make sure you discuss any questions you have with your health care provider. 

## 2023-05-31 DIAGNOSIS — C679 Malignant neoplasm of bladder, unspecified: Secondary | ICD-10-CM | POA: Diagnosis not present

## 2023-06-01 ENCOUNTER — Other Ambulatory Visit (HOSPITAL_COMMUNITY): Payer: Self-pay

## 2023-06-01 ENCOUNTER — Telehealth: Payer: Medicare HMO | Admitting: Hematology

## 2023-06-01 ENCOUNTER — Ambulatory Visit (HOSPITAL_COMMUNITY)
Admission: RE | Admit: 2023-06-01 | Discharge: 2023-06-01 | Disposition: A | Discharge status: Home / Self Care | Payer: Medicare HMO | Source: Ambulatory Visit | Attending: Hematology | Admitting: Hematology

## 2023-06-01 DIAGNOSIS — J439 Emphysema, unspecified: Secondary | ICD-10-CM | POA: Diagnosis not present

## 2023-06-01 DIAGNOSIS — C675 Malignant neoplasm of bladder neck: Secondary | ICD-10-CM | POA: Insufficient documentation

## 2023-06-01 DIAGNOSIS — I700 Atherosclerosis of aorta: Secondary | ICD-10-CM | POA: Diagnosis not present

## 2023-06-01 DIAGNOSIS — N3289 Other specified disorders of bladder: Secondary | ICD-10-CM | POA: Diagnosis not present

## 2023-06-01 DIAGNOSIS — C679 Malignant neoplasm of bladder, unspecified: Secondary | ICD-10-CM | POA: Diagnosis not present

## 2023-06-01 MED ORDER — IOHEXOL 300 MG/ML  SOLN
100.0000 mL | Freq: Once | INTRAMUSCULAR | Status: AC | PRN
  Administered 2023-06-01: 100 mL via INTRAVENOUS

## 2023-06-04 ENCOUNTER — Other Ambulatory Visit: Payer: Self-pay | Admitting: Cardiovascular Disease

## 2023-06-04 DIAGNOSIS — I480 Paroxysmal atrial fibrillation: Secondary | ICD-10-CM

## 2023-06-05 NOTE — Telephone Encounter (Signed)
Prescription refill request for Eliquis received. Indication:afib Last office visit:11/23 Scr:0.8  7/24 Age: 72 Weight:94.2  kg  Prescription refilled

## 2023-06-06 ENCOUNTER — Other Ambulatory Visit (HOSPITAL_COMMUNITY): Payer: Self-pay | Admitting: *Deleted

## 2023-06-06 DIAGNOSIS — C678 Malignant neoplasm of overlapping sites of bladder: Secondary | ICD-10-CM | POA: Diagnosis not present

## 2023-06-06 DIAGNOSIS — C661 Malignant neoplasm of right ureter: Secondary | ICD-10-CM | POA: Diagnosis not present

## 2023-06-08 ENCOUNTER — Encounter (HOSPITAL_COMMUNITY): Payer: Medicare HMO

## 2023-06-08 ENCOUNTER — Encounter (INDEPENDENT_AMBULATORY_CARE_PROVIDER_SITE_OTHER): Payer: Self-pay

## 2023-06-08 NOTE — Progress Notes (Unsigned)
Patient Care Team: Plotnikov, [name], MD as PCP - General (Internal Medicine) Nahser, Deloris Ping, MD as PCP - Cardiology (Cardiology) Verner Chol, [facility] (Inactive) as Pharmacist (Pharmacist) Carlus Pavlov, MD as Consulting Physician (Internal Medicine) Jerilee Field, MD as Consulting Physician (Urology) Iva Boop, MD as Consulting Physician (Gastroenterology) Janet Berlin, MD as Consulting Physician (Ophthalmology) Malachy Mood, MD as Attending Physician (Hematology and Oncology)  Clinic Day:  06/09/2023  Referring physician: Malachy Mood, MD  ASSESSMENT & PLAN:   Assessment & Plan: Bladder cancer [facility]) -He has had 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 Similar irregular asymmetric wall thickening along the posterior urinary bladder with progressive irregular thickening of the right ureter extending to the level of the renal pelvis where there is now a masslike 16 x 12 mm soft tissue focus, suspicious for direct ureteral extension of disease into the renal pelvis. 2. Irregular asymmetric wall thickening along the posterior urinary bladder measuring 14 mm is similar prior. 3. Right double-J ureteral stent is in place with persistent perinephric/periureteric stranding without significant hydronephrosis. 4. No convincing evidence of metastatic disease in the chest, abdomen or pelvis 5. Pancolonic diverticulosis without findings of acute diverticulitis. 6. Aortic Atherosclerosis (ICD10-I70.0) and Emphysema (ICD10-J43.9). -Dr. Mosetta Putt, oncology, has recommended neoadjuvant chemotherapy with  cisplatin and gemcitabine for 4 cycles, followed by total cystectomy and prostatectomy  -05/30/2023 the patient had placement of power port.  -06/09/2023 - patient to begin recommended chemotherapy.  Review labs  -CBC showing WBC 11.3; Hgb15.3; Hct 43.3; Plt 203; ANC 8.2 -CMP showing glucose 186; Ca 8.6; normal LFTs; normal renal functions.  -magnesium 1.7 -He denies chest pain, chest pressure, or shortness of breath. He denies headaches or visual disturbances. He denies abdominal pain, nausea, vomiting, or changes in bowel habits. He does have blood in urine, which is intermittent. His appetite is good and weight is stable.  Proceed with Cycle 1 cisplatin and gemcitabine today    Type 2 diabetes mellitus without complication, without long-term current use of insulin (HCC) 06/09/2023 - blood sugar 186 non-fasting today  He voices understanding that chemotherapy may raise blood sugar. He knows to take all diabetic medications as prescribed. Plans to check blood sugars often. Will notify provider if blood sugars become very high and do not return to baseline in reasonable time frame.   Plan: Reviewed labs  -CBC showing WBC 11.3; Hgb15.3; Hct 43.3; Plt 203; ANC 8.2 -CMP showing glucose 186; Ca 8.6; normal LFTs; normal renal functions.  -magnesium 1.7 -He denies chest pain, chest pressure, or shortness of breath. He denies headaches or visual disturbances. He denies abdominal pain, nausea, vomiting, or changes in bowel habits. He does have blood in urine, which is intermittent. His appetite is good and weight is stable.  Proceed with Cycle 1 cisplatin and gemcitabine today  He will return for labs/flush, follow up, and infusion as scheduled.   The patient understands the plans discussed today and is in agreement with them.  He knows to contact our office if he develops concerns prior to his next appointment.  I provided 30 minutes of face-to-face time during this encounter and > 50% was spent  counseling as documented  under my assessment and plan.    Carlean Jews, NP  [facility] [address] [place] [address] Dept: [phone] Dept Fax: [phone]   No orders of the defined types were placed in this encounter.     CHIEF COMPLAINT:  CC: recurrent bladder cancer   Current Treatment:  cisplatin and gemcitabine Q 21 days for 4 cycles   INTERVAL HISTORY:  Robert [name] is here today for repeat clinical assessment. He denies fevers or chills. He denies pain. His appetite is good. His weight has been stable.  Bladder Cancer  CT of chest, abdomen, and pelvis was done 06/01/2023 with the following findings: -IMPRESSION: 1. Similar irregular asymmetric wall thickening along the posterior urinary bladder with progressive irregular thickening of the right ureter extending to the level of the renal pelvis where there is now a masslike 16 x 12 mm soft tissue focus, suspicious for direct ureteral extension of disease into the renal pelvis. 2. Irregular asymmetric wall thickening along the posterior urinary bladder measuring 14 mm is similar prior. 3. Right double-J ureteral stent is in place with persistent perinephric/periureteric stranding without significant hydronephrosis. 4. No convincing evidence of metastatic disease in the chest, abdomen or pelvis 5. Pancolonic diverticulosis without findings of acute diverticulitis. 6. Aortic Atherosclerosis (ICD10-I70.0) and Emphysema (ICD10-J43.9). -Dr. Mosetta Putt, oncology, has recommended neoadjuvant chemotherapy with cisplatin and gemcitabine for 4 cycles, followed by total cystectomy and prostatectomy  -05/30/2023 the patient had placement of power port.  -06/09/2023 - patient to begin recommended chemotherapy.  Lab review today -CBC showing WBC 11.3; Hgb15.3; Hct 43.3; Plt 203; ANC 8.2 -CMP showing glucose 186; Ca 8.6; normal LFTs; normal renal functions.  -magnesium  1.7 -He denies chest pain, chest pressure, or shortness of breath. He denies headaches or visual disturbances. He denies abdominal pain, nausea, vomiting, or changes in bowel habits. He does have blood in urine, which is intermittent. His appetite is good and weight is stable.  Proceed with Cycle 1 cisplatin and gemcitabine today    I have reviewed the past medical history, past surgical history, social history and family history with the patient and they are unchanged from previous note.  ALLERGIES:  has No Known Allergies.  MEDICATIONS:  Current Outpatient Medications  Medication Sig Dispense Refill   acetaminophen (TYLENOL) 500 MG tablet Take 1,000 mg by mouth every 6 (six) hours as needed.     atorvastatin (LIPITOR) 40 MG tablet TAKE ONE TABLET BY MOUTH ONCE DAILY (Patient taking differently: at bedtime.) 90 tablet 1   B Complex-C (B-COMPLEX WITH VITAMIN C) tablet Take 1 tablet by mouth daily.     Cholecalciferol (VITAMIN D-3 PO) Take by mouth daily.     ELIQUIS 5 MG TABS tablet TAKE ONE TABLET BY MOUTH TWICE DAILY 60 tablet 5   fluticasone (FLONASE) 50 MCG/ACT nasal spray Place 1 spray into both nostrils daily as needed for allergies or rhinitis.     glipiZIDE (GLUCOTROL) 5 MG tablet TAKE 1/2 TABLET BY MOUTH EVERY EVENING 45 tablet 3   glucose blood test strip 1 each by Other route daily. Use daily for glucose control - Dx 250.00 100 each 0   hydrochlorothiazide (MICROZIDE) 12.5 MG capsule Take 1 capsule (12.5 mg total) by mouth daily. 30 capsule 11   lidocaine-prilocaine (EMLA) cream Apply to affected area once 30 g 3   lisinopril (ZESTRIL) 40 MG tablet Take 1 tablet (40 mg total) by mouth daily. 90 tablet  3   metFORMIN (GLUCOPHAGE-XR) 500 MG 24 hr tablet Take 4 tablets (2,000 mg total) by mouth daily with supper. 360 tablet 3   metoprolol succinate (TOPROL-XL) 25 MG 24 hr tablet TAKE ONE TABLET BY MOUTH ONCE DAILY (Patient taking differently: at bedtime.) 90 tablet 3   Multiple  Vitamins-Minerals (EMERGEN-C VITAMIN C) PACK Take 1 packet by mouth daily as needed (immune support).     ondansetron (ZOFRAN) 8 MG tablet Take 1 tablet (8 mg total) by mouth every 8 (eight) hours as needed for nausea or vomiting. Start on the third day after cisplatin. 30 tablet 1   prochlorperazine (COMPAZINE) 10 MG tablet Take 1 tablet (10 mg total) by mouth every 6 (six) hours as needed (Nausea or vomiting). 30 tablet 1   propranolol (INDERAL) 10 MG tablet TAKE 1 TABLET BY MOUTH 4 TIMES DAILY AS NEEDED FOR PALPITATIONS, IRREGULAR OR FAST HEART RATE 270 tablet 1   Semaglutide, 1 MG/DOSE, (OZEMPIC, 1 MG/DOSE,) 4 MG/3ML SOPN INJECT 1 MG ONCE A WEEK AS DIRECTED (Patient taking differently: TUESDAY) 3 mL 2   sildenafil (REVATIO) 20 MG tablet TAKE 1 TO 5 TABLETS 2 TO 3 HOURS BEFORE SEX 60 tablet 5   tamsulosin (FLOMAX) 0.4 MG CAPS capsule Take 0.4 mg by mouth at bedtime.     traMADol (ULTRAM) 50 MG tablet Take 1 tablet (50 mg total) by mouth every 6 (six) hours as needed for severe pain. (Patient not taking: Reported on 04/24/2023) 20 tablet 0   No current facility-administered medications for this visit.   Facility-Administered Medications Ordered in Other Visits  Medication Dose Route Frequency Provider Last Rate Last Admin   0.9 % NaCl with KCl 20 mEq/ L  infusion   Intravenous Once Malachy Mood, MD       CISplatin (PLATINOL) 150 mg in sodium chloride 0.9 % 500 mL chemo infusion  70 mg/m2 (Order-Specific) Intravenous Once Malachy Mood, MD       dexamethasone (DECADRON) 10 mg in sodium chloride 0.9 % 50 mL IVPB  10 mg Intravenous Once Malachy Mood, MD       fosaprepitant (EMEND) 150 mg in sodium chloride 0.9 % 145 mL IVPB  150 mg Intravenous Once Malachy Mood, MD       gemcitabine (GEMZAR) 2,204 mg in sodium chloride 0.9 % 250 mL chemo infusion  1,000 mg/m2 (Order-Specific) Intravenous Once Malachy Mood, MD       heparin lock flush 100 unit/mL  500 Units Intracatheter Once PRN Malachy Mood, MD       magnesium sulfate  IVPB 2 g 50 mL  2 g Intravenous Once Malachy Mood, MD 50 mL/hr at 06/09/23 0956 2 g at 06/09/23 0956   palonosetron (ALOXI) injection 0.25 mg  0.25 mg Intravenous Once Malachy Mood, MD       sodium chloride flush (NS) 0.9 % injection 10 mL  10 mL Intracatheter PRN Malachy Mood, MD        HISTORY OF PRESENT ILLNESS:   Oncology History Overview Note   Cancer Staging  Bladder cancer [facility]) Staging form: Urinary Bladder, AJCC 8th Edition - Clinical stage from 05/14/2021: Stage 0a (cTa, cN0, cM0) - Signed by Malachy Mood, MD on 01/02/2023 Stage prefix: Initial diagnosis WHO/ISUP grade (low/high): High Grade Histologic grading system: 2 grade system - Pathologic stage from 02/22/2022: Stage I (pT1, pN0, cM0) - Signed by Malachy Mood, MD on 01/02/2023 WHO/ISUP grade (low/high): High Grade Histologic grading system: 2 grade system     Bladder cancer (  HCC)  05/14/2021 Cancer Staging   Staging form: Urinary Bladder, AJCC 8th Edition - Clinical stage from 05/14/2021: Stage 0a (cTa, cN0, cM0) - Signed by Malachy Mood, MD on 01/02/2023 Stage prefix: Initial diagnosis WHO/ISUP grade (low/high): High Grade Histologic grading system: 2 grade system   01/31/2022 Initial Diagnosis   Bladder cancer (HCC)   02/22/2022 Cancer Staging   Staging form: Urinary Bladder, AJCC 8th Edition - Pathologic stage from 02/22/2022: Stage I (pT1, pN0, cM0) - Signed by Malachy Mood, MD on 01/02/2023 WHO/ISUP grade (low/high): High Grade Histologic grading system: 2 grade system   01/12/2023 - 04/06/2023 Chemotherapy   Patient is on Treatment Plan : BLADDER Pembrolizumab (200) q21d     05/05/2023 Pathology Results     FINAL MICROSCOPIC DIAGNOSIS:   A. BLADDER, RIGHT POSTERIOR, TURBT:  Noninvasive high grade papillary urothelial carcinoma  Muscularis propria (detrusor muscle) is not present   B. BLADDER NECK AND PROSTATIC URETHRA, BIOPSY:  High grade urothelial carcinoma  The carcinoma focally invades muscularis propria (detrusor muscle)      06/01/2023 Imaging   CT Chest, Abdomen, and Pelvis with contrast  IMPRESSION: 1. Similar irregular asymmetric wall thickening along the posterior urinary bladder with progressive irregular thickening of the right ureter extending to the level of the renal pelvis where there is now a masslike 16 x 12 mm soft tissue focus, suspicious for direct ureteral extension of disease into the renal pelvis. 2. Irregular asymmetric wall thickening along the posterior urinary bladder measuring 14 mm is similar prior. 3. Right double-J ureteral stent is in place with persistent perinephric/periureteric stranding without significant hydronephrosis. 4. No convincing evidence of metastatic disease in the chest, abdomen or pelvis 5. Pancolonic diverticulosis without findings of acute diverticulitis. 6. Aortic Atherosclerosis (ICD10-I70.0) and Emphysema (ICD10-J43.9).   06/09/2023 -  Chemotherapy   Patient is on Treatment Plan : BLADDER Cisplatin D1 + Gemcitabine D1,8 q21d x 4 Cycles         REVIEW OF SYSTEMS:   Constitutional: Denies fevers, chills or abnormal weight loss Eyes: Denies blurriness of vision Ears, nose, mouth, throat, and face: Denies mucositis or sore throat Respiratory: Denies cough, dyspnea or wheezes Cardiovascular: Denies palpitation, chest discomfort or lower extremity swelling Gastrointestinal:  Denies nausea, heartburn or change in bowel habits Skin: Denies abnormal skin rashes Lymphatics: Denies new lymphadenopathy or easy bruising Neurological:Denies numbness, tingling or new weaknesses Behavioral/Psych: Mood is stable, no new changes  Genitourinary: intermittent blood in urine. All other systems were reviewed with the patient and are negative.   VITALS:   Today's Vitals   06/09/23 0847 06/09/23 0848  BP: 125/74   Pulse: 82   Resp: 16   Temp: 98.2 F (36.8 C)   TempSrc: Oral   SpO2: 96%   Weight: 206 lb 4.8 oz (93.6 kg)   PainSc:  0-No pain   Body mass index is  25.79 kg/m.   Wt Readings from Last 3 Encounters:  06/09/23 206 lb 4.8 oz (93.6 kg)  05/05/23 207 lb 9.6 oz (94.2 kg)  04/06/23 207 lb 11.2 oz (94.2 kg)    Body mass index is 25.79 kg/m.  Performance status (ECOG): 1 - Symptomatic but completely ambulatory  PHYSICAL EXAM:   GENERAL:alert, no distress and comfortable SKIN: skin color, texture, turgor are normal, no rashes or significant lesions EYES: normal, Conjunctiva are pink and non-injected, sclera clear OROPHARYNX:no exudate, no erythema and lips, buccal mucosa, and tongue normal  NECK: supple, thyroid normal size, non-tender, without nodularity LYMPH:  no palpable lymphadenopathy in the cervical, axillary or inguinal LUNGS: clear to auscultation and percussion with normal breathing effort HEART: regular rate & rhythm and no murmurs and no lower extremity edema ABDOMEN:abdomen soft, non-tender and normal bowel sounds Musculoskeletal:no cyanosis of digits and no clubbing  NEURO: alert & oriented x 3 with fluent speech, no focal motor/sensory deficits  LABORATORY DATA:  I have reviewed the data as listed    Component Value Date/Time   NA 135 06/09/2023 0825   NA 138 10/21/2019 1138   K 4.0 06/09/2023 0825   CL 106 06/09/2023 0825   CO2 23 06/09/2023 0825   GLUCOSE 186 (H) 06/09/2023 0825   BUN 17 06/09/2023 0825   BUN 14 10/21/2019 1138   CREATININE 0.90 06/09/2023 0825   CALCIUM 8.6 (L) 06/09/2023 0825   PROT 6.8 06/09/2023 0825   ALBUMIN 4.0 06/09/2023 0825   AST 12 (L) 06/09/2023 0825   ALT 10 06/09/2023 0825   ALKPHOS 78 06/09/2023 0825   BILITOT 0.6 06/09/2023 0825   GFRNONAA >60 06/09/2023 0825   GFRAA 101 10/21/2019 1138     Lab Results  Component Value Date   WBC 11.3 (H) 06/09/2023   NEUTROABS 8.2 (H) 06/09/2023   HGB 15.3 06/09/2023   HCT 43.3 06/09/2023   MCV 91.4 06/09/2023   PLT 201 06/09/2023      RADIOGRAPHIC STUDIES: I have personally reviewed the radiological images as listed and  agreed with the findings in the report. CT CHEST ABDOMEN PELVIS W CONTRAST  Result Date: 06/01/2023 CLINICAL DATA:  Bladder cancer, staging.  * Tracking Code: BO * EXAM: CT CHEST, ABDOMEN, AND PELVIS WITH CONTRAST TECHNIQUE: Multidetector CT imaging of the chest, abdomen and pelvis was performed following the standard protocol during bolus administration of intravenous contrast. RADIATION DOSE REDUCTION: This exam was performed according to the departmental dose-optimization program which includes automated exposure control, adjustment of the mA and/or kV according to patient size and/or use of iterative reconstruction technique. CONTRAST:  OMNIPAQUE IOHEXOL 300 MG/ML  SOLN COMPARISON:  Multiple priors including CT January 09, 2023 and MRI June 07, 2021. FINDINGS: CT CHEST FINDINGS Cardiovascular: Right chest wall Port-A-Cath with tip near the superior cavoatrial junction. Aortic atherosclerosis. No central pulmonary embolus on this nondedicated study. Normal size heart. Aortic atherosclerosis. Calcifications of the mitral annulus. Calcifications of the aortic valve. Coronary artery calcifications Mediastinum/Nodes: No suspicious thyroid nodule. No pathologically enlarged mediastinal, hilar or axillary lymph nodes. The esophagus is grossly unremarkable. Lungs/Pleura: No suspicious pulmonary nodules or masses. Mild apical paraseptal emphysema. No pleural effusion. No pneumothorax. Musculoskeletal: No aggressive lytic or blastic lesion of bone. Multilevel degenerative change of the spine. CT ABDOMEN PELVIS FINDINGS Hepatobiliary: No suspicious hepatic lesion. Gallbladder is unremarkable. No biliary ductal dilation. Pancreas: No pancreatic ductal dilation or evidence of acute inflammation. Spleen: No splenomegaly. Adrenals/Urinary Tract: 16 mm right adrenal adenoma stable. Stable left adrenal thickening favor hyperplasia. Right double-J ureteral stent is in place with persistent perinephric/periureteric  stranding without significant hydronephrosis. Progressive irregular thickening of the right ureter extending to the level of the renal pelvis where there is now a masslike 16 x 12 mm soft tissue focus on image 82/2. Technically too small to accurately characterize bilateral renal lesions are stable from prior. Irregular asymmetric wall thickening along the posterior urinary bladder measuring 14 mm on image 122/2 is similar prior. Stomach/Bowel: No radiopaque enteric contrast material was administered. Stomach is unremarkable for degree of distension. No pathologic dilation of small or large  bowel. Normal appendix. Pancolonic diverticulosis without findings of acute diverticulitis Vascular/Lymphatic: Aortic atherosclerosis. Normal caliber abdominal aorta. Smooth IVC contours. No pathologically enlarged abdominal or pelvic lymph nodes. Reproductive: Prostate is unremarkable. Other: No significant abdominopelvic free fluid Musculoskeletal: No aggressive lytic or blastic lesion of bone. Severe right and moderate left hip degenerative change. Increased size of a well-circumscribed cystic lesion measuring 3.1 cm on image 113/3 which appears to arise from the right hip joint space sitting lateral to the acetabulum with a smaller but similar appearing area adjacent to the right hip on image 104/5 likely reflecting a gluteal bursa. Multilevel degenerative changes spine. IMPRESSION: 1. Similar irregular asymmetric wall thickening along the posterior urinary bladder with progressive irregular thickening of the right ureter extending to the level of the renal pelvis where there is now a masslike 16 x 12 mm soft tissue focus, suspicious for direct ureteral extension of disease into the renal pelvis. 2. Irregular asymmetric wall thickening along the posterior urinary bladder measuring 14 mm is similar prior. 3. Right double-J ureteral stent is in place with persistent perinephric/periureteric stranding without significant  hydronephrosis. 4. No convincing evidence of metastatic disease in the chest, abdomen or pelvis 5. Pancolonic diverticulosis without findings of acute diverticulitis. 6. Aortic Atherosclerosis (ICD10-I70.0) and Emphysema (ICD10-J43.9). Electronically Signed   By: Maudry Mayhew M.D.   On: 06/01/2023 16:54   IR IMAGING GUIDED PORT INSERTION  Result Date: 05/30/2023 INDICATION: 72 year old male with history of bladder cancer requiring central venous access for chemotherapy administration. EXAM: IMPLANTED PORT A CATH PLACEMENT WITH ULTRASOUND AND FLUOROSCOPIC GUIDANCE COMPARISON:  None Available. MEDICATIONS: None. ANESTHESIA/SEDATION: Moderate (conscious) sedation was employed during this procedure. A total of Versed 2 mg and Fentanyl 200 mcg was administered intravenously. Moderate Sedation Time: 13 minutes. The patient's level of consciousness and vital signs were monitored continuously by radiology nursing throughout the procedure under my direct supervision. CONTRAST:  None FLUOROSCOPY TIME:  Two mGy COMPLICATIONS: None immediate. PROCEDURE: The procedure, risks, benefits, and alternatives were explained to the patient. Questions regarding the procedure were encouraged and answered. The patient understands and consents to the procedure. The right neck and chest were prepped with chlorhexidine in a sterile fashion, and a sterile drape was applied covering the operative field. Maximum barrier sterile technique with sterile gowns and gloves were used for the procedure. A timeout was performed prior to the initiation of the procedure. Ultrasound was used to examine the jugular vein which was compressible and free of internal echoes. A skin marker was used to demarcate the planned venotomy and port pocket incision sites. Local anesthesia was provided to these sites and the subcutaneous tunnel track with 1% lidocaine with 1:100,000 epinephrine. A small incision was created at the jugular access site and blunt  dissection was performed of the subcutaneous tissues. Under ultrasound guidance, the jugular vein was accessed with a 21 ga micropuncture needle and an 0.018" wire was inserted to the superior vena cava. Real-time ultrasound guidance was utilized for vascular access including the acquisition of a permanent ultrasound image documenting patency of the accessed vessel. A 5 Fr micopuncture set was then used, through which a 0.035" Rosen wire was passed under fluoroscopic guidance into the inferior vena cava. An 8 Fr dilator was then placed over the wire. A subcutaneous port pocket was then created along the upper chest wall utilizing a combination of sharp and blunt dissection. The pocket was irrigated with sterile saline, packed with gauze, and observed for hemorrhage. A single lumen low profile  power injectable port was chosen for placement. The 8 Fr catheter was tunneled from the port pocket site to the venotomy incision. The port was placed in the pocket. The external catheter was trimmed to appropriate length. The dilator was exchanged for an 8 Fr peel-away sheath under fluoroscopic guidance. The catheter was then placed through the sheath and the sheath was removed. Final catheter positioning was confirmed and documented with a fluoroscopic spot radiograph. The port was accessed with a Huber needle, aspirated, and flushed with heparinized saline. The deep dermal layer of the port pocket incision was closed with interrupted 3-0 Vicryl suture. Dermabond was then placed over the port pocket and neck incisions. The patient tolerated the procedure well without immediate post procedural complication. FINDINGS: After catheter placement, the tip lies within the superior cavoatrial junction. The catheter aspirates and flushes normally and is ready for immediate use. IMPRESSION: Successful placement of a power injectable Port-A-Cath via the right internal jugular vein. The catheter is ready for immediate use. Marliss Coots,  MD [facility] Electronically Signed   By: Marliss Coots M.D.   On: 05/30/2023 16:21    Addendum I have seen the patient and reviewed note and labs. I agree with the assessment and and plan and have edited the notes.  Robert [name] is here for first cycle chemotherapy cisplatin and gemcitabine, lab reviewed, adequate for treatment, will proceed.  We again reviewed the potential side effect from chemo and how to manage.  He voiced good understanding.  He knows to call us if he had any significant side effects after treatment.  Malachy Mood MD 06/09/2023

## 2023-06-08 NOTE — Assessment & Plan Note (Addendum)
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 Similar irregular asymmetric wall thickening along the posterior urinary bladder with progressive irregular thickening of the right ureter extending to the level of the renal pelvis where there is now a masslike 16 x 12 mm soft tissue focus, suspicious for direct ureteral extension of disease into the renal pelvis. 2. Irregular asymmetric wall thickening along the posterior urinary bladder measuring 14 mm is similar prior. 3. Right double-J ureteral stent is in place with persistent perinephric/periureteric stranding without significant hydronephrosis. 4. No convincing evidence of metastatic disease in the chest, abdomen or pelvis 5. Pancolonic diverticulosis without findings of acute diverticulitis. 6. Aortic Atherosclerosis (ICD10-I70.0) and Emphysema (ICD10-J43.9). -Dr. Mosetta Putt, oncology, has recommended neoadjuvant chemotherapy with cisplatin and gemcitabine for 4 cycles, followed by total cystectomy and prostatectomy  -05/30/2023 the patient had placement of power port.  -06/09/2023 - patient to begin recommended chemotherapy.  Review labs  -CBC showing WBC 11.3; Hgb15.3; Hct 43.3; Plt 203; ANC 8.2 -CMP showing glucose 186; Ca 8.6; normal LFTs; normal renal functions.  -magnesium 1.7 -He denies chest pain, chest pressure, or shortness of breath. He denies headaches or visual disturbances. He denies abdominal pain, nausea, vomiting, or changes in bowel habits. He does have blood in urine, which is intermittent. His appetite is good and weight is stable.  Proceed  with Cycle 1 cisplatin and gemcitabine today

## 2023-06-09 ENCOUNTER — Other Ambulatory Visit: Payer: Self-pay

## 2023-06-09 ENCOUNTER — Inpatient Hospital Stay: Payer: Medicare HMO | Admitting: Nurse Practitioner

## 2023-06-09 ENCOUNTER — Inpatient Hospital Stay: Payer: Medicare HMO

## 2023-06-09 ENCOUNTER — Inpatient Hospital Stay: Payer: Medicare HMO | Attending: Hematology

## 2023-06-09 VITALS — BP 125/74 | HR 82 | Temp 98.2°F | Resp 16 | Wt 206.3 lb

## 2023-06-09 VITALS — BP 134/72 | HR 78 | Temp 98.7°F | Resp 20

## 2023-06-09 DIAGNOSIS — Z5111 Encounter for antineoplastic chemotherapy: Secondary | ICD-10-CM | POA: Insufficient documentation

## 2023-06-09 DIAGNOSIS — R7989 Other specified abnormal findings of blood chemistry: Secondary | ICD-10-CM | POA: Insufficient documentation

## 2023-06-09 DIAGNOSIS — Z7901 Long term (current) use of anticoagulants: Secondary | ICD-10-CM | POA: Diagnosis not present

## 2023-06-09 DIAGNOSIS — E119 Type 2 diabetes mellitus without complications: Secondary | ICD-10-CM

## 2023-06-09 DIAGNOSIS — K5730 Diverticulosis of large intestine without perforation or abscess without bleeding: Secondary | ICD-10-CM | POA: Diagnosis not present

## 2023-06-09 DIAGNOSIS — Z125 Encounter for screening for malignant neoplasm of prostate: Secondary | ICD-10-CM

## 2023-06-09 DIAGNOSIS — Z79899 Other long term (current) drug therapy: Secondary | ICD-10-CM | POA: Insufficient documentation

## 2023-06-09 DIAGNOSIS — I700 Atherosclerosis of aorta: Secondary | ICD-10-CM | POA: Insufficient documentation

## 2023-06-09 DIAGNOSIS — J439 Emphysema, unspecified: Secondary | ICD-10-CM | POA: Insufficient documentation

## 2023-06-09 DIAGNOSIS — C679 Malignant neoplasm of bladder, unspecified: Secondary | ICD-10-CM | POA: Diagnosis not present

## 2023-06-09 DIAGNOSIS — Z7984 Long term (current) use of oral hypoglycemic drugs: Secondary | ICD-10-CM | POA: Diagnosis not present

## 2023-06-09 DIAGNOSIS — C675 Malignant neoplasm of bladder neck: Secondary | ICD-10-CM

## 2023-06-09 DIAGNOSIS — D3501 Benign neoplasm of right adrenal gland: Secondary | ICD-10-CM | POA: Insufficient documentation

## 2023-06-09 DIAGNOSIS — K5900 Constipation, unspecified: Secondary | ICD-10-CM | POA: Insufficient documentation

## 2023-06-09 DIAGNOSIS — R319 Hematuria, unspecified: Secondary | ICD-10-CM | POA: Diagnosis not present

## 2023-06-09 DIAGNOSIS — Z95828 Presence of other vascular implants and grafts: Secondary | ICD-10-CM | POA: Insufficient documentation

## 2023-06-09 LAB — CBC WITH DIFFERENTIAL (CANCER CENTER ONLY)
Abs Immature Granulocytes: 0.14 10*3/uL — ABNORMAL HIGH (ref 0.00–0.07)
Basophils Absolute: 0.1 10*3/uL (ref 0.0–0.1)
Basophils Relative: 1 %
Eosinophils Absolute: 0.1 10*3/uL (ref 0.0–0.5)
Eosinophils Relative: 0 %
HCT: 43.3 % (ref 39.0–52.0)
Hemoglobin: 15.3 g/dL (ref 13.0–17.0)
Immature Granulocytes: 1 %
Lymphocytes Relative: 17 %
Lymphs Abs: 1.9 10*3/uL (ref 0.7–4.0)
MCH: 32.3 pg (ref 26.0–34.0)
MCHC: 35.3 g/dL (ref 30.0–36.0)
MCV: 91.4 fL (ref 80.0–100.0)
Monocytes Absolute: 0.9 10*3/uL (ref 0.1–1.0)
Monocytes Relative: 8 %
Neutro Abs: 8.2 10*3/uL — ABNORMAL HIGH (ref 1.7–7.7)
Neutrophils Relative %: 73 %
Platelet Count: 201 10*3/uL (ref 150–400)
RBC: 4.74 MIL/uL (ref 4.22–5.81)
RDW: 13.2 % (ref 11.5–15.5)
WBC Count: 11.3 10*3/uL — ABNORMAL HIGH (ref 4.0–10.5)
nRBC: 0 % (ref 0.0–0.2)

## 2023-06-09 LAB — CMP (CANCER CENTER ONLY)
ALT: 10 U/L (ref 0–44)
AST: 12 U/L — ABNORMAL LOW (ref 15–41)
Albumin: 4 g/dL (ref 3.5–5.0)
Alkaline Phosphatase: 78 U/L (ref 38–126)
Anion gap: 6 (ref 5–15)
BUN: 17 mg/dL (ref 8–23)
CO2: 23 mmol/L (ref 22–32)
Calcium: 8.6 mg/dL — ABNORMAL LOW (ref 8.9–10.3)
Chloride: 106 mmol/L (ref 98–111)
Creatinine: 0.9 mg/dL (ref 0.61–1.24)
GFR, Estimated: >60 mL/min (ref >60)
Glucose, Bld: 186 mg/dL — ABNORMAL HIGH (ref 70–99)
Potassium: 4 mmol/L (ref 3.5–5.1)
Sodium: 135 mmol/L (ref 135–145)
Total Bilirubin: 0.6 mg/dL (ref 0.3–1.2)
Total Protein: 6.8 g/dL (ref 6.5–8.1)

## 2023-06-09 LAB — MAGNESIUM: Magnesium: 1.7 mg/dL (ref 1.7–2.4)

## 2023-06-09 MED ORDER — POTASSIUM CHLORIDE IN NACL 20-0.9 MEQ/L-% IV SOLN
Freq: Once | INTRAVENOUS | Status: AC
  Filled 2023-06-09: qty 1000

## 2023-06-09 MED ORDER — MAGNESIUM SULFATE 2 GM/50ML IV SOLN
2.0000 g | Freq: Once | INTRAVENOUS | Status: AC
  Administered 2023-06-09: 2 g via INTRAVENOUS
  Filled 2023-06-09: qty 50

## 2023-06-09 MED ORDER — SODIUM CHLORIDE 0.9 % IV SOLN
150.0000 mg | Freq: Once | INTRAVENOUS | Status: AC
  Administered 2023-06-09: 150 mg via INTRAVENOUS
  Filled 2023-06-09: qty 150

## 2023-06-09 MED ORDER — PALONOSETRON HCL INJECTION 0.25 MG/5ML
0.2500 mg | Freq: Once | INTRAVENOUS | Status: AC
  Administered 2023-06-09: 0.25 mg via INTRAVENOUS
  Filled 2023-06-09: qty 5

## 2023-06-09 MED ORDER — SODIUM CHLORIDE 0.9 % IV SOLN
1000.0000 mg/m2 | Freq: Once | INTRAVENOUS | Status: AC
  Administered 2023-06-09: 2204 mg via INTRAVENOUS
  Filled 2023-06-09: qty 57.97

## 2023-06-09 MED ORDER — HEPARIN SOD (PORK) LOCK FLUSH 100 UNIT/ML IV SOLN
500.0000 [IU] | Freq: Once | INTRAVENOUS | Status: AC | PRN
  Administered 2023-06-09: 500 [IU]

## 2023-06-09 MED ORDER — SODIUM CHLORIDE 0.9 % IV SOLN
70.0000 mg/m2 | Freq: Once | INTRAVENOUS | Status: AC
  Administered 2023-06-09: 150 mg via INTRAVENOUS
  Filled 2023-06-09: qty 150

## 2023-06-09 MED ORDER — SODIUM CHLORIDE 0.9 % IV SOLN
10.0000 mg | Freq: Once | INTRAVENOUS | Status: AC
  Administered 2023-06-09: 10 mg via INTRAVENOUS
  Filled 2023-06-09: qty 10

## 2023-06-09 MED ORDER — SODIUM CHLORIDE 0.9% FLUSH
10.0000 mL | INTRAVENOUS | Status: DC | PRN
  Administered 2023-06-09: 10 mL

## 2023-06-09 MED ORDER — SODIUM CHLORIDE 0.9 % IV SOLN: Freq: Once | INTRAVENOUS | Status: AC

## 2023-06-09 NOTE — Assessment & Plan Note (Signed)
06/09/2023 - blood sugar 186 non-fasting today  He voices understanding that chemotherapy may raise blood sugar. He knows to take all diabetic medications as prescribed. Plans to check blood sugars often. Will notify provider if blood sugars become very high and do not return to baseline in reasonable time frame.

## 2023-06-09 NOTE — Patient Instructions (Signed)
[facility]  Discharge Instructions: Thank you for choosing [facility] to provide your oncology and hematology care.   If you have a lab appointment with the Cancer Center, please go directly to the Cancer Center and check in at the registration area.   Wear comfortable clothing and clothing appropriate for easy access to any Portacath or PICC line.   We strive to give you quality time with your provider. You may need to reschedule your appointment if you arrive late (15 or more minutes).  Arriving late affects you and other patients whose appointments are after yours.  Also, if you miss three or more appointments without notifying the office, you may be dismissed from the clinic at the provider's discretion.      For prescription refill requests, have your pharmacy contact our office and allow 72 hours for refills to be completed.    Today you received the following chemotherapy and/or immunotherapy agents: Gemcitabine, Cisplatin      To help prevent nausea and vomiting after your treatment, we encourage you to take your nausea medication as directed.  BELOW ARE SYMPTOMS THAT SHOULD BE REPORTED IMMEDIATELY: *FEVER GREATER THAN 100.4 F (38 C) OR HIGHER *CHILLS OR SWEATING *NAUSEA AND VOMITING THAT IS NOT CONTROLLED WITH YOUR NAUSEA MEDICATION *UNUSUAL SHORTNESS OF BREATH *UNUSUAL BRUISING OR BLEEDING *URINARY PROBLEMS (pain or burning when urinating, or frequent urination) *BOWEL PROBLEMS (unusual diarrhea, constipation, pain near the anus) TENDERNESS IN MOUTH AND THROAT WITH OR WITHOUT PRESENCE OF ULCERS (sore throat, sores in mouth, or a toothache) UNUSUAL RASH, SWELLING OR PAIN  UNUSUAL VAGINAL DISCHARGE OR ITCHING   Items with * indicate a potential emergency and should be followed up as soon as possible or go to the Emergency Department if any problems should occur.  Please show the CHEMOTHERAPY ALERT CARD or IMMUNOTHERAPY ALERT  CARD at check-in to the Emergency Department and triage nurse.  Should you have questions after your visit or need to cancel or reschedule your appointment, please contact [facility]  Dept: [phone]  and follow the prompts.  Office hours are 8:00 a.m. to 4:30 p.m. Monday - Friday. Please note that voicemails left after 4:00 p.m. may not be returned until the following business day.  We are closed weekends and major holidays. You have access to a nurse at all times for urgent questions. Please call the main number to the clinic Dept: [phone] and follow the prompts.   For any non-urgent questions, you may also contact your provider using MyChart. We now offer e-Visits for anyone 45 and older to request care online for non-urgent symptoms. For details visit [email or web address].   Also download the MyChart app! Go to the app store, search "MyChart", open the app, select [facility], and log in with your MyChart username and password.  Gemcitabine Injection What is this medication? GEMCITABINE (jem SYE ta been) treats some types of cancer. It works by slowing down the growth of cancer cells. This medicine may be used for other purposes; ask your health care provider or pharmacist if you have questions. COMMON BRAND NAME(S): Gemzar, Infugem What should I tell my care team before I take this medication? They need to know if you have any of these conditions: Blood disorders Infection Kidney disease Liver disease Lung or breathing disease, such as asthma or COPD Recent or ongoing radiation therapy An unusual or allergic reaction to gemcitabine, other medications, foods, dyes,  or preservatives If you or your partner are pregnant or trying to get pregnant Breast-feeding How should I use this medication? This medication is injected into a vein. It is given by your care team in a hospital or clinic setting. Talk to your care team about the use of this  medication in children. Special care may be needed. Overdosage: If you think you have taken too much of this medicine contact a poison control center or emergency room at once. NOTE: This medicine is only for you. Do not share this medicine with others. What if I miss a dose? Keep appointments for follow-up doses. It is important not to miss your dose. Call your care team if you are unable to keep an appointment. What may interact with this medication? Interactions have not been studied. This list may not describe all possible interactions. Give your health care provider a list of all the medicines, herbs, non-prescription drugs, or dietary supplements you use. Also tell them if you smoke, drink alcohol, or use illegal drugs. Some items may interact with your medicine. What should I watch for while using this medication? Your condition will be monitored carefully while you are receiving this medication. This medication may make you feel generally unwell. This is not uncommon, as chemotherapy can affect healthy cells as well as cancer cells. Report any side effects. Continue your course of treatment even though you feel ill unless your care team tells you to stop. In some cases, you may be given additional medications to help with side effects. Follow all directions for their use. This medication may increase your risk of getting an infection. Call your care team for advice if you get a fever, chills, sore throat, or other symptoms of a cold or flu. Do not treat yourself. Try to avoid being around people who are sick. This medication may increase your risk to bruise or bleed. Call your care team if you notice any unusual bleeding. Be careful brushing or flossing your teeth or using a toothpick because you may get an infection or bleed more easily. If you have any dental work done, tell your dentist you are receiving this medication. Avoid taking medications that contain aspirin, acetaminophen,  ibuprofen, naproxen, or ketoprofen unless instructed by your care team. These medications may hide a fever. Talk to your care team if you or your partner wish to become pregnant or think you might be pregnant. This medication can cause serious birth defects if taken during pregnancy and for 6 months after the last dose. A negative pregnancy test is required before starting this medication. A reliable form of contraception is recommended while taking this medication and for 6 months after the last dose. Talk to your care team about effective forms of contraception. Do not father a child while taking this medication and for 3 months after the last dose. Use a condom while having sex during this time period. Do not breastfeed while taking this medication and for at least 1 week after the last dose. This medication may cause infertility. Talk to your care team if you are concerned about your fertility. What side effects may I notice from receiving this medication? Side effects that you should report to your care team as soon as possible: Allergic reactions--skin rash, itching, hives, swelling of the face, lips, tongue, or throat Capillary leak syndrome--stomach or muscle pain, unusual weakness or fatigue, feeling faint or lightheaded, decrease in the amount of urine, swelling of the ankles, hands, or feet, trouble breathing  Infection--fever, chills, cough, sore throat, wounds that don't heal, pain or trouble when passing urine, general feeling of discomfort or being unwell Liver injury--right upper belly pain, loss of appetite, nausea, light-colored stool, dark yellow or brown urine, yellowing skin or eyes, unusual weakness or fatigue Low red blood cell level--unusual weakness or fatigue, dizziness, headache, trouble breathing Lung injury--shortness of breath or trouble breathing, cough, spitting up blood, chest pain, fever Stomach pain, bloody diarrhea, pale skin, unusual weakness or fatigue, decrease in  the amount of urine, which may be signs of hemolytic uremic syndrome Sudden and severe headache, confusion, change in vision, seizures, which may be signs of posterior reversible encephalopathy syndrome (PRES) Unusual bruising or bleeding Side effects that usually do not require medical attention (report to your care team if they continue or are bothersome): Diarrhea Drowsiness Hair loss Nausea Pain, redness, or swelling with sores inside the mouth or throat Vomiting This list may not describe all possible side effects. Call your doctor for medical advice about side effects. You may report side effects to FDA at 1-800-FDA-1088. Where should I keep my medication? This medication is given in a hospital or clinic. It will not be stored at home. NOTE: This sheet is a summary. It may not cover all possible information. If you have questions about this medicine, talk to your doctor, pharmacist, or health care provider.  2024 Elsevier/Gold Standard (2022-02-15 00:00:00)  Cisplatin Injection What is this medication? CISPLATIN (SIS pla tin) treats some types of cancer. It works by slowing down the growth of cancer cells. This medicine may be used for other purposes; ask your health care provider or pharmacist if you have questions. COMMON BRAND NAME(S): Platinol, Platinol -AQ What should I tell my care team before I take this medication? They need to know if you have any of these conditions: Eye disease, vision problems Hearing problems Kidney disease Low blood counts, such as low white cells, platelets, or red blood cells Tingling of the fingers or toes, or other nerve disorder An unusual or allergic reaction to cisplatin, carboplatin, oxaliplatin, other medications, foods, dyes, or preservatives If you or your partner are pregnant or trying to get pregnant Breast-feeding How should I use this medication? This medication is injected into a vein. It is given by your care team in a hospital or  clinic setting. Talk to your care team about the use of this medication in children. Special care may be needed. Overdosage: If you think you have taken too much of this medicine contact a poison control center or emergency room at once. NOTE: This medicine is only for you. Do not share this medicine with others. What if I miss a dose? Keep appointments for follow-up doses. It is important not to miss your dose. Call your care team if you are unable to keep an appointment. What may interact with this medication? Do not take this medication with any of the following: Live virus vaccines This medication may also interact with the following: Certain antibiotics, such as amikacin, gentamicin, neomycin, polymyxin B, streptomycin, tobramycin, vancomycin Foscarnet This list may not describe all possible interactions. Give your health care provider a list of all the medicines, herbs, non-prescription drugs, or dietary supplements you use. Also tell them if you smoke, drink alcohol, or use illegal drugs. Some items may interact with your medicine. What should I watch for while using this medication? Your condition will be monitored carefully while you are receiving this medication. You may need blood work done while  taking this medication. This medication may make you feel generally unwell. This is not uncommon, as chemotherapy can affect healthy cells as well as cancer cells. Report any side effects. Continue your course of treatment even though you feel ill unless your care team tells you to stop. This medication may increase your risk of getting an infection. Call your care team for advice if you get a fever, chills, sore throat, or other symptoms of a cold or flu. Do not treat yourself. Try to avoid being around people who are sick. Avoid taking medications that contain aspirin, acetaminophen, ibuprofen, naproxen, or ketoprofen unless instructed by your care team. These medications may hide a  fever. This medication may increase your risk to bruise or bleed. Call your care team if you notice any unusual bleeding. Be careful brushing or flossing your teeth or using a toothpick because you may get an infection or bleed more easily. If you have any dental work done, tell your dentist you are receiving this medication. Drink fluids as directed while you are taking this medication. This will help protect your kidneys. Call your care team if you get diarrhea. Do not treat yourself. Talk to your care team if you or your partner wish to become pregnant or think you might be pregnant. This medication can cause serious birth defects if taken during pregnancy and for 14 months after the last dose. A negative pregnancy test is required before starting this medication. A reliable form of contraception is recommended while taking this medication and for 14 months after the last dose. Talk to your care team about effective forms of contraception. Do not father a child while taking this medication and for 11 months after the last dose. Use a condom during sex during this time period. Do not breast-feed while taking this medication. This medication may cause infertility. Talk to your care team if you are concerned about your fertility. What side effects may I notice from receiving this medication? Side effects that you should report to your care team as soon as possible: Allergic reactions--skin rash, itching, hives, swelling of the face, lips, tongue, or throat Eye pain, change in vision, vision loss Hearing loss, ringing in ears Infection--fever, chills, cough, sore throat, wounds that don't heal, pain or trouble when passing urine, general feeling of discomfort or being unwell Kidney injury--decrease in the amount of urine, swelling of the ankles, hands, or feet Low red blood cell level--unusual weakness or fatigue, dizziness, headache, trouble breathing Painful swelling, warmth, or redness of the skin,  blisters or sores at the infusion site Pain, tingling, or numbness in the hands or feet Unusual bruising or bleeding Side effects that usually do not require medical attention (report to your care team if they continue or are bothersome): Hair loss Nausea Vomiting This list may not describe all possible side effects. Call your doctor for medical advice about side effects. You may report side effects to FDA at 1-800-FDA-1088. Where should I keep my medication? This medication is given in a hospital or clinic. It will not be stored at home. NOTE: This sheet is a summary. It may not cover all possible information. If you have questions about this medicine, talk to your doctor, pharmacist, or health care provider.  2024 Elsevier/Gold Standard (2022-02-11 00:00:00)

## 2023-06-09 NOTE — Progress Notes (Signed)
Cisplatin diluted in NS554mL, Exp:  02/21/2024.  Robert [name] [place], [place], BCPS, BCOP 06/09/2023 1:11 PM

## 2023-06-10 LAB — PROSTATE-SPECIFIC AG, SERUM (LABCORP): Prostate Specific Ag, Serum: 6.2 ng/mL — ABNORMAL HIGH (ref 0.0–4.0)

## 2023-06-12 ENCOUNTER — Encounter: Payer: Self-pay | Admitting: Hematology

## 2023-06-12 ENCOUNTER — Encounter: Payer: Self-pay | Admitting: Internal Medicine

## 2023-06-12 ENCOUNTER — Ambulatory Visit: Payer: Medicare HMO | Admitting: Internal Medicine

## 2023-06-12 VITALS — BP 100/60 | HR 90 | Ht 75.0 in | Wt 205.0 lb

## 2023-06-12 DIAGNOSIS — Z7985 Long-term (current) use of injectable non-insulin antidiabetic drugs: Secondary | ICD-10-CM | POA: Diagnosis not present

## 2023-06-12 DIAGNOSIS — Z7984 Long term (current) use of oral hypoglycemic drugs: Secondary | ICD-10-CM | POA: Diagnosis not present

## 2023-06-12 DIAGNOSIS — E663 Overweight: Secondary | ICD-10-CM | POA: Diagnosis not present

## 2023-06-12 DIAGNOSIS — E785 Hyperlipidemia, unspecified: Secondary | ICD-10-CM | POA: Diagnosis not present

## 2023-06-12 DIAGNOSIS — E119 Type 2 diabetes mellitus without complications: Secondary | ICD-10-CM

## 2023-06-12 LAB — POCT GLYCOSYLATED HEMOGLOBIN (HGB A1C): Hemoglobin A1C: 6.5 % — AB (ref 4.0–5.6)

## 2023-06-12 NOTE — Telephone Encounter (Signed)
Called pt to see how he did with his recent treatment.  He reports some fatigue yesterday & slept & touch of diarrhea & took imodium this am.  Diarrhea is manageable.  He is eating & drinking fluids OK.  He knows his next appts & how to reach [place] if needed.

## 2023-06-12 NOTE — Progress Notes (Signed)
Patient ID: ELEASAR KIEL, male   DOB: November 30, 1950, 72 y.o.   MRN: [id number]   HPI: ROWAN BOUWKAMP II is a 72 y.o.-year-old male, returning for follow-up for  DM2, dx in 2000s, insulin-independent since 2018, uncontrolled, with complications (PN, ED).  Last visit 6 months ago.  Interim history: Last year, he was diagnosed with bladder cancer and had transurethral resection on 06/18/2021.  The cancer was high-grade, but noninvasive. He was on BCG bladder instillations - off and on Eliquis.  He had a cancer recurrence before our visit in 11/2021 >> no need for ChTx  Currently on Keytruda.  He had TURP 12/02/2022. Now undergoing ChTx - just started. May need to have surgery. He was of Ozempic and Metformin around his imaging scans. No increased urination, blurry vision, nausea, chest pain.  He has hip pain (OA) in the hip - he prefers to stand, walks with a cane.  He needs a hip replacement.  Reviewed HbA1c levels: Lab Results  Component Value Date   HGBA1C 6.5 (A) 06/12/2023   HGBA1C 6.7 (A) 02/03/2023   HGBA1C 6.2 (H) 11/25/2022   HGBA1C 6.5 (A) 08/01/2022   HGBA1C 6.7 (A) 03/28/2022   HGBA1C 7.6 (A) 12/06/2021   HGBA1C 6.7 (A) 08/03/2021   HGBA1C 6.5 (A) 01/28/2021   HGBA1C 6.4 (A) 07/30/2020   HGBA1C 6.4 (A) 03/26/2020   Pt is on a regimen of: - Trulicity 1.5 >> 3 mg weekly >> Ozempic 1 mg weekly - Metformin Er 1000 mg 2x a day >> 2000 mg with dinner >>  did not start >> diarrhea - Glipizide 2.5 mg before a large dinner >> Glipizide ER 2.5 mg before breakfast and a crushed tablet before a large meal >> Glipizide 5 mg 2x a day We stopped Novolog 70/30 10 units 2x a day in 11/2017, when starting Trulicity.  Pt checks his sugars 1-2 times a day: - am:   106-147, 155, 167 >> 119-130s, 150, 160 >> 111-163, 190 >> 125-167, 180 - 2h after b'fast:119-147, 157 > 126-155, 182 >> 136-171 >> n/c - before lunch: 115, 133 >> 135-142 >> 140-173, 239 >> n/c - 2h after lunch: 144-212, 237  >> 124-168 >> 160-179, 282 >> 172 >> 135 - before dinner: 152 >> 120-143 >> 128, 132-159, 207 >> 147 >> 107-171 - 2h after dinner:  138-201 >> 121-162, 188 >> n/c >> 110-123 >> 138, 146, 202 - bedtime:   113-179 >> 121-138 >> 99-150s >> 103-135, 159 >> 88-123 - nighttime: 129 >> 91 (wine) >> 118 >> n/c Lowest sugar was 28 right after starting insulin >> .[name] >> 119 >> 103 >> 88; he has hypoglycemia awareness in the 70s. Highest sugar was 237 >> 139 >> 280s - sandwich + icecream >> 193 >> 202.  Glucometer: ReliOn  Pt's meals are: - Breakfast: whole wheat toast + butte - Lunch: tuna salad or tuna sandwich ([employer] delivery), soft taco, meat + baked beans or potatoes - Dinner: [employer] delivery - Snacks: 1-0: nuts, bananas, other fruit He decreased his alcohol intake. He was swimming 5 times a week before the coronavirus pandemic, now exercising at home.  -No CKD; last BUN/creatinine:  Lab Results  Component Value Date   BUN 17 06/09/2023   BUN 22 05/05/2023   CREATININE 0.90 06/09/2023   CREATININE 0.80 05/05/2023  On lisinopril 40.  -+ HL; last set of lipids: Lab Results  Component Value Date   CHOL 116 02/27/2023   HDL  38.60 (L) 02/27/2023   LDLCALC 47 02/27/2023   LDLDIRECT 77.0 07/07/2015   TRIG 150.0 (H) 02/27/2023   CHOLHDL 3 02/27/2023  On Lipitor 40 and co-Q10.  - last eye exam was in 2024: No DR, + cataract.   - + numbness but no tingling in his feet.  Last foot exam 03/28/2022.  He has a h/o arrhythmia (A fib) - wore an event monitor >> now on beta blocker. Cardiologist: Dr. Elease Hashimoto.  ROS: + See HPI  I reviewed pt's medications, allergies, PMH, social hx, family hx, and changes were documented in the history of present illness. Otherwise, unchanged from my initial visit note.  Past Medical History:  Diagnosis Date   Anticoagulant long-term use    eliquis--- managed by cardiology   Arthritis    Bladder cancer [facility])    urologist--- dr Mena Goes;   overlapping   History of basal cell carcinoma (BCC) excision    History of COVID-19 05/2019   per pt mild symptoms that resolved   HLD (hyperlipidemia)    HTN (hypertension)    Hx of colonic polyps    Insulin dependent type 2 diabetes mellitus [facility])    endocrinologist--- dr Elvera Lennox --  (03-11-2022 per pt check blood sugar 1-2 times daily,  fasting sugar-- 118-120s)   Nocturia    Paroxysmal atrial fibrillation (HCC) 03/12/2019   cardiologist--- dr Melburn Popper   Ureterocele    Use of cane as ambulatory aid    Past Surgical History:  Procedure Laterality Date   BLEPHAROPLASTY Bilateral    per pt approx  2001;   upper eyelid's   COLONOSCOPY     last one approx 2023   CYSTOSCOPY WITH URETEROSCOPY AND STENT PLACEMENT Right 12/02/2022   Procedure: CYSTOSCOPY WITH RIGHT URETEROSCOPY, RIGHT DIAGNOSTIC URETEROSCOPY;  Surgeon: Jerilee Field, MD;  Location: [facility];  Service: Urology;  Laterality: Right;  90 MINS FOR CASE   CYSTOSCOPY/RETROGRADE/URETEROSCOPY Bilateral 05/05/2023   Procedure: CYSTOSCOPY BILATERAL RETROGRADE PYELOGRAM RIGHT URETEROSCOPY RIGHT STENT PLACEMENT;  Surgeon: Jerilee Field, MD;  Location: [facility];  Service: Urology;  Laterality: Bilateral;  90 MINS FOR CASE   IR IMAGING GUIDED PORT INSERTION  05/30/2023   PATELLAR TENDON REPAIR Left 04/16/2004   @[facility]    TRANSURETHRAL RESECTION OF BLADDER TUMOR N/A 06/18/2021   Procedure: TRANSURETHRAL RESECTION OF BLADDER TUMOR (TURBT) WITH RIGHT URETERAL STENT PLACEMENT, RIGHT URETEROSCOPY WITH DISTRACTION OF TUMOR, FULGURATION;  Surgeon: Jerilee Field, MD;  Location: [facility];  Service: Urology;  Laterality: N/A;   TRANSURETHRAL RESECTION OF BLADDER TUMOR N/A 03/15/2022   Procedure: TRANSURETHRAL RESECTION OF BLADDER TUMOR (TURBT) WITH BILATERAL URETEROSCOPY/URETHRAL/ DILATION/BILATERAL RETROGRADE PYELOGRAM/BIOPSY AND FULGURATION OF RIGHT URETERAL CANCER/RIGHT STENT PLACEMENT;  Surgeon: Jerilee Field, MD;  Location: [facility]  Service: Urology;  Laterality: N/A;   TRANSURETHRAL RESECTION OF BLADDER TUMOR N/A 05/05/2023   Procedure: TRANSURETHRAL RESECTION OF BLADDER TUMOR (TURBT) BLADDER BIOSPY;  Surgeon: Jerilee Field, MD;  Location: [facility];  Service: Urology;  Laterality: N/A;   TRANSURETHRAL RESECTION OF BLADDER TUMOR WITH MITOMYCIN-C N/A 12/02/2022   Procedure: TRANSURETHRAL RESECTION OF BLADDER TUMOR;  Surgeon: Jerilee Field, MD;  Location: [facility];  Service: Urology;  Laterality: N/A;   TRANSURETHRAL RESECTION OF PROSTATE  05/05/2023   Procedure: TRANSURETHRAL RESECTION OF THE PROSTATE (TURP);  Surgeon: Jerilee Field, MD;  Location: [facility];  Service: Urology;;   Social History   Socioeconomic History   Marital status: Married    Spouse name:  Not on file   Number of children: 2  Social Needs  Occupational History   Occupation: DVD's   Occupation: Occupational hygienist: Self Employed, retired  Tobacco Use   Smoking status: Former Smoker    Types: Cigars    Last attempt to quit: 02/22/2011   Smokeless tobacco: Never Used   Tobacco comment: 2 cigars monthly or less  Substance and Sexual Activity   Alcohol use: Yes     0.0 oz    Vodka, 2-3 drinks 3x a week   Drugs: No   Current Outpatient Medications on File Prior to Visit  Medication Sig Dispense Refill   acetaminophen (TYLENOL) 500 MG tablet Take 1,000 mg by mouth every 6 (six) hours as needed.     atorvastatin (LIPITOR) 40 MG tablet TAKE ONE TABLET BY MOUTH ONCE DAILY (Patient taking differently: at bedtime.) 90 tablet 1   B Complex-C (B-COMPLEX WITH VITAMIN C) tablet Take 1 tablet by mouth daily.     Cholecalciferol (VITAMIN D-3 PO) Take by mouth daily.     ELIQUIS 5 MG TABS tablet TAKE ONE TABLET BY MOUTH TWICE DAILY 60 tablet 5   fluticasone (FLONASE) 50 MCG/ACT nasal spray Place 1 spray into both nostrils daily as needed for allergies or  rhinitis.     glipiZIDE (GLUCOTROL) 5 MG tablet TAKE 1/2 TABLET BY MOUTH EVERY EVENING 45 tablet 3   glucose blood test strip 1 each by Other route daily. Use daily for glucose control - Dx 250.00 100 each 0   hydrochlorothiazide (MICROZIDE) 12.5 MG capsule Take 1 capsule (12.5 mg total) by mouth daily. 30 capsule 11   lidocaine-prilocaine (EMLA) cream Apply to affected area once 30 g 3   lisinopril (ZESTRIL) 40 MG tablet Take 1 tablet (40 mg total) by mouth daily. 90 tablet 3   metFORMIN (GLUCOPHAGE-XR) 500 MG 24 hr tablet Take 4 tablets (2,000 mg total) by mouth daily with supper. 360 tablet 3   metoprolol succinate (TOPROL-XL) 25 MG 24 hr tablet TAKE ONE TABLET BY MOUTH ONCE DAILY (Patient taking differently: at bedtime.) 90 tablet 3   Multiple Vitamins-Minerals (EMERGEN-C VITAMIN C) PACK Take 1 packet by mouth daily as needed (immune support).     ondansetron (ZOFRAN) 8 MG tablet Take 1 tablet (8 mg total) by mouth every 8 (eight) hours as needed for nausea or vomiting. Start on the third day after cisplatin. 30 tablet 1   prochlorperazine (COMPAZINE) 10 MG tablet Take 1 tablet (10 mg total) by mouth every 6 (six) hours as needed (Nausea or vomiting). 30 tablet 1   propranolol (INDERAL) 10 MG tablet TAKE 1 TABLET BY MOUTH 4 TIMES DAILY AS NEEDED FOR PALPITATIONS, IRREGULAR OR FAST HEART RATE 270 tablet 1   Semaglutide, 1 MG/DOSE, (OZEMPIC, 1 MG/DOSE,) 4 MG/3ML SOPN INJECT 1 MG ONCE A WEEK AS DIRECTED (Patient taking differently: TUESDAY) 3 mL 2   sildenafil (REVATIO) 20 MG tablet TAKE 1 TO 5 TABLETS 2 TO 3 HOURS BEFORE SEX 60 tablet 5   tamsulosin (FLOMAX) 0.4 MG CAPS capsule Take 0.4 mg by mouth at bedtime.     traMADol (ULTRAM) 50 MG tablet Take 1 tablet (50 mg total) by mouth every 6 (six) hours as needed for severe pain. 20 tablet 0   No current facility-administered medications on file prior to visit.   No Known Allergies Family History  Problem Relation Age of Onset   Pancreatic  cancer Mother 41   Cancer Paternal Uncle  unknown type cancer   Aortic aneurysm Other        family history   Cancer Other        Breast (1st degree relative)   Colon cancer Neg Hx    Rectal cancer Neg Hx    Stomach cancer Neg Hx    Esophageal cancer Neg Hx    PE: BP 100/60   Pulse 90   Ht 6\' 3"  (1.905 m)   Wt 205 lb (93 kg)   SpO2 98%   BMI 25.62 kg/m  Wt Readings from Last 3 Encounters:  06/12/23 205 lb (93 kg)  06/09/23 206 lb 4.8 oz (93.6 kg)  05/05/23 207 lb 9.6 oz (94.2 kg)   Constitutional: overweight, in NAD Eyes:  EOMI, no exophthalmos ENT: no neck masses, no cervical lymphadenopathy Cardiovascular: RRR, No MRG Respiratory: CTA B Musculoskeletal: no deformities Skin:no rashes Neurological: no tremor with outstretched hands Diabetic Foot Exam - Simple   Simple Foot Form Diabetic Foot exam was performed with the following findings: Yes 06/12/2023  2:27 PM  Visual Inspection No deformities, no ulcerations, no other skin breakdown bilaterally: Yes Sensation Testing Intact to touch and monofilament testing bilaterally: Yes Pulse Check Posterior Tibialis and Dorsalis pulse intact bilaterally: Yes Comments    ASSESSMENT: 1. DM2, insulin-independent, uncontrolled, with complications - PN - ED  2. HL  3.  Overweight  PLAN:  1. Patient with longstanding, reasonably controlled type 2 diabetes, on oral medication with metformin, sulfonylurea, and also weekly GLP-1 receptor agonist. -At last visit, HbA1c was slightly higher, at 6.7%.  Since then, he had bladder surgery and started chemotherapy.  His sugars are at or slightly higher than target but he does complain about diarrhea with metformin, as he forgot to switch back to taking metformin into doses daily.  Will switch from 2000 mg with dinner to 1000 mg twice a day.  He is taking a higher dose of glipizide than recommended at last visit, but in the setting of higher blood sugars, we can continue this  regimen as he does not have any low blood sugars.  Will continue the same dose of Ozempic. - I suggested to:  Patient Instructions  Please change: - Metformin ER 1000 mg twice a day with meals  Continue: - Glipizide 5 mg before breakfast and dinner - Ozempic 1 mg weekly  Please return in 4 months with your sugar log.   - we checked his HbA1c: 6.7% (highest) - advised to check sugars at different times of the day - 1x a day, rotating check times - advised for yearly eye exams >> he is UTD - return to clinic in 4 months  2.  HL -The latest lipid panel from 02/2023: LDL at goal, HDL slightly low Lab Results  Component Value Date   CHOL 116 02/27/2023   HDL 38.60 (L) 02/27/2023   LDLCALC 47 02/27/2023   LDLDIRECT 77.0 07/07/2015   TRIG 150.0 (H) 02/27/2023   CHOLHDL 3 02/27/2023  -He is on Lipitor 40 mg daily and co-Q10-no side effects  3.  Overweight -He lost approximately 26 pounds immediately after starting GLP-1 receptor agonist and after switching to a more plant-based diet -He gained 8 pounds before last visit but lost 10 pounds since then  Carlus Pavlov, MD PhD [facility] Endocrinology

## 2023-06-12 NOTE — Patient Instructions (Addendum)
Please change: - Metformin ER 1000 mg 2x a day with meals  Continue: - Glipizide 5 mg before breakfast and dinner - Ozempic 1 mg weekly  Please return in 4 months with your sugar log.

## 2023-06-12 NOTE — Progress Notes (Signed)
PSA up some from prior check. Started treatment 06/09/2023

## 2023-06-15 ENCOUNTER — Inpatient Hospital Stay: Payer: Medicare HMO | Admitting: Hematology

## 2023-06-15 ENCOUNTER — Inpatient Hospital Stay: Payer: Medicare HMO

## 2023-06-15 ENCOUNTER — Encounter: Payer: Self-pay | Admitting: Hematology

## 2023-06-15 ENCOUNTER — Inpatient Hospital Stay (HOSPITAL_BASED_OUTPATIENT_CLINIC_OR_DEPARTMENT_OTHER): Payer: Medicare HMO

## 2023-06-15 VITALS — BP 106/62 | HR 102 | Temp 97.6°F | Resp 18 | Ht 75.0 in | Wt 200.6 lb

## 2023-06-15 DIAGNOSIS — Z95828 Presence of other vascular implants and grafts: Secondary | ICD-10-CM

## 2023-06-15 DIAGNOSIS — C675 Malignant neoplasm of bladder neck: Secondary | ICD-10-CM

## 2023-06-15 DIAGNOSIS — C679 Malignant neoplasm of bladder, unspecified: Secondary | ICD-10-CM

## 2023-06-15 DIAGNOSIS — K5730 Diverticulosis of large intestine without perforation or abscess without bleeding: Secondary | ICD-10-CM | POA: Diagnosis not present

## 2023-06-15 DIAGNOSIS — K5900 Constipation, unspecified: Secondary | ICD-10-CM | POA: Diagnosis not present

## 2023-06-15 DIAGNOSIS — I700 Atherosclerosis of aorta: Secondary | ICD-10-CM | POA: Diagnosis not present

## 2023-06-15 DIAGNOSIS — Z5111 Encounter for antineoplastic chemotherapy: Secondary | ICD-10-CM | POA: Diagnosis not present

## 2023-06-15 DIAGNOSIS — R7989 Other specified abnormal findings of blood chemistry: Secondary | ICD-10-CM | POA: Diagnosis not present

## 2023-06-15 DIAGNOSIS — E119 Type 2 diabetes mellitus without complications: Secondary | ICD-10-CM | POA: Diagnosis not present

## 2023-06-15 DIAGNOSIS — R319 Hematuria, unspecified: Secondary | ICD-10-CM | POA: Diagnosis not present

## 2023-06-15 DIAGNOSIS — J439 Emphysema, unspecified: Secondary | ICD-10-CM | POA: Diagnosis not present

## 2023-06-15 LAB — CBC WITH DIFFERENTIAL (CANCER CENTER ONLY)
Abs Immature Granulocytes: 0.02 10*3/uL (ref 0.00–0.07)
Basophils Absolute: 0 10*3/uL (ref 0.0–0.1)
Basophils Relative: 1 %
Eosinophils Absolute: 0 10*3/uL (ref 0.0–0.5)
Eosinophils Relative: 0 %
HCT: 40.9 % (ref 39.0–52.0)
Hemoglobin: 14.8 g/dL (ref 13.0–17.0)
Immature Granulocytes: 0 %
Lymphocytes Relative: 26 %
Lymphs Abs: 1.9 10*3/uL (ref 0.7–4.0)
MCH: 31.8 pg (ref 26.0–34.0)
MCHC: 36.2 g/dL — ABNORMAL HIGH (ref 30.0–36.0)
MCV: 88 fL (ref 80.0–100.0)
Monocytes Absolute: 0.2 10*3/uL (ref 0.1–1.0)
Monocytes Relative: 2 %
Neutro Abs: 5 10*3/uL (ref 1.7–7.7)
Neutrophils Relative %: 71 %
Platelet Count: 151 10*3/uL (ref 150–400)
RBC: 4.65 MIL/uL (ref 4.22–5.81)
RDW: 12.4 % (ref 11.5–15.5)
WBC Count: 7.1 10*3/uL (ref 4.0–10.5)
nRBC: 0 % (ref 0.0–0.2)

## 2023-06-15 LAB — CMP (CANCER CENTER ONLY)
ALT: 21 U/L (ref 0–44)
AST: 14 U/L — ABNORMAL LOW (ref 15–41)
Albumin: 3.9 g/dL (ref 3.5–5.0)
Alkaline Phosphatase: 72 U/L (ref 38–126)
Anion gap: 9 (ref 5–15)
BUN: 29 mg/dL — ABNORMAL HIGH (ref 8–23)
CO2: 26 mmol/L (ref 22–32)
Calcium: 9.5 mg/dL (ref 8.9–10.3)
Chloride: 97 mmol/L — ABNORMAL LOW (ref 98–111)
Creatinine: 1.43 mg/dL — ABNORMAL HIGH (ref 0.61–1.24)
GFR, Estimated: 52 mL/min — ABNORMAL LOW (ref >60)
Glucose, Bld: 220 mg/dL — ABNORMAL HIGH (ref 70–99)
Potassium: 3.9 mmol/L (ref 3.5–5.1)
Sodium: 132 mmol/L — ABNORMAL LOW (ref 135–145)
Total Bilirubin: 0.7 mg/dL (ref 0.3–1.2)
Total Protein: 6.7 g/dL (ref 6.5–8.1)

## 2023-06-15 MED ORDER — SODIUM CHLORIDE 0.9 % IV SOLN: Freq: Once | INTRAVENOUS | Status: AC

## 2023-06-15 MED ORDER — PROCHLORPERAZINE MALEATE 10 MG PO TABS
10.0000 mg | ORAL_TABLET | Freq: Once | ORAL | Status: AC
  Administered 2023-06-15: 10 mg via ORAL
  Filled 2023-06-15: qty 1

## 2023-06-15 MED ORDER — SODIUM CHLORIDE 0.9% FLUSH
10.0000 mL | INTRAVENOUS | Status: DC | PRN
  Administered 2023-06-15: 10 mL

## 2023-06-15 MED ORDER — HEPARIN SOD (PORK) LOCK FLUSH 100 UNIT/ML IV SOLN
500.0000 [IU] | Freq: Once | INTRAVENOUS | Status: AC | PRN
  Administered 2023-06-15: 500 [IU]

## 2023-06-15 MED ORDER — SODIUM CHLORIDE 0.9 % IV SOLN
1000.0000 mg/m2 | Freq: Once | INTRAVENOUS | Status: AC
  Administered 2023-06-15: 2204 mg via INTRAVENOUS
  Filled 2023-06-15: qty 57.97

## 2023-06-15 NOTE — Progress Notes (Signed)
[facility]   Telephone:(336) [phone] Fax:(336) [phone]   Clinic Follow up Note   Patient Care Team: Plotnikov, [name], MD as PCP - General (Internal Medicine) Nahser, Deloris Ping, MD as PCP - Cardiology (Cardiology) Verner Chol, [facility] (Inactive) as Pharmacist (Pharmacist) Carlus Pavlov, MD as Consulting Physician (Internal Medicine) Jerilee Field, MD as Consulting Physician (Urology) Iva Boop, MD as Consulting Physician (Gastroenterology) Janet Berlin, MD as Consulting Physician (Ophthalmology) Malachy Mood, MD as Attending Physician (Hematology and Oncology)  Date of Service:  06/15/2023  CHIEF COMPLAINT: f/u of recurrent bladder cancer   CURRENT THERAPY:   cisplatin and gemcitabine Q 21 days for 4 cycles   ASSESSMENT:  Robert [name] is a 72 y.o. 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 -He is clinically doing well, asymptomatic.  Surgical resection was discussed with patient, but he would like to try nonsurgical options. -He started [facility] on 01/12/23, stopped in June 2024 due to disease progression. 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 -I recommended neoadjuvant chemotherapy with cisplatin and gemcitabine for 4 cycles, followed by total cystectomy and prostatectomy. -He started chemotherapy on June 08, 2023.  He tolerated first dose overall well.  He did have some issues with diarrhea and constipation, and has lost 3 to 4 pounds.  We again discussed management of side effect. -Lab reviewed, creatinine 1.43 today, increased from last week, likely related to the diarrhea.  Will give him 1 L normal saline today. -Will proceed cycle 1 day 8 gemcitabine today   PLAN: -lab reviewed -CMP- creatinine increased 1.43 -proceed with Gemzar today -will add IV hydration today with 1L NS - lab/flush and treatment 06/29/2023  SUMMARY OF ONCOLOGIC HISTORY: Oncology History Overview Note   Cancer Staging  Bladder cancer [facility]) Staging form: Urinary Bladder, AJCC 8th Edition - Clinical stage from 05/14/2021: Stage 0a (cTa, cN0, cM0) - Signed by Malachy Mood, MD on 01/02/2023 Stage prefix: Initial diagnosis WHO/ISUP grade (low/high): High Grade Histologic grading system: 2 grade system - Pathologic stage from 02/22/2022: Stage I (pT1, pN0, cM0) - Signed by Malachy Mood, MD on 01/02/2023 WHO/ISUP grade (low/high): High Grade Histologic grading system: 2 grade system     Bladder cancer (HCC)  05/14/2021 Cancer Staging   Staging form: Urinary Bladder, AJCC 8th Edition - Clinical stage from 05/14/2021: Stage 0a (cTa, cN0, cM0) - Signed by Malachy Mood, MD on 01/02/2023 Stage prefix: Initial diagnosis WHO/ISUP grade (low/high): High Grade Histologic grading system: 2 grade system   01/31/2022 Initial Diagnosis   Bladder cancer (HCC)   02/22/2022 Cancer Staging   Staging form: Urinary Bladder, AJCC 8th Edition - Pathologic stage from 02/22/2022: Stage I (pT1, pN0, cM0) - Signed by Malachy Mood, MD on 01/02/2023 WHO/ISUP grade (low/high): High Grade Histologic grading system: 2 grade system   01/12/2023 - 04/06/2023 Chemotherapy   Patient is on Treatment Plan : BLADDER Pembrolizumab (200) q21d  05/05/2023 Pathology Results     FINAL MICROSCOPIC DIAGNOSIS:   A. BLADDER, RIGHT POSTERIOR, TURBT:  Noninvasive high grade  papillary urothelial carcinoma  Muscularis propria (detrusor muscle) is not present   B. BLADDER NECK AND PROSTATIC URETHRA, BIOPSY:  High grade urothelial carcinoma  The carcinoma focally invades muscularis propria (detrusor muscle)     05/05/2023 Cancer Staging   Staging form: Urinary Bladder, AJCC 8th Edition - Clinical stage from 05/05/2023: Stage [name] (cT2, cN0, cM0) - Signed by Malachy Mood, MD on 06/15/2023 WHO/ISUP grade (low/high): High Grade Histologic grading system: 2 grade system   06/01/2023 Imaging   CT Chest, Abdomen, and Pelvis with contrast  IMPRESSION: 1. Similar irregular asymmetric wall thickening along the posterior urinary bladder with progressive irregular thickening of the right ureter extending to the level of the renal pelvis where there is now a masslike 16 x 12 mm soft tissue focus, suspicious for direct ureteral extension of disease into the renal pelvis. 2. Irregular asymmetric wall thickening along the posterior urinary bladder measuring 14 mm is similar prior. 3. Right double-J ureteral stent is in place with persistent perinephric/periureteric stranding without significant hydronephrosis. 4. No convincing evidence of metastatic disease in the chest, abdomen or pelvis 5. Pancolonic diverticulosis without findings of acute diverticulitis. 6. Aortic Atherosclerosis (ICD10-I70.0) and Emphysema (ICD10-J43.9).   06/09/2023 -  Chemotherapy   Patient is on Treatment Plan : BLADDER Cisplatin D1 + Gemcitabine D1,8 q21d x 4 Cycles        INTERVAL HISTORY:  Robert [name] is here for a follow up of recurrent bladder cancer . He was last seen by NP Heather on 06/09/2023 He presents to the clinic alone. Pt state that first cycle of treatment he had diarrhea and some fatigue. He states that he gets at nigh about 4 times. He takes Imodium for the diarrhea. He also state he is on Metformin  which cause diarrhea as well.   All other systems were reviewed with the patient  and are negative.  MEDICAL HISTORY:  Past Medical History:  Diagnosis Date   Anticoagulant long-term use    eliquis--- managed by cardiology   Arthritis    Bladder cancer [facility])    urologist--- dr Mena Goes;  overlapping   History of basal cell carcinoma (BCC) excision    History of COVID-19 05/2019   per pt mild symptoms that resolved   HLD (hyperlipidemia)    HTN (hypertension)    Hx of colonic polyps    Insulin dependent type 2 diabetes mellitus [facility])    endocrinologist--- dr Elvera Lennox --  (03-11-2022 per pt check blood sugar 1-2 times daily,  fasting sugar-- 118-120s)   Nocturia    Paroxysmal atrial fibrillation (HCC) 03/12/2019   cardiologist--- dr Melburn Popper   Ureterocele    Use of cane as ambulatory aid     SURGICAL HISTORY: Past Surgical History:  Procedure Laterality Date   BLEPHAROPLASTY Bilateral    per pt approx  2001;   upper eyelid's   COLONOSCOPY     last one approx 2023   CYSTOSCOPY WITH URETEROSCOPY AND STENT PLACEMENT Right 12/02/2022   Procedure: CYSTOSCOPY WITH RIGHT URETEROSCOPY, RIGHT DIAGNOSTIC URETEROSCOPY;  Surgeon: Jerilee Field, MD;  Location: [facility];  Service: Urology;  Laterality: Right;  90 MINS FOR CASE   CYSTOSCOPY/RETROGRADE/URETEROSCOPY Bilateral 05/05/2023   Procedure: CYSTOSCOPY BILATERAL RETROGRADE PYELOGRAM RIGHT URETEROSCOPY RIGHT STENT PLACEMENT;  Surgeon: Jerilee Field, MD;  Location: [facility];  Service: Urology;  Laterality: Bilateral;  90  MINS FOR CASE   IR IMAGING GUIDED PORT INSERTION  05/30/2023   PATELLAR TENDON REPAIR Left 04/16/2004   @[facility]    TRANSURETHRAL RESECTION OF BLADDER TUMOR N/A 06/18/2021   Procedure: TRANSURETHRAL RESECTION OF BLADDER TUMOR (TURBT) WITH RIGHT URETERAL STENT PLACEMENT, RIGHT URETEROSCOPY WITH DISTRACTION OF TUMOR, FULGURATION;  Surgeon: Jerilee Field, MD;  Location: [facility];  Service: Urology;  Laterality: N/A;   TRANSURETHRAL RESECTION OF BLADDER TUMOR N/A  03/15/2022   Procedure: TRANSURETHRAL RESECTION OF BLADDER TUMOR (TURBT) WITH BILATERAL URETEROSCOPY/URETHRAL/ DILATION/BILATERAL RETROGRADE PYELOGRAM/BIOPSY AND FULGURATION OF RIGHT URETERAL CANCER/RIGHT STENT PLACEMENT;  Surgeon: Jerilee Field, MD;  Location: [facility];  Service: Urology;  Laterality: N/A;   TRANSURETHRAL RESECTION OF BLADDER TUMOR N/A 05/05/2023   Procedure: TRANSURETHRAL RESECTION OF BLADDER TUMOR (TURBT) BLADDER BIOSPY;  Surgeon: Jerilee Field, MD;  Location: [facility];  Service: Urology;  Laterality: N/A;   TRANSURETHRAL RESECTION OF BLADDER TUMOR WITH MITOMYCIN-C N/A 12/02/2022   Procedure: TRANSURETHRAL RESECTION OF BLADDER TUMOR;  Surgeon: Jerilee Field, MD;  Location: [facility];  Service: Urology;  Laterality: N/A;   TRANSURETHRAL RESECTION OF PROSTATE  05/05/2023   Procedure: TRANSURETHRAL RESECTION OF THE PROSTATE (TURP);  Surgeon: Jerilee Field, MD;  Location: [facility];  Service: Urology;;    I have reviewed the social history and family history with the patient and they are unchanged from previous note.  ALLERGIES:  has No Known Allergies.  MEDICATIONS:  Current Outpatient Medications  Medication Sig Dispense Refill   acetaminophen (TYLENOL) 500 MG tablet Take 1,000 mg by mouth every 6 (six) hours as needed.     atorvastatin (LIPITOR) 40 MG tablet TAKE ONE TABLET BY MOUTH ONCE DAILY (Patient taking differently: at bedtime.) 90 tablet 1   B Complex-C (B-COMPLEX WITH VITAMIN C) tablet Take 1 tablet by mouth daily.     Cholecalciferol (VITAMIN D-3 PO) Take by mouth daily.     ELIQUIS 5 MG TABS tablet TAKE ONE TABLET BY MOUTH TWICE DAILY 60 tablet 5   fluticasone (FLONASE) 50 MCG/ACT nasal spray Place 1 spray into both nostrils daily as needed for allergies or rhinitis.     glipiZIDE (GLUCOTROL) 5 MG tablet TAKE 1/2 TABLET BY MOUTH EVERY EVENING 45 tablet 3   glucose blood test strip 1 each by Other  route daily. Use daily for glucose control - Dx 250.00 100 each 0   hydrochlorothiazide (MICROZIDE) 12.5 MG capsule Take 1 capsule (12.5 mg total) by mouth daily. 30 capsule 11   lidocaine-prilocaine (EMLA) cream Apply to affected area once 30 g 3   lisinopril (ZESTRIL) 40 MG tablet Take 1 tablet (40 mg total) by mouth daily. 90 tablet 3   metFORMIN (GLUCOPHAGE-XR) 500 MG 24 hr tablet Take 4 tablets (2,000 mg total) by mouth daily with supper. 360 tablet 3   metoprolol succinate (TOPROL-XL) 25 MG 24 hr tablet TAKE ONE TABLET BY MOUTH ONCE DAILY (Patient taking differently: at bedtime.) 90 tablet 3   Multiple Vitamins-Minerals (EMERGEN-C VITAMIN C) PACK Take 1 packet by mouth daily as needed (immune support).     ondansetron (ZOFRAN) 8 MG tablet Take 1 tablet (8 mg total) by mouth every 8 (eight) hours as needed for nausea or vomiting. Start on the third day after cisplatin. 30 tablet 1   prochlorperazine (COMPAZINE) 10 MG tablet Take 1 tablet (10 mg total) by mouth every 6 (six) hours as needed (Nausea or vomiting). 30 tablet 1   propranolol (INDERAL) 10 MG  tablet TAKE 1 TABLET BY MOUTH 4 TIMES DAILY AS NEEDED FOR PALPITATIONS, IRREGULAR OR FAST HEART RATE 270 tablet 1   Semaglutide, 1 MG/DOSE, (OZEMPIC, 1 MG/DOSE,) 4 MG/3ML SOPN INJECT 1 MG ONCE A WEEK AS DIRECTED (Patient taking differently: TUESDAY) 3 mL 2   sildenafil (REVATIO) 20 MG tablet TAKE 1 TO 5 TABLETS 2 TO 3 HOURS BEFORE SEX 60 tablet 5   tamsulosin (FLOMAX) 0.4 MG CAPS capsule Take 0.4 mg by mouth at bedtime.     traMADol (ULTRAM) 50 MG tablet Take 1 tablet (50 mg total) by mouth every 6 (six) hours as needed for severe pain. 20 tablet 0   No current facility-administered medications for this visit.   Facility-Administered Medications Ordered in Other Visits  Medication Dose Route Frequency Provider Last Rate Last Admin   sodium chloride flush (NS) 0.9 % injection 10 mL  10 mL Intracatheter PRN Malachy Mood, MD   10 mL at 06/15/23 1515     PHYSICAL EXAMINATION: ECOG PERFORMANCE STATUS: 1 - Symptomatic but completely ambulatory  Vitals:   06/15/23 1322  BP: 106/62  Pulse: (!) 102  Resp: 18  Temp: 97.6 F (36.4 C)  SpO2: 99%   Wt Readings from Last 3 Encounters:  06/15/23 200 lb 9.6 oz (91 kg)  06/12/23 205 lb (93 kg)  06/09/23 206 lb 4.8 oz (93.6 kg)     GENERAL:alert, no distress and comfortable SKIN: skin color normal, no rashes or significant lesions EYES: normal, Conjunctiva are pink and non-injected, sclera clear  NEURO: alert & oriented x 3 with fluent speech  LABORATORY DATA:  I have reviewed the data as listed    Latest Ref Rng & Units 06/15/2023   12:46 PM 06/09/2023    8:25 AM 05/05/2023    6:10 AM  CBC  WBC 4.0 - 10.5 K/uL 7.1  11.3    Hemoglobin 13.0 - 17.0 g/dL 82.9  56.2  13.0   Hematocrit 39.0 - 52.0 % 40.9  43.3  45.0   Platelets 150 - 400 K/uL 151  201          Latest Ref Rng & Units 06/15/2023   12:46 PM 06/09/2023    8:25 AM 05/05/2023    6:10 AM  CMP  Glucose 70 - 99 mg/dL 865  784  696   BUN 8 - 23 mg/dL 29  17  22    Creatinine 0.61 - 1.24 mg/dL 2.95  2.84  1.32   Sodium 135 - 145 mmol/L 132  135  141   Potassium 3.5 - 5.1 mmol/L 3.9  4.0  4.3   Chloride 98 - 111 mmol/L 97  106  107   CO2 22 - 32 mmol/L 26  23    Calcium 8.9 - 10.3 mg/dL 9.5  8.6    Total Protein 6.5 - 8.1 g/dL 6.7  6.8    Total Bilirubin 0.3 - 1.2 mg/dL 0.7  0.6    Alkaline Phos 38 - 126 U/L 72  78    AST 15 - 41 U/L 14  12    ALT 0 - 44 U/L 21  10        RADIOGRAPHIC STUDIES: I have personally reviewed the radiological images as listed and agreed with the findings in the report. No results found.    Orders Placed This Encounter  Procedures   CBC with Differential (Cancer Center Only)    Standing Status:   Future    Standing Expiration Date:   07/20/2024  CMP (Cancer Center only)    Standing Status:   Future    Standing Expiration Date:   07/20/2024   Magnesium    Standing Status:   Future     Standing Expiration Date:   07/20/2024   CBC with Differential (Cancer Center Only)    Standing Status:   Future    Standing Expiration Date:   07/27/2024   CMP (Cancer Center only)    Standing Status:   Future    Standing Expiration Date:   07/27/2024   CBC with Differential (Cancer Center Only)    Standing Status:   Future    Standing Expiration Date:   08/10/2024   CMP (Cancer Center only)    Standing Status:   Future    Standing Expiration Date:   08/10/2024   Magnesium    Standing Status:   Future    Standing Expiration Date:   08/10/2024   CBC with Differential (Cancer Center Only)    Standing Status:   Future    Standing Expiration Date:   08/17/2024   CMP (Cancer Center only)    Standing Status:   Future    Standing Expiration Date:   08/17/2024   All questions were answered. The patient knows to call the clinic with any problems, questions or concerns. No barriers to learning was detected. The total time spent in the appointment was 30 minutes.     Malachy Mood, MD 06/15/2023   Carolin Coy, CMA, am acting as scribe for Malachy Mood, MD.   I have reviewed the above documentation for accuracy and completeness, and I agree with the above.

## 2023-06-15 NOTE — Assessment & Plan Note (Addendum)
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 -He is clinically doing well, asymptomatic.  Surgical resection was discussed with patient, but he would like to try nonsurgical options. -He started [facility] on 01/12/23, stopped in June 2024 due to disease progression. 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 -I recommended neoadjuvant chemotherapy with cisplatin and gemcitabine for 4 cycles, followed by total cystectomy and prostatectomy. -He started chemotherapy on June 08, 2023.  He tolerated first dose overall well.

## 2023-06-15 NOTE — Progress Notes (Signed)
Patient seen by Dr. America Brown are within treatment parameters.  Labs reviewed:  CMP  -creatinine 1.43   Per physician team, patient is ready for treatment and there are NO modifications to the treatment plan.

## 2023-06-15 NOTE — Patient Instructions (Signed)
[facility]  Discharge Instructions: Thank you for choosing [facility] to provide your oncology and hematology care.   If you have a lab appointment with the [facility], please go directly to the [facility] and check in at the registration area.   Wear comfortable clothing and clothing appropriate for easy access to any Portacath or PICC line.   We strive to give you quality time with your provider. You may need to reschedule your appointment if you arrive late (15 or more minutes).  Arriving late affects you and other patients whose appointments are after yours.  Also, if you miss three or more appointments without notifying the office, you may be dismissed from the clinic at the provider's discretion.      For prescription refill requests, have your pharmacy contact our office and allow 72 hours for refills to be completed.    Today you received the following chemotherapy and/or immunotherapy agents: Gemzar      To help prevent nausea and vomiting after your treatment, we encourage you to take your nausea medication as directed.  BELOW ARE SYMPTOMS THAT SHOULD BE REPORTED IMMEDIATELY: *FEVER GREATER THAN 100.4 F (38 C) OR HIGHER *CHILLS OR SWEATING *NAUSEA AND VOMITING THAT IS NOT CONTROLLED WITH YOUR NAUSEA MEDICATION *UNUSUAL SHORTNESS OF BREATH *UNUSUAL BRUISING OR BLEEDING *URINARY PROBLEMS (pain or burning when urinating, or frequent urination) *BOWEL PROBLEMS (unusual diarrhea, constipation, pain near the anus) TENDERNESS IN MOUTH AND THROAT WITH OR WITHOUT PRESENCE OF ULCERS (sore throat, sores in mouth, or a toothache) UNUSUAL RASH, SWELLING OR PAIN  UNUSUAL VAGINAL DISCHARGE OR ITCHING   Items with * indicate a potential emergency and should be followed up as soon as possible or go to the Emergency Department if any problems should occur.  Please show the CHEMOTHERAPY ALERT CARD or IMMUNOTHERAPY ALERT CARD at check-in  to the Emergency Department and triage nurse.  Should you have questions after your visit or need to cancel or reschedule your appointment, please contact [facility]  Dept: [phone]  and follow the prompts.  Office hours are 8:00 a.m. to 4:30 p.m. Monday - Friday. Please note that voicemails left after 4:00 p.m. may not be returned until the following business day.  We are closed weekends and major holidays. You have access to a nurse at all times for urgent questions. Please call the main number to the clinic Dept: [phone] and follow the prompts.   For any non-urgent questions, you may also contact your provider using MyChart. We now offer e-Visits for anyone 40 and older to request care online for non-urgent symptoms. For details visit [email or web address].   Also download the MyChart app! Go to the app store, search "MyChart", open the app, select [facility], and log in with your MyChart username and password.

## 2023-06-19 DIAGNOSIS — C678 Malignant neoplasm of overlapping sites of bladder: Secondary | ICD-10-CM | POA: Diagnosis not present

## 2023-06-19 DIAGNOSIS — C661 Malignant neoplasm of right ureter: Secondary | ICD-10-CM | POA: Diagnosis not present

## 2023-06-19 DIAGNOSIS — R8279 Other abnormal findings on microbiological examination of urine: Secondary | ICD-10-CM | POA: Diagnosis not present

## 2023-06-21 ENCOUNTER — Ambulatory Visit (HOSPITAL_COMMUNITY): Admit: 2023-06-21 | Payer: Medicare HMO | Admitting: Orthopedic Surgery

## 2023-06-21 SURGERY — ARTHROPLASTY, HIP, TOTAL, ANTERIOR APPROACH
Anesthesia: Spinal | Site: Hip | Laterality: Right

## 2023-06-22 DIAGNOSIS — R972 Elevated prostate specific antigen [PSA]: Secondary | ICD-10-CM | POA: Diagnosis not present

## 2023-06-22 DIAGNOSIS — I10 Essential (primary) hypertension: Secondary | ICD-10-CM | POA: Diagnosis not present

## 2023-06-22 DIAGNOSIS — F17210 Nicotine dependence, cigarettes, uncomplicated: Secondary | ICD-10-CM | POA: Diagnosis not present

## 2023-06-22 DIAGNOSIS — E876 Hypokalemia: Secondary | ICD-10-CM | POA: Diagnosis not present

## 2023-06-22 DIAGNOSIS — E7800 Pure hypercholesterolemia, unspecified: Secondary | ICD-10-CM | POA: Diagnosis not present

## 2023-06-22 DIAGNOSIS — C651 Malignant neoplasm of right renal pelvis: Secondary | ICD-10-CM | POA: Diagnosis not present

## 2023-06-22 DIAGNOSIS — F17290 Nicotine dependence, other tobacco product, uncomplicated: Secondary | ICD-10-CM | POA: Diagnosis not present

## 2023-06-22 DIAGNOSIS — C679 Malignant neoplasm of bladder, unspecified: Secondary | ICD-10-CM | POA: Diagnosis not present

## 2023-06-22 DIAGNOSIS — C675 Malignant neoplasm of bladder neck: Secondary | ICD-10-CM | POA: Diagnosis not present

## 2023-06-22 DIAGNOSIS — D696 Thrombocytopenia, unspecified: Secondary | ICD-10-CM | POA: Diagnosis not present

## 2023-06-22 DIAGNOSIS — I4891 Unspecified atrial fibrillation: Secondary | ICD-10-CM | POA: Diagnosis not present

## 2023-06-28 MED FILL — Fosaprepitant Dimeglumine For IV Infusion 150 MG (Base Eq): INTRAVENOUS | Qty: 5 | Status: AC

## 2023-06-28 MED FILL — Dexamethasone Sodium Phosphate Inj 100 MG/10ML: INTRAMUSCULAR | Qty: 1 | Status: AC

## 2023-06-29 ENCOUNTER — Inpatient Hospital Stay: Payer: Medicare HMO

## 2023-06-29 ENCOUNTER — Inpatient Hospital Stay: Payer: Medicare HMO | Attending: Hematology | Admitting: Hematology

## 2023-06-29 ENCOUNTER — Encounter: Payer: Self-pay | Admitting: Hematology

## 2023-06-29 ENCOUNTER — Inpatient Hospital Stay: Payer: Medicare HMO | Attending: Hematology

## 2023-06-29 VITALS — BP 125/48 | HR 79 | Temp 98.0°F | Resp 15 | Ht 75.0 in | Wt 203.8 lb

## 2023-06-29 DIAGNOSIS — Z79899 Other long term (current) drug therapy: Secondary | ICD-10-CM | POA: Diagnosis not present

## 2023-06-29 DIAGNOSIS — Z7984 Long term (current) use of oral hypoglycemic drugs: Secondary | ICD-10-CM | POA: Insufficient documentation

## 2023-06-29 DIAGNOSIS — Z95828 Presence of other vascular implants and grafts: Secondary | ICD-10-CM

## 2023-06-29 DIAGNOSIS — E119 Type 2 diabetes mellitus without complications: Secondary | ICD-10-CM | POA: Diagnosis not present

## 2023-06-29 DIAGNOSIS — C675 Malignant neoplasm of bladder neck: Secondary | ICD-10-CM

## 2023-06-29 DIAGNOSIS — C679 Malignant neoplasm of bladder, unspecified: Secondary | ICD-10-CM | POA: Diagnosis not present

## 2023-06-29 DIAGNOSIS — Z5111 Encounter for antineoplastic chemotherapy: Secondary | ICD-10-CM | POA: Insufficient documentation

## 2023-06-29 DIAGNOSIS — Z7985 Long-term (current) use of injectable non-insulin antidiabetic drugs: Secondary | ICD-10-CM | POA: Diagnosis not present

## 2023-06-29 LAB — CBC WITH DIFFERENTIAL (CANCER CENTER ONLY)
Abs Immature Granulocytes: 0.35 10*3/uL — ABNORMAL HIGH (ref 0.00–0.07)
Basophils Absolute: 0 10*3/uL (ref 0.0–0.1)
Basophils Relative: 1 %
Eosinophils Absolute: 0 10*3/uL (ref 0.0–0.5)
Eosinophils Relative: 0 %
HCT: 34.9 % — ABNORMAL LOW (ref 39.0–52.0)
Hemoglobin: 12.2 g/dL — ABNORMAL LOW (ref 13.0–17.0)
Immature Granulocytes: 7 %
Lymphocytes Relative: 35 %
Lymphs Abs: 1.9 10*3/uL (ref 0.7–4.0)
MCH: 31.5 pg (ref 26.0–34.0)
MCHC: 35 g/dL (ref 30.0–36.0)
MCV: 90.2 fL (ref 80.0–100.0)
Monocytes Absolute: 0.9 10*3/uL (ref 0.1–1.0)
Monocytes Relative: 16 %
Neutro Abs: 2.2 10*3/uL (ref 1.7–7.7)
Neutrophils Relative %: 41 %
Platelet Count: 458 10*3/uL — ABNORMAL HIGH (ref 150–400)
RBC: 3.87 MIL/uL — ABNORMAL LOW (ref 4.22–5.81)
RDW: 12.8 % (ref 11.5–15.5)
WBC Count: 5.4 10*3/uL (ref 4.0–10.5)
nRBC: 0 % (ref 0.0–0.2)

## 2023-06-29 LAB — CMP (CANCER CENTER ONLY)
ALT: 11 U/L (ref 0–44)
AST: 11 U/L — ABNORMAL LOW (ref 15–41)
Albumin: 3.6 g/dL (ref 3.5–5.0)
Alkaline Phosphatase: 75 U/L (ref 38–126)
Anion gap: 7 (ref 5–15)
BUN: 18 mg/dL (ref 8–23)
CO2: 25 mmol/L (ref 22–32)
Calcium: 9 mg/dL (ref 8.9–10.3)
Chloride: 101 mmol/L (ref 98–111)
Creatinine: 0.84 mg/dL (ref 0.61–1.24)
GFR, Estimated: >60 mL/min (ref >60)
Glucose, Bld: 208 mg/dL — ABNORMAL HIGH (ref 70–99)
Potassium: 4.4 mmol/L (ref 3.5–5.1)
Sodium: 133 mmol/L — ABNORMAL LOW (ref 135–145)
Total Bilirubin: 0.2 mg/dL — ABNORMAL LOW (ref 0.3–1.2)
Total Protein: 6.3 g/dL — ABNORMAL LOW (ref 6.5–8.1)

## 2023-06-29 LAB — MAGNESIUM: Magnesium: 1.6 mg/dL — ABNORMAL LOW (ref 1.7–2.4)

## 2023-06-29 MED ORDER — SODIUM CHLORIDE 0.9% FLUSH
10.0000 mL | INTRAVENOUS | Status: DC | PRN
  Administered 2023-06-29: 10 mL

## 2023-06-29 MED ORDER — OXYBUTYNIN CHLORIDE ER 10 MG PO TB24: 10.0000 mg | ORAL_TABLET | Freq: Every day | ORAL | 0 refills | Status: DC

## 2023-06-29 MED ORDER — SODIUM CHLORIDE 0.9 % IV SOLN
70.0000 mg/m2 | Freq: Once | INTRAVENOUS | Status: AC
  Administered 2023-06-29: 150 mg via INTRAVENOUS
  Filled 2023-06-29: qty 150

## 2023-06-29 MED ORDER — DEXAMETHASONE SODIUM PHOSPHATE 10 MG/ML IJ SOLN
5.0000 mg | Freq: Once | INTRAMUSCULAR | Status: AC
  Administered 2023-06-29: 5 mg via INTRAVENOUS
  Filled 2023-06-29: qty 1

## 2023-06-29 MED ORDER — HEPARIN SOD (PORK) LOCK FLUSH 100 UNIT/ML IV SOLN
500.0000 [IU] | Freq: Once | INTRAVENOUS | Status: AC | PRN
  Administered 2023-06-29: 500 [IU]

## 2023-06-29 MED ORDER — MAGNESIUM SULFATE 2 GM/50ML IV SOLN
2.0000 g | Freq: Once | INTRAVENOUS | Status: AC
  Administered 2023-06-29: 2 g via INTRAVENOUS
  Filled 2023-06-29: qty 50

## 2023-06-29 MED ORDER — SODIUM CHLORIDE 0.9 % IV SOLN: 5.0000 mg | Freq: Once | INTRAVENOUS | Status: DC

## 2023-06-29 MED ORDER — PALONOSETRON HCL INJECTION 0.25 MG/5ML
0.2500 mg | Freq: Once | INTRAVENOUS | Status: AC
  Administered 2023-06-29: 0.25 mg via INTRAVENOUS
  Filled 2023-06-29: qty 5

## 2023-06-29 MED ORDER — SODIUM CHLORIDE 0.9 % IV SOLN: Freq: Once | INTRAVENOUS | Status: AC

## 2023-06-29 MED ORDER — POTASSIUM CHLORIDE IN NACL 20-0.9 MEQ/L-% IV SOLN
Freq: Once | INTRAVENOUS | Status: AC
  Filled 2023-06-29: qty 1000

## 2023-06-29 MED ORDER — SODIUM CHLORIDE 0.9 % IV SOLN
1000.0000 mg/m2 | Freq: Once | INTRAVENOUS | Status: AC
  Administered 2023-06-29: 2204 mg via INTRAVENOUS
  Filled 2023-06-29: qty 57.97

## 2023-06-29 MED ORDER — SODIUM CHLORIDE 0.9 % IV SOLN
150.0000 mg | Freq: Once | INTRAVENOUS | Status: AC
  Administered 2023-06-29: 150 mg via INTRAVENOUS
  Filled 2023-06-29: qty 150

## 2023-06-29 NOTE — Assessment & Plan Note (Signed)
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 -He is clinically doing well, asymptomatic.  Surgical resection was discussed with patient, but he would like to try nonsurgical options. -He started [facility] on 01/12/23, stopped in June 2024 due to disease progression. 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 -I recommended neoadjuvant chemotherapy with cisplatin and gemcitabine for 4 cycles, followed by total cystectomy and prostatectomy. -He started chemotherapy on June 08, 2023.  He tolerated first dose overall well.

## 2023-06-29 NOTE — Patient Instructions (Signed)
  [facility]  Discharge Instructions: Thank you for choosing [facility] to provide your oncology and hematology care.   If you have a lab appointment with the Cancer Center, please go directly to the Cancer Center and check in at the registration area.   Wear comfortable clothing and clothing appropriate for easy access to any Portacath or PICC line.   We strive to give you quality time with your provider. You may need to reschedule your appointment if you arrive late (15 or more minutes).  Arriving late affects you and other patients whose appointments are after yours.  Also, if you miss three or more appointments without notifying the office, you may be dismissed from the clinic at the provider's discretion.      For prescription refill requests, have your pharmacy contact our office and allow 72 hours for refills to be completed.    Today you received the following chemotherapy and/or immunotherapy agents Gemzar, Cisplatin.      To help prevent nausea and vomiting after your treatment, we encourage you to take your nausea medication as directed.  BELOW ARE SYMPTOMS THAT SHOULD BE REPORTED IMMEDIATELY: *FEVER GREATER THAN 100.4 F (38 C) OR HIGHER *CHILLS OR SWEATING *NAUSEA AND VOMITING THAT IS NOT CONTROLLED WITH YOUR NAUSEA MEDICATION *UNUSUAL SHORTNESS OF BREATH *UNUSUAL BRUISING OR BLEEDING *URINARY PROBLEMS (pain or burning when urinating, or frequent urination) *BOWEL PROBLEMS (unusual diarrhea, constipation, pain near the anus) TENDERNESS IN MOUTH AND THROAT WITH OR WITHOUT PRESENCE OF ULCERS (sore throat, sores in mouth, or a toothache) UNUSUAL RASH, SWELLING OR PAIN  UNUSUAL VAGINAL DISCHARGE OR ITCHING   Items with * indicate a potential emergency and should be followed up as soon as possible or go to the Emergency Department if any problems should occur.  Please show the CHEMOTHERAPY ALERT CARD or IMMUNOTHERAPY ALERT  CARD at check-in to the Emergency Department and triage nurse.  Should you have questions after your visit or need to cancel or reschedule your appointment, please contact [facility]  Dept: [phone]  and follow the prompts.  Office hours are 8:00 a.m. to 4:30 p.m. Monday - Friday. Please note that voicemails left after 4:00 p.m. may not be returned until the following business day.  We are closed weekends and major holidays. You have access to a nurse at all times for urgent questions. Please call the main number to the clinic Dept: [phone] and follow the prompts.   For any non-urgent questions, you may also contact your provider using MyChart. We now offer e-Visits for anyone 18 and older to request care online for non-urgent symptoms. For details visit [email or web address].   Also download the MyChart app! Go to the app store, search "MyChart", open the app, select [facility], and log in with your MyChart username and password.   

## 2023-06-29 NOTE — Progress Notes (Signed)
[facility]   [phone] [phone]   Clinic Follow up Note   Patient Care Team: Plotnikov, [name], MD as PCP - General (Internal Medicine) Nahser, Deloris Ping, MD as PCP - Cardiology (Cardiology) Verner Chol, [facility] (Inactive) as Pharmacist (Pharmacist) Carlus Pavlov, MD as Consulting Physician (Internal Medicine) Jerilee Field, MD as Consulting Physician (Urology) Iva Boop, MD as Consulting Physician (Gastroenterology) Janet Berlin, MD as Consulting Physician (Ophthalmology) Malachy Mood, MD as Attending Physician (Hematology and Oncology)  Date of Service:  06/29/2023  CHIEF COMPLAINT: f/u of recurrent bladder cancer   CURRENT THERAPY:    cisplatin and gemcitabine Q 21 days for 4 cycles   ASSESSMENT:  Robert [name] is a 72 y.o. 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 -He is clinically doing well, asymptomatic.  Surgical resection was discussed with patient, but he would like to try nonsurgical options. -He started [facility] on 01/12/23, stopped in June 2024 due to disease progression. 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 -I recommended neoadjuvant chemotherapy with cisplatin and gemcitabine for 4 cycles, followed by total cystectomy and prostatectomy. -He started chemotherapy on June 08, 2023.  He tolerated first dose overall well.    PLAN: -lab reviewed - I prescribe oxybutynin for bladder spasm and urinary symptoms  -proceed with C2 gemzar/cisplatin today -reduce dex by half due to increase glucose -lab/flush and treatment 9/12   SUMMARY OF ONCOLOGIC HISTORY: Oncology History Overview Note   Cancer Staging  Bladder cancer [facility]) Staging form: Urinary Bladder, AJCC 8th Edition - Clinical stage from 05/14/2021: Stage 0a (cTa, cN0, cM0) - Signed by Malachy Mood, MD on 01/02/2023 Stage prefix: Initial diagnosis WHO/ISUP grade (low/high): High Grade Histologic grading system: 2 grade system - Pathologic stage from 02/22/2022: Stage I (pT1, pN0, cM0) - Signed by Malachy Mood, MD on 01/02/2023 WHO/ISUP grade (low/high): High Grade Histologic grading system: 2 grade system     Bladder cancer (HCC)  05/14/2021 Cancer Staging   Staging form: Urinary Bladder, AJCC 8th Edition - Clinical stage from 05/14/2021: Stage 0a (cTa, cN0, cM0) - Signed by Malachy Mood, MD on 01/02/2023 Stage prefix: Initial diagnosis WHO/ISUP grade (low/high): High Grade Histologic grading system: 2 grade system   01/31/2022 Initial Diagnosis   Bladder cancer (HCC)   02/22/2022 Cancer Staging   Staging form: Urinary Bladder, AJCC 8th Edition - Pathologic stage from 02/22/2022: Stage I (pT1, pN0, cM0) - Signed by Malachy Mood, MD on 01/02/2023 WHO/ISUP grade (low/high): High Grade Histologic grading system: 2 grade system   01/12/2023 - 04/06/2023 Chemotherapy   Patient is on Treatment Plan : BLADDER Pembrolizumab (200) q21d     05/05/2023 Pathology Results     FINAL MICROSCOPIC DIAGNOSIS:   A. BLADDER, RIGHT POSTERIOR, TURBT:  Noninvasive high grade papillary urothelial carcinoma  Muscularis propria (detrusor muscle) is not present   B. BLADDER NECK AND PROSTATIC URETHRA, BIOPSY:  High grade  urothelial carcinoma  The carcinoma focally invades muscularis propria (detrusor muscle)     05/05/2023 Cancer  Staging   Staging form: Urinary Bladder, AJCC 8th Edition - Clinical stage from 05/05/2023: Stage [name] (cT2, cN0, cM0) - Signed by Malachy Mood, MD on 06/15/2023 WHO/ISUP grade (low/high): High Grade Histologic grading system: 2 grade system   06/01/2023 Imaging   CT Chest, Abdomen, and Pelvis with contrast  IMPRESSION: 1. Similar irregular asymmetric wall thickening along the posterior urinary bladder with progressive irregular thickening of the right ureter extending to the level of the renal pelvis where there is now a masslike 16 x 12 mm soft tissue focus, suspicious for direct ureteral extension of disease into the renal pelvis. 2. Irregular asymmetric wall thickening along the posterior urinary bladder measuring 14 mm is similar prior. 3. Right double-J ureteral stent is in place with persistent perinephric/periureteric stranding without significant hydronephrosis. 4. No convincing evidence of metastatic disease in the chest, abdomen or pelvis 5. Pancolonic diverticulosis without findings of acute diverticulitis. 6. Aortic Atherosclerosis (ICD10-I70.0) and Emphysema (ICD10-J43.9).   06/09/2023 -  Chemotherapy   Patient is on Treatment Plan : BLADDER Cisplatin D1 + Gemcitabine D1,8 q21d x 4 Cycles        INTERVAL HISTORY:  Robert [name] is here for a follow up of recurrent bladder cancer. He was last seen by me on 06/15/2023. He presents to the clinic alone. Pt state that he is still have some urgency, frequent urination, and some discomfort.He reports that it has been going on for a while, and he is not taking any medication for this issue. Pt also states that he has some fatigue after the third day of receiving treatment. Pt denies having any numbness and tingling in fingers and toes.     All other systems were reviewed with the patient and are negative.  MEDICAL HISTORY:  Past Medical History:  Diagnosis Date   Anticoagulant long-term use    eliquis--- managed by cardiology    Arthritis    Bladder cancer [facility])    urologist--- dr Mena Goes;  overlapping   History of basal cell carcinoma (BCC) excision    History of COVID-19 05/2019   per pt mild symptoms that resolved   HLD (hyperlipidemia)    HTN (hypertension)    Hx of colonic polyps    Insulin dependent type 2 diabetes mellitus [facility])    endocrinologist--- dr Elvera Lennox --  (03-11-2022 per pt check blood sugar 1-2 times daily,  fasting sugar-- 118-120s)   Nocturia    Paroxysmal atrial fibrillation (HCC) 03/12/2019   cardiologist--- dr Melburn Popper   Ureterocele    Use of cane as ambulatory aid     SURGICAL HISTORY: Past Surgical History:  Procedure Laterality Date   BLEPHAROPLASTY Bilateral    per pt approx  2001;   upper eyelid's   COLONOSCOPY     last one approx 2023   CYSTOSCOPY WITH URETEROSCOPY AND STENT PLACEMENT Right 12/02/2022   Procedure: CYSTOSCOPY WITH RIGHT URETEROSCOPY, RIGHT DIAGNOSTIC URETEROSCOPY;  Surgeon: Jerilee Field, MD;  Location: [facility];  Service: Urology;  Laterality: Right;  90 MINS FOR CASE   CYSTOSCOPY/RETROGRADE/URETEROSCOPY Bilateral 05/05/2023   Procedure: CYSTOSCOPY BILATERAL RETROGRADE PYELOGRAM RIGHT URETEROSCOPY RIGHT STENT PLACEMENT;  Surgeon: Jerilee Field, MD;  Location: [facility];  Service: Urology;  Laterality: Bilateral;  90 MINS FOR CASE   IR IMAGING GUIDED PORT INSERTION  05/30/2023   PATELLAR TENDON REPAIR Left 04/16/2004   @[facility]    TRANSURETHRAL RESECTION  OF BLADDER TUMOR N/A 06/18/2021   Procedure: TRANSURETHRAL RESECTION OF BLADDER TUMOR (TURBT) WITH RIGHT URETERAL STENT PLACEMENT, RIGHT URETEROSCOPY WITH DISTRACTION OF TUMOR, FULGURATION;  Surgeon: Jerilee Field, MD;  Location: [facility];  Service: Urology;  Laterality: N/A;   TRANSURETHRAL RESECTION OF BLADDER TUMOR N/A 03/15/2022   Procedure: TRANSURETHRAL RESECTION OF BLADDER TUMOR (TURBT) WITH BILATERAL URETEROSCOPY/URETHRAL/ DILATION/BILATERAL RETROGRADE PYELOGRAM/BIOPSY  AND FULGURATION OF RIGHT URETERAL CANCER/RIGHT STENT PLACEMENT;  Surgeon: Jerilee Field, MD;  Location: [facility];  Service: Urology;  Laterality: N/A;   TRANSURETHRAL RESECTION OF BLADDER TUMOR N/A 05/05/2023   Procedure: TRANSURETHRAL RESECTION OF BLADDER TUMOR (TURBT) BLADDER BIOSPY;  Surgeon: Jerilee Field, MD;  Location: [facility];  Service: Urology;  Laterality: N/A;   TRANSURETHRAL RESECTION OF BLADDER TUMOR WITH MITOMYCIN-C N/A 12/02/2022   Procedure: TRANSURETHRAL RESECTION OF BLADDER TUMOR;  Surgeon: Jerilee Field, MD;  Location: [facility];  Service: Urology;  Laterality: N/A;   TRANSURETHRAL RESECTION OF PROSTATE  05/05/2023   Procedure: TRANSURETHRAL RESECTION OF THE PROSTATE (TURP);  Surgeon: Jerilee Field, MD;  Location: [facility];  Service: Urology;;    I have reviewed the social history and family history with the patient and they are unchanged from previous note.  ALLERGIES:  has No Known Allergies.  MEDICATIONS:  Current Outpatient Medications  Medication Sig Dispense Refill   oxybutynin (DITROPAN XL) 10 MG 24 hr tablet Take 1 tablet (10 mg total) by mouth at bedtime. 30 tablet 0   acetaminophen (TYLENOL) 500 MG tablet Take 1,000 mg by mouth every 6 (six) hours as needed.     atorvastatin (LIPITOR) 40 MG tablet TAKE ONE TABLET BY MOUTH ONCE DAILY (Patient taking differently: at bedtime.) 90 tablet 1   B Complex-C (B-COMPLEX WITH VITAMIN C) tablet Take 1 tablet by mouth daily.     Cholecalciferol (VITAMIN D-3 PO) Take by mouth daily.     ELIQUIS 5 MG TABS tablet TAKE ONE TABLET BY MOUTH TWICE DAILY 60 tablet 5   fluticasone (FLONASE) 50 MCG/ACT nasal spray Place 1 spray into both nostrils daily as needed for allergies or rhinitis.     glipiZIDE (GLUCOTROL) 5 MG tablet TAKE 1/2 TABLET BY MOUTH EVERY EVENING 45 tablet 3   glucose blood test strip 1 each by Other route daily. Use daily for glucose control - Dx 250.00  100 each 0   hydrochlorothiazide (MICROZIDE) 12.5 MG capsule Take 1 capsule (12.5 mg total) by mouth daily. 30 capsule 11   lidocaine-prilocaine (EMLA) cream Apply to affected area once 30 g 3   lisinopril (ZESTRIL) 40 MG tablet Take 1 tablet (40 mg total) by mouth daily. 90 tablet 3   metFORMIN (GLUCOPHAGE-XR) 500 MG 24 hr tablet Take 4 tablets (2,000 mg total) by mouth daily with supper. 360 tablet 3   metoprolol succinate (TOPROL-XL) 25 MG 24 hr tablet TAKE ONE TABLET BY MOUTH ONCE DAILY (Patient taking differently: at bedtime.) 90 tablet 3   Multiple Vitamins-Minerals (EMERGEN-C VITAMIN C) PACK Take 1 packet by mouth daily as needed (immune support).     ondansetron (ZOFRAN) 8 MG tablet Take 1 tablet (8 mg total) by mouth every 8 (eight) hours as needed for nausea or vomiting. Start on the third day after cisplatin. 30 tablet 1   prochlorperazine (COMPAZINE) 10 MG tablet Take 1 tablet (10 mg total) by mouth every 6 (six) hours as needed (Nausea or vomiting). 30 tablet 1   propranolol (INDERAL) 10 MG tablet TAKE 1  TABLET BY MOUTH 4 TIMES DAILY AS NEEDED FOR PALPITATIONS, IRREGULAR OR FAST HEART RATE 270 tablet 1   Semaglutide, 1 MG/DOSE, (OZEMPIC, 1 MG/DOSE,) 4 MG/3ML SOPN INJECT 1 MG ONCE A WEEK AS DIRECTED (Patient taking differently: TUESDAY) 3 mL 2   sildenafil (REVATIO) 20 MG tablet TAKE 1 TO 5 TABLETS 2 TO 3 HOURS BEFORE SEX 60 tablet 5   tamsulosin (FLOMAX) 0.4 MG CAPS capsule Take 0.4 mg by mouth at bedtime.     traMADol (ULTRAM) 50 MG tablet Take 1 tablet (50 mg total) by mouth every 6 (six) hours as needed for severe pain. 20 tablet 0   No current facility-administered medications for this visit.   Facility-Administered Medications Ordered in Other Visits  Medication Dose Route Frequency Provider Last Rate Last Admin   CISplatin (PLATINOL) 150 mg in sodium chloride 0.9 % 500 mL chemo infusion  70 mg/m2 (Order-Specific) Intravenous Once Malachy Mood, MD       dexamethasone (DECADRON)  injection 5 mg  5 mg Intravenous Once Malachy Mood, MD       fosaprepitant (EMEND) 150 mg in sodium chloride 0.9 % 145 mL IVPB  150 mg Intravenous Once Malachy Mood, MD       gemcitabine (GEMZAR) 2,204 mg in sodium chloride 0.9 % 250 mL chemo infusion  1,000 mg/m2 (Order-Specific) Intravenous Once Malachy Mood, MD       heparin lock flush 100 unit/mL  500 Units Intracatheter Once PRN Malachy Mood, MD       magnesium sulfate IVPB 2 g 50 mL  2 g Intravenous Once Malachy Mood, MD 50 mL/hr at 06/29/23 0925 2 g at 06/29/23 0925   palonosetron (ALOXI) injection 0.25 mg  0.25 mg Intravenous Once Malachy Mood, MD       sodium chloride flush (NS) 0.9 % injection 10 mL  10 mL Intracatheter PRN Malachy Mood, MD        PHYSICAL EXAMINATION: ECOG PERFORMANCE STATUS: 1 - Symptomatic but completely ambulatory  Vitals:   06/29/23 0823  BP: (!) 125/48  Pulse: 79  Resp: 15  Temp: 98 F (36.7 C)  SpO2: 100%   Wt Readings from Last 3 Encounters:  06/29/23 203 lb 12.8 oz (92.4 kg)  06/15/23 200 lb 9.6 oz (91 kg)  06/12/23 205 lb (93 kg)     GENERAL:alert, no distress and comfortable SKIN: skin color normal, no rashes or significant lesions EYES: normal, Conjunctiva are pink and non-injected, sclera clear  NEURO: alert & oriented x 3 with fluent speech LABORATORY DATA:  I have reviewed the data as listed    Latest Ref Rng & Units 06/29/2023    8:08 AM 06/15/2023   12:46 PM 06/09/2023    8:25 AM  CBC  WBC 4.0 - 10.5 K/uL 5.4  7.1  11.3   Hemoglobin 13.0 - 17.0 g/dL 13.0  86.5  78.4   Hematocrit 39.0 - 52.0 % 34.9  40.9  43.3   Platelets 150 - 400 K/uL 458  151  201         Latest Ref Rng & Units 06/29/2023    8:08 AM 06/15/2023   12:46 PM 06/09/2023    8:25 AM  CMP  Glucose 70 - 99 mg/dL 696  295  284   BUN 8 - 23 mg/dL 18  29  17    Creatinine 0.61 - 1.24 mg/dL 1.32  4.40  1.02   Sodium 135 - 145 mmol/L 133  132  135   Potassium 3.5 -  5.1 mmol/L 4.4  3.9  4.0   Chloride 98 - 111 mmol/L 101  97  106   CO2 22 -  32 mmol/L 25  26  23    Calcium 8.9 - 10.3 mg/dL 9.0  9.5  8.6   Total Protein 6.5 - 8.1 g/dL 6.3  6.7  6.8   Total Bilirubin 0.3 - 1.2 mg/dL 0.2  0.7  0.6   Alkaline Phos 38 - 126 U/L 75  72  78   AST 15 - 41 U/L 11  14  12    ALT 0 - 44 U/L 11  21  10        RADIOGRAPHIC STUDIES: I have personally reviewed the radiological images as listed and agreed with the findings in the report. No results found.    No orders of the defined types were placed in this encounter.  All questions were answered. The patient knows to call the clinic with any problems, questions or concerns. No barriers to learning was detected. The total time spent in the appointment was 25 minutes.     Malachy Mood, MD 06/29/2023   Carolin Coy, CMA, am acting as scribe for Malachy Mood, MD.   I have reviewed the above documentation for accuracy and completeness, and I agree with the above.

## 2023-06-29 NOTE — Addendum Note (Signed)
Encounter addended by: Lockie Pares, RN on: 06/29/2023 12:36 PM  Actions taken: Charge Capture section accepted

## 2023-07-03 ENCOUNTER — Ambulatory Visit (INDEPENDENT_AMBULATORY_CARE_PROVIDER_SITE_OTHER): Payer: Medicare HMO | Admitting: Internal Medicine

## 2023-07-03 ENCOUNTER — Encounter: Payer: Self-pay | Admitting: Internal Medicine

## 2023-07-03 VITALS — BP 112/60 | HR 76 | Temp 98.0°F | Ht 75.0 in | Wt 201.0 lb

## 2023-07-03 DIAGNOSIS — I10 Essential (primary) hypertension: Secondary | ICD-10-CM | POA: Diagnosis not present

## 2023-07-03 DIAGNOSIS — I480 Paroxysmal atrial fibrillation: Secondary | ICD-10-CM

## 2023-07-03 DIAGNOSIS — E663 Overweight: Secondary | ICD-10-CM

## 2023-07-03 DIAGNOSIS — E119 Type 2 diabetes mellitus without complications: Secondary | ICD-10-CM

## 2023-07-03 DIAGNOSIS — Z7984 Long term (current) use of oral hypoglycemic drugs: Secondary | ICD-10-CM

## 2023-07-03 DIAGNOSIS — M1611 Unilateral primary osteoarthritis, right hip: Secondary | ICD-10-CM | POA: Diagnosis not present

## 2023-07-03 DIAGNOSIS — Z7985 Long-term (current) use of injectable non-insulin antidiabetic drugs: Secondary | ICD-10-CM

## 2023-07-03 DIAGNOSIS — C675 Malignant neoplasm of bladder neck: Secondary | ICD-10-CM | POA: Diagnosis not present

## 2023-07-03 MED ORDER — TADALAFIL 5 MG PO TABS
5.0000 mg | ORAL_TABLET | Freq: Every day | ORAL | 11 refills | Status: DC
Start: 2023-07-03 — End: 2024-02-07

## 2023-07-03 MED ORDER — TRAMADOL HCL 50 MG PO TABS: 50.0000 mg | ORAL_TABLET | Freq: Four times a day (QID) | ORAL | 2 refills | Status: DC | PRN

## 2023-07-03 NOTE — Assessment & Plan Note (Signed)
Robert [name] had diarrhea. Take OTC Mag tabs

## 2023-07-03 NOTE — Assessment & Plan Note (Signed)
Wt Readings from Last 3 Encounters:  07/03/23 201 lb (91.2 kg)  06/29/23 203 lb 12.8 oz (92.4 kg)  06/15/23 200 lb 9.6 oz (91 kg)  Continue with Ozempic

## 2023-07-03 NOTE — Assessment & Plan Note (Signed)
Continue with Ozempic and glipizide, Metformin

## 2023-07-03 NOTE — Assessment & Plan Note (Signed)
On Eliquis Continue with  Toprol

## 2023-07-03 NOTE — Progress Notes (Addendum)
Subjective:  Patient ID: Robert [name], male    DOB: 12-27-1950  Age: 72 y.o. MRN: [id number]  CC: No chief complaint on file.   HPI Gilberto Better II presents for bladder cancer w/mets On chemo C/o fatigue, nocturia, incontinence, OA CBGs ok  Outpatient Medications Prior to Visit  Medication Sig Dispense Refill   acetaminophen (TYLENOL) 500 MG tablet Take 1,000 mg by mouth every 6 (six) hours as needed.     atorvastatin (LIPITOR) 40 MG tablet TAKE ONE TABLET BY MOUTH ONCE DAILY (Patient taking differently: at bedtime.) 90 tablet 1   B Complex-C (B-COMPLEX WITH VITAMIN C) tablet Take 1 tablet by mouth daily.     Cholecalciferol (VITAMIN D-3 PO) Take by mouth daily.     ELIQUIS 5 MG TABS tablet TAKE ONE TABLET BY MOUTH TWICE DAILY 60 tablet 5   fluticasone (FLONASE) 50 MCG/ACT nasal spray Place 1 spray into both nostrils daily as needed for allergies or rhinitis.     glipiZIDE (GLUCOTROL) 5 MG tablet TAKE 1/2 TABLET BY MOUTH EVERY EVENING 45 tablet 3   glucose blood test strip 1 each by Other route daily. Use daily for glucose control - Dx 250.00 100 each 0   lisinopril (ZESTRIL) 40 MG tablet Take 1 tablet (40 mg total) by mouth daily. 90 tablet 3   metFORMIN (GLUCOPHAGE-XR) 500 MG 24 hr tablet Take 4 tablets (2,000 mg total) by mouth daily with supper. 360 tablet 3   metoprolol succinate (TOPROL-XL) 25 MG 24 hr tablet TAKE ONE TABLET BY MOUTH ONCE DAILY (Patient taking differently: at bedtime.) 90 tablet 3   Multiple Vitamins-Minerals (EMERGEN-C VITAMIN C) PACK Take 1 packet by mouth daily as needed (immune support).     oxybutynin (DITROPAN XL) 10 MG 24 hr tablet Take 1 tablet (10 mg total) by mouth at bedtime. 30 tablet 0   propranolol (INDERAL) 10 MG tablet TAKE 1 TABLET BY MOUTH 4 TIMES DAILY AS NEEDED FOR PALPITATIONS, IRREGULAR OR FAST HEART RATE 270 tablet 1   Semaglutide, 1 MG/DOSE, (OZEMPIC, 1 MG/DOSE,) 4 MG/3ML SOPN INJECT 1 MG ONCE A WEEK AS DIRECTED (Patient  taking differently: TUESDAY) 3 mL 2   Sesame Oil OIL Sesame Oil (organic) ([employer] is a good brand)  1 TBSP twice daily. Sesame oil may help improve platelet counts; can take orally and also rub into dry skin areas such as the feet. Can add to salads as a dressing with oil/vinegar or dip with bread. Or take straight. Make sure it is cold pressed or expeller pressed. Make sure it is NOT the toasted sesame oil     tamsulosin (FLOMAX) 0.4 MG CAPS capsule Take 0.4 mg by mouth at bedtime.     TURMERIC PO Curcumin Phytosome by Jacinto Reap  2 capsules twice daily to reduce inflammation, help with immune function; also has cancer prevention properties     hydrochlorothiazide (MICROZIDE) 12.5 MG capsule Take 1 capsule (12.5 mg total) by mouth daily. 30 capsule 11   sildenafil (REVATIO) 20 MG tablet TAKE 1 TO 5 TABLETS 2 TO 3 HOURS BEFORE SEX 60 tablet 5   traMADol (ULTRAM) 50 MG tablet Take 1 tablet (50 mg total) by mouth every 6 (six) hours as needed for severe pain. 20 tablet 0   lidocaine-prilocaine (EMLA) cream Apply to affected area once (Patient not taking: Reported on 07/03/2023) 30 g 3   ondansetron (ZOFRAN) 8 MG tablet Take 1 tablet (8 mg total) by mouth every 8 (eight) hours  as needed for nausea or vomiting. Start on the third day after cisplatin. (Patient not taking: Reported on 07/03/2023) 30 tablet 1   prochlorperazine (COMPAZINE) 10 MG tablet Take 1 tablet (10 mg total) by mouth every 6 (six) hours as needed (Nausea or vomiting). (Patient not taking: Reported on 07/03/2023) 30 tablet 1   No facility-administered medications prior to visit.    ROS: Review of Systems  Constitutional:  Positive for fatigue. Negative for appetite change and unexpected weight change.  HENT:  Negative for congestion, nosebleeds, sneezing, sore throat and trouble swallowing.   Eyes:  Negative for itching and visual disturbance.  Respiratory:  Negative for cough.   Cardiovascular:  Negative for chest pain,  palpitations and leg swelling.  Gastrointestinal:  Negative for abdominal distention, blood in stool, diarrhea and nausea.  Genitourinary:  Positive for decreased urine volume, difficulty urinating, enuresis and urgency. Negative for dysuria, frequency, genital sores and hematuria.  Musculoskeletal:  Positive for arthralgias and gait problem. Negative for back pain, joint swelling and neck pain.  Skin:  Negative for rash.  Neurological:  Negative for dizziness, tremors, speech difficulty and weakness.  Psychiatric/Behavioral:  Negative for agitation, dysphoric mood and sleep disturbance. The patient is not nervous/anxious.     Objective:  BP 112/60 (BP Location: Left Arm, Patient Position: Sitting, Cuff Size: Large)   Pulse 76   Temp 98 F (36.7 C) (Oral)   Ht 6\' 3"  (1.905 m)   Wt 201 lb (91.2 kg)   SpO2 98%   BMI 25.12 kg/m   BP Readings from Last 3 Encounters:  07/03/23 112/60  06/29/23 (!) 125/48  06/15/23 106/62    Wt Readings from Last 3 Encounters:  07/03/23 201 lb (91.2 kg)  06/29/23 203 lb 12.8 oz (92.4 kg)  06/15/23 200 lb 9.6 oz (91 kg)    Physical Exam Constitutional:      General: He is not in acute distress.    Appearance: He is well-developed. He is obese.     Comments: NAD  Eyes:     Conjunctiva/sclera: Conjunctivae normal.     Pupils: Pupils are equal, round, and reactive to light.  Neck:     Thyroid: No thyromegaly.     Vascular: No JVD.  Cardiovascular:     Rate and Rhythm: Normal rate and regular rhythm.     Heart sounds: Normal heart sounds. No murmur heard.    No friction rub. No gallop.  Pulmonary:     Effort: Pulmonary effort is normal. No respiratory distress.     Breath sounds: Normal breath sounds. No wheezing or rales.  Chest:     Chest wall: No tenderness.  Abdominal:     General: Bowel sounds are normal. There is no distension.     Palpations: Abdomen is soft. There is no mass.     Tenderness: There is no abdominal tenderness. There  is no guarding or rebound.  Musculoskeletal:        General: Tenderness present. Normal range of motion.     Cervical back: Normal range of motion.     Right lower leg: No edema.     Left lower leg: No edema.  Lymphadenopathy:     Cervical: No cervical adenopathy.  Skin:    General: Skin is warm and dry.     Findings: No rash.  Neurological:     Mental Status: He is alert and oriented to person, place, and time.     Cranial Nerves: No cranial nerve deficit.  Motor: No weakness or abnormal muscle tone.     Coordination: Coordination normal.     Gait: Gait abnormal.     Deep Tendon Reflexes: Reflexes are normal and symmetric.  Psychiatric:        Behavior: Behavior normal.        Thought Content: Thought content normal.        Judgment: Judgment normal.    Using a cane R hip w/pain  portacath    A total time of 45 minutes was spent preparing to see the patient, reviewing tests, x-rays, operative reports and other medical records.  Also, obtaining history and performing comprehensive physical exam.  Additionally, counseling the patient regarding the above listed issues - cancer, chemo, other problems and sx's    Lab Results  Component Value Date   WBC 5.4 06/29/2023   HGB 12.2 (L) 06/29/2023   HCT 34.9 (L) 06/29/2023   PLT 458 (H) 06/29/2023   GLUCOSE 208 (H) 06/29/2023   CHOL 116 02/27/2023   TRIG 150.0 (H) 02/27/2023   HDL 38.60 (L) 02/27/2023   LDLDIRECT 77.0 07/07/2015   LDLCALC 47 02/27/2023   ALT 11 06/29/2023   AST 11 (L) 06/29/2023   NA 133 (L) 06/29/2023   K 4.4 06/29/2023   CL 101 06/29/2023   CREATININE 0.84 06/29/2023   BUN 18 06/29/2023   CO2 25 06/29/2023   TSH 1.06 02/27/2023   PSA 6.51 (H) 02/27/2023   HGBA1C 6.5 (A) 06/12/2023   MICROALBUR 4.0 (H) 01/28/2021    CT CHEST ABDOMEN PELVIS W CONTRAST  Result Date: 06/01/2023 CLINICAL DATA:  Bladder cancer, staging.  * Tracking Code: BO * EXAM: CT CHEST, ABDOMEN, AND PELVIS WITH CONTRAST  TECHNIQUE: Multidetector CT imaging of the chest, abdomen and pelvis was performed following the standard protocol during bolus administration of intravenous contrast. RADIATION DOSE REDUCTION: This exam was performed according to the departmental dose-optimization program which includes automated exposure control, adjustment of the mA and/or kV according to patient size and/or use of iterative reconstruction technique. CONTRAST:  OMNIPAQUE IOHEXOL 300 MG/ML  SOLN COMPARISON:  Multiple priors including CT January 09, 2023 and MRI June 07, 2021. FINDINGS: CT CHEST FINDINGS Cardiovascular: Right chest wall Port-A-Cath with tip near the superior cavoatrial junction. Aortic atherosclerosis. No central pulmonary embolus on this nondedicated study. Normal size heart. Aortic atherosclerosis. Calcifications of the mitral annulus. Calcifications of the aortic valve. Coronary artery calcifications Mediastinum/Nodes: No suspicious thyroid nodule. No pathologically enlarged mediastinal, hilar or axillary lymph nodes. The esophagus is grossly unremarkable. Lungs/Pleura: No suspicious pulmonary nodules or masses. Mild apical paraseptal emphysema. No pleural effusion. No pneumothorax. Musculoskeletal: No aggressive lytic or blastic lesion of bone. Multilevel degenerative change of the spine. CT ABDOMEN PELVIS FINDINGS Hepatobiliary: No suspicious hepatic lesion. Gallbladder is unremarkable. No biliary ductal dilation. Pancreas: No pancreatic ductal dilation or evidence of acute inflammation. Spleen: No splenomegaly. Adrenals/Urinary Tract: 16 mm right adrenal adenoma stable. Stable left adrenal thickening favor hyperplasia. Right double-J ureteral stent is in place with persistent perinephric/periureteric stranding without significant hydronephrosis. Progressive irregular thickening of the right ureter extending to the level of the renal pelvis where there is now a masslike 16 x 12 mm soft tissue focus on image 82/2.  Technically too small to accurately characterize bilateral renal lesions are stable from prior. Irregular asymmetric wall thickening along the posterior urinary bladder measuring 14 mm on image 122/2 is similar prior. Stomach/Bowel: No radiopaque enteric contrast material was administered. Stomach is unremarkable for degree of  distension. No pathologic dilation of small or large bowel. Normal appendix. Pancolonic diverticulosis without findings of acute diverticulitis Vascular/Lymphatic: Aortic atherosclerosis. Normal caliber abdominal aorta. Smooth IVC contours. No pathologically enlarged abdominal or pelvic lymph nodes. Reproductive: Prostate is unremarkable. Other: No significant abdominopelvic free fluid Musculoskeletal: No aggressive lytic or blastic lesion of bone. Severe right and moderate left hip degenerative change. Increased size of a well-circumscribed cystic lesion measuring 3.1 cm on image 113/3 which appears to arise from the right hip joint space sitting lateral to the acetabulum with a smaller but similar appearing area adjacent to the right hip on image 104/5 likely reflecting a gluteal bursa. Multilevel degenerative changes spine. IMPRESSION: 1. Similar irregular asymmetric wall thickening along the posterior urinary bladder with progressive irregular thickening of the right ureter extending to the level of the renal pelvis where there is now a masslike 16 x 12 mm soft tissue focus, suspicious for direct ureteral extension of disease into the renal pelvis. 2. Irregular asymmetric wall thickening along the posterior urinary bladder measuring 14 mm is similar prior. 3. Right double-J ureteral stent is in place with persistent perinephric/periureteric stranding without significant hydronephrosis. 4. No convincing evidence of metastatic disease in the chest, abdomen or pelvis 5. Pancolonic diverticulosis without findings of acute diverticulitis. 6. Aortic Atherosclerosis (ICD10-I70.0) and Emphysema  (ICD10-J43.9). Electronically Signed   By: Maudry Mayhew M.D.   On: 06/01/2023 16:54    Assessment & Plan:   Problem List Items Addressed This Visit     Essential hypertension    Wt Readings from Last 3 Encounters:  07/03/23 201 lb (91.2 kg)  06/29/23 203 lb 12.8 oz (92.4 kg)  06/15/23 200 lb 9.6 oz (91 kg)   D/c hydrochlorothiazide due to urgency/frequency      Relevant Medications   tadalafil (CIALIS) 5 MG tablet   Type 2 diabetes mellitus without complication, without long-term current use of insulin (HCC)    Continue with Ozempic and glipizide, Metformin      Overweight (BMI 25.0-29.9)    Wt Readings from Last 3 Encounters:  07/03/23 201 lb (91.2 kg)  06/29/23 203 lb 12.8 oz (92.4 kg)  06/15/23 200 lb 9.6 oz (91 kg)  Continue with Ozempic       Paroxysmal atrial fibrillation (HCC)    On Eliquis Continue with  Toprol      Relevant Medications   tadalafil (CIALIS) 5 MG tablet   Bladder cancer (HCC) - Primary    Bladder cancer (HCC) -cT2N0M0 stage II diagnosed in 04/2023 -He has had 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 -He is clinically doing well, asymptomatic.  Surgical resection was discussed with patient, but he would like to try nonsurgical options. -He started [facility] on 01/12/23, stopped in June 2024 due to disease progression. 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 -I recommended neoadjuvant chemotherapy with  cisplatin and gemcitabine for 4 cycles, followed by total cystectomy and prostatectomy. -He started chemotherapy on June 08, 2023.  He tolerated first dose overall well.  Osteoarthritis of right hip    Surgery is postponed       Relevant Medications   traMADol (ULTRAM) 50 MG tablet   Hypomagnesemia    [name] had diarrhea. Take OTC Mag tabs         Meds ordered this encounter  Medications   tadalafil (CIALIS) 5 MG tablet    Sig: Take 1 tablet (5 mg total) by mouth daily.    Dispense:  30 tablet    Refill:  11   traMADol (ULTRAM) 50 MG tablet    Sig: Take 1 tablet (50 mg total) by mouth every 6 (six) hours as needed for severe pain.    Dispense:  60 tablet    Refill:  2      Follow-up: Return in about 3 months (around 10/02/2023) for a follow-up visit.  Sonda Primes, MD

## 2023-07-03 NOTE — Assessment & Plan Note (Signed)
Wt Readings from Last 3 Encounters:  07/03/23 201 lb (91.2 kg)  06/29/23 203 lb 12.8 oz (92.4 kg)  06/15/23 200 lb 9.6 oz (91 kg)   D/c hydrochlorothiazide due to urgency/frequency

## 2023-07-03 NOTE — Assessment & Plan Note (Signed)
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 -He is clinically doing well, asymptomatic.  Surgical resection was discussed with patient, but he would like to try nonsurgical options. -He started [facility] on 01/12/23, stopped in June 2024 due to disease progression. 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 -I recommended neoadjuvant chemotherapy with cisplatin and gemcitabine for 4 cycles, followed by total cystectomy and prostatectomy. -He started chemotherapy on June 08, 2023.  He tolerated first dose overall well.

## 2023-07-03 NOTE — Assessment & Plan Note (Signed)
Surgery is postponed.

## 2023-07-04 ENCOUNTER — Other Ambulatory Visit: Payer: Self-pay | Admitting: Internal Medicine

## 2023-07-04 DIAGNOSIS — E119 Type 2 diabetes mellitus without complications: Secondary | ICD-10-CM

## 2023-07-06 ENCOUNTER — Inpatient Hospital Stay: Payer: Medicare HMO

## 2023-07-06 VITALS — BP 118/74 | HR 87 | Temp 98.1°F | Resp 16 | Wt 198.2 lb

## 2023-07-06 DIAGNOSIS — Z7984 Long term (current) use of oral hypoglycemic drugs: Secondary | ICD-10-CM | POA: Diagnosis not present

## 2023-07-06 DIAGNOSIS — Z7985 Long-term (current) use of injectable non-insulin antidiabetic drugs: Secondary | ICD-10-CM | POA: Diagnosis not present

## 2023-07-06 DIAGNOSIS — C679 Malignant neoplasm of bladder, unspecified: Secondary | ICD-10-CM

## 2023-07-06 DIAGNOSIS — E119 Type 2 diabetes mellitus without complications: Secondary | ICD-10-CM | POA: Diagnosis not present

## 2023-07-06 DIAGNOSIS — Z95828 Presence of other vascular implants and grafts: Secondary | ICD-10-CM

## 2023-07-06 DIAGNOSIS — Z5111 Encounter for antineoplastic chemotherapy: Secondary | ICD-10-CM | POA: Diagnosis not present

## 2023-07-06 DIAGNOSIS — Z79899 Other long term (current) drug therapy: Secondary | ICD-10-CM | POA: Diagnosis not present

## 2023-07-06 LAB — CBC WITH DIFFERENTIAL (CANCER CENTER ONLY)
Abs Immature Granulocytes: 0.05 10*3/uL (ref 0.00–0.07)
Basophils Absolute: 0.1 10*3/uL (ref 0.0–0.1)
Basophils Relative: 2 %
Eosinophils Absolute: 0 10*3/uL (ref 0.0–0.5)
Eosinophils Relative: 0 %
HCT: 33.9 % — ABNORMAL LOW (ref 39.0–52.0)
Hemoglobin: 12.7 g/dL — ABNORMAL LOW (ref 13.0–17.0)
Immature Granulocytes: 1 %
Lymphocytes Relative: 56 %
Lymphs Abs: 2.2 10*3/uL (ref 0.7–4.0)
MCH: 32.6 pg (ref 26.0–34.0)
MCHC: 37.5 g/dL — ABNORMAL HIGH (ref 30.0–36.0)
MCV: 86.9 fL (ref 80.0–100.0)
Monocytes Absolute: 0.3 10*3/uL (ref 0.1–1.0)
Monocytes Relative: 7 %
Neutro Abs: 1.3 10*3/uL — ABNORMAL LOW (ref 1.7–7.7)
Neutrophils Relative %: 34 %
Platelet Count: 243 10*3/uL (ref 150–400)
RBC: 3.9 MIL/uL — ABNORMAL LOW (ref 4.22–5.81)
RDW: 12.4 % (ref 11.5–15.5)
WBC Count: 3.9 10*3/uL — ABNORMAL LOW (ref 4.0–10.5)
nRBC: 0 % (ref 0.0–0.2)

## 2023-07-06 LAB — CMP (CANCER CENTER ONLY)
ALT: 21 U/L (ref 0–44)
AST: 15 U/L (ref 15–41)
Albumin: 3.8 g/dL (ref 3.5–5.0)
Alkaline Phosphatase: 68 U/L (ref 38–126)
Anion gap: 6 (ref 5–15)
BUN: 22 mg/dL (ref 8–23)
CO2: 27 mmol/L (ref 22–32)
Calcium: 9.1 mg/dL (ref 8.9–10.3)
Chloride: 101 mmol/L (ref 98–111)
Creatinine: 0.94 mg/dL (ref 0.61–1.24)
GFR, Estimated: >60 mL/min (ref >60)
Glucose, Bld: 200 mg/dL — ABNORMAL HIGH (ref 70–99)
Potassium: 4.4 mmol/L (ref 3.5–5.1)
Sodium: 134 mmol/L — ABNORMAL LOW (ref 135–145)
Total Bilirubin: 0.5 mg/dL (ref 0.3–1.2)
Total Protein: 6.5 g/dL (ref 6.5–8.1)

## 2023-07-06 MED ORDER — SODIUM CHLORIDE 0.9% FLUSH
10.0000 mL | INTRAVENOUS | Status: DC | PRN
  Administered 2023-07-06: 10 mL

## 2023-07-06 MED ORDER — SODIUM CHLORIDE 0.9 % IV SOLN
1000.0000 mg/m2 | Freq: Once | INTRAVENOUS | Status: AC
  Administered 2023-07-06: 2204 mg via INTRAVENOUS
  Filled 2023-07-06: qty 57.97

## 2023-07-06 MED ORDER — SODIUM CHLORIDE 0.9 % IV SOLN: Freq: Once | INTRAVENOUS | Status: AC

## 2023-07-06 MED ORDER — HEPARIN SOD (PORK) LOCK FLUSH 100 UNIT/ML IV SOLN
500.0000 [IU] | Freq: Once | INTRAVENOUS | Status: AC | PRN
  Administered 2023-07-06: 500 [IU]

## 2023-07-06 MED ORDER — PROCHLORPERAZINE MALEATE 10 MG PO TABS
10.0000 mg | ORAL_TABLET | Freq: Once | ORAL | Status: AC
  Administered 2023-07-06: 10 mg via ORAL
  Filled 2023-07-06: qty 1

## 2023-07-06 NOTE — Progress Notes (Signed)
Per Dr. Mosetta Putt OK to proceed with tx today with ANC 1.3 K/uL.

## 2023-07-06 NOTE — Patient Instructions (Signed)
[facility]  Discharge Instructions: Thank you for choosing [facility] to provide your oncology and hematology care.   If you have a lab appointment with the [facility], please go directly to the [facility] and check in at the registration area.   Wear comfortable clothing and clothing appropriate for easy access to any Portacath or PICC line.   We strive to give you quality time with your provider. You may need to reschedule your appointment if you arrive late (15 or more minutes).  Arriving late affects you and other patients whose appointments are after yours.  Also, if you miss three or more appointments without notifying the office, you may be dismissed from the clinic at the provider's discretion.      For prescription refill requests, have your pharmacy contact our office and allow 72 hours for refills to be completed.    Today you received the following chemotherapy and/or immunotherapy agents: Gemzar      To help prevent nausea and vomiting after your treatment, we encourage you to take your nausea medication as directed.  BELOW ARE SYMPTOMS THAT SHOULD BE REPORTED IMMEDIATELY: *FEVER GREATER THAN 100.4 F (38 C) OR HIGHER *CHILLS OR SWEATING *NAUSEA AND VOMITING THAT IS NOT CONTROLLED WITH YOUR NAUSEA MEDICATION *UNUSUAL SHORTNESS OF BREATH *UNUSUAL BRUISING OR BLEEDING *URINARY PROBLEMS (pain or burning when urinating, or frequent urination) *BOWEL PROBLEMS (unusual diarrhea, constipation, pain near the anus) TENDERNESS IN MOUTH AND THROAT WITH OR WITHOUT PRESENCE OF ULCERS (sore throat, sores in mouth, or a toothache) UNUSUAL RASH, SWELLING OR PAIN  UNUSUAL VAGINAL DISCHARGE OR ITCHING   Items with * indicate a potential emergency and should be followed up as soon as possible or go to the Emergency Department if any problems should occur.  Please show the CHEMOTHERAPY ALERT CARD or IMMUNOTHERAPY ALERT CARD at check-in  to the Emergency Department and triage nurse.  Should you have questions after your visit or need to cancel or reschedule your appointment, please contact [facility]  Dept: [phone]  and follow the prompts.  Office hours are 8:00 a.m. to 4:30 p.m. Monday - Friday. Please note that voicemails left after 4:00 p.m. may not be returned until the following business day.  We are closed weekends and major holidays. You have access to a nurse at all times for urgent questions. Please call the main number to the clinic Dept: [phone] and follow the prompts.   For any non-urgent questions, you may also contact your provider using MyChart. We now offer e-Visits for anyone 40 and older to request care online for non-urgent symptoms. For details visit [email or web address].   Also download the MyChart app! Go to the app store, search "MyChart", open the app, select [facility], and log in with your MyChart username and password.

## 2023-07-19 MED FILL — Fosaprepitant Dimeglumine For IV Infusion 150 MG (Base Eq): INTRAVENOUS | Qty: 5 | Status: AC

## 2023-07-19 NOTE — Assessment & Plan Note (Signed)
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 -He is clinically doing well, asymptomatic.  Surgical resection was discussed with patient, but he would like to try nonsurgical options. -He started [facility] on 01/12/23, stopped in June 2024 due to disease progression. 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 -I recommended neoadjuvant chemotherapy with cisplatin and gemcitabine for 4 cycles, followed by total cystectomy and prostatectomy. -He started chemotherapy on June 08, 2023.  He tolerated first dose overall well.

## 2023-07-20 ENCOUNTER — Inpatient Hospital Stay: Payer: Medicare HMO | Admitting: Hematology

## 2023-07-20 ENCOUNTER — Other Ambulatory Visit: Payer: Self-pay | Admitting: Internal Medicine

## 2023-07-20 ENCOUNTER — Inpatient Hospital Stay: Payer: Medicare HMO

## 2023-07-20 ENCOUNTER — Inpatient Hospital Stay (HOSPITAL_BASED_OUTPATIENT_CLINIC_OR_DEPARTMENT_OTHER): Payer: Medicare HMO

## 2023-07-20 VITALS — BP 122/61 | HR 81 | Temp 98.8°F | Resp 17 | Ht 75.0 in | Wt 206.1 lb

## 2023-07-20 DIAGNOSIS — C679 Malignant neoplasm of bladder, unspecified: Secondary | ICD-10-CM

## 2023-07-20 DIAGNOSIS — Z79899 Other long term (current) drug therapy: Secondary | ICD-10-CM | POA: Diagnosis not present

## 2023-07-20 DIAGNOSIS — Z7985 Long-term (current) use of injectable non-insulin antidiabetic drugs: Secondary | ICD-10-CM | POA: Diagnosis not present

## 2023-07-20 DIAGNOSIS — C675 Malignant neoplasm of bladder neck: Secondary | ICD-10-CM

## 2023-07-20 DIAGNOSIS — E119 Type 2 diabetes mellitus without complications: Secondary | ICD-10-CM

## 2023-07-20 DIAGNOSIS — Z5111 Encounter for antineoplastic chemotherapy: Secondary | ICD-10-CM | POA: Diagnosis not present

## 2023-07-20 DIAGNOSIS — Z7984 Long term (current) use of oral hypoglycemic drugs: Secondary | ICD-10-CM | POA: Diagnosis not present

## 2023-07-20 LAB — CMP (CANCER CENTER ONLY)
ALT: 8 U/L (ref 0–44)
AST: 11 U/L — ABNORMAL LOW (ref 15–41)
Albumin: 3.7 g/dL (ref 3.5–5.0)
Alkaline Phosphatase: 80 U/L (ref 38–126)
Anion gap: 8 (ref 5–15)
BUN: 14 mg/dL (ref 8–23)
CO2: 25 mmol/L (ref 22–32)
Calcium: 9.1 mg/dL (ref 8.9–10.3)
Chloride: 102 mmol/L (ref 98–111)
Creatinine: 0.84 mg/dL (ref 0.61–1.24)
GFR, Estimated: >60 mL/min (ref >60)
Glucose, Bld: 180 mg/dL — ABNORMAL HIGH (ref 70–99)
Potassium: 4.3 mmol/L (ref 3.5–5.1)
Sodium: 135 mmol/L (ref 135–145)
Total Bilirubin: 0.3 mg/dL (ref 0.3–1.2)
Total Protein: 6.6 g/dL (ref 6.5–8.1)

## 2023-07-20 LAB — CBC WITH DIFFERENTIAL (CANCER CENTER ONLY)
Abs Immature Granulocytes: 0.25 K/uL — ABNORMAL HIGH (ref 0.00–0.07)
Basophils Absolute: 0 K/uL (ref 0.0–0.1)
Basophils Relative: 1 %
Eosinophils Absolute: 0 K/uL (ref 0.0–0.5)
Eosinophils Relative: 1 %
HCT: 32.9 % — ABNORMAL LOW (ref 39.0–52.0)
Hemoglobin: 11.4 g/dL — ABNORMAL LOW (ref 13.0–17.0)
Immature Granulocytes: 5 %
Lymphocytes Relative: 37 %
Lymphs Abs: 2 K/uL (ref 0.7–4.0)
MCH: 31.5 pg (ref 26.0–34.0)
MCHC: 34.7 g/dL (ref 30.0–36.0)
MCV: 90.9 fL (ref 80.0–100.0)
Monocytes Absolute: 0.9 K/uL (ref 0.1–1.0)
Monocytes Relative: 16 %
Neutro Abs: 2.3 K/uL (ref 1.7–7.7)
Neutrophils Relative %: 40 %
Platelet Count: 266 K/uL (ref 150–400)
RBC: 3.62 MIL/uL — ABNORMAL LOW (ref 4.22–5.81)
RDW: 14.4 % (ref 11.5–15.5)
WBC Count: 5.5 K/uL (ref 4.0–10.5)
nRBC: 0 % (ref 0.0–0.2)

## 2023-07-20 LAB — MAGNESIUM: Magnesium: 1.6 mg/dL — ABNORMAL LOW (ref 1.7–2.4)

## 2023-07-20 MED ORDER — HEPARIN SOD (PORK) LOCK FLUSH 100 UNIT/ML IV SOLN
500.0000 [IU] | Freq: Once | INTRAVENOUS | Status: AC | PRN
  Administered 2023-07-20: 500 [IU]

## 2023-07-20 MED ORDER — SODIUM CHLORIDE 0.9 % IV SOLN
150.0000 mg | Freq: Once | INTRAVENOUS | Status: AC
  Administered 2023-07-20: 150 mg via INTRAVENOUS
  Filled 2023-07-20: qty 150

## 2023-07-20 MED ORDER — SODIUM CHLORIDE 0.9 % IV SOLN: Freq: Once | INTRAVENOUS | Status: AC

## 2023-07-20 MED ORDER — DEXAMETHASONE SODIUM PHOSPHATE 10 MG/ML IJ SOLN
5.0000 mg | Freq: Once | INTRAMUSCULAR | Status: AC
  Administered 2023-07-20: 5 mg via INTRAVENOUS
  Filled 2023-07-20: qty 1

## 2023-07-20 MED ORDER — SODIUM CHLORIDE 0.9 % IV SOLN
5.0000 mg | Freq: Once | INTRAVENOUS | Status: DC
Start: 2023-07-20 — End: 2023-07-20

## 2023-07-20 MED ORDER — SODIUM CHLORIDE 0.9% FLUSH
10.0000 mL | INTRAVENOUS | Status: DC | PRN
  Administered 2023-07-20: 10 mL

## 2023-07-20 MED ORDER — SODIUM CHLORIDE 0.9 % IV SOLN
1000.0000 mg/m2 | Freq: Once | INTRAVENOUS | Status: AC
  Administered 2023-07-20: 2204 mg via INTRAVENOUS
  Filled 2023-07-20: qty 57.97

## 2023-07-20 MED ORDER — MAGNESIUM SULFATE 2 GM/50ML IV SOLN
2.0000 g | Freq: Once | INTRAVENOUS | Status: AC
  Administered 2023-07-20: 2 g via INTRAVENOUS
  Filled 2023-07-20: qty 50

## 2023-07-20 MED ORDER — SODIUM CHLORIDE 0.9 % IV SOLN
70.0000 mg/m2 | Freq: Once | INTRAVENOUS | Status: AC
  Administered 2023-07-20: 150 mg via INTRAVENOUS
  Filled 2023-07-20: qty 150

## 2023-07-20 MED ORDER — PALONOSETRON HCL INJECTION 0.25 MG/5ML
0.2500 mg | Freq: Once | INTRAVENOUS | Status: AC
  Administered 2023-07-20: 0.25 mg via INTRAVENOUS
  Filled 2023-07-20: qty 5

## 2023-07-20 MED ORDER — POTASSIUM CHLORIDE IN NACL 20-0.9 MEQ/L-% IV SOLN
Freq: Once | INTRAVENOUS | Status: AC
  Filled 2023-07-20: qty 1000

## 2023-07-20 NOTE — Progress Notes (Signed)
Per Mosetta Putt, MD, okay to run post hydration with Cisplatin.

## 2023-07-20 NOTE — Patient Instructions (Signed)
[place] [facility]  Discharge Instructions: Thank you for choosing [facility] to provide your oncology and hematology care.   If you have a lab appointment with the Cancer Center, please go directly to the Cancer Center and check in at the registration area.   Wear comfortable clothing and clothing appropriate for easy access to any Portacath or PICC line.   We strive to give you quality time with your provider. You may need to reschedule your appointment if you arrive late (15 or more minutes).  Arriving late affects you and other patients whose appointments are after yours.  Also, if you miss three or more appointments without notifying the office, you may be dismissed from the clinic at the provider's discretion.      For prescription refill requests, have your pharmacy contact our office and allow 72 hours for refills to be completed.    Today you received the following chemotherapy and/or immunotherapy agents Gemzar, Cisplatin.      To help prevent nausea and vomiting after your treatment, we encourage you to take your nausea medication as directed.  BELOW ARE SYMPTOMS THAT SHOULD BE REPORTED IMMEDIATELY: *FEVER GREATER THAN 100.4 F (38 C) OR HIGHER *CHILLS OR SWEATING *NAUSEA AND VOMITING THAT IS NOT CONTROLLED WITH YOUR NAUSEA MEDICATION *UNUSUAL SHORTNESS OF BREATH *UNUSUAL BRUISING OR BLEEDING *URINARY PROBLEMS (pain or burning when urinating, or frequent urination) *BOWEL PROBLEMS (unusual diarrhea, constipation, pain near the anus) TENDERNESS IN MOUTH AND THROAT WITH OR WITHOUT PRESENCE OF ULCERS (sore throat, sores in mouth, or a toothache) UNUSUAL RASH, SWELLING OR PAIN  UNUSUAL VAGINAL DISCHARGE OR ITCHING   Items with * indicate a potential emergency and should be followed up as soon as possible or go to the Emergency Department if any problems should occur.  Please show the CHEMOTHERAPY ALERT CARD or IMMUNOTHERAPY ALERT  CARD at check-in to the Emergency Department and triage nurse.  Should you have questions after your visit or need to cancel or reschedule your appointment, please contact [place] [facility]  Dept: [phone]  and follow the prompts.  Office hours are 8:00 a.m. to 4:30 p.m. Monday - Friday. Please note that voicemails left after 4:00 p.m. may not be returned until the following business day.  We are closed weekends and major holidays. You have access to a nurse at all times for urgent questions. Please call the main number to the clinic Dept: [phone] and follow the prompts.   For any non-urgent questions, you may also contact your provider using MyChart. We now offer e-Visits for anyone 17 and older to request care online for non-urgent symptoms. For details visit [email or web address].   Also download the MyChart app! Go to the app store, search "MyChart", open the app, select , and log in with your MyChart username and password.

## 2023-07-20 NOTE — Progress Notes (Signed)
[facility]   [phone] [phone]   Clinic Follow up Note   Patient Care Team: Plotnikov, [name], MD as PCP - General (Internal Medicine) Nahser, Deloris Ping, MD as PCP - Cardiology (Cardiology) Verner Chol, [facility] (Inactive) as Pharmacist (Pharmacist) Carlus Pavlov, MD as Consulting Physician (Internal Medicine) Jerilee Field, MD as Consulting Physician (Urology) Iva Boop, MD as Consulting Physician (Gastroenterology) Janet Berlin, MD as Consulting Physician (Ophthalmology) Malachy Mood, MD as Attending Physician (Hematology and Oncology)  Date of Service:  07/20/2023  CHIEF COMPLAINT: f/u of bladder cancer  CURRENT THERAPY:  Neoadjuvant chemotherapy cisplatin and gemcitabine  Oncology History   Bladder cancer 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 -He is clinically doing well, asymptomatic.  Surgical resection was discussed with patient, but he would like to try nonsurgical options. -He started [facility] on 01/12/23, stopped in June 2024 due to disease progression. 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 -I recommended neoadjuvant chemotherapy with cisplatin and gemcitabine for 4 cycles, followed by total cystectomy and prostatectomy. -He started chemotherapy on June 08, 2023.  He tolerated first dose overall  well.   Assessment and Plan    Bladder Cancer Undergoing neoadjuvant chemotherapy. Tolerating treatment well with mild fatigue. No neuropathy or ototoxicity. -Continue with chemotherapy as planned. -Plan for imaging after completion of 4 cycles to assess response.  Urinary Frequency Likely secondary to bladder cancer. Oxybutynin and Flomax providing partial relief. -Recommend follow-up with urologist for further management.  Diabetes Mellitus Last A1C 6.5. Morning sugars occasionally elevated. -Continue Metformin and Ozempic. -Monitor blood sugars closely, especially post-chemotherapy due to steroid administration.  Hypomagnesemia Possibly secondary to cisplatin chemotherapy. -Check magnesium level today. -If low, will prescribe oral magnesium supplementation.  Surgical Planning Patient scheduled to see surgeon on 09/12/2023. Desires to schedule surgery 3-4 weeks post last chemotherapy cycle. -Advise patient to contact surgeon's office to schedule earlier appointment. -Plan to see patient back in 3 weeks on 08/10/2023 for last cycle chemo         SUMMARY OF ONCOLOGIC HISTORY: Oncology History Overview Note   Cancer Staging  Bladder cancer [facility]) Staging form: Urinary Bladder, AJCC 8th Edition - Clinical stage from 05/14/2021: Stage 0a (cTa, cN0, cM0) - Signed by Malachy Mood, MD on 01/02/2023 Stage prefix: Initial diagnosis WHO/ISUP grade (low/high): High Grade Histologic grading system: 2 grade system - Pathologic stage from 02/22/2022: Stage I (pT1, pN0, cM0) - Signed by Malachy Mood, MD on 01/02/2023 WHO/ISUP grade (low/high): High Grade Histologic grading system: 2 grade system     Bladder cancer (HCC)  05/14/2021 Cancer Staging   Staging form: Urinary Bladder, AJCC 8th Edition - Clinical stage from 05/14/2021: Stage 0a (cTa, cN0, cM0) - Signed by Malachy Mood, MD on 01/02/2023 Stage prefix: Initial diagnosis WHO/ISUP grade (low/high): High Grade Histologic grading system: 2  grade system   01/31/2022 Initial Diagnosis   Bladder cancer (HCC)   02/22/2022 Cancer Staging   Staging form:  Urinary Bladder, AJCC 8th Edition - Pathologic stage from 02/22/2022: Stage I (pT1, pN0, cM0) - Signed by Malachy Mood, MD on 01/02/2023 WHO/ISUP grade (low/high): High Grade Histologic grading system: 2 grade system   01/12/2023 - 04/06/2023 Chemotherapy   Patient is on Treatment Plan : BLADDER Pembrolizumab (200) q21d     05/05/2023 Pathology Results     FINAL MICROSCOPIC DIAGNOSIS:   A. BLADDER, RIGHT POSTERIOR, TURBT:  Noninvasive high grade papillary urothelial carcinoma  Muscularis propria (detrusor muscle) is not present   B. BLADDER NECK AND PROSTATIC URETHRA, BIOPSY:  High grade urothelial carcinoma  The carcinoma focally invades muscularis propria (detrusor muscle)     05/05/2023 Cancer Staging   Staging form: Urinary Bladder, AJCC 8th Edition - Clinical stage from 05/05/2023: Stage II (cT2, cN0, cM0) - Signed by Malachy Mood, MD on 06/15/2023 WHO/ISUP grade (low/high): High Grade Histologic grading system: 2 grade system   06/01/2023 Imaging   CT Chest, Abdomen, and Pelvis with contrast  IMPRESSION: 1. Similar irregular asymmetric wall thickening along the posterior urinary bladder with progressive irregular thickening of the right ureter extending to the level of the renal pelvis where there is now a masslike 16 x 12 mm soft tissue focus, suspicious for direct ureteral extension of disease into the renal pelvis. 2. Irregular asymmetric wall thickening along the posterior urinary bladder measuring 14 mm is similar prior. 3. Right double-J ureteral stent is in place with persistent perinephric/periureteric stranding without significant hydronephrosis. 4. No convincing evidence of metastatic disease in the chest, abdomen or pelvis 5. Pancolonic diverticulosis without findings of acute diverticulitis. 6. Aortic Atherosclerosis (ICD10-I70.0) and Emphysema (ICD10-J43.9).    06/09/2023 -  Chemotherapy   Patient is on Treatment Plan : BLADDER Cisplatin D1 + Gemcitabine D1,8 q21d x 4 Cycles        Discussed the use of AI scribe software for clinical note transcription with the patient, who gave verbal consent to proceed.  History of Present Illness   The patient, a 72 year old gentleman with bladder cancer, presents for a follow-up visit during his course of neoadjuvant chemotherapy. He reports tolerating the treatment well, with fatigue being the primary side effect. The fatigue typically manifests four to five days post-treatment and can be alleviated with a couple of hours of rest. He denies experiencing any numbness or tingling. He reports occasional tinnitus, which has been a long-standing issue and is not worsening.  The patient also reports ongoing bladder spasms, resulting in frequent urination, particularly at night, every two to three hours. Despite taking oxybutynin and Flomax, the patient still experiences urgency, particularly after periods of sitting. He also reports having to walk around for a bit to initiate urination.  The patient has gained weight since the last visit and reports eating better, though not significantly more. He has a history of diabetes, which is managed with metformin and Ozempic. His last A1C was 6.5 about a month ago, and his morning glucose readings are occasionally as high as 150.         All other systems were reviewed with the patient and are negative.  MEDICAL HISTORY:  Past Medical History:  Diagnosis Date   Anticoagulant long-term use    eliquis--- managed by cardiology   Arthritis    Bladder cancer [facility])    urologist--- dr Mena Goes;  overlapping   History of basal cell carcinoma (BCC) excision    History of COVID-19 05/2019   per pt mild symptoms that resolved   HLD (hyperlipidemia)  HTN (hypertension)    Hx of colonic polyps    Insulin dependent type 2 diabetes mellitus [facility])    endocrinologist--- dr  Elvera Lennox --  (03-11-2022 per pt check blood sugar 1-2 times daily,  fasting sugar-- 118-120s)   Nocturia    Paroxysmal atrial fibrillation (HCC) 03/12/2019   cardiologist--- dr Melburn Popper   Ureterocele    Use of cane as ambulatory aid     SURGICAL HISTORY: Past Surgical History:  Procedure Laterality Date   BLEPHAROPLASTY Bilateral    per pt approx  2001;   upper eyelid's   COLONOSCOPY     last one approx 2023   CYSTOSCOPY WITH URETEROSCOPY AND STENT PLACEMENT Right 12/02/2022   Procedure: CYSTOSCOPY WITH RIGHT URETEROSCOPY, RIGHT DIAGNOSTIC URETEROSCOPY;  Surgeon: Jerilee Field, MD;  Location: [facility];  Service: Urology;  Laterality: Right;  90 MINS FOR CASE   CYSTOSCOPY/RETROGRADE/URETEROSCOPY Bilateral 05/05/2023   Procedure: CYSTOSCOPY BILATERAL RETROGRADE PYELOGRAM RIGHT URETEROSCOPY RIGHT STENT PLACEMENT;  Surgeon: Jerilee Field, MD;  Location: [facility];  Service: Urology;  Laterality: Bilateral;  90 MINS FOR CASE   IR IMAGING GUIDED PORT INSERTION  05/30/2023   PATELLAR TENDON REPAIR Left 04/16/2004   @[facility]    TRANSURETHRAL RESECTION OF BLADDER TUMOR N/A 06/18/2021   Procedure: TRANSURETHRAL RESECTION OF BLADDER TUMOR (TURBT) WITH RIGHT URETERAL STENT PLACEMENT, RIGHT URETEROSCOPY WITH DISTRACTION OF TUMOR, FULGURATION;  Surgeon: Jerilee Field, MD;  Location: [facility];  Service: Urology;  Laterality: N/A;   TRANSURETHRAL RESECTION OF BLADDER TUMOR N/A 03/15/2022   Procedure: TRANSURETHRAL RESECTION OF BLADDER TUMOR (TURBT) WITH BILATERAL URETEROSCOPY/URETHRAL/ DILATION/BILATERAL RETROGRADE PYELOGRAM/BIOPSY AND FULGURATION OF RIGHT URETERAL CANCER/RIGHT STENT PLACEMENT;  Surgeon: Jerilee Field, MD;  Location: [facility];  Service: Urology;  Laterality: N/A;   TRANSURETHRAL RESECTION OF BLADDER TUMOR N/A 05/05/2023   Procedure: TRANSURETHRAL RESECTION OF BLADDER TUMOR (TURBT) BLADDER BIOSPY;  Surgeon: Jerilee Field, MD;   Location: [facility];  Service: Urology;  Laterality: N/A;   TRANSURETHRAL RESECTION OF BLADDER TUMOR WITH MITOMYCIN-C N/A 12/02/2022   Procedure: TRANSURETHRAL RESECTION OF BLADDER TUMOR;  Surgeon: Jerilee Field, MD;  Location: [facility];  Service: Urology;  Laterality: N/A;   TRANSURETHRAL RESECTION OF PROSTATE  05/05/2023   Procedure: TRANSURETHRAL RESECTION OF THE PROSTATE (TURP);  Surgeon: Jerilee Field, MD;  Location: [facility];  Service: Urology;;    I have reviewed the social history and family history with the patient and they are unchanged from previous note.  ALLERGIES:  has No Known Allergies.  MEDICATIONS:  Current Outpatient Medications  Medication Sig Dispense Refill   acetaminophen (TYLENOL) 500 MG tablet Take 1,000 mg by mouth every 6 (six) hours as needed.     atorvastatin (LIPITOR) 40 MG tablet TAKE ONE TABLET BY MOUTH ONCE DAILY (Patient taking differently: at bedtime.) 90 tablet 1   B Complex-C (B-COMPLEX WITH VITAMIN C) tablet Take 1 tablet by mouth daily.     Cholecalciferol (VITAMIN D-3 PO) Take by mouth daily.     ELIQUIS 5 MG TABS tablet TAKE ONE TABLET BY MOUTH TWICE DAILY 60 tablet 5   fluticasone (FLONASE) 50 MCG/ACT nasal spray Place 1 spray into both nostrils daily as needed for allergies or rhinitis.     glipiZIDE (GLUCOTROL) 5 MG tablet TAKE 1/2 TABLET BY MOUTH EVERY EVENING 45 tablet 3   glucose blood test strip 1 each by Other route daily. Use daily for glucose control - Dx 250.00 100 each 0   lidocaine-prilocaine (  EMLA) cream Apply to affected area once (Patient not taking: Reported on 07/03/2023) 30 g 3   lisinopril (ZESTRIL) 40 MG tablet Take 1 tablet (40 mg total) by mouth daily. 90 tablet 3   metFORMIN (GLUCOPHAGE-XR) 500 MG 24 hr tablet Take 4 tablets (2,000 mg total) by mouth daily with supper. 360 tablet 3   metoprolol succinate (TOPROL-XL) 25 MG 24 hr tablet TAKE ONE TABLET BY MOUTH ONCE DAILY (Patient taking  differently: at bedtime.) 90 tablet 3   Multiple Vitamins-Minerals (EMERGEN-C VITAMIN C) PACK Take 1 packet by mouth daily as needed (immune support).     ondansetron (ZOFRAN) 8 MG tablet Take 1 tablet (8 mg total) by mouth every 8 (eight) hours as needed for nausea or vomiting. Start on the third day after cisplatin. (Patient not taking: Reported on 07/03/2023) 30 tablet 1   oxybutynin (DITROPAN XL) 10 MG 24 hr tablet Take 1 tablet (10 mg total) by mouth at bedtime. 30 tablet 0   prochlorperazine (COMPAZINE) 10 MG tablet Take 1 tablet (10 mg total) by mouth every 6 (six) hours as needed (Nausea or vomiting). (Patient not taking: Reported on 07/03/2023) 30 tablet 1   propranolol (INDERAL) 10 MG tablet TAKE 1 TABLET BY MOUTH 4 TIMES DAILY AS NEEDED FOR PALPITATIONS, IRREGULAR OR FAST HEART RATE 270 tablet 1   Semaglutide, 1 MG/DOSE, (OZEMPIC, 1 MG/DOSE,) 4 MG/3ML SOPN INJECT 1 MG ONCE A WEEK AS DIRECTED 6 mL 0   Sesame Oil OIL Sesame Oil (organic) ([employer] is a good brand)  1 TBSP twice daily. Sesame oil may help improve platelet counts; can take orally and also rub into dry skin areas such as the feet. Can add to salads as a dressing with oil/vinegar or dip with bread. Or take straight. Make sure it is cold pressed or expeller pressed. Make sure it is NOT the toasted sesame oil     tadalafil (CIALIS) 5 MG tablet Take 1 tablet (5 mg total) by mouth daily. 30 tablet 11   tamsulosin (FLOMAX) 0.4 MG CAPS capsule Take 0.4 mg by mouth at bedtime.     traMADol (ULTRAM) 50 MG tablet Take 1 tablet (50 mg total) by mouth every 6 (six) hours as needed for severe pain. 60 tablet 2   TURMERIC PO Curcumin Phytosome by Jacinto Reap  2 capsules twice daily to reduce inflammation, help with immune function; also has cancer prevention properties     No current facility-administered medications for this visit.    PHYSICAL EXAMINATION: ECOG PERFORMANCE STATUS: 1 - Symptomatic but completely ambulatory  Vitals:    07/20/23 0830  BP: 122/61  Pulse: 81  Resp: 17  Temp: 98.8 F (37.1 C)  SpO2: 98%   Wt Readings from Last 3 Encounters:  07/20/23 206 lb 1.6 oz (93.5 kg)  07/06/23 198 lb 4 oz (89.9 kg)  07/03/23 201 lb (91.2 kg)     GENERAL:alert, no distress and comfortable SKIN: skin color, texture, turgor are normal, no rashes or significant lesions EYES: normal, Conjunctiva are pink and non-injected, sclera clear NECK: supple, thyroid normal size, non-tender, without nodularity LYMPH:  no palpable lymphadenopathy in the cervical, axillary  LUNGS: clear to auscultation and percussion with normal breathing effort HEART: regular rate & rhythm and no murmurs and no lower extremity edema ABDOMEN:abdomen soft, non-tender and normal bowel sounds Musculoskeletal:no cyanosis of digits and no clubbing  NEURO: alert & oriented x 3 with fluent speech, no focal motor/sensory deficits      LABORATORY DATA:  I have reviewed the data as listed    Latest Ref Rng & Units 07/20/2023    8:01 AM 07/06/2023    1:14 PM 06/29/2023    8:08 AM  CBC  WBC 4.0 - 10.5 K/uL 5.5  3.9  5.4   Hemoglobin 13.0 - 17.0 g/dL 40.9  81.1  91.4   Hematocrit 39.0 - 52.0 % 32.9  33.9  34.9   Platelets 150 - 400 K/uL 266  243  458         Latest Ref Rng & Units 07/06/2023    1:14 PM 06/29/2023    8:08 AM 06/15/2023   12:46 PM  CMP  Glucose 70 - 99 mg/dL 782  956  213   BUN 8 - 23 mg/dL 22  18  29    Creatinine 0.61 - 1.24 mg/dL 0.86  5.78  4.69   Sodium 135 - 145 mmol/L 134  133  132   Potassium 3.5 - 5.1 mmol/L 4.4  4.4  3.9   Chloride 98 - 111 mmol/L 101  101  97   CO2 22 - 32 mmol/L 27  25  26    Calcium 8.9 - 10.3 mg/dL 9.1  9.0  9.5   Total Protein 6.5 - 8.1 g/dL 6.5  6.3  6.7   Total Bilirubin 0.3 - 1.2 mg/dL 0.5  0.2  0.7   Alkaline Phos 38 - 126 U/L 68  75  72   AST 15 - 41 U/L 15  11  14    ALT 0 - 44 U/L 21  11  21        RADIOGRAPHIC STUDIES: I have personally reviewed the radiological images as listed and  agreed with the findings in the report. No results found.    No orders of the defined types were placed in this encounter.  All questions were answered. The patient knows to call the clinic with any problems, questions or concerns. No barriers to learning was detected. The total time spent in the appointment was 25 minutes.     Malachy Mood, MD 07/20/2023

## 2023-07-21 ENCOUNTER — Other Ambulatory Visit: Payer: Self-pay | Admitting: Hematology

## 2023-07-27 ENCOUNTER — Inpatient Hospital Stay: Payer: Medicare HMO | Attending: Hematology

## 2023-07-27 ENCOUNTER — Inpatient Hospital Stay: Payer: Medicare HMO

## 2023-07-27 ENCOUNTER — Other Ambulatory Visit: Payer: Self-pay | Admitting: Hematology

## 2023-07-27 ENCOUNTER — Other Ambulatory Visit: Payer: Self-pay

## 2023-07-27 VITALS — BP 124/73 | HR 68 | Temp 97.9°F | Resp 18 | Ht 75.0 in | Wt 206.1 lb

## 2023-07-27 DIAGNOSIS — C679 Malignant neoplasm of bladder, unspecified: Secondary | ICD-10-CM

## 2023-07-27 DIAGNOSIS — G629 Polyneuropathy, unspecified: Secondary | ICD-10-CM | POA: Insufficient documentation

## 2023-07-27 DIAGNOSIS — Z95828 Presence of other vascular implants and grafts: Secondary | ICD-10-CM

## 2023-07-27 DIAGNOSIS — Z23 Encounter for immunization: Secondary | ICD-10-CM | POA: Insufficient documentation

## 2023-07-27 DIAGNOSIS — Z5111 Encounter for antineoplastic chemotherapy: Secondary | ICD-10-CM | POA: Insufficient documentation

## 2023-07-27 DIAGNOSIS — C675 Malignant neoplasm of bladder neck: Secondary | ICD-10-CM | POA: Insufficient documentation

## 2023-07-27 LAB — CMP (CANCER CENTER ONLY)
ALT: 16 U/L (ref 0–44)
AST: 14 U/L — ABNORMAL LOW (ref 15–41)
Albumin: 4 g/dL (ref 3.5–5.0)
Alkaline Phosphatase: 76 U/L (ref 38–126)
Anion gap: 7 (ref 5–15)
BUN: 15 mg/dL (ref 8–23)
CO2: 26 mmol/L (ref 22–32)
Calcium: 9.4 mg/dL (ref 8.9–10.3)
Chloride: 99 mmol/L (ref 98–111)
Creatinine: 0.87 mg/dL (ref 0.61–1.24)
GFR, Estimated: >60 mL/min (ref >60)
Glucose, Bld: 171 mg/dL — ABNORMAL HIGH (ref 70–99)
Potassium: 4 mmol/L (ref 3.5–5.1)
Sodium: 132 mmol/L — ABNORMAL LOW (ref 135–145)
Total Bilirubin: 0.4 mg/dL (ref 0.3–1.2)
Total Protein: 6.8 g/dL (ref 6.5–8.1)

## 2023-07-27 LAB — CBC WITH DIFFERENTIAL (CANCER CENTER ONLY)
Abs Immature Granulocytes: 0.04 10*3/uL (ref 0.00–0.07)
Basophils Absolute: 0.1 10*3/uL (ref 0.0–0.1)
Basophils Relative: 1 %
Eosinophils Absolute: 0 10*3/uL (ref 0.0–0.5)
Eosinophils Relative: 0 %
HCT: 30.3 % — ABNORMAL LOW (ref 39.0–52.0)
Hemoglobin: 10.9 g/dL — ABNORMAL LOW (ref 13.0–17.0)
Immature Granulocytes: 1 %
Lymphocytes Relative: 54 %
Lymphs Abs: 2 10*3/uL (ref 0.7–4.0)
MCH: 32.3 pg (ref 26.0–34.0)
MCHC: 36 g/dL (ref 30.0–36.0)
MCV: 89.9 fL (ref 80.0–100.0)
Monocytes Absolute: 0.3 10*3/uL (ref 0.1–1.0)
Monocytes Relative: 7 %
Neutro Abs: 1.4 10*3/uL — ABNORMAL LOW (ref 1.7–7.7)
Neutrophils Relative %: 37 %
Platelet Count: 166 10*3/uL (ref 150–400)
RBC: 3.37 MIL/uL — ABNORMAL LOW (ref 4.22–5.81)
RDW: 14.5 % (ref 11.5–15.5)
WBC Count: 3.7 10*3/uL — ABNORMAL LOW (ref 4.0–10.5)
nRBC: 0 % (ref 0.0–0.2)

## 2023-07-27 MED ORDER — PROCHLORPERAZINE MALEATE 10 MG PO TABS
10.0000 mg | ORAL_TABLET | Freq: Once | ORAL | Status: AC
  Administered 2023-07-27: 10 mg via ORAL
  Filled 2023-07-27: qty 1

## 2023-07-27 MED ORDER — SODIUM CHLORIDE 0.9 % IV SOLN
1000.0000 mg/m2 | Freq: Once | INTRAVENOUS | Status: AC
  Administered 2023-07-27: 2204 mg via INTRAVENOUS
  Filled 2023-07-27: qty 57.97

## 2023-07-27 MED ORDER — HEPARIN SOD (PORK) LOCK FLUSH 100 UNIT/ML IV SOLN
500.0000 [IU] | Freq: Once | INTRAVENOUS | Status: AC | PRN
  Administered 2023-07-27: 500 [IU]

## 2023-07-27 MED ORDER — SODIUM CHLORIDE 0.9 % IV SOLN: Freq: Once | INTRAVENOUS | Status: AC

## 2023-07-27 MED ORDER — SODIUM CHLORIDE 0.9% FLUSH
10.0000 mL | INTRAVENOUS | Status: DC | PRN
  Administered 2023-07-27: 10 mL

## 2023-07-27 NOTE — Patient Instructions (Signed)
[facility]  Discharge Instructions: Thank you for choosing [facility] to provide your oncology and hematology care.   If you have a lab appointment with the [facility], please go directly to the [facility] and check in at the registration area.   Wear comfortable clothing and clothing appropriate for easy access to any Portacath or PICC line.   We strive to give you quality time with your provider. You may need to reschedule your appointment if you arrive late (15 or more minutes).  Arriving late affects you and other patients whose appointments are after yours.  Also, if you miss three or more appointments without notifying the office, you may be dismissed from the clinic at the provider's discretion.      For prescription refill requests, have your pharmacy contact our office and allow 72 hours for refills to be completed.    Today you received the following chemotherapy and/or immunotherapy agents: Gemzar      To help prevent nausea and vomiting after your treatment, we encourage you to take your nausea medication as directed.  BELOW ARE SYMPTOMS THAT SHOULD BE REPORTED IMMEDIATELY: *FEVER GREATER THAN 100.4 F (38 C) OR HIGHER *CHILLS OR SWEATING *NAUSEA AND VOMITING THAT IS NOT CONTROLLED WITH YOUR NAUSEA MEDICATION *UNUSUAL SHORTNESS OF BREATH *UNUSUAL BRUISING OR BLEEDING *URINARY PROBLEMS (pain or burning when urinating, or frequent urination) *BOWEL PROBLEMS (unusual diarrhea, constipation, pain near the anus) TENDERNESS IN MOUTH AND THROAT WITH OR WITHOUT PRESENCE OF ULCERS (sore throat, sores in mouth, or a toothache) UNUSUAL RASH, SWELLING OR PAIN  UNUSUAL VAGINAL DISCHARGE OR ITCHING   Items with * indicate a potential emergency and should be followed up as soon as possible or go to the Emergency Department if any problems should occur.  Please show the CHEMOTHERAPY ALERT CARD or IMMUNOTHERAPY ALERT CARD at check-in  to the Emergency Department and triage nurse.  Should you have questions after your visit or need to cancel or reschedule your appointment, please contact [facility]  Dept: [phone]  and follow the prompts.  Office hours are 8:00 a.m. to 4:30 p.m. Monday - Friday. Please note that voicemails left after 4:00 p.m. may not be returned until the following business day.  We are closed weekends and major holidays. You have access to a nurse at all times for urgent questions. Please call the main number to the clinic Dept: [phone] and follow the prompts.   For any non-urgent questions, you may also contact your provider using MyChart. We now offer e-Visits for anyone 40 and older to request care online for non-urgent symptoms. For details visit [email or web address].   Also download the MyChart app! Go to the app store, search "MyChart", open the app, select [facility], and log in with your MyChart username and password.

## 2023-07-27 NOTE — Progress Notes (Signed)
Patient's ANC is 1.4K/ul. Per Dr. Mosetta Putt ok to treat.

## 2023-08-01 ENCOUNTER — Encounter: Payer: Self-pay | Admitting: Internal Medicine

## 2023-08-02 ENCOUNTER — Encounter: Payer: Self-pay | Admitting: Hematology

## 2023-08-09 MED FILL — Fosaprepitant Dimeglumine For IV Infusion 150 MG (Base Eq): INTRAVENOUS | Qty: 5 | Status: AC

## 2023-08-09 NOTE — Progress Notes (Unsigned)
Patient Care Team: Plotnikov, [name], MD as PCP - General (Internal Medicine) Nahser, Deloris Ping, MD as PCP - Cardiology (Cardiology) Verner Chol, [facility] (Inactive) as Pharmacist (Pharmacist) Carlus Pavlov, MD as Consulting Physician (Internal Medicine) Jerilee Field, MD as Consulting Physician (Urology) Iva Boop, MD as Consulting Physician (Gastroenterology) Janet Berlin, MD as Consulting Physician (Ophthalmology) Malachy Mood, MD as Attending Physician (Hematology and Oncology)  Clinic Day:  08/10/2023  Referring physician: Malachy Mood, MD  ASSESSMENT & PLAN:   Assessment & Plan: Bladder cancer [facility]) -cT2N0M0 stage II diagnosed in 04/2023 -He has had 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 -He is clinically doing well, asymptomatic.  Surgical resection was discussed with patient, but he would like to try nonsurgical options. -He started [facility] on 01/12/23, stopped in June 2024 due to disease progression. 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 -I recommended neoadjuvant chemotherapy with cisplatin and gemcitabine for 4 cycles, followed by total cystectomy and prostatectomy. -He started chemotherapy on June 08, 2023.  He tolerated first dose overall well. -todeay, he presents for Cycle 4 day 1.    Plan; Labs reviewed  -CBC showing WBC 4.3; Hgb 9.7; Hct 28.1; Plt 206; Anc 1.7 -CMP - K 4.2; glucose 207; BUN 10;  Creatinine 0.87; eGFR >60; Ca 8.6; LFTs normal.   -magnesium - 1.7 Proceed with chemotherapy today, Cycle 4 day 1. Scheduled for Gemzar on 08/17/2023.  Flu shot administered during today's visit Due for new CT chest, abdomen, and pelvis for restaging prior to seeing surgeon.  Follow up after surgery He continues to see Dr. Mena Goes, urology, routinely.  Consultation with Dr. Berneice Heinrich, urologic surgeon is scheduled for 09/12/2023.   The patient understands the plans discussed today and is in agreement with them.  He knows to contact our office if he develops concerns prior to his next appointment.  I provided 30 minutes of face-to-face time during this encounter and > 50% was spent counseling as documented under my assessment and plan.    Carlean Jews, NP  [facility] [address] [place] [address] Dept: [phone] Dept Fax: [phone]    CHIEF COMPLAINT:  CC: bladder cancer   Current Treatment:  neoadjuvant cisplatin and gemcitabine.   INTERVAL HISTORY:  Robert [name] is here today for repeat clinical assessment. He denies fevers or chills. He denies pain. His appetite is good. His weight has increased 3 pounds over last 3 weeks . He is recievving neoadjuvant cisplatin and gemcitabine for bladder cancer. He reports tolerating the treatment well, with fatigue being the primary side effect. The fatigue typically manifests four to five days post-treatment. He states that fatigue is becoming more frequent and disruptive. It can be alleviated with a couple of hours of rest. He does have preexisting peripheral neuropathy. He denies worsening symptoms. The patient reports improving bladder spasms.  Denies chest pain or pressure. Denies shortness of breath and SOB with exertion. He is scheduled to meet with surgeon  on 09/12/2023.    I have reviewed the past medical history, past surgical history, social history and  family history with the patient and they are unchanged from previous note.  ALLERGIES:  has No Known Allergies.  MEDICATIONS:  Current Outpatient Medications  Medication Sig Dispense Refill   acetaminophen (TYLENOL) 500 MG tablet Take 1,000 mg by mouth every 6 (six) hours as needed.     atorvastatin (LIPITOR) 40 MG tablet TAKE ONE TABLET BY MOUTH ONCE DAILY (Patient taking differently: at bedtime.) 90 tablet 1   B Complex-C (B-COMPLEX WITH VITAMIN C) tablet Take 1 tablet by mouth daily.     Cholecalciferol (VITAMIN D-3 PO) Take by mouth daily.     ELIQUIS 5 MG TABS tablet TAKE ONE TABLET BY MOUTH TWICE DAILY 60 tablet 5   fluticasone (FLONASE) 50 MCG/ACT nasal spray Place 1 spray into both nostrils daily as needed for allergies or rhinitis.     glipiZIDE (GLUCOTROL) 5 MG tablet TAKE 1/2 TABLET BY MOUTH EVERY EVENING 45 tablet 3   glucose blood test strip 1 each by Other route daily. Use daily for glucose control - Dx 250.00 100 each 0   lidocaine-prilocaine (EMLA) cream Apply to affected area once 30 g 3   lisinopril (ZESTRIL) 40 MG tablet Take 1 tablet (40 mg total) by mouth daily. 90 tablet 3   metFORMIN (GLUCOPHAGE-XR) 500 MG 24 hr tablet Take 4 tablets (2,000 mg total) by mouth daily with supper. 360 tablet 3   metoprolol succinate (TOPROL-XL) 25 MG 24 hr tablet TAKE ONE TABLET BY MOUTH ONCE DAILY (Patient taking differently: at bedtime.) 90 tablet 3   Multiple Vitamins-Minerals (EMERGEN-C VITAMIN C) PACK Take 1 packet by mouth daily as needed (immune support).     ondansetron (ZOFRAN) 8 MG tablet Take 1 tablet (8 mg total) by mouth every 8 (eight) hours as needed for nausea or vomiting. Start on the third day after cisplatin. 30 tablet 1   oxybutynin (DITROPAN-XL) 10 MG 24 hr tablet TAKE 1 TABLET BY MOUTH EVERYDAY AT BEDTIME 90 tablet 1   prochlorperazine (COMPAZINE) 10 MG tablet Take 1 tablet (10 mg total) by mouth every 6 (six) hours as needed (Nausea or vomiting). 30 tablet 1    propranolol (INDERAL) 10 MG tablet TAKE 1 TABLET BY MOUTH 4 TIMES DAILY AS NEEDED FOR PALPITATIONS, IRREGULAR OR FAST HEART RATE 270 tablet 1   Semaglutide, 1 MG/DOSE, (OZEMPIC, 1 MG/DOSE,) 4 MG/3ML SOPN INJECT 1 MG UNDER THE SKIN ONCE A WEEK AS DIRECTED 9 mL 3   Sesame Oil OIL Sesame Oil (organic) ([employer] is a good brand)  1 TBSP twice daily. Sesame oil may help improve platelet counts; can take orally and also rub into dry skin areas such as the feet. Can add to salads as a dressing with oil/vinegar or dip with bread. Or take straight. Make sure it is cold pressed or expeller pressed. Make sure it is NOT the toasted sesame oil     tadalafil (CIALIS) 5 MG tablet Take 1 tablet (5 mg total) by mouth daily. 30 tablet 11   tamsulosin (FLOMAX) 0.4 MG CAPS capsule Take 0.4 mg by mouth at bedtime.     traMADol (ULTRAM) 50 MG tablet Take 1 tablet (50 mg total) by mouth every 6 (six) hours as needed for severe pain. 60 tablet 2   TURMERIC PO Curcumin Phytosome by Jacinto Reap  2 capsules twice daily to reduce inflammation, help with immune function; also has cancer prevention properties  No current facility-administered medications for this visit.    HISTORY OF PRESENT ILLNESS:   Oncology History Overview Note   Cancer Staging  Bladder cancer [facility]) Staging form: Urinary Bladder, AJCC 8th Edition - Clinical stage from 05/14/2021: Stage 0a (cTa, cN0, cM0) - Signed by Malachy Mood, MD on 01/02/2023 Stage prefix: Initial diagnosis WHO/ISUP grade (low/high): High Grade Histologic grading system: 2 grade system - Pathologic stage from 02/22/2022: Stage I (pT1, pN0, cM0) - Signed by Malachy Mood, MD on 01/02/2023 WHO/ISUP grade (low/high): High Grade Histologic grading system: 2 grade system     Bladder cancer (HCC)  05/14/2021 Cancer Staging   Staging form: Urinary Bladder, AJCC 8th Edition - Clinical stage from 05/14/2021: Stage 0a (cTa, cN0, cM0) - Signed by Malachy Mood, MD on 01/02/2023 Stage prefix:  Initial diagnosis WHO/ISUP grade (low/high): High Grade Histologic grading system: 2 grade system   01/31/2022 Initial Diagnosis   Bladder cancer (HCC)   02/22/2022 Cancer Staging   Staging form: Urinary Bladder, AJCC 8th Edition - Pathologic stage from 02/22/2022: Stage I (pT1, pN0, cM0) - Signed by Malachy Mood, MD on 01/02/2023 WHO/ISUP grade (low/high): High Grade Histologic grading system: 2 grade system   01/12/2023 - 04/06/2023 Chemotherapy   Patient is on Treatment Plan : BLADDER Pembrolizumab (200) q21d     05/05/2023 Pathology Results     FINAL MICROSCOPIC DIAGNOSIS:   A. BLADDER, RIGHT POSTERIOR, TURBT:  Noninvasive high grade papillary urothelial carcinoma  Muscularis propria (detrusor muscle) is not present   B. BLADDER NECK AND PROSTATIC URETHRA, BIOPSY:  High grade urothelial carcinoma  The carcinoma focally invades muscularis propria (detrusor muscle)     05/05/2023 Cancer Staging   Staging form: Urinary Bladder, AJCC 8th Edition - Clinical stage from 05/05/2023: Stage II (cT2, cN0, cM0) - Signed by Malachy Mood, MD on 06/15/2023 WHO/ISUP grade (low/high): High Grade Histologic grading system: 2 grade system   06/01/2023 Imaging   CT Chest, Abdomen, and Pelvis with contrast  IMPRESSION: 1. Similar irregular asymmetric wall thickening along the posterior urinary bladder with progressive irregular thickening of the right ureter extending to the level of the renal pelvis where there is now a masslike 16 x 12 mm soft tissue focus, suspicious for direct ureteral extension of disease into the renal pelvis. 2. Irregular asymmetric wall thickening along the posterior urinary bladder measuring 14 mm is similar prior. 3. Right double-J ureteral stent is in place with persistent perinephric/periureteric stranding without significant hydronephrosis. 4. No convincing evidence of metastatic disease in the chest, abdomen or pelvis 5. Pancolonic diverticulosis without findings of acute  diverticulitis. 6. Aortic Atherosclerosis (ICD10-I70.0) and Emphysema (ICD10-J43.9).   06/09/2023 -  Chemotherapy   Patient is on Treatment Plan : BLADDER Cisplatin D1 + Gemcitabine D1,8 q21d x 4 Cycles         REVIEW OF SYSTEMS:   Constitutional: Denies fevers or weight loss. Reports intermittent chills. Has increased fatigue. Eyes: Denies blurriness of vision Ears, nose, mouth, throat, and face: Denies mucositis or sore throat Respiratory: Denies cough, dyspnea or wheezes Cardiovascular: Denies palpitation, chest discomfort or lower extremity swelling Gastrointestinal:  Denies nausea, heartburn or change in bowel habits Skin: Denies abnormal skin rashes Lymphatics: Denies new lymphadenopathy or easy bruising Neurological:Denies numbness, tingling or new weaknesses Behavioral/Psych: Mood is stable, no new changes  All other systems were reviewed with the patient and are negative.   VITALS:   Today's Vitals   08/10/23 0835 08/10/23 0838  BP:  (!) 140/76  Pulse:  65  Resp:  13  Temp:  97.7 F (36.5 C)  TempSrc:  Oral  SpO2:  100%  Weight:  209 lb 14.4 oz (95.2 kg)  PainSc: 5     Body mass index is 26.24 kg/m.    Wt Readings from Last 3 Encounters:  08/10/23 209 lb 14.4 oz (95.2 kg)  07/27/23 206 lb 1.3 oz (93.5 kg)  07/20/23 206 lb 1.6 oz (93.5 kg)    Body mass index is 26.24 kg/m.  Performance status (ECOG): 1 - Symptomatic but completely ambulatory  PHYSICAL EXAM:   GENERAL:alert, no distress and comfortable SKIN: skin color, texture, turgor are normal, no rashes or significant lesions EYES: normal, Conjunctiva are pink and non-injected, sclera clear OROPHARYNX:no exudate, no erythema and lips, buccal mucosa, and tongue normal  NECK: supple, thyroid normal size, non-tender, without nodularity LYMPH:  no palpable lymphadenopathy in the cervical, axillary or inguinal LUNGS: clear to auscultation and percussion with normal breathing effort HEART: regular rate  & rhythm and no murmurs and no lower extremity edema ABDOMEN:abdomen soft, non-tender and normal bowel sounds Musculoskeletal:no cyanosis of digits and no clubbing  NEURO: alert & oriented x 3 with fluent speech, no focal motor/sensory deficits  LABORATORY DATA:  I have reviewed the data as listed    Component Value Date/Time   NA 134 (L) 08/10/2023 0802   NA 138 10/21/2019 1138   K 4.2 08/10/2023 0802   CL 103 08/10/2023 0802   CO2 24 08/10/2023 0802   GLUCOSE 207 (H) 08/10/2023 0802   BUN 10 08/10/2023 0802   BUN 14 10/21/2019 1138   CREATININE 0.87 08/10/2023 0802   CALCIUM 8.6 (L) 08/10/2023 0802   PROT 6.7 08/10/2023 0802   ALBUMIN 3.9 08/10/2023 0802   AST 12 (L) 08/10/2023 0802   ALT 7 08/10/2023 0802   ALKPHOS 75 08/10/2023 0802   BILITOT 0.3 08/10/2023 0802   GFRNONAA >60 08/10/2023 0802   GFRAA 101 10/21/2019 1138     Lab Results  Component Value Date   WBC 4.3 08/10/2023   NEUTROABS 1.7 08/10/2023   HGB 9.7 (L) 08/10/2023   HCT 28.1 (L) 08/10/2023   MCV 95.6 08/10/2023   PLT 206 08/10/2023   Addendum I have seen the patient, examined him. I agree with the assessment and and plan and have edited the notes.   Mr. Zenon is doing well overall, tolerating chemotherapy decently well.  Lab reviewed, adequate for treatment, will proceed last cycle day 1 chemotherapy today, he will return next week for last dose chemo.  He is scheduled to see his urologist early next month.  Will discuss with the urologist about his restaging CT scan.  I plan to see him after surgery, or sooner if I order his restaging CT. I spent a total of 25 minutes for his visit today, more than 50% time on face-to-face counseling.  Malachy Mood MD 08/10/2023

## 2023-08-09 NOTE — Assessment & Plan Note (Signed)
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 -He is clinically doing well, asymptomatic.  Surgical resection was discussed with patient, but he would like to try nonsurgical options. -He started [facility] on 01/12/23, stopped in June 2024 due to disease progression. 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 -I recommended neoadjuvant chemotherapy with cisplatin and gemcitabine for 4 cycles, followed by total cystectomy and prostatectomy. -He started chemotherapy on June 08, 2023.  He tolerated first dose overall well. -todeay, he presents for Cycle 4 day 1.

## 2023-08-10 ENCOUNTER — Inpatient Hospital Stay: Payer: Medicare HMO

## 2023-08-10 ENCOUNTER — Inpatient Hospital Stay: Payer: Medicare HMO | Admitting: Nurse Practitioner

## 2023-08-10 ENCOUNTER — Other Ambulatory Visit: Payer: Self-pay | Admitting: Nurse Practitioner

## 2023-08-10 VITALS — BP 140/76 | HR 65 | Temp 97.7°F | Resp 13 | Wt 209.9 lb

## 2023-08-10 DIAGNOSIS — Z95828 Presence of other vascular implants and grafts: Secondary | ICD-10-CM

## 2023-08-10 DIAGNOSIS — C675 Malignant neoplasm of bladder neck: Secondary | ICD-10-CM | POA: Diagnosis not present

## 2023-08-10 DIAGNOSIS — C679 Malignant neoplasm of bladder, unspecified: Secondary | ICD-10-CM

## 2023-08-10 DIAGNOSIS — G629 Polyneuropathy, unspecified: Secondary | ICD-10-CM | POA: Diagnosis not present

## 2023-08-10 DIAGNOSIS — Z5111 Encounter for antineoplastic chemotherapy: Secondary | ICD-10-CM | POA: Diagnosis not present

## 2023-08-10 DIAGNOSIS — Z23 Encounter for immunization: Secondary | ICD-10-CM | POA: Diagnosis not present

## 2023-08-10 LAB — CBC WITH DIFFERENTIAL (CANCER CENTER ONLY)
Abs Immature Granulocytes: 0.14 10*3/uL — ABNORMAL HIGH (ref 0.00–0.07)
Basophils Absolute: 0 10*3/uL (ref 0.0–0.1)
Basophils Relative: 0 %
Eosinophils Absolute: 0 10*3/uL (ref 0.0–0.5)
Eosinophils Relative: 1 %
HCT: 28.1 % — ABNORMAL LOW (ref 39.0–52.0)
Hemoglobin: 9.7 g/dL — ABNORMAL LOW (ref 13.0–17.0)
Immature Granulocytes: 3 %
Lymphocytes Relative: 41 %
Lymphs Abs: 1.7 10*3/uL (ref 0.7–4.0)
MCH: 33 pg (ref 26.0–34.0)
MCHC: 34.5 g/dL (ref 30.0–36.0)
MCV: 95.6 fL (ref 80.0–100.0)
Monocytes Absolute: 0.7 10*3/uL (ref 0.1–1.0)
Monocytes Relative: 16 %
Neutro Abs: 1.7 10*3/uL (ref 1.7–7.7)
Neutrophils Relative %: 39 %
Platelet Count: 206 10*3/uL (ref 150–400)
RBC: 2.94 MIL/uL — ABNORMAL LOW (ref 4.22–5.81)
RDW: 18.5 % — ABNORMAL HIGH (ref 11.5–15.5)
WBC Count: 4.3 10*3/uL (ref 4.0–10.5)
nRBC: 0 % (ref 0.0–0.2)

## 2023-08-10 LAB — CMP (CANCER CENTER ONLY)
ALT: 7 U/L (ref 0–44)
AST: 12 U/L — ABNORMAL LOW (ref 15–41)
Albumin: 3.9 g/dL (ref 3.5–5.0)
Alkaline Phosphatase: 75 U/L (ref 38–126)
Anion gap: 7 (ref 5–15)
BUN: 10 mg/dL (ref 8–23)
CO2: 24 mmol/L (ref 22–32)
Calcium: 8.6 mg/dL — ABNORMAL LOW (ref 8.9–10.3)
Chloride: 103 mmol/L (ref 98–111)
Creatinine: 0.87 mg/dL (ref 0.61–1.24)
GFR, Estimated: >60 mL/min (ref >60)
Glucose, Bld: 207 mg/dL — ABNORMAL HIGH (ref 70–99)
Potassium: 4.2 mmol/L (ref 3.5–5.1)
Sodium: 134 mmol/L — ABNORMAL LOW (ref 135–145)
Total Bilirubin: 0.3 mg/dL (ref 0.3–1.2)
Total Protein: 6.7 g/dL (ref 6.5–8.1)

## 2023-08-10 LAB — MAGNESIUM: Magnesium: 1.7 mg/dL (ref 1.7–2.4)

## 2023-08-10 MED ORDER — DEXAMETHASONE SODIUM PHOSPHATE 10 MG/ML IJ SOLN
5.0000 mg | Freq: Once | INTRAMUSCULAR | Status: AC
  Administered 2023-08-10: 5 mg via INTRAVENOUS
  Filled 2023-08-10: qty 1

## 2023-08-10 MED ORDER — HEPARIN SOD (PORK) LOCK FLUSH 100 UNIT/ML IV SOLN
500.0000 [IU] | Freq: Once | INTRAVENOUS | Status: AC | PRN
  Administered 2023-08-10: 500 [IU]

## 2023-08-10 MED ORDER — INFLUENZA VAC A&B SURF ANT ADJ 0.5 ML IM SUSY
0.5000 mL | PREFILLED_SYRINGE | Freq: Once | INTRAMUSCULAR | Status: AC
  Administered 2023-08-10: 0.5 mL via INTRAMUSCULAR
  Filled 2023-08-10: qty 0.5

## 2023-08-10 MED ORDER — SODIUM CHLORIDE 0.9 % IV SOLN
70.0000 mg/m2 | Freq: Once | INTRAVENOUS | Status: AC
  Administered 2023-08-10: 150 mg via INTRAVENOUS
  Filled 2023-08-10: qty 150

## 2023-08-10 MED ORDER — SODIUM CHLORIDE 0.9 % IV SOLN
150.0000 mg | Freq: Once | INTRAVENOUS | Status: AC
  Administered 2023-08-10: 150 mg via INTRAVENOUS
  Filled 2023-08-10: qty 150

## 2023-08-10 MED ORDER — SODIUM CHLORIDE 0.9% FLUSH
10.0000 mL | INTRAVENOUS | Status: DC | PRN
  Administered 2023-08-10: 10 mL

## 2023-08-10 MED ORDER — SODIUM CHLORIDE 0.9 % IV SOLN: Freq: Once | INTRAVENOUS | Status: AC

## 2023-08-10 MED ORDER — SODIUM CHLORIDE 0.9 % IV SOLN
1000.0000 mg/m2 | Freq: Once | INTRAVENOUS | Status: AC
  Administered 2023-08-10: 2204 mg via INTRAVENOUS
  Filled 2023-08-10: qty 57.97

## 2023-08-10 MED ORDER — PALONOSETRON HCL INJECTION 0.25 MG/5ML
0.2500 mg | Freq: Once | INTRAVENOUS | Status: AC
  Administered 2023-08-10: 0.25 mg via INTRAVENOUS
  Filled 2023-08-10: qty 5

## 2023-08-10 MED ORDER — POTASSIUM CHLORIDE IN NACL 20-0.9 MEQ/L-% IV SOLN
Freq: Once | INTRAVENOUS | Status: AC
  Filled 2023-08-10: qty 1000

## 2023-08-10 MED ORDER — SODIUM CHLORIDE 0.9% FLUSH: 10.0000 mL | INTRAVENOUS | Status: DC | PRN

## 2023-08-10 MED ORDER — MAGNESIUM SULFATE 2 GM/50ML IV SOLN
2.0000 g | Freq: Once | INTRAVENOUS | Status: AC
  Administered 2023-08-10: 2 g via INTRAVENOUS
  Filled 2023-08-10: qty 50

## 2023-08-10 NOTE — Patient Instructions (Signed)
[facility]  Discharge Instructions:  Thank you for choosing [facility] to provide your oncology and hematology care.   If you have a lab appointment with the Cancer Center, please go directly to the Cancer Center and check in at the registration area.   Wear comfortable clothing and clothing appropriate for easy access to any Portacath or PICC line.   We strive to give you quality time with your provider. You may need to reschedule your appointment if you arrive late (15 or more minutes).  Arriving late affects you and other patients whose appointments are after yours.  Also, if you miss three or more appointments without notifying the office, you may be dismissed from the clinic at the provider's discretion.      For prescription refill requests, have your pharmacy contact our office and allow 72 hours for refills to be completed.    Today you received the following chemotherapy and/or immunotherapy agents :  Gemcitabine,  Cisplatin.    To help prevent nausea and vomiting after your treatment, we encourage you to take your nausea medication as directed.  BELOW ARE SYMPTOMS THAT SHOULD BE REPORTED IMMEDIATELY: *FEVER GREATER THAN 100.4 F (38 C) OR HIGHER *CHILLS OR SWEATING *NAUSEA AND VOMITING THAT IS NOT CONTROLLED WITH YOUR NAUSEA MEDICATION *UNUSUAL SHORTNESS OF BREATH *UNUSUAL BRUISING OR BLEEDING *URINARY PROBLEMS (pain or burning when urinating, or frequent urination) *BOWEL PROBLEMS (unusual diarrhea, constipation, pain near the anus) TENDERNESS IN MOUTH AND THROAT WITH OR WITHOUT PRESENCE OF ULCERS (sore throat, sores in mouth, or a toothache) UNUSUAL RASH, SWELLING OR PAIN  UNUSUAL VAGINAL DISCHARGE OR ITCHING   Items with * indicate a potential emergency and should be followed up as soon as possible or go to the Emergency Department if any problems should occur.  Please show the CHEMOTHERAPY ALERT CARD or IMMUNOTHERAPY  ALERT CARD at check-in to the Emergency Department and triage nurse.  Should you have questions after your visit or need to cancel or reschedule your appointment, please contact [place] [facility]  Dept: [phone]  and follow the prompts.  Office hours are 8:00 a.m. to 4:30 p.m. Monday - Friday. Please note that voicemails left after 4:00 p.m. may not be returned until the following business day.  We are closed weekends and major holidays. You have access to a nurse at all times for urgent questions. Please call the main number to the clinic Dept: [phone] and follow the prompts.   For any non-urgent questions, you may also contact your provider using MyChart. We now offer e-Visits for anyone 73 and older to request care online for non-urgent symptoms. For details visit [email or web address].   Also download the MyChart app! Go to the app store, search "MyChart", open the app, select [place], and log in with your MyChart username and password.

## 2023-08-10 NOTE — Progress Notes (Signed)
Per Dr. Mosetta Putt, OK to infuse post-hydration IVF with cisplatin today.

## 2023-08-10 NOTE — Patient Instructions (Signed)

## 2023-08-17 ENCOUNTER — Inpatient Hospital Stay: Payer: Medicare HMO

## 2023-08-17 VITALS — BP 138/77 | HR 75 | Temp 98.2°F | Resp 16

## 2023-08-17 DIAGNOSIS — Z5111 Encounter for antineoplastic chemotherapy: Secondary | ICD-10-CM | POA: Diagnosis not present

## 2023-08-17 DIAGNOSIS — C675 Malignant neoplasm of bladder neck: Secondary | ICD-10-CM | POA: Diagnosis not present

## 2023-08-17 DIAGNOSIS — Z23 Encounter for immunization: Secondary | ICD-10-CM | POA: Diagnosis not present

## 2023-08-17 DIAGNOSIS — G629 Polyneuropathy, unspecified: Secondary | ICD-10-CM | POA: Diagnosis not present

## 2023-08-17 DIAGNOSIS — C679 Malignant neoplasm of bladder, unspecified: Secondary | ICD-10-CM

## 2023-08-17 LAB — CMP (CANCER CENTER ONLY)
ALT: 14 U/L (ref 0–44)
AST: 13 U/L — ABNORMAL LOW (ref 15–41)
Albumin: 3.7 g/dL (ref 3.5–5.0)
Alkaline Phosphatase: 71 U/L (ref 38–126)
Anion gap: 8 (ref 5–15)
BUN: 15 mg/dL (ref 8–23)
CO2: 27 mmol/L (ref 22–32)
Calcium: 9.3 mg/dL (ref 8.9–10.3)
Chloride: 98 mmol/L (ref 98–111)
Creatinine: 0.83 mg/dL (ref 0.61–1.24)
GFR, Estimated: >60 mL/min (ref >60)
Glucose, Bld: 232 mg/dL — ABNORMAL HIGH (ref 70–99)
Potassium: 3.9 mmol/L (ref 3.5–5.1)
Sodium: 133 mmol/L — ABNORMAL LOW (ref 135–145)
Total Bilirubin: 0.4 mg/dL (ref 0.3–1.2)
Total Protein: 6.9 g/dL (ref 6.5–8.1)

## 2023-08-17 LAB — CBC WITH DIFFERENTIAL (CANCER CENTER ONLY)
Abs Immature Granulocytes: 0.02 10*3/uL (ref 0.00–0.07)
Basophils Absolute: 0 10*3/uL (ref 0.0–0.1)
Basophils Relative: 1 %
Eosinophils Absolute: 0 10*3/uL (ref 0.0–0.5)
Eosinophils Relative: 0 %
HCT: 25.3 % — ABNORMAL LOW (ref 39.0–52.0)
Hemoglobin: 9.3 g/dL — ABNORMAL LOW (ref 13.0–17.0)
Immature Granulocytes: 1 %
Lymphocytes Relative: 57 %
Lymphs Abs: 1.5 10*3/uL (ref 0.7–4.0)
MCH: 33.9 pg (ref 26.0–34.0)
MCHC: 36.8 g/dL — ABNORMAL HIGH (ref 30.0–36.0)
MCV: 92.3 fL (ref 80.0–100.0)
Monocytes Absolute: 0.1 10*3/uL (ref 0.1–1.0)
Monocytes Relative: 5 %
Neutro Abs: 0.9 10*3/uL — ABNORMAL LOW (ref 1.7–7.7)
Neutrophils Relative %: 36 %
Platelet Count: 156 10*3/uL (ref 150–400)
RBC: 2.74 MIL/uL — ABNORMAL LOW (ref 4.22–5.81)
RDW: 17.4 % — ABNORMAL HIGH (ref 11.5–15.5)
WBC Count: 2.6 10*3/uL — ABNORMAL LOW (ref 4.0–10.5)
nRBC: 0 % (ref 0.0–0.2)

## 2023-08-17 MED ORDER — PROCHLORPERAZINE MALEATE 10 MG PO TABS
10.0000 mg | ORAL_TABLET | Freq: Once | ORAL | Status: AC
  Administered 2023-08-17: 10 mg via ORAL
  Filled 2023-08-17: qty 1

## 2023-08-17 MED ORDER — SODIUM CHLORIDE 0.9% FLUSH: 10.0000 mL | INTRAVENOUS | Status: DC | PRN

## 2023-08-17 MED ORDER — SODIUM CHLORIDE 0.9 % IV SOLN
1000.0000 mg/m2 | Freq: Once | INTRAVENOUS | Status: AC
  Administered 2023-08-17: 2204 mg via INTRAVENOUS
  Filled 2023-08-17: qty 57.97

## 2023-08-17 MED ORDER — SODIUM CHLORIDE 0.9 % IV SOLN: Freq: Once | INTRAVENOUS | Status: AC

## 2023-08-17 MED ORDER — HEPARIN SOD (PORK) LOCK FLUSH 100 UNIT/ML IV SOLN
500.0000 [IU] | Freq: Once | INTRAVENOUS | Status: DC | PRN
Start: 2023-08-17 — End: 2023-08-17

## 2023-08-17 NOTE — Patient Instructions (Addendum)
[facility]  Discharge Instructions: Thank you for choosing [facility] to provide your oncology and hematology care.   If you have a lab appointment with the Cancer Center, please go directly to the Cancer Center and check in at the registration area.   Wear comfortable clothing and clothing appropriate for easy access to any Portacath or PICC line.   We strive to give you quality time with your provider. You may need to reschedule your appointment if you arrive late (15 or more minutes).  Arriving late affects you and other patients whose appointments are after yours.  Also, if you miss three or more appointments without notifying the office, you may be dismissed from the clinic at the provider's discretion.      For prescription refill requests, have your pharmacy contact our office and allow 72 hours for refills to be completed.    Today you received the following chemotherapy and/or immunotherapy agent: Gemcitabine (Gemzar)      To help prevent nausea and vomiting after your treatment, we encourage you to take your nausea medication as directed.  BELOW ARE SYMPTOMS THAT SHOULD BE REPORTED IMMEDIATELY: *FEVER GREATER THAN 100.4 F (38 C) OR HIGHER *CHILLS OR SWEATING *NAUSEA AND VOMITING THAT IS NOT CONTROLLED WITH YOUR NAUSEA MEDICATION *UNUSUAL SHORTNESS OF BREATH *UNUSUAL BRUISING OR BLEEDING *URINARY PROBLEMS (pain or burning when urinating, or frequent urination) *BOWEL PROBLEMS (unusual diarrhea, constipation, pain near the anus) TENDERNESS IN MOUTH AND THROAT WITH OR WITHOUT PRESENCE OF ULCERS (sore throat, sores in mouth, or a toothache) UNUSUAL RASH, SWELLING OR PAIN  UNUSUAL VAGINAL DISCHARGE OR ITCHING   Items with * indicate a potential emergency and should be followed up as soon as possible or go to the Emergency Department if any problems should occur.  Please show the CHEMOTHERAPY ALERT CARD or IMMUNOTHERAPY ALERT  CARD at check-in to the Emergency Department and triage nurse.  Should you have questions after your visit or need to cancel or reschedule your appointment, please contact [facility]  Dept: [phone]  and follow the prompts.  Office hours are 8:00 a.m. to 4:30 p.m. Monday - Friday. Please note that voicemails left after 4:00 p.m. may not be returned until the following business day.  We are closed weekends and major holidays. You have access to a nurse at all times for urgent questions. Please call the main number to the clinic Dept: [phone] and follow the prompts.   For any non-urgent questions, you may also contact your provider using MyChart. We now offer e-Visits for anyone 57 and older to request care online for non-urgent symptoms. For details visit [email or web address].   Also download the MyChart app! Go to the app store, search "MyChart", open the app, select [facility], and log in with your MyChart username and password.  Gemcitabine Injection What is this medication? GEMCITABINE (jem SYE ta been) treats some types of cancer. It works by slowing down the growth of cancer cells. This medicine may be used for other purposes; ask your health care provider or pharmacist if you have questions. COMMON BRAND NAME(S): Gemzar, Infugem What should I tell my care team before I take this medication? They need to know if you have any of these conditions: Blood disorders Infection Kidney disease Liver disease Lung or breathing disease, such as asthma or COPD Recent or ongoing radiation therapy An unusual or allergic reaction to gemcitabine, other medications, foods, dyes,  or preservatives If you or your partner are pregnant or trying to get pregnant Breast-feeding How should I use this medication? This medication is injected into a vein. It is given by your care team in a hospital or clinic setting. Talk to your care team about the use of this  medication in children. Special care may be needed. Overdosage: If you think you have taken too much of this medicine contact a poison control center or emergency room at once. NOTE: This medicine is only for you. Do not share this medicine with others. What if I miss a dose? Keep appointments for follow-up doses. It is important not to miss your dose. Call your care team if you are unable to keep an appointment. What may interact with this medication? Interactions have not been studied. This list may not describe all possible interactions. Give your health care provider a list of all the medicines, herbs, non-prescription drugs, or dietary supplements you use. Also tell them if you smoke, drink alcohol, or use illegal drugs. Some items may interact with your medicine. What should I watch for while using this medication? Your condition will be monitored carefully while you are receiving this medication. This medication may make you feel generally unwell. This is not uncommon, as chemotherapy can affect healthy cells as well as cancer cells. Report any side effects. Continue your course of treatment even though you feel ill unless your care team tells you to stop. In some cases, you may be given additional medications to help with side effects. Follow all directions for their use. This medication may increase your risk of getting an infection. Call your care team for advice if you get a fever, chills, sore throat, or other symptoms of a cold or flu. Do not treat yourself. Try to avoid being around people who are sick. This medication may increase your risk to bruise or bleed. Call your care team if you notice any unusual bleeding. Be careful brushing or flossing your teeth or using a toothpick because you may get an infection or bleed more easily. If you have any dental work done, tell your dentist you are receiving this medication. Avoid taking medications that contain aspirin, acetaminophen,  ibuprofen, naproxen, or ketoprofen unless instructed by your care team. These medications may hide a fever. Talk to your care team if you or your partner wish to become pregnant or think you might be pregnant. This medication can cause serious birth defects if taken during pregnancy and for 6 months after the last dose. A negative pregnancy test is required before starting this medication. A reliable form of contraception is recommended while taking this medication and for 6 months after the last dose. Talk to your care team about effective forms of contraception. Do not father a child while taking this medication and for 3 months after the last dose. Use a condom while having sex during this time period. Do not breastfeed while taking this medication and for at least 1 week after the last dose. This medication may cause infertility. Talk to your care team if you are concerned about your fertility. What side effects may I notice from receiving this medication? Side effects that you should report to your care team as soon as possible: Allergic reactions--skin rash, itching, hives, swelling of the face, lips, tongue, or throat Capillary leak syndrome--stomach or muscle pain, unusual weakness or fatigue, feeling faint or lightheaded, decrease in the amount of urine, swelling of the ankles, hands, or feet, trouble breathing  Infection--fever, chills, cough, sore throat, wounds that don't heal, pain or trouble when passing urine, general feeling of discomfort or being unwell Liver injury--right upper belly pain, loss of appetite, nausea, light-colored stool, dark yellow or brown urine, yellowing skin or eyes, unusual weakness or fatigue Low red blood cell level--unusual weakness or fatigue, dizziness, headache, trouble breathing Lung injury--shortness of breath or trouble breathing, cough, spitting up blood, chest pain, fever Stomach pain, bloody diarrhea, pale skin, unusual weakness or fatigue, decrease in  the amount of urine, which may be signs of hemolytic uremic syndrome Sudden and severe headache, confusion, change in vision, seizures, which may be signs of posterior reversible encephalopathy syndrome (PRES) Unusual bruising or bleeding Side effects that usually do not require medical attention (report to your care team if they continue or are bothersome): Diarrhea Drowsiness Hair loss Nausea Pain, redness, or swelling with sores inside the mouth or throat Vomiting This list may not describe all possible side effects. Call your doctor for medical advice about side effects. You may report side effects to FDA at 1-800-FDA-1088. Where should I keep my medication? This medication is given in a hospital or clinic. It will not be stored at home. NOTE: This sheet is a summary. It may not cover all possible information. If you have questions about this medicine, talk to your doctor, pharmacist, or health care provider.  2024 Elsevier/Gold Standard (2022-02-15 00:00:00)  Neutropenia Neutropenia is a condition that occurs when you have low levels of neutrophils. Neutrophils are a type of white blood cells. They are made in the spongy center of bones (bone marrow). They fight infections. Neutrophils are your body's main defense against infections. The fewer neutrophils you have and the longer your body remains without them, the greater your risk of getting a severe infection. What are the causes? This condition can occur if your body uses up or destroys neutrophils faster than your bone marrow can make them. Neutropenia may be caused by: A bacterial or fungal infection. Allergic disorders. Reactions to some medicines. An autoimmune disease. An enlarged spleen. This condition can also occur if your bone marrow does not produce enough neutrophils. This problem may be caused by: Cancer. Cancer treatments, such as radiation or chemotherapy. Viral infections. Medicines, such as phenytoin. Vitamin  B12 deficiency. Diseases of the bone marrow. Environmental toxins, such as insecticides. What are the signs or symptoms? This condition does not usually cause symptoms. If symptoms are present, they are usually caused by an underlying infection. Symptoms of an infection may include: Fever. Chills. Swollen glands. Mouth ulcers. Cough. Rash or skin infection. Skin may be red, swollen, or painful. Abdominal or rectal pain. Frequent urination or pain or burning with urination. Because neutropenia weakens the immune system, symptoms of infection may be reduced. It is important to be aware of any changes in your body and talk to your health care provider. How is this diagnosed? This condition is diagnosed based on your medical history and a physical exam. Tests will also be done, such as: A complete blood count (CBC). Bone marrow biopsy. This is collecting a sample of bone marrow for testing. A chest X-ray. A urine culture. A blood culture. How is this treated? Treatment depends on the underlying cause and severity of your condition. Mild neutropenia may not require treatment. Treatment may include medicines, such as: Antibiotic medicine given through an IV. Antiviral medicines. Antifungal medicines. A medicine to increase production of neutrophils (colony-stimulating factor). You may get this medicine through an IV or  by injection. Steroids given through an IV. If an underlying condition is causing neutropenia, you may need treatment for that condition. If medicines or cancer treatments are causing neutropenia, your health care provider may have you stop the medicines or treatment. Follow these instructions at home: Medicines  Take over-the-counter and prescription medicines only as told by your health care provider. Get an annual flu shot. Ask your health care provider whether you or anyone you live with needs any other vaccines. Eating and drinking Do not share food utensils. Do not  eat unpasteurized foods. Do not eat raw or undercooked meat, eggs, or seafood. Do not eat unwashed, raw fruits or vegetables. Lifestyle Avoid exposure to groups of people or children. Avoid being around people who are sick. Avoid being around live plants or fresh flowers. Avoid being around dirt or dust, such as in construction areas or gardens. Wear gloves if you are going to do yard work or gardening. Do not provide direct care for pets. Avoid animal droppings. Do not clean litter boxes and bird cages. Do not have sex unless your health care provider has approved. Hygiene  Bathe daily. Clean the area between the genitals and the anus (perineal area) after you urinate or have a bowel movement. If you are male, wipe from front to back. Get regular dental care and brush your teeth with a soft toothbrush before and after meals. Do not use a regular razor. Use an electric razor to remove hair. Wash your hands often with soap and water for at least 20 seconds. Make sure others who come in contact with you also wash their hands. If soap and water are not available, use hand sanitizer. General instructions Take steps to reduce your risk of injury or infection. Follow any precautions as told by your health care provider. Take actions to avoid cuts and burns. For example: Be cautious when you use knives. Always cut away from yourself. Keep knives in protective sheaths or guards when not in use. Use oven mitts when you cook with a hot stove, oven, or grill. Stand a safe distance away from open fires. Do not use tampons, enemas, or rectal suppositories unless your health care provider has approved. Keep all follow-up visits. This is important. Contact a health care provider if: You have a cough. You have a sore throat. You develop sores in your mouth or anus. You have a warm, red, or tender area on your skin. You have red streaks on the skin. You develop a rash. You have swollen lymph  nodes. You have frequent or painful urination. You have vaginal discharge or itching. Get help right away if: You have a fever. You have chills or shaking. You have nausea or vomiting. You have a lot of fatigue. You have shortness of breath. Summary Neutropenia is a condition that occurs when you have a lower-than-normal level of a type of white blood cell (neutrophils) in your body. This condition can occur if your body uses up or destroys neutrophils faster than your bone marrow can make them. Treatment depends on the underlying cause and severity of your condition. Mild neutropenia may not require treatment. Follow any precautions as told by your health care provider to reduce your risk for injury or infection. This information is not intended to replace advice given to you by your health care provider. Make sure you discuss any questions you have with your health care provider. Document Revised: 04/07/2021 Document Reviewed: 04/07/2021 Elsevier Patient Education  2024 [employer].

## 2023-08-17 NOTE — Progress Notes (Signed)
Per Dr. Mosetta Putt, ok for treatment today with ANC 0.9 and no order for GCSF. Sent scheduling a message to schedule port flush with labs in 2 weeks.

## 2023-08-18 ENCOUNTER — Other Ambulatory Visit: Payer: Medicare HMO

## 2023-08-18 NOTE — Telephone Encounter (Signed)
Scheduled appointments per referral. Patient is aware of the appointment time and date as well as the address. Patient was informed to arrive 10-15 minutes prior with updated insurance information. All questions were answered.

## 2023-08-28 ENCOUNTER — Other Ambulatory Visit: Payer: Self-pay

## 2023-08-28 DIAGNOSIS — C679 Malignant neoplasm of bladder, unspecified: Secondary | ICD-10-CM

## 2023-08-28 NOTE — Assessment & Plan Note (Signed)
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 -He is clinically doing well, asymptomatic.  Surgical resection was discussed with patient, but he would like to try nonsurgical options. -He started [facility] on 01/12/23, stopped in June 2024 due to disease progression. 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 -I recommended neoadjuvant chemotherapy with cisplatin and gemcitabine for 4 cycles, followed by total cystectomy and prostatectomy. -He started chemotherapy on June 08, 2023, and completed on 08/17/2023, overall tolerated well

## 2023-08-29 ENCOUNTER — Other Ambulatory Visit: Payer: Self-pay

## 2023-08-29 ENCOUNTER — Inpatient Hospital Stay (HOSPITAL_BASED_OUTPATIENT_CLINIC_OR_DEPARTMENT_OTHER): Payer: Medicare HMO

## 2023-08-29 ENCOUNTER — Ambulatory Visit (HOSPITAL_BASED_OUTPATIENT_CLINIC_OR_DEPARTMENT_OTHER)
Admission: RE | Admit: 2023-08-29 | Discharge: 2023-08-29 | Disposition: A | Discharge status: Home / Self Care | Payer: Medicare HMO | Source: Ambulatory Visit | Attending: Hematology | Admitting: Hematology

## 2023-08-29 ENCOUNTER — Other Ambulatory Visit: Payer: Self-pay | Admitting: *Deleted

## 2023-08-29 ENCOUNTER — Inpatient Hospital Stay: Payer: Medicare HMO | Attending: Hematology | Admitting: Hematology

## 2023-08-29 VITALS — BP 137/69 | HR 74 | Temp 97.9°F | Ht 75.0 in | Wt 210.0 lb

## 2023-08-29 DIAGNOSIS — C675 Malignant neoplasm of bladder neck: Secondary | ICD-10-CM | POA: Diagnosis not present

## 2023-08-29 DIAGNOSIS — Z452 Encounter for adjustment and management of vascular access device: Secondary | ICD-10-CM | POA: Insufficient documentation

## 2023-08-29 DIAGNOSIS — Z79899 Other long term (current) drug therapy: Secondary | ICD-10-CM | POA: Insufficient documentation

## 2023-08-29 DIAGNOSIS — Z95828 Presence of other vascular implants and grafts: Secondary | ICD-10-CM

## 2023-08-29 DIAGNOSIS — M7989 Other specified soft tissue disorders: Secondary | ICD-10-CM | POA: Diagnosis not present

## 2023-08-29 DIAGNOSIS — C679 Malignant neoplasm of bladder, unspecified: Secondary | ICD-10-CM | POA: Insufficient documentation

## 2023-08-29 LAB — CBC WITH DIFFERENTIAL (CANCER CENTER ONLY)
Abs Immature Granulocytes: 0.12 10*3/uL — ABNORMAL HIGH (ref 0.00–0.07)
Basophils Absolute: 0 10*3/uL (ref 0.0–0.1)
Basophils Relative: 0 %
Eosinophils Absolute: 0 10*3/uL (ref 0.0–0.5)
Eosinophils Relative: 0 %
HCT: 25.7 % — ABNORMAL LOW (ref 39.0–52.0)
Hemoglobin: 9 g/dL — ABNORMAL LOW (ref 13.0–17.0)
Immature Granulocytes: 3 %
Lymphocytes Relative: 44 %
Lymphs Abs: 1.8 10*3/uL (ref 0.7–4.0)
MCH: 34.1 pg — ABNORMAL HIGH (ref 26.0–34.0)
MCHC: 35 g/dL (ref 30.0–36.0)
MCV: 97.3 fL (ref 80.0–100.0)
Monocytes Absolute: 0.8 10*3/uL (ref 0.1–1.0)
Monocytes Relative: 20 %
Neutro Abs: 1.4 10*3/uL — ABNORMAL LOW (ref 1.7–7.7)
Neutrophils Relative %: 33 %
Platelet Count: 151 10*3/uL (ref 150–400)
RBC: 2.64 MIL/uL — ABNORMAL LOW (ref 4.22–5.81)
RDW: 20 % — ABNORMAL HIGH (ref 11.5–15.5)
WBC Count: 4.1 10*3/uL (ref 4.0–10.5)
nRBC: 0 % (ref 0.0–0.2)

## 2023-08-29 LAB — CMP (CANCER CENTER ONLY)
ALT: 6 U/L (ref 0–44)
AST: 10 U/L — ABNORMAL LOW (ref 15–41)
Albumin: 4 g/dL (ref 3.5–5.0)
Alkaline Phosphatase: 80 U/L (ref 38–126)
Anion gap: 7 (ref 5–15)
BUN: 16 mg/dL (ref 8–23)
CO2: 25 mmol/L (ref 22–32)
Calcium: 9.4 mg/dL (ref 8.9–10.3)
Chloride: 102 mmol/L (ref 98–111)
Creatinine: 0.9 mg/dL (ref 0.61–1.24)
GFR, Estimated: >60 mL/min (ref >60)
Glucose, Bld: 174 mg/dL — ABNORMAL HIGH (ref 70–99)
Potassium: 4 mmol/L (ref 3.5–5.1)
Sodium: 134 mmol/L — ABNORMAL LOW (ref 135–145)
Total Bilirubin: 0.4 mg/dL (ref <1.2)
Total Protein: 6.8 g/dL (ref 6.5–8.1)

## 2023-08-29 MED ORDER — SODIUM CHLORIDE 0.9% FLUSH
10.0000 mL | INTRAVENOUS | Status: DC | PRN
Start: 2023-08-29 — End: 2023-08-29
  Administered 2023-08-29: 10 mL

## 2023-08-29 MED ORDER — HEPARIN SOD (PORK) LOCK FLUSH 100 UNIT/ML IV SOLN
500.0000 [IU] | Freq: Once | INTRAVENOUS | Status: AC | PRN
Start: 2023-08-29 — End: 2023-08-29
  Administered 2023-08-29: 500 [IU]

## 2023-08-29 NOTE — Progress Notes (Signed)
Right lower extremity venous duplex has been completed. Preliminary results can be found in CV Proc through chart review.  Results were given to Tammy at Dr. Latanya Maudlin office.  08/29/23 2:20 PM Olen Cordial RVT

## 2023-08-29 NOTE — Progress Notes (Signed)
[facility]   [phone] [phone]   Clinic Follow up Note   Patient Care Team: Plotnikov, [name], MD as PCP - General (Internal Medicine) Nahser, Deloris Ping, MD as PCP - Cardiology (Cardiology) Verner Chol, [facility] (Inactive) as Pharmacist (Pharmacist) Carlus Pavlov, MD as Consulting Physician (Internal Medicine) Jerilee Field, MD as Consulting Physician (Urology) Iva Boop, MD as Consulting Physician (Gastroenterology) Janet Berlin, MD as Consulting Physician (Ophthalmology) Malachy Mood, MD as Attending Physician (Hematology and Oncology)  Date of Service:  08/29/2023  CHIEF COMPLAINT: f/u of bladder cancer  CURRENT THERAPY:  Pending surgery  Oncology History   Bladder cancer [facility]) Bladder cancer (HCC) -cT2N0M0 stage [name] diagnosed in 04/2023 -He has had 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 -He is clinically doing well, asymptomatic.  Surgical resection was discussed with patient, but he would like to try nonsurgical options. -He started [facility] on 01/12/23, stopped in June 2024 due to disease progression. 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 -I recommended neoadjuvant chemotherapy with cisplatin and gemcitabine for 4 cycles, followed by total cystectomy and prostatectomy. -He started chemotherapy on June 08, 2023, and completed on 08/17/2023, overall tolerated well      Assessment and Plan    Right Leg Swelling Chronic, intermittent swelling of the right leg, more pronounced in the ankle. No pain or difficulty in movement. Asymmetry of swelling raises concern for possible deep vein thrombosis. -Schedule Doppler ultrasound to rule out deep vein thrombosis.  Bladder Cancer Patient is post-chemotherapy with stable blood counts. Hemoglobin is slightly low, likely secondary to chemotherapy. No new symptoms reported. -Order CT scan of abdomen and pelvis to assess response to chemotherapy. -He has a follow-up appointment with his neurologist in 2 weeks, and plan to proceed with surgery -Schedule follow-up after surgery.  General Health Maintenance Patient reports intermittent pulsing sensation in ear, but no associated dizziness or hearing loss. Patient also reports maintaining physical activity with stationary bicycle and plans for high protein diet in preparation for surgery. -Continue current management and monitor symptoms. -Encourage continued physical activity and high protein diet.  Upcoming Surgery Patient has an appointment with a urologist for a surgical consultation on 09/12/2023. Patient also expresses desire for hip replacement surgery once cancer treatment is completed. -Ensure CT scan is completed prior to surgical consultation. -Plan for follow-up post-surgery.         SUMMARY OF ONCOLOGIC HISTORY: Oncology History Overview Note   Cancer Staging  Bladder cancer [facility]) Staging form: Urinary Bladder, AJCC 8th Edition - Clinical stage from 05/14/2021: Stage 0a (cTa, cN0, cM0) - Signed by Malachy Mood, MD on 01/02/2023 Stage prefix: Initial diagnosis WHO/ISUP grade (low/high): High Grade Histologic grading system: 2 grade system - Pathologic stage from 02/22/2022: Stage I (pT1, pN0, cM0) - Signed by Malachy Mood, MD on 01/02/2023 WHO/ISUP grade (low/high): High Grade Histologic grading system: 2 grade system     Bladder cancer (HCC)   05/14/2021 Cancer Staging   Staging form: Urinary Bladder, AJCC 8th Edition -  Clinical stage from 05/14/2021: Stage 0a (cTa, cN0, cM0) - Signed by Malachy Mood, MD on 01/02/2023 Stage prefix: Initial diagnosis WHO/ISUP grade (low/high): High Grade Histologic grading system: 2 grade system   01/31/2022 Initial Diagnosis   Bladder cancer (HCC)   02/22/2022 Cancer Staging   Staging form: Urinary Bladder, AJCC 8th Edition - Pathologic stage from 02/22/2022: Stage I (pT1, pN0, cM0) - Signed by Malachy Mood, MD on 01/02/2023 WHO/ISUP grade (low/high): High Grade Histologic grading system: 2 grade system   01/12/2023 - 04/06/2023 Chemotherapy   Patient is on Treatment Plan : BLADDER Pembrolizumab (200) q21d     05/05/2023 Pathology Results     FINAL MICROSCOPIC DIAGNOSIS:   A. BLADDER, RIGHT POSTERIOR, TURBT:  Noninvasive high grade papillary urothelial carcinoma  Muscularis propria (detrusor muscle) is not present   B. BLADDER NECK AND PROSTATIC URETHRA, BIOPSY:  High grade urothelial carcinoma  The carcinoma focally invades muscularis propria (detrusor muscle)     05/05/2023 Cancer Staging   Staging form: Urinary Bladder, AJCC 8th Edition - Clinical stage from 05/05/2023: Stage [name] (cT2, cN0, cM0) - Signed by Malachy Mood, MD on 06/15/2023 WHO/ISUP grade (low/high): High Grade Histologic grading system: 2 grade system   06/01/2023 Imaging   CT Chest, Abdomen, and Pelvis with contrast  IMPRESSION: 1. Similar irregular asymmetric wall thickening along the posterior urinary bladder with progressive irregular thickening of the right ureter extending to the level of the renal pelvis where there is now a masslike 16 x 12 mm soft tissue focus, suspicious for direct ureteral extension of disease into the renal pelvis. 2. Irregular asymmetric wall thickening along the posterior urinary bladder measuring 14 mm is similar prior. 3. Right double-J ureteral stent is in place with persistent perinephric/periureteric  stranding without significant hydronephrosis. 4. No convincing evidence of metastatic disease in the chest, abdomen or pelvis 5. Pancolonic diverticulosis without findings of acute diverticulitis. 6. Aortic Atherosclerosis (ICD10-I70.0) and Emphysema (ICD10-J43.9).   06/09/2023 -  Chemotherapy   Patient is on Treatment Plan : BLADDER Cisplatin D1 + Gemcitabine D1,8 q21d x 4 Cycles        Discussed the use of AI scribe software for clinical note transcription with the patient, who gave verbal consent to proceed.  History of Present Illness   The patient, a 72 year old with a history of blood cancer, presents with persistent right leg swelling. The swelling is more pronounced in the ankle and does not completely resolve, even in the morning. The patient denies pain in the leg or calf, and has no difficulty moving the ankle. The swelling has been a recurring issue, but does not interfere with the patient's mobility. The patient uses an adjustable bed and a cane for mobility due to a hip issue.  The patient also reports a pulsing sensation in the ear when standing up after sitting for a while. This sensation resolves within 15-20 seconds and does not cause dizziness. The patient's hearing is generally good, with only minor ringing in the ears.  The patient has an upcoming appointment with a urologist and a surgeon for an unrelated issue. The patient is also experiencing fluctuating energy levels, but does not feel excessively fatigued. The patient maintains a regular exercise routine, including using a stationary bicycle and plans to return to swimming post-surgery.         All other systems were reviewed with the patient and are negative.  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[facility])    endocrinologist--- dr Elvera Lennox --  (03-11-2022 per pt check blood sugar 1-2 times daily,  fasting sugar-- 118-120s)   Nocturia    Paroxysmal atrial fibrillation (HCC) 03/12/2019   cardiologist--- dr Melburn Popper   Ureterocele    Use of cane as ambulatory aid     SURGICAL HISTORY: Past Surgical History:  Procedure Laterality Date   BLEPHAROPLASTY Bilateral    per pt approx  2001;   upper eyelid's   COLONOSCOPY     last one approx 2023   CYSTOSCOPY WITH URETEROSCOPY AND STENT PLACEMENT Right 12/02/2022   Procedure: CYSTOSCOPY WITH RIGHT URETEROSCOPY, RIGHT DIAGNOSTIC URETEROSCOPY;  Surgeon: Jerilee Field, MD;  Location: [facility];  Service: Urology;  Laterality: Right;  90 MINS FOR CASE   CYSTOSCOPY/RETROGRADE/URETEROSCOPY Bilateral 05/05/2023   Procedure: CYSTOSCOPY BILATERAL RETROGRADE PYELOGRAM RIGHT URETEROSCOPY RIGHT STENT PLACEMENT;  Surgeon: Jerilee Field, MD;  Location: [facility];  Service: Urology;  Laterality: Bilateral;  90 MINS FOR CASE   IR IMAGING GUIDED PORT INSERTION  05/30/2023   PATELLAR TENDON REPAIR Left 04/16/2004   @[facility]    TRANSURETHRAL RESECTION OF BLADDER TUMOR N/A 06/18/2021   Procedure: TRANSURETHRAL RESECTION OF BLADDER TUMOR (TURBT) WITH RIGHT URETERAL STENT PLACEMENT, RIGHT URETEROSCOPY WITH DISTRACTION OF TUMOR, FULGURATION;  Surgeon: Jerilee Field, MD;  Location: [facility];  Service: Urology;  Laterality: N/A;   TRANSURETHRAL RESECTION OF BLADDER TUMOR N/A 03/15/2022   Procedure: TRANSURETHRAL RESECTION OF BLADDER TUMOR (TURBT) WITH BILATERAL URETEROSCOPY/URETHRAL/ DILATION/BILATERAL RETROGRADE PYELOGRAM/BIOPSY AND FULGURATION OF RIGHT URETERAL CANCER/RIGHT STENT PLACEMENT;  Surgeon: Jerilee Field, MD;  Location: [facility];  Service: Urology;  Laterality: N/A;   TRANSURETHRAL RESECTION OF BLADDER TUMOR N/A 05/05/2023   Procedure: TRANSURETHRAL RESECTION OF BLADDER TUMOR (TURBT) BLADDER BIOSPY;  Surgeon: Jerilee Field, MD;  Location: [facility];  Service: Urology;  Laterality: N/A;   TRANSURETHRAL RESECTION OF BLADDER TUMOR WITH MITOMYCIN-C N/A 12/02/2022   Procedure: TRANSURETHRAL RESECTION OF BLADDER TUMOR;  Surgeon: Jerilee Field, MD;  Location: [facility];  Service: Urology;  Laterality: N/A;   TRANSURETHRAL RESECTION OF PROSTATE  05/05/2023   Procedure: TRANSURETHRAL RESECTION OF THE PROSTATE (TURP);  Surgeon: Jerilee Field, MD;  Location: [facility];  Service: Urology;;    I have reviewed the social history and family history with the patient and they are unchanged from previous note.  ALLERGIES:  has No Known Allergies.  MEDICATIONS:  Current Outpatient Medications  Medication Sig Dispense Refill   acetaminophen (TYLENOL) 500 MG tablet Take 1,000 mg by mouth every 6 (six) hours as needed.     atorvastatin (LIPITOR) 40 MG tablet TAKE ONE TABLET BY MOUTH ONCE DAILY (Patient taking differently: at bedtime.) 90 tablet 1   B Complex-C (B-COMPLEX WITH VITAMIN C) tablet Take 1 tablet by mouth daily.     Cholecalciferol (VITAMIN D-3 PO) Take by mouth daily.     ELIQUIS 5 MG TABS tablet TAKE ONE TABLET BY MOUTH TWICE DAILY 60 tablet 5   fluticasone (FLONASE) 50 MCG/ACT nasal spray Place 1 spray into both nostrils  daily as needed for allergies or rhinitis.     glipiZIDE (GLUCOTROL) 5 MG tablet TAKE 1/2 TABLET BY MOUTH EVERY EVENING 45 tablet 3   glucose blood test strip 1 each by Other route daily. Use daily for glucose control - Dx 250.00 100 each 0   lidocaine-prilocaine (EMLA) cream Apply to affected area once 30 g 3   lisinopril (ZESTRIL) 40 MG tablet Take 1 tablet (40 mg total) by mouth daily. 90 tablet 3   metFORMIN (GLUCOPHAGE-XR) 500 MG 24 hr tablet Take 4  tablets (2,000 mg total) by mouth daily with supper. 360 tablet 3   metoprolol succinate (TOPROL-XL) 25 MG 24 hr tablet TAKE ONE TABLET BY MOUTH ONCE DAILY (Patient taking differently: at bedtime.) 90 tablet 3   Multiple Vitamins-Minerals (EMERGEN-C VITAMIN C) PACK Take 1 packet by mouth daily as needed (immune support).     ondansetron (ZOFRAN) 8 MG tablet Take 1 tablet (8 mg total) by mouth every 8 (eight) hours as needed for nausea or vomiting. Start on the third day after cisplatin. 30 tablet 1   oxybutynin (DITROPAN-XL) 10 MG 24 hr tablet TAKE 1 TABLET BY MOUTH EVERYDAY AT BEDTIME 90 tablet 1   prochlorperazine (COMPAZINE) 10 MG tablet Take 1 tablet (10 mg total) by mouth every 6 (six) hours as needed (Nausea or vomiting). 30 tablet 1   propranolol (INDERAL) 10 MG tablet TAKE 1 TABLET BY MOUTH 4 TIMES DAILY AS NEEDED FOR PALPITATIONS, IRREGULAR OR FAST HEART RATE 270 tablet 1   Semaglutide, 1 MG/DOSE, (OZEMPIC, 1 MG/DOSE,) 4 MG/3ML SOPN INJECT 1 MG UNDER THE SKIN ONCE A WEEK AS DIRECTED 9 mL 3   Sesame Oil OIL Sesame Oil (organic) ([employer] is a good brand)  1 TBSP twice daily. Sesame oil may help improve platelet counts; can take orally and also rub into dry skin areas such as the feet. Can add to salads as a dressing with oil/vinegar or dip with bread. Or take straight. Make sure it is cold pressed or expeller pressed. Make sure it is NOT the toasted sesame oil     tadalafil (CIALIS) 5 MG tablet Take 1 tablet (5 mg total) by mouth daily. 30 tablet 11   tamsulosin (FLOMAX) 0.4 MG CAPS capsule Take 0.4 mg by mouth at bedtime.     traMADol (ULTRAM) 50 MG tablet Take 1 tablet (50 mg total) by mouth every 6 (six) hours as needed for severe pain. 60 tablet 2   TURMERIC PO Curcumin Phytosome by Jacinto Reap  2 capsules twice daily to reduce inflammation, help with immune function; also has cancer prevention properties     No current facility-administered medications for this visit.     PHYSICAL EXAMINATION: ECOG PERFORMANCE STATUS: 1 - Symptomatic but completely ambulatory  Vitals:   08/29/23 1339  BP: 137/69  Pulse: 74  Temp: 97.9 F (36.6 C)  SpO2: 100%   Wt Readings from Last 3 Encounters:  08/29/23 210 lb (95.3 kg)  08/10/23 209 lb 14.4 oz (95.2 kg)  07/27/23 206 lb 1.3 oz (93.5 kg)     GENERAL:alert, no distress and comfortable SKIN: skin color, texture, turgor are normal, no rashes or significant lesions EYES: normal, Conjunctiva are pink and non-injected, sclera clear NECK: supple, thyroid normal size, non-tender, without nodularity LYMPH:  no palpable lymphadenopathy in the cervical, axillary  LUNGS: clear to auscultation and percussion with normal breathing effort HEART: regular rate & rhythm and no murmurs and no lower extremity edema ABDOMEN:abdomen soft, non-tender and  normal bowel sounds Musculoskeletal:no cyanosis of digits and no clubbing  NEURO: alert & oriented x 3 with fluent speech, no focal motor/sensory deficits   LABORATORY DATA:  I have reviewed the data as listed    Latest Ref Rng & Units 08/29/2023   12:55 PM 08/17/2023    1:46 PM 08/10/2023    8:02 AM  CBC  WBC 4.0 - 10.5 K/uL 4.1  2.6  4.3   Hemoglobin 13.0 - 17.0 g/dL 9.0  9.3  9.7   Hematocrit 39.0 - 52.0 % 25.7  25.3  28.1   Platelets 150 - 400 K/uL 151  156  206         Latest Ref Rng & Units 08/29/2023   12:55 PM 08/17/2023    1:46 PM 08/10/2023    8:02 AM  CMP  Glucose 70 - 99 mg/dL 073  710  626   BUN 8 - 23 mg/dL 16  15  10    Creatinine 0.61 - 1.24 mg/dL 9.48  5.46  2.70   Sodium 135 - 145 mmol/L 134  133  134   Potassium 3.5 - 5.1 mmol/L 4.0  3.9  4.2   Chloride 98 - 111 mmol/L 102  98  103   CO2 22 - 32 mmol/L 25  27  24    Calcium 8.9 - 10.3 mg/dL 9.4  9.3  8.6   Total Protein 6.5 - 8.1 g/dL 6.8  6.9  6.7   Total Bilirubin <1.2 mg/dL 0.4  0.4  0.3   Alkaline Phos 38 - 126 U/L 80  71  75   AST 15 - 41 U/L 10  13  12    ALT 0 - 44 U/L 6  14  7         RADIOGRAPHIC STUDIES: I have personally reviewed the radiological images as listed and agreed with the findings in the report. VAS [place] LOWER EXTREMITY VENOUS (DVT)  Result Date: 08/29/2023  Lower Venous DVT Study Patient Name:  Robert [name]  Date of Exam:   08/29/2023 Medical Rec #: [id number]              Accession #:    [id number] Date of Birth: Jun 26, 1951             Patient Gender: M Patient Age:   16 years Exam Location:  [facility] Procedure:      VAS [place] LOWER EXTREMITY VENOUS (DVT) Referring Phys: Malachy Mood --------------------------------------------------------------------------------  Indications: Swelling.  Risk Factors: Cancer. Anticoagulation: Eliquis. Comparison Study: No prior studies. Performing Technologist: Chanda Busing RVT  Examination Guidelines: A complete evaluation includes B-mode imaging, spectral Doppler, color Doppler, and power Doppler as needed of all accessible portions of each vessel. Bilateral testing is considered an integral part of a complete examination. Limited examinations for reoccurring indications may be performed as noted. The reflux portion of the exam is performed with the patient in reverse Trendelenburg.  +---------+---------------+---------+-----------+----------+--------------+ RIGHT    CompressibilityPhasicitySpontaneityPropertiesThrombus Aging +---------+---------------+---------+-----------+----------+--------------+ CFV      Full           Yes      Yes                                 +---------+---------------+---------+-----------+----------+--------------+ SFJ      Full                                                        +---------+---------------+---------+-----------+----------+--------------+  FV Prox  Full                                                        +---------+---------------+---------+-----------+----------+--------------+ FV Mid   Full                                                         +---------+---------------+---------+-----------+----------+--------------+ FV DistalFull                                                        +---------+---------------+---------+-----------+----------+--------------+ PFV      Full                                                        +---------+---------------+---------+-----------+----------+--------------+ POP      Full           Yes      Yes                                 +---------+---------------+---------+-----------+----------+--------------+ PTV      Full                                                        +---------+---------------+---------+-----------+----------+--------------+ PERO     Full                                                        +---------+---------------+---------+-----------+----------+--------------+   +----+---------------+---------+-----------+----------+--------------+ LEFTCompressibilityPhasicitySpontaneityPropertiesThrombus Aging +----+---------------+---------+-----------+----------+--------------+ CFV Full           Yes      Yes                                 +----+---------------+---------+-----------+----------+--------------+    Summary: RIGHT: - There is no evidence of deep vein thrombosis in the lower extremity.  - No cystic structure found in the popliteal fossa.  LEFT: - No evidence of common femoral vein obstruction.   *See table(s) above for measurements and observations.    Preliminary       Orders Placed This Encounter  Procedures   CT ABDOMEN PELVIS W CONTRAST    Standing Status:   Future    Standing Expiration Date:   08/28/2024    Order Specific Question:   If indicated for the ordered procedure, I authorize the administration of contrast media per Radiology protocol    Answer:  Yes    Order Specific Question:   Does the patient have a contrast media/X-ray dye allergy?    Answer:   No    Order Specific Question:   Preferred imaging  location?    Answer:   [facility]    Order Specific Question:   If indicated for the ordered procedure, I authorize the administration of oral contrast media per Radiology protocol    Answer:   Yes   All questions were answered. The patient knows to call the clinic with any problems, questions or concerns. No barriers to learning was detected. The total time spent in the appointment was 25 minutes.     Malachy Mood, MD 08/29/2023

## 2023-09-01 ENCOUNTER — Ambulatory Visit (HOSPITAL_COMMUNITY)
Admission: RE | Admit: 2023-09-01 | Discharge: 2023-09-01 | Disposition: A | Discharge status: Home / Self Care | Payer: Medicare HMO | Source: Ambulatory Visit | Attending: Nurse Practitioner

## 2023-09-01 DIAGNOSIS — C675 Malignant neoplasm of bladder neck: Secondary | ICD-10-CM | POA: Insufficient documentation

## 2023-09-01 DIAGNOSIS — I700 Atherosclerosis of aorta: Secondary | ICD-10-CM | POA: Diagnosis not present

## 2023-09-01 DIAGNOSIS — C679 Malignant neoplasm of bladder, unspecified: Secondary | ICD-10-CM | POA: Diagnosis not present

## 2023-09-01 DIAGNOSIS — J929 Pleural plaque without asbestos: Secondary | ICD-10-CM | POA: Diagnosis not present

## 2023-09-01 MED ORDER — IOHEXOL 300 MG/ML  SOLN
100.0000 mL | Freq: Once | INTRAMUSCULAR | Status: AC | PRN
  Administered 2023-09-01: 100 mL via INTRAVENOUS

## 2023-09-12 DIAGNOSIS — C661 Malignant neoplasm of right ureter: Secondary | ICD-10-CM | POA: Diagnosis not present

## 2023-09-12 DIAGNOSIS — C678 Malignant neoplasm of overlapping sites of bladder: Secondary | ICD-10-CM | POA: Diagnosis not present

## 2023-09-13 ENCOUNTER — Encounter: Payer: Self-pay | Admitting: Cardiovascular Disease

## 2023-09-13 ENCOUNTER — Other Ambulatory Visit: Payer: Self-pay

## 2023-09-13 ENCOUNTER — Emergency Department (HOSPITAL_COMMUNITY)
Admission: EM | Admit: 2023-09-13 | Discharge: 2023-09-13 | Disposition: A | Discharge status: Home / Self Care | Payer: Medicare HMO | Attending: Emergency Medicine | Admitting: Emergency Medicine

## 2023-09-13 DIAGNOSIS — T783XXA Angioneurotic edema, initial encounter: Secondary | ICD-10-CM | POA: Insufficient documentation

## 2023-09-13 DIAGNOSIS — R220 Localized swelling, mass and lump, head: Secondary | ICD-10-CM | POA: Diagnosis present

## 2023-09-13 DIAGNOSIS — Z79899 Other long term (current) drug therapy: Secondary | ICD-10-CM | POA: Diagnosis not present

## 2023-09-13 DIAGNOSIS — Z8551 Personal history of malignant neoplasm of bladder: Secondary | ICD-10-CM | POA: Insufficient documentation

## 2023-09-13 DIAGNOSIS — Z7984 Long term (current) use of oral hypoglycemic drugs: Secondary | ICD-10-CM | POA: Diagnosis not present

## 2023-09-13 DIAGNOSIS — I4891 Unspecified atrial fibrillation: Secondary | ICD-10-CM | POA: Insufficient documentation

## 2023-09-13 DIAGNOSIS — E119 Type 2 diabetes mellitus without complications: Secondary | ICD-10-CM | POA: Diagnosis not present

## 2023-09-13 DIAGNOSIS — Z7901 Long term (current) use of anticoagulants: Secondary | ICD-10-CM | POA: Diagnosis not present

## 2023-09-13 DIAGNOSIS — I10 Essential (primary) hypertension: Secondary | ICD-10-CM | POA: Insufficient documentation

## 2023-09-13 LAB — COMPREHENSIVE METABOLIC PANEL
ALT: 9 U/L (ref 0–44)
AST: 13 U/L — ABNORMAL LOW (ref 15–41)
Albumin: 4.1 g/dL (ref 3.5–5.0)
Alkaline Phosphatase: 78 U/L (ref 38–126)
Anion gap: 9 (ref 5–15)
BUN: 21 mg/dL (ref 8–23)
CO2: 26 mmol/L (ref 22–32)
Calcium: 9.2 mg/dL (ref 8.9–10.3)
Chloride: 99 mmol/L (ref 98–111)
Creatinine, Ser: 0.97 mg/dL (ref 0.61–1.24)
GFR, Estimated: >60 mL/min (ref >60)
Glucose, Bld: 144 mg/dL — ABNORMAL HIGH (ref 70–99)
Potassium: 4.5 mmol/L (ref 3.5–5.1)
Sodium: 134 mmol/L — ABNORMAL LOW (ref 135–145)
Total Bilirubin: 0.4 mg/dL (ref <1.2)
Total Protein: 7.5 g/dL (ref 6.5–8.1)

## 2023-09-13 LAB — CBC WITH DIFFERENTIAL/PLATELET
Abs Immature Granulocytes: 0.11 10*3/uL — ABNORMAL HIGH (ref 0.00–0.07)
Basophils Absolute: 0.1 10*3/uL (ref 0.0–0.1)
Basophils Relative: 1 %
Eosinophils Absolute: 0 10*3/uL (ref 0.0–0.5)
Eosinophils Relative: 0 %
HCT: 34.6 % — ABNORMAL LOW (ref 39.0–52.0)
Hemoglobin: 11.5 g/dL — ABNORMAL LOW (ref 13.0–17.0)
Immature Granulocytes: 1 %
Lymphocytes Relative: 21 %
Lymphs Abs: 2 10*3/uL (ref 0.7–4.0)
MCH: 33.3 pg (ref 26.0–34.0)
MCHC: 33.2 g/dL (ref 30.0–36.0)
MCV: 100.3 fL — ABNORMAL HIGH (ref 80.0–100.0)
Monocytes Absolute: 1.1 10*3/uL — ABNORMAL HIGH (ref 0.1–1.0)
Monocytes Relative: 11 %
Neutro Abs: 6.4 10*3/uL (ref 1.7–7.7)
Neutrophils Relative %: 66 %
Platelets: 214 10*3/uL (ref 150–400)
RBC: 3.45 MIL/uL — ABNORMAL LOW (ref 4.22–5.81)
RDW: 16.2 % — ABNORMAL HIGH (ref 11.5–15.5)
WBC: 9.7 10*3/uL (ref 4.0–10.5)
nRBC: 0 % (ref 0.0–0.2)

## 2023-09-13 MED ORDER — AMLODIPINE BESYLATE 5 MG PO TABS: 5.0000 mg | ORAL_TABLET | Freq: Every day | ORAL | 0 refills | Status: DC

## 2023-09-13 MED ORDER — METHYLPREDNISOLONE SODIUM SUCC 125 MG IJ SOLR
125.0000 mg | Freq: Once | INTRAMUSCULAR | Status: AC
  Administered 2023-09-13: 125 mg via INTRAVENOUS
  Filled 2023-09-13: qty 2

## 2023-09-13 MED ORDER — FAMOTIDINE IN NACL 20-0.9 MG/50ML-% IV SOLN
20.0000 mg | Freq: Once | INTRAVENOUS | Status: AC
  Administered 2023-09-13: 20 mg via INTRAVENOUS
  Filled 2023-09-13: qty 50

## 2023-09-13 MED ORDER — DIPHENHYDRAMINE HCL 50 MG/ML IJ SOLN
25.0000 mg | Freq: Once | INTRAMUSCULAR | Status: AC
  Administered 2023-09-13: 25 mg via INTRAVENOUS
  Filled 2023-09-13: qty 1

## 2023-09-13 NOTE — ED Notes (Signed)
Pt given water per MD. Pt tolerated well with no signs or c/o of difficulty swallowing.

## 2023-09-13 NOTE — ED Triage Notes (Signed)
Pt arrives with swelling of his tongue and difficulty swallowing, airway in tact. Pt states he has been on Lisinopril for 20 years, and has not eaten anything out of the ordinary.

## 2023-09-13 NOTE — Discharge Instructions (Addendum)
NEVER take Lisinopril again.  I am adding it to your allergy list.  Start Amlodipine (Norvasc) for your blood pressure instead.

## 2023-09-13 NOTE — ED Provider Notes (Signed)
[place] EMERGENCY DEPARTMENT AT [facility] Provider Note   CSN: [id number] Arrival date & time: 09/13/23  1748     History  Chief Complaint  Patient presents with   Oral Swelling    ARTIE HEEKIN II is a 72 y.o. male.  Pt is a 72 yo male with pmhx significant for dm, htn (on lisinopril), hld, afib (eliquis), bladder cancer (just completed chemo on 10/24).  Pt noticed that he had some tongue swelling around noon today.  He denies any sob.  He feels like it is hard to eat.       Home Medications Prior to Admission medications   Medication Sig Start Date End Date Taking? Authorizing Provider  amLODipine (NORVASC) 5 MG tablet Take 1 tablet (5 mg total) by mouth daily. 09/13/23  Yes Jacalyn Lefevre, MD  acetaminophen (TYLENOL) 500 MG tablet Take 1,000 mg by mouth every 6 (six) hours as needed.    [provider]  atorvastatin (LIPITOR) 40 MG tablet TAKE ONE TABLET BY MOUTH ONCE DAILY Patient taking differently: at bedtime. 03/06/23   Carlus Pavlov, MD  B Complex-C (B-COMPLEX WITH VITAMIN C) tablet Take 1 tablet by mouth daily.    [provider]  Cholecalciferol (VITAMIN D-3 PO) Take by mouth daily.    [provider]  ELIQUIS 5 MG TABS tablet TAKE ONE TABLET BY MOUTH TWICE DAILY 06/05/23   Nahser, Deloris Ping, MD  fluticasone [facility]) 50 MCG/ACT nasal spray Place 1 spray into both nostrils daily as needed for allergies or rhinitis.    [provider]  glipiZIDE (GLUCOTROL) 5 MG tablet TAKE 1/2 TABLET BY MOUTH EVERY EVENING 02/09/23   Carlus Pavlov, MD  glucose blood test strip 1 each by Other route daily. Use daily for glucose control - Dx 250.00 04/21/20   [name], [name], MD  lidocaine-prilocaine (EMLA) cream Apply to affected area once 05/24/23   Malachy Mood, MD  metFORMIN (GLUCOPHAGE-XR) 500 MG 24 hr tablet Take 4 tablets (2,000 mg total) by mouth daily with supper. 02/03/23   Carlus Pavlov, MD  metoprolol  succinate (TOPROL-XL) 25 MG 24 hr tablet TAKE ONE TABLET BY MOUTH ONCE DAILY Patient taking differently: at bedtime. 09/06/22   Nahser, Deloris Ping, MD  Multiple Vitamins-Minerals (EMERGEN-C VITAMIN C) PACK Take 1 packet by mouth daily as needed (immune support).    [provider]  ondansetron (ZOFRAN) 8 MG tablet Take 1 tablet (8 mg total) by mouth every 8 (eight) hours as needed for nausea or vomiting. Start on the third day after cisplatin. 05/24/23   Malachy Mood, MD  oxybutynin (DITROPAN-XL) 10 MG 24 hr tablet TAKE 1 TABLET BY MOUTH EVERYDAY AT BEDTIME 07/21/23   Malachy Mood, MD  prochlorperazine (COMPAZINE) 10 MG tablet Take 1 tablet (10 mg total) by mouth every 6 (six) hours as needed (Nausea or vomiting). 05/24/23   Malachy Mood, MD  propranolol (INDERAL) 10 MG tablet TAKE 1 TABLET BY MOUTH 4 TIMES DAILY AS NEEDED FOR PALPITATIONS, IRREGULAR OR FAST HEART RATE 10/25/22   Nahser, Deloris Ping, MD  Semaglutide, 1 MG/DOSE, (OZEMPIC, 1 MG/DOSE,) 4 MG/3ML SOPN INJECT 1 MG UNDER THE SKIN ONCE A WEEK AS DIRECTED 07/20/23   Carlus Pavlov, MD  Sesame Oil OIL Sesame Oil (organic) ([employer] is a good brand)  1 TBSP twice daily. Sesame oil may help improve platelet counts; can take orally and also rub into dry skin areas such as the feet. Can add to salads as a  dressing with oil/vinegar or dip with bread. Or take straight. Make sure it is cold pressed or expeller pressed. Make sure it is NOT the toasted sesame oil 06/22/23   [provider]  tadalafil (CIALIS) 5 MG tablet Take 1 tablet (5 mg total) by mouth daily. 07/03/23   Plotnikov, [name], MD  tamsulosin (FLOMAX) 0.4 MG CAPS capsule Take 0.4 mg by mouth at bedtime. 01/24/23   [provider]  traMADol (ULTRAM) 50 MG tablet Take 1 tablet (50 mg total) by mouth every 6 (six) hours as needed for severe pain. 07/03/23   Plotnikov, [name], MD  TURMERIC PO Curcumin Phytosome by Jacinto Reap  2 capsules twice daily to reduce inflammation, help  with immune function; also has cancer prevention properties 06/22/23   [provider]      Allergies    Lisinopril    Review of Systems   Review of Systems  HENT:         Tongue swelling  All other systems reviewed and are negative.   Physical Exam Updated Vital Signs BP 130/79   Pulse 84   Temp 98.8 F (37.1 C) (Oral)   Resp 15   SpO2 97%  Physical Exam Vitals and nursing note reviewed.  Constitutional:      Appearance: Normal appearance.  HENT:     Head: Normocephalic and atraumatic.     Comments: Left side of tongue swollen    Right Ear: External ear normal.     Left Ear: External ear normal.     Nose: Nose normal.     Mouth/Throat:     Mouth: Mucous membranes are moist.     Pharynx: Oropharynx is clear.  Eyes:     Extraocular Movements: Extraocular movements intact.     Conjunctiva/sclera: Conjunctivae normal.     Pupils: Pupils are equal, round, and reactive to light.  Cardiovascular:     Rate and Rhythm: Normal rate and regular rhythm.     Pulses: Normal pulses.     Heart sounds: Normal heart sounds.  Pulmonary:     Effort: Pulmonary effort is normal.     Breath sounds: Normal breath sounds.  Abdominal:     General: Abdomen is flat. Bowel sounds are normal.     Palpations: Abdomen is soft.  Musculoskeletal:        General: Normal range of motion.     Cervical back: Normal range of motion and neck supple.  Skin:    General: Skin is warm.     Capillary Refill: Capillary refill takes less than 2 seconds.  Neurological:     General: No focal deficit present.     Mental Status: He is alert and oriented to person, place, and time.  Psychiatric:        Mood and Affect: Mood normal.        Behavior: Behavior normal.     ED Results / Procedures / Treatments   Labs (all labs ordered are listed, but only abnormal results are displayed) Labs Reviewed  CBC WITH DIFFERENTIAL/PLATELET - Abnormal; Notable for the following components:      Result  Value   RBC 3.45 (*)    Hemoglobin 11.5 (*)    HCT 34.6 (*)    MCV 100.3 (*)    RDW 16.2 (*)    Monocytes Absolute 1.1 (*)    Abs Immature Granulocytes 0.11 (*)    All other components within normal limits  COMPREHENSIVE METABOLIC PANEL - Abnormal; Notable for  the following components:   Sodium 134 (*)    Glucose, Bld 144 (*)    AST 13 (*)    All other components within normal limits    EKG None  Radiology No results found.  Procedures Procedures    Medications Ordered in ED Medications  diphenhydrAMINE (BENADRYL) injection 25 mg (25 mg Intravenous Given 09/13/23 1901)  famotidine (PEPCID) IVPB 20 mg premix (0 mg Intravenous Stopped 09/13/23 1949)  methylPREDNISolone sodium succinate (SOLU-MEDROL) 125 mg/2 mL injection 125 mg (125 mg Intravenous Given 09/13/23 1902)    ED Course/ Medical Decision Making/ A&P                                 Medical Decision Making Amount and/or Complexity of Data Reviewed Labs: ordered.  Risk Prescription drug management.   This patient presents to the ED for concern of tongue swelling, this involves an extensive number of treatment options, and is a complaint that carries with it a high risk of complications and morbidity.  The differential diagnosis includes angioedema, allergic rxn   Co morbidities that complicate the patient evaluation  dm, htn (on lisinopril), hld, afib (eliquis), bladder cancer (just completed chemo on 10/24)   Additional history obtained:  Additional history obtained from epic chart review  Lab Tests:  I Ordered, and personally interpreted labs.  The pertinent results include:  cbc with hgb 11.5 (hgb 9 2 weeks ago), cmp nl   Cardiac Monitoring:  The patient was maintained on a cardiac monitor.  I personally viewed and interpreted the cardiac monitored which showed an underlying rhythm of: nsr   Medicines ordered and prescription drug management:  I ordered medication including benadryl,  solumedrol, pepcid  for sx  Reevaluation of the patient after these medicines showed that the patient improved I have reviewed the patients home medicines and have made adjustments as needed  Critical Interventions:  Allergy meds   Problem List / ED Course:  Angioedema:  Pt has been observed for several hours.  He has improved significantly.  He is able to swallow without any problems.  He is not sob. He knows to stop Lisinopril.  He is told to return if worse.     Reevaluation:  After the interventions noted above, I reevaluated the patient and found that they have :improved   Social Determinants of Health:  Lives at home   Dispostion:  After consideration of the diagnostic results and the patients response to treatment, I feel that the patent would benefit from discharge with outpatient f/u.          Final Clinical Impression(s) / ED Diagnoses Final diagnoses:  Angioedema, initial encounter    Rx / DC Orders ED Discharge Orders          Ordered    amLODipine (NORVASC) 5 MG tablet  Daily        09/13/23 2147              Jacalyn Lefevre, MD 09/13/23 2149

## 2023-09-14 ENCOUNTER — Encounter (HOSPITAL_COMMUNITY): Payer: Self-pay

## 2023-09-14 ENCOUNTER — Emergency Department (HOSPITAL_COMMUNITY)
Admission: EM | Admit: 2023-09-14 | Discharge: 2023-09-14 | Disposition: A | Discharge status: Home / Self Care | Payer: Medicare HMO | Attending: Emergency Medicine | Admitting: Emergency Medicine

## 2023-09-14 ENCOUNTER — Other Ambulatory Visit: Payer: Self-pay

## 2023-09-14 DIAGNOSIS — R609 Edema, unspecified: Secondary | ICD-10-CM | POA: Diagnosis not present

## 2023-09-14 DIAGNOSIS — E119 Type 2 diabetes mellitus without complications: Secondary | ICD-10-CM | POA: Diagnosis not present

## 2023-09-14 DIAGNOSIS — F17200 Nicotine dependence, unspecified, uncomplicated: Secondary | ICD-10-CM | POA: Insufficient documentation

## 2023-09-14 DIAGNOSIS — R509 Fever, unspecified: Secondary | ICD-10-CM | POA: Diagnosis not present

## 2023-09-14 DIAGNOSIS — Z79899 Other long term (current) drug therapy: Secondary | ICD-10-CM | POA: Insufficient documentation

## 2023-09-14 DIAGNOSIS — Z8551 Personal history of malignant neoplasm of bladder: Secondary | ICD-10-CM | POA: Insufficient documentation

## 2023-09-14 DIAGNOSIS — T783XXD Angioneurotic edema, subsequent encounter: Secondary | ICD-10-CM | POA: Diagnosis not present

## 2023-09-14 DIAGNOSIS — Z7901 Long term (current) use of anticoagulants: Secondary | ICD-10-CM | POA: Insufficient documentation

## 2023-09-14 DIAGNOSIS — I10 Essential (primary) hypertension: Secondary | ICD-10-CM | POA: Insufficient documentation

## 2023-09-14 DIAGNOSIS — R220 Localized swelling, mass and lump, head: Secondary | ICD-10-CM | POA: Diagnosis present

## 2023-09-14 DIAGNOSIS — T783XXA Angioneurotic edema, initial encounter: Secondary | ICD-10-CM | POA: Insufficient documentation

## 2023-09-14 DIAGNOSIS — Z7984 Long term (current) use of oral hypoglycemic drugs: Secondary | ICD-10-CM | POA: Diagnosis not present

## 2023-09-14 MED ORDER — EPINEPHRINE 0.3 MG/0.3ML IJ SOAJ: 0.3000 mg | INTRAMUSCULAR | 0 refills | Status: DC | PRN

## 2023-09-14 MED ORDER — EPINEPHRINE 0.3 MG/0.3ML IJ SOAJ
0.3000 mg | Freq: Once | INTRAMUSCULAR | Status: AC
  Administered 2023-09-14: 0.3 mg via INTRAMUSCULAR
  Filled 2023-09-14: qty 0.3

## 2023-09-14 MED ORDER — DIPHENHYDRAMINE HCL 25 MG PO CAPS
25.0000 mg | ORAL_CAPSULE | Freq: Once | ORAL | Status: DC
  Filled 2023-09-14: qty 1

## 2023-09-14 MED ORDER — DIPHENHYDRAMINE HCL 50 MG/ML IJ SOLN
25.0000 mg | Freq: Once | INTRAMUSCULAR | Status: AC
  Administered 2023-09-14: 25 mg via INTRAVENOUS
  Filled 2023-09-14: qty 1

## 2023-09-14 MED ORDER — FAMOTIDINE IN NACL 20-0.9 MG/50ML-% IV SOLN
20.0000 mg | Freq: Once | INTRAVENOUS | Status: AC
  Administered 2023-09-14: 20 mg via INTRAVENOUS
  Filled 2023-09-14: qty 50

## 2023-09-14 MED ORDER — DEXAMETHASONE SODIUM PHOSPHATE 10 MG/ML IJ SOLN
10.0000 mg | Freq: Once | INTRAMUSCULAR | Status: AC
  Administered 2023-09-14: 10 mg via INTRAMUSCULAR
  Filled 2023-09-14: qty 1

## 2023-09-14 NOTE — ED Triage Notes (Signed)
Pt BIB EMS from home for tongue and neck swelling. Pt has no c/o and is not SOB. Pt ambulatory and A&Ox4. Pt had the same issue yesterday.

## 2023-09-14 NOTE — ED Provider Notes (Signed)
Patient given in sign out by [place], PA-C.  Please review their note for patient HPI, physical exam, workup.  At this time the plan is reassessed at 8:30 AM to see if patient stable to be discharged.  On recheck at 8:47 AM patient was in no acute distress with stable vitals.  Physical exam was reassuring.  Swelling has gone down and patient does not have any muscle voices or signs of anaphylaxis.  I spoke to the patient patient states he feels comfortable being discharged and following up with his primary care provider to see if they can get the lisinopril switched as this is most likely causing his symptoms.  Patient already has steroids prescribed him however I will add on EpiPen as well.  Patient stable to be discharged.  Patient verbalized understanding acceptance of this plan.    Netta Corrigan, PA-C 09/14/23 0848    Dione Booze, MD 09/14/23 [phone]

## 2023-09-14 NOTE — Discharge Instructions (Signed)
Please follow-up with your primary care provider in regards recent ER visit.  Today you are given medications for an allergic reaction in which her symptoms improved.  Please pick up the steroids that were prescribed to you by the previous provider and I have also attached EpiPen's to the prescription as well in case this happens again.  Please file primary care provider in regards to your blood pressure meds as the lisinopril is most likely causing her symptoms.  If symptoms change or worsen please return to ER.

## 2023-09-14 NOTE — ED Provider Notes (Signed)
[place] EMERGENCY DEPARTMENT AT [facility] Provider Note   CSN: [id number] Arrival date & time: 09/14/23  0256     History  Chief Complaint  Patient presents with   Oral Swelling         Robert [name] is a 71 y.o. male.  Patient with past medical history significant for DM, hypertension on lisinopril, hyperlipidemia, atrial fibrillation on Eliquis, bladder cancer with chemo completed on October 24 presents to the emergency department complaining of continued tongue swelling.  He was seen yesterday in the emergency department for the same complaint.  Tongue swelling initially began around noon.  At that time he was treated with Benadryl, Pepcid, and Solu-Medrol.  He had significant improvement in symptoms and was discharged home.  There was some thought as to this may be related to lisinopril.  He had no other known exposures to potential allergens.  Tonight he returns with continued tongue swelling and throat swelling.  He states he is having a difficult time swallowing.  He does endorse being able to breathe without difficulty.       Home Medications Prior to Admission medications   Medication Sig Start Date End Date Taking? Authorizing Provider  acetaminophen (TYLENOL) 500 MG tablet Take 1,000 mg by mouth every 6 (six) hours as needed.   Yes [provider]  atorvastatin (LIPITOR) 40 MG tablet TAKE ONE TABLET BY MOUTH ONCE DAILY Patient taking differently: Take 40 mg by mouth at bedtime. 03/06/23  Yes Carlus Pavlov, MD  B Complex-C (B-COMPLEX WITH VITAMIN C) tablet Take 1 tablet by mouth daily.   Yes [provider]  Cholecalciferol (VITAMIN D-3 PO) Take 1 tablet by mouth daily.   Yes [provider]  ELIQUIS 5 MG TABS tablet TAKE ONE TABLET BY MOUTH TWICE DAILY 06/05/23  Yes Nahser, Deloris Ping, MD  fluticasone (FLONASE) 50 MCG/ACT nasal spray Place 1 spray into both nostrils daily as needed for allergies or rhinitis.   Yes  [provider]  glipiZIDE (GLUCOTROL) 5 MG tablet TAKE 1/2 TABLET BY MOUTH EVERY EVENING 02/09/23  Yes Carlus Pavlov, MD  metFORMIN (GLUCOPHAGE-XR) 500 MG 24 hr tablet Take 4 tablets (2,000 mg total) by mouth daily with supper. 02/03/23  Yes Carlus Pavlov, MD  metoprolol succinate (TOPROL-XL) 25 MG 24 hr tablet TAKE ONE TABLET BY MOUTH ONCE DAILY Patient taking differently: Take 25 mg by mouth at bedtime. 09/06/22  Yes Nahser, Deloris Ping, MD  oxybutynin (DITROPAN-XL) 10 MG 24 hr tablet TAKE 1 TABLET BY MOUTH EVERYDAY AT BEDTIME 07/21/23  Yes Malachy Mood, MD  propranolol (INDERAL) 10 MG tablet TAKE 1 TABLET BY MOUTH 4 TIMES DAILY AS NEEDED FOR PALPITATIONS, IRREGULAR OR FAST HEART RATE 10/25/22  Yes Nahser, Deloris Ping, MD  Semaglutide, 1 MG/DOSE, (OZEMPIC, 1 MG/DOSE,) 4 MG/3ML SOPN INJECT 1 MG UNDER THE SKIN ONCE A WEEK AS DIRECTED 07/20/23  Yes Carlus Pavlov, MD  tadalafil (CIALIS) 5 MG tablet Take 1 tablet (5 mg total) by mouth daily. Patient taking differently: Take 5 mg by mouth daily as needed for erectile dysfunction. 07/03/23  Yes Plotnikov, [name], MD  traMADol (ULTRAM) 50 MG tablet Take 1 tablet (50 mg total) by mouth every 6 (six) hours as needed for severe pain. 07/03/23  Yes Plotnikov, [name], MD  amLODipine (NORVASC) 5 MG tablet Take 1 tablet (5 mg total) by mouth daily. 09/13/23   Jacalyn Lefevre, MD  glucose blood test strip 1 each by Other route daily. Use  daily for glucose control - Dx 250.00 04/21/20   [name], [name], MD  lidocaine-prilocaine (EMLA) cream Apply to affected area once 05/24/23   Malachy Mood, MD  ondansetron (ZOFRAN) 8 MG tablet Take 1 tablet (8 mg total) by mouth every 8 (eight) hours as needed for nausea or vomiting. Start on the third day after cisplatin. Patient not taking: Reported on 09/14/2023 05/24/23   Malachy Mood, MD  prochlorperazine (COMPAZINE) 10 MG tablet Take 1 tablet (10 mg total) by mouth every 6 (six) hours as needed (Nausea or  vomiting). Patient not taking: Reported on 09/14/2023 05/24/23   Malachy Mood, MD      Allergies    Lisinopril    Review of Systems   Review of Systems  HENT:         Tongue swelling  All other systems reviewed and are negative.   Physical Exam Updated Vital Signs BP (!) 160/76   Pulse 99   Temp 98.1 F (36.7 C)   Resp 16   SpO2 99%  Physical Exam Vitals and nursing note reviewed.  Constitutional:      Appearance: Normal appearance.  HENT:     Head: Normocephalic and atraumatic.     Comments: Tongue swollen, swelling noted to neck and face    Right Ear: External ear normal.     Left Ear: External ear normal.     Nose: Nose normal.     Mouth/Throat:     Mouth: Mucous membranes are moist.     Pharynx: Oropharynx is clear.  Eyes:     Extraocular Movements: Extraocular movements intact.     Conjunctiva/sclera: Conjunctivae normal.     Pupils: Pupils are equal, round, and reactive to light.  Cardiovascular:     Rate and Rhythm: Normal rate and regular rhythm.     Pulses: Normal pulses.     Heart sounds: Normal heart sounds.  Pulmonary:     Effort: Pulmonary effort is normal.     Breath sounds: Normal breath sounds.  Abdominal:     General: Abdomen is flat. Bowel sounds are normal.     Palpations: Abdomen is soft.  Musculoskeletal:        General: Normal range of motion.     Cervical back: Normal range of motion and neck supple.  Skin:    General: Skin is warm.     Capillary Refill: Capillary refill takes less than 2 seconds.  Neurological:     General: No focal deficit present.     Mental Status: He is alert and oriented to person, place, and time.  Psychiatric:        Mood and Affect: Mood normal.        Behavior: Behavior normal.     ED Results / Procedures / Treatments   Labs (all labs ordered are listed, but only abnormal results are displayed) Labs Reviewed - No data to display   EKG None  Radiology No results found.  Procedures .Critical  Care  Performed by: Darrick Grinder, PA-C Authorized by: Darrick Grinder, PA-C   Critical care provider statement:    Critical care time (minutes):  30   Critical care was necessary to treat or prevent imminent or life-threatening deterioration of the following conditions: Anaphylactic reaction requiring EpiPen.   Critical care was time spent personally by me on the following activities:  Development of treatment plan with patient or surrogate, discussions with consultants, evaluation of patient's response to treatment, examination of patient, ordering  and review of laboratory studies, ordering and review of radiographic studies, ordering and performing treatments and interventions, pulse oximetry, re-evaluation of patient's condition and review of old charts     Medications Ordered in ED Medications  diphenhydrAMINE (BENADRYL) capsule 25 mg (25 mg Oral Not Given 09/14/23 0350)  dexamethasone (DECADRON) injection 10 mg (10 mg Intramuscular Given 09/14/23 0350)  EPINEPHrine (EPI-PEN) injection 0.3 mg (0.3 mg Intramuscular Given 09/14/23 0427)  famotidine (PEPCID) IVPB 20 mg premix (20 mg Intravenous New Bag/Given 09/14/23 0428)  diphenhydrAMINE (BENADRYL) injection 25 mg (25 mg Intravenous Given 09/14/23 0427)    ED Course/ Medical Decision Making/ A&P                                 Medical Decision Making Risk Prescription drug management.   This patient presents to the ED for concern of tongue swelling, this involves an extensive number of treatment options, and is a complaint that carries with it a high risk of complications and morbidity.  The differential diagnosis includes angioedema, allergic rxn   Co morbidities that complicate the patient evaluation  dm, htn (on lisinopril), hld, afib (eliquis), bladder cancer (just completed chemo on 10/24)   Additional history obtained:  Additional history obtained from epic chart review  Lab Tests:  I did not reorder labs  but reviewed once from yesterday.  Hemoglobin was 11.2.   Cardiac Monitoring:  The patient was maintained on a cardiac monitor.  I personally viewed and interpreted the cardiac monitored which showed an underlying rhythm of: Sinus rhythm.   Medicines ordered and prescription drug management:  I ordered medication including benadryl, Decadron, pepcid, EpiPen for signs of anaphylaxis/allergic reaction Reevaluation of the patient after these medicines showed that the patient improved I have reviewed the patients home medicines and have made adjustments as needed  Critical Interventions:  EpiPen   Problem List / ED Course:  Angioedema: Patient seems to be improving.  Will plan observation for 4 hours post EpiPen for improvement in symptoms   Reevaluation:  After the interventions noted above, I reevaluated the patient and found that they have :improved   Social Determinants of Health:  Patient smokes tobacco   Dispostion:  Patient care will be transferred to Wynonia Hazard, PA-C at shift handoff.  EpiPen administered at 430.  Patient will need observation until 830 and will need reassessment at that time.        Final Clinical Impression(s) / ED Diagnoses Final diagnoses:  Angioedema, subsequent encounter    Rx / DC Orders ED Discharge Orders     None         Pamala Duffel 09/14/23 0548    Dione Booze, MD 09/14/23 [phone]

## 2023-09-18 ENCOUNTER — Other Ambulatory Visit (HOSPITAL_COMMUNITY): Payer: Self-pay

## 2023-09-18 ENCOUNTER — Telehealth: Payer: Self-pay | Admitting: Cardiovascular Disease

## 2023-09-18 NOTE — Telephone Encounter (Signed)
*  STAT* If patient is at the pharmacy, call can be transferred to refill team.   1. Which medications need to be refilled? (please list name of each medication and dose if known)   metoprolol succinate (TOPROL-XL) 25 MG 24 hr tablet     2. Would you like to learn more about the convenience, safety, & potential cost savings by using the [facility]? No   3. Are you open to using the [facility] (Type [facility]. ) No   4. Which pharmacy/location (including street and city if local pharmacy) is medication to be sent to?  [id number] [place], [place] - [address]     5. Do they need a 30 day or 90 day supply? 30 day   Pt has scheduled appt on 09/28/23

## 2023-09-19 MED ORDER — METOPROLOL SUCCINATE ER 25 MG PO TB24: 25.0000 mg | ORAL_TABLET | Freq: Every day | ORAL | 2 refills | Status: AC

## 2023-09-19 NOTE — Telephone Encounter (Signed)
[facility] to [facility]

## 2023-09-25 ENCOUNTER — Ambulatory Visit: Payer: Medicare HMO | Admitting: Cardiovascular Disease

## 2023-10-02 ENCOUNTER — Encounter: Payer: Self-pay | Admitting: Cardiovascular Disease

## 2023-10-02 NOTE — Progress Notes (Unsigned)
Cardiology Office Note:    Date:  10/02/2023   ID:  Robert [name], DOB 1950/12/04, MRN [id number]  PCP:  Tresa Garter, MD  Cardiologist:  [name], MD   Referring MD: Tresa Garter, MD   Problem list 1.  Atrial fibrillation 2.  Hyperlipidemia 3.  Diabetes mellitus  Chief Complaint  Patient presents with   Hypertension   Atrial Fibrillation          11/06/17    Robert [name] is a 72 y.o. male with a hx of palpitations for the past several months.  They started the Monday before Thanksgiving. In the middle of the night to go the bathroom.  Felt some pounding in his ears. Counted his heart rate in the 140s.  Also verified his Hr  with his blood pressure cuff. The arrhythmia had resolved by the next morning. He has not had any dizziness/syncope or presyncope.  He exercises regularly.  He swims at the gym 5 days a week.  He is swimming speech seems to be about the same as before he was diagnosed with atrial fibrillation.  He had another episode 2 days later.  He went to the emergency room down in [facility] but was back in normal rhythm at that time.   He returned and saw his primary medical doctor and an event monitor was placed.  We found atrial fibrillation with a slightly rapid ventricular response.  He was called into the office for further evaluation and management.  Episodes seem to be at night Dr. Tawanna Cooler started Atenolol   Is retiured now,  [facility] .    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  Claudication-like symptoms.  Has seen Dr[facility] Darrick Penna  [facility] and his arterial flow is found to be near normal in both legs. . He has not been diagnosed with sleep apnea but his wife states that he snores.  April 19, 2018:   Robert [name]   is seen back today for follow-up of his paroxysmal atrial fib, hyperlipidemia, hypertension, diabetes mellitus.  He is basically asymptomatic.  He cannot tell any difference between  when he is in atrial fibrillation or in sinus rhythm.  He will occasionally feel his pulse and feel that it is irregular.  He typically will take a propranolol tablet with conversion back to sinus rhythm in less than an hour. Still exercising at the [facility] 5 days a week. Checked out sleep studies,   Was going to cost $4,000.  Diabetes is better controlled on Trulicity.   May 25, 2020:   Robert [name] is seen back today for a follow-up visit.  He has a history of paroxysmal atrial fibrillation, hyperlipidemia, diabetes mellitus. Has notice more Afib this past week.  Has been using his propranolol more  Has been under more stress  has changed is diet to a more plant based diet Trigs have improved.   Oc.t 25, 2022: Robert [name] is seen back today for follow up visit . 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 Has had hematuria while on eliquis  Dr. Mena Goes and I have worked out a stragy that he stops his Eliquis 3-4 days pror to his BCG  Then restart the Eliquis on the evening of his BCG. Has 6 more treatments.   Nov. 7,  2023 Robert [name] is seen for follow up of his PAF   No cp , no dyspnea  Has gotten used to the Afib , sometimes its difficult to tell when he has the Afib Rarely has rapid Afib   He is planning on having hip surgery early next year.  He is not having any complications.  He will be at low risk for his upcoming hip surgery.  It would be okay to hold the Eliquis for 2 to 3 days prior to his hip surgery.  He should restart the Eliquis as soon as possible following his hip surgery.  Dec. 10, 2024 Robert [name] is seen for follow up of his PAF On eliquis     Past Medical  History:  Diagnosis Date   Anticoagulant long-term use    eliquis--- managed by cardiology   Arthritis    Bladder cancer [facility])    urologist--- dr Mena Goes;  overlapping   History of basal cell carcinoma (BCC) excision    History of COVID-19 05/2019   per pt mild symptoms that resolved   HLD (hyperlipidemia)    HTN (hypertension)    Hx of colonic polyps    Insulin dependent type 2 diabetes mellitus [facility])    endocrinologist--- dr Elvera Lennox --  (03-11-2022 per pt check blood sugar 1-2 times daily,  fasting sugar-- 118-120s)   Nocturia    Paroxysmal atrial fibrillation (HCC) 03/12/2019   cardiologist--- dr Melburn Popper   Ureterocele    Use of cane as ambulatory aid     Past Surgical History:  Procedure Laterality Date   BLEPHAROPLASTY Bilateral    per pt approx  2001;   upper eyelid's   COLONOSCOPY     last one approx 2023   CYSTOSCOPY WITH URETEROSCOPY AND STENT PLACEMENT Right 12/02/2022   Procedure: CYSTOSCOPY WITH RIGHT URETEROSCOPY, RIGHT DIAGNOSTIC URETEROSCOPY;  Surgeon: Jerilee Field, MD;  Location: [facility];  Service: Urology;  Laterality: Right;  90 MINS FOR CASE   CYSTOSCOPY/RETROGRADE/URETEROSCOPY Bilateral 05/05/2023   Procedure: CYSTOSCOPY BILATERAL RETROGRADE PYELOGRAM RIGHT URETEROSCOPY RIGHT STENT PLACEMENT;  Surgeon: Jerilee Field, MD;  Location: [facility];  Service: Urology;  Laterality: Bilateral;  90 MINS FOR CASE   IR IMAGING GUIDED PORT INSERTION  05/30/2023   PATELLAR TENDON REPAIR Left 04/16/2004   @[facility]    TRANSURETHRAL RESECTION OF BLADDER TUMOR N/A 06/18/2021   Procedure: TRANSURETHRAL RESECTION OF BLADDER TUMOR (TURBT) WITH RIGHT URETERAL STENT PLACEMENT, RIGHT URETEROSCOPY WITH DISTRACTION OF TUMOR, FULGURATION;  Surgeon: Jerilee Field, MD;  Location: [facility];  Service: Urology;  Laterality: N/A;   TRANSURETHRAL RESECTION OF BLADDER TUMOR N/A 03/15/2022   Procedure: TRANSURETHRAL RESECTION OF BLADDER TUMOR (TURBT) WITH  BILATERAL URETEROSCOPY/URETHRAL/ DILATION/BILATERAL RETROGRADE PYELOGRAM/BIOPSY AND FULGURATION OF RIGHT URETERAL CANCER/RIGHT STENT PLACEMENT;  Surgeon: Jerilee Field, MD;  Location: [facility];  Service: Urology;  Laterality: N/A;   TRANSURETHRAL RESECTION OF BLADDER TUMOR N/A 05/05/2023   Procedure: TRANSURETHRAL RESECTION OF BLADDER TUMOR (TURBT) BLADDER BIOSPY;  Surgeon: Jerilee Field, MD;  Location: [facility];  Service: Urology;  Laterality: N/A;   TRANSURETHRAL RESECTION OF BLADDER TUMOR WITH MITOMYCIN-C N/A 12/02/2022   Procedure: TRANSURETHRAL RESECTION OF BLADDER TUMOR;  Surgeon: Jerilee Field, MD;  Location: [facility];  Service: Urology;  Laterality:  N/A;   TRANSURETHRAL RESECTION OF PROSTATE  05/05/2023   Procedure: TRANSURETHRAL RESECTION OF THE PROSTATE (TURP);  Surgeon: Jerilee Field, MD;  Location: [facility];  Service: Urology;;    Current Medications: No outpatient medications have been marked as taking for the 10/03/23 encounter (Office Visit) with Geniva Lohnes, Deloris Ping, MD.     Allergies:   Lisinopril   Social History   Socioeconomic History   Marital status: Married    Spouse name: Not on file   Number of children: 1   Years of education: Not on file   Highest education level: Not on file  Occupational History   Occupation: DVD's   Occupation: Occupational hygienist: Self Employed  Tobacco Use   Smoking status: Some Days    Types: Cigars    Last attempt to quit: 02/22/2011    Years since quitting: 12.6   Smokeless tobacco: Never  Vaping Use   Vaping status: Never Used  Substance and Sexual Activity   Alcohol use: Yes    Comment: 6-8oz /wk OCC MARTINI 2 TO 3 X WEEK   Drug use: Never   Sexual activity: Not on file  Other Topics Concern   Not on file  Social History Narrative   Patient is married he has been employed in Nurse, children's   Former smoker with occasional alcohol use   Social  Determinants of Health   Financial Resource Strain: Low Risk  (03/31/2023)   Overall Financial Resource Strain (CARDIA)    Difficulty of Paying Living Expenses: Not hard at all  Food Insecurity: No Food Insecurity (03/31/2023)   Hunger Vital Sign    Worried About Running Out of Food in the Last Year: Never true    Ran Out of Food in the Last Year: Never true  Transportation Needs: Patient Declined (02/23/2023)   PRAPARE - Transportation    Lack of Transportation (Medical): Patient declined    Lack of Transportation (Non-Medical): Patient declined  Physical Activity: Inactive (03/31/2023)   Exercise Vital Sign    Days of Exercise per Week: 0 days    Minutes of Exercise per Session: 0 min  Stress: No Stress Concern Present (03/31/2023)   [employer] of Occupational Health - Occupational Stress Questionnaire    Feeling of Stress : Not at all  Social Connections: Socially Integrated (03/31/2023)   Social Connection and Isolation Panel [NHANES]    Frequency of Communication with Friends and Family: More than three times a week    Frequency of Social Gatherings with Friends and Family: More than three times a week    Attends Religious Services: More than 4 times per year    Active Member of [employer] or Organizations: Yes    Attends Engineer, structural: More than 4 times per year    Marital Status: Married     Family History: The patient's family history includes Aortic aneurysm in an other family member; Cancer in his paternal uncle and another family member; Pancreatic cancer (age of onset: 79) in his mother. There is no history of Colon cancer, Rectal cancer, Stomach cancer, or Esophageal cancer.  ROS:   Please see the history of present illness.     All other systems reviewed and are negative.  EKGs/Labs/Other Studies Reviewed:    The following studies were reviewed today:   EKG:           Recent Labs: 02/27/2023: TSH 1.06 08/10/2023: Magnesium 1.7 09/13/2023: ALT 9; BUN  21; Creatinine, Ser 0.97; Hemoglobin 11.5; Platelets 214; Potassium 4.5; Sodium 134  Recent Lipid Panel    Component Value Date/Time   CHOL 116 02/27/2023 1547   TRIG 150.0 (H) 02/27/2023 1547   HDL 38.60 (L) 02/27/2023 1547   CHOLHDL 3 02/27/2023 1547   VLDL 30.0 02/27/2023 1547   LDLCALC 47 02/27/2023 1547   LDLDIRECT 77.0 07/07/2015 0930    Physical Exam:     Physical Exam: There were no vitals taken for this visit.  No BP recorded.  {Refresh Note OR Click here to enter BP  :1}***    GEN:  Well nourished, well developed in no acute distress HEENT: Normal NECK: No JVD; No carotid bruits LYMPHATICS: No lymphadenopathy CARDIAC: RRR ***, no murmurs, rubs, gallops RESPIRATORY:  Clear to auscultation without rales, wheezing or rhonchi  ABDOMEN: Soft, non-tender, non-distended MUSCULOSKELETAL:  No edema; No deformity  SKIN: Warm and dry NEUROLOGIC:  Alert and oriented x 3       ASSESSMENT:    No diagnosis found.    PLAN:     Paroxysmal atrial fibrillation:   CHADS2VASC  = 3  ( age 53, DM, HTN)      2.  HTN:      3.  Pre op visit for possible hip surgery :   He is planning on having hip surgery early next year.  He is not having any cardiac complications.  He will be at low risk for his upcoming hip surgery.  It would be okay to hold the Eliquis for 2 to 3 days prior to his hip surgery.  He should restart the Eliquis as soon as possible following his hip surgery.      Medication Adjustments/Labs and Tests Ordered: Current medicines are reviewed at length with the patient today.  Concerns regarding medicines are outlined above.  No orders of the defined types were placed in this encounter.   No orders of the defined types were placed in this encounter.    Signed, [name], MD  10/02/2023 9:53 PM    [facility]

## 2023-10-03 ENCOUNTER — Ambulatory Visit: Payer: Medicare HMO | Attending: Cardiovascular Disease | Admitting: Cardiovascular Disease

## 2023-10-03 ENCOUNTER — Encounter: Payer: Self-pay | Admitting: Cardiovascular Disease

## 2023-10-03 VITALS — BP 126/78 | HR 79 | Ht 75.0 in | Wt 207.2 lb

## 2023-10-03 DIAGNOSIS — I480 Paroxysmal atrial fibrillation: Secondary | ICD-10-CM | POA: Diagnosis not present

## 2023-10-03 NOTE — Patient Instructions (Signed)
Testing/Procedures: ECHO Your physician has requested that you have an echocardiogram. Echocardiography is a painless test that uses sound waves to create images of your heart. It provides your doctor with information about the size and shape of your heart and how well your heart's chambers and valves are working. This procedure takes approximately one hour. There are no restrictions for this procedure. Please do NOT wear cologne, perfume, aftershave, or lotions (deodorant is allowed). Please arrive 15 minutes prior to your appointment time.  Please note: We ask at that you not bring children with you during ultrasound (echo/ vascular) testing. Due to room size and safety concerns, children are not allowed in the ultrasound rooms during exams. Our front office staff cannot provide observation of children in our lobby area while testing is being conducted. An adult accompanying a patient to their appointment will only be allowed in the ultrasound room at the discretion of the ultrasound technician under special circumstances. We apologize for any inconvenience.  Follow-Up: At [facility], you and your health needs are our priority.  As part of our continuing mission to provide you with exceptional heart care, we have created designated Provider Care Teams.  These Care Teams include your primary Cardiologist (physician) and Advanced Practice Providers (APPs -  Physician Assistants and Nurse Practitioners) who all work together to provide you with the care you need, when you need it.  Your next appointment:   1 year(s)  Provider:   Lennie Odor, MD

## 2023-10-05 ENCOUNTER — Encounter: Payer: Self-pay | Admitting: Internal Medicine

## 2023-10-05 ENCOUNTER — Ambulatory Visit: Payer: Medicare HMO | Admitting: Internal Medicine

## 2023-10-05 VITALS — BP 122/70 | HR 75 | Ht 75.0 in | Wt 207.8 lb

## 2023-10-05 DIAGNOSIS — E119 Type 2 diabetes mellitus without complications: Secondary | ICD-10-CM | POA: Diagnosis not present

## 2023-10-05 DIAGNOSIS — Z7985 Long-term (current) use of injectable non-insulin antidiabetic drugs: Secondary | ICD-10-CM

## 2023-10-05 DIAGNOSIS — Z7984 Long term (current) use of oral hypoglycemic drugs: Secondary | ICD-10-CM | POA: Diagnosis not present

## 2023-10-05 DIAGNOSIS — E785 Hyperlipidemia, unspecified: Secondary | ICD-10-CM | POA: Diagnosis not present

## 2023-10-05 DIAGNOSIS — E663 Overweight: Secondary | ICD-10-CM

## 2023-10-05 LAB — POCT GLYCOSYLATED HEMOGLOBIN (HGB A1C): Hemoglobin A1C: 5.9 % — AB (ref 4.0–5.6)

## 2023-10-05 MED ORDER — GLIPIZIDE 5 MG PO TABS: 2.5000 mg | ORAL_TABLET | Freq: Every day | ORAL | Status: DC

## 2023-10-05 NOTE — Progress Notes (Addendum)
Patient ID: Robert [name], male   DOB: 10/26/1950, 72 y.o.   MRN: [id number]   HPI: Robert [name] is a 72 y.o.-year-old male, returning for follow-up for  DM2, dx in 2000s, insulin-independent since 2018, uncontrolled, with complications (PN, ED).  Last visit 6 months ago.  Interim history: Last year, he was diagnosed with bladder cancer and had transurethral resection on 06/18/2021.  The cancer was high-grade, but noninvasive. He was on BCG bladder instillations - off and on Eliquis.  He had a cancer recurrence before our visit in 11/2021. He was on Keytruda.  He had TURP 12/02/2022.  He was started on chemotherapy before last visit -finished. He will have surgery to remove the bladder, prostate, and part of kidney- next mo. He is quite stressed about this especially as he is the caregiver for his wife, who has dementia. No blurry vision, nausea, chest pain.  He was in the emergency room with angioedema 11/20 and 21//2024. This was considered 2/2 Lisinopril. He was treated with injectable steroids.  He now has an EpiPen.  He had leg swelling before stopping lisinopril, but this resolved.  Reviewed HbA1c levels: 06/22/2023: HbA1c 6.5% (outside) Lab Results  Component Value Date   HGBA1C 6.5 (A) 06/12/2023   HGBA1C 6.7 (A) 02/03/2023   HGBA1C 6.2 (H) 11/25/2022   HGBA1C 6.5 (A) 08/01/2022   HGBA1C 6.7 (A) 03/28/2022   HGBA1C 7.6 (A) 12/06/2021   HGBA1C 6.7 (A) 08/03/2021   HGBA1C 6.5 (A) 01/28/2021   HGBA1C 6.4 (A) 07/30/2020   HGBA1C 6.4 (A) 03/26/2020   Pt is on a regimen of: - Trulicity 1.5 >> 3 mg weekly >> Ozempic 1 mg weekly - Metformin Er 1000 mg 2x a day >> 2000 mg with dinner >>  did not start >> diarrhea >> 1000 mg 2x a day - Glipizide 2.5 mg before a large dinner >> Glipizide ER 2.5 mg before breakfast and a crushed tablet before a large meal >> Glipizide 5 mg 2x a day >> 2.5 mg mg before b'fast We stopped Novolog 70/30 10 units 2x a day in 11/2017, when starting  Trulicity.  Pt checks his sugars 1-2 times a day: - am: 1111-163, 190 >> 125-167, 180 >> 120-140, 230s - 2h after b'fast:119-147, 157 > 126-155, 182 >> 136-171 >> n/c - before lunch:  135-142 >> 140-173, 239 >> n/c >> 99, 110 - 2h after lunch: 124-168 >> 160-179, 282 >> 172 >> 135 >> n/c - before dinner: 128, 132-159, 207 >> 147 >> 107-171 >> 112-128 - 2h after dinner: 121-162, 188 >> n/c >> 110-123 >> 138, 146, 202 >> ? - bedtime: 121-138 >> 99-150s >> 103-135, 159 >> 88-123 >> n/a - nighttime: 129 >> 91 (wine) >> 118 >> n/c Lowest sugar was 28 right after starting insulin >> ... 88 >> 99; he has hypoglycemia awareness in the 70s. Highest sugar was 280s >> 193 >> 202 >> 254  Glucometer: ReliOn  Pt's meals are: - Breakfast: whole wheat toast + butte - Lunch: tuna salad or tuna sandwich ([employer] delivery), soft taco, meat + baked beans or potatoes - Dinner: [employer] delivery - Snacks: 1-0: nuts, bananas, other fruit He decreased his alcohol intake. He was swimming 5 times a week before the coronavirus pandemic, now exercising at home.  -No CKD; last BUN/creatinine:  Lab Results  Component Value Date   BUN 21 09/13/2023   BUN 16 08/29/2023   CREATININE 0.97 09/13/2023   CREATININE 0.90  08/29/2023   Lab Results  Component Value Date   MICRALBCREAT 8.4 01/28/2021   MICRALBCREAT 2.8 07/23/2018   MICRALBCREAT 1.8 03/01/2017   MICRALBCREAT 0.9 03/20/2015   MICRALBCREAT 0.8 02/10/2014   MICRALBCREAT 0.9 12/11/2012   MICRALBCREAT 1.8 12/20/2011   MICRALBCREAT 0.6 12/10/2010   MICRALBCREAT 4.4 12/07/2009   MICRALBCREAT 30.6 (H) 11/26/2007  Off lisinopril 40 >> now stopped 2/2 angioedema.  -+ HL; last set of lipids: Lab Results  Component Value Date   CHOL 116 02/27/2023   HDL 38.60 (L) 02/27/2023   LDLCALC 47 02/27/2023   LDLDIRECT 77.0 07/07/2015   TRIG 150.0 (H) 02/27/2023   CHOLHDL 3 02/27/2023  On Lipitor 40 and co-Q10.  - last eye exam was in 2024: No DR, +  cataract.   - + numbness but no tingling in his feet.  Last foot exam 06/12/2023.    He has a h/o arrhythmia (A fib) - wore an event monitor >> now on beta blocker. Cardiologist: Dr. Elease Hashimoto. He has hip pain (OA) in the hip - he prefers to stand, walks with a cane.  He needs a hip replacement.  ROS: + See HPI  I reviewed pt's medications, allergies, PMH, social hx, family hx, and changes were documented in the history of present illness. Otherwise, unchanged from my initial visit note.  Past Medical History:  Diagnosis Date   Anticoagulant long-term use    eliquis--- managed by cardiology   Arthritis    Bladder cancer [facility])    urologist--- dr Mena Goes;  overlapping   History of basal cell carcinoma (BCC) excision    History of COVID-19 05/2019   per pt mild symptoms that resolved   HLD (hyperlipidemia)    HTN (hypertension)    Hx of colonic polyps    Insulin dependent type 2 diabetes mellitus [facility])    endocrinologist--- dr Elvera Lennox --  (03-11-2022 per pt check blood sugar 1-2 times daily,  fasting sugar-- 118-120s)   Nocturia    Paroxysmal atrial fibrillation (HCC) 03/12/2019   cardiologist--- dr Melburn Popper   Ureterocele    Use of cane as ambulatory aid    Past Surgical History:  Procedure Laterality Date   BLEPHAROPLASTY Bilateral    per pt approx  2001;   upper eyelid's   COLONOSCOPY     last one approx 2023   CYSTOSCOPY WITH URETEROSCOPY AND STENT PLACEMENT Right 12/02/2022   Procedure: CYSTOSCOPY WITH RIGHT URETEROSCOPY, RIGHT DIAGNOSTIC URETEROSCOPY;  Surgeon: Jerilee Field, MD;  Location: [facility];  Service: Urology;  Laterality: Right;  90 MINS FOR CASE   CYSTOSCOPY/RETROGRADE/URETEROSCOPY Bilateral 05/05/2023   Procedure: CYSTOSCOPY BILATERAL RETROGRADE PYELOGRAM RIGHT URETEROSCOPY RIGHT STENT PLACEMENT;  Surgeon: Jerilee Field, MD;  Location[facility];  Service: Urology;  Laterality: Bilateral;  90 MINS FOR CASE   IR IMAGING GUIDED PORT INSERTION   05/30/2023   PATELLAR TENDON REPAIR Left 04/16/2004   @[facility]    TRANSURETHRAL RESECTION OF BLADDER TUMOR N/A 06/18/2021   Procedure: TRANSURETHRAL RESECTION OF BLADDER TUMOR (TURBT) WITH RIGHT URETERAL STENT PLACEMENT, RIGHT URETEROSCOPY WITH DISTRACTION OF TUMOR, FULGURATION;  Surgeon: Jerilee Field, MD;  Location[facility];  Service: Urology;  Laterality: N/A;   TRANSURETHRAL RESECTION OF BLADDER TUMOR N/A 03/15/2022   Procedure: TRANSURETHRAL RESECTION OF BLADDER TUMOR (TURBT) WITH BILATERAL URETEROSCOPY/URETHRAL/ DILATION/BILATERAL RETROGRADE PYELOGRAM/BIOPSY AND FULGURATION OF RIGHT URETERAL CANCER/RIGHT STENT PLACEMENT;  Surgeon: Jerilee Field, MD;  Location: [facility];  Service: Urology;  Laterality: N/A;   TRANSURETHRAL RESECTION OF  BLADDER TUMOR N/A 05/05/2023   Procedure: TRANSURETHRAL RESECTION OF BLADDER TUMOR (TURBT) BLADDER BIOSPY;  Surgeon: Jerilee Field, MD;  Location: [facility];  Service: Urology;  Laterality: N/A;   TRANSURETHRAL RESECTION OF BLADDER TUMOR WITH MITOMYCIN-C N/A 12/02/2022   Procedure: TRANSURETHRAL RESECTION OF BLADDER TUMOR;  Surgeon: Jerilee Field, MD;  Location: [facility];  Service: Urology;  Laterality: N/A;   TRANSURETHRAL RESECTION OF PROSTATE  05/05/2023   Procedure: TRANSURETHRAL RESECTION OF THE PROSTATE (TURP);  Surgeon: Jerilee Field, MD;  Location: [facility];  Service: Urology;;   Social History   Socioeconomic History   Marital status: Married    Spouse name: Not on file   Number of children: 2  Social Needs  Occupational History   Occupation: DVD's   Occupation: Occupational hygienist: Self Employed, retired  Tobacco Use   Smoking status: Former Smoker    Types: Cigars    Last attempt to quit: 02/22/2011   Smokeless tobacco: Never Used   Tobacco comment: 2 cigars monthly or less  Substance and Sexual Activity   Alcohol use: Yes     0.0 oz    Vodka, 2-3  drinks 3x a week   Drugs: No   Current Outpatient Medications on File Prior to Visit  Medication Sig Dispense Refill   acetaminophen (TYLENOL) 500 MG tablet Take 1,000 mg by mouth every 6 (six) hours as needed.     amLODipine (NORVASC) 5 MG tablet Take 1 tablet (5 mg total) by mouth daily. 30 tablet 0   atorvastatin (LIPITOR) 40 MG tablet TAKE ONE TABLET BY MOUTH ONCE DAILY (Patient taking differently: Take 40 mg by mouth at bedtime.) 90 tablet 1   B Complex-C (B-COMPLEX WITH VITAMIN C) tablet Take 1 tablet by mouth daily.     Cholecalciferol (VITAMIN D-3 PO) Take 1 tablet by mouth daily.     ELIQUIS 5 MG TABS tablet TAKE ONE TABLET BY MOUTH TWICE DAILY 60 tablet 5   EPINEPHrine 0.3 mg/0.3 mL IJ SOAJ injection Inject 0.3 mg into the muscle as needed for anaphylaxis. 1 each 0   fluticasone (FLONASE) 50 MCG/ACT nasal spray Place 1 spray into both nostrils daily as needed for allergies or rhinitis.     glipiZIDE (GLUCOTROL) 5 MG tablet TAKE 1/2 TABLET BY MOUTH EVERY EVENING 45 tablet 3   glucose blood test strip 1 each by Other route daily. Use daily for glucose control - Dx 250.00 100 each 0   metFORMIN (GLUCOPHAGE-XR) 500 MG 24 hr tablet Take 4 tablets (2,000 mg total) by mouth daily with supper. 360 tablet 3   metoprolol succinate (TOPROL-XL) 25 MG 24 hr tablet Take 1 tablet (25 mg total) by mouth daily. 90 tablet 2   propranolol (INDERAL) 10 MG tablet TAKE 1 TABLET BY MOUTH 4 TIMES DAILY AS NEEDED FOR PALPITATIONS, IRREGULAR OR FAST HEART RATE 270 tablet 1   Semaglutide, 1 MG/DOSE, (OZEMPIC, 1 MG/DOSE,) 4 MG/3ML SOPN INJECT 1 MG UNDER THE SKIN ONCE A WEEK AS DIRECTED 9 mL 3   tadalafil (CIALIS) 5 MG tablet Take 1 tablet (5 mg total) by mouth daily. (Patient taking differently: Take 5 mg by mouth daily as needed for erectile dysfunction.) 30 tablet 11   traMADol (ULTRAM) 50 MG tablet Take 1 tablet (50 mg total) by mouth every 6 (six) hours as needed for severe pain. 60 tablet 2   No current  facility-administered medications on file prior to visit.   Allergies  Allergen Reactions   Lisinopril Swelling   Family History  Problem Relation Age of Onset   Pancreatic cancer Mother 77   Cancer Paternal Uncle        unknown type cancer   Aortic aneurysm Other        family history   Cancer Other        Breast (1st degree relative)   Colon cancer Neg Hx    Rectal cancer Neg Hx    Stomach cancer Neg Hx    Esophageal cancer Neg Hx    PE: BP 122/70   Pulse 75   Ht 6\' 3"  (1.905 m)   Wt 207 lb 12.8 oz (94.3 kg)   SpO2 98%   BMI 25.97 kg/m  Wt Readings from Last 12 Encounters:  10/05/23 207 lb 12.8 oz (94.3 kg)  10/03/23 207 lb 3.2 oz (94 kg)  08/29/23 210 lb (95.3 kg)  08/10/23 209 lb 14.4 oz (95.2 kg)  07/27/23 206 lb 1.3 oz (93.5 kg)  07/20/23 206 lb 1.6 oz (93.5 kg)  07/06/23 198 lb 4 oz (89.9 kg)  07/03/23 201 lb (91.2 kg)  06/29/23 203 lb 12.8 oz (92.4 kg)  06/15/23 200 lb 9.6 oz (91 kg)  06/12/23 205 lb (93 kg)  06/09/23 206 lb 4.8 oz (93.6 kg)    Constitutional: overweight, in NAD Eyes:  EOMI, no exophthalmos ENT: no neck masses, no cervical lymphadenopathy Cardiovascular: RRR, No MRG Respiratory: CTA B Musculoskeletal: no deformities Skin:no rashes Neurological: no tremor with outstretched hands  ASSESSMENT: 1. DM2, insulin-independent, uncontrolled, with complications - PN - ED  2. HL  3.  Overweight  PLAN:  1. Patient with longstanding, reasonably controlled type 2 diabetes, on oral medication with metformin and sulfonylurea and weekly GLP-1 receptor agonist.  A latest HbA1c was 6.5% in 05/2023.  Sugars appear to be slightly higher than target but he was complaining of diarrhea with metformin so we split the metformin dose into 2.  He was taking a higher dose of glipizide than recommended at last visit, but in the setting of higher blood sugars, we can continue this regimen as he does not have any low blood sugars.  We did not change the Ozempic  dose. -At today's visit, sugars remain mostly at goal, with few hyperglycemic exceptions.  Especially in the light of the decreasing HbA1c, I do not absolutely feel that we need to change his regimen for now.  At next visit, if sugars remain controlled, we would probably be able to stop glipizide. - I suggested to:  Patient Instructions  Please continue: - Metformin ER 1000 mg twice a day with meals - Glipizide 2.5 mg before breakfast  - Ozempic 1 mg weekly  Please return in 4-6 months with your sugar log.   - we checked his HbA1c: 5.9% (lower) - advised to check sugars at different times of the day - 1x a day, rotating check times - advised for yearly eye exams >> he is UTD - return to clinic in 4 months  2.  HL -Reviewed latest lipid panel from 02/2023: Fractions at goal with the exception of a slightly low HDL: Lab Results  Component Value Date   CHOL 116 02/27/2023   HDL 38.60 (L) 02/27/2023   LDLCALC 47 02/27/2023   LDLDIRECT 77.0 07/07/2015   TRIG 150.0 (H) 02/27/2023   CHOLHDL 3 02/27/2023  -He continue his on Lipitor 40 mg daily and co-Q10 without side effects.  3.  Overweight -He lost approximately  26 pounds immediately after starting GLP-1 receptor agonist and after switching to a more plant-based diet -He lost 10 pounds before last visit, previously gained 8  -He gained 2 net pounds since last visit  Component     Latest Ref Rng 10/05/2023  Hemoglobin A1C     4.0 - 5.6 % 5.9 !   Microalb, Ur     mg/dL 40.9   MICROALB/CREAT RATIO     <30 mg/g creat 172 (H)   Creatinine, Urine     20 - 320 mg/dL 811   Urinary proteins are slightly elevated.  This could be related to stopping lisinopril.  At this point, I would suggest an SGLT2 inhibitor.  Will check with the patient.  Carlus Pavlov, MD PhD [facility]

## 2023-10-05 NOTE — Patient Instructions (Addendum)
Please continue: - Metformin ER 1000 mg twice a day with meals - Glipizide 2.5 mg before breakfast  - Ozempic 1 mg weekly  Please return in 4-6 months with your sugar log.

## 2023-10-06 LAB — MICROALBUMIN / CREATININE URINE RATIO
Creatinine, Urine: 158 mg/dL (ref 20–320)
Microalb Creat Ratio: 172 mg/g{creat} — ABNORMAL HIGH (ref <30)
Microalb, Ur: 27.2 mg/dL

## 2023-10-09 ENCOUNTER — Encounter: Payer: Self-pay | Admitting: Internal Medicine

## 2023-10-09 ENCOUNTER — Ambulatory Visit (INDEPENDENT_AMBULATORY_CARE_PROVIDER_SITE_OTHER): Payer: Medicare HMO | Admitting: Internal Medicine

## 2023-10-09 VITALS — BP 120/82 | HR 86 | Temp 98.3°F | Ht 75.0 in | Wt 209.0 lb

## 2023-10-09 DIAGNOSIS — Z7985 Long-term (current) use of injectable non-insulin antidiabetic drugs: Secondary | ICD-10-CM

## 2023-10-09 DIAGNOSIS — E785 Hyperlipidemia, unspecified: Secondary | ICD-10-CM | POA: Diagnosis not present

## 2023-10-09 DIAGNOSIS — C675 Malignant neoplasm of bladder neck: Secondary | ICD-10-CM | POA: Diagnosis not present

## 2023-10-09 DIAGNOSIS — E119 Type 2 diabetes mellitus without complications: Secondary | ICD-10-CM | POA: Diagnosis not present

## 2023-10-09 DIAGNOSIS — Z888 Allergy status to other drugs, medicaments and biological substances status: Secondary | ICD-10-CM | POA: Diagnosis not present

## 2023-10-09 DIAGNOSIS — Z7984 Long term (current) use of oral hypoglycemic drugs: Secondary | ICD-10-CM

## 2023-10-09 DIAGNOSIS — I10 Essential (primary) hypertension: Secondary | ICD-10-CM

## 2023-10-09 NOTE — Assessment & Plan Note (Signed)
Resolved

## 2023-10-09 NOTE — Assessment & Plan Note (Signed)
Continue on atorvastatin.

## 2023-10-09 NOTE — Assessment & Plan Note (Signed)
Continue with  Toprol, Amlodipine  Lisinopril allergic

## 2023-10-09 NOTE — Assessment & Plan Note (Signed)
Continue with Ozempic and glipizide, Metformin

## 2023-10-09 NOTE — Progress Notes (Signed)
Subjective:  Patient ID: Robert [name], male    DOB: 04-Dec-1950  Age: 72 y.o. MRN: [id number]  CC: Medical Management of Chronic Issues (3 mnth f/u)   HPI Robert [name] presents for cancer, incontinence, HTN  Outpatient Medications Prior to Visit  Medication Sig Dispense Refill   acetaminophen (TYLENOL) 500 MG tablet Take 1,000 mg by mouth every 6 (six) hours as needed.     amLODipine (NORVASC) 5 MG tablet Take 1 tablet (5 mg total) by mouth daily. 30 tablet 0   atorvastatin (LIPITOR) 40 MG tablet TAKE ONE TABLET BY MOUTH ONCE DAILY (Patient taking differently: Take 40 mg by mouth at bedtime.) 90 tablet 1   B Complex-C (B-COMPLEX WITH VITAMIN C) tablet Take 1 tablet by mouth daily.     Cholecalciferol (VITAMIN D-3 PO) Take 1 tablet by mouth daily.     ELIQUIS 5 MG TABS tablet TAKE ONE TABLET BY MOUTH TWICE DAILY 60 tablet 5   EPINEPHrine 0.3 mg/0.3 mL IJ SOAJ injection Inject 0.3 mg into the muscle as needed for anaphylaxis. 1 each 0   fluticasone (FLONASE) 50 MCG/ACT nasal spray Place 1 spray into both nostrils daily as needed for allergies or rhinitis.     glipiZIDE (GLUCOTROL) 5 MG tablet Take 0.5 tablets (2.5 mg total) by mouth daily before breakfast.     glucose blood test strip 1 each by Other route daily. Use daily for glucose control - Dx 250.00 100 each 0   metFORMIN (GLUCOPHAGE-XR) 500 MG 24 hr tablet Take 4 tablets (2,000 mg total) by mouth daily with supper. 360 tablet 3   metoprolol succinate (TOPROL-XL) 25 MG 24 hr tablet Take 1 tablet (25 mg total) by mouth daily. 90 tablet 2   propranolol (INDERAL) 10 MG tablet TAKE 1 TABLET BY MOUTH 4 TIMES DAILY AS NEEDED FOR PALPITATIONS, IRREGULAR OR FAST HEART RATE 270 tablet 1   Semaglutide, 1 MG/DOSE, (OZEMPIC, 1 MG/DOSE,) 4 MG/3ML SOPN INJECT 1 MG UNDER THE SKIN ONCE A WEEK AS DIRECTED 9 mL 3   tadalafil (CIALIS) 5 MG tablet Take 1 tablet (5 mg total) by mouth daily. (Patient taking differently: Take 5 mg by mouth  daily as needed for erectile dysfunction.) 30 tablet 11   traMADol (ULTRAM) 50 MG tablet Take 1 tablet (50 mg total) by mouth every 6 (six) hours as needed for severe pain. 60 tablet 2   No facility-administered medications prior to visit.    ROS: Review of Systems  Constitutional:  Negative for appetite change, fatigue and unexpected weight change.  HENT:  Negative for congestion, nosebleeds, sneezing, sore throat and trouble swallowing.   Eyes:  Negative for itching and visual disturbance.  Respiratory:  Negative for cough.   Cardiovascular:  Negative for chest pain, palpitations and leg swelling.  Gastrointestinal:  Negative for abdominal distention, blood in stool, diarrhea and nausea.  Genitourinary:  Negative for frequency and hematuria.  Musculoskeletal:  Negative for back pain, gait problem, joint swelling and neck pain.  Skin:  Negative for rash.  Neurological:  Negative for dizziness, tremors, speech difficulty and weakness.  Psychiatric/Behavioral:  Negative for agitation, dysphoric mood and sleep disturbance. The patient is not nervous/anxious.     Objective:  BP 120/82 (BP Location: Right Arm, Patient Position: Sitting, Cuff Size: Normal)   Pulse 86   Temp 98.3 F (36.8 C) (Oral)   Ht 6\' 3"  (1.905 m)   Wt 209 lb (94.8 kg)   SpO2 97%  BMI 26.12 kg/m   BP Readings from Last 3 Encounters:  10/09/23 120/82  10/05/23 122/70  10/03/23 126/78    Wt Readings from Last 3 Encounters:  10/09/23 209 lb (94.8 kg)  10/05/23 207 lb 12.8 oz (94.3 kg)  10/03/23 207 lb 3.2 oz (94 kg)    Physical Exam Constitutional:      General: He is not in acute distress.    Appearance: He is well-developed. He is not ill-appearing.     Comments: NAD  Eyes:     Conjunctiva/sclera: Conjunctivae normal.     Pupils: Pupils are equal, round, and reactive to light.  Neck:     Thyroid: No thyromegaly.     Vascular: No JVD.  Cardiovascular:     Rate and Rhythm: Normal rate and regular  rhythm.     Heart sounds: Normal heart sounds. No murmur heard.    No friction rub. No gallop.  Pulmonary:     Effort: Pulmonary effort is normal. No respiratory distress.     Breath sounds: Normal breath sounds. No wheezing or rales.  Chest:     Chest wall: No tenderness.  Abdominal:     General: Bowel sounds are normal. There is no distension.     Palpations: Abdomen is soft. There is no mass.     Tenderness: There is no abdominal tenderness. There is no guarding or rebound.  Musculoskeletal:        General: No tenderness. Normal range of motion.     Cervical back: Normal range of motion.  Lymphadenopathy:     Cervical: No cervical adenopathy.  Skin:    General: Skin is warm and dry.     Findings: No rash.  Neurological:     Mental Status: He is alert and oriented to person, place, and time.     Cranial Nerves: No cranial nerve deficit.     Motor: No abnormal muscle tone.     Coordination: Coordination normal.     Gait: Gait normal.     Deep Tendon Reflexes: Reflexes are normal and symmetric.  Psychiatric:        Behavior: Behavior normal.        Thought Content: Thought content normal.        Judgment: Judgment normal.   Hair loss Limping - R hip w/pain  Lab Results  Component Value Date   WBC 9.7 09/13/2023   HGB 11.5 (L) 09/13/2023   HCT 34.6 (L) 09/13/2023   PLT 214 09/13/2023   GLUCOSE 144 (H) 09/13/2023   CHOL 116 02/27/2023   TRIG 150.0 (H) 02/27/2023   HDL 38.60 (L) 02/27/2023   LDLDIRECT 77.0 07/07/2015   LDLCALC 47 02/27/2023   ALT 9 09/13/2023   AST 13 (L) 09/13/2023   NA 134 (L) 09/13/2023   K 4.5 09/13/2023   CL 99 09/13/2023   CREATININE 0.97 09/13/2023   BUN 21 09/13/2023   CO2 26 09/13/2023   TSH 1.06 02/27/2023   PSA 6.51 (H) 02/27/2023   HGBA1C 5.9 (A) 10/05/2023   MICROALBUR 27.2 10/05/2023    No results found.  Assessment & Plan:   Problem List Items Addressed This Visit     Hyperlipidemia   Continue on atorvastatin       Essential hypertension - Primary   Continue with  Toprol, Amlodipine  Lisinopril allergic      Type 2 diabetes mellitus without complication, without long-term current use of insulin (HCC)   Continue with Ozempic and glipizide, Metformin  Bladder cancer [facility])   Planning Nephrectomy and bladder resection for cure - Jan 2025 Discussed      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   Resolved         No orders of the defined types were placed in this encounter.     Follow-up: Return in about 3 months (around 01/07/2024) for a follow-up visit.  Sonda Primes, MD

## 2023-10-09 NOTE — Assessment & Plan Note (Signed)
Planning Nephrectomy and bladder resection for cure - Jan 2025 Discussed

## 2023-10-13 ENCOUNTER — Encounter: Payer: Self-pay | Admitting: Internal Medicine

## 2023-10-16 ENCOUNTER — Other Ambulatory Visit: Payer: Self-pay | Admitting: Internal Medicine

## 2023-10-16 MED ORDER — DAPAGLIFLOZIN PROPANEDIOL 10 MG PO TABS: 10.0000 mg | ORAL_TABLET | Freq: Every day | ORAL | 3 refills | Status: DC

## 2023-11-02 ENCOUNTER — Other Ambulatory Visit: Payer: Self-pay | Admitting: Urology

## 2023-11-02 ENCOUNTER — Telehealth: Payer: Self-pay | Admitting: Cardiovascular Disease

## 2023-11-02 NOTE — Telephone Encounter (Signed)
   Pre-operative Risk Assessment    Patient Name: Robert [name]  DOB: 1950/10/25 MRN: [id number]      Request for Surgical Clearance    Procedure:   CYSTOSCOPY WITH RETROGRADE PYELOGRAM/STENT PLACEMENT  RIGHT ROBOT ASSISTED Nephroureterectomy and CYSTOPROSTATECTOMY XI ROBOTIC ASSISTED PELVIC LYMPH NODE DISSECTION/CONDUIT DIVERSOIN TOTAL URETHRECTOMY,RADICAL    Date of Surgery:  Clearance 12/08/23                                 Surgeon:  Alvaro Ricardo KATHEE Mickey., MD  Surgeon's Group or Practice Name:  [facility]  Phone number:  [phone] Fax number:  [phone]   Type of Clearance Requested:   - Medical  - Pharmacy:  Hold Apixaban  (Eliquis ) 2 days   Type of Anesthesia:  General    Additional requests/questions:    [name]   11/02/2023, 4:25 PM

## 2023-11-03 ENCOUNTER — Ambulatory Visit (HOSPITAL_COMMUNITY): Payer: Medicare HMO | Attending: Cardiovascular Disease

## 2023-11-03 DIAGNOSIS — I480 Paroxysmal atrial fibrillation: Secondary | ICD-10-CM | POA: Diagnosis not present

## 2023-11-03 LAB — ECHOCARDIOGRAM COMPLETE
Area-P 1/2: 2.81 cm2
P 1/2 time: 715 ms
S' Lateral: 2.5 cm

## 2023-11-03 MED ORDER — PERFLUTREN LIPID MICROSPHERE
1.0000 mL | INTRAVENOUS | Status: AC | PRN
  Administered 2023-11-03: 2 mL via INTRAVENOUS

## 2023-11-06 NOTE — Telephone Encounter (Signed)
 Patient with diagnosis of afib on Eliquis  for anticoagulation.    Procedure: CYSTOSCOPY WITH RETROGRADE PYELOGRAM/STENT PLACEMENT  RIGHT ROBOT ASSISTED Nephroureterectomy and CYSTOPROSTATECTOMY XI ROBOTIC ASSISTED PELVIC LYMPH NODE DISSECTION/CONDUIT DIVERSOIN TOTAL URETHRECTOMY,RADICAL  Date of procedure: 12/08/23   CHA2DS2-VASc Score = 3   This indicates a 3.2% annual risk of stroke. The patient's score is based upon: CHF History: 0 HTN History: 1 Diabetes History: 1 Stroke History: 0 Vascular Disease History: 0 Age Score: 1 Gender Score: 0      CrCl 91 ml/min Platelet count 214  Per office protocol, patient can hold Eliquis  for 2 days prior to procedure.    **This guidance is not considered finalized until pre-operative APP has relayed final recommendations.**

## 2023-11-06 NOTE — Telephone Encounter (Signed)
   Patient Name: Robert [name]  DOB: 03-13-1951 MRN: [id number]  Primary Cardiologist: Aleene Passe, MD  Chart reviewed as part of pre-operative protocol coverage. Pre-op clearance already addressed by colleagues in earlier phone notes. To summarize recommendations:  -He is stable from a cardiac standpoint to have his urologic procedure Ok to hold Eliquis  for 2 days prior to his procedure. Please resume when medically safe to do so. -Dr. Passe  Will route this bundled recommendation to requesting provider via Epic fax function and remove from pre-op pool. Please call with questions.  Orren LOISE Fabry, PA-C 11/06/2023, 8:54 AM

## 2023-11-07 ENCOUNTER — Telehealth: Payer: Self-pay

## 2023-11-07 DIAGNOSIS — I422 Other hypertrophic cardiomyopathy: Secondary | ICD-10-CM

## 2023-11-07 NOTE — Telephone Encounter (Signed)
 Left detailed message per DPR of results and recommendation for cardiac MRI. Order placed at this time. Advised pt to call back if wanting to discuss further.

## 2023-11-07 NOTE — Telephone Encounter (Signed)
-----   Message from Aleene Passe sent at 11/04/2023  9:31 AM EST ----- Normal LV systolic function.  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   Will get a cardiac MRI  He is stable from a cardiac standpoint to have his urologic procedure  Ok to hold Eliquis  for 2 days prior to his procedure

## 2023-11-13 MED ORDER — EMPAGLIFLOZIN 10 MG PO TABS: 10.0000 mg | ORAL_TABLET | Freq: Every day | ORAL | 1 refills | Status: DC

## 2023-11-28 DIAGNOSIS — C661 Malignant neoplasm of right ureter: Secondary | ICD-10-CM | POA: Diagnosis not present

## 2023-11-28 DIAGNOSIS — C678 Malignant neoplasm of overlapping sites of bladder: Secondary | ICD-10-CM | POA: Diagnosis not present

## 2023-11-28 DIAGNOSIS — R8271 Bacteriuria: Secondary | ICD-10-CM | POA: Diagnosis not present

## 2023-12-07 ENCOUNTER — Other Ambulatory Visit: Payer: Self-pay

## 2023-12-07 ENCOUNTER — Inpatient Hospital Stay (HOSPITAL_COMMUNITY)
Admission: RE | Admit: 2023-12-07 | Discharge: 2023-12-12 | DRG: 654 | Disposition: A | Discharge status: Home Health | Payer: Medicare HMO | Attending: Urology | Admitting: Urology

## 2023-12-07 DIAGNOSIS — Z7901 Long term (current) use of anticoagulants: Secondary | ICD-10-CM

## 2023-12-07 DIAGNOSIS — Z6825 Body mass index (BMI) 25.0-25.9, adult: Secondary | ICD-10-CM

## 2023-12-07 DIAGNOSIS — Z85828 Personal history of other malignant neoplasm of skin: Secondary | ICD-10-CM

## 2023-12-07 DIAGNOSIS — E785 Hyperlipidemia, unspecified: Secondary | ICD-10-CM | POA: Diagnosis present

## 2023-12-07 DIAGNOSIS — N401 Enlarged prostate with lower urinary tract symptoms: Secondary | ICD-10-CM | POA: Diagnosis present

## 2023-12-07 DIAGNOSIS — F17290 Nicotine dependence, other tobacco product, uncomplicated: Secondary | ICD-10-CM | POA: Diagnosis present

## 2023-12-07 DIAGNOSIS — C661 Malignant neoplasm of right ureter: Secondary | ICD-10-CM | POA: Diagnosis present

## 2023-12-07 DIAGNOSIS — E1142 Type 2 diabetes mellitus with diabetic polyneuropathy: Secondary | ICD-10-CM | POA: Diagnosis present

## 2023-12-07 DIAGNOSIS — C678 Malignant neoplasm of overlapping sites of bladder: Secondary | ICD-10-CM | POA: Diagnosis not present

## 2023-12-07 DIAGNOSIS — Z79899 Other long term (current) drug therapy: Secondary | ICD-10-CM | POA: Diagnosis not present

## 2023-12-07 DIAGNOSIS — Z7985 Long-term (current) use of injectable non-insulin antidiabetic drugs: Secondary | ICD-10-CM | POA: Diagnosis not present

## 2023-12-07 DIAGNOSIS — Z8616 Personal history of COVID-19: Secondary | ICD-10-CM | POA: Diagnosis not present

## 2023-12-07 DIAGNOSIS — C679 Malignant neoplasm of bladder, unspecified: Secondary | ICD-10-CM | POA: Diagnosis not present

## 2023-12-07 DIAGNOSIS — C641 Malignant neoplasm of right kidney, except renal pelvis: Secondary | ICD-10-CM | POA: Diagnosis not present

## 2023-12-07 DIAGNOSIS — Z794 Long term (current) use of insulin: Secondary | ICD-10-CM | POA: Diagnosis not present

## 2023-12-07 DIAGNOSIS — I10 Essential (primary) hypertension: Secondary | ICD-10-CM | POA: Diagnosis not present

## 2023-12-07 DIAGNOSIS — N2881 Hypertrophy of kidney: Secondary | ICD-10-CM | POA: Diagnosis present

## 2023-12-07 DIAGNOSIS — Z860100 Personal history of colon polyps, unspecified: Secondary | ICD-10-CM | POA: Diagnosis not present

## 2023-12-07 DIAGNOSIS — M1611 Unilateral primary osteoarthritis, right hip: Secondary | ICD-10-CM | POA: Diagnosis not present

## 2023-12-07 DIAGNOSIS — E663 Overweight: Secondary | ICD-10-CM | POA: Diagnosis present

## 2023-12-07 DIAGNOSIS — Z7984 Long term (current) use of oral hypoglycemic drugs: Secondary | ICD-10-CM

## 2023-12-07 DIAGNOSIS — Z888 Allergy status to other drugs, medicaments and biological substances status: Secondary | ICD-10-CM | POA: Diagnosis not present

## 2023-12-07 DIAGNOSIS — R351 Nocturia: Secondary | ICD-10-CM | POA: Diagnosis present

## 2023-12-07 DIAGNOSIS — I480 Paroxysmal atrial fibrillation: Secondary | ICD-10-CM | POA: Diagnosis not present

## 2023-12-07 DIAGNOSIS — C773 Secondary and unspecified malignant neoplasm of axilla and upper limb lymph nodes: Secondary | ICD-10-CM | POA: Diagnosis not present

## 2023-12-07 DIAGNOSIS — C651 Malignant neoplasm of right renal pelvis: Secondary | ICD-10-CM | POA: Diagnosis not present

## 2023-12-07 LAB — TYPE AND SCREEN
ABO/RH(D): O POS
Antibody Screen: NEGATIVE

## 2023-12-07 LAB — CBC
HCT: 41.6 % (ref 39.0–52.0)
Hemoglobin: 14.1 g/dL (ref 13.0–17.0)
MCH: 30.6 pg (ref 26.0–34.0)
MCHC: 33.9 g/dL (ref 30.0–36.0)
MCV: 90.2 fL (ref 80.0–100.0)
Platelets: 154 10*3/uL (ref 150–400)
RBC: 4.61 MIL/uL (ref 4.22–5.81)
RDW: 13.3 % (ref 11.5–15.5)
WBC: 9 10*3/uL (ref 4.0–10.5)
nRBC: 0 % (ref 0.0–0.2)

## 2023-12-07 LAB — COMPREHENSIVE METABOLIC PANEL
ALT: 13 U/L (ref 0–44)
AST: 15 U/L (ref 15–41)
Albumin: 4.1 g/dL (ref 3.5–5.0)
Alkaline Phosphatase: 62 U/L (ref 38–126)
Anion gap: 8 (ref 5–15)
BUN: 26 mg/dL — ABNORMAL HIGH (ref 8–23)
CO2: 22 mmol/L (ref 22–32)
Calcium: 9 mg/dL (ref 8.9–10.3)
Chloride: 106 mmol/L (ref 98–111)
Creatinine, Ser: 0.91 mg/dL (ref 0.61–1.24)
GFR, Estimated: >60 mL/min (ref >60)
Glucose, Bld: 110 mg/dL — ABNORMAL HIGH (ref 70–99)
Potassium: 3.8 mmol/L (ref 3.5–5.1)
Sodium: 136 mmol/L (ref 135–145)
Total Bilirubin: 0.9 mg/dL (ref 0.0–1.2)
Total Protein: 7.5 g/dL (ref 6.5–8.1)

## 2023-12-07 LAB — ABO/RH: ABO/RH(D): O POS

## 2023-12-07 LAB — GLUCOSE, CAPILLARY: Glucose-Capillary: 111 mg/dL — ABNORMAL HIGH (ref 70–99)

## 2023-12-07 LAB — SURGICAL PCR SCREEN
MRSA, PCR: NEGATIVE
Staphylococcus aureus: POSITIVE — AB

## 2023-12-07 MED ORDER — MUPIROCIN 2 % EX OINT
1.0000 | TOPICAL_OINTMENT | Freq: Two times a day (BID) | CUTANEOUS | Status: DC
  Administered 2023-12-07 – 2023-12-11 (×8): 1 via NASAL
  Filled 2023-12-07: qty 22

## 2023-12-07 MED ORDER — METRONIDAZOLE 500 MG PO TABS
500.0000 mg | ORAL_TABLET | ORAL | Status: AC
  Administered 2023-12-07 – 2023-12-08 (×2): 500 mg via ORAL
  Filled 2023-12-07 (×2): qty 1

## 2023-12-07 MED ORDER — INSULIN ASPART 100 UNIT/ML IJ SOLN
0.0000 [IU] | Freq: Three times a day (TID) | INTRAMUSCULAR | Status: DC
  Administered 2023-12-08: 8 [IU] via SUBCUTANEOUS
  Administered 2023-12-09 – 2023-12-10 (×3): 3 [IU] via SUBCUTANEOUS
  Administered 2023-12-10 – 2023-12-11 (×3): 8 [IU] via SUBCUTANEOUS
  Administered 2023-12-11 – 2023-12-12 (×2): 3 [IU] via SUBCUTANEOUS

## 2023-12-07 MED ORDER — PIPERACILLIN-TAZOBACTAM 3.375 G IVPB 30 MIN
3.3750 g | Freq: Once | INTRAVENOUS | Status: AC
  Administered 2023-12-08: 3.375 g via INTRAVENOUS
  Filled 2023-12-07: qty 50

## 2023-12-07 MED ORDER — AMLODIPINE BESYLATE 5 MG PO TABS
5.0000 mg | ORAL_TABLET | Freq: Every day | ORAL | Status: DC
  Administered 2023-12-09 – 2023-12-12 (×4): 5 mg via ORAL
  Filled 2023-12-07 (×5): qty 1

## 2023-12-07 MED ORDER — PEG 3350-KCL-NA BICARB-NACL 420 G PO SOLR
4000.0000 mL | Freq: Once | ORAL | Status: AC
  Administered 2023-12-07: 4000 mL via ORAL

## 2023-12-07 MED ORDER — ATORVASTATIN CALCIUM 40 MG PO TABS
40.0000 mg | ORAL_TABLET | Freq: Every day | ORAL | Status: DC
  Administered 2023-12-07 – 2023-12-11 (×5): 40 mg via ORAL
  Filled 2023-12-07 (×5): qty 1

## 2023-12-07 MED ORDER — ALVIMOPAN 12 MG PO CAPS
12.0000 mg | ORAL_CAPSULE | ORAL | Status: AC
  Administered 2023-12-08: 12 mg via ORAL
  Filled 2023-12-07: qty 1

## 2023-12-07 MED ORDER — SODIUM CHLORIDE 0.9% FLUSH: 10.0000 mL | INTRAVENOUS | Status: DC | PRN

## 2023-12-07 MED ORDER — NEOMYCIN SULFATE 500 MG PO TABS
500.0000 mg | ORAL_TABLET | ORAL | Status: AC
  Administered 2023-12-07 – 2023-12-08 (×2): 500 mg via ORAL
  Filled 2023-12-07 (×2): qty 1

## 2023-12-07 MED ORDER — CHLORHEXIDINE GLUCONATE CLOTH 2 % EX PADS
6.0000 | MEDICATED_PAD | Freq: Every day | CUTANEOUS | Status: DC
  Administered 2023-12-07 – 2023-12-11 (×4): 6 via TOPICAL

## 2023-12-07 NOTE — Progress Notes (Signed)
Pt arrived to 1404. Pt educated on safety and valuables protocol. Pt also oriented to room. Pt verbalized understanding of all.   Dr Berneice Heinrich paged to notify of pt arrival. Call back received within minutes. Dr Berneice Heinrich stated he will look over current orders and place new orders within the next few hours as he is able to get to a computer. Per Dr Berneice Heinrich, pt can have a clear liquid diet ordered and will require his implanted port to be accessed along with a peripheral IV placed. Also, per Dr Berneice Heinrich, start the GI prep as soon as possible.

## 2023-12-08 ENCOUNTER — Other Ambulatory Visit: Payer: Self-pay

## 2023-12-08 ENCOUNTER — Encounter (HOSPITAL_COMMUNITY)
Admission: RE | Disposition: A | Discharge status: Home Health | Payer: Self-pay | Source: Home / Self Care | Attending: Urology

## 2023-12-08 ENCOUNTER — Encounter (HOSPITAL_COMMUNITY): Payer: Self-pay | Admitting: Urology

## 2023-12-08 ENCOUNTER — Inpatient Hospital Stay (HOSPITAL_COMMUNITY): Payer: Medicare HMO

## 2023-12-08 ENCOUNTER — Inpatient Hospital Stay (HOSPITAL_COMMUNITY): Payer: Medicare HMO | Admitting: Certified Registered"

## 2023-12-08 DIAGNOSIS — C651 Malignant neoplasm of right renal pelvis: Secondary | ICD-10-CM

## 2023-12-08 HISTORY — PX: ROBOT LAP RADICAL CYSTOPROSTATECTOMY PELVIC LYMPHADENECTOMY, NEOBLADDER: SHX7395

## 2023-12-08 HISTORY — PX: ROBOT ASSITED LAPAROSCOPIC NEPHROURETERECTOMY: SHX6077

## 2023-12-08 HISTORY — PX: CYSTOSCOPY W/ RETROGRADES: SHX1426

## 2023-12-08 HISTORY — PX: TOTAL URETHRECTOMY,RADICAL: SHX6666

## 2023-12-08 LAB — HEMOGLOBIN AND HEMATOCRIT, BLOOD
HCT: 39.5 % (ref 39.0–52.0)
Hemoglobin: 12.9 g/dL — ABNORMAL LOW (ref 13.0–17.0)

## 2023-12-08 LAB — GLUCOSE, CAPILLARY
Glucose-Capillary: 131 mg/dL — ABNORMAL HIGH (ref 70–99)
Glucose-Capillary: 223 mg/dL — ABNORMAL HIGH (ref 70–99)
Glucose-Capillary: 282 mg/dL — ABNORMAL HIGH (ref 70–99)
Glucose-Capillary: 197 mg/dL — ABNORMAL HIGH (ref 70–99)

## 2023-12-08 SURGERY — CYSTOSCOPY, WITH RETROGRADE PYELOGRAM
Anesthesia: General | Site: Scrotum | Laterality: Right

## 2023-12-08 MED ORDER — DEXAMETHASONE SODIUM PHOSPHATE 10 MG/ML IJ SOLN
INTRAMUSCULAR | Status: DC | PRN
  Administered 2023-12-08: 5 mg via INTRAVENOUS

## 2023-12-08 MED ORDER — ROCURONIUM BROMIDE 10 MG/ML (PF) SYRINGE
PREFILLED_SYRINGE | INTRAVENOUS | Status: AC
  Filled 2023-12-08: qty 10

## 2023-12-08 MED ORDER — ACETAMINOPHEN 500 MG PO TABS: 1000.0000 mg | ORAL_TABLET | Freq: Once | ORAL | Status: DC | PRN

## 2023-12-08 MED ORDER — OXYCODONE HCL 5 MG/5ML PO SOLN: 5.0000 mg | Freq: Once | ORAL | Status: DC | PRN

## 2023-12-08 MED ORDER — LIDOCAINE 2% (20 MG/ML) 5 ML SYRINGE
INTRAMUSCULAR | Status: DC | PRN
  Administered 2023-12-08: 70 mg via INTRAVENOUS

## 2023-12-08 MED ORDER — DIPHENHYDRAMINE HCL 12.5 MG/5ML PO ELIX: 12.5000 mg | ORAL_SOLUTION | Freq: Four times a day (QID) | ORAL | Status: DC | PRN

## 2023-12-08 MED ORDER — ROCURONIUM BROMIDE 10 MG/ML (PF) SYRINGE
PREFILLED_SYRINGE | INTRAVENOUS | Status: DC | PRN
  Administered 2023-12-08: 30 mg via INTRAVENOUS
  Administered 2023-12-08: 90 mg via INTRAVENOUS
  Administered 2023-12-08: 40 mg via INTRAVENOUS
  Administered 2023-12-08: 20 mg via INTRAVENOUS

## 2023-12-08 MED ORDER — ACETAMINOPHEN 10 MG/ML IV SOLN
INTRAVENOUS | Status: DC | PRN
  Administered 2023-12-08: 1000 mg via INTRAVENOUS

## 2023-12-08 MED ORDER — DIPHENHYDRAMINE HCL 50 MG/ML IJ SOLN: 12.5000 mg | Freq: Four times a day (QID) | INTRAMUSCULAR | Status: DC | PRN

## 2023-12-08 MED ORDER — BUPIVACAINE LIPOSOME 1.3 % IJ SUSP
INTRAMUSCULAR | Status: AC
Start: 2023-12-08 — End: ?
  Filled 2023-12-08: qty 20

## 2023-12-08 MED ORDER — ACETAMINOPHEN 10 MG/ML IV SOLN: 1000.0000 mg | Freq: Once | INTRAVENOUS | Status: DC | PRN

## 2023-12-08 MED ORDER — SODIUM CHLORIDE (PF) 0.9 % IJ SOLN
INTRAMUSCULAR | Status: DC | PRN
  Administered 2023-12-08: 20 mL

## 2023-12-08 MED ORDER — PHENYLEPHRINE HCL (PRESSORS) 10 MG/ML IV SOLN
INTRAVENOUS | Status: AC
  Filled 2023-12-08: qty 1

## 2023-12-08 MED ORDER — ACETAMINOPHEN 10 MG/ML IV SOLN
1000.0000 mg | Freq: Four times a day (QID) | INTRAVENOUS | Status: AC
  Administered 2023-12-08 – 2023-12-09 (×4): 1000 mg via INTRAVENOUS
  Filled 2023-12-08 (×4): qty 100

## 2023-12-08 MED ORDER — SUGAMMADEX SODIUM 200 MG/2ML IV SOLN: INTRAVENOUS | Status: DC | PRN

## 2023-12-08 MED ORDER — STERILE WATER FOR IRRIGATION IR SOLN
Status: DC | PRN
Start: 2023-12-08 — End: 2023-12-08
  Administered 2023-12-08: 1000 mL

## 2023-12-08 MED ORDER — SODIUM CHLORIDE (PF) 0.9 % IJ SOLN
INTRAMUSCULAR | Status: AC
  Filled 2023-12-08: qty 20

## 2023-12-08 MED ORDER — DEXAMETHASONE SODIUM PHOSPHATE 10 MG/ML IJ SOLN
INTRAMUSCULAR | Status: AC
  Filled 2023-12-08: qty 1

## 2023-12-08 MED ORDER — PHENYLEPHRINE HCL-NACL 20-0.9 MG/250ML-% IV SOLN
INTRAVENOUS | Status: DC | PRN
  Administered 2023-12-08: 100 ug via INTRAVENOUS
  Administered 2023-12-08: 50 ug/min via INTRAVENOUS

## 2023-12-08 MED ORDER — ONDANSETRON HCL 4 MG/2ML IJ SOLN
INTRAMUSCULAR | Status: DC | PRN
  Administered 2023-12-08: 4 mg via INTRAVENOUS

## 2023-12-08 MED ORDER — PIPERACILLIN-TAZOBACTAM 3.375 G IVPB 30 MIN: 3.3750 g | Freq: Three times a day (TID) | INTRAVENOUS | Status: DC

## 2023-12-08 MED ORDER — SODIUM CHLORIDE 0.9 % IR SOLN
Status: DC | PRN
  Administered 2023-12-08: 1000 mL

## 2023-12-08 MED ORDER — FENTANYL CITRATE PF 50 MCG/ML IJ SOSY
25.0000 ug | PREFILLED_SYRINGE | INTRAMUSCULAR | Status: DC | PRN
  Administered 2023-12-08: 50 ug via INTRAVENOUS

## 2023-12-08 MED ORDER — LACTATED RINGERS IV SOLN
INTRAVENOUS | Status: DC | PRN
Start: 2023-12-08 — End: 2023-12-08

## 2023-12-08 MED ORDER — LACTATED RINGERS IV SOLN: INTRAVENOUS | Status: DC

## 2023-12-08 MED ORDER — INSULIN ASPART 100 UNIT/ML IJ SOLN
3.0000 [IU] | Freq: Once | INTRAMUSCULAR | Status: AC
  Administered 2023-12-08: 3 [IU] via SUBCUTANEOUS

## 2023-12-08 MED ORDER — PROPOFOL 10 MG/ML IV BOLUS
INTRAVENOUS | Status: AC
  Filled 2023-12-08: qty 20

## 2023-12-08 MED ORDER — STERILE WATER FOR INJECTION IJ SOLN
INTRAMUSCULAR | Status: DC | PRN
  Administered 2023-12-08: 10 mL

## 2023-12-08 MED ORDER — OXYCODONE HCL 5 MG PO TABS: 5.0000 mg | ORAL_TABLET | Freq: Once | ORAL | Status: DC | PRN

## 2023-12-08 MED ORDER — ONDANSETRON HCL 4 MG/2ML IJ SOLN: 4.0000 mg | INTRAMUSCULAR | Status: DC | PRN

## 2023-12-08 MED ORDER — INSULIN ASPART 100 UNIT/ML IJ SOLN
INTRAMUSCULAR | Status: AC
  Filled 2023-12-08: qty 1

## 2023-12-08 MED ORDER — DOCUSATE SODIUM 100 MG PO CAPS
100.0000 mg | ORAL_CAPSULE | Freq: Two times a day (BID) | ORAL | Status: DC
  Administered 2023-12-08 – 2023-12-11 (×7): 100 mg via ORAL
  Filled 2023-12-08 (×8): qty 1

## 2023-12-08 MED ORDER — HYDROMORPHONE HCL 1 MG/ML IJ SOLN
0.5000 mg | INTRAMUSCULAR | Status: DC | PRN
  Administered 2023-12-08: 1 mg via INTRAVENOUS
  Filled 2023-12-08: qty 1

## 2023-12-08 MED ORDER — SODIUM CHLORIDE 0.9 % IV SOLN: INTRAVENOUS | Status: AC

## 2023-12-08 MED ORDER — FENTANYL CITRATE (PF) 100 MCG/2ML IJ SOLN
INTRAMUSCULAR | Status: DC | PRN
Start: 2023-12-08 — End: 2023-12-08
  Administered 2023-12-08 (×2): 25 ug via INTRAVENOUS
  Administered 2023-12-08: 150 ug via INTRAVENOUS
  Administered 2023-12-08: 50 ug via INTRAVENOUS

## 2023-12-08 MED ORDER — PROPOFOL 10 MG/ML IV BOLUS
INTRAVENOUS | Status: DC | PRN
  Administered 2023-12-08: 200 mg via INTRAVENOUS

## 2023-12-08 MED ORDER — HYDROMORPHONE HCL 2 MG/ML IJ SOLN
INTRAMUSCULAR | Status: AC
  Filled 2023-12-08: qty 1

## 2023-12-08 MED ORDER — INSULIN ASPART 100 UNIT/ML IJ SOLN: 0.0000 [IU] | INTRAMUSCULAR | Status: DC | PRN

## 2023-12-08 MED ORDER — GLYCOPYRROLATE PF 0.2 MG/ML IJ SOSY
PREFILLED_SYRINGE | INTRAMUSCULAR | Status: DC | PRN
Start: 2023-12-08 — End: 2023-12-08
  Administered 2023-12-08: .2 mg via INTRAVENOUS

## 2023-12-08 MED ORDER — ACETAMINOPHEN 10 MG/ML IV SOLN
INTRAVENOUS | Status: AC
  Filled 2023-12-08: qty 100

## 2023-12-08 MED ORDER — ONDANSETRON HCL 4 MG/2ML IJ SOLN
INTRAMUSCULAR | Status: AC
  Filled 2023-12-08: qty 2

## 2023-12-08 MED ORDER — PIPERACILLIN-TAZOBACTAM 3.375 G IVPB
3.3750 g | Freq: Three times a day (TID) | INTRAVENOUS | Status: AC
  Administered 2023-12-08 – 2023-12-09 (×3): 3.375 g via INTRAVENOUS
  Filled 2023-12-08 (×3): qty 50

## 2023-12-08 MED ORDER — CHLORHEXIDINE GLUCONATE 0.12 % MT SOLN
15.0000 mL | Freq: Once | OROMUCOSAL | Status: AC
  Administered 2023-12-08: 15 mL via OROMUCOSAL

## 2023-12-08 MED ORDER — BUPIVACAINE LIPOSOME 1.3 % IJ SUSP
INTRAMUSCULAR | Status: DC | PRN
  Administered 2023-12-08: 20 mL

## 2023-12-08 MED ORDER — HYDROMORPHONE HCL 1 MG/ML IJ SOLN
INTRAMUSCULAR | Status: DC | PRN
  Administered 2023-12-08: .8 mg via INTRAVENOUS
  Administered 2023-12-08 (×3): .4 mg via INTRAVENOUS

## 2023-12-08 MED ORDER — FENTANYL CITRATE PF 50 MCG/ML IJ SOSY
PREFILLED_SYRINGE | INTRAMUSCULAR | Status: AC
  Administered 2023-12-08: 50 ug via INTRAVENOUS
  Filled 2023-12-08: qty 2

## 2023-12-08 MED ORDER — OXYCODONE HCL 5 MG PO TABS
5.0000 mg | ORAL_TABLET | ORAL | Status: DC | PRN
  Administered 2023-12-09 – 2023-12-10 (×3): 5 mg via ORAL
  Filled 2023-12-08 (×3): qty 1

## 2023-12-08 MED ORDER — SUGAMMADEX SODIUM 200 MG/2ML IV SOLN
INTRAVENOUS | Status: DC | PRN
  Administered 2023-12-08: 200 mg via INTRAVENOUS

## 2023-12-08 MED ORDER — EPHEDRINE SULFATE-NACL 50-0.9 MG/10ML-% IV SOSY
PREFILLED_SYRINGE | INTRAVENOUS | Status: DC | PRN
  Administered 2023-12-08: 7.5 mg via INTRAVENOUS
  Administered 2023-12-08: 5 mg via INTRAVENOUS

## 2023-12-08 MED ORDER — ACETAMINOPHEN 160 MG/5ML PO SOLN: 1000.0000 mg | Freq: Once | ORAL | Status: DC | PRN

## 2023-12-08 MED ORDER — FENTANYL CITRATE (PF) 250 MCG/5ML IJ SOLN
INTRAMUSCULAR | Status: AC
  Filled 2023-12-08: qty 5

## 2023-12-08 SURGICAL SUPPLY — 119 items
BAG COUNTER SPONGE SURGICOUNT (BAG) IMPLANT
BAG LAPAROSCOPIC 12 15 PORT 16 (BASKET) ×5 IMPLANT
BAG RETRIEVAL 12/15 (BASKET) ×10
BAG URO CATCHER STRL LF (MISCELLANEOUS) ×5 IMPLANT
BLADE EXTENDED COATED 6.5IN (ELECTRODE) ×5 IMPLANT
BLADE SURG 15 STRL LF DISP TIS (BLADE) IMPLANT
CATH FOLEY 2WAY SLVR 30CC 24FR (CATHETERS) IMPLANT
CATH FOLEY 2WAY SLVR 5CC 16FR (CATHETERS) IMPLANT
CATH URETL OPEN END 6FR 70 (CATHETERS) IMPLANT
CHLORAPREP W/TINT 26 (MISCELLANEOUS) ×5 IMPLANT
CLIP LIGATING HEM O LOK PURPLE (MISCELLANEOUS) ×20 IMPLANT
CLIP LIGATING HEMO LOK XL GOLD (MISCELLANEOUS) ×5 IMPLANT
CLIP LIGATING HEMO O LOK GREEN (MISCELLANEOUS) ×20 IMPLANT
CLOTH BEACON ORANGE TIMEOUT ST (SAFETY) ×5 IMPLANT
COVER BACK TABLE 60X90IN (DRAPES) ×5 IMPLANT
COVER MAYO STAND XLG (MISCELLANEOUS) IMPLANT
COVER SURGICAL LIGHT HANDLE (MISCELLANEOUS) ×5 IMPLANT
COVER TIP SHEARS 8 DVNC (MISCELLANEOUS) ×5 IMPLANT
CUTTER ECHEON FLEX ENDO 45 340 (ENDOMECHANICALS) IMPLANT
DERMABOND ADVANCED .7 DNX12 (GAUZE/BANDAGES/DRESSINGS) ×10 IMPLANT
DISSECTOR ROUND CHERRY 3/8 STR (MISCELLANEOUS) IMPLANT
DRAIN CHANNEL 15F RND FF 3/16 (WOUND CARE) IMPLANT
DRAIN CHANNEL RND F F (WOUND CARE) IMPLANT
DRAPE ARM DVNC X/XI (DISPOSABLE) ×20 IMPLANT
DRAPE COLUMN DVNC XI (DISPOSABLE) ×5 IMPLANT
DRAPE INCISE IOBAN 66X45 STRL (DRAPES) ×5 IMPLANT
DRAPE LAPAROSCOPIC ABDOMINAL (DRAPES) IMPLANT
DRAPE SHEET LG 3/4 BI-LAMINATE (DRAPES) ×5 IMPLANT
DRAPE WARM FLUID 44X44 (DRAPES) ×5 IMPLANT
DRIVER NDL LRG 8 DVNC XI (INSTRUMENTS) ×10 IMPLANT
DRIVER NDLE LRG 8 DVNC XI (INSTRUMENTS) ×10 IMPLANT
ELECT PENCIL ROCKER SW 15FT (MISCELLANEOUS) ×5 IMPLANT
ELECT REM PT RETURN 15FT ADLT (MISCELLANEOUS) ×5 IMPLANT
EVACUATOR SILICONE 100CC (DRAIN) IMPLANT
FORCEPS BPLR FENES DVNC XI (FORCEP) ×5 IMPLANT
FORCEPS PROGRASP DVNC XI (FORCEP) ×5 IMPLANT
GAUZE 4X4 16PLY ~~LOC~~+RFID DBL (SPONGE) ×5 IMPLANT
GLOVE BIO SURGEON STRL SZ 6.5 (GLOVE) ×5 IMPLANT
GLOVE SURG LX STRL 7.5 STRW (GLOVE) ×10 IMPLANT
GOWN SRG XL LVL 4 BRTHBL STRL (GOWNS) ×5 IMPLANT
GOWN STRL REUS W/ TWL XL LVL3 (GOWN DISPOSABLE) ×10 IMPLANT
GOWN STRL SURGICAL XL XLNG (GOWN DISPOSABLE) ×5 IMPLANT
GUIDEWIRE STR DUAL SENSOR (WIRE) ×5 IMPLANT
IRRIG SUCT STRYKERFLOW 2 WTIP (MISCELLANEOUS) ×10
IRRIGATION SUCT STRKRFLW 2 WTP (MISCELLANEOUS) ×5 IMPLANT
KIT BASIN OR (CUSTOM PROCEDURE TRAY) ×5 IMPLANT
KIT PROCEDURE DVNC SI (MISCELLANEOUS) IMPLANT
KIT TURNOVER KIT A (KITS) IMPLANT
LOOP VESSEL MAXI BLUE (MISCELLANEOUS) ×5 IMPLANT
MANIFOLD NEPTUNE II (INSTRUMENTS) ×5 IMPLANT
MARKER SKIN DUAL TIP RULER LAB (MISCELLANEOUS) ×5 IMPLANT
NDL ASPIRATION 22 (NEEDLE) IMPLANT
NDL INSUFFLATION 14GA 120MM (NEEDLE) ×5 IMPLANT
NEEDLE ASPIRATION 22 (NEEDLE) ×5 IMPLANT
NEEDLE INSUFFLATION 14GA 120MM (NEEDLE) ×5 IMPLANT
NS IRRIG 1000ML POUR BTL (IV SOLUTION) ×10 IMPLANT
PACK CYSTO (CUSTOM PROCEDURE TRAY) ×5 IMPLANT
PACK GENERAL/GYN (CUSTOM PROCEDURE TRAY) ×5 IMPLANT
PLUG CATH AND CAP STRL 200 (CATHETERS) ×5 IMPLANT
PORT ACCESS TROCAR AIRSEAL 12 (TROCAR) ×5 IMPLANT
PROTECTOR NERVE ULNAR (MISCELLANEOUS) ×10 IMPLANT
RELOAD PROXIMATE 75MM BLUE (ENDOMECHANICALS) IMPLANT
RELOAD STAPLE 45 2.6 WHT THIN (STAPLE) IMPLANT
RELOAD STAPLE 60 2.6 WHT THN (STAPLE) IMPLANT
RELOAD STAPLE 60 4.1 GRN THCK (STAPLE) IMPLANT
RELOAD STAPLE 75 3.8 BLU REG (ENDOMECHANICALS) IMPLANT
RELOAD STAPLER GREEN 60MM (STAPLE) ×10 IMPLANT
RELOAD STAPLER WHITE 60MM (STAPLE) ×50 IMPLANT
RETRACTOR LONRSTAR 16.6X16.6CM (MISCELLANEOUS) IMPLANT
RETRACTOR STAY HOOK 5MM (MISCELLANEOUS) IMPLANT
RETRACTOR STER APS 16.6X16.6CM (MISCELLANEOUS) ×5
SCISSORS LAP 5X45 EPIX DISP (ENDOMECHANICALS) IMPLANT
SCISSORS MNPLR CVD DVNC XI (INSTRUMENTS) ×5 IMPLANT
SEAL UNIV 5-12 XI (MISCELLANEOUS) ×20 IMPLANT
SET IRRIG Y TYPE TUR BLADDER L (SET/KITS/TRAYS/PACK) IMPLANT
SET TRI-LUMEN FLTR TB AIRSEAL (TUBING) ×5 IMPLANT
SHEET LAVH (DRAPES) IMPLANT
SOL ELECTROSURG ANTI STICK (MISCELLANEOUS) ×5
SOLUTION ELECTROSURG ANTI STCK (MISCELLANEOUS) ×5 IMPLANT
SPIKE FLUID TRANSFER (MISCELLANEOUS) ×5 IMPLANT
SPONGE T-LAP 4X18 ~~LOC~~+RFID (SPONGE) IMPLANT
STAPLE ECHEON FLEX 60 POW ENDO (STAPLE) IMPLANT
STAPLE RELOAD 45 WHT (STAPLE) IMPLANT
STAPLER GUN LINEAR PROX 60 (STAPLE) IMPLANT
STAPLER POWER ECHELON 45 WIDE (STAPLE) ×5 IMPLANT
STAPLER PROXIMATE 75MM BLUE (STAPLE) IMPLANT
STAPLER RELOAD GREEN 60MM (STAPLE) ×10
STAPLER RELOAD WHITE 60MM (STAPLE) ×50
STAPLER SKIN PROX WIDE 3.9 (STAPLE) IMPLANT
STENT SET URETHERAL LEFT 7FR (STENTS) ×5 IMPLANT
STENT SET URETHERAL RIGHT 7FR (STENTS) ×5 IMPLANT
SURGILUBE 2OZ TUBE FLIPTOP (MISCELLANEOUS) ×5 IMPLANT
SUT CHROMIC 4 0 RB 1X27 (SUTURE) ×5 IMPLANT
SUT ETHILON 3 0 PS 1 (SUTURE) ×5 IMPLANT
SUT MNCRL AB 4-0 PS2 18 (SUTURE) ×10 IMPLANT
SUT PDS AB 1 CT1 27 (SUTURE) ×15 IMPLANT
SUT SILK 0 30XBRD TIE 6 (SUTURE) ×5 IMPLANT
SUT SILK 3 0 SH CR/8 (SUTURE) ×5 IMPLANT
SUT VIC AB 2-0 SH 27X BRD (SUTURE) ×5 IMPLANT
SUT VIC AB 2-0 UR5 27 (SUTURE) ×20 IMPLANT
SUT VIC AB 2-0 UR6 27 (SUTURE) IMPLANT
SUT VIC AB 3-0 SH 18 (SUTURE) IMPLANT
SUT VIC AB 3-0 SH 27X BRD (SUTURE) ×5 IMPLANT
SUT VIC AB 3-0 SH 27XBRD (SUTURE) ×20 IMPLANT
SUT VIC AB 4-0 RB1 27XBRD (SUTURE) ×20 IMPLANT
SUT VICRYL 0 UR6 27IN ABS (SUTURE) IMPLANT
SUT VLOC BARB 180 ABS3/0GR12 (SUTURE) ×5
SUTURE VLOC BRB 180 ABS3/0GR12 (SUTURE) ×5 IMPLANT
SYR 10ML LL (SYRINGE) IMPLANT
SYR 20ML LL LF (SYRINGE) ×10 IMPLANT
SYSTEM UROSTOMY GENTLE TOUCH (WOUND CARE) ×5 IMPLANT
TOWEL OR 17X26 10 PK STRL BLUE (TOWEL DISPOSABLE) ×5 IMPLANT
TRAY FOLEY MTR SLVR 16FR STAT (SET/KITS/TRAYS/PACK) ×5 IMPLANT
TRAY LAPAROSCOPIC (CUSTOM PROCEDURE TRAY) ×5 IMPLANT
TROCAR Z THREAD OPTICAL 12X100 (TROCAR) ×5 IMPLANT
TROCAR Z-THREAD OPTICAL 5X100M (TROCAR) IMPLANT
TUBING CONNECTING 10 (TUBING) ×5 IMPLANT
WATER STERILE IRR 1000ML POUR (IV SOLUTION) ×5 IMPLANT
YANKAUER SUCT BULB TIP 10FT TU (MISCELLANEOUS) IMPLANT

## 2023-12-08 NOTE — Anesthesia Procedure Notes (Signed)
Procedure Name: Intubation Date/Time: 12/08/2023 7:34 AM  Performed by: Ponciano Ort, CRNAPre-anesthesia Checklist: Patient identified, Emergency Drugs available, Suction available and Patient being monitored Patient Re-evaluated:Patient Re-evaluated prior to induction Oxygen Delivery Method: Circle system utilized Preoxygenation: Pre-oxygenation with 100% oxygen Induction Type: IV induction Ventilation: Mask ventilation without difficulty and Oral airway inserted - appropriate to patient size Laryngoscope Size: Mac and 4 Grade View: Grade I Tube type: Oral Tube size: 7.5 mm Number of attempts: 1 Airway Equipment and Method: Stylet and Oral airway Placement Confirmation: ETT inserted through vocal cords under direct vision, positive ETCO2 and breath sounds checked- equal and bilateral Secured at: 23 cm Tube secured with: Tape Dental Injury: Teeth and Oropharynx as per pre-operative assessment

## 2023-12-08 NOTE — H&P (Addendum)
Robert [name] is an 73 y.o. male.    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 Mosetta Putt x 4 cycles until 2024. 05/2023 multifocal recurrence with high grade disease Rt renal pelvis + distal ureter + multifocal bladder + T1G3 prostatic urethra. CT chest/abd/pelvis 05/2023 clinically localized. Started on neo-adjuvant gem-cis under care of Dr Mosetta Putt completed 4 cycles 07/2023. Restaging imaging 08/2023 w/o overt distant disease. 3 artery / 2 vein right renovascualr anatomy. Baseline Cr <1.   PMH sig for AFib/Eliquus (follows [facility] [facility] cards), IDDM2 (A1c 6s, ozempic), Hip OA (eventual arhtroplasty after cancer treatments). HIs PCP is Sonda Primes MD. His youngest son died at age 60 from MM. 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 Wife with dementia and he has some hoem care to help. Son in [place] with grand-daughter expected fall 2024. Retired from [employer].   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 Completed bowel prep to clear. Hgb 11.5, Cr 0.9.  Cards clearance on file. Has held eliquus and GLP meds as instructed.   Past Medical History:  Diagnosis Date   Anticoagulant long-term use    eliquis--- managed by cardiology   Arthritis    Bladder cancer [facility])    urologist--- dr Mena Goes;  overlapping   History of basal cell carcinoma (BCC) excision    History of COVID-19 05/2019   per pt mild symptoms that resolved   HLD (hyperlipidemia)    HTN (hypertension)    Hx of colonic polyps    Insulin dependent type 2 diabetes mellitus [facility])    endocrinologist--- dr Elvera Lennox --   (03-11-2022 per pt check blood sugar 1-2 times daily,  fasting sugar-- 118-120s)   Nocturia    Paroxysmal atrial fibrillation (HCC) 03/12/2019   cardiologist--- dr Melburn Popper   Ureterocele    Use of cane as ambulatory aid     Past Surgical History:  Procedure Laterality Date   BLEPHAROPLASTY Bilateral    per pt approx  2001;   upper eyelid's   COLONOSCOPY     last one approx 2023   CYSTOSCOPY WITH URETEROSCOPY AND STENT PLACEMENT Right 12/02/2022   Procedure: CYSTOSCOPY WITH RIGHT URETEROSCOPY, RIGHT DIAGNOSTIC URETEROSCOPY;  Surgeon: Jerilee Field, MD;  Location: [facility];  Service: Urology;  Laterality: Right;  90 MINS FOR CASE   CYSTOSCOPY/RETROGRADE/URETEROSCOPY Bilateral 05/05/2023   Procedure: CYSTOSCOPY BILATERAL RETROGRADE PYELOGRAM RIGHT URETEROSCOPY RIGHT STENT PLACEMENT;  Surgeon: Jerilee Field, MD;  Location[facility];  Service: Urology;  Laterality: Bilateral;  90 MINS FOR CASE   IR IMAGING GUIDED PORT INSERTION  05/30/2023   PATELLAR TENDON REPAIR Left 04/16/2004   @[facility]    TRANSURETHRAL RESECTION OF BLADDER TUMOR N/A 06/18/2021   Procedure: TRANSURETHRAL RESECTION OF BLADDER TUMOR (TURBT) WITH RIGHT URETERAL STENT PLACEMENT, RIGHT URETEROSCOPY WITH DISTRACTION OF TUMOR, FULGURATION;  Surgeon: Jerilee Field, MD;  Location: [facility];  Service: Urology;  Laterality: N/A;   TRANSURETHRAL RESECTION OF BLADDER TUMOR N/A 03/15/2022   Procedure: TRANSURETHRAL RESECTION OF BLADDER TUMOR (TURBT) WITH BILATERAL URETEROSCOPY/URETHRAL/ DILATION/BILATERAL RETROGRADE PYELOGRAM/BIOPSY AND FULGURATION OF RIGHT URETERAL CANCER/RIGHT  STENT PLACEMENT;  Surgeon: Jerilee Field, MD;  Location: [facility];  Service: Urology;  Laterality: N/A;   TRANSURETHRAL RESECTION OF BLADDER TUMOR N/A 05/05/2023   Procedure: TRANSURETHRAL RESECTION OF BLADDER TUMOR (TURBT) BLADDER BIOSPY;  Surgeon: Jerilee Field, MD;  Location: [facility];  Service: Urology;  Laterality: N/A;   TRANSURETHRAL RESECTION OF BLADDER TUMOR WITH MITOMYCIN-C N/A 12/02/2022   Procedure: TRANSURETHRAL RESECTION OF BLADDER TUMOR;  Surgeon: Jerilee Field, MD;  Location: [facility];  Service: Urology;  Laterality: N/A;   TRANSURETHRAL RESECTION OF PROSTATE  05/05/2023   Procedure: TRANSURETHRAL RESECTION OF THE PROSTATE (TURP);  Surgeon: Jerilee Field, MD;  Location: [facility];  Service: Urology;;    Family History  Problem Relation Age of Onset   Pancreatic cancer Mother 48   Cancer Paternal Uncle        unknown type cancer   Aortic aneurysm Other        family history   Cancer Other        Breast (1st degree relative)   Colon cancer Neg Hx    Rectal cancer Neg Hx    Stomach cancer Neg Hx    Esophageal cancer Neg Hx    Social History:  reports that he has been smoking cigars. He has never used smokeless tobacco. He reports current alcohol use. He reports that he does not use drugs.  Allergies:  Allergies  Allergen Reactions   Lisinopril Swelling    Medications Prior to Admission  Medication Sig Dispense Refill   amLODipine (NORVASC) 5 MG tablet Take 1 tablet (5 mg total) by mouth daily. 30 tablet 0   atorvastatin (LIPITOR) 40 MG tablet TAKE ONE TABLET BY MOUTH ONCE DAILY (Patient taking differently: Take 40 mg by mouth at bedtime.) 90 tablet 1   dapagliflozin propanediol (FARXIGA) 10 MG TABS tablet Take 1 tablet (10 mg total) by mouth daily before breakfast. 90 tablet 3   ELIQUIS 5 MG TABS tablet TAKE ONE TABLET BY MOUTH TWICE DAILY 60 tablet 5   EPINEPHrine 0.3 mg/0.3 mL IJ SOAJ injection Inject 0.3 mg into the muscle as needed for anaphylaxis. 1 each 0   glipiZIDE (GLUCOTROL XL) 2.5 MG 24 hr tablet Take 2.5 mg by mouth daily with breakfast.     metFORMIN (GLUCOPHAGE-XR) 500 MG 24 hr tablet Take 4 tablets (2,000 mg total) by mouth daily with supper. (Patient taking differently: Take 1,000 mg by mouth daily with  supper.) 360 tablet 3   metoprolol succinate (TOPROL-XL) 25 MG 24 hr tablet Take 1 tablet (25 mg total) by mouth daily. 90 tablet 2   propranolol (INDERAL) 10 MG tablet TAKE 1 TABLET BY MOUTH 4 TIMES DAILY AS NEEDED FOR PALPITATIONS, IRREGULAR OR FAST HEART RATE 270 tablet 1   Semaglutide, 1 MG/DOSE, (OZEMPIC, 1 MG/DOSE,) 4 MG/3ML SOPN INJECT 1 MG UNDER THE SKIN ONCE A WEEK AS DIRECTED 9 mL 3   tadalafil (CIALIS) 5 MG tablet Take 1 tablet (5 mg total) by mouth daily. (Patient taking differently: Take 5 mg by mouth daily as needed for erectile dysfunction.) 30 tablet 11   traMADol (ULTRAM) 50 MG tablet Take 1 tablet (50 mg total) by mouth every 6 (six) hours as needed for severe pain. 60 tablet 2   B Complex-C (B-COMPLEX WITH VITAMIN C) tablet Take 1 tablet by mouth daily.     Cholecalciferol (VITAMIN D-3 PO) Take 1 tablet by mouth daily.     empagliflozin (JARDIANCE) 10 MG TABS  tablet Take 1 tablet (10 mg total) by mouth daily before breakfast. 90 tablet 1   glucose blood test strip 1 each by Other route daily. Use daily for glucose control - Dx 250.00 100 each 0    Results for orders placed or performed during the hospital encounter of 12/07/23 (from the past 48 hours)  CBC     Status: None   Collection Time: 12/07/23  4:16 PM  Result Value Ref Range   WBC 9.0 4.0 - 10.5 K/uL   RBC 4.61 4.22 - 5.81 MIL/uL   Hemoglobin 14.1 13.0 - 17.0 g/dL   HCT 16.1 09.6 - 04.5 %   MCV 90.2 80.0 - 100.0 fL   MCH 30.6 26.0 - 34.0 pg   MCHC 33.9 30.0 - 36.0 g/dL   RDW 40.9 81.1 - 91.4 %   Platelets 154 150 - 400 K/uL   nRBC 0.0 0.0 - 0.2 %    Comment: Performed at [facility], [address] [place], [place] [address]  Comprehensive metabolic panel     Status: Abnormal   Collection Time: 12/07/23  4:16 PM  Result Value Ref Range   Sodium 136 135 - 145 mmol/L   Potassium 3.8 3.5 - 5.1 mmol/L   Chloride 106 98 - 111 mmol/L   CO2 22 22 - 32 mmol/L   Glucose, Bld 110 (H) 70 - 99  mg/dL    Comment: Glucose reference range applies only to samples taken after fasting for at least 8 hours.   BUN 26 (H) 8 - 23 mg/dL   Creatinine, Ser 6.21 0.61 - 1.24 mg/dL   Calcium 9.0 8.9 - 30.8 mg/dL   Total Protein 7.5 6.5 - 8.1 g/dL   Albumin 4.1 3.5 - 5.0 g/dL   AST 15 15 - 41 U/L   ALT 13 0 - 44 U/L   Alkaline Phosphatase 62 38 - 126 U/L   Total Bilirubin 0.9 0.0 - 1.2 mg/dL   GFR, Estimated >65 >78 mL/min    Comment: (NOTE) Calculated using the CKD-EPI Creatinine Equation (2021)    Anion gap 8 5 - 15    Comment: Performed at [facility], [address] [place], [place] [address]  Type and screen     Status: None   Collection Time: 12/07/23  4:16 PM  Result Value Ref Range   ABO/RH(D) O POS    Antibody Screen NEG    Sample Expiration      12/10/2023,2359 Performed at [facility], [address], [place], [place] [address]   ABO/Rh     Status: None   Collection Time: 12/07/23  6:41 PM  Result Value Ref Range   ABO/RH(D)      O POS Performed at [facility], [address] [place], [place] [address]   Surgical PCR screen     Status: Abnormal   Collection Time: 12/07/23  8:29 PM   Specimen: Nasal Mucosa; Nasal Swab  Result Value Ref Range   MRSA, PCR NEGATIVE NEGATIVE   Staphylococcus aureus POSITIVE (A) NEGATIVE    Comment: (NOTE) The Xpert SA Assay (FDA approved for NASAL specimens in patients 57 years of age and older), is one component of a comprehensive surveillance program. It is not intended to diagnose infection nor to guide or monitor treatment. Performed at [facility], [address], [place], [place] [address]   Glucose, capillary     Status: Abnormal   Collection Time:  12/07/23 10:28 PM  Result Value Ref Range   Glucose-Capillary 111 (H) 70 - 99 mg/dL    Comment: Glucose reference range applies only to samples taken after fasting for at least 8 hours.   No results  found.  Review of Systems  Constitutional:  Negative for chills and fever.  All other systems reviewed and are negative.   Blood pressure 127/79, pulse 64, temperature 97.9 F (36.6 C), temperature source Oral, resp. rate 20, height 6\' 2"  (1.88 m), weight 89.7 kg, SpO2 97%. Physical Exam Vitals reviewed.  HENT:     Head: Normocephalic.  Eyes:     Pupils: Pupils are equal, round, and reactive to light.  Cardiovascular:     Rate and Rhythm: Normal rate.  Pulmonary:     Effort: Pulmonary effort is normal.  Abdominal:     General: Abdomen is flat.  Genitourinary:    Comments: Stomal marking site noted Musculoskeletal:        General: Normal range of motion.     Cervical back: Normal range of motion.  Skin:    General: Skin is warm.  Neurological:     General: No focal deficit present.     Mental Status: He is alert.  Psychiatric:        Mood and Affect: Mood normal.      Assessment/Plan  Proceed as planned with Rt neph-U, cystoprostaetctomy + node dissection + total urethrectomy + left cutanoues ureterostomy (v. Conduit pending intra-op anatomy) with curative intent. Risks, benefits, alternatives, expected peri-op course discussed previously and reiterated today.   Loletta Parish., MD 12/08/2023, 6:35 AM

## 2023-12-08 NOTE — Plan of Care (Signed)
  Problem: Education: Goal: Knowledge of General Education information will improve Description: Including pain rating scale, medication(s)/side effects and non-pharmacologic comfort measures Outcome: Progressing   Problem: Health Behavior/Discharge Planning: Goal: Ability to manage health-related needs will improve Outcome: Progressing   Problem: Clinical Measurements: Goal: Ability to maintain clinical measurements within normal limits will improve Outcome: Progressing Goal: Will remain free from infection Outcome: Progressing Goal: Diagnostic test results will improve Outcome: Progressing Goal: Respiratory complications will improve Outcome: Progressing Goal: Cardiovascular complication will be avoided Outcome: Progressing   Problem: Activity: Goal: Risk for activity intolerance will decrease Outcome: Progressing   Problem: Nutrition: Goal: Adequate nutrition will be maintained Outcome: Progressing   Problem: Coping: Goal: Level of anxiety will decrease Outcome: Progressing   Problem: Elimination: Goal: Will not experience complications related to bowel motility Outcome: Progressing Goal: Will not experience complications related to urinary retention Outcome: Progressing   Problem: Pain Managment: Goal: General experience of comfort will improve and/or be controlled Outcome: Progressing   Problem: Safety: Goal: Ability to remain free from injury will improve Outcome: Progressing   Problem: Skin Integrity: Goal: Risk for impaired skin integrity will decrease Outcome: Progressing   Problem: Education: Goal: Ability to describe self-care measures that may prevent or decrease complications (Diabetes Survival Skills Education) will improve Outcome: Progressing Goal: Individualized Educational Video(s) Outcome: Progressing   Problem: Coping: Goal: Ability to adjust to condition or change in health will improve Outcome: Progressing   Problem: Fluid  Volume: Goal: Ability to maintain a balanced intake and output will improve Outcome: Progressing   Problem: Health Behavior/Discharge Planning: Goal: Ability to identify and utilize available resources and services will improve Outcome: Progressing Goal: Ability to manage health-related needs will improve Outcome: Progressing   Problem: Metabolic: Goal: Ability to maintain appropriate glucose levels will improve Outcome: Progressing   Problem: Nutritional: Goal: Maintenance of adequate nutrition will improve Outcome: Progressing Goal: Progress toward achieving an optimal weight will improve Outcome: Progressing   Problem: Skin Integrity: Goal: Risk for impaired skin integrity will decrease Outcome: Progressing   Problem: Tissue Perfusion: Goal: Adequacy of tissue perfusion will improve Outcome: Progressing   Problem: Education: Goal: Knowledge of the procedure and recovery process will improve Outcome: Progressing   Problem: Bowel/Gastric: Goal: Gastrointestinal status for postoperative course will improve Outcome: Progressing   Problem: Pain Management: Goal: General experience of comfort will improve Outcome: Progressing   Problem: Skin Integrity: Goal: Demonstration of wound healing without infection will improve Outcome: Progressing   Problem: Urinary Elimination: Goal: Ability to avoid or minimize complications of infection will improve Outcome: Progressing Goal: Ability to achieve and maintain urine output will improve Outcome: Progressing Goal: Home care management will improve Outcome: Progressing

## 2023-12-08 NOTE — Brief Op Note (Signed)
12/08/2023  5:01 PM  PATIENT:  Robert [name]  73 y.o. male  PRE-OPERATIVE DIAGNOSIS:  RIGHT RENAL PELVIS AND BLADDER CANCER  POST-OPERATIVE DIAGNOSIS:  RIGHT RENAL PELVIS AND BLADDER CANCER  PROCEDURE:  Procedure(s) with comments: CYSTOSCOPY (N/A) - 390 MINUTES NEEDED FOR CASE RIGHT ROBOT ASSISTED Nephroureterectomy (Right) XI ROBOTIC ASSISTED PELVIC LYMPH NODE DISSECTION/CONDUIT DIVERSION (N/A) TOTAL URETHRECTOMY,RADICAL (N/A) ROBOT ASSISTED LAPAROSCOPIC RADICAL CYSTOPROSTATECTOMY  SURGEON:  Surgeons and Role:    * Manny, Delbert Phenix., MD - Primary  PHYSICIAN ASSISTANT:   ASSISTANTS: Flo Shanks PA   ANESTHESIA:   local and general  EBL:  400 mL   BLOOD ADMINISTERED:none  DRAINS:  left cutaneous ureterostomy to gravity. JP to bulb    LOCAL MEDICATIONS USED:  MARCAINE     SPECIMEN:  Source of Specimen:  1 - Pelvic lymph nodes, 2- ureteral margin, 3- Rt kidney + proximal ureter, 4 - bladder + prostate + urethra + Rt distal ureter  DISPOSITION OF SPECIMEN:  PATHOLOGY  COUNTS:  YES  TOURNIQUET:  * No tourniquets in log *  DICTATION: .Other Dictation: Dictation Number [id number]  PLAN OF CARE: Admit to inpatient   PATIENT DISPOSITION:  PACU - hemodynamically stable.   Delay start of Pharmacological VTE agent (>24hrs) due to surgical blood loss or risk of bleeding: yes

## 2023-12-08 NOTE — Transfer of Care (Signed)
Immediate Anesthesia Transfer of Care Note  Patient: Robert [name]  Procedure(s) Performed: CYSTOSCOPY (Bladder) RIGHT ROBOT ASSISTED Nephroureterectomy (Right: Abdomen) XI ROBOTIC ASSISTED PELVIC LYMPH NODE DISSECTION/CONDUIT DIVERSION (Abdomen) TOTAL URETHRECTOMY,RADICAL (Scrotum) ROBOT ASSISTED LAPAROSCOPIC RADICAL CYSTOPROSTATECTOMY (Abdomen)  Patient Location: PACU  Anesthesia Type:General  Level of Consciousness: awake, alert , oriented, and patient cooperative  Airway & Oxygen Therapy: Patient Spontanous Breathing and Patient connected to face mask oxygen  Post-op Assessment: Report given to RN and Post -op Vital signs reviewed and stable  Post vital signs: Reviewed and stable  Last Vitals:  Vitals Value Taken Time  BP 138/70 12/08/23 1445  Temp    Pulse 92 12/08/23 1447  Resp 12 12/08/23 1447  SpO2 95 % 12/08/23 1447  Vitals shown include unfiled device data.  Last Pain:  Vitals:   12/08/23 0636  TempSrc: Oral  PainSc:          Complications: No notable events documented.

## 2023-12-08 NOTE — Discharge Instructions (Signed)

## 2023-12-08 NOTE — Anesthesia Preprocedure Evaluation (Signed)
Anesthesia Evaluation  Patient identified by MRN, date of birth, ID band Patient awake    Reviewed: Allergy & Precautions, NPO status , Patient's Chart, lab work & pertinent test results  History of Anesthesia Complications Negative for: history of anesthetic complications  Airway Mallampati: III  TM Distance: >3 FB Neck ROM: Full    Dental  (+) Dental Advisory Given, Teeth Intact   Pulmonary neg shortness of breath, neg sleep apnea, neg COPD, neg recent URI, Current Smoker and Patient abstained from smoking.   breath sounds clear to auscultation       Cardiovascular hypertension, Pt. on medications and Pt. on home beta blockers (-) angina (-) Past MI + dysrhythmias Atrial Fibrillation + Valvular Problems/Murmurs AI  Rhythm:Regular  1. Moderate asymmetric hypertrophy of the apical segments and apex up to  1.4 cm. 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 No apical aneurysm seen on contrast imaging. Would  recommend cardiac MRI for better characterization.   Left ventricular ejection fraction, by estimation, is 65 to 70%. The left  ventricle has normal function. The left ventricle has no regional wall  motion abnormalities. There is moderate asymmetric left ventricular  hypertrophy of the apical segment. Left  ventricular diastolic parameters are consistent with Grade II diastolic  dysfunction (pseudonormalization).   2. Right ventricular systolic function is normal. The right ventricular  size is normal. Tricuspid regurgitation signal is inadequate for assessing  PA pressure.   3. Left atrial size was mild to moderately dilated.   4. The mitral valve is degenerative. Trivial mitral valve regurgitation.  No evidence of mitral stenosis. Moderate mitral annular calcification.   5. The aortic valve is tricuspid. There is mild calcification of the  aortic valve. There is mild thickening of the aortic  valve. Aortic valve  regurgitation is mild. Aortic valve sclerosis/calcification is present,  without any evidence of aortic  stenosis.   6. Aortic dilatation noted. There is mild dilatation of the aortic root,  measuring 42 mm. There is mild dilatation of the ascending aorta,  measuring 41 mm.   7. The inferior vena cava is normal in size with greater than 50%  respiratory variability, suggesting right atrial pressure of 3 mmHg.      Neuro/Psych negative neurological ROS  negative psych ROS   GI/Hepatic negative GI ROS, Neg liver ROS,,,  Endo/Other  diabetes, Type 2  Lab Results      Component                Value               Date                      HGBA1C                   5.9 (A)             10/05/2023             Renal/GU      Musculoskeletal  (+) Arthritis ,    Abdominal   Peds  Hematology  (+) Blood dyscrasia Lab Results      Component                Value               Date  WBC                      9.0                 12/07/2023                HGB                      14.1                12/07/2023                HCT                      41.6                12/07/2023                MCV                      90.2                12/07/2023                PLT                      154                 12/07/2023             Eliquis held   Anesthesia Other Findings   Reproductive/Obstetrics                              Anesthesia Physical Anesthesia Plan  ASA: 2  Anesthesia Plan: General   Post-op Pain Management: Ofirmev IV (intra-op)*   Induction: Intravenous  PONV Risk Score and Plan: 2 and Ondansetron and Dexamethasone  Airway Management Planned: Oral ETT  Additional Equipment: None  Intra-op Plan:   Post-operative Plan: Extubation in OR  Informed Consent: I have reviewed the patients History and Physical, chart, labs and discussed the procedure including the risks, benefits and alternatives  for the proposed anesthesia with the patient or authorized representative who has indicated his/her understanding and acceptance.     Dental advisory given  Plan Discussed with: CRNA  Anesthesia Plan Comments:          Anesthesia Quick Evaluation

## 2023-12-09 LAB — BASIC METABOLIC PANEL
Anion gap: 8 (ref 5–15)
BUN: 22 mg/dL (ref 8–23)
CO2: 22 mmol/L (ref 22–32)
Calcium: 8 mg/dL — ABNORMAL LOW (ref 8.9–10.3)
Chloride: 106 mmol/L (ref 98–111)
Creatinine, Ser: 1.44 mg/dL — ABNORMAL HIGH (ref 0.61–1.24)
GFR, Estimated: 51 mL/min — ABNORMAL LOW (ref >60)
Glucose, Bld: 182 mg/dL — ABNORMAL HIGH (ref 70–99)
Potassium: 4 mmol/L (ref 3.5–5.1)
Sodium: 136 mmol/L (ref 135–145)

## 2023-12-09 LAB — GLUCOSE, CAPILLARY
Glucose-Capillary: 181 mg/dL — ABNORMAL HIGH (ref 70–99)
Glucose-Capillary: 178 mg/dL — ABNORMAL HIGH (ref 70–99)
Glucose-Capillary: 116 mg/dL — ABNORMAL HIGH (ref 70–99)
Glucose-Capillary: 182 mg/dL — ABNORMAL HIGH (ref 70–99)

## 2023-12-09 LAB — HEMOGLOBIN AND HEMATOCRIT, BLOOD
HCT: 34.3 % — ABNORMAL LOW (ref 39.0–52.0)
Hemoglobin: 11.3 g/dL — ABNORMAL LOW (ref 13.0–17.0)

## 2023-12-09 NOTE — Op Note (Unsigned)
NAMERASHOD, Robert [name] MEDICAL RECORD NO: [id number] ACCOUNT NO: [id number] DATE OF BIRTH: 1951/09/04 FACILITY: [facility] LOCATION: [id number] PHYSICIAN: Sebastian Ache, MD  Operative Report   SURGEON:  Sebastian Ache, MD.  PREOPERATIVE DIAGNOSIS:  Multifocal high-grade urothelial carcinoma, right renal pelvis, ureter, bladder, and prostatic urethra.  POSTOPERATIVE DIAGNOSIS:  Multifocal high-grade urothelial carcinoma, right renal pelvis, ureter, bladder, and prostatic urethra.  PROCEDURE PERFORMED: 1.  Cystoscopy with injection of indocyanine green dye. 2.  Right robotic right nephroureterectomy. 3.  Robotic radical cystectomy with total urethrectomy, bilateral pelvic lymph node dissection, and left cutaneous ureterostomy.  ESTIMATED BLOOD LOSS:  150 mL.  COMPLICATIONS: None.  SPECIMENS: 1.  Right distal ureteral margin frozen section negative. 2.  Left distal ureteral margin. 3.  Right kidney plus ureter proximal en bloc. 4.  Right distal ureter plus bladder plus prostate plus total urethra. 5.  Right external lymph nodes. 6.  Right arterial lymph nodes. 7.  Right internal iliac lymph nodes. 8.  Right common iliac lymph nodes. 9.  Left common iliac lymph nodes. 10.  Left external iliac lymph nodes. 11.  Left internal iliac lymph nodes. 12.  Left obturator lymph nodes.  FINDINGS: 1.  Minimal volume intraluminal papillary tumor in the urinary bladder. 2.  Significant thickening, desmoplastic reaction, enlargement of the right kidney and ureter. 3.  Sentinel lymph nodes in the right hemipelvis noted on pathology requisition.  ASSISTANT:  Harrie Foreman, PA.  DRAINS: 1.  Jackson-Pratt drain to bulb suction. 2.  Left continuous urostomy with distal bander stent to gravity drainage.  INDICATIONS:  The patient is a 73 year old man with a longstanding history of multifocal urothelial carcinoma.  He has been on very heroic path with attempts at organ preservation as long as possible.   He is status post chemotherapy twice as well as  systemic immune therapy and numerous endoscopic procedures. Despite these maneuvers, he has continued multifocal recurrent high-grade urothelial carcinoma of the renal pelvis, right ureter, bladder, and prostatic urethra.  Staging imaging fortunately  remains unremarkable for metastatic disease. Options were discussed including curative and non-curative paths with major extirpated surgery being optimal for goal of cure.  He presents for this today with right nephroureterectomy, cystectomy, and total  urethrectomy and left cutaneous ureterostomy.  He was admitted yesterday for bowel prep.  Preop labs were all unremarkable. Informed consent was signed and placed in the record.  PROCEDURE IN DETAIL:  The patient being Robert [name] verified and the procedure being cysto with injection of indocyanine dye, right nephroureterectomy, and cystectomy with total urethrectomy was confirmed.  Procedure timeout was performed.   Intravenous antibiotics were administered.  General endotracheal anesthesia induced.  Foley catheter was placed per urethra to straight drain.  The patient was placed into a right side up full flank positioning, pulling 15 degrees of table flexion,  superior arm elevator, axillary roll, sequential compression devices, bottom leg bent, top leg straight.  He was further fastened to the operating table using 3-inch tape over foam padding across the supraxiphoid chest and his pelvis.  After appropriate  shaving the entire abdomen, the abdomen was prepped using chlorhexidine gluconate and high-flow low pressure pneumoperitoneum was obtained using Veress technique in the right lower quadrant having passed the aspiration and drop test.  An 8-mm right  camera port was placed into position approximately 1-1/2 handbreadth superior lateral to the umbilicus.  Laparoscopic examination of peritoneal cavity revealed no significant adhesions, no visceral  injury.  Additional ports were placed as follows:  Right  subcostal 8-mm robotic port, right far lateral 8-mm robotic port, proximally 4 fingerbreadths superomedial to the anterior superior iliac spine, right paramedian and inferior robotic port, and a 12-mm assistant port in the supraumbilical midline, 12-mm  AirSeal type, and another 5-mm midline port in the xiphoid location through which a self-locking grasper was used to elevate the inferior surface of the liver acting as liver retractor.  Robot was docked and passed the electronic checks.  Initial  attention was directed at the development of the space of the Retzius.  Incision was made lateral to the ascending colon near the cecum towards the area of the hepatic flexure.  The colon was carefully swept medially.  The anterior surface of Gerota's  fascia in this retroperitoneal plane was somewhat desmoplastic, keeping with his known multiple procedures and systemic therapy but was all quite safe.  Lower pole of the kidney was identified and placed in gentle lateral traction.  Dissection proceeded  medial to this.  The ureter and gonadal vessels were encountered.  The gonadal vessels were allowed to fall medially. The ureter was kept laterally.  Ureter was quite large caliber given some desmoplasia around this, but no obvious extraluminal disease.   Dissection proceeded distally past the area of the gonadal crossing to the area of the iliac vessels keeping a healthy fatty rim of tissue around the ureter for maximum oncologic benefit to just above the iliac vessels.  The ureter was  purposely transected using green load stapler to prevent any tumor spillage and the plane lateral to the inferior vena cava was developed medial to the kidney towards the area of the renal hilum.  Renal hilum was quite complex and consisted of 2 veins  and 3 arteries.  The artery was controlled using Hem-o-lok clip proximal, vascular stapler distal.  The next inferior artery  was similarly controlled using Hem-o-lok clip proximal, vascular stapler distal en bloc with a lower pole renal vein and superior  artery controlled using extra large Hem-o-lok clip proximal, vascular stapler distal and a separate vascular load for the upper renal vein.  This yielded excellent hemostatic control of the hilum.  The medial plane was further developed just lateral to  the inferior vena cava.  There was no significant obvious adenopathy.  The adrenal was separated in a partial adrenal fashion using vascular stapler.  Superior and lateral attachments were then taken down.  Once completely freed up, the right  nephroureterectomy specimen was placed in extra large endocatch bag for later retrieval.  Hemostasis was excellent.  Final counts were correct.  We achieved the goals of the first step of the procedure today.  Specimen bag string was purposely brought  through the 12 mm assistant port site in the midline and the superior robotic port site and 5-mm site were closed at the level of the skin using subcuticular Monocryl, Dermabond as was the inferior most robotic port site. This left the midline and 2  right middle paramedian sites open, which were covered with an [place] as would be re-used for the second staged procedure.  The patient was repositioned into a low lithotomy position after removing the beanbag off from underneath him carefully.  Foley  catheter was removed.  He was further fashioned to the operating table using 3-inch tape over foam padding across the supraxiphoid chest.  A test of steep Trendelenburg positioning was performed and found to be suitably positioned.  A new sterile field  was created, prepped and draped the patient's  entire infraumbilical abdomen utilizing chlorhexidine gluconate after removing the [place], which had been covering the 3 small purposely open port sites of his penis, perineum, proximal thighs using iodine  after clipper shaving his perineum.   Pneumoperitoneum was reestablished by placing an 8-mm robotic port into the previous superior midline port site bluntly which reestablished pneumoperitoneum.  The right lateral-most site was developed to accommodate  12-mm AirSeal port.  A new right paramedian 8-mm robotic port was placed as was a paramedian 12-mm assistant port site and a left paramedian 8 mm port and a left far lateral 8 mm robotic port.  Robot was docked and passed the electronic checks  simultaneously. Cystourethroscopy performed using 21-French rigid cystoscope with offset lens.  Inspection of anterior and posterior urethra was unremarkable and 5 mL of indocyanine green dye was injected across several submucosal blebs, concentrated to  the trigone where there was a scant papillary tumor noted for sentinel lymphangiography.  Using robotic approach from above, attention was at the left sided retroperitoneal dissection.  Incision made lateral to the descending colon from the area of the  iliac vessel superiorly for a distance approximately 16 cm and distally coursing lateral to the left medial umbilical ligament towards the area of the anterior abdominal wall.  This created a large retroperitoneal flap, which was retracted medially.   This allowed visualization of the ureter, which was marked with a vessel loop and dissected proximally to the area of the gonadal crossing distally to the ureterovesical junction, which was doubly clipped and ligated with proximal clip containing a dye  tag suture.  Frozen section negative for carcinoma.  Left ureter stuck out of true pelvis.   Ureteral dissection inherently exposed the area of the left pelvic lymph nodes, which was inspected under near-infrared fluorescence light.  There was good  uptake of the bladder, but no sentinel lymph nodes were seen on the left.  As such, standard template lymphadenectomy was performed, first left common iliac group, boundaries being left common iliac artery and vein,  aortic bifurcation, iliac bifurcation.   Lymphostasis achieved with cold clips set aside labeled as left common iliac nodes. Next, the left external iliac group was dissected free, boundaries being left external iliac vein, pelvic sidewall, external iliac artery.  Lymphostasis achieved with  cold clip set aside as left external lymph nodes.  Next, the left obturator group was dissected free with boundaries being left external iliac vein, pelvic sidewall, obturator nerve, set aside as left obturator lymph nodes. Obturator nerve was inspected  following these maneuvers and found to be uninjured.  Finally, additional fibrotic tissue overlying the internal iliac artery from the area of the iliac bifurcation to the superior vesical artery was dissected free, set aside, labeled as left internal iliac  lymph nodes.  The endopelvic fascia was swept away from the lateral aspect of the prostate in base to apex orientation.  This completed the left side retroperitoneal dissection. Attention was then noted at the right side.  The previous right ureteral  stump was visualized easily approximately at the low iliac vessels.  A retroperitoneal flap was continued inferiorly crossing lateral to the right medial umbilical ligament.  This created a retroperitoneal flap which was retracted medially and the right  ureter was circumferentially mobilized to the area of the ureterovesical junction.  Again, there was significant desmoplasia around this plane consistent with a known tumor and purposely very wide dissection was performed in this area.  The right bladder  was swept away  from the pelvic sidewall towards the endopelvic fascia, which flipped away from the lateral side of the prostate.  This exposed the right-sided pelvic lymph node fields quite well.  A mirror-image lymphadenectomy was performed on the  right side as per the left of the right common iliac, external iliac, internal iliac, obturator groups respectively.   There were several sentinel lymph nodes noted and these were noted on pathology requisition and the right obturator nerve was inspected  following these maneuvers and found to be uninjured.  Attention was directed to the posterior dissection.  An inverted U incision was made at several centimeters superior to the area of the seminal vesicles creating a posterior peritoneal flap.  This  plane was developed in the midline towards the area of the apex of the prostate, which was noted by anterior curvature and this plane was then swept laterally.  This exposed the vascular pedicles of the bladder and prostate which were controlled using  white load stapler x2 on each side, which showed excellent hemostatic control of the pedicles in a very safe fashion.  This completed the posterior dissection. Next, space of Retzius was developed between the medial umbilical ligaments towards the area  of dorsal venous complex was controlled using renal stapler.  Circumferential mobilization was performed down to the area of the membranous urethra quite aggressively through the superior aspect of the Genitourinary diaphragm taking exquisite care to avoid any  rectal injuries did not occur.  Having freed up the bladder and prostate en-bloc specimen en bloc from above, attention was directed at that time to total urethrectomy.  Incision was made along the median raphe and the perineum from the scrotum to an area approximately 2 cm anterior to the anal verge.  A wound protector type retractor was employed.  Dissection was carried down through the skin and subcutaneous tissue to  the area of the dartos musculature, which was incised in the midline exposing the area of the bulbar urethra.  Urethra was easily noted.  It was quite mobile and nonfixed.  This was mobilized and marked with a quarter-inch Penrose drain at the level of  the bulbar urethra and then dissected distally as much as possible via the perineal incision by  inverting the penis.  This was performed all the way down to the area of the glans penis as distally as possible. Penis was then reverted and a  circumferential incision was made at the level of the glans connecting the previous perineal approach incision and delivering the urethra through the perineum. The glans penis was reapproximated using horizontal mattress x3 and resulted in excellent  hemostasis. The proximal urethra was then carefully dissected in an anterior plane palpated behind the pubic symphysis.  Continuity was established to the intra-abdominal portion in the previous dissection, then circumferentially mobilized.  This  completely freed up the urethral aspect of the en bloc specimen, which was then delivered into the abdomen and placed into an extra large EndoCatch bag.  The perineal incision was very carefully closed in 4 separate deep tissue layers and then followed  by reapproximating the skin using horizontal mattress of Monocryl.  Sponge and needle counts were correct. Hemostasis was excellent. We completed the extirpated portions of the procedure today. The previous right two 12-mm assistant port sites was closed  over the fascia using Carter-Thomason suture passer.  The left ureter tag suture was placed into a self-locking grasper, laparoscopic type via the left paramedian robotic port site.  Robot was then  undocked.  The specimen was retrieved by extending the  previous camera port site superiorly for a distance of approximately 5 cm removing the right kidney and ureteral specimen and setting aside for pathology as well as the bladder and en bloc urethra specimen, also setting aside for permanent pathology.   The ureter was very carefully grasped on its tag suture view of the site as well and a Y-shaped incision was made at the previously marked stomal site in the left superior paramedian location.  This was dilated to accommodate the surgeon's thumb and the  ureter was carefully  passed through this. The ureter was spatulated for a distance of approximately 1.5 cm and using the apex of the Y-shaped skin incision, this was delivered into the apex of the spatulated ureter and ureteral to skin anastomosis was  very carefully performed circumferentially after a blue-colored bander stent was placed 26 cm to the ureter to skin anastomosis.  A stitch of 3-0 nylon was then applied through and through in a purposeful air knot fashion to prevent inadvertent early  displacement of this.  I was quite happy with the geometry and vascularity of the cutaneous ureterostomy.  The extraction site was closed at the level of the fascia using a figure-of-eight PDS #6 followed by reapproximation of Scarpa's running Vicryl.   All incision sites were infiltrated with dilute lipolyzed Marcaine and closed at the level of skin using subcuticular Monocryl followed by Dermabond.  Procedure was then terminated.  The patient tolerated the procedure well with no immediate  perioperative complications.  The patient taken to postanesthesia care unit in stable condition.  Plan for the patient admission.  Please note, first assistant, Harrie Foreman was crucial for all portions of the surgery today.  She provided invaluable retraction, suctioning, vascular clipping, vascular stapling, robotic instrument exchange, general first assistance.   NIK D: 12/08/2023 5:22:31 pm T: 12/09/2023 1:24:00 am  JOB: [id number]/ [id number]

## 2023-12-09 NOTE — Progress Notes (Signed)
Urology Inpatient Progress Note  Subjective: Patient up in chair.  Has ambulated this am.  Starting clears.   Afebrile Urine clear  Anti-infectives: Anti-infectives (From admission, onward)    Start     Dose/Rate Route Frequency Ordered Stop   12/08/23 1715  piperacillin-tazobactam (ZOSYN) IVPB 3.375 g  Status:  Discontinued        3.375 g 100 mL/hr over 30 Minutes Intravenous Every 8 hours 12/08/23 1621 12/08/23 1627   12/08/23 1630  piperacillin-tazobactam (ZOSYN) IVPB 3.375 g        3.375 g 12.5 mL/hr over 240 Minutes Intravenous Every 8 hours 12/08/23 1627 12/09/23 1629   12/08/23 0645  piperacillin-tazobactam (ZOSYN) IVPB 3.375 g        3.375 g 100 mL/hr over 30 Minutes Intravenous  Once 12/07/23 1507 12/08/23 0737   12/07/23 2030  neomycin (MYCIFRADIN) tablet 500 mg        500 mg Oral Every 4 hours 12/07/23 1932 12/08/23 0026   12/07/23 2030  metroNIDAZOLE (FLAGYL) tablet 500 mg        500 mg Oral Every 4 hours 12/07/23 1932 12/08/23 0026       Current Facility-Administered Medications  Medication Dose Route Frequency Provider Last Rate Last Admin   0.9 %  sodium chloride infusion   Intravenous Continuous Harrie Foreman, PA-C 75 mL/hr at 12/08/23 1640 New Bag at 12/08/23 1640   acetaminophen (OFIRMEV) IV 1,000 mg  1,000 mg Intravenous Q6H Dancy, Marchelle Folks, PA-C 400 mL/hr at 12/09/23 0549 1,000 mg at 12/09/23 0549   amLODipine (NORVASC) tablet 5 mg  5 mg Oral Daily Loletta Parish., MD       atorvastatin (LIPITOR) tablet 40 mg  40 mg Oral QHS Loletta Parish., MD   40 mg at 12/08/23 2049   Chlorhexidine Gluconate Cloth 2 % PADS 6 each  6 each Topical Daily Loletta Parish., MD   6 each at 12/07/23 2230   diphenhydrAMINE (BENADRYL) injection 12.5 mg  12.5 mg Intravenous Q6H PRN Harrie Foreman, PA-C       Or   diphenhydrAMINE (BENADRYL) 12.5 MG/5ML elixir 12.5 mg  12.5 mg Oral Q6H PRN Dancy, Amanda, PA-C       docusate sodium (COLACE) capsule 100 mg  100 mg Oral BID  Harrie Foreman, PA-C   100 mg at 12/08/23 2049   HYDROmorphone (DILAUDID) injection 0.5-1 mg  0.5-1 mg Intravenous Q2H PRN Harrie Foreman, PA-C   1 mg at 12/08/23 1642   insulin aspart (novoLOG) injection 0-15 Units  0-15 Units Subcutaneous TID WC Loletta Parish., MD   8 Units at 12/08/23 1655   mupirocin ointment (BACTROBAN) 2 % 1 Application  1 Application Nasal BID Loletta Parish., MD   1 Application at 12/08/23 2146   ondansetron (ZOFRAN) injection 4 mg  4 mg Intravenous Q4H PRN Harrie Foreman, PA-C       oxyCODONE (Oxy IR/ROXICODONE) immediate release tablet 5 mg  5 mg Oral Q4H PRN Dancy, Amanda, PA-C       piperacillin-tazobactam (ZOSYN) IVPB 3.375 g  3.375 g Intravenous Q8H Dancy, Amanda, PA-C 12.5 mL/hr at 12/09/23 0111 3.375 g at 12/09/23 0111   sodium chloride flush (NS) 0.9 % injection 10-40 mL  10-40 mL Intracatheter PRN Loletta Parish., MD         Objective: Vital signs in last 24 hours: Temp:  [96.9 F (36.1 C)-98.7 F (37.1 C)] 98.6 F (37 C) (02/15 0735) Pulse  Rate:  [74-94] 79 (02/15 0735) Resp:  [12-22] 18 (02/15 0735) BP: (122-152)/(63-81) 126/67 (02/15 0735) SpO2:  [95 %-98 %] 97 % (02/15 0735)  Intake/Output from previous day: 02/14 0701 - 02/15 0700 In: 3940.5 [P.O.:360; I.V.:3160.3; IV Piggyback:420.3] Out: 850 [Urine:350; Drains:100; Blood:400] Intake/Output this shift: No intake/output data recorded.  GENERAL APPEARANCE:  Well appearing, well developed, well nourished, NAD HEENT:  Atraumatic, normocephalic NECK:  Supple without lymphadenopathy  ABDOMEN:  Soft, + BS.  Port sites and cutaneous ureterostomy look good. EXTREMITIES:  Moves all extremities well, without clubbing, cyanosis, or edema NEUROLOGIC:  Alert and oriented x 3, normal gait, CN II-XII grossly intact MENTAL STATUS:  appropriate SKIN:  Warm, dry, and intact   Lab Results:  Recent Labs    12/07/23 1616 12/08/23 1512 12/09/23 0130  WBC 9.0  --   --   HGB 14.1 12.9*  11.3*  HCT 41.6 39.5 34.3*  PLT 154  --   --    BMET Recent Labs    12/07/23 1616 12/09/23 0130  NA 136 136  K 3.8 4.0  CL 106 106  CO2 22 22  GLUCOSE 110* 182*  BUN 26* 22  CREATININE 0.91 1.44*  CALCIUM 9.0 8.0*   PT/INR No results for input(s): "LABPROT", "INR" in the last 72 hours. ABG No results for input(s): "PHART", "HCO3" in the last 72 hours.  Invalid input(s): "PCO2", "PO2"  Studies/Results: No results found.   Assessment & Plan: POD #1 s/p---- 1.  Cystoscopy with injection of indocyanine green dye. 2.  Right robotic nephroureterectomy. 3.  Robotic radical cystectomy with total urethrectomy, pelvic lymph node dissection, and left cutaneous ureterostomy.  Patient looks good.  Clears today.   OOB/ambulate Recheck labs in am.   Joline Maxcy, MD 12/09/2023

## 2023-12-10 LAB — BASIC METABOLIC PANEL
Anion gap: 8 (ref 5–15)
BUN: 14 mg/dL (ref 8–23)
CO2: 21 mmol/L — ABNORMAL LOW (ref 22–32)
Calcium: 8.2 mg/dL — ABNORMAL LOW (ref 8.9–10.3)
Chloride: 107 mmol/L (ref 98–111)
Creatinine, Ser: 1.35 mg/dL — ABNORMAL HIGH (ref 0.61–1.24)
GFR, Estimated: 55 mL/min — ABNORMAL LOW (ref >60)
Glucose, Bld: 165 mg/dL — ABNORMAL HIGH (ref 70–99)
Potassium: 3.8 mmol/L (ref 3.5–5.1)
Sodium: 136 mmol/L (ref 135–145)

## 2023-12-10 LAB — CBC
HCT: 32.8 % — ABNORMAL LOW (ref 39.0–52.0)
Hemoglobin: 11.1 g/dL — ABNORMAL LOW (ref 13.0–17.0)
MCH: 30.9 pg (ref 26.0–34.0)
MCHC: 33.8 g/dL (ref 30.0–36.0)
MCV: 91.4 fL (ref 80.0–100.0)
Platelets: 118 10*3/uL — ABNORMAL LOW (ref 150–400)
RBC: 3.59 MIL/uL — ABNORMAL LOW (ref 4.22–5.81)
RDW: 13.3 % (ref 11.5–15.5)
WBC: 11.1 10*3/uL — ABNORMAL HIGH (ref 4.0–10.5)
nRBC: 0 % (ref 0.0–0.2)

## 2023-12-10 LAB — GLUCOSE, CAPILLARY
Glucose-Capillary: 163 mg/dL — ABNORMAL HIGH (ref 70–99)
Glucose-Capillary: 254 mg/dL — ABNORMAL HIGH (ref 70–99)
Glucose-Capillary: 89 mg/dL (ref 70–99)
Glucose-Capillary: 244 mg/dL — ABNORMAL HIGH (ref 70–99)

## 2023-12-10 NOTE — Anesthesia Postprocedure Evaluation (Signed)
Anesthesia Post Note  Patient: Robert [name]  Procedure(s) Performed: CYSTOSCOPY (Bladder) RIGHT ROBOT ASSISTED Nephroureterectomy (Right: Abdomen) XI ROBOTIC ASSISTED PELVIC LYMPH NODE DISSECTION/CONDUIT DIVERSION (Abdomen) TOTAL URETHRECTOMY,RADICAL (Scrotum) ROBOT ASSISTED LAPAROSCOPIC RADICAL CYSTOPROSTATECTOMY (Abdomen)     Patient location during evaluation: PACU Anesthesia Type: General Level of consciousness: awake and alert Pain management: pain level controlled Vital Signs Assessment: post-procedure vital signs reviewed and stable Respiratory status: spontaneous breathing, nonlabored ventilation and respiratory function stable Cardiovascular status: blood pressure returned to baseline and stable Postop Assessment: no apparent nausea or vomiting Anesthetic complications: no   No notable events documented.                Indica Marcott

## 2023-12-10 NOTE — Progress Notes (Signed)
Urology Inpatient Progress Note  Subjective: Patient feels well.  Tolerating clears well.  + flatus/no bm Has been oob and ambulating. JP 160 p 24.  Good UOP-clear    Anti-infectives: Anti-infectives (From admission, onward)    Start     Dose/Rate Route Frequency Ordered Stop   12/08/23 1715  piperacillin-tazobactam (ZOSYN) IVPB 3.375 g  Status:  Discontinued        3.375 g 100 mL/hr over 30 Minutes Intravenous Every 8 hours 12/08/23 1621 12/08/23 1627   12/08/23 1630  piperacillin-tazobactam (ZOSYN) IVPB 3.375 g        3.375 g 12.5 mL/hr over 240 Minutes Intravenous Every 8 hours 12/08/23 1627 12/09/23 1332   12/08/23 0645  piperacillin-tazobactam (ZOSYN) IVPB 3.375 g        3.375 g 100 mL/hr over 30 Minutes Intravenous  Once 12/07/23 1507 12/08/23 0737   12/07/23 2030  neomycin (MYCIFRADIN) tablet 500 mg        500 mg Oral Every 4 hours 12/07/23 1932 12/08/23 0026   12/07/23 2030  metroNIDAZOLE (FLAGYL) tablet 500 mg        500 mg Oral Every 4 hours 12/07/23 1932 12/08/23 0026       Current Facility-Administered Medications  Medication Dose Route Frequency Provider Last Rate Last Admin   amLODipine (NORVASC) tablet 5 mg  5 mg Oral Daily Loletta Parish., MD   5 mg at 12/09/23 0920   atorvastatin (LIPITOR) tablet 40 mg  40 mg Oral QHS Loletta Parish., MD   40 mg at 12/09/23 2112   Chlorhexidine Gluconate Cloth 2 % PADS 6 each  6 each Topical Daily Loletta Parish., MD   6 each at 12/09/23 1600   diphenhydrAMINE (BENADRYL) injection 12.5 mg  12.5 mg Intravenous Q6H PRN Harrie Foreman, PA-C       Or   diphenhydrAMINE (BENADRYL) 12.5 MG/5ML elixir 12.5 mg  12.5 mg Oral Q6H PRN Harrie Foreman, PA-C       docusate sodium (COLACE) capsule 100 mg  100 mg Oral BID Dancy, Amanda, PA-C   100 mg at 12/09/23 2112   HYDROmorphone (DILAUDID) injection 0.5-1 mg  0.5-1 mg Intravenous Q2H PRN Harrie Foreman, PA-C   1 mg at 12/08/23 1642   insulin aspart (novoLOG) injection 0-15  Units  0-15 Units Subcutaneous TID WC Loletta Parish., MD   3 Units at 12/09/23 1241   mupirocin ointment (BACTROBAN) 2 % 1 Application  1 Application Nasal BID Loletta Parish., MD   1 Application at 12/09/23 2112   ondansetron (ZOFRAN) injection 4 mg  4 mg Intravenous Q4H PRN Harrie Foreman, PA-C       oxyCODONE (Oxy IR/ROXICODONE) immediate release tablet 5 mg  5 mg Oral Q4H PRN Harrie Foreman, PA-C   5 mg at 12/10/23 0324   sodium chloride flush (NS) 0.9 % injection 10-40 mL  10-40 mL Intracatheter PRN Loletta Parish., MD         Objective: Vital signs in last 24 hours: Temp:  [98 F (36.7 C)-99.5 F (37.5 C)] 99 F (37.2 C) (02/16 0431) Pulse Rate:  [79-90] 90 (02/16 0431) Resp:  [20] 20 (02/16 0431) BP: (122-142)/(62-80) 142/72 (02/16 0431) SpO2:  [97 %-100 %] 97 % (02/16 0431)  Intake/Output from previous day: 02/15 0701 - 02/16 0700 In: 974.9 [I.V.:874.9; IV Piggyback:100] Out: 1760 [Urine:1600; Drains:160] Intake/Output this shift: Total I/O In: -  Out: 30 [Drains:30]  GENERAL APPEARANCE:  Well  appearing, well developed, well nourished, NAD HEENT:  Atraumatic, normocephalic ABDOMEN:  Soft + BS, surgical sites look good EXTREMITIES:  Moves all extremities well, without clubbing, cyanosis, or edema NEUROLOGIC:  Alert and oriented x 3, normal gait, CN II-XII grossly intact MENTAL STATUS:  appropriate    Lab Results:  Recent Labs    12/07/23 1616 12/08/23 1512 12/09/23 0130 12/10/23 0313  WBC 9.0  --   --  11.1*  HGB 14.1   < > 11.3* 11.1*  HCT 41.6   < > 34.3* 32.8*  PLT 154  --   --  118*   < > = values in this interval not displayed.   BMET Recent Labs    12/09/23 0130 12/10/23 0313  NA 136 136  K 4.0 3.8  CL 106 107  CO2 22 21*  GLUCOSE 182* 165*  BUN 22 14  CREATININE 1.44* 1.35*  CALCIUM 8.0* 8.2*   PT/INR No results for input(s): "LABPROT", "INR" in the last 72 hours. ABG No results for input(s): "PHART", "HCO3" in the last  72 hours.  Invalid input(s): "PCO2", "PO2"  Studies/Results: No results found.   Assessment & Plan: POD #2- s/p RAL NxU, urethrectomy, and cutaneous ureterostomy Doing very well Will advance diet today as tolerated Encourage OOB/ambulation   Joline Maxcy, MD 12/10/2023

## 2023-12-10 NOTE — Plan of Care (Signed)
  Problem: Education: Goal: Knowledge of General Education information will improve Description: Including pain rating scale, medication(s)/side effects and non-pharmacologic comfort measures Outcome: Progressing   Problem: Health Behavior/Discharge Planning: Goal: Ability to manage health-related needs will improve Outcome: Progressing   Problem: Clinical Measurements: Goal: Ability to maintain clinical measurements within normal limits will improve Outcome: Progressing Goal: Will remain free from infection Outcome: Progressing Goal: Diagnostic test results will improve Outcome: Progressing Goal: Respiratory complications will improve Outcome: Progressing Goal: Cardiovascular complication will be avoided Outcome: Progressing   Problem: Activity: Goal: Risk for activity intolerance will decrease Outcome: Progressing   Problem: Nutrition: Goal: Adequate nutrition will be maintained Outcome: Progressing   Problem: Coping: Goal: Level of anxiety will decrease Outcome: Progressing   Problem: Elimination: Goal: Will not experience complications related to bowel motility Outcome: Progressing Goal: Will not experience complications related to urinary retention Outcome: Progressing   Problem: Pain Managment: Goal: General experience of comfort will improve and/or be controlled Outcome: Progressing   Problem: Safety: Goal: Ability to remain free from injury will improve Outcome: Progressing   Problem: Skin Integrity: Goal: Risk for impaired skin integrity will decrease Outcome: Progressing   Problem: Education: Goal: Ability to describe self-care measures that may prevent or decrease complications (Diabetes Survival Skills Education) will improve Outcome: Progressing Goal: Individualized Educational Video(s) Outcome: Progressing   Problem: Coping: Goal: Ability to adjust to condition or change in health will improve Outcome: Progressing   Problem: Fluid  Volume: Goal: Ability to maintain a balanced intake and output will improve Outcome: Progressing   Problem: Health Behavior/Discharge Planning: Goal: Ability to identify and utilize available resources and services will improve Outcome: Progressing Goal: Ability to manage health-related needs will improve Outcome: Progressing   Problem: Metabolic: Goal: Ability to maintain appropriate glucose levels will improve Outcome: Progressing   Problem: Nutritional: Goal: Maintenance of adequate nutrition will improve Outcome: Progressing Goal: Progress toward achieving an optimal weight will improve Outcome: Progressing   Problem: Skin Integrity: Goal: Risk for impaired skin integrity will decrease Outcome: Progressing   Problem: Tissue Perfusion: Goal: Adequacy of tissue perfusion will improve Outcome: Progressing   Problem: Education: Goal: Knowledge of the procedure and recovery process will improve Outcome: Progressing   Problem: Bowel/Gastric: Goal: Gastrointestinal status for postoperative course will improve Outcome: Progressing   Problem: Pain Management: Goal: General experience of comfort will improve Outcome: Progressing   Problem: Skin Integrity: Goal: Demonstration of wound healing without infection will improve Outcome: Progressing   Problem: Urinary Elimination: Goal: Ability to avoid or minimize complications of infection will improve Outcome: Progressing Goal: Ability to achieve and maintain urine output will improve Outcome: Progressing Goal: Home care management will improve Outcome: Progressing

## 2023-12-10 NOTE — TOC Initial Note (Signed)
Transition of Care [facility]) - Initial/Assessment Note    Patient Details  Name: Robert [name] MRN: [id number] Date of Birth: December 10, 1950  Transition of Care [facility]) CM/SW Contact:    Adrian Prows, RN Phone Number: 12/10/2023, 4:52 PM  Clinical Narrative:                 TOC for d/c planning; spoke w/ pt in room; pt says he lives at home w/ his wife; he plans to return at d/c; pt identified POC son Robert [name] [phone]); family will provide transportation; he denies SDOH risks; pt verified insurance/PCP; pt says he has a cane; pt says he does not have HH services or home oxygen; no TOC needs; TOC is signing off; please place consult if needed.  Expected Discharge Plan: Home/Self Care Barriers to Discharge: Continued Medical Work up   Patient Goals and CMS Choice Patient states their goals for this hospitalization and ongoing recovery are:: home CMS Medicare.gov Compare Post Acute Care list provided to:: Patient        Expected Discharge Plan and Services   Discharge Planning Services: CM Consult   Living arrangements for the past 2 months: Single Family Home                                      Prior Living Arrangements/Services Living arrangements for the past 2 months: Single Family Home Lives with:: Spouse Patient language and need for interpreter reviewed:: Yes Do you feel safe going back to the place where you live?: Yes      Need for Family Participation in Patient Care: Yes (Comment) Care giver support system in place?: Yes (comment) Current home services: DME (cane) Criminal Activity/Legal Involvement Pertinent to Current Situation/Hospitalization: No - Comment as needed  Activities of Daily Living   ADL Screening (condition at time of admission) Independently performs ADLs?: No Does the patient have a NEW difficulty with bathing/dressing/toileting/self-feeding that is expected to last >3 days?: No Does the patient have a NEW  difficulty with getting in/out of bed, walking, or climbing stairs that is expected to last >3 days?: No Does the patient have a NEW difficulty with communication that is expected to last >3 days?: No Is the patient deaf or have difficulty hearing?: No Does the patient have difficulty seeing, even when wearing glasses/contacts?: No Does the patient have difficulty concentrating, remembering, or making decisions?: No  Permission Sought/Granted Permission sought to share information with : Case Manager Permission granted to share information with : Yes, Verbal Permission Granted  Share Information with NAME: Case Manager     Permission granted to share info w Relationship: Aedon Deason (son) [phone]     Emotional Assessment Appearance:: Appears stated age Attitude/Demeanor/Rapport: Gracious Affect (typically observed): Accepting Orientation: : Oriented to Self, Oriented to Place, Oriented to  Time, Oriented to Situation Alcohol / Substance Use: Not Applicable Psych Involvement: No (comment)  Admission diagnosis:  Bladder cancer [facility]) [C67.9] Patient Active Problem List   Diagnosis Date Noted   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 10/09/2023   Hypomagnesemia 07/03/2023   Port-A-Cath in place 06/09/2023   Elevated PSA measurement 02/28/2023   Preop examination 02/27/2023   Osteoarthritis of right hip 06/29/2022   Bladder cancer (HCC) 01/31/2022   Primary osteoarthritis of right hip 10/19/2021   Long term current use of anticoagulant -Eliquis 03/12/2019   Paroxysmal atrial fibrillation (HCC) 03/12/2019   Overweight (BMI 25.0-29.9)  01/03/2019   Type 2 diabetes mellitus without complication, without long-term current use of insulin (HCC) 07/23/2018   BPH associated with nocturia 03/01/2017   Irregular heart rate 06/23/2011   Hx of colonic polyps    ERECTILE DYSFUNCTION 12/19/2008   SKIN CANCER, HX OF 12/19/2008   Hyperlipidemia 08/21/2007   Essential hypertension 08/21/2007   PCP:   Tresa Garter, MD Pharmacy:   [facility] [phone] - [place], [place] - [address] [place] [address] Phone: [phone] Fax: [phone]     Social Drivers of Health (SDOH) Social History: SDOH Screenings   Food Insecurity: No Food Insecurity (12/10/2023)  Housing: Low Risk  (12/10/2023)  Recent Concern: Housing - High Risk (12/07/2023)  Transportation Needs: No Transportation Needs (12/10/2023)  Utilities: Not At Risk (12/10/2023)  Alcohol Screen: Low Risk  (10/08/2023)  Depression (PHQ2-9): Low Risk  (10/09/2023)  Financial Resource Strain: Low Risk  (10/08/2023)  Physical Activity: Insufficiently Active (10/08/2023)  Social Connections: Unknown (12/07/2023)  Stress: No Stress Concern Present (10/08/2023)  Tobacco Use: High Risk (12/08/2023)   SDOH Interventions: Food Insecurity Interventions: Intervention Not Indicated, Inpatient TOC Housing Interventions: Intervention Not Indicated, Inpatient TOC Transportation Interventions: Intervention Not Indicated, Inpatient TOC Utilities Interventions: Intervention Not Indicated, Inpatient TOC   Readmission Risk Interventions    12/10/2023    4:50 PM  Readmission Risk Prevention Plan  Transportation Screening Complete  PCP or Specialist Appt within 5-7 Days Complete  Home Care Screening Complete  Medication Review (RN CM) Complete

## 2023-12-11 ENCOUNTER — Encounter (HOSPITAL_COMMUNITY): Payer: Self-pay | Admitting: Urology

## 2023-12-11 LAB — BASIC METABOLIC PANEL
Anion gap: 11 (ref 5–15)
BUN: 14 mg/dL (ref 8–23)
CO2: 19 mmol/L — ABNORMAL LOW (ref 22–32)
Calcium: 8.4 mg/dL — ABNORMAL LOW (ref 8.9–10.3)
Chloride: 106 mmol/L (ref 98–111)
Creatinine, Ser: 1.25 mg/dL — ABNORMAL HIGH (ref 0.61–1.24)
GFR, Estimated: >60 mL/min (ref >60)
Glucose, Bld: 154 mg/dL — ABNORMAL HIGH (ref 70–99)
Potassium: 3.5 mmol/L (ref 3.5–5.1)
Sodium: 136 mmol/L (ref 135–145)

## 2023-12-11 LAB — GLUCOSE, CAPILLARY
Glucose-Capillary: 154 mg/dL — ABNORMAL HIGH (ref 70–99)
Glucose-Capillary: 285 mg/dL — ABNORMAL HIGH (ref 70–99)
Glucose-Capillary: 193 mg/dL — ABNORMAL HIGH (ref 70–99)
Glucose-Capillary: 218 mg/dL — ABNORMAL HIGH (ref 70–99)

## 2023-12-11 LAB — HEMOGLOBIN AND HEMATOCRIT, BLOOD
HCT: 33.1 % — ABNORMAL LOW (ref 39.0–52.0)
Hemoglobin: 11.1 g/dL — ABNORMAL LOW (ref 13.0–17.0)

## 2023-12-11 LAB — CREATININE, FLUID (PLEURAL, PERITONEAL, JP DRAINAGE): Creat, Fluid: 1.2 mg/dL

## 2023-12-11 MED ORDER — ENOXAPARIN SODIUM 40 MG/0.4ML IJ SOSY
40.0000 mg | PREFILLED_SYRINGE | INTRAMUSCULAR | Status: DC
  Administered 2023-12-11: 40 mg via SUBCUTANEOUS
  Filled 2023-12-11 (×2): qty 0.4

## 2023-12-11 MED ORDER — POLYVINYL ALCOHOL 1.4 % OP SOLN
1.0000 [drp] | OPHTHALMIC | Status: DC | PRN
  Administered 2023-12-11: 1 [drp] via OPHTHALMIC
  Filled 2023-12-11: qty 15

## 2023-12-11 NOTE — Progress Notes (Signed)
Port-a-cath dressing loose. Biopatch in place. IV team consult placed for dressing change.

## 2023-12-11 NOTE — Progress Notes (Signed)
Urology Inpatient Progress Note  Subjective: Pt doing remarkably well postoperatively.  Pain controlled.  Tolerating soft diet w/o NV. 2xBM yesterda Adeqaute UOP from ureterostomy Creatinine downtrending Hemoglobin stable    Anti-infectives: Anti-infectives (From admission, onward)    Start     Dose/Rate Route Frequency Ordered Stop   12/08/23 1715  piperacillin-tazobactam (ZOSYN) IVPB 3.375 g  Status:  Discontinued        3.375 g 100 mL/hr over 30 Minutes Intravenous Every 8 hours 12/08/23 1621 12/08/23 1627   12/08/23 1630  piperacillin-tazobactam (ZOSYN) IVPB 3.375 g        3.375 g 12.5 mL/hr over 240 Minutes Intravenous Every 8 hours 12/08/23 1627 12/09/23 1332   12/08/23 0645  piperacillin-tazobactam (ZOSYN) IVPB 3.375 g        3.375 g 100 mL/hr over 30 Minutes Intravenous  Once 12/07/23 1507 12/08/23 0737   12/07/23 2030  neomycin (MYCIFRADIN) tablet 500 mg        500 mg Oral Every 4 hours 12/07/23 1932 12/08/23 0026   12/07/23 2030  metroNIDAZOLE (FLAGYL) tablet 500 mg        500 mg Oral Every 4 hours 12/07/23 1932 12/08/23 0026       Current Facility-Administered Medications  Medication Dose Route Frequency Provider Last Rate Last Admin   amLODipine (NORVASC) tablet 5 mg  5 mg Oral Daily Loletta Parish., MD   5 mg at 12/10/23 1133   atorvastatin (LIPITOR) tablet 40 mg  40 mg Oral QHS Loletta Parish., MD   40 mg at 12/10/23 2215   Chlorhexidine Gluconate Cloth 2 % PADS 6 each  6 each Topical Daily Loletta Parish., MD   6 each at 12/10/23 1500   diphenhydrAMINE (BENADRYL) injection 12.5 mg  12.5 mg Intravenous Q6H PRN Harrie Foreman, PA-C       Or   diphenhydrAMINE (BENADRYL) 12.5 MG/5ML elixir 12.5 mg  12.5 mg Oral Q6H PRN Harrie Foreman, PA-C       docusate sodium (COLACE) capsule 100 mg  100 mg Oral BID Harrie Foreman, PA-C   100 mg at 12/10/23 2215   enoxaparin (LOVENOX) injection 40 mg  40 mg Subcutaneous Q24H Zettie Pho, MD       insulin  aspart (novoLOG) injection 0-15 Units  0-15 Units Subcutaneous TID WC Loletta Parish., MD   8 Units at 12/10/23 1314   mupirocin ointment (BACTROBAN) 2 % 1 Application  1 Application Nasal BID Loletta Parish., MD   1 Application at 12/10/23 2215   ondansetron [facility]) injection 4 mg  4 mg Intravenous Q4H PRN Harrie Foreman, PA-C       oxyCODONE (Oxy IR/ROXICODONE) immediate release tablet 5 mg  5 mg Oral Q4H PRN Harrie Foreman, PA-C   5 mg at 12/10/23 2349   sodium chloride flush (NS) 0.9 % injection 10-40 mL  10-40 mL Intracatheter PRN Loletta Parish., MD         Objective: Vital signs in last 24 hours: Temp:  [98 F (36.7 C)-98.7 F (37.1 C)] 98.2 F (36.8 C) (02/17 0425) Pulse Rate:  [85-102] 85 (02/17 0425) Resp:  [18-20] 18 (02/17 0425) BP: (113-122)/(66-80) 122/70 (02/17 0425) SpO2:  [97 %-99 %] 99 % (02/17 0425)  Intake/Output from previous day: 02/16 0701 - 02/17 0700 In: 240 [P.O.:240] Out: 345 [Urine:200; Drains:145] Intake/Output this shift: No intake/output data recorded.  GENERAL APPEARANCE:  Well appearing, well developed, well nourished, NAD HEENT:  Atraumatic,  normocephalic ABDOMEN:  Soft + BS, surgical sites look good EXTREMITIES:  Moves all extremities well, without clubbing, cyanosis, or edema NEUROLOGIC:  Alert and oriented x 3, normal gait, CN II-XII grossly intact MENTAL STATUS:  appropriate    Lab Results:  Recent Labs    12/10/23 0313 12/11/23 0318  WBC 11.1*  --   HGB 11.1* 11.1*  HCT 32.8* 33.1*  PLT 118*  --    BMET Recent Labs    12/10/23 0313 12/11/23 0318  NA 136 136  K 3.8 3.5  CL 107 106  CO2 21* 19*  GLUCOSE 165* 154*  BUN 14 14  CREATININE 1.35* 1.25*  CALCIUM 8.2* 8.4*   PT/INR No results for input(s): "LABPROT", "INR" in the last 72 hours. ABG No results for input(s): "PHART", "HCO3" in the last 72 hours.  Invalid input(s): "PCO2", "PO2"  Studies/Results: No results found.   Assessment &  Plan: POD #3- s/p RAL NxU, urethrectomy, and cutaneous ureterostomy ADT to regular diet WOCN formal assessment and teaching today JP creatinine with possible removal today Starting lovenox for VTE ppx Plan for dispo within the next 1-2 days   Zettie Pho, MD 12/11/2023

## 2023-12-11 NOTE — Plan of Care (Signed)
  Problem: Education: Goal: Knowledge of General Education information will improve Description: Including pain rating scale, medication(s)/side effects and non-pharmacologic comfort measures Outcome: Progressing   Problem: Health Behavior/Discharge Planning: Goal: Ability to manage health-related needs will improve Outcome: Progressing   Problem: Clinical Measurements: Goal: Ability to maintain clinical measurements within normal limits will improve Outcome: Progressing Goal: Will remain free from infection Outcome: Progressing Goal: Diagnostic test results will improve Outcome: Progressing Goal: Respiratory complications will improve Outcome: Progressing Goal: Cardiovascular complication will be avoided Outcome: Progressing   Problem: Activity: Goal: Risk for activity intolerance will decrease Outcome: Progressing   Problem: Nutrition: Goal: Adequate nutrition will be maintained Outcome: Progressing   Problem: Coping: Goal: Level of anxiety will decrease Outcome: Progressing   Problem: Elimination: Goal: Will not experience complications related to bowel motility Outcome: Progressing Goal: Will not experience complications related to urinary retention Outcome: Progressing   Problem: Pain Managment: Goal: General experience of comfort will improve and/or be controlled Outcome: Progressing   Problem: Safety: Goal: Ability to remain free from injury will improve Outcome: Progressing   Problem: Skin Integrity: Goal: Risk for impaired skin integrity will decrease Outcome: Progressing   Problem: Education: Goal: Ability to describe self-care measures that may prevent or decrease complications (Diabetes Survival Skills Education) will improve Outcome: Progressing Goal: Individualized Educational Video(s) Outcome: Progressing   Problem: Coping: Goal: Ability to adjust to condition or change in health will improve Outcome: Progressing   Problem: Fluid  Volume: Goal: Ability to maintain a balanced intake and output will improve Outcome: Progressing   Problem: Health Behavior/Discharge Planning: Goal: Ability to identify and utilize available resources and services will improve Outcome: Progressing Goal: Ability to manage health-related needs will improve Outcome: Progressing   Problem: Metabolic: Goal: Ability to maintain appropriate glucose levels will improve Outcome: Progressing   Problem: Nutritional: Goal: Maintenance of adequate nutrition will improve Outcome: Progressing Goal: Progress toward achieving an optimal weight will improve Outcome: Progressing   Problem: Skin Integrity: Goal: Risk for impaired skin integrity will decrease Outcome: Progressing   Problem: Tissue Perfusion: Goal: Adequacy of tissue perfusion will improve Outcome: Progressing   Problem: Education: Goal: Knowledge of the procedure and recovery process will improve Outcome: Progressing   Problem: Bowel/Gastric: Goal: Gastrointestinal status for postoperative course will improve Outcome: Progressing   Problem: Pain Management: Goal: General experience of comfort will improve Outcome: Progressing   Problem: Skin Integrity: Goal: Demonstration of wound healing without infection will improve Outcome: Progressing   Problem: Urinary Elimination: Goal: Ability to avoid or minimize complications of infection will improve Outcome: Progressing Goal: Ability to achieve and maintain urine output will improve Outcome: Progressing Goal: Home care management will improve Outcome: Progressing

## 2023-12-11 NOTE — Consult Note (Addendum)
WOC Nurse ostomy consult note  Pt had Ureterostomysurgery performed 2/14.  Stoma type/location: No visible stoma; slit in skin with 1 stint in place; opening is approx  3/4 inch.  Skin intact surrounding. Located in a slight fold.  Output: mod amt slightly cloudy urine.  Ostomy pouching: 1pc Education provided:  Pt watched the procedure using a hand held mirror.  He was able to stretch and apply the barrier ring to the back of the pouch and apply with assistance.  He is able to open and close to empty without assistance, as well as connect and disconnect the bedside drainage bag independently. Pt asked appropriate questions.  Reviewed pouching routines and ordering supplies.  5 sets of supplies left at the bedside for patient use; Use supplies: barrier ring [employer] [place] # [phone] and convex one piece urostomy pouches, [place] # [phone] Educational materials left at the bedside.  Enrolled patient in [employer] DC program: Yes, today . WOC team will continue to follow for further teaching sessions.  Thank-you,  Cammie Mcgee MSN, RN, CWOCN, [place], CNS [phone]

## 2023-12-11 NOTE — Progress Notes (Signed)
Mobility Specialist - Progress Note   12/11/23 1105  Mobility  Activity Ambulated with assistance in hallway  Level of Assistance Modified independent, requires aide device or extra time  Assistive Device Front wheel walker  Distance Ambulated (ft) 450 ft  Range of Motion/Exercises Active  Activity Response Tolerated well  Mobility Referral Yes  Mobility visit 1 Mobility  Mobility Specialist Start Time (ACUTE ONLY) 1055  Mobility Specialist Stop Time (ACUTE ONLY) 1105  Mobility Specialist Time Calculation (min) (ACUTE ONLY) 10 min   Pt was found in bed and agreeable to ambulate. No complaints with session. At EOS returned to bed with all needs met. Call bell in reach.  Billey Chang Mobility Specialist

## 2023-12-12 LAB — BASIC METABOLIC PANEL
Anion gap: 9 (ref 5–15)
BUN: 19 mg/dL (ref 8–23)
CO2: 22 mmol/L (ref 22–32)
Calcium: 8.1 mg/dL — ABNORMAL LOW (ref 8.9–10.3)
Chloride: 106 mmol/L (ref 98–111)
Creatinine, Ser: 1.42 mg/dL — ABNORMAL HIGH (ref 0.61–1.24)
GFR, Estimated: 52 mL/min — ABNORMAL LOW (ref >60)
Glucose, Bld: 163 mg/dL — ABNORMAL HIGH (ref 70–99)
Potassium: 3.6 mmol/L (ref 3.5–5.1)
Sodium: 137 mmol/L (ref 135–145)

## 2023-12-12 LAB — GLUCOSE, CAPILLARY
Glucose-Capillary: 166 mg/dL — ABNORMAL HIGH (ref 70–99)
Glucose-Capillary: 267 mg/dL — ABNORMAL HIGH (ref 70–99)

## 2023-12-12 LAB — HEMOGLOBIN AND HEMATOCRIT, BLOOD
HCT: 30.5 % — ABNORMAL LOW (ref 39.0–52.0)
Hemoglobin: 10.1 g/dL — ABNORMAL LOW (ref 13.0–17.0)

## 2023-12-12 LAB — SURGICAL PATHOLOGY

## 2023-12-12 MED ORDER — OXYCODONE HCL 5 MG PO TABS
5.0000 mg | ORAL_TABLET | ORAL | 0 refills | Status: DC | PRN
Start: 2023-12-12 — End: 2024-02-07

## 2023-12-12 MED ORDER — HEPARIN SOD (PORK) LOCK FLUSH 100 UNIT/ML IV SOLN
500.0000 [IU] | INTRAVENOUS | Status: AC | PRN
  Administered 2023-12-12: 500 [IU]

## 2023-12-12 NOTE — Care Management Important Message (Signed)
Important Message  Patient Details IM Letter given Name: Robert [name] MRN: [id number] Date of Birth: 1951/10/21   Important Message Given:  Yes - Medicare IM     Caren Macadam 12/12/2023, 8:04 AM

## 2023-12-12 NOTE — Plan of Care (Signed)
  Problem: Education: Goal: Knowledge of General Education information will improve Description: Including pain rating scale, medication(s)/side effects and non-pharmacologic comfort measures Outcome: Adequate for Discharge   Problem: Health Behavior/Discharge Planning: Goal: Ability to manage health-related needs will improve Outcome: Adequate for Discharge   Problem: Clinical Measurements: Goal: Ability to maintain clinical measurements within normal limits will improve Outcome: Adequate for Discharge Goal: Will remain free from infection Outcome: Adequate for Discharge Goal: Diagnostic test results will improve Outcome: Adequate for Discharge Goal: Respiratory complications will improve Outcome: Adequate for Discharge Goal: Cardiovascular complication will be avoided Outcome: Adequate for Discharge   Problem: Activity: Goal: Risk for activity intolerance will decrease Outcome: Adequate for Discharge   Problem: Nutrition: Goal: Adequate nutrition will be maintained Outcome: Adequate for Discharge   Problem: Coping: Goal: Level of anxiety will decrease Outcome: Adequate for Discharge   Problem: Elimination: Goal: Will not experience complications related to bowel motility Outcome: Adequate for Discharge Goal: Will not experience complications related to urinary retention Outcome: Adequate for Discharge   Problem: Pain Managment: Goal: General experience of comfort will improve and/or be controlled Outcome: Adequate for Discharge   Problem: Safety: Goal: Ability to remain free from injury will improve Outcome: Adequate for Discharge   Problem: Skin Integrity: Goal: Risk for impaired skin integrity will decrease Outcome: Adequate for Discharge   Problem: Education: Goal: Ability to describe self-care measures that may prevent or decrease complications (Diabetes Survival Skills Education) will improve Outcome: Adequate for Discharge Goal: Individualized Educational  Video(s) Outcome: Adequate for Discharge   Problem: Coping: Goal: Ability to adjust to condition or change in health will improve Outcome: Adequate for Discharge   Problem: Fluid Volume: Goal: Ability to maintain a balanced intake and output will improve Outcome: Adequate for Discharge   Problem: Health Behavior/Discharge Planning: Goal: Ability to identify and utilize available resources and services will improve Outcome: Adequate for Discharge Goal: Ability to manage health-related needs will improve Outcome: Adequate for Discharge   Problem: Metabolic: Goal: Ability to maintain appropriate glucose levels will improve Outcome: Adequate for Discharge   Problem: Nutritional: Goal: Maintenance of adequate nutrition will improve Outcome: Adequate for Discharge Goal: Progress toward achieving an optimal weight will improve Outcome: Adequate for Discharge   Problem: Skin Integrity: Goal: Risk for impaired skin integrity will decrease Outcome: Adequate for Discharge   Problem: Tissue Perfusion: Goal: Adequacy of tissue perfusion will improve Outcome: Adequate for Discharge   Problem: Education: Goal: Knowledge of the procedure and recovery process will improve Outcome: Adequate for Discharge   Problem: Bowel/Gastric: Goal: Gastrointestinal status for postoperative course will improve Outcome: Adequate for Discharge   Problem: Pain Management: Goal: General experience of comfort will improve Outcome: Adequate for Discharge   Problem: Skin Integrity: Goal: Demonstration of wound healing without infection will improve Outcome: Adequate for Discharge   Problem: Urinary Elimination: Goal: Ability to avoid or minimize complications of infection will improve Outcome: Adequate for Discharge Goal: Ability to achieve and maintain urine output will improve Outcome: Adequate for Discharge Goal: Home care management will improve Outcome: Adequate for Discharge

## 2023-12-12 NOTE — TOC Transition Note (Addendum)
Transition of Care [facility]) - Discharge Note   Patient Details  Name: Robert [name] MRN: [id number] Date of Birth: September 15, 1951  Transition of Care [facility]) CM/SW Contact:  Lanier Clam, RN Phone Number: 12/12/2023, 9:13 AM   Clinical Narrative:  No preference for [facility] agency-[facility] rep Adele Dan accepted. No further CM needs. -9:36a I have set up [facility] home visit likely Thursday d/t weather tomorrow.-please leave patient with supplies for ostomy.     Final next level of care: Home w Home Health Services Barriers to Discharge: No Barriers Identified   Patient Goals and CMS Choice Patient states their goals for this hospitalization and ongoing recovery are:: Home CMS Medicare.gov Compare Post Acute Care list provided to:: Patient Choice offered to / list presented to : Patient [place] ownership interest in [facility].provided to:: Patient    Discharge Placement                       Discharge Plan and Services Additional resources added to the After Visit Summary for     Discharge Planning Services: CM Consult                      HH Arranged: RN [facility] Agency: [facility] ([facility]) Date [facility] Agency Contacted: 12/12/23 Time HH Agency Contacted: [phone] Representative spoke with at [facility] Agency: Adele Dan  Social Drivers of Health (SDOH) Interventions SDOH Screenings   Food Insecurity: No Food Insecurity (12/10/2023)  Housing: Low Risk  (12/10/2023)  Recent Concern: Housing - High Risk (12/07/2023)  Transportation Needs: No Transportation Needs (12/10/2023)  Utilities: Not At Risk (12/10/2023)  Alcohol Screen: Low Risk  (10/08/2023)  Depression (PHQ2-9): Low Risk  (10/09/2023)  Financial Resource Strain: Low Risk  (10/08/2023)  Physical Activity: Insufficiently Active (10/08/2023)  Social Connections: Unknown (12/07/2023)  Stress: No Stress Concern Present (10/08/2023)  Tobacco Use: High Risk (12/08/2023)     Readmission Risk Interventions     12/10/2023    4:50 PM  Readmission Risk Prevention Plan  Transportation Screening Complete  PCP or Specialist Appt within 5-7 Days Complete  Home Care Screening Complete  Medication Review (RN CM) Complete

## 2023-12-12 NOTE — Discharge Summary (Signed)
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.9], Right Renal Pelvis, Ureteral Cancer  Secondary diagnoses:   Active Ambulatory Problems    Diagnosis Date Noted   Hyperlipidemia 08/21/2007   ERECTILE DYSFUNCTION 12/19/2008   Essential hypertension 08/21/2007   SKIN CANCER, HX OF 12/19/2008   Hx of colonic polyps    Irregular heart rate 06/23/2011   BPH associated with nocturia 03/01/2017   Type 2 diabetes mellitus without complication, without long-term current use of insulin (HCC) 07/23/2018   Overweight (BMI 25.0-29.9) 01/03/2019   Long term current use of anticoagulant -Eliquis 03/12/2019   Paroxysmal atrial fibrillation (HCC) 03/12/2019   Primary osteoarthritis of right hip 10/19/2021   Bladder cancer (HCC) 01/31/2022   Osteoarthritis of right hip 06/29/2022   Preop examination 02/27/2023   Elevated PSA measurement 02/28/2023   Port-A-Cath in place 06/09/2023   Hypomagnesemia 07/03/2023   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 10/09/2023   Resolved Ambulatory Problems    Diagnosis Date Noted   DM w/o Complication Type [name] 12/10/2007   Backache 03/31/2010   Skin cancer    Snoring 03/12/2013   Viral URI 08/06/2013   Poorly controlled type 2 diabetes mellitus with peripheral neuropathy (HCC) 07/07/2015   Pain in right lower leg 03/01/2017   Obesity 04/30/2018   Past Medical History:  Diagnosis Date   Anticoagulant long-term use    Arthritis    History of basal cell carcinoma (BCC) excision    History of COVID-19 05/2019   HLD (hyperlipidemia)    HTN (hypertension)    Insulin dependent type 2 diabetes mellitus (HCC)    Nocturia    Ureterocele    Use of cane as ambulatory aid      History and Physical: For full details, please see admission history and physical. Briefly, Robert [name] is a 73 y.o. 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   Hospital Course: Pt admitted and underwent right nephroureterectomy, cystoprostatectomy, urethrectomy, and left cutaneuous ureterostomy on 12/08/2023. Their hospital course was unremarkable.  POD1, pt was able to tolerate CLD and ambulate. Was transitioned to regular diet evening of POD2, and had first BM on POD2.   His UOP remained clear with his ureteral stent in place. He received appropriate WOCN management prior to discharge.   His JP creatinine was negative on POD3 and drain was subsequently removed.   On the day of discharge, the patient was tolerating a regular diet and their pain was well controlled. They were determined to be stable for discharge home  He will resume his eliquis tomorrow (12/13/2023).   He has follow-up scheduled on 12/26/2023.   Laboratory values:  Recent Labs    12/10/23 0313 12/11/23 0318 12/12/23 0317  HGB 11.1* 11.1* 10.1*  HCT 32.8* 33.1* 30.5*   Recent Labs    12/11/23 0318 12/12/23 0317  CREATININE 1.25* 1.42*    Disposition: Home  Discharge medications:  Allergies as of 12/12/2023       Reactions   Lisinopril Swelling        Medication List     TAKE these medications    amLODipine 5 MG tablet Commonly known as: NORVASC Take 1 tablet (5 mg total) by mouth daily.   atorvastatin 40 MG tablet Commonly known as:  LIPITOR TAKE ONE TABLET BY MOUTH ONCE DAILY What changed: when to take this   B-complex with vitamin C tablet Take 1 tablet by mouth daily.   dapagliflozin propanediol 10 MG Tabs tablet Commonly known as: Farxiga Take 1 tablet (10 mg total) by mouth daily before breakfast.   Eliquis 5 MG Tabs tablet Generic drug: apixaban TAKE ONE TABLET BY MOUTH TWICE DAILY   empagliflozin 10 MG Tabs tablet Commonly known as: Jardiance Take 1 tablet  (10 mg total) by mouth daily before breakfast.   EPINEPHrine 0.3 mg/0.3 mL Soaj injection Commonly known as: EPI-PEN Inject 0.3 mg into the muscle as needed for anaphylaxis.   glipiZIDE 2.5 MG 24 hr tablet Commonly known as: GLUCOTROL XL Take 2.5 mg by mouth daily with breakfast.   glucose blood test strip 1 each by Other route daily. Use daily for glucose control - Dx 250.00   metFORMIN 500 MG 24 hr tablet Commonly known as: GLUCOPHAGE-XR Take 4 tablets (2,000 mg total) by mouth daily with supper. What changed: how much to take   metoprolol succinate 25 MG 24 hr tablet Commonly known as: TOPROL-XL Take 1 tablet (25 mg total) by mouth daily.   oxyCODONE 5 MG immediate release tablet Commonly known as: Oxy IR/ROXICODONE Take 1 tablet (5 mg total) by mouth every 4 (four) hours as needed for breakthrough pain (post-operatively).   Ozempic (1 MG/DOSE) 4 MG/3ML Sopn Generic drug: Semaglutide (1 MG/DOSE) INJECT 1 MG UNDER THE SKIN ONCE A WEEK AS DIRECTED   propranolol 10 MG tablet Commonly known as: INDERAL TAKE 1 TABLET BY MOUTH 4 TIMES DAILY AS NEEDED FOR PALPITATIONS, IRREGULAR OR FAST HEART RATE   tadalafil 5 MG tablet Commonly known as: Cialis Take 1 tablet (5 mg total) by mouth daily. What changed:  when to take this reasons to take this   traMADol 50 MG tablet Commonly known as: ULTRAM Take 1 tablet (50 mg total) by mouth every 6 (six) hours as needed for severe pain.   VITAMIN D-3 PO Take 1 tablet by mouth daily.        Followup:   Follow-up Information     Berneice Heinrich Delbert Phenix., MD Follow up on 12/26/2023.   Specialty: Urology Why: at 12:45 for MD visit Contact information: [address] [place] [address] [phone]         [facility] Follow up.   Why: HH nursing Contact information: [address] [place] [address] [phone]

## 2023-12-13 ENCOUNTER — Telehealth: Payer: Self-pay | Admitting: *Deleted

## 2023-12-13 DIAGNOSIS — R351 Nocturia: Secondary | ICD-10-CM | POA: Diagnosis not present

## 2023-12-13 DIAGNOSIS — I10 Essential (primary) hypertension: Secondary | ICD-10-CM | POA: Diagnosis not present

## 2023-12-13 DIAGNOSIS — F17210 Nicotine dependence, cigarettes, uncomplicated: Secondary | ICD-10-CM | POA: Diagnosis not present

## 2023-12-13 DIAGNOSIS — C679 Malignant neoplasm of bladder, unspecified: Secondary | ICD-10-CM | POA: Diagnosis not present

## 2023-12-13 DIAGNOSIS — Z483 Aftercare following surgery for neoplasm: Secondary | ICD-10-CM | POA: Diagnosis not present

## 2023-12-13 DIAGNOSIS — N401 Enlarged prostate with lower urinary tract symptoms: Secondary | ICD-10-CM | POA: Diagnosis not present

## 2023-12-13 DIAGNOSIS — E119 Type 2 diabetes mellitus without complications: Secondary | ICD-10-CM | POA: Diagnosis not present

## 2023-12-13 DIAGNOSIS — I480 Paroxysmal atrial fibrillation: Secondary | ICD-10-CM | POA: Diagnosis not present

## 2023-12-13 DIAGNOSIS — Z436 Encounter for attention to other artificial openings of urinary tract: Secondary | ICD-10-CM | POA: Diagnosis not present

## 2023-12-13 NOTE — Transitions of Care (Post Inpatient/ED Visit) (Signed)
12/13/2023  Name: Robert [name] MRN: [id number] DOB: 1950-11-06  Today's TOC FU Call Status: Today's TOC FU Call Status:: Successful TOC FU Call Completed TOC FU Call Complete Date: 12/13/23 Patient's Name and Date of Birth confirmed.  Transition Care Management Follow-up Telephone Call Date of Discharge: 12/12/23 Discharge Facility: [facility]) Type of Discharge: Inpatient Admission Primary Inpatient Discharge Diagnosis:: cystoscopy  with uretal stent/ (L) ureterostomy secondary to bladder CA How have you been since you were released from the hospital?: Better ("I am doing fine; the nurse came out today to my home and said everything looks good.  Not in pain.  Nurse will be coming weekly, so I don't need additional calls.  I am independent and have plenty of help here at home") Any questions or concerns?: No  Items Reviewed: Did you receive and understand the discharge instructions provided?: Yes (thoroughly reviewed with patient who verbalizes good understanding of same) Medications obtained,verified, and reconciled?: Yes (Medications Reviewed) (Full medication reconciliation/ review completed; no concerns or discrepancies identified; confirmed patient obtained/ is taking all newly Rx'd medications as instructed; self-manages medications and denies questions/ concerns around medications today) Any new allergies since your discharge?: No Dietary orders reviewed?: Yes Type of Diet Ordered:: "Regular, healthy" Do you have support at home?: Yes People in Home: spouse Name of Support/Comfort Primary Source: Reports independent in self-care activities; supportive spouse resides with patient- she has dementia-- assists as/ if needed/ indicated; patient reports he is his wife's primary caregiver  Medications Reviewed Today: Medications Reviewed Today     Reviewed by [name], RN (Registered Nurse) on 12/13/23 at 1344  Med List Status: <None>   Medication Order Taking?  Sig Documenting Provider Last Dose Status Informant  amLODipine (NORVASC) 5 MG tablet [id number] Yes Take 1 tablet (5 mg total) by mouth daily. Jacalyn Lefevre, MD Taking Active Self           Med Note [name] D   Tue Oct 03, 2023  3:22 PM)    atorvastatin (LIPITOR) 40 MG tablet [id number] Yes TAKE ONE TABLET BY MOUTH ONCE DAILY  Patient taking differently: Take 40 mg by mouth at bedtime.   Carlus Pavlov, MD Taking Active Self  B Complex-C (B-COMPLEX WITH VITAMIN C) tablet [id number] Yes Take 1 tablet by mouth daily. [provider] Taking Active Self           Med Note [name] A   Mon Dec 04, 2023  1:27 PM) On hold for procedure   Cholecalciferol (VITAMIN D-3 PO) [id number] Yes Take 1 tablet by mouth daily. [provider] Taking Active Self           Med Note Sherrie Mustache, Ronaldo Miyamoto A   Mon Dec 04, 2023  1:28 PM) On hold for procedure  dapagliflozin propanediol (FARXIGA) 10 MG TABS tablet [id number] No Take 1 tablet (10 mg total) by mouth daily before breakfast.  Patient not taking: Reported on 12/13/2023   Carlus Pavlov, MD Not Taking Active Self           Med Note [name]   Wed Dec 13, 2023  1:42 PM) 12/13/23:  Reports during [facility] call-- has not started, either jardiance or farxiga. Pt hasn't chosen which one he plans on taking yet- based on cost: reports he is to discuss with his endocrinology provider at time of upcoming provider office visit   ELIQUIS 5 MG TABS tablet [id number] Yes TAKE ONE TABLET BY MOUTH TWICE DAILY Nahser,  Deloris Ping, MD Taking Active Self  empagliflozin (JARDIANCE) 10 MG TABS tablet [id number] No Take 1 tablet (10 mg total) by mouth daily before breakfast.  Patient not taking: Reported on 12/13/2023   Carlus Pavlov, MD Not Taking Active Self           Med Note [name]   Wed Dec 13, 2023  1:42 PM) 12/13/23:  Reports during [facility] call-- has not started, either jardiance or farxiga. Pt hasn't chosen which one he plans on taking  yet- based on cost: reports he is to discuss with his endocrinology provider at time of upcoming provider office visit   EPINEPHrine 0.3 mg/0.3 mL IJ SOAJ injection [id number] Yes Inject 0.3 mg into the muscle as needed for anaphylaxis. Netta Corrigan, PA-C Taking Active Self  glipiZIDE (GLUCOTROL XL) 2.5 MG 24 hr tablet [id number] Yes Take 2.5 mg by mouth daily with breakfast. [provider] Taking Active Self  glucose blood test strip [id number] Yes 1 each by Other route daily. Use daily for glucose control - Dx 250.00 [name], [name], MD Taking Active Self  metFORMIN (GLUCOPHAGE-XR) 500 MG 24 hr tablet [id number] Yes Take 4 tablets (2,000 mg total) by mouth daily with supper.  Patient taking differently: Take 1,000 mg by mouth daily with supper.   Carlus Pavlov, MD Taking Active Self  metoprolol succinate (TOPROL-XL) 25 MG 24 hr tablet [id number] Yes Take 1 tablet (25 mg total) by mouth daily. Nahser, Deloris Ping, MD Taking Active Self  oxyCODONE (OXY IR/ROXICODONE) 5 MG immediate release tablet [id number] Yes Take 1 tablet (5 mg total) by mouth every 4 (four) hours as needed for breakthrough pain (post-operatively). Loletta Parish., MD Taking Active            Med Note [name] Dec 13, 2023  1:44 PM) 12/13/23:  Reports during [facility] call-- has not started/ picked up from his outpatient pharmacy- reports ready for pick up-- but he may not pick up-- reports tramadol is controlling his pain   propranolol (INDERAL) 10 MG tablet [id number] Yes TAKE 1 TABLET BY MOUTH 4 TIMES DAILY AS NEEDED FOR PALPITATIONS, IRREGULAR OR FAST HEART RATE Nahser, Deloris Ping, MD Taking Active Self  Semaglutide, 1 MG/DOSE, (OZEMPIC, 1 MG/DOSE,) 4 MG/3ML SOPN [id number] Yes INJECT 1 MG UNDER THE SKIN ONCE A WEEK AS DIRECTED Carlus Pavlov, MD Taking Active Self           Med Note [name] A   Mon Dec 04, 2023  1:20 PM) Thursdays  tadalafil (CIALIS) 5 MG tablet [id number] Yes Take 1  tablet (5 mg total) by mouth daily.  Patient taking differently: Take 5 mg by mouth daily as needed for erectile dysfunction.   Plotnikov, [name], MD Taking Active Self  traMADol (ULTRAM) 50 MG tablet [id number] Yes Take 1 tablet (50 mg total) by mouth every 6 (six) hours as needed for severe pain. Plotnikov, [name], MD Taking Active Self            Home Care and Equipment/Supplies: Were Home Health Services Ordered?: Yes Name of Home Health Agency:: [facility]- confirmed first visit today Has Agency set up a time to come to your home?: Yes First Home Health Visit Date: 12/13/23 Any new equipment or medical supplies ordered?: Yes Name of Medical supply agency?: "I am not sure; the nurse today said she is ordering all of my ostomy supllies; I have plenty supplies here at home" Were you  able to get the equipment/medical supplies?: Yes Do you have any questions related to the use of the equipment/supplies?: No  Functional Questionnaire: Do you need assistance with bathing/showering or dressing?: No Do you need assistance with meal preparation?: No Do you need assistance with eating?: No Do you have difficulty maintaining continence: No Do you need assistance with getting out of bed/getting out of a chair/moving?: No Do you have difficulty managing or taking your medications?: No  Follow up appointments reviewed: PCP Follow-up appointment confirmed?: NA (verified not indicated per hospital discharging provider discharge notes) Specialist Hospital Follow-up appointment confirmed?: Yes Date of Specialist follow-up appointment?: 12/26/23 Follow-Up Specialty Provider:: Dr. Berneice Heinrich- urology provider Do you need transportation to your follow-up appointment?: No Do you understand care options if your condition(s) worsen?: Yes-patient verbalized understanding  SDOH Interventions Today    Flowsheet Row Most Recent Value  SDOH Interventions   Food Insecurity Interventions Intervention  Not Indicated  Housing Interventions Intervention Not Indicated  [patient adamantly denies housing concerns during [facility] call today]  Transportation Interventions Intervention Not Indicated  [reports during [facility] call today: friends/ neighbors/ family assist with transportation needs,  reminded patient of potential transportation benefit through his insurance provider]  Utilities Interventions Intervention Not Indicated      Interventions Today    Flowsheet Row Most Recent Value  Chronic Disease   Chronic disease during today's visit Other  [Bladder CA with cystoscopy/ uretal stent/ ureterostomy]  General Interventions   General Interventions Discussed/Reviewed General Interventions Discussed, Durable Medical Equipment (DME), Doctor Visits  Doctor Visits Discussed/Reviewed Doctor Visits Discussed, Specialist, PCP  Durable Medical Equipment (DME) Other  [confirmed uses assistive devices on regular basis, at baseline -- cane]  PCP/Specialist Visits Compliance with follow-up visit  Education Interventions   Education Provided Provided Education  [role of home health services with importance of participation/ ongoing engagement]  Provided Verbal Education On [employer], When to see the doctor  Nutrition Interventions   Nutrition Discussed/Reviewed Nutrition Discussed  Pharmacy Interventions   Pharmacy Dicussed/Reviewed Pharmacy Topics Discussed  [Full medication review with updating medication list in EHR per patient report]  Safety Interventions   Safety Discussed/Reviewed Safety Discussed, Fall Risk  [provided education/ reinforcement around fall prevention]      TOC Interventions Today    Flowsheet Row Most Recent Value  TOC Interventions   TOC Interventions Discussed/Reviewed TOC Interventions Discussed  [Patient declines need for ongoing/ further care management outreach,  declines enrollment in 30-day TOC program,  provided my direct contact information should questions/ concerns/  needs arise post-TOC call]      Total time spent from review to signing of note/ including any care coordination interventions:  53 minutes/ includes computer connectivity issues  Caryl Pina, RN, BSN, Media planner  Transitions of Care  [facility]  [place] [phone]: direct office

## 2023-12-21 ENCOUNTER — Encounter: Payer: Self-pay | Admitting: Cardiovascular Disease

## 2023-12-21 DIAGNOSIS — R351 Nocturia: Secondary | ICD-10-CM | POA: Diagnosis not present

## 2023-12-21 DIAGNOSIS — C679 Malignant neoplasm of bladder, unspecified: Secondary | ICD-10-CM | POA: Diagnosis not present

## 2023-12-21 DIAGNOSIS — Z483 Aftercare following surgery for neoplasm: Secondary | ICD-10-CM | POA: Diagnosis not present

## 2023-12-21 DIAGNOSIS — E119 Type 2 diabetes mellitus without complications: Secondary | ICD-10-CM | POA: Diagnosis not present

## 2023-12-21 DIAGNOSIS — I480 Paroxysmal atrial fibrillation: Secondary | ICD-10-CM | POA: Diagnosis not present

## 2023-12-21 DIAGNOSIS — I10 Essential (primary) hypertension: Secondary | ICD-10-CM | POA: Diagnosis not present

## 2023-12-21 DIAGNOSIS — Z436 Encounter for attention to other artificial openings of urinary tract: Secondary | ICD-10-CM | POA: Diagnosis not present

## 2023-12-21 DIAGNOSIS — N401 Enlarged prostate with lower urinary tract symptoms: Secondary | ICD-10-CM | POA: Diagnosis not present

## 2023-12-21 DIAGNOSIS — F17210 Nicotine dependence, cigarettes, uncomplicated: Secondary | ICD-10-CM | POA: Diagnosis not present

## 2023-12-21 MED ORDER — APIXABAN 5 MG PO TABS: 5.0000 mg | ORAL_TABLET | Freq: Two times a day (BID) | ORAL | 1 refills | Status: AC

## 2023-12-21 NOTE — Telephone Encounter (Signed)
 Prescription refill request for Eliquis received. Indication: Afib  Last office visit: 10/18/23 (Nahser)  Scr: 1.42 (12/12/23)  Age: 73 Weight: 89.7kg  Appropriate dose. Refill sent.

## 2023-12-26 ENCOUNTER — Other Ambulatory Visit: Payer: Self-pay | Admitting: Cardiovascular Disease

## 2023-12-26 DIAGNOSIS — R8271 Bacteriuria: Secondary | ICD-10-CM | POA: Diagnosis not present

## 2023-12-26 DIAGNOSIS — C678 Malignant neoplasm of overlapping sites of bladder: Secondary | ICD-10-CM | POA: Diagnosis not present

## 2023-12-27 ENCOUNTER — Encounter (HOSPITAL_COMMUNITY): Payer: Self-pay

## 2023-12-28 ENCOUNTER — Ambulatory Visit (HOSPITAL_COMMUNITY)
Admission: RE | Admit: 2023-12-28 | Discharge: 2023-12-28 | Disposition: A | Discharge status: Home / Self Care | Payer: Medicare HMO | Source: Ambulatory Visit | Attending: Cardiovascular Disease | Admitting: Cardiovascular Disease

## 2023-12-28 ENCOUNTER — Other Ambulatory Visit: Payer: Self-pay | Admitting: Cardiovascular Disease

## 2023-12-28 DIAGNOSIS — I422 Other hypertrophic cardiomyopathy: Secondary | ICD-10-CM

## 2023-12-28 MED ORDER — GADOBUTROL 1 MMOL/ML IV SOLN
9.0000 mL | Freq: Once | INTRAVENOUS | Status: AC | PRN
  Administered 2023-12-28: 9 mL via INTRAVENOUS

## 2024-01-01 DIAGNOSIS — C679 Malignant neoplasm of bladder, unspecified: Secondary | ICD-10-CM | POA: Diagnosis not present

## 2024-01-01 DIAGNOSIS — N401 Enlarged prostate with lower urinary tract symptoms: Secondary | ICD-10-CM | POA: Diagnosis not present

## 2024-01-01 DIAGNOSIS — F17210 Nicotine dependence, cigarettes, uncomplicated: Secondary | ICD-10-CM | POA: Diagnosis not present

## 2024-01-01 DIAGNOSIS — E119 Type 2 diabetes mellitus without complications: Secondary | ICD-10-CM | POA: Diagnosis not present

## 2024-01-01 DIAGNOSIS — Z483 Aftercare following surgery for neoplasm: Secondary | ICD-10-CM | POA: Diagnosis not present

## 2024-01-01 DIAGNOSIS — Z436 Encounter for attention to other artificial openings of urinary tract: Secondary | ICD-10-CM | POA: Diagnosis not present

## 2024-01-01 DIAGNOSIS — I480 Paroxysmal atrial fibrillation: Secondary | ICD-10-CM | POA: Diagnosis not present

## 2024-01-01 DIAGNOSIS — I10 Essential (primary) hypertension: Secondary | ICD-10-CM | POA: Diagnosis not present

## 2024-01-01 DIAGNOSIS — R351 Nocturia: Secondary | ICD-10-CM | POA: Diagnosis not present

## 2024-01-02 ENCOUNTER — Ambulatory Visit (HOSPITAL_COMMUNITY)
Admission: RE | Admit: 2024-01-02 | Discharge: 2024-01-02 | Disposition: A | Discharge status: Home / Self Care | Source: Ambulatory Visit | Attending: Plastic Surgery | Admitting: Plastic Surgery

## 2024-01-02 NOTE — Discharge Instructions (Signed)
 1 piece convex pouch Barrier ring Order adhesive remover Ostomy belt Set up with [facility]

## 2024-01-03 ENCOUNTER — Encounter: Payer: Self-pay | Admitting: Cardiovascular Disease

## 2024-01-08 ENCOUNTER — Other Ambulatory Visit (HOSPITAL_COMMUNITY): Payer: Self-pay | Admitting: Nurse Practitioner

## 2024-01-08 DIAGNOSIS — C679 Malignant neoplasm of bladder, unspecified: Secondary | ICD-10-CM | POA: Diagnosis not present

## 2024-01-08 DIAGNOSIS — Z483 Aftercare following surgery for neoplasm: Secondary | ICD-10-CM | POA: Diagnosis not present

## 2024-01-08 DIAGNOSIS — I480 Paroxysmal atrial fibrillation: Secondary | ICD-10-CM | POA: Diagnosis not present

## 2024-01-08 DIAGNOSIS — I10 Essential (primary) hypertension: Secondary | ICD-10-CM | POA: Diagnosis not present

## 2024-01-08 DIAGNOSIS — E119 Type 2 diabetes mellitus without complications: Secondary | ICD-10-CM | POA: Diagnosis not present

## 2024-01-08 DIAGNOSIS — F17210 Nicotine dependence, cigarettes, uncomplicated: Secondary | ICD-10-CM | POA: Diagnosis not present

## 2024-01-08 DIAGNOSIS — N99528 Other complication of other external stoma of urinary tract: Secondary | ICD-10-CM

## 2024-01-08 DIAGNOSIS — N401 Enlarged prostate with lower urinary tract symptoms: Secondary | ICD-10-CM | POA: Diagnosis not present

## 2024-01-08 DIAGNOSIS — Z436 Encounter for attention to other artificial openings of urinary tract: Secondary | ICD-10-CM | POA: Diagnosis not present

## 2024-01-08 DIAGNOSIS — R351 Nocturia: Secondary | ICD-10-CM | POA: Diagnosis not present

## 2024-01-11 DIAGNOSIS — Z8551 Personal history of malignant neoplasm of bladder: Secondary | ICD-10-CM | POA: Diagnosis not present

## 2024-01-11 DIAGNOSIS — Z936 Other artificial openings of urinary tract status: Secondary | ICD-10-CM | POA: Diagnosis not present

## 2024-01-16 ENCOUNTER — Encounter: Payer: Self-pay | Admitting: Hematology

## 2024-01-16 DIAGNOSIS — R8271 Bacteriuria: Secondary | ICD-10-CM | POA: Diagnosis not present

## 2024-01-16 DIAGNOSIS — L821 Other seborrheic keratosis: Secondary | ICD-10-CM | POA: Diagnosis not present

## 2024-01-16 DIAGNOSIS — D2262 Melanocytic nevi of left upper limb, including shoulder: Secondary | ICD-10-CM | POA: Diagnosis not present

## 2024-01-16 DIAGNOSIS — L814 Other melanin hyperpigmentation: Secondary | ICD-10-CM | POA: Diagnosis not present

## 2024-01-16 DIAGNOSIS — L565 Disseminated superficial actinic porokeratosis (DSAP): Secondary | ICD-10-CM | POA: Diagnosis not present

## 2024-01-16 DIAGNOSIS — D225 Melanocytic nevi of trunk: Secondary | ICD-10-CM | POA: Diagnosis not present

## 2024-01-16 DIAGNOSIS — D485 Neoplasm of uncertain behavior of skin: Secondary | ICD-10-CM | POA: Diagnosis not present

## 2024-01-17 ENCOUNTER — Telehealth: Payer: Self-pay

## 2024-01-17 NOTE — Telephone Encounter (Addendum)
 Called Dr. Tora Kindred office to get last office note faxed over to [place]. Note faxed and placed on Dr. Georga Bora desk.    ----- Message from Malachy Mood sent at 01/17/2024 10:32 AM EDT ----- Thanks Clayborn Heron.  Evalette Montrose, could you request his urologist Dr. Emmaline Life recent office note? Manuella Ghazi ----- Message ----- From: Pollyann Samples, NP Sent: 01/17/2024   8:35 AM EDT To: Malachy Mood, MD; Vick Frees; Cindie Crumbly and Erie Noe,  Please schedule lab, f/up with me/heather/Dr. Mosetta Putt asap for post op path review, he is 6 weeks out.   Thanks [employer]

## 2024-01-22 ENCOUNTER — Telehealth: Payer: Self-pay | Admitting: Hematology

## 2024-01-23 DIAGNOSIS — L988 Other specified disorders of the skin and subcutaneous tissue: Secondary | ICD-10-CM | POA: Diagnosis not present

## 2024-01-23 DIAGNOSIS — D485 Neoplasm of uncertain behavior of skin: Secondary | ICD-10-CM | POA: Diagnosis not present

## 2024-01-25 ENCOUNTER — Encounter: Payer: Self-pay | Admitting: Hematology

## 2024-01-25 ENCOUNTER — Inpatient Hospital Stay

## 2024-01-25 ENCOUNTER — Inpatient Hospital Stay: Attending: Hematology

## 2024-01-25 ENCOUNTER — Inpatient Hospital Stay (HOSPITAL_BASED_OUTPATIENT_CLINIC_OR_DEPARTMENT_OTHER): Admitting: Hematology

## 2024-01-25 VITALS — BP 135/55 | HR 70 | Temp 98.8°F | Resp 17 | Ht 74.0 in | Wt 206.5 lb

## 2024-01-25 DIAGNOSIS — C675 Malignant neoplasm of bladder neck: Secondary | ICD-10-CM | POA: Insufficient documentation

## 2024-01-25 DIAGNOSIS — N189 Chronic kidney disease, unspecified: Secondary | ICD-10-CM | POA: Insufficient documentation

## 2024-01-25 DIAGNOSIS — Z79620 Long term (current) use of immunosuppressive biologic: Secondary | ICD-10-CM | POA: Diagnosis not present

## 2024-01-25 DIAGNOSIS — C61 Malignant neoplasm of prostate: Secondary | ICD-10-CM | POA: Insufficient documentation

## 2024-01-25 DIAGNOSIS — Z5112 Encounter for antineoplastic immunotherapy: Secondary | ICD-10-CM | POA: Diagnosis not present

## 2024-01-25 DIAGNOSIS — C679 Malignant neoplasm of bladder, unspecified: Secondary | ICD-10-CM

## 2024-01-25 DIAGNOSIS — Z452 Encounter for adjustment and management of vascular access device: Secondary | ICD-10-CM | POA: Insufficient documentation

## 2024-01-25 DIAGNOSIS — Z95828 Presence of other vascular implants and grafts: Secondary | ICD-10-CM

## 2024-01-25 LAB — CMP (CANCER CENTER ONLY)
ALT: 9 U/L (ref 0–44)
AST: 11 U/L — ABNORMAL LOW (ref 15–41)
Albumin: 4.1 g/dL (ref 3.5–5.0)
Alkaline Phosphatase: 70 U/L (ref 38–126)
Anion gap: 6 (ref 5–15)
BUN: 30 mg/dL — ABNORMAL HIGH (ref 8–23)
CO2: 27 mmol/L (ref 22–32)
Calcium: 9.3 mg/dL (ref 8.9–10.3)
Chloride: 107 mmol/L (ref 98–111)
Creatinine: 1.64 mg/dL — ABNORMAL HIGH (ref 0.61–1.24)
GFR, Estimated: 44 mL/min — ABNORMAL LOW (ref >60)
Glucose, Bld: 220 mg/dL — ABNORMAL HIGH (ref 70–99)
Potassium: 4.2 mmol/L (ref 3.5–5.1)
Sodium: 140 mmol/L (ref 135–145)
Total Bilirubin: 0.5 mg/dL (ref 0.0–1.2)
Total Protein: 6.8 g/dL (ref 6.5–8.1)

## 2024-01-25 LAB — CBC WITH DIFFERENTIAL (CANCER CENTER ONLY)
Abs Immature Granulocytes: 0.03 10*3/uL (ref 0.00–0.07)
Basophils Absolute: 0 10*3/uL (ref 0.0–0.1)
Basophils Relative: 1 %
Eosinophils Absolute: 0 10*3/uL (ref 0.0–0.5)
Eosinophils Relative: 1 %
HCT: 32.2 % — ABNORMAL LOW (ref 39.0–52.0)
Hemoglobin: 10.8 g/dL — ABNORMAL LOW (ref 13.0–17.0)
Immature Granulocytes: 0 %
Lymphocytes Relative: 30 %
Lymphs Abs: 2.3 10*3/uL (ref 0.7–4.0)
MCH: 29 pg (ref 26.0–34.0)
MCHC: 33.5 g/dL (ref 30.0–36.0)
MCV: 86.3 fL (ref 80.0–100.0)
Monocytes Absolute: 0.6 10*3/uL (ref 0.1–1.0)
Monocytes Relative: 8 %
Neutro Abs: 4.6 10*3/uL (ref 1.7–7.7)
Neutrophils Relative %: 60 %
Platelet Count: 175 10*3/uL (ref 150–400)
RBC: 3.73 MIL/uL — ABNORMAL LOW (ref 4.22–5.81)
RDW: 13.4 % (ref 11.5–15.5)
WBC Count: 7.5 10*3/uL (ref 4.0–10.5)
nRBC: 0 % (ref 0.0–0.2)

## 2024-01-25 MED ORDER — SODIUM CHLORIDE 0.9% FLUSH
10.0000 mL | INTRAVENOUS | Status: DC | PRN
  Administered 2024-01-25: 10 mL

## 2024-01-25 MED ORDER — HEPARIN SOD (PORK) LOCK FLUSH 100 UNIT/ML IV SOLN
500.0000 [IU] | Freq: Once | INTRAVENOUS | Status: AC | PRN
  Administered 2024-01-25: 500 [IU]

## 2024-01-25 NOTE — Progress Notes (Signed)
 [facility]   Telephone:([phone]) [phone] Fax:(336) [phone]   Clinic Follow up Note   Patient Care Team: Plotnikov, [name], MD as PCP - General (Internal Medicine) Nahser, Deloris Ping, MD as PCP - Cardiology (Cardiology) Verner Chol, [facility] (Inactive) as Pharmacist (Pharmacist) Carlus Pavlov, MD as Consulting Physician (Internal Medicine) Jerilee Field, MD as Consulting Physician (Urology) Iva Boop, MD as Consulting Physician (Gastroenterology) Janet Berlin, MD as Consulting Physician (Ophthalmology) Malachy Mood, MD as Attending Physician (Hematology and Oncology)  Date of Service:  01/25/2024  CHIEF COMPLAINT: f/u of bladder cancer  CURRENT THERAPY:  Pending adjuvant nivolumab  Oncology History   Bladder cancer [facility]) Bladder cancer (HCC) -cT2N0M0 stage II diagnosed in 04/2023 -He has had 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 -He is clinically doing well, asymptomatic.  Surgical resection was discussed with patient, but he would like to try nonsurgical options. -He started [facility] on 01/12/23, stopped in June 2024 due to disease progression. 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 -I recommended neoadjuvant chemotherapy with cisplatin and gemcitabine for 4 cycles, followed by total cystectomy and prostatectomy. -He started chemotherapy on June 08, 2023, and completed on 08/17/2023, overall tolerated  well  -He underwent right robotic right nephroureterectomy, robotic radical cystectomy with total urethrectomy, bilateral pelvic lymph node dissection, and left cutaneous ureterostomy by Dr. Berneice Heinrich on 12/08/2023.. surgical path showed multifocal high-grade urothelial carcinoma, right renal pelvis, ureter, bladder, and prostatic urethra, surgical margines negative, 1 positive node     Assessment and Plan    Bladder cancer with lymph node involvement He underwent extensive surgery for bladder cancer, including removal of the bladder, prostate, kidney, and lymph nodes. Pathology revealed one positive lymph node, indicating a moderate risk of recurrence. The cancer was not completely eradicated but was small and present in different locations, including the kidney, urethra, and bladder. Margins were negative, suggesting complete surgical removal of visible disease. Immunotherapy is recommended to reduce the risk of recurrence, as it has shown significant improvement in cancer-free survival in clinical trials, cutting the risk by almost half. He previously tolerated Keytruda well, and either Keytruda or nivolumab is considered appropriate. Nivolumab offers a convenience option as a newly approved injection, pending insurance approval. - Recommend immunotherapy with either Keytruda or nivolumab for one year. - Discuss potential side effects of immunotherapy, including thyroid dysfunction, lung inflammation (up to 20% risk), endocrine organ attack, joint pain, and skin rash. - Monitor thyroid function and watch for symptoms of lung inflammation, such as breathing difficulties, cough, or shortness of breath. - Check insurance approval for nivolumab injection as a convenience option. - Schedule immunotherapy sessions every six weeks if using high-dose Keytruda. - Perform scans every six months to monitor for recurrence.  Prostate cancer, pT2N0M0, Gleason score 3+4=7 His prostate cancer was early stage (T2) and  does not require further adjuvant treatment at this time.  Plan -I reviewed his surgical pathology findings.  I recommend adjuvant nivolumab for 1 year -plan to start in 1-2 weeks  -  f/u with second cycle tx         SUMMARY OF ONCOLOGIC HISTORY: Oncology History Overview Note   Cancer Staging  Bladder cancer [facility]) Staging form: Urinary Bladder, AJCC 8th Edition - Clinical stage from 05/14/2021: Stage 0a (cTa, cN0, cM0) - Signed by Malachy Mood, MD on 01/02/2023 Stage prefix: Initial diagnosis WHO/ISUP grade (low/high): High Grade Histologic grading system: 2 grade system - Pathologic stage from 02/22/2022: Stage I (pT1, pN0, cM0) - Signed by Malachy Mood, MD on 01/02/2023 WHO/ISUP grade (low/high): High Grade Histologic grading system: 2 grade system     Bladder cancer (HCC)  05/14/2021 Cancer Staging   Staging form: Urinary Bladder, AJCC 8th Edition - Clinical stage from 05/14/2021: Stage 0a (cTa, cN0, cM0) - Signed by Malachy Mood, MD on 01/02/2023 Stage prefix: Initial diagnosis WHO/ISUP grade (low/high): High Grade Histologic grading system: 2 grade system   01/31/2022 Initial Diagnosis   Bladder cancer (HCC)   02/22/2022 Cancer Staging   Staging form: Urinary Bladder, AJCC 8th Edition - Pathologic stage from 02/22/2022: Stage I (pT1, pN0, cM0) - Signed by Malachy Mood, MD on 01/02/2023 WHO/ISUP grade (low/high): High Grade Histologic grading system: 2 grade system   01/12/2023 - 04/06/2023 Chemotherapy   Patient is on Treatment Plan : BLADDER Pembrolizumab (200) q21d     05/05/2023 Pathology Results     FINAL MICROSCOPIC DIAGNOSIS:   A. BLADDER, RIGHT POSTERIOR, TURBT:  Noninvasive high grade papillary urothelial carcinoma  Muscularis propria (detrusor muscle) is not present   B. BLADDER NECK AND PROSTATIC URETHRA, BIOPSY:  High grade urothelial carcinoma  The carcinoma focally invades muscularis propria (detrusor muscle)     05/05/2023 Cancer Staging   Staging form: Urinary  Bladder, AJCC 8th Edition - Clinical stage from 05/05/2023: Stage II (cT2, cN0, cM0) - Signed by Malachy Mood, MD on 06/15/2023 WHO/ISUP grade (low/high): High Grade Histologic grading system: 2 grade system   06/01/2023 Imaging   CT Chest, Abdomen, and Pelvis with contrast  IMPRESSION: 1. Similar irregular asymmetric wall thickening along the posterior urinary bladder with progressive irregular thickening of the right ureter extending to the level of the renal pelvis where there is now a masslike 16 x 12 mm soft tissue focus, suspicious for direct ureteral extension of disease into the renal pelvis. 2. Irregular asymmetric wall thickening along the posterior urinary bladder measuring 14 mm is similar prior. 3. Right double-J ureteral stent is in place with persistent perinephric/periureteric stranding without significant hydronephrosis. 4. No convincing evidence of metastatic disease in the chest, abdomen or pelvis 5. Pancolonic diverticulosis without findings of acute diverticulitis. 6. Aortic Atherosclerosis (ICD10-I70.0) and Emphysema (ICD10-J43.9).   06/09/2023 - 08/17/2023 Chemotherapy   Patient is on Treatment Plan : BLADDER Cisplatin D1 + Gemcitabine D1,8 q21d x 4 Cycles     02/01/2024 -  Chemotherapy   Patient is on Treatment Plan : BLADDER Nivolumab (480) q28d        Discussed the use of AI scribe software for clinical note transcription with the patient, who gave verbal consent to proceed.  History of Present Illness   The patient, a 73 year old with a history of blood cancer, presents for a follow-up visit after undergoing extensive surgery approximately two months ago. The surgery involved the removal of the bladder, prostate, kidney, and some lymph nodes. The patient reports a smooth recovery with no complications or need for antibiotics. He was discharged from the hospital after five days and has been managing well with a urinary bag, with no  reported leakage.  The cancer was also  found in the kidney, urethra, and bladder.  The patient also has a history of early-stage prostate cancer, which does not require any treatment at this time. He has previously received immunotherapy Rande Lawman) for bladder cancer, which was well-tolerated. The patient also reports having arthritis in the hip, which is believed to be osteoarthritis rather than rheumatoid arthritis.         All other systems were reviewed with the patient and are negative.  MEDICAL HISTORY:  Past Medical History:  Diagnosis Date   Anticoagulant long-term use    eliquis--- managed by cardiology   Arthritis    Bladder cancer 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[facility])    endocrinologist--- dr Elvera Lennox --  (03-11-2022 per pt check blood sugar 1-2 times daily,  fasting sugar-- 118-120s)   Nocturia    Paroxysmal atrial fibrillation (HCC) 03/12/2019   cardiologist--- dr Melburn Popper   Ureterocele    Use of cane as ambulatory aid     SURGICAL HISTORY: Past Surgical History:  Procedure Laterality Date   BLEPHAROPLASTY Bilateral    per pt approx  2001;   upper eyelid's   COLONOSCOPY     last one approx 2023   CYSTOSCOPY W/ RETROGRADES N/A 12/08/2023   Procedure: CYSTOSCOPY;  Surgeon: Loletta Parish., MD;  Location: [facility];  Service: Urology;  Laterality: N/A;  390 MINUTES NEEDED FOR CASE   CYSTOSCOPY WITH URETEROSCOPY AND STENT PLACEMENT Right 12/02/2022   Procedure: CYSTOSCOPY WITH RIGHT URETEROSCOPY, RIGHT DIAGNOSTIC URETEROSCOPY;  Surgeon: Jerilee Field, MD;  Location: [facility];  Service: Urology;  Laterality: Right;  90 MINS FOR CASE   CYSTOSCOPY/RETROGRADE/URETEROSCOPY Bilateral 05/05/2023   Procedure: CYSTOSCOPY BILATERAL RETROGRADE PYELOGRAM RIGHT  URETEROSCOPY RIGHT STENT PLACEMENT;  Surgeon: Jerilee Field, MD;  Location: [facility];  Service: Urology;  Laterality: Bilateral;  90 MINS FOR CASE   IR IMAGING GUIDED PORT INSERTION  05/30/2023   PATELLAR TENDON REPAIR Left 04/16/2004   @[facility]    ROBOT ASSITED LAPAROSCOPIC NEPHROURETERECTOMY Right 12/08/2023   Procedure: RIGHT ROBOT ASSISTED Nephroureterectomy;  Surgeon: Loletta Parish., MD;  Location: [facility];  Service: Urology;  Laterality: Right;   ROBOT LAP RADICAL CYSTOPROSTATECTOMY PELVIC LYMPHADENECTOMY, NEOBLADDER  12/08/2023   Procedure: ROBOT ASSISTED LAPAROSCOPIC RADICAL CYSTOPROSTATECTOMY;  Surgeon: Loletta Parish., MD;  Location: [facility];  Service: Urology;;   TOTAL URETHRECTOMY,RADICAL N/A 12/08/2023   Procedure: TOTAL URETHRECTOMY,RADICAL;  Surgeon: Loletta Parish., MD;  Location: [facility];  Service: Urology;  Laterality: N/A;   TRANSURETHRAL RESECTION OF BLADDER TUMOR N/A 06/18/2021   Procedure: TRANSURETHRAL RESECTION OF BLADDER TUMOR (TURBT) WITH RIGHT URETERAL STENT PLACEMENT, RIGHT URETEROSCOPY WITH DISTRACTION OF TUMOR, FULGURATION;  Surgeon: Jerilee Field, MD;  Location: [facility];  Service: Urology;  Laterality: N/A;   TRANSURETHRAL RESECTION OF BLADDER TUMOR N/A 03/15/2022   Procedure: TRANSURETHRAL RESECTION OF BLADDER TUMOR (TURBT) WITH BILATERAL URETEROSCOPY/URETHRAL/ DILATION/BILATERAL RETROGRADE PYELOGRAM/BIOPSY AND FULGURATION OF RIGHT URETERAL CANCER/RIGHT STENT PLACEMENT;  Surgeon: Jerilee Field, MD;  Location: [facility];  Service: Urology;  Laterality: N/A;   TRANSURETHRAL RESECTION OF BLADDER TUMOR N/A 05/05/2023   Procedure: TRANSURETHRAL RESECTION OF  BLADDER TUMOR (TURBT) BLADDER BIOSPY;  Surgeon: Jerilee Field, MD;  Location: [facility];  Service: Urology;  Laterality: N/A;   TRANSURETHRAL RESECTION OF BLADDER TUMOR WITH MITOMYCIN-C N/A 12/02/2022   Procedure: TRANSURETHRAL  RESECTION OF BLADDER TUMOR;  Surgeon: Jerilee Field, MD;  Location: [facility];  Service: Urology;  Laterality: N/A;   TRANSURETHRAL RESECTION OF PROSTATE  05/05/2023   Procedure: TRANSURETHRAL RESECTION OF THE PROSTATE (TURP);  Surgeon: Jerilee Field, MD;  Location: [facility];  Service: Urology;;    I have reviewed the social history and family history with the patient and they are unchanged from previous note.  ALLERGIES:  is allergic to lisinopril.  MEDICATIONS:  Current Outpatient Medications  Medication Sig Dispense Refill   dapagliflozin propanediol (FARXIGA) 10 MG TABS tablet Take 1 tablet (10 mg total) by mouth daily before breakfast. (Patient not taking: Reported on 12/13/2023) 90 tablet 3   amLODipine (NORVASC) 5 MG tablet Take 1 tablet (5 mg total) by mouth daily. 30 tablet 0   apixaban (ELIQUIS) 5 MG TABS tablet Take 1 tablet (5 mg total) by mouth 2 (two) times daily. 180 tablet 1   atorvastatin (LIPITOR) 40 MG tablet TAKE ONE TABLET BY MOUTH ONCE DAILY (Patient taking differently: Take 40 mg by mouth at bedtime.) 90 tablet 1   B Complex-C (B-COMPLEX WITH VITAMIN C) tablet Take 1 tablet by mouth daily.     Cholecalciferol (VITAMIN D-3 PO) Take 1 tablet by mouth daily.     empagliflozin (JARDIANCE) 10 MG TABS tablet Take 1 tablet (10 mg total) by mouth daily before breakfast. (Patient not taking: Reported on 12/13/2023) 90 tablet 1   EPINEPHrine 0.3 mg/0.3 mL IJ SOAJ injection Inject 0.3 mg into the muscle as needed for anaphylaxis. 1 each 0   glipiZIDE (GLUCOTROL XL) 2.5 MG 24 hr tablet Take 2.5 mg by mouth daily with breakfast.     glucose blood test strip 1 each by Other route daily. Use daily for glucose control - Dx 250.00 100 each 0   metFORMIN (GLUCOPHAGE-XR) 500 MG 24 hr tablet Take 4 tablets (2,000 mg total) by mouth daily with supper. (Patient taking differently: Take 1,000 mg by mouth daily with supper.) 360 tablet 3   metoprolol succinate (TOPROL-XL) 25  MG 24 hr tablet Take 1 tablet (25 mg total) by mouth daily. 90 tablet 2   oxyCODONE (OXY IR/ROXICODONE) 5 MG immediate release tablet Take 1 tablet (5 mg total) by mouth every 4 (four) hours as needed for breakthrough pain (post-operatively). 15 tablet 0   propranolol (INDERAL) 10 MG tablet TAKE 1 TABLET BY MOUTH 4 TIMES DAILY AS NEEDED FOR PALPITATIONS, IRREGULAR OR FAST HEART RATE 270 tablet 1   Semaglutide, 1 MG/DOSE, (OZEMPIC, 1 MG/DOSE,) 4 MG/3ML SOPN INJECT 1 MG UNDER THE SKIN ONCE A WEEK AS DIRECTED 9 mL 3   tadalafil (CIALIS) 5 MG tablet Take 1 tablet (5 mg total) by mouth daily. (Patient taking differently: Take 5 mg by mouth daily as needed for erectile dysfunction.) 30 tablet 11   traMADol (ULTRAM) 50 MG tablet Take 1 tablet (50 mg total) by mouth every 6 (six) hours as needed for severe pain. 60 tablet 2   No current facility-administered medications for this visit.    PHYSICAL EXAMINATION: ECOG PERFORMANCE STATUS: 1 - Symptomatic but completely ambulatory  Vitals:   01/25/24 1338  BP: (!) 135/55  Pulse: 70  Resp: 17  Temp: 98.8 F (37.1 C)  SpO2: 100%  Wt Readings from Last 3 Encounters:  01/25/24 206 lb 8 oz (93.7 kg)  12/08/23 197 lb 11.2 oz (89.7 kg)  10/09/23 209 lb (94.8 kg)     GENERAL:alert, no distress and comfortable SKIN: skin color, texture, turgor are normal, no rashes or significant lesions EYES: normal, Conjunctiva are pink and non-injected, sclera clear NECK: supple, thyroid normal size, non-tender, without nodularity LYMPH:  no palpable lymphadenopathy in the cervical, axillary  LUNGS: clear to auscultation and percussion with normal breathing effort HEART: regular rate & rhythm and no murmurs and no lower extremity edema ABDOMEN:abdomen soft, non-tender and normal bowel sounds Musculoskeletal:no cyanosis of digits and no clubbing  NEURO: alert & oriented x 3 with fluent speech, no focal motor/sensory deficits       LABORATORY DATA:  I have  reviewed the data as listed    Latest Ref Rng & Units 01/25/2024    1:23 PM 12/12/2023    3:17 AM 12/11/2023    3:18 AM  CBC  WBC 4.0 - 10.5 K/uL 7.5     Hemoglobin 13.0 - 17.0 g/dL 46.9  62.9  52.8   Hematocrit 39.0 - 52.0 % 32.2  30.5  33.1   Platelets 150 - 400 K/uL 175           Latest Ref Rng & Units 01/25/2024    1:23 PM 12/12/2023    3:17 AM 12/11/2023    3:18 AM  CMP  Glucose 70 - 99 mg/dL 413  244  010   BUN 8 - 23 mg/dL 30  19  14    Creatinine 0.61 - 1.24 mg/dL 2.72  5.36  6.44   Sodium 135 - 145 mmol/L 140  137  136   Potassium 3.5 - 5.1 mmol/L 4.2  3.6  3.5   Chloride 98 - 111 mmol/L 107  106  106   CO2 22 - 32 mmol/L 27  22  19    Calcium 8.9 - 10.3 mg/dL 9.3  8.1  8.4   Total Protein 6.5 - 8.1 g/dL 6.8     Total Bilirubin 0.0 - 1.2 mg/dL 0.5     Alkaline Phos 38 - 126 U/L 70     AST 15 - 41 U/L 11     ALT 0 - 44 U/L 9         RADIOGRAPHIC STUDIES: I have personally reviewed the radiological images as listed and agreed with the findings in the report. No results found.    Orders Placed This Encounter  Procedures   Consent Attestation for Oncology Treatment    The patient is informed of risks, benefits, side-effects of the prescribed oncology treatment. Potential short term and long term side effects and response rates discussed. After a long discussion, the patient made informed decision to proceed.:   Yes   CBC with Differential (Cancer Center Only)    Standing Status:   Future    Expected Date:   02/01/2024    Expiration Date:   01/31/2025   CMP (Cancer Center only)    Standing Status:   Future    Expected Date:   02/01/2024    Expiration Date:   01/31/2025   T4    Standing Status:   Future    Expected Date:   02/01/2024    Expiration Date:   01/31/2025   TSH    Standing Status:   Future    Expected Date:   02/01/2024    Expiration Date:   01/31/2025  CBC with Differential (Cancer Center Only)    Standing Status:   Future    Expected Date:   02/29/2024     Expiration Date:   02/28/2025   CMP (Cancer Center only)    Standing Status:   Future    Expected Date:   02/29/2024    Expiration Date:   02/28/2025   CBC with Differential (Cancer Center Only)    Standing Status:   Future    Expected Date:   03/28/2024    Expiration Date:   03/28/2025   CMP (Cancer Center only)    Standing Status:   Future    Expected Date:   03/28/2024    Expiration Date:   03/28/2025   T4    Standing Status:   Future    Expected Date:   03/28/2024    Expiration Date:   03/28/2025   TSH    Standing Status:   Future    Expected Date:   03/28/2024    Expiration Date:   03/28/2025   PHYSICIAN COMMUNICATION ORDER    Thyroid function tests at baseline and every 3rd cycle.   All questions were answered. The patient knows to call the clinic with any problems, questions or concerns. No barriers to learning was detected. The total time spent in the appointment was 40 minutes.     Malachy Mood, MD 01/25/2024

## 2024-01-25 NOTE — Assessment & Plan Note (Signed)
 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 -He is clinically doing well, asymptomatic.  Surgical resection was discussed with patient, but he would like to try nonsurgical options. -He started [facility] on 01/12/23, stopped in June 2024 due to disease progression. 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 -I recommended neoadjuvant chemotherapy with cisplatin and gemcitabine for 4 cycles, followed by total cystectomy and prostatectomy. -He started chemotherapy on June 08, 2023, and completed on 08/17/2023, overall tolerated well  -He underwent right robotic right nephroureterectomy, robotic radical cystectomy with total urethrectomy, bilateral pelvic lymph node dissection, and left cutaneous ureterostomy by Dr. Berneice Heinrich on 12/08/2023.. surgical path showed multifocal high-grade urothelial carcinoma, right renal pelvis, ureter, bladder, and prostatic urethra, surgical margines negative, 1 positive node

## 2024-01-25 NOTE — Progress Notes (Signed)
 DISCONTINUE ON PATHWAY REGIMEN - Bladder     A cycle is every 21 days:     Gemcitabine      Cisplatin   **Always confirm dose/schedule in your pharmacy ordering system**  PRIOR TREATMENT: [id number]: Gemcitabine 1,000 mg/m2 D1, 8 + Cisplatin 70 mg/m2 D1 q21 Days x 4 Cycles  START ON PATHWAY REGIMEN - Bladder     A cycle is every 14 days:     Nivolumab   **Always confirm dose/schedule in your pharmacy ordering system**  Patient Characteristics: Post-Cystectomy with Neoadjuvant Therapy, M0 (Neoadjuvant Pathologic Staging), ypT2-T4b or ypN+, M0 Therapeutic Status: Post-Cystectomy with Neoadjuvant Therapy, M0 (Neoadjuvant Pathologic Staging) AJCC N Category: ypN1 AJCC M Category: cM0 AJCC 8 Stage Grouping: IIIA AJCC T Category: ypT1 Intent of Therapy: Curative Intent, Discussed with Patient

## 2024-01-26 ENCOUNTER — Encounter: Payer: Self-pay | Admitting: Hematology

## 2024-01-30 ENCOUNTER — Other Ambulatory Visit: Payer: Self-pay

## 2024-01-30 ENCOUNTER — Encounter (HOSPITAL_COMMUNITY): Payer: Self-pay

## 2024-01-30 ENCOUNTER — Emergency Department (HOSPITAL_COMMUNITY)
Admission: EM | Admit: 2024-01-30 | Discharge: 2024-01-30 | Disposition: A | Discharge status: Home / Self Care | Attending: Emergency Medicine | Admitting: Emergency Medicine

## 2024-01-30 ENCOUNTER — Emergency Department (HOSPITAL_COMMUNITY)

## 2024-01-30 DIAGNOSIS — R319 Hematuria, unspecified: Secondary | ICD-10-CM | POA: Diagnosis not present

## 2024-01-30 DIAGNOSIS — E119 Type 2 diabetes mellitus without complications: Secondary | ICD-10-CM | POA: Insufficient documentation

## 2024-01-30 DIAGNOSIS — N390 Urinary tract infection, site not specified: Secondary | ICD-10-CM | POA: Diagnosis not present

## 2024-01-30 DIAGNOSIS — R6883 Chills (without fever): Secondary | ICD-10-CM

## 2024-01-30 DIAGNOSIS — Z8551 Personal history of malignant neoplasm of bladder: Secondary | ICD-10-CM | POA: Insufficient documentation

## 2024-01-30 DIAGNOSIS — Z79899 Other long term (current) drug therapy: Secondary | ICD-10-CM | POA: Insufficient documentation

## 2024-01-30 DIAGNOSIS — Z794 Long term (current) use of insulin: Secondary | ICD-10-CM | POA: Diagnosis not present

## 2024-01-30 DIAGNOSIS — I10 Essential (primary) hypertension: Secondary | ICD-10-CM | POA: Diagnosis not present

## 2024-01-30 DIAGNOSIS — Z85828 Personal history of other malignant neoplasm of skin: Secondary | ICD-10-CM | POA: Diagnosis not present

## 2024-01-30 DIAGNOSIS — D649 Anemia, unspecified: Secondary | ICD-10-CM | POA: Diagnosis not present

## 2024-01-30 DIAGNOSIS — Z7984 Long term (current) use of oral hypoglycemic drugs: Secondary | ICD-10-CM | POA: Diagnosis not present

## 2024-01-30 DIAGNOSIS — Z452 Encounter for adjustment and management of vascular access device: Secondary | ICD-10-CM | POA: Diagnosis not present

## 2024-01-30 DIAGNOSIS — R6889 Other general symptoms and signs: Secondary | ICD-10-CM | POA: Diagnosis not present

## 2024-01-30 DIAGNOSIS — Z7901 Long term (current) use of anticoagulants: Secondary | ICD-10-CM | POA: Insufficient documentation

## 2024-01-30 LAB — RESP PANEL BY RT-PCR (RSV, FLU A&B, COVID)  RVPGX2
Influenza A by PCR: NEGATIVE
Influenza B by PCR: NEGATIVE
Resp Syncytial Virus by PCR: NEGATIVE
SARS Coronavirus 2 by RT PCR: NEGATIVE

## 2024-01-30 LAB — CBC WITH DIFFERENTIAL/PLATELET
Abs Immature Granulocytes: 0.07 10*3/uL (ref 0.00–0.07)
Basophils Absolute: 0 10*3/uL (ref 0.0–0.1)
Basophils Relative: 0 %
Eosinophils Absolute: 0 10*3/uL (ref 0.0–0.5)
Eosinophils Relative: 0 %
HCT: 30 % — ABNORMAL LOW (ref 39.0–52.0)
Hemoglobin: 10.1 g/dL — ABNORMAL LOW (ref 13.0–17.0)
Immature Granulocytes: 1 %
Lymphocytes Relative: 9 %
Lymphs Abs: 0.8 10*3/uL (ref 0.7–4.0)
MCH: 29.4 pg (ref 26.0–34.0)
MCHC: 33.7 g/dL (ref 30.0–36.0)
MCV: 87.5 fL (ref 80.0–100.0)
Monocytes Absolute: 0.4 10*3/uL (ref 0.1–1.0)
Monocytes Relative: 4 %
Neutro Abs: 7.9 10*3/uL — ABNORMAL HIGH (ref 1.7–7.7)
Neutrophils Relative %: 86 %
Platelets: 134 10*3/uL — ABNORMAL LOW (ref 150–400)
RBC: 3.43 MIL/uL — ABNORMAL LOW (ref 4.22–5.81)
RDW: 13.5 % (ref 11.5–15.5)
WBC: 9.2 10*3/uL (ref 4.0–10.5)
nRBC: 0 % (ref 0.0–0.2)

## 2024-01-30 LAB — URINALYSIS, W/ REFLEX TO CULTURE (INFECTION SUSPECTED)
Bilirubin Urine: NEGATIVE
Glucose, UA: NEGATIVE mg/dL
Ketones, ur: NEGATIVE mg/dL
Nitrite: NEGATIVE
Protein, ur: 30 mg/dL — AB
Specific Gravity, Urine: 1.009 (ref 1.005–1.030)
WBC, UA: >50 WBC/hpf (ref 0–5)
pH: 5 (ref 5.0–8.0)

## 2024-01-30 LAB — COMPREHENSIVE METABOLIC PANEL WITH GFR
ALT: 11 U/L (ref 0–44)
AST: 13 U/L — ABNORMAL LOW (ref 15–41)
Albumin: 3.7 g/dL (ref 3.5–5.0)
Alkaline Phosphatase: 63 U/L (ref 38–126)
Anion gap: 11 (ref 5–15)
BUN: 36 mg/dL — ABNORMAL HIGH (ref 8–23)
CO2: 20 mmol/L — ABNORMAL LOW (ref 22–32)
Calcium: 8.9 mg/dL (ref 8.9–10.3)
Chloride: 102 mmol/L (ref 98–111)
Creatinine, Ser: 1.79 mg/dL — ABNORMAL HIGH (ref 0.61–1.24)
GFR, Estimated: 40 mL/min — ABNORMAL LOW (ref >60)
Glucose, Bld: 157 mg/dL — ABNORMAL HIGH (ref 70–99)
Potassium: 3.8 mmol/L (ref 3.5–5.1)
Sodium: 133 mmol/L — ABNORMAL LOW (ref 135–145)
Total Bilirubin: 0.8 mg/dL (ref 0.0–1.2)
Total Protein: 6.6 g/dL (ref 6.5–8.1)

## 2024-01-30 LAB — I-STAT CG4 LACTIC ACID, ED
Lactic Acid, Venous: 1.2 mmol/L (ref 0.5–1.9)
Lactic Acid, Venous: 1.2 mmol/L (ref 0.5–1.9)

## 2024-01-30 LAB — CBG MONITORING, ED: Glucose-Capillary: 139 mg/dL — ABNORMAL HIGH (ref 70–99)

## 2024-01-30 MED ORDER — CEPHALEXIN 500 MG PO CAPS: 500.0000 mg | ORAL_CAPSULE | Freq: Two times a day (BID) | ORAL | 0 refills | Status: DC

## 2024-01-30 MED ORDER — CEPHALEXIN 500 MG PO CAPS: 500.0000 mg | ORAL_CAPSULE | Freq: Once | ORAL | Status: DC

## 2024-01-30 MED ORDER — LACTATED RINGERS IV BOLUS
1000.0000 mL | Freq: Once | INTRAVENOUS | Status: AC
  Administered 2024-01-30: 1000 mL via INTRAVENOUS

## 2024-01-30 NOTE — ED Triage Notes (Signed)
 Pt arrived reporting he had an episode earlier where he was shaking uncontrollaby. Denies fever, chills or any of symptoms. States felt off and thought it was his blood sugar. BG within normal range

## 2024-01-30 NOTE — Discharge Instructions (Addendum)
 Your urine showed evidence of infection and given your chills we will cover with a short course of antibiotics.  Your kidney function remained mildly elevated today with an elevated creatinine but was similar to measurements 5 days ago.  We have rehydrated you with fluids.  You are overall well-appearing and vitally stable.  Your COVID, flu, RSV PCR testing was negative and your chest x-ray was clear.  Please follow-up with your primary care physician, return for any severe worsening symptoms, your blood culture results will be available over the next few days.  If your blood culture results positive you may be called back to the emergency department

## 2024-01-30 NOTE — ED Provider Notes (Signed)
 [name] EMERGENCY DEPARTMENT AT [facility] Provider Note   CSN: [id number] Arrival date & time: 01/30/24  1723     History  Chief Complaint  Patient presents with   Shaking    Robert [name] is a 73 y.o. male.  HPI   73 year old male with medical history significant for hypertension, hyperlipidemia, DM 2 on insulin, paroxysmal atrial fibrillation on Eliquis, basal cell carcinoma, bladder cancer status post right robotic right nephroureterectomy, robotic radical cystectomy with total urethrectomy, bilateral pelvic lymph node dissection, and left cutaneous ureterostomy by Dr. Berneice Heinrich on 12/08/2023 who presents to the Emergency Department with chills and rigors.  The patient states that he was at his dentist office earlier today and was experiencing chills while at the dentist office.  He underwent routine cleaning during that time.  On the way home his chills worsened and he began to experience uncontrollable rigors.  Symptoms have since resolved some.  He denies any fevers.  He denies any new abdominal pain, cough, shortness of breath, chest pain.  No known sick contacts.  Home Medications Prior to Admission medications   Medication Sig Start Date End Date Taking? Authorizing Provider  cephALEXin (KEFLEX) 500 MG capsule Take 1 capsule (500 mg total) by mouth 2 (two) times daily for 5 days. 01/30/24 02/04/24 Yes Ernie Avena, MD  dapagliflozin propanediol (FARXIGA) 10 MG TABS tablet Take 1 tablet (10 mg total) by mouth daily before breakfast. Patient not taking: Reported on 12/13/2023 10/16/23   Carlus Pavlov, MD  amLODipine (NORVASC) 5 MG tablet Take 1 tablet (5 mg total) by mouth daily. 09/13/23   Jacalyn Lefevre, MD  apixaban (ELIQUIS) 5 MG TABS tablet Take 1 tablet (5 mg total) by mouth 2 (two) times daily. 12/21/23   Nahser, Deloris Ping, MD  atorvastatin (LIPITOR) 40 MG tablet TAKE ONE TABLET BY MOUTH ONCE DAILY Patient taking differently: Take 40 mg by mouth at  bedtime. 03/06/23   Carlus Pavlov, MD  B Complex-C (B-COMPLEX WITH VITAMIN C) tablet Take 1 tablet by mouth daily.    [provider]  Cholecalciferol (VITAMIN D-3 PO) Take 1 tablet by mouth daily.    [provider]  empagliflozin (JARDIANCE) 10 MG TABS tablet Take 1 tablet (10 mg total) by mouth daily before breakfast. Patient not taking: Reported on 12/13/2023 11/13/23   Carlus Pavlov, MD  EPINEPHrine 0.3 mg/0.3 mL IJ SOAJ injection Inject 0.3 mg into the muscle as needed for anaphylaxis. 09/14/23   Netta Corrigan, PA-C  glipiZIDE (GLUCOTROL XL) 2.5 MG 24 hr tablet Take 2.5 mg by mouth daily with breakfast.    [provider]  glucose blood test strip 1 each by Other route daily. Use daily for glucose control - Dx 250.00 04/21/20   [name], [name], MD  metFORMIN (GLUCOPHAGE-XR) 500 MG 24 hr tablet Take 4 tablets (2,000 mg total) by mouth daily with supper. Patient taking differently: Take 1,000 mg by mouth daily with supper. 02/03/23   Carlus Pavlov, MD  metoprolol succinate (TOPROL-XL) 25 MG 24 hr tablet Take 1 tablet (25 mg total) by mouth daily. 09/19/23   Nahser, Deloris Ping, MD  oxyCODONE (OXY IR/ROXICODONE) 5 MG immediate release tablet Take 1 tablet (5 mg total) by mouth every 4 (four) hours as needed for breakthrough pain (post-operatively). 12/12/23   Loletta Parish., MD  propranolol (INDERAL) 10 MG tablet TAKE 1 TABLET BY MOUTH 4 TIMES DAILY AS NEEDED FOR PALPITATIONS, IRREGULAR OR FAST HEART RATE 10/25/22  Nahser, Deloris Ping, MD  Semaglutide, 1 MG/DOSE, (OZEMPIC, 1 MG/DOSE,) 4 MG/3ML SOPN INJECT 1 MG UNDER THE SKIN ONCE A WEEK AS DIRECTED 07/20/23   Carlus Pavlov, MD  tadalafil (CIALIS) 5 MG tablet Take 1 tablet (5 mg total) by mouth daily. Patient taking differently: Take 5 mg by mouth daily as needed for erectile dysfunction. 07/03/23   Plotnikov, [name], MD  traMADol (ULTRAM) 50 MG tablet Take 1 tablet (50 mg total) by mouth every 6  (six) hours as needed for severe pain. 07/03/23   Plotnikov, [name], MD      Allergies    Lisinopril    Review of Systems   Review of Systems  All other systems reviewed and are negative.   Physical Exam Updated Vital Signs BP 132/69 (BP Location: Left Arm)   Pulse 93   Temp 98.7 F (37.1 C) (Oral)   Resp 16   Ht 6\' 2"  (1.88 m)   Wt 93 kg   SpO2 97%   BMI 26.32 kg/m  Physical Exam Vitals and nursing note reviewed.  Constitutional:      General: He is not in acute distress.    Appearance: He is well-developed.  HENT:     Head: Normocephalic and atraumatic.  Eyes:     Conjunctiva/sclera: Conjunctivae normal.  Cardiovascular:     Rate and Rhythm: Normal rate and regular rhythm.  Pulmonary:     Effort: Pulmonary effort is normal. No respiratory distress.     Breath sounds: Normal breath sounds.  Abdominal:     Palpations: Abdomen is soft.     Tenderness: There is no abdominal tenderness.     Comments: Left cutaneous ureterostomy bag in place with clear yellow urine.  No surrounding abdominal tenderness  Musculoskeletal:        General: No swelling.     Cervical back: Neck supple.  Skin:    General: Skin is warm and dry.     Capillary Refill: Capillary refill takes less than 2 seconds.  Neurological:     Mental Status: He is alert.  Psychiatric:        Mood and Affect: Mood normal.     ED Results / Procedures / Treatments   Labs (all labs ordered are listed, but only abnormal results are displayed) Labs Reviewed  COMPREHENSIVE METABOLIC PANEL WITH GFR - Abnormal; Notable for the following components:      Result Value   Sodium 133 (*)    CO2 20 (*)    Glucose, Bld 157 (*)    BUN 36 (*)    Creatinine, Ser 1.79 (*)    AST 13 (*)    GFR, Estimated 40 (*)    All other components within normal limits  CBC WITH DIFFERENTIAL/PLATELET - Abnormal; Notable for the following components:   RBC 3.43 (*)    Hemoglobin 10.1 (*)    HCT 30.0 (*)    Platelets 134 (*)     Neutro Abs 7.9 (*)    All other components within normal limits  URINALYSIS, W/ REFLEX TO CULTURE (INFECTION SUSPECTED) - Abnormal; Notable for the following components:   APPearance HAZY (*)    Hgb urine dipstick MODERATE (*)    Protein, ur 30 (*)    Leukocytes,Ua LARGE (*)    Bacteria, UA MANY (*)    All other components within normal limits  CBG MONITORING, ED - Abnormal; Notable for the following components:   Glucose-Capillary 139 (*)    All other components  within normal limits  CULTURE, BLOOD (ROUTINE X 2)  RESP PANEL BY RT-PCR (RSV, FLU A&B, COVID)  RVPGX2  CULTURE, BLOOD (ROUTINE X 2) W REFLEX TO ID PANEL  URINE CULTURE  I-STAT CG4 LACTIC ACID, ED  I-STAT CG4 LACTIC ACID, ED    EKG None  Radiology   Procedures Procedures    Medications Ordered in ED Medications  lactated ringers bolus 1,000 mL (0 mLs Intravenous Stopped 01/30/24 2058)    ED Course/ Medical Decision Making/ A&P                                 Medical Decision Making Amount and/or Complexity of Data Reviewed Labs: ordered. Radiology: ordered.  Risk Prescription drug management.     73 year old male with medical history significant for hypertension, hyperlipidemia, DM 2 on insulin, paroxysmal atrial fibrillation on Eliquis, basal cell carcinoma, bladder cancer status post right robotic right nephroureterectomy, robotic radical cystectomy with total urethrectomy, bilateral pelvic lymph node dissection, and left cutaneous ureterostomy by Dr. Berneice Heinrich on 12/08/2023 who presents to the Emergency Department with chills and rigors.  The patient states that he was at his dentist office earlier today and was experiencing chills while at the dentist office.  He underwent routine cleaning during that time.  On the way home his chills worsened and he began to experience uncontrollable rigors.  Symptoms have since resolved some.  He denies any fevers.  He denies any new abdominal pain, cough, shortness of  breath, chest pain.  No known sick contacts.  On arrival, the patient was afebrile oral temperature, 98.3, tachycardic heart rate 109, BP 166/86, respiratory rate 18, saturating 99% on room air.  CBG on arrival 139.  Given the patient's chills and rigors and extensive medical history, consider transient bacteremia, other infectious etiology such as UTI, pyelonephritis, viral infection, PNA, less likely intraabdominal infection.  Will plan to obtain CXR and screening labs for sepsis. Administer IVF for tachycardia.   On repeat evaluation, the patient remained afebrile, hemodynamically stable, heart rate improved to normal sinus rhythm following fluid resuscitation.  Patient is overall well-appearing and asymptomatic at this time.  Creatinine was mildly elevated but similar to prior measurements 5 days ago at 1.79.  CBC without a leukocytosis, mild anemia that is stable at 10.1, lactic acid normal.  Urinalysis was obtained and revealed moderate hemoglobin in the urine, large leukocytes, greater than 50 WBCs and many bacteria present.  In the setting of this, will prescribe a short course of antibiotics and have him follow-up with his PCP, return precautions provided.  Patient is overall well-appearing, stable for discharge, blood cultures and urine culture pending.  Patient provided with return precautions event of any severe worsening symptoms, overall well-appearing and stable for discharge at this time.   Final Clinical Impression(s) / ED Diagnoses Final diagnoses:  Urinary tract infection with hematuria, site unspecified    Rx / DC Orders ED Discharge Orders          Ordered    cephALEXin (KEFLEX) 500 MG capsule  2 times daily        01/30/24 2157              Ernie Avena, MD 01/30/24 2159

## 2024-01-31 ENCOUNTER — Encounter: Payer: Self-pay | Admitting: Hematology

## 2024-01-31 ENCOUNTER — Telehealth: Payer: Self-pay

## 2024-01-31 ENCOUNTER — Other Ambulatory Visit: Payer: Self-pay | Admitting: Hematology

## 2024-01-31 NOTE — Transitions of Care (Post Inpatient/ED Visit) (Signed)
 01/31/2024  Name: Robert [name] MRN: [id number] DOB: 01-24-1951  Today's TOC FU Call Status: Today's TOC FU Call Status:: Successful TOC FU Call Completed TOC FU Call Complete Date: 01/31/24 Patient's Name and Date of Birth confirmed.  Transition Care Management Follow-up Telephone Call Date of Discharge: 01/30/24 Discharge Facility: [facility]) Type of Discharge: Emergency Department Reason for ED Visit: Other: (UTI with hematuria) How have you been since you were released from the hospital?: Better Any questions or concerns?: No  Items Reviewed: Did you receive and understand the discharge instructions provided?: Yes Medications obtained,verified, and reconciled?: Yes (Medications Reviewed) Any new allergies since your discharge?: No Dietary orders reviewed?: NA Do you have support at home?: Yes  Medications Reviewed Today: Medications Reviewed Today     Reviewed by Anthoney Harada, LPN (Licensed Practical Nurse) on 01/31/24 at 1536  Med List Status: <None>   Medication Order Taking? Sig Documenting Provider Last Dose Status Informant  amLODipine (NORVASC) 5 MG tablet [id number] Yes Take 1 tablet (5 mg total) by mouth daily. Jacalyn Lefevre, MD Taking Active Self           Med Note Robert [name]   Tue Oct 03, 2023  3:22 PM)    apixaban (ELIQUIS) 5 MG TABS tablet [id number] Yes Take 1 tablet (5 mg total) by mouth 2 (two) times daily. Nahser, Deloris Ping, MD Taking Active   atorvastatin (LIPITOR) 40 MG tablet [id number] Yes TAKE ONE TABLET BY MOUTH ONCE DAILY  Patient taking differently: Take 40 mg by mouth at bedtime.   Carlus Pavlov, MD Taking Active Self  B Complex-C (B-COMPLEX WITH VITAMIN C) tablet [id number] No Take 1 tablet by mouth daily.  Patient not taking: Reported on 01/31/2024   [provider] Not Taking Active Self           Med Note Robert [name]   Mon Dec 04, 2023  1:27 PM) On hold for procedure   cephALEXin (KEFLEX) 500 MG capsule  [id number] Yes Take 1 capsule (500 mg total) by mouth 2 (two) times daily for 5 days. Ernie Avena, MD Taking Active   Cholecalciferol (VITAMIN [name]-3 PO) [id number] No Take 1 tablet by mouth daily.  Patient not taking: Reported on 01/31/2024   [provider] Not Taking Active Self           Med Note Robert [name]   Mon Dec 04, 2023  1:28 PM) On hold for procedure  dapagliflozin propanediol (FARXIGA) 10 MG TABS tablet [id number]  Take 1 tablet (10 mg total) by mouth daily before breakfast.  Patient not taking: Reported on 12/13/2023   Carlus Pavlov, MD  Active Self           Med Note Robert [name]   Wed Dec 13, 2023  1:42 PM) 12/13/23:  Reports during [facility] call-- has not started, either jardiance or farxiga. Pt hasn't chosen which one he plans on taking yet- based on cost: reports he is to discuss with his endocrinology provider at time of upcoming provider office visit   empagliflozin (JARDIANCE) 10 MG TABS tablet [id number]  Take 1 tablet (10 mg total) by mouth daily before breakfast.  Patient not taking: Reported on 12/13/2023   Carlus Pavlov, MD  Active Self           Med Note Robert [name]   Wed Dec 13, 2023  1:42 PM) 12/13/23:  Reports during [facility] call-- has not started, either jardiance or farxiga.  Pt hasn't chosen which one he plans on taking yet- based on cost: reports he is to discuss with his endocrinology provider at time of upcoming provider office visit   EPINEPHrine 0.3 mg/0.3 mL IJ SOAJ injection [id number] Yes Inject 0.3 mg into the muscle as needed for anaphylaxis. Netta Corrigan, PA-C Taking Active Self  glipiZIDE (GLUCOTROL XL) 2.5 MG 24 hr tablet [id number] Yes Take 2.5 mg by mouth daily with breakfast. [provider] Taking Active Self  glucose blood test strip [id number] Yes 1 each by Other route daily. Use daily for glucose control - Dx 250.00 [name], [name], MD Taking Active Self  metFORMIN (GLUCOPHAGE-XR) 500 MG 24 hr tablet [id number]  Yes Take 4 tablets (2,000 mg total) by mouth daily with supper.  Patient taking differently: Take 1,000 mg by mouth daily with supper.   Carlus Pavlov, MD Taking Active Self  metoprolol succinate (TOPROL-XL) 25 MG 24 hr tablet [id number] Yes Take 1 tablet (25 mg total) by mouth daily. Nahser, Deloris Ping, MD Taking Active Self  oxyCODONE (OXY IR/ROXICODONE) 5 MG immediate release tablet [id number]  Take 1 tablet (5 mg total) by mouth every 4 (four) hours as needed for breakthrough pain (post-operatively). Loletta Parish., MD  Active            Med Note Robert [name] Dec 13, 2023  1:44 PM) 12/13/23:  Reports during [facility] call-- has not started/ picked up from his outpatient pharmacy- reports ready for pick up-- but he may not pick up-- reports tramadol is controlling his pain   propranolol (INDERAL) 10 MG tablet [id number] Yes TAKE 1 TABLET BY MOUTH 4 TIMES DAILY AS NEEDED FOR PALPITATIONS, IRREGULAR OR FAST HEART RATE Nahser, Deloris Ping, MD Taking Active Self  Semaglutide, 1 MG/DOSE, (OZEMPIC, 1 MG/DOSE,) 4 MG/3ML SOPN [id number] Yes INJECT 1 MG UNDER THE SKIN ONCE [name] WEEK AS DIRECTED Carlus Pavlov, MD Taking Active Self           Med Note Robert [name]   Mon Dec 04, 2023  1:20 PM) Thursdays  tadalafil (CIALIS) 5 MG tablet [id number]  Take 1 tablet (5 mg total) by mouth daily.  Patient taking differently: Take 5 mg by mouth daily as needed for erectile dysfunction.   Plotnikov, [name], MD  Active Self  traMADol (ULTRAM) 50 MG tablet [id number] Yes Take 1 tablet (50 mg total) by mouth every 6 (six) hours as needed for severe pain. Plotnikov, [name], MD Taking Active Self            Home Care and Equipment/Supplies: Were Home Health Services Ordered?: NA Any new equipment or medical supplies ordered?: NA  Functional Questionnaire: Do you need assistance with bathing/showering or dressing?: No Do you need assistance with meal preparation?: No Do you need assistance with  eating?: No Do you have difficulty maintaining continence: No Do you need assistance with getting out of bed/getting out of [name] chair/moving?: No Do you have difficulty managing or taking your medications?: No  Follow up appointments reviewed: PCP Follow-up appointment confirmed?: Yes Date of PCP follow-up appointment?: 03/05/24 (patient scheduled on mychart and wanted to leave the same) Follow-up Provider: [facility] Follow-up appointment confirmed?: NA Do you need transportation to your follow-up appointment?: No Do you understand care options if your condition(s) worsen?: Yes-patient verbalized understanding    SIGNATURE Kandis Fantasia, LPN [facility] Health Advisor [facility] You Are. We Are. One  [facility] Direct Dial [phone]

## 2024-02-01 ENCOUNTER — Ambulatory Visit

## 2024-02-01 ENCOUNTER — Telehealth: Payer: Self-pay | Admitting: Hematology

## 2024-02-01 LAB — BLOOD CULTURE ID PANEL (REFLEXED) - BCID2

## 2024-02-01 NOTE — ED Notes (Signed)
 Dr. Theresia Lo notified of positive BC, pt will need to be called to return to ED for further evaluation.

## 2024-02-01 NOTE — ED Notes (Signed)
 Pt has been notified to return to the ED due to abnormal Blood cultures. This is requested by Dr Theresia Lo. +BC on of 2 bottles- 0 resistance - serratia marcescens noted.

## 2024-02-01 NOTE — ED Notes (Addendum)
 02/01/24 0806 Pt has been notified of abnormal blood cultures and the importance of returning to the ED for treatment. He is in agreement and will return. 02/01/24  1910 Pended note to chart at 0806 this am and pt has not returned then dismissed and note attached to this visit

## 2024-02-01 NOTE — ED Notes (Signed)
  Pt has been notified to return to the ED due to abnormal Blood cultures. This is requested by Dr Theresia Lo. +BC on of 2 bottles- 0 resistance - serratia marcescens noted. Pt verbalized he would return.

## 2024-02-02 LAB — URINE CULTURE: Culture: >=100000 — AB

## 2024-02-03 ENCOUNTER — Telehealth (HOSPITAL_COMMUNITY): Payer: Self-pay | Admitting: Emergency Medicine

## 2024-02-03 ENCOUNTER — Telehealth (HOSPITAL_BASED_OUTPATIENT_CLINIC_OR_DEPARTMENT_OTHER): Payer: Self-pay

## 2024-02-03 LAB — CULTURE, BLOOD (ROUTINE X 2)

## 2024-02-03 NOTE — Telephone Encounter (Cosign Needed)
 Contacted patient in regards to his positive blood cultures.  Patient stated that he had to make arrangements for someone to come and watch his wife, but he would get here as soon as he can.  Patient is aware that he will be admitted for IV antibiotics.

## 2024-02-03 NOTE — Progress Notes (Signed)
 ED Antimicrobial Stewardship Positive Culture Follow Up   Robert [name] is an 73 y.o. male who presented to [facility] on 01/30/2024 with a chief complaint of  Chief Complaint  Patient presents with   Shaking    Recent Results (from the past 720 hours)  Resp panel by RT-PCR (RSV, Flu A&B, Covid) Anterior Nasal Swab     Status: None   Collection Time: 01/30/24  6:42 PM   Specimen: Anterior Nasal Swab  Result Value Ref Range Status   SARS Coronavirus 2 by RT PCR NEGATIVE NEGATIVE Final    Comment: (NOTE) SARS-CoV-2 target nucleic acids are NOT DETECTED.  The SARS-CoV-2 RNA is generally detectable in upper respiratory specimens during the acute phase of infection. The lowest concentration of SARS-CoV-2 viral copies this assay can detect is 138 copies/mL. A negative result does not preclude SARS-Cov-2 infection and should not be used as the sole basis for treatment or other patient management decisions. A negative result may occur with  improper specimen collection/handling, submission of specimen other than nasopharyngeal swab, presence of viral mutation(s) within the areas targeted by this assay, and inadequate number of viral copies(<138 copies/mL). A negative result must be combined with clinical observations, patient history, and epidemiological information. The expected result is Negative.  Fact Sheet for Patients:  BloggerCourse.com  Fact Sheet for Healthcare Providers:  SeriousBroker.it  This test is no t yet approved or cleared by the [place]  FDA and  has been authorized for detection and/or diagnosis of SARS-CoV-2 by FDA under an Emergency Use Authorization (EUA). This EUA will remain  in effect (meaning this test can be used) for the duration of the COVID-19 declaration under Section 564(b)(1) of the Act, 21 U.S.C.section 360bbb-3(b)(1), unless the authorization is terminated  or revoked sooner.        Influenza A by PCR NEGATIVE NEGATIVE Final   Influenza B by PCR NEGATIVE NEGATIVE Final    Comment: (NOTE) The Xpert Xpress SARS-CoV-2/FLU/RSV plus assay is intended as an aid in the diagnosis of influenza from Nasopharyngeal swab specimens and should not be used as a sole basis for treatment. Nasal washings and aspirates are unacceptable for Xpert Xpress SARS-CoV-2/FLU/RSV testing.  Fact Sheet for Patients: BloggerCourse.com  Fact Sheet for Healthcare Providers: SeriousBroker.it  This test is not yet approved or cleared by the [place]  FDA and has been authorized for detection and/or diagnosis of SARS-CoV-2 by FDA under an Emergency Use Authorization (EUA). This EUA will remain in effect (meaning this test can be used) for the duration of the COVID-19 declaration under Section 564(b)(1) of the Act, 21 U.S.C. section 360bbb-3(b)(1), unless the authorization is terminated or revoked.     Resp Syncytial Virus by PCR NEGATIVE NEGATIVE Final    Comment: (NOTE) Fact Sheet for Patients: BloggerCourse.com  Fact Sheet for Healthcare Providers: SeriousBroker.it  This test is not yet approved or cleared by the [place]  FDA and has been authorized for detection and/or diagnosis of SARS-CoV-2 by FDA under an Emergency Use Authorization (EUA). This EUA will remain in effect (meaning this test can be used) for the duration of the COVID-19 declaration under Section 564(b)(1) of the Act, 21 U.S.C. section 360bbb-3(b)(1), unless the authorization is terminated or revoked.  Performed at [facility], [address] [place], [place] [address]   Blood Culture (routine x 2)     Status: Abnormal   Collection Time: 01/30/24  7:08 PM   Specimen: BLOOD LEFT ARM  Result Value Ref  Range Status   Specimen Description   Final    BLOOD LEFT ARM Performed at [facility], [address], [place], [place] [address]    Special Requests   Final    BOTTLES DRAWN AEROBIC AND ANAEROBIC Blood Culture results may not be optimal due to an inadequate volume of blood received in culture bottles Performed at [facility], [address], [place], [place] [address]    Culture  Setup Time   Final    GRAM NEGATIVE RODS AEROBIC BOTTLE ONLY CRITICAL RESULT CALLED TO, READ BACK BY AND VERIFIED WITH: Corita Diego  02/01/24 MK Performed at [facility], [address], [place], [place] [address]    Culture SERRATIA MARCESCENS (A)  Final   Report Status 02/03/2024 FINAL  Final   Organism ID, Bacteria SERRATIA MARCESCENS  Final      Susceptibility   Serratia marcescens - MIC*    CEFEPIME 0.5 SENSITIVE Sensitive     CEFTAZIDIME <=1 SENSITIVE Sensitive     CEFTRIAXONE <=0.25 SENSITIVE Sensitive     CIPROFLOXACIN <=0.25 SENSITIVE Sensitive     GENTAMICIN <=1 SENSITIVE Sensitive     TRIMETH/SULFA <=20 SENSITIVE Sensitive     * SERRATIA MARCESCENS  Blood Culture ID Panel (Reflexed)     Status: Abnormal   Collection Time: 01/30/24  7:08 PM  Result Value Ref Range Status   Enterococcus faecalis NOT DETECTED NOT DETECTED Final   Enterococcus Faecium NOT DETECTED NOT DETECTED Final   Listeria monocytogenes NOT DETECTED NOT DETECTED Final   Staphylococcus species NOT DETECTED NOT DETECTED Final   Staphylococcus aureus (BCID) NOT DETECTED NOT DETECTED Final   Staphylococcus epidermidis NOT DETECTED NOT DETECTED Final   Staphylococcus lugdunensis NOT DETECTED NOT DETECTED Final   Streptococcus species NOT DETECTED NOT DETECTED Final   Streptococcus agalactiae NOT DETECTED NOT DETECTED Final   Streptococcus pneumoniae NOT DETECTED NOT DETECTED Final   Streptococcus pyogenes NOT DETECTED NOT DETECTED Final   A.calcoaceticus-baumannii NOT DETECTED NOT DETECTED Final   Bacteroides fragilis NOT DETECTED NOT DETECTED Final   Enterobacterales DETECTED  (A) NOT DETECTED Final    Comment: Enterobacterales represent a large order of gram negative bacteria, not a single organism. CRITICAL RESULT CALLED TO, READ BACK BY AND VERIFIED WITH: M FOX,RN@[id number]  02/01/24 MK    Enterobacter cloacae complex NOT DETECTED NOT DETECTED Final   Escherichia coli NOT DETECTED NOT DETECTED Final   Klebsiella aerogenes NOT DETECTED NOT DETECTED Final   Klebsiella oxytoca NOT DETECTED NOT DETECTED Final   Klebsiella pneumoniae NOT DETECTED NOT DETECTED Final   Proteus species NOT DETECTED NOT DETECTED Final   Salmonella species NOT DETECTED NOT DETECTED Final   Serratia marcescens DETECTED (A) NOT DETECTED Final    Comment: CRITICAL RESULT CALLED TO, READ BACK BY AND VERIFIED WITH: M FOX,RN@[id number]  02/01/24 MK    Haemophilus influenzae NOT DETECTED NOT DETECTED Final   Neisseria meningitidis NOT DETECTED NOT DETECTED Final   Pseudomonas aeruginosa NOT DETECTED NOT DETECTED Final   Stenotrophomonas maltophilia NOT DETECTED NOT DETECTED Final   Candida albicans NOT DETECTED NOT DETECTED Final   Candida auris NOT DETECTED NOT DETECTED Final   Candida glabrata NOT DETECTED NOT DETECTED Final   Candida krusei NOT DETECTED NOT DETECTED Final   Candida parapsilosis NOT DETECTED NOT DETECTED Final   Candida tropicalis NOT DETECTED NOT DETECTED Final   Cryptococcus neoformans/gattii NOT DETECTED NOT DETECTED Final   CTX-M ESBL NOT DETECTED NOT DETECTED Final  Carbapenem resistance IMP NOT DETECTED NOT DETECTED Final   Carbapenem resistance KPC NOT DETECTED NOT DETECTED Final   Carbapenem resistance NDM NOT DETECTED NOT DETECTED Final   Carbapenem resist OXA 48 LIKE NOT DETECTED NOT DETECTED Final   Carbapenem resistance VIM NOT DETECTED NOT DETECTED Final    Comment: Performed at [facility], [address] [place], [place] [address]  Urine Culture     Status: Abnormal   Collection Time: 01/30/24  7:54 PM   Specimen: Urine, Random  Result Value Ref Range  Status   Specimen Description   Final    URINE, RANDOM Performed at [facility], [address], [place], [place] [address]    Special Requests   Final    NONE Reflexed from [id number] Performed at [facility], [address] [place], [place] [address]    Culture (A)  Final    >=100,000 COLONIES/mL SERRATIA MARCESCENS >=100,000 COLONIES/mL ENTEROCOCCUS FAECALIS    Report Status 02/02/2024 FINAL  Final   Organism ID, Bacteria SERRATIA MARCESCENS (A)  Final   Organism ID, Bacteria ENTEROCOCCUS FAECALIS (A)  Final      Susceptibility   Enterococcus faecalis - MIC*    AMPICILLIN <=2 SENSITIVE Sensitive     NITROFURANTOIN <=16 SENSITIVE Sensitive     VANCOMYCIN 2 SENSITIVE Sensitive     * >=100,000 COLONIES/mL ENTEROCOCCUS FAECALIS   Serratia marcescens - MIC*    CEFEPIME 0.5 SENSITIVE Sensitive     CEFTRIAXONE 8 RESISTANT Resistant     CIPROFLOXACIN <=0.25 SENSITIVE Sensitive     GENTAMICIN <=1 SENSITIVE Sensitive     NITROFURANTOIN 256 RESISTANT Resistant     TRIMETH/SULFA <=20 SENSITIVE Sensitive     * >=100,000 COLONIES/mL SERRATIA MARCESCENS    [x]  Treated with cephalexin, organism resistant to prescribed antimicrobial []  Patient discharged originally without antimicrobial agent and treatment is now indicated  Patient previously called on 4/10 to return to ED for positive blood cultures, however, I do not see any new encounters or treatment plan.  Today:  Patient called again on 4/12 by ED PA to return to ED for positive blood and urine cultures, not treated by current antibiotic.  Patient acknowledges and agrees to return to ED after finding care for his wife.    ED Provider: Judie Noun PA  Kendall Pauls PharmD, BCPS [facility] main pharmacy [phone] 02/03/2024 11:30 AM

## 2024-02-04 ENCOUNTER — Inpatient Hospital Stay (HOSPITAL_COMMUNITY)
Admission: EM | Admit: 2024-02-04 | Discharge: 2024-02-07 | DRG: 660 | Disposition: A | Discharge status: Home / Self Care | Attending: Internal Medicine | Admitting: Internal Medicine

## 2024-02-04 ENCOUNTER — Telehealth (HOSPITAL_BASED_OUTPATIENT_CLINIC_OR_DEPARTMENT_OTHER): Payer: Self-pay

## 2024-02-04 ENCOUNTER — Other Ambulatory Visit: Payer: Self-pay

## 2024-02-04 ENCOUNTER — Emergency Department (HOSPITAL_COMMUNITY)

## 2024-02-04 ENCOUNTER — Encounter (HOSPITAL_COMMUNITY): Payer: Self-pay

## 2024-02-04 ENCOUNTER — Telehealth (HOSPITAL_COMMUNITY): Payer: Self-pay | Admitting: Emergency Medicine

## 2024-02-04 DIAGNOSIS — N1330 Unspecified hydronephrosis: Secondary | ICD-10-CM | POA: Diagnosis not present

## 2024-02-04 DIAGNOSIS — C679 Malignant neoplasm of bladder, unspecified: Secondary | ICD-10-CM | POA: Diagnosis present

## 2024-02-04 DIAGNOSIS — Z8546 Personal history of malignant neoplasm of prostate: Secondary | ICD-10-CM

## 2024-02-04 DIAGNOSIS — K5730 Diverticulosis of large intestine without perforation or abscess without bleeding: Secondary | ICD-10-CM | POA: Diagnosis not present

## 2024-02-04 DIAGNOSIS — R8271 Bacteriuria: Secondary | ICD-10-CM | POA: Diagnosis not present

## 2024-02-04 DIAGNOSIS — N179 Acute kidney failure, unspecified: Secondary | ICD-10-CM | POA: Diagnosis present

## 2024-02-04 DIAGNOSIS — Z905 Acquired absence of kidney: Secondary | ICD-10-CM | POA: Diagnosis not present

## 2024-02-04 DIAGNOSIS — Z7901 Long term (current) use of anticoagulants: Secondary | ICD-10-CM

## 2024-02-04 DIAGNOSIS — Z79899 Other long term (current) drug therapy: Secondary | ICD-10-CM

## 2024-02-04 DIAGNOSIS — Z1152 Encounter for screening for COVID-19: Secondary | ICD-10-CM | POA: Diagnosis not present

## 2024-02-04 DIAGNOSIS — R7881 Bacteremia: Principal | ICD-10-CM | POA: Diagnosis present

## 2024-02-04 DIAGNOSIS — B952 Enterococcus as the cause of diseases classified elsewhere: Secondary | ICD-10-CM | POA: Diagnosis present

## 2024-02-04 DIAGNOSIS — Z85828 Personal history of other malignant neoplasm of skin: Secondary | ICD-10-CM

## 2024-02-04 DIAGNOSIS — I10 Essential (primary) hypertension: Secondary | ICD-10-CM | POA: Diagnosis present

## 2024-02-04 DIAGNOSIS — G8929 Other chronic pain: Secondary | ICD-10-CM | POA: Diagnosis present

## 2024-02-04 DIAGNOSIS — Z9221 Personal history of antineoplastic chemotherapy: Secondary | ICD-10-CM | POA: Diagnosis not present

## 2024-02-04 DIAGNOSIS — Z7984 Long term (current) use of oral hypoglycemic drugs: Secondary | ICD-10-CM | POA: Diagnosis not present

## 2024-02-04 DIAGNOSIS — Z888 Allergy status to other drugs, medicaments and biological substances status: Secondary | ICD-10-CM | POA: Diagnosis not present

## 2024-02-04 DIAGNOSIS — E785 Hyperlipidemia, unspecified: Secondary | ICD-10-CM | POA: Diagnosis present

## 2024-02-04 DIAGNOSIS — Z8616 Personal history of COVID-19: Secondary | ICD-10-CM | POA: Diagnosis not present

## 2024-02-04 DIAGNOSIS — Z906 Acquired absence of other parts of urinary tract: Secondary | ICD-10-CM | POA: Diagnosis not present

## 2024-02-04 DIAGNOSIS — I480 Paroxysmal atrial fibrillation: Secondary | ICD-10-CM | POA: Diagnosis not present

## 2024-02-04 DIAGNOSIS — N136 Pyonephrosis: Principal | ICD-10-CM | POA: Diagnosis present

## 2024-02-04 DIAGNOSIS — R109 Unspecified abdominal pain: Secondary | ICD-10-CM | POA: Diagnosis not present

## 2024-02-04 DIAGNOSIS — Z860100 Personal history of colon polyps, unspecified: Secondary | ICD-10-CM

## 2024-02-04 DIAGNOSIS — E119 Type 2 diabetes mellitus without complications: Secondary | ICD-10-CM

## 2024-02-04 DIAGNOSIS — N390 Urinary tract infection, site not specified: Secondary | ICD-10-CM

## 2024-02-04 DIAGNOSIS — Z800 Family history of malignant neoplasm of digestive organs: Secondary | ICD-10-CM

## 2024-02-04 DIAGNOSIS — C675 Malignant neoplasm of bladder neck: Secondary | ICD-10-CM | POA: Diagnosis not present

## 2024-02-04 DIAGNOSIS — F17290 Nicotine dependence, other tobacco product, uncomplicated: Secondary | ICD-10-CM | POA: Diagnosis present

## 2024-02-04 DIAGNOSIS — Z8551 Personal history of malignant neoplasm of bladder: Secondary | ICD-10-CM | POA: Diagnosis not present

## 2024-02-04 DIAGNOSIS — R7989 Other specified abnormal findings of blood chemistry: Secondary | ICD-10-CM | POA: Diagnosis present

## 2024-02-04 DIAGNOSIS — Z794 Long term (current) use of insulin: Secondary | ICD-10-CM

## 2024-02-04 DIAGNOSIS — Z960 Presence of urogenital implants: Secondary | ICD-10-CM | POA: Diagnosis not present

## 2024-02-04 DIAGNOSIS — N134 Hydroureter: Secondary | ICD-10-CM | POA: Diagnosis not present

## 2024-02-04 DIAGNOSIS — Z9079 Acquired absence of other genital organ(s): Secondary | ICD-10-CM

## 2024-02-04 LAB — BASIC METABOLIC PANEL WITH GFR
Anion gap: 11 (ref 5–15)
BUN: 36 mg/dL — ABNORMAL HIGH (ref 8–23)
CO2: 21 mmol/L — ABNORMAL LOW (ref 22–32)
Calcium: 8.4 mg/dL — ABNORMAL LOW (ref 8.9–10.3)
Chloride: 104 mmol/L (ref 98–111)
Creatinine, Ser: 1.91 mg/dL — ABNORMAL HIGH (ref 0.61–1.24)
GFR, Estimated: 37 mL/min — ABNORMAL LOW (ref >60)
Glucose, Bld: 149 mg/dL — ABNORMAL HIGH (ref 70–99)
Potassium: 3.6 mmol/L (ref 3.5–5.1)
Sodium: 136 mmol/L (ref 135–145)

## 2024-02-04 LAB — CBC WITH DIFFERENTIAL/PLATELET
Abs Immature Granulocytes: 0.04 10*3/uL (ref 0.00–0.07)
Basophils Absolute: 0 10*3/uL (ref 0.0–0.1)
Basophils Relative: 0 %
Eosinophils Absolute: 0 10*3/uL (ref 0.0–0.5)
Eosinophils Relative: 1 %
HCT: 28.1 % — ABNORMAL LOW (ref 39.0–52.0)
Hemoglobin: 9.2 g/dL — ABNORMAL LOW (ref 13.0–17.0)
Immature Granulocytes: 1 %
Lymphocytes Relative: 30 %
Lymphs Abs: 1.7 10*3/uL (ref 0.7–4.0)
MCH: 29 pg (ref 26.0–34.0)
MCHC: 32.7 g/dL (ref 30.0–36.0)
MCV: 88.6 fL (ref 80.0–100.0)
Monocytes Absolute: 0.6 10*3/uL (ref 0.1–1.0)
Monocytes Relative: 11 %
Neutro Abs: 3.1 10*3/uL (ref 1.7–7.7)
Neutrophils Relative %: 57 %
Platelets: 159 10*3/uL (ref 150–400)
RBC: 3.17 MIL/uL — ABNORMAL LOW (ref 4.22–5.81)
RDW: 13.5 % (ref 11.5–15.5)
WBC: 5.5 10*3/uL (ref 4.0–10.5)
nRBC: 0 % (ref 0.0–0.2)

## 2024-02-04 MED ORDER — AMOXICILLIN 500 MG PO CAPS
500.0000 mg | ORAL_CAPSULE | Freq: Three times a day (TID) | ORAL | Status: DC
  Administered 2024-02-05 – 2024-02-07 (×8): 500 mg via ORAL
  Filled 2024-02-04 (×8): qty 1

## 2024-02-04 MED ORDER — SODIUM CHLORIDE 0.9 % IV SOLN
2.0000 g | Freq: Once | INTRAVENOUS | Status: AC
  Administered 2024-02-04: 2 g via INTRAVENOUS
  Filled 2024-02-04: qty 12.5

## 2024-02-04 MED ORDER — POLYETHYLENE GLYCOL 3350 17 G PO PACK: 17.0000 g | PACK | Freq: Every day | ORAL | Status: DC | PRN

## 2024-02-04 MED ORDER — ACETAMINOPHEN 500 MG PO TABS: 1000.0000 mg | ORAL_TABLET | Freq: Four times a day (QID) | ORAL | Status: DC | PRN

## 2024-02-04 MED ORDER — AMOXICILLIN 500 MG PO CAPS: 500.0000 mg | ORAL_CAPSULE | Freq: Two times a day (BID) | ORAL | Status: DC

## 2024-02-04 MED ORDER — METOPROLOL SUCCINATE ER 25 MG PO TB24
25.0000 mg | ORAL_TABLET | Freq: Every day | ORAL | Status: DC
  Administered 2024-02-05: 25 mg via ORAL
  Filled 2024-02-04 (×3): qty 1

## 2024-02-04 MED ORDER — INSULIN ASPART 100 UNIT/ML IJ SOLN
0.0000 [IU] | Freq: Three times a day (TID) | INTRAMUSCULAR | Status: DC
  Administered 2024-02-05: 3 [IU] via SUBCUTANEOUS
  Administered 2024-02-05: 2 [IU] via SUBCUTANEOUS
  Administered 2024-02-06: 1 [IU] via SUBCUTANEOUS
  Administered 2024-02-06: 2 [IU] via SUBCUTANEOUS
  Administered 2024-02-06: 1 [IU] via SUBCUTANEOUS
  Administered 2024-02-07: 3 [IU] via SUBCUTANEOUS
  Filled 2024-02-04: qty 0.06

## 2024-02-04 MED ORDER — PROPRANOLOL HCL 10 MG PO TABS: 10.0000 mg | ORAL_TABLET | Freq: Four times a day (QID) | ORAL | Status: DC | PRN

## 2024-02-04 MED ORDER — SODIUM CHLORIDE 0.9% FLUSH
3.0000 mL | Freq: Two times a day (BID) | INTRAVENOUS | Status: DC
  Administered 2024-02-04 – 2024-02-07 (×5): 3 mL via INTRAVENOUS

## 2024-02-04 MED ORDER — ONDANSETRON HCL 4 MG/2ML IJ SOLN: 4.0000 mg | Freq: Four times a day (QID) | INTRAMUSCULAR | Status: DC | PRN

## 2024-02-04 MED ORDER — AMLODIPINE BESYLATE 5 MG PO TABS
5.0000 mg | ORAL_TABLET | Freq: Every day | ORAL | Status: DC
  Administered 2024-02-05 – 2024-02-07 (×3): 5 mg via ORAL
  Filled 2024-02-04 (×3): qty 1

## 2024-02-04 MED ORDER — ATORVASTATIN CALCIUM 40 MG PO TABS
40.0000 mg | ORAL_TABLET | Freq: Every day | ORAL | Status: DC
  Administered 2024-02-04 – 2024-02-06 (×3): 40 mg via ORAL
  Filled 2024-02-04 (×3): qty 1

## 2024-02-04 MED ORDER — SODIUM CHLORIDE 0.9 % IV SOLN
2.0000 g | Freq: Two times a day (BID) | INTRAVENOUS | Status: DC
  Administered 2024-02-05 – 2024-02-07 (×5): 2 g via INTRAVENOUS
  Filled 2024-02-04 (×5): qty 12.5

## 2024-02-04 MED ORDER — TRAMADOL HCL 50 MG PO TABS: 50.0000 mg | ORAL_TABLET | Freq: Four times a day (QID) | ORAL | Status: DC | PRN

## 2024-02-04 MED ORDER — AMOXICILLIN 500 MG PO CAPS
500.0000 mg | ORAL_CAPSULE | Freq: Once | ORAL | Status: AC
  Administered 2024-02-04: 500 mg via ORAL
  Filled 2024-02-04: qty 1

## 2024-02-04 MED ORDER — APIXABAN 5 MG PO TABS
5.0000 mg | ORAL_TABLET | Freq: Two times a day (BID) | ORAL | Status: DC
  Administered 2024-02-04 – 2024-02-07 (×6): 5 mg via ORAL
  Filled 2024-02-04 (×6): qty 1

## 2024-02-04 MED ORDER — MELATONIN 3 MG PO TABS
6.0000 mg | ORAL_TABLET | Freq: Every evening | ORAL | Status: DC | PRN
  Administered 2024-02-05: 6 mg via ORAL
  Filled 2024-02-04: qty 2

## 2024-02-04 NOTE — H&P (Signed)
 History and Physical    Robert [name]:[id number] DOB: 01/16/1951 DOA: 02/04/2024  PCP: Genia Kettering, MD   Patient coming from: Home   Chief Complaint:  Chief Complaint  Patient presents with   abnormal labs    HPI:  Robert [name] is a 73 y.o. male with hx of multifocal high-grade urothelial carcinoma status postnephroureterectomy, cystoprostatectomy, urethrectomy, and left cutaneuous ureterostomy, followed by oncology and planning to start on immunotherapy; additional history of A-fib on [facility], diabetes, hypertension, who was recently seen in the ED on 4/8 for fever and rigors after dental appointment.  On that visit he had urine concerning for possible UTI and he was started on a course of Keflex.  Urine culture and blood cultures were obtained.  Today he was called back due to positive blood culture and urine culture. Has actually felt better in past few days with no fever, rigors in the interim.    Review of Systems:  ROS complete and negative except as marked above   Allergies  Allergen Reactions   Lisinopril Swelling    Prior to Admission medications   Medication Sig Start Date End Date Taking? Authorizing Provider  amLODipine (NORVASC) 5 MG tablet Take 1 tablet (5 mg total) by mouth daily. 09/13/23  Yes Sueellen Emery, MD  apixaban (ELIQUIS) 5 MG TABS tablet Take 1 tablet (5 mg total) by mouth 2 (two) times daily. 12/21/23  Yes Nahser, Lela Purple, MD  atorvastatin (LIPITOR) 40 MG tablet TAKE ONE TABLET BY MOUTH ONCE DAILY Patient taking differently: Take 40 mg by mouth at bedtime. 03/06/23  Yes Emilie Harden, MD  B Complex-C (B-COMPLEX WITH VITAMIN C) tablet Take 1 tablet by mouth daily.   Yes [provider]  Cholecalciferol (VITAMIN D-3 PO) Take 1 tablet by mouth daily.   Yes [provider]  dapagliflozin propanediol (FARXIGA) 10 MG TABS tablet Take 1 tablet (10 mg total) by mouth daily before breakfast. Patient not taking:  Reported on 12/13/2023 10/16/23   Emilie Harden, MD  EPINEPHrine 0.3 mg/0.3 mL IJ SOAJ injection Inject 0.3 mg into the muscle as needed for anaphylaxis. 09/14/23  Yes Schuman, Royston Cornea T, PA-C  glipiZIDE (GLUCOTROL XL) 2.5 MG 24 hr tablet Take 2.5 mg by mouth daily with breakfast.   Yes [provider]  metFORMIN (GLUCOPHAGE-XR) 500 MG 24 hr tablet Take 4 tablets (2,000 mg total) by mouth daily with supper. Patient taking differently: Take 1,000 mg by mouth daily with supper. 02/03/23  Yes Emilie Harden, MD  metoprolol succinate (TOPROL-XL) 25 MG 24 hr tablet Take 1 tablet (25 mg total) by mouth daily. 09/19/23  Yes Nahser, Lela Purple, MD  propranolol (INDERAL) 10 MG tablet TAKE 1 TABLET BY MOUTH 4 TIMES DAILY AS NEEDED FOR PALPITATIONS, IRREGULAR OR FAST HEART RATE 10/25/22  Yes Nahser, Lela Purple, MD  Semaglutide, 1 MG/DOSE, (OZEMPIC, 1 MG/DOSE,) 4 MG/3ML SOPN INJECT 1 MG UNDER THE SKIN ONCE A WEEK AS DIRECTED 07/20/23  Yes Emilie Harden, MD  tadalafil (CIALIS) 5 MG tablet Take 1 tablet (5 mg total) by mouth daily. Patient taking differently: Take 5 mg by mouth daily as needed for erectile dysfunction. 07/03/23  Yes Plotnikov, Aleksei V, MD  traMADol (ULTRAM) 50 MG tablet Take 1 tablet (50 mg total) by mouth every 6 (six) hours as needed for severe pain. 07/03/23  Yes Plotnikov, Aleksei V, MD  cephALEXin (KEFLEX) 500 MG capsule Take 1 capsule (500 mg total) by mouth 2 (two) times daily for  5 days. Patient not taking: Reported on 02/04/2024 01/30/24 02/04/24  Rosealee Concha, MD  empagliflozin (JARDIANCE) 10 MG TABS tablet Take 1 tablet (10 mg total) by mouth daily before breakfast. Patient not taking: Reported on 12/13/2023 11/13/23   Emilie Harden, MD  glucose blood test strip 1 each by Other route daily. Use daily for glucose control - Dx 250.00 04/21/20   [name], Estela Y, MD  mupirocin ointment (BACTROBAN) 2 % Apply 1 Application topically daily. Patient not taking: Reported on  02/04/2024 01/23/24   [provider]  oxyCODONE (OXY IR/ROXICODONE) 5 MG immediate release tablet Take 1 tablet (5 mg total) by mouth every 4 (four) hours as needed for breakthrough pain (post-operatively). Patient not taking: Reported on 02/04/2024 12/12/23   Melody Spurling., MD  tamsulosin (FLOMAX) 0.4 MG CAPS capsule Take 0.4 mg by mouth daily. Patient not taking: Reported on 02/04/2024 12/01/23   [provider]    Past Medical History:  Diagnosis Date   Anticoagulant long-term use    eliquis--- managed by cardiology   Arthritis    Bladder cancer [facility])    urologist--- dr Derrick Fling;  overlapping   History of basal cell carcinoma (BCC) excision    History of COVID-19 05/2019   per pt mild symptoms that resolved   HLD (hyperlipidemia)    HTN (hypertension)    Hx of colonic polyps    Insulin dependent type 2 diabetes mellitus [facility])    endocrinologist--- dr Aldona Amel --  (03-11-2022 per pt check blood sugar 1-2 times daily,  fasting sugar-- 118-120s)   Nocturia    Paroxysmal atrial fibrillation (HCC) 03/12/2019   cardiologist--- dr Floria Hurst   Ureterocele    Use of cane as ambulatory aid     Past Surgical History:  Procedure Laterality Date   BLEPHAROPLASTY Bilateral    per pt approx  2001;   upper eyelid's   COLONOSCOPY     last one approx 2023   CYSTOSCOPY W/ RETROGRADES N/A 12/08/2023   Procedure: CYSTOSCOPY;  Surgeon: Melody Spurling., MD;  Location: [facility];  Service: Urology;  Laterality: N/A;  390 MINUTES NEEDED FOR CASE   CYSTOSCOPY WITH URETEROSCOPY AND STENT PLACEMENT Right 12/02/2022   Procedure: CYSTOSCOPY WITH RIGHT URETEROSCOPY, RIGHT DIAGNOSTIC URETEROSCOPY;  Surgeon: Christina Coyer, MD;  Location: [facility];  Service: Urology;  Laterality: Right;  90 MINS FOR CASE   CYSTOSCOPY/RETROGRADE/URETEROSCOPY Bilateral 05/05/2023   Procedure: CYSTOSCOPY BILATERAL RETROGRADE PYELOGRAM RIGHT URETEROSCOPY RIGHT STENT PLACEMENT;  Surgeon: Christina Coyer, MD;   Location: [facility];  Service: Urology;  Laterality: Bilateral;  90 MINS FOR CASE   IR IMAGING GUIDED PORT INSERTION  05/30/2023   PATELLAR TENDON REPAIR Left 04/16/2004   @[facility]    ROBOT ASSITED LAPAROSCOPIC NEPHROURETERECTOMY Right 12/08/2023   Procedure: RIGHT ROBOT ASSISTED Nephroureterectomy;  Surgeon: Melody Spurling., MD;  Location: [facility];  Service: Urology;  Laterality: Right;   ROBOT LAP RADICAL CYSTOPROSTATECTOMY PELVIC LYMPHADENECTOMY, NEOBLADDER  12/08/2023   Procedure: ROBOT ASSISTED LAPAROSCOPIC RADICAL CYSTOPROSTATECTOMY;  Surgeon: Melody Spurling., MD;  Location: [facility];  Service: Urology;;   TOTAL URETHRECTOMY,RADICAL N/A 12/08/2023   Procedure: TOTAL URETHRECTOMY,RADICAL;  Surgeon: Melody Spurling., MD;  Location: [facility];  Service: Urology;  Laterality: N/A;   TRANSURETHRAL RESECTION OF BLADDER TUMOR N/A 06/18/2021   Procedure: TRANSURETHRAL RESECTION OF BLADDER TUMOR (TURBT) WITH RIGHT URETERAL STENT PLACEMENT, RIGHT URETEROSCOPY WITH DISTRACTION OF TUMOR, FULGURATION;  Surgeon: Christina Coyer, MD;  [facility];  Service: Urology;  Laterality: N/A;   TRANSURETHRAL RESECTION OF BLADDER TUMOR N/A 03/15/2022   Procedure: TRANSURETHRAL RESECTION OF BLADDER TUMOR (TURBT) WITH BILATERAL URETEROSCOPY/URETHRAL/ DILATION/BILATERAL RETROGRADE PYELOGRAM/BIOPSY AND FULGURATION OF RIGHT URETERAL CANCER/RIGHT STENT PLACEMENT;  Surgeon: Christina Coyer, MD;  Location: [facility];  Service: Urology;  Laterality: N/A;   TRANSURETHRAL RESECTION OF BLADDER TUMOR N/A 05/05/2023   Procedure: TRANSURETHRAL RESECTION OF BLADDER TUMOR (TURBT) BLADDER BIOSPY;  Surgeon: Christina Coyer, MD;  Location: [facility];  Service: Urology;  Laterality: N/A;   TRANSURETHRAL RESECTION OF BLADDER TUMOR WITH MITOMYCIN-C N/A 12/02/2022   Procedure: TRANSURETHRAL RESECTION OF BLADDER TUMOR;  Surgeon: Christina Coyer, MD;  Location:  [facility];  Service: Urology;  Laterality: N/A;   TRANSURETHRAL RESECTION OF PROSTATE  05/05/2023   Procedure: TRANSURETHRAL RESECTION OF THE PROSTATE (TURP);  Surgeon: Christina Coyer, MD;  Location: [facility];  Service: Urology;;     reports that he has been smoking cigars. He has never used smokeless tobacco. He reports current alcohol use. He reports that he does not use drugs.  Family History  Problem Relation Age of Onset   Pancreatic cancer Mother 42   Cancer Paternal Uncle        unknown type cancer   Aortic aneurysm Other        family history   Cancer Other        Breast (1st degree relative)   Colon cancer Neg Hx    Rectal cancer Neg Hx    Stomach cancer Neg Hx    Esophageal cancer Neg Hx      Physical Exam: Vitals:   02/04/24 1628 02/04/24 1740 02/04/24 1944 02/04/24 2135  BP: 134/78  137/70   Pulse: 75  69   Resp: 18  20   Temp:  97.9 F (36.6 C)  98.1 F (36.7 C)  TempSrc:  Oral  Oral  SpO2: 100%  100%     Gen: Awake, alert, NAD   CV: Regular, normal S1, S2, no murmurs  Resp: Normal WOB, CTAB  Abd: Flat, normoactive, nontender. Urostomy draining clear straw urine  MSK: Symmetric, no edema  Skin: No rashes or lesions to exposed skin  Neuro: Alert and interactive  Psych: euthymic, appropriate    Data review:   Labs reviewed, notable for:   Bicarb 21, no anion gap Creatinine 1.9, recent uptrend since February question baseline 1.4-1.6 Blood culture 4/8 positive Serratia marcescens Urine culture 4/8 positive Enterococcus faecalis and Serratia marcescens (CTX R)   Micro:  Results for orders placed or performed during the hospital encounter of 01/30/24  Resp panel by RT-PCR (RSV, Flu A&B, Covid) Anterior Nasal Swab     Status: None   Collection Time: 01/30/24  6:42 PM   Specimen: Anterior Nasal Swab  Result Value Ref Range Status   SARS Coronavirus 2 by RT PCR NEGATIVE NEGATIVE Final    Comment: (NOTE) SARS-CoV-2 target nucleic  acids are NOT DETECTED.  The SARS-CoV-2 RNA is generally detectable in upper respiratory specimens during the acute phase of infection. The lowest concentration of SARS-CoV-2 viral copies this assay can detect is 138 copies/mL. A negative result does not preclude SARS-Cov-2 infection and should not be used as the sole basis for treatment or other patient management decisions. A negative result may occur with  improper specimen collection/handling, submission of specimen other than nasopharyngeal swab, presence of viral mutation(s) within the areas targeted by this assay, and inadequate number of  viral copies(<138 copies/mL). A negative result must be combined with clinical observations, patient history, and epidemiological information. The expected result is Negative.  Fact Sheet for Patients:  BloggerCourse.com  Fact Sheet for Healthcare Providers:  SeriousBroker.it  This test is no t yet approved or cleared by the [place]  FDA and  has been authorized for detection and/or diagnosis of SARS-CoV-2 by FDA under an Emergency Use Authorization (EUA). This EUA will remain  in effect (meaning this test can be used) for the duration of the COVID-19 declaration under Section 564(b)(1) of the Act, 21 U.S.C.section 360bbb-3(b)(1), unless the authorization is terminated  or revoked sooner.       Influenza A by PCR NEGATIVE NEGATIVE Final   Influenza B by PCR NEGATIVE NEGATIVE Final    Comment: (NOTE) The Xpert Xpress SARS-CoV-2/FLU/RSV plus assay is intended as an aid in the diagnosis of influenza from Nasopharyngeal swab specimens and should not be used as a sole basis for treatment. Nasal washings and aspirates are unacceptable for Xpert Xpress SARS-CoV-2/FLU/RSV testing.  Fact Sheet for Patients: BloggerCourse.com  Fact Sheet for Healthcare Providers: SeriousBroker.it  This  test is not yet approved or cleared by the [place]  FDA and has been authorized for detection and/or diagnosis of SARS-CoV-2 by FDA under an Emergency Use Authorization (EUA). This EUA will remain in effect (meaning this test can be used) for the duration of the COVID-19 declaration under Section 564(b)(1) of the Act, 21 U.S.C. section 360bbb-3(b)(1), unless the authorization is terminated or revoked.     Resp Syncytial Virus by PCR NEGATIVE NEGATIVE Final    Comment: (NOTE) Fact Sheet for Patients: BloggerCourse.com  Fact Sheet for Healthcare Providers: SeriousBroker.it  This test is not yet approved or cleared by the [place]  FDA and has been authorized for detection and/or diagnosis of SARS-CoV-2 by FDA under an Emergency Use Authorization (EUA). This EUA will remain in effect (meaning this test can be used) for the duration of the COVID-19 declaration under Section 564(b)(1) of the Act, 21 U.S.C. section 360bbb-3(b)(1), unless the authorization is terminated or revoked.  Performed at [facility], [address] [place], [place] [address]   Blood Culture (routine x 2)     Status: Abnormal   Collection Time: 01/30/24  7:08 PM   Specimen: BLOOD LEFT ARM  Result Value Ref Range Status   Specimen Description   Final    BLOOD LEFT ARM Performed at [facility], [address] [place], [place] [address]    Special Requests   Final    BOTTLES DRAWN AEROBIC AND ANAEROBIC Blood Culture results may not be optimal due to an inadequate volume of blood received in culture bottles Performed at [facility], [address] [place], [place] [address]    Culture  Setup Time   Final    GRAM NEGATIVE RODS AEROBIC BOTTLE ONLY CRITICAL RESULT CALLED TO, READ BACK BY AND VERIFIED WITH: Amey Ka  02/01/24 MK Performed at [facility], [address], [place], [place]  [address]    Culture SERRATIA MARCESCENS (A)  Final   Report Status 02/03/2024 FINAL  Final   Organism ID, Bacteria SERRATIA MARCESCENS  Final      Susceptibility   Serratia marcescens - MIC*    CEFEPIME 0.5 SENSITIVE Sensitive     CEFTAZIDIME <=1 SENSITIVE Sensitive     CEFTRIAXONE <=0.25 SENSITIVE Sensitive     CIPROFLOXACIN <=0.25 SENSITIVE Sensitive     GENTAMICIN <=1 SENSITIVE  Sensitive     TRIMETH/SULFA <=20 SENSITIVE Sensitive     * SERRATIA MARCESCENS  Blood Culture ID Panel (Reflexed)     Status: Abnormal   Collection Time: 01/30/24  7:08 PM  Result Value Ref Range Status   Enterococcus faecalis NOT DETECTED NOT DETECTED Final   Enterococcus Faecium NOT DETECTED NOT DETECTED Final   Listeria monocytogenes NOT DETECTED NOT DETECTED Final   Staphylococcus species NOT DETECTED NOT DETECTED Final   Staphylococcus aureus (BCID) NOT DETECTED NOT DETECTED Final   Staphylococcus epidermidis NOT DETECTED NOT DETECTED Final   Staphylococcus lugdunensis NOT DETECTED NOT DETECTED Final   Streptococcus species NOT DETECTED NOT DETECTED Final   Streptococcus agalactiae NOT DETECTED NOT DETECTED Final   Streptococcus pneumoniae NOT DETECTED NOT DETECTED Final   Streptococcus pyogenes NOT DETECTED NOT DETECTED Final   A.calcoaceticus-baumannii NOT DETECTED NOT DETECTED Final   Bacteroides fragilis NOT DETECTED NOT DETECTED Final   Enterobacterales DETECTED (A) NOT DETECTED Final    Comment: Enterobacterales represent a large order of gram negative bacteria, not a single organism. CRITICAL RESULT CALLED TO, READ BACK BY AND VERIFIED WITH: M FOX,RN@[id number]  02/01/24 MK    Enterobacter cloacae complex NOT DETECTED NOT DETECTED Final   Escherichia coli NOT DETECTED NOT DETECTED Final   Klebsiella aerogenes NOT DETECTED NOT DETECTED Final   Klebsiella oxytoca NOT DETECTED NOT DETECTED Final   Klebsiella pneumoniae NOT DETECTED NOT DETECTED Final   Proteus species NOT DETECTED NOT DETECTED Final    Salmonella species NOT DETECTED NOT DETECTED Final   Serratia marcescens DETECTED (A) NOT DETECTED Final    Comment: CRITICAL RESULT CALLED TO, READ BACK BY AND VERIFIED WITH: M FOX,RN@[id number]  02/01/24 MK    Haemophilus influenzae NOT DETECTED NOT DETECTED Final   Neisseria meningitidis NOT DETECTED NOT DETECTED Final   Pseudomonas aeruginosa NOT DETECTED NOT DETECTED Final   Stenotrophomonas maltophilia NOT DETECTED NOT DETECTED Final   Candida albicans NOT DETECTED NOT DETECTED Final   Candida auris NOT DETECTED NOT DETECTED Final   Candida glabrata NOT DETECTED NOT DETECTED Final   Candida krusei NOT DETECTED NOT DETECTED Final   Candida parapsilosis NOT DETECTED NOT DETECTED Final   Candida tropicalis NOT DETECTED NOT DETECTED Final   Cryptococcus neoformans/gattii NOT DETECTED NOT DETECTED Final   CTX-M ESBL NOT DETECTED NOT DETECTED Final   Carbapenem resistance IMP NOT DETECTED NOT DETECTED Final   Carbapenem resistance KPC NOT DETECTED NOT DETECTED Final   Carbapenem resistance NDM NOT DETECTED NOT DETECTED Final   Carbapenem resist OXA 48 LIKE NOT DETECTED NOT DETECTED Final   Carbapenem resistance VIM NOT DETECTED NOT DETECTED Final    Comment: Performed at [facility], [address], [place], [place] [address]  Urine Culture     Status: Abnormal   Collection Time: 01/30/24  7:54 PM   Specimen: Urine, Random  Result Value Ref Range Status   Specimen Description   Final    URINE, RANDOM Performed at [facility], [address], [place], [place] [address]    Special Requests   Final    NONE Reflexed from [id number] Performed at [facility], [address] [place], [place] [address]    Culture (A)  Final    >=100,000 COLONIES/mL SERRATIA MARCESCENS >=100,000 COLONIES/mL ENTEROCOCCUS FAECALIS    Report Status 02/02/2024 FINAL  Final   Organism ID, Bacteria SERRATIA MARCESCENS (A)  Final   Organism ID, Bacteria ENTEROCOCCUS  FAECALIS (A)  Final  Susceptibility   Enterococcus faecalis - MIC*    AMPICILLIN <=2 SENSITIVE Sensitive     NITROFURANTOIN <=16 SENSITIVE Sensitive     VANCOMYCIN 2 SENSITIVE Sensitive     * >=100,000 COLONIES/mL ENTEROCOCCUS FAECALIS   Serratia marcescens - MIC*    CEFEPIME 0.5 SENSITIVE Sensitive     CEFTRIAXONE 8 RESISTANT Resistant     CIPROFLOXACIN <=0.25 SENSITIVE Sensitive     GENTAMICIN <=1 SENSITIVE Sensitive     NITROFURANTOIN 256 RESISTANT Resistant     TRIMETH/SULFA <=20 SENSITIVE Sensitive     * >=100,000 COLONIES/mL SERRATIA MARCESCENS    Imaging reviewed:  CT Renal Stone Study Result Date: 02/04/2024 CLINICAL DATA:  Left-sided flank pain for 1 week, initial encounter EXAM: CT ABDOMEN AND PELVIS WITHOUT CONTRAST TECHNIQUE: Multidetector CT imaging of the abdomen and pelvis was performed following the standard protocol without IV contrast. RADIATION DOSE REDUCTION: This exam was performed according to the departmental dose-optimization program which includes automated exposure control, adjustment of the mA and/or kV according to patient size and/or use of iterative reconstruction technique. COMPARISON:  09/01/2023 FINDINGS: Lower chest: No acute abnormality. Hepatobiliary: No focal liver abnormality is seen. No gallstones, gallbladder wall thickening, or biliary dilatation. Pancreas: Unremarkable. No pancreatic ductal dilatation or surrounding inflammatory changes. Spleen: Normal in size without focal abnormality. Adrenals/Urinary Tract: Adrenal glands are mildly hyperplastic bilaterally. 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 No obstructing stone is noted. These changes are new from the prior exam. Bladder has been surgically removed. Stomach/Bowel: Scattered diverticular change of the colon is noted. No evidence of  diverticulitis is seen. The appendix is within normal limits. Stomach and small bowel are unremarkable. Vascular/Lymphatic: Aortic atherosclerosis. No enlarged abdominal or pelvic lymph nodes. Reproductive: Prostate has been surgically removed. Other: No abdominal wall hernia or abnormality. No abdominopelvic ascites. Musculoskeletal: Degenerative changes of the lumbar spine and right hip are seen. IMPRESSION: Left-sided hydronephrosis without focal calculus. This extends all the way to the level of the urostomy. Diverticulosis without diverticulitis. Postoperative changes consistent with cystoprostatectomy. Electronically Signed   By: Violeta Grey M.D.   On: 02/04/2024 19:23     ED Course:  Started on cefepime for Serratia bacteremia and amoxicillin for Enterococcus UTI.   Assessment/Plan:  73 y.o. male with hx of multifocal high-grade urothelial carcinoma status postnephroureterectomy, cystoprostatectomy, urethrectomy, and left cutaneuous ureterostomy, followed by oncology and planning to start on immunotherapy; additional history of A-fib on AC, diabetes, hypertension, who was recently seen in the ED on 4/8 for fever and rigors after dental appointment.  Callback to the ED today due to positive blood culture with Serratia marcescens and positive urine culture with Serratia marcescens and Enterococcus faecalis.  Serratia marcescens bacteremia Polymicrobial UTI with Serratia marcescens and Enterococcus faecalis - Routine infectious disease consult in the morning, not contacted overnight - draw surveillance cultures - Continue cefepime 2 g IV every 12 hours for Serratia bacteremia; appears to have uncomplicated bacteremia with FQ sensitive isolate, may be able to do Fluoroquinolone oral therapy  - Continue amoxicillin 500 mg twice daily for Enterococcus UTI  Left-sided hydronephrosis Worsening creatinine trend since his nephrectomy in February, baseline creatinine appears to be 1.4-1.6.  Elevated  to 1.9 without AKI this admission.  Imaging with CT notable for left-sided hydronephrosis to the ureterostomy. -EDP is consulted with urology who will consult in the morning -Avoid renal toxic  meds, trend renal function  Chronic medical problems: Multifocal high-grade urothelial carcinoma: status postnephroureterectomy, cystoprostatectomy, urethrectomy, and left cutaneuous ureterostomy, followed by oncology and planning to start on immunotherapy; will need to be delayed pending completed treatment of his bacteremia and urinary infection. Prostate CA: incidental dx on surgical tx above.  A-fib: Continue apixaban, metoprolol. Diabetes type 2:   Home regimen includes glipizide, metformin, Ozempic,?  Jardiance.. Would discontinue his SGLT2 inhibitor (per Pharm med rec has already stopped) on discharge.  SSI for very sensitive while inpatient. Hypertension: Continue home amlodipine. Hyperlipidemia: Continues home atorvastatin Chronic pain: Continue home tramadol.   There is no height or weight on file to calculate BMI.    DVT prophylaxis:  Eliquis Code Status:  Full Code Diet:  Diet Orders (From admission, onward)     Start     Ordered   02/04/24 2332  Diet regular Room service appropriate? Yes; Fluid consistency: Thin  Diet effective now       Question Answer Comment  Room service appropriate? Yes   Fluid consistency: Thin      02/04/24 2332           Family Communication:  None   Consults:  Urology   Admission status:   Inpatient, Telemetry bed  Severity of Illness: The appropriate patient status for this patient is INPATIENT. Inpatient status is judged to be reasonable and necessary in order to provide the required intensity of service to ensure the patient's safety. The patient's presenting symptoms, physical exam findings, and initial radiographic and laboratory data in the context of their chronic comorbidities is felt to place them at high risk for further clinical  deterioration. Furthermore, it is not anticipated that the patient will be medically stable for discharge from the hospital within 2 midnights of admission.   * I certify that at the point of admission it is my clinical judgment that the patient will require inpatient hospital care spanning beyond 2 midnights from the point of admission due to high intensity of service, high risk for further deterioration and high frequency of surveillance required.*   Arnulfo Larch, MD [facility]  How to contact the [facility] Attending or Consulting provider 7A - 7P or covering provider during after hours 7P -7A, for this patient.  Check the care team in [facility] and look for a) attending/consulting [facility] provider listed and b) the [facility] team listed Log into [email or web address] and use [facility] universal password to access. If you do not have the password, please contact the hospital operator. Locate the [facility] provider you are looking for under [facility] and page to a number that you can be directly reached. If you still have difficulty reaching the provider, please page the [facility] (Director on Call) for the Hospitalists listed on amion for assistance.  02/04/2024, 11:36 PM

## 2024-02-04 NOTE — Telephone Encounter (Addendum)
 Post ED Visit - Positive Culture Follow-up  Culture report reviewed by antimicrobial stewardship pharmacist: [facility] []  Court Distance, Pharm.D. []  Skeet Duke, Pharm.D., BCPS AQ-ID []  Leslee Rase, Pharm.D., BCPS []  Garland Junk, [address].D., BCPS []  [place], [address].D., BCPS, AAHIVP []  Alcide Aly, Pharm.D., BCPS, AAHIVP []  Jerri Morale, PharmD, BCPS []  Graham Laws, PharmD, BCPS []  Cleda Curly, PharmD, BCPS []  Tamar Fairly, PharmD []  Ballard Levels, PharmD, BCPS []  Ollen Beverage, PharmD  [facility] []  Arlyne Bering, PharmD []  Sherryle Don, PharmD []  Van Gelinas, PharmD []  Delila Felty, Rph []  [place]) Cleora Daft, PharmD []  Augustina Block, PharmD []  Arie Kurtz, PharmD []  Sharlyn Deaner, PharmD []  Agnes Hose, PharmD [x]  Kendall Pauls, PharmD []  Gladstone Lamer, PharmD []  Armanda Bern, PharmD []  Tera Fellows, PharmD   Positive blood culture  Patient called to return to ED on 02/01/24 and 02/03/2024. Pt called again on 02/04/24 and told to return, states he will be here around 1 pm.    No further patient follow-up is required at this time.  Delena Feil 02/04/2024, 12:47 PM

## 2024-02-04 NOTE — ED Triage Notes (Signed)
 Pt states that he is here for abt therapy for some abnormal labs per his provider.

## 2024-02-04 NOTE — Telephone Encounter (Cosign Needed)
 Called patient to see if he will come in today to receive IV antibiotics for positive blood and urine cultures. He states that he will come in about an hour.

## 2024-02-04 NOTE — Consult Note (Signed)
 Reason for Consult: Bladder Cancer, Mild Hydronephrosis of Solitary Kidney, Bacteremia + Bacteruria  Referring Physician: Florentino Hurdle DO  Robert [name] is an 73 y.o. male.   HPI:   1 - Metastatic Multifocal Urothelial Carcinoma - s/p cystoprostatectomy + Rt nephroureterectomy + total urethrectomy + ICG sentinal / templante node dissection and left cutaneous ureterostomy 11/2023 for Stage pT4N1Mx urothelial cancer involvind bladder and Rt ureter, 1 Rt sentinal obturator node positive. Neo-adjuvant gem-cis prior with Dr. Maryalice Smaller. ??Post-OP Course: ?- none yet ?  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 No bowel anastamoses.   ??4 - Bacteruria + Bacteremia - expected bacteruria after urinary diversion. CX 12/2023 serratia sens bactrim, gent, levaquin. ??New serratia bactermia on eval malaise and chills 01/2024 from ER anc called back. Placed on cefipime + amox per pharmacy.  5 - Mild Left Hydronephrosis / Solitary Kidney - some mild but new left hydro on Er CT 01/2024 on eval malaise and bacteremia. Making good urine Cr 1.9 up slightly form baselin of abotu 1.5.    PMH sig for AFib/Eliquus (follows [facility] [facility] cards), IDDM2 (A1c 6s, ozempic), Hip OA (eventual arhtroplasty after cancer treatments). HIs PCP is Anitra Barn MD. His youngest son died at age 73 from MM. 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 Wife with dementia and he has some hoem care to help. Son in [place] with grand-daughter expected fall 2024. Retired from [employer]. ?  ?Today "Robert [name]" is seen in consultaation for above. Admitted for some bacteremia but not sepsis. No fevers / tachycardia / hypotension. He does have some new mild left hydro and slight Cr bump.   Past Medical  History:  Diagnosis Date   Anticoagulant long-term use    eliquis--- managed by cardiology   Arthritis    Bladder cancer [facility])    urologist--- dr Derrick Fling;  overlapping   History of basal cell carcinoma (BCC) excision    History of COVID-19 05/2019   per pt mild symptoms that resolved   HLD (hyperlipidemia)    HTN (hypertension)    Hx of colonic polyps    Insulin dependent type 2 diabetes mellitus [facility])    endocrinologist--- dr Aldona Amel --  (03-11-2022 per pt check blood sugar 1-2 times daily,  fasting sugar-- 118-120s)   Nocturia    Paroxysmal atrial fibrillation (HCC) 03/12/2019   cardiologist--- dr Floria Hurst   Ureterocele    Use of cane as ambulatory aid     Past Surgical History:  Procedure Laterality Date   BLEPHAROPLASTY Bilateral    per pt approx  2001;   upper eyelid's   COLONOSCOPY     last one approx 2023   CYSTOSCOPY W/ RETROGRADES N/A 12/08/2023   Procedure: CYSTOSCOPY;  Surgeon: Melody Spurling., MD;  Location: [facility];  Service: Urology;  Laterality: N/A;  390 MINUTES NEEDED FOR CASE   CYSTOSCOPY WITH URETEROSCOPY AND STENT PLACEMENT Right 12/02/2022   Procedure: CYSTOSCOPY WITH RIGHT URETEROSCOPY, RIGHT DIAGNOSTIC URETEROSCOPY;  Surgeon: Christina Coyer, MD;  Location: [facility];  Service: Urology;  Laterality: Right;  90 MINS FOR CASE   CYSTOSCOPY/RETROGRADE/URETEROSCOPY Bilateral 05/05/2023   Procedure: CYSTOSCOPY BILATERAL RETROGRADE PYELOGRAM RIGHT URETEROSCOPY RIGHT STENT PLACEMENT;  Surgeon: Christina Coyer, MD;  [facility];  Service: Urology;  Laterality: Bilateral;  90 MINS FOR CASE   IR IMAGING GUIDED PORT INSERTION  05/30/2023   PATELLAR TENDON REPAIR Left 04/16/2004   @[facility]    ROBOT ASSITED LAPAROSCOPIC NEPHROURETERECTOMY Right 12/08/2023   Procedure: RIGHT ROBOT ASSISTED Nephroureterectomy;  Surgeon: Melody Spurling., MD;  Location: [facility];  Service: Urology;  Laterality: Right;   ROBOT LAP RADICAL CYSTOPROSTATECTOMY PELVIC  LYMPHADENECTOMY, NEOBLADDER  12/08/2023   Procedure: ROBOT ASSISTED LAPAROSCOPIC RADICAL CYSTOPROSTATECTOMY;  Surgeon: Melody Spurling., MD;  Location: [facility];  Service: Urology;;   TOTAL URETHRECTOMY,RADICAL N/A 12/08/2023   Procedure: TOTAL URETHRECTOMY,RADICAL;  Surgeon: Melody Spurling., MD;  Location: [facility];  Service: Urology;  Laterality: N/A;   TRANSURETHRAL RESECTION OF BLADDER TUMOR N/A 06/18/2021   Procedure: TRANSURETHRAL RESECTION OF BLADDER TUMOR (TURBT) WITH RIGHT URETERAL STENT PLACEMENT, RIGHT URETEROSCOPY WITH DISTRACTION OF TUMOR, FULGURATION;  Surgeon: Christina Coyer, MD;  Location: [facility];  Service: Urology;  Laterality: N/A;   TRANSURETHRAL RESECTION OF BLADDER TUMOR N/A 03/15/2022   Procedure: TRANSURETHRAL RESECTION OF BLADDER TUMOR (TURBT) WITH BILATERAL URETEROSCOPY/URETHRAL/ DILATION/BILATERAL RETROGRADE PYELOGRAM/BIOPSY AND FULGURATION OF RIGHT URETERAL CANCER/RIGHT STENT PLACEMENT;  Surgeon: Christina Coyer, MD;  Location: [facility];  Service: Urology;  Laterality: N/A;   TRANSURETHRAL RESECTION OF BLADDER TUMOR N/A 05/05/2023   Procedure: TRANSURETHRAL RESECTION OF BLADDER TUMOR (TURBT) BLADDER BIOSPY;  Surgeon: Christina Coyer, MD;  Location[facility];  Service: Urology;  Laterality: N/A;   TRANSURETHRAL RESECTION OF BLADDER TUMOR WITH MITOMYCIN-C N/A 12/02/2022   Procedure: TRANSURETHRAL RESECTION OF BLADDER TUMOR;  Surgeon: Christina Coyer, MD;  Location: [facility];  Service: Urology;  Laterality: N/A;   TRANSURETHRAL RESECTION OF PROSTATE  05/05/2023   Procedure: TRANSURETHRAL RESECTION OF THE PROSTATE (TURP);  Surgeon: Christina Coyer, MD;  Location: [facility];  Service: Urology;;    Family History  Problem Relation Age of Onset   Pancreatic cancer Mother 79   Cancer Paternal Uncle        unknown type cancer   Aortic aneurysm Other        family history   Cancer Other         Breast (1st degree relative)   Colon cancer Neg Hx    Rectal cancer Neg Hx    Stomach cancer Neg Hx    Esophageal cancer Neg Hx     Social History:  reports that he has been smoking cigars. He has never used smokeless tobacco. He reports current alcohol use. He reports that he does not use drugs.  Allergies:  Allergies  Allergen Reactions   Lisinopril Swelling    Medications: I have reviewed the patient's current medications.  Results for orders placed or performed during the hospital encounter of 02/04/24 (from the past 48 hours)  CBC with Differential     Status: Abnormal   Collection Time: 02/04/24  4:55 PM  Result Value Ref Range   WBC 5.5 4.0 - 10.5 K/uL   RBC 3.17 (L) 4.22 - 5.81 MIL/uL   Hemoglobin 9.2 (L) 13.0 - 17.0 g/dL   HCT 16.1 (L) 09.6 - 04.5 %   MCV 88.6 80.0 - 100.0 fL   MCH 29.0 26.0 - 34.0 pg   MCHC 32.7 30.0 - 36.0 g/dL   RDW 40.9 81.1 - 91.4 %   Platelets 159 150 - 400 K/uL   nRBC 0.0 0.0 - 0.2 %   Neutrophils Relative % 57 %   Neutro Abs 3.1 1.7 -  7.7 K/uL   Lymphocytes Relative 30 %   Lymphs Abs 1.7 0.7 - 4.0 K/uL   Monocytes Relative 11 %   Monocytes Absolute 0.6 0.1 - 1.0 K/uL   Eosinophils Relative 1 %   Eosinophils Absolute 0.0 0.0 - 0.5 K/uL   Basophils Relative 0 %   Basophils Absolute 0.0 0.0 - 0.1 K/uL   WBC Morphology HIDE    Immature Granulocytes 1 %   Abs Immature Granulocytes 0.04 0.00 - 0.07 K/uL    Comment: Performed at [facility], [address], [place], [place] [address]  Basic metabolic panel     Status: Abnormal   Collection Time: 02/04/24  4:55 PM  Result Value Ref Range   Sodium 136 135 - 145 mmol/L   Potassium 3.6 3.5 - 5.1 mmol/L   Chloride 104 98 - 111 mmol/L   CO2 21 (L) 22 - 32 mmol/L   Glucose, Bld 149 (H) 70 - 99 mg/dL    Comment: Glucose reference range applies only to samples taken after fasting for at least 8 hours.   BUN 36 (H) 8 - 23 mg/dL   Creatinine, Ser 6.04 (H) 0.61 - 1.24 mg/dL    Calcium 8.4 (L) 8.9 - 10.3 mg/dL   GFR, Estimated 37 (L) >60 mL/min    Comment: (NOTE) Calculated using the CKD-EPI Creatinine Equation (2021)    Anion gap 11 5 - 15    Comment: Performed at [facility], [address], [place], [place] [address]    CT Renal Stone Study Result Date: 02/04/2024 CLINICAL DATA:  Left-sided flank pain for 1 week, initial encounter EXAM: CT ABDOMEN AND PELVIS WITHOUT CONTRAST TECHNIQUE: Multidetector CT imaging of the abdomen and pelvis was performed following the standard protocol without IV contrast. RADIATION DOSE REDUCTION: This exam was performed according to the departmental dose-optimization program which includes automated exposure control, adjustment of the mA and/or kV according to patient size and/or use of iterative reconstruction technique. COMPARISON:  09/01/2023 FINDINGS: Lower chest: No acute abnormality. Hepatobiliary: No focal liver abnormality is seen. No gallstones, gallbladder wall thickening, or biliary dilatation. Pancreas: Unremarkable. No pancreatic ductal dilatation or surrounding inflammatory changes. Spleen: Normal in size without focal abnormality. Adrenals/Urinary Tract: Adrenal glands are mildly hyperplastic bilaterally. 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 No obstructing stone is noted. These changes are new from the prior exam. Bladder has been surgically removed. Stomach/Bowel: Scattered diverticular change of the colon is noted. No evidence of diverticulitis is seen. The appendix is within normal limits. Stomach and small bowel are unremarkable. Vascular/Lymphatic: Aortic atherosclerosis. No enlarged abdominal or pelvic lymph nodes. Reproductive: Prostate has been surgically removed. Other: No abdominal wall hernia or abnormality. No abdominopelvic ascites.  Musculoskeletal: Degenerative changes of the lumbar spine and right hip are seen. IMPRESSION: Left-sided hydronephrosis without focal calculus. This extends all the way to the level of the urostomy. Diverticulosis without diverticulitis. Postoperative changes consistent with cystoprostatectomy. Electronically Signed   By: Violeta Grey M.D.   On: 02/04/2024 19:23    Review of Systems Blood pressure 137/70, pulse 69, temperature 97.9 F (36.6 C), temperature source Oral, resp. rate 20, SpO2 100%. Physical Exam  Assessment/Plan:  Fair prognosis with oligometasatic urothelial cancer. Low threshold for adjuvant sytemic therapy per med-onc.  His bacteremia with new mild hydro and Cr bump concerning for some possible obstruction.  Discussed option of stent placement (recommended, can be done at bedside) to max r/o and treat as well as help prevent and help clear any infectious parameters. Can also help preserve GFR in face of future chemo.     Melody Spurling. 02/04/2024, 8:42 PM

## 2024-02-04 NOTE — Progress Notes (Signed)
 PHARMACY NOTE:  ANTIMICROBIAL RENAL DOSAGE ADJUSTMENT  Current antimicrobial regimen includes a mismatch between antimicrobial dosage and estimated renal function.  As per policy approved by the Pharmacy & Therapeutics and Medical Executive Committees, the antimicrobial dosage will be adjusted accordingly.  Current antimicrobial dosage:  Amoxicillin 500mg  PO q12h  Indication:   UTI  Renal Function:  Estimated Creatinine Clearance: 40 mL/min (A) (by C-G formula based on SCr of 1.91 mg/dL (H)).    Antimicrobial dosage has been changed to:  Amoxicillin 500mg  PO q8h (no adjustment needed unless CrCl < 30 ml/min)   Thank you for allowing pharmacy to be a part of this patient's care.  Rulon Councilman, [facility] 02/04/2024 11:43 PM

## 2024-02-04 NOTE — ED Provider Notes (Signed)
 [name] AT [facility] Provider Note   CSN: [id number] Arrival date & time: 02/04/24  1311     History  Chief Complaint  Patient presents with   abnormal labs    Robert [name] is a 73 y.o. male.  HPI   73 year old male presents the emergency department after being called back with concern for positive blood cultures.  Past medical history of bladder cancer status post bladder/prostate and right kidney removal with urostomy.  Patient was seen 5 days ago when he came in after episode of chills/rigors and feeling unwell.  His initial workup was reassuring, cultures were drawn.  Those blood cultures came back positive as well as a urine culture and he has been contacted to come back for reevaluation and IV antibiotic therapy.  Patient states that he feels somewhat improved but is still feeling fatigued with intermittent chills.  He otherwise denies any new or acute symptoms.  Home Medications Prior to Admission medications   Medication Sig Start Date End Date Taking? Authorizing Provider  dapagliflozin propanediol (FARXIGA) 10 MG TABS tablet Take 1 tablet (10 mg total) by mouth daily before breakfast. Patient not taking: Reported on 12/13/2023 10/16/23   Gherghe, Cristina, MD  amLODipine (NORVASC) 5 MG tablet Take 1 tablet (5 mg total) by mouth daily. 09/13/23   Haviland, Julie, MD  apixaban (ELIQUIS) 5 MG TABS tablet Take 1 tablet (5 mg total) by mouth 2 (two) times daily. 12/21/23   Nahser, Lela Purple, MD  atorvastatin (LIPITOR) 40 MG tablet TAKE ONE TABLET BY MOUTH ONCE DAILY Patient taking differently: Take 40 mg by mouth at bedtime. 03/06/23   Emilie Harden, MD  B Complex-C (B-COMPLEX WITH VITAMIN C) tablet Take 1 tablet by mouth daily. Patient not taking: Reported on 01/31/2024    [provider]  cephALEXin (KEFLEX) 500 MG capsule Take 1 capsule (500 mg total) by mouth 2 (two) times daily for 5 days. 01/30/24 02/04/24  Rosealee Concha,  MD  Cholecalciferol (VITAMIN D-3 PO) Take 1 tablet by mouth daily. Patient not taking: Reported on 01/31/2024    [provider]  empagliflozin (JARDIANCE) 10 MG TABS tablet Take 1 tablet (10 mg total) by mouth daily before breakfast. Patient not taking: Reported on 12/13/2023 11/13/23   Emilie Harden, MD  EPINEPHrine 0.3 mg/0.3 mL IJ SOAJ injection Inject 0.3 mg into the muscle as needed for anaphylaxis. 09/14/23   [name], PA-C  glipiZIDE (GLUCOTROL XL) 2.5 MG 24 hr tablet Take 2.5 mg by mouth daily with breakfast.    [provider]  glucose blood test strip 1 each by Other route daily. Use daily for glucose control - Dx 250.00 04/21/20   [name], [name], MD  metFORMIN (GLUCOPHAGE-XR) 500 MG 24 hr tablet Take 4 tablets (2,000 mg total) by mouth daily with supper. Patient taking differently: Take 1,000 mg by mouth daily with supper. 02/03/23   Emilie Harden, MD  metoprolol succinate (TOPROL-XL) 25 MG 24 hr tablet Take 1 tablet (25 mg total) by mouth daily. 09/19/23   Nahser, Lela Purple, MD  oxyCODONE (OXY IR/ROXICODONE) 5 MG immediate release tablet Take 1 tablet (5 mg total) by mouth every 4 (four) hours as needed for breakthrough pain (post-operatively). 12/12/23   Melody Spurling., MD  propranolol (INDERAL) 10 MG tablet TAKE 1 TABLET BY MOUTH 4 TIMES DAILY AS NEEDED FOR PALPITATIONS, IRREGULAR OR FAST HEART RATE 10/25/22   Nahser, Lela Purple, MD  Semaglutide, 1  MG/DOSE, (OZEMPIC, 1 MG/DOSE,) 4 MG/3ML SOPN INJECT 1 MG UNDER THE SKIN ONCE A WEEK AS DIRECTED 07/20/23   Carlus Pavlov, MD  tadalafil (CIALIS) 5 MG tablet Take 1 tablet (5 mg total) by mouth daily. Patient taking differently: Take 5 mg by mouth daily as needed for erectile dysfunction. 07/03/23   Plotnikov, [name], MD  traMADol (ULTRAM) 50 MG tablet Take 1 tablet (50 mg total) by mouth every 6 (six) hours as needed for severe pain. 07/03/23   Plotnikov, [name], MD      Allergies     Lisinopril    Review of Systems   Review of Systems  Constitutional:  Positive for chills and fatigue. Negative for fever.  Respiratory:  Negative for shortness of breath.   Cardiovascular:  Negative for chest pain.  Gastrointestinal:  Negative for abdominal pain, diarrhea and vomiting.  Skin:  Negative for rash.  Neurological:  Negative for headaches.    Physical Exam Updated Vital Signs BP 134/78 (BP Location: Right Arm)   Pulse 75   Temp 98.3 F (36.8 C) (Oral)   Resp 18   SpO2 100%  Physical Exam Vitals and nursing note reviewed.  Constitutional:      General: He is not in acute distress.    Appearance: Normal appearance. He is not ill-appearing.  HENT:     Head: Normocephalic.     Mouth/Throat:     Mouth: Mucous membranes are moist.  Cardiovascular:     Rate and Rhythm: Normal rate.  Pulmonary:     Effort: Pulmonary effort is normal. No respiratory distress.  Abdominal:     Palpations: Abdomen is soft.     Tenderness: There is no abdominal tenderness.  Skin:    General: Skin is warm.  Neurological:     Mental Status: He is alert and oriented to person, place, and time. Mental status is at baseline.  Psychiatric:        Mood and Affect: Mood normal.     ED Results / Procedures / Treatments   Labs (all labs ordered are listed, but only abnormal results are displayed) Labs Reviewed  CBC WITH DIFFERENTIAL/PLATELET  BASIC METABOLIC PANEL WITH GFR    EKG None  Radiology No results found.  Procedures Procedures    Medications Ordered in ED Medications - No data to display  ED Course/ Medical Decision Making/ A&P                                 Medical Decision Making Amount and/or Complexity of Data Reviewed Labs: ordered. Radiology: ordered.  Risk Prescription drug management. Decision regarding hospitalization.   73 year old male presents emergency department after being called back with positive blood cultures and urine culture from  previous ER visit.  Of note he is status post right kidney, ureter and bladder removal secondary carcinoma with a urostomy.  Blood cultures were positive for Serratia, urine cultures positive for Serratia and Enterococcus.  I consulted pharmacy in regards to his age, medical history, kidney function.  They recommend putting him on oral amoxicillin 500 mg 3 times daily and IV cefepime 2 g every 12 hours.  They will continue to follow.  Blood work here is reassuring, kidney function is slightly uptrending.  CT renal study identifies left hydronephrosis leading to urostomy but no acute obstructive process.  Consulted with Dr. Berneice Heinrich for further guidance.  He agrees with IV antibiotics and they will  follow, no recommendation for acute intervention at this time.  Patients evaluation and results requires admission for further treatment and care.  Spoke with hospitalist, reviewed patient's ED course and they accept admission.  Patient agrees with admission plan, offers no new complaints and is stable/unchanged at time of admit.        Final Clinical Impression(s) / ED Diagnoses Final diagnoses:  None    Rx / DC Orders ED Discharge Orders     None         Flonnie Humphrey, DO 02/04/24 2200

## 2024-02-04 NOTE — Progress Notes (Signed)
 ED Antimicrobial Stewardship Positive Culture Follow Up   Robert [name] is an 73 y.o. male who presented to [facility] on 4/8 with a chief complaint of No chief complaint on file.   Recent Results (from the past 720 hours)  Resp panel by RT-PCR (RSV, Flu A&B, Covid) Anterior Nasal Swab     Status: None   Collection Time: 01/30/24  6:42 PM   Specimen: Anterior Nasal Swab  Result Value Ref Range Status   SARS Coronavirus 2 by RT PCR NEGATIVE NEGATIVE Final    Comment: (NOTE) SARS-CoV-2 target nucleic acids are NOT DETECTED.  The SARS-CoV-2 RNA is generally detectable in upper respiratory specimens during the acute phase of infection. The lowest concentration of SARS-CoV-2 viral copies this assay can detect is 138 copies/mL. A negative result does not preclude SARS-Cov-2 infection and should not be used as the sole basis for treatment or other patient management decisions. A negative result may occur with  improper specimen collection/handling, submission of specimen other than nasopharyngeal swab, presence of viral mutation(s) within the areas targeted by this assay, and inadequate number of viral copies(<138 copies/mL). A negative result must be combined with clinical observations, patient history, and epidemiological information. The expected result is Negative.  Fact Sheet for Patients:  BloggerCourse.com  Fact Sheet for Healthcare Providers:  SeriousBroker.it  This test is no t yet approved or cleared by the [place]  FDA and  has been authorized for detection and/or diagnosis of SARS-CoV-2 by FDA under an Emergency Use Authorization (EUA). This EUA will remain  in effect (meaning this test can be used) for the duration of the COVID-19 declaration under Section 564(b)(1) of the Act, 21 U.S.C.section 360bbb-3(b)(1), unless the authorization is terminated  or revoked sooner.       Influenza A by PCR NEGATIVE  NEGATIVE Final   Influenza B by PCR NEGATIVE NEGATIVE Final    Comment: (NOTE) The Xpert Xpress SARS-CoV-2/FLU/RSV plus assay is intended as an aid in the diagnosis of influenza from Nasopharyngeal swab specimens and should not be used as a sole basis for treatment. Nasal washings and aspirates are unacceptable for Xpert Xpress SARS-CoV-2/FLU/RSV testing.  Fact Sheet for Patients: BloggerCourse.com  Fact Sheet for Healthcare Providers: SeriousBroker.it  This test is not yet approved or cleared by the [place]  FDA and has been authorized for detection and/or diagnosis of SARS-CoV-2 by FDA under an Emergency Use Authorization (EUA). This EUA will remain in effect (meaning this test can be used) for the duration of the COVID-19 declaration under Section 564(b)(1) of the Act, 21 U.S.C. section 360bbb-3(b)(1), unless the authorization is terminated or revoked.     Resp Syncytial Virus by PCR NEGATIVE NEGATIVE Final    Comment: (NOTE) Fact Sheet for Patients: BloggerCourse.com  Fact Sheet for Healthcare Providers: SeriousBroker.it  This test is not yet approved or cleared by the [place]  FDA and has been authorized for detection and/or diagnosis of SARS-CoV-2 by FDA under an Emergency Use Authorization (EUA). This EUA will remain in effect (meaning this test can be used) for the duration of the COVID-19 declaration under Section 564(b)(1) of the Act, 21 U.S.C. section 360bbb-3(b)(1), unless the authorization is terminated or revoked.  Performed at [facility], [address] [place], [place] [address]   Blood Culture (routine x 2)     Status: Abnormal   Collection Time: 01/30/24  7:08 PM   Specimen: BLOOD LEFT ARM  Result Value Ref Range Status   Specimen Description  Final    BLOOD LEFT ARM Performed at [facility], [address]  [place], [place] [address]    Special Requests   Final    BOTTLES DRAWN AEROBIC AND ANAEROBIC Blood Culture results may not be optimal due to an inadequate volume of blood received in culture bottles Performed at [facility], [address] [place], [place] [address]    Culture  Setup Time   Final    GRAM NEGATIVE RODS AEROBIC BOTTLE ONLY CRITICAL RESULT CALLED TO, READ BACK BY AND VERIFIED WITH: M FOX,RN@[id number]  02/01/24 MK Performed at [facility], [address], [place], [place] [address]    Culture SERRATIA MARCESCENS (A)  Final   Report Status 02/03/2024 FINAL  Final   Organism ID, Bacteria SERRATIA MARCESCENS  Final      Susceptibility   Serratia marcescens - MIC*    CEFEPIME 0.5 SENSITIVE Sensitive     CEFTAZIDIME <=1 SENSITIVE Sensitive     CEFTRIAXONE <=0.25 SENSITIVE Sensitive     CIPROFLOXACIN <=0.25 SENSITIVE Sensitive     GENTAMICIN <=1 SENSITIVE Sensitive     TRIMETH/SULFA <=20 SENSITIVE Sensitive     * SERRATIA MARCESCENS  Blood Culture ID Panel (Reflexed)     Status: Abnormal   Collection Time: 01/30/24  7:08 PM  Result Value Ref Range Status   Enterococcus faecalis NOT DETECTED NOT DETECTED Final   Enterococcus Faecium NOT DETECTED NOT DETECTED Final   Listeria monocytogenes NOT DETECTED NOT DETECTED Final   Staphylococcus species NOT DETECTED NOT DETECTED Final   Staphylococcus aureus (BCID) NOT DETECTED NOT DETECTED Final   Staphylococcus epidermidis NOT DETECTED NOT DETECTED Final   Staphylococcus lugdunensis NOT DETECTED NOT DETECTED Final   Streptococcus species NOT DETECTED NOT DETECTED Final   Streptococcus agalactiae NOT DETECTED NOT DETECTED Final   Streptococcus pneumoniae NOT DETECTED NOT DETECTED Final   Streptococcus pyogenes NOT DETECTED NOT DETECTED Final   A.calcoaceticus-baumannii NOT DETECTED NOT DETECTED Final   Bacteroides fragilis NOT DETECTED NOT DETECTED Final   Enterobacterales DETECTED (A) NOT DETECTED Final     Comment: Enterobacterales represent a large order of gram negative bacteria, not a single organism. CRITICAL RESULT CALLED TO, READ BACK BY AND VERIFIED WITH: M FOX,RN@[id number]  02/01/24 MK    Enterobacter cloacae complex NOT DETECTED NOT DETECTED Final   Escherichia coli NOT DETECTED NOT DETECTED Final   Klebsiella aerogenes NOT DETECTED NOT DETECTED Final   Klebsiella oxytoca NOT DETECTED NOT DETECTED Final   Klebsiella pneumoniae NOT DETECTED NOT DETECTED Final   Proteus species NOT DETECTED NOT DETECTED Final   Salmonella species NOT DETECTED NOT DETECTED Final   Serratia marcescens DETECTED (A) NOT DETECTED Final    Comment: CRITICAL RESULT CALLED TO, READ BACK BY AND VERIFIED WITH: M FOX,RN@[id number]  02/01/24 MK    Haemophilus influenzae NOT DETECTED NOT DETECTED Final   Neisseria meningitidis NOT DETECTED NOT DETECTED Final   Pseudomonas aeruginosa NOT DETECTED NOT DETECTED Final   Stenotrophomonas maltophilia NOT DETECTED NOT DETECTED Final   Candida albicans NOT DETECTED NOT DETECTED Final   Candida auris NOT DETECTED NOT DETECTED Final   Candida glabrata NOT DETECTED NOT DETECTED Final   Candida krusei NOT DETECTED NOT DETECTED Final   Candida parapsilosis NOT DETECTED NOT DETECTED Final   Candida tropicalis NOT DETECTED NOT DETECTED Final   Cryptococcus neoformans/gattii NOT DETECTED NOT DETECTED Final   CTX-M ESBL NOT DETECTED NOT DETECTED Final   Carbapenem resistance IMP NOT DETECTED NOT DETECTED Final  Carbapenem resistance KPC NOT DETECTED NOT DETECTED Final   Carbapenem resistance NDM NOT DETECTED NOT DETECTED Final   Carbapenem resist OXA 48 LIKE NOT DETECTED NOT DETECTED Final   Carbapenem resistance VIM NOT DETECTED NOT DETECTED Final    Comment: Performed at [facility], [address] [place], [place] [address]  Urine Culture     Status: Abnormal   Collection Time: 01/30/24  7:54 PM   Specimen: Urine, Random  Result Value Ref Range Status   Specimen  Description   Final    URINE, RANDOM Performed at [facility], [address] [place], [place] [address]    Special Requests   Final    NONE Reflexed from [id number] Performed at [facility] [address], [place], [place] [address]    Culture (A)  Final    >=100,000 COLONIES/mL SERRATIA MARCESCENS >=100,000 COLONIES/mL ENTEROCOCCUS FAECALIS    Report Status 02/02/2024 FINAL  Final   Organism ID, Bacteria SERRATIA MARCESCENS (A)  Final   Organism ID, Bacteria ENTEROCOCCUS FAECALIS (A)  Final      Susceptibility   Enterococcus faecalis - MIC*    AMPICILLIN <=2 SENSITIVE Sensitive     NITROFURANTOIN <=16 SENSITIVE Sensitive     VANCOMYCIN 2 SENSITIVE Sensitive     * >=100,000 COLONIES/mL ENTEROCOCCUS FAECALIS   Serratia marcescens - MIC*    CEFEPIME 0.5 SENSITIVE Sensitive     CEFTRIAXONE 8 RESISTANT Resistant     CIPROFLOXACIN <=0.25 SENSITIVE Sensitive     GENTAMICIN <=1 SENSITIVE Sensitive     NITROFURANTOIN 256 RESISTANT Resistant     TRIMETH/SULFA <=20 SENSITIVE Sensitive     * >=100,000 COLONIES/mL SERRATIA MARCESCENS    [x]  Treated with cephalexin, organism resistant to prescribed antimicrobial  4/13 Patient called again by ED PA and asked to come back in for antibiotics to treat Serratia/E.faecalis UTI and Serratia bacteremia.  Patient stated that he planned to come in around 1pm.    ED Provider: Sonnie Dusky PA   Kendall Pauls PharmD, BCPS Clinical Pharmacist [phone]

## 2024-02-05 ENCOUNTER — Ambulatory Visit: Payer: Medicare HMO | Admitting: Internal Medicine

## 2024-02-05 ENCOUNTER — Inpatient Hospital Stay (HOSPITAL_COMMUNITY)

## 2024-02-05 DIAGNOSIS — R7881 Bacteremia: Secondary | ICD-10-CM | POA: Diagnosis not present

## 2024-02-05 DIAGNOSIS — N390 Urinary tract infection, site not specified: Secondary | ICD-10-CM

## 2024-02-05 DIAGNOSIS — C675 Malignant neoplasm of bladder neck: Secondary | ICD-10-CM

## 2024-02-05 DIAGNOSIS — N1330 Unspecified hydronephrosis: Secondary | ICD-10-CM | POA: Diagnosis not present

## 2024-02-05 LAB — CBC
HCT: 27.3 % — ABNORMAL LOW (ref 39.0–52.0)
Hemoglobin: 8.9 g/dL — ABNORMAL LOW (ref 13.0–17.0)
MCH: 29.1 pg (ref 26.0–34.0)
MCHC: 32.6 g/dL (ref 30.0–36.0)
MCV: 89.2 fL (ref 80.0–100.0)
Platelets: 155 10*3/uL (ref 150–400)
RBC: 3.06 MIL/uL — ABNORMAL LOW (ref 4.22–5.81)
RDW: 13.6 % (ref 11.5–15.5)
WBC: 5.5 10*3/uL (ref 4.0–10.5)
nRBC: 0 % (ref 0.0–0.2)

## 2024-02-05 LAB — GLUCOSE, CAPILLARY
Glucose-Capillary: 112 mg/dL — ABNORMAL HIGH (ref 70–99)
Glucose-Capillary: 117 mg/dL — ABNORMAL HIGH (ref 70–99)
Glucose-Capillary: 284 mg/dL — ABNORMAL HIGH (ref 70–99)
Glucose-Capillary: 207 mg/dL — ABNORMAL HIGH (ref 70–99)
Glucose-Capillary: 259 mg/dL — ABNORMAL HIGH (ref 70–99)

## 2024-02-05 LAB — BASIC METABOLIC PANEL WITH GFR
Anion gap: 10 (ref 5–15)
BUN: 34 mg/dL — ABNORMAL HIGH (ref 8–23)
CO2: 22 mmol/L (ref 22–32)
Calcium: 8.5 mg/dL — ABNORMAL LOW (ref 8.9–10.3)
Chloride: 107 mmol/L (ref 98–111)
Creatinine, Ser: 1.93 mg/dL — ABNORMAL HIGH (ref 0.61–1.24)
GFR, Estimated: 36 mL/min — ABNORMAL LOW (ref >60)
Glucose, Bld: 99 mg/dL (ref 70–99)
Potassium: 3.6 mmol/L (ref 3.5–5.1)
Sodium: 139 mmol/L (ref 135–145)

## 2024-02-05 LAB — MAGNESIUM: Magnesium: 2.1 mg/dL (ref 1.7–2.4)

## 2024-02-05 LAB — PHOSPHORUS: Phosphorus: 4 mg/dL (ref 2.5–4.6)

## 2024-02-05 MED ORDER — SODIUM CHLORIDE 0.9% FLUSH: 10.0000 mL | INTRAVENOUS | Status: DC | PRN

## 2024-02-05 MED ORDER — SODIUM CHLORIDE 0.9% FLUSH
10.0000 mL | Freq: Two times a day (BID) | INTRAVENOUS | Status: DC
Start: 2024-02-05 — End: 2024-02-07
  Administered 2024-02-05 – 2024-02-07 (×5): 10 mL

## 2024-02-05 MED ORDER — CHLORHEXIDINE GLUCONATE CLOTH 2 % EX PADS
6.0000 | MEDICATED_PAD | Freq: Every day | CUTANEOUS | Status: DC
  Administered 2024-02-05 – 2024-02-07 (×3): 6 via TOPICAL

## 2024-02-05 NOTE — Assessment & Plan Note (Signed)
-   UA from 4/8 noted with large LE, neg nitrite, >50 WBC, many bacteria; urine culture growing Serratia and E faecalis -Continue cefepime and amoxicillin for now; ID also consulted

## 2024-02-05 NOTE — Consult Note (Signed)
 [facility]                                                                                        Patient Identification: Patient Name: Robert [name] MRN: [id number] Admit Date: 02/04/2024  1:16 PM Today's Date: 02/05/2024 Reason for consult: Bacteremia, complicated UTI  Requesting provider: Dr Frederick Peers  Principal Problem:   Bacteremia   Antibiotics:  Cefepime 4/13-c Amoxicillin 4/13-c  Lines/Hardware: port +  Assessment # Serratia marcescens bacteremia likely urinary source  # Complicated UTI  - no GI symptoms or concerns at port   # AKI  - CT with left sided hydronephrosis without focal calculus - Patient reports placement of stent yesterday by urology   Recommendations  - Continue cefepime as well as p.o. amoxicillin as is - Follow-up repeat blood cultures for clearance.  If continues to be bacteremic, may need to consider port removal - Monitor CBC and CMP on abtx - Universal/standard isolation precautions  Rest of the management as per the primary team. Please call with questions or concerns.  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   He was seen in the ED 4/8 for chills and rigors. He was given IVF and discharged on PO cephalexin for possible UTI.   At ED  afebrile Labs remarkable for Cr elevated to 1.91, WBC 5.5, Hb 9.2 4/8 UA with large leukocytes, negative nitrite, many bacteria, urine cx serratia marcescens and e faecalis 4/8 blood cx serratia marcescens, repeat blood cx 4/13 2/2 sets sent   CT renal study  Left-sided hydronephrosis without focal calculus. This extends all the way to the level of the urostomy.   Diverticulosis without diverticulitis.   Postoperative changes consistent with cystoprostatectomy.  ROS: General- Denies fever, chills, loss of appetite and loss of weight HEENT - Denies headache, blurry vision, neck pain, sinus pain Chest - Denies any chest pain, SOB or cough CVS- Denies any dizziness/lightheadedness, syncopal attacks, palpitations Abdomen- Denies any nausea, vomiting, abdominal pain, hematochezia and diarrhea Neuro - Denies any weakness, numbness, tingling sensation Psych - Denies any changes in mood irritability or depressive symptoms GU- Denies any burning, dysuria, hematuria or increased frequency of urination Skin - denies any rashes/lesions MSK - denies any joint pain/swelling or restricted ROM   Past Medical History:  Diagnosis Date   Anticoagulant long-term use    eliquis--- managed by cardiology   Arthritis    Bladder cancer [facility])    urologist--- dr Mena Goes;  overlapping   History of basal cell carcinoma (BCC) excision    History of COVID-19 05/2019   per pt mild symptoms that resolved   HLD (hyperlipidemia)  HTN (hypertension)    Hx of colonic polyps    Insulin dependent type 2 diabetes mellitus [facility])    endocrinologist--- dr Elvera Lennox --  (03-11-2022 per pt check blood sugar 1-2 times daily,  fasting sugar-- 118-120s)   Nocturia    Paroxysmal atrial fibrillation (HCC) 03/12/2019   cardiologist--- dr Melburn Popper   Ureterocele    Use of cane as ambulatory aid    Past Surgical History:  Procedure Laterality Date   BLEPHAROPLASTY Bilateral    per pt approx  2001;   upper eyelid's    COLONOSCOPY     last one approx 2023   CYSTOSCOPY W/ RETROGRADES N/A 12/08/2023   Procedure: CYSTOSCOPY;  Surgeon: Loletta Parish., MD;  Location: [facility];  Service: Urology;  Laterality: N/A;  390 MINUTES NEEDED FOR CASE   CYSTOSCOPY WITH URETEROSCOPY AND STENT PLACEMENT Right 12/02/2022   Procedure: CYSTOSCOPY WITH RIGHT URETEROSCOPY, RIGHT DIAGNOSTIC URETEROSCOPY;  Surgeon: Jerilee Field, MD;  Location: [facility];  Service: Urology;  Laterality: Right;  90 MINS FOR CASE   CYSTOSCOPY/RETROGRADE/URETEROSCOPY Bilateral 05/05/2023   Procedure: CYSTOSCOPY BILATERAL RETROGRADE PYELOGRAM RIGHT URETEROSCOPY RIGHT STENT PLACEMENT;  Surgeon: Jerilee Field, MD;  Location[facility];  Service: Urology;  Laterality: Bilateral;  90 MINS FOR CASE   IR IMAGING GUIDED PORT INSERTION  05/30/2023   PATELLAR TENDON REPAIR Left 04/16/2004   @[facility]    ROBOT ASSITED LAPAROSCOPIC NEPHROURETERECTOMY Right 12/08/2023   Procedure: RIGHT ROBOT ASSISTED Nephroureterectomy;  Surgeon: Loletta Parish., MD;  Location: [facility];  Service: Urology;  Laterality: Right;   ROBOT LAP RADICAL CYSTOPROSTATECTOMY PELVIC LYMPHADENECTOMY, NEOBLADDER  12/08/2023   Procedure: ROBOT ASSISTED LAPAROSCOPIC RADICAL CYSTOPROSTATECTOMY;  Surgeon: Loletta Parish., MD;  Location: [facility];  Service: Urology;;   TOTAL URETHRECTOMY,RADICAL N/A 12/08/2023   Procedure: TOTAL URETHRECTOMY,RADICAL;  Surgeon: Loletta Parish., MD;  Location: [facility];  Service: Urology;  Laterality: N/A;   TRANSURETHRAL RESECTION OF BLADDER TUMOR N/A 06/18/2021   Procedure: TRANSURETHRAL RESECTION OF BLADDER TUMOR (TURBT) WITH RIGHT URETERAL STENT PLACEMENT, RIGHT URETEROSCOPY WITH DISTRACTION OF TUMOR, FULGURATION;  Surgeon: Jerilee Field, MD;  Location: [facility];  Service: Urology;  Laterality: N/A;   TRANSURETHRAL RESECTION OF BLADDER TUMOR N/A 03/15/2022   Procedure: TRANSURETHRAL RESECTION OF BLADDER TUMOR (TURBT)  WITH BILATERAL URETEROSCOPY/URETHRAL/ DILATION/BILATERAL RETROGRADE PYELOGRAM/BIOPSY AND FULGURATION OF RIGHT URETERAL CANCER/RIGHT STENT PLACEMENT;  Surgeon: Jerilee Field, MD;  Location[facility];  Service: Urology;  Laterality: N/A;   TRANSURETHRAL RESECTION OF BLADDER TUMOR N/A 05/05/2023   Procedure: TRANSURETHRAL RESECTION OF BLADDER TUMOR (TURBT) BLADDER BIOSPY;  Surgeon: Jerilee Field, MD;  Location: [facility];  Service: Urology;  Laterality: N/A;   TRANSURETHRAL RESECTION OF BLADDER TUMOR WITH MITOMYCIN-C N/A 12/02/2022   Procedure: TRANSURETHRAL RESECTION OF BLADDER TUMOR;  Surgeon: Jerilee Field, MD;  Location: [facility];  Service: Urology;  Laterality: N/A;   TRANSURETHRAL RESECTION OF PROSTATE  05/05/2023   Procedure: TRANSURETHRAL RESECTION OF THE PROSTATE (TURP);  Surgeon: Jerilee Field, MD;  Location: [facility];  Service: Urology;;    Scheduled Meds:  amLODipine  5 mg Oral Daily   amoxicillin  500 mg Oral Q8H   apixaban  5 mg Oral BID   atorvastatin  40 mg Oral QHS   Chlorhexidine Gluconate Cloth  6 each Topical Daily   insulin aspart  0-6 Units Subcutaneous TID WC   metoprolol succinate  25 mg Oral Daily   sodium chloride flush  10-40 mL Intracatheter Q12H   sodium chloride flush  3 mL Intravenous Q12H   Continuous Infusions:  ceFEPime (MAXIPIME) IV 2 g (02/05/24 0615)   PRN Meds:.acetaminophen, melatonin, ondansetron (ZOFRAN) IV, polyethylene glycol, propranolol, sodium chloride flush, traMADol  Allergies  Allergen Reactions   Lisinopril Swelling   Social History   Socioeconomic History   Marital status: Married    Spouse name: Not on file   Number of children: 1   Years of education: Not on file   Highest education level: Some college, no degree  Occupational History   Occupation: DVD's   Occupation: Occupational hygienist: Self Employed  Tobacco Use   Smoking status: Some Days    Types:  Cigars    Last attempt to quit: 02/22/2011    Years since quitting: 12.9   Smokeless tobacco: Never  Vaping Use   Vaping status: Never Used  Substance and Sexual Activity   Alcohol use: Yes    Comment: 6-8oz /wk OCC MARTINI 2 TO 3 X WEEK   Drug use: Never   Sexual activity: Not on file  Other Topics Concern   Not on file  Social History Narrative   Patient is married he has been employed in Nurse, children's   Former smoker with occasional alcohol use   Social Drivers of Corporate investment banker Strain: Low Risk  (10/08/2023)   Overall Financial Resource Strain (CARDIA)    Difficulty of Paying Living Expenses: Not hard at all  Food Insecurity: No Food Insecurity (02/05/2024)   Hunger Vital Sign    Worried About Running Out of Food in the Last Year: Never true    Ran Out of Food in the Last Year: Never true  Transportation Needs: No Transportation Needs (02/05/2024)   PRAPARE - Administrator, Civil Service (Medical): No    Lack of Transportation (Non-Medical): No  Physical Activity: Insufficiently Active (10/08/2023)   Exercise Vital Sign    Days of Exercise per Week: 2 days    Minutes of Exercise per Session: 20 min  Stress: No Stress Concern Present (10/08/2023)   [employer] of Occupational Health - Occupational Stress Questionnaire    Feeling of Stress : Only a little  Social Connections: Unknown (02/05/2024)   Social Connection and Isolation Panel [NHANES]    Frequency of Communication with Friends and Family: More than three times a week    Frequency of Social Gatherings with Friends and Family: More than three times a week    Attends Religious Services: More than 4 times per year    Active Member of [employer] or Organizations: Patient declined    Attends Banker Meetings: Patient declined    Marital Status: Married  Catering manager Violence: Not At Risk (02/05/2024)   Humiliation, Afraid, Rape, and Kick questionnaire    Fear of Current  or Ex-Partner: No    Emotionally Abused: No    Physically Abused: No    Sexually Abused: No   Family History  Problem Relation Age of Onset   Pancreatic cancer Mother 14   Cancer Paternal Uncle        unknown type cancer   Aortic aneurysm Other        family history   Cancer Other        Breast (1st degree relative)   Colon cancer Neg Hx    Rectal cancer Neg Hx    Stomach cancer Neg Hx    Esophageal  cancer Neg Hx     Vitals BP 135/69 (BP Location: Left Arm)   Pulse 65   Temp 97.9 F (36.6 C) (Oral)   Resp 18   Ht 6\' 2"  (1.88 m)   Wt 93 kg Comment: 6 days ago  SpO2 96%   BMI 26.32 kg/m     Physical Exam Constitutional: Elderly male lying in the bed, not toxic appearing    Comments: HEENT WNL  Cardiovascular:     Rate and Rhythm: Normal rate and irregular rhythm.     Heart sounds: S1 and S2  Pulmonary:     Effort: Pulmonary effort is normal.     Comments: Normal breath sounds  Abdominal:     Palpations: Abdomen is soft.     Tenderness: Left lower quadrant urostomy with clear urine draining in the Urobag, nontender and nondistended  Musculoskeletal:        General: No swelling or tenderness in peripheral joints  Skin:    Comments: No rashes, no signs of infection in the port  Neurological:     General: Awake, alert and oriented, grossly nonfocal  Psychiatric:        Mood and Affect: Mood normal.    Pertinent Microbiology Results for orders placed or performed during the hospital encounter of 02/04/24  Culture, blood (Routine X 2) w Reflex to ID Panel     Status: None (Preliminary result)   Collection Time: 02/04/24 11:56 PM   Specimen: BLOOD LEFT ARM  Result Value Ref Range Status   Specimen Description BLOOD LEFT ARM  Final   Special Requests   Final    BOTTLES DRAWN AEROBIC AND ANAEROBIC Blood Culture results may not be optimal due to an inadequate volume of blood received in culture bottles   Culture   Final    NO GROWTH < 12 HOURS Performed at  [facility], [address], [place], [place] [address]    Report Status PENDING  Incomplete  Culture, blood (Routine X 2) w Reflex to ID Panel     Status: None (Preliminary result)   Collection Time: 02/05/24  5:52 AM   Specimen: BLOOD LEFT ARM  Result Value Ref Range Status   Specimen Description   Final    BLOOD LEFT ARM Performed at [facility], [address] [place], [place] [address]    Special Requests   Final    BOTTLES DRAWN AEROBIC AND ANAEROBIC Blood Culture results may not be optimal due to an inadequate volume of blood received in culture bottles Performed at [facility], [address], [place], [place] [address]    Culture PENDING  Incomplete   Report Status PENDING  Incomplete     Pertinent Lab seen by me: CBC    Component Value Date/Time   WBC 7.0 02/06/2024 0630   RBC 2.95 (L) 02/06/2024 0630   HGB 8.5 (L) 02/06/2024 0630   HGB 10.8 (L) 01/25/2024 1323   HCT 26.4 (L) 02/06/2024 0630   PLT 164 02/06/2024 0630   PLT 175 01/25/2024 1323   MCV 89.5 02/06/2024 0630   MCH 28.8 02/06/2024 0630   MCHC 32.2 02/06/2024 0630   RDW 13.5 02/06/2024 0630   LYMPHSABS 1.9 02/06/2024 0630   MONOABS 0.6 02/06/2024 0630   EOSABS 0.1 02/06/2024 0630   BASOSABS 0.1 02/06/2024 0630      Latest Ref Rng & Units 02/06/2024    6:30 AM 02/05/2024    5:52 AM 02/04/2024  4:55 PM  CMP  Glucose 70 - 99 mg/dL 604  99  540   BUN 8 - 23 mg/dL 29  34  36   Creatinine 0.61 - 1.24 mg/dL 9.81  1.91  4.78   Sodium 135 - 145 mmol/L 138  139  136   Potassium 3.5 - 5.1 mmol/L 3.6  3.6  3.6   Chloride 98 - 111 mmol/L 107  107  104   CO2 22 - 32 mmol/L 23  22  21    Calcium 8.9 - 10.3 mg/dL 8.2  8.5  8.4     Pertinent Imagings/Other Imagings Plain films and CT images have been personally visualized and interpreted; radiology reports have been reviewed. Decision making incorporated into the Impression / Recommendations.  DG Abd 1 View Result Date:  02/05/2024 CLINICAL DATA:  Left ureteral stent placement. History of bladder cancer with cystectomy and right nephrectomy. Left ureterostomy. EXAM: ABDOMEN - 1 VIEW COMPARISON:  Abdomen and pelvis CT dated 02/04/2024 FINDINGS: Normal bowel-gas pattern. Left ureteral stent with the proximal portion in the region of the left renal pelvis. The stent extends distally and loops back upon itself with the inferior portion of the stent located in the mid abdomen to the left of midline. This is not the course of the ureter on the recent CT. Moderate lumbar spine degenerative changes. Marked right hip degenerative changes. IMPRESSION: Left ureteral stent with the proximal portion in the region of the left renal pelvis. The stent extends distally and loops back upon itself with the inferior portion of the stent located in the mid abdomen to the left of midline. This is not the course of the ureter on the recent CT. This needs to be correlated with the findings at recent stent placement. Electronically Signed   By: Beckie Salts M.D.   On: 02/05/2024 12:08   CT Renal Stone Study Result Date: 02/04/2024 CLINICAL DATA:  Left-sided flank pain for 1 week, initial encounter EXAM: CT ABDOMEN AND PELVIS WITHOUT CONTRAST TECHNIQUE: Multidetector CT imaging of the abdomen and pelvis was performed following the standard protocol without IV contrast. RADIATION DOSE REDUCTION: This exam was performed according to the departmental dose-optimization program which includes automated exposure control, adjustment of the mA and/or kV according to patient size and/or use of iterative reconstruction technique. COMPARISON:  09/01/2023 FINDINGS: Lower chest: No acute abnormality. Hepatobiliary: No focal liver abnormality is seen. No gallstones, gallbladder wall thickening, or biliary dilatation. Pancreas: Unremarkable. No pancreatic ductal dilatation or surrounding inflammatory changes. Spleen: Normal in size without focal abnormality.  Adrenals/Urinary Tract: Adrenal glands are mildly hyperplastic bilaterally. 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 No obstructing stone is noted. These changes are new from the prior exam. Bladder has been surgically removed. Stomach/Bowel: Scattered diverticular change of the colon is noted. No evidence of diverticulitis is seen. The appendix is within normal limits. Stomach and small bowel are unremarkable. Vascular/Lymphatic: Aortic atherosclerosis. No enlarged abdominal or pelvic lymph nodes. Reproductive: Prostate has been surgically removed. Other: No abdominal wall hernia or abnormality. No abdominopelvic ascites. Musculoskeletal: Degenerative changes of the lumbar spine and right hip are seen. IMPRESSION: Left-sided hydronephrosis without focal calculus. This extends all the way to the level of the urostomy. Diverticulosis without diverticulitis. Postoperative changes consistent with cystoprostatectomy. Electronically Signed   By: Alcide Clever M.D.   On: 02/04/2024 19:23   DG  Chest Port 1 View Result Date: 01/30/2024 CLINICAL DATA:  Possible sepsis shaking EXAM: PORTABLE CHEST 1 VIEW COMPARISON:  02/27/2023 FINDINGS: Right-sided central venous port tip over the SVC. No focal opacity, pleural effusion or pneumothorax. Normal cardiac size IMPRESSION: No active disease. Electronically Signed   By: Esmeralda Hedge M.D.   On: 01/30/2024 20:38     I have personally spent 85 minutes involved in face-to-face and non-face-to-face activities for this patient on the day of the visit. Professional time spent includes the following activities: Preparing to see the patient (review of tests), Obtaining and/or reviewing separately obtained history (admission/discharge record), Performing a medically appropriate examination and/or evaluation , Ordering  medications/tests/procedures, referring and communicating with other health care professionals, Documenting clinical information in the EMR, Independently interpreting results (not separately reported), Communicating results to the patient/family/caregiver, Counseling and educating the patient/family/caregiver and Care coordination (not separately reported).  Electronically signed by:   Plan d/w requesting provider as well as ID pharm D  Of note, portions of this note may have been created with voice recognition software. While this note has been edited for accuracy, occasional wrong-word or 'sound-a-like' substitutions may have occurred due to the inherent limitations of voice recognition software.   Terre Ferri, MD Infectious Disease Physician [facility] Pager: [phone]

## 2024-02-05 NOTE — Assessment & Plan Note (Signed)
-   Continue amlodipine, Toprol

## 2024-02-05 NOTE — Assessment & Plan Note (Signed)
-   1/2 bottles positive for Serratia on 01/30/2024 -Repeat cultures drawn on 4/13 on admission; follow for clearance - continue cefepime for Serratia for now - ID consulted given underlying malignancy and recent surgery, now with stent placed per urology on 4/14 for hydronephrosis  - Watching repeat cultures for clearance; does have a PAC in place

## 2024-02-05 NOTE — Hospital Course (Signed)
 Robert [name] is a 73 y.o. male with hx of multifocal high-grade urothelial carcinoma s/p surgery in Feb 2025. Has not yet started immunotherapy but completed chemo. Followed by Dr. Maryalice Smaller.  He was seen recently in the ER on 01/30/2024 with fever and chills.  Urinalysis was concerning for infection and he was started on course of Keflex. Blood and urine cultures were obtained.  After turning positive, he was called to present back to the hospital for treatment and further workup.

## 2024-02-05 NOTE — Assessment & Plan Note (Addendum)
-   Hemoglobin relatively stable and no overt signs of bleeding - Continue Eliquis and Toprol

## 2024-02-05 NOTE — Assessment & Plan Note (Addendum)
-   s/p cystoprostatectomy + Rt nephroureterectomy + total urethrectomy + ICG sentinal / templante node dissection and left cutaneous ureterostomy 11/2023 for Stage pT4N1Mx urothelial cancer  - diagnosed initially July 2022 - has been treated with neoadj chemo and now is recommended to start immunotherapy per oncology (nivolumab to start soon, planned 1 year course); recently seen in office on 01/25/24

## 2024-02-05 NOTE — Assessment & Plan Note (Signed)
-   CT renal protocol performed on admission showing left-sided hydronephrosis with no focal calculus extending all the way to the level of the urostomy - Urology following; underwent stent placement on 02/05/2024

## 2024-02-05 NOTE — Progress Notes (Signed)
 Progress Note    Robert [name]   ZOX:[id number]  DOB: January 07, 1951  DOA: 02/04/2024     1 PCP: Tresa Garter, MD  Initial CC: chills, positive culture  Hospital Course: Robert [name] is a 73 y.o. male with hx of multifocal high-grade urothelial carcinoma s/p surgery in Feb 2025. Has not yet started immunotherapy but completed chemo. Followed by Dr. Mosetta Putt.  He was seen recently in the ER on 01/30/2024 with fever and chills.  Urinalysis was concerning for infection and he was started on course of Keflex. Blood and urine cultures were obtained.  After turning positive, he was called to present back to the hospital for treatment and further workup.  Interval History:  No further fever or chills when seen this morning.  Resting comfortably in bed.  Assessment and Plan: * Bacteremia - 1/2 bottles positive for Serratia on 01/30/2024 -Repeat cultures drawn on 4/13 on admission; follow for clearance - continue cefepime for Serratia for now - ID consulted given underlying malignancy and recent surgery, now with stent placed per urology on 4/14 for hydronephrosis    UTI (urinary tract infection) - UA from 4/8 noted with large LE, neg nitrite, >50 WBC, many bacteria; urine culture growing Serratia and E faecalis -Continue cefepime and amoxicillin for now; ID also consulted  Hydronephrosis of left kidney - CT renal protocol performed on admission showing left-sided hydronephrosis with no focal calculus extending all the way to the level of the urostomy - Urology following; tentative plan for stent placement  Bladder cancer [facility]) - s/p cystoprostatectomy + Rt nephroureterectomy + total urethrectomy + ICG sentinal / templante node dissection and left cutaneous ureterostomy 11/2023 for Stage [id number] urothelial cancer  - diagnosed initially July 2022 - has been treated with neoadj chemo and now is recommended to start immunotherapy per oncology (nivolumab to start soon, planned 1  year course); recently seen in office on 01/25/24  Paroxysmal atrial fibrillation (HCC) - Hemoglobin relatively stable and no overt signs of bleeding - Continue Eliquis and Toprol  Type 2 diabetes mellitus without complication, without long-term current use of insulin (HCC) - Last A1c 5.9% on 10/05/2019 4 - diet controlled - Continue SSI and CBG monitoring  Essential hypertension - Continue amlodipine, Toprol  Hyperlipidemia - Continue Lipitor   Old records reviewed in assessment of this patient  Antimicrobials: Cefepime 02/04/2024 >> current Amoxicillin 02/04/2024 >> current  DVT prophylaxis:   apixaban (ELIQUIS) tablet 5 mg   Code Status:   Code Status: Full Code  Mobility Assessment (Last 72 Hours)     Mobility Assessment     Row Name 02/05/24 0014           Does patient have an order for bedrest or is patient medically unstable No - Continue assessment       What is the highest level of mobility based on the progressive mobility assessment? Level 6 (Walks independently in room and hall) - Balance while walking in room without assist - Complete                Barriers to discharge: None Disposition Plan: Home HH orders placed: N/A Status is: Inpatient  Objective: Blood pressure 138/69, pulse 71, temperature 98.2 F (36.8 C), temperature source Oral, resp. rate 17, height 6\' 2"  (1.88 m), weight 93 kg, SpO2 99%.  Examination:  Physical Exam Constitutional:      General: He is not in acute distress.    Appearance: Normal appearance.  HENT:  Head: Normocephalic and atraumatic.     Mouth/Throat:     Mouth: Mucous membranes are moist.  Eyes:     Extraocular Movements: Extraocular movements intact.  Cardiovascular:     Rate and Rhythm: Normal rate and regular rhythm.  Pulmonary:     Effort: Pulmonary effort is normal. No respiratory distress.     Breath sounds: Normal breath sounds. No wheezing.  Abdominal:     General: Bowel sounds are normal.  There is no distension.     Palpations: Abdomen is soft.     Tenderness: There is no abdominal tenderness.  Genitourinary:    Comments: Urostomy tube noted anterior abdomen with clear yellow urine Musculoskeletal:        General: Normal range of motion.     Cervical back: Normal range of motion and neck supple.  Skin:    General: Skin is warm and dry.  Neurological:     General: No focal deficit present.     Mental Status: He is alert.  Psychiatric:        Mood and Affect: Mood normal.        Behavior: Behavior normal.      Consultants:  Urology ID  Procedures:    Data Reviewed: Results for orders placed or performed during the hospital encounter of 02/04/24 (from the past 24 hours)  CBC with Differential     Status: Abnormal   Collection Time: 02/04/24  4:55 PM  Result Value Ref Range   WBC 5.5 4.0 - 10.5 K/uL   RBC 3.17 (L) 4.22 - 5.81 MIL/uL   Hemoglobin 9.2 (L) 13.0 - 17.0 g/dL   HCT 81.1 (L) 91.4 - 78.2 %   MCV 88.6 80.0 - 100.0 fL   MCH 29.0 26.0 - 34.0 pg   MCHC 32.7 30.0 - 36.0 g/dL   RDW 95.6 21.3 - 08.6 %   Platelets 159 150 - 400 K/uL   nRBC 0.0 0.0 - 0.2 %   Neutrophils Relative % 57 %   Neutro Abs 3.1 1.7 - 7.7 K/uL   Lymphocytes Relative 30 %   Lymphs Abs 1.7 0.7 - 4.0 K/uL   Monocytes Relative 11 %   Monocytes Absolute 0.6 0.1 - 1.0 K/uL   Eosinophils Relative 1 %   Eosinophils Absolute 0.0 0.0 - 0.5 K/uL   Basophils Relative 0 %   Basophils Absolute 0.0 0.0 - 0.1 K/uL   WBC Morphology HIDE    Immature Granulocytes 1 %   Abs Immature Granulocytes 0.04 0.00 - 0.07 K/uL  Basic metabolic panel     Status: Abnormal   Collection Time: 02/04/24  4:55 PM  Result Value Ref Range   Sodium 136 135 - 145 mmol/L   Potassium 3.6 3.5 - 5.1 mmol/L   Chloride 104 98 - 111 mmol/L   CO2 21 (L) 22 - 32 mmol/L   Glucose, Bld 149 (H) 70 - 99 mg/dL   BUN 36 (H) 8 - 23 mg/dL   Creatinine, Ser 5.78 (H) 0.61 - 1.24 mg/dL   Calcium 8.4 (L) 8.9 - 10.3 mg/dL   GFR,  Estimated 37 (L) >60 mL/min   Anion gap 11 5 - 15  Culture, blood (Routine X 2) w Reflex to ID Panel     Status: None (Preliminary result)   Collection Time: 02/04/24 11:56 PM   Specimen: BLOOD LEFT ARM  Result Value Ref Range   Specimen Description BLOOD LEFT ARM    Special Requests  BOTTLES DRAWN AEROBIC AND ANAEROBIC Blood Culture results may not be optimal due to an inadequate volume of blood received in culture bottles   Culture      NO GROWTH < 12 HOURS Performed at [facility], [address], [place], [place] [address]    Report Status PENDING   Glucose, capillary     Status: Abnormal   Collection Time: 02/05/24  1:05 AM  Result Value Ref Range   Glucose-Capillary 112 (H) 70 - 99 mg/dL  Basic metabolic panel with GFR     Status: Abnormal   Collection Time: 02/05/24  5:52 AM  Result Value Ref Range   Sodium 139 135 - 145 mmol/L   Potassium 3.6 3.5 - 5.1 mmol/L   Chloride 107 98 - 111 mmol/L   CO2 22 22 - 32 mmol/L   Glucose, Bld 99 70 - 99 mg/dL   BUN 34 (H) 8 - 23 mg/dL   Creatinine, Ser 2.72 (H) 0.61 - 1.24 mg/dL   Calcium 8.5 (L) 8.9 - 10.3 mg/dL   GFR, Estimated 36 (L) >60 mL/min   Anion gap 10 5 - 15  CBC     Status: Abnormal   Collection Time: 02/05/24  5:52 AM  Result Value Ref Range   WBC 5.5 4.0 - 10.5 K/uL   RBC 3.06 (L) 4.22 - 5.81 MIL/uL   Hemoglobin 8.9 (L) 13.0 - 17.0 g/dL   HCT 53.6 (L) 64.4 - 03.4 %   MCV 89.2 80.0 - 100.0 fL   MCH 29.1 26.0 - 34.0 pg   MCHC 32.6 30.0 - 36.0 g/dL   RDW 74.2 59.5 - 63.8 %   Platelets 155 150 - 400 K/uL   nRBC 0.0 0.0 - 0.2 %  Magnesium     Status: None   Collection Time: 02/05/24  5:52 AM  Result Value Ref Range   Magnesium 2.1 1.7 - 2.4 mg/dL  Phosphorus     Status: None   Collection Time: 02/05/24  5:52 AM  Result Value Ref Range   Phosphorus 4.0 2.5 - 4.6 mg/dL  Culture, blood (Routine X 2) w Reflex to ID Panel     Status: None (Preliminary result)   Collection Time: 02/05/24  5:52 AM    Specimen: BLOOD LEFT ARM  Result Value Ref Range   Specimen Description      BLOOD LEFT ARM Performed at [facility], [address], [place], [place] [address]    Special Requests      BOTTLES DRAWN AEROBIC AND ANAEROBIC Blood Culture results may not be optimal due to an inadequate volume of blood received in culture bottles Performed at [facility], [address], [place], [place] [address]    Culture PENDING    Report Status PENDING   Glucose, capillary     Status: Abnormal   Collection Time: 02/05/24  7:33 AM  Result Value Ref Range   Glucose-Capillary 117 (H) 70 - 99 mg/dL    I have reviewed pertinent nursing notes, vitals, labs, and images as necessary. I have ordered labwork to follow up on as indicated.  I have reviewed the last notes from staff over past 24 hours. I have discussed patient's care plan and test results with nursing staff, CM/SW, and other staff as appropriate.  Time spent: Greater than 50% of the 55 minute visit was spent in counseling/coordination of care for the patient as laid out in the A&P.   LOS: 1 day  Faith Homes, MD [facility] 02/05/2024, 11:06 AM

## 2024-02-05 NOTE — Plan of Care (Signed)
  Problem: Education: Goal: Knowledge of the procedure and recovery process will improve Outcome: Progressing   Problem: Bowel/Gastric: Goal: Gastrointestinal status for postoperative course will improve Outcome: Progressing   Problem: Pain Management: Goal: General experience of comfort will improve Outcome: Progressing   Problem: Skin Integrity: Goal: Demonstration of wound healing without infection will improve Outcome: Progressing   Problem: Urinary Elimination: Goal: Ability to avoid or minimize complications of infection will improve Outcome: Progressing Goal: Ability to achieve and maintain urine output will improve Outcome: Progressing Goal: Home care management will improve Outcome: Progressing   Problem: Education: Goal: Ability to describe self-care measures that may prevent or decrease complications (Diabetes Survival Skills Education) will improve Outcome: Progressing Goal: Individualized Educational Video(s) Outcome: Progressing   Problem: Coping: Goal: Ability to adjust to condition or change in health will improve Outcome: Progressing   Problem: Fluid Volume: Goal: Ability to maintain a balanced intake and output will improve Outcome: Progressing   Problem: Health Behavior/Discharge Planning: Goal: Ability to identify and utilize available resources and services will improve Outcome: Progressing Goal: Ability to manage health-related needs will improve Outcome: Progressing   Problem: Metabolic: Goal: Ability to maintain appropriate glucose levels will improve Outcome: Progressing   Problem: Nutritional: Goal: Maintenance of adequate nutrition will improve Outcome: Progressing Goal: Progress toward achieving an optimal weight will improve Outcome: Progressing   Problem: Skin Integrity: Goal: Risk for impaired skin integrity will decrease Outcome: Progressing   Problem: Tissue Perfusion: Goal: Adequacy of tissue perfusion will improve Outcome:  Progressing   Problem: Education: Goal: Knowledge of General Education information will improve Description: Including pain rating scale, medication(s)/side effects and non-pharmacologic comfort measures Outcome: Progressing   Problem: Health Behavior/Discharge Planning: Goal: Ability to manage health-related needs will improve Outcome: Progressing   Problem: Clinical Measurements: Goal: Ability to maintain clinical measurements within normal limits will improve Outcome: Progressing Goal: Will remain free from infection Outcome: Progressing Goal: Diagnostic test results will improve Outcome: Progressing Goal: Respiratory complications will improve Outcome: Progressing Goal: Cardiovascular complication will be avoided Outcome: Progressing   Problem: Activity: Goal: Risk for activity intolerance will decrease Outcome: Progressing   Problem: Nutrition: Goal: Adequate nutrition will be maintained Outcome: Progressing   Problem: Coping: Goal: Level of anxiety will decrease Outcome: Progressing   Problem: Elimination: Goal: Will not experience complications related to bowel motility Outcome: Progressing Goal: Will not experience complications related to urinary retention Outcome: Progressing   Problem: Pain Managment: Goal: General experience of comfort will improve and/or be controlled Outcome: Progressing   Problem: Safety: Goal: Ability to remain free from injury will improve Outcome: Progressing   Problem: Skin Integrity: Goal: Risk for impaired skin integrity will decrease Outcome: Progressing

## 2024-02-05 NOTE — Assessment & Plan Note (Signed)
 -  Continue Lipitor

## 2024-02-05 NOTE — Assessment & Plan Note (Signed)
-   Last A1c 5.9% on 10/05/2019 4 - diet controlled - Continue SSI and CBG monitoring

## 2024-02-06 ENCOUNTER — Other Ambulatory Visit: Payer: Self-pay | Admitting: Hematology

## 2024-02-06 DIAGNOSIS — N179 Acute kidney failure, unspecified: Secondary | ICD-10-CM | POA: Diagnosis not present

## 2024-02-06 DIAGNOSIS — N1330 Unspecified hydronephrosis: Secondary | ICD-10-CM | POA: Diagnosis not present

## 2024-02-06 DIAGNOSIS — C675 Malignant neoplasm of bladder neck: Secondary | ICD-10-CM | POA: Diagnosis not present

## 2024-02-06 DIAGNOSIS — R7881 Bacteremia: Secondary | ICD-10-CM | POA: Diagnosis not present

## 2024-02-06 DIAGNOSIS — N390 Urinary tract infection, site not specified: Secondary | ICD-10-CM | POA: Diagnosis not present

## 2024-02-06 LAB — BASIC METABOLIC PANEL WITH GFR
Anion gap: 8 (ref 5–15)
BUN: 29 mg/dL — ABNORMAL HIGH (ref 8–23)
CO2: 23 mmol/L (ref 22–32)
Calcium: 8.2 mg/dL — ABNORMAL LOW (ref 8.9–10.3)
Chloride: 107 mmol/L (ref 98–111)
Creatinine, Ser: 1.46 mg/dL — ABNORMAL HIGH (ref 0.61–1.24)
GFR, Estimated: 50 mL/min — ABNORMAL LOW (ref >60)
Glucose, Bld: 150 mg/dL — ABNORMAL HIGH (ref 70–99)
Potassium: 3.6 mmol/L (ref 3.5–5.1)
Sodium: 138 mmol/L (ref 135–145)

## 2024-02-06 LAB — CBC WITH DIFFERENTIAL/PLATELET
Abs Immature Granulocytes: 0.12 10*3/uL — ABNORMAL HIGH (ref 0.00–0.07)
Basophils Absolute: 0.1 10*3/uL (ref 0.0–0.1)
Basophils Relative: 1 %
Eosinophils Absolute: 0.1 10*3/uL (ref 0.0–0.5)
Eosinophils Relative: 1 %
HCT: 26.4 % — ABNORMAL LOW (ref 39.0–52.0)
Hemoglobin: 8.5 g/dL — ABNORMAL LOW (ref 13.0–17.0)
Immature Granulocytes: 2 %
Lymphocytes Relative: 27 %
Lymphs Abs: 1.9 10*3/uL (ref 0.7–4.0)
MCH: 28.8 pg (ref 26.0–34.0)
MCHC: 32.2 g/dL (ref 30.0–36.0)
MCV: 89.5 fL (ref 80.0–100.0)
Monocytes Absolute: 0.6 10*3/uL (ref 0.1–1.0)
Monocytes Relative: 9 %
Neutro Abs: 4.3 10*3/uL (ref 1.7–7.7)
Neutrophils Relative %: 60 %
Platelets: 164 10*3/uL (ref 150–400)
RBC: 2.95 MIL/uL — ABNORMAL LOW (ref 4.22–5.81)
RDW: 13.5 % (ref 11.5–15.5)
Smear Review: NORMAL
WBC: 7 10*3/uL (ref 4.0–10.5)
nRBC: 0 % (ref 0.0–0.2)

## 2024-02-06 LAB — GLUCOSE, CAPILLARY
Glucose-Capillary: 151 mg/dL — ABNORMAL HIGH (ref 70–99)
Glucose-Capillary: 210 mg/dL — ABNORMAL HIGH (ref 70–99)
Glucose-Capillary: 198 mg/dL — ABNORMAL HIGH (ref 70–99)
Glucose-Capillary: 205 mg/dL — ABNORMAL HIGH (ref 70–99)

## 2024-02-06 LAB — MAGNESIUM: Magnesium: 2 mg/dL (ref 1.7–2.4)

## 2024-02-06 NOTE — Progress Notes (Signed)
 Robert [name]   DOB:1950-12-23   JY#:[id number]   YQM#:[id number]  Medical oncology follow-up note  Subjective: Patient is well-known to me, under my care for his bladder cancer.  He was admitted to the hospital due to bacteremia.  He was initially seen in the ED on December 02, 2023 for fever and UTI, blood culture was  positive for Serratia .  He was called back and admitted to the hospital.  He is doing well since admission, no recurrent fever or chills.   Objective:  Vitals:   02/06/24 0438 02/06/24 1433  BP: 135/69 127/68  Pulse: 65 74  Resp: 18 18  Temp: 97.9 F (36.6 C) 98 F (36.7 C)  SpO2: 96% 97%    Body mass index is 26.32 kg/m.  Intake/Output Summary (Last 24 hours) at 02/06/2024 1724 Last data filed at 02/06/2024 1433 Gross per 24 hour  Intake 920 ml  Output 1025 ml  Net -105 ml     Sclerae unicteric  Oropharynx clear  No peripheral adenopathy  Lungs clear -- no rales or rhonchi  Heart regular rate and rhythm  Abdomen benign  MSK no focal spinal tenderness, no peripheral edema  Neuro nonfocal   CBG (last 3)  Recent Labs    02/06/24 0741 02/06/24 1151 02/06/24 1651  GLUCAP 151* 210* 198*     Labs:  Lab Results  Component Value Date   WBC 7.0 02/06/2024   HGB 8.5 (L) 02/06/2024   HCT 26.4 (L) 02/06/2024   MCV 89.5 02/06/2024   PLT 164 02/06/2024   NEUTROABS 4.3 02/06/2024     Urine Studies No results for input(s): "UHGB", "CRYS" in the last 72 hours.  Invalid input(s): "UACOL", "UAPR", "USPG", "UPH", "UTP", "UGL", "UKET", "UBIL", "UNIT", "UROB", "ULEU", "UEPI", "UWBC", "URBC", "UBAC", "CAST", "UCOM", "BILUA"  Basic Metabolic Panel: Recent Labs  Lab 01/30/24 1908 02/04/24 1655 02/05/24 0552 02/06/24 0630  NA 133* 136 139 138  K 3.8 3.6 3.6 3.6  CL 102 104 107 107  CO2 20* 21* 22 23  GLUCOSE 157* 149* 99 150*  BUN 36* 36* 34* 29*  CREATININE 1.79* 1.91* 1.93* 1.46*  CALCIUM 8.9 8.4* 8.5* 8.2*  MG  --   --  2.1 2.0  PHOS  --    --  4.0  --    GFR Estimated Creatinine Clearance: 52.4 mL/min (A) (by C-G formula based on SCr of 1.46 mg/dL (H)). Liver Function Tests: Recent Labs  Lab 01/30/24 1908  AST 13*  ALT 11  ALKPHOS 63  BILITOT 0.8  PROT 6.6  ALBUMIN 3.7   No results for input(s): "LIPASE", "AMYLASE" in the last 168 hours. No results for input(s): "AMMONIA" in the last 168 hours. Coagulation profile No results for input(s): "INR", "PROTIME" in the last 168 hours.  CBC: Recent Labs  Lab 01/30/24 1908 02/04/24 1655 02/05/24 0552 02/06/24 0630  WBC 9.2 5.5 5.5 7.0  NEUTROABS 7.9* 3.1  --  4.3  HGB 10.1* 9.2* 8.9* 8.5*  HCT 30.0* 28.1* 27.3* 26.4*  MCV 87.5 88.6 89.2 89.5  PLT 134* 159 155 164   Cardiac Enzymes: No results for input(s): "CKTOTAL", "CKMB", "CKMBINDEX", "TROPONINI" in the last 168 hours. BNP: Invalid input(s): "POCBNP" CBG: Recent Labs  Lab 02/05/24 1702 02/05/24 2146 02/06/24 0741 02/06/24 1151 02/06/24 1651  GLUCAP 207* 259* 151* 210* 198*   D-Dimer No results for input(s): "DDIMER" in the last 72 hours. Hgb A1c No results for input(s): "HGBA1C" in the last 72 hours.  Lipid Profile No results for input(s): "CHOL", "HDL", "LDLCALC", "TRIG", "CHOLHDL", "LDLDIRECT" in the last 72 hours. Thyroid function studies No results for input(s): "TSH", "T4TOTAL", "T3FREE", "THYROIDAB" in the last 72 hours.  Invalid input(s): "FREET3" Anemia work up No results for input(s): "VITAMINB12", "FOLATE", "FERRITIN", "TIBC", "IRON", "RETICCTPCT" in the last 72 hours. Microbiology Recent Results (from the past 240 hours)  Resp panel by RT-PCR (RSV, Flu A&B, Covid) Anterior Nasal Swab     Status: None   Collection Time: 01/30/24  6:42 PM   Specimen: Anterior Nasal Swab  Result Value Ref Range Status   SARS Coronavirus 2 by RT PCR NEGATIVE NEGATIVE Final    Comment: (NOTE) SARS-CoV-2 target nucleic acids are NOT DETECTED.  The SARS-CoV-2 RNA is generally detectable in upper  respiratory specimens during the acute phase of infection. The lowest concentration of SARS-CoV-2 viral copies this assay can detect is 138 copies/mL. A negative result does not preclude SARS-Cov-2 infection and should not be used as the sole basis for treatment or other patient management decisions. A negative result may occur with  improper specimen collection/handling, submission of specimen other than nasopharyngeal swab, presence of viral mutation(s) within the areas targeted by this assay, and inadequate number of viral copies(<138 copies/mL). A negative result must be combined with clinical observations, patient history, and epidemiological information. The expected result is Negative.  Fact Sheet for Patients:  BloggerCourse.com  Fact Sheet for Healthcare Providers:  SeriousBroker.it  This test is no t yet approved or cleared by the [place]  FDA and  has been authorized for detection and/or diagnosis of SARS-CoV-2 by FDA under an Emergency Use Authorization (EUA). This EUA will remain  in effect (meaning this test can be used) for the duration of the COVID-19 declaration under Section 564(b)(1) of the Act, 21 U.S.C.section 360bbb-3(b)(1), unless the authorization is terminated  or revoked sooner.       Influenza A by PCR NEGATIVE NEGATIVE Final   Influenza B by PCR NEGATIVE NEGATIVE Final    Comment: (NOTE) The Xpert Xpress SARS-CoV-2/FLU/RSV plus assay is intended as an aid in the diagnosis of influenza from Nasopharyngeal swab specimens and should not be used as a sole basis for treatment. Nasal washings and aspirates are unacceptable for Xpert Xpress SARS-CoV-2/FLU/RSV testing.  Fact Sheet for Patients: BloggerCourse.com  Fact Sheet for Healthcare Providers: SeriousBroker.it  This test is not yet approved or cleared by the [place]  FDA and has been  authorized for detection and/or diagnosis of SARS-CoV-2 by FDA under an Emergency Use Authorization (EUA). This EUA will remain in effect (meaning this test can be used) for the duration of the COVID-19 declaration under Section 564(b)(1) of the Act, 21 U.S.C. section 360bbb-3(b)(1), unless the authorization is terminated or revoked.     Resp Syncytial Virus by PCR NEGATIVE NEGATIVE Final    Comment: (NOTE) Fact Sheet for Patients: BloggerCourse.com  Fact Sheet for Healthcare Providers: SeriousBroker.it  This test is not yet approved or cleared by the [place]  FDA and has been authorized for detection and/or diagnosis of SARS-CoV-2 by FDA under an Emergency Use Authorization (EUA). This EUA will remain in effect (meaning this test can be used) for the duration of the COVID-19 declaration under Section 564(b)(1) of the Act, 21 U.S.C. section 360bbb-3(b)(1), unless the authorization is terminated or revoked.  Performed at [facility], [address], [place], [place] [address]   Blood Culture (routine x 2)     Status: Abnormal   Collection Time:  01/30/24  7:08 PM   Specimen: BLOOD LEFT ARM  Result Value Ref Range Status   Specimen Description   Final    BLOOD LEFT ARM Performed at [facility], [address] [place], [place] [address]    Special Requests   Final    BOTTLES DRAWN AEROBIC AND ANAEROBIC Blood Culture results may not be optimal due to an inadequate volume of blood received in culture bottles Performed at [facility], [address], [place], [place] [address]    Culture  Setup Time   Final    GRAM NEGATIVE RODS AEROBIC BOTTLE ONLY CRITICAL RESULT CALLED TO, READ BACK BY AND VERIFIED WITH: Kendall Flack  02/01/24 MK Performed at [facility], [address] [place], [place] [address]    Culture SERRATIA MARCESCENS (A)  Final   Report Status 02/03/2024  FINAL  Final   Organism ID, Bacteria SERRATIA MARCESCENS  Final      Susceptibility   Serratia marcescens - MIC*    CEFEPIME 0.5 SENSITIVE Sensitive     CEFTAZIDIME <=1 SENSITIVE Sensitive     CEFTRIAXONE <=0.25 SENSITIVE Sensitive     CIPROFLOXACIN <=0.25 SENSITIVE Sensitive     GENTAMICIN <=1 SENSITIVE Sensitive     TRIMETH/SULFA <=20 SENSITIVE Sensitive     * SERRATIA MARCESCENS  Blood Culture ID Panel (Reflexed)     Status: Abnormal   Collection Time: 01/30/24  7:08 PM  Result Value Ref Range Status   Enterococcus faecalis NOT DETECTED NOT DETECTED Final   Enterococcus Faecium NOT DETECTED NOT DETECTED Final   Listeria monocytogenes NOT DETECTED NOT DETECTED Final   Staphylococcus species NOT DETECTED NOT DETECTED Final   Staphylococcus aureus (BCID) NOT DETECTED NOT DETECTED Final   Staphylococcus epidermidis NOT DETECTED NOT DETECTED Final   Staphylococcus lugdunensis NOT DETECTED NOT DETECTED Final   Streptococcus species NOT DETECTED NOT DETECTED Final   Streptococcus agalactiae NOT DETECTED NOT DETECTED Final   Streptococcus pneumoniae NOT DETECTED NOT DETECTED Final   Streptococcus pyogenes NOT DETECTED NOT DETECTED Final   A.calcoaceticus-baumannii NOT DETECTED NOT DETECTED Final   Bacteroides fragilis NOT DETECTED NOT DETECTED Final   Enterobacterales DETECTED (A) NOT DETECTED Final    Comment: Enterobacterales represent a large order of gram negative bacteria, not a single organism. CRITICAL RESULT CALLED TO, READ BACK BY AND VERIFIED WITH: M FOX,RN@[id number]  02/01/24 MK    Enterobacter cloacae complex NOT DETECTED NOT DETECTED Final   Escherichia coli NOT DETECTED NOT DETECTED Final   Klebsiella aerogenes NOT DETECTED NOT DETECTED Final   Klebsiella oxytoca NOT DETECTED NOT DETECTED Final   Klebsiella pneumoniae NOT DETECTED NOT DETECTED Final   Proteus species NOT DETECTED NOT DETECTED Final   Salmonella species NOT DETECTED NOT DETECTED Final   Serratia marcescens  DETECTED (A) NOT DETECTED Final    Comment: CRITICAL RESULT CALLED TO, READ BACK BY AND VERIFIED WITH: M FOX,RN@[id number]  02/01/24 MK    Haemophilus influenzae NOT DETECTED NOT DETECTED Final   Neisseria meningitidis NOT DETECTED NOT DETECTED Final   Pseudomonas aeruginosa NOT DETECTED NOT DETECTED Final   Stenotrophomonas maltophilia NOT DETECTED NOT DETECTED Final   Candida albicans NOT DETECTED NOT DETECTED Final   Candida auris NOT DETECTED NOT DETECTED Final   Candida glabrata NOT DETECTED NOT DETECTED Final   Candida krusei NOT DETECTED NOT DETECTED Final   Candida parapsilosis NOT DETECTED NOT DETECTED Final   Candida tropicalis NOT DETECTED NOT DETECTED Final   Cryptococcus neoformans/gattii NOT DETECTED  NOT DETECTED Final   CTX-M ESBL NOT DETECTED NOT DETECTED Final   Carbapenem resistance IMP NOT DETECTED NOT DETECTED Final   Carbapenem resistance KPC NOT DETECTED NOT DETECTED Final   Carbapenem resistance NDM NOT DETECTED NOT DETECTED Final   Carbapenem resist OXA 48 LIKE NOT DETECTED NOT DETECTED Final   Carbapenem resistance VIM NOT DETECTED NOT DETECTED Final    Comment: Performed at [facility], [address], [place], [place] [address]  Urine Culture     Status: Abnormal   Collection Time: 01/30/24  7:54 PM   Specimen: Urine, Random  Result Value Ref Range Status   Specimen Description   Final    URINE, RANDOM Performed at [facility], [address] [place], [place] [address]    Special Requests   Final    NONE Reflexed from [id number] Performed at [facility], [address] [place], [place] [address]    Culture (A)  Final    >=100,000 COLONIES/mL SERRATIA MARCESCENS >=100,000 COLONIES/mL ENTEROCOCCUS FAECALIS    Report Status 02/02/2024 FINAL  Final   Organism ID, Bacteria SERRATIA MARCESCENS (A)  Final   Organism ID, Bacteria ENTEROCOCCUS FAECALIS (A)  Final      Susceptibility   Enterococcus faecalis - MIC*     AMPICILLIN <=2 SENSITIVE Sensitive     NITROFURANTOIN <=16 SENSITIVE Sensitive     VANCOMYCIN 2 SENSITIVE Sensitive     * >=100,000 COLONIES/mL ENTEROCOCCUS FAECALIS   Serratia marcescens - MIC*    CEFEPIME 0.5 SENSITIVE Sensitive     CEFTRIAXONE 8 RESISTANT Resistant     CIPROFLOXACIN <=0.25 SENSITIVE Sensitive     GENTAMICIN <=1 SENSITIVE Sensitive     NITROFURANTOIN 256 RESISTANT Resistant     TRIMETH/SULFA <=20 SENSITIVE Sensitive     * >=100,000 COLONIES/mL SERRATIA MARCESCENS  Culture, blood (Routine X 2) w Reflex to ID Panel     Status: None (Preliminary result)   Collection Time: 02/04/24 11:56 PM   Specimen: BLOOD LEFT ARM  Result Value Ref Range Status   Specimen Description BLOOD LEFT ARM  Final   Special Requests   Final    BOTTLES DRAWN AEROBIC AND ANAEROBIC Blood Culture results may not be optimal due to an inadequate volume of blood received in culture bottles   Culture   Final    NO GROWTH 1 DAY Performed at [facility], [address], [place], [place] [address]    Report Status PENDING  Incomplete  Culture, blood (Routine X 2) w Reflex to ID Panel     Status: None (Preliminary result)   Collection Time: 02/05/24  5:52 AM   Specimen: BLOOD LEFT ARM  Result Value Ref Range Status   Specimen Description   Final    BLOOD LEFT ARM Performed at [facility], [address], [place], [place] [address]    Special Requests   Final    BOTTLES DRAWN AEROBIC AND ANAEROBIC Blood Culture results may not be optimal due to an inadequate volume of blood received in culture bottles Performed at [facility], [address], [place], [place] [address]    Culture   Final    NO GROWTH < 24 HOURS Performed at [facility], [address], [place], [place] [address]    Report Status PENDING  Incomplete      Studies:  DG Abd 1 View Result Date: 02/05/2024 CLINICAL DATA:  Left ureteral stent placement. History  of bladder cancer with  cystectomy and right nephrectomy. Left ureterostomy. EXAM: ABDOMEN - 1 VIEW COMPARISON:  Abdomen and pelvis CT dated 02/04/2024 FINDINGS: Normal bowel-gas pattern. Left ureteral stent with the proximal portion in the region of the left renal pelvis. The stent extends distally and loops back upon itself with the inferior portion of the stent located in the mid abdomen to the left of midline. This is not the course of the ureter on the recent CT. Moderate lumbar spine degenerative changes. Marked right hip degenerative changes. IMPRESSION: Left ureteral stent with the proximal portion in the region of the left renal pelvis. The stent extends distally and loops back upon itself with the inferior portion of the stent located in the mid abdomen to the left of midline. This is not the course of the ureter on the recent CT. This needs to be correlated with the findings at recent stent placement. Electronically Signed   By: Catherin Closs M.D.   On: 02/05/2024 12:08   CT Renal Stone Study Result Date: 02/04/2024 CLINICAL DATA:  Left-sided flank pain for 1 week, initial encounter EXAM: CT ABDOMEN AND PELVIS WITHOUT CONTRAST TECHNIQUE: Multidetector CT imaging of the abdomen and pelvis was performed following the standard protocol without IV contrast. RADIATION DOSE REDUCTION: This exam was performed according to the departmental dose-optimization program which includes automated exposure control, adjustment of the mA and/or kV according to patient size and/or use of iterative reconstruction technique. COMPARISON:  09/01/2023 FINDINGS: Lower chest: No acute abnormality. Hepatobiliary: No focal liver abnormality is seen. No gallstones, gallbladder wall thickening, or biliary dilatation. Pancreas: Unremarkable. No pancreatic ductal dilatation or surrounding inflammatory changes. Spleen: Normal in size without focal abnormality. Adrenals/Urinary Tract: Adrenal glands are mildly hyperplastic bilaterally. 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 No obstructing stone is noted. These changes are new from the prior exam. Bladder has been surgically removed. Stomach/Bowel: Scattered diverticular change of the colon is noted. No evidence of diverticulitis is seen. The appendix is within normal limits. Stomach and small bowel are unremarkable. Vascular/Lymphatic: Aortic atherosclerosis. No enlarged abdominal or pelvic lymph nodes. Reproductive: Prostate has been surgically removed. Other: No abdominal wall hernia or abnormality. No abdominopelvic ascites. Musculoskeletal: Degenerative changes of the lumbar spine and right hip are seen. IMPRESSION: Left-sided hydronephrosis without focal calculus. This extends all the way to the level of the urostomy. Diverticulosis without diverticulitis. Postoperative changes consistent with cystoprostatectomy. Electronically Signed   By: Violeta Grey M.D.   On: 02/04/2024 19:23    Assessment: 73 y.o. male   positive for Serratia bacteriemia  UTI Bladder cancer, status post neoadjuvant chemo and surgery PAF Type 2 diabetes    Plan:  - His repeated blood culture has been negative, he is doing well on antibiotics - He is scheduled to start adjuvant immunotherapy tomorrow, I will postpone for a week due to his hospital admission. - Discharge per primary team.  I will see him as needed.   [name] [place], MD 02/06/2024

## 2024-02-06 NOTE — Progress Notes (Signed)
 Progress Note    Robert [name]   VWU:[id number]  DOB: 1950-12-23  DOA: 02/04/2024     2 PCP: Tresa Garter, MD  Initial CC: chills, positive culture  Hospital Course: Robert [name] is a 73 y.o. male with hx of multifocal high-grade urothelial carcinoma s/p surgery in Feb 2025. Has not yet started immunotherapy but completed chemo. Followed by Dr. Mosetta Putt.  He was seen recently in the ER on 01/30/2024 with fever and chills.  Urinalysis was concerning for infection and he was started on course of Keflex. Blood and urine cultures were obtained.  After turning positive, he was called to present back to the hospital for treatment and further workup.  Interval History:  No events overnight.  Seems to be doing okay this morning. Urine still draining appropriately in bag with no sediment or hematuria noted.  Assessment and Plan: * Bacteremia - 1/2 bottles positive for Serratia on 01/30/2024 -Repeat cultures drawn on 4/13 on admission; follow for clearance - continue cefepime for Serratia for now - ID consulted given underlying malignancy and recent surgery, now with stent placed per urology on 4/14 for hydronephrosis  - Watching repeat cultures for clearance; does have a PAC in place  UTI (urinary tract infection) - UA from 4/8 noted with large LE, neg nitrite, >50 WBC, many bacteria; urine culture growing Serratia and E faecalis -Continue cefepime and amoxicillin for now; ID also consulted  Hydronephrosis of left kidney - CT renal protocol performed on admission showing left-sided hydronephrosis with no focal calculus extending all the way to the level of the urostomy - Urology following; underwent stent placement on 02/05/2024  Bladder cancer [facility]) - s/p cystoprostatectomy + Rt nephroureterectomy + total urethrectomy + ICG sentinal / templante node dissection and left cutaneous ureterostomy 11/2023 for Stage [id number] urothelial cancer  - diagnosed initially July 2022 -  has been treated with neoadj chemo and now is recommended to start immunotherapy per oncology (nivolumab to start soon, planned 1 year course); recently seen in office on 01/25/24  Paroxysmal atrial fibrillation (HCC) - Hemoglobin relatively stable and no overt signs of bleeding - Continue Eliquis and Toprol  Type 2 diabetes mellitus without complication, without long-term current use of insulin (HCC) - Last A1c 5.9% on 10/05/2019 4 - diet controlled - Continue SSI and CBG monitoring  Essential hypertension - Continue amlodipine, Toprol  Hyperlipidemia - Continue Lipitor   Old records reviewed in assessment of this patient  Antimicrobials: Cefepime 02/04/2024 >> current Amoxicillin 02/04/2024 >> current  DVT prophylaxis:   apixaban (ELIQUIS) tablet 5 mg   Code Status:   Code Status: Full Code  Mobility Assessment (Last 72 Hours)     Mobility Assessment     Row Name 02/06/24 0800 02/05/24 2135 02/05/24 2133 02/05/24 1041 02/05/24 0014   Does patient have an order for bedrest or is patient medically unstable No - Continue assessment No - Continue assessment No - Continue assessment No - Continue assessment No - Continue assessment   What is the highest level of mobility based on the progressive mobility assessment? Level 6 (Walks independently in room and hall) - Balance while walking in room without assist - Complete Level 6 (Walks independently in room and hall) - Balance while walking in room without assist - Complete Level 6 (Walks independently in room and hall) - Balance while walking in room without assist - Complete Level 6 (Walks independently in room and hall) - Balance while walking in room without assist -  Complete Level 6 (Walks independently in room and hall) - Balance while walking in room without assist - Complete    Row Name 02/04/24 2135           Does patient have an order for bedrest or is patient medically unstable No - Continue assessment       What is the  highest level of mobility based on the progressive mobility assessment? Level 6 (Walks independently in room and hall) - Balance while walking in room without assist - Complete                Barriers to discharge: None Disposition Plan: Home HH orders placed: N/A Status is: Inpatient  Objective: Blood pressure 135/69, pulse 65, temperature 97.9 F (36.6 C), temperature source Oral, resp. rate 18, height 6\' 2"  (1.88 m), weight 93 kg, SpO2 96%.  Examination:  Physical Exam Constitutional:      General: He is not in acute distress.    Appearance: Normal appearance.  HENT:     Head: Normocephalic and atraumatic.     Mouth/Throat:     Mouth: Mucous membranes are moist.  Eyes:     Extraocular Movements: Extraocular movements intact.  Cardiovascular:     Rate and Rhythm: Normal rate and regular rhythm.  Pulmonary:     Effort: Pulmonary effort is normal. No respiratory distress.     Breath sounds: Normal breath sounds. No wheezing.  Abdominal:     General: Bowel sounds are normal. There is no distension.     Palpations: Abdomen is soft.     Tenderness: There is no abdominal tenderness.  Genitourinary:    Comments: Urostomy tube noted anterior abdomen with clear yellow urine Musculoskeletal:        General: Normal range of motion.     Cervical back: Normal range of motion and neck supple.  Skin:    General: Skin is warm and dry.  Neurological:     General: No focal deficit present.     Mental Status: He is alert.  Psychiatric:        Mood and Affect: Mood normal.        Behavior: Behavior normal.      Consultants:  Urology ID  Procedures:    Data Reviewed: Results for orders placed or performed during the hospital encounter of 02/04/24 (from the past 24 hours)  Glucose, capillary     Status: Abnormal   Collection Time: 02/05/24  5:02 PM  Result Value Ref Range   Glucose-Capillary 207 (H) 70 - 99 mg/dL  Glucose, capillary     Status: Abnormal   Collection  Time: 02/05/24  9:46 PM  Result Value Ref Range   Glucose-Capillary 259 (H) 70 - 99 mg/dL  Basic metabolic panel with GFR     Status: Abnormal   Collection Time: 02/06/24  6:30 AM  Result Value Ref Range   Sodium 138 135 - 145 mmol/L   Potassium 3.6 3.5 - 5.1 mmol/L   Chloride 107 98 - 111 mmol/L   CO2 23 22 - 32 mmol/L   Glucose, Bld 150 (H) 70 - 99 mg/dL   BUN 29 (H) 8 - 23 mg/dL   Creatinine, Ser 8.29 (H) 0.61 - 1.24 mg/dL   Calcium 8.2 (L) 8.9 - 10.3 mg/dL   GFR, Estimated 50 (L) >60 mL/min   Anion gap 8 5 - 15  CBC with Differential/Platelet     Status: Abnormal   Collection Time: 02/06/24  6:30 AM  Result Value Ref Range   WBC 7.0 4.0 - 10.5 K/uL   RBC 2.95 (L) 4.22 - 5.81 MIL/uL   Hemoglobin 8.5 (L) 13.0 - 17.0 g/dL   HCT 46.9 (L) 62.9 - 52.8 %   MCV 89.5 80.0 - 100.0 fL   MCH 28.8 26.0 - 34.0 pg   MCHC 32.2 30.0 - 36.0 g/dL   RDW 41.3 24.4 - 01.0 %   Platelets 164 150 - 400 K/uL   nRBC 0.0 0.0 - 0.2 %   Neutrophils Relative % 60 %   Neutro Abs 4.3 1.7 - 7.7 K/uL   Lymphocytes Relative 27 %   Lymphs Abs 1.9 0.7 - 4.0 K/uL   Monocytes Relative 9 %   Monocytes Absolute 0.6 0.1 - 1.0 K/uL   Eosinophils Relative 1 %   Eosinophils Absolute 0.1 0.0 - 0.5 K/uL   Basophils Relative 1 %   Basophils Absolute 0.1 0.0 - 0.1 K/uL   RBC Morphology MORPHOLOGY UNREMARKABLE    Smear Review Normal platelet morphology    Immature Granulocytes 2 %   Abs Immature Granulocytes 0.12 (H) 0.00 - 0.07 K/uL   Reactive, Benign Lymphocytes PRESENT   Magnesium     Status: None   Collection Time: 02/06/24  6:30 AM  Result Value Ref Range   Magnesium 2.0 1.7 - 2.4 mg/dL  Glucose, capillary     Status: Abnormal   Collection Time: 02/06/24  7:41 AM  Result Value Ref Range   Glucose-Capillary 151 (H) 70 - 99 mg/dL  Glucose, capillary     Status: Abnormal   Collection Time: 02/06/24 11:51 AM  Result Value Ref Range   Glucose-Capillary 210 (H) 70 - 99 mg/dL    I have reviewed pertinent  nursing notes, vitals, labs, and images as necessary. I have ordered labwork to follow up on as indicated.  I have reviewed the last notes from staff over past 24 hours. I have discussed patient's care plan and test results with nursing staff, CM/SW, and other staff as appropriate.  Time spent: Greater than 50% of the 55 minute visit was spent in counseling/coordination of care for the patient as laid out in the A&P.   LOS: 2 days   Faith Homes, MD [facility] 02/06/2024, 1:11 PM

## 2024-02-06 NOTE — Progress Notes (Signed)
 Mobility Specialist - Progress Note   02/06/24 0950  Mobility  Activity Ambulated with assistance in hallway  Level of Assistance Modified independent, requires aide device or extra time  Assistive Device Cane  Distance Ambulated (ft) 275 ft  Activity Response Tolerated well  Mobility Referral Yes  Mobility visit 1 Mobility  Mobility Specialist Start Time (ACUTE ONLY) 0941  Mobility Specialist Stop Time (ACUTE ONLY) 0948  Mobility Specialist Time Calculation (min) (ACUTE ONLY) 7 min   Pt received in bed and agreeable to mobility. No complaints during session. Pt to bed after session with all needs met.    [facility]

## 2024-02-07 ENCOUNTER — Other Ambulatory Visit: Payer: Self-pay

## 2024-02-07 ENCOUNTER — Other Ambulatory Visit: Payer: Self-pay | Admitting: *Deleted

## 2024-02-07 ENCOUNTER — Telehealth: Payer: Self-pay

## 2024-02-07 ENCOUNTER — Ambulatory Visit

## 2024-02-07 DIAGNOSIS — R7881 Bacteremia: Secondary | ICD-10-CM | POA: Diagnosis not present

## 2024-02-07 LAB — CBC WITH DIFFERENTIAL/PLATELET
Abs Immature Granulocytes: 0.2 10*3/uL — ABNORMAL HIGH (ref 0.00–0.07)
Basophils Absolute: 0.1 10*3/uL (ref 0.0–0.1)
Basophils Relative: 1 %
Eosinophils Absolute: 0.1 10*3/uL (ref 0.0–0.5)
Eosinophils Relative: 1 %
HCT: 26.8 % — ABNORMAL LOW (ref 39.0–52.0)
Hemoglobin: 8.8 g/dL — ABNORMAL LOW (ref 13.0–17.0)
Immature Granulocytes: 3 %
Lymphocytes Relative: 28 %
Lymphs Abs: 2.2 10*3/uL (ref 0.7–4.0)
MCH: 28.9 pg (ref 26.0–34.0)
MCHC: 32.8 g/dL (ref 30.0–36.0)
MCV: 87.9 fL (ref 80.0–100.0)
Monocytes Absolute: 0.6 10*3/uL (ref 0.1–1.0)
Monocytes Relative: 8 %
Neutro Abs: 4.6 10*3/uL (ref 1.7–7.7)
Neutrophils Relative %: 59 %
Platelets: 186 10*3/uL (ref 150–400)
RBC: 3.05 MIL/uL — ABNORMAL LOW (ref 4.22–5.81)
RDW: 13.6 % (ref 11.5–15.5)
WBC: 7.7 10*3/uL (ref 4.0–10.5)
nRBC: 0 % (ref 0.0–0.2)

## 2024-02-07 LAB — MAGNESIUM: Magnesium: 1.8 mg/dL (ref 1.7–2.4)

## 2024-02-07 LAB — GLUCOSE, CAPILLARY
Glucose-Capillary: 142 mg/dL — ABNORMAL HIGH (ref 70–99)
Glucose-Capillary: 293 mg/dL — ABNORMAL HIGH (ref 70–99)

## 2024-02-07 LAB — BASIC METABOLIC PANEL WITH GFR
Anion gap: 8 (ref 5–15)
BUN: 24 mg/dL — ABNORMAL HIGH (ref 8–23)
CO2: 22 mmol/L (ref 22–32)
Calcium: 8.4 mg/dL — ABNORMAL LOW (ref 8.9–10.3)
Chloride: 108 mmol/L (ref 98–111)
Creatinine, Ser: 1.44 mg/dL — ABNORMAL HIGH (ref 0.61–1.24)
GFR, Estimated: 51 mL/min — ABNORMAL LOW (ref >60)
Glucose, Bld: 158 mg/dL — ABNORMAL HIGH (ref 70–99)
Potassium: 3.6 mmol/L (ref 3.5–5.1)
Sodium: 138 mmol/L (ref 135–145)

## 2024-02-07 MED ORDER — AMOXICILLIN 500 MG PO CAPS: 500.0000 mg | ORAL_CAPSULE | Freq: Three times a day (TID) | ORAL | 0 refills | Status: AC

## 2024-02-07 MED ORDER — CIPROFLOXACIN HCL 750 MG PO TABS: 750.0000 mg | ORAL_TABLET | Freq: Two times a day (BID) | ORAL | 0 refills | Status: AC

## 2024-02-07 MED ORDER — HEPARIN SOD (PORK) LOCK FLUSH 100 UNIT/ML IV SOLN
500.0000 [IU] | INTRAVENOUS | Status: DC | PRN
  Filled 2024-02-07: qty 5

## 2024-02-07 MED ORDER — ATORVASTATIN CALCIUM 40 MG PO TABS: 40.0000 mg | ORAL_TABLET | Freq: Every day | ORAL | Status: AC

## 2024-02-07 MED ORDER — TADALAFIL 5 MG PO TABS: 5.0000 mg | ORAL_TABLET | Freq: Every day | ORAL | Status: DC | PRN

## 2024-02-07 NOTE — TOC Initial Note (Signed)
 Transition of Care [facility]) - Initial/Assessment Note    Patient Details  Name: Robert [name] MRN: [id number] Date of Birth: [birth year]  Transition of Care [facility]) CM/SW Contact:    Loreda Rodriguez, RN Phone Number:[phone]  02/07/2024, 2:23 PM  Clinical Narrative:                 Patient from home independent with no TOC needs.   Expected Discharge Plan: Home/Self Care Barriers to Discharge: No Barriers Identified   Patient Goals and CMS Choice Patient states their goals for this hospitalization and ongoing recovery are:: Ready to go home.          Expected Discharge Plan and Services In-house Referral: NA Discharge Planning Services: NA Post Acute Care Choice: NA Living arrangements for the past 2 months: Single Family Home Expected Discharge Date: 02/07/24               DME Arranged: N/A DME Agency: NA       HH Arranged: NA HH Agency: NA        Prior Living Arrangements/Services Living arrangements for the past 2 months: Single Family Home Lives with:: Self Patient language and need for interpreter reviewed:: Yes Do you feel safe going back to the place where you live?: Yes      Need for Family Participation in Patient Care: No (Comment) Care giver support system in place?: Yes (comment) Current home services: DME (cane) Criminal Activity/Legal Involvement Pertinent to Current Situation/Hospitalization: Yes - Comment as needed  Activities of Daily Living   ADL Screening (condition at time of admission) Independently performs ADLs?: Yes (appropriate for developmental age) Is the patient deaf or have difficulty hearing?: No Does the patient have difficulty seeing, even when wearing glasses/contacts?: No Does the patient have difficulty concentrating, remembering, or making decisions?: No  Permission Sought/Granted Permission sought to share information with : Family Supports Permission granted to share information with : No               Emotional Assessment Appearance:: Appears stated age Attitude/Demeanor/Rapport: Gracious Affect (typically observed): Quiet Orientation: : Oriented to Self, Oriented to Place, Oriented to  Time, Oriented to Situation Alcohol / Substance Use: Not Applicable Psych Involvement: No (comment)  Admission diagnosis:  Bacteremia [R78.81] Patient Active Problem List   Diagnosis Date Noted   Hydronephrosis of left kidney 02/05/2024   UTI (urinary tract infection) 02/05/2024   Bacteremia 02/04/2024   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 10/09/2023   Hypomagnesemia 07/03/2023   Port-A-Cath in place 06/09/2023   Elevated PSA measurement 02/28/2023   Preop examination 02/27/2023   Osteoarthritis of right hip 06/29/2022   Bladder cancer (HCC) 01/31/2022   Primary osteoarthritis of right hip 10/19/2021   Long term current use of anticoagulant -Eliquis 03/12/2019   Paroxysmal atrial fibrillation (HCC) 03/12/2019   Overweight (BMI 25.0-29.9) 01/03/2019   Type 2 diabetes mellitus without complication, without long-term current use of insulin (HCC) 07/23/2018   BPH associated with nocturia 03/01/2017   Irregular heart rate 06/23/2011   Hx of colonic polyps    ERECTILE DYSFUNCTION 12/19/2008   SKIN CANCER, HX OF 12/19/2008   Hyperlipidemia 08/21/2007   Essential hypertension 08/21/2007   PCP:  Genia Kettering, MD Pharmacy:   [id number] - [place], [place] - [address] [place] [address] Phone: [phone] Fax: [phone]     Social Drivers of Health (SDOH) Social History: SDOH Screenings   Food Insecurity: No Food Insecurity (02/05/2024)  Housing: Low  Risk  (02/05/2024)  Recent Concern: Housing - High Risk (12/07/2023)  Transportation Needs: No Transportation Needs (02/05/2024)  Utilities: Not At Risk (02/05/2024)  Alcohol Screen: Low Risk  (10/08/2023)  Depression (PHQ2-9): Low Risk  (10/09/2023)  Financial Resource Strain: Low Risk  (10/08/2023)  Physical  Activity: Insufficiently Active (10/08/2023)  Social Connections: Unknown (02/05/2024)  Stress: No Stress Concern Present (10/08/2023)  Tobacco Use: High Risk (02/04/2024)   SDOH Interventions:     Readmission Risk Interventions    02/07/2024    2:07 PM 12/10/2023    4:50 PM  Readmission Risk Prevention Plan  Transportation Screening Complete Complete  PCP or Specialist Appt within 5-7 Days  Complete  PCP or Specialist Appt within 3-5 Days Complete   Home Care Screening  Complete  Medication Review (RN CM)  Complete  HRI or Home Care Consult Complete   Social Work Consult for Recovery Care Planning/Counseling Complete   Palliative Care Screening Not Applicable   Medication Review Oceanographer) Referral to Pharmacy

## 2024-02-07 NOTE — Telephone Encounter (Signed)
 Patient called from [facility] requesting to have Dr. Gillian Lacrosse review his results and recommend whether or not he can be discharged. Advised him to discuss with his inpatient team and hospitalist.   Robert [name], BSN, RN

## 2024-02-07 NOTE — TOC Transition Note (Signed)
 Transition of Care [facility]) - Discharge Note   Patient Details  Name: Robert [name] MRN: [id number] Date of Birth: 10-24-1951  Transition of Care [facility]) CM/SW Contact:  Loreda Rodriguez, RN Phone [phone]  02/07/2024, 2:29 PM   Clinical Narrative:    Patient with discharge orders. No TOC needs.    Final next level of care: Home/Self Care Barriers to Discharge: No Barriers Identified   Patient Goals and CMS Choice Patient states their goals for this hospitalization and ongoing recovery are:: Ready to go home.          Discharge Placement                       Discharge Plan and Services Additional resources added to the After Visit Summary for   In-house Referral: NA Discharge Planning Services: NA Post Acute Care Choice: NA          DME Arranged: N/A DME Agency: NA       HH Arranged: NA HH Agency: NA        Social Drivers of Health (SDOH) Interventions SDOH Screenings   Food Insecurity: No Food Insecurity (02/05/2024)  Housing: Low Risk  (02/05/2024)  Recent Concern: Housing - High Risk (12/07/2023)  Transportation Needs: No Transportation Needs (02/05/2024)  Utilities: Not At Risk (02/05/2024)  Alcohol Screen: Low Risk  (10/08/2023)  Depression (PHQ2-9): Low Risk  (10/09/2023)  Financial Resource Strain: Low Risk  (10/08/2023)  Physical Activity: Insufficiently Active (10/08/2023)  Social Connections: Unknown (02/05/2024)  Stress: No Stress Concern Present (10/08/2023)  Tobacco Use: High Risk (02/04/2024)     Readmission Risk Interventions    02/07/2024    2:07 PM 12/10/2023    4:50 PM  Readmission Risk Prevention Plan  Transportation Screening Complete Complete  PCP or Specialist Appt within 5-7 Days  Complete  PCP or Specialist Appt within 3-5 Days Complete   Home Care Screening  Complete  Medication Review (RN CM)  Complete  HRI or Home Care Consult Complete   Social Work Consult for Recovery Care Planning/Counseling Complete    Palliative Care Screening Not Applicable   Medication Review Oceanographer) Referral to Pharmacy

## 2024-02-07 NOTE — Progress Notes (Signed)
 [facility] Liaison Note  02/07/2024  Robert [name] [birth year] [id number]  Location: RN Hospital Liaison screened the patient remotely at [facility].  Insurance: [employer] HMO   Robert [name] is a 73 y.o. male who is a Primary Care Patient of Plotnikov, Oakley Bellman, MD [facility] [place].The patient was screened for  readmission hospitalization with noted high risk score for unplanned readmission risk with 1 IP/4 ED in 6 months.  The patient was assessed for potential Care Management service needs for post hospital transition for care coordination. Review of patient's electronic medical record reveals patient was admitted for bacteremia. Pt discharged home with self care. Liaison spoke with pt and introduced VBCI services for post hospital prevention readmission follow up calls and ongoing management of care. Pt receptive to outreach transition of care/care management services. Liaison will make a referral for intervention.  Plan: [facility] Liaison will continue to follow progress and disposition to asess for post hospital community care coordination/management needs.  Referral request for community care coordination: Liaison will make a referral for care coordinator post hospital prevention readmission follow up calls   VBCI Care Management/Population Health does not replace or interfere with any arrangements made by the Inpatient Transition of Care team.   For questions contact:   Lilla Reichert, RN, BSN Hospital Liaison [place]   [facility], Population Health Office Hours MTWF  8:00 am-6:00 pm Direct Dial: [phone] mobile [email or web address]

## 2024-02-07 NOTE — Progress Notes (Signed)
 Mobility Specialist - Progress Note   02/07/24 1103  Mobility  Activity Ambulated with assistance in hallway  Level of Assistance Modified independent, requires aide device or extra time  Assistive Device Front wheel walker  Distance Ambulated (ft) 250 ft  Activity Response Tolerated well  Mobility Referral Yes  Mobility visit 1 Mobility  Mobility Specialist Start Time (ACUTE ONLY) 1054  Mobility Specialist Stop Time (ACUTE ONLY) 1102  Mobility Specialist Time Calculation (min) (ACUTE ONLY) 8 min   Pt received in recliner and agreeable to mobility. No complaints during session. Pt to recliner after session with all needs met.    [facility]

## 2024-02-07 NOTE — Discharge Summary (Signed)
 Physician Discharge Summary  Robert [name] HYQ:[id number] DOB: 05-15-1951 DOA: 02/04/2024  PCP: Genia Kettering, MD  Admit date: 02/04/2024 Discharge date: 02/07/2024  Admitted From: Home Disposition: Home  Recommendations for Outpatient Follow-up:  Follow up with PCP in 1 week with repeat CBC/BMP Outpatient follow-up with oncology and urology Outpatient follow-up with ID if needed Follow up in ED if symptoms worsen or new appear   Home Health: No Equipment/Devices: None  Discharge Condition: Guarded CODE STATUS: Full Diet recommendation: Heart healthy/carb modified  Brief/Interim Summary: 73 y.o. male with hx of multifocal high-grade urothelial carcinoma s/p neoadjuvant chemotherapy and surgery, paroxysmal A-fib, diabetes mellitus type 2, essential hypertension, hyperlipidemia has been recently seen on 01/30/2024 in the ED for fever and chills and was started on Keflex for possible UTI.  Subsequently blood cultures turned out positive and he was asked to come back to the hospital.  During the hospitalization, he was found to have Serratia bacteremia and was treated with IV antibiotics along with oral amoxicillin as per ID recommendations.  Oncology recommended outpatient follow-up with oncology.  He was also found to have hydronephrosis of left kidney for which he underwent bedside stent placement on 02/05/2024 by urology who recommended outpatient follow-up with urology.  Subsequently, his condition has improved.  He is not spiking any temperatures.  He will be discharged home today: ID has cleared him for discharge on oral amoxicillin and ciprofloxacin till 02/18/2024 (2 weeks from negative blood cultures).  Outpatient follow-up with PCP/oncology/urology.  Discharge Diagnoses:   Serratia marcescens bacteremia UTI: Present on admission -Treated with cefepime and amoxicillin as per ID.  Repeat blood cultures have been negative so far. -Subsequently, his condition has improved.   He is not spiking any temperatures.  He will be discharged home today: ID has cleared him for discharge on oral amoxicillin and ciprofloxacin till 02/18/2024 (2 weeks from negative blood cultures).    Left sided hydronephrosis -he underwent bedside stent placement on 02/05/2024 by urology who recommended outpatient follow-up with urology.    Bladder cancer status post neoadjuvant chemotherapy and surgery - Outpatient follow-up with oncology and urology.  He will be scheduled to start adjuvant immunotherapy as an outpatient as per oncology.  Paroxysmal A-fib - Currently rate controlled.  Continue Toprol-XL and Eliquis.  Outpatient follow-up with cardiology  Diabetes mellitus type 2--A1c 5.9 on 10/05/2023.  Continue carb modified diet.  Resume glipizide.  Hold metformin till reevaluation with PCP  Essential hypertension - Continue home regimen of amlodipine and metoprolol succinate  Hyperlipidemia - Continue statin    Discharge Instructions   Allergies as of 02/07/2024       Reactions   Lisinopril Swelling        Medication List     STOP taking these medications    cephALEXin 500 MG capsule Commonly known as: KEFLEX   dapagliflozin propanediol 10 MG Tabs tablet Commonly known as: Farxiga   empagliflozin 10 MG Tabs tablet Commonly known as: Jardiance   metFORMIN 500 MG 24 hr tablet Commonly known as: GLUCOPHAGE-XR   mupirocin ointment 2 % Commonly known as: BACTROBAN   oxyCODONE 5 MG immediate release tablet Commonly known as: Oxy IR/ROXICODONE   tamsulosin 0.4 MG Caps capsule Commonly known as: FLOMAX   traMADol 50 MG tablet Commonly known as: ULTRAM       TAKE these medications    amLODipine 5 MG tablet Commonly known as: NORVASC Take 1 tablet (5 mg total) by mouth daily.   apixaban 5 MG  Tabs tablet Commonly known as: Eliquis Take 1 tablet (5 mg total) by mouth 2 (two) times daily.   atorvastatin 40 MG tablet Commonly known as: LIPITOR Take 1  tablet (40 mg total) by mouth at bedtime.   B-complex with vitamin C tablet Take 1 tablet by mouth daily.   EPINEPHrine 0.3 mg/0.3 mL Soaj injection Commonly known as: EPI-PEN Inject 0.3 mg into the muscle as needed for anaphylaxis.   glipiZIDE 2.5 MG 24 hr tablet Commonly known as: GLUCOTROL XL Take 2.5 mg by mouth daily with breakfast.   glucose blood test strip 1 each by Other route daily. Use daily for glucose control - Dx 250.00   metoprolol succinate 25 MG 24 hr tablet Commonly known as: TOPROL-XL Take 1 tablet (25 mg total) by mouth daily.   Ozempic (1 MG/DOSE) 4 MG/3ML Sopn Generic drug: Semaglutide (1 MG/DOSE) INJECT 1 MG UNDER THE SKIN ONCE A WEEK AS DIRECTED   propranolol 10 MG tablet Commonly known as: INDERAL TAKE 1 TABLET BY MOUTH 4 TIMES DAILY AS NEEDED FOR PALPITATIONS, IRREGULAR OR FAST HEART RATE   tadalafil 5 MG tablet Commonly known as: Cialis Take 1 tablet (5 mg total) by mouth daily as needed for erectile dysfunction.   VITAMIN D-3 PO Take 1 tablet by mouth daily.        Follow-up Information     Plotnikov, Oakley Bellman, MD. Schedule an appointment as soon as possible for a visit in 1 week(s).   Specialty: Internal Medicine Contact information: [address] [place] [address] [phone]                Allergies  Allergen Reactions   Lisinopril Swelling    Consultations: Urology/oncology/ID   Procedures/Studies: DG Abd 1 View Result Date: 02/05/2024 CLINICAL DATA:  Left ureteral stent placement. History of bladder cancer with cystectomy and right nephrectomy. Left ureterostomy. EXAM: ABDOMEN - 1 VIEW COMPARISON:  Abdomen and pelvis CT dated 02/04/2024 FINDINGS: Normal bowel-gas pattern. Left ureteral stent with the proximal portion in the region of the left renal pelvis. The stent extends distally and loops back upon itself with the inferior portion of the stent located in the mid abdomen to the left of midline. This is  not the course of the ureter on the recent CT. Moderate lumbar spine degenerative changes. Marked right hip degenerative changes. IMPRESSION: Left ureteral stent with the proximal portion in the region of the left renal pelvis. The stent extends distally and loops back upon itself with the inferior portion of the stent located in the mid abdomen to the left of midline. This is not the course of the ureter on the recent CT. This needs to be correlated with the findings at recent stent placement. Electronically Signed   By: Catherin Closs M.D.   On: 02/05/2024 12:08   CT Renal Stone Study Result Date: 02/04/2024 CLINICAL DATA:  Left-sided flank pain for 1 week, initial encounter EXAM: CT ABDOMEN AND PELVIS WITHOUT CONTRAST TECHNIQUE: Multidetector CT imaging of the abdomen and pelvis was performed following the standard protocol without IV contrast. RADIATION DOSE REDUCTION: This exam was performed according to the departmental dose-optimization program which includes automated exposure control, adjustment of the mA and/or kV according to patient size and/or use of iterative reconstruction technique. COMPARISON:  09/01/2023 FINDINGS: Lower chest: No acute abnormality. Hepatobiliary: No focal liver abnormality is seen. No gallstones, gallbladder wall thickening, or biliary dilatation. Pancreas: Unremarkable. No pancreatic ductal dilatation or surrounding inflammatory changes. Spleen: Normal  in size without focal abnormality. Adrenals/Urinary Tract: Adrenal glands are mildly hyperplastic bilaterally. 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 No obstructing stone is noted. These changes are new from the prior exam. Bladder has been surgically removed. Stomach/Bowel: Scattered diverticular change of the colon is noted. No evidence of diverticulitis is seen. The appendix  is within normal limits. Stomach and small bowel are unremarkable. Vascular/Lymphatic: Aortic atherosclerosis. No enlarged abdominal or pelvic lymph nodes. Reproductive: Prostate has been surgically removed. Other: No abdominal wall hernia or abnormality. No abdominopelvic ascites. Musculoskeletal: Degenerative changes of the lumbar spine and right hip are seen. IMPRESSION: Left-sided hydronephrosis without focal calculus. This extends all the way to the level of the urostomy. Diverticulosis without diverticulitis. Postoperative changes consistent with cystoprostatectomy. Electronically Signed   By: Violeta Grey M.D.   On: 02/04/2024 19:23   DG Chest Port 1 View Result Date: 01/30/2024 CLINICAL DATA:  Possible sepsis shaking EXAM: PORTABLE CHEST 1 VIEW COMPARISON:  02/27/2023 FINDINGS: Right-sided central venous port tip over the SVC. No focal opacity, pleural effusion or pneumothorax. Normal cardiac size IMPRESSION: No active disease. Electronically Signed   By: Esmeralda Hedge M.D.   On: 01/30/2024 20:38      Subjective: Patient seen and examined at bedside.  Feels much better and feels okay to go home today.  Denies any fever, vomiting, chest pain or shortness of breath.  Discharge Exam: Vitals:   02/06/24 2200 02/07/24 0500  BP: (!) 142/76 (!) 146/76  Pulse: 69 69  Resp: 20 20  Temp: 98.5 F (36.9 C) 98.5 F (36.9 C)  SpO2: 97% 98%    General: Pt is alert, awake, not in acute distress.  Looks chronically ill and deconditioned. Cardiovascular: rate controlled, S1/S2 + Respiratory: bilateral decreased breath sounds at bases Abdominal: Soft, NT, ND, bowel sounds + Extremities: no edema, no cyanosis    The results of significant diagnostics from this hospitalization (including imaging, microbiology, ancillary and laboratory) are listed below for reference.     Microbiology: Recent Results (from the past 240 hours)  Resp panel by RT-PCR (RSV, Flu A&B, Covid) Anterior Nasal Swab      Status: None   Collection Time: 01/30/24  6:42 PM   Specimen: Anterior Nasal Swab  Result Value Ref Range Status   SARS Coronavirus 2 by RT PCR NEGATIVE NEGATIVE Final    Comment: (NOTE) SARS-CoV-2 target nucleic acids are NOT DETECTED.  The SARS-CoV-2 RNA is generally detectable in upper respiratory specimens during the acute phase of infection. The lowest concentration of SARS-CoV-2 viral copies this assay can detect is 138 copies/mL. A negative result does not preclude SARS-Cov-2 infection and should not be used as the sole basis for treatment or other patient management decisions. A negative result may occur with  improper specimen collection/handling, submission of specimen other than nasopharyngeal swab, presence of viral mutation(s) within the areas targeted by this assay, and inadequate number of viral copies(<138 copies/mL). A negative result must be combined with clinical observations, patient history, and epidemiological information. The expected result is Negative.  Fact Sheet for Patients:  BloggerCourse.com  Fact Sheet for Healthcare Providers:  SeriousBroker.it  This test is no t yet approved or cleared by the [place]  FDA and  has been authorized for detection and/or diagnosis of SARS-CoV-2 by FDA under an Emergency Use Authorization (EUA). This EUA will remain  in  effect (meaning this test can be used) for the duration of the COVID-19 declaration under Section 564(b)(1) of the Act, 21 U.S.C.section 360bbb-3(b)(1), unless the authorization is terminated  or revoked sooner.       Influenza A by PCR NEGATIVE NEGATIVE Final   Influenza B by PCR NEGATIVE NEGATIVE Final    Comment: (NOTE) The Xpert Xpress SARS-CoV-2/FLU/RSV plus assay is intended as an aid in the diagnosis of influenza from Nasopharyngeal swab specimens and should not be used as a sole basis for treatment. Nasal washings and aspirates are  unacceptable for Xpert Xpress SARS-CoV-2/FLU/RSV testing.  Fact Sheet for Patients: BloggerCourse.com  Fact Sheet for Healthcare Providers: SeriousBroker.it  This test is not yet approved or cleared by the [place] FDA and has been authorized for detection and/or diagnosis of SARS-CoV-2 by FDA under an Emergency Use Authorization (EUA). This EUA will remain in effect (meaning this test can be used) for the duration of the COVID-19 declaration under Section 564(b)(1) of the Act, 21 U.S.C. section 360bbb-3(b)(1), unless the authorization is terminated or revoked.     Resp Syncytial Virus by PCR NEGATIVE NEGATIVE Final    Comment: (NOTE) Fact Sheet for Patients: BloggerCourse.com  Fact Sheet for Healthcare Providers: SeriousBroker.it  This test is not yet approved or cleared by the [place] FDA and has been authorized for detection and/or diagnosis of SARS-CoV-2 by FDA under an Emergency Use Authorization (EUA). This EUA will remain in effect (meaning this test can be used) for the duration of the COVID-19 declaration under Section 564(b)(1) of the Act, 21 U.S.C. section 360bbb-3(b)(1), unless the authorization is terminated or revoked.  Performed at [facility], [address] [place], [place] [address]   Blood Culture (routine x 2)     Status: Abnormal   Collection Time: 01/30/24  7:08 PM   Specimen: BLOOD LEFT ARM  Result Value Ref Range Status   Specimen Description   Final    BLOOD LEFT ARM Performed at [facility], [address] [place], [place] [address]    Special Requests   Final    BOTTLES DRAWN AEROBIC AND ANAEROBIC Blood Culture results may not be optimal due to an inadequate volume of blood received in culture bottles Performed at [facility], [address], [place], [place] [address]    Culture  Setup  Time   Final    GRAM NEGATIVE RODS AEROBIC BOTTLE ONLY CRITICAL RESULT CALLED TO, READ BACK BY AND VERIFIED WITH: Bobby Rumpf  02/01/24 MK Performed at [facility], [address] [place], [place] [address]    Culture SERRATIA MARCESCENS (A)  Final   Report Status 02/03/2024 FINAL  Final   Organism ID, Bacteria SERRATIA MARCESCENS  Final      Susceptibility   Serratia marcescens - MIC*    CEFEPIME 0.5 SENSITIVE Sensitive     CEFTAZIDIME <=1 SENSITIVE Sensitive     CEFTRIAXONE <=0.25 SENSITIVE Sensitive     CIPROFLOXACIN <=0.25 SENSITIVE Sensitive     GENTAMICIN <=1 SENSITIVE Sensitive     TRIMETH/SULFA <=20 SENSITIVE Sensitive     * SERRATIA MARCESCENS  Blood Culture ID Panel (Reflexed)     Status: Abnormal   Collection Time: 01/30/24  7:08 PM  Result Value Ref Range Status   Enterococcus faecalis NOT DETECTED NOT DETECTED Final   Enterococcus Faecium NOT DETECTED NOT DETECTED Final   Listeria monocytogenes NOT DETECTED NOT DETECTED Final   Staphylococcus species NOT DETECTED NOT DETECTED  Final   Staphylococcus aureus (BCID) NOT DETECTED NOT DETECTED Final   Staphylococcus epidermidis NOT DETECTED NOT DETECTED Final   Staphylococcus lugdunensis NOT DETECTED NOT DETECTED Final   Streptococcus species NOT DETECTED NOT DETECTED Final   Streptococcus agalactiae NOT DETECTED NOT DETECTED Final   Streptococcus pneumoniae NOT DETECTED NOT DETECTED Final   Streptococcus pyogenes NOT DETECTED NOT DETECTED Final   A.calcoaceticus-baumannii NOT DETECTED NOT DETECTED Final   Bacteroides fragilis NOT DETECTED NOT DETECTED Final   Enterobacterales DETECTED (A) NOT DETECTED Final    Comment: Enterobacterales represent a large order of gram negative bacteria, not a single organism. CRITICAL RESULT CALLED TO, READ BACK BY AND VERIFIED WITH: M FOX,RN@[id number]  02/01/24 MK    Enterobacter cloacae complex NOT DETECTED NOT DETECTED Final   Escherichia coli NOT DETECTED NOT DETECTED Final    Klebsiella aerogenes NOT DETECTED NOT DETECTED Final   Klebsiella oxytoca NOT DETECTED NOT DETECTED Final   Klebsiella pneumoniae NOT DETECTED NOT DETECTED Final   Proteus species NOT DETECTED NOT DETECTED Final   Salmonella species NOT DETECTED NOT DETECTED Final   Serratia marcescens DETECTED (A) NOT DETECTED Final    Comment: CRITICAL RESULT CALLED TO, READ BACK BY AND VERIFIED WITH: M FOX,RN@[id number]  02/01/24 MK    Haemophilus influenzae NOT DETECTED NOT DETECTED Final   Neisseria meningitidis NOT DETECTED NOT DETECTED Final   Pseudomonas aeruginosa NOT DETECTED NOT DETECTED Final   Stenotrophomonas maltophilia NOT DETECTED NOT DETECTED Final   Candida albicans NOT DETECTED NOT DETECTED Final   Candida auris NOT DETECTED NOT DETECTED Final   Candida glabrata NOT DETECTED NOT DETECTED Final   Candida krusei NOT DETECTED NOT DETECTED Final   Candida parapsilosis NOT DETECTED NOT DETECTED Final   Candida tropicalis NOT DETECTED NOT DETECTED Final   Cryptococcus neoformans/gattii NOT DETECTED NOT DETECTED Final   CTX-M ESBL NOT DETECTED NOT DETECTED Final   Carbapenem resistance IMP NOT DETECTED NOT DETECTED Final   Carbapenem resistance KPC NOT DETECTED NOT DETECTED Final   Carbapenem resistance NDM NOT DETECTED NOT DETECTED Final   Carbapenem resist OXA 48 LIKE NOT DETECTED NOT DETECTED Final   Carbapenem resistance VIM NOT DETECTED NOT DETECTED Final    Comment: Performed at [facility], [address], [place], [place] [address]  Urine Culture     Status: Abnormal   Collection Time: 01/30/24  7:54 PM   Specimen: Urine, Random  Result Value Ref Range Status   Specimen Description   Final    URINE, RANDOM Performed at [facility], [address], [place], [place] [address]    Special Requests   Final    NONE Reflexed from [phone] Performed at [facility], [address], [place], [place] [address]    Culture (A)  Final    >=100,000  COLONIES/mL SERRATIA MARCESCENS >=100,000 COLONIES/mL ENTEROCOCCUS FAECALIS    Report Status 02/02/2024 FINAL  Final   Organism ID, Bacteria SERRATIA MARCESCENS (A)  Final   Organism ID, Bacteria ENTEROCOCCUS FAECALIS (A)  Final      Susceptibility   Enterococcus faecalis - MIC*    AMPICILLIN <=2 SENSITIVE Sensitive     NITROFURANTOIN <=16 SENSITIVE Sensitive     VANCOMYCIN 2 SENSITIVE Sensitive     * >=100,000 COLONIES/mL ENTEROCOCCUS FAECALIS   Serratia marcescens - MIC*    CEFEPIME 0.5 SENSITIVE Sensitive     CEFTRIAXONE 8 RESISTANT Resistant     CIPROFLOXACIN <=0.25 SENSITIVE Sensitive     GENTAMICIN <=1  SENSITIVE Sensitive     NITROFURANTOIN 256 RESISTANT Resistant     TRIMETH/SULFA <=20 SENSITIVE Sensitive     * >=100,000 COLONIES/mL SERRATIA MARCESCENS  Culture, blood (Routine X 2) w Reflex to ID Panel     Status: None (Preliminary result)   Collection Time: 02/04/24 11:56 PM   Specimen: BLOOD LEFT ARM  Result Value Ref Range Status   Specimen Description BLOOD LEFT ARM  Final   Special Requests   Final    BOTTLES DRAWN AEROBIC AND ANAEROBIC Blood Culture results may not be optimal due to an inadequate volume of blood received in culture bottles   Culture   Final    NO GROWTH 2 DAYS Performed at [facility], [address] [place], [place] [address]    Report Status PENDING  Incomplete  Culture, blood (Routine X 2) w Reflex to ID Panel     Status: None (Preliminary result)   Collection Time: 02/05/24  5:52 AM   Specimen: BLOOD LEFT ARM  Result Value Ref Range Status   Specimen Description   Final    BLOOD LEFT ARM Performed at [facility], [address], [place], [place] [address]    Special Requests   Final    BOTTLES DRAWN AEROBIC AND ANAEROBIC Blood Culture results may not be optimal due to an inadequate volume of blood received in culture bottles Performed at [facility], [address], [place], [place] [address]    Culture    Final    NO GROWTH 2 DAYS Performed at [facility], [address], [place], [place] [address]    Report Status PENDING  Incomplete     Labs: BNP (last 3 results) No results for input(s): "BNP" in the last 8760 hours. Basic Metabolic Panel: Recent Labs  Lab 02/04/24 1655 02/05/24 0552 02/06/24 0630 02/07/24 0545  NA 136 139 138 138  K 3.6 3.6 3.6 3.6  CL 104 107 107 108  CO2 21* 22 23 22   GLUCOSE 149* 99 150* 158*  BUN 36* 34* 29* 24*  CREATININE 1.91* 1.93* 1.46* 1.44*  CALCIUM 8.4* 8.5* 8.2* 8.4*  MG  --  2.1 2.0 1.8  PHOS  --  4.0  --   --    Liver Function Tests: No results for input(s): "AST", "ALT", "ALKPHOS", "BILITOT", "PROT", "ALBUMIN" in the last 168 hours. No results for input(s): "LIPASE", "AMYLASE" in the last 168 hours. No results for input(s): "AMMONIA" in the last 168 hours. CBC: Recent Labs  Lab 02/04/24 1655 02/05/24 0552 02/06/24 0630 02/07/24 0545  WBC 5.5 5.5 7.0 7.7  NEUTROABS 3.1  --  4.3 4.6  HGB 9.2* 8.9* 8.5* 8.8*  HCT 28.1* 27.3* 26.4* 26.8*  MCV 88.6 89.2 89.5 87.9  PLT 159 155 164 186   Cardiac Enzymes: No results for input(s): "CKTOTAL", "CKMB", "CKMBINDEX", "TROPONINI" in the last 168 hours. BNP: Invalid input(s): "POCBNP" CBG: Recent Labs  Lab 02/06/24 1151 02/06/24 1651 02/06/24 2157 02/07/24 0725 02/07/24 1122  GLUCAP 210* 198* 205* 142* 293*   D-Dimer No results for input(s): "DDIMER" in the last 72 hours. Hgb A1c No results for input(s): "HGBA1C" in the last 72 hours. Lipid Profile No results for input(s): "CHOL", "HDL", "LDLCALC", "TRIG", "CHOLHDL", "LDLDIRECT" in the last 72 hours. Thyroid function studies No results for input(s): "TSH", "T4TOTAL", "T3FREE", "THYROIDAB" in the last 72 hours.  Invalid input(s): "FREET3" Anemia work up No results for input(s): "VITAMINB12", "FOLATE", "FERRITIN", "TIBC", "IRON", "  RETICCTPCT" in the last 72 hours. Urinalysis    Component Value Date/Time   COLORURINE  YELLOW 01/30/2024 1954   APPEARANCEUR HAZY (A) 01/30/2024 1954   LABSPEC 1.009 01/30/2024 1954   PHURINE 5.0 01/30/2024 1954   GLUCOSEU NEGATIVE 01/30/2024 1954   GLUCOSEU 100 (A) 02/27/2023 1547   HGBUR MODERATE (A) 01/30/2024 1954   HGBUR negative 12/07/2009 0811   BILIRUBINUR NEGATIVE 01/30/2024 1954   BILIRUBINUR neg 04/29/2021 1443   KETONESUR NEGATIVE 01/30/2024 1954   PROTEINUR 30 (A) 01/30/2024 1954   UROBILINOGEN 0.2 02/27/2023 1547   NITRITE NEGATIVE 01/30/2024 1954   LEUKOCYTESUR LARGE (A) 01/30/2024 1954   Sepsis Labs Recent Labs  Lab 02/04/24 1655 02/05/24 0552 02/06/24 0630 02/07/24 0545  WBC 5.5 5.5 7.0 7.7   Microbiology Recent Results (from the past 240 hours)  Resp panel by RT-PCR (RSV, Flu A&B, Covid) Anterior Nasal Swab     Status: None   Collection Time: 01/30/24  6:42 PM   Specimen: Anterior Nasal Swab  Result Value Ref Range Status   SARS Coronavirus 2 by RT PCR NEGATIVE NEGATIVE Final    Comment: (NOTE) SARS-CoV-2 target nucleic acids are NOT DETECTED.  The SARS-CoV-2 RNA is generally detectable in upper respiratory specimens during the acute phase of infection. The lowest concentration of SARS-CoV-2 viral copies this assay can detect is 138 copies/mL. A negative result does not preclude SARS-Cov-2 infection and should not be used as the sole basis for treatment or other patient management decisions. A negative result may occur with  improper specimen collection/handling, submission of specimen other than nasopharyngeal swab, presence of viral mutation(s) within the areas targeted by this assay, and inadequate number of viral copies(<138 copies/mL). A negative result must be combined with clinical observations, patient history, and epidemiological information. The expected result is Negative.  Fact Sheet for Patients:  BloggerCourse.com  Fact Sheet for Healthcare Providers:   SeriousBroker.it  This test is no t yet approved or cleared by the [place]  FDA and  has been authorized for detection and/or diagnosis of SARS-CoV-2 by FDA under an Emergency Use Authorization (EUA). This EUA will remain  in effect (meaning this test can be used) for the duration of the COVID-19 declaration under Section 564(b)(1) of the Act, 21 U.S.C.section 360bbb-3(b)(1), unless the authorization is terminated  or revoked sooner.       Influenza A by PCR NEGATIVE NEGATIVE Final   Influenza B by PCR NEGATIVE NEGATIVE Final    Comment: (NOTE) The Xpert Xpress SARS-CoV-2/FLU/RSV plus assay is intended as an aid in the diagnosis of influenza from Nasopharyngeal swab specimens and should not be used as a sole basis for treatment. Nasal washings and aspirates are unacceptable for Xpert Xpress SARS-CoV-2/FLU/RSV testing.  Fact Sheet for Patients: BloggerCourse.com  Fact Sheet for Healthcare Providers: SeriousBroker.it  This test is not yet approved or cleared by the [place]  FDA and has been authorized for detection and/or diagnosis of SARS-CoV-2 by FDA under an Emergency Use Authorization (EUA). This EUA will remain in effect (meaning this test can be used) for the duration of the COVID-19 declaration under Section 564(b)(1) of the Act, 21 U.S.C. section 360bbb-3(b)(1), unless the authorization is terminated or revoked.     Resp Syncytial Virus by PCR NEGATIVE NEGATIVE Final    Comment: (NOTE) Fact Sheet for Patients: BloggerCourse.com  Fact Sheet for Healthcare Providers: SeriousBroker.it  This test is not yet approved or cleared by the [place]  FDA and has been authorized for detection and/or diagnosis  of SARS-CoV-2 by FDA under an Emergency Use Authorization (EUA). This EUA will remain in effect (meaning this test can be used) for  the duration of the COVID-19 declaration under Section 564(b)(1) of the Act, 21 U.S.C. section 360bbb-3(b)(1), unless the authorization is terminated or revoked.  Performed at [facility], [address] [place], [place] [address]   Blood Culture (routine x 2)     Status: Abnormal   Collection Time: 01/30/24  7:08 PM   Specimen: BLOOD LEFT ARM  Result Value Ref Range Status   Specimen Description   Final    BLOOD LEFT ARM Performed at [facility], [address], [place], [place] [address]    Special Requests   Final    BOTTLES DRAWN AEROBIC AND ANAEROBIC Blood Culture results may not be optimal due to an inadequate volume of blood received in culture bottles Performed at [facility], [address] [place], [place] [address]    Culture  Setup Time   Final    GRAM NEGATIVE RODS AEROBIC BOTTLE ONLY CRITICAL RESULT CALLED TO, READ BACK BY AND VERIFIED WITH: M FOX,RN@0704  02/01/24 MK Performed at [facility], [address], [place], [place] [address]    Culture SERRATIA MARCESCENS (A)  Final   Report Status 02/03/2024 FINAL  Final   Organism ID, Bacteria SERRATIA MARCESCENS  Final      Susceptibility   Serratia marcescens - MIC*    CEFEPIME 0.5 SENSITIVE Sensitive     CEFTAZIDIME <=1 SENSITIVE Sensitive     CEFTRIAXONE <=0.25 SENSITIVE Sensitive     CIPROFLOXACIN <=0.25 SENSITIVE Sensitive     GENTAMICIN <=1 SENSITIVE Sensitive     TRIMETH/SULFA <=20 SENSITIVE Sensitive     * SERRATIA MARCESCENS  Blood Culture ID Panel (Reflexed)     Status: Abnormal   Collection Time: 01/30/24  7:08 PM  Result Value Ref Range Status   Enterococcus faecalis NOT DETECTED NOT DETECTED Final   Enterococcus Faecium NOT DETECTED NOT DETECTED Final   Listeria monocytogenes NOT DETECTED NOT DETECTED Final   Staphylococcus species NOT DETECTED NOT DETECTED Final   Staphylococcus aureus (BCID) NOT DETECTED NOT DETECTED Final   Staphylococcus  epidermidis NOT DETECTED NOT DETECTED Final   Staphylococcus lugdunensis NOT DETECTED NOT DETECTED Final   Streptococcus species NOT DETECTED NOT DETECTED Final   Streptococcus agalactiae NOT DETECTED NOT DETECTED Final   Streptococcus pneumoniae NOT DETECTED NOT DETECTED Final   Streptococcus pyogenes NOT DETECTED NOT DETECTED Final   A.calcoaceticus-baumannii NOT DETECTED NOT DETECTED Final   Bacteroides fragilis NOT DETECTED NOT DETECTED Final   Enterobacterales DETECTED (A) NOT DETECTED Final    Comment: Enterobacterales represent a large order of gram negative bacteria, not a single organism. CRITICAL RESULT CALLED TO, READ BACK BY AND VERIFIED WITH: M FOX,RN@0704  02/01/24 MK    Enterobacter cloacae complex NOT DETECTED NOT DETECTED Final   Escherichia coli NOT DETECTED NOT DETECTED Final   Klebsiella aerogenes NOT DETECTED NOT DETECTED Final   Klebsiella oxytoca NOT DETECTED NOT DETECTED Final   Klebsiella pneumoniae NOT DETECTED NOT DETECTED Final   Proteus species NOT DETECTED NOT DETECTED Final   Salmonella species NOT DETECTED NOT DETECTED Final   Serratia marcescens DETECTED (A) NOT DETECTED Final    Comment: CRITICAL RESULT CALLED TO, READ BACK BY AND VERIFIED WITH: M FOX,RN@0704  02/01/24 MK    Haemophilus influenzae NOT DETECTED NOT DETECTED Final   Neisseria meningitidis NOT DETECTED NOT DETECTED Final  Pseudomonas aeruginosa NOT DETECTED NOT DETECTED Final   Stenotrophomonas maltophilia NOT DETECTED NOT DETECTED Final   Candida albicans NOT DETECTED NOT DETECTED Final   Candida auris NOT DETECTED NOT DETECTED Final   Candida glabrata NOT DETECTED NOT DETECTED Final   Candida krusei NOT DETECTED NOT DETECTED Final   Candida parapsilosis NOT DETECTED NOT DETECTED Final   Candida tropicalis NOT DETECTED NOT DETECTED Final   Cryptococcus neoformans/gattii NOT DETECTED NOT DETECTED Final   CTX-M ESBL NOT DETECTED NOT DETECTED Final   Carbapenem resistance IMP NOT  DETECTED NOT DETECTED Final   Carbapenem resistance KPC NOT DETECTED NOT DETECTED Final   Carbapenem resistance NDM NOT DETECTED NOT DETECTED Final   Carbapenem resist OXA 48 LIKE NOT DETECTED NOT DETECTED Final   Carbapenem resistance VIM NOT DETECTED NOT DETECTED Final    Comment: Performed at [facility], [address] [place], [place] [address]  Urine Culture     Status: Abnormal   Collection Time: 01/30/24  7:54 PM   Specimen: Urine, Random  Result Value Ref Range Status   Specimen Description   Final    URINE, RANDOM Performed at [facility], [address], [place], [place] [address]    Special Requests   Final    NONE Reflexed from [id number] Performed at [facility], [address] [place], [place] [address]    Culture (A)  Final    >=100,000 COLONIES/mL SERRATIA MARCESCENS >=100,000 COLONIES/mL ENTEROCOCCUS FAECALIS    Report Status 02/02/2024 FINAL  Final   Organism ID, Bacteria SERRATIA MARCESCENS (A)  Final   Organism ID, Bacteria ENTEROCOCCUS FAECALIS (A)  Final      Susceptibility   Enterococcus faecalis - MIC*    AMPICILLIN <=2 SENSITIVE Sensitive     NITROFURANTOIN <=16 SENSITIVE Sensitive     VANCOMYCIN 2 SENSITIVE Sensitive     * >=100,000 COLONIES/mL ENTEROCOCCUS FAECALIS   Serratia marcescens - MIC*    CEFEPIME 0.5 SENSITIVE Sensitive     CEFTRIAXONE 8 RESISTANT Resistant     CIPROFLOXACIN <=0.25 SENSITIVE Sensitive     GENTAMICIN <=1 SENSITIVE Sensitive     NITROFURANTOIN 256 RESISTANT Resistant     TRIMETH/SULFA <=20 SENSITIVE Sensitive     * >=100,000 COLONIES/mL SERRATIA MARCESCENS  Culture, blood (Routine X 2) w Reflex to ID Panel     Status: None (Preliminary result)   Collection Time: 02/04/24 11:56 PM   Specimen: BLOOD LEFT ARM  Result Value Ref Range Status   Specimen Description BLOOD LEFT ARM  Final   Special Requests   Final    BOTTLES DRAWN AEROBIC AND ANAEROBIC Blood Culture results may not  be optimal due to an inadequate volume of blood received in culture bottles   Culture   Final    NO GROWTH 2 DAYS Performed at [facility], [address], [place], [place] [address]    Report Status PENDING  Incomplete  Culture, blood (Routine X 2) w Reflex to ID Panel     Status: None (Preliminary result)   Collection Time: 02/05/24  5:52 AM   Specimen: BLOOD LEFT ARM  Result Value Ref Range Status   Specimen Description   Final    BLOOD LEFT ARM Performed at [facility], [address], [place], [place] [address]    Special Requests   Final    BOTTLES DRAWN AEROBIC AND ANAEROBIC Blood Culture results may not be optimal due to an inadequate volume  of blood received in culture bottles Performed at [facility], [address], [place], [place] [address]    Culture   Final    NO GROWTH 2 DAYS Performed at [facility], [address] [place], [place] [address]    Report Status PENDING  Incomplete     Time coordinating discharge: 35 minutes  SIGNED:   Audria Leather, MD  [facility] 02/07/2024, 12:16 PM

## 2024-02-07 NOTE — Progress Notes (Signed)
 ID brief note   Afebrile     Latest Ref Rng & Units 02/07/2024    5:45 AM 02/06/2024    6:30 AM 02/05/2024    5:52 AM  CBC  WBC 4.0 - 10.5 K/uL 7.7  7.0  5.5   Hemoglobin 13.0 - 17.0 g/dL 8.8  8.5  8.9   Hematocrit 39.0 - 52.0 % 26.8  26.4  27.3   Platelets 150 - 400 K/uL 186  164  155       Latest Ref Rng & Units 02/07/2024    5:45 AM 02/06/2024    6:30 AM 02/05/2024    5:52 AM  CMP  Glucose 70 - 99 mg/dL 409  811  99   BUN 8 - 23 mg/dL 24  29  34   Creatinine 0.61 - 1.24 mg/dL 9.14  7.82  9.56   Sodium 135 - 145 mmol/L 138  138  139   Potassium 3.5 - 5.1 mmol/L 3.6  3.6  3.6   Chloride 98 - 111 mmol/L 108  107  107   CO2 22 - 32 mmol/L 22  23  22    Calcium 8.9 - 10.3 mg/dL 8.4  8.2  8.5    Results for orders placed or performed during the hospital encounter of 02/04/24  Culture, blood (Routine X 2) w Reflex to ID Panel     Status: None (Preliminary result)   Collection Time: 02/04/24 11:56 PM   Specimen: BLOOD LEFT ARM  Result Value Ref Range Status   Specimen Description BLOOD LEFT ARM  Final   Special Requests   Final    BOTTLES DRAWN AEROBIC AND ANAEROBIC Blood Culture results may not be optimal due to an inadequate volume of blood received in culture bottles   Culture   Final    NO GROWTH 2 DAYS Performed at [facility], [address] [place], [place] [address]    Report Status PENDING  Incomplete  Culture, blood (Routine X 2) w Reflex to ID Panel     Status: None (Preliminary result)   Collection Time: 02/05/24  5:52 AM   Specimen: BLOOD LEFT ARM  Result Value Ref Range Status   Specimen Description   Final    BLOOD LEFT ARM Performed at [facility], [address] [place], [place] [address]    Special Requests   Final    BOTTLES DRAWN AEROBIC AND ANAEROBIC Blood Culture results may not be optimal due to an inadequate volume of blood received in culture bottles Performed at [facility], [address], [place], [place]  [address]    Culture   Final    NO GROWTH 2 DAYS Performed at [facility], [address] [place], [place] [address]    Report Status PENDING  Incomplete   Ok to switch to PO ciprofloxacin and PO amoxicillin for 2 weeks from negative blood cx. EOT 02/18/24   Fu appt with [facility] at 5/12 at 3: 45 for surveillance blood cultures due to presence of port  D/w ID pharm D and Primary team ID will so, recall back with questions or concerns  Odette Fraction, MD Infectious Disease Physician [facility] [address] [place], [place] [address] Phone: [phone]  Fax: [phone]

## 2024-02-07 NOTE — Plan of Care (Signed)
  Problem: Education: Goal: Knowledge of the procedure and recovery process will improve Outcome: Progressing   Problem: Bowel/Gastric: Goal: Gastrointestinal status for postoperative course will improve Outcome: Progressing   Problem: Skin Integrity: Goal: Demonstration of wound healing without infection will improve Outcome: Progressing   Problem: Urinary Elimination: Goal: Ability to avoid or minimize complications of infection will improve Outcome: Progressing Goal: Ability to achieve and maintain urine output will improve Outcome: Progressing Goal: Home care management will improve Outcome: Progressing   Problem: Education: Goal: Ability to describe self-care measures that may prevent or decrease complications (Diabetes Survival Skills Education) will improve Outcome: Progressing Goal: Individualized Educational Video(s) Outcome: Progressing   Problem: Coping: Goal: Ability to adjust to condition or change in health will improve Outcome: Progressing   Problem: Fluid Volume: Goal: Ability to maintain a balanced intake and output will improve Outcome: Progressing   Problem: Health Behavior/Discharge Planning: Goal: Ability to identify and utilize available resources and services will improve Outcome: Progressing Goal: Ability to manage health-related needs will improve Outcome: Progressing   Problem: Metabolic: Goal: Ability to maintain appropriate glucose levels will improve Outcome: Progressing   Problem: Nutritional: Goal: Maintenance of adequate nutrition will improve Outcome: Progressing Goal: Progress toward achieving an optimal weight will improve Outcome: Progressing   Problem: Skin Integrity: Goal: Risk for impaired skin integrity will decrease Outcome: Progressing   Problem: Tissue Perfusion: Goal: Adequacy of tissue perfusion will improve Outcome: Progressing   Problem: Education: Goal: Knowledge of General Education information will  improve Description: Including pain rating scale, medication(s)/side effects and non-pharmacologic comfort measures Outcome: Progressing   Problem: Health Behavior/Discharge Planning: Goal: Ability to manage health-related needs will improve Outcome: Progressing   Problem: Clinical Measurements: Goal: Ability to maintain clinical measurements within normal limits will improve Outcome: Progressing Goal: Will remain free from infection Outcome: Progressing Goal: Diagnostic test results will improve Outcome: Progressing Goal: Respiratory complications will improve Outcome: Progressing Goal: Cardiovascular complication will be avoided Outcome: Progressing   Problem: Activity: Goal: Risk for activity intolerance will decrease Outcome: Progressing   Problem: Nutrition: Goal: Adequate nutrition will be maintained Outcome: Progressing   Problem: Coping: Goal: Level of anxiety will decrease Outcome: Progressing   Problem: Elimination: Goal: Will not experience complications related to bowel motility Outcome: Progressing Goal: Will not experience complications related to urinary retention Outcome: Progressing   Problem: Pain Managment: Goal: General experience of comfort will improve and/or be controlled Outcome: Progressing   Problem: Safety: Goal: Ability to remain free from injury will improve Outcome: Progressing   Problem: Skin Integrity: Goal: Risk for impaired skin integrity will decrease Outcome: Progressing

## 2024-02-08 ENCOUNTER — Telehealth: Payer: Self-pay | Admitting: *Deleted

## 2024-02-08 DIAGNOSIS — Z936 Other artificial openings of urinary tract status: Secondary | ICD-10-CM | POA: Diagnosis not present

## 2024-02-08 DIAGNOSIS — Z8551 Personal history of malignant neoplasm of bladder: Secondary | ICD-10-CM | POA: Diagnosis not present

## 2024-02-08 NOTE — Transitions of Care (Post Inpatient/ED Visit) (Signed)
 02/08/2024  Name: Robert [name] MRN: [id number] DOB: 1950-10-31  Today's TOC FU Call Status: Today's TOC FU Call Status:: Successful TOC FU Call Completed TOC FU Call Complete Date: 02/08/24 Patient's Name and Date of Birth confirmed.  Transition Care Management Follow-up Telephone Call Date of Discharge: 02/07/24 Discharge Facility: [facility]) Type of Discharge: Inpatient Admission Primary Inpatient Discharge Diagnosis:: UTI; bacteremia How have you been since you were released from the hospital?: Better ("I am doing much better; no real issues or problems, taking the antibiotic like I am supposed to; I will look into the probiotics, that is a very good idea.") Any questions or concerns?: No  Items Reviewed: Did you receive and understand the discharge instructions provided?: Yes (thoroughly reviewed with patient who verbalizes good understanding of same) Medications obtained,verified, and reconciled?: Yes (Medications Reviewed) (Full medication reconciliation/ review completed; no concerns or discrepancies identified; confirmed patient obtained/ is taking all newly Rx'd medications as instructed; self-manages medications and denies questions/ concerns around medications today) Any new allergies since your discharge?: No Dietary orders reviewed?: Yes Type of Diet Ordered:: "Healthy as possible" Do you have support at home?: Yes People in Home [RPT]: spouse Name of Support/Comfort Primary Source: Reports independent in self-care activities; resides with spouse whom has dementia- he is spouse's primary caregiver; he has friends that are able to assists as/ if needed/ indicated in his own care  Medications Reviewed Today: Medications Reviewed Today     Reviewed by [name], RN (Registered Nurse) on 02/08/24 at 1545  Med List Status: <None>   Medication Order Taking? Sig Documenting Provider Last Dose Status Informant  amLODipine (NORVASC) 5 MG tablet [id number]  Yes Take 1 tablet (5 mg total) by mouth daily. Jacalyn Lefevre, MD Taking Active Self, Pharmacy Records           Med Note [place], [place] D   Tue Oct 03, 2023  3:22 PM)    amoxicillin (AMOXIL) 500 MG capsule [id number] Yes Take 1 capsule (500 mg total) by mouth 3 (three) times daily for 11 days. Odette Fraction, MD Taking Active   apixaban (ELIQUIS) 5 MG TABS tablet [id number] Yes Take 1 tablet (5 mg total) by mouth 2 (two) times daily. Nahser, Deloris Ping, MD Taking Active Self, Pharmacy Records  atorvastatin (LIPITOR) 40 MG tablet [id number] Yes Take 1 tablet (40 mg total) by mouth at bedtime. Glade Lloyd, MD Taking Active   B Complex-C (B-COMPLEX WITH VITAMIN C) tablet [id number] Yes Take 1 tablet by mouth daily. [provider] Taking Active Self, Pharmacy Records           Med Note [facility] [place], [place] A   Sun Feb 04, 2024  4:51 PM)    Cholecalciferol (VITAMIN D-3 PO) [id number] Yes Take 1 tablet by mouth daily. [provider] Taking Active Self, Pharmacy Records           Med Note [facility] [place], [place] A   Sun Feb 04, 2024  4:50 PM)    ciprofloxacin (CIPRO) 750 MG tablet [id number] Yes Take 1 tablet (750 mg total) by mouth 2 (two) times daily for 11 days. Odette Fraction, MD Taking Active   EPINEPHrine 0.3 mg/0.3 mL IJ SOAJ injection [id number] Yes Inject 0.3 mg into the muscle as needed for anaphylaxis. Netta Corrigan, PA-C Taking Active Self, Pharmacy Records  glipiZIDE (GLUCOTROL XL) 2.5 MG 24 hr tablet [id number] Yes Take 2.5 mg by mouth daily with breakfast. [provider] Taking Active Self, Pharmacy  Records  glucose blood test strip [id number] Yes 1 each by Other route daily. Use daily for glucose control - Dx 250.00 [name], [name], MD Taking Active Self, Pharmacy Records  metoprolol succinate (TOPROL-XL) 25 MG 24 hr tablet [id number] Yes Take 1 tablet (25 mg total) by mouth daily. Nahser, Lela Purple, MD Taking Active Self, Pharmacy Records   propranolol (INDERAL) 10 MG tablet [id number] Yes TAKE 1 TABLET BY MOUTH 4 TIMES DAILY AS NEEDED FOR PALPITATIONS, IRREGULAR OR FAST HEART RATE Nahser, Lela Purple, MD Taking Active Self, Pharmacy Records  Semaglutide, 1 MG/DOSE, (OZEMPIC, 1 MG/DOSE,) 4 MG/3ML Charisse Conception [id number] Yes INJECT 1 MG UNDER THE SKIN ONCE A WEEK AS DIRECTED Emilie Harden, MD Taking Active Self, Pharmacy Records           Med Note [facility] [place], [place] A   Sun Feb 04, 2024  4:53 PM) Saturdays  tadalafil (CIALIS) 5 MG tablet [id number] No Take 1 tablet (5 mg total) by mouth daily as needed for erectile dysfunction.  Patient not taking: Reported on 02/08/2024   Audria Leather, MD Not Taking Active            Home Care and Equipment/Supplies: Were Home Health Services Ordered?: No Any new equipment or medical supplies ordered?: No  Functional Questionnaire: Do you need assistance with bathing/showering or dressing?: No Do you need assistance with meal preparation?: No Do you need assistance with eating?: No Do you have difficulty maintaining continence: No Do you need assistance with getting out of bed/getting out of a chair/moving?: No Do you have difficulty managing or taking your medications?: No  Follow up appointments reviewed: PCP Follow-up appointment confirmed?: Yes (care coordination outreach in real-time with scheduling care guide to successfully schedule hospital follow up PCP appointment 02/14/23) Date of PCP follow-up appointment?: 02/14/24 Follow-up Provider: PCP- Covering provider Jovita Nipper NP Specialist Hospital Follow-up appointment confirmed?: Yes Date of Specialist follow-up appointment?: 02/15/24 Follow-Up Specialty Provider:: oncology provider Do you need transportation to your follow-up appointment?: No Do you understand care options if your condition(s) worsen?: Yes-patient verbalized understanding  SDOH Interventions Today    Flowsheet Row Most Recent Value  SDOH Interventions    Food Insecurity Interventions Intervention Not Indicated  Housing Interventions Intervention Not Indicated  Transportation Interventions Intervention Not Indicated  [friends and family provide transportation]  Utilities Interventions Intervention Not Indicated       Goals       Get healthy (pt-stated)      Stay Healthy.      Just recently diagnosed with bladder cancer and I hope everything turns around for me.  I start my treatment in a couple of weeks for 6  weeks.      [facility] Transitions of Care (TOC) Care Plan      Problems:  Recent Hospitalization for treatment of  UTI/ bacteremia No Hospital Follow Up Provider appointment successfully facilitated scheduling of hospital follow up office visit for 02/14/24  Goal:  Over the next 30 days, the patient will not experience hospital readmission  Interventions:  Transitions of Care: Durable Medical Equipment (DME) needs assessed with patient/caregiver Doctor Visits  - discussed the importance of doctor visits Communication with PCP re: TOC 30-day program enrollment Arranged PCP follow-up within 7 days (Care Guide Scheduled) Reviewed Signs and symptoms of infection  Patient Self Care Activities:  Attend all scheduled provider appointments Call provider office for new concerns or questions  Participate in Transition of Care Program/Attend TOC scheduled calls Take medications as prescribed  Use assistive devices as needed to prevent falls If you believe your condition is getting worse- contact your care providers (doctors) promptly- reaching out to your doctor early when you have concerns can prevent you from having to go to the hospital  Plan:  Telephone follow up appointment with care management team member scheduled for:  Friday 02/16/24  as scheduled  Plan for next week's call: Review medication adherence- ? Obtained probiotics Review provider office visits:  PCP- 02/14/24; oncology- 02/15/24; labs if available Reinforce/ provide  education re: signs/ symptoms UTI along with action plan        See TOC assessment tabs for additional assessment/ TOC intervention information  Total time spent from review to signing of note/ including any care coordination interventions:  57 minutes  Pls call/ message for questions,  Merced Brougham [name] Dailey Buccheri, RN, BSN, CCRN Alumnus RN Care Manager  Transitions of Care  [facility] - [facility] [phone]: direct office

## 2024-02-08 NOTE — Patient Instructions (Signed)
 Visit Information  Thank you for taking time to visit with me today. Please don't hesitate to contact me if I can be of assistance to you before our next scheduled telephone appointment.  Our next appointment is by telephone on Friday 12/19/23 at 11:00 am  Please call the care guide team at [phone] if you need to cancel or reschedule your appointment.   Patient Self Care Activities:  Attend all scheduled provider appointments Call provider office for new concerns or questions  Participate in Transition of Care Program/Attend TOC scheduled calls Take medications as prescribed   Use assistive devices as needed to prevent falls If you believe your condition is getting worse- contact your care providers (doctors) promptly- reaching out to your doctor early when you have concerns can prevent you from having to go to the hospital  Following is a copy of your care plan:   Goals Addressed             This Visit's Progress    [facility] Transitions of Care (TOC) Care Plan   On track    Problems:  Recent Hospitalization for treatment of  UTI/ bacteremia No Hospital Follow Up Provider appointment successfully facilitated scheduling of hospital follow up office visit for 02/14/24  Goal:  Over the next 30 days, the patient will not experience hospital readmission  Interventions:  Transitions of Care: Durable Medical Equipment (DME) needs assessed with patient/caregiver Doctor Visits  - discussed the importance of doctor visits Communication with PCP re: TOC 30-day program enrollment Arranged PCP follow-up within 7 days (Care Guide Scheduled) Reviewed Signs and symptoms of infection  Patient Self Care Activities:  Attend all scheduled provider appointments Call provider office for new concerns or questions  Participate in Transition of Care Program/Attend [facility] scheduled calls Take medications as prescribed   Use assistive devices as needed to prevent falls If you believe your condition is  getting worse- contact your care providers (doctors) promptly- reaching out to your doctor early when you have concerns can prevent you from having to go to the hospital  Plan:  Telephone follow up appointment with care management team member scheduled for:  Friday 02/16/24  as scheduled  Plan for next week's call: Review medication adherence- ? Obtained probiotics Review provider office visits:  PCP- 02/14/24; oncology- 02/15/24; labs if available Reinforce/ provide education re: signs/ symptoms UTI along with action plan        Patient verbalizes understanding of instructions and care plan provided today and agrees to view in MyChart. Active MyChart status and patient understanding of how to access instructions and care plan via MyChart confirmed with patient.     If you are experiencing a Mental Health or Behavioral Health Crisis or need someone to talk to, please  call the Suicide and Crisis Lifeline: 988 call the [place]  National Suicide Prevention Lifeline: 716 677 7159 or TTY: 380 553 6431 TTY (260)107-5628) to talk to a trained counselor call 1-800-273-TALK (toll free, 24 hour hotline) go to [facility] [address], [place] [phone]) call the [facility] Crisis Line: [phone] call 911   Erlene Hawks, RN, BSN, CCRN Alumnus RN Care Manager  Transitions of Care  [facility] - Population Health  [place] [phone]: direct office

## 2024-02-08 NOTE — Progress Notes (Signed)
 Pharmacist Chemotherapy Monitoring - Initial Assessment    Anticipated start date: 02/15/24   The following has been reviewed per standard work regarding the patient's treatment regimen: The patient's diagnosis, treatment plan and drug doses, and organ/hematologic function Lab orders and baseline tests specific to treatment regimen  The treatment plan start date, drug sequencing, and pre-medications Prior authorization status  Patient's documented medication list, including drug-drug interaction screen and prescriptions for anti-emetics and supportive care specific to the treatment regimen The drug concentrations, fluid compatibility, administration routes, and timing of the medications to be used The patient's access for treatment and lifetime cumulative dose history, if applicable  The patient's medication allergies and previous infusion related reactions, if applicable   Changes made to treatment plan:  N/A  Follow up needed:  Ok to proceed with tx while patient completes antibiotic course (~02/18/24) per Dr. Prentiss Brocks, PharmD, MBA

## 2024-02-09 ENCOUNTER — Encounter: Payer: Self-pay | Admitting: Internal Medicine

## 2024-02-10 LAB — CULTURE, BLOOD (ROUTINE X 2)
Culture: NO GROWTH
Culture: NO GROWTH

## 2024-02-12 ENCOUNTER — Encounter: Payer: Self-pay | Admitting: Internal Medicine

## 2024-02-13 ENCOUNTER — Encounter: Payer: Self-pay | Admitting: Hematology

## 2024-02-13 ENCOUNTER — Ambulatory Visit: Admitting: Internal Medicine

## 2024-02-13 ENCOUNTER — Encounter: Payer: Self-pay | Admitting: Internal Medicine

## 2024-02-13 VITALS — BP 130/70 | HR 84 | Ht 74.0 in | Wt 205.8 lb

## 2024-02-13 DIAGNOSIS — E785 Hyperlipidemia, unspecified: Secondary | ICD-10-CM

## 2024-02-13 DIAGNOSIS — E663 Overweight: Secondary | ICD-10-CM

## 2024-02-13 DIAGNOSIS — Z7984 Long term (current) use of oral hypoglycemic drugs: Secondary | ICD-10-CM

## 2024-02-13 DIAGNOSIS — E119 Type 2 diabetes mellitus without complications: Secondary | ICD-10-CM | POA: Diagnosis not present

## 2024-02-13 DIAGNOSIS — Z7985 Long-term (current) use of injectable non-insulin antidiabetic drugs: Secondary | ICD-10-CM | POA: Diagnosis not present

## 2024-02-13 LAB — POCT GLYCOSYLATED HEMOGLOBIN (HGB A1C): Hemoglobin A1C: 7.1 % — AB (ref 4.0–5.6)

## 2024-02-13 NOTE — Patient Instructions (Addendum)
 Please continue: - Glipizide  2.5 mg before breakfast  - Ozempic  1 mg weekly  Stop the snacks at night!  Please stop at the lab.  Please return in 4 months with your sugar log (before 06/11/2024).

## 2024-02-13 NOTE — Progress Notes (Signed)
 Patient ID: Robert [name], male   DOB: [birth year], 73 y.o.   MRN: [id number]   HPI: GREGOREY [name] II is a 73 y.o.-year-old male, returning for follow-up for  DM2, dx in 2000s, insulin -independent since 2018, uncontrolled, with complications (PN, ED).  Last visit 4 months ago.  Interim history: He has a history of bladder cancer and had transurethral resection on 06/18/2021.  The cancer was high-grade, but noninvasive. He was on BCG bladder instillations - off and on Eliquis .  He had a cancer recurrence before our visit in 11/2021. He was on Keytruda .  He had TURP 12/02/2022.  He completed chemotherapy.  At last visit he was preparing to have surgery to remove the bladder, prostate, and part of kidney. He had this 12/08/2023. He then developed a UTI and bacteriemia >> had IV ABx, then on po ABx - continuing. He will start Immunotherapy soon. He is the caregiver for his wife, who has dementia -has a lot of stress. No blurry vision, nausea, chest pain.   Reviewed HbA1c levels: Lab Results  Component Value Date   HGBA1C 5.9 (A) 10/05/2023   HGBA1C 6.5 (A) 06/12/2023   HGBA1C 6.7 (A) 02/03/2023   HGBA1C 6.2 (H) 11/25/2022   HGBA1C 6.5 (A) 08/01/2022   HGBA1C 6.7 (A) 03/28/2022   HGBA1C 7.6 (A) 12/06/2021   HGBA1C 6.7 (A) 08/03/2021   HGBA1C 6.5 (A) 01/28/2021   HGBA1C 6.4 (A) 07/30/2020  06/22/2023: HbA1c 6.5% (outside)  Pt is on a regimen of: - Trulicity  1.5 >> 3 mg weekly >> Ozempic  1 mg weekly - Metformin  Er 1000 mg 2x a day >> 2000 mg with dinner >> diarrhea >> 1000 mg 2x a day >> off for 1 week  - Glipizide  5 mg 2x a day >> 2.5 mg mg before b'fast We stopped Novolog  70/30 10 units 2x a day in 11/2017, when starting Trulicity .  Pt checks his sugars 1-2 times a day: - am: 125-167, 180 >> 120-140, 230s >> 135-154 (snacks at night) - 2h after b'fast:119-147, 157 > 126-155, 182 >> 136-171 >> n/c - before lunch:  135-142 >> 140-173, 239 >> n/c >> 99, 110 >> n/c - 2h after lunch:  160-179, 282 >> 172 >> 135 >> n/c >> 160-198, 210 - before dinner: 128, 132-159, 207 >> 147 >> 107-171 >> 112-128 >> n/c - 2h after dinner: 121-162, 188 >> n/c >> 110-123 >> 138, 146, 202 >> n/c - bedtime: 99-150s >> 103-135, 159 >> 88-123 >> n/a >> 104-112 - nighttime: 129 >> 91 (wine) >> 118 >> n/c Lowest sugar was 28 right after starting insulin  >> ... 88 >> 99 >> 102; he has hypoglycemia awareness in the 70s. Highest sugar was 280s >> 193 >> 202 >> 254 >> 210  Glucometer: ReliOn  Pt's meals are: - Breakfast: whole wheat toast + butte - Lunch: tuna salad or tuna sandwich ([employer] delivery), soft taco, meat + baked beans or potatoes - Dinner: [employer] delivery - Snacks: 1-0: nuts, bananas, other fruit He decreased his alcohol  intake. He was swimming 5 times a week before the coronavirus pandemic, now exercising at home.  -No CKD; last BUN/creatinine:  Lab Results  Component Value Date   BUN 24 (H) 02/07/2024   BUN 29 (H) 02/06/2024   CREATININE 1.44 (H) 02/07/2024   CREATININE 1.46 (H) 02/06/2024   Lab Results  Component Value Date   MICRALBCREAT 172 (H) 10/05/2023   MICRALBCREAT 8.4 01/28/2021   MICRALBCREAT 2.8 07/23/2018  MICRALBCREAT 1.8 03/01/2017   MICRALBCREAT 0.9 03/20/2015   MICRALBCREAT 0.8 02/10/2014   MICRALBCREAT 0.9 12/11/2012   MICRALBCREAT 1.8 12/20/2011   MICRALBCREAT 0.6 12/10/2010   MICRALBCREAT 4.4 12/07/2009  Off lisinopril  40 >> now stopped 2/2 angioedema.  He was in the emergency room with angioedema 11/20 and 21//2024. This was considered 2/2 Lisinopril . He was treated with injectable steroids.  He now has an EpiPen .  He had leg swelling before stopping lisinopril , but this resolved.  -+ HL; last set of lipids: Lab Results  Component Value Date   CHOL 116 02/27/2023   HDL 38.60 (L) 02/27/2023   LDLCALC 47 02/27/2023   LDLDIRECT 77.0 07/07/2015   TRIG 150.0 (H) 02/27/2023   CHOLHDL 3 02/27/2023  On Lipitor 40 and co-Q10.  - last eye  exam was in 2024: No DR, + cataract.   - + numbness but no tingling in his feet.  Last foot exam 06/12/2023.    He has a h/o arrhythmia (A fib) - wore an event monitor >> now on beta blocker. Cardiologist: Dr. Alroy Aspen. He has hip pain (OA) in the hip - he prefers to stand, walks with a cane.  He needs a hip replacement.  ROS: + See HPI  I reviewed pt's medications, allergies, PMH, social hx, family hx, and changes were documented in the history of present illness. Otherwise, unchanged from my initial visit note.  Past Medical History:  Diagnosis Date   Anticoagulant long-term use    eliquis --- managed by cardiology   Arthritis    Bladder cancer [facility])    urologist--- dr Derrick Fling;  overlapping   History of basal cell carcinoma (BCC) excision    History of COVID-19 05/2019   per pt mild symptoms that resolved   HLD (hyperlipidemia)    HTN (hypertension)    Hx of colonic polyps    Insulin  dependent type 2 diabetes mellitus [facility])    endocrinologist--- dr Aldona Amel --  (03-11-2022 per pt check blood sugar 1-2 times daily,  fasting sugar-- 118-120s)   Nocturia    Paroxysmal atrial fibrillation (HCC) 03/12/2019   cardiologist--- dr Floria Hurst   Ureterocele    Use of cane as ambulatory aid    Past Surgical History:  Procedure Laterality Date   BLEPHAROPLASTY Bilateral    per pt approx  2001;   upper eyelid's   COLONOSCOPY     last one approx 2023   CYSTOSCOPY W/ RETROGRADES N/A 12/08/2023   Procedure: CYSTOSCOPY;  Surgeon: Melody Spurling., MD;  Location: [facility];  Service: Urology;  Laterality: N/A;  390 MINUTES NEEDED FOR CASE   CYSTOSCOPY WITH URETEROSCOPY AND STENT PLACEMENT Right 12/02/2022   Procedure: CYSTOSCOPY WITH RIGHT URETEROSCOPY, RIGHT DIAGNOSTIC URETEROSCOPY;  Surgeon: Christina Coyer, MD;  Location: [facility];  Service: Urology;  Laterality: Right;  90 MINS FOR CASE   CYSTOSCOPY/RETROGRADE/URETEROSCOPY Bilateral 05/05/2023   Procedure: CYSTOSCOPY BILATERAL RETROGRADE  PYELOGRAM RIGHT URETEROSCOPY RIGHT STENT PLACEMENT;  Surgeon: Christina Coyer, MD;  Location: [facility];  Service: Urology;  Laterality: Bilateral;  90 MINS FOR CASE   IR IMAGING GUIDED PORT INSERTION  05/30/2023   PATELLAR TENDON REPAIR Left 04/16/2004   @[facility]    ROBOT ASSITED LAPAROSCOPIC NEPHROURETERECTOMY Right 12/08/2023   Procedure: RIGHT ROBOT ASSISTED Nephroureterectomy;  Surgeon: Melody Spurling., MD;  Location: [facility];  Service: Urology;  Laterality: Right;   ROBOT LAP RADICAL CYSTOPROSTATECTOMY PELVIC LYMPHADENECTOMY, NEOBLADDER  12/08/2023   Procedure: ROBOT ASSISTED LAPAROSCOPIC RADICAL CYSTOPROSTATECTOMY;  Surgeon: Secundino Dach,  Harvey Linen., MD;  Location: [facility];  Service: Urology;;   Elizbeth Gum N/A 12/08/2023   Procedure: TOTAL URETHRECTOMY,RADICAL;  Surgeon: Melody Spurling., MD;  Location: [facility];  Service: Urology;  Laterality: N/A;   TRANSURETHRAL RESECTION OF BLADDER TUMOR N/A 06/18/2021   Procedure: TRANSURETHRAL RESECTION OF BLADDER TUMOR (TURBT) WITH RIGHT URETERAL STENT PLACEMENT, RIGHT URETEROSCOPY WITH DISTRACTION OF TUMOR, FULGURATION;  Surgeon: Christina Coyer, MD;  Location: [facility];  Service: Urology;  Laterality: N/A;   TRANSURETHRAL RESECTION OF BLADDER TUMOR N/A 03/15/2022   Procedure: TRANSURETHRAL RESECTION OF BLADDER TUMOR (TURBT) WITH BILATERAL URETEROSCOPY/URETHRAL/ DILATION/BILATERAL RETROGRADE PYELOGRAM/BIOPSY AND FULGURATION OF RIGHT URETERAL CANCER/RIGHT STENT PLACEMENT;  Surgeon: Christina Coyer, MD;  Location: [facility];  Service: Urology;  Laterality: N/A;   TRANSURETHRAL RESECTION OF BLADDER TUMOR N/A 05/05/2023   Procedure: TRANSURETHRAL RESECTION OF BLADDER TUMOR (TURBT) BLADDER BIOSPY;  Surgeon: Christina Coyer, MD;  Location: [facility];  Service: Urology;  Laterality: N/A;   TRANSURETHRAL RESECTION OF BLADDER TUMOR WITH MITOMYCIN -C N/A 12/02/2022   Procedure:  TRANSURETHRAL RESECTION OF BLADDER TUMOR;  Surgeon: Christina Coyer, MD;  Location: [facility];  Service: Urology;  Laterality: N/A;   TRANSURETHRAL RESECTION OF PROSTATE  05/05/2023   Procedure: TRANSURETHRAL RESECTION OF THE PROSTATE (TURP);  Surgeon: Christina Coyer, MD;  Location: [facility];  Service: Urology;;   Social History   Socioeconomic History   Marital status: Married    Spouse name: Not on file   Number of children: 2  Social Needs  Occupational History   Occupation: DVD's   Occupation: Occupational hygienist: Self Employed, retired  Tobacco Use   Smoking status: Former Smoker    Types: Cigars    Last attempt to quit: 02/22/2011   Smokeless tobacco: Never Used   Tobacco comment: 2 cigars monthly or less  Substance and Sexual Activity   Alcohol  use: Yes     0.0 oz    Vodka, 2-3 drinks 3x a week   Drugs: No   Current Outpatient Medications on File Prior to Visit  Medication Sig Dispense Refill   amLODipine  (NORVASC ) 5 MG tablet Take 1 tablet (5 mg total) by mouth daily. 30 tablet 0   amoxicillin  (AMOXIL ) 500 MG capsule Take 1 capsule (500 mg total) by mouth 3 (three) times daily for 11 days. 33 capsule 0   apixaban  (ELIQUIS ) 5 MG TABS tablet Take 1 tablet (5 mg total) by mouth 2 (two) times daily. 180 tablet 1   atorvastatin  (LIPITOR) 40 MG tablet Take 1 tablet (40 mg total) by mouth at bedtime.     B Complex-C (B-COMPLEX WITH VITAMIN C) tablet Take 1 tablet by mouth daily.     Cholecalciferol (VITAMIN D-3 PO) Take 1 tablet by mouth daily.     ciprofloxacin  (CIPRO ) 750 MG tablet Take 1 tablet (750 mg total) by mouth 2 (two) times daily for 11 days. 22 tablet 0   EPINEPHrine  0.3 mg/0.3 mL IJ SOAJ injection Inject 0.3 mg into the muscle as needed for anaphylaxis. 1 each 0   glipiZIDE  (GLUCOTROL  XL) 2.5 MG 24 hr tablet Take 2.5 mg by mouth daily with breakfast.     glucose blood test strip 1 each by Other route daily. Use daily for glucose control -  Dx 250.00 100 each 0   metoprolol  succinate (TOPROL -XL) 25 MG 24 hr tablet Take 1 tablet (25 mg total) by mouth daily. 90 tablet 2  propranolol  (INDERAL ) 10 MG tablet TAKE 1 TABLET BY MOUTH 4 TIMES DAILY AS NEEDED FOR PALPITATIONS, IRREGULAR OR FAST HEART RATE 270 tablet 1   Semaglutide , 1 MG/DOSE, (OZEMPIC , 1 MG/DOSE,) 4 MG/3ML SOPN INJECT 1 MG UNDER THE SKIN ONCE A WEEK AS DIRECTED 9 mL 3   tadalafil  (CIALIS ) 5 MG tablet Take 1 tablet (5 mg total) by mouth daily as needed for erectile dysfunction. (Patient not taking: Reported on 02/08/2024)     No current facility-administered medications on file prior to visit.   Allergies  Allergen Reactions   Lisinopril  Swelling   Family History  Problem Relation Age of Onset   Pancreatic cancer Mother 2   Cancer Paternal Uncle        unknown type cancer   Aortic aneurysm Other        family history   Cancer Other        Breast (1st degree relative)   Colon cancer Neg Hx    Rectal cancer Neg Hx    Stomach cancer Neg Hx    Esophageal cancer Neg Hx    PE: BP 130/70   Pulse 84   Ht 6\' 2"  (1.88 m)   Wt 205 lb 12.8 oz (93.4 kg)   SpO2 98%   BMI 26.42 kg/m  Wt Readings from Last 12 Encounters:  02/13/24 205 lb 12.8 oz (93.4 kg)  02/05/24 205 lb 0.4 oz (93 kg)  01/30/24 205 lb 0.4 oz (93 kg)  01/25/24 206 lb 8 oz (93.7 kg)  12/08/23 197 lb 11.2 oz (89.7 kg)  10/09/23 209 lb (94.8 kg)  10/05/23 207 lb 12.8 oz (94.3 kg)  10/03/23 207 lb 3.2 oz (94 kg)  08/29/23 210 lb (95.3 kg)  08/10/23 209 lb 14.4 oz (95.2 kg)  07/27/23 206 lb 1.3 oz (93.5 kg)  07/20/23 206 lb 1.6 oz (93.5 kg)   Constitutional: overweight, in NAD Eyes:  EOMI, no exophthalmos ENT: no neck masses, no cervical lymphadenopathy Cardiovascular: RRR, No MRG Respiratory: CTA B Musculoskeletal: no deformities Skin:no rashes Neurological: no tremor with outstretched hands  ASSESSMENT: 1. DM2, insulin -independent, uncontrolled, with complications - PN - ED  2.  HL  3.  Overweight  PLAN:  1. Patient with longstanding, reasonably controlled type 2 diabetes, on oral antidiabetic regimen with metformin  and sulfonylurea and also weekly GLP-1 receptor agonist.  HbA1c at last visit was better, at 5.9% and sugars were mostly at goal, with only few hyperglycemic exceptions.  We did not change his regimen at that time but we discussed about possibly stopping glipizide  if blood sugars remain well-controlled.  However, we also checked a urine microalbumin to creatinine ratio at last visit and this returned slightly elevated.  At that point, I suggested an SGLT2 inhibitor, especially as he was off ACE inhibitor's due to previous angioedema with lisinopril .  He did not start this yet.  In the light of his current cancer and recent UTI with hematuria and sepsis, I would like to hold off starting an SGLT2 inhibitor for now.  However, we may try this in few months, but only if cleared by urology. - At today's visit, sugars are higher in the morning and they are still at goal for dinner.  He is not checking sugars consistently in the rest of the day.  He does mention that he is having snacks at night, which I advised him to stop.   -He is currently off metformin  for at least a week starting around the time of  his admission.  I believe that this is due to his decreased kidney function.  At today's visit we discussed about continuing with glipizide  and Ozempic  and will check another kidney function to see if we can start at least a half maximal dose of metformin  at night.  This will help with the blood sugars in the morning. - I suggested to:  Patient Instructions  Please continue: - Glipizide  2.5 mg before breakfast  - Ozempic  1 mg weekly  Stop the snacks at night!  Please stop at the lab.  Please return in 4 months with your sugar log (before 06/11/2024).   - we checked his HbA1c: 7.1% (higher) - advised to check sugars at different times of the day - 1x a day, rotating  check times - advised for yearly eye exams >> he is UTD - return to clinic in 3-4 months  2.  HL - Latest lipid panel was reviewed from 02/2023: Fractions at goal with the exception of a slightly low HDL, Lab Results  Component Value Date   CHOL 116 02/27/2023   HDL 38.60 (L) 02/27/2023   LDLCALC 47 02/27/2023   LDLDIRECT 77.0 07/07/2015   TRIG 150.0 (H) 02/27/2023   CHOLHDL 3 02/27/2023  -He continues Lipitor 40 mg daily and co-Q10 side effects   3.  Overweight -He lost approximately 26 pounds immediately after starting GLP-1 receptor agonist and after switching to a more plant-based diet - She gained a net 2 pounds before last visit and lost 2 more pounds since then.  Emilie Harden, MD PhD [facility] Endocrinology

## 2024-02-14 ENCOUNTER — Encounter: Payer: Self-pay | Admitting: Family Medicine

## 2024-02-14 ENCOUNTER — Ambulatory Visit (INDEPENDENT_AMBULATORY_CARE_PROVIDER_SITE_OTHER): Admitting: Family Medicine

## 2024-02-14 ENCOUNTER — Encounter: Payer: Self-pay | Admitting: Hematology

## 2024-02-14 ENCOUNTER — Other Ambulatory Visit: Payer: Self-pay

## 2024-02-14 ENCOUNTER — Encounter: Payer: Self-pay | Admitting: Internal Medicine

## 2024-02-14 VITALS — BP 132/72 | HR 67 | Temp 97.6°F | Ht 74.0 in | Wt 203.0 lb

## 2024-02-14 DIAGNOSIS — I10 Essential (primary) hypertension: Secondary | ICD-10-CM

## 2024-02-14 DIAGNOSIS — M1611 Unilateral primary osteoarthritis, right hip: Secondary | ICD-10-CM

## 2024-02-14 DIAGNOSIS — Z7985 Long-term (current) use of injectable non-insulin antidiabetic drugs: Secondary | ICD-10-CM

## 2024-02-14 DIAGNOSIS — R7881 Bacteremia: Secondary | ICD-10-CM

## 2024-02-14 DIAGNOSIS — E119 Type 2 diabetes mellitus without complications: Secondary | ICD-10-CM

## 2024-02-14 LAB — MICROALBUMIN / CREATININE URINE RATIO
Creatinine, Urine: 96 mg/dL (ref 20–320)
Microalb Creat Ratio: 431 mg/g{creat} — ABNORMAL HIGH (ref <30)
Microalb, Ur: 41.4 mg/dL

## 2024-02-14 LAB — BASIC METABOLIC PANEL WITH GFR
BUN/Creatinine Ratio: 23 (calc) — ABNORMAL HIGH (ref 6–22)
BUN: 35 mg/dL — ABNORMAL HIGH (ref 7–25)
CO2: 28 mmol/L (ref 20–32)
Calcium: 9.7 mg/dL (ref 8.6–10.3)
Chloride: 102 mmol/L (ref 98–110)
Creat: 1.54 mg/dL — ABNORMAL HIGH (ref 0.70–1.28)
Glucose, Bld: 187 mg/dL — ABNORMAL HIGH (ref 65–99)
Potassium: 4.6 mmol/L (ref 3.5–5.3)
Sodium: 139 mmol/L (ref 135–146)
eGFR: 47 mL/min/{1.73_m2} — ABNORMAL LOW (ref >=60)

## 2024-02-14 NOTE — Progress Notes (Signed)
 Subjective:     Patient ID: Robert [name], male    DOB: Apr 30, 1951, 73 y.o.   MRN: [id number]  Chief Complaint  Patient presents with   Hospitalization Follow-up    Doing better, no major problems.     HPI  History of Present Illness         He is here to follow up for recent hospitalization.   Admit date: 02/04/2024 Discharge date: 02/07/2024   Admitted From: Home Disposition: Home  PCP is Dr. Georgia Kipper. Here to see me today due to recent hospital discharge for UTI and bacteremia    Reports feeling fairly well and no fevers or new symptoms.   He is still taking antibiotics for serratia bacteremia. Treated with IV antibiotics along with oral amoxicillin  per ID.  Following with oncology outpatient for bladder cancer.  Followed by urology.   DM type 2- controlled. Holding metformin  until kidney function rebounds.   A-fib- rate controlled. Toprol  XL- and Eliquis . Followed by cardiology  HTN- on amlodipine  and Toprol - XL  HLD- on statin    Wife has dementia and he is her caretaker.    He used sail   Health Maintenance Due  Topic Date Due   DTaP/Tdap/Td (2 - Tdap) 08/20/2017   OPHTHALMOLOGY EXAM  12/25/2022   COVID-19 Vaccine (4 - 2024-25 season) 06/25/2023   Medicare Annual Wellness (AWV)  03/30/2024    Past Medical History:  Diagnosis Date   Anticoagulant long-term use    eliquis --- managed by cardiology   Arthritis    Bladder cancer [facility])    urologist--- dr Derrick Fling;  overlapping   Cataract    History of basal cell carcinoma (BCC) excision    History of COVID-19 05/2019   per pt mild symptoms that resolved   HLD (hyperlipidemia)    HTN (hypertension)    Hx of colonic polyps    Insulin  dependent type 2 diabetes mellitus [facility])    endocrinologist--- dr Aldona Amel --  (03-11-2022 per pt check blood sugar 1-2 times daily,  fasting sugar-- 118-120s)   Nocturia    Paroxysmal atrial fibrillation (HCC) 03/12/2019   cardiologist--- dr Floria Hurst    Ureterocele    Use of cane as ambulatory aid     Past Surgical History:  Procedure Laterality Date   BLEPHAROPLASTY Bilateral    per pt approx  2001;   upper eyelid's   COLONOSCOPY     last one approx 2023   CYSTOSCOPY W/ RETROGRADES N/A 12/08/2023   Procedure: CYSTOSCOPY;  Surgeon: Melody Spurling., MD;  Location: [facility];  Service: Urology;  Laterality: N/A;  390 MINUTES NEEDED FOR CASE   CYSTOSCOPY WITH URETEROSCOPY AND STENT PLACEMENT Right 12/02/2022   Procedure: CYSTOSCOPY WITH RIGHT URETEROSCOPY, RIGHT DIAGNOSTIC URETEROSCOPY;  Surgeon: Christina Coyer, MD;  Location: [facility];  Service: Urology;  Laterality: Right;  90 MINS FOR CASE   CYSTOSCOPY/RETROGRADE/URETEROSCOPY Bilateral 05/05/2023   Procedure: CYSTOSCOPY BILATERAL RETROGRADE PYELOGRAM RIGHT URETEROSCOPY RIGHT STENT PLACEMENT;  Surgeon: Christina Coyer, MD;  Location: [facility];  Service: Urology;  Laterality: Bilateral;  90 MINS FOR CASE   IR IMAGING GUIDED PORT INSERTION  05/30/2023   PATELLAR TENDON REPAIR Left 04/16/2004   @[facility]    ROBOT ASSITED LAPAROSCOPIC NEPHROURETERECTOMY Right 12/08/2023   Procedure: RIGHT ROBOT ASSISTED Nephroureterectomy;  Surgeon: Melody Spurling., MD;  Location: [facility];  Service: Urology;  Laterality: Right;   ROBOT LAP RADICAL CYSTOPROSTATECTOMY PELVIC LYMPHADENECTOMY, NEOBLADDER  12/08/2023   Procedure: ROBOT ASSISTED  LAPAROSCOPIC RADICAL CYSTOPROSTATECTOMY;  Surgeon: Melody Spurling., MD;  Location: [facility];  Service: Urology;;   TOTAL URETHRECTOMY,RADICAL N/A 12/08/2023   Procedure: TOTAL URETHRECTOMY,RADICAL;  Surgeon: Melody Spurling., MD;  Location: [facility];  Service: Urology;  Laterality: N/A;   TRANSURETHRAL RESECTION OF BLADDER TUMOR N/A 06/18/2021   Procedure: TRANSURETHRAL RESECTION OF BLADDER TUMOR (TURBT) WITH RIGHT URETERAL STENT PLACEMENT, RIGHT URETEROSCOPY WITH DISTRACTION OF TUMOR, FULGURATION;  Surgeon: Christina Coyer, MD;  Location: [facility];  Service: Urology;  Laterality: N/A;   TRANSURETHRAL RESECTION OF BLADDER TUMOR N/A 03/15/2022   Procedure: TRANSURETHRAL RESECTION OF BLADDER TUMOR (TURBT) WITH BILATERAL URETEROSCOPY/URETHRAL/ DILATION/BILATERAL RETROGRADE PYELOGRAM/BIOPSY AND FULGURATION OF RIGHT URETERAL CANCER/RIGHT STENT PLACEMENT;  Surgeon: Christina Coyer, MD;  Location: [facility];  Service: Urology;  Laterality: N/A;   TRANSURETHRAL RESECTION OF BLADDER TUMOR N/A 05/05/2023   Procedure: TRANSURETHRAL RESECTION OF BLADDER TUMOR (TURBT) BLADDER BIOSPY;  Surgeon: Christina Coyer, MD;  Location: [facility];  Service: Urology;  Laterality: N/A;   TRANSURETHRAL RESECTION OF BLADDER TUMOR WITH MITOMYCIN -C N/A 12/02/2022   Procedure: TRANSURETHRAL RESECTION OF BLADDER TUMOR;  Surgeon: Christina Coyer, MD;  Location: [facility];  Service: Urology;  Laterality: N/A;   TRANSURETHRAL RESECTION OF PROSTATE  05/05/2023   Procedure: TRANSURETHRAL RESECTION OF THE PROSTATE (TURP);  Surgeon: Christina Coyer, MD;  Location: [facility];  Service: Urology;;    Family History  Problem Relation Age of Onset   Pancreatic cancer Mother 49   Cancer Paternal Uncle        unknown type cancer   Aortic aneurysm Other        family history   Cancer Other        Breast (1st degree relative)   Colon cancer Neg Hx    Rectal cancer Neg Hx    Stomach cancer Neg Hx    Esophageal cancer Neg Hx     Social History   Socioeconomic History   Marital status: Married    Spouse name: Not on file   Number of children: 1   Years of education: Not on file   Highest education level: Some college, no degree  Occupational History   Occupation: DVD's   Occupation: Occupational hygienist: Self Employed  Tobacco Use   Smoking status: Some Days    Current packs/day: 0.00    Types: Cigars, Cigarettes    Last attempt to quit: 02/22/2011    Years since quitting: 12.9   Smokeless  tobacco: Never  Vaping Use   Vaping status: Never Used  Substance and Sexual Activity   Alcohol  use: Yes    Alcohol /week: 3.0 standard drinks of alcohol     Types: 3 Shots of liquor per week    Comment: 6-8oz /wk OCC MARTINI 2 TO 3 X WEEK   Drug use: Never   Sexual activity: Not Currently  Other Topics Concern   Not on file  Social History Narrative   Patient is married he has been employed in Nurse, children's   Former smoker with occasional alcohol  use   Social Drivers of Corporate investment banker Strain: Low Risk  (10/08/2023)   Overall Financial Resource Strain (CARDIA)    Difficulty of Paying Living Expenses: Not hard at all  Food Insecurity: No Food Insecurity (02/08/2024)   Hunger Vital Sign    Worried About Running Out of Food in the Last Year: Never true    Ran  Out of Food in the Last Year: Never true  Transportation Needs: No Transportation Needs (02/08/2024)   PRAPARE - Administrator, Civil Service (Medical): No    Lack of Transportation (Non-Medical): No  Physical Activity: Insufficiently Active (10/08/2023)   Exercise Vital Sign    Days of Exercise per Week: 2 days    Minutes of Exercise per Session: 20 min  Stress: No Stress Concern Present (10/08/2023)   [employer] of Occupational Health - Occupational Stress Questionnaire    Feeling of Stress : Only a little  Social Connections: Unknown (02/05/2024)   Social Connection and Isolation Panel [NHANES]    Frequency of Communication with Friends and Family: More than three times a week    Frequency of Social Gatherings with Friends and Family: More than three times a week    Attends Religious Services: More than 4 times per year    Active Member of [employer] or Organizations: Patient declined    Attends Banker Meetings: Patient declined    Marital Status: Married  Catering manager Violence: Not At Risk (02/08/2024)   Humiliation, Afraid, Rape, and Kick questionnaire    Fear of  Current or Ex-Partner: No    Emotionally Abused: No    Physically Abused: No    Sexually Abused: No    Outpatient Medications Prior to Visit  Medication Sig Dispense Refill   amLODipine  (NORVASC ) 5 MG tablet Take 1 tablet (5 mg total) by mouth daily. 30 tablet 0   amoxicillin  (AMOXIL ) 500 MG capsule Take 1 capsule (500 mg total) by mouth 3 (three) times daily for 11 days. 33 capsule 0   apixaban  (ELIQUIS ) 5 MG TABS tablet Take 1 tablet (5 mg total) by mouth 2 (two) times daily. 180 tablet 1   atorvastatin  (LIPITOR) 40 MG tablet Take 1 tablet (40 mg total) by mouth at bedtime.     B Complex-C (B-COMPLEX WITH VITAMIN C) tablet Take 1 tablet by mouth daily.     Cholecalciferol (VITAMIN D-3 PO) Take 1 tablet by mouth daily.     ciprofloxacin  (CIPRO ) 750 MG tablet Take 1 tablet (750 mg total) by mouth 2 (two) times daily for 11 days. 22 tablet 0   EPINEPHrine  0.3 mg/0.3 mL IJ SOAJ injection Inject 0.3 mg into the muscle as needed for anaphylaxis. 1 each 0   glipiZIDE  (GLUCOTROL  XL) 2.5 MG 24 hr tablet Take 2.5 mg by mouth daily with breakfast.     glucose blood test strip 1 each by Other route daily. Use daily for glucose control - Dx 250.00 100 each 0   metoprolol  succinate (TOPROL -XL) 25 MG 24 hr tablet Take 1 tablet (25 mg total) by mouth daily. 90 tablet 2   propranolol  (INDERAL ) 10 MG tablet TAKE 1 TABLET BY MOUTH 4 TIMES DAILY AS NEEDED FOR PALPITATIONS, IRREGULAR OR FAST HEART RATE 270 tablet 1   Semaglutide , 1 MG/DOSE, (OZEMPIC , 1 MG/DOSE,) 4 MG/3ML SOPN INJECT 1 MG UNDER THE SKIN ONCE A WEEK AS DIRECTED 9 mL 3   tadalafil  (CIALIS ) 5 MG tablet Take 1 tablet (5 mg total) by mouth daily as needed for erectile dysfunction.     No facility-administered medications prior to visit.    Allergies  Allergen Reactions   Lisinopril  Swelling    Review of Systems  Constitutional:  Positive for malaise/fatigue. Negative for chills and fever.  Respiratory:  Negative for shortness of breath.    Cardiovascular:  Negative for chest pain, palpitations and leg swelling.  Gastrointestinal:  Negative for abdominal pain, constipation, diarrhea, nausea and vomiting.  Genitourinary:  Negative for dysuria, frequency and urgency.  Neurological:  Negative for dizziness, weakness and headaches.       Objective:    Physical Exam Constitutional:      General: He is not in acute distress.    Appearance: He is not ill-appearing.  HENT:     Mouth/Throat:     Mouth: Mucous membranes are moist.  Eyes:     Extraocular Movements: Extraocular movements intact.     Conjunctiva/sclera: Conjunctivae normal.  Cardiovascular:     Rate and Rhythm: Normal rate and regular rhythm.  Pulmonary:     Effort: Pulmonary effort is normal.     Breath sounds: Normal breath sounds.  Musculoskeletal:     Cervical back: Normal range of motion and neck supple.     Right lower leg: No edema.     Left lower leg: No edema.  Skin:    General: Skin is warm and dry.  Neurological:     General: No focal deficit present.     Mental Status: He is alert and oriented to person, place, and time.     Motor: No weakness.     Coordination: Coordination normal.  Psychiatric:        Mood and Affect: Mood normal.        Behavior: Behavior normal.        Thought Content: Thought content normal.      BP 132/72 (BP Location: Left Arm, Patient Position: Sitting)   Pulse 67   Temp 97.6 F (36.4 C) (Temporal)   Ht 6\' 2"  (1.88 m)   Wt 203 lb (92.1 kg)   SpO2 99%   BMI 26.06 kg/m  Wt Readings from Last 3 Encounters:  02/15/24 205 lb 3.2 oz (93.1 kg)  02/14/24 203 lb (92.1 kg)  02/13/24 205 lb 12.8 oz (93.4 kg)       Assessment & Plan:   Problem List Items Addressed This Visit     Bacteremia - Primary   Essential hypertension   Osteoarthritis of right hip   Type 2 diabetes mellitus without complication, without long-term current use of insulin  (HCC)   Here today to see me for the first time for hospital  discharge follow up. His PCP is Dr. Georgia Kipper and he has a follow up appt scheduled 03/05/2024.  Reviewed hospital discharge summary, results and reviewed medications.  He has several specialists and has upcoming visits scheduled.  Recovering well from hospital admission.  No new concerns today.  Follow up with PCP as scheduled.    I am having Malena Scull II "[name]" maintain his glucose blood, propranolol , Cholecalciferol (VITAMIN D-3 PO), B-complex with vitamin C, Ozempic  (1 MG/DOSE), amLODipine , EPINEPHrine , metoprolol  succinate, glipiZIDE , apixaban , atorvastatin , tadalafil , amoxicillin , and ciprofloxacin .  No orders of the defined types were placed in this encounter.

## 2024-02-15 ENCOUNTER — Inpatient Hospital Stay: Admitting: Hematology

## 2024-02-15 ENCOUNTER — Inpatient Hospital Stay

## 2024-02-15 VITALS — BP 128/60 | HR 72 | Temp 98.4°F | Resp 21 | Ht 74.0 in | Wt 205.2 lb

## 2024-02-15 DIAGNOSIS — C61 Malignant neoplasm of prostate: Secondary | ICD-10-CM | POA: Diagnosis not present

## 2024-02-15 DIAGNOSIS — N189 Chronic kidney disease, unspecified: Secondary | ICD-10-CM | POA: Diagnosis not present

## 2024-02-15 DIAGNOSIS — C679 Malignant neoplasm of bladder, unspecified: Secondary | ICD-10-CM

## 2024-02-15 DIAGNOSIS — Z5112 Encounter for antineoplastic immunotherapy: Secondary | ICD-10-CM | POA: Diagnosis not present

## 2024-02-15 DIAGNOSIS — Z452 Encounter for adjustment and management of vascular access device: Secondary | ICD-10-CM | POA: Diagnosis not present

## 2024-02-15 DIAGNOSIS — Z79620 Long term (current) use of immunosuppressive biologic: Secondary | ICD-10-CM | POA: Diagnosis not present

## 2024-02-15 DIAGNOSIS — Z95828 Presence of other vascular implants and grafts: Secondary | ICD-10-CM

## 2024-02-15 DIAGNOSIS — C675 Malignant neoplasm of bladder neck: Secondary | ICD-10-CM

## 2024-02-15 LAB — CBC WITH DIFFERENTIAL (CANCER CENTER ONLY)
Abs Immature Granulocytes: 0.08 10*3/uL — ABNORMAL HIGH (ref 0.00–0.07)
Basophils Absolute: 0.1 10*3/uL (ref 0.0–0.1)
Basophils Relative: 1 %
Eosinophils Absolute: 0 10*3/uL (ref 0.0–0.5)
Eosinophils Relative: 1 %
HCT: 31.4 % — ABNORMAL LOW (ref 39.0–52.0)
Hemoglobin: 10.8 g/dL — ABNORMAL LOW (ref 13.0–17.0)
Immature Granulocytes: 1 %
Lymphocytes Relative: 18 %
Lymphs Abs: 1.6 10*3/uL (ref 0.7–4.0)
MCH: 28.7 pg (ref 26.0–34.0)
MCHC: 34.4 g/dL (ref 30.0–36.0)
MCV: 83.5 fL (ref 80.0–100.0)
Monocytes Absolute: 0.7 10*3/uL (ref 0.1–1.0)
Monocytes Relative: 8 %
Neutro Abs: 6.5 10*3/uL (ref 1.7–7.7)
Neutrophils Relative %: 71 %
Platelet Count: 205 10*3/uL (ref 150–400)
RBC: 3.76 MIL/uL — ABNORMAL LOW (ref 4.22–5.81)
RDW: 13.9 % (ref 11.5–15.5)
WBC Count: 8.9 10*3/uL (ref 4.0–10.5)
nRBC: 0 % (ref 0.0–0.2)

## 2024-02-15 LAB — CMP (CANCER CENTER ONLY)
ALT: 14 U/L (ref 0–44)
AST: 12 U/L — ABNORMAL LOW (ref 15–41)
Albumin: 4 g/dL (ref 3.5–5.0)
Alkaline Phosphatase: 83 U/L (ref 38–126)
Anion gap: 6 (ref 5–15)
BUN: 40 mg/dL — ABNORMAL HIGH (ref 8–23)
CO2: 26 mmol/L (ref 22–32)
Calcium: 9.5 mg/dL (ref 8.9–10.3)
Chloride: 105 mmol/L (ref 98–111)
Creatinine: 1.62 mg/dL — ABNORMAL HIGH (ref 0.61–1.24)
GFR, Estimated: 45 mL/min — ABNORMAL LOW (ref >60)
Glucose, Bld: 179 mg/dL — ABNORMAL HIGH (ref 70–99)
Potassium: 4.5 mmol/L (ref 3.5–5.1)
Sodium: 137 mmol/L (ref 135–145)
Total Bilirubin: 0.4 mg/dL (ref 0.0–1.2)
Total Protein: 7.2 g/dL (ref 6.5–8.1)

## 2024-02-15 LAB — TSH: TSH: 1.52 u[IU]/mL (ref 0.350–4.500)

## 2024-02-15 MED ORDER — SODIUM CHLORIDE 0.9% FLUSH
10.0000 mL | INTRAVENOUS | Status: AC | PRN
  Administered 2024-02-15: 10 mL

## 2024-02-15 MED ORDER — SODIUM CHLORIDE 0.9 % IV SOLN
480.0000 mg | Freq: Once | INTRAVENOUS | Status: AC
  Administered 2024-02-15: 480 mg via INTRAVENOUS
  Filled 2024-02-15: qty 48

## 2024-02-15 MED ORDER — SODIUM CHLORIDE 0.9 % IV SOLN: INTRAVENOUS | Status: DC

## 2024-02-15 NOTE — Progress Notes (Signed)
 [facility]   Telephone:([phone]) [phone] Fax:(336) [phone]   Clinic Follow up Note   Patient Care Team: Plotnikov, Oakley Bellman, MD as PCP - General (Internal Medicine) Nahser, Lela Purple, MD as PCP - Cardiology (Cardiology) Alver Austin, [facility] (Inactive) as Pharmacist (Pharmacist) Emilie Harden, MD as Consulting Physician (Internal Medicine) Christina Coyer, MD as Consulting Physician (Urology) Kenney Peacemaker, MD as Consulting Physician (Gastroenterology) Rudine Cos, MD as Consulting Physician (Ophthalmology) [name] [place]yston, MD as Attending Physician (Hematology and Oncology)  Date of Service:  02/15/2024  CHIEF COMPLAINT: f/u of bladder cancer   CURRENT THERAPY:  Adjuvant nivolumab   Oncology History   Bladder cancer [facility]) Bladder cancer (HCC) -cT2N0M0 stage II diagnosed in 04/2023 -He has had 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 -He is clinically doing well, asymptomatic.  Surgical resection was discussed with patient, but he would like to try nonsurgical options. -He started Keytruda  on 01/12/23, stopped in June 2024 due to disease progression. 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 -I recommended neoadjuvant chemotherapy with cisplatin  and gemcitabine  for 4 cycles, followed by total cystectomy and prostatectomy. -He started chemotherapy on June 08, 2023, and completed on 08/17/2023, overall tolerated well   -He underwent right robotic right nephroureterectomy, robotic radical cystectomy with total urethrectomy, bilateral pelvic lymph node dissection, and left cutaneous ureterostomy by Dr. Secundino Dach on 12/08/2023.. 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 Transitioning from Keytruda  to nivolumab , an immunotherapy with similar profile. Previously tolerated Keytruda  with minimal side effects, primarily mild fatigue. Kidney and liver functions are well-managed, with improved creatinine levels. Transition from infusion to injection for nivolumab  anticipated in the next month or two. Potential for fatigue as a side effect discussed, with instructions to report any side effects. - Administer nivolumab  infusion on May 20th. - Transition to nivolumab  injection when available. - Advise to report any side effects.  Chronic kidney disease, unspecified Chronic kidney disease, unspecified, likely secondary to previous kidney surgery. Elevated creatinine currently at 1.62, improved from 1.9. Advised to maintain adequate hydration and avoid NSAIDs to protect kidney function. - Encourage adequate hydration. - Advise against the use of NSAIDs, including ibuprofen.   Plan - He has recovered well from his recent hospital admission - Will proceed to first adjuvant nivolumab  today, and continue every 28 days.  -f/u in a month   SUMMARY OF ONCOLOGIC HISTORY: Oncology History Overview Note   Cancer Staging  Bladder cancer [facility]) Staging form: Urinary Bladder, AJCC 8th Edition - Clinical stage from 05/14/2021: Stage 0a (cTa, cN0, cM0) - Signed by [name] Cowden, MD on 01/02/2023 Stage prefix: Initial diagnosis WHO/ISUP grade (low/high): High Grade Histologic grading system: 2  grade system - Pathologic stage from 02/22/2022: Stage I (pT1, pN0, cM0) - Signed by [name] [place], MD on 01/02/2023 WHO/ISUP grade (low/high): High Grade Histologic  grading system: 2 grade system     Bladder cancer (HCC)  05/14/2021 Cancer Staging   Staging form: Urinary Bladder, AJCC 8th Edition - Clinical stage from 05/14/2021: Stage 0a (cTa, cN0, cM0) - Signed by [name] [place], MD on 01/02/2023 Stage prefix: Initial diagnosis WHO/ISUP grade (low/high): High Grade Histologic grading system: 2 grade system   01/31/2022 Initial Diagnosis   Bladder cancer (HCC)   02/22/2022 Cancer Staging   Staging form: Urinary Bladder, AJCC 8th Edition - Pathologic stage from 02/22/2022: Stage I (pT1, pN0, cM0) - Signed by [name] Breese, MD on 01/02/2023 WHO/ISUP grade (low/high): High Grade Histologic grading system: 2 grade system   01/12/2023 - 04/06/2023 Chemotherapy   Patient is on Treatment Plan : BLADDER Pembrolizumab  (200) q21d     05/05/2023 Pathology Results     FINAL MICROSCOPIC DIAGNOSIS:   A. BLADDER, RIGHT POSTERIOR, TURBT:  Noninvasive high grade papillary urothelial carcinoma  Muscularis propria (detrusor muscle) is not present   B. BLADDER NECK AND PROSTATIC URETHRA, BIOPSY:  High grade urothelial carcinoma  The carcinoma focally invades muscularis propria (detrusor muscle)     05/05/2023 Cancer Staging   Staging form: Urinary Bladder, AJCC 8th Edition - Clinical stage from 05/05/2023: Stage II (cT2, cN0, cM0) - Signed by [name] Stafford, MD on 06/15/2023 WHO/ISUP grade (low/high): High Grade Histologic grading system: 2 grade system   06/01/2023 Imaging   CT Chest, Abdomen, and Pelvis with contrast  IMPRESSION: 1. Similar irregular asymmetric wall thickening along the posterior urinary bladder with progressive irregular thickening of the right ureter extending to the level of the renal pelvis where there is now a masslike 16 x 12 mm soft tissue focus, suspicious for direct ureteral extension of  disease into the renal pelvis. 2. Irregular asymmetric wall thickening along the posterior urinary bladder measuring 14 mm is similar prior. 3. Right double-J ureteral stent is in place with persistent perinephric/periureteric stranding without significant hydronephrosis. 4. No convincing evidence of metastatic disease in the chest, abdomen or pelvis 5. Pancolonic diverticulosis without findings of acute diverticulitis. 6. Aortic Atherosclerosis (ICD10-I70.0) and Emphysema (ICD10-J43.9).   06/09/2023 - 08/17/2023 Chemotherapy   Patient is on Treatment Plan : BLADDER Cisplatin  D1 + Gemcitabine  D1,8 q21d x 4 Cycles     02/15/2024 -  Chemotherapy   Patient is on Treatment Plan : BLADDER Nivolumab  (480) q28d        Discussed the use of AI scribe software for clinical note transcription with the patient, who gave verbal consent to proceed.  History of Present Illness The patient, a 73 year old with a history of genitourinary (GU) cancer, presents for a follow-up visit. He reports feeling "okay" and his energy and appetite have returned to baseline. He recently completed a course of antibiotics for an infection, but did not feel particularly unwell during this time. He denies any fever or chills.  The patient has previously been on immunotherapy with Keytruda , which he tolerated well, only experiencing occasional fatigue. The patient is also aware that this medication will soon be available as an injection, rather than an infusion.  In addition to his cancer, the patient has a history of kidney disease, with one kidney removed surgically. His creatinine levels have been slightly elevated, but are improving. He is mindful of his hydration, consuming several 16-ounce bottles of water  daily. He avoids ibuprofen due to its potential renal side effects, but has taken Tylenol  for fever in the past.     All other systems were reviewed with the patient and  are negative.  MEDICAL HISTORY:  Past Medical  History:  Diagnosis Date   Anticoagulant long-term use    eliquis --- managed by cardiology   Arthritis    Bladder cancer [facility])    urologist--- dr Derrick Fling;  overlapping   Cataract    History of basal cell carcinoma (BCC) excision    History of COVID-19 05/2019   per pt mild symptoms that resolved   HLD (hyperlipidemia)    HTN (hypertension)    Hx of colonic polyps    Insulin  dependent type 2 diabetes mellitus [facility])    endocrinologist--- dr Aldona Amel --  (03-11-2022 per pt check blood sugar 1-2 times daily,  fasting sugar-- 118-120s)   Nocturia    Paroxysmal atrial fibrillation (HCC) 03/12/2019   cardiologist--- dr Floria Hurst   Ureterocele    Use of cane as ambulatory aid     SURGICAL HISTORY: Past Surgical History:  Procedure Laterality Date   BLEPHAROPLASTY Bilateral    per pt approx  2001;   upper eyelid's   COLONOSCOPY     last one approx 2023   CYSTOSCOPY W/ RETROGRADES N/A 12/08/2023   Procedure: CYSTOSCOPY;  Surgeon: Melody Spurling., MD;  Location: [facility];  Service: Urology;  Laterality: N/A;  390 MINUTES NEEDED FOR CASE   CYSTOSCOPY WITH URETEROSCOPY AND STENT PLACEMENT Right 12/02/2022   Procedure: CYSTOSCOPY WITH RIGHT URETEROSCOPY, RIGHT DIAGNOSTIC URETEROSCOPY;  Surgeon: Christina Coyer, MD;  Location: [facility];  Service: Urology;  Laterality: Right;  90 MINS FOR CASE   CYSTOSCOPY/RETROGRADE/URETEROSCOPY Bilateral 05/05/2023   Procedure: CYSTOSCOPY BILATERAL RETROGRADE PYELOGRAM RIGHT URETEROSCOPY RIGHT STENT PLACEMENT;  Surgeon: Christina Coyer, MD;  Location: [facility];  Service: Urology;  Laterality: Bilateral;  90 MINS FOR CASE   IR IMAGING GUIDED PORT INSERTION  05/30/2023   PATELLAR TENDON REPAIR Left 04/16/2004   @[facility]    ROBOT ASSITED LAPAROSCOPIC NEPHROURETERECTOMY Right 12/08/2023   Procedure: RIGHT ROBOT ASSISTED Nephroureterectomy;  Surgeon: Melody Spurling., MD;  Location: [facility];  Service: Urology;  Laterality: Right;   ROBOT LAP  RADICAL CYSTOPROSTATECTOMY PELVIC LYMPHADENECTOMY, NEOBLADDER  12/08/2023   Procedure: ROBOT ASSISTED LAPAROSCOPIC RADICAL CYSTOPROSTATECTOMY;  Surgeon: Melody Spurling., MD;  Location: [facility];  Service: Urology;;   TOTAL URETHRECTOMY,RADICAL N/A 12/08/2023   Procedure: TOTAL URETHRECTOMY,RADICAL;  Surgeon: Melody Spurling., MD;  Location: [facility];  Service: Urology;  Laterality: N/A;   TRANSURETHRAL RESECTION OF BLADDER TUMOR N/A 06/18/2021   Procedure: TRANSURETHRAL RESECTION OF BLADDER TUMOR (TURBT) WITH RIGHT URETERAL STENT PLACEMENT, RIGHT URETEROSCOPY WITH DISTRACTION OF TUMOR, FULGURATION;  Surgeon: Christina Coyer, MD;  Location: [facility];  Service: Urology;  Laterality: N/A;   TRANSURETHRAL RESECTION OF BLADDER TUMOR N/A 03/15/2022   Procedure: TRANSURETHRAL RESECTION OF BLADDER TUMOR (TURBT) WITH BILATERAL URETEROSCOPY/URETHRAL/ DILATION/BILATERAL RETROGRADE PYELOGRAM/BIOPSY AND FULGURATION OF RIGHT URETERAL CANCER/RIGHT STENT PLACEMENT;  Surgeon: Christina Coyer, MD;  Location: [facility];  Service: Urology;  Laterality: N/A;   TRANSURETHRAL RESECTION OF BLADDER TUMOR N/A 05/05/2023   Procedure: TRANSURETHRAL RESECTION OF BLADDER TUMOR (TURBT) BLADDER BIOSPY;  Surgeon: Christina Coyer, MD;  Location: [facility];  Service: Urology;  Laterality: N/A;   TRANSURETHRAL RESECTION OF BLADDER TUMOR WITH MITOMYCIN -C N/A 12/02/2022   Procedure: TRANSURETHRAL RESECTION OF BLADDER TUMOR;  Surgeon: Christina Coyer, MD;  Location: [facility];  Service: Urology;  Laterality: N/A;   TRANSURETHRAL RESECTION OF PROSTATE  05/05/2023   Procedure: TRANSURETHRAL RESECTION OF THE PROSTATE (TURP);  Surgeon: Christina Coyer, MD;  Location: [facility];  Service: Urology;;    I have reviewed the social history and family history with the patient and they are unchanged from previous note.  ALLERGIES:  is allergic to  lisinopril .  MEDICATIONS:  Current Outpatient Medications  Medication Sig Dispense Refill   amLODipine  (NORVASC ) 5 MG tablet Take 1 tablet (5 mg total) by mouth daily. 30 tablet 0   amoxicillin  (AMOXIL ) 500 MG capsule Take 1 capsule (500 mg total) by mouth 3 (three) times daily for 11 days. 33 capsule 0   apixaban  (ELIQUIS ) 5 MG TABS tablet Take 1 tablet (5 mg total) by mouth 2 (two) times daily. 180 tablet 1   atorvastatin  (LIPITOR) 40 MG tablet Take 1 tablet (40 mg total) by mouth at bedtime.     B Complex-C (B-COMPLEX WITH VITAMIN C) tablet Take 1 tablet by mouth daily.     Cholecalciferol (VITAMIN D-3 PO) Take 1 tablet by mouth daily.     ciprofloxacin  (CIPRO ) 750 MG tablet Take 1 tablet (750 mg total) by mouth 2 (two) times daily for 11 days. 22 tablet 0   EPINEPHrine  0.3 mg/0.3 mL IJ SOAJ injection Inject 0.3 mg into the muscle as needed for anaphylaxis. 1 each 0   glipiZIDE  (GLUCOTROL  XL) 2.5 MG 24 hr tablet Take 2.5 mg by mouth daily with breakfast.     glucose blood test strip 1 each by Other route daily. Use daily for glucose control - Dx 250.00 100 each 0   metoprolol  succinate (TOPROL -XL) 25 MG 24 hr tablet Take 1 tablet (25 mg total) by mouth daily. 90 tablet 2   propranolol  (INDERAL ) 10 MG tablet TAKE 1 TABLET BY MOUTH 4 TIMES DAILY AS NEEDED FOR PALPITATIONS, IRREGULAR OR FAST HEART RATE 270 tablet 1   Semaglutide , 1 MG/DOSE, (OZEMPIC , 1 MG/DOSE,) 4 MG/3ML SOPN INJECT 1 MG UNDER THE SKIN ONCE A WEEK AS DIRECTED 9 mL 3   tadalafil  (CIALIS ) 5 MG tablet Take 1 tablet (5 mg total) by mouth daily as needed for erectile dysfunction.     No current facility-administered medications for this visit.   Facility-Administered Medications Ordered in Other Visits  Medication Dose Route Frequency Provider Last Rate Last Admin   0.9 %  sodium chloride  infusion   Intravenous Continuous [name] [place], MD   Stopped at 02/15/24 1616   sodium chloride  flush (NS) 0.9 % injection 10 mL  10 mL  Intracatheter PRN [name] [place], MD   10 mL at 02/15/24 1353    PHYSICAL EXAMINATION: ECOG PERFORMANCE STATUS: 1 - Symptomatic but completely ambulatory  Vitals:   02/15/24 1429  BP: 128/60  Pulse: 72  Resp: (!) 21  Temp: 98.4 F (36.9 C)  SpO2: 99%   Wt Readings from Last 3 Encounters:  02/15/24 205 lb 3.2 oz (93.1 kg)  02/14/24 203 lb (92.1 kg)  02/13/24 205 lb 12.8 oz (93.4 kg)    GENERAL:alert, no distress and comfortable SKIN: skin color, texture, turgor are normal, no rashes or significant lesions EYES: normal, Conjunctiva are pink and non-injected, sclera clear NECK: supple, thyroid  normal size, non-tender, without nodularity LYMPH:  no palpable lymphadenopathy in the cervical, axillary  LUNGS: clear to auscultation and percussion with normal breathing effort HEART: regular rate & rhythm and no murmurs and no lower extremity edema ABDOMEN:abdomen soft, non-tender and normal bowel sounds Musculoskeletal:no cyanosis of digits and no clubbing  NEURO: alert & oriented x 3 with fluent speech, no focal motor/sensory deficits  Physical Exam  LABORATORY DATA:  I have reviewed the data as listed    Latest Ref Rng & Units 02/15/2024    1:51 PM 02/07/2024    5:45 AM 02/06/2024    6:30 AM  CBC  WBC 4.0 - 10.5 K/uL 8.9  7.7  7.0   Hemoglobin 13.0 - 17.0 g/dL 36.6  8.8  8.5   Hematocrit 39.0 - 52.0 % 31.4  26.8  26.4   Platelets 150 - 400 K/uL 205  186  164         Latest Ref Rng & Units 02/15/2024    1:51 PM 02/13/2024    2:44 PM 02/07/2024    5:45 AM  CMP  Glucose 70 - 99 mg/dL 440  347  425   BUN 8 - 23 mg/dL 40  35  24   Creatinine 0.61 - 1.24 mg/dL 9.56  3.87  5.64   Sodium 135 - 145 mmol/L 137  139  138   Potassium 3.5 - 5.1 mmol/L 4.5  4.6  3.6   Chloride 98 - 111 mmol/L 105  102  108   CO2 22 - 32 mmol/L 26  28  22    Calcium  8.9 - 10.3 mg/dL 9.5  9.7  8.4   Total Protein 6.5 - 8.1 g/dL 7.2     Total Bilirubin 0.0 - 1.2 mg/dL 0.4     Alkaline Phos 38 - 126  U/L 83     AST 15 - 41 U/L 12     ALT 0 - 44 U/L 14         RADIOGRAPHIC STUDIES: I have personally reviewed the radiological images as listed and agreed with the findings in the report. No results found.    No orders of the defined types were placed in this encounter.  All questions were answered. The patient knows to call the clinic with any problems, questions or concerns. No barriers to learning was detected. The total time spent in the appointment was 25 minutes.     [name] [place], MD 02/15/2024

## 2024-02-15 NOTE — Assessment & Plan Note (Addendum)
 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 -He is clinically doing well, asymptomatic.  Surgical resection was discussed with patient, but he would like to try nonsurgical options. -He started Keytruda  on 01/12/23, stopped in June 2024 due to disease progression. 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 -I recommended neoadjuvant chemotherapy with cisplatin  and gemcitabine  for 4 cycles, followed by total cystectomy and prostatectomy. -He started chemotherapy on June 08, 2023, and completed on 08/17/2023, overall tolerated well  -He underwent right robotic right nephroureterectomy, robotic radical cystectomy with total urethrectomy, bilateral pelvic lymph node dissection, and left cutaneous ureterostomy by Dr. Secundino Dach on 12/08/2023.. surgical path showed multifocal high-grade urothelial carcinoma, right renal pelvis, ureter, bladder, and prostatic urethra, surgical margines negative, 1 positive node  -He started adjuvant Nivo on 02/15/2024

## 2024-02-16 ENCOUNTER — Other Ambulatory Visit: Payer: Self-pay | Admitting: *Deleted

## 2024-02-16 LAB — T4: T4, Total: 8.1 ug/dL (ref 4.5–12.0)

## 2024-02-16 NOTE — Transitions of Care (Post Inpatient/ED Visit) (Signed)
 Transition of Care week 2/ day # 8  Visit Note  02/16/2024  Name: Robert [name] MRN: [id number]          DOB: 1951-01-13  Situation: Patient enrolled in [facility] 30-day program. Visit completed with patient by telephone.   HIPAA identifiers x 2 verified  Background:  Recent hospital visit April 13-16, 2025 for fever/ chills- UTI bacteremia/ hydronephrosis with placement of (L) ureteral stent in setting of baseline bladder CA with urostomy since February 2025  Initial Transition Care Management Follow-up Telephone Call    Past Medical History:  Diagnosis Date   Anticoagulant long-term use    eliquis --- managed by cardiology   Arthritis    Bladder cancer [facility])    urologist--- dr Derrick Fling;  overlapping   Cataract    History of basal cell carcinoma (BCC) excision    History of COVID-19 05/2019   per pt mild symptoms that resolved   HLD (hyperlipidemia)    HTN (hypertension)    Hx of colonic polyps    Insulin  dependent type 2 diabetes mellitus [facility])    endocrinologist--- dr Aldona Amel --  (03-11-2022 per pt check blood sugar 1-2 times daily,  fasting sugar-- 118-120s)   Nocturia    Paroxysmal atrial fibrillation (HCC) 03/12/2019   cardiologist--- dr Floria Hurst   Ureterocele    Use of cane as ambulatory aid    Assessment:  "I am doing fine; no signs of any problems with my urine or urine flow- it has become second-hand managing the urostomy bag since February when I got it; my pee looks normal and the cancer doctor was pleased with everything when I saw him this week.  I only have a few more days of the antibiotic to take and then I will be through.  I did start the probiotics as you suggested last week.  Not having to use my cane, walking fine, feeling fine, eating well.  I am going to try to get better about checking my blood sugars here at home, I have gotten out of the habit and will try to do better"   Denies clinical concerns and sounds to be in no distress throughout TOC  30-day program outreach call today  Patient Reported Symptoms: Cognitive Cognitive Status: Alert and oriented to person, place, and time, Insightful and able to interpret abstract concepts, Normal speech and language skills Cognitive/Intellectual Conditions Management [RPT]: None reported or documented in medical history or problem list      Neurological Neurological Review of Symptoms: No symptoms reported    HEENT HEENT Symptoms Reported: No symptoms reported      Cardiovascular Cardiovascular Symptoms Reported: No symptoms reported Does patient have uncontrolled Hypertension?: No Cardiovascular Conditions: Hypertension Cardiovascular Management Strategies: Medication therapy, Routine screening, Adequate rest, Diet modification  Respiratory Respiratory Symptoms Reported: No symptoms reported Other Respiratory Symptoms: Denies shortness of breath/ cough; sounds to be in no respiratory distress throughout [facility] call today    Endocrine Patient reports the following symptoms related to hypoglycemia or hyperglycemia : No symptoms reported Is patient diabetic?: Yes Is patient checking blood sugars at home?: Yes Endocrine Conditions: Diabetes Endocrine Management Strategies: Medication therapy, Routine screening, Diet modification, Adequate rest, Coping strategies  Gastrointestinal Gastrointestinal Symptoms Reported: No symptoms reported Additional Gastrointestinal Details: Reports "good appetite" and "normal/ regular bowel movement"      Genitourinary Genitourinary Symptoms Reported: No symptoms reported Additional Genitourinary Details: Reinforced management/ self-care of urostomy at home; reinforced signs/ symptoms UTI along with corresponding action plan for same:  patient verbalizes excellent understanding of same without prompting; states "urine looks normal, good amount (volume) and clear pale yellow"  Independently manages urostomy Genitourinary Conditions: Bladder cancer, Urinary tract  infection Genitourinary Management Strategies: Adequate rest, Medical device, Medication therapy, Urostomy  Integumentary Integumentary Symptoms Reported: No symptoms reported Additional Integumentary Details: Reports skin around urostomy "looks fine, cancer doctor was pleased when he saw me this week" Skin Conditions: Other Other Skin Conditions: urostomy Skin Management Strategies: Medical device, Routine screening  Musculoskeletal Musculoskelatal Symptoms Reviewed: Unsteady gait Additional Musculoskeletal Details: confirmed uses assistive devices prn, at baseline -- cane- reports today, "not having to use very much, feel steady on my feet" Musculoskeletal Conditions: Joint pain Musculoskeletal Management Strategies: Medication therapy, Routine screening, Medical device, Adequate rest, Coping strategies      Psychosocial Psychosocial Symptoms Reported: No symptoms reported         There were no vitals filed for this visit.  Medications Reviewed Today     Reviewed by Marizol Borror M, RN (Registered Nurse) on 02/16/24 at 1100  Med List Status: <None>   Medication Order Taking? Sig Documenting Provider Last Dose Status Informant  amLODipine  (NORVASC ) 5 MG tablet [id number]  Take 1 tablet (5 mg total) by mouth daily. Sueellen Emery, MD  Active Self, Pharmacy Records           Med Note [place], [place] D   Tue Oct 03, 2023  3:22 PM)    amoxicillin  (AMOXIL ) 500 MG capsule [id number]  Take 1 capsule (500 mg total) by mouth 3 (three) times daily for 11 days. Manandhar, Sabina, MD  Active   apixaban  (ELIQUIS ) 5 MG TABS tablet [id number]  Take 1 tablet (5 mg total) by mouth 2 (two) times daily. Nahser, Lela Purple, MD  Active Self, Pharmacy Records  atorvastatin  (LIPITOR) 40 MG tablet [id number]  Take 1 tablet (40 mg total) by mouth at bedtime. Audria Leather, MD  Active   B Complex-C (B-COMPLEX WITH VITAMIN C) tablet [id number]  Take 1 tablet by mouth daily. [provider]  Active Self,  Pharmacy Records           Med Note [facility] [place], SUSAN A   Sun Feb 04, 2024  4:51 PM)    Cholecalciferol (VITAMIN D-3 PO) [id number]  Take 1 tablet by mouth daily. [provider]  Active Self, Pharmacy Records           Med Note [facility] [place], SUSAN A   Sun Feb 04, 2024  4:50 PM)    ciprofloxacin  (CIPRO ) 750 MG tablet [id number]  Take 1 tablet (750 mg total) by mouth 2 (two) times daily for 11 days. Manandhar, Sabina, MD  Active   EPINEPHrine  0.3 mg/0.3 mL IJ SOAJ injection [id number]  Inject 0.3 mg into the muscle as needed for anaphylaxis. [name], PA-C  Active Self, Pharmacy Records  glipiZIDE  (GLUCOTROL  XL) 2.5 MG 24 hr tablet [id number]  Take 2.5 mg by mouth daily with breakfast. [provider]  Active Self, Pharmacy Records  glucose blood test strip [id number]  1 each by Other route daily. Use daily for glucose control - Dx 250.00 [name], Estela Y, MD  Active Self, Pharmacy Records  metoprolol  succinate (TOPROL -XL) 25 MG 24 hr tablet [id number]  Take 1 tablet (25 mg total) by mouth daily. Nahser, Lela Purple, MD  Active Self, Pharmacy Records  propranolol  (INDERAL ) 10 MG tablet [id number]  TAKE 1 TABLET BY MOUTH 4 TIMES DAILY AS NEEDED FOR PALPITATIONS, IRREGULAR OR FAST HEART RATE  Nahser, Lela Purple, MD  Active Self, Pharmacy Records  Semaglutide , 1 MG/DOSE, (OZEMPIC , 1 MG/DOSE,) 4 MG/3ML SOPN [id number]  INJECT 1 MG UNDER THE SKIN ONCE A WEEK AS DIRECTED Emilie Harden, MD  Active Self, Pharmacy Records           Med Note [facility] [place], [place] A   Sun Feb 04, 2024  4:53 PM) Saturdays  tadalafil  (CIALIS ) 5 MG tablet [id number]  Take 1 tablet (5 mg total) by mouth daily as needed for erectile dysfunction. Audria Leather, MD  Active            Recommendation:   Continue established TOC plan of care   Follow Up Plan:   Telephone follow-up in 1 week Friday 02/22/24 for week # 3 with Concha Deed, RN CM and Friday 03/01/24 for week # 4 with Tawny Fate RN CM-  as scheduled  Plan for next week's call: Review medication adherence- ensure antibiotics completed as prescribed Review blood sugars at home if patient has started monitoring Reinforce/ provide education re: signs/ symptoms UTI along with action plan  Total time spent from review to signing of note/ including any care coordination interventions:  68 minutes  Pls call/ message for questions,  Dearius Hoffmann [name] Beverley Sherrard, RN, BSN, CCRN Alumnus RN Care Manager  Transitions of Care  [facility]BCI - [facility] [phone]: direct office

## 2024-02-16 NOTE — Patient Instructions (Signed)
 Visit Information  Thank you for taking time to visit with me today. Please don't hesitate to contact me if I can be of assistance to you before our next scheduled telephone appointment.  Your next appointment is by telephone on Friday 02/22/24 for week # 3 with Concha Deed, RN CM and Friday 03/01/24 for week # 4 with Tawny Fate RN CM- as scheduled   Please call the care guide team at [phone] if you need to cancel or reschedule your appointment.   Following are the goals we discussed today:  Patient Self Care Activities:  Attend all scheduled provider appointments Call provider office for new concerns or questions  Participate in Transition of Care Program/Attend TOC scheduled calls Take medications as prescribed   Use assistive devices as needed to prevent falls- your cane If you believe your condition is getting worse- contact your care providers (doctors) promptly- reaching out to your doctor early when you have concerns can prevent you from having to go to the hospital  If you are experiencing a Mental Health or Behavioral Health Crisis or need someone to talk to, please  call the Suicide and Crisis Lifeline: 988 call the [place]  National Suicide Prevention Lifeline: 804-357-0662 or TTY: (234) 135-6815 TTY 380-326-8590) to talk to a trained counselor call 1-800-273-TALK (toll free, 24 hour hotline) go to [facility] [address], [place] [phone]) call the [facility] Crisis Line: 802-443-5284 call 911   Patient verbalizes understanding of instructions and care plan provided today and agrees to view in MyChart. Active MyChart status and patient understanding of how to access instructions and care plan via MyChart confirmed with patient.     Robert Canto [name] Alysah Carton, RN, BSN, Media planner  Transitions of Care  [facility]BCI - [facility] [phone]: direct office

## 2024-02-18 ENCOUNTER — Encounter: Payer: Self-pay | Admitting: Family Medicine

## 2024-02-22 ENCOUNTER — Other Ambulatory Visit: Payer: Self-pay | Admitting: *Deleted

## 2024-02-22 NOTE — Patient Outreach (Addendum)
 Transition of Care week 3  Visit Note  02/22/2024  Name: Robert [name] MRN: [id number]          DOB: 08/29/1951  Situation: Patient enrolled in [facility] 30-day program. Visit completed with patient by telephone.   Background: Patient reports he is feeling well, no issues with urostomy reported, changing appliance q 2-3 days and prn, pt is taking probiotics and eating kefir, has finished antibiotics, continues with infusion (bladder cancer) q 6 weeks, pt continues to have right hip pain, follows up with orthopedics. Patient has not checked CBG over the past few days, states he will try to be more diligent and check regularly. Patient reports he has all medications and taking as prescribed.    Past Medical History:  Diagnosis Date   Anticoagulant long-term use    eliquis --- managed by cardiology   Arthritis    Bladder cancer [facility])    urologist--- dr Derrick Fling;  overlapping   Cataract    History of basal cell carcinoma (BCC) excision    History of COVID-19 05/2019   per pt mild symptoms that resolved   HLD (hyperlipidemia)    HTN (hypertension)    Hx of colonic polyps    Insulin  dependent type 2 diabetes mellitus [facility])    endocrinologist--- dr Aldona Amel --  (03-11-2022 per pt check blood sugar 1-2 times daily,  fasting sugar-- 118-120s)   Nocturia    Paroxysmal atrial fibrillation (HCC) 03/12/2019   cardiologist--- dr Floria Hurst   Ureterocele    Use of cane as ambulatory aid     Assessment: Patient Reported Symptoms: Cognitive Cognitive Status: Alert and oriented to person, place, and time, Normal speech and language skills Cognitive/Intellectual Conditions Management [RPT]: None reported or documented in medical history or problem list      Neurological Neurological Review of Symptoms: No symptoms reported    HEENT HEENT Symptoms Reported: No symptoms reported      Cardiovascular Cardiovascular Symptoms Reported: No symptoms reported    Respiratory Respiratory Symptoms  Reported: No symptoms reported    Endocrine Patient reports the following symptoms related to hypoglycemia or hyperglycemia : No symptoms reported Is patient diabetic?: Yes Is patient checking blood sugars at home?:  (but sometimes goes for a few days and does not check) Endocrine Conditions: Diabetes Endocrine Management Strategies: Medication therapy, Routine screening, Adequate rest Endocrine Self-Management Outcome: 3 (uncertain) Pt states he has not checked CBG in past few days but will try to be more diligent  Gastrointestinal Gastrointestinal Symptoms Reported: No symptoms reported   Nutrition Risk Screen (CP): No indicators present  Genitourinary Genitourinary Symptoms Reported: No symptoms reported Additional Genitourinary Details: Pt denies any signs/ symptoms of UTI, states urine looks clear, pt is independent with care of urostomy Genitourinary Conditions: Bladder cancer, Urinary tract infection (pt scheduled for infusions q 6 weeks) Genitourinary Management Strategies: Adequate rest, Medication therapy, Medical device (pt states he is taking probiotics and eating kefir) Genitourinary Self-Management Outcome: 3 (uncertain) Genitourinary Comment: Reviewed care of peristomal skin, importance of keeping clean and dry, reporting any redness, abnormalities to provider, reviewed good self care, avoiding sick persons, handwashing, regular laboratory monitoring with infusions  Integumentary Integumentary Symptoms Reported: No symptoms reported Additional Integumentary Details: States peristomal skin WNL Skin Conditions: Other (urostomy) Other Skin Conditions: States porta cath site WNL Skin Management Strategies: Routine screening Skin Self-Management Outcome: 4 (good)  Musculoskeletal Musculoskelatal Symptoms Reviewed: Unsteady gait Additional Musculoskeletal Details: uses cane as needed Musculoskeletal Conditions: Joint pain Musculoskeletal Management Strategies: Medication therapy,  Routine screening Musculoskeletal Self-Management Outcome: 3 (uncertain) Musculoskeletal Comment: Pain assessment completed, reviewed interventions such as stretching, heat and cold therapy      Psychosocial Psychosocial Symptoms Reported: No symptoms reported   Major Change/Loss/Stressor/Fears (CP): Medical condition, family Behaviors When Feeling Stressed/Fearful: takes care of spouse with dementia, states coping well, has assistance with spouse (friends he can call on if needed) Techniques to [employer] with Loss/Stress/Change: Diversional activities (gets outside in the sunshine daily) Quality of Family Relationships: supportive    Goals Addressed             This Visit's Progress    VBCI Transitions of Care (TOC) Care Plan       Problems:  Recent Hospitalization for treatment of  UTI/ bacteremia No Hospital Follow Up Provider appointment successfully facilitated scheduling of hospital follow up office visit for 02/14/24 Patient reports he is feeling well, no issues with urostomy reported, changing appliance q 2-3 days and prn, pt is taking probiotics and eating kefir, has finished antibiotics, continues with infusion (bladder cancer) q 6 weeks, pt continues to have right hip pain, follows up with orthopedics  Goal:  Over the next 30 days, the patient will not experience hospital readmission  Interventions:  Transitions of Care:  02/22/24 week 3 ~ on track Durable Medical Equipment (DME) needs assessed with patient/caregiver Doctor Visits  - discussed the importance of doctor visits Reviewed Signs and symptoms of infection Reinforced care of urostomy, importance of keeping skin clean and dry and changing immediately for any leaking Reviewed signs/ symptoms of infection, importance of good handwashing, avoid sick persons and regular laboratory monitoring with infusions Reviewed upcoming scheduled appointments including ~ 03/04/24- ID provider; 03/05/24- PCP; 03/14/24- oncology provider:   confirmed patient is aware of all and has plans to attend as scheduled Reinforced previously provided education re: signs/ symptoms UTI along with corresponding action plan- he verbalizes excellent understanding of same without prompting Reviewed importance of drinking adequate fluids preferably water  Pain assessment completed, reviewed pain management strategies such as stretching, heat and cold, relaxation  Patient Self Care Activities:  Attend all scheduled provider appointments Call provider office for new concerns or questions  Participate in Transition of Care Program/Attend TOC scheduled calls Take medications as prescribed   Use assistive devices as needed to prevent falls- your cane If you believe your condition is getting worse- contact your care providers (doctors) promptly- reaching out to your doctor early when you have concerns can prevent you from having to go to the hospital Keep skin around urostomy clean and dry, change appliance immediately if have any leakage, report any redness to your provider Stay well hydrated- drink preferably water   Plan:  Telephone follow up appointment with care management team member scheduled for: Friday 03/01/24 for week # 4 with Tawny Fate RN CM- as scheduled  Plan for next week's call: Review medication adherence Pain assessment Review blood sugars at home if patient has started monitoring, reinforce importance Reinforce/ provide education re: signs/ symptoms UTI along with action plan         There were no vitals filed for this visit.  Medications Reviewed Today     Reviewed by Daralyn Earl, RN (Registered Nurse) on 02/22/24 at 1425  Med List Status: <None>   Medication Order Taking? Sig Documenting Provider Last Dose Status Informant  amLODipine  (NORVASC ) 5 MG tablet [id number] No Take 1 tablet (5 mg total) by mouth daily. Sueellen Emery, MD Taking Active Self, Pharmacy Records  Med Note [place], [place] D   Tue Oct 03, 2023   3:22 PM)    apixaban  (ELIQUIS ) 5 MG TABS tablet [id number] No Take 1 tablet (5 mg total) by mouth 2 (two) times daily. Nahser, Lela Purple, MD Taking Active Self, Pharmacy Records  atorvastatin  (LIPITOR) 40 MG tablet [id number] No Take 1 tablet (40 mg total) by mouth at bedtime. Audria Leather, MD Taking Active   B Complex-C (B-COMPLEX WITH VITAMIN C) tablet [id number] No Take 1 tablet by mouth daily. [provider] Taking Active Self, Pharmacy Records           Med Note [facility] [place], SUSAN A   Sun Feb 04, 2024  4:51 PM)    Cholecalciferol (VITAMIN D-3 PO) [id number] No Take 1 tablet by mouth daily. [provider] Taking Active Self, Pharmacy Records           Med Note [facility] [place], SUSAN A   Sun Feb 04, 2024  4:50 PM)    EPINEPHrine  0.3 mg/0.3 mL IJ SOAJ injection [id number] No Inject 0.3 mg into the muscle as needed for anaphylaxis. [name], PA-C Taking Active Self, Pharmacy Records  glipiZIDE  (GLUCOTROL  XL) 2.5 MG 24 hr tablet [id number] No Take 2.5 mg by mouth daily with breakfast. [provider] Taking Active Self, Pharmacy Records           Med Note Errol Heaps, LAINE M   Fri Feb 16, 2024  3:44 PM) 02/16/24: confirmed during [facility] call patient is taking as prescribed: reports dose increased on 02/13/24 per endocrinology provider instructions  glucose blood test strip [id number] No 1 each by Other route daily. Use daily for glucose control - Dx 250.00 [name], [name], MD Taking Active Self, Pharmacy Records  metoprolol  succinate (TOPROL -XL) 25 MG 24 hr tablet [id number] No Take 1 tablet (25 mg total) by mouth daily. Nahser, Lela Purple, MD Taking Active Self, Pharmacy Records  propranolol  (INDERAL ) 10 MG tablet [id number] No TAKE 1 TABLET BY MOUTH 4 TIMES DAILY AS NEEDED FOR PALPITATIONS, IRREGULAR OR FAST HEART RATE Nahser, Lela Purple, MD Taking Active Self, Pharmacy Records  Semaglutide , 1 MG/DOSE, (OZEMPIC , 1 MG/DOSE,) 4 MG/3ML SOPN [id number] No INJECT 1  MG UNDER THE SKIN ONCE A WEEK AS DIRECTED Emilie Harden, MD Taking Active Self, Pharmacy Records           Med Note [facility] [place], [place] A   Sun Feb 04, 2024  4:53 PM) Saturdays  tadalafil  (CIALIS ) 5 MG tablet [id number] No Take 1 tablet (5 mg total) by mouth daily as needed for erectile dysfunction. Audria Leather, MD Taking Active             Recommendation:   PCP Follow-up Specialty provider follow-up oncology  Follow Up Plan:   Telephone follow-up 03/01/24 @ 11 am with Laine Tousey RN  Cecilie Coffee [facility], BSN RN Care Manager/ Transition of Care [place]/ [facility] [phone]

## 2024-02-22 NOTE — Patient Instructions (Signed)
 Visit Information  Thank you for taking time to visit with me today. Please don't hesitate to contact me if I can be of assistance to you before our next scheduled telephone appointment.  Our next appointment is by telephone on 03/01/24 at 11 am  Following is a copy of your care plan:   Goals Addressed             This Visit's Progress    VBCI Transitions of Care (TOC) Care Plan       Problems:  Recent Hospitalization for treatment of  UTI/ bacteremia No Hospital Follow Up Provider appointment successfully facilitated scheduling of hospital follow up office visit for 02/14/24 Patient reports he is feeling well, no issues with urostomy reported, changing appliance q 2-3 days and prn, pt is taking probiotics and eating kefir, has finished antibiotics, continues with infusion (bladder cancer) q 6 weeks, pt continues to have right hip pain, follows up with orthopedics  Goal:  Over the next 30 days, the patient will not experience hospital readmission  Interventions:  Transitions of Care: 02/22/24 week 3 ~ on track Durable Medical Equipment (DME) needs assessed with patient/caregiver Doctor Visits  - discussed the importance of doctor visits Reviewed Signs and symptoms of infection Reinforced care of urostomy, importance of keeping skin clean and dry and changing immediately for any leaking Reviewed signs/ symptoms of infection, importance of good handwashing, avoid sick persons and regular laboratory monitoring with infusions Reviewed upcoming scheduled appointments including ~ 03/04/24- ID provider; 03/05/24- PCP; 03/14/24- oncology provider:  confirmed patient is aware of all and has plans to attend as scheduled Reinforced previously provided education re: signs/ symptoms UTI along with corresponding action plan- he verbalizes excellent understanding of same without prompting Reviewed importance of drinking adequate fluids preferably water  Pain assessment completed, reviewed pain management  strategies such as stretching, heat and cold, relaxation  Patient Self Care Activities:  Attend all scheduled provider appointments Call provider office for new concerns or questions  Participate in Transition of Care Program/Attend TOC scheduled calls Take medications as prescribed   Use assistive devices as needed to prevent falls- your cane If you believe your condition is getting worse- contact your care providers (doctors) promptly- reaching out to your doctor early when you have concerns can prevent you from having to go to the hospital Keep skin around urostomy clean and dry, change appliance immediately if have any leakage, report any redness to your provider Stay well hydrated- drink preferably water   Plan:  Telephone follow up appointment with care management team member scheduled for: Friday 03/01/24 for week # 4 with Tawny Fate RN CM- as scheduled  Plan for next week's call: Review medication adherence Pain assessment Review blood sugars at home if patient has started monitoring, reinforce importance Reinforce/ provide education re: signs/ symptoms UTI along with action plan         Patient verbalizes understanding of instructions and care plan provided today and agrees to view in MyChart. Active MyChart status and patient understanding of how to access instructions and care plan via MyChart confirmed with patient.     Telephone follow up appointment with care management team member scheduled for: 03/01/24 @ 11 am  Please call the care guide team at [phone] if you need to cancel or reschedule your appointment.   Please call the Suicide and Crisis Lifeline: 988 call the [place]  National Suicide Prevention Lifeline: 340-001-6065 or TTY: 628 644 8985 TTY 867-282-5743) to talk to a trained counselor call 1-800-273-TALK (toll free, 24  hour hotline) go to [facility] [address], [place] [phone]) call 911 if you are experiencing a  Mental Health or Behavioral Health Crisis or need someone to talk to.  Cecilie Coffee [facility], BSN RN Care Manager/ Transition of Care [place] [phone]

## 2024-02-27 ENCOUNTER — Telehealth: Payer: Self-pay

## 2024-02-27 NOTE — Telephone Encounter (Signed)
 Patient called stating that he started having mild chills recently. Patient checked his temperature it is 98.1. Had chemotherapy infusion on 4/24. Patient wanted to inform Dr.Manandhar, has follow up appointment on 5/12.   Donal Lynam Roann Chestnut, CMA

## 2024-02-28 NOTE — Telephone Encounter (Signed)
 Left voicemail asking patient to return my call.   Yvonne Stopher Lesli Albee, CMA

## 2024-02-29 ENCOUNTER — Ambulatory Visit: Admitting: Hematology

## 2024-02-29 ENCOUNTER — Ambulatory Visit: Admitting: Infectious Diseases

## 2024-02-29 ENCOUNTER — Other Ambulatory Visit: Payer: Self-pay

## 2024-02-29 ENCOUNTER — Other Ambulatory Visit

## 2024-02-29 ENCOUNTER — Ambulatory Visit

## 2024-02-29 VITALS — BP 168/77 | HR 82 | Temp 98.7°F | Wt 209.0 lb

## 2024-02-29 DIAGNOSIS — R7881 Bacteremia: Secondary | ICD-10-CM

## 2024-02-29 DIAGNOSIS — A488 Other specified bacterial diseases: Secondary | ICD-10-CM

## 2024-02-29 DIAGNOSIS — Z95828 Presence of other vascular implants and grafts: Secondary | ICD-10-CM | POA: Diagnosis not present

## 2024-02-29 DIAGNOSIS — R6883 Chills (without fever): Secondary | ICD-10-CM | POA: Diagnosis not present

## 2024-02-29 DIAGNOSIS — Z79899 Other long term (current) drug therapy: Secondary | ICD-10-CM

## 2024-02-29 NOTE — Progress Notes (Signed)
 Patient Active Problem List   Diagnosis Date Noted   Chills 02/29/2024   Medication management 02/29/2024   Hydronephrosis of left kidney 02/05/2024   UTI (urinary tract infection) 02/05/2024   Bacteremia 02/04/2024   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  10/09/2023   Hypomagnesemia 07/03/2023   Port-A-Cath in place 06/09/2023   Elevated PSA measurement 02/28/2023   Preop examination 02/27/2023   Osteoarthritis of right hip 06/29/2022   Bladder cancer (HCC) 01/31/2022   Primary osteoarthritis of right hip 10/19/2021   Long term current use of anticoagulant -Eliquis  03/12/2019   Paroxysmal atrial fibrillation (HCC) 03/12/2019   Overweight (BMI 25.0-29.9) 01/03/2019   Type 2 diabetes mellitus without complication, without long-term current use of insulin  (HCC) 07/23/2018   BPH associated with nocturia 03/01/2017   Irregular heart rate 06/23/2011   Hx of colonic polyps    ERECTILE DYSFUNCTION 12/19/2008   SKIN CANCER, HX OF 12/19/2008   Hyperlipidemia 08/21/2007   Essential hypertension 08/21/2007    Patient's Medications  New Prescriptions   No medications on file  Previous Medications   AMLODIPINE  (NORVASC ) 5 MG TABLET    Take 1 tablet (5 mg total) by mouth daily.   APIXABAN  (ELIQUIS ) 5 MG TABS TABLET    Take 1 tablet (5 mg total) by mouth 2 (two) times daily.   ATORVASTATIN  (LIPITOR) 40 MG TABLET    Take 1 tablet (40 mg total) by mouth at bedtime.   B COMPLEX-C (B-COMPLEX WITH VITAMIN C) TABLET    Take 1 tablet by mouth daily.   CHOLECALCIFEROL (VITAMIN D-3 PO)    Take 1 tablet by mouth daily.   EPINEPHRINE  0.3 MG/0.3 ML IJ SOAJ INJECTION    Inject 0.3 mg into the muscle as needed for anaphylaxis.   GLIPIZIDE  (GLUCOTROL  XL) 2.5 MG 24 HR TABLET    Take 2.5 mg by mouth daily with breakfast.   GLUCOSE BLOOD TEST STRIP    1 each by Other route daily. Use daily for glucose control - Dx 250.00   METOPROLOL  SUCCINATE (TOPROL -XL) 25 MG 24 HR TABLET    Take 1 tablet (25 mg total) by mouth daily.    PROPRANOLOL  (INDERAL ) 10 MG TABLET    TAKE 1 TABLET BY MOUTH 4 TIMES DAILY AS NEEDED FOR PALPITATIONS, IRREGULAR OR FAST HEART RATE   SEMAGLUTIDE , 1 MG/DOSE, (OZEMPIC , 1 MG/DOSE,) 4 MG/3ML SOPN    INJECT 1 MG UNDER THE SKIN ONCE A WEEK AS DIRECTED   TADALAFIL  (CIALIS ) 5 MG TABLET    Take 1 tablet (5 mg total) by mouth daily as needed for erectile dysfunction.  Modified Medications   No medications on file  Discontinued Medications   No medications on file    Subjective: Discussed the use of AI scribe software for clinical note transcription with the patient, who gave verbal consent to proceed.   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 Underwent bedside stent placement by Urology on 4/14. Initially on IV cefepime  and PO amoxicillin  which was transitioned to PO ciprofloxacin  and PO amoxicillin  during discharge  to complete 2 weeks course through 02/18/24  5/8 Seen by endocrinology 4/22, PCP 4/23 and Oncology 4/24. Plan to start Nivolumab  noted on May 20 th. He reports completion of antibiotics as prescribed without missing doses or concerns. However, reports chills for last 4 to 5 days, primarily when sitting, without associated shaking. Temperature readings have been 35F in the morning and 98.36F recently, with no fever noted. He denies nausea, vomiting, diarrhea, abdominal pain, cough, chest pain, or shortness of breath. No concerns at port site, urostomy  with clear urine. Appetite is good. No rashes, neurological symptoms, peripheral joint pain/swelling.   Review of Systems: all systems reviewed with pertinent positives and negatives as listed above  Past Medical History:  Diagnosis Date   Anticoagulant long-term use    eliquis --- managed by cardiology   Arthritis    Bladder cancer [facility])    urologist--- dr Derrick Fling;  overlapping   Cataract    History of basal cell carcinoma (BCC) excision    History of COVID-19 05/2019   per pt mild symptoms that resolved   HLD (hyperlipidemia)    HTN (hypertension)    Hx of colonic polyps    Insulin  dependent type 2 diabetes mellitus [facility])    endocrinologist--- dr Aldona Amel --  (03-11-2022 per pt check blood sugar 1-2 times daily,  fasting sugar-- 118-120s)   Nocturia    Paroxysmal atrial fibrillation (HCC) 03/12/2019   cardiologist--- dr Floria Hurst   Ureterocele    Use of cane as ambulatory aid    Past Surgical History:  Procedure Laterality Date   BLEPHAROPLASTY Bilateral    per pt approx  2001;   upper eyelid's   COLONOSCOPY     last one approx 2023   CYSTOSCOPY W/ RETROGRADES N/A 12/08/2023   Procedure: CYSTOSCOPY;  Surgeon: Melody Spurling., MD;  Location: [facility];  Service: Urology;  Laterality: N/A;  390 MINUTES NEEDED FOR CASE   CYSTOSCOPY WITH URETEROSCOPY AND STENT PLACEMENT Right 12/02/2022   Procedure: CYSTOSCOPY WITH RIGHT URETEROSCOPY, RIGHT DIAGNOSTIC URETEROSCOPY;  Surgeon: Christina Coyer, MD;  Location: [facility];  Service: Urology;  Laterality: Right;  90 MINS FOR CASE   CYSTOSCOPY/RETROGRADE/URETEROSCOPY Bilateral 05/05/2023   Procedure: CYSTOSCOPY BILATERAL RETROGRADE PYELOGRAM RIGHT URETEROSCOPY RIGHT STENT PLACEMENT;  Surgeon: Christina Coyer, MD;  Location: [facility];  Service: Urology;  Laterality: Bilateral;  90 MINS FOR CASE   IR IMAGING GUIDED PORT INSERTION  05/30/2023   PATELLAR TENDON REPAIR Left 04/16/2004   @[facility]    ROBOT ASSITED LAPAROSCOPIC  NEPHROURETERECTOMY Right 12/08/2023   Procedure: RIGHT ROBOT ASSISTED Nephroureterectomy;  Surgeon: Melody Spurling., MD;  Location: [facility];  Service: Urology;  Laterality: Right;   ROBOT LAP RADICAL CYSTOPROSTATECTOMY PELVIC LYMPHADENECTOMY, NEOBLADDER  12/08/2023   Procedure: ROBOT ASSISTED LAPAROSCOPIC RADICAL CYSTOPROSTATECTOMY;  Surgeon: Melody Spurling., MD;  Location: [facility];  Service: Urology;;   TOTAL URETHRECTOMY,RADICAL N/A 12/08/2023   Procedure: TOTAL URETHRECTOMY,RADICAL;  Surgeon: Melody Spurling., MD;  Location: [facility];  Service: Urology;  Laterality: N/A;   TRANSURETHRAL RESECTION OF BLADDER TUMOR N/A 06/18/2021   Procedure: TRANSURETHRAL RESECTION OF BLADDER TUMOR (TURBT) WITH RIGHT URETERAL STENT PLACEMENT, RIGHT URETEROSCOPY WITH DISTRACTION OF TUMOR, FULGURATION;  Surgeon: Christina Coyer, MD;  Location: [facility];  Service: Urology;  Laterality: N/A;   TRANSURETHRAL RESECTION OF BLADDER TUMOR N/A 03/15/2022   Procedure: TRANSURETHRAL RESECTION OF BLADDER TUMOR (TURBT) WITH BILATERAL URETEROSCOPY/URETHRAL/ DILATION/BILATERAL RETROGRADE PYELOGRAM/BIOPSY AND FULGURATION OF RIGHT URETERAL CANCER/RIGHT STENT PLACEMENT;  Surgeon: Christina Coyer, MD;  Location: [facility];  Service: Urology;  Laterality: N/A;   TRANSURETHRAL RESECTION OF BLADDER TUMOR N/A 05/05/2023   Procedure: TRANSURETHRAL RESECTION OF BLADDER TUMOR (TURBT) BLADDER BIOSPY;  Surgeon: Christina Coyer, MD;  Location: [facility];  Service: Urology;  Laterality: N/A;   TRANSURETHRAL RESECTION OF BLADDER TUMOR WITH MITOMYCIN -C N/A 12/02/2022   Procedure: TRANSURETHRAL RESECTION OF BLADDER TUMOR;  Surgeon: Christina Coyer, MD;  Location: [facility];  Service: Urology;  Laterality: N/A;   TRANSURETHRAL RESECTION OF PROSTATE  05/05/2023   Procedure: TRANSURETHRAL RESECTION OF THE PROSTATE (TURP);  Surgeon: Christina Coyer, MD;  Location: [facility];  Service: Urology;;    Social History   Tobacco Use   Smoking status: Some Days    Current packs/day: 0.00    Types: Cigars, Cigarettes    Last attempt to quit: 02/22/2011    Years since quitting: 13.0   Smokeless tobacco: Never  Vaping Use   Vaping status: Never Used  Substance Use Topics   Alcohol  use: Yes    Alcohol /week: 3.0 standard drinks of alcohol     Types: 3 Shots of liquor per week    Comment: 6-8oz /wk OCC MARTINI 2 TO 3 X WEEK   Drug use: Never    Family History  Problem Relation Age of Onset   Pancreatic cancer Mother 70   Cancer Paternal Uncle        unknown type cancer   Aortic aneurysm Other        family history   Cancer Other        Breast (1st degree relative)   Colon cancer Neg Hx    Rectal cancer Neg Hx    Stomach cancer Neg Hx    Esophageal cancer Neg Hx     Allergies  Allergen Reactions   Lisinopril  Swelling    Health Maintenance  Topic Date Due   DTaP/Tdap/Td (2 - Tdap) 08/20/2017   OPHTHALMOLOGY EXAM  12/25/2022   COVID-19 Vaccine (4 - 2024-25 season) 06/25/2023   Medicare Annual Wellness (AWV)  03/30/2024   Hepatitis C Screening  03/30/2024 (Originally 08/22/1969)   INFLUENZA VACCINE  05/24/2024   FOOT EXAM  06/11/2024   HEMOGLOBIN A1C  08/14/2024   Diabetic kidney evaluation - Urine ACR  02/12/2025   Diabetic kidney evaluation - eGFR measurement  02/14/2025   Colonoscopy  06/18/2027   Pneumonia Vaccine 38+ Years old  Completed   HPV VACCINES  Aged Out   Meningococcal B Vaccine  Aged Out   Zoster Vaccines- Shingrix  Discontinued    Objective: BP (!) 168/77   Pulse 82   Temp 98.7 F (37.1 C) (Oral)   Wt 209 lb (94.8 kg)   SpO2 100%   BMI 26.83 kg/m    Physical Exam Constitutional:      Appearance: Normal appearance.  HENT:     Head: Normocephalic and atraumatic.      Mouth: Mucous membranes are moist.  Eyes:    Conjunctiva/sclera: Conjunctivae normal.     Pupils: Pupils are equal, round, and b/l symmetrical    Cardiovascular:     Rate and Rhythm: Normal rate and regular rhythm.     Heart sounds: s1s2  Pulmonary:     Effort: Pulmonary effort is normal.     Breath sounds: Normal breath sounds.   Abdominal:     General: Non distended     Palpations: soft. Non tender   Musculoskeletal:  General: Normal range of motion.   Skin:    General: Skin is warm and dry.     Comments:  Neurological:     General: grossly non focal     Mental Status: awake, alert and oriented to person, place, and time.   Psychiatric:        Mood and Affect: Mood normal.   Lab Results Lab Results  Component Value Date   WBC 8.9 02/15/2024   HGB 10.8 (L) 02/15/2024   HCT 31.4 (L) 02/15/2024   MCV 83.5 02/15/2024   PLT 205 02/15/2024    Lab Results  Component Value Date   CREATININE 1.62 (H) 02/15/2024   BUN 40 (H) 02/15/2024   NA 137 02/15/2024   K 4.5 02/15/2024   CL 105 02/15/2024   CO2 26 02/15/2024    Lab Results  Component Value Date   ALT 14 02/15/2024   AST 12 (L) 02/15/2024   ALKPHOS 83 02/15/2024   BILITOT 0.4 02/15/2024    Lab Results  Component Value Date   CHOL 116 02/27/2023   HDL 38.60 (L) 02/27/2023   LDLCALC 47 02/27/2023   LDLDIRECT 77.0 07/07/2015   TRIG 150.0 (H) 02/27/2023   CHOLHDL 3 02/27/2023   No results found for: "LABRPR", "RPRTITER" No results found for: "HIV1RNAQUANT", "HIV1RNAVL", "CD4TABS"  Assessment/Plan # Chills without fevers - no other symptoms including at port  - blood cx 2 sets, CBC, CMP - If fevers, or worsening, discussed needs to go to ED    # Serratia marcescens bacteremia likely urinary source  # Complicated UTI  - no GI symptoms or concerns at port, possible GU source  - s/completion of abtx on 4/27   # Baldder ca  - Per Oncology   I have personally spent 35 minutes involved in face-to-face and non-face-to-face activities for this patient on the day of the visit. Professional time spent includes the following activities:  Preparing to see the patient (review of tests), Obtaining and/or reviewing separately obtained history (admission/discharge record), Performing a medically appropriate examination and/or evaluation , Ordering medications/tests/procedures, referring and communicating with other health care professionals, Documenting clinical information in the EMR, Independently interpreting results (not separately reported), Communicating results to the patient/family/caregiver, Counseling and educating the patient/family/caregiver and Care coordination (not separately reported).   Of note, portions of this note may have been created with voice recognition software. While this note has been edited for accuracy, occasional wrong-word or 'sound-a-like' substitutions may have occurred due to the inherent limitations of voice recognition software.   Melvina Stage, MD [facility] 02/29/2024, 11:10 AM

## 2024-03-01 ENCOUNTER — Telehealth: Payer: Self-pay

## 2024-03-01 ENCOUNTER — Encounter (HOSPITAL_BASED_OUTPATIENT_CLINIC_OR_DEPARTMENT_OTHER): Payer: Self-pay | Admitting: Emergency Medicine

## 2024-03-01 ENCOUNTER — Other Ambulatory Visit: Payer: Self-pay | Admitting: *Deleted

## 2024-03-01 ENCOUNTER — Encounter: Payer: Self-pay | Admitting: Hematology

## 2024-03-01 ENCOUNTER — Other Ambulatory Visit: Payer: Self-pay

## 2024-03-01 ENCOUNTER — Observation Stay (HOSPITAL_BASED_OUTPATIENT_CLINIC_OR_DEPARTMENT_OTHER)
Admission: EM | Admit: 2024-03-01 | Discharge: 2024-03-02 | Disposition: A | Discharge status: Home / Self Care | Attending: Internal Medicine | Admitting: Internal Medicine

## 2024-03-01 ENCOUNTER — Observation Stay (HOSPITAL_COMMUNITY)

## 2024-03-01 ENCOUNTER — Emergency Department (HOSPITAL_BASED_OUTPATIENT_CLINIC_OR_DEPARTMENT_OTHER)

## 2024-03-01 DIAGNOSIS — I10 Essential (primary) hypertension: Secondary | ICD-10-CM | POA: Diagnosis not present

## 2024-03-01 DIAGNOSIS — R2681 Unsteadiness on feet: Secondary | ICD-10-CM | POA: Diagnosis not present

## 2024-03-01 DIAGNOSIS — Z79899 Other long term (current) drug therapy: Secondary | ICD-10-CM | POA: Insufficient documentation

## 2024-03-01 DIAGNOSIS — Z85828 Personal history of other malignant neoplasm of skin: Secondary | ICD-10-CM | POA: Diagnosis not present

## 2024-03-01 DIAGNOSIS — I480 Paroxysmal atrial fibrillation: Secondary | ICD-10-CM | POA: Insufficient documentation

## 2024-03-01 DIAGNOSIS — R7989 Other specified abnormal findings of blood chemistry: Secondary | ICD-10-CM | POA: Diagnosis present

## 2024-03-01 DIAGNOSIS — Z7901 Long term (current) use of anticoagulants: Secondary | ICD-10-CM | POA: Insufficient documentation

## 2024-03-01 DIAGNOSIS — Z8551 Personal history of malignant neoplasm of bladder: Secondary | ICD-10-CM | POA: Diagnosis not present

## 2024-03-01 DIAGNOSIS — E785 Hyperlipidemia, unspecified: Secondary | ICD-10-CM | POA: Insufficient documentation

## 2024-03-01 DIAGNOSIS — N179 Acute kidney failure, unspecified: Secondary | ICD-10-CM | POA: Diagnosis not present

## 2024-03-01 DIAGNOSIS — A488 Other specified bacterial diseases: Secondary | ICD-10-CM | POA: Insufficient documentation

## 2024-03-01 DIAGNOSIS — Z466 Encounter for fitting and adjustment of urinary device: Secondary | ICD-10-CM | POA: Diagnosis not present

## 2024-03-01 DIAGNOSIS — Z955 Presence of coronary angioplasty implant and graft: Secondary | ICD-10-CM | POA: Insufficient documentation

## 2024-03-01 DIAGNOSIS — Z905 Acquired absence of kidney: Secondary | ICD-10-CM | POA: Diagnosis not present

## 2024-03-01 DIAGNOSIS — N1330 Unspecified hydronephrosis: Secondary | ICD-10-CM

## 2024-03-01 DIAGNOSIS — E119 Type 2 diabetes mellitus without complications: Secondary | ICD-10-CM | POA: Insufficient documentation

## 2024-03-01 DIAGNOSIS — Z7985 Long-term (current) use of injectable non-insulin antidiabetic drugs: Secondary | ICD-10-CM | POA: Insufficient documentation

## 2024-03-01 DIAGNOSIS — T83122A Displacement of urinary stent, initial encounter: Secondary | ICD-10-CM

## 2024-03-01 DIAGNOSIS — Z9889 Other specified postprocedural states: Secondary | ICD-10-CM | POA: Insufficient documentation

## 2024-03-01 DIAGNOSIS — Z8616 Personal history of COVID-19: Secondary | ICD-10-CM | POA: Insufficient documentation

## 2024-03-01 DIAGNOSIS — N134 Hydroureter: Secondary | ICD-10-CM | POA: Diagnosis not present

## 2024-03-01 DIAGNOSIS — F17290 Nicotine dependence, other tobacco product, uncomplicated: Secondary | ICD-10-CM | POA: Insufficient documentation

## 2024-03-01 DIAGNOSIS — N132 Hydronephrosis with renal and ureteral calculous obstruction: Secondary | ICD-10-CM | POA: Diagnosis not present

## 2024-03-01 HISTORY — DX: Disorder of kidney and ureter, unspecified: N28.9

## 2024-03-01 LAB — CBC WITH DIFFERENTIAL/PLATELET
Abs Immature Granulocytes: 0.03 10*3/uL (ref 0.00–0.07)
Basophils Absolute: 0 10*3/uL (ref 0.0–0.1)
Basophils Relative: 1 %
Eosinophils Absolute: 0.1 10*3/uL (ref 0.0–0.5)
Eosinophils Relative: 1 %
HCT: 28 % — ABNORMAL LOW (ref 39.0–52.0)
Hemoglobin: 9.4 g/dL — ABNORMAL LOW (ref 13.0–17.0)
Immature Granulocytes: 0 %
Lymphocytes Relative: 18 %
Lymphs Abs: 1.5 10*3/uL (ref 0.7–4.0)
MCH: 28.3 pg (ref 26.0–34.0)
MCHC: 33.6 g/dL (ref 30.0–36.0)
MCV: 84.3 fL (ref 80.0–100.0)
Monocytes Absolute: 0.6 10*3/uL (ref 0.1–1.0)
Monocytes Relative: 7 %
Neutro Abs: 6.3 10*3/uL (ref 1.7–7.7)
Neutrophils Relative %: 73 %
Platelets: 160 10*3/uL (ref 150–400)
RBC: 3.32 MIL/uL — ABNORMAL LOW (ref 4.22–5.81)
RDW: 14.1 % (ref 11.5–15.5)
WBC: 8.5 10*3/uL (ref 4.0–10.5)
nRBC: 0 % (ref 0.0–0.2)

## 2024-03-01 LAB — BASIC METABOLIC PANEL WITH GFR
Anion gap: 14 (ref 5–15)
BUN: 74 mg/dL — ABNORMAL HIGH (ref 8–23)
CO2: 20 mmol/L — ABNORMAL LOW (ref 22–32)
Calcium: 9.8 mg/dL (ref 8.9–10.3)
Chloride: 104 mmol/L (ref 98–111)
Creatinine, Ser: 6.05 mg/dL — ABNORMAL HIGH (ref 0.61–1.24)
GFR, Estimated: 9 mL/min — ABNORMAL LOW (ref >60)
Glucose, Bld: 124 mg/dL — ABNORMAL HIGH (ref 70–99)
Potassium: 4.9 mmol/L (ref 3.5–5.1)
Sodium: 138 mmol/L (ref 135–145)

## 2024-03-01 LAB — LACTIC ACID, PLASMA
Lactic Acid, Venous: 0.7 mmol/L (ref 0.5–1.9)
Lactic Acid, Venous: 1 mmol/L (ref 0.5–1.9)

## 2024-03-01 MED ORDER — ATORVASTATIN CALCIUM 40 MG PO TABS
40.0000 mg | ORAL_TABLET | Freq: Every day | ORAL | Status: DC
  Administered 2024-03-01: 40 mg via ORAL
  Filled 2024-03-01: qty 1

## 2024-03-01 MED ORDER — AMLODIPINE BESYLATE 5 MG PO TABS
5.0000 mg | ORAL_TABLET | Freq: Every day | ORAL | Status: DC
  Administered 2024-03-02: 5 mg via ORAL
  Filled 2024-03-01: qty 1

## 2024-03-01 MED ORDER — ONDANSETRON HCL 4 MG/2ML IJ SOLN: 4.0000 mg | Freq: Four times a day (QID) | INTRAMUSCULAR | Status: DC | PRN

## 2024-03-01 MED ORDER — GLIPIZIDE ER 2.5 MG PO TB24
2.5000 mg | ORAL_TABLET | Freq: Every day | ORAL | Status: DC
  Administered 2024-03-02: 2.5 mg via ORAL
  Filled 2024-03-01 (×2): qty 1

## 2024-03-01 MED ORDER — METOPROLOL SUCCINATE ER 25 MG PO TB24
25.0000 mg | ORAL_TABLET | Freq: Every day | ORAL | Status: DC
  Administered 2024-03-01 – 2024-03-02 (×2): 25 mg via ORAL
  Filled 2024-03-01 (×2): qty 1

## 2024-03-01 MED ORDER — APIXABAN 5 MG PO TABS
5.0000 mg | ORAL_TABLET | Freq: Two times a day (BID) | ORAL | Status: DC
  Administered 2024-03-01 – 2024-03-02 (×2): 5 mg via ORAL
  Filled 2024-03-01 (×2): qty 1

## 2024-03-01 MED ORDER — ACETAMINOPHEN 650 MG RE SUPP: 650.0000 mg | Freq: Four times a day (QID) | RECTAL | Status: DC | PRN

## 2024-03-01 MED ORDER — SENNOSIDES-DOCUSATE SODIUM 8.6-50 MG PO TABS: 1.0000 | ORAL_TABLET | Freq: Every evening | ORAL | Status: DC | PRN

## 2024-03-01 MED ORDER — ACETAMINOPHEN 325 MG PO TABS: 650.0000 mg | ORAL_TABLET | Freq: Four times a day (QID) | ORAL | Status: DC | PRN

## 2024-03-01 MED ORDER — SODIUM CHLORIDE 0.9 % IV BOLUS
1000.0000 mL | Freq: Once | INTRAVENOUS | Status: AC
  Administered 2024-03-01: 1000 mL via INTRAVENOUS

## 2024-03-01 MED ORDER — INSULIN ASPART 100 UNIT/ML IJ SOLN: 0.0000 [IU] | Freq: Three times a day (TID) | INTRAMUSCULAR | Status: DC

## 2024-03-01 MED ORDER — ONDANSETRON HCL 4 MG PO TABS: 4.0000 mg | ORAL_TABLET | Freq: Four times a day (QID) | ORAL | Status: DC | PRN

## 2024-03-01 MED ORDER — SODIUM CHLORIDE 0.9 % IV SOLN: INTRAVENOUS | Status: AC

## 2024-03-01 NOTE — ED Notes (Signed)
 RBVO d/c 2nd lactic acid per Dr. Val Garin.

## 2024-03-01 NOTE — Transitions of Care (Post Inpatient/ED Visit) (Signed)
 Transition of Care week 4/ day # 22  Visit Note  03/01/2024  Name: Robert [name] MRN: [id number]          DOB: 07/06/1951  Situation: Patient enrolled in [facility] 30-day program. Visit completed with patient by telephone.   Background:  Recent hospital visit April 13-16, 2025 for fever/ chills- UTI bacteremia/ hydronephrosis with placement of (L) ureteral stent in setting of baseline bladder CA with urostomy since February 2025 03/01/24: advised to go to ED by ID provider, post-office visit 02/29/24 due to significant increase in Creatinine- abnormal lab results- confirmed patient is en route to ED now  Initial Transition Care Management Follow-up Telephone Call    Past Medical History:  Diagnosis Date   Anticoagulant long-term use    eliquis --- managed by cardiology   Arthritis    Bladder cancer [facility])    urologist--- dr Derrick Fling;  overlapping   Cataract    History of basal cell carcinoma (BCC) excision    History of COVID-19 05/2019   per pt mild symptoms that resolved   HLD (hyperlipidemia)    HTN (hypertension)    Hx of colonic polyps    Insulin  dependent type 2 diabetes mellitus [facility])    endocrinologist--- dr Aldona Amel --  (03-11-2022 per pt check blood sugar 1-2 times daily,  fasting sugar-- 118-120s)   Nocturia    Paroxysmal atrial fibrillation (HCC) 03/12/2019   cardiologist--- dr Floria Hurst   Ureterocele    Use of cane as ambulatory aid    Assessment:  "I am actually on the way to the ER- the ID doctor called this morning and said I need an emergent CT scan-- they are worried that I possibly have another infection or maybe an obstruction developing-- called me after I had my office visit yesterday and said my labs were very concerning.  I have not been feeling very well for the last several days, having chills and fevers.  I am on my way to the ER now; call me next week; I hope I will be home and not at the hospital;"   Reports clinical concerns today as noted- is on way to  ED as advised  Patient Reported Symptoms: Cognitive Cognitive Status: Alert and oriented to person, place, and time, Insightful and able to interpret abstract concepts, Normal speech and language skills Cognitive/Intellectual Conditions Management [RPT]: None reported or documented in medical history or problem list      Neurological Neurological Review of Symptoms: No symptoms reported    HEENT HEENT Symptoms Reported: Nasal discharge, Other: Other HEENT Symptoms/Conditions: Reported during ID provider office visit yesterday "feels nasal;" and has been running intermittent fevers- advised to go to ED today 03/01/24 due to post-visit lab result abnormalities      Cardiovascular Cardiovascular Symptoms Reported: No symptoms reported Does patient have uncontrolled Hypertension?: No Cardiovascular Conditions: Hypertension Cardiovascular Management Strategies: Medication therapy, Routine screening, Diet modification, Adequate rest  Respiratory Respiratory Symptoms Reported: No symptoms reported Other Respiratory Symptoms: Denies shortness of breath/ cough; sounds to be in no repiratory distress throughout TOC call today; slight "nasal" tone noted in patient's voice- as per HEENT assessment    Endocrine Patient reports the following symptoms related to hypoglycemia or hyperglycemia : No symptoms reported, Not assessed (patient on way to ED as advised by ID provider this morning)    Gastrointestinal Gastrointestinal Symptoms Reported: No symptoms reported, Not assessed      Genitourinary Genitourinary Symptoms Reported: No symptoms reported Additional Genitourinary Details: Reports "all seems  fine to me- but the ID doctor told me to go to the ED today as I may have an obstruction or a new infection"- confirmed on way to ED now Genitourinary Conditions: Urinary tract infection, Bladder cancer Genitourinary Management Strategies: Medication therapy, Medical device, Urostomy, Coping strategies   Integumentary Integumentary Symptoms Reported: Fever Additional Integumentary Details: reports intermittent fevers at home x "last few days;" confirmed on way to ED now as advised by ID provider this morning: denies concerns around skin around urosotmy/ porta cath- states "ID doctor said everything looked good yesterday- but there is a problem with the lab results that are very abnormal" Skin Management Strategies: Routine screening, Medical device  Musculoskeletal Musculoskelatal Symptoms Reviewed: No symptoms reported, Not assessed        Psychosocial Psychosocial Symptoms Reported: No symptoms reported, Not assessed         There were no vitals filed for this visit.  Medications Reviewed Today     Reviewed by Sanii Kukla M, RN (Registered Nurse) on 03/01/24 at 1105  Med List Status: <None>   Medication Order Taking? Sig Documenting Provider Last Dose Status Informant  amLODipine  (NORVASC ) 5 MG tablet [id number] No Take 1 tablet (5 mg total) by mouth daily. Sueellen Emery, MD Taking Active Self, Pharmacy Records           Med Note [place], [place] D   Tue Oct 03, 2023  3:22 PM)    apixaban  (ELIQUIS ) 5 MG TABS tablet [id number] No Take 1 tablet (5 mg total) by mouth 2 (two) times daily. Nahser, Lela Purple, MD Taking Active Self, Pharmacy Records  atorvastatin  (LIPITOR) 40 MG tablet [id number] No Take 1 tablet (40 mg total) by mouth at bedtime. Audria Leather, MD Taking Active   B Complex-C (B-COMPLEX WITH VITAMIN C) tablet [id number] No Take 1 tablet by mouth daily. [provider] Taking Active Self, Pharmacy Records           Med Note [facility] [place], SUSAN A   Sun Feb 04, 2024  4:51 PM)    Cholecalciferol (VITAMIN D-3 PO) [id number] No Take 1 tablet by mouth daily. [provider] Taking Active Self, Pharmacy Records           Med Note [facility] [place], SUSAN A   Sun Feb 04, 2024  4:50 PM)    EPINEPHrine  0.3 mg/0.3 mL IJ SOAJ injection [id number] No Inject 0.3 mg  into the muscle as needed for anaphylaxis. [name], PA-C Taking Active Self, Pharmacy Records  glipiZIDE  (GLUCOTROL  XL) 2.5 MG 24 hr tablet [id number] No Take 2.5 mg by mouth daily with breakfast. [provider] Taking Active Self, Pharmacy Records           Med Note Errol Heaps, Lux [place] M   Fri Feb 16, 2024  3:44 PM) 02/16/24: confirmed during [facility] call patient is taking as prescribed: reports dose increased on 02/13/24 per endocrinology provider instructions  glucose blood test strip [id number] No 1 each by Other route daily. Use daily for glucose control - Dx 250.00 [name], [name], MD Taking Active Self, Pharmacy Records  metoprolol  succinate (TOPROL -XL) 25 MG 24 hr tablet [id number] No Take 1 tablet (25 mg total) by mouth daily. Nahser, Lela Purple, MD Taking Active Self, Pharmacy Records  propranolol  (INDERAL ) 10 MG tablet [id number] No TAKE 1 TABLET BY MOUTH 4 TIMES DAILY AS NEEDED FOR PALPITATIONS, IRREGULAR OR FAST HEART RATE Nahser, Lela Purple, MD Taking Active Self, Pharmacy Records  Semaglutide , 1 MG/DOSE, (OZEMPIC , 1 MG/DOSE,)  4 MG/3ML SOPN [id number] No INJECT 1 MG UNDER THE SKIN ONCE A WEEK AS DIRECTED Emilie Harden, MD Taking Active Self, Pharmacy Records           Med Note [facility] [place], [place] A   Sun Feb 04, 2024  4:53 PM) Saturdays  tadalafil  (CIALIS ) 5 MG tablet [id number] No Take 1 tablet (5 mg total) by mouth daily as needed for erectile dysfunction. Audria Leather, MD Taking Active            Recommendation:   ED visit as advised by ID provider earlier this morning  Follow Up Plan:   Telephone follow-up in 1 week- as scheduled 03/05/24 if not re-admitted to hospital  Plan for next week's call: Review ED visit on 03/01/24/ lab values/ plan of care- if not admitted to hospital Review medication adherence Review upcoming provider appointments Pain assessment Review blood sugars at home if patient has started monitoring, reinforce importance of routine  blood sugar monitoring at home Reinforce/ provide education re: signs/ symptoms UTI along with action plan  Pls call/ message for questions,  Kazuto Sevey [name] Jaleen Grupp, RN, BSN, CCRN Alumnus RN Care Manager  Transitions of Care  [facility] - [facility] [phone]: direct office

## 2024-03-01 NOTE — Telephone Encounter (Signed)
 Spoke with Robert [name], discussed per Dr. Gillian Lacrosse that his creatinine has increased significantly from 1.6 to 5.3 and that he needs a CT to rule out any obstruction. Advised per Dr. Gillian Lacrosse that he go to the ED as she's concerned he could be developing an infection, especially in light of the chills he's had. Patient verbalized understanding and has no further questions.   Robert [name], BSN, RN

## 2024-03-01 NOTE — H&P (Addendum)
 History and Physical  Robert [name]:[id number] DOB: Mar 02, 1951 DOA: 03/01/2024  PCP: Genia Kettering, MD   Chief Complaint: Elevated creatinine  HPI: Robert [name] is a 73 y.o. male with medical history significant for paroxysmal A-fib on Eliquis , HTN, HLD metastatic bladder cancer s/p cystoprostatectomy, total ureterectomy and right nephroureterectomy and solitary left kidney s/p ureterostomy who was advised by his ID physician to present to the ED due to elevated creatinine. Patient reports he had mild chills 4 to 5 days ago so he presented to his infectious disease doctor for further evaluation. The chills have since improved, and has not had any fevers, nausea, vomiting, dysuria, abdominal pain, flank pain, headaches, hematuria or dizziness. Today, he received a call from his ID office to present to the ED due to elevated creatinine from 1.6 2 weeks ago to 5.38 yesterday.   ED Course: Initial vitals show patient afebrile but hypertensive with SBP in the 140s to 160s.  Labs significant for sodium 138, K+ 4.9, BUN/creatinine 74/6.05 (from 40/1.62 on 02/15/24), WBC 6.5, Hgb 9.4, platelet 160, lactic acid 0.7. CT renal study shows a solitary left kidney with mild to moderate hydronephrosis with stent retracted to the mid ureter.  Urology was consulted and recommended admission at [facility] for further evaluation.  Patient was admitted to [facility] service.  Review of Systems: Please see HPI for pertinent positives and negatives. A complete 10 system review of systems are otherwise negative.  Past Medical History:  Diagnosis Date   Anticoagulant long-term use    eliquis --- managed by cardiology   Arthritis    Bladder cancer [facility])    urologist--- dr Derrick Fling;  overlapping   Cataract    History of basal cell carcinoma (BCC) excision    History of COVID-19 05/2019   per pt mild symptoms that resolved   HLD (hyperlipidemia)    HTN (hypertension)    Hx of colonic polyps     Insulin  dependent type 2 diabetes mellitus [facility])    endocrinologist--- dr Aldona Amel --  (03-11-2022 per pt check blood sugar 1-2 times daily,  fasting sugar-- 118-120s)   Nocturia    Paroxysmal atrial fibrillation (HCC) 03/12/2019   cardiologist--- dr Floria Hurst   Renal disorder    Ureterocele    Use of cane as ambulatory aid    Past Surgical History:  Procedure Laterality Date   BLEPHAROPLASTY Bilateral    per pt approx  2001;   upper eyelid's   COLONOSCOPY     last one approx 2023   CYSTOSCOPY W/ RETROGRADES N/A 12/08/2023   Procedure: CYSTOSCOPY;  Surgeon: Melody Spurling., MD;  Location: [facility];  Service: Urology;  Laterality: N/A;  390 MINUTES NEEDED FOR CASE   CYSTOSCOPY WITH URETEROSCOPY AND STENT PLACEMENT Right 12/02/2022   Procedure: CYSTOSCOPY WITH RIGHT URETEROSCOPY, RIGHT DIAGNOSTIC URETEROSCOPY;  Surgeon: Christina Coyer, MD;  Location: [facility];  Service: Urology;  Laterality: Right;  90 MINS FOR CASE   CYSTOSCOPY/RETROGRADE/URETEROSCOPY Bilateral 05/05/2023   Procedure: CYSTOSCOPY BILATERAL RETROGRADE PYELOGRAM RIGHT URETEROSCOPY RIGHT STENT PLACEMENT;  Surgeon: Christina Coyer, MD;  [facility];  Service: Urology;  Laterality: Bilateral;  90 MINS FOR CASE   IR IMAGING GUIDED PORT INSERTION  05/30/2023   PATELLAR TENDON REPAIR Left 04/16/2004   @[facility]    ROBOT ASSITED LAPAROSCOPIC NEPHROURETERECTOMY Right 12/08/2023   Procedure: RIGHT ROBOT ASSISTED Nephroureterectomy;  Surgeon: Melody Spurling., MD;  Location: [facility];  Service: Urology;  Laterality: Right;  ROBOT LAP RADICAL CYSTOPROSTATECTOMY PELVIC LYMPHADENECTOMY, NEOBLADDER  12/08/2023   Procedure: ROBOT ASSISTED LAPAROSCOPIC RADICAL CYSTOPROSTATECTOMY;  Surgeon: Melody Spurling., MD;  Location: [facility];  Service: Urology;;   TOTAL URETHRECTOMY,RADICAL N/A 12/08/2023   Procedure: TOTAL URETHRECTOMY,RADICAL;  Surgeon: Melody Spurling., MD;  Location: [facility];  Service: Urology;   Laterality: N/A;   TRANSURETHRAL RESECTION OF BLADDER TUMOR N/A 06/18/2021   Procedure: TRANSURETHRAL RESECTION OF BLADDER TUMOR (TURBT) WITH RIGHT URETERAL STENT PLACEMENT, RIGHT URETEROSCOPY WITH DISTRACTION OF TUMOR, FULGURATION;  Surgeon: Christina Coyer, MD;  Location: [facility];  Service: Urology;  Laterality: N/A;   TRANSURETHRAL RESECTION OF BLADDER TUMOR N/A 03/15/2022   Procedure: TRANSURETHRAL RESECTION OF BLADDER TUMOR (TURBT) WITH BILATERAL URETEROSCOPY/URETHRAL/ DILATION/BILATERAL RETROGRADE PYELOGRAM/BIOPSY AND FULGURATION OF RIGHT URETERAL CANCER/RIGHT STENT PLACEMENT;  Surgeon: Christina Coyer, MD;  Location: [facility];  Service: Urology;  Laterality: N/A;   TRANSURETHRAL RESECTION OF BLADDER TUMOR N/A 05/05/2023   Procedure: TRANSURETHRAL RESECTION OF BLADDER TUMOR (TURBT) BLADDER BIOSPY;  Surgeon: Christina Coyer, MD;  Location: [facility];  Service: Urology;  Laterality: N/A;   TRANSURETHRAL RESECTION OF BLADDER TUMOR WITH MITOMYCIN -C N/A 12/02/2022   Procedure: TRANSURETHRAL RESECTION OF BLADDER TUMOR;  Surgeon: Christina Coyer, MD;  Location: [facility];  Service: Urology;  Laterality: N/A;   TRANSURETHRAL RESECTION OF PROSTATE  05/05/2023   Procedure: TRANSURETHRAL RESECTION OF THE PROSTATE (TURP);  Surgeon: Christina Coyer, MD;  Location: [facility];  Service: Urology;;   Social History:  reports that he has been smoking cigars and cigarettes. He started smoking about 13 years ago. He has never used smokeless tobacco. He reports current alcohol  use of about 3.0 standard drinks of alcohol  per week. He reports that he does not use drugs.  Allergies  Allergen Reactions   Lisinopril  Swelling    Family History  Problem Relation Age of Onset   Pancreatic cancer Mother 41   Cancer Paternal Uncle        unknown type cancer   Aortic aneurysm Other        family history   Cancer Other        Breast (1st  degree relative)   Colon cancer Neg Hx    Rectal cancer Neg Hx    Stomach cancer Neg Hx    Esophageal cancer Neg Hx      Prior to Admission medications   Medication Sig Start Date End Date Taking? Authorizing Provider  amLODipine  (NORVASC ) 5 MG tablet Take 1 tablet (5 mg total) by mouth daily. 09/13/23   Sueellen Emery, MD  apixaban  (ELIQUIS ) 5 MG TABS tablet Take 1 tablet (5 mg total) by mouth 2 (two) times daily. 12/21/23   Nahser, Lela Purple, MD  atorvastatin  (LIPITOR) 40 MG tablet Take 1 tablet (40 mg total) by mouth at bedtime. 02/07/24   Audria Leather, MD  B Complex-C (B-COMPLEX WITH VITAMIN C) tablet Take 1 tablet by mouth daily.    [provider]  Cholecalciferol (VITAMIN D-3 PO) Take 1 tablet by mouth daily.    [provider]  EPINEPHrine  0.3 mg/0.3 mL IJ SOAJ injection Inject 0.3 mg into the muscle as needed for anaphylaxis. 09/14/23   [name], PA-C  glipiZIDE  (GLUCOTROL  XL) 2.5 MG 24 hr tablet Take 2.5 mg by mouth daily with breakfast.    [provider]  glucose blood test strip 1 each by Other route daily. Use daily for glucose control - Dx 250.00 04/21/20  [name], [name], MD  metoprolol  succinate (TOPROL -XL) 25 MG 24 hr tablet Take 1 tablet (25 mg total) by mouth daily. 09/19/23   Nahser, Lela Purple, MD  propranolol  (INDERAL ) 10 MG tablet TAKE 1 TABLET BY MOUTH 4 TIMES DAILY AS NEEDED FOR PALPITATIONS, IRREGULAR OR FAST HEART RATE 10/25/22   Nahser, Lela Purple, MD  Semaglutide , 1 MG/DOSE, (OZEMPIC , 1 MG/DOSE,) 4 MG/3ML SOPN INJECT 1 MG UNDER THE SKIN ONCE A WEEK AS DIRECTED 07/20/23   Emilie Harden, MD  tadalafil  (CIALIS ) 5 MG tablet Take 1 tablet (5 mg total) by mouth daily as needed for erectile dysfunction. 02/07/24   Audria Leather, MD    Physical Exam: BP (!) 161/83   Pulse (!) 59   Temp 98.4 F (36.9 C) (Oral)   Resp 18   Wt 94.3 kg   SpO2 99%   BMI 26.71 kg/m  General: Pleasant, well-appearing elderly man sitting in  bed. No acute distress. HEENT: [place]/AT. Anicteric sclera CV: RRR. No murmurs, rubs, or gallops. No LE edema Pulmonary: Lungs CTAB. Normal effort. No wheezing or rales. Abdominal: Soft, NT/ND. No CVA tenderness. Ureterostomy stable in LLQ with no erythema or signs of infection, bag with light yellow urine. Extremities: Palpable radial and DP pulses. Normal ROM. Skin: Warm and dry. No obvious rash or lesions. Neuro: A&Ox3. Moves all extremities. Normal sensation to light touch. No focal deficit. Psych: Normal mood and affect          Labs on Admission:  Basic Metabolic Panel: Recent Labs  Lab 02/29/24 1138 03/01/24 1241  NA 139 138  K 4.8 4.9  CL 107 104  CO2 21 20*  GLUCOSE 133* 124*  BUN 77* 74*  CREATININE 5.38* 6.05*  CALCIUM  9.3 9.8   Liver Function Tests: Recent Labs  Lab 02/29/24 1138  AST 7*  ALT 9  BILITOT 0.4  PROT 6.6   No results for input(s): "LIPASE", "AMYLASE" in the last 168 hours. No results for input(s): "AMMONIA" in the last 168 hours. CBC: Recent Labs  Lab 02/29/24 1138 03/01/24 1241  WBC 7.2 8.5  NEUTROABS  --  6.3  HGB 9.6* 9.4*  HCT 29.5* 28.0*  MCV 85.5 84.3  PLT 144 160   Cardiac Enzymes: No results for input(s): "CKTOTAL", "CKMB", "CKMBINDEX", "TROPONINI" in the last 168 hours. BNP (last 3 results) No results for input(s): "BNP" in the last 8760 hours.  ProBNP (last 3 results) No results for input(s): "PROBNP" in the last 8760 hours.  CBG: No results for input(s): "GLUCAP" in the last 168 hours.  Radiological Exams on Admission: CT Renal Stone Study Result Date: 03/01/2024 CLINICAL DATA:  Bladder, prostate, and renal cell carcinoma. Cystoprostatectomy. Chills yesterday. * Tracking Code: BO * EXAM: CT ABDOMEN AND PELVIS WITHOUT CONTRAST TECHNIQUE: Multidetector CT imaging of the abdomen and pelvis was performed following the standard protocol without IV contrast. RADIATION DOSE REDUCTION: This exam was performed according to the  departmental dose-optimization program which includes automated exposure control, adjustment of the mA and/or kV according to patient size and/or use of iterative reconstruction technique. COMPARISON:  02/04/2024 FINDINGS: Lower chest: Smooth septal thickening at the lung bases. Normal heart size. Right coronary artery calcification. Aortic valve calcification. Trace bilateral pleural thickening. Hepatobiliary: Normal liver. Normal gallbladder, without biliary ductal dilatation. Pancreas: Normal, without mass or ductal dilatation. Spleen: Normal in size, without focal abnormality. Adrenals/Urinary Tract: Bilateral adrenal thickening is unchanged. Right nephrectomy. No left renal calculi. Mild left-sided hydroureteronephrosis again identified. Followed to the  level of the left abdominal wall urinary diversion. Interval placement of a catheter or stent within distal most left ureter. Perinephric edema is moderate, increased. Status post cystectomy. Stomach/Bowel: Normal stomach, without wall thickening. Lipoma within the distal duodenum including at 11 mm without complicating obstruction. Scattered colonic diverticula. Normal terminal ileum. Normal appendix. Vascular/Lymphatic: Aortic atherosclerosis. No abdominopelvic adenopathy. Reproductive: Prostatectomy. Other: Trace pelvic fluid, minimally increased. No free intraperitoneal air. Musculoskeletal: Degenerative changes of both hips, greater on the right. Lumbosacral spondylosis. IMPRESSION: 1. Status post right nephrectomy and cystoprostatectomy. 2. Similar mild left-sided hydroureteronephrosis, without obstructive cause. Interval placement of a catheter or stent in the distal most ureter, just proximal to the urinary diversion in the left abdominal wall. 3. Increased left renal/perirenal edema which could all be due to hydronephrosis or superimposed pyelonephritis. 4. Increased small volume pelvic fluid. Development of bibasilar septal thickening. Findings suggest  fluid overload or mild congestive heart failure. 5. Aortic valvular calcifications. Consider echocardiography to evaluate for valvular dysfunction. 6. Coronary artery atherosclerosis. Aortic Atherosclerosis (ICD10-I70.0). Electronically Signed   By: Lore Rode M.D.   On: 03/01/2024 14:00   Assessment/Plan Robert [name] is a 73 y.o. male with medical history significant for paroxysmal A-fib on Eliquis , HTN, HLD metastatic bladder cancer s/p cystoprostatectomy, total ureterectomy and right nephroureterectomy and solitary left kidney s/p ureterostomy who was advised by his ID physician to present to the ED due to elevated creatinine and admitted for AKI.  # Acute kidney injury - Found to have significant rise in creatinine from 1.62-6.05 over the last 2 weeks. - CT showing left-sided hydronephrosis with retraction of the left ureteral stent to the mid ureter - AKI most likely secondary to the above - Patient seen by urologist with placement of a new ureteral stent at bedside - KUB for confirmation of stent location pending - Give IV LR 1 L bolus, followed by IVNS 100 cc/h for 10 hours - Trend renal function - Avoid nephrotoxic agents - Urology okay for discharge home tomorrow if creatinine is trending down, will follow-up with pt in the outpatient  # Metastatic bladder cancer - Underwent robotic cystoprostatectomy, right nephro ureterectomy, total ureterectomy, and urinary diversion with left cutaneous ureterostomy in February 2025. - Followed closely by urology and oncology - Recently started adjuvant nivolumab  infusion   # Paroxysmal A-fib - HR stable in the 60s to 80s - Continue Toprol -XL and Eliquis  - Telemetry  # Type 2 diabetes - A1c 7.1% 2 weeks ago - Continue glipizide  - SSI with meals, CBG monitoring  # HTN - BP elevated with SBP in the 140s to 160s - Continue amlodipine  and Toprol -XL  # HLD - Continue atorvastatin    DVT prophylaxis: Eliquis     Code Status:  Full Code  Consults called: Urology  Family Communication: No family at bedside  Severity of Illness: The appropriate patient status for this patient is OBSERVATION. Observation status is judged to be reasonable and necessary in order to provide the required intensity of service to ensure the patient's safety. The patient's presenting symptoms, physical exam findings, and initial radiographic and laboratory data in the context of their medical condition is felt to place them at decreased risk for further clinical deterioration. Furthermore, it is anticipated that the patient will be medically stable for discharge from the hospital within 2 midnights of admission.   Level of care: Med-Surg   This record has been created using Conservation officer, historic buildings. Errors have been sought and corrected, but may not always  be located. Such creation errors do not reflect on the standard of care.   Vita Grip, MD 03/01/2024, 5:47 PM [facility] Pager: [phone] Isaiah 41:10   If 7PM-7AM, please contact night-coverage [email or web address] Password [email or web address]

## 2024-03-01 NOTE — ED Notes (Signed)
 Spoke with Lauraine Polite re: hospitalist consult

## 2024-03-01 NOTE — Plan of Care (Signed)
 Oriented to room. No other needs at this time Problem: Education: Goal: Knowledge of the procedure and recovery process will improve Outcome: Progressing   Problem: Bowel/Gastric: Goal: Gastrointestinal status for postoperative course will improve Outcome: Progressing   Problem: Pain Management: Goal: General experience of comfort will improve Outcome: Progressing   Problem: Skin Integrity: Goal: Demonstration of wound healing without infection will improve Outcome: Progressing   Problem: Urinary Elimination: Goal: Ability to avoid or minimize complications of infection will improve Outcome: Progressing Goal: Ability to achieve and maintain urine output will improve Outcome: Progressing Goal: Home care management will improve Outcome: Progressing   Problem: Education: Goal: Knowledge of General Education information will improve Description: Including pain rating scale, medication(s)/side effects and non-pharmacologic comfort measures Outcome: Progressing   Problem: Health Behavior/Discharge Planning: Goal: Ability to manage health-related needs will improve Outcome: Progressing   Problem: Clinical Measurements: Goal: Ability to maintain clinical measurements within normal limits will improve Outcome: Progressing Goal: Will remain free from infection Outcome: Progressing Goal: Diagnostic test results will improve Outcome: Progressing Goal: Respiratory complications will improve Outcome: Progressing Goal: Cardiovascular complication will be avoided Outcome: Progressing   Problem: Activity: Goal: Risk for activity intolerance will decrease Outcome: Progressing   Problem: Nutrition: Goal: Adequate nutrition will be maintained Outcome: Progressing   Problem: Coping: Goal: Level of anxiety will decrease Outcome: Progressing   Problem: Elimination: Goal: Will not experience complications related to bowel motility Outcome: Progressing Goal: Will not experience  complications related to urinary retention Outcome: Progressing   Problem: Pain Managment: Goal: General experience of comfort will improve and/or be controlled Outcome: Progressing   Problem: Safety: Goal: Ability to remain free from injury will improve Outcome: Progressing   Problem: Skin Integrity: Goal: Risk for impaired skin integrity will decrease Outcome: Progressing

## 2024-03-01 NOTE — ED Provider Notes (Signed)
 [place] EMERGENCY DEPARTMENT AT [facility] Provider Note   CSN: [id number] Arrival date & time: 03/01/24  1119     History  Chief Complaint  Patient presents with   Abnormal Labs    SIRCHARLES SANDVIK II is a 73 y.o. male.  HPI Patient sent in for laboratory abnormality.  Has had history of bladder cancer.  Has cystectomy right nephrectomy and ureterectomy.  Has a urostomy on the left abdomen.  Has had stent placed last month due to obstruction.  Has had somewhat decreased output yesterday.  Followed up with infectious ease yesterday and had blood work done that showed creatinine went from 1.62 weeks ago up to 5.4 yesterday.  Not having fevers.  Normal white count.  Has still has had some chills but states is better than previous.  Negative blood cultures as yesterday.  Sent in for CT scan to evaluate for obstruction or infection.  Patient states there is more tube in the bag from the stent and that was previously.   Past Medical History:  Diagnosis Date   Anticoagulant long-term use    eliquis --- managed by cardiology   Arthritis    Bladder cancer [facility])    urologist--- dr Derrick Fling;  overlapping   Cataract    History of basal cell carcinoma (BCC) excision    History of COVID-19 05/2019   per pt mild symptoms that resolved   HLD (hyperlipidemia)    HTN (hypertension)    Hx of colonic polyps    Insulin  dependent type 2 diabetes mellitus [facility])    endocrinologist--- dr Aldona Amel --  (03-11-2022 per pt check blood sugar 1-2 times daily,  fasting sugar-- 118-120s)   Nocturia    Paroxysmal atrial fibrillation (HCC) 03/12/2019   cardiologist--- dr Floria Hurst   Renal disorder    Ureterocele    Use of cane as ambulatory aid    Past Surgical History:  Procedure Laterality Date   BLEPHAROPLASTY Bilateral    per pt approx  2001;   upper eyelid's   COLONOSCOPY     last one approx 2023   CYSTOSCOPY W/ RETROGRADES N/A 12/08/2023   Procedure: CYSTOSCOPY;  Surgeon: Melody Spurling., MD;  [facility]: [facility];  Service: Urology;  Laterality: N/A;  390 MINUTES NEEDED FOR CASE   CYSTOSCOPY WITH URETEROSCOPY AND STENT PLACEMENT Right 12/02/2022   Procedure: CYSTOSCOPY WITH RIGHT URETEROSCOPY, RIGHT DIAGNOSTIC URETEROSCOPY;  Surgeon: Christina Coyer, MD;  [facility];  Service: Urology;  Laterality: Right;  90 MINS FOR CASE   CYSTOSCOPY/RETROGRADE/URETEROSCOPY Bilateral 05/05/2023   Procedure: CYSTOSCOPY BILATERAL RETROGRADE PYELOGRAM RIGHT URETEROSCOPY RIGHT STENT PLACEMENT;  Surgeon: Christina Coyer, MD;  Location: [facility]  Service: Urology;  Laterality: Bilateral;  90 MINS FOR CASE   IR IMAGING GUIDED PORT INSERTION  05/30/2023   PATELLAR TENDON REPAIR Left 04/16/2004   @[facility]    ROBOT ASSITED LAPAROSCOPIC NEPHROURETERECTOMY Right 12/08/2023   Procedure: RIGHT ROBOT ASSISTED Nephroureterectomy;  Surgeon: Melody Spurling., MD;  [facility];  Service: Urology;  Laterality: Right;   ROBOT LAP RADICAL CYSTOPROSTATECTOMY PELVIC LYMPHADENECTOMY, NEOBLADDER  12/08/2023   Procedure: ROBOT ASSISTED LAPAROSCOPIC RADICAL CYSTOPROSTATECTOMY;  Surgeon: Melody Spurling., MD;  [facility];  Service: Urology;;   TOTAL URETHRECTOMY,RADICAL N/A 12/08/2023   Procedure: TOTAL URETHRECTOMY,RADICAL;  Surgeon: Melody Spurling., MD;  [facility];  Service: Urology;  Laterality: N/A;   TRANSURETHRAL RESECTION OF BLADDER TUMOR N/A 06/18/2021   Procedure: TRANSURETHRAL RESECTION OF BLADDER TUMOR (  TURBT) WITH RIGHT URETERAL STENT PLACEMENT, RIGHT URETEROSCOPY WITH DISTRACTION OF TUMOR, FULGURATION;  Surgeon: Christina Coyer, MD;  Location: [facility];  Service: Urology;  Laterality: N/A;   TRANSURETHRAL RESECTION OF BLADDER TUMOR N/A 03/15/2022   Procedure: TRANSURETHRAL RESECTION OF BLADDER TUMOR (TURBT) WITH BILATERAL URETEROSCOPY/URETHRAL/ DILATION/BILATERAL RETROGRADE PYELOGRAM/BIOPSY AND FULGURATION OF RIGHT URETERAL  CANCER/RIGHT STENT PLACEMENT;  Surgeon: Christina Coyer, MD;  Location: [facility];  Service: Urology;  Laterality: N/A;   TRANSURETHRAL RESECTION OF BLADDER TUMOR N/A 05/05/2023   Procedure: TRANSURETHRAL RESECTION OF BLADDER TUMOR (TURBT) BLADDER BIOSPY;  Surgeon: Christina Coyer, MD;  Location: [facility];  Service: Urology;  Laterality: N/A;   TRANSURETHRAL RESECTION OF BLADDER TUMOR WITH MITOMYCIN -C N/A 12/02/2022   Procedure: TRANSURETHRAL RESECTION OF BLADDER TUMOR;  Surgeon: Christina Coyer, MD;  [facility];  Service: Urology;  Laterality: N/A;   TRANSURETHRAL RESECTION OF PROSTATE  05/05/2023   Procedure: TRANSURETHRAL RESECTION OF THE PROSTATE (TURP);  Surgeon: Christina Coyer, MD;  Location: [facility];  Service: Urology;;     Home Medications Prior to Admission medications   Medication Sig Start Date End Date Taking? Authorizing Provider  amLODipine  (NORVASC ) 5 MG tablet Take 1 tablet (5 mg total) by mouth daily. 09/13/23   Sueellen Emery, MD  apixaban  (ELIQUIS ) 5 MG TABS tablet Take 1 tablet (5 mg total) by mouth 2 (two) times daily. 12/21/23   Nahser, Lela Purple, MD  atorvastatin  (LIPITOR) 40 MG tablet Take 1 tablet (40 mg total) by mouth at bedtime. 02/07/24   Audria Leather, MD  B Complex-C (B-COMPLEX WITH VITAMIN C) tablet Take 1 tablet by mouth daily.    [provider]  Cholecalciferol (VITAMIN D-3 PO) Take 1 tablet by mouth daily.    [provider]  EPINEPHrine  0.3 mg/0.3 mL IJ SOAJ injection Inject 0.3 mg into the muscle as needed for anaphylaxis. 09/14/23   [name], PA-C  glipiZIDE  (GLUCOTROL  XL) 2.5 MG 24 hr tablet Take 2.5 mg by mouth daily with breakfast.    [provider]  glucose blood test strip 1 each by Other route daily. Use daily for glucose control - Dx 250.00 04/21/20   [name], [name], MD  metoprolol  succinate (TOPROL -XL) 25 MG 24 hr tablet Take 1 tablet (25  mg total) by mouth daily. 09/19/23   Nahser, Lela Purple, MD  propranolol  (INDERAL ) 10 MG tablet TAKE 1 TABLET BY MOUTH 4 TIMES DAILY AS NEEDED FOR PALPITATIONS, IRREGULAR OR FAST HEART RATE 10/25/22   Nahser, Lela Purple, MD  Semaglutide , 1 MG/DOSE, (OZEMPIC , 1 MG/DOSE,) 4 MG/3ML SOPN INJECT 1 MG UNDER THE SKIN ONCE A WEEK AS DIRECTED 07/20/23   Emilie Harden, MD  tadalafil  (CIALIS ) 5 MG tablet Take 1 tablet (5 mg total) by mouth daily as needed for erectile dysfunction. 02/07/24   Audria Leather, MD      Allergies    Lisinopril     Review of Systems   Review of Systems  Physical Exam Updated Vital Signs BP (!) 159/83   Pulse 77   Temp 98.4 F (36.9 C) (Oral)   Resp 18   Wt 94.3 kg   SpO2 100%   BMI 26.71 kg/m  Physical Exam Vitals and nursing note reviewed.  HENT:     Head: Atraumatic.  Abdominal:     Tenderness: There is no abdominal tenderness.     Comments: Urostomy left abdomen.  Genitourinary:    Comments: No CVA tenderness on left.  Musculoskeletal:     Cervical back: Neck supple.  Skin:    General: Skin is warm.  Neurological:     Mental Status: He is alert.     ED Results / Procedures / Treatments   Labs (all labs ordered are listed, but only abnormal results are displayed) Labs Reviewed  CBC WITH DIFFERENTIAL/PLATELET - Abnormal; Notable for the following components:      Result Value   RBC 3.32 (*)    Hemoglobin 9.4 (*)    HCT 28.0 (*)    All other components within normal limits  BASIC METABOLIC PANEL WITH GFR - Abnormal; Notable for the following components:   CO2 20 (*)    Glucose, Bld 124 (*)    BUN 74 (*)    Creatinine, Ser 6.05 (*)    GFR, Estimated 9 (*)    All other components within normal limits  LACTIC ACID, PLASMA  LACTIC ACID, PLASMA    EKG None  Radiology No results found.  Procedures Procedures    Medications Ordered in ED Medications - No data to display  ED Course/ Medical Decision Making/ A&P                                  Medical Decision Making Amount and/or Complexity of Data Reviewed Labs: ordered. Radiology: ordered.   Patient acute kidney injury.  Creatinine elevated.  Now up to 6 today 5.4 yesterday 1.6 2 weeks ago.  Has a solo kidney with stent.  Stent appears to be misplaced on my interpretation of the CT scan.  Potassium reassuring.  Discussed with Donelda Fujita, the urology NP.  Will require restenting or advancing of the stent.  Will need admission to the hospital however with the creatinine increased.  Will discuss with hospitalist for admission.  Will keep patient n.p.o. but hopefully should be able to have procedure done at bedtime.  Dr Estanislao Heimlich is the Urologist on call tonight.   Discussed with Dr. Lowell Rude, who accepted in transfer.        Final Clinical Impression(s) / ED Diagnoses Final diagnoses:  AKI (acute kidney injury) [facility])    Rx / DC Orders ED Discharge Orders     None         Mozell Arias, MD 03/01/24 1340

## 2024-03-01 NOTE — ED Notes (Signed)
 Called [facility] for transport, pt bed assignment is ready

## 2024-03-01 NOTE — Consult Note (Signed)
 Urology Consult   I have been asked to see the patient by Dr. Yvonne Hering, for evaluation and management of left hydronephrosis and solitary kidney with misplaced stent.  Chief Complaint: Acute kidney injury  HPI:  Robert [name] is a 73 y.o. with metastatic urothelial cell cancer that has been managed by Dr. Secundino Dach, in February 2025 underwent cystoprostatectomy, total ureterectomy and right nephroureterectomy-has solitary left kidney with cutaneous ureterostomy that has been managed with a stent.  During routine lab work was found to have AKI with a creatinine of 5 from baseline of 1.5 and he was sent to ER for imaging.  CT showed left-sided hydronephrosis with retraction of the left ureteral stent to the mid ureter.  He was transferred for monitoring of his renal function and replacement of stent.  He really denies any complaints this afternoon.  PMH: Past Medical History:  Diagnosis Date   Anticoagulant long-term use    eliquis --- managed by cardiology   Arthritis    Bladder cancer [facility])    urologist--- dr Derrick Fling;  overlapping   Cataract    History of basal cell carcinoma (BCC) excision    History of COVID-19 05/2019   per pt mild symptoms that resolved   HLD (hyperlipidemia)    HTN (hypertension)    Hx of colonic polyps    Insulin  dependent type 2 diabetes mellitus [facility])    endocrinologist--- dr Aldona Amel --  (03-11-2022 per pt check blood sugar 1-2 times daily,  fasting sugar-- 118-120s)   Nocturia    Paroxysmal atrial fibrillation (HCC) 03/12/2019   cardiologist--- dr Floria Hurst   Renal disorder    Ureterocele    Use of cane as ambulatory aid     Surgical History: Past Surgical History:  Procedure Laterality Date   BLEPHAROPLASTY Bilateral    per pt approx  2001;   upper eyelid's   COLONOSCOPY     last one approx 2023   CYSTOSCOPY W/ RETROGRADES N/A 12/08/2023   Procedure: CYSTOSCOPY;  Surgeon: Melody Spurling., MD;  [facility];  Service: Urology;   Laterality: N/A;  390 MINUTES NEEDED FOR CASE   CYSTOSCOPY WITH URETEROSCOPY AND STENT PLACEMENT Right 12/02/2022   Procedure: CYSTOSCOPY WITH RIGHT URETEROSCOPY, RIGHT DIAGNOSTIC URETEROSCOPY;  Surgeon: Christina Coyer, MD;  [facility];  Service: Urology;  Laterality: Right;  90 MINS FOR CASE   CYSTOSCOPY/RETROGRADE/URETEROSCOPY Bilateral 05/05/2023   Procedure: CYSTOSCOPY BILATERAL RETROGRADE PYELOGRAM RIGHT URETEROSCOPY RIGHT STENT PLACEMENT;  Surgeon: Christina Coyer, MD;  Location: [facility];  Service: Urology;  Laterality: Bilateral;  90 MINS FOR CASE   IR IMAGING GUIDED PORT INSERTION  05/30/2023   PATELLAR TENDON REPAIR Left 04/16/2004   @[facility]    ROBOT ASSITED LAPAROSCOPIC NEPHROURETERECTOMY Right 12/08/2023   Procedure: RIGHT ROBOT ASSISTED Nephroureterectomy;  Surgeon: Melody Spurling., MD;  [facility];  Service: Urology;  Laterality: Right;   ROBOT LAP RADICAL CYSTOPROSTATECTOMY PELVIC LYMPHADENECTOMY, NEOBLADDER  12/08/2023   Procedure: ROBOT ASSISTED LAPAROSCOPIC RADICAL CYSTOPROSTATECTOMY;  Surgeon: Melody Spurling., MD;  [facility];  Service: Urology;;   TOTAL URETHRECTOMY,RADICAL N/A 12/08/2023   Procedure: TOTAL URETHRECTOMY,RADICAL;  Surgeon: Melody Spurling., MD;  [facility];  Service: Urology;  Laterality: N/A;   TRANSURETHRAL RESECTION OF BLADDER TUMOR N/A 06/18/2021   Procedure: TRANSURETHRAL RESECTION OF BLADDER TUMOR (TURBT) WITH RIGHT URETERAL STENT PLACEMENT, RIGHT URETEROSCOPY WITH DISTRACTION OF TUMOR, FULGURATION;  Surgeon: Christina Coyer, MD;  [facility]: [facility];  Service: Urology;  Laterality: N/A;   TRANSURETHRAL RESECTION OF BLADDER TUMOR N/A 03/15/2022   Procedure: TRANSURETHRAL RESECTION OF BLADDER TUMOR (TURBT) WITH BILATERAL URETEROSCOPY/URETHRAL/ DILATION/BILATERAL RETROGRADE PYELOGRAM/BIOPSY AND FULGURATION OF RIGHT URETERAL CANCER/RIGHT STENT PLACEMENT;  Surgeon: Christina Coyer, MD;   Location: [facility];  Service: Urology;  Laterality: N/A;   TRANSURETHRAL RESECTION OF BLADDER TUMOR N/A 05/05/2023   Procedure: TRANSURETHRAL RESECTION OF BLADDER TUMOR (TURBT) BLADDER BIOSPY;  Surgeon: Christina Coyer, MD;  Location: [facility];  Service: Urology;  Laterality: N/A;   TRANSURETHRAL RESECTION OF BLADDER TUMOR WITH MITOMYCIN -C N/A 12/02/2022   Procedure: TRANSURETHRAL RESECTION OF BLADDER TUMOR;  Surgeon: Christina Coyer, MD;  [facility];  Service: Urology;  Laterality: N/A;   TRANSURETHRAL RESECTION OF PROSTATE  05/05/2023   Procedure: TRANSURETHRAL RESECTION OF THE PROSTATE (TURP);  Surgeon: Christina Coyer, MD;  Location: [facility];  Service: Urology;;     Allergies:  Allergies  Allergen Reactions   Lisinopril  Swelling    Family History: Family History  Problem Relation Age of Onset   Pancreatic cancer Mother 11   Cancer Paternal Uncle        unknown type cancer   Aortic aneurysm Other        family history   Cancer Other        Breast (1st degree relative)   Colon cancer Neg Hx    Rectal cancer Neg Hx    Stomach cancer Neg Hx    Esophageal cancer Neg Hx     Social History:  reports that he has been smoking cigars and cigarettes. He started smoking about 13 years ago. He has never used smokeless tobacco. He reports current alcohol  use of about 3.0 standard drinks of alcohol  per week. He reports that he does not use drugs.  ROS: Negative aside from those stated in the HPI.  Physical Exam: BP (!) 161/83   Pulse (!) 59   Temp 98.4 F (36.9 C) (Oral)   Resp 18   Wt 94.3 kg   SpO2 99%   BMI 26.71 kg/m    Constitutional:  Alert and oriented, No acute distress. Cardiovascular: No clubbing, cyanosis, or edema. Respiratory: Normal respiratory effort, no increased work of breathing. GI: Abdomen is soft, nontender, nondistended, no abdominal masses Abdomen soft, nontender, incisions  healed Ureterostomy with no signs of erythema or infection, stent protruding 8 to 10 cm.  Laboratory Data: Reviewed in epic  Pertinent Imaging: I have personally reviewed the CT scan showing a solitary left kidney with mild to moderate hydronephrosis with stent retracted to the mid ureter.  Assessment & Plan:   73 year old patient who has been managed by Dr. Secundino Dach for metastatic urothelial cell carcinoma and in February 2025 underwent robotic cystoprostatectomy, right nephro ureterectomy, total ureterectomy, and urinary diversion with left cutaneous ureterostomy.  This has been managed with a ureteral stent.  The stent appears to have been dislodged and pulled out causing upstream hydronephrosis and acute kidney injury.  No signs of infection or sepsis at this time.  Procedure: His stoma was prepped and draped in standard sterile fashion and I attempted to pass a wire through the ureteral stent.  This met resistance.  I cut the stent closer to the stoma, and still met resistance with a sensor wire.  This point I advanced the sensor wire alongside the stent until some gentle resistance was met likely within the kidney.  The old stent was removed, and a new 7 [place] by  24 cm ureteral stent advanced easily over the wire.  The distal curl was snug against the ureteral opening.  There was brisk drainage of fluid to the side ports of the stent confirming appropriate location.  Recommendations: - KUB today to confirm stent location - Hydration and monitor renal function -Okay for diet from urology perspective - Likely can discharge tomorrow if renal function improving with urology follow-up, will reach out to Dr. Secundino Dach to schedule  Lawerence Pressman, MD  Total time spent on the floor was 60 minutes, with greater than 50% spent in counseling and coordination of care with the patient regarding metastatic urothelial cell carcinoma with recent cystoprostatectomy and right nephro ureterectomy with solitary  left kidney manage with cutaneous ureterostomy and AKI with stent displacement requiring replacement on the floor.  [facility] [address] [place], [place] [address] [phone]

## 2024-03-01 NOTE — ED Triage Notes (Signed)
 Pt reports referral by infectious d/t provider referred to ED d/t abnormal kidney fx labwork

## 2024-03-01 NOTE — Patient Instructions (Signed)
 Visit Information  Thank you for taking time to visit with me today. Please don't hesitate to contact me if I can be of assistance to you before our next scheduled telephone appointment.  Our next appointment is by telephone on Tuesday 03/05/24 at 10:00 am  Please call the care guide team at [phone] if you need to cancel or reschedule your appointment.   Following are the goals we discussed today:  Patient Self Care Activities:  Attend all scheduled provider appointments Call provider office for new concerns or questions  Participate in Transition of Care Program/Attend TOC scheduled calls Take medications as prescribed   Use assistive devices as needed to prevent falls- your cane If you believe your condition is getting worse- contact your care providers (doctors) promptly- reaching out to your doctor early when you have concerns can prevent you from having to go to the hospital Keep skin around urostomy clean and dry, change appliance immediately if have any leakage, report any redness to your provider Stay well hydrated- drink preferably water   If you are experiencing a Mental Health or Behavioral Health Crisis or need someone to talk to, please  call the Suicide and Crisis Lifeline: 988 call the [place]  National Suicide Prevention Lifeline: 719-358-8556 or TTY: (347) 423-5418 TTY (816) 379-5465) to talk to a trained counselor call 1-800-273-TALK (toll free, 24 hour hotline) go to [facility] [address] [place] [phone]) call the [facility] Crisis Line: [phone] call 911   Patient verbalizes understanding of instructions and care plan provided today and agrees to view in MyChart. Active MyChart status and patient understanding of how to access instructions and care plan via MyChart confirmed with patient.     Pls call/ message for questions,  Samaa Ueda [name] Senai Ramnath, RN, BSN, CCRN Alumnus RN Care Manager  Transitions of  Care  [facility] [phone]: direct office

## 2024-03-01 NOTE — ED Notes (Signed)
 Attempted to call report to [facility]. Nurse unavailable at this time. Will attempted to call back later.

## 2024-03-01 NOTE — Telephone Encounter (Signed)
-----   Message from Melvina Stage sent at 03/01/2024  8:02 AM EDT ----- Triage ,  Please inform him that his Creatinine has bumped from approx 1.6 to 5.3 which is significantly high. He needs to get CT abdomen/pelvis t/o rule out there is no obstruction. He was also complaining of chills for last few days and possibly developing an infection. I advise him to go to ED.

## 2024-03-02 DIAGNOSIS — T83122A Displacement of urinary stent, initial encounter: Secondary | ICD-10-CM | POA: Diagnosis not present

## 2024-03-02 DIAGNOSIS — N179 Acute kidney failure, unspecified: Secondary | ICD-10-CM | POA: Diagnosis not present

## 2024-03-02 DIAGNOSIS — E119 Type 2 diabetes mellitus without complications: Secondary | ICD-10-CM

## 2024-03-02 LAB — GLUCOSE, CAPILLARY
Glucose-Capillary: 100 mg/dL — ABNORMAL HIGH (ref 70–99)
Glucose-Capillary: 111 mg/dL — ABNORMAL HIGH (ref 70–99)
Glucose-Capillary: 114 mg/dL — ABNORMAL HIGH (ref 70–99)
Glucose-Capillary: 194 mg/dL — ABNORMAL HIGH (ref 70–99)

## 2024-03-02 LAB — BASIC METABOLIC PANEL WITH GFR
Anion gap: 8 (ref 5–15)
BUN: 53 mg/dL — ABNORMAL HIGH (ref 8–23)
CO2: 24 mmol/L (ref 22–32)
Calcium: 8.8 mg/dL — ABNORMAL LOW (ref 8.9–10.3)
Chloride: 107 mmol/L (ref 98–111)
Creatinine, Ser: 2.91 mg/dL — ABNORMAL HIGH (ref 0.61–1.24)
GFR, Estimated: 22 mL/min — ABNORMAL LOW (ref >60)
Glucose, Bld: 160 mg/dL — ABNORMAL HIGH (ref 70–99)
Potassium: 4.4 mmol/L (ref 3.5–5.1)
Sodium: 139 mmol/L (ref 135–145)

## 2024-03-02 LAB — RENAL FUNCTION PANEL
Albumin: 3.3 g/dL — ABNORMAL LOW (ref 3.5–5.0)
Anion gap: 11 (ref 5–15)
BUN: 62 mg/dL — ABNORMAL HIGH (ref 8–23)
CO2: 18 mmol/L — ABNORMAL LOW (ref 22–32)
Calcium: 8.6 mg/dL — ABNORMAL LOW (ref 8.9–10.3)
Chloride: 108 mmol/L (ref 98–111)
Creatinine, Ser: 3.75 mg/dL — ABNORMAL HIGH (ref 0.61–1.24)
GFR, Estimated: 16 mL/min — ABNORMAL LOW (ref >60)
Glucose, Bld: 109 mg/dL — ABNORMAL HIGH (ref 70–99)
Phosphorus: 4.7 mg/dL — ABNORMAL HIGH (ref 2.5–4.6)
Potassium: 4.1 mmol/L (ref 3.5–5.1)
Sodium: 137 mmol/L (ref 135–145)

## 2024-03-02 LAB — CBC
HCT: 25.3 % — ABNORMAL LOW (ref 39.0–52.0)
Hemoglobin: 8.3 g/dL — ABNORMAL LOW (ref 13.0–17.0)
MCH: 28.3 pg (ref 26.0–34.0)
MCHC: 32.8 g/dL (ref 30.0–36.0)
MCV: 86.3 fL (ref 80.0–100.0)
Platelets: 130 10*3/uL — ABNORMAL LOW (ref 150–400)
RBC: 2.93 MIL/uL — ABNORMAL LOW (ref 4.22–5.81)
RDW: 14.3 % (ref 11.5–15.5)
WBC: 7 10*3/uL (ref 4.0–10.5)
nRBC: 0 % (ref 0.0–0.2)

## 2024-03-02 MED ORDER — LACTATED RINGERS IV SOLN: INTRAVENOUS | Status: DC

## 2024-03-02 NOTE — Plan of Care (Signed)
  Problem: Education: Goal: Knowledge of the procedure and recovery process will improve Outcome: Progressing   Problem: Bowel/Gastric: Goal: Gastrointestinal status for postoperative course will improve Outcome: Progressing   Problem: Pain Management: Goal: General experience of comfort will improve Outcome: Progressing   Problem: Skin Integrity: Goal: Demonstration of wound healing without infection will improve Outcome: Progressing   Problem: Urinary Elimination: Goal: Ability to avoid or minimize complications of infection will improve Outcome: Progressing Goal: Ability to achieve and maintain urine output will improve Outcome: Progressing Goal: Home care management will improve Outcome: Progressing   Problem: Education: Goal: Knowledge of General Education information will improve Description: Including pain rating scale, medication(s)/side effects and non-pharmacologic comfort measures Outcome: Progressing   Problem: Health Behavior/Discharge Planning: Goal: Ability to manage health-related needs will improve Outcome: Progressing   Problem: Clinical Measurements: Goal: Ability to maintain clinical measurements within normal limits will improve Outcome: Progressing Goal: Will remain free from infection Outcome: Progressing Goal: Diagnostic test results will improve Outcome: Progressing Goal: Respiratory complications will improve Outcome: Progressing Goal: Cardiovascular complication will be avoided Outcome: Progressing   Problem: Activity: Goal: Risk for activity intolerance will decrease Outcome: Progressing   Problem: Nutrition: Goal: Adequate nutrition will be maintained Outcome: Progressing   Problem: Coping: Goal: Level of anxiety will decrease Outcome: Progressing   Problem: Elimination: Goal: Will not experience complications related to bowel motility Outcome: Progressing Goal: Will not experience complications related to urinary  retention Outcome: Progressing   Problem: Pain Managment: Goal: General experience of comfort will improve and/or be controlled Outcome: Progressing   Problem: Safety: Goal: Ability to remain free from injury will improve Outcome: Progressing   Problem: Skin Integrity: Goal: Risk for impaired skin integrity will decrease Outcome: Progressing   Problem: Education: Goal: Ability to describe self-care measures that may prevent or decrease complications (Diabetes Survival Skills Education) will improve Outcome: Progressing Goal: Individualized Educational Video(s) Outcome: Progressing   Problem: Coping: Goal: Ability to adjust to condition or change in health will improve Outcome: Progressing   Problem: Fluid Volume: Goal: Ability to maintain a balanced intake and output will improve Outcome: Progressing   Problem: Health Behavior/Discharge Planning: Goal: Ability to identify and utilize available resources and services will improve Outcome: Progressing Goal: Ability to manage health-related needs will improve Outcome: Progressing   Problem: Metabolic: Goal: Ability to maintain appropriate glucose levels will improve Outcome: Progressing   Problem: Nutritional: Goal: Maintenance of adequate nutrition will improve Outcome: Progressing Goal: Progress toward achieving an optimal weight will improve Outcome: Progressing   Problem: Skin Integrity: Goal: Risk for impaired skin integrity will decrease Outcome: Progressing   Problem: Tissue Perfusion: Goal: Adequacy of tissue perfusion will improve Outcome: Progressing

## 2024-03-02 NOTE — Plan of Care (Signed)
  Problem: Education: Goal: Knowledge of the procedure and recovery process will improve Outcome: Adequate for Discharge   Problem: Bowel/Gastric: Goal: Gastrointestinal status for postoperative course will improve Outcome: Adequate for Discharge   Problem: Pain Management: Goal: General experience of comfort will improve Outcome: Adequate for Discharge   Problem: Skin Integrity: Goal: Demonstration of wound healing without infection will improve Outcome: Adequate for Discharge   Problem: Urinary Elimination: Goal: Ability to avoid or minimize complications of infection will improve Outcome: Adequate for Discharge Goal: Ability to achieve and maintain urine output will improve Outcome: Adequate for Discharge Goal: Home care management will improve Outcome: Adequate for Discharge   Problem: Education: Goal: Knowledge of General Education information will improve Description: Including pain rating scale, medication(s)/side effects and non-pharmacologic comfort measures Outcome: Adequate for Discharge   Problem: Health Behavior/Discharge Planning: Goal: Ability to manage health-related needs will improve Outcome: Adequate for Discharge   Problem: Clinical Measurements: Goal: Ability to maintain clinical measurements within normal limits will improve Outcome: Adequate for Discharge Goal: Will remain free from infection Outcome: Adequate for Discharge Goal: Diagnostic test results will improve Outcome: Adequate for Discharge Goal: Respiratory complications will improve Outcome: Adequate for Discharge Goal: Cardiovascular complication will be avoided Outcome: Adequate for Discharge   Problem: Activity: Goal: Risk for activity intolerance will decrease Outcome: Adequate for Discharge   Problem: Nutrition: Goal: Adequate nutrition will be maintained Outcome: Adequate for Discharge   Problem: Coping: Goal: Level of anxiety will decrease Outcome: Adequate for Discharge    Problem: Elimination: Goal: Will not experience complications related to bowel motility Outcome: Adequate for Discharge Goal: Will not experience complications related to urinary retention Outcome: Adequate for Discharge   Problem: Pain Managment: Goal: General experience of comfort will improve and/or be controlled Outcome: Adequate for Discharge   Problem: Safety: Goal: Ability to remain free from injury will improve Outcome: Adequate for Discharge   Problem: Skin Integrity: Goal: Risk for impaired skin integrity will decrease Outcome: Adequate for Discharge   Problem: Education: Goal: Ability to describe self-care measures that may prevent or decrease complications (Diabetes Survival Skills Education) will improve Outcome: Adequate for Discharge Goal: Individualized Educational Video(s) Outcome: Adequate for Discharge   Problem: Coping: Goal: Ability to adjust to condition or change in health will improve Outcome: Adequate for Discharge   Problem: Fluid Volume: Goal: Ability to maintain a balanced intake and output will improve Outcome: Adequate for Discharge   Problem: Health Behavior/Discharge Planning: Goal: Ability to identify and utilize available resources and services will improve Outcome: Adequate for Discharge Goal: Ability to manage health-related needs will improve Outcome: Adequate for Discharge   Problem: Metabolic: Goal: Ability to maintain appropriate glucose levels will improve Outcome: Adequate for Discharge   Problem: Nutritional: Goal: Maintenance of adequate nutrition will improve Outcome: Adequate for Discharge Goal: Progress toward achieving an optimal weight will improve Outcome: Adequate for Discharge   Problem: Skin Integrity: Goal: Risk for impaired skin integrity will decrease Outcome: Adequate for Discharge   Problem: Tissue Perfusion: Goal: Adequacy of tissue perfusion will improve Outcome: Adequate for Discharge

## 2024-03-02 NOTE — Evaluation (Signed)
 Physical Therapy Brief Evaluation and Discharge Note Patient Details Name: Robert [name] MRN: [id number] DOB: 06/11/1951 Today's Date: 03/02/2024   History of Present Illness  Pt is a 73yo male presenting from his ID physician to present to the ED due to elevated creatinine admitted for AKI. Stent exchanged at bedside. PMH: hx of bladder cancer, HLD, HTN, DM, PAF, solitary L kidney s/p ureterostomy LLQ.  Clinical Impression  Patient evaluated by Physical Therapy with no further acute PT needs identified. All education has been completed and the patient has no further questions. Pt mod IND for bed mobility and transfers, SUP for ambulation in hallway with SPC. Educated on slowly building back to PLOF and activity tolerance, pt verbalized understanding.  See below for any follow-up Physical Therapy or equipment needs. PT is signing off. Thank you for this referral.       PT Assessment All further PT needs can be met in the next venue of care  Assistance Needed at Discharge  None    Equipment Recommendations None recommended by PT  Recommendations for Other Services       Precautions/Restrictions Precautions Precautions: None Restrictions Weight Bearing Restrictions Per Provider Order: No        Mobility  Bed Mobility Rolling: Modified independent (Device/Increase time) Supine/Sidelying to sit: Modified independent (Device/Increased time) Sit to supine/sidelying: Modified independent (Device/Increased time)    Transfers Overall transfer level: Modified independent Equipment used: Straight cane                    Ambulation/Gait Ambulation/Gait assistance: Supervision Gait Distance (Feet): 150 Feet Assistive device: Straight cane Gait Pattern/deviations: WFL(Within Functional Limits) Gait Speed: Below normal General Gait Details: Pt ambulated with SPC in LUE with occasional use of railing with RUE, mild ML sway, no overt LOB ntoed nor physical assist  required  Home Activity Instructions    Stairs            Modified Rankin (Stroke Patients Only)        Balance Overall balance assessment: Mild deficits observed, not formally tested Sitting-balance support: Feet supported, No upper extremity supported Sitting balance-Leahy Scale: Good     Standing balance support: Reliant on assistive device for balance, Single extremity supported, During functional activity Standing balance-Leahy Scale: Fair            Pertinent Vitals/Pain   Pain Assessment Pain Assessment: No/denies pain     Home Living Family/patient expects to be discharged to:: Private residence Living Arrangements: Spouse/significant other Available Help at Discharge: Family;Available 24 hours/day Home Environment: Rail - left;Stairs to enter  [employer] of Steps: 3 Home Equipment: Cane - single point        Prior Function Level of Independence: Independent with assistive device(s)      UE/LE Assessment   UE ROM/Strength/Tone/Coordination: WFL    LE ROM/Strength/Tone/Coordination: WFL (noted R hip weakness especially into flexion 3+/5)      Communication   Communication Communication: No apparent difficulties     Cognition Overall Cognitive Status: Appears within functional limits for tasks assessed/performed       General Comments      Exercises     Assessment/Plan    PT Problem List Decreased activity tolerance;Decreased balance       PT Visit Diagnosis Unsteadiness on feet (R26.81)    No Skilled PT     Co-evaluation                AMPAC 6 Clicks Help needed  turning from your back to your side while in a flat bed without using bedrails?: None Help needed moving from lying on your back to sitting on the side of a flat bed without using bedrails?: None Help needed moving to and from a bed to a chair (including a wheelchair)?: None Help needed standing up from a chair using your arms (e.g., wheelchair or  bedside chair)?: None Help needed to walk in hospital room?: None Help needed climbing 3-5 steps with a railing? : A Little 6 Click Score: 23      End of Session Equipment Utilized During Treatment: Gait belt Activity Tolerance: Patient tolerated treatment well;No increased pain Patient left: in chair;with call bell/phone within reach Nurse Communication: Mobility status PT Visit Diagnosis: Unsteadiness on feet (R26.81)     Time: 7829-5621 PT Time Calculation (min) (ACUTE ONLY): 23 min  Charges:   PT Evaluation $PT Eval Low Complexity: 1 Low PT Treatments $Gait Training: 8-22 mins    Jerrye Mori, PT, DPT [facility] Office: [phone]  Jerrye Mori  03/02/2024, 12:32 PM

## 2024-03-02 NOTE — Discharge Summary (Signed)
 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 Patient reported that he had mild chills 4 to 5 days ago so he presented to his infectious disease doctor for further evaluation.  Labs were checked and he received a call from the ID office to go to the ED due to elevated creatinine from 1.62 weeks ago to 5.38/  In ED, patient was afebrile, vital signs stable, creatinine 6.05  CT renal study shows a solitary left kidney with mild to moderate hydronephrosis with stent retracted to the mid ureter. Urology was consulted and recommended admission at [facility] for further evaluation   Assessment and Plan:   Acute kidney injury - Found to have significant rise in creatinine from 1.62 on 02/15/2019 5-6.05 on admission.  - CT showed left-sided hydronephrosis with retraction of the left ureteral stent to the mid ureter - AKI most likely secondary to the above - Urology was consulted - Underwent stent exchange at the bedside on 5/9 with brisk drainage of clear yellow urine  through the sideport of the stent.  KUB confirmed appropriate positioning -Renal function improving, creatinine 3.75, baseline 1.4-1.6 -Will continue IV fluid hydration, recheck creatinine later today.  If continues to improve, will DC home today Addendum:  Cr impropved to 2.9 with IVF today, discharge home. Cleared by urology.    Metastatic bladder cancer - Underwent robotic cystoprostatectomy, right nephro ureterectomy, total ureterectomy, and urinary diversion with left cutaneous ureterostomy in February 2025. - Followed closely by urology and oncology - Recently started adjuvant nivolumab  infusion    Paroxysmal A-fib - HR stable in the 60s to 80s, continue Toprol -XL - Continue  Eliquis   Diabetes mellitus type 2, NIDDM - A1c 7.1% 2 weeks ago - Continue glipizide  - Sliding scale insulin  while inpatient   Essential HTN - BP elevated with SBP in the 140s to 160s - Continue amlodipine  and Toprol -XL    HLD - Continue atorvastatin        Pain control - [place]  Controlled Substance Reporting System database was reviewed. and patient was instructed, not to drive, operate heavy machinery, perform activities at heights, swimming or participation in water  activities or provide baby-sitting services while on Pain, Sleep and Anxiety Medications; until their outpatient Physician has advised to do so again. Also recommended to not to take more than prescribed Pain, Sleep and Anxiety Medications.  Consultants: Urology Procedures performed: stent was exchanged at the bedside on 03/01/2024 with brisk drainage of clear yellow urine through the side ports of the stent.   Disposition: Home Diet recommendation: Carb modified diet  DISCHARGE MEDICATION: Allergies as of 03/02/2024       Reactions   Lisinopril  Swelling, Other (See  Comments)   Tongue and throat became swollen        Medication List     TAKE these medications    amLODipine  5 MG tablet Commonly known as: NORVASC  Take  1 tablet (5 mg total) by mouth daily.   apixaban  5 MG Tabs tablet Commonly known as: Eliquis  Take 1 tablet (5 mg total) by mouth 2 (two) times daily.   atorvastatin  40 MG tablet Commonly known as: LIPITOR Take 1 tablet (40 mg total) by mouth at bedtime.   B-complex with vitamin C tablet Take 1 tablet by mouth 3 (three) times a week.   EPINEPHrine  0.3 mg/0.3 mL Soaj injection Commonly known as: EPI-PEN Inject 0.3 mg into the muscle as needed for anaphylaxis.   glipiZIDE  5 MG tablet Commonly known as: GLUCOTROL  Take 2.5 mg by mouth daily before breakfast.   glucose blood test strip 1 each by Other route daily. Use daily for glucose control - Dx 250.00   metoprolol  succinate 25 MG 24 hr tablet Commonly known as: TOPROL -XL Take 1 tablet (25 mg total) by mouth daily.   Ozempic  (1 MG/DOSE) 4 MG/3ML Sopn Generic drug: Semaglutide  (1 MG/DOSE) INJECT 1 MG UNDER THE SKIN ONCE A WEEK AS DIRECTED What changed: See the new instructions.   propranolol  10 MG tablet Commonly known as: INDERAL  TAKE 1 TABLET BY MOUTH 4 TIMES DAILY AS NEEDED FOR PALPITATIONS, IRREGULAR OR FAST HEART RATE   tadalafil  5 MG tablet Commonly known as: Cialis  Take 1 tablet (5 mg total) by mouth daily as needed for erectile dysfunction.   VITAMIN D-3 PO Take 1 tablet by mouth 3 (three) times a week.        Follow-up Information     Plotnikov, Oakley Bellman, MD. Schedule an appointment as soon as possible for a visit in 2 week(s).   Specialty: Internal Medicine Why: for hospital follow-up Contact information: [address] [place] [address] [phone]         Melody Spurling., MD. Schedule an appointment as soon as possible for a visit in 2 week(s).   Specialty: Urology Why: for hospital follow-up Contact information: [address] [place] [address] [phone]                Discharge Exam: Cleavon Curls Weights   03/01/24 1145  Weight: 94.3 kg   S: Doing well, no acute  pain, nausea vomiting, hoping to go home today.  BP (!) 153/73 (BP Location: Right Arm)   Pulse 68   Temp 97.8 F (36.6 C)   Resp 18   Wt 94.3 kg   SpO2 99%   BMI 26.71 kg/m   Physical Exam General: Alert and oriented x 3, NAD Cardiovascular: S1 S2 clear, RRR.  Respiratory: CTAB, no wheezing, rales or rhonchi Gastrointestinal: Soft, nontender, nondistended, NBS Ext: no pedal edema bilaterally Neuro: no new deficits Psych: Normal affect  GU : Stoma with stent protruding, clear yellow urine   Condition at discharge: fair  The results of significant diagnostics from this hospitalization (including imaging, microbiology, ancillary and laboratory) are listed below for reference.   Imaging Studies: DG Abd 1 View Result Date: 03/01/2024 CLINICAL DATA:  Left ureteral stent placement. EXAM: ABDOMEN - 1 VIEW COMPARISON:  February 05, 2024 FINDINGS: The bowel gas pattern is normal. A left-sided ureteral stent is seen. This is unchanged in position when compared to the prior study. No radio-opaque calculi or other significant radiographic abnormality are seen. Ill-defined surgical sutures are seen  overlying the pelvis. IMPRESSION: Left-sided ureteral stent, as described above. Electronically Signed   By: Virgle Grime M.D.   On: 03/01/2024 21:59   CT Renal Stone Study Result Date: 03/01/2024 CLINICAL DATA:  Bladder, prostate, and renal cell carcinoma. Cystoprostatectomy. Chills yesterday. * Tracking Code: BO * EXAM: CT ABDOMEN AND PELVIS WITHOUT CONTRAST TECHNIQUE: Multidetector CT imaging of the abdomen and pelvis was performed following the standard protocol without IV contrast. RADIATION DOSE REDUCTION: This exam was performed according to the departmental dose-optimization program which includes automated exposure control, adjustment of the mA and/or kV according to patient size and/or use of iterative reconstruction technique. COMPARISON:  02/04/2024 FINDINGS: Lower chest: Smooth septal  thickening at the lung bases. Normal heart size. Right coronary artery calcification. Aortic valve calcification. Trace bilateral pleural thickening. Hepatobiliary: Normal liver. Normal gallbladder, without biliary ductal dilatation. Pancreas: Normal, without mass or ductal dilatation. Spleen: Normal in size, without focal abnormality. Adrenals/Urinary Tract: Bilateral adrenal thickening is unchanged. Right nephrectomy. No left renal calculi. Mild left-sided hydroureteronephrosis again identified. Followed to the level of the left abdominal wall urinary diversion. Interval placement of a catheter or stent within distal most left ureter. Perinephric edema is moderate, increased. Status post cystectomy. Stomach/Bowel: Normal stomach, without wall thickening. Lipoma within the distal duodenum including at 11 mm without complicating obstruction. Scattered colonic diverticula. Normal terminal ileum. Normal appendix. Vascular/Lymphatic: Aortic atherosclerosis. No abdominopelvic adenopathy. Reproductive: Prostatectomy. Other: Trace pelvic fluid, minimally increased. No free intraperitoneal air. Musculoskeletal: Degenerative changes of both hips, greater on the right. Lumbosacral spondylosis. IMPRESSION: 1. Status post right nephrectomy and cystoprostatectomy. 2. Similar mild left-sided hydroureteronephrosis, without obstructive cause. Interval placement of a catheter or stent in the distal most ureter, just proximal to the urinary diversion in the left abdominal wall. 3. Increased left renal/perirenal edema which could all be due to hydronephrosis or superimposed pyelonephritis. 4. Increased small volume pelvic fluid. Development of bibasilar septal thickening. Findings suggest fluid overload or mild congestive heart failure. 5. Aortic valvular calcifications. Consider echocardiography to evaluate for valvular dysfunction. 6. Coronary artery atherosclerosis. Aortic Atherosclerosis (ICD10-I70.0). Electronically Signed   By:  Lore Rode M.D.   On: 03/01/2024 14:00   DG Abd 1 View Result Date: 02/05/2024 CLINICAL DATA:  Left ureteral stent placement. History of bladder cancer with cystectomy and right nephrectomy. Left ureterostomy. EXAM: ABDOMEN - 1 VIEW COMPARISON:  Abdomen and pelvis CT dated 02/04/2024 FINDINGS: Normal bowel-gas pattern. Left ureteral stent with the proximal portion in the region of the left renal pelvis. The stent extends distally and loops back upon itself with the inferior portion of the stent located in the mid abdomen to the left of midline. This is not the course of the ureter on the recent CT. Moderate lumbar spine degenerative changes. Marked right hip degenerative changes. IMPRESSION: Left ureteral stent with the proximal portion in the region of the left renal pelvis. The stent extends distally and loops back upon itself with the inferior portion of the stent located in the mid abdomen to the left of midline. This is not the course of the ureter on the recent CT. This needs to be correlated with the findings at recent stent placement. Electronically Signed   By: Catherin Closs M.D.   On: 02/05/2024 12:08   CT Renal Stone Study Result Date: 02/04/2024 CLINICAL DATA:  Left-sided flank pain for 1 week, initial encounter EXAM: CT ABDOMEN AND PELVIS WITHOUT CONTRAST TECHNIQUE: Multidetector CT imaging of the abdomen and pelvis was performed following the standard  protocol without IV contrast. RADIATION DOSE REDUCTION: This exam was performed according to the departmental dose-optimization program which includes automated exposure control, adjustment of the mA and/or kV according to patient size and/or use of iterative reconstruction technique. COMPARISON:  09/01/2023 FINDINGS: Lower chest: No acute abnormality. Hepatobiliary: No focal liver abnormality is seen. No gallstones, gallbladder wall thickening, or biliary dilatation. Pancreas: Unremarkable. No pancreatic ductal dilatation or surrounding  inflammatory changes. Spleen: Normal in size without focal abnormality. Adrenals/Urinary Tract: Adrenal glands are mildly hyperplastic bilaterally. 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 No obstructing stone is noted. These changes are new from the prior exam. Bladder has been surgically removed. Stomach/Bowel: Scattered diverticular change of the colon is noted. No evidence of diverticulitis is seen. The appendix is within normal limits. Stomach and small bowel are unremarkable. Vascular/Lymphatic: Aortic atherosclerosis. No enlarged abdominal or pelvic lymph nodes. Reproductive: Prostate has been surgically removed. Other: No abdominal wall hernia or abnormality. No abdominopelvic ascites. Musculoskeletal: Degenerative changes of the lumbar spine and right hip are seen. IMPRESSION: Left-sided hydronephrosis without focal calculus. This extends all the way to the level of the urostomy. Diverticulosis without diverticulitis. Postoperative changes consistent with cystoprostatectomy. Electronically Signed   By: Violeta Grey M.D.   On: 02/04/2024 19:23    Microbiology: Results for orders placed or performed in visit on 02/29/24  Blood culture (routine single)     Status: None (Preliminary result)   Collection Time: 02/29/24 11:35 AM   Specimen: Blood  Result Value Ref Range Status   MICRO NUMBER: [id number]  Preliminary   SPECIMEN QUALITY: Suboptimal  Preliminary   Source BLOOD 2  Preliminary   STATUS: PRELIMINARY  Preliminary   Result:   Preliminary    No growth to date. Culture is continuously monitored for a total of 120 hours incubation. A change in status will result in a phone report followed by an updated printed culture report. Inspection of blood culture bottles indicates that an inadequate  volume of blood may have been collected for the  detection of sepsis.    COMMENT: Aerobic and anaerobic bottle received.  Preliminary  Blood culture (routine single)     Status: None (Preliminary result)   Collection Time: 02/29/24 11:38 AM   Specimen: Blood  Result Value Ref Range Status   MICRO NUMBER: [id number]  Preliminary   SPECIMEN QUALITY: Suboptimal  Preliminary   Source BLOOD 2  Preliminary   STATUS: PRELIMINARY  Preliminary   Result:   Preliminary    No growth to date. Culture is continuously monitored for a total of 120 hours incubation. A change in status will result in a phone report followed by an updated printed culture report. Inspection of blood culture bottles indicates that an inadequate  volume of blood may have been collected for the detection of sepsis.    COMMENT: Aerobic and anaerobic bottle received.  Preliminary    Labs: CBC: Recent Labs  Lab 02/29/24 1138 03/01/24 1241 03/02/24 0508  WBC 7.2 8.5 7.0  NEUTROABS  --  6.3  --   HGB 9.6* 9.4* 8.3*  HCT 29.5* 28.0* 25.3*  MCV 85.5 84.3 86.3  PLT 144 160 130*   Basic Metabolic Panel: Recent Labs  Lab 02/29/24 1138 03/01/24 1241 03/02/24 0508 03/02/24 1437  NA 139 138 137 139  K 4.8 4.9 4.1 4.4  CL 107 104 108 107  CO2 21 20* 18* 24  GLUCOSE 133* 124* 109* 160*  BUN 77* 74* 62* 53*  CREATININE 5.38* 6.05* 3.75* 2.91*  CALCIUM  9.3 9.8 8.6* 8.8*  PHOS  --   --  4.7*  --    Liver Function Tests: Recent Labs  Lab 02/29/24 1138 03/02/24 0508  AST 7*  --   ALT 9  --   BILITOT 0.4  --   PROT 6.6  --   ALBUMIN  --  3.3*   CBG: Recent Labs  Lab 03/02/24 0014 03/02/24 0503 03/02/24 0744 03/02/24 1146  GLUCAP 100* 111* 114* 194*    Discharge time spent: greater than 30 minutes.  Signed: Bertram Brocks, MD [facility] 03/02/2024

## 2024-03-02 NOTE — Progress Notes (Signed)
   Subjective Feels well this morning, no complaints  Physical Exam: BP (!) 165/84 (BP Location: Right Arm)   Pulse 68   Temp 97.9 F (36.6 C) (Oral)   Resp 16   Wt 94.3 kg   SpO2 99%   BMI 26.71 kg/m    Constitutional:  Alert and oriented, No acute distress. Respiratory: Normal respiratory effort, no increased work of breathing. GI: Abdomen is soft, non-tender, non-distended Stoma with stent protruding, clear yellow urine  Laboratory Data: Reviewed, creatinine down to 3.75(eGFR 16) from 6.05, eGFR 9  Pertinent Imaging: I personally viewed and interpreted the KUB yesterday that shows stent in appropriate position  Assessment & Plan:   73 year old male with metastatic urothelial cell cancer who has been managed by Dr. Secundino Dach, underwent cystoprostatectomy, total urethrectomy, and right nephroureterectomy-he has a solitary left kidney with a cutaneous ureterostomy that has been managed with stent.  During routine blood work was found to have AKI with a creatinine of 6 from baseline of 1.5 and he was sent to ER for imaging, CT showed left-sided hydronephrosis with retraction of the left ureteral stent to the mid ureter.  His stent was exchanged at the bedside on 03/01/2024 with brisk drainage of clear yellow urine through the side ports of the stent.  KUB confirmed appropriate positioning.  Renal function is improving.  Can discharge from urology perspective Will coordinate follow-up with Dr. Kit Peri, MD

## 2024-03-04 ENCOUNTER — Encounter: Payer: Self-pay | Admitting: Infectious Diseases

## 2024-03-04 ENCOUNTER — Other Ambulatory Visit: Payer: Self-pay

## 2024-03-04 ENCOUNTER — Ambulatory Visit: Admitting: Infectious Diseases

## 2024-03-04 VITALS — BP 150/83 | HR 86 | Temp 97.5°F | Ht 74.0 in | Wt 200.0 lb

## 2024-03-04 DIAGNOSIS — A488 Other specified bacterial diseases: Secondary | ICD-10-CM

## 2024-03-04 DIAGNOSIS — R6883 Chills (without fever): Secondary | ICD-10-CM

## 2024-03-04 DIAGNOSIS — N179 Acute kidney failure, unspecified: Secondary | ICD-10-CM | POA: Diagnosis not present

## 2024-03-04 DIAGNOSIS — Z95828 Presence of other vascular implants and grafts: Secondary | ICD-10-CM

## 2024-03-04 NOTE — Progress Notes (Unsigned)
 Patient Active Problem List   Diagnosis Date Noted   AKI (acute kidney injury) (HCC) 03/01/2024   Serratia marcescens infection 03/01/2024   Chills 02/29/2024   Medication management 02/29/2024   Hydronephrosis of left kidney 02/05/2024   UTI (urinary tract infection) 02/05/2024   Bacteremia 02/04/2024   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  10/09/2023   Hypomagnesemia 07/03/2023   Port-A-Cath in place 06/09/2023   Elevated PSA measurement 02/28/2023   Preop examination 02/27/2023   Osteoarthritis of right hip 06/29/2022   Bladder cancer (HCC) 01/31/2022   Primary osteoarthritis of right hip 10/19/2021   Long term current use of anticoagulant -Eliquis  03/12/2019   Paroxysmal atrial fibrillation (HCC) 03/12/2019   Overweight (BMI 25.0-29.9) 01/03/2019   Type 2 diabetes mellitus without complication, without long-term current use of insulin  (HCC) 07/23/2018   BPH associated with nocturia 03/01/2017   Irregular heart rate 06/23/2011   Hx of colonic polyps    ERECTILE DYSFUNCTION 12/19/2008   SKIN CANCER, HX OF 12/19/2008   Hyperlipidemia 08/21/2007   Essential hypertension 08/21/2007    Patient's Medications  New Prescriptions   No medications on file  Previous Medications   AMLODIPINE  (NORVASC ) 5 MG TABLET    Take 1 tablet (5 mg total) by mouth daily.   APIXABAN  (ELIQUIS ) 5 MG TABS TABLET    Take 1 tablet (5 mg total) by mouth 2 (two) times daily.   ATORVASTATIN  (LIPITOR) 40 MG TABLET    Take 1 tablet (40 mg total) by mouth at bedtime.   B COMPLEX-C (B-COMPLEX WITH VITAMIN C) TABLET    Take 1 tablet by mouth 3 (three) times a week.   CHOLECALCIFEROL (VITAMIN D-3 PO)    Take 1 tablet by mouth 3 (three) times a week.   EPINEPHRINE  0.3 MG/0.3 ML IJ SOAJ INJECTION    Inject 0.3 mg into the muscle as needed for anaphylaxis.   GLIPIZIDE  (GLUCOTROL ) 5 MG TABLET    Take 2.5 mg by mouth daily before breakfast.   GLUCOSE BLOOD TEST STRIP    1 each by Other route daily. Use daily for glucose  control - Dx 250.00   METOPROLOL  SUCCINATE (TOPROL -XL) 25 MG 24 HR TABLET    Take 1 tablet (25 mg total) by mouth daily.   PROPRANOLOL  (INDERAL ) 10 MG TABLET    TAKE 1 TABLET BY MOUTH 4 TIMES DAILY AS NEEDED FOR PALPITATIONS, IRREGULAR OR FAST HEART RATE   SEMAGLUTIDE , 1 MG/DOSE, (OZEMPIC , 1 MG/DOSE,) 4 MG/3ML SOPN    INJECT 1 MG UNDER THE SKIN ONCE A WEEK AS DIRECTED   TADALAFIL  (CIALIS ) 5 MG TABLET    Take 1 tablet (5 mg total) by mouth daily as needed for erectile dysfunction.  Modified Medications   No medications on file  Discontinued Medications   No medications on file    Subjective: Discussed the use of AI scribe software for clinical note transcription with the patient, who gave verbal consent to proceed.   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 Underwent bedside stent placement by  Urology on 4/14. Initially on IV cefepime  and PO amoxicillin  which was transitioned to PO ciprofloxacin  and PO amoxicillin  during discharge to complete 2 weeks course through 02/18/24   5/8 Seen by endocrinology 4/22, PCP 4/23 and Oncology 4/24. Plan to start Nivolumab  noted on May 20 th. He reports completion of antibiotics as prescribed without missing doses or concerns. However, reports chills for last 4 to 5 days, primarily when sitting, without associated shaking. Temperature readings have been 74F in the morning and 98.40F recently, with no fever noted. He denies  nausea, vomiting, diarrhea, abdominal pain, cough, chest pain, or shortness of breath. No concerns at port site, urostomy with clear urine. Appetite is good. No rashes, neurological symptoms, peripheral joint pain/swelling.    5/12  Here for FU after recent hospital admission 5/9-5/10 for AKI  2/2 obstructive uropathy 2/2 misplaced ureteral stent. CT abdomen pelvis showed left sided hydronephrosis with retraction of the left ureteral stent  to the mid ureter. S/p stent exchange by Urology on 5/9 after which improvement of Cr with IVF. Did not need antibiotics.   No concerns today, mild chills but thinks his blood thinner might be causing it. He thinks he might have pulled the stent from the ostomy while adjusting the ostomy leading to displacement and will be careful next time. Denies fevers, chills, GU symptoms, malaise or other concerns   Review of Systems: all systems reviewed with pertinent positives and negatives as listed above   Past Medical History:  Diagnosis Date   Anticoagulant long-term use    eliquis --- managed by cardiology   Arthritis    Bladder cancer [facility])    urologist--- dr Robert [name];  overlapping   Cataract    History of basal cell carcinoma (BCC) excision    History of COVID-19 05/2019   per pt mild symptoms that resolved   HLD (hyperlipidemia)    HTN (hypertension)    Hx of colonic polyps    Insulin  dependent type 2 diabetes mellitus [facility])    endocrinologist--- dr Robert [name] --  (03-11-2022 per pt check blood sugar 1-2 times daily,  fasting sugar-- 118-120s)   Nocturia    Paroxysmal atrial fibrillation (HCC) 03/12/2019   cardiologist--- dr Robert [name]   Renal disorder    Ureterocele    Use of cane as ambulatory aid    Past Surgical History:  Procedure Laterality Date   BLEPHAROPLASTY Bilateral    per pt approx  2001;   upper eyelid's   COLONOSCOPY     last one approx 2023   CYSTOSCOPY W/ RETROGRADES N/A 12/08/2023   Procedure: CYSTOSCOPY;  Surgeon: Robert [name]., MD;  Location: [facility];  Service: Urology;  Laterality: N/A;  390 MINUTES NEEDED FOR CASE   CYSTOSCOPY WITH URETEROSCOPY AND STENT PLACEMENT Right 12/02/2022   Procedure: CYSTOSCOPY WITH RIGHT URETEROSCOPY, RIGHT DIAGNOSTIC URETEROSCOPY;  Surgeon: Christina Coyer, MD;  Location: [facility];  Service: Urology;  Laterality: Right;  90 MINS FOR CASE   CYSTOSCOPY/RETROGRADE/URETEROSCOPY Bilateral 05/05/2023   Procedure: CYSTOSCOPY BILATERAL RETROGRADE PYELOGRAM RIGHT URETEROSCOPY RIGHT STENT PLACEMENT;  Surgeon: Christina Coyer, MD;  Location[facility];  Service: Urology;  Laterality: Bilateral;  90 MINS FOR CASE   IR IMAGING GUIDED PORT INSERTION  05/30/2023   PATELLAR TENDON REPAIR Left 04/16/2004   @[facility]    ROBOT ASSITED LAPAROSCOPIC NEPHROURETERECTOMY Right 12/08/2023   Procedure: RIGHT ROBOT ASSISTED Nephroureterectomy;  Surgeon: Robert [name]., MD;  Location: [facility];  Service: Urology;  Laterality: Right;   ROBOT  LAP RADICAL CYSTOPROSTATECTOMY PELVIC LYMPHADENECTOMY, NEOBLADDER  12/08/2023   Procedure: ROBOT ASSISTED LAPAROSCOPIC RADICAL CYSTOPROSTATECTOMY;  Surgeon: Robert [name]., MD;  Location: [facility];  Service: Urology;;   TOTAL URETHRECTOMY,RADICAL N/A 12/08/2023   Procedure: TOTAL URETHRECTOMY,RADICAL;  Surgeon: Robert [name]., MD;  Location: [facility];  Service: Urology;  Laterality: N/A;   TRANSURETHRAL RESECTION OF BLADDER TUMOR N/A 06/18/2021   Procedure: TRANSURETHRAL RESECTION OF BLADDER TUMOR (TURBT) WITH RIGHT URETERAL STENT PLACEMENT, RIGHT URETEROSCOPY WITH DISTRACTION OF TUMOR, FULGURATION;  Surgeon: Christina Coyer, MD;  Location: [facility];  Service: Urology;  Laterality: N/A;   TRANSURETHRAL RESECTION OF BLADDER TUMOR N/A 03/15/2022   Procedure: TRANSURETHRAL RESECTION OF BLADDER TUMOR (TURBT) WITH BILATERAL URETEROSCOPY/URETHRAL/ DILATION/BILATERAL RETROGRADE PYELOGRAM/BIOPSY AND FULGURATION OF RIGHT URETERAL CANCER/RIGHT STENT  PLACEMENT;  Surgeon: Christina Coyer, MD;  Location: [facility];  Service: Urology;  Laterality: N/A;   TRANSURETHRAL RESECTION OF BLADDER TUMOR N/A 05/05/2023   Procedure: TRANSURETHRAL RESECTION OF BLADDER TUMOR (TURBT) BLADDER BIOSPY;  Surgeon: Christina Coyer, MD;  Location: [facility];  Service: Urology;  Laterality: N/A;   TRANSURETHRAL RESECTION OF BLADDER TUMOR WITH MITOMYCIN -C N/A 12/02/2022   Procedure: TRANSURETHRAL RESECTION OF BLADDER TUMOR;  Surgeon: Christina Coyer, MD;  Location: [facility];  Service: Urology;  Laterality: N/A;   TRANSURETHRAL RESECTION OF PROSTATE  05/05/2023   Procedure: TRANSURETHRAL RESECTION OF THE PROSTATE (TURP);  Surgeon: Christina Coyer, MD;  Location: [facility];  Service: Urology;;    Social History   Tobacco Use   Smoking status: Some Days    Current packs/day: 0.00    Types: Cigars, Cigarettes    Start date: 2012    Last attempt to quit: 02/22/2011    Years since quitting: 13.0   Smokeless tobacco: Never  Vaping Use   Vaping status: Never Used  Substance Use Topics   Alcohol  use: Yes    Alcohol /week: 3.0 standard drinks of alcohol     Types: 3 Shots of liquor per week    Comment: 6-8oz /wk OCC MARTINI 2 TO 3 X WEEK   Drug use: Never    Family History  Problem Relation Age of Onset   Pancreatic cancer Mother 76   Cancer Paternal Uncle        unknown type cancer   Aortic aneurysm Other        family history   Cancer Other        Breast (1st degree relative)   Colon cancer Neg Hx    Rectal cancer Neg Hx    Stomach cancer Neg Hx    Esophageal cancer Neg Hx     Allergies  Allergen Reactions   Lisinopril  Swelling and Other (See Comments)    Tongue and throat became swollen    Health Maintenance  Topic Date Due   DTaP/Tdap/Td (2 - Tdap) 08/20/2017   OPHTHALMOLOGY EXAM  12/25/2022   COVID-19 Vaccine (4 - 2024-25 season) 06/25/2023   Medicare Annual Wellness (AWV)  03/30/2024    Hepatitis C Screening  03/30/2024 (Originally 08/22/1969)   INFLUENZA VACCINE  05/24/2024   FOOT EXAM  06/11/2024   HEMOGLOBIN A1C  08/14/2024   Diabetic kidney evaluation - Urine ACR  02/12/2025   Diabetic kidney evaluation - eGFR measurement  03/02/2025   Colonoscopy  06/18/2027   Pneumonia Vaccine 69+ Years old  Completed   HPV VACCINES  Aged Out   Meningococcal B Vaccine  Aged Out   Zoster Vaccines- Shingrix  Discontinued  Objective: BP (!) 150/83   Pulse 86   Temp (!) 97.5 F (36.4 C) (Oral)   Ht 6\' 2"  (1.88 m)   Wt 200 lb (90.7 kg)   SpO2 99%   BMI 25.68 kg/m   Physical Exam Constitutional:      Appearance: Normal appearance.  HENT:     Head: Normocephalic and atraumatic.      Mouth: Mucous membranes are moist.  Eyes:    Conjunctiva/sclera: Conjunctivae normal.     Pupils: Pupils are equal, round, and b/l symmetrical  Cardiovascular:     Rate and Rhythm: Normal rate and Irregular rhythm.     Heart sounds:   Pulmonary:     Effort: Pulmonary effort is normal.     Breath sounds:  Abdominal:     General: Non distended     Palpations: LLQ ostomy OK  Musculoskeletal:        General: Normal range of motion.   Skin:    General: Skin is warm and dry.     Comments: RT chest port with no concerns   Neurological:     General: grossly non focal     Mental Status: awake, alert and oriented to person, place, and time.   Psychiatric:        Mood and Affect: Mood normal.   Lab Results Lab Results  Component Value Date   WBC 7.0 03/02/2024   HGB 8.3 (L) 03/02/2024   HCT 25.3 (L) 03/02/2024   MCV 86.3 03/02/2024   PLT 130 (L) 03/02/2024    Lab Results  Component Value Date   CREATININE 2.91 (H) 03/02/2024   BUN 53 (H) 03/02/2024   NA 139 03/02/2024   K 4.4 03/02/2024   CL 107 03/02/2024   CO2 24 03/02/2024    Lab Results  Component Value Date   ALT 9 02/29/2024   AST 7 (L) 02/29/2024   ALKPHOS 83 02/15/2024   BILITOT 0.4 02/29/2024    Lab  Results  Component Value Date   CHOL 116 02/27/2023   HDL 38.60 (L) 02/27/2023   LDLCALC 47 02/27/2023   LDLDIRECT 77.0 07/07/2015   TRIG 150.0 (H) 02/27/2023   CHOLHDL 3 02/27/2023   No results found for: "LABRPR", "RPRTITER" No results found for: "HIV1RNAQUANT", "HIV1RNAVL", "CD4TABS"   Microbiology Results for orders placed or performed in visit on 02/29/24  Blood culture (routine single)     Status: None (Preliminary result)   Collection Time: 02/29/24 11:35 AM   Specimen: Blood  Result Value Ref Range Status   MICRO NUMBER: [id number]  Preliminary   SPECIMEN QUALITY: Suboptimal  Preliminary   Source BLOOD 2  Preliminary   STATUS: PRELIMINARY  Preliminary   Result:   Preliminary    No growth to date. Culture is continuously monitored for a total of 120 hours incubation. A change in status will result in a phone report followed by an updated printed culture report. Inspection of blood culture bottles indicates that an inadequate  volume of blood may have been collected for the detection of sepsis.    COMMENT: Aerobic and anaerobic bottle received.  Preliminary  Blood culture (routine single)     Status: None (Preliminary result)   Collection Time: 02/29/24 11:38 AM   Specimen: Blood  Result Value Ref Range Status   MICRO NUMBER: [id number]  Preliminary   SPECIMEN QUALITY: Suboptimal  Preliminary   Source BLOOD 2  Preliminary   STATUS: PRELIMINARY  Preliminary   Result:   Preliminary  No growth to date. Culture is continuously monitored for a total of 120 hours incubation. A change in status will result in a phone report followed by an updated printed culture report. Inspection of blood culture bottles indicates that an inadequate  volume of blood may have been collected for the detection of sepsis.    COMMENT: Aerobic and anaerobic bottle received.  Preliminary   Imaging DG Abd 1 View Result Date: 03/01/2024 CLINICAL DATA:  Left ureteral stent placement. EXAM: ABDOMEN - 1  VIEW COMPARISON:  February 05, 2024 FINDINGS: The bowel gas pattern is normal. A left-sided ureteral stent is seen. This is unchanged in position when compared to the prior study. No radio-opaque calculi or other significant radiographic abnormality are seen. Ill-defined surgical sutures are seen overlying the pelvis. IMPRESSION: Left-sided ureteral stent, as described above. Electronically Signed   By: Virgle Grime M.D.   On: 03/01/2024 21:59   CT Renal Stone Study Result Date: 03/01/2024 CLINICAL DATA:  Bladder, prostate, and renal cell carcinoma. Cystoprostatectomy. Chills yesterday. * Tracking Code: BO * EXAM: CT ABDOMEN AND PELVIS WITHOUT CONTRAST TECHNIQUE: Multidetector CT imaging of the abdomen and pelvis was performed following the standard protocol without IV contrast. RADIATION DOSE REDUCTION: This exam was performed according to the departmental dose-optimization program which includes automated exposure control, adjustment of the mA and/or kV according to patient size and/or use of iterative reconstruction technique. COMPARISON:  02/04/2024 FINDINGS: Lower chest: Smooth septal thickening at the lung bases. Normal heart size. Right coronary artery calcification. Aortic valve calcification. Trace bilateral pleural thickening. Hepatobiliary: Normal liver. Normal gallbladder, without biliary ductal dilatation. Pancreas: Normal, without mass or ductal dilatation. Spleen: Normal in size, without focal abnormality. Adrenals/Urinary Tract: Bilateral adrenal thickening is unchanged. Right nephrectomy. No left renal calculi. Mild left-sided hydroureteronephrosis again identified. Followed to the level of the left abdominal wall urinary diversion. Interval placement of a catheter or stent within distal most left ureter. Perinephric edema is moderate, increased. Status post cystectomy. Stomach/Bowel: Normal stomach, without wall thickening. Lipoma within the distal duodenum including at 11 mm without  complicating obstruction. Scattered colonic diverticula. Normal terminal ileum. Normal appendix. Vascular/Lymphatic: Aortic atherosclerosis. No abdominopelvic adenopathy. Reproductive: Prostatectomy. Other: Trace pelvic fluid, minimally increased. No free intraperitoneal air. Musculoskeletal: Degenerative changes of both hips, greater on the right. Lumbosacral spondylosis. IMPRESSION: 1. Status post right nephrectomy and cystoprostatectomy. 2. Similar mild left-sided hydroureteronephrosis, without obstructive cause. Interval placement of a catheter or stent in the distal most ureter, just proximal to the urinary diversion in the left abdominal wall. 3. Increased left renal/perirenal edema which could all be due to hydronephrosis or superimposed pyelonephritis. 4. Increased small volume pelvic fluid. Development of bibasilar septal thickening. Findings suggest fluid overload or mild congestive heart failure. 5. Aortic valvular calcifications. Consider echocardiography to evaluate for valvular dysfunction. 6. Coronary artery atherosclerosis. Aortic Atherosclerosis (ICD10-I70.0). Electronically Signed   By: Lore Rode M.D.   On: 03/01/2024 14:00   DG Abd 1 View Result Date: 02/05/2024 CLINICAL DATA:  Left ureteral stent placement. History of bladder cancer with cystectomy and right nephrectomy. Left ureterostomy. EXAM: ABDOMEN - 1 VIEW COMPARISON:  Abdomen and pelvis CT dated 02/04/2024 FINDINGS: Normal bowel-gas pattern. Left ureteral stent with the proximal portion in the region of the left renal pelvis. The stent extends distally and loops back upon itself with the inferior portion of the stent located in the mid abdomen to the left of midline. This is not the course of the ureter on the recent CT.  Moderate lumbar spine degenerative changes. Marked right hip degenerative changes. IMPRESSION: Left ureteral stent with the proximal portion in the region of the left renal pelvis. The stent extends distally and  loops back upon itself with the inferior portion of the stent located in the mid abdomen to the left of midline. This is not the course of the ureter on the recent CT. This needs to be correlated with the findings at recent stent placement. Electronically Signed   By: Catherin Closs M.D.   On: 02/05/2024 12:08   CT Renal Stone Study Result Date: 02/04/2024 CLINICAL DATA:  Left-sided flank pain for 1 week, initial encounter EXAM: CT ABDOMEN AND PELVIS WITHOUT CONTRAST TECHNIQUE: Multidetector CT imaging of the abdomen and pelvis was performed following the standard protocol without IV contrast. RADIATION DOSE REDUCTION: This exam was performed according to the departmental dose-optimization program which includes automated exposure control, adjustment of the mA and/or kV according to patient size and/or use of iterative reconstruction technique. COMPARISON:  09/01/2023 FINDINGS: Lower chest: No acute abnormality. Hepatobiliary: No focal liver abnormality is seen. No gallstones, gallbladder wall thickening, or biliary dilatation. Pancreas: Unremarkable. No pancreatic ductal dilatation or surrounding inflammatory changes. Spleen: Normal in size without focal abnormality. Adrenals/Urinary Tract: Adrenal glands are mildly hyperplastic bilaterally. 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 No obstructing stone is noted. These changes are new from the prior exam. Bladder has been surgically removed. Stomach/Bowel: Scattered diverticular change of the colon is noted. No evidence of diverticulitis is seen. The appendix is within normal limits. Stomach and small bowel are unremarkable. Vascular/Lymphatic: Aortic atherosclerosis. No enlarged abdominal or pelvic lymph nodes. Reproductive: Prostate has been surgically removed. Other: No abdominal wall hernia or abnormality. No  abdominopelvic ascites. Musculoskeletal: Degenerative changes of the lumbar spine and right hip are seen. IMPRESSION: Left-sided hydronephrosis without focal calculus. This extends all the way to the level of the urostomy. Diverticulosis without diverticulitis. Postoperative changes consistent with cystoprostatectomy. Electronically Signed   By: Violeta Grey M.D.   On: 02/04/2024 19:23    Assessment/Plan # AKI in the setting of obstruction due to misplaced ureteral stent  - 5/9 s/p stent exchange and improvement in Cr - 5/9 CT mild left-sided hydroureteronephrosis, without obstructive cause. Interval placement of a catheter or stent in the distal most ureter, just proximal to the urinary diversion in the left abdominal wall. Increased left renal/perirenal edema which could all be due to hydronephrosis or superimposed pyelonephritis. - no signs and symptoms to suggest UTI - Fu as needed   # Serratia marcescens bacteremia likely urinary source  # Complicated UTI  - possible GU source  - s/p completion of abtx on 4/27 - 5/8 surveillance blood cx negative, port can stay in    # Baldder ca  - Per Oncology   I have personally spent 30 minutes involved in face-to-face and non-face-to-face activities for this patient on the day of the visit. Professional time spent includes the following activities: Preparing to see the patient (review of tests), Obtaining and/or reviewing separately obtained history (admission/discharge record), Performing a medically appropriate examination and/or evaluation , Ordering medications/tests/procedures, referring and communicating with other health care professionals, Documenting clinical information in the EMR, Independently interpreting results (not separately reported), Communicating results to the patient/family/caregiver, Counseling and educating the patient/family/caregiver and Care coordination (not separately reported).   Of note, portions of this  note may have  been created with voice recognition software. While this note has been edited for accuracy, occasional wrong-word or 'sound-a-like' substitutions may have occurred due to the inherent limitations of voice recognition software.   Melvina Stage, MD [facility] 03/04/2024, 3:46 PM

## 2024-03-05 ENCOUNTER — Ambulatory Visit: Admitting: Internal Medicine

## 2024-03-05 ENCOUNTER — Other Ambulatory Visit: Payer: Self-pay | Admitting: *Deleted

## 2024-03-05 NOTE — Transitions of Care (Post Inpatient/ED Visit) (Signed)
 Transition of Care week # 5/ day # 26  Visit Note  03/05/2024  Name: Robert [name] MRN: [id number]          DOB: 11/10/1950  Situation: Patient enrolled in [facility] 30-day program. Visit completed with patient by telephone.   HIPAA identifiers x 2 verified  Background:  Recent hospital visit April 13-16, 2025 for fever/ chills- UTI bacteremia/ hydronephrosis with placement of (L) ureteral stent in setting of baseline bladder CA with urostomy since February 2025 Metastatic bladder cancer s/p cystoprostatectomy, total ureterectomy and right nephroureterectomy and solitary left kidney s/p ureterostomy  ED/ hospital observation admission 03/01/24- stent migration with replacement  Initial Transition Care Management Follow-up Telephone Call    Past Medical History:  Diagnosis Date   Anticoagulant long-term use    eliquis --- managed by cardiology   Arthritis    Bladder cancer [facility])    urologist--- dr Derrick Fling;  overlapping   Cataract    History of basal cell carcinoma (BCC) excision    History of COVID-19 05/2019   per pt mild symptoms that resolved   HLD (hyperlipidemia)    HTN (hypertension)    Hx of colonic polyps    Insulin  dependent type 2 diabetes mellitus [facility])    endocrinologist--- dr Aldona Amel --  (03-11-2022 per pt check blood sugar 1-2 times daily,  fasting sugar-- 118-120s)   Nocturia    Paroxysmal atrial fibrillation (HCC) 03/12/2019   cardiologist--- dr Floria Hurst   Renal disorder    Ureterocele    Use of cane as ambulatory aid    Assessment:  "I am doing fine since my overnight hospital visit when they replaced the stent.  I have an appointment with the urology doctor- can't remember exactly when but it is scheduled.  Saw the ID doctor yesterday and she said the infection is resolved and she does not need to see me anymore unless something else comes up in the future.  I am back to normal and feeling good now"    Denies clinical concerns and sounds to be in no  distress throughout [facility] 30-day program outreach call today  Patient Reported Symptoms: Cognitive Cognitive Status: Alert and oriented to person, place, and time, Insightful and able to interpret abstract concepts, Normal speech and language skills Cognitive/Intellectual Conditions Management [RPT]: None reported or documented in medical history or problem list   Health Maintenance Behaviors: Annual physical exam, Stress management  Neurological Neurological Review of Symptoms: No symptoms reported    HEENT HEENT Symptoms Reported: No symptoms reported Other HEENT Symptoms/Conditions: Reports resolution of previously reported nasal congestion and fevers HEENT Management Strategies: Routine screening    Cardiovascular Cardiovascular Symptoms Reported: No symptoms reported Does patient have uncontrolled Hypertension?: No Cardiovascular Conditions: Hypertension Cardiovascular Management Strategies: Medication therapy, Routine screening, Diet modification  Respiratory Respiratory Symptoms Reported: No symptoms reported Other Respiratory Symptoms: Denies shortness of breat/ cough; sounds to be in no respiratory distress throughout [facility] call today    Endocrine Patient reports the following symptoms related to hypoglycemia or hyperglycemia : No symptoms reported Is patient diabetic?: Yes Is patient checking blood sugars at home?: Yes Endocrine Conditions: Diabetes Endocrine Management Strategies: Medication therapy, Diet modification, Routine screening, Coping strategies  Gastrointestinal Gastrointestinal Symptoms Reported: No symptoms reported Additional Gastrointestinal Details: Reports "good" appetite/ "normal" bowel movements      Genitourinary Genitourinary Symptoms Reported: No symptoms reported Additional Genitourinary Details: Reviewed recent ED visit/ Observation admission: reports since uretal stent replacement- "feel great, no more problems; urine is clear  pale yellow;" continues to  report self-manages urostomy at home without difficulty; continues to verbalize signs/ symptoms UTI along with corresponding action plan without prompting Genitourinary Conditions: Bladder cancer, Chronic kidney disease, Difficulty voiding, Urinary tract infection Genitourinary Management Strategies: Medical device, Medication therapy, Urostomy  Integumentary Integumentary Symptoms Reported: No symptoms reported Additional Integumentary Details: Continues to report skin around porta-cath/ urostomy "looks fine, no redness or swelling" confirms continues to self manage Skin Conditions: Other Skin Management Strategies: Routine screening, Medical device  Musculoskeletal Musculoskelatal Symptoms Reviewed: Unsteady gait Additional Musculoskeletal Details: confirmed continues to use cane prn Musculoskeletal Conditions: Mobility limited, Joint pain Musculoskeletal Management Strategies: Routine screening, Medication therapy      Psychosocial Psychosocial Symptoms Reported: No symptoms reported         There were no vitals filed for this visit.  Medications Reviewed Today     Reviewed by Audi Wettstein M, RN (Registered Nurse) on 03/05/24 at [phone]  Med List Status: <None>   Medication Order Taking? Sig Documenting Provider Last Dose Status Informant  amLODipine  (NORVASC ) 5 MG tablet [id number] No Take 1 tablet (5 mg total) by mouth daily. Sueellen Emery, MD Taking Active Self           Med Note Guido Leeks, [name]   Fri Mar 01, 2024  6:56 PM) Although the patient said he takes this, I cannot see it's been filled since 08/2023  apixaban  (ELIQUIS ) 5 MG TABS tablet [id number] No Take 1 tablet (5 mg total) by mouth 2 (two) times daily. Nahser, Lela Purple, MD Taking Active Self  atorvastatin  (LIPITOR) 40 MG tablet [id number] No Take 1 tablet (40 mg total) by mouth at bedtime. Audria Leather, MD Taking Active Self  B Complex-C (B-COMPLEX WITH VITAMIN C) tablet [id number] No Take 1 tablet by mouth 3 (three) times  a week. [provider] Taking Active Self           Med Note Mliss Anderson [id number], SUSAN A   Sun Feb 04, 2024  4:51 PM)    Cholecalciferol (VITAMIN D-3 PO) [id number] No Take 1 tablet by mouth 3 (three) times a week. [provider] Taking Active Self           Med Note Guido Leeks, [name]   Fri Mar 01, 2024  6:33 PM) Strength unconfirmed  EPINEPHrine  0.3 mg/0.3 mL IJ SOAJ injection [id number] No Inject 0.3 mg into the muscle as needed for anaphylaxis. [name], PA-C Taking Active Self  glipiZIDE  (GLUCOTROL ) 5 MG tablet [id number] No Take 2.5 mg by mouth daily before breakfast. [provider] Taking Active Self           Med Note Guido Leeks, [name]   Fri Mar 01, 2024  7:03 PM) Although the patient said he takes this, I cannot see it's been filled since 03/2023  glucose blood test strip [id number] No 1 each by Other route daily. Use daily for glucose control - Dx 250.00 [name], [name], MD Taking Active Self  metoprolol  succinate (TOPROL -XL) 25 MG 24 hr tablet [id number] No Take 1 tablet (25 mg total) by mouth daily. Nahser, Lela Purple, MD Taking Active Self  propranolol  (INDERAL ) 10 MG tablet [id number] No TAKE 1 TABLET BY MOUTH 4 TIMES DAILY AS NEEDED FOR PALPITATIONS, IRREGULAR OR FAST HEART RATE Nahser, Lela Purple, MD Taking Active Self           Med Note Guido Leeks, [name]   Fri Mar 01, 2024  7:02 PM) I was unable to track  any last fill of this. The patient stated he takes this intermittently.  Semaglutide , 1 MG/DOSE, (OZEMPIC , 1 MG/DOSE,) 4 MG/3ML SOPN [id number] No INJECT 1 MG UNDER THE SKIN ONCE A WEEK AS DIRECTED  Patient taking differently: Inject 1 mg into the skin every Saturday.   Emilie Harden, MD Taking Active Self           Med Note Guido Leeks, [name]   Fri Mar 01, 2024  5:59 PM)    tadalafil  (CIALIS ) 5 MG tablet [id number] No Take 1 tablet (5 mg total) by mouth daily as needed for erectile dysfunction. Audria Leather, MD Taking Active Self            Recommendation:   Specialty provider follow-up oncology/ urology provider as scheduled Continue to follow established plan of care  Follow Up Plan:   Telephone follow-up in 1 week- as scheduled 03/11/24  Plan for next week's call: Review medication adherence Review upcoming provider appointments- ensure urology follow up in progress/ scheduled Review blood sugars at home, reinforce importance of routine blood sugar monitoring at home Reinforce/ provide education re: signs/ symptoms UTI along with action plan TOC case closure if no hospital readmission- refer to longitudinal RN CM for ongoing outreach if patient agreeable  Pls call/ message for questions,  Domonic [employer]isc[employer] [name] Jericha Bryden, RN, BSN, CCRN Alumnus RN Care Manager  Transitions of Care  [facility]BCI - [facility] [phone]: direct office

## 2024-03-05 NOTE — Patient Instructions (Signed)
 Visit Information  Thank you for taking time to visit with me today. Please don't hesitate to contact me if I can be of assistance to you before our next scheduled telephone appointment.  Our next appointment is by telephone on Monday 03/11/24 at 11:00 am  Please call the care guide team at [phone] if you need to cancel or reschedule your appointment.   Following are the goals we discussed today:  Patient Self Care Activities:  Attend all scheduled provider appointments Call provider office for new concerns or questions  Participate in Transition of Care Program/Attend TOC scheduled calls Take medications as prescribed   Use assistive devices as needed to prevent falls- your cane If you believe your condition is getting worse- contact your care providers (doctors) promptly- reaching out to your doctor early when you have concerns can prevent you from having to go to the hospital Keep skin around urostomy clean and dry, change appliance immediately if have any leakage, report any redness to your provider Stay well hydrated- drink preferably water   If you are experiencing a Mental Health or Behavioral Health Crisis or need someone to talk to, please  call the Suicide and Crisis Lifeline: 988 call the [place]  National Suicide Prevention Lifeline: 414-476-5988 or TTY: (641)878-1148 TTY (339) 425-8154) to talk to a trained counselor call 1-800-273-TALK (toll free, 24 hour hotline) go to [facility] [address] [place] [phone]) call the [facility] Crisis Line: 437-775-6604 call 911   Patient verbalizes understanding of instructions and care plan provided today and agrees to view in MyChart. Active MyChart status and patient understanding of how to access instructions and care plan via MyChart confirmed with patient.     Jamie Belger [name] Chao Blazejewski, RN, BSN, Media planner  Transitions of Care  VBCI - [facility]  Health [phone]: direct office

## 2024-03-06 ENCOUNTER — Encounter: Payer: Self-pay | Admitting: Hematology

## 2024-03-06 ENCOUNTER — Ambulatory Visit: Payer: Self-pay | Admitting: Infectious Diseases

## 2024-03-06 LAB — CBC
HCT: 29.5 % — ABNORMAL LOW (ref 38.5–50.0)
Hemoglobin: 9.6 g/dL — ABNORMAL LOW (ref 13.2–17.1)
MCH: 27.8 pg (ref 27.0–33.0)
MCHC: 32.5 g/dL (ref 32.0–36.0)
MCV: 85.5 fL (ref 80.0–100.0)
MPV: 11.6 fL (ref 7.5–12.5)
Platelets: 144 10*3/uL (ref 140–400)
RBC: 3.45 10*6/uL — ABNORMAL LOW (ref 4.20–5.80)
RDW: 13.5 % (ref 11.0–15.0)
WBC: 7.2 10*3/uL (ref 3.8–10.8)

## 2024-03-06 LAB — COMPREHENSIVE METABOLIC PANEL WITH GFR
AG Ratio: 1.5 (calc) (ref 1.0–2.5)
ALT: 9 U/L (ref 9–46)
AST: 7 U/L — ABNORMAL LOW (ref 10–35)
Albumin: 4 g/dL (ref 3.6–5.1)
Alkaline phosphatase (APISO): 76 U/L (ref 35–144)
BUN/Creatinine Ratio: 14 (calc) (ref 6–22)
BUN: 77 mg/dL — ABNORMAL HIGH (ref 7–25)
CO2: 21 mmol/L (ref 20–32)
Calcium: 9.3 mg/dL (ref 8.6–10.3)
Chloride: 107 mmol/L (ref 98–110)
Creat: 5.38 mg/dL — ABNORMAL HIGH (ref 0.70–1.28)
Globulin: 2.6 g/dL (ref 1.9–3.7)
Glucose, Bld: 133 mg/dL — ABNORMAL HIGH (ref 65–99)
Potassium: 4.8 mmol/L (ref 3.5–5.3)
Sodium: 139 mmol/L (ref 135–146)
Total Bilirubin: 0.4 mg/dL (ref 0.2–1.2)
Total Protein: 6.6 g/dL (ref 6.1–8.1)
eGFR: 11 mL/min/{1.73_m2} — ABNORMAL LOW (ref >=60)

## 2024-03-06 LAB — CULTURE, BLOOD (SINGLE)
MICRO NUMBER:: 16431919
MICRO NUMBER:: 16431923

## 2024-03-07 ENCOUNTER — Ambulatory Visit

## 2024-03-07 ENCOUNTER — Other Ambulatory Visit

## 2024-03-07 ENCOUNTER — Ambulatory Visit: Admitting: Hematology

## 2024-03-07 DIAGNOSIS — Z8551 Personal history of malignant neoplasm of bladder: Secondary | ICD-10-CM | POA: Diagnosis not present

## 2024-03-07 DIAGNOSIS — Z936 Other artificial openings of urinary tract status: Secondary | ICD-10-CM | POA: Diagnosis not present

## 2024-03-11 ENCOUNTER — Other Ambulatory Visit: Payer: Self-pay | Admitting: *Deleted

## 2024-03-11 DIAGNOSIS — E119 Type 2 diabetes mellitus without complications: Secondary | ICD-10-CM

## 2024-03-11 DIAGNOSIS — C679 Malignant neoplasm of bladder, unspecified: Secondary | ICD-10-CM

## 2024-03-11 DIAGNOSIS — N390 Urinary tract infection, site not specified: Secondary | ICD-10-CM

## 2024-03-11 NOTE — Patient Instructions (Signed)
 Visit Information  Thank you for taking time to visit with me today. Please don't hesitate to contact me if I can be of assistance to you before our next scheduled telephone appointment.  Robert [name] it has been a pleasure working with you over the last 30 days!  Great job managing your care after your hospital visit!  I am glad that you are doing well!  Please do not hesitate to contact me in the future if I can be of assistance to you!  Please listen for a call from the new nurse care manager and social worker who will pick up in your care where we are leaving off today  Following are the goals we discussed today:  Patient Self Care Activities:  Attend all scheduled provider appointments Call provider office for new concerns or questions  Take medications as prescribed   Use assistive devices as needed to prevent falls- your cane If you believe your condition is getting worse- contact your care providers (doctors) promptly- reaching out to your doctor early when you have concerns can prevent you from having to go to the hospital Keep skin around urostomy clean and dry, change appliance immediately if have any leakage, report any redness to your provider Stay well hydrated- drink preferably water   If you are experiencing a Mental Health or Behavioral Health Crisis or need someone to talk to, please  call the Suicide and Crisis Lifeline: 988 call the [place]  National Suicide Prevention Lifeline: 773-666-2301 or TTY: 641-768-0311 TTY 602-172-0272) to talk to a trained counselor call 1-800-273-TALK (toll free, 24 hour hotline) go to [facility] [address], [place] [phone]) call the [facility] Crisis Line: (281)570-6954 call 911   Patient verbalizes understanding of instructions and care plan provided today and agrees to view in MyChart. Active MyChart status and patient understanding of how to access instructions and care plan via MyChart  confirmed with patient.     Pls call/ message for questions,  [name] [name] [name], RN, BSN, CCRN Alumnus RN Care Manager  Transitions of Care  VBCI - [facility] Health [phone]: direct office

## 2024-03-11 NOTE — Transitions of Care (Post Inpatient/ED Visit) (Signed)
 Transition of Care week # 6- TOC 30-day program case closure/ day # 32  Visit Note  03/11/2024  Name: Robert [name] MRN: [id number]          DOB: 1951/03/14  Situation: Patient enrolled in [facility] 30-day program. Visit completed with patient by telephone.   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  Initial Transition Care Management Follow-up Telephone Call    Past Medical History:  Diagnosis Date   Anticoagulant long-term use    eliquis --- managed by cardiology   Arthritis    Bladder cancer [facility])    urologist--- dr Derrick Fling;  overlapping   Cataract    History of basal cell carcinoma (BCC) excision    History of COVID-19 05/2019   per pt mild symptoms that resolved   HLD (hyperlipidemia)    HTN (hypertension)    Hx of colonic polyps    Insulin  dependent type 2 diabetes mellitus [facility])    endocrinologist--- dr Aldona Amel --  (03-11-2022 per pt check blood sugar 1-2 times daily,  fasting sugar-- 118-120s)   Nocturia    Paroxysmal atrial fibrillation (HCC) 03/12/2019   cardiologist--- dr Floria Hurst   Renal disorder    Ureterocele    Use of cane as ambulatory aid    Assessment:  "I am doing really good since they adjusted the stent in my ureter.  Good volume of pee, it looks fine and I am changing the bag every 2 days, instead of every 3.  Managing it fine on my own.  No concerns here at all.  I am just trying hard to stay stress-free; caring for my wife is all-consuming and it takes a lot out of me.  I don't know what I would do if I didn't have her paid caregiver to help me so I can get out every now and then."    Denies clinical concerns and sounds to be in no distress  throughout TOC 30-day program outreach call today  Patient Reported Symptoms: Cognitive Cognitive Status: Alert and oriented to person, place, and time, Insightful and able to interpret abstract concepts, Normal speech and language skills Cognitive/Intellectual Conditions Management [RPT]: None reported or documented in medical history or problem list   Health Maintenance Behaviors: Healthy diet, Sleep adequate  Neurological Neurological Review of Symptoms: No symptoms reported    HEENT HEENT Symptoms Reported: No symptoms reported      Cardiovascular Cardiovascular Symptoms Reported: No symptoms reported Does patient have uncontrolled Hypertension?: No Cardiovascular Conditions: Hypertension Cardiovascular Management Strategies: Medication therapy, Routine screening, Diet modification, Coping strategies  Respiratory Respiratory Symptoms Reported: No symptoms reported Other Respiratory Symptoms: Denies shortness of breath/ cough; in no audible distress throughout [facility] call    Endocrine Patient reports the following symptoms related to hypoglycemia or hyperglycemia : No symptoms reported    Gastrointestinal Gastrointestinal Symptoms Reported: No symptoms reported Additional Gastrointestinal Details: Reports "good" appetite/ "normal" bowel movements   Nutrition Risk Screen (CP): No indicators present  Genitourinary Genitourinary Symptoms Reported: No symptoms reported Additional Genitourinary Details: Reports "urostomy draining fine, very good volume and urine is clear yellow; I have started changing the bag out every 2 days instead f every 3 and that is going fine" Genitourinary Conditions: Bladder cancer, Chronic  kidney disease, Kidney cancer Genitourinary Management Strategies: Medical device, Medication therapy, Urostomy  Integumentary Integumentary Symptoms Reported: No symptoms reported Additional Integumentary Details: Continues to report "skin looks fine" around porta-cath and  urostomy sites Skin Management Strategies: Medical device, Routine screening  Musculoskeletal Musculoskelatal Symptoms Reviewed: Unsteady gait Additional Musculoskeletal Details: confirmed continues using cane prn- "I use it just about all of the time" Musculoskeletal Conditions: Joint pain, Mobility limited Musculoskeletal Management Strategies: Routine screening, Medical device      Psychosocial Psychosocial Symptoms Reported: Other Other Psychosocial Conditions: Reports he continues providing care for spouse who has dementia: reports has paid caregiver for spouse daily: Reports intermittent feelings of being overwhelmed with cariung for spouse: LCSW referral placed Behavioral Health Conditions: Other Other Behavorial Health Conditions: Caregiver stress Behavioral Management Strategies: Exercise Major Change/Loss/Stressor/Fears (CP): Medical condition, self, Medical condition, family, Resources Techniques to [place] with Loss/Stress/Change: Diversional activities, None (reports continues to have paid caregiver for his spouse) Quality of Family Relationships: stressful (caregiver for spouse who has dementia)   There were no vitals filed for this visit.  Medications Reviewed Today     Reviewed by Tarig Zimmers M, RN (Registered Nurse) on 03/11/24 at 1112  Med List Status: <None>   Medication Order Taking? Sig Documenting Provider Last Dose Status Informant  amLODipine  (NORVASC ) 5 MG tablet [id number] No Take 1 tablet (5 mg total) by mouth daily. Sueellen Emery, MD Taking Active Self           Med Note Guido Leeks, [name]   Fri Mar 01, 2024  6:56 PM) Although the patient said he takes this, I cannot see it's been filled since 08/2023  apixaban  (ELIQUIS ) 5 MG TABS tablet [id number] No Take 1 tablet (5 mg total) by mouth 2 (two) times daily. Nahser, Lela Purple, MD Taking Active Self  atorvastatin  (LIPITOR) 40 MG tablet [id number] No Take 1 tablet (40 mg total) by mouth at bedtime. Audria Leather, MD  Taking Active Self  B Complex-C (B-COMPLEX WITH VITAMIN C) tablet [id number] No Take 1 tablet by mouth 3 (three) times a week. [provider] Taking Active Self           Med Note Mliss Anderson [id number], SUSAN A   Sun Feb 04, 2024  4:51 PM)    Cholecalciferol (VITAMIN D-3 PO) [id number] No Take 1 tablet by mouth 3 (three) times a week. [provider] Taking Active Self           Med Note Guido Leeks, [name]   Fri Mar 01, 2024  6:33 PM) Strength unconfirmed  EPINEPHrine  0.3 mg/0.3 mL IJ SOAJ injection [id number] No Inject 0.3 mg into the muscle as needed for anaphylaxis. [name], PA-C Taking Active Self  glipiZIDE  (GLUCOTROL ) 5 MG tablet [id number] No Take 2.5 mg by mouth daily before breakfast. [provider] Taking Active Self           Med Note Guido Leeks, [name]   Fri Mar 01, 2024  7:03 PM) Although the patient said he takes this, I cannot see it's been filled since 03/2023  glucose blood test strip [id number] No 1 each by Other route daily. Use daily for glucose control - Dx 250.00 [name], [name], MD Taking Active Self  metoprolol  succinate (TOPROL -XL) 25 MG 24 hr tablet [id number] No Take 1 tablet (25 mg total) by mouth daily. Nahser, Lela Purple, MD Taking Active Self  propranolol  (INDERAL ) 10 MG tablet [id number] No TAKE 1 TABLET BY MOUTH 4 TIMES DAILY AS NEEDED  FOR PALPITATIONS, IRREGULAR OR FAST HEART RATE Nahser, Lela Purple, MD Taking Active Self           Med Note Guido Leeks, [name]   Fri Mar 01, 2024  7:02 PM) I was unable to track any last fill of this. The patient stated he takes this intermittently.  Semaglutide , 1 MG/DOSE, (OZEMPIC , 1 MG/DOSE,) 4 MG/3ML SOPN [id number] No INJECT 1 MG UNDER THE SKIN ONCE A WEEK AS DIRECTED  Patient taking differently: Inject 1 mg into the skin every Saturday.   Emilie Harden, MD Taking Active Self           Med Note Guido Leeks, [name]   Fri Mar 01, 2024  5:59 PM)    tadalafil  (CIALIS ) 5 MG tablet [id number] No Take 1 tablet (5 mg  total) by mouth daily as needed for erectile dysfunction. Audria Leather, MD Taking Active Self           Recommendation:   PCP Follow-up- as scheduled 03/19/24 Specialty provider follow-up oncology 03/14/24; urology- as scheduled Referral to: VBCI longitudinal RN CM and LCSW  Follow Up Plan:   Referral to RN Case Manager Licensed Clinical Social Worker  Pls call/ message for questions,  Kashvi Prevette [name] Azelyn Batie, RN, BSN, CCRN Alumnus RN Care Manager  Transitions of Care  VBCI - [facility] Health [phone]: direct office

## 2024-03-12 ENCOUNTER — Telehealth: Payer: Self-pay | Admitting: *Deleted

## 2024-03-12 NOTE — Progress Notes (Signed)
 Complex Care Management Note  Care Guide Note 03/12/2024 Name: Robert [name] MRN: [id number] DOB: Dec 12, 1950  Robert [name] is a 73 y.o. year old male who sees Plotnikov, Aleksei V, MD for primary care. I reached out to Robert [name] by phone today to offer complex care management services.  Robert [name] was given information about Complex Care Management services today including:   The Complex Care Management services include support from the care team which includes your Nurse Care Manager, Clinical Social Worker, or Pharmacist.  The Complex Care Management team is here to help remove barriers to the health concerns and goals most important to you. Complex Care Management services are voluntary, and the patient may decline or stop services at any time by request to their care team member.   Complex Care Management Consent Status: Patient agreed to services and verbal consent obtained.   Follow up plan:  Telephone appointment with complex care management team member scheduled for:  03/15/2024 and 03/26/2024  Encounter Outcome:  Patient Scheduled  Kandis Ormond, CMA [place]  [facility] Guide Direct Dial: [phone]  Fax: [phone] Website: [email or web address]

## 2024-03-14 ENCOUNTER — Inpatient Hospital Stay

## 2024-03-14 ENCOUNTER — Inpatient Hospital Stay: Attending: Hematology

## 2024-03-14 ENCOUNTER — Encounter: Payer: Self-pay | Admitting: Hematology

## 2024-03-14 ENCOUNTER — Inpatient Hospital Stay (HOSPITAL_BASED_OUTPATIENT_CLINIC_OR_DEPARTMENT_OTHER): Admitting: Hematology

## 2024-03-14 VITALS — BP 138/66 | HR 93 | Temp 97.9°F | Resp 20 | Ht 74.0 in | Wt 196.7 lb

## 2024-03-14 DIAGNOSIS — C679 Malignant neoplasm of bladder, unspecified: Secondary | ICD-10-CM

## 2024-03-14 DIAGNOSIS — Z95828 Presence of other vascular implants and grafts: Secondary | ICD-10-CM

## 2024-03-14 DIAGNOSIS — Z5112 Encounter for antineoplastic immunotherapy: Secondary | ICD-10-CM | POA: Insufficient documentation

## 2024-03-14 DIAGNOSIS — C675 Malignant neoplasm of bladder neck: Secondary | ICD-10-CM | POA: Insufficient documentation

## 2024-03-14 LAB — CBC WITH DIFFERENTIAL (CANCER CENTER ONLY)
Abs Immature Granulocytes: 0.15 10*3/uL — ABNORMAL HIGH (ref 0.00–0.07)
Basophils Absolute: 0.1 10*3/uL (ref 0.0–0.1)
Basophils Relative: 1 %
Eosinophils Absolute: 0 10*3/uL (ref 0.0–0.5)
Eosinophils Relative: 0 %
HCT: 32.1 % — ABNORMAL LOW (ref 39.0–52.0)
Hemoglobin: 11.2 g/dL — ABNORMAL LOW (ref 13.0–17.0)
Immature Granulocytes: 1 %
Lymphocytes Relative: 18 %
Lymphs Abs: 1.9 10*3/uL (ref 0.7–4.0)
MCH: 28.4 pg (ref 26.0–34.0)
MCHC: 34.9 g/dL (ref 30.0–36.0)
MCV: 81.3 fL (ref 80.0–100.0)
Monocytes Absolute: 0.9 10*3/uL (ref 0.1–1.0)
Monocytes Relative: 8 %
Neutro Abs: 7.6 10*3/uL (ref 1.7–7.7)
Neutrophils Relative %: 72 %
Platelet Count: 199 10*3/uL (ref 150–400)
RBC: 3.95 MIL/uL — ABNORMAL LOW (ref 4.22–5.81)
RDW: 14.3 % (ref 11.5–15.5)
WBC Count: 10.6 10*3/uL — ABNORMAL HIGH (ref 4.0–10.5)
nRBC: 0 % (ref 0.0–0.2)

## 2024-03-14 LAB — CMP (CANCER CENTER ONLY)
ALT: 12 U/L (ref 0–44)
AST: 13 U/L — ABNORMAL LOW (ref 15–41)
Albumin: 4.2 g/dL (ref 3.5–5.0)
Alkaline Phosphatase: 96 U/L (ref 38–126)
Anion gap: 8 (ref 5–15)
BUN: 28 mg/dL — ABNORMAL HIGH (ref 8–23)
CO2: 23 mmol/L (ref 22–32)
Calcium: 9.6 mg/dL (ref 8.9–10.3)
Chloride: 104 mmol/L (ref 98–111)
Creatinine: 1.57 mg/dL — ABNORMAL HIGH (ref 0.61–1.24)
GFR, Estimated: 46 mL/min — ABNORMAL LOW (ref >60)
Glucose, Bld: 219 mg/dL — ABNORMAL HIGH (ref 70–99)
Potassium: 4.3 mmol/L (ref 3.5–5.1)
Sodium: 135 mmol/L (ref 135–145)
Total Bilirubin: 0.4 mg/dL (ref 0.0–1.2)
Total Protein: 7.7 g/dL (ref 6.5–8.1)

## 2024-03-14 MED ORDER — SODIUM CHLORIDE 0.9% FLUSH
10.0000 mL | INTRAVENOUS | Status: DC | PRN
  Administered 2024-03-14: 10 mL

## 2024-03-14 MED ORDER — HEPARIN SOD (PORK) LOCK FLUSH 100 UNIT/ML IV SOLN
500.0000 [IU] | Freq: Once | INTRAVENOUS | Status: AC | PRN
  Administered 2024-03-14: 500 [IU]

## 2024-03-14 MED ORDER — SODIUM CHLORIDE 0.9 % IV SOLN
480.0000 mg | Freq: Once | INTRAVENOUS | Status: AC
  Administered 2024-03-14: 480 mg via INTRAVENOUS
  Filled 2024-03-14: qty 48

## 2024-03-14 MED ORDER — SODIUM CHLORIDE 0.9 % IV SOLN: INTRAVENOUS | Status: DC

## 2024-03-14 NOTE — Progress Notes (Signed)
 Per Dr. Maryalice Smaller, OK to treat with Cr 1.57.

## 2024-03-14 NOTE — Assessment & Plan Note (Signed)
 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 -He is clinically doing well, asymptomatic.  Surgical resection was discussed with patient, but he would like to try nonsurgical options. -He started Keytruda  on 01/12/23, stopped in June 2024 due to disease progression. 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 -I recommended neoadjuvant chemotherapy with cisplatin  and gemcitabine  for 4 cycles, followed by total cystectomy and prostatectomy. -He started chemotherapy on June 08, 2023, and completed on 08/17/2023, overall tolerated well  -He underwent right robotic right nephroureterectomy, robotic radical cystectomy with total urethrectomy, bilateral pelvic lymph node dissection, and left cutaneous ureterostomy by Dr. Secundino Dach on 12/08/2023.. surgical path showed multifocal high-grade urothelial carcinoma, right renal pelvis, ureter, bladder, and prostatic urethra, surgical margines negative, 1 positive node  -He started adjuvant Nivo on 02/15/2024

## 2024-03-14 NOTE — Patient Instructions (Addendum)
 [facility] - A DEPT OF [facility]  Discharge Instructions: Thank you for choosing [facility] to provide your oncology and hematology care.   If you have a lab appointment with the Cancer Center, please go directly to the Cancer Center and check in at the registration area.   Wear comfortable clothing and clothing appropriate for easy access to any Portacath or PICC line.   We strive to give you quality time with your provider. You may need to reschedule your appointment if you arrive late (15 or more minutes).  Arriving late affects you and other patients whose appointments are after yours.  Also, if you miss three or more appointments without notifying the office, you may be dismissed from the clinic at the provider's discretion.      For prescription refill requests, have your pharmacy contact our office and allow 72 hours for refills to be completed.    Today you received the following chemotherapy and/or immunotherapy agents: Opdivo       To help prevent nausea and vomiting after your treatment, we encourage you to take your nausea medication as directed.  BELOW ARE SYMPTOMS THAT SHOULD BE REPORTED IMMEDIATELY: *FEVER GREATER THAN 100.4 F (38 C) OR HIGHER *CHILLS OR SWEATING *NAUSEA AND VOMITING THAT IS NOT CONTROLLED WITH YOUR NAUSEA MEDICATION *UNUSUAL SHORTNESS OF BREATH *UNUSUAL BRUISING OR BLEEDING *URINARY PROBLEMS (pain or burning when urinating, or frequent urination) *BOWEL PROBLEMS (unusual diarrhea, constipation, pain near the anus) TENDERNESS IN MOUTH AND THROAT WITH OR WITHOUT PRESENCE OF ULCERS (sore throat, sores in mouth, or a toothache) UNUSUAL RASH, SWELLING OR PAIN  UNUSUAL VAGINAL DISCHARGE OR ITCHING   Items with * indicate a potential emergency and should be followed up as soon as possible or go to the Emergency Department if any problems should occur.  Please show the CHEMOTHERAPY ALERT CARD or IMMUNOTHERAPY  ALERT CARD at check-in to the Emergency Department and triage nurse.  Should you have questions after your visit or need to cancel or reschedule your appointment, please contact [facility] - A DEPT [facility]  Dept: [phone]  and follow the prompts.  Office hours are 8:00 a.m. to 4:30 p.m. Monday - Friday. Please note that voicemails left after 4:00 p.m. may not be returned until the following business day.  We are closed weekends and major holidays. You have access to a nurse at all times for urgent questions. Please call the main number to the clinic Dept: [phone] and follow the prompts.   For any non-urgent questions, you may also contact your provider using MyChart. We now offer e-Visits for anyone 64 and older to request care online for non-urgent symptoms. For details visit [email or web address].   Also download the MyChart app! Go to the app store, search "MyChart", open the app, select [place], and log in with your MyChart username and password.  Nivolumab  Injection What is this medication? NIVOLUMAB  (nye VOL ue mab) treats some types of cancer. It works by helping your immune system slow or stop the spread of cancer cells. It is a monoclonal antibody. This medicine may be used for other purposes; ask your health care provider or pharmacist if you have questions. COMMON BRAND NAME(S): Opdivo  What should I tell my care team before I take this medication? They need to know if you have any of these conditions: Allogeneic stem cell transplant (uses someone else's stem cells) Autoimmune  diseases, such as Crohn disease, ulcerative colitis, lupus History of chest radiation Nervous system problems, such as Guillain-Barre syndrome or myasthenia gravis Organ transplant An unusual or allergic reaction to nivolumab , other medications, foods, dyes, or preservatives Pregnant or trying to get pregnant Breast-feeding How should I use this  medication? This medication is infused into a vein. It is given in a hospital or clinic setting. A special MedGuide will be given to you before each treatment. Be sure to read this information carefully each time. Talk to your care team about the use of this medication in children. While it may be prescribed for children as young as 12 years for selected conditions, precautions do apply. Overdosage: If you think you have taken too much of this medicine contact a poison control center or emergency room at once. NOTE: This medicine is only for you. Do not share this medicine with others. What if I miss a dose? Keep appointments for follow-up doses. It is important not to miss your dose. Call your care team if you are unable to keep an appointment. What may interact with this medication? Interactions have not been studied. This list may not describe all possible interactions. Give your health care provider a list of all the medicines, herbs, non-prescription drugs, or dietary supplements you use. Also tell them if you smoke, drink alcohol , or use illegal drugs. Some items may interact with your medicine. What should I watch for while using this medication? Your condition will be monitored carefully while you are receiving this medication. You may need blood work while taking this medication. This medication may cause serious skin reactions. They can happen weeks to months after starting the medication. Contact your care team right away if you notice fevers or flu-like symptoms with a rash. The rash may be red or purple and then turn into blisters or peeling of the skin. You may also notice a red rash with swelling of the face, lips, or lymph nodes in your neck or under your arms. Tell your care team right away if you have any change in your eyesight. Talk to your care team if you are pregnant or think you might be pregnant. A negative pregnancy test is required before starting this medication. A reliable  form of contraception is recommended while taking this medication and for 5 months after the last dose. Talk to your care team about effective forms of contraception. Do not breast-feed while taking this medication and for 5 months after the last dose. What side effects may I notice from receiving this medication? Side effects that you should report to your care team as soon as possible: Allergic reactions--skin rash, itching, hives, swelling of the face, lips, tongue, or throat Dry cough, shortness of breath or trouble breathing Eye pain, redness, irritation, or discharge with blurry or decreased vision Heart muscle inflammation--unusual weakness or fatigue, shortness of breath, chest pain, fast or irregular heartbeat, dizziness, swelling of the ankles, feet, or hands Hormone gland problems--headache, sensitivity to light, unusual weakness or fatigue, dizziness, fast or irregular heartbeat, increased sensitivity to cold or heat, excessive sweating, constipation, hair loss, increased thirst or amount of urine, tremors or shaking, irritability Infusion reactions--chest pain, shortness of breath or trouble breathing, feeling faint or lightheaded Kidney injury (glomerulonephritis)--decrease in the amount of urine, red or dark brown urine, foamy or bubbly urine, swelling of the ankles, hands, or feet Liver injury--right upper belly pain, loss of appetite, nausea, light-colored stool, dark yellow or brown urine, yellowing skin or  eyes, unusual weakness or fatigue Pain, tingling, or numbness in the hands or feet, muscle weakness, change in vision, confusion or trouble speaking, loss of balance or coordination, trouble walking, seizures Rash, fever, and swollen lymph nodes Redness, blistering, peeling, or loosening of the skin, including inside the mouth Sudden or severe stomach pain, bloody diarrhea, fever, nausea, vomiting Side effects that usually do not require medical attention (report these to your  care team if they continue or are bothersome): Bone, joint, or muscle pain Diarrhea Fatigue Loss of appetite Nausea Skin rash This list may not describe all possible side effects. Call your doctor for medical advice about side effects. You may report side effects to FDA at 1-800-FDA-1088. Where should I keep my medication? This medication is given in a hospital or clinic. It will not be stored at home. NOTE: This sheet is a summary. It may not cover all possible information. If you have questions about this medicine, talk to your doctor, pharmacist, or health care provider.  2024 [employer]/Gold Standard (2022-02-07 00:00:00)

## 2024-03-14 NOTE — Progress Notes (Addendum)
 [facility]   [phone] Fax:([phone]) [phone]   Clinic Follow up Note   Patient Care Team: Plotnikov, Oakley Bellman, MD as PCP - General (Internal Medicine) Nahser, Lela Purple, MD as PCP - Cardiology (Cardiology) Alver Austin, [facility] (Inactive) as Pharmacist (Pharmacist) Emilie Harden, MD as Consulting Physician (Internal Medicine) Christina Coyer, MD as Consulting Physician (Urology) Kenney Peacemaker, MD as Consulting Physician (Gastroenterology) Rudine Cos, MD as Consulting Physician (Ophthalmology) [name] Deferiet, MD as Attending Physician (Hematology and Oncology)  Date of Service:  03/14/2024  CHIEF COMPLAINT: f/u of bladder cancer   CURRENT THERAPY:  adjuvant nivolumab   Oncology History   Bladder cancer [facility]) Bladder cancer (HCC) -cT2N0M0 stage II diagnosed in 04/2023 -He has had 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 -He is clinically doing well, asymptomatic.  Surgical resection was discussed with patient, but he would like to try nonsurgical options. -He started Keytruda  on 01/12/23, stopped in June 2024 due to disease progression. 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 -I recommended neoadjuvant chemotherapy with cisplatin  and gemcitabine  for 4 cycles, followed by total cystectomy and prostatectomy. -He started chemotherapy on June 08, 2023, and completed on 08/17/2023, overall tolerated well   -He underwent right robotic right nephroureterectomy, robotic radical cystectomy with total urethrectomy, bilateral pelvic lymph node dissection, and left cutaneous ureterostomy by Dr. Secundino Dach on 12/08/2023.. surgical path showed multifocal high-grade urothelial carcinoma, right renal pelvis, ureter, bladder, and prostatic urethra, surgical margines negative, 1 positive node  -He started adjuvant Nivo on 02/15/2024  Assessment & Plan Bladder cancer Bladder cancer under treatment with nivolumab . Received one dose on February 15, 2024, with no significant side effects except intermittent fatigue. Hospitalized on Mar 01, 2024, for an infection, likely related to immunotherapy. Hemoglobin improved to 11.2 from 8.3 at discharge. White count slightly abnormal, platelets normal. Kidney and liver function tests pending. No recurrence of cancer on recent scan. Immunotherapy boosts the immune system but increases infection risk. - Continue nivolumab  every four weeks - Check with pharmacy for availability of injection form for next treatment - Coordinate with urologist Dr. Secundino Dach for upcoming scan - Monitor blood counts and kidney/liver function tests  Fatigue Intermittent fatigue, particularly around 3 PM, possibly related to age, anticoagulation therapy, or cancer treatment. Not excessive and not present every day. - Encourage rest as needed  Plan - Lab reviewed, adequate for treatment, will proceed nivolumab  today and continue every 4 weeks - Follow-up in 4 weeks. - Surveillance CT scan per Dr. Secundino Dach   SUMMARY OF ONCOLOGIC HISTORY: Oncology History Overview Note   Cancer Staging  Bladder cancer [facility]) Staging form: Urinary Bladder, AJCC 8th Edition - Clinical stage from 05/14/2021: Stage 0a (cTa, cN0, cM0) - Signed by [name] St[place], MD on 01/02/2023 Stage prefix: Initial diagnosis WHO/ISUP grade (low/high): High Grade Histologic grading system: 2 grade system - Pathologic stage from 02/22/2022: Stage I (pT1,  pN0, cM0) - Signed by [name] [place], MD on 01/02/2023 WHO/ISUP grade (low/high): High Grade Histologic grading system: 2 grade system  Bladder cancer (HCC)  05/14/2021 Cancer Staging   Staging form: Urinary Bladder, AJCC 8th Edition - Clinical stage from 05/14/2021: Stage 0a (cTa, cN0, cM0) - Signed by [name] [place], MD on 01/02/2023 Stage prefix: Initial diagnosis WHO/ISUP grade (low/high): High Grade Histologic grading system: 2 grade system   01/31/2022 Initial Diagnosis   Bladder cancer (HCC)   02/22/2022 Cancer Staging   Staging form: Urinary Bladder, AJCC 8th Edition - Pathologic stage from 02/22/2022: Stage I (pT1, pN0, cM0) - Signed by [name] [place], MD on 01/02/2023 WHO/ISUP grade (low/high): High Grade Histologic grading system: 2 grade system   01/12/2023 - 04/06/2023 Chemotherapy   Patient is on Treatment Plan : BLADDER Pembrolizumab  (200) q21d     05/05/2023 Pathology Results     FINAL MICROSCOPIC DIAGNOSIS:   A. BLADDER, RIGHT POSTERIOR, TURBT:  Noninvasive high grade papillary urothelial carcinoma  Muscularis propria (detrusor muscle) is not present   B. BLADDER NECK AND PROSTATIC URETHRA, BIOPSY:  High grade urothelial carcinoma  The carcinoma focally invades muscularis propria (detrusor muscle)     05/05/2023 Cancer Staging   Staging form: Urinary Bladder, AJCC 8th Edition - Clinical stage from 05/05/2023: Stage II (cT2, cN0, cM0) - Signed by [name] [place], MD on 06/15/2023 WHO/ISUP grade (low/high): High Grade Histologic grading system: 2 grade system   06/01/2023 Imaging   CT Chest, Abdomen, and Pelvis with contrast  IMPRESSION: 1. Similar irregular asymmetric wall thickening along the posterior urinary bladder with progressive irregular thickening of the right ureter extending to the level of the renal pelvis where there is now a masslike 16 x 12 mm soft tissue focus, suspicious for direct ureteral extension of disease into the renal pelvis. 2. Irregular asymmetric wall  thickening along the posterior urinary bladder measuring 14 mm is similar prior. 3. Right double-J ureteral stent is in place with persistent perinephric/periureteric stranding without significant hydronephrosis. 4. No convincing evidence of metastatic disease in the chest, abdomen or pelvis 5. Pancolonic diverticulosis without findings of acute diverticulitis. 6. Aortic Atherosclerosis (ICD10-I70.0) and Emphysema (ICD10-J43.9).   06/09/2023 - 08/17/2023 Chemotherapy   Patient is on Treatment Plan : BLADDER Cisplatin  D1 + Gemcitabine  D1,8 q21d x 4 Cycles     02/15/2024 -  Chemotherapy   Patient is on Treatment Plan : BLADDER Nivolumab  (480) q28d        Discussed the use of AI scribe software for clinical note transcription with the patient, who gave verbal consent to proceed.  History of Present Illness Robert SUDER II "Robert [name]" is a 73 year old male with blood cancer who presents for follow-up.  He received nivolumab  on February 15, 2024, and was hospitalized on Mar 01, 2024, due to an infection. He experiences intermittent fatigue following the infusion, which is not excessive. Hemoglobin levels have improved from 8.3 to 11.2 since hospitalization. White blood cell count is slightly abnormal, but platelet count is normal. Kidney and liver function tests are pending.  He completed a course of antibiotics. Significant chills occurred three to four weeks ago, severe enough to prevent him from holding a telephone. Currently, he experiences only mild chills occasionally. No fever or significant chills currently.  He mentions a previous issue with creatinine levels, addressed by a urologist during his hospital stay. He has a stent in his ostomy, which was replaced after malfunctioning. The new stent is functioning well.     All other systems were reviewed with the patient and are negative.  MEDICAL HISTORY:  Past Medical History:  Diagnosis Date  Anticoagulant long-term use    eliquis ---  managed by cardiology   Arthritis    Bladder cancer [facility])    urologist--- dr Derrick Fling;  overlapping   Cataract    History of basal cell carcinoma (BCC) excision    History of COVID-19 05/2019   per pt mild symptoms that resolved   HLD (hyperlipidemia)    HTN (hypertension)    Hx of colonic polyps    Insulin  dependent type 2 diabetes mellitus [facility])    endocrinologist--- dr Aldona Amel --  (03-11-2022 per pt check blood sugar 1-2 times daily,  fasting sugar-- 118-120s)   Nocturia    Paroxysmal atrial fibrillation (HCC) 03/12/2019   cardiologist--- dr Floria Hurst   Renal disorder    Ureterocele    Use of cane as ambulatory aid     SURGICAL HISTORY: Past Surgical History:  Procedure Laterality Date   BLEPHAROPLASTY Bilateral    per pt approx  2001;   upper eyelid's   COLONOSCOPY     last one approx 2023   CYSTOSCOPY W/ RETROGRADES N/A 12/08/2023   Procedure: CYSTOSCOPY;  Surgeon: Melody Spurling., MD;  Location: [facility];  Service: Urology;  Laterality: N/A;  390 MINUTES NEEDED FOR CASE   CYSTOSCOPY WITH URETEROSCOPY AND STENT PLACEMENT Right 12/02/2022   Procedure: CYSTOSCOPY WITH RIGHT URETEROSCOPY, RIGHT DIAGNOSTIC URETEROSCOPY;  Surgeon: Christina Coyer, MD;  Location: [facility];  Service: Urology;  Laterality: Right;  90 MINS FOR CASE   CYSTOSCOPY/RETROGRADE/URETEROSCOPY Bilateral 05/05/2023   Procedure: CYSTOSCOPY BILATERAL RETROGRADE PYELOGRAM RIGHT URETEROSCOPY RIGHT STENT PLACEMENT;  Surgeon: Christina Coyer, MD;  Location: [facility];  Service: Urology;  Laterality: Bilateral;  90 MINS FOR CASE   IR IMAGING GUIDED PORT INSERTION  05/30/2023   PATELLAR TENDON REPAIR Left 04/16/2004   @[facility]    ROBOT ASSITED LAPAROSCOPIC NEPHROURETERECTOMY Right 12/08/2023   Procedure: RIGHT ROBOT ASSISTED Nephroureterectomy;  Surgeon: Melody Spurling., MD;  Location: [facility];  Service: Urology;  Laterality: Right;   ROBOT LAP RADICAL CYSTOPROSTATECTOMY PELVIC LYMPHADENECTOMY,  NEOBLADDER  12/08/2023   Procedure: ROBOT ASSISTED LAPAROSCOPIC RADICAL CYSTOPROSTATECTOMY;  Surgeon: Melody Spurling., MD;  Location: [facility];  Service: Urology;;   TOTAL URETHRECTOMY,RADICAL N/A 12/08/2023   Procedure: TOTAL URETHRECTOMY,RADICAL;  Surgeon: Melody Spurling., MD;  Location: [facility];  Service: Urology;  Laterality: N/A;   TRANSURETHRAL RESECTION OF BLADDER TUMOR N/A 06/18/2021   Procedure: TRANSURETHRAL RESECTION OF BLADDER TUMOR (TURBT) WITH RIGHT URETERAL STENT PLACEMENT, RIGHT URETEROSCOPY WITH DISTRACTION OF TUMOR, FULGURATION;  Surgeon: Christina Coyer, MD;  Location: [facility];  Service: Urology;  Laterality: N/A;   TRANSURETHRAL RESECTION OF BLADDER TUMOR N/A 03/15/2022   Procedure: TRANSURETHRAL RESECTION OF BLADDER TUMOR (TURBT) WITH BILATERAL URETEROSCOPY/URETHRAL/ DILATION/BILATERAL RETROGRADE PYELOGRAM/BIOPSY AND FULGURATION OF RIGHT URETERAL CANCER/RIGHT STENT PLACEMENT;  Surgeon: Christina Coyer, MD;  Location: [facility];  Service: Urology;  Laterality: N/A;   TRANSURETHRAL RESECTION OF BLADDER TUMOR N/A 05/05/2023   Procedure: TRANSURETHRAL RESECTION OF BLADDER TUMOR (TURBT) BLADDER BIOSPY;  Surgeon: Christina Coyer, MD;  Location: [facility];  Service: Urology;  Laterality: N/A;   TRANSURETHRAL RESECTION OF BLADDER TUMOR WITH MITOMYCIN -C N/A 12/02/2022   Procedure: TRANSURETHRAL RESECTION OF BLADDER TUMOR;  Surgeon: Christina Coyer, MD;  Location: [facility];  Service: Urology;  Laterality: N/A;   TRANSURETHRAL RESECTION OF PROSTATE  05/05/2023   Procedure: TRANSURETHRAL RESECTION OF THE PROSTATE (TURP);  Surgeon: Christina Coyer, MD;  Location: [facility];  Service: Urology;;  I have reviewed the social history and family history with the patient and they are unchanged from previous note.  ALLERGIES:  is allergic to lisinopril .  MEDICATIONS:  Current Outpatient Medications  Medication  Sig Dispense Refill   amLODipine  (NORVASC ) 5 MG tablet Take 1 tablet (5 mg total) by mouth daily. 30 tablet 0   apixaban  (ELIQUIS ) 5 MG TABS tablet Take 1 tablet (5 mg total) by mouth 2 (two) times daily. 180 tablet 1   atorvastatin  (LIPITOR) 40 MG tablet Take 1 tablet (40 mg total) by mouth at bedtime.     B Complex-C (B-COMPLEX WITH VITAMIN C) tablet Take 1 tablet by mouth 3 (three) times a week.     Cholecalciferol (VITAMIN D-3 PO) Take 1 tablet by mouth 3 (three) times a week.     EPINEPHrine  0.3 mg/0.3 mL IJ SOAJ injection Inject 0.3 mg into the muscle as needed for anaphylaxis. 1 each 0   glipiZIDE  (GLUCOTROL ) 5 MG tablet Take 2.5 mg by mouth daily before breakfast.     glucose blood test strip 1 each by Other route daily. Use daily for glucose control - Dx 250.00 100 each 0   metoprolol  succinate (TOPROL -XL) 25 MG 24 hr tablet Take 1 tablet (25 mg total) by mouth daily. 90 tablet 2   propranolol  (INDERAL ) 10 MG tablet TAKE 1 TABLET BY MOUTH 4 TIMES DAILY AS NEEDED FOR PALPITATIONS, IRREGULAR OR FAST HEART RATE 270 tablet 1   Semaglutide , 1 MG/DOSE, (OZEMPIC , 1 MG/DOSE,) 4 MG/3ML SOPN INJECT 1 MG UNDER THE SKIN ONCE A WEEK AS DIRECTED (Patient taking differently: Inject 1 mg into the skin every Saturday.) 9 mL 3   tadalafil  (CIALIS ) 5 MG tablet Take 1 tablet (5 mg total) by mouth daily as needed for erectile dysfunction.     No current facility-administered medications for this visit.   Facility-Administered Medications Ordered in Other Visits  Medication Dose Route Frequency Provider Last Rate Last Admin   0.9 %  sodium chloride  infusion   Intravenous Continuous [name] Hackberry, MD   Stopped at 03/14/24 1146   sodium chloride  flush (NS) 0.9 % injection 10 mL  10 mL Intracatheter PRN [name] Nye, MD   10 mL at 02/15/24 1353   sodium chloride  flush (NS) 0.9 % injection 10 mL  10 mL Intracatheter PRN [name] Destin, MD   10 mL at 03/14/24 1142    PHYSICAL EXAMINATION: ECOG PERFORMANCE STATUS: 2 -  Symptomatic, <50% confined to bed  Vitals:   03/14/24 0944  BP: 138/66  Pulse: 93  Resp: 20  Temp: 97.9 F (36.6 C)  SpO2: 97%   Wt Readings from Last 3 Encounters:  03/14/24 196 lb 11.2 oz (89.2 kg)  03/04/24 200 lb (90.7 kg)  03/01/24 208 lb (94.3 kg)     GENERAL:alert, no distress and comfortable SKIN: skin color, texture, turgor are normal, no rashes or significant lesions EYES: normal, Conjunctiva are pink and non-injected, sclera clear NECK: supple, thyroid  normal size, non-tender, without nodularity LYMPH:  no palpable lymphadenopathy in the cervical, axillary  LUNGS: clear to auscultation and percussion with normal breathing effort HEART: regular rate & rhythm and no murmurs and no lower extremity edema ABDOMEN:abdomen soft, non-tender and normal bowel sounds Musculoskeletal:no cyanosis of digits and no clubbing  NEURO: alert & oriented x 3 with fluent speech, no focal motor/sensory deficits  Physical Exam CHEST: Lungs clear to auscultation bilaterally. ABDOMEN: Ostomy site clean.  LABORATORY DATA:  I have reviewed the data as listed  Latest Ref Rng & Units 03/14/2024    9:17 AM 03/02/2024    5:08 AM 03/01/2024   12:41 PM  CBC  WBC 4.0 - 10.5 K/uL 10.6  7.0  8.5   Hemoglobin 13.0 - 17.0 g/dL 16.1  8.3  9.4   Hematocrit 39.0 - 52.0 % 32.1  25.3  28.0   Platelets 150 - 400 K/uL 199  130  160         Latest Ref Rng & Units 03/14/2024    9:17 AM 03/02/2024    2:37 PM 03/02/2024    5:08 AM  CMP  Glucose 70 - 99 mg/dL 096  045  409   BUN 8 - 23 mg/dL 28  53  62   Creatinine 0.61 - 1.24 mg/dL 8.11  9.14  7.82   Sodium 135 - 145 mmol/L 135  139  137   Potassium 3.5 - 5.1 mmol/L 4.3  4.4  4.1   Chloride 98 - 111 mmol/L 104  107  108   CO2 22 - 32 mmol/L 23  24  18    Calcium  8.9 - 10.3 mg/dL 9.6  8.8  8.6   Total Protein 6.5 - 8.1 g/dL 7.7     Total Bilirubin 0.0 - 1.2 mg/dL 0.4     Alkaline Phos 38 - 126 U/L 96     AST 15 - 41 U/L 13     ALT 0 - 44 U/L 12          RADIOGRAPHIC STUDIES: I have personally reviewed the radiological images as listed and agreed with the findings in the report. No results found.    Orders Placed This Encounter  Procedures   CBC with Differential (Cancer Center Only)    Standing Status:   Future    Expected Date:   05/07/2024    Expiration Date:   05/07/2025   CMP (Cancer Center only)    Standing Status:   Future    Expected Date:   05/07/2024    Expiration Date:   05/07/2025   CBC with Differential (Cancer Center Only)    Standing Status:   Future    Expected Date:   06/04/2024    Expiration Date:   06/04/2025   CMP (Cancer Center only)    Standing Status:   Future    Expected Date:   06/04/2024    Expiration Date:   06/04/2025   All questions were answered. The patient knows to call the clinic with any problems, questions or concerns. No barriers to learning was detected. The total time spent in the appointment was 25 minutes, including review of chart and various tests results, discussions about plan of care and coordination of care plan     [name] Newald, MD 03/14/2024

## 2024-03-15 ENCOUNTER — Other Ambulatory Visit: Payer: Self-pay

## 2024-03-15 NOTE — Patient Instructions (Signed)
 Visit Information  Thank you for taking time to visit with me today. Please don't hesitate to contact me if I can be of assistance to you before our next scheduled appointment.  Our next appointment is by telephone on 03/21/24 at 2:00 pm Please call the care guide team at [phone] if you need to cancel or reschedule your appointment.   Following is a copy of your care plan:   Goals Addressed             This Visit's Progress    VBCI RN Care Plan       Problems:  Chronic Disease Management support and education needs related to Bladder CA(left ureterostomy)  Caregiver support: Coping strategies(caregiver for spouse who has dementia).  Goal: Over the next 90 days the Patient will attend all scheduled medical appointments: Primary care provider on 03/19/24, oncology, LCSW as evidenced by patient report or review of chart        continue to work with RN Care Manager and/or Social Worker to address care management and care coordination needs related to bladder ca(left ureterostomy) as evidenced by adherence to care management team scheduled appointments     demonstrate Ongoing adherence to prescribed treatment plan for bladder CA(left ureterostomy) as evidenced by patient report or review of chart take all medications exactly as prescribed and will call provider for medication related questions as evidenced by patient report or review of chart     Interventions:   Evaluation of current treatment plan related to bladder cancer(left ureterostomy), caregiver support self-management and patient's adherence to plan as established by provider. Discussed plans with patient for ongoing care management follow up and provided patient with direct contact information for care management team Evaluation of current treatment plan related to bladder ca(left ureterostomy) and patient's adherence to plan as established by provider Advised patient to eat healthy, continue to keep blood sugars in  recommended ranges and discussed importance of balanced blood sugar in management of overall health. Reviewed medications with patient and discussed importance of taking as prescribed Reviewed scheduled/upcoming provider appointments including oncology visit completed 03/14/24, PCP visit scheduled on 03/19/24, LCSW visit scheduled for 03/26/24. Discussed plans with patient for ongoing care management follow up and provided patient with direct contact information for care management team Discussed signs/symptoms that took patient to the hospital during last hospitalization. Advised patient to contact provider with any health questions or concerns.  Patient Self-Care Activities:  Attend all scheduled provider appointments Call provider office for new concerns or questions  Perform all self care activities independently  Take medications as prescribed   Work with the social worker to address care coordination needs and will continue to work with the clinical team to address health care and disease management related needs Check blood sugars as recommended by provider  Plan:  Telephone follow up appointment with care management team member scheduled for:  03/21/24 at 2:00 pm           Please call the Suicide and Crisis Lifeline: 988 call the [place]  National Suicide Prevention Lifeline: 709-383-5713 or TTY: 9021690624 TTY 908-484-2720) to talk to a trained counselor if you are experiencing a Mental Health or Behavioral Health Crisis or need someone to talk to.  Patient verbalizes understanding of instructions and care plan provided today and agrees to view in MyChart. Active MyChart status and patient understanding of how to access instructions and care plan via MyChart confirmed with patient.     Lindi Revering, RN, MSN, BSN, CCM [facility]  [facility], Population Health Case Manager Phone: [phone]

## 2024-03-19 ENCOUNTER — Encounter: Payer: Self-pay | Admitting: Internal Medicine

## 2024-03-19 ENCOUNTER — Ambulatory Visit (INDEPENDENT_AMBULATORY_CARE_PROVIDER_SITE_OTHER): Admitting: Internal Medicine

## 2024-03-19 VITALS — BP 122/60 | HR 61 | Temp 97.9°F | Ht 74.0 in | Wt 200.2 lb

## 2024-03-19 DIAGNOSIS — G629 Polyneuropathy, unspecified: Secondary | ICD-10-CM | POA: Diagnosis not present

## 2024-03-19 DIAGNOSIS — Z7984 Long term (current) use of oral hypoglycemic drugs: Secondary | ICD-10-CM

## 2024-03-19 DIAGNOSIS — R6883 Chills (without fever): Secondary | ICD-10-CM | POA: Diagnosis not present

## 2024-03-19 DIAGNOSIS — N390 Urinary tract infection, site not specified: Secondary | ICD-10-CM | POA: Diagnosis not present

## 2024-03-19 DIAGNOSIS — E119 Type 2 diabetes mellitus without complications: Secondary | ICD-10-CM

## 2024-03-19 DIAGNOSIS — C675 Malignant neoplasm of bladder neck: Secondary | ICD-10-CM

## 2024-03-19 DIAGNOSIS — Z905 Acquired absence of kidney: Secondary | ICD-10-CM | POA: Insufficient documentation

## 2024-03-19 MED ORDER — CIPROFLOXACIN HCL 500 MG PO TABS: 500.0000 mg | ORAL_TABLET | Freq: Two times a day (BID) | ORAL | 0 refills | Status: DC

## 2024-03-19 NOTE — Assessment & Plan Note (Signed)
 Hydrate well No NSAIDs Keep Cipro  at home Use UTI tests OTC

## 2024-03-19 NOTE — Assessment & Plan Note (Signed)
 Metformin  d/c 2025 s/p L nephrectomy

## 2024-03-19 NOTE — Assessment & Plan Note (Signed)
 Check temp Keep Cipro  at home Use UTI tests OTC

## 2024-03-19 NOTE — Assessment & Plan Note (Signed)
 Keep Cipro  at home UTI tests OTC

## 2024-03-19 NOTE — Assessment & Plan Note (Signed)
Diabetic socks 

## 2024-03-19 NOTE — Progress Notes (Signed)
 Subjective:  Patient ID: Robert [name], male    DOB: [birth year]  Age: 73 y.o. MRN: [id number]  CC: Hospitalization Follow-up [facility] follow up 03/01/24 for AKI. Elevated creatinine. Patient noted having some chills in the body that prompted the hospital visit. Infection in blood and urine. Had issues with ostomy stint but this has been resolved)   HPI Robert [name] presents for bladder cancer on chemo - he started adjuvant Nivo on 02/15/2024. C/o fatigue 3-4 days after treatments. Nephrostomy L. No fever.  Metformin  d/c 2025 s/p L nephrectomy  Outpatient Medications Prior to Visit  Medication Sig Dispense Refill   amLODipine  (NORVASC ) 5 MG tablet Take 1 tablet (5 mg total) by mouth daily. 30 tablet 0   apixaban  (ELIQUIS ) 5 MG TABS tablet Take 1 tablet (5 mg total) by mouth 2 (two) times daily. 180 tablet 1   atorvastatin  (LIPITOR) 40 MG tablet Take 1 tablet (40 mg total) by mouth at bedtime.     B Complex-C (B-COMPLEX WITH VITAMIN C) tablet Take 1 tablet by mouth 3 (three) times a week.     Cholecalciferol (VITAMIN D-3 PO) Take 1 tablet by mouth 3 (three) times a week.     EPINEPHrine  0.3 mg/0.3 mL IJ SOAJ injection Inject 0.3 mg into the muscle as needed for anaphylaxis. 1 each 0   glipiZIDE  (GLUCOTROL ) 5 MG tablet Take 2.5 mg by mouth daily before breakfast.     glucose blood test strip 1 each by Other route daily. Use daily for glucose control - Dx 250.00 100 each 0   metoprolol  succinate (TOPROL -XL) 25 MG 24 hr tablet Take 1 tablet (25 mg total) by mouth daily. 90 tablet 2   propranolol  (INDERAL ) 10 MG tablet TAKE 1 TABLET BY MOUTH 4 TIMES DAILY AS NEEDED FOR PALPITATIONS, IRREGULAR OR FAST HEART RATE 270 tablet 1   Semaglutide , 1 MG/DOSE, (OZEMPIC , 1 MG/DOSE,) 4 MG/3ML SOPN INJECT 1 MG UNDER THE SKIN ONCE A WEEK AS DIRECTED (Patient taking differently: Inject 1 mg into the skin every Saturday.) 9 mL 3   tadalafil  (CIALIS ) 5 MG tablet Take 1 tablet (5 mg total) by  mouth daily as needed for erectile dysfunction.     Facility-Administered Medications Prior to Visit  Medication Dose Route Frequency Provider Last Rate Last Admin   sodium chloride  flush (NS) 0.9 % injection 10 mL  10 mL Intracatheter PRN [name] Cottondale, MD   10 mL at 02/15/24 1353    ROS: Review of Systems  Constitutional:  Positive for chills and fatigue. Negative for appetite change and unexpected weight change.  HENT:  Negative for congestion, nosebleeds, sneezing, sore throat and trouble swallowing.   Eyes:  Negative for itching and visual disturbance.  Respiratory:  Negative for cough.   Cardiovascular:  Negative for chest pain, palpitations and leg swelling.  Gastrointestinal:  Negative for abdominal distention, blood in stool, diarrhea and nausea.  Genitourinary:  Negative for decreased urine volume, frequency and hematuria.  Musculoskeletal:  Negative for arthralgias, back pain, gait problem, joint swelling and neck pain.  Skin:  Negative for rash.  Neurological:  Negative for dizziness, tremors, speech difficulty and weakness.  Psychiatric/Behavioral:  Negative for agitation, dysphoric mood, sleep disturbance and suicidal ideas. The patient is not nervous/anxious.     Objective:  BP 122/60   Pulse 61   Temp 97.9 F (36.6 C)   Ht 6\' 2"  (1.88 m)   Wt 200 lb 3.2 oz (90.8 kg)   SpO2 96%  BMI 25.70 kg/m   BP Readings from Last 3 Encounters:  03/19/24 122/60  03/14/24 138/66  03/04/24 (!) 150/83    Wt Readings from Last 3 Encounters:  03/19/24 200 lb 3.2 oz (90.8 kg)  03/14/24 196 lb 11.2 oz (89.2 kg)  03/04/24 200 lb (90.7 kg)    Physical Exam Constitutional:      General: He is not in acute distress.    Appearance: He is well-developed. He is obese.     Comments: NAD  Eyes:     Conjunctiva/sclera: Conjunctivae normal.     Pupils: Pupils are equal, round, and reactive to light.  Neck:     Thyroid : No thyromegaly.     Vascular: No JVD.  Cardiovascular:      Rate and Rhythm: Normal rate and regular rhythm.     Heart sounds: Normal heart sounds. No murmur heard.    No friction rub. No gallop.  Pulmonary:     Effort: Pulmonary effort is normal. No respiratory distress.     Breath sounds: Normal breath sounds. No wheezing or rales.  Chest:     Chest wall: No tenderness.  Abdominal:     General: Bowel sounds are normal. There is no distension.     Palpations: Abdomen is soft. There is no mass.     Tenderness: There is no abdominal tenderness. There is no guarding or rebound.  Musculoskeletal:        General: No tenderness. Normal range of motion.     Cervical back: Normal range of motion.  Lymphadenopathy:     Cervical: No cervical adenopathy.  Skin:    General: Skin is warm and dry.     Findings: No rash.  Neurological:     Mental Status: He is alert and oriented to person, place, and time.     Cranial Nerves: No cranial nerve deficit.     Motor: No abnormal muscle tone.     Coordination: Coordination normal.     Gait: Gait normal.     Deep Tendon Reflexes: Reflexes are normal and symmetric.  Psychiatric:        Behavior: Behavior normal.        Thought Content: Thought content normal.        Judgment: Judgment normal.     Lab Results  Component Value Date   WBC 10.6 (H) 03/14/2024   HGB 11.2 (L) 03/14/2024   HCT 32.1 (L) 03/14/2024   PLT 199 03/14/2024   GLUCOSE 219 (H) 03/14/2024   CHOL 116 02/27/2023   TRIG 150.0 (H) 02/27/2023   HDL 38.60 (L) 02/27/2023   LDLDIRECT 77.0 07/07/2015   LDLCALC 47 02/27/2023   ALT 12 03/14/2024   AST 13 (L) 03/14/2024   NA 135 03/14/2024   K 4.3 03/14/2024   CL 104 03/14/2024   CREATININE 1.57 (H) 03/14/2024   BUN 28 (H) 03/14/2024   CO2 23 03/14/2024   TSH 1.520 02/15/2024   PSA 6.51 (H) 02/27/2023   HGBA1C 7.1 (A) 02/13/2024   MICROALBUR 41.4 02/13/2024    DG Abd 1 View Result Date: 03/01/2024 CLINICAL DATA:  Left ureteral stent placement. EXAM: ABDOMEN - 1 VIEW COMPARISON:   February 05, 2024 FINDINGS: The bowel gas pattern is normal. A left-sided ureteral stent is seen. This is unchanged in position when compared to the prior study. No radio-opaque calculi or other significant radiographic abnormality are seen. Ill-defined surgical sutures are seen overlying the pelvis. IMPRESSION: Left-sided ureteral stent, as described above. Electronically Signed  By: Virgle Grime M.D.   On: 03/01/2024 21:59   CT Renal Stone Study Result Date: 03/01/2024 CLINICAL DATA:  Bladder, prostate, and renal cell carcinoma. Cystoprostatectomy. Chills yesterday. * Tracking Code: BO * EXAM: CT ABDOMEN AND PELVIS WITHOUT CONTRAST TECHNIQUE: Multidetector CT imaging of the abdomen and pelvis was performed following the standard protocol without IV contrast. RADIATION DOSE REDUCTION: This exam was performed according to the departmental dose-optimization program which includes automated exposure control, adjustment of the mA and/or kV according to patient size and/or use of iterative reconstruction technique. COMPARISON:  02/04/2024 FINDINGS: Lower chest: Smooth septal thickening at the lung bases. Normal heart size. Right coronary artery calcification. Aortic valve calcification. Trace bilateral pleural thickening. Hepatobiliary: Normal liver. Normal gallbladder, without biliary ductal dilatation. Pancreas: Normal, without mass or ductal dilatation. Spleen: Normal in size, without focal abnormality. Adrenals/Urinary Tract: Bilateral adrenal thickening is unchanged. Right nephrectomy. No left renal calculi. Mild left-sided hydroureteronephrosis again identified. Followed to the level of the left abdominal wall urinary diversion. Interval placement of a catheter or stent within distal most left ureter. Perinephric edema is moderate, increased. Status post cystectomy. Stomach/Bowel: Normal stomach, without wall thickening. Lipoma within the distal duodenum including at 11 mm without complicating obstruction.  Scattered colonic diverticula. Normal terminal ileum. Normal appendix. Vascular/Lymphatic: Aortic atherosclerosis. No abdominopelvic adenopathy. Reproductive: Prostatectomy. Other: Trace pelvic fluid, minimally increased. No free intraperitoneal air. Musculoskeletal: Degenerative changes of both hips, greater on the right. Lumbosacral spondylosis. IMPRESSION: 1. Status post right nephrectomy and cystoprostatectomy. 2. Similar mild left-sided hydroureteronephrosis, without obstructive cause. Interval placement of a catheter or stent in the distal most ureter, just proximal to the urinary diversion in the left abdominal wall. 3. Increased left renal/perirenal edema which could all be due to hydronephrosis or superimposed pyelonephritis. 4. Increased small volume pelvic fluid. Development of bibasilar septal thickening. Findings suggest fluid overload or mild congestive heart failure. 5. Aortic valvular calcifications. Consider echocardiography to evaluate for valvular dysfunction. 6. Coronary artery atherosclerosis. Aortic Atherosclerosis (ICD10-I70.0). Electronically Signed   By: Lore Rode M.D.   On: 03/01/2024 14:00    Assessment & Plan:   Problem List Items Addressed This Visit     Type 2 diabetes mellitus without complication, without long-term current use of insulin  (HCC) - Primary    Metformin  d/c 2025 s/p L nephrectomy      Bladder cancer (HCC)    Metformin  d/c 2025 s/p L nephrectomy      Relevant Medications   ciprofloxacin  (CIPRO ) 500 MG tablet   UTI (urinary tract infection)   Keep Cipro  at home UTI tests OTC      Chills   Check temp Keep Cipro  at home Use UTI tests OTC      H/O left nephrectomy   Hydrate well No NSAIDs Keep Cipro  at home Use UTI tests OTC      Neuropathy   Diabetic socks         Meds ordered this encounter  Medications   ciprofloxacin  (CIPRO ) 500 MG tablet    Sig: Take 1 tablet (500 mg total) by mouth 2 (two) times daily for 10 days.     Dispense:  20 tablet    Refill:  0      Follow-up: Return in about 3 months (around 06/19/2024) for a follow-up visit.  Anitra Barn, MD

## 2024-03-19 NOTE — Patient Instructions (Addendum)
 Diabetic socks Infrared spa

## 2024-03-20 ENCOUNTER — Other Ambulatory Visit: Payer: Self-pay

## 2024-03-21 ENCOUNTER — Other Ambulatory Visit: Payer: Self-pay

## 2024-03-21 NOTE — Patient Instructions (Signed)
 Visit Information  Thank you for taking time to visit with me today. Please don't hesitate to contact me if I can be of assistance to you before our next scheduled appointment.  Your next care management appointment is by telephone on 04/22/24 at 2:00 pm  Please call the care guide team at [phone] if you need to cancel, schedule, or reschedule an appointment.   Please call the Suicide and Crisis Lifeline: 988 call the [place]  National Suicide Prevention Lifeline: 501 304 1854 or TTY: 3528744230 TTY [phone]) to talk to a trained counselor if you are experiencing a Mental Health or Behavioral Health Crisis or need someone to talk to.  Lindi Revering, RN, MSN, BSN, CCM [place]  [facility], Population Health Case Manager Phone: [phone]

## 2024-03-21 NOTE — Patient Outreach (Signed)
 Complex Care Management   Visit Note  03/21/2024  Name:  Robert [name] MRN: [id number] DOB: May 08, 1951  Situation: Referral received for Complex Care Management related to bladder ca(left ureterostomy) I obtained verbal consent from Patient.  Visit completed with patient  on the phone  Background:   Past Medical History:  Diagnosis Date   Anticoagulant long-term use    eliquis --- managed by cardiology   Arthritis    Bladder cancer [facility])    urologist--- dr Derrick Fling;  overlapping   Cataract    History of basal cell carcinoma (BCC) excision    History of COVID-19 05/2019   per pt mild symptoms that resolved   HLD (hyperlipidemia)    HTN (hypertension)    Hx of colonic polyps    Insulin  dependent type 2 diabetes mellitus [facility])    endocrinologist--- dr Aldona Amel --  (03-11-2022 per pt check blood sugar 1-2 times daily,  fasting sugar-- 118-120s)   Nocturia    Paroxysmal atrial fibrillation (HCC) 03/12/2019   cardiologist--- dr Floria Hurst   Renal disorder    Ureterocele    Use of cane as ambulatory aid     Assessment: Patient denies any questions or concerns at this time and reports Ureterostomy is draining without difficulty. Follow up with Primary care completed 03/19/24.  Patient Reported Symptoms:  Cognitive Cognitive Status: Alert and oriented to person, place, and time, Insightful and able to interpret abstract concepts, Normal speech and language skills      Neurological Neurological Review of Symptoms: Other: [place] Neurological Symptoms/Conditions [RPT]: patient reports neuropathy-unchanged    HEENT HEENT Symptoms Reported: Not assessed      Cardiovascular Cardiovascular Symptoms Reported: No symptoms reported    Respiratory Respiratory Symptoms Reported: No symptoms reported    Endocrine Patient reports the following symptoms related to hypoglycemia or hyperglycemia : No symptoms reported Is patient diabetic?: Yes Is patient checking blood sugars at home?:  Yes Endocrine Conditions: Diabetes  Gastrointestinal Gastrointestinal Symptoms Reported: Not assessed      Genitourinary Genitourinary Symptoms Reported: No symptoms reported Additional Genitourinary Details: Left uretorostomy-per patient draining well. Denies anyquestions or concerns    Integumentary Integumentary Symptoms Reported: Not assessed    Musculoskeletal Musculoskelatal Symptoms Reviewed: Muscle pain, Difficulty walking, Unsteady gait Additional Musculoskeletal Details: per patient unchanged        Psychosocial Other Psychosocial Conditions: primary caregiver for spouse who has dementialpatient with upcoming telephone call scheduled for 03/26/24            03/15/2024    1:51 PM  Depression screen PHQ 2/9  Decreased Interest 0  Down, Depressed, Hopeless 1  PHQ - 2 Score 1    There were no vitals filed for this visit.  Medications Reviewed Today     Reviewed by Yaileen Hofferber M, RN (Registered Nurse) on 03/21/24 at 1415  Med List Status: <None>   Medication Order Taking? Sig Documenting Provider Last Dose Status Informant  amLODipine  (NORVASC ) 5 MG tablet [id number] Yes Take 1 tablet (5 mg total) by mouth daily. Sueellen Emery, MD Taking Active Self           Med Note Guido Leeks, [name]   Fri Mar 01, 2024  6:56 PM) Although the patient said he takes this, I cannot see it's been filled since 08/2023  apixaban  (ELIQUIS ) 5 MG TABS tablet [id number] Yes Take 1 tablet (5 mg total) by mouth 2 (two) times daily. Nahser, Lela Purple, MD Taking Active Self  atorvastatin  (LIPITOR) 40 MG tablet [id number]  Yes Take 1 tablet (40 mg total) by mouth at bedtime. Audria Leather, MD Taking Active Self  B Complex-C (B-COMPLEX WITH VITAMIN C) tablet [id number] Yes Take 1 tablet by mouth 3 (three) times a week. [provider] Taking Active Self           Med Note Mliss Anderson [id number], SUSAN A   Sun Feb 04, 2024  4:51 PM)    Cholecalciferol (VITAMIN D-3 PO) [id number] Yes Take 1 tablet by  mouth 3 (three) times a week. [provider] Taking Active Self           Med Note Guido Leeks, [name]   Fri Mar 01, 2024  6:33 PM) Strength unconfirmed  ciprofloxacin  (CIPRO ) 500 MG tablet [id number] No Take 1 tablet (500 mg total) by mouth 2 (two) times daily for 10 days.  Patient not taking: Reported on 03/21/2024   Plotnikov, Oakley Bellman, MD Not Taking Active   EPINEPHrine  0.3 mg/0.3 mL IJ SOAJ injection [id number] Yes Inject 0.3 mg into the muscle as needed for anaphylaxis. [name], PA-C Taking Active Self  glipiZIDE  (GLUCOTROL ) 5 MG tablet [id number] Yes Take 2.5 mg by mouth daily before breakfast. [provider] Taking Active Self           Med Note Guido Leeks, [name]   Fri Mar 01, 2024  7:03 PM) Although the patient said he takes this, I cannot see it's been filled since 03/2023  glucose blood test strip [id number]  1 each by Other route daily. Use daily for glucose control - Dx 250.00 [name], Estela Y, MD  Active Self  metoprolol  succinate (TOPROL -XL) 25 MG 24 hr tablet [id number] Yes Take 1 tablet (25 mg total) by mouth daily. Nahser, Lela Purple, MD Taking Active Self  propranolol  (INDERAL ) 10 MG tablet [id number] Yes TAKE 1 TABLET BY MOUTH 4 TIMES DAILY AS NEEDED FOR PALPITATIONS, IRREGULAR OR FAST HEART RATE Nahser, Lela Purple, MD Taking Active Self           Med Note Guido Leeks, [name]   Fri Mar 01, 2024  7:02 PM) I was unable to track any last fill of this. The patient stated he takes this intermittently.  Semaglutide , 1 MG/DOSE, (OZEMPIC , 1 MG/DOSE,) 4 MG/3ML SOPN [id number] Yes INJECT 1 MG UNDER THE SKIN ONCE A WEEK AS DIRECTED  Patient taking differently: Inject 1 mg into the skin every Saturday.   Emilie Harden, MD Taking Active Self           Med Note Guido Leeks, [name]   Fri Mar 01, 2024  5:59 PM)    tadalafil  (CIALIS ) 5 MG tablet [id number] Yes Take 1 tablet (5 mg total) by mouth daily as needed for erectile dysfunction. Audria Leather, MD Taking Active Self           Recommendation:   Continue Current Plan of Care  Follow Up Plan:   Telephone follow up appointment date/time:  04/22/24 at 2:00 pm  Lindi Revering, RN, MSN, BSN, CCM [place]  [facility], Population Health Case Manager Phone: [phone]

## 2024-03-26 ENCOUNTER — Telehealth: Payer: Self-pay | Admitting: *Deleted

## 2024-03-26 ENCOUNTER — Other Ambulatory Visit: Payer: Self-pay | Admitting: Licensed Clinical Social Worker

## 2024-03-26 NOTE — Patient Instructions (Signed)
 Visit Information  Thank you for taking time to visit with me today. Please don't hesitate to contact me if I can be of assistance to you before our next scheduled appointment.  Our next appointment is by telephone on 04/16/24 at 2pm Please call the care guide team at [phone] if you need to cancel or reschedule your appointment.   Following is a copy of your care plan:   Goals Addressed             This Visit's Progress    Just recently diagnosed with bladder cancer and I hope everything turns around for me.  I start my treatment in a couple of weeks for 6  weeks.       VBCI Social Work Care Plan-LCSW       Problems:   Lacks knowledge of how to connect to community resources             Care Giver Fatigue              CSW Clinical Goal(s):   Over the next 90 days the Patient will explore community resource options for unmet needs related to caregiver stress AEB patient report of contacts.             Over next 30 days, the Patient will explore options related to emergency planning for long term safety needs  Interventions:  Mental Health:  Evaluation of current treatment plan related to Caregiver Stress Active listening / Reflection utilized Behavioral Activation reviewed Caregiver stress acknowledged :  Consideration on in-home help encouraged : options discussed Crisis Resource Education / information provided Depression screen reviewed Discussed caregiver resources and support:   Emotional Support Provided Problem Solving /Task Center strategies reviewed Quality of sleep assessed & Sleep Hygiene techniques promoted Solution-Focued Strategies employed: Encouraged communication as needed with Elder Sales executive  Patient Goals/Self-Care Activities:  Review resources for caregiver support provided once received by mail  Plan:   LCSW to call client in next 3-4 weeks to assess client needs and  discuss resources for client in the community        Please call  the Suicide and Crisis Lifeline: 988 if you are experiencing a Mental Health or Behavioral Health Crisis or need someone to talk to.  Patient verbalizes understanding of instructions and care plan provided today and agrees to view in MyChart. Active MyChart status and patient understanding of how to access instructions and care plan via MyChart confirmed with patient.     Kolleen Perone, BSW, MSW, LCSW Licensed Clinical Social Worker [employer]   [email or web address] Direct Dial: [phone]

## 2024-03-26 NOTE — Patient Outreach (Signed)
 Complex Care Management   Visit Note  03/26/2024  Name:  Robert [name] MRN: [id number] DOB: 05/26/1951  Situation: Referral received for Complex Care Management related to caregiver stress I obtained verbal consent from Patient.  Visit completed with patient  on the phone  Background:   Past Medical History:  Diagnosis Date   Anticoagulant long-term use    eliquis --- managed by cardiology   Arthritis    Bladder cancer [facility])    urologist--- dr Derrick Fling;  overlapping   Cataract    History of basal cell carcinoma (BCC) excision    History of COVID-19 05/2019   per pt mild symptoms that resolved   HLD (hyperlipidemia)    HTN (hypertension)    Hx of colonic polyps    Insulin  dependent type 2 diabetes mellitus [facility])    endocrinologist--- dr Aldona Amel --  (03-11-2022 per pt check blood sugar 1-2 times daily,  fasting sugar-- 118-120s)   Nocturia    Paroxysmal atrial fibrillation (HCC) 03/12/2019   cardiologist--- dr Floria Hurst   Renal disorder    Ureterocele    Use of cane as ambulatory aid     Assessment: Patient Reported Symptoms:  Cognitive Cognitive Status: Alert and oriented to person, place, and time   Health Maintenance Behaviors: Stress management, Social activities, Spiritual practice(s) (caregiver stress)  Neurological Neurological Review of Symptoms: Other: (neuropathy) Neurological Management Strategies: Coping strategies, Counseling (may be interested in counseling support for caregiver stress, caregivers support group, good self care) Neurological Self-Management Outcome: 4 (good)  HEENT HEENT Symptoms Reported: No symptoms reported      Cardiovascular Cardiovascular Symptoms Reported: Fatigue, Irregular pulse (Afib. Takes Eliquis  as prescribed) Does patient have uncontrolled Hypertension?: No Cardiovascular Conditions: Hypertension Cardiovascular Management Strategies: Exercise, Coping strategies (exercises in stationary bike 3X weeky. Does curls with dumbell  weight) Cardiovascular Self-Management Outcome: 4 (good)  Respiratory Respiratory Symptoms Reported: No symptoms reported    Endocrine Patient reports the following symptoms related to hypoglycemia or hyperglycemia : No symptoms reported Is patient diabetic?: Yes Is patient checking blood sugars at home?: Yes Endocrine Conditions: Diabetes Endocrine Management Strategies: Activity, Coping strategies, Diet modification, Exercise, Medication therapy Endocrine Self-Management Outcome: 4 (good)  Gastrointestinal Gastrointestinal Symptoms Reported: No symptoms reported Gastrointestinal Self-Management Outcome: 4 (good)    Genitourinary Genitourinary Symptoms Reported: Other Other Genitourinary Symptoms: has ostomy since Feb.14, 2025 Genitourinary Conditions: Kidney cancer Genitourinary Management Strategies: Medical device, Urostomy Genitourinary Self-Management Outcome: 5 (very good)  Integumentary Integumentary Symptoms Reported: No symptoms reported Skin Self-Management Outcome: 5 (very good)  Musculoskeletal Musculoskelatal Symptoms Reviewed: Muscle pain Musculoskeletal Conditions: Osteoarthritis Musculoskeletal Management Strategies: Coping strategies Falls in the past year?: No    Psychosocial       Quality of Family Relationships: supportive Do you feel physically threatened by others?: No      03/26/2024    3:21 PM  Depression screen PHQ 2/9  Decreased Interest 0  Down, Depressed, Hopeless 1  PHQ - 2 Score 1      03/26/2024    3:23 PM 03/15/2024    1:48 PM  GAD 7 : Generalized Anxiety Score  Nervous, Anxious, on Edge 0 1  Control/stop worrying 0 0  Worry too much - different things 0 0  Trouble relaxing 1 0  Restless 1 0  Easily annoyed or irritable 0 0  Afraid - awful might happen 0 0  Total GAD 7 Score 2 1  Anxiety Difficulty Somewhat difficult       Vitals:  Medications Reviewed Today     Reviewed by Elie Grove, LCSW (Social Worker) on 03/26/24 at  1502  Med List Status: <None>   Medication Order Taking? Sig Documenting Provider Last Dose Status Informant  amLODipine  (NORVASC ) 5 MG tablet [id number] No Take 1 tablet (5 mg total) by mouth daily.  Patient not taking: Reported on 03/26/2024   Sueellen Emery, MD Not Taking Active Self           Med Note Guido Leeks, [name]   Fri Mar 01, 2024  6:56 PM) Although the patient said he takes this, I cannot see it's been filled since 08/2023  apixaban  (ELIQUIS ) 5 MG TABS tablet [id number] Yes Take 1 tablet (5 mg total) by mouth 2 (two) times daily. Nahser, Lela Purple, MD Taking Active Self  atorvastatin  (LIPITOR) 40 MG tablet [id number] Yes Take 1 tablet (40 mg total) by mouth at bedtime. Audria Leather, MD Taking Active Self  B Complex-C (B-COMPLEX WITH VITAMIN C) tablet [id number] Yes Take 1 tablet by mouth 3 (three) times a week. [provider] Taking Active Self           Med Note Mliss Anderson [id number], SUSAN A   Sun Feb 04, 2024  4:51 PM)    Cholecalciferol (VITAMIN D-3 PO) [id number] Yes Take 1 tablet by mouth 3 (three) times a week. [provider] Taking Active Self           Med Note Guido Leeks, [name]   Fri Mar 01, 2024  6:33 PM) Strength unconfirmed  ciprofloxacin  (CIPRO ) 500 MG tablet [id number] No Take 1 tablet (500 mg total) by mouth 2 (two) times daily for 10 days.  Patient not taking: Reported on 03/26/2024   Plotnikov, Oakley Bellman, MD Not Taking Active   EPINEPHrine  0.3 mg/0.3 mL IJ SOAJ injection [id number]  Inject 0.3 mg into the muscle as needed for anaphylaxis. [name], PA-C  Active Self  glipiZIDE  (GLUCOTROL ) 5 MG tablet [id number] No Take 2.5 mg by mouth daily before breakfast.  Patient not taking: Reported on 03/26/2024   [provider] Not Taking Active Self           Med Note Guido Leeks, [name]   Fri Mar 01, 2024  7:03 PM) Although the patient said he takes this, I cannot see it's been filled since 03/2023  glucose blood test strip [id number] Yes 1 each by Other route  daily. Use daily for glucose control - Dx 250.00 [name], [name], MD Taking Active Self  metoprolol  succinate (TOPROL -XL) 25 MG 24 hr tablet [id number] Yes Take 1 tablet (25 mg total) by mouth daily. Nahser, Lela Purple, MD Taking Active Self  propranolol  (INDERAL ) 10 MG tablet [id number] Yes TAKE 1 TABLET BY MOUTH 4 TIMES DAILY AS NEEDED FOR PALPITATIONS, IRREGULAR OR FAST HEART RATE Nahser, Lela Purple, MD Taking Active Self           Med Note Guido Leeks, [name]   Fri Mar 01, 2024  7:02 PM) I was unable to track any last fill of this. The patient stated he takes this intermittently.  Semaglutide , 1 MG/DOSE, (OZEMPIC , 1 MG/DOSE,) 4 MG/3ML SOPN [id number] Yes INJECT 1 MG UNDER THE SKIN ONCE A WEEK AS DIRECTED  Patient taking differently: Inject 1 mg into the skin every Saturday.   Emilie Harden, MD Taking Active Self           Med Note Guido Leeks, [name]   Fri Mar 01, 2024  5:59 PM)  tadalafil  (CIALIS ) 5 MG tablet [id number] No Take 1 tablet (5 mg total) by mouth daily as needed for erectile dysfunction.  Patient not taking: Reported on 03/26/2024   Audria Leather, MD Not Taking Active Self            Recommendation:   Review resources in community once received via mail : caregiver stress resources, Day Program Support Allow time  for self care:  meals on schedule, adequate rest, hobbies of choice  Follow Up Plan:   LCSW to contact client in 3-4 weeks to assess client needs and discuss client access to community resources  Kolleen Perone, BSW, MSW, LCSW Licensed Clinical Social Worker [employer]   [email or web address] Direct Dial: [phone]

## 2024-03-27 ENCOUNTER — Encounter (HOSPITAL_COMMUNITY): Payer: Self-pay

## 2024-03-27 ENCOUNTER — Emergency Department (HOSPITAL_COMMUNITY)

## 2024-03-27 ENCOUNTER — Inpatient Hospital Stay (HOSPITAL_COMMUNITY)
Admission: EM | Admit: 2024-03-27 | Discharge: 2024-04-03 | DRG: 552 | Disposition: A | Discharge status: Home / Self Care | Attending: Internal Medicine | Admitting: Internal Medicine

## 2024-03-27 DIAGNOSIS — Z85828 Personal history of other malignant neoplasm of skin: Secondary | ICD-10-CM

## 2024-03-27 DIAGNOSIS — M549 Dorsalgia, unspecified: Secondary | ICD-10-CM | POA: Diagnosis not present

## 2024-03-27 DIAGNOSIS — E871 Hypo-osmolality and hyponatremia: Secondary | ICD-10-CM | POA: Diagnosis present

## 2024-03-27 DIAGNOSIS — N1831 Chronic kidney disease, stage 3a: Secondary | ICD-10-CM | POA: Diagnosis present

## 2024-03-27 DIAGNOSIS — Z800 Family history of malignant neoplasm of digestive organs: Secondary | ICD-10-CM | POA: Diagnosis not present

## 2024-03-27 DIAGNOSIS — K5730 Diverticulosis of large intestine without perforation or abscess without bleeding: Secondary | ICD-10-CM | POA: Diagnosis not present

## 2024-03-27 DIAGNOSIS — N183 Chronic kidney disease, stage 3 unspecified: Secondary | ICD-10-CM | POA: Diagnosis not present

## 2024-03-27 DIAGNOSIS — I10 Essential (primary) hypertension: Secondary | ICD-10-CM | POA: Diagnosis not present

## 2024-03-27 DIAGNOSIS — Z8616 Personal history of COVID-19: Secondary | ICD-10-CM

## 2024-03-27 DIAGNOSIS — Z7984 Long term (current) use of oral hypoglycemic drugs: Secondary | ICD-10-CM

## 2024-03-27 DIAGNOSIS — Z9079 Acquired absence of other genital organ(s): Secondary | ICD-10-CM

## 2024-03-27 DIAGNOSIS — Y732 Prosthetic and other implants, materials and accessory gastroenterology and urology devices associated with adverse incidents: Secondary | ICD-10-CM | POA: Diagnosis present

## 2024-03-27 DIAGNOSIS — I480 Paroxysmal atrial fibrillation: Secondary | ICD-10-CM | POA: Diagnosis present

## 2024-03-27 DIAGNOSIS — M5116 Intervertebral disc disorders with radiculopathy, lumbar region: Secondary | ICD-10-CM | POA: Diagnosis present

## 2024-03-27 DIAGNOSIS — T83122A Displacement of urinary stent, initial encounter: Secondary | ICD-10-CM | POA: Diagnosis present

## 2024-03-27 DIAGNOSIS — N390 Urinary tract infection, site not specified: Secondary | ICD-10-CM | POA: Diagnosis not present

## 2024-03-27 DIAGNOSIS — M48061 Spinal stenosis, lumbar region without neurogenic claudication: Secondary | ICD-10-CM | POA: Diagnosis present

## 2024-03-27 DIAGNOSIS — E119 Type 2 diabetes mellitus without complications: Secondary | ICD-10-CM | POA: Diagnosis not present

## 2024-03-27 DIAGNOSIS — M5441 Lumbago with sciatica, right side: Principal | ICD-10-CM

## 2024-03-27 DIAGNOSIS — Z8551 Personal history of malignant neoplasm of bladder: Secondary | ICD-10-CM | POA: Diagnosis not present

## 2024-03-27 DIAGNOSIS — B952 Enterococcus as the cause of diseases classified elsewhere: Secondary | ICD-10-CM | POA: Diagnosis present

## 2024-03-27 DIAGNOSIS — Z9221 Personal history of antineoplastic chemotherapy: Secondary | ICD-10-CM

## 2024-03-27 DIAGNOSIS — R739 Hyperglycemia, unspecified: Secondary | ICD-10-CM | POA: Diagnosis not present

## 2024-03-27 DIAGNOSIS — Z7901 Long term (current) use of anticoagulants: Secondary | ICD-10-CM

## 2024-03-27 DIAGNOSIS — Z794 Long term (current) use of insulin: Secondary | ICD-10-CM | POA: Diagnosis not present

## 2024-03-27 DIAGNOSIS — G629 Polyneuropathy, unspecified: Secondary | ICD-10-CM

## 2024-03-27 DIAGNOSIS — M47816 Spondylosis without myelopathy or radiculopathy, lumbar region: Secondary | ICD-10-CM | POA: Diagnosis not present

## 2024-03-27 DIAGNOSIS — T80219A Unspecified infection due to central venous catheter, initial encounter: Secondary | ICD-10-CM | POA: Diagnosis not present

## 2024-03-27 DIAGNOSIS — Z905 Acquired absence of kidney: Secondary | ICD-10-CM | POA: Diagnosis not present

## 2024-03-27 DIAGNOSIS — C679 Malignant neoplasm of bladder, unspecified: Secondary | ICD-10-CM | POA: Diagnosis present

## 2024-03-27 DIAGNOSIS — I340 Nonrheumatic mitral (valve) insufficiency: Secondary | ICD-10-CM | POA: Diagnosis not present

## 2024-03-27 DIAGNOSIS — N136 Pyonephrosis: Secondary | ICD-10-CM | POA: Diagnosis present

## 2024-03-27 DIAGNOSIS — C675 Malignant neoplasm of bladder neck: Secondary | ICD-10-CM | POA: Diagnosis not present

## 2024-03-27 DIAGNOSIS — Z888 Allergy status to other drugs, medicaments and biological substances status: Secondary | ICD-10-CM

## 2024-03-27 DIAGNOSIS — M4807 Spinal stenosis, lumbosacral region: Secondary | ICD-10-CM | POA: Diagnosis not present

## 2024-03-27 DIAGNOSIS — M5416 Radiculopathy, lumbar region: Secondary | ICD-10-CM | POA: Diagnosis present

## 2024-03-27 DIAGNOSIS — R7881 Bacteremia: Secondary | ICD-10-CM | POA: Diagnosis not present

## 2024-03-27 DIAGNOSIS — Z87891 Personal history of nicotine dependence: Secondary | ICD-10-CM | POA: Diagnosis not present

## 2024-03-27 DIAGNOSIS — E1122 Type 2 diabetes mellitus with diabetic chronic kidney disease: Secondary | ICD-10-CM | POA: Diagnosis present

## 2024-03-27 DIAGNOSIS — I129 Hypertensive chronic kidney disease with stage 1 through stage 4 chronic kidney disease, or unspecified chronic kidney disease: Secondary | ICD-10-CM | POA: Diagnosis present

## 2024-03-27 DIAGNOSIS — I4891 Unspecified atrial fibrillation: Secondary | ICD-10-CM | POA: Diagnosis not present

## 2024-03-27 DIAGNOSIS — D631 Anemia in chronic kidney disease: Secondary | ICD-10-CM | POA: Diagnosis present

## 2024-03-27 DIAGNOSIS — Z936 Other artificial openings of urinary tract status: Secondary | ICD-10-CM

## 2024-03-27 DIAGNOSIS — E785 Hyperlipidemia, unspecified: Secondary | ICD-10-CM | POA: Diagnosis present

## 2024-03-27 DIAGNOSIS — B9689 Other specified bacterial agents as the cause of diseases classified elsewhere: Secondary | ICD-10-CM | POA: Diagnosis not present

## 2024-03-27 DIAGNOSIS — I38 Endocarditis, valve unspecified: Secondary | ICD-10-CM | POA: Diagnosis not present

## 2024-03-27 DIAGNOSIS — M1611 Unilateral primary osteoarthritis, right hip: Secondary | ICD-10-CM

## 2024-03-27 DIAGNOSIS — Z79899 Other long term (current) drug therapy: Secondary | ICD-10-CM

## 2024-03-27 DIAGNOSIS — I351 Nonrheumatic aortic (valve) insufficiency: Secondary | ICD-10-CM | POA: Diagnosis not present

## 2024-03-27 DIAGNOSIS — M5126 Other intervertebral disc displacement, lumbar region: Secondary | ICD-10-CM | POA: Diagnosis not present

## 2024-03-27 DIAGNOSIS — N1330 Unspecified hydronephrosis: Secondary | ICD-10-CM | POA: Diagnosis not present

## 2024-03-27 DIAGNOSIS — R109 Unspecified abdominal pain: Secondary | ICD-10-CM | POA: Diagnosis not present

## 2024-03-27 LAB — CBC WITH DIFFERENTIAL/PLATELET
Abs Immature Granulocytes: 0.14 10*3/uL — ABNORMAL HIGH (ref 0.00–0.07)
Basophils Absolute: 0 10*3/uL (ref 0.0–0.1)
Basophils Relative: 0 %
Eosinophils Absolute: 0 10*3/uL (ref 0.0–0.5)
Eosinophils Relative: 0 %
HCT: 31.2 % — ABNORMAL LOW (ref 39.0–52.0)
Hemoglobin: 10.6 g/dL — ABNORMAL LOW (ref 13.0–17.0)
Immature Granulocytes: 1 %
Lymphocytes Relative: 13 %
Lymphs Abs: 1.7 10*3/uL (ref 0.7–4.0)
MCH: 28.4 pg (ref 26.0–34.0)
MCHC: 34 g/dL (ref 30.0–36.0)
MCV: 83.6 fL (ref 80.0–100.0)
Monocytes Absolute: 1.2 10*3/uL — ABNORMAL HIGH (ref 0.1–1.0)
Monocytes Relative: 9 %
Neutro Abs: 10.4 10*3/uL — ABNORMAL HIGH (ref 1.7–7.7)
Neutrophils Relative %: 77 %
Platelets: 171 10*3/uL (ref 150–400)
RBC: 3.73 MIL/uL — ABNORMAL LOW (ref 4.22–5.81)
RDW: 14.3 % (ref 11.5–15.5)
WBC: 13.4 10*3/uL — ABNORMAL HIGH (ref 4.0–10.5)
nRBC: 0 % (ref 0.0–0.2)

## 2024-03-27 LAB — URINALYSIS, ROUTINE W REFLEX MICROSCOPIC
Bilirubin Urine: NEGATIVE
Glucose, UA: 150 mg/dL — AB
Ketones, ur: NEGATIVE mg/dL
Nitrite: POSITIVE — AB
Protein, ur: 100 mg/dL — AB
Specific Gravity, Urine: 1.017 (ref 1.005–1.030)
pH: 5 (ref 5.0–8.0)

## 2024-03-27 LAB — COMPREHENSIVE METABOLIC PANEL WITH GFR
ALT: 13 U/L (ref 0–44)
AST: 19 U/L (ref 15–41)
Albumin: 3.5 g/dL (ref 3.5–5.0)
Alkaline Phosphatase: 81 U/L (ref 38–126)
Anion gap: 12 (ref 5–15)
BUN: 26 mg/dL — ABNORMAL HIGH (ref 8–23)
CO2: 20 mmol/L — ABNORMAL LOW (ref 22–32)
Calcium: 9.1 mg/dL (ref 8.9–10.3)
Chloride: 98 mmol/L (ref 98–111)
Creatinine, Ser: 1.54 mg/dL — ABNORMAL HIGH (ref 0.61–1.24)
GFR, Estimated: 47 mL/min — ABNORMAL LOW (ref >60)
Glucose, Bld: 172 mg/dL — ABNORMAL HIGH (ref 70–99)
Potassium: 4.1 mmol/L (ref 3.5–5.1)
Sodium: 130 mmol/L — ABNORMAL LOW (ref 135–145)
Total Bilirubin: 0.9 mg/dL (ref 0.0–1.2)
Total Protein: 7.8 g/dL (ref 6.5–8.1)

## 2024-03-27 MED ORDER — SODIUM CHLORIDE 0.9 % IV SOLN
1.0000 g | Freq: Once | INTRAVENOUS | Status: AC
  Administered 2024-03-27: 1 g via INTRAVENOUS
  Filled 2024-03-27: qty 10

## 2024-03-27 MED ORDER — SODIUM CHLORIDE 0.9 % IV BOLUS
500.0000 mL | Freq: Once | INTRAVENOUS | Status: AC
Start: 2024-03-27 — End: 2024-03-27
  Administered 2024-03-27: 500 mL via INTRAVENOUS

## 2024-03-27 MED ORDER — MORPHINE SULFATE (PF) 4 MG/ML IV SOLN
4.0000 mg | Freq: Once | INTRAVENOUS | Status: AC
  Administered 2024-03-27: 4 mg via INTRAVENOUS
  Filled 2024-03-27: qty 1

## 2024-03-27 MED ORDER — HYDROMORPHONE HCL 1 MG/ML IJ SOLN
1.0000 mg | Freq: Once | INTRAMUSCULAR | Status: AC
  Administered 2024-03-27: 1 mg via INTRAVENOUS
  Filled 2024-03-27: qty 1

## 2024-03-27 MED ORDER — GADOBUTROL 1 MMOL/ML IV SOLN
9.0000 mL | Freq: Once | INTRAVENOUS | Status: AC | PRN
  Administered 2024-03-27: 9 mL via INTRAVENOUS

## 2024-03-27 MED ORDER — DEXAMETHASONE SODIUM PHOSPHATE 10 MG/ML IJ SOLN
8.0000 mg | Freq: Once | INTRAMUSCULAR | Status: AC
  Administered 2024-03-28: 8 mg via INTRAVENOUS
  Filled 2024-03-27: qty 1

## 2024-03-27 NOTE — ED Provider Notes (Signed)
 [place] EMERGENCY DEPARTMENT AT [facility] Provider Note   CSN: [id number] Arrival date & time: 03/27/24  1316     History  Chief Complaint  Patient presents with   Back Pain    Robert [name] is a 73 y.o. male.  73 year old male with prior medical history as detailed below presents for evaluation.  Patient complains of low back pain with radiation into the right lower extremity extremity.  Patient with history of paroxysmal A-fib on Eliquis , HTN, HLD metastatic bladder cancer s/p cystoprostatectomy, total ureterectomy and right nephroureterectomy and solitary left kidney s/p ureterostomy.  He denies fever.  Denies weakness.  He reports significant difficulty with sitting or ambulating secondary to pain.  The history is provided by the patient.       Home Medications Prior to Admission medications   Medication Sig Start Date End Date Taking? Authorizing Provider  amLODipine  (NORVASC ) 5 MG tablet Take 1 tablet (5 mg total) by mouth daily. Patient not taking: Reported on 03/26/2024 09/13/23   Sueellen Emery, MD  apixaban  (ELIQUIS ) 5 MG TABS tablet Take 1 tablet (5 mg total) by mouth 2 (two) times daily. 12/21/23   Nahser, Lela Purple, MD  atorvastatin  (LIPITOR) 40 MG tablet Take 1 tablet (40 mg total) by mouth at bedtime. 02/07/24   Audria Leather, MD  B Complex-C (B-COMPLEX WITH VITAMIN C) tablet Take 1 tablet by mouth 3 (three) times a week.    [provider]  Cholecalciferol (VITAMIN D-3 PO) Take 1 tablet by mouth 3 (three) times a week.    [provider]  ciprofloxacin  (CIPRO ) 500 MG tablet Take 1 tablet (500 mg total) by mouth 2 (two) times daily for 10 days. Patient not taking: Reported on 03/26/2024 03/19/24 03/29/24  Plotnikov, Oakley Bellman, MD  EPINEPHrine  0.3 mg/0.3 mL IJ SOAJ injection Inject 0.3 mg into the muscle as needed for anaphylaxis. 09/14/23   [name], PA-C  glipiZIDE  (GLUCOTROL ) 5 MG tablet Take 2.5 mg by mouth daily before  breakfast. Patient not taking: Reported on 03/26/2024    [provider]  glucose blood test strip 1 each by Other route daily. Use daily for glucose control - Dx 250.00 04/21/20   [name], [name], MD  metoprolol  succinate (TOPROL -XL) 25 MG 24 hr tablet Take 1 tablet (25 mg total) by mouth daily. 09/19/23   Nahser, Lela Purple, MD  propranolol  (INDERAL ) 10 MG tablet TAKE 1 TABLET BY MOUTH 4 TIMES DAILY AS NEEDED FOR PALPITATIONS, IRREGULAR OR FAST HEART RATE 10/25/22   Nahser, Lela Purple, MD  Semaglutide , 1 MG/DOSE, (OZEMPIC , 1 MG/DOSE,) 4 MG/3ML SOPN INJECT 1 MG UNDER THE SKIN ONCE A WEEK AS DIRECTED Patient taking differently: Inject 1 mg into the skin every Saturday. 07/20/23   Emilie Harden, MD  tadalafil  (CIALIS ) 5 MG tablet Take 1 tablet (5 mg total) by mouth daily as needed for erectile dysfunction. Patient not taking: Reported on 03/26/2024 02/07/24   Audria Leather, MD      Allergies    Lisinopril     Review of Systems   Review of Systems  All other systems reviewed and are negative.   Physical Exam Updated Vital Signs BP 135/75 (BP Location: Left Arm)   Pulse 82   Temp 98.3 F (36.8 C) (Oral)   Resp 16   Ht 6\' 3"  (1.905 m)   Wt 90.7 kg   SpO2 99%   BMI 25.00 kg/m  Physical Exam Vitals and nursing note reviewed.  Constitutional:      General: He is not in acute distress.    Appearance: Normal appearance. He is well-developed.  HENT:     Head: Normocephalic and atraumatic.  Eyes:     Conjunctiva/sclera: Conjunctivae normal.     Pupils: Pupils are equal, round, and reactive to light.  Cardiovascular:     Rate and Rhythm: Normal rate and regular rhythm.     Heart sounds: Normal heart sounds.  Pulmonary:     Effort: Pulmonary effort is normal. No respiratory distress.     Breath sounds: Normal breath sounds.  Abdominal:     General: There is no distension.     Palpations: Abdomen is soft.     Tenderness: There is no abdominal tenderness.   Musculoskeletal:        General: No deformity. Normal range of motion.     Cervical back: Normal range of motion and neck supple.     Comments: Localizes pain to the right low back with radiation to the right lower extremity.  He reports pain radiating to the right toes.  Bilateral lower extremities with intact strength.  Sensation intact bilaterally on the lower extremities as well.  Skin:    General: Skin is warm and dry.  Neurological:     General: No focal deficit present.     Mental Status: He is alert and oriented to person, place, and time.     ED Results / Procedures / Treatments   Labs (all labs ordered are listed, but only abnormal results are displayed) Labs Reviewed  URINALYSIS, ROUTINE W REFLEX MICROSCOPIC - Abnormal; Notable for the following components:      Result Value   APPearance HAZY (*)    Glucose, UA 150 (*)    Hgb urine dipstick SMALL (*)    Protein, ur 100 (*)    Nitrite POSITIVE (*)    Leukocytes,Ua MODERATE (*)    Bacteria, UA RARE (*)    All other components within normal limits  CBC WITH DIFFERENTIAL/PLATELET - Abnormal; Notable for the following components:   WBC 13.4 (*)    RBC 3.73 (*)    Hemoglobin 10.6 (*)    HCT 31.2 (*)    Neutro Abs 10.4 (*)    Monocytes Absolute 1.2 (*)    Abs Immature Granulocytes 0.14 (*)    All other components within normal limits  COMPREHENSIVE METABOLIC PANEL WITH GFR - Abnormal; Notable for the following components:   Sodium 130 (*)    CO2 20 (*)    Glucose, Bld 172 (*)    BUN 26 (*)    Creatinine, Ser 1.54 (*)    GFR, Estimated 47 (*)    All other components within normal limits  URINE CULTURE  CULTURE, BLOOD (ROUTINE X 2)  CULTURE, BLOOD (ROUTINE X 2)    EKG None  Radiology MR Lumbar Spine W Wo Contrast Result Date: 03/27/2024 CLINICAL DATA:  Low back pain, cancer suspected Low back pain, cauda equina syndrome suspected. EXAM: MRI LUMBAR SPINE WITHOUT AND WITH CONTRAST TECHNIQUE: Multiplanar and  multiecho pulse sequences of the lumbar spine were obtained without and with intravenous contrast. CONTRAST:  9mL GADAVIST  GADOBUTROL  1 MMOL/ML IV SOLN COMPARISON:  None Available. FINDINGS: Segmentation:  Standard. Alignment: No substantial sagittal subluxation. Vertebrae: Right eccentric degenerative/discogenic endplate signal change at L4-L5. No specific evidence of acute fracture or discitis/osteomyelitis. No suspicious bone lesion. Conus medullaris and cauda equina: Conus extends to the L1-L2 level. Conus and cauda equina appear normal.  Paraspinal and other soft tissues: Mild left hydronephrosis. Disc levels: T12-L1: No significant disc protrusion, foraminal stenosis, or canal stenosis. L1-L2: Left eccentric disc protrusion results in mild left foraminal stenosis. Patent canal and right foramen. L2-L3: Mild disc bulging. Mild facet arthropathy. No significant canal or foraminal stenosis L3-L4: Broad disc bulge with superimposed right subarticular disc protrusion which extends inferiorly. Bilateral facet arthropathy. Resulting moderate to severe right subarticular recess stenosis with potential impingement of descending right L4 nerve root. Mild to moderate canal stenosis. Mild right foraminal stenosis. L4-L5: Broad disc bulge with right subarticular disc protrusion. Bilateral facet arthropathy. Resulting moderate right subarticular recess stenosis with potential impingement of the descending right L5 nerve roots. Mild canal stenosis. Moderate right and mild-to-moderate left foraminal stenosis. L5-S1: Right eccentric disc bulge and bilateral facet arthropathy. Superiorly directed right subarticular disc protrusion. Patent canal. Moderate left foraminal stenosis. IMPRESSION: 1. At L3-L4, right subarticular disc protrusion results in moderate to severe right subarticular recess stenosis with potential impingement of descending right L4 nerve root. Mild to moderate canal stenosis and mild right foraminal stenosis.  2. At L4-L5, right subarticular disc protrusion results in moderate right subarticular recess stenosis with potential impingement of descending right L5 nerve roots. Mild canal stenosis. Moderate right and mild-to-moderate left foraminal stenosis. 3. At L5-S1, moderate left foraminal stenosis. 4. Mild left hydronephrosis, better characterized on recent CT Electronically Signed   By: Stevenson Elbe M.D.   On: 03/27/2024 21:38   CT Renal Stone Study Result Date: 03/27/2024 CLINICAL DATA:  Abdominal and flank pain. EXAM: CT ABDOMEN AND PELVIS WITHOUT CONTRAST TECHNIQUE: Multidetector CT imaging of the abdomen and pelvis was performed following the standard protocol without IV contrast. RADIATION DOSE REDUCTION: This exam was performed according to the departmental dose-optimization program which includes automated exposure control, adjustment of the mA and/or kV according to patient size and/or use of iterative reconstruction technique. COMPARISON:  CT abdomen and pelvis 03/01/2024. FINDINGS: Lower chest: No acute abnormality. Hepatobiliary: No focal liver abnormality is seen. No gallstones, gallbladder wall thickening, or biliary dilatation. Pancreas: Unremarkable. No pancreatic ductal dilatation or surrounding inflammatory changes. Spleen: Normal in size without focal abnormality. Adrenals/Urinary Tract: Patient is status post right nephrectomy and cystectomy. Left-sided urostomy present. A left ureteral stent is present. The proximal portion of the stent is coiled in the proximal left ureter. There is mild to moderate left-sided hydronephrosis with contrast seen in the left renal collecting system. There is mild left perinephric fat stranding. No contrast is seen distal to this point. The adrenal glands are within normal limits. Stomach/Bowel: Stomach is within normal limits. Appendix appears normal. No evidence of bowel wall thickening, distention, or inflammatory changes. Colonic diverticulosis is present.  Vascular/Lymphatic: Aortic atherosclerosis. No enlarged abdominal or pelvic lymph nodes. Reproductive: Prostate gland is surgically absent. Other: There is a small amount of free fluid in the pelvis similar to the prior study. There is no focal abdominal wall hernia. Musculoskeletal: Degenerative changes affect the spine. There severe degenerative changes of the right hip. IMPRESSION: 1. Left ureteral stent is coiled in the proximal left ureter. There is mild to moderate left-sided hydronephrosis with contrast seen in the left renal collecting system only. Obstructive process not excluded secondary to stent positioning. Recommend clinical correlation and follow-up. 2. Left-sided urostomy. 3. Status post right nephrectomy and cystectomy. 4. Stable small amount of free fluid in the pelvis. 5. Colonic diverticulosis. 6. Aortic atherosclerosis. Aortic Atherosclerosis (ICD10-I70.0). Electronically Signed   By: Rollen Clines.D.  On: 03/27/2024 19:09    Procedures Procedures    Medications Ordered in ED Medications  cefTRIAXone  (ROCEPHIN ) 1 g in sodium chloride  0.9 % 100 mL IVPB (1 g Intravenous New Bag/Given 03/27/24 1905)  morphine  (PF) 4 MG/ML injection 4 mg (4 mg Intravenous Given 03/27/24 1841)  sodium chloride  0.9 % bolus 500 mL (500 mLs Intravenous New Bag/Given 03/27/24 1905)  gadobutrol  (GADAVIST ) 1 MMOL/ML injection 9 mL (9 mLs Intravenous Contrast Given 03/27/24 1831)    ED Course/ Medical Decision Making/ A&P Clinical Course as of 03/27/24 1924  Wed Mar 27, 2024  1421 Urinalysis, Routine w reflex microscopic -Urine, Clean Catch(!) +UTI [HN]  1502 WBC(!): 13.4 +leukcytosis w/ left shift [HN]  1552 Urinalysis, Routine w reflex microscopic -Urine, Clean Catch(!) Sample drawn from urostomy bag [HN]    Clinical Course User Index [HN] Merdis Stalling, MD                                 Medical Decision Making Amount and/or Complexity of Data Reviewed Labs: ordered. Decision-making details  documented in ED Course. Radiology: ordered.  Risk Prescription drug management.    Medical Screen Complete  This patient presented to the ED with complaint of back pain.  This complaint involves an extensive number of treatment options. The initial differential diagnosis includes, but is not limited to, sciatica, metabolic abnormality, renal dysfunction, etc.  This presentation is: Acute, Self-Limited, Previously Undiagnosed, Uncertain Prognosis, Complicated, Systemic Symptoms, and Threat to Life/Bodily Function  Presents with severe right low back pain.  Patient's described symptoms are most suggestive of likely lumbar spine radiculopathy.  Obtain CT imaging suggest some displacement of ureteral stone.  This was discussed with urology.  Urology does not feel the patient requires acute urologic intervention at this time.  MRI imaging results discussed with neurosurgery/spine (Meyran).  They agree with plan for control of pain with narcotics.  They recommend steroids.  IR consultation for epidural steroid injection may be of benefit.  Patient's pain is poorly controlled despite ED intervention.  Patient would benefit for admission for pain control.  Hospitalist service made aware of case.   Additional history obtained:  External records from outside sources obtained and reviewed including prior ED visits and prior Inpatient records.   Problem List / ED Course:  Back pain   Reevaluation:  After the interventions noted above, I reevaluated the patient and found that they have: stayed the same   Disposition:  After consideration of the diagnostic results and the patients response to treatment, I feel that the patent would benefit from admission.          Final Clinical Impression(s) / ED Diagnoses Final diagnoses:  Acute low back pain with right-sided sciatica, unspecified back pain laterality    Rx / DC Orders ED Discharge Orders     None          Burnette Carte, MD 03/27/24 2332

## 2024-03-27 NOTE — ED Triage Notes (Addendum)
 Per EMS, Pt, from home, c/o low back pain radiating down both legs, but worse on the right.  Hx of bladder CA and R nephroureterectomy.  Additionally, Pt has a R hip injury.   Denies injury.   Pain is worse w/ movement.

## 2024-03-27 NOTE — ED Notes (Signed)
Pt is in MRI  

## 2024-03-28 ENCOUNTER — Ambulatory Visit: Admitting: Hematology

## 2024-03-28 ENCOUNTER — Other Ambulatory Visit

## 2024-03-28 ENCOUNTER — Other Ambulatory Visit: Payer: Self-pay

## 2024-03-28 ENCOUNTER — Ambulatory Visit

## 2024-03-28 DIAGNOSIS — M5416 Radiculopathy, lumbar region: Secondary | ICD-10-CM | POA: Diagnosis present

## 2024-03-28 DIAGNOSIS — C679 Malignant neoplasm of bladder, unspecified: Secondary | ICD-10-CM | POA: Diagnosis present

## 2024-03-28 DIAGNOSIS — Z8551 Personal history of malignant neoplasm of bladder: Secondary | ICD-10-CM | POA: Diagnosis not present

## 2024-03-28 DIAGNOSIS — I10 Essential (primary) hypertension: Secondary | ICD-10-CM | POA: Diagnosis not present

## 2024-03-28 DIAGNOSIS — Z87891 Personal history of nicotine dependence: Secondary | ICD-10-CM | POA: Diagnosis not present

## 2024-03-28 DIAGNOSIS — Z9221 Personal history of antineoplastic chemotherapy: Secondary | ICD-10-CM | POA: Diagnosis not present

## 2024-03-28 DIAGNOSIS — T80219A Unspecified infection due to central venous catheter, initial encounter: Secondary | ICD-10-CM | POA: Diagnosis not present

## 2024-03-28 DIAGNOSIS — N390 Urinary tract infection, site not specified: Secondary | ICD-10-CM | POA: Diagnosis not present

## 2024-03-28 DIAGNOSIS — I38 Endocarditis, valve unspecified: Secondary | ICD-10-CM | POA: Diagnosis not present

## 2024-03-28 DIAGNOSIS — T83122A Displacement of urinary stent, initial encounter: Secondary | ICD-10-CM | POA: Diagnosis present

## 2024-03-28 DIAGNOSIS — Z800 Family history of malignant neoplasm of digestive organs: Secondary | ICD-10-CM | POA: Diagnosis not present

## 2024-03-28 DIAGNOSIS — I340 Nonrheumatic mitral (valve) insufficiency: Secondary | ICD-10-CM | POA: Diagnosis not present

## 2024-03-28 DIAGNOSIS — Z9079 Acquired absence of other genital organ(s): Secondary | ICD-10-CM | POA: Diagnosis not present

## 2024-03-28 DIAGNOSIS — Y732 Prosthetic and other implants, materials and accessory gastroenterology and urology devices associated with adverse incidents: Secondary | ICD-10-CM | POA: Diagnosis present

## 2024-03-28 DIAGNOSIS — N183 Chronic kidney disease, stage 3 unspecified: Secondary | ICD-10-CM

## 2024-03-28 DIAGNOSIS — Z905 Acquired absence of kidney: Secondary | ICD-10-CM | POA: Diagnosis not present

## 2024-03-28 DIAGNOSIS — E785 Hyperlipidemia, unspecified: Secondary | ICD-10-CM | POA: Diagnosis present

## 2024-03-28 DIAGNOSIS — Z794 Long term (current) use of insulin: Secondary | ICD-10-CM | POA: Diagnosis not present

## 2024-03-28 DIAGNOSIS — E871 Hypo-osmolality and hyponatremia: Secondary | ICD-10-CM | POA: Diagnosis present

## 2024-03-28 DIAGNOSIS — M5116 Intervertebral disc disorders with radiculopathy, lumbar region: Secondary | ICD-10-CM | POA: Diagnosis present

## 2024-03-28 DIAGNOSIS — Z7984 Long term (current) use of oral hypoglycemic drugs: Secondary | ICD-10-CM | POA: Diagnosis not present

## 2024-03-28 DIAGNOSIS — E1122 Type 2 diabetes mellitus with diabetic chronic kidney disease: Secondary | ICD-10-CM | POA: Diagnosis present

## 2024-03-28 DIAGNOSIS — I129 Hypertensive chronic kidney disease with stage 1 through stage 4 chronic kidney disease, or unspecified chronic kidney disease: Secondary | ICD-10-CM | POA: Diagnosis present

## 2024-03-28 DIAGNOSIS — I351 Nonrheumatic aortic (valve) insufficiency: Secondary | ICD-10-CM | POA: Diagnosis not present

## 2024-03-28 DIAGNOSIS — R7881 Bacteremia: Secondary | ICD-10-CM | POA: Diagnosis not present

## 2024-03-28 DIAGNOSIS — E119 Type 2 diabetes mellitus without complications: Secondary | ICD-10-CM | POA: Diagnosis not present

## 2024-03-28 DIAGNOSIS — Z936 Other artificial openings of urinary tract status: Secondary | ICD-10-CM | POA: Diagnosis not present

## 2024-03-28 DIAGNOSIS — N136 Pyonephrosis: Secondary | ICD-10-CM | POA: Diagnosis present

## 2024-03-28 DIAGNOSIS — Z888 Allergy status to other drugs, medicaments and biological substances status: Secondary | ICD-10-CM | POA: Diagnosis not present

## 2024-03-28 DIAGNOSIS — C675 Malignant neoplasm of bladder neck: Secondary | ICD-10-CM | POA: Diagnosis not present

## 2024-03-28 DIAGNOSIS — B952 Enterococcus as the cause of diseases classified elsewhere: Secondary | ICD-10-CM | POA: Diagnosis present

## 2024-03-28 DIAGNOSIS — I4891 Unspecified atrial fibrillation: Secondary | ICD-10-CM | POA: Diagnosis not present

## 2024-03-28 DIAGNOSIS — I480 Paroxysmal atrial fibrillation: Secondary | ICD-10-CM | POA: Diagnosis present

## 2024-03-28 DIAGNOSIS — Z7901 Long term (current) use of anticoagulants: Secondary | ICD-10-CM | POA: Diagnosis not present

## 2024-03-28 DIAGNOSIS — B9689 Other specified bacterial agents as the cause of diseases classified elsewhere: Secondary | ICD-10-CM | POA: Diagnosis not present

## 2024-03-28 DIAGNOSIS — D631 Anemia in chronic kidney disease: Secondary | ICD-10-CM | POA: Diagnosis present

## 2024-03-28 DIAGNOSIS — N1831 Chronic kidney disease, stage 3a: Secondary | ICD-10-CM | POA: Diagnosis present

## 2024-03-28 DIAGNOSIS — Z85828 Personal history of other malignant neoplasm of skin: Secondary | ICD-10-CM | POA: Diagnosis not present

## 2024-03-28 DIAGNOSIS — Z8616 Personal history of COVID-19: Secondary | ICD-10-CM | POA: Diagnosis not present

## 2024-03-28 DIAGNOSIS — M48061 Spinal stenosis, lumbar region without neurogenic claudication: Secondary | ICD-10-CM | POA: Diagnosis present

## 2024-03-28 LAB — BLOOD CULTURE ID PANEL (REFLEXED) - BCID2

## 2024-03-28 LAB — BASIC METABOLIC PANEL WITH GFR
Anion gap: 13 (ref 5–15)
BUN: 21 mg/dL (ref 8–23)
CO2: 18 mmol/L — ABNORMAL LOW (ref 22–32)
Calcium: 9 mg/dL (ref 8.9–10.3)
Chloride: 99 mmol/L (ref 98–111)
Creatinine, Ser: 1.24 mg/dL (ref 0.61–1.24)
GFR, Estimated: >60 mL/min (ref >60)
Glucose, Bld: 171 mg/dL — ABNORMAL HIGH (ref 70–99)
Potassium: 4.4 mmol/L (ref 3.5–5.1)
Sodium: 130 mmol/L — ABNORMAL LOW (ref 135–145)

## 2024-03-28 LAB — CBC
HCT: 30.8 % — ABNORMAL LOW (ref 39.0–52.0)
Hemoglobin: 10.2 g/dL — ABNORMAL LOW (ref 13.0–17.0)
MCH: 28 pg (ref 26.0–34.0)
MCHC: 33.1 g/dL (ref 30.0–36.0)
MCV: 84.6 fL (ref 80.0–100.0)
Platelets: 152 10*3/uL (ref 150–400)
RBC: 3.64 MIL/uL — ABNORMAL LOW (ref 4.22–5.81)
RDW: 14.1 % (ref 11.5–15.5)
WBC: 12 10*3/uL — ABNORMAL HIGH (ref 4.0–10.5)
nRBC: 0 % (ref 0.0–0.2)

## 2024-03-28 LAB — GLUCOSE, CAPILLARY
Glucose-Capillary: 154 mg/dL — ABNORMAL HIGH (ref 70–99)
Glucose-Capillary: 155 mg/dL — ABNORMAL HIGH (ref 70–99)
Glucose-Capillary: 181 mg/dL — ABNORMAL HIGH (ref 70–99)

## 2024-03-28 LAB — CBG MONITORING, ED
Glucose-Capillary: 190 mg/dL — ABNORMAL HIGH (ref 70–99)
Glucose-Capillary: 205 mg/dL — ABNORMAL HIGH (ref 70–99)
Glucose-Capillary: 204 mg/dL — ABNORMAL HIGH (ref 70–99)
Glucose-Capillary: 179 mg/dL — ABNORMAL HIGH (ref 70–99)

## 2024-03-28 LAB — OSMOLALITY: Osmolality: 285 mosm/kg (ref 275–295)

## 2024-03-28 MED ORDER — INSULIN ASPART 100 UNIT/ML IJ SOLN
0.0000 [IU] | INTRAMUSCULAR | Status: DC
  Administered 2024-03-28 (×4): 2 [IU] via SUBCUTANEOUS
  Administered 2024-03-28: 3 [IU] via SUBCUTANEOUS
  Administered 2024-03-29: 2 [IU] via SUBCUTANEOUS
  Administered 2024-03-29: 5 [IU] via SUBCUTANEOUS
  Administered 2024-03-29: 1 [IU] via SUBCUTANEOUS
  Administered 2024-03-29: 2 [IU] via SUBCUTANEOUS
  Filled 2024-03-28: qty 0.09

## 2024-03-28 MED ORDER — NALOXONE HCL 0.4 MG/ML IJ SOLN: 0.4000 mg | INTRAMUSCULAR | Status: DC | PRN

## 2024-03-28 MED ORDER — GENTAMICIN SULFATE 40 MG/ML IJ SOLN
5.0000 mg/kg | Freq: Once | INTRAVENOUS | Status: AC
  Administered 2024-03-28: 450 mg via INTRAVENOUS
  Filled 2024-03-28: qty 11.25

## 2024-03-28 MED ORDER — HYDROMORPHONE HCL 1 MG/ML IJ SOLN
1.0000 mg | INTRAMUSCULAR | Status: DC | PRN
  Administered 2024-03-28 – 2024-04-02 (×19): 1 mg via INTRAVENOUS
  Filled 2024-03-28 (×21): qty 1

## 2024-03-28 MED ORDER — METOPROLOL SUCCINATE ER 25 MG PO TB24
25.0000 mg | ORAL_TABLET | Freq: Every day | ORAL | Status: DC
  Filled 2024-03-28 (×2): qty 1

## 2024-03-28 MED ORDER — PREGABALIN 75 MG PO CAPS
75.0000 mg | ORAL_CAPSULE | Freq: Every day | ORAL | Status: DC
  Administered 2024-03-28 – 2024-04-03 (×6): 75 mg via ORAL
  Filled 2024-03-28 (×6): qty 1

## 2024-03-28 MED ORDER — SODIUM CHLORIDE 0.9 % IV SOLN: INTRAVENOUS | Status: DC

## 2024-03-28 MED ORDER — SODIUM CHLORIDE 0.9 % IV SOLN
2.0000 g | INTRAVENOUS | Status: DC
  Filled 2024-03-28 (×3): qty 2000

## 2024-03-28 MED ORDER — SODIUM CHLORIDE 0.9 % IV SOLN
2.0000 g | INTRAVENOUS | Status: DC
  Administered 2024-03-28 – 2024-04-03 (×33): 2 g via INTRAVENOUS
  Filled 2024-03-28 (×39): qty 2000

## 2024-03-28 MED ORDER — APIXABAN 5 MG PO TABS
5.0000 mg | ORAL_TABLET | Freq: Two times a day (BID) | ORAL | Status: DC
  Administered 2024-03-28 – 2024-04-03 (×11): 5 mg via ORAL
  Filled 2024-03-28 (×11): qty 1

## 2024-03-28 MED ORDER — ACETAMINOPHEN 325 MG PO TABS
650.0000 mg | ORAL_TABLET | Freq: Four times a day (QID) | ORAL | Status: DC | PRN
Start: 2024-03-28 — End: 2024-04-03
  Administered 2024-03-29: 650 mg via ORAL
  Filled 2024-03-28 (×2): qty 2

## 2024-03-28 MED ORDER — SODIUM CHLORIDE 0.9 % IV SOLN
2.0000 g | Freq: Three times a day (TID) | INTRAVENOUS | Status: DC
  Administered 2024-03-28: 2 g via INTRAVENOUS
  Filled 2024-03-28: qty 12.5

## 2024-03-28 MED ORDER — OXYCODONE HCL 5 MG PO TABS
5.0000 mg | ORAL_TABLET | Freq: Four times a day (QID) | ORAL | Status: DC | PRN
  Administered 2024-03-28 – 2024-03-29 (×4): 5 mg via ORAL
  Filled 2024-03-28 (×4): qty 1

## 2024-03-28 MED ORDER — ATORVASTATIN CALCIUM 40 MG PO TABS
40.0000 mg | ORAL_TABLET | Freq: Every day | ORAL | Status: DC
  Administered 2024-03-28 – 2024-04-02 (×6): 40 mg via ORAL
  Filled 2024-03-28 (×6): qty 1

## 2024-03-28 MED ORDER — ENOXAPARIN SODIUM 40 MG/0.4ML IJ SOSY: 40.0000 mg | PREFILLED_SYRINGE | INTRAMUSCULAR | Status: DC

## 2024-03-28 MED ORDER — SODIUM CHLORIDE 0.9 % IV SOLN
2.0000 g | Freq: Two times a day (BID) | INTRAVENOUS | Status: DC
  Administered 2024-03-28 – 2024-04-02 (×11): 2 g via INTRAVENOUS
  Filled 2024-03-28 (×11): qty 20

## 2024-03-28 MED ORDER — ACETAMINOPHEN 650 MG RE SUPP: 650.0000 mg | Freq: Four times a day (QID) | RECTAL | Status: DC | PRN

## 2024-03-28 NOTE — H&P (Signed)
 History and Physical    Robert [name] ZOX:[id number] DOB: 10/05/1951 DOA: 03/27/2024  PCP: Genia Kettering, MD  Patient coming from: Home  Chief Complaint: Low back pain  HPI: Robert [name] is a 73 y.o. male with medical history paroxysmal A-fib on Eliquis , hypertension, hyperlipidemia, type 2 diabetes, metastatic bladder cancer status post cystoprostatectomy, total ureterectomy and right nephroureterectomy and solitary left kidney status post ureterostomy, currently on immunotherapy for bladder cancer.  Patient was recently admitted 5/9-5/10 for AKI in the setting of solitary left kidney with mild to moderate hydronephrosis with stent retracted to the mid ureter on imaging.  Patient underwent stent exchange by urology.  Creatinine had peaked to 6.0 on admission and improved to 2.9 after stent exchange (baseline creatinine 1.4-1.6).  Patient presents to the ED today with a chief complaint of severe low back pain.  Patient reports history of chronic low back pain but it has been much worse for the past 2 days and radiating down his right leg.  No falls or injuries reported.  No lower extremity weakness or loss of sensation.  No fevers or chills.  No nausea, vomiting, or abdominal pain.  No cough, shortness breath, or chest pain.  Last dose of Eliquis  was 6/3 in the evening (over 24 hours ago).  ED Course: Vital signs stable upon arrival.  Labs notable for WBC count 13.4, hemoglobin 10.6 (at baseline), sodium 130, bicarb 20, glucose 172, BUN 26, creatinine 1.5 (stable).  UA with signs of infection (positive nitrite, moderate leukocytes, and microscopy showing 21-50 WBCs).  Urine culture in process.  Blood cultures in process.  CT renal stone study showing left ureteral stent coiled in the proximal left ureter with mild to moderate left-sided hydronephrosis. ED physician discussed the case with on-call urologist who did not feel that the patient required acute urologic intervention  at this time.    MRI of lumbar spine showing: "1. At L3-L4, right subarticular disc protrusion results in moderate to severe right subarticular recess stenosis with potential impingement of descending right L4 nerve root. Mild to moderate canal stenosis and mild right foraminal stenosis. 2. At L4-L5, right subarticular disc protrusion results in moderate right subarticular recess stenosis with potential impingement of descending right L5 nerve roots. Mild canal stenosis. Moderate right and mild-to-moderate left foraminal stenosis. 3. At L5-S1, moderate left foraminal stenosis."  Lumbar MRI results were discussed with on-call provider for neurosurgery who recommended steroids and pain control.  Recommended consulting IR in the morning for epidural steroid injection.  Case was also discussed with Dr. Julietta Ogren with IR regarding need for epidural steroid injection in the morning.  IR requested to be reconsulted in the morning after patient is admitted.    Patient was given IV Decadron  8 mg, Dilaudid , morphine , ceftriaxone , and 500 mL IV fluids.  Review of Systems:  Review of Systems  All other systems reviewed and are negative.   Past Medical History:  Diagnosis Date   Anticoagulant long-term use    eliquis --- managed by cardiology   Arthritis    Bladder cancer [facility])    urologist--- dr Derrick Fling;  overlapping   Cataract    History of basal cell carcinoma (BCC) excision    History of COVID-19 05/2019   per pt mild symptoms that resolved   HLD (hyperlipidemia)    HTN (hypertension)    Hx of colonic polyps    Insulin  dependent type 2 diabetes mellitus [facility])    endocrinologist--- dr Aldona Amel --  (03-11-2022  per pt check blood sugar 1-2 times daily,  fasting sugar-- 118-120s)   Nocturia    Paroxysmal atrial fibrillation (HCC) 03/12/2019   cardiologist--- dr Floria Hurst   Renal disorder    Ureterocele    Use of cane as ambulatory aid     Past Surgical History:  Procedure Laterality Date    BLEPHAROPLASTY Bilateral    per pt approx  2001;   upper eyelid's   COLONOSCOPY     last one approx 2023   CYSTOSCOPY W/ RETROGRADES N/A 12/08/2023   Procedure: CYSTOSCOPY;  Surgeon: Melody Spurling., MD;  Location: [facility];  Service: Urology;  Laterality: N/A;  390 MINUTES NEEDED FOR CASE   CYSTOSCOPY WITH URETEROSCOPY AND STENT PLACEMENT Right 12/02/2022   Procedure: CYSTOSCOPY WITH RIGHT URETEROSCOPY, RIGHT DIAGNOSTIC URETEROSCOPY;  Surgeon: Christina Coyer, MD;  Location: [facility];  Service: Urology;  Laterality: Right;  90 MINS FOR CASE   CYSTOSCOPY/RETROGRADE/URETEROSCOPY Bilateral 05/05/2023   Procedure: CYSTOSCOPY BILATERAL RETROGRADE PYELOGRAM RIGHT URETEROSCOPY RIGHT STENT PLACEMENT;  Surgeon: Christina Coyer, MD;  Location: [facility];  Service: Urology;  Laterality: Bilateral;  90 MINS FOR CASE   IR IMAGING GUIDED PORT INSERTION  05/30/2023   PATELLAR TENDON REPAIR Left 04/16/2004   @[facility]    ROBOT ASSITED LAPAROSCOPIC NEPHROURETERECTOMY Right 12/08/2023   Procedure: RIGHT ROBOT ASSISTED Nephroureterectomy;  Surgeon: Melody Spurling., MD;  Location: [facility];  Service: Urology;  Laterality: Right;   ROBOT LAP RADICAL CYSTOPROSTATECTOMY PELVIC LYMPHADENECTOMY, NEOBLADDER  12/08/2023   Procedure: ROBOT ASSISTED LAPAROSCOPIC RADICAL CYSTOPROSTATECTOMY;  Surgeon: Melody Spurling., MD;  Location: [facility];  Service: Urology;;   TOTAL URETHRECTOMY,RADICAL N/A 12/08/2023   Procedure: TOTAL URETHRECTOMY,RADICAL;  Surgeon: Melody Spurling., MD;  Location: [facility];  Service: Urology;  Laterality: N/A;   TRANSURETHRAL RESECTION OF BLADDER TUMOR N/A 06/18/2021   Procedure: TRANSURETHRAL RESECTION OF BLADDER TUMOR (TURBT) WITH RIGHT URETERAL STENT PLACEMENT, RIGHT URETEROSCOPY WITH DISTRACTION OF TUMOR, FULGURATION;  Surgeon: Christina Coyer, MD;  Location: [facility];  Service: Urology;  Laterality: N/A;   TRANSURETHRAL RESECTION OF BLADDER TUMOR N/A  03/15/2022   Procedure: TRANSURETHRAL RESECTION OF BLADDER TUMOR (TURBT) WITH BILATERAL URETEROSCOPY/URETHRAL/ DILATION/BILATERAL RETROGRADE PYELOGRAM/BIOPSY AND FULGURATION OF RIGHT URETERAL CANCER/RIGHT STENT PLACEMENT;  Surgeon: Christina Coyer, MD;  Location: [facility];  Service: Urology;  Laterality: N/A;   TRANSURETHRAL RESECTION OF BLADDER TUMOR N/A 05/05/2023   Procedure: TRANSURETHRAL RESECTION OF BLADDER TUMOR (TURBT) BLADDER BIOSPY;  Surgeon: Christina Coyer, MD;  Location: [facility];  Service: Urology;  Laterality: N/A;   TRANSURETHRAL RESECTION OF BLADDER TUMOR WITH MITOMYCIN -C N/A 12/02/2022   Procedure: TRANSURETHRAL RESECTION OF BLADDER TUMOR;  Surgeon: Christina Coyer, MD;  Location: [facility];  Service: Urology;  Laterality: N/A;   TRANSURETHRAL RESECTION OF PROSTATE  05/05/2023   Procedure: TRANSURETHRAL RESECTION OF THE PROSTATE (TURP);  Surgeon: Christina Coyer, MD;  Location: [facility];  Service: Urology;;     reports that he has been smoking cigars and cigarettes. He started smoking about 13 years ago. He has never used smokeless tobacco. He reports current alcohol  use of about 3.0 standard drinks of alcohol  per week. He reports that he does not use drugs.  Allergies  Allergen Reactions   Lisinopril  Swelling and Other (See Comments)    Tongue and throat became swollen    Family History  Problem Relation Age of Onset   Pancreatic cancer Mother 79   Cancer Paternal Uncle  unknown type cancer   Aortic aneurysm Other        family history   Cancer Other        Breast (1st degree relative)   Colon cancer Neg Hx    Rectal cancer Neg Hx    Stomach cancer Neg Hx    Esophageal cancer Neg Hx     Prior to Admission medications   Medication Sig Start Date End Date Taking? Authorizing Provider  apixaban  (ELIQUIS ) 5 MG TABS tablet Take 1 tablet (5 mg total) by mouth 2 (two) times daily. 12/21/23  Yes Nahser, Lela Purple, MD  atorvastatin  (LIPITOR) 40 MG tablet Take 1 tablet (40 mg total) by mouth at bedtime. 02/07/24  Yes Audria Leather, MD  B Complex-C (B-COMPLEX WITH VITAMIN C) tablet Take 1 tablet by mouth 3 (three) times a week.   Yes [provider]  Cholecalciferol (VITAMIN D-3 PO) Take 1 tablet by mouth 3 (three) times a week.   Yes [provider]  EPINEPHrine  0.3 mg/0.3 mL IJ SOAJ injection Inject 0.3 mg into the muscle as needed for anaphylaxis. 09/14/23  Yes [name], PA-C  metoprolol  succinate (TOPROL -XL) 25 MG 24 hr tablet Take 1 tablet (25 mg total) by mouth daily. 09/19/23  Yes Nahser, Lela Purple, MD  propranolol  (INDERAL ) 10 MG tablet TAKE 1 TABLET BY MOUTH 4 TIMES DAILY AS NEEDED FOR PALPITATIONS, IRREGULAR OR FAST HEART RATE 10/25/22  Yes Nahser, Lela Purple, MD  Semaglutide , 1 MG/DOSE, (OZEMPIC , 1 MG/DOSE,) 4 MG/3ML SOPN INJECT 1 MG UNDER THE SKIN ONCE A WEEK AS DIRECTED Patient taking differently: Inject 1 mg into the skin every Saturday. 07/20/23  Yes Emilie Harden, MD  amLODipine  (NORVASC ) 5 MG tablet Take 1 tablet (5 mg total) by mouth daily. Patient not taking: Reported on 03/26/2024 09/13/23   Sueellen Emery, MD  ciprofloxacin  (CIPRO ) 500 MG tablet Take 1 tablet (500 mg total) by mouth 2 (two) times daily for 10 days. Patient not taking: Reported on 03/21/2024 03/19/24 03/29/24  Plotnikov, Oakley Bellman, MD  glucose blood test strip 1 each by Other route daily. Use daily for glucose control - Dx 250.00 04/21/20   [name], [name], MD    Physical Exam: Vitals:   03/27/24 1638 03/27/24 1643 03/27/24 2000 03/28/24 0000  BP:  135/75 126/64 137/74  Pulse:  82 81 83  Resp: 16 16 17 16   Temp:  98.3 F (36.8 C) 98.1 F (36.7 C) 98.4 F (36.9 C)  TempSrc:  Oral Oral Oral  SpO2:  99% 99% 98%  Weight:      Height:        Physical Exam Vitals reviewed.  Constitutional:      General: He is not in acute distress. HENT:     Head: Normocephalic and atraumatic.   Eyes:     Extraocular Movements: Extraocular movements intact.  Cardiovascular:     Rate and Rhythm: Normal rate and regular rhythm.     Pulses: Normal pulses.  Pulmonary:     Effort: Pulmonary effort is normal. No respiratory distress.     Breath sounds: Normal breath sounds. No wheezing or rales.  Abdominal:     General: Bowel sounds are normal. There is no distension.     Palpations: Abdomen is soft.     Tenderness: There is no abdominal tenderness. There is no guarding.  Musculoskeletal:     Cervical back: Normal range of motion.     Right lower leg: No edema.  Left lower leg: No edema.  Skin:    General: Skin is warm and dry.  Neurological:     General: No focal deficit present.     Mental Status: He is alert and oriented to person, place, and time.     Comments: Strength 5 out of 5 in bilateral lower extremities and sensation intact.     Labs on Admission: I have personally reviewed following labs and imaging studies  CBC: Recent Labs  Lab 03/27/24 1444  WBC 13.4*  NEUTROABS 10.4*  HGB 10.6*  HCT 31.2*  MCV 83.6  PLT 171   Basic Metabolic Panel: Recent Labs  Lab 03/27/24 1444  NA 130*  K 4.1  CL 98  CO2 20*  GLUCOSE 172*  BUN 26*  CREATININE 1.54*  CALCIUM  9.1   GFR: Estimated Creatinine Clearance: 51.1 mL/min (A) (by C-G formula based on SCr of 1.54 mg/dL (H)). Liver Function Tests: Recent Labs  Lab 03/27/24 1444  AST 19  ALT 13  ALKPHOS 81  BILITOT 0.9  PROT 7.8  ALBUMIN 3.5   No results for input(s): "LIPASE", "AMYLASE" in the last 168 hours. No results for input(s): "AMMONIA" in the last 168 hours. Coagulation Profile: No results for input(s): "INR", "PROTIME" in the last 168 hours. Cardiac Enzymes: No results for input(s): "CKTOTAL", "CKMB", "CKMBINDEX", "TROPONINI" in the last 168 hours. BNP (last 3 results) No results for input(s): "PROBNP" in the last 8760 hours. HbA1C: No results for input(s): "HGBA1C" in the last 72  hours. CBG: No results for input(s): "GLUCAP" in the last 168 hours. Lipid Profile: No results for input(s): "CHOL", "HDL", "LDLCALC", "TRIG", "CHOLHDL", "LDLDIRECT" in the last 72 hours. Thyroid  Function Tests: No results for input(s): "TSH", "T4TOTAL", "FREET4", "T3FREE", "THYROIDAB" in the last 72 hours. Anemia Panel: No results for input(s): "VITAMINB12", "FOLATE", "FERRITIN", "TIBC", "IRON", "RETICCTPCT" in the last 72 hours. Urine analysis:    Component Value Date/Time   COLORURINE YELLOW 03/27/2024 1406   APPEARANCEUR HAZY (A) 03/27/2024 1406   LABSPEC 1.017 03/27/2024 1406   PHURINE 5.0 03/27/2024 1406   GLUCOSEU 150 (A) 03/27/2024 1406   GLUCOSEU 100 (A) 02/27/2023 1547   HGBUR SMALL (A) 03/27/2024 1406   HGBUR negative 12/07/2009 0811   BILIRUBINUR NEGATIVE 03/27/2024 1406   BILIRUBINUR neg 04/29/2021 1443   KETONESUR NEGATIVE 03/27/2024 1406   PROTEINUR 100 (A) 03/27/2024 1406   UROBILINOGEN 0.2 02/27/2023 1547   NITRITE POSITIVE (A) 03/27/2024 1406   LEUKOCYTESUR MODERATE (A) 03/27/2024 1406    Radiological Exams on Admission: MR Lumbar Spine W Wo Contrast Result Date: 03/27/2024 CLINICAL DATA:  Low back pain, cancer suspected Low back pain, cauda equina syndrome suspected. EXAM: MRI LUMBAR SPINE WITHOUT AND WITH CONTRAST TECHNIQUE: Multiplanar and multiecho pulse sequences of the lumbar spine were obtained without and with intravenous contrast. CONTRAST:  9mL GADAVIST  GADOBUTROL  1 MMOL/ML IV SOLN COMPARISON:  None Available. FINDINGS: Segmentation:  Standard. Alignment: No substantial sagittal subluxation. Vertebrae: Right eccentric degenerative/discogenic endplate signal change at L4-L5. No specific evidence of acute fracture or discitis/osteomyelitis. No suspicious bone lesion. Conus medullaris and cauda equina: Conus extends to the L1-L2 level. Conus and cauda equina appear normal. Paraspinal and other soft tissues: Mild left hydronephrosis. Disc levels: T12-L1: No  significant disc protrusion, foraminal stenosis, or canal stenosis. L1-L2: Left eccentric disc protrusion results in mild left foraminal stenosis. Patent canal and right foramen. L2-L3: Mild disc bulging. Mild facet arthropathy. No significant canal or foraminal stenosis L3-L4: Broad disc bulge with superimposed right  subarticular disc protrusion which extends inferiorly. Bilateral facet arthropathy. Resulting moderate to severe right subarticular recess stenosis with potential impingement of descending right L4 nerve root. Mild to moderate canal stenosis. Mild right foraminal stenosis. L4-L5: Broad disc bulge with right subarticular disc protrusion. Bilateral facet arthropathy. Resulting moderate right subarticular recess stenosis with potential impingement of the descending right L5 nerve roots. Mild canal stenosis. Moderate right and mild-to-moderate left foraminal stenosis. L5-S1: Right eccentric disc bulge and bilateral facet arthropathy. Superiorly directed right subarticular disc protrusion. Patent canal. Moderate left foraminal stenosis. IMPRESSION: 1. At L3-L4, right subarticular disc protrusion results in moderate to severe right subarticular recess stenosis with potential impingement of descending right L4 nerve root. Mild to moderate canal stenosis and mild right foraminal stenosis. 2. At L4-L5, right subarticular disc protrusion results in moderate right subarticular recess stenosis with potential impingement of descending right L5 nerve roots. Mild canal stenosis. Moderate right and mild-to-moderate left foraminal stenosis. 3. At L5-S1, moderate left foraminal stenosis. 4. Mild left hydronephrosis, better characterized on recent CT Electronically Signed   By: Stevenson Elbe M.D.   On: 03/27/2024 21:38   CT Renal Stone Study Result Date: 03/27/2024 CLINICAL DATA:  Abdominal and flank pain. EXAM: CT ABDOMEN AND PELVIS WITHOUT CONTRAST TECHNIQUE: Multidetector CT imaging of the abdomen and pelvis was  performed following the standard protocol without IV contrast. RADIATION DOSE REDUCTION: This exam was performed according to the departmental dose-optimization program which includes automated exposure control, adjustment of the mA and/or kV according to patient size and/or use of iterative reconstruction technique. COMPARISON:  CT abdomen and pelvis 03/01/2024. FINDINGS: Lower chest: No acute abnormality. Hepatobiliary: No focal liver abnormality is seen. No gallstones, gallbladder wall thickening, or biliary dilatation. Pancreas: Unremarkable. No pancreatic ductal dilatation or surrounding inflammatory changes. Spleen: Normal in size without focal abnormality. Adrenals/Urinary Tract: Patient is status post right nephrectomy and cystectomy. Left-sided urostomy present. A left ureteral stent is present. The proximal portion of the stent is coiled in the proximal left ureter. There is mild to moderate left-sided hydronephrosis with contrast seen in the left renal collecting system. There is mild left perinephric fat stranding. No contrast is seen distal to this point. The adrenal glands are within normal limits. Stomach/Bowel: Stomach is within normal limits. Appendix appears normal. No evidence of bowel wall thickening, distention, or inflammatory changes. Colonic diverticulosis is present. Vascular/Lymphatic: Aortic atherosclerosis. No enlarged abdominal or pelvic lymph nodes. Reproductive: Prostate gland is surgically absent. Other: There is a small amount of free fluid in the pelvis similar to the prior study. There is no focal abdominal wall hernia. Musculoskeletal: Degenerative changes affect the spine. There severe degenerative changes of the right hip. IMPRESSION: 1. Left ureteral stent is coiled in the proximal left ureter. There is mild to moderate left-sided hydronephrosis with contrast seen in the left renal collecting system only. Obstructive process not excluded secondary to stent positioning.  Recommend clinical correlation and follow-up. 2. Left-sided urostomy. 3. Status post right nephrectomy and cystectomy. 4. Stable small amount of free fluid in the pelvis. 5. Colonic diverticulosis. 6. Aortic atherosclerosis. Aortic Atherosclerosis (ICD10-I70.0). Electronically Signed   By: Tyron Gallon M.D.   On: 03/27/2024 19:09    Assessment and Plan  Severe low back pain secondary to lumbar radiculopathy No lower extremity weakness or sensory loss.  ED physician discussed the case and lumbar MRI results with on-call provider for neurosurgery who recommended consulting IR in the morning for epidural steroid injection.  Patient was given IV  Decadron  8 mg in the ED.  Continue pain management.  Last dose of Eliquis  was 6/3 in the evening (over 24 hours ago).  Continue to hold Eliquis  and consult IR in the morning.  Keep n.p.o. after midnight.  Complicated UTI Mild leukocytosis on labs.  Vital signs stable, no signs of sepsis.  UA with positive nitrite, moderate leukocytes, and microscopy showing 21-50 WBCs.  CT showing left ureteral stent coiled in the proximal left ureter with mild to moderate left-sided hydronephrosis. ED physician discussed the case with on-call urologist who did not feel that the patient required acute urologic intervention at this time.  Previous urine culture from 2 months ago grew Serratia marcescens resistant to ceftriaxone .  Continue antibiotic coverage with cefepime .  Follow-up urine and blood cultures.  Trend WBC count.  Mild hyponatremia Patient received IV fluids in the ED.  Continue to monitor sodium level.  Check serum osmolarity.  CKD stage IIIa Stable, creatinine at baseline.  Continue to monitor renal function.  Paroxysmal A-fib Hold Eliquis  until patient is evaluated by IR in the morning.  Continue metoprolol .  Hypertension Stable, continue metoprolol .  Hyperlipidemia Continue Lipitor.  Type 2 diabetes Patient is on semaglutide  and last A1c 7.1 on  02/13/2024.  Placed on sensitive sliding scale insulin  every 4 hours for now as patient is NPO.  Chronic anemia Hemoglobin at baseline, monitor labs.  Metastatic bladder cancer Status post cystoprostatectomy, total ureterectomy and right nephroureterectomy and solitary left kidney status post ureterostomy. Currently on treatment with immunotherapy.  Outpatient oncology and urology follow-up.  DVT prophylaxis: SCDs Code Status: Full Code (discussed with the patient) Level of care: Telemetry bed Admission status: It is my clinical opinion that referral for OBSERVATION is reasonable and necessary in this patient based on the above information provided. The aforementioned taken together are felt to place the patient at high risk for further clinical deterioration. However, it is anticipated that the patient may be medically stable for discharge from the hospital within 24 to 48 hours.  Juliette Oh MD [facility]  If 7PM-7AM, please contact night-coverage [email or web address]  03/28/2024, 2:01 AM

## 2024-03-28 NOTE — Progress Notes (Signed)
 PROGRESS NOTE    Robert [name]  WUJ:[id number] DOB: 1951-04-14 DOA: 03/27/2024 PCP: Genia Kettering, MD   Chief Complaint  Patient presents with   Back Pain    Brief Narrative:  Patient is a 73 year old gentleman with history of paroxysmal A-fib on Eliquis , hypertension, hyperlipidemia, type 2 diabetes, metastatic bladder cancer status post cystoprostatectomy, total neurectomy and right nephroureterectomy and solitary left kidney status post ureterostomy currently on immunotherapy for bladder cancer recently admitted for AKI from 03/01/2024 to 03/02/2024 with mild to moderate hydronephrosis with stent retracted to the mid ureter on imaging whereby patient underwent stent exchange per urology with improvement with renal function. Patient presenting to the ED with complaint of severe low back pain with radiation to the right lower extremity causing difficulty ambulating and standing.  Patient also noted to have a complicated UTI placed on IV antibiotics and pancultured.  Blood cultures positive for Enterococcus faecalis.  ID consulted.  IR also consulted for evaluation for epidural steroid injection however recommended outpatient referral.   Assessment & Plan:   Principal Problem:   Lumbar radiculopathy Active Problems:   Bacteremia   Bladder cancer (HCC)   Complicated UTI (urinary tract infection)   Paroxysmal A-fib (HCC)   Hyponatremia   CKD (chronic kidney disease), stage III (HCC)  #1 severe low back pain secondary to lumbar radiculopathy -Patient denied any bowel or urinary incontinence, no lower extremity weakness or sensory loss. - It is noted that ED physician discussed lumbar MRI results with on-call provider for neurosurgery who recommended consulting IR for epidural steroid injection. - Patient received IV Decadron  8 mg in the ED. - Eliquis  held. - IR consulted this morning for epidural steroid injection and recommended outpatient referral which has been placed  per IR. - Resume home regimen Eliquis . - Place on Lyrica. - Continue current pain regimen.  2.  Complicated UTI -Urinalysis concerning for UTI. -Urine cultures with > 100,000 colonies of Enterococcus faecalis with sensitivities pending. -CT showed left ureteral stent called in the proximal left ureter with mild to moderate left-sided hydronephrosis.  Patient seen in consultation by urology who feel no acute urological intervention is indicated at this time however if patient becomes anuric, develops left flank pain or becomes febrile urology will need to be paged for consideration of stent exchange. -Will need outpatient follow-up with primary urologist. - Patient on IV Rocephin , started on IV ampicillin due to bacteremia per ID.Robert [name]  3.  Enterococcus faecalis bacteremia -Likely seeded from the urine. - Patient started on IV ampicillin. - Patient seen in consultation by ID who are recommending discontinuation of IV cefepime  and placing patient on IV ampicillin and IV Rocephin . - ID recommending 2D echo and will need port pulled out after oncology notified. - Dose of IV gentamicin  ordered per ID due to history of Serratia.  4.  Mild hyponatremia -IV fluids.  5.  CKD stage IIIa -Stable.  6.  Paroxysmal A-fib -Continue home regimen metoprolol  for rate control. - Resume Eliquis  as per IR no procedure planned at this time.  7.  Hypertension -Continue metoprolol .  8.  Hyperlipidemia -Lipitor.  9.  Type 2 diabetes mellitus -Hemoglobin A1c 7.1 on 02/13/2024. - SSI.  10.  Chronic anemia -Hemoglobin currently stable.  11.  Metastatic bladder cancer -Status post cystoprostatectomy, total ureterectomy and right nephro ureterectomy and solitary left kidney status post ureterostomy. - Currently on treatment with immunotherapy. - Will inform oncologist via epic of admission. - Due to bacteremia, ID recommending port removal.  DVT prophylaxis: Eliquis  Code Status: Full Family  Communication: Updated patient.  No family at bedside. Disposition: TBD  Status is: Inpatient The patient will require care spanning > 2 midnights and should be moved to inpatient because: Severity of illness   Consultants:  Urology: Dr. Freddi Jaeger 03/28/2024 ID Dr. Zelda Hickman 03/28/2024  Procedures:  CT renal stone protocol 03/27/2024   Antimicrobials:  Anti-infectives (From admission, onward)    Start     Dose/Rate Route Frequency Ordered Stop   03/28/24 1400  cefTRIAXone  (ROCEPHIN ) 2 g in sodium chloride  0.9 % 100 mL IVPB        2 g 200 mL/hr over 30 Minutes Intravenous Every 12 hours 03/28/24 1356     03/28/24 1400  gentamicin  (GARAMYCIN ) 450 mg in dextrose  5 % 50 mL IVPB        5 mg/kg  90.7 kg 122.5 mL/hr over 30 Minutes Intravenous  Once 03/28/24 1356 03/28/24 1610   03/28/24 1200  ampicillin (OMNIPEN) 2 g in sodium chloride  0.9 % 100 mL IVPB        2 g 300 mL/hr over 20 Minutes Intravenous Every 4 hours 03/28/24 1015     03/28/24 1000  ampicillin (OMNIPEN) 2 g in sodium chloride  0.9 % 100 mL IVPB  Status:  Discontinued        2 g 300 mL/hr over 20 Minutes Intravenous Every 4 hours 03/28/24 0952 03/28/24 1015   03/28/24 0600  ceFEPIme  (MAXIPIME ) 2 g in sodium chloride  0.9 % 100 mL IVPB  Status:  Discontinued        2 g 200 mL/hr over 30 Minutes Intravenous Every 8 hours 03/28/24 0330 03/28/24 1356   03/27/24 1715  cefTRIAXone  (ROCEPHIN ) 1 g in sodium chloride  0.9 % 100 mL IVPB        1 g 200 mL/hr over 30 Minutes Intravenous  Once 03/27/24 1701 03/27/24 1936         Subjective: Laying on gurney in the ED.  Still with complaints of lower back pain radiating to the right lower extremity however somewhat improved than on admission.  Patient feels unable to stand or ambulate.  Denies any chest pain or shortness of breath.  No abdominal pain.  Objective: Vitals:   03/28/24 1055 03/28/24 1100 03/28/24 1200 03/28/24 1416  BP: (!) 117/97 128/72 (!) 133/54 (!) 112/98  Pulse: 82 75 81 87   Resp: 16 16    Temp: 98.1 F (36.7 C)   97.7 F (36.5 C)  TempSrc: Oral     SpO2: 98% 99% 93% 98%  Weight:      Height:        Intake/Output Summary (Last 24 hours) at 03/28/2024 1618 Last data filed at 03/28/2024 0700 Gross per 24 hour  Intake 700.08 ml  Output --  Net 700.08 ml   Filed Weights   03/27/24 1327 03/27/24 1330  Weight: 90.7 kg 90.7 kg    Examination:  General exam: Appears calm and comfortable  Respiratory system: Clear to auscultation.  No wheezes, no crackles, no rhonchi.  Fair air movement.  Speaking in full sentences.  Respiratory effort normal. Cardiovascular system: S1 & S2 heard, RRR. No JVD, murmurs, rubs, gallops or clicks. No pedal edema. Gastrointestinal system: Abdomen is nondistended, soft and nontender. No organomegaly or masses felt. Normal bowel sounds heard.  Urostomy in place with clear yellow urine. Central nervous system: Alert and oriented. No focal neurological deficits. Extremities: Symmetric 5 x 5 power. Skin: No rashes, lesions or ulcers  Psychiatry: Judgement and insight appear normal. Mood & affect appropriate.     Data Reviewed: I have personally reviewed following labs and imaging studies  CBC: Recent Labs  Lab 03/27/24 1444 03/28/24 0320  WBC 13.4* 12.0*  NEUTROABS 10.4*  --   HGB 10.6* 10.2*  HCT 31.2* 30.8*  MCV 83.6 84.6  PLT 171 152    Basic Metabolic Panel: Recent Labs  Lab 03/27/24 1444 03/28/24 0320  NA 130* 130*  K 4.1 4.4  CL 98 99  CO2 20* 18*  GLUCOSE 172* 171*  BUN 26* 21  CREATININE 1.54* 1.24  CALCIUM  9.1 9.0    GFR: Estimated Creatinine Clearance: 63.4 mL/min (by C-G formula based on SCr of 1.24 mg/dL).  Liver Function Tests: Recent Labs  Lab 03/27/24 1444  AST 19  ALT 13  ALKPHOS 81  BILITOT 0.9  PROT 7.8  ALBUMIN 3.5    CBG: Recent Labs  Lab 03/28/24 0334 03/28/24 0825 03/28/24 1206 03/28/24 1321  GLUCAP 190* 205* 204* 179*     Recent Results (from the past 240 hours)   Urine Culture (for pregnant, neutropenic or urologic patients or patients with an indwelling urinary catheter)     Status: Abnormal (Preliminary result)   Collection Time: 03/27/24  2:06 PM   Specimen: Urine, Clean Catch  Result Value Ref Range Status   Specimen Description   Final    URINE, CLEAN CATCH Performed at [facility], [address], [place], [place] [address]    Special Requests   Final    NONE Performed at [facility], [address], [place], [place] [address]    Culture (A)  Final    >=100,000 COLONIES/mL ENTEROCOCCUS FAECALIS CULTURE REINCUBATED FOR BETTER GROWTH Performed at [facility], [address] [place], [place] [address]    Report Status PENDING  Incomplete  Blood culture (routine x 2)     Status: None (Preliminary result)   Collection Time: 03/27/24  2:44 PM   Specimen: BLOOD LEFT ARM  Result Value Ref Range Status   Specimen Description   Final    BLOOD LEFT ARM Performed at [facility], [address], [place], [place] [address]    Special Requests   Final    BOTTLES DRAWN AEROBIC AND ANAEROBIC Blood Culture adequate volume Performed at [facility], [address] [place], [place] [address]    Culture  Setup Time   Final    GRAM POSITIVE COCCI IN CHAINS AEROBIC BOTTLE ONLY CRITICAL RESULT CALLED TO, READ BACK BY AND VERIFIED WITH: PHARMD Annmarie Basket on [id number] @0929  by SM Performed at [facility], [address] [place], [place] [address]    Culture GRAM POSITIVE COCCI  Final   Report Status PENDING  Incomplete  Blood culture (routine x 2)     Status: None (Preliminary result)   Collection Time: 03/27/24  2:44 PM   Specimen: BLOOD RIGHT ARM  Result Value Ref Range Status   Specimen Description   Final    BLOOD RIGHT ARM Performed at [facility], [address], [place], [place] [address]    Special Requests   Final    BOTTLES DRAWN AEROBIC AND ANAEROBIC  Blood Culture results may not be optimal due to an inadequate volume of blood received in culture bottles Performed at [facility], [address], [place], [place] [address]    Culture  Setup Time  Final    GRAM POSITIVE COCCI IN BOTH AEROBIC AND ANAEROBIC BOTTLES CRITICAL VALUE NOTED.  VALUE IS CONSISTENT WITH PREVIOUSLY REPORTED AND CALLED VALUE. Performed at [facility], [address], [place], [place] [address]    Culture GRAM POSITIVE COCCI  Final   Report Status PENDING  Incomplete  Blood Culture ID Panel (Reflexed)     Status: Abnormal   Collection Time: 03/27/24  2:44 PM  Result Value Ref Range Status   Enterococcus faecalis DETECTED (A) NOT DETECTED Final    Comment: CRITICAL RESULT CALLED TO, READ BACK BY AND VERIFIED WITH: PHARMD Justin L on [id number] @0929  by SM    Enterococcus Faecium NOT DETECTED NOT DETECTED Final   Listeria monocytogenes NOT DETECTED NOT DETECTED Final   Staphylococcus species NOT DETECTED NOT DETECTED Final   Staphylococcus aureus (BCID) NOT DETECTED NOT DETECTED Final   Staphylococcus epidermidis NOT DETECTED NOT DETECTED Final   Staphylococcus lugdunensis NOT DETECTED NOT DETECTED Final   Streptococcus species NOT DETECTED NOT DETECTED Final   Streptococcus agalactiae NOT DETECTED NOT DETECTED Final   Streptococcus pneumoniae NOT DETECTED NOT DETECTED Final   Streptococcus pyogenes NOT DETECTED NOT DETECTED Final   A.calcoaceticus-baumannii NOT DETECTED NOT DETECTED Final   Bacteroides fragilis NOT DETECTED NOT DETECTED Final   Enterobacterales NOT DETECTED NOT DETECTED Final   Enterobacter cloacae complex NOT DETECTED NOT DETECTED Final   Escherichia coli NOT DETECTED NOT DETECTED Final   Klebsiella aerogenes NOT DETECTED NOT DETECTED Final   Klebsiella oxytoca NOT DETECTED NOT DETECTED Final   Klebsiella pneumoniae NOT DETECTED NOT DETECTED Final   Proteus species NOT DETECTED NOT DETECTED Final   Salmonella species  NOT DETECTED NOT DETECTED Final   Serratia marcescens NOT DETECTED NOT DETECTED Final   Haemophilus influenzae NOT DETECTED NOT DETECTED Final   Neisseria meningitidis NOT DETECTED NOT DETECTED Final   Pseudomonas aeruginosa NOT DETECTED NOT DETECTED Final   Stenotrophomonas maltophilia NOT DETECTED NOT DETECTED Final   Candida albicans NOT DETECTED NOT DETECTED Final   Candida auris NOT DETECTED NOT DETECTED Final   Candida glabrata NOT DETECTED NOT DETECTED Final   Candida krusei NOT DETECTED NOT DETECTED Final   Candida parapsilosis NOT DETECTED NOT DETECTED Final   Candida tropicalis NOT DETECTED NOT DETECTED Final   Cryptococcus neoformans/gattii NOT DETECTED NOT DETECTED Final   Vancomycin resistance NOT DETECTED NOT DETECTED Final    Comment: Performed at [facility], [address], [place], [place] [address]         Radiology Studies: MR Lumbar Spine W Wo Contrast Result Date: 03/27/2024 CLINICAL DATA:  Low back pain, cancer suspected Low back pain, cauda equina syndrome suspected. EXAM: MRI LUMBAR SPINE WITHOUT AND WITH CONTRAST TECHNIQUE: Multiplanar and multiecho pulse sequences of the lumbar spine were obtained without and with intravenous contrast. CONTRAST:  9mL GADAVIST  GADOBUTROL  1 MMOL/ML IV SOLN COMPARISON:  None Available. FINDINGS: Segmentation:  Standard. Alignment: No substantial sagittal subluxation. Vertebrae: Right eccentric degenerative/discogenic endplate signal change at L4-L5. No specific evidence of acute fracture or discitis/osteomyelitis. No suspicious bone lesion. Conus medullaris and cauda equina: Conus extends to the L1-L2 level. Conus and cauda equina appear normal. Paraspinal and other soft tissues: Mild left hydronephrosis. Disc levels: T12-L1: No significant disc protrusion, foraminal stenosis, or canal stenosis. L1-L2: Left eccentric disc protrusion results in mild left foraminal stenosis. Patent canal and right foramen. L2-L3: Mild disc bulging.  Mild facet arthropathy. No significant canal or foraminal stenosis L3-L4: Broad disc  bulge with superimposed right subarticular disc protrusion which extends inferiorly. Bilateral facet arthropathy. Resulting moderate to severe right subarticular recess stenosis with potential impingement of descending right L4 nerve root. Mild to moderate canal stenosis. Mild right foraminal stenosis. L4-L5: Broad disc bulge with right subarticular disc protrusion. Bilateral facet arthropathy. Resulting moderate right subarticular recess stenosis with potential impingement of the descending right L5 nerve roots. Mild canal stenosis. Moderate right and mild-to-moderate left foraminal stenosis. L5-S1: Right eccentric disc bulge and bilateral facet arthropathy. Superiorly directed right subarticular disc protrusion. Patent canal. Moderate left foraminal stenosis. IMPRESSION: 1. At L3-L4, right subarticular disc protrusion results in moderate to severe right subarticular recess stenosis with potential impingement of descending right L4 nerve root. Mild to moderate canal stenosis and mild right foraminal stenosis. 2. At L4-L5, right subarticular disc protrusion results in moderate right subarticular recess stenosis with potential impingement of descending right L5 nerve roots. Mild canal stenosis. Moderate right and mild-to-moderate left foraminal stenosis. 3. At L5-S1, moderate left foraminal stenosis. 4. Mild left hydronephrosis, better characterized on recent CT Electronically Signed   By: Stevenson Elbe M.D.   On: 03/27/2024 21:38   CT Renal Stone Study Result Date: 03/27/2024 CLINICAL DATA:  Abdominal and flank pain. EXAM: CT ABDOMEN AND PELVIS WITHOUT CONTRAST TECHNIQUE: Multidetector CT imaging of the abdomen and pelvis was performed following the standard protocol without IV contrast. RADIATION DOSE REDUCTION: This exam was performed according to the departmental dose-optimization program which includes automated exposure  control, adjustment of the mA and/or kV according to patient size and/or use of iterative reconstruction technique. COMPARISON:  CT abdomen and pelvis 03/01/2024. FINDINGS: Lower chest: No acute abnormality. Hepatobiliary: No focal liver abnormality is seen. No gallstones, gallbladder wall thickening, or biliary dilatation. Pancreas: Unremarkable. No pancreatic ductal dilatation or surrounding inflammatory changes. Spleen: Normal in size without focal abnormality. Adrenals/Urinary Tract: Patient is status post right nephrectomy and cystectomy. Left-sided urostomy present. A left ureteral stent is present. The proximal portion of the stent is coiled in the proximal left ureter. There is mild to moderate left-sided hydronephrosis with contrast seen in the left renal collecting system. There is mild left perinephric fat stranding. No contrast is seen distal to this point. The adrenal glands are within normal limits. Stomach/Bowel: Stomach is within normal limits. Appendix appears normal. No evidence of bowel wall thickening, distention, or inflammatory changes. Colonic diverticulosis is present. Vascular/Lymphatic: Aortic atherosclerosis. No enlarged abdominal or pelvic lymph nodes. Reproductive: Prostate gland is surgically absent. Other: There is a small amount of free fluid in the pelvis similar to the prior study. There is no focal abdominal wall hernia. Musculoskeletal: Degenerative changes affect the spine. There severe degenerative changes of the right hip. IMPRESSION: 1. Left ureteral stent is coiled in the proximal left ureter. There is mild to moderate left-sided hydronephrosis with contrast seen in the left renal collecting system only. Obstructive process not excluded secondary to stent positioning. Recommend clinical correlation and follow-up. 2. Left-sided urostomy. 3. Status post right nephrectomy and cystectomy. 4. Stable small amount of free fluid in the pelvis. 5. Colonic diverticulosis. 6. Aortic  atherosclerosis. Aortic Atherosclerosis (ICD10-I70.0). Electronically Signed   By: Tyron Gallon M.D.   On: 03/27/2024 19:09        Scheduled Meds:  apixaban   5 mg Oral BID   atorvastatin   40 mg Oral QHS   insulin  aspart  0-9 Units Subcutaneous Q4H   metoprolol  succinate  25 mg Oral Daily   pregabalin  75 mg Oral  Daily   Continuous Infusions:  sodium chloride  75 mL/hr at 03/28/24 1427   ampicillin (OMNIPEN) IV 2 g (03/28/24 1550)   cefTRIAXone  (ROCEPHIN )  IV 2 g (03/28/24 1441)     LOS: 0 days    Time spent: 40 minutes    Hilda Lovings, MD [facility]   To contact the attending provider between 7A-7P or the covering provider during after hours 7P-7A, please log into the web site [email or web address] and access using universal [place] password for that web site. If you do not have the password, please call the hospital operator.  03/28/2024, 4:18 PM

## 2024-03-28 NOTE — Consult Note (Addendum)
 [facility]    Date of Admission:  03/27/2024   Total days of inpatient antibiotics 0        Reason for Consult: E faecalis bacteremia    Principal Problem:   Lumbar radiculopathy Active Problems:   Bladder cancer (HCC)   Complicated UTI (urinary tract infection)   Hyponatremia   CKD (chronic kidney disease), stage III [facility])   Assessment: 73 year old male with parietal cancer status postchemotherapy, cystoprostatectomy, right nephroureterectomy,total urethrectomy now on nivolumab  every 4 weeks, admission in April for Serratia moccasins UTI/bacteremia treated with Cipro  and amoxicillin  x 2 weeks EOT 4/27, recent admission 5 9-5 10 for AKI underwent stent placement with urology presents with 1 day history of worsening back pain found to have #E faecalis bacteremia secondary to urinary versus port #Back pain #Bladder cancer, gets nivolumab  every 4 weeks via port - Patient denies any abdominal pain.  MR lumbar spine showed no concern for infection - CT renal study showed coiled stent in left ureter, mild to moderate hydronephrosis.  Obstructive process not excluded. -Urine obtained from ostomy which incubating, leukocyte positive - ID engaged due to E faecalis bacteremia.  Patient denies fevers or chills. Reccs: -Discontinue cefepime  - Continue ampicillin - Add ceftriaxone  - Urology has been consulted,await input -Follow-up blood cultures - Repeat blood cultures to ensure clearance - TTE, will need TEE likely - Will need port pulled after tte  Notified Dr. Maryalice Smaller oncology. - Follow urine cultures, will give dose of gentamicin  in history of Serratia moccasins  in urine so far -Relayed plan to primary -Standard precautions   Evaluation of this patient requires complex antimicrobial therapy evaluation and counseling + isolation needs for disease transmission risk assessment and mitigation   Microbiology:   Antibiotics: Cefepime   6/4-present Ampicillin 6/4-present  Cultures: Blood 6/4 2/2E faecalis Urine  Other   HPI: Robert [name] is a 73 y.o. male bladder cancer status post chemotherapy, cystoprostatectomy, right nephroureterectomy, total urethral, node dissection, left cutaneous ureterostomy on 2/25 nivolumab  every 4 weeks with last infusion about a week ago, hospitalization for complicated UTI with Serratia moccasins bacteremia treated with prophylaxis and amoxicillin  through 4/27, hyperlipidemia PAF, BCC, admission for AKI on 5/25/underwent with urology with severe low back pain that started day of admission .  On arrival he had temp of 99.7, WBC 13.4 K.  MRI of lumbar spine showed protrusion L3-L4, L4-L5.  No infection noted.  CT renal study showed left ureteral stent and proximal left ureter hydronephrosis.  Obstructive process not excluded.  ID on engaged due to E faecalis and blood.   Review of Systems: Review of Systems  All other systems reviewed and are negative.   Past Medical History:  Diagnosis Date   Anticoagulant long-term use    eliquis --- managed by cardiology   Arthritis    Bladder cancer [facility])    urologist--- dr Derrick Fling;  overlapping   Cataract    History of basal cell carcinoma (BCC) excision    History of COVID-19 05/2019   per pt mild symptoms that resolved   HLD (hyperlipidemia)    HTN (hypertension)    Hx of colonic polyps    Insulin  dependent type 2 diabetes mellitus [facility])    endocrinologist--- dr Aldona Amel --  (03-11-2022 per pt check blood sugar 1-2 times daily,  fasting sugar-- 118-120s)   Nocturia    Paroxysmal atrial fibrillation (HCC) 03/12/2019   cardiologist--- dr Floria Hurst   Renal disorder  Ureterocele    Use of cane as ambulatory aid     Social History   Tobacco Use   Smoking status: Some Days    Current packs/day: 0.00    Types: Cigars, Cigarettes    Start date: 2012    Last attempt to quit: 02/22/2011    Years since quitting: 13.1   Smokeless  tobacco: Never  Vaping Use   Vaping status: Never Used  Substance Use Topics   Alcohol  use: Yes    Alcohol /week: 3.0 standard drinks of alcohol     Types: 3 Shots of liquor per week    Comment: 6-8oz /wk OCC MARTINI 2 TO 3 X WEEK   Drug use: Never    Family History  Problem Relation Age of Onset   Pancreatic cancer Mother 32   Cancer Paternal Uncle        unknown type cancer   Aortic aneurysm Other        family history   Cancer Other        Breast (1st degree relative)   Colon cancer Neg Hx    Rectal cancer Neg Hx    Stomach cancer Neg Hx    Esophageal cancer Neg Hx    Scheduled Meds:  atorvastatin   40 mg Oral QHS   insulin  aspart  0-9 Units Subcutaneous Q4H   metoprolol  succinate  25 mg Oral Daily   pregabalin  75 mg Oral Daily   Continuous Infusions:  sodium chloride  75 mL/hr at 03/28/24 1427   ampicillin (OMNIPEN) IV 2 g (03/28/24 1325)   cefTRIAXone  (ROCEPHIN )  IV 2 g (03/28/24 1441)   gentamicin      PRN Meds:.acetaminophen  **OR** acetaminophen , HYDROmorphone  (DILAUDID ) injection, naLOXone (NARCAN)  injection, oxyCODONE  Allergies  Allergen Reactions   Lisinopril  Swelling and Other (See Comments)    Tongue and throat became swollen    OBJECTIVE: Blood pressure (!) 112/98, pulse 87, temperature 97.7 F (36.5 C), resp. rate 16, height 6\' 3"  (1.905 m), weight 90.7 kg, SpO2 98%.  Physical Exam Constitutional:      General: He is not in acute distress.    Appearance: He is normal weight. He is not toxic-appearing.  HENT:     Head: Normocephalic and atraumatic.     Right Ear: External ear normal.     Left Ear: External ear normal.     Nose: No congestion or rhinorrhea.     Mouth/Throat:     Mouth: Mucous membranes are moist.     Pharynx: Oropharynx is clear.  Eyes:     Extraocular Movements: Extraocular movements intact.     Conjunctiva/sclera: Conjunctivae normal.     Pupils: Pupils are equal, round, and reactive to light.  Cardiovascular:     Rate and  Rhythm: Normal rate and regular rhythm.     Heart sounds: No murmur heard.    No friction rub. No gallop.  Pulmonary:     Effort: Pulmonary effort is normal.     Breath sounds: Normal breath sounds.  Abdominal:     General: Abdomen is flat. Bowel sounds are normal.     Palpations: Abdomen is soft.  Musculoskeletal:        General: No swelling. Normal range of motion.     Cervical back: Normal range of motion and neck supple.  Skin:    General: Skin is warm and dry.  Neurological:     General: No focal deficit present.     Mental Status: He is oriented to person, place, and  time.  Psychiatric:        Mood and Affect: Mood normal.     Lab Results Lab Results  Component Value Date   WBC 12.0 (H) 03/28/2024   HGB 10.2 (L) 03/28/2024   HCT 30.8 (L) 03/28/2024   MCV 84.6 03/28/2024   PLT 152 03/28/2024    Lab Results  Component Value Date   CREATININE 1.24 03/28/2024   BUN 21 03/28/2024   NA 130 (L) 03/28/2024   K 4.4 03/28/2024   CL 99 03/28/2024   CO2 18 (L) 03/28/2024    Lab Results  Component Value Date   ALT 13 03/27/2024   AST 19 03/27/2024   ALKPHOS 81 03/27/2024   BILITOT 0.9 03/27/2024       Orlie Bjornstad, MD [facility] 03/28/2024, 2:42 PM

## 2024-03-28 NOTE — Consult Note (Addendum)
 Urology Consult Note   Requesting Attending Physician:  Armenta Landau, MD Service Providing Consult: Urology  Consulting Attending: Dr. 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   He presented to the ED today with severe low back pain. Labs showed WBC 13.4, cr 1.5 (baseline) which downtrended to 1.2 today, UA with positive nitrite, moderate leukocytes, and 21-50 WBCs. He has been afebrile at home and in the ED, denies flank pain, he has been making good urine via his ureterostomy.   CT obtained showing left stent in the proximal ureter with mild/moderate left hydro. He also underwent lumbar MRI showing L3-L4 and L4-L5 disc protrusion. He was admitted for consideration of epidural steroid injection with IR.  Past Medical History: Past Medical History:  Diagnosis Date   Anticoagulant long-term use    eliquis --- managed by cardiology   Arthritis    Bladder cancer [facility])    urologist--- dr Derrick Fling;  overlapping   Cataract    History of basal cell carcinoma (BCC) excision    History of COVID-19 05/2019   per pt mild symptoms that resolved   HLD (hyperlipidemia)    HTN (hypertension)    Hx of colonic polyps    Insulin  dependent type 2 diabetes mellitus [facility])    endocrinologist--- dr Aldona Amel --  (03-11-2022 per pt check blood sugar 1-2 times daily,  fasting sugar-- 118-120s)   Nocturia    Paroxysmal atrial fibrillation (HCC) 03/12/2019   cardiologist--- dr Floria Hurst   Renal disorder    Ureterocele    Use of cane as ambulatory aid     Past Surgical History:  Past Surgical History:  Procedure Laterality Date   BLEPHAROPLASTY Bilateral     per pt approx  2001;   upper eyelid's   COLONOSCOPY     last one approx 2023   CYSTOSCOPY W/ RETROGRADES N/A 12/08/2023   Procedure: CYSTOSCOPY;  Surgeon: Melody Spurling., MD;  Location: [facility];  Service: Urology;  Laterality: N/A;  390 MINUTES NEEDED FOR CASE   CYSTOSCOPY WITH URETEROSCOPY AND STENT PLACEMENT Right 12/02/2022   Procedure: CYSTOSCOPY WITH RIGHT URETEROSCOPY, RIGHT DIAGNOSTIC URETEROSCOPY;  Surgeon: Christina Coyer, MD;  Location: [facility];  Service: Urology;  Laterality: Right;  90 MINS FOR CASE   CYSTOSCOPY/RETROGRADE/URETEROSCOPY Bilateral 05/05/2023   Procedure: CYSTOSCOPY BILATERAL RETROGRADE PYELOGRAM RIGHT URETEROSCOPY RIGHT STENT PLACEMENT;  Surgeon: Christina Coyer, MD;  Location: [facility];  Service: Urology;  Laterality: Bilateral;  90 MINS FOR CASE   IR IMAGING GUIDED PORT INSERTION  05/30/2023   PATELLAR TENDON REPAIR Left 04/16/2004   @[facility]    ROBOT ASSITED LAPAROSCOPIC NEPHROURETERECTOMY Right 12/08/2023   Procedure: RIGHT ROBOT ASSISTED Nephroureterectomy;  Surgeon: Melody Spurling., MD;  Location: [facility];  Service: Urology;  Laterality: Right;   ROBOT LAP RADICAL CYSTOPROSTATECTOMY PELVIC LYMPHADENECTOMY, NEOBLADDER  12/08/2023   Procedure: ROBOT ASSISTED LAPAROSCOPIC RADICAL CYSTOPROSTATECTOMY;  Surgeon: Melody Spurling., MD;  Location: [facility];  Service: Urology;;   TOTAL URETHRECTOMY,RADICAL N/A 12/08/2023   Procedure: TOTAL URETHRECTOMY,RADICAL;  Surgeon: Melody Spurling., MD;  Location: [facility];  Service: Urology;  Laterality: N/A;   TRANSURETHRAL RESECTION OF BLADDER TUMOR N/A 06/18/2021   Procedure: TRANSURETHRAL RESECTION  OF BLADDER TUMOR (TURBT) WITH RIGHT URETERAL STENT PLACEMENT, RIGHT URETEROSCOPY WITH DISTRACTION OF TUMOR, FULGURATION;  Surgeon: Christina Coyer, MD;  Location: [facility];  Service: Urology;  Laterality: N/A;   TRANSURETHRAL RESECTION OF BLADDER TUMOR N/A 03/15/2022   Procedure:  TRANSURETHRAL RESECTION OF BLADDER TUMOR (TURBT) WITH BILATERAL URETEROSCOPY/URETHRAL/ DILATION/BILATERAL RETROGRADE PYELOGRAM/BIOPSY AND FULGURATION OF RIGHT URETERAL CANCER/RIGHT STENT PLACEMENT;  Surgeon: Christina Coyer, MD;  Location: [facility];  Service: Urology;  Laterality: N/A;   TRANSURETHRAL RESECTION OF BLADDER TUMOR N/A 05/05/2023   Procedure: TRANSURETHRAL RESECTION OF BLADDER TUMOR (TURBT) BLADDER BIOSPY;  Surgeon: Christina Coyer, MD;  Location: [facility];  Service: Urology;  Laterality: N/A;   TRANSURETHRAL RESECTION OF BLADDER TUMOR WITH MITOMYCIN -C N/A 12/02/2022   Procedure: TRANSURETHRAL RESECTION OF BLADDER TUMOR;  Surgeon: Christina Coyer, MD;  Location: [facility];  Service: Urology;  Laterality: N/A;   TRANSURETHRAL RESECTION OF PROSTATE  05/05/2023   Procedure: TRANSURETHRAL RESECTION OF THE PROSTATE (TURP);  Surgeon: Christina Coyer, MD;  Location: [facility];  Service: Urology;;    Medication: Current Facility-Administered Medications  Medication Dose Route Frequency Provider Last Rate Last Admin   acetaminophen  (TYLENOL ) tablet 650 mg  650 mg Oral Q6H PRN Juliette Oh, MD       Or   acetaminophen  (TYLENOL ) suppository 650 mg  650 mg Rectal Q6H PRN Juliette Oh, MD       atorvastatin  (LIPITOR) tablet 40 mg  40 mg Oral QHS Juliette Oh, MD       ceFEPIme  (MAXIPIME ) 2 g in sodium chloride  0.9 % 100 mL IVPB  2 g Intravenous Q8H Lilliston, Andrea M, RPH 200 mL/hr at 03/28/24 0548 2 g at 03/28/24 0548   HYDROmorphone  (DILAUDID ) injection 1 mg  1 mg Intravenous Q4H PRN Rathore, Vasundhra, MD   1 mg at 03/28/24 0102   insulin  aspart (novoLOG ) injection 0-9 Units  0-9 Units Subcutaneous Q4H Rathore, Vasundhra, MD   2 Units at 03/28/24 7253   metoprolol  succinate (TOPROL -XL) 24 hr tablet 25 mg  25 mg Oral Daily Rathore, Vasundhra, MD       naloxone (NARCAN) injection 0.4 mg  0.4 mg Intravenous PRN Rathore,  Vasundhra, MD       oxyCODONE  (Oxy IR/ROXICODONE ) immediate release tablet 5 mg  5 mg Oral Q6H PRN Rathore, Vasundhra, MD   5 mg at 03/28/24 6644   Current Outpatient Medications  Medication Sig Dispense Refill   apixaban  (ELIQUIS ) 5 MG TABS tablet Take 1 tablet (5 mg total) by mouth 2 (two) times daily. 180 tablet 1   atorvastatin  (LIPITOR) 40 MG tablet Take 1 tablet (40 mg total) by mouth at bedtime.     B Complex-C (B-COMPLEX WITH VITAMIN C) tablet Take 1 tablet by mouth 3 (three) times a week.     Cholecalciferol (VITAMIN D-3 PO) Take 1 tablet by mouth 3 (three) times a week.     EPINEPHrine  0.3 mg/0.3 mL IJ SOAJ injection Inject 0.3 mg into the muscle as needed for anaphylaxis. 1 each 0   metoprolol  succinate (TOPROL -XL) 25 MG 24 hr tablet Take 1 tablet (25 mg total) by mouth daily. 90 tablet 2   propranolol  (INDERAL ) 10 MG tablet TAKE 1 TABLET BY MOUTH 4 TIMES DAILY AS NEEDED FOR PALPITATIONS, IRREGULAR OR FAST HEART RATE 270 tablet 1   Semaglutide , 1 MG/DOSE, (OZEMPIC , 1 MG/DOSE,) 4 MG/3ML SOPN INJECT 1 MG UNDER THE SKIN ONCE A WEEK AS DIRECTED (Patient taking differently: Inject 1  mg into the skin every Saturday.) 9 mL 3   amLODipine  (NORVASC ) 5 MG tablet Take 1 tablet (5 mg total) by mouth daily. (Patient not taking: Reported on 03/26/2024) 30 tablet 0   ciprofloxacin  (CIPRO ) 500 MG tablet Take 1 tablet (500 mg total) by mouth 2 (two) times daily for 10 days. (Patient not taking: Reported on 03/21/2024) 20 tablet 0   glucose blood test strip 1 each by Other route daily. Use daily for glucose control - Dx 250.00 100 each 0   Facility-Administered Medications Ordered in Other Encounters  Medication Dose Route Frequency Provider Last Rate Last Admin   sodium chloride  flush (NS) 0.9 % injection 10 mL  10 mL Intracatheter PRN [name] Lee, MD   10 mL at 02/15/24 1353    Allergies: Allergies  Allergen Reactions   Lisinopril  Swelling and Other (See Comments)    Tongue and throat became swollen     Social History: Social History   Tobacco Use   Smoking status: Some Days    Current packs/day: 0.00    Types: Cigars, Cigarettes    Start date: 2012    Last attempt to quit: 02/22/2011    Years since quitting: 13.1   Smokeless tobacco: Never  Vaping Use   Vaping status: Never Used  Substance Use Topics   Alcohol  use: Yes    Alcohol /week: 3.0 standard drinks of alcohol     Types: 3 Shots of liquor per week    Comment: 6-8oz /wk OCC MARTINI 2 TO 3 X WEEK   Drug use: Never    Family History Family History  Problem Relation Age of Onset   Pancreatic cancer Mother 107   Cancer Paternal Uncle        unknown type cancer   Aortic aneurysm Other        family history   Cancer Other        Breast (1st degree relative)   Colon cancer Neg Hx    Rectal cancer Neg Hx    Stomach cancer Neg Hx    Esophageal cancer Neg Hx     Review of Systems 10 systems were reviewed and are negative except as noted specifically in the HPI.  Objective   Vital signs in last 24 hours: BP 133/83 (BP Location: Right Arm)   Pulse 80   Temp 97.9 F (36.6 C) (Oral)   Resp 15   Ht 6\' 3"  (1.905 m)   Wt 90.7 kg   SpO2 97%   BMI 25.00 kg/m   Physical Exam General: NAD, A&O, resting, appropriate HEENT: [place]/AT, EOMI, MMM Pulmonary: Normal work of breathing Cardiovascular: HDS, adequate peripheral perfusion Abdomen: Soft, NTTP, nondistended. Urostomy in place with ureteral stent, large volume clear yellow urine in bag. GU: No CVA tenderness Extremities: warm and well perfused Neuro: Appropriate, no focal neurological deficits  Most Recent Labs: Lab Results  Component Value Date   WBC 12.0 (H) 03/28/2024   HGB 10.2 (L) 03/28/2024   HCT 30.8 (L) 03/28/2024   PLT 152 03/28/2024    Lab Results  Component Value Date   NA 130 (L) 03/28/2024   K 4.4 03/28/2024   CL 99 03/28/2024   CO2 18 (L) 03/28/2024   BUN 21 03/28/2024   CREATININE 1.24 03/28/2024   CALCIUM  9.0 03/28/2024   MG 1.8  02/07/2024   PHOS 4.7 (H) 03/02/2024    No results found for: "INR", "APTT"   Urine Culture: @[id number] (laburin,org,[id number],[id number])@   IMAGING: MR Lumbar Spine W Wo Contrast  Result Date: 03/27/2024 CLINICAL DATA:  Low back pain, cancer suspected Low back pain, cauda equina syndrome suspected. EXAM: MRI LUMBAR SPINE WITHOUT AND WITH CONTRAST TECHNIQUE: Multiplanar and multiecho pulse sequences of the lumbar spine were obtained without and with intravenous contrast. CONTRAST:  9mL GADAVIST  GADOBUTROL  1 MMOL/ML IV SOLN COMPARISON:  None Available. FINDINGS: Segmentation:  Standard. Alignment: No substantial sagittal subluxation. Vertebrae: Right eccentric degenerative/discogenic endplate signal change at L4-L5. No specific evidence of acute fracture or discitis/osteomyelitis. No suspicious bone lesion. Conus medullaris and cauda equina: Conus extends to the L1-L2 level. Conus and cauda equina appear normal. Paraspinal and other soft tissues: Mild left hydronephrosis. Disc levels: T12-L1: No significant disc protrusion, foraminal stenosis, or canal stenosis. L1-L2: Left eccentric disc protrusion results in mild left foraminal stenosis. Patent canal and right foramen. L2-L3: Mild disc bulging. Mild facet arthropathy. No significant canal or foraminal stenosis L3-L4: Broad disc bulge with superimposed right subarticular disc protrusion which extends inferiorly. Bilateral facet arthropathy. Resulting moderate to severe right subarticular recess stenosis with potential impingement of descending right L4 nerve root. Mild to moderate canal stenosis. Mild right foraminal stenosis. L4-L5: Broad disc bulge with right subarticular disc protrusion. Bilateral facet arthropathy. Resulting moderate right subarticular recess stenosis with potential impingement of the descending right L5 nerve roots. Mild canal stenosis. Moderate right and mild-to-moderate left foraminal stenosis. L5-S1: Right eccentric disc bulge and  bilateral facet arthropathy. Superiorly directed right subarticular disc protrusion. Patent canal. Moderate left foraminal stenosis. IMPRESSION: 1. At L3-L4, right subarticular disc protrusion results in moderate to severe right subarticular recess stenosis with potential impingement of descending right L4 nerve root. Mild to moderate canal stenosis and mild right foraminal stenosis. 2. At L4-L5, right subarticular disc protrusion results in moderate right subarticular recess stenosis with potential impingement of descending right L5 nerve roots. Mild canal stenosis. Moderate right and mild-to-moderate left foraminal stenosis. 3. At L5-S1, moderate left foraminal stenosis. 4. Mild left hydronephrosis, better characterized on recent CT Electronically Signed   By: Stevenson Elbe M.D.   On: 03/27/2024 21:38   CT Renal Stone Study Result Date: 03/27/2024 CLINICAL DATA:  Abdominal and flank pain. EXAM: CT ABDOMEN AND PELVIS WITHOUT CONTRAST TECHNIQUE: Multidetector CT imaging of the abdomen and pelvis was performed following the standard protocol without IV contrast. RADIATION DOSE REDUCTION: This exam was performed according to the departmental dose-optimization program which includes automated exposure control, adjustment of the mA and/or kV according to patient size and/or use of iterative reconstruction technique. COMPARISON:  CT abdomen and pelvis 03/01/2024. FINDINGS: Lower chest: No acute abnormality. Hepatobiliary: No focal liver abnormality is seen. No gallstones, gallbladder wall thickening, or biliary dilatation. Pancreas: Unremarkable. No pancreatic ductal dilatation or surrounding inflammatory changes. Spleen: Normal in size without focal abnormality. Adrenals/Urinary Tract: Patient is status post right nephrectomy and cystectomy. Left-sided urostomy present. A left ureteral stent is present. The proximal portion of the stent is coiled in the proximal left ureter. There is mild to moderate left-sided  hydronephrosis with contrast seen in the left renal collecting system. There is mild left perinephric fat stranding. No contrast is seen distal to this point. The adrenal glands are within normal limits. Stomach/Bowel: Stomach is within normal limits. Appendix appears normal. No evidence of bowel wall thickening, distention, or inflammatory changes. Colonic diverticulosis is present. Vascular/Lymphatic: Aortic atherosclerosis. No enlarged abdominal or pelvic lymph nodes. Reproductive: Prostate gland is surgically absent. Other: There is a small amount of free fluid in the pelvis similar to the prior study.  There is no focal abdominal wall hernia. Musculoskeletal: Degenerative changes affect the spine. There severe degenerative changes of the right hip. IMPRESSION: 1. Left ureteral stent is coiled in the proximal left ureter. There is mild to moderate left-sided hydronephrosis with contrast seen in the left renal collecting system only. Obstructive process not excluded secondary to stent positioning. Recommend clinical correlation and follow-up. 2. Left-sided urostomy. 3. Status post right nephrectomy and cystectomy. 4. Stable small amount of free fluid in the pelvis. 5. Colonic diverticulosis. 6. Aortic atherosclerosis. Aortic Atherosclerosis (ICD10-I70.0). Electronically Signed   By: Tyron Gallon M.D.   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 He is admitted for severe lower back pain in the setting of lumbar disc protrustion, urology consulted for mild ureteral stent malpositioning.   Recommendations: - Cr improved to 1.2 this morning, adequate urine output, no flank pain. - No acute urologic intervention is indicated. Continue ureteral stent.   - While urine culture difficult to interpret ISO urostomy bag/indwelling stent, agree with abx given patient to receive steroids and is immunosuppressed  - If patient becomes anuric, develops left flank pain, and/or becomes febrile/hemodynamically stable, please page urology for consideration of stent exchange - Patient will follow up with Dr. Secundino Dach as scheduled. He is aware of patient's admission.    Thank you for this consult. Please contact the urology consult pager with any further questions/concerns.  I have seen and examined the patient and agree with the above assessment and plan.  Reviewed images - proximal curl within proximal ureter and mild hydronephrosis. Excellent urine output via urostomy. Pt denies flank pain. Described neurologic low back pain. Creatinine downtrending. No plan for stent exchange presently. Will follow as scheduled with Dr. Secundino Dach.  Matt R. Ashleymarie Granderson MD [facility]  Pager: [phone]

## 2024-03-28 NOTE — Progress Notes (Signed)
 PHARMACY - PHYSICIAN COMMUNICATION CRITICAL VALUE ALERT - BLOOD CULTURE IDENTIFICATION (BCID)  Robert [name] is an 73 y.o. male who presented to [facility] on 03/27/2024 with a chief complaint of low back pain.   Assessment:  73 year old male with complicated urologic history presenting with low back pain. Now found to have E faecalis in 3/4 blood cultures. Likely urinary source. Noted last ureteral stent exchange on 5/9. Patient has a history of serratia bacteremia with serratia and enterococcus in the urine in April.   Name of physician (or Provider) Contacted: Juliet Ogle (ID)   Current antibiotics: Cefepime   Changes to prescribed antibiotics recommended:  Add ampicillin 2 gm IV Q 4 hours  Will discuss cefepime  with ID this PM   Results for orders placed or performed during the hospital encounter of 03/27/24  Blood Culture ID Panel (Reflexed) (Collected: 03/27/2024  2:44 PM)  Result Value Ref Range   Enterococcus faecalis DETECTED (A) NOT DETECTED   Enterococcus Faecium NOT DETECTED NOT DETECTED   Listeria monocytogenes NOT DETECTED NOT DETECTED   Staphylococcus species NOT DETECTED NOT DETECTED   Staphylococcus aureus (BCID) NOT DETECTED NOT DETECTED   Staphylococcus epidermidis NOT DETECTED NOT DETECTED   Staphylococcus lugdunensis NOT DETECTED NOT DETECTED   Streptococcus species NOT DETECTED NOT DETECTED   Streptococcus agalactiae NOT DETECTED NOT DETECTED   Streptococcus pneumoniae NOT DETECTED NOT DETECTED   Streptococcus pyogenes NOT DETECTED NOT DETECTED   A.calcoaceticus-baumannii NOT DETECTED NOT DETECTED   Bacteroides fragilis NOT DETECTED NOT DETECTED   Enterobacterales NOT DETECTED NOT DETECTED   Enterobacter cloacae complex NOT DETECTED NOT DETECTED   Escherichia coli NOT DETECTED NOT DETECTED   Klebsiella aerogenes NOT DETECTED NOT DETECTED   Klebsiella oxytoca NOT DETECTED NOT DETECTED   Klebsiella pneumoniae NOT DETECTED NOT DETECTED   Proteus species  NOT DETECTED NOT DETECTED   Salmonella species NOT DETECTED NOT DETECTED   Serratia marcescens NOT DETECTED NOT DETECTED   Haemophilus influenzae NOT DETECTED NOT DETECTED   Neisseria meningitidis NOT DETECTED NOT DETECTED   Pseudomonas aeruginosa NOT DETECTED NOT DETECTED   Stenotrophomonas maltophilia NOT DETECTED NOT DETECTED   Candida albicans NOT DETECTED NOT DETECTED   Candida auris NOT DETECTED NOT DETECTED   Candida glabrata NOT DETECTED NOT DETECTED   Candida krusei NOT DETECTED NOT DETECTED   Candida parapsilosis NOT DETECTED NOT DETECTED   Candida tropicalis NOT DETECTED NOT DETECTED   Cryptococcus neoformans/gattii NOT DETECTED NOT DETECTED   Vancomycin resistance NOT DETECTED NOT DETECTED    Denson Flake, PharmD, BCPS, BCIDP Infectious Diseases Clinical Pharmacist Phone: [phone] 03/28/2024  9:56 AM

## 2024-03-28 NOTE — Progress Notes (Signed)
 IR consulted for [facility] for lumbar radiculopathy. Case reviewed with Dr. Darylene Epley who recommends outpatient referral to the [facility]. Dr. Hildy Lowers made aware. Order placed for outpatient referral to the [facility].   The inpatient order will be deleted.   Hargun Spurling, AGACNP-BC 03/28/2024, 10:45 AM

## 2024-03-28 NOTE — Plan of Care (Signed)
  Problem: Education: Goal: Knowledge of the procedure and recovery process will improve Outcome: Progressing   Problem: Bowel/Gastric: Goal: Gastrointestinal status for postoperative course will improve Outcome: Progressing   Problem: Pain Management: Goal: General experience of comfort will improve Outcome: Progressing   Problem: Skin Integrity: Goal: Demonstration of wound healing without infection will improve Outcome: Progressing   Problem: Urinary Elimination: Goal: Ability to avoid or minimize complications of infection will improve Outcome: Progressing Goal: Ability to achieve and maintain urine output will improve Outcome: Progressing Goal: Home care management will improve Outcome: Progressing   Problem: Education: Goal: Ability to describe self-care measures that may prevent or decrease complications (Diabetes Survival Skills Education) will improve Outcome: Progressing Goal: Individualized Educational Video(s) Outcome: Progressing   Problem: Coping: Goal: Ability to adjust to condition or change in health will improve Outcome: Progressing   Problem: Fluid Volume: Goal: Ability to maintain a balanced intake and output will improve Outcome: Progressing   Problem: Health Behavior/Discharge Planning: Goal: Ability to identify and utilize available resources and services will improve Outcome: Progressing Goal: Ability to manage health-related needs will improve Outcome: Progressing   Problem: Metabolic: Goal: Ability to maintain appropriate glucose levels will improve Outcome: Progressing   Problem: Nutritional: Goal: Maintenance of adequate nutrition will improve Outcome: Progressing Goal: Progress toward achieving an optimal weight will improve Outcome: Progressing   Problem: Skin Integrity: Goal: Risk for impaired skin integrity will decrease Outcome: Progressing   Problem: Tissue Perfusion: Goal: Adequacy of tissue perfusion will improve Outcome:  Progressing   Problem: Education: Goal: Knowledge of General Education information will improve Description: Including pain rating scale, medication(s)/side effects and non-pharmacologic comfort measures Outcome: Progressing   Problem: Health Behavior/Discharge Planning: Goal: Ability to manage health-related needs will improve Outcome: Progressing   Problem: Clinical Measurements: Goal: Ability to maintain clinical measurements within normal limits will improve Outcome: Progressing Goal: Will remain free from infection Outcome: Progressing Goal: Diagnostic test results will improve Outcome: Progressing Goal: Respiratory complications will improve Outcome: Progressing Goal: Cardiovascular complication will be avoided Outcome: Progressing   Problem: Activity: Goal: Risk for activity intolerance will decrease Outcome: Progressing   Problem: Nutrition: Goal: Adequate nutrition will be maintained Outcome: Progressing   Problem: Coping: Goal: Level of anxiety will decrease Outcome: Progressing   Problem: Elimination: Goal: Will not experience complications related to bowel motility Outcome: Progressing Goal: Will not experience complications related to urinary retention Outcome: Progressing   Problem: Pain Managment: Goal: General experience of comfort will improve and/or be controlled Outcome: Progressing   Problem: Safety: Goal: Ability to remain free from injury will improve Outcome: Progressing   Problem: Skin Integrity: Goal: Risk for impaired skin integrity will decrease Outcome: Progressing

## 2024-03-29 ENCOUNTER — Inpatient Hospital Stay (HOSPITAL_COMMUNITY)

## 2024-03-29 DIAGNOSIS — I480 Paroxysmal atrial fibrillation: Secondary | ICD-10-CM

## 2024-03-29 DIAGNOSIS — N390 Urinary tract infection, site not specified: Secondary | ICD-10-CM | POA: Diagnosis not present

## 2024-03-29 DIAGNOSIS — E871 Hypo-osmolality and hyponatremia: Secondary | ICD-10-CM

## 2024-03-29 DIAGNOSIS — I38 Endocarditis, valve unspecified: Secondary | ICD-10-CM | POA: Diagnosis not present

## 2024-03-29 DIAGNOSIS — B9689 Other specified bacterial agents as the cause of diseases classified elsewhere: Secondary | ICD-10-CM

## 2024-03-29 DIAGNOSIS — R7881 Bacteremia: Secondary | ICD-10-CM

## 2024-03-29 DIAGNOSIS — M5416 Radiculopathy, lumbar region: Secondary | ICD-10-CM | POA: Diagnosis not present

## 2024-03-29 DIAGNOSIS — N1831 Chronic kidney disease, stage 3a: Secondary | ICD-10-CM | POA: Diagnosis not present

## 2024-03-29 DIAGNOSIS — C675 Malignant neoplasm of bladder neck: Secondary | ICD-10-CM | POA: Diagnosis not present

## 2024-03-29 HISTORY — PX: IR REMOVAL TUN ACCESS W/ PORT W/O FL MOD SED: IMG2290

## 2024-03-29 LAB — CBC WITH DIFFERENTIAL/PLATELET
Abs Immature Granulocytes: 0.11 10*3/uL — ABNORMAL HIGH (ref 0.00–0.07)
Basophils Absolute: 0 10*3/uL (ref 0.0–0.1)
Basophils Relative: 0 %
Eosinophils Absolute: 0 10*3/uL (ref 0.0–0.5)
Eosinophils Relative: 0 %
HCT: 28.9 % — ABNORMAL LOW (ref 39.0–52.0)
Hemoglobin: 9.4 g/dL — ABNORMAL LOW (ref 13.0–17.0)
Immature Granulocytes: 1 %
Lymphocytes Relative: 12 %
Lymphs Abs: 1.4 10*3/uL (ref 0.7–4.0)
MCH: 27.7 pg (ref 26.0–34.0)
MCHC: 32.5 g/dL (ref 30.0–36.0)
MCV: 85.3 fL (ref 80.0–100.0)
Monocytes Absolute: 1.1 10*3/uL — ABNORMAL HIGH (ref 0.1–1.0)
Monocytes Relative: 9 %
Neutro Abs: 8.9 10*3/uL — ABNORMAL HIGH (ref 1.7–7.7)
Neutrophils Relative %: 78 %
Platelets: 158 10*3/uL (ref 150–400)
RBC: 3.39 MIL/uL — ABNORMAL LOW (ref 4.22–5.81)
RDW: 14 % (ref 11.5–15.5)
WBC: 11.5 10*3/uL — ABNORMAL HIGH (ref 4.0–10.5)
nRBC: 0 % (ref 0.0–0.2)

## 2024-03-29 LAB — ECHOCARDIOGRAM COMPLETE
Area-P 1/2: 3.08 cm2
S' Lateral: 3.9 cm

## 2024-03-29 LAB — BASIC METABOLIC PANEL WITH GFR
Anion gap: 9 (ref 5–15)
BUN: 23 mg/dL (ref 8–23)
CO2: 20 mmol/L — ABNORMAL LOW (ref 22–32)
Calcium: 8.7 mg/dL — ABNORMAL LOW (ref 8.9–10.3)
Chloride: 105 mmol/L (ref 98–111)
Creatinine, Ser: 1.16 mg/dL (ref 0.61–1.24)
GFR, Estimated: >60 mL/min (ref >60)
Glucose, Bld: 146 mg/dL — ABNORMAL HIGH (ref 70–99)
Potassium: 3.8 mmol/L (ref 3.5–5.1)
Sodium: 134 mmol/L — ABNORMAL LOW (ref 135–145)

## 2024-03-29 LAB — GLUCOSE, CAPILLARY
Glucose-Capillary: 164 mg/dL — ABNORMAL HIGH (ref 70–99)
Glucose-Capillary: 122 mg/dL — ABNORMAL HIGH (ref 70–99)
Glucose-Capillary: 282 mg/dL — ABNORMAL HIGH (ref 70–99)
Glucose-Capillary: 297 mg/dL — ABNORMAL HIGH (ref 70–99)
Glucose-Capillary: 322 mg/dL — ABNORMAL HIGH (ref 70–99)

## 2024-03-29 LAB — MAGNESIUM: Magnesium: 1.8 mg/dL (ref 1.7–2.4)

## 2024-03-29 LAB — URINE CULTURE: Culture: >=100000 — AB

## 2024-03-29 MED ORDER — DEXAMETHASONE SODIUM PHOSPHATE 10 MG/ML IJ SOLN
4.0000 mg | Freq: Three times a day (TID) | INTRAMUSCULAR | Status: DC
  Administered 2024-03-29 – 2024-04-03 (×16): 4 mg via INTRAVENOUS
  Filled 2024-03-29 (×16): qty 1

## 2024-03-29 MED ORDER — POLYETHYLENE GLYCOL 3350 17 G PO PACK
17.0000 g | PACK | Freq: Every day | ORAL | Status: DC
  Administered 2024-03-29 – 2024-03-31 (×2): 17 g via ORAL
  Filled 2024-03-29 (×4): qty 1

## 2024-03-29 MED ORDER — NALOXONE HCL 0.4 MG/ML IJ SOLN: 0.4000 mg | INTRAMUSCULAR | Status: DC | PRN

## 2024-03-29 MED ORDER — METHOCARBAMOL 500 MG PO TABS
500.0000 mg | ORAL_TABLET | Freq: Four times a day (QID) | ORAL | Status: DC | PRN
  Administered 2024-03-29 – 2024-04-03 (×12): 500 mg via ORAL
  Filled 2024-03-29 (×12): qty 1

## 2024-03-29 MED ORDER — LIDOCAINE-EPINEPHRINE 1 %-1:100000 IJ SOLN
20.0000 mL | Freq: Once | INTRAMUSCULAR | Status: DC
  Filled 2024-03-29: qty 20

## 2024-03-29 MED ORDER — INSULIN GLARGINE-YFGN 100 UNIT/ML ~~LOC~~ SOLN
5.0000 [IU] | Freq: Every day | SUBCUTANEOUS | Status: DC
  Administered 2024-03-29: 5 [IU] via SUBCUTANEOUS
  Filled 2024-03-29 (×2): qty 0.05

## 2024-03-29 MED ORDER — OXYCODONE HCL 5 MG PO TABS
5.0000 mg | ORAL_TABLET | Freq: Four times a day (QID) | ORAL | Status: DC | PRN
  Administered 2024-03-29 – 2024-04-02 (×11): 10 mg via ORAL
  Filled 2024-03-29 (×11): qty 2

## 2024-03-29 MED ORDER — ACETAMINOPHEN 500 MG PO TABS
1000.0000 mg | ORAL_TABLET | Freq: Three times a day (TID) | ORAL | Status: DC
  Administered 2024-03-29: 500 mg via ORAL
  Administered 2024-03-29 – 2024-04-03 (×13): 1000 mg via ORAL
  Filled 2024-03-29 (×15): qty 2

## 2024-03-29 MED ORDER — HEPARIN SOD (PORK) LOCK FLUSH 100 UNIT/ML IV SOLN
INTRAVENOUS | Status: AC
  Filled 2024-03-29: qty 5

## 2024-03-29 MED ORDER — LIDOCAINE HCL 1 % IJ SOLN
INTRAMUSCULAR | Status: AC
  Filled 2024-03-29: qty 20

## 2024-03-29 MED ORDER — LIDOCAINE-EPINEPHRINE 1 %-1:100000 IJ SOLN
INTRAMUSCULAR | Status: AC
  Filled 2024-03-29: qty 1

## 2024-03-29 MED ORDER — METOPROLOL SUCCINATE ER 25 MG PO TB24
25.0000 mg | ORAL_TABLET | Freq: Every day | ORAL | Status: DC
  Administered 2024-03-29 – 2024-04-02 (×5): 25 mg via ORAL
  Filled 2024-03-29 (×5): qty 1

## 2024-03-29 MED ORDER — INSULIN ASPART 100 UNIT/ML IJ SOLN
0.0000 [IU] | Freq: Three times a day (TID) | INTRAMUSCULAR | Status: DC
  Administered 2024-03-29 – 2024-03-30 (×2): 8 [IU] via SUBCUTANEOUS
  Administered 2024-03-30 (×2): 11 [IU] via SUBCUTANEOUS
  Administered 2024-03-31 (×2): 5 [IU] via SUBCUTANEOUS
  Administered 2024-03-31 – 2024-04-01 (×2): 11 [IU] via SUBCUTANEOUS
  Administered 2024-04-01: 5 [IU] via SUBCUTANEOUS
  Administered 2024-04-01: 8 [IU] via SUBCUTANEOUS
  Administered 2024-04-02: 5 [IU] via SUBCUTANEOUS
  Administered 2024-04-02: 3 [IU] via SUBCUTANEOUS
  Administered 2024-04-03: 11 [IU] via SUBCUTANEOUS
  Administered 2024-04-03: 8 [IU] via SUBCUTANEOUS

## 2024-03-29 MED ORDER — INSULIN ASPART 100 UNIT/ML IJ SOLN
0.0000 [IU] | Freq: Every day | INTRAMUSCULAR | Status: DC
  Administered 2024-03-29: 4 [IU] via SUBCUTANEOUS
  Administered 2024-04-02: 2 [IU] via SUBCUTANEOUS

## 2024-03-29 MED ORDER — GUAIFENESIN-DM 100-10 MG/5ML PO SYRP
5.0000 mL | ORAL_SOLUTION | ORAL | Status: DC | PRN
  Administered 2024-03-29: 5 mL via ORAL
  Filled 2024-03-29: qty 5

## 2024-03-29 MED ORDER — SENNOSIDES-DOCUSATE SODIUM 8.6-50 MG PO TABS
1.0000 | ORAL_TABLET | Freq: Two times a day (BID) | ORAL | Status: DC
  Administered 2024-03-29 – 2024-04-03 (×9): 1 via ORAL
  Filled 2024-03-29 (×9): qty 1

## 2024-03-29 MED ORDER — LIDOCAINE-EPINEPHRINE 1 %-1:100000 IJ SOLN
20.0000 mL | Freq: Once | INTRAMUSCULAR | Status: AC
  Administered 2024-03-29: 18 mL via INTRADERMAL
  Filled 2024-03-29: qty 20

## 2024-03-29 MED ORDER — LIDOCAINE 5 % EX PTCH
1.0000 | MEDICATED_PATCH | CUTANEOUS | Status: DC
  Administered 2024-03-29 – 2024-03-31 (×2): 1 via TRANSDERMAL
  Filled 2024-03-29 (×4): qty 1

## 2024-03-29 NOTE — Progress Notes (Signed)
 [facility]  Date of Admission:  03/27/2024   Total days of inpatient antibiotics 2  Principal Problem:   Lumbar radiculopathy Active Problems:   Paroxysmal A-fib (HCC)   Bladder cancer (HCC)   Bacteremia   Complicated UTI (urinary tract infection)   Hyponatremia   CKD (chronic kidney disease), stage III [facility])          Assessment: 73 year old male with bladder cancer status postchemotherapy, cystoprostatectomy, right nephroureterectomy,total urethrectomy now on nivolumab  every 4 weeks, admission in April for Serratia moccasins UTI/bacteremia treated with Cipro  and amoxicillin  x 2 weeks EOT 4/27, recent admission 5 9-5 10 for AKI underwent stent placement with urology presents with 1 day history of worsening back pain found to have   #E faecalis bacteremia secondary to UTI, patient has ostomy #Back pain #Bladder cancer, gets nivolumab  every 4 weeks via port - Patient denies any abdominal pain.  MR lumbar spine showed no concern for infection.  Urine and blood cultures from admission grew E faecalis(2/2 sets). - CT renal study showed coiled stent in left ureter, mild to moderate hydronephrosis.  Obstructive process not excluded. -Urine obtained from ostomy which incubating, leukocyte positive - ID engaged due to E faecalis bacteremia.  Patient denies fevers or chills.  Recommendations: -Continue ceftriaxone  and ampicillin to target E faecalis - Follow repeat blood cultures to ensure clearance - TTE, will likely need TEE - Will need port pulled after TTE resulted.  Oncology aware.,  Noted okay from oncology standpoint. - Urology engaged per malpositioned ureteral stent, awaiting recommendations - IR was engaged in regards to an radiculopathy.  Referred to outpatient spine center. - Standard precautions -Dr. Levern Reader is covering this weekend.  I will be back on service on Monday.  Evaluation of this patient requires complex antimicrobial therapy  evaluation and counseling + isolation needs for disease transmission risk assessment and mitigation    Microbiology:   Antibiotics: Ampicillin 6/5-present 6/4 cefepime  Ceftriaxone  6/4-present Gentamicin  6/5 Cultures: Blood 6/4 2/2 efaecalis 6/5 Urine 6/4 >100k e faecalis Other   SUBJECTIVE: Resting in bed. C/o back pain Interval: afebrile overnight wbc 11.5k  Review of Systems: Review of Systems  All other systems reviewed and are negative.    Scheduled Meds:  acetaminophen   1,000 mg Oral TID   apixaban   5 mg Oral BID   atorvastatin   40 mg Oral QHS   dexamethasone  (DECADRON ) injection  4 mg Intravenous Q8H   insulin  aspart  0-15 Units Subcutaneous TID WC   insulin  aspart  0-5 Units Subcutaneous QHS   insulin  aspart  0-9 Units Subcutaneous Q4H   insulin  glargine-yfgn  5 Units Subcutaneous Daily   lidocaine   1 patch Transdermal Q24H   metoprolol  succinate  25 mg Oral QHS   pregabalin  75 mg Oral Daily   Continuous Infusions:  sodium chloride  75 mL/hr at 03/28/24 1427   ampicillin (OMNIPEN) IV 2 g (03/29/24 1335)   cefTRIAXone  (ROCEPHIN )  IV 2 g (03/29/24 0907)   PRN Meds:.acetaminophen  **OR** acetaminophen , HYDROmorphone  (DILAUDID ) injection, methocarbamol, naLOXone (NARCAN)  injection, oxyCODONE  Allergies  Allergen Reactions   Lisinopril  Swelling and Other (See Comments)    Tongue and throat became swollen    OBJECTIVE: Vitals:   03/28/24 1913 03/28/24 2313 03/29/24 0349 03/29/24 1318  BP: 121/65 (!) 104/52 (!) 118/58 123/63  Pulse: 80 60 77 80  Resp: 15 15 18 18   Temp: 98 F (36.7 C) 98.3 F (36.8 C) 98.3 F (36.8  C) 98 F (36.7 C)  TempSrc: Oral Oral  Oral  SpO2: 100% 98% 100% 98%  Weight:      Height:       Body mass index is 25 kg/m.  Physical Exam Constitutional:      General: He is not in acute distress.    Appearance: He is normal weight. He is not toxic-appearing.  HENT:     Head: Normocephalic and atraumatic.     Right Ear:  External ear normal.     Left Ear: External ear normal.     Nose: No congestion or rhinorrhea.     Mouth/Throat:     Mouth: Mucous membranes are moist.     Pharynx: Oropharynx is clear.  Eyes:     Extraocular Movements: Extraocular movements intact.     Conjunctiva/sclera: Conjunctivae normal.     Pupils: Pupils are equal, round, and reactive to light.  Cardiovascular:     Rate and Rhythm: Normal rate and regular rhythm.     Heart sounds: No murmur heard.    No friction rub. No gallop.  Pulmonary:     Effort: Pulmonary effort is normal.     Breath sounds: Normal breath sounds.  Abdominal:     General: Bowel sounds are normal.     Palpations: Abdomen is soft.     Comments:  urostomy  Musculoskeletal:        General: No swelling.     Cervical back: Normal range of motion and neck supple.  Skin:    General: Skin is warm and dry.  Neurological:     General: No focal deficit present.  Psychiatric:        Mood and Affect: Mood normal.       Lab Results Lab Results  Component Value Date   WBC 11.5 (H) 03/29/2024   HGB 9.4 (L) 03/29/2024   HCT 28.9 (L) 03/29/2024   MCV 85.3 03/29/2024   PLT 158 03/29/2024    Lab Results  Component Value Date   CREATININE 1.16 03/29/2024   BUN 23 03/29/2024   NA 134 (L) 03/29/2024   K 3.8 03/29/2024   CL 105 03/29/2024   CO2 20 (L) 03/29/2024    Lab Results  Component Value Date   ALT 13 03/27/2024   AST 19 03/27/2024   ALKPHOS 81 03/27/2024   BILITOT 0.9 03/27/2024        Orlie Bjornstad, MD [facility] 03/29/2024, 1:44 PM

## 2024-03-29 NOTE — Progress Notes (Signed)
 OT Cancellation Note  Patient Details Name: Robert [name] MRN: [id number] DOB: 1951-03-07   Cancelled Treatment:    Reason Eval/Treat Not Completed: Patient at procedure or test/ unavailable OT attempted initial evaluation earlier and PT in room with patient in 10/10 pain with all mobility. OT re-attempt for eval and patient at a procedure. Will continue to follow for OT eval when patient able to participate and as soon as OT schedule allows.   Greco Gastelum OT/L Acute Rehabilitation Department  [phone]    03/29/2024, 3:35 PM

## 2024-03-29 NOTE — TOC Progression Note (Signed)
 Transition of Care [facility]) - Progression Note    Patient Details  Name: Robert [name] MRN: [id number] Date of Birth: 07-Nov-1950  Transition of Care [facility]) CM/SW Contact  Levie Ream, RN Phone Number: 03/29/2024, 3:02 PM  Clinical Narrative:    Pt in procedural area; no family at bedside; unable to complete TOC assessment.        Expected Discharge Plan and Services                                               Social Determinants of Health (SDOH) Interventions SDOH Screenings   Food Insecurity: No Food Insecurity (03/28/2024)  Housing: Low Risk  (03/28/2024)  Transportation Needs: No Transportation Needs (03/28/2024)  Utilities: Not At Risk (03/28/2024)  Alcohol  Screen: Low Risk  (10/08/2023)  Depression (PHQ2-9): Low Risk  (03/26/2024)  Financial Resource Strain: Low Risk  (10/08/2023)  Physical Activity: Insufficiently Active (03/26/2024)  Social Connections: Moderately Isolated (03/28/2024)  Stress: No Stress Concern Present (10/08/2023)  Tobacco Use: High Risk (03/27/2024)  Health Literacy: Adequate Health Literacy (03/26/2024)    Readmission Risk Interventions    02/07/2024    2:07 PM 12/10/2023    4:50 PM  Readmission Risk Prevention Plan  Transportation Screening Complete Complete  PCP or Specialist Appt within 5-7 Days  Complete  PCP or Specialist Appt within 3-5 Days Complete   Home Care Screening  Complete  Medication Review (RN CM)  Complete  HRI or Home Care Consult Complete   Social Work Consult for Recovery Care Planning/Counseling Complete   Palliative Care Screening Not Applicable   Medication Review Oceanographer) Referral to Pharmacy

## 2024-03-29 NOTE — Procedures (Signed)
 Interventional Radiology Procedure:   Indications: Bacteremia  Procedure: Port explant  Findings: Right jugular port was completely removed.  Port pocket looked normal and healthy.     Complications: None     EBL: Minimal, less than 10 ml  Plan: Keep port site and incision dry for at least 24 hours.     Jolyne Laye R. Julietta Ogren, MD  Pager: [phone]

## 2024-03-29 NOTE — Progress Notes (Signed)
  Echocardiogram 2D Echocardiogram has been performed.  Fain Home RDCS 03/29/2024, 4:12 PM

## 2024-03-29 NOTE — Progress Notes (Signed)
 Pt's pain is very uncontrolled, grimacing and wincing in pain.  He explained to primary RN that his pain is 10/10 despite medication and position changes for lower back pain.   RN let MD know. Waiting for possible orders

## 2024-03-29 NOTE — Progress Notes (Signed)
 PROGRESS NOTE    Robert [name]  WUJ:[id number] DOB: 1951-06-30 DOA: 03/27/2024 PCP: Genia Kettering, MD   Chief Complaint  Patient presents with   Back Pain    Brief Narrative:  Patient is a 73 year old gentleman with history of paroxysmal A-fib on Eliquis , hypertension, hyperlipidemia, type 2 diabetes, metastatic bladder cancer status post cystoprostatectomy, total neurectomy and right nephroureterectomy and solitary left kidney status post ureterostomy currently on immunotherapy for bladder cancer recently admitted for AKI from 03/01/2024 to 03/02/2024 with mild to moderate hydronephrosis with stent retracted to the mid ureter on imaging whereby patient underwent stent exchange per urology with improvement with renal function. Patient presenting to the ED with complaint of severe low back pain with radiation to the right lower extremity causing difficulty ambulating and standing.  Patient also noted to have a complicated UTI placed on IV antibiotics and pancultured.  Blood cultures positive for Enterococcus faecalis.  ID consulted.  IR also consulted for evaluation for epidural steroid injection however recommended outpatient referral.   Assessment & Plan:   Principal Problem:   Lumbar radiculopathy Active Problems:   Bacteremia   Bladder cancer (HCC)   Complicated UTI (urinary tract infection)   Paroxysmal A-fib (HCC)   Hyponatremia   CKD (chronic kidney disease), stage III (HCC)  #1 severe low back pain secondary to lumbar radiculopathy -Patient denied any bowel or urinary incontinence, no lower extremity weakness or sensory loss. - It is noted that ED physician discussed lumbar MRI results with on-call provider for neurosurgery who recommended consulting IR for epidural steroid injection. - Patient received IV Decadron  8 mg in the ED. - Eliquis  held. - IR consulted for epidural steroid injection and recommended outpatient referral which has been placed per IR. -  Patient still with significant pain from his lower back radiating to his right lower extremity with some spasms.   - Continue Lyrica.  - Start scheduled Tylenol  1000 mg 3 times daily, IV Decadron  4 mg every 8 hours, Lidoderm  patch.   - IV Dilaudid  as needed for severe pain, oxycodone  as needed, Robaxin as needed.   - Supportive care.   2.  Complicated UTI -Urinalysis concerning for UTI. -Urine cultures with > 100,000 colonies of Enterococcus faecalis with sensitivities pending. -CT showed left ureteral stent called in the proximal left ureter with mild to moderate left-sided hydronephrosis.  Patient seen in consultation by urology who feel no acute urological intervention is indicated at this time however if patient becomes anuric, develops left flank pain or becomes febrile urology will need to be paged for consideration of stent exchange. -Will need outpatient follow-up with primary urologist. - Patient on IV Rocephin , started on IV ampicillin due to bacteremia per ID.[name]  3.  Enterococcus faecalis bacteremia -Likely seeded from the urine. - Patient started on IV ampicillin. - Patient seen in consultation by ID who recommended discontinuation of IV cefepime  and placed patient on IV ampicillin and IV Rocephin . 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   - ID recommended port removal, IR consulted for port removal.  -Status post 1 dose of IV gentamicin  per ID due to history of Serratia.   - ID following and appreciate their input and recommendations.   4.  Mild hyponatremia - Improved with hydration.   5.  CKD  stage IIIa -Stable.  6.  Paroxysmal A-fib -Continue home regimen metoprolol  for rate control. - Eliquis  for anticoagulation.  7.  Hypertension -Continue metoprolol .  8.   Hyperlipidemia - Continue Lipitor.  9.  Type 2 diabetes mellitus -Hemoglobin A1c 7.1 on 02/13/2024. -CBG at 122 this morning however increased to 282 as patient started on IV Decadron . -Start Semglee 5 units daily and SSI.  10.  Chronic anemia -Hemoglobin stable at 9.4.   11.  Metastatic bladder cancer -Status post cystoprostatectomy, total ureterectomy and right nephro ureterectomy and solitary left kidney status post ureterostomy. - Currently on treatment with immunotherapy. - Oncology following and in agreement and okay with Port-A-Cath removal as recommended per ID due to bacteremia.     DVT prophylaxis: Eliquis  Code Status: Full Family Communication: Updated patient.  No family at bedside. Disposition: TBD  Status is: Inpatient The patient will require care spanning > 2 midnights and should be moved to inpatient because: Severity of illness   Consultants:  Urology: Dr. Freddi Jaeger 03/28/2024 ID Dr. Zelda Hickman 03/28/2024 Oncology.  Procedures:  CT renal stone protocol 03/27/2024 2D echo 03/29/2024  Antimicrobials:  Anti-infectives (From admission, onward)    Start     Dose/Rate Route Frequency Ordered Stop   03/28/24 1400  cefTRIAXone  (ROCEPHIN ) 2 g in sodium chloride  0.9 % 100 mL IVPB        2 g 200 mL/hr over 30 Minutes Intravenous Every 12 hours 03/28/24 1356     03/28/24 1400  gentamicin  (GARAMYCIN ) 450 mg in dextrose  5 % 50 mL IVPB        5 mg/kg  90.7 kg 122.5 mL/hr over 30 Minutes Intravenous  Once 03/28/24 1356 03/28/24 1650   03/28/24 1200  ampicillin (OMNIPEN) 2 g in sodium chloride  0.9 % 100 mL IVPB        2 g 300 mL/hr over 20 Minutes Intravenous Every 4 hours 03/28/24 1015     03/28/24 1000  ampicillin (OMNIPEN) 2 g in sodium chloride  0.9 % 100 mL IVPB  Status:  Discontinued        2 g 300 mL/hr over 20 Minutes Intravenous Every 4 hours 03/28/24 0952 03/28/24 1015   03/28/24 0600  ceFEPIme  (MAXIPIME ) 2 g in sodium chloride  0.9 % 100 mL IVPB  Status:  Discontinued         2 g 200 mL/hr over 30 Minutes Intravenous Every 8 hours 03/28/24 0330 03/28/24 1356   03/27/24 1715  cefTRIAXone  (ROCEPHIN ) 1 g in sodium chloride  0.9 % 100 mL IVPB        1 g 200 mL/hr over 30 Minutes Intravenous  Once 03/27/24 1701 03/27/24 1936         Subjective: Patient laying in bed.  Patient still with significant lower back pain radiating to his right lower extremity with some intermittent spasms with inability to stand up and ambulate.  Patient denies any chest pain, no shortness of breath, no abdominal pain.    Objective: Vitals:   03/28/24 1416 03/28/24 1913 03/28/24 2313 03/29/24 0349  BP: (!) 112/98 121/65 (!) 104/52 (!) 118/58  Pulse: 87 80 60 77  Resp:  15 15 18   Temp: 97.7 F (36.5 C) 98 F (36.7 C) 98.3 F (36.8 C) 98.3 F (36.8 C)  TempSrc:  Oral Oral   SpO2: 98% 100% 98% 100%  Weight:      Height:        Intake/Output Summary (Last 24 hours) at 03/29/2024 1141 Last data filed at 03/29/2024 [phone]  Gross per 24 hour  Intake 2100.06 ml  Output 1800 ml  Net 300.06 ml   Filed Weights   03/27/24 1327 03/27/24 1330  Weight: 90.7 kg 90.7 kg    Examination:  General exam: NAD.  Port-A-Cath in right upper chest wall with no erythema, nontender to palpation. Respiratory system: Lungs clear to auscultation bilaterally.  No wheezes, no crackles, no rhonchi.  Fair air movement.  Speaking in full sentences.  Cardiovascular system: Regular rate rhythm no murmurs rubs or gallops.  No JVD.  No pitting lower extremity edema.  Gastrointestinal system: Abdomen is soft, nontender, nondistended, positive bowel sounds.  No rebound.  No guarding.  Urostomy in place with clear yellow urine.  Central nervous system: Alert and oriented. No focal neurological deficits. Extremities: Symmetric 5 x 5 power. Skin: No rashes, lesions or ulcers Psychiatry: Judgement and insight appear normal. Mood & affect appropriate.     Data Reviewed: I have personally reviewed following labs  and imaging studies  CBC: Recent Labs  Lab 03/27/24 1444 03/28/24 0320 03/29/24 0448  WBC 13.4* 12.0* 11.5*  NEUTROABS 10.4*  --  8.9*  HGB 10.6* 10.2* 9.4*  HCT 31.2* 30.8* 28.9*  MCV 83.6 84.6 85.3  PLT 171 152 158    Basic Metabolic Panel: Recent Labs  Lab 03/27/24 1444 03/28/24 0320 03/29/24 0448  NA 130* 130* 134*  K 4.1 4.4 3.8  CL 98 99 105  CO2 20* 18* 20*  GLUCOSE 172* 171* 146*  BUN 26* 21 23  CREATININE 1.54* 1.24 1.16  CALCIUM  9.1 9.0 8.7*  MG  --   --  1.8    GFR: Estimated Creatinine Clearance: 67.8 mL/min (by C-G formula based on SCr of 1.16 mg/dL).  Liver Function Tests: Recent Labs  Lab 03/27/24 1444  AST 19  ALT 13  ALKPHOS 81  BILITOT 0.9  PROT 7.8  ALBUMIN 3.5    CBG: Recent Labs  Lab 03/28/24 1918 03/28/24 2340 03/29/24 0345 03/29/24 0724 03/29/24 1123  GLUCAP 155* 154* 164* 122* 282*     Recent Results (from the past 240 hours)  Urine Culture (for pregnant, neutropenic or urologic patients or patients with an indwelling urinary catheter)     Status: Abnormal   Collection Time: 03/27/24  2:06 PM   Specimen: Urine, Clean Catch  Result Value Ref Range Status   Specimen Description   Final    URINE, CLEAN CATCH Performed at [facility], [address], [place], [place] [address]    Special Requests   Final    NONE Performed at [facility], [address] [place], [place] [address]    Culture (A)  Final    >=100,000 COLONIES/mL ENTEROCOCCUS FAECALIS WITHIN MIXED ORGANISMS Performed at [facility], [address] [place], [place] [address]    Report Status 03/29/2024 FINAL  Final   Organism ID, Bacteria ENTEROCOCCUS FAECALIS (A)  Final      Susceptibility   Enterococcus faecalis - MIC*    AMPICILLIN <=2 SENSITIVE Sensitive     NITROFURANTOIN  <=16 SENSITIVE Sensitive     VANCOMYCIN 2 SENSITIVE Sensitive     * >=100,000 COLONIES/mL ENTEROCOCCUS FAECALIS  Blood culture  (routine x 2)     Status: Abnormal (Preliminary result)   Collection Time: 03/27/24  2:44 PM   Specimen: BLOOD LEFT ARM  Result Value Ref Range Status   Specimen Description   Final    BLOOD LEFT ARM Performed at [facility], [address] [place], [place] [address]    Special Requests   Final    BOTTLES DRAWN AEROBIC AND ANAEROBIC Blood Culture adequate volume Performed at [facility], [address] [place], [place] [address]    Culture  Setup Time   Final    GRAM POSITIVE COCCI IN CHAINS AEROBIC BOTTLE ONLY CRITICAL RESULT CALLED TO, READ BACK BY AND VERIFIED WITH: PHARMD Annmarie Basket on [id number] @0929  by SM    Culture (A)  Final    ENTEROCOCCUS FAECALIS SUSCEPTIBILITIES TO FOLLOW Performed at [facility], [address] [place], [place] [address]    Report Status PENDING  Incomplete  Blood culture (routine x 2)     Status: None (Preliminary result)   Collection Time: 03/27/24  2:44 PM   Specimen: BLOOD RIGHT ARM  Result Value Ref Range Status   Specimen Description   Final    BLOOD RIGHT ARM Performed at [facility], [address], [place], [place] [address]    Special Requests   Final    BOTTLES DRAWN AEROBIC AND ANAEROBIC Blood Culture results may not be optimal due to an inadequate volume of blood received in culture bottles Performed at [facility], [address] [place], [place] [address]    Culture  Setup Time   Final    GRAM POSITIVE COCCI IN BOTH AEROBIC AND ANAEROBIC BOTTLES CRITICAL VALUE NOTED.  VALUE IS CONSISTENT WITH PREVIOUSLY REPORTED AND CALLED VALUE.    Culture   Final    GRAM POSITIVE COCCI IDENTIFICATION TO FOLLOW Performed at [facility], [address], [place], [place] [address]    Report Status PENDING  Incomplete  Blood Culture ID Panel (Reflexed)     Status: Abnormal   Collection Time: 03/27/24  2:44 PM  Result Value Ref Range Status   Enterococcus faecalis DETECTED (A)  NOT DETECTED Final    Comment: CRITICAL RESULT CALLED TO, READ BACK BY AND VERIFIED WITH: PHARMD Justin L on [id number] @0929  by SM    Enterococcus Faecium NOT DETECTED NOT DETECTED Final   Listeria monocytogenes NOT DETECTED NOT DETECTED Final   Staphylococcus species NOT DETECTED NOT DETECTED Final   Staphylococcus aureus (BCID) NOT DETECTED NOT DETECTED Final   Staphylococcus epidermidis NOT DETECTED NOT DETECTED Final   Staphylococcus lugdunensis NOT DETECTED NOT DETECTED Final   Streptococcus species NOT DETECTED NOT DETECTED Final   Streptococcus agalactiae NOT DETECTED NOT DETECTED Final   Streptococcus pneumoniae NOT DETECTED NOT DETECTED Final   Streptococcus pyogenes NOT DETECTED NOT DETECTED Final   A.calcoaceticus-baumannii NOT DETECTED NOT DETECTED Final   Bacteroides fragilis NOT DETECTED NOT DETECTED Final   Enterobacterales NOT DETECTED NOT DETECTED Final   Enterobacter cloacae complex NOT DETECTED NOT DETECTED Final   Escherichia coli NOT DETECTED NOT DETECTED Final   Klebsiella aerogenes NOT DETECTED NOT DETECTED Final   Klebsiella oxytoca NOT DETECTED NOT DETECTED Final   Klebsiella pneumoniae NOT DETECTED NOT DETECTED Final   Proteus species NOT DETECTED NOT DETECTED Final   Salmonella species NOT DETECTED NOT DETECTED Final   Serratia marcescens NOT DETECTED NOT DETECTED Final   Haemophilus influenzae NOT DETECTED NOT DETECTED Final   Neisseria meningitidis NOT DETECTED NOT DETECTED Final   Pseudomonas aeruginosa NOT DETECTED NOT DETECTED Final   Stenotrophomonas maltophilia NOT DETECTED NOT DETECTED Final   Candida albicans NOT DETECTED NOT DETECTED Final   Candida auris NOT DETECTED NOT DETECTED Final   Candida  glabrata NOT DETECTED NOT DETECTED Final   Candida krusei NOT DETECTED NOT DETECTED Final   Candida parapsilosis NOT DETECTED NOT DETECTED Final   Candida tropicalis NOT DETECTED NOT DETECTED Final   Cryptococcus neoformans/gattii NOT DETECTED NOT DETECTED  Final   Vancomycin resistance NOT DETECTED NOT DETECTED Final    Comment: Performed at [facility], [address] [place], [place] [address]  Culture, blood (Routine X 2) w Reflex to ID Panel     Status: None (Preliminary result)   Collection Time: 03/28/24  4:15 PM   Specimen: BLOOD  Result Value Ref Range Status   Specimen Description   Final    BLOOD SITE NOT SPECIFIED Performed at [facility], [address] [place], [place] [address]    Special Requests   Final    BOTTLES DRAWN AEROBIC AND ANAEROBIC Blood Culture adequate volume Performed at [facility], [address], [place], [place] [address]    Culture   Final    NO GROWTH < 24 HOURS Performed at [facility], [address] [place], [place] [address]    Report Status PENDING  Incomplete  Culture, blood (Routine X 2) w Reflex to ID Panel     Status: None (Preliminary result)   Collection Time: 03/28/24  4:24 PM   Specimen: BLOOD  Result Value Ref Range Status   Specimen Description   Final    BLOOD SITE NOT SPECIFIED Performed at [facility], [address], [place], [place] [address]    Special Requests   Final    BOTTLES DRAWN AEROBIC AND ANAEROBIC Blood Culture adequate volume Performed at [facility], [address], [place], [place] [address]    Culture   Final    NO GROWTH < 24 HOURS Performed at [facility], [address] [place], [place] [address]    Report Status PENDING  Incomplete         Radiology Studies: MR Lumbar Spine W Wo Contrast Result Date: 03/27/2024 CLINICAL DATA:  Low back pain, cancer suspected Low back pain, cauda equina syndrome suspected. EXAM: MRI LUMBAR SPINE WITHOUT AND WITH CONTRAST TECHNIQUE: Multiplanar and multiecho pulse sequences of the lumbar spine were obtained without and with intravenous contrast. CONTRAST:  9mL GADAVIST  GADOBUTROL  1 MMOL/ML IV SOLN COMPARISON:  None Available. FINDINGS:  Segmentation:  Standard. Alignment: No substantial sagittal subluxation. Vertebrae: Right eccentric degenerative/discogenic endplate signal change at L4-L5. No specific evidence of acute fracture or discitis/osteomyelitis. No suspicious bone lesion. Conus medullaris and cauda equina: Conus extends to the L1-L2 level. Conus and cauda equina appear normal. Paraspinal and other soft tissues: Mild left hydronephrosis. Disc levels: T12-L1: No significant disc protrusion, foraminal stenosis, or canal stenosis. L1-L2: Left eccentric disc protrusion results in mild left foraminal stenosis. Patent canal and right foramen. L2-L3: Mild disc bulging. Mild facet arthropathy. No significant canal or foraminal stenosis L3-L4: Broad disc bulge with superimposed right subarticular disc protrusion which extends inferiorly. Bilateral facet arthropathy. Resulting moderate to severe right subarticular recess stenosis with potential impingement of descending right L4 nerve root. Mild to moderate canal stenosis. Mild right foraminal stenosis. L4-L5: Broad disc bulge with right subarticular disc protrusion. Bilateral facet arthropathy. Resulting moderate right subarticular recess stenosis with potential impingement of the descending right L5 nerve roots. Mild canal stenosis. Moderate right and mild-to-moderate left foraminal stenosis. L5-S1: Right eccentric disc bulge and bilateral facet arthropathy. Superiorly directed right subarticular disc protrusion. Patent  canal. Moderate left foraminal stenosis. IMPRESSION: 1. At L3-L4, right subarticular disc protrusion results in moderate to severe right subarticular recess stenosis with potential impingement of descending right L4 nerve root. Mild to moderate canal stenosis and mild right foraminal stenosis. 2. At L4-L5, right subarticular disc protrusion results in moderate right subarticular recess stenosis with potential impingement of descending right L5 nerve roots. Mild canal stenosis.  Moderate right and mild-to-moderate left foraminal stenosis. 3. At L5-S1, moderate left foraminal stenosis. 4. Mild left hydronephrosis, better characterized on recent CT Electronically Signed   By: Stevenson Elbe M.D.   On: 03/27/2024 21:38   CT Renal Stone Study Result Date: 03/27/2024 CLINICAL DATA:  Abdominal and flank pain. EXAM: CT ABDOMEN AND PELVIS WITHOUT CONTRAST TECHNIQUE: Multidetector CT imaging of the abdomen and pelvis was performed following the standard protocol without IV contrast. RADIATION DOSE REDUCTION: This exam was performed according to the departmental dose-optimization program which includes automated exposure control, adjustment of the mA and/or kV according to patient size and/or use of iterative reconstruction technique. COMPARISON:  CT abdomen and pelvis 03/01/2024. FINDINGS: Lower chest: No acute abnormality. Hepatobiliary: No focal liver abnormality is seen. No gallstones, gallbladder wall thickening, or biliary dilatation. Pancreas: Unremarkable. No pancreatic ductal dilatation or surrounding inflammatory changes. Spleen: Normal in size without focal abnormality. Adrenals/Urinary Tract: Patient is status post right nephrectomy and cystectomy. Left-sided urostomy present. A left ureteral stent is present. The proximal portion of the stent is coiled in the proximal left ureter. There is mild to moderate left-sided hydronephrosis with contrast seen in the left renal collecting system. There is mild left perinephric fat stranding. No contrast is seen distal to this point. The adrenal glands are within normal limits. Stomach/Bowel: Stomach is within normal limits. Appendix appears normal. No evidence of bowel wall thickening, distention, or inflammatory changes. Colonic diverticulosis is present. Vascular/Lymphatic: Aortic atherosclerosis. No enlarged abdominal or pelvic lymph nodes. Reproductive: Prostate gland is surgically absent. Other: There is a small amount of free fluid in  the pelvis similar to the prior study. There is no focal abdominal wall hernia. Musculoskeletal: Degenerative changes affect the spine. There severe degenerative changes of the right hip. IMPRESSION: 1. Left ureteral stent is coiled in the proximal left ureter. There is mild to moderate left-sided hydronephrosis with contrast seen in the left renal collecting system only. Obstructive process not excluded secondary to stent positioning. Recommend clinical correlation and follow-up. 2. Left-sided urostomy. 3. Status post right nephrectomy and cystectomy. 4. Stable small amount of free fluid in the pelvis. 5. Colonic diverticulosis. 6. Aortic atherosclerosis. Aortic Atherosclerosis (ICD10-I70.0). Electronically Signed   By: Tyron Gallon M.D.   On: 03/27/2024 19:09        Scheduled Meds:  acetaminophen   1,000 mg Oral TID   apixaban   5 mg Oral BID   atorvastatin   40 mg Oral QHS   dexamethasone  (DECADRON ) injection  4 mg Intravenous Q8H   insulin  aspart  0-9 Units Subcutaneous Q4H   lidocaine   1 patch Transdermal Q24H   metoprolol  succinate  25 mg Oral QHS   pregabalin  75 mg Oral Daily   Continuous Infusions:  sodium chloride  75 mL/hr at 03/28/24 1427   ampicillin (OMNIPEN) IV 2 g (03/29/24 0759)   cefTRIAXone  (ROCEPHIN )  IV 2 g (03/29/24 0907)     LOS: 1 day    Time spent: 40 minutes    Hilda Lovings, MD [facility]   To contact the attending provider between 7A-7P or the covering provider  during after hours 7P-7A, please log into the web site [email or web address] and access using universal [place] password for that web site. If you do not have the password, please call the hospital operator.  03/29/2024, 11:41 AM

## 2024-03-29 NOTE — Evaluation (Addendum)
 Physical Therapy Evaluation Patient Details Name: Robert [name] MRN: [id number] DOB: 1951/08/31 Today's Date: 03/29/2024  History of Present Illness  73 yo presents with severe low back pain with radiation to the R LE. IR also consulted for evaluation for epidural steroid injection however recommended outpatient referral on 6/5. Recently admitted for AKI from 03/01/2024 to 03/02/2024 with mild to moderate hydronephrosis with stent retracted to the mid ureter on imaging whereby patient underwent stent exchange per urology with improvement with renal function. PMH: paroxysmal A-fib on Eliquis , HTN, hyperlipidemia, type 2 diabetes, metastatic bladder cancer s/p cystoprostatectomy, total neurectomy and right nephroureterectomy and solitary left kidney s/p ureterostomy currently on immunotherapy for bladder cancer.  Clinical Impression  Pt admitted with above diagnosis. Pt reports being premedicated, reports 1/10 pain upon therapist's arrival and wanting to attempt standing. Max education on log rolling for comfort, therapist educating on hand placement with bedrails, pelvic positioning, head of bed positioning. Pt needing mod A for rolling to side and attempting to push up into sitting but unable to fully upright into sitting due to increased pain up to 10/10. Pt fighting against therapist due ot high pain, writhing in bed, reaching for bedrails and linen to support self due to "lightening" pain down RLE. Oncologist in room momentarily during eval also offering education and encouragement. Assisted pt back to supine with all needs in reach, pt frustrated with pain and inability to sit or stand; reports pain 3/10 when back in supine. At baseline, pt from single level home, is the caregiver for spouse providing supv due to pt's dementia. Pt reports ind with [facility] at baseline, chronic R hip pain, denies falls. Hopeful for pain management while in hospital and f/u with OPPT at d/c. Pt currently with functional  limitations due to the deficits listed below (see PT Problem List). Pt will benefit from acute skilled PT to increase their independence and safety with mobility to allow discharge.           If plan is discharge home, recommend the following: A little help with walking and/or transfers;A little help with bathing/dressing/bathroom;Assistance with cooking/housework;Assist for transportation;Help with stairs or ramp for entrance   Can travel by private vehicle        Equipment Recommendations None recommended by PT  Recommendations for Other Services       Functional Status Assessment Patient has had a recent decline in their functional status and demonstrates the ability to make significant improvements in function in a reasonable and predictable amount of time.     Precautions / Restrictions Precautions Precautions: Fall;Back Restrictions Weight Bearing Restrictions Per Provider Order: No      Mobility  Bed Mobility Overal bed mobility: Needs Assistance Bed Mobility: Rolling, Sidelying to Sit Rolling: Mod assist, Used rails Sidelying to sit: Mod assist, HOB elevated, Used rails       General bed mobility comments: max multimodal cues for log rolling for back comfort, increased pain wtih movement causing pt to reach for bedrails, writhing in bed, lots of redirection and assistance, mod A with HOB elevated and use of bedrail to come to semi sitting from sidelying position but unable to achieve full sitting EOB due to pain, cues to avoid breath holding; pt returns to supine, using bedcontrols to reposition legs and trunk, max relaxation cues without improvement in pain    Transfers                        Ambulation/Gait  Stairs            Wheelchair Mobility     Tilt Bed    Modified Rankin (Stroke Patients Only)       Balance                                             Pertinent Vitals/Pain Pain  Assessment Pain Assessment: 0-10 Pain Score: 10-Worst pain ever Pain Location: low back, right buttock down into feet Pain Descriptors / Indicators: Sharp, Shooting ("lightening") Pain Intervention(s): Limited activity within patient's tolerance, Monitored during session, Premedicated before session, Repositioned, Utilized relaxation techniques    Home Living Family/patient expects to be discharged to:: Private residence Living Arrangements: Spouse/significant other   Type of Home: House Home Access: Stairs to enter Entrance Stairs-Rails: Right (wall on opposite side) Entrance Stairs-Number of Steps: 3   Home Layout: One level Home Equipment: Cane - single point Additional Comments: pt is spouse's caregiver who has dementia, she is physically ind pt providing supv and memory cues    Prior Function Prior Level of Function : Independent/Modified Independent             Mobility Comments: pt reports ind with SPC, chronic R hip pain ADLs Comments: pt reports ind wiht [facility]     Extremity/Trunk Assessment   Upper Extremity Assessment Upper Extremity Assessment: Defer to OT evaluation    Lower Extremity Assessment Lower Extremity Assessment: LLE deficits/detail;RLE deficits/detail RLE Deficits / Details: heel slide with increased effort to ~45 deg, unable to complete SLR due to pain, ankle AROM WFL in s upine position RLE: Unable to fully assess due to pain RLE Sensation: history of peripheral neuropathy (pt reports) LLE Deficits / Details: AROM WFL in supine position LLE Sensation: history of peripheral neuropathy (pt reports)       Communication   Communication Communication: No apparent difficulties    Cognition Arousal: Alert Behavior During Therapy: WFL for tasks assessed/performed   PT - Cognitive impairments: No apparent impairments                       PT - Cognition Comments: pt pleasant, motivated Following commands: Intact       Cueing        General Comments      Exercises     Assessment/Plan    PT Assessment Patient needs continued PT services  PT Problem List Decreased strength;Decreased range of motion;Decreased activity tolerance;Decreased balance;Decreased mobility;Impaired sensation;Pain       PT Treatment Interventions DME instruction;Gait training;Stair training;Functional mobility training;Therapeutic activities;Therapeutic exercise;Balance training;Neuromuscular re-education;Cognitive remediation;Patient/family education;Manual techniques    PT Goals (Current goals can be found in the Care Plan section)  Acute Rehab PT Goals Patient Stated Goal: pain control PT Goal Formulation: With patient Time For Goal Achievement: 04/12/24 Potential to Achieve Goals: Good    Frequency Min 3X/week     Co-evaluation               AM-PAC PT "6 Clicks" Mobility  Outcome Measure Help needed turning from your back to your side while in a flat bed without using bedrails?: A Lot Help needed moving from lying on your back to sitting on the side of a flat bed without using bedrails?: A Lot Help needed moving to and from a bed to a chair (including a wheelchair)?: Total Help needed standing  up from a chair using your arms (e.g., wheelchair or bedside chair)?: Total Help needed to walk in hospital room?: Total Help needed climbing 3-5 steps with a railing? : Total 6 Click Score: 8    End of Session   Activity Tolerance: Patient limited by pain Patient left: in bed;with call bell/phone within reach;with bed alarm set Nurse Communication: Mobility status;Other (comment) (MD high pain) PT Visit Diagnosis: Other abnormalities of gait and mobility (R26.89);Pain Pain - Right/Left: Right Pain - part of body: Leg (back)    Time: 1010-1045 PT Time Calculation (min) (ACUTE ONLY): 35 min   Charges:   PT Evaluation $PT Eval Moderate Complexity: 1 Mod PT Treatments $Therapeutic Activity: 8-22 mins PT General  Charges $$ ACUTE PT VISIT: 1 Visit         Tori Avantae Bither PT, DPT 03/29/24, 11:03 AM

## 2024-03-29 NOTE — Plan of Care (Signed)
  Problem: Education: Goal: Knowledge of the procedure and recovery process will improve Outcome: Progressing   Problem: Pain Management: Goal: General experience of comfort will improve Outcome: Progressing   Problem: Urinary Elimination: Goal: Home care management will improve Outcome: Progressing   Problem: Education: Goal: Ability to describe self-care measures that may prevent or decrease complications (Diabetes Survival Skills Education) will improve Outcome: Progressing   Problem: Coping: Goal: Ability to adjust to condition or change in health will improve Outcome: Progressing   Problem: Fluid Volume: Goal: Ability to maintain a balanced intake and output will improve Outcome: Progressing   Problem: Metabolic: Goal: Ability to maintain appropriate glucose levels will improve Outcome: Progressing

## 2024-03-29 NOTE — Progress Notes (Signed)
 Robert [name]   DOB:[birth year]   UJ#:[id number]      ASSESSMENT & PLAN:  Robert [name] is a 73 year old male patient who follows with Medical Oncology Dr. Maryalice Smaller for bladder cancer.  He is on adjuvant therapy with Nivolumab .  He was admitted on 03/28/24 for complaints of severe low back pain.     Severe low back pain Difficulty ambulating -- Admitted on 6/5 with complaints of progressively increasing low back pain radiating to RLE. Patient rates as 10/10 -- IR consulted, [facility]  -- continue judicious pain management -- seen by ID and Urology -- Medicine managing  Bladder Cancer, recurrent -- Initially diagnosed 04/2021 -- status post BCG, status post Keytruda , stopped 03/2023 due to progression of disease -- Subsequently received chemotherapy with cisplatin  and gemcitabine  X 4 cycles.  Then underwent right robotic right nephroureterectomy, robotic radical cystectomy with total urethrectomy, bilateral pelvic lymph node dissection, and left cutaneous ureterostomy by Dr. Secundino Dach on 12/08/2023.. surgical path showed multifocal high-grade urothelial carcinoma, right renal pelvis, ureter, bladder, and prostatic urethra, surgical margines negative, 1 positive node -- On Nivolumab  every 4 weeks, started on 02/15/2024.  -- Medical Oncology/Dr. Maryalice Smaller following.  Recommend close outpatient oncology follow up upon discharge.  Next appointment is scheduled for 04/11/2024.  History of AKI Complicated UTI Bacteremia -- Stable at this time with creatinine 1.16 and BUN 23 -- Avoid nephrotoxic agents -- Continue antibiotics as ordered - ID recommends port removal, okay from oncology standpoint -- Continue to monitor renal function closely  Anemia -- Likely multifactorial due to malignancy, oncologic therapy, renal dysfunction -- HGB low 9.4, baseline appears to be in the 8-11 range -- No transfusional intervention at this time -- Continue to monitor CBC with  diff   Code Status Full  Subjective:  Patient seen awake and alert sitting up in bed.  Reports lower back pain as being 3-5 out of 10.  Happy that pain level has decreased, states it was 10 out of 10 when he came.  Physical therapy arrived and attempting to have patient's stand, at patient's request.  No other acute distress is noted.  Objective:   Intake/Output Summary (Last 24 hours) at 03/29/2024 0942 Last data filed at 03/29/2024 0535 Gross per 24 hour  Intake 2100.06 ml  Output 1800 ml  Net 300.06 ml     PHYSICAL EXAMINATION: ECOG PERFORMANCE STATUS: 3 - Symptomatic, >50% confined to bed  Vitals:   03/28/24 2313 03/29/24 0349  BP: (!) 104/52 (!) 118/58  Pulse: 60 77  Resp: 15 18  Temp: 98.3 F (36.8 C) 98.3 F (36.8 C)  SpO2: 98% 100%   Filed Weights   03/27/24 1327 03/27/24 1330  Weight: 200 lb (90.7 kg) 200 lb (90.7 kg)    GENERAL: alert, no distress and comfortable SKIN: + Pale skin color, texture, turgor are normal, no rashes or significant lesions EYES: normal, conjunctiva are pink and non-injected, sclera clear OROPHARYNX: no exudate, no erythema and lips, buccal mucosa, and tongue normal  NECK: supple, thyroid  normal size, non-tender, without nodularity LYMPH: no palpable lymphadenopathy in the cervical, axillary or inguinal LUNGS: clear to auscultation and percussion with normal breathing effort HEART: regular rate & rhythm and no murmurs and no lower extremity edema ABDOMEN: abdomen soft, non-tender and normal bowel sounds MUSCULOSKELETAL: no cyanosis of digits and no clubbing  PSYCH: alert & oriented x 3 with fluent speech NEURO: no focal motor/sensory deficits   All questions were answered.  The patient knows to call the clinic with any problems, questions or concerns.   The total time spent in the appointment was 40 minutes encounter with patient including review of chart and various tests results, discussions about plan of care and coordination of  care plan  Jacqualin Mate, NP 03/29/2024 9:42 AM    Labs Reviewed:  Lab Results  Component Value Date   WBC 11.5 (H) 03/29/2024   HGB 9.4 (L) 03/29/2024   HCT 28.9 (L) 03/29/2024   MCV 85.3 03/29/2024   PLT 158 03/29/2024   Recent Labs    02/15/24 1351 02/29/24 1138 03/01/24 1241 03/02/24 0508 03/02/24 1437 03/14/24 0917 03/27/24 1444 03/28/24 0320 03/29/24 0448  NA 137 139   < > 137   < > 135 130* 130* 134*  K 4.5 4.8   < > 4.1   < > 4.3 4.1 4.4 3.8  CL 105 107   < > 108   < > 104 98 99 105  CO2 26 21   < > 18*   < > 23 20* 18* 20*  GLUCOSE 179* 133*   < > 109*   < > 219* 172* 171* 146*  BUN 40* 77*   < > 62*   < > 28* 26* 21 23  CREATININE 1.62* 5.38*   < > 3.75*   < > 1.57* 1.54* 1.24 1.16  CALCIUM  9.5 9.3   < > 8.6*   < > 9.6 9.1 9.0 8.7*  GFRNONAA 45*  --    < > 16*   < > 46* 47* >60 >60  PROT 7.2 6.6  --   --   --  7.7 7.8  --   --   ALBUMIN 4.0  --   --  3.3*  --  4.2 3.5  --   --   AST 12* 7*  --   --   --  13* 19  --   --   ALT 14 9  --   --   --  12 13  --   --   ALKPHOS 83  --   --   --   --  96 81  --   --   BILITOT 0.4 0.4  --   --   --  0.4 0.9  --   --    < > = values in this interval not displayed.    Studies Reviewed:  MR Lumbar Spine W Wo Contrast Result Date: 03/27/2024 CLINICAL DATA:  Low back pain, cancer suspected Low back pain, cauda equina syndrome suspected. EXAM: MRI LUMBAR SPINE WITHOUT AND WITH CONTRAST TECHNIQUE: Multiplanar and multiecho pulse sequences of the lumbar spine were obtained without and with intravenous contrast. CONTRAST:  9mL GADAVIST  GADOBUTROL  1 MMOL/ML IV SOLN COMPARISON:  None Available. FINDINGS: Segmentation:  Standard. Alignment: No substantial sagittal subluxation. Vertebrae: Right eccentric degenerative/discogenic endplate signal change at L4-L5. No specific evidence of acute fracture or discitis/osteomyelitis. No suspicious bone lesion. Conus medullaris and cauda equina: Conus extends to the L1-L2 level. Conus and cauda  equina appear normal. Paraspinal and other soft tissues: Mild left hydronephrosis. Disc levels: T12-L1: No significant disc protrusion, foraminal stenosis, or canal stenosis. L1-L2: Left eccentric disc protrusion results in mild left foraminal stenosis. Patent canal and right foramen. L2-L3: Mild disc bulging. Mild facet arthropathy. No significant canal or foraminal stenosis L3-L4: Broad disc bulge with superimposed right subarticular disc protrusion which extends inferiorly. Bilateral facet arthropathy. Resulting moderate to severe right  subarticular recess stenosis with potential impingement of descending right L4 nerve root. Mild to moderate canal stenosis. Mild right foraminal stenosis. L4-L5: Broad disc bulge with right subarticular disc protrusion. Bilateral facet arthropathy. Resulting moderate right subarticular recess stenosis with potential impingement of the descending right L5 nerve roots. Mild canal stenosis. Moderate right and mild-to-moderate left foraminal stenosis. L5-S1: Right eccentric disc bulge and bilateral facet arthropathy. Superiorly directed right subarticular disc protrusion. Patent canal. Moderate left foraminal stenosis. IMPRESSION: 1. At L3-L4, right subarticular disc protrusion results in moderate to severe right subarticular recess stenosis with potential impingement of descending right L4 nerve root. Mild to moderate canal stenosis and mild right foraminal stenosis. 2. At L4-L5, right subarticular disc protrusion results in moderate right subarticular recess stenosis with potential impingement of descending right L5 nerve roots. Mild canal stenosis. Moderate right and mild-to-moderate left foraminal stenosis. 3. At L5-S1, moderate left foraminal stenosis. 4. Mild left hydronephrosis, better characterized on recent CT Electronically Signed   By: Stevenson Elbe M.D.   On: 03/27/2024 21:38   CT Renal Stone Study Result Date: 03/27/2024 CLINICAL DATA:  Abdominal and flank pain.  EXAM: CT ABDOMEN AND PELVIS WITHOUT CONTRAST TECHNIQUE: Multidetector CT imaging of the abdomen and pelvis was performed following the standard protocol without IV contrast. RADIATION DOSE REDUCTION: This exam was performed according to the departmental dose-optimization program which includes automated exposure control, adjustment of the mA and/or kV according to patient size and/or use of iterative reconstruction technique. COMPARISON:  CT abdomen and pelvis 03/01/2024. FINDINGS: Lower chest: No acute abnormality. Hepatobiliary: No focal liver abnormality is seen. No gallstones, gallbladder wall thickening, or biliary dilatation. Pancreas: Unremarkable. No pancreatic ductal dilatation or surrounding inflammatory changes. Spleen: Normal in size without focal abnormality. Adrenals/Urinary Tract: Patient is status post right nephrectomy and cystectomy. Left-sided urostomy present. A left ureteral stent is present. The proximal portion of the stent is coiled in the proximal left ureter. There is mild to moderate left-sided hydronephrosis with contrast seen in the left renal collecting system. There is mild left perinephric fat stranding. No contrast is seen distal to this point. The adrenal glands are within normal limits. Stomach/Bowel: Stomach is within normal limits. Appendix appears normal. No evidence of bowel wall thickening, distention, or inflammatory changes. Colonic diverticulosis is present. Vascular/Lymphatic: Aortic atherosclerosis. No enlarged abdominal or pelvic lymph nodes. Reproductive: Prostate gland is surgically absent. Other: There is a small amount of free fluid in the pelvis similar to the prior study. There is no focal abdominal wall hernia. Musculoskeletal: Degenerative changes affect the spine. There severe degenerative changes of the right hip. IMPRESSION: 1. Left ureteral stent is coiled in the proximal left ureter. There is mild to moderate left-sided hydronephrosis with contrast seen in  the left renal collecting system only. Obstructive process not excluded secondary to stent positioning. Recommend clinical correlation and follow-up. 2. Left-sided urostomy. 3. Status post right nephrectomy and cystectomy. 4. Stable small amount of free fluid in the pelvis. 5. Colonic diverticulosis. 6. Aortic atherosclerosis. Aortic Atherosclerosis (ICD10-I70.0). Electronically Signed   By: Tyron Gallon M.D.   On: 03/27/2024 19:09   DG Abd 1 View Result Date: 03/01/2024 CLINICAL DATA:  Left ureteral stent placement. EXAM: ABDOMEN - 1 VIEW COMPARISON:  February 05, 2024 FINDINGS: The bowel gas pattern is normal. A left-sided ureteral stent is seen. This is unchanged in position when compared to the prior study. No radio-opaque calculi or other significant radiographic abnormality are seen. Ill-defined surgical sutures are seen overlying the  pelvis. IMPRESSION: Left-sided ureteral stent, as described above. Electronically Signed   By: Virgle Grime M.D.   On: 03/01/2024 21:59   CT Renal Stone Study Result Date: 03/01/2024 CLINICAL DATA:  Bladder, prostate, and renal cell carcinoma. Cystoprostatectomy. Chills yesterday. * Tracking Code: BO * EXAM: CT ABDOMEN AND PELVIS WITHOUT CONTRAST TECHNIQUE: Multidetector CT imaging of the abdomen and pelvis was performed following the standard protocol without IV contrast. RADIATION DOSE REDUCTION: This exam was performed according to the departmental dose-optimization program which includes automated exposure control, adjustment of the mA and/or kV according to patient size and/or use of iterative reconstruction technique. COMPARISON:  02/04/2024 FINDINGS: Lower chest: Smooth septal thickening at the lung bases. Normal heart size. Right coronary artery calcification. Aortic valve calcification. Trace bilateral pleural thickening. Hepatobiliary: Normal liver. Normal gallbladder, without biliary ductal dilatation. Pancreas: Normal, without mass or ductal dilatation. Spleen:  Normal in size, without focal abnormality. Adrenals/Urinary Tract: Bilateral adrenal thickening is unchanged. Right nephrectomy. No left renal calculi. Mild left-sided hydroureteronephrosis again identified. Followed to the level of the left abdominal wall urinary diversion. Interval placement of a catheter or stent within distal most left ureter. Perinephric edema is moderate, increased. Status post cystectomy. Stomach/Bowel: Normal stomach, without wall thickening. Lipoma within the distal duodenum including at 11 mm without complicating obstruction. Scattered colonic diverticula. Normal terminal ileum. Normal appendix. Vascular/Lymphatic: Aortic atherosclerosis. No abdominopelvic adenopathy. Reproductive: Prostatectomy. Other: Trace pelvic fluid, minimally increased. No free intraperitoneal air. Musculoskeletal: Degenerative changes of both hips, greater on the right. Lumbosacral spondylosis. IMPRESSION: 1. Status post right nephrectomy and cystoprostatectomy. 2. Similar mild left-sided hydroureteronephrosis, without obstructive cause. Interval placement of a catheter or stent in the distal most ureter, just proximal to the urinary diversion in the left abdominal wall. 3. Increased left renal/perirenal edema which could all be due to hydronephrosis or superimposed pyelonephritis. 4. Increased small volume pelvic fluid. Development of bibasilar septal thickening. Findings suggest fluid overload or mild congestive heart failure. 5. Aortic valvular calcifications. Consider echocardiography to evaluate for valvular dysfunction. 6. Coronary artery atherosclerosis. Aortic Atherosclerosis (ICD10-I70.0). Electronically Signed   By: Lore Rode M.D.   On: 03/01/2024 14:00

## 2024-03-30 DIAGNOSIS — N1831 Chronic kidney disease, stage 3a: Secondary | ICD-10-CM | POA: Diagnosis not present

## 2024-03-30 DIAGNOSIS — R7881 Bacteremia: Secondary | ICD-10-CM | POA: Diagnosis not present

## 2024-03-30 DIAGNOSIS — M5416 Radiculopathy, lumbar region: Secondary | ICD-10-CM | POA: Diagnosis not present

## 2024-03-30 DIAGNOSIS — I480 Paroxysmal atrial fibrillation: Secondary | ICD-10-CM | POA: Diagnosis not present

## 2024-03-30 LAB — BASIC METABOLIC PANEL WITH GFR
Anion gap: 6 (ref 5–15)
BUN: 27 mg/dL — ABNORMAL HIGH (ref 8–23)
CO2: 23 mmol/L (ref 22–32)
Calcium: 8.2 mg/dL — ABNORMAL LOW (ref 8.9–10.3)
Chloride: 103 mmol/L (ref 98–111)
Creatinine, Ser: 1.3 mg/dL — ABNORMAL HIGH (ref 0.61–1.24)
GFR, Estimated: 58 mL/min — ABNORMAL LOW (ref >60)
Glucose, Bld: 237 mg/dL — ABNORMAL HIGH (ref 70–99)
Potassium: 4.6 mmol/L (ref 3.5–5.1)
Sodium: 132 mmol/L — ABNORMAL LOW (ref 135–145)

## 2024-03-30 LAB — GLUCOSE, CAPILLARY
Glucose-Capillary: 265 mg/dL — ABNORMAL HIGH (ref 70–99)
Glucose-Capillary: 317 mg/dL — ABNORMAL HIGH (ref 70–99)
Glucose-Capillary: 334 mg/dL — ABNORMAL HIGH (ref 70–99)
Glucose-Capillary: 197 mg/dL — ABNORMAL HIGH (ref 70–99)

## 2024-03-30 LAB — CULTURE, BLOOD (ROUTINE X 2): Special Requests: ADEQUATE

## 2024-03-30 LAB — CBC WITH DIFFERENTIAL/PLATELET
Abs Immature Granulocytes: 0.09 10*3/uL — ABNORMAL HIGH (ref 0.00–0.07)
Basophils Absolute: 0 10*3/uL (ref 0.0–0.1)
Basophils Relative: 0 %
Eosinophils Absolute: 0 10*3/uL (ref 0.0–0.5)
Eosinophils Relative: 0 %
HCT: 27.1 % — ABNORMAL LOW (ref 39.0–52.0)
Hemoglobin: 8.8 g/dL — ABNORMAL LOW (ref 13.0–17.0)
Immature Granulocytes: 1 %
Lymphocytes Relative: 11 %
Lymphs Abs: 1.1 10*3/uL (ref 0.7–4.0)
MCH: 27.4 pg (ref 26.0–34.0)
MCHC: 32.5 g/dL (ref 30.0–36.0)
MCV: 84.4 fL (ref 80.0–100.0)
Monocytes Absolute: 0.6 10*3/uL (ref 0.1–1.0)
Monocytes Relative: 6 %
Neutro Abs: 8.3 10*3/uL — ABNORMAL HIGH (ref 1.7–7.7)
Neutrophils Relative %: 82 %
Platelets: 151 10*3/uL (ref 150–400)
RBC: 3.21 MIL/uL — ABNORMAL LOW (ref 4.22–5.81)
RDW: 13.9 % (ref 11.5–15.5)
WBC: 10 10*3/uL (ref 4.0–10.5)
nRBC: 0 % (ref 0.0–0.2)

## 2024-03-30 LAB — MAGNESIUM: Magnesium: 2 mg/dL (ref 1.7–2.4)

## 2024-03-30 MED ORDER — INSULIN GLARGINE-YFGN 100 UNIT/ML ~~LOC~~ SOLN
10.0000 [IU] | Freq: Every day | SUBCUTANEOUS | Status: DC
  Administered 2024-03-30 – 2024-04-01 (×3): 10 [IU] via SUBCUTANEOUS
  Filled 2024-03-30 (×3): qty 0.1

## 2024-03-30 MED ORDER — INSULIN ASPART 100 UNIT/ML IJ SOLN
4.0000 [IU] | Freq: Three times a day (TID) | INTRAMUSCULAR | Status: DC
  Administered 2024-03-30 – 2024-03-31 (×3): 4 [IU] via SUBCUTANEOUS

## 2024-03-30 NOTE — Plan of Care (Signed)
  Problem: Pain Management: Goal: General experience of comfort will improve Outcome: Progressing   Problem: Education: Goal: Ability to describe self-care measures that may prevent or decrease complications (Diabetes Survival Skills Education) will improve Outcome: Progressing   Problem: Metabolic: Goal: Ability to maintain appropriate glucose levels will improve Outcome: Progressing   Problem: Nutritional: Goal: Maintenance of adequate nutrition will improve Outcome: Progressing Goal: Progress toward achieving an optimal weight will improve Outcome: Progressing   Problem: Education: Goal: Knowledge of General Education information will improve Description: Including pain rating scale, medication(s)/side effects and non-pharmacologic comfort measures Outcome: Progressing   Problem: Clinical Measurements: Goal: Will remain free from infection Outcome: Progressing Goal: Diagnostic test results will improve Outcome: Progressing   Problem: Activity: Goal: Risk for activity intolerance will decrease Outcome: Progressing   Problem: Nutrition: Goal: Adequate nutrition will be maintained Outcome: Progressing   Problem: Pain Managment: Goal: General experience of comfort will improve and/or be controlled Outcome: Progressing

## 2024-03-30 NOTE — Progress Notes (Signed)
 PROGRESS NOTE    Robert [name]  BJY:[id number] DOB: 07-26-1951 DOA: 03/27/2024 PCP: Genia Kettering, MD   Chief Complaint  Patient presents with   Back Pain    Brief Narrative:  Patient is a 73 year old gentleman with history of paroxysmal A-fib on Eliquis , hypertension, hyperlipidemia, type 2 diabetes, metastatic bladder cancer status post cystoprostatectomy, total neurectomy and right nephroureterectomy and solitary left kidney status post ureterostomy currently on immunotherapy for bladder cancer recently admitted for AKI from 03/01/2024 to 03/02/2024 with mild to moderate hydronephrosis with stent retracted to the mid ureter on imaging whereby patient underwent stent exchange per urology with improvement with renal function. Patient presenting to the ED with complaint of severe low back pain with radiation to the right lower extremity causing difficulty ambulating and standing.  Patient also noted to have a complicated UTI placed on IV antibiotics and pancultured.  Blood cultures positive for Enterococcus faecalis.  ID consulted.  IR also consulted for evaluation for epidural steroid injection however recommended outpatient referral.   Assessment & Plan:   Principal Problem:   Lumbar radiculopathy Active Problems:   Bacteremia   Bladder cancer (HCC)   Complicated UTI (urinary tract infection)   Paroxysmal A-fib (HCC)   Hyponatremia   CKD (chronic kidney disease), stage III (HCC)  #1 severe low back pain secondary to lumbar radiculopathy -Patient denied any bowel or urinary incontinence, no lower extremity weakness or sensory loss. - It is noted that ED physician discussed lumbar MRI results with on-call provider for neurosurgery who recommended consulting IR for epidural steroid injection. - Patient received IV Decadron  8 mg in the ED. - Eliquis  held. - IR consulted for epidural steroid injection and recommended outpatient referral which has been placed per IR. -  Patient with improvement with lower back pain and radiculopathy after being started on current pain regimen on 03/29/2024.   - Continue Lyrica .  - Continue scheduled Tylenol  1000 mg 3 times daily, IV Decadron  4 mg every 8 hours, Lidoderm  patch.   - IV Dilaudid  as needed for severe pain, oxycodone  as needed, Robaxin  as needed.   - Supportive care.   2.  Complicated UTI secondary to Enterococcus faecalis. -Urinalysis concerning for UTI. -Urine cultures with > 100,000 colonies of Enterococcus faecalis sensitive to ampicillin , Macrobid , vancomycin. -CT showed left ureteral stent called in the proximal left ureter with mild to moderate left-sided hydronephrosis.  Patient seen in consultation by urology who feel no acute urological intervention is indicated at this time however if patient becomes anuric, develops left flank pain or becomes febrile urology will need to be paged for consideration of stent exchange. -Will need outpatient follow-up with primary urologist. - Patient on IV Rocephin  and  IV ampicillin  due to bacteremia per ID.[name]  3.  Enterococcus faecalis bacteremia -Likely seeded from the urine. - Patient started on IV ampicillin . - Patient seen in consultation by ID who recommended discontinuation of IV cefepime  and placed patient on IV ampicillin  and IV Rocephin . 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   - ID recommended port removal, which was done per IR on 03/29/2024.   -Status post 1 dose of IV gentamicin  per ID due to history of Serratia.   -May need TEE, will defer to ID. - ID following and appreciate their input and  recommendations.   4.  Mild hyponatremia - Improved with hydration.   5.  CKD stage IIIa -Stable.  6.  Paroxysmal A-fib -Continue home regimen metoprolol  for rate  control. - Eliquis  for anticoagulation.  7.  Hypertension - Toprol -XL.   8.  Hyperlipidemia - Lipitor  9.  Type 2 diabetes mellitus -Hemoglobin A1c 7.1 on 02/13/2024. -CBG at 322 and patient on IV Decadron .  - Increase Semglee  to 10 units a day.  - Start meal coverage NovoLog  4 units 3 times daily with meals.  - SSI.    10.  Chronic anemia -Hemoglobin stable at 8.8.  11.  Metastatic bladder cancer -Status post cystoprostatectomy, total ureterectomy and right nephro ureterectomy and solitary left kidney status post ureterostomy. - Currently on treatment with immunotherapy. - Oncology following and in agreement and okay with Port-A-Cath removal as recommended per ID due to bacteremia.  - Port-A-Cath removed per IR on 03/29/2024.    DVT prophylaxis: Eliquis  Code Status: Full Family Communication: Updated patient.  No family at bedside. Disposition: TBD  Status is: Inpatient The patient will require care spanning > 2 midnights and should be moved to inpatient because: Severity of illness   Consultants:  Urology: Dr. Freddi Jaeger 03/28/2024 ID Dr. Zelda Hickman 03/28/2024 Oncology.  Procedures:  CT renal stone protocol 03/27/2024 2D echo 03/29/2024 Port-A-Cath explant per IR 03/29/2024  Antimicrobials:  Anti-infectives (From admission, onward)    Start     Dose/Rate Route Frequency Ordered Stop   03/28/24 1400  cefTRIAXone  (ROCEPHIN ) 2 g in sodium chloride  0.9 % 100 mL IVPB        2 g 200 mL/hr over 30 Minutes Intravenous Every 12 hours 03/28/24 1356     03/28/24 1400  gentamicin  (GARAMYCIN ) 450 mg in dextrose  5 % 50 mL IVPB        5 mg/kg  90.7 kg 122.5 mL/hr over 30 Minutes Intravenous  Once 03/28/24 1356 03/28/24 1650   03/28/24 1200  ampicillin  (OMNIPEN) 2 g in sodium chloride  0.9 % 100 mL IVPB        2 g 300 mL/hr over 20 Minutes Intravenous Every 4 hours 03/28/24 1015     03/28/24 1000  ampicillin  (OMNIPEN) 2 g in sodium chloride  0.9 % 100 mL IVPB  Status:  Discontinued        2 g 300  mL/hr over 20 Minutes Intravenous Every 4 hours 03/28/24 0952 03/28/24 1015   03/28/24 0600  ceFEPIme  (MAXIPIME ) 2 g in sodium chloride  0.9 % 100 mL IVPB  Status:  Discontinued        2 g 200 mL/hr over 30 Minutes Intravenous Every 8 hours 03/28/24 0330 03/28/24 1356   03/27/24 1715  cefTRIAXone  (ROCEPHIN ) 1 g in sodium chloride  0.9 % 100 mL IVPB        1 g 200 mL/hr over 30 Minutes Intravenous  Once 03/27/24 1701 03/27/24 1936         Subjective: Patient sitting up in chair.  States pain is significantly much better controlled on current regimen.  Stated this is the first time he is able to stand up in several days.  Denies any chest pain or shortness of breath.  No abdominal pain.  Stated Port-A-Cath was removed yesterday.  Friends at bedside.     Objective: Vitals:   03/29/24 0349 03/29/24 1318 03/29/24 1940 03/30/24 0510  BP: (!) 118/58 123/63 117/68 113/73  Pulse: 77 80 78 66  Resp: 18 18 20 18   Temp: 98.3 F (36.8 C) 98  F (36.7 C) 97.7 F (36.5 C) 98.8 F (37.1 C)  TempSrc:  Oral Oral   SpO2: 100% 98% 100% 97%  Weight:      Height:        Intake/Output Summary (Last 24 hours) at 03/30/2024 1125 Last data filed at 03/30/2024 1000 Gross per 24 hour  Intake 2333.85 ml  Output 2900 ml  Net -566.15 ml   Filed Weights   03/27/24 1327 03/27/24 1330  Weight: 90.7 kg 90.7 kg    Examination:  General exam: NAD.  Dressing over prior Port-A-Cath site.  Respiratory system: CTAB.  No wheezes, no crackles, no rhonchi.  Fair air movement.  Speaking in full sentences.  Cardiovascular system: RRR no murmurs rubs or gallops.  No JVD.  No pitting lower extremity edema.  Gastrointestinal system: Abdomen soft, nontender, nondistended, positive bowel sounds.  No rebound.  No guarding. Urostomy in place with clear yellow urine.  Central nervous system: Alert and oriented. No focal neurological deficits. Extremities: Symmetric 5 x 5 power. Skin: No rashes, lesions or ulcers Psychiatry:  Judgement and insight appear normal. Mood & affect appropriate.     Data Reviewed: I have personally reviewed following labs and imaging studies  CBC: Recent Labs  Lab 03/27/24 1444 03/28/24 0320 03/29/24 0448 03/30/24 0616  WBC 13.4* 12.0* 11.5* 10.0  NEUTROABS 10.4*  --  8.9* 8.3*  HGB 10.6* 10.2* 9.4* 8.8*  HCT 31.2* 30.8* 28.9* 27.1*  MCV 83.6 84.6 85.3 84.4  PLT 171 152 158 151    Basic Metabolic Panel: Recent Labs  Lab 03/27/24 1444 03/28/24 0320 03/29/24 0448 03/30/24 0616  NA 130* 130* 134* 132*  K 4.1 4.4 3.8 4.6  CL 98 99 105 103  CO2 20* 18* 20* 23  GLUCOSE 172* 171* 146* 237*  BUN 26* 21 23 27*  CREATININE 1.54* 1.24 1.16 1.30*  CALCIUM  9.1 9.0 8.7* 8.2*  MG  --   --  1.8 2.0    GFR: Estimated Creatinine Clearance: 60.5 mL/min (A) (by C-G formula based on SCr of 1.3 mg/dL (H)).  Liver Function Tests: Recent Labs  Lab 03/27/24 1444  AST 19  ALT 13  ALKPHOS 81  BILITOT 0.9  PROT 7.8  ALBUMIN 3.5    CBG: Recent Labs  Lab 03/29/24 0724 03/29/24 1123 03/29/24 1655 03/29/24 2048 03/30/24 0739  GLUCAP 122* 282* 297* 322* 265*     Recent Results (from the past 240 hours)  Urine Culture (for pregnant, neutropenic or urologic patients or patients with an indwelling urinary catheter)     Status: Abnormal   Collection Time: 03/27/24  2:06 PM   Specimen: Urine, Clean Catch  Result Value Ref Range Status   Specimen Description   Final    URINE, CLEAN CATCH Performed at [facility], [address], [place], [place] [address]    Special Requests   Final    NONE Performed at [facility], [address], [place], [place] [address]    Culture (A)  Final    >=100,000 COLONIES/mL ENTEROCOCCUS FAECALIS WITHIN MIXED ORGANISMS Performed at [facility], [address] [place], [place] [address]    Report Status 03/29/2024 FINAL  Final   Organism ID, Bacteria ENTEROCOCCUS FAECALIS (A)  Final       Susceptibility   Enterococcus faecalis - MIC*    AMPICILLIN  <=2 SENSITIVE Sensitive     NITROFURANTOIN  <=16 SENSITIVE Sensitive     VANCOMYCIN 2 SENSITIVE Sensitive     * >=  100,000 COLONIES/mL ENTEROCOCCUS FAECALIS  Blood culture (routine x 2)     Status: Abnormal   Collection Time: 03/27/24  2:44 PM   Specimen: BLOOD LEFT ARM  Result Value Ref Range Status   Specimen Description   Final    BLOOD LEFT ARM Performed at [facility], [address], [place], [place] [address]    Special Requests   Final    BOTTLES DRAWN AEROBIC AND ANAEROBIC Blood Culture adequate volume Performed at [facility], [address], [place], [place] [address]    Culture  Setup Time   Final    GRAM POSITIVE COCCI IN CHAINS AEROBIC BOTTLE ONLY CRITICAL RESULT CALLED TO, READ BACK BY AND VERIFIED WITH: PHARMD Annmarie Basket on [id number] @0929  by SM Performed at [facility], [address], [place], [place] [address]    Culture ENTEROCOCCUS FAECALIS (A)  Final   Report Status 03/30/2024 FINAL  Final   Organism ID, Bacteria ENTEROCOCCUS FAECALIS  Final      Susceptibility   Enterococcus faecalis - MIC*    AMPICILLIN  <=2 SENSITIVE Sensitive     VANCOMYCIN 2 SENSITIVE Sensitive     GENTAMICIN  SYNERGY SENSITIVE Sensitive     * ENTEROCOCCUS FAECALIS  Blood culture (routine x 2)     Status: Abnormal   Collection Time: 03/27/24  2:44 PM   Specimen: BLOOD RIGHT ARM  Result Value Ref Range Status   Specimen Description   Final    BLOOD RIGHT ARM Performed at [facility], [address] [place], [place] [address]    Special Requests   Final    BOTTLES DRAWN AEROBIC AND ANAEROBIC Blood Culture results may not be optimal due to an inadequate volume of blood received in culture bottles Performed at [facility], [address], [place], [place] [address]    Culture  Setup Time   Final    GRAM POSITIVE COCCI IN BOTH AEROBIC AND ANAEROBIC BOTTLES CRITICAL  VALUE NOTED.  VALUE IS CONSISTENT WITH PREVIOUSLY REPORTED AND CALLED VALUE.    Culture (A)  Final    ENTEROCOCCUS FAECALIS SUSCEPTIBILITIES PERFORMED ON PREVIOUS CULTURE WITHIN THE LAST 5 DAYS. Performed at [facility], [address], [place], [place] [address]    Report Status 03/30/2024 FINAL  Final  Blood Culture ID Panel (Reflexed)     Status: Abnormal   Collection Time: 03/27/24  2:44 PM  Result Value Ref Range Status   Enterococcus faecalis DETECTED (A) NOT DETECTED Final    Comment: CRITICAL RESULT CALLED TO, READ BACK BY AND VERIFIED WITH: PHARMD Justin L on [id number] @0929  by SM    Enterococcus Faecium NOT DETECTED NOT DETECTED Final   Listeria monocytogenes NOT DETECTED NOT DETECTED Final   Staphylococcus species NOT DETECTED NOT DETECTED Final   Staphylococcus aureus (BCID) NOT DETECTED NOT DETECTED Final   Staphylococcus epidermidis NOT DETECTED NOT DETECTED Final   Staphylococcus lugdunensis NOT DETECTED NOT DETECTED Final   Streptococcus species NOT DETECTED NOT DETECTED Final   Streptococcus agalactiae NOT DETECTED NOT DETECTED Final   Streptococcus pneumoniae NOT DETECTED NOT DETECTED Final   Streptococcus pyogenes NOT DETECTED NOT DETECTED Final   A.calcoaceticus-baumannii NOT DETECTED NOT DETECTED Final   Bacteroides fragilis NOT DETECTED NOT DETECTED Final   Enterobacterales NOT DETECTED NOT DETECTED Final   Enterobacter cloacae complex NOT DETECTED NOT DETECTED Final   Escherichia coli NOT DETECTED NOT DETECTED Final   Klebsiella aerogenes NOT DETECTED NOT  DETECTED Final   Klebsiella oxytoca NOT DETECTED NOT DETECTED Final   Klebsiella pneumoniae NOT DETECTED NOT DETECTED Final   Proteus species NOT DETECTED NOT DETECTED Final   Salmonella species NOT DETECTED NOT DETECTED Final   Serratia marcescens NOT DETECTED NOT DETECTED Final   Haemophilus influenzae NOT DETECTED NOT DETECTED Final   Neisseria meningitidis NOT DETECTED NOT DETECTED Final    Pseudomonas aeruginosa NOT DETECTED NOT DETECTED Final   Stenotrophomonas maltophilia NOT DETECTED NOT DETECTED Final   Candida albicans NOT DETECTED NOT DETECTED Final   Candida auris NOT DETECTED NOT DETECTED Final   Candida glabrata NOT DETECTED NOT DETECTED Final   Candida krusei NOT DETECTED NOT DETECTED Final   Candida parapsilosis NOT DETECTED NOT DETECTED Final   Candida tropicalis NOT DETECTED NOT DETECTED Final   Cryptococcus neoformans/gattii NOT DETECTED NOT DETECTED Final   Vancomycin resistance NOT DETECTED NOT DETECTED Final    Comment: Performed at [facility], [address] [place], [place] [address]  Culture, blood (Routine X 2) w Reflex to ID Panel     Status: None (Preliminary result)   Collection Time: 03/28/24  4:15 PM   Specimen: BLOOD  Result Value Ref Range Status   Specimen Description   Final    BLOOD SITE NOT SPECIFIED Performed at [facility], [address] [place], [place] [address]    Special Requests   Final    BOTTLES DRAWN AEROBIC AND ANAEROBIC Blood Culture adequate volume Performed at [facility], [address] [place], [place] [address]    Culture   Final    NO GROWTH 2 DAYS Performed at [facility], [address], [place], [place] [address]    Report Status PENDING  Incomplete  Culture, blood (Routine X 2) w Reflex to ID Panel     Status: None (Preliminary result)   Collection Time: 03/28/24  4:24 PM   Specimen: BLOOD  Result Value Ref Range Status   Specimen Description   Final    BLOOD SITE NOT SPECIFIED Performed at [facility], [address] [place], [place] [address]    Special Requests   Final    BOTTLES DRAWN AEROBIC AND ANAEROBIC Blood Culture adequate volume Performed at [facility], [address], [place], [place] [address]    Culture   Final    NO GROWTH 2 DAYS Performed at [facility], [address] [place], [place] [address]    Report  Status PENDING  Incomplete         Radiology Studies: ECHOCARDIOGRAM COMPLETE Result Date: 03/29/2024    ECHOCARDIOGRAM REPORT   Patient Name:   JUERGEN HARDENBROOK II Date of Exam: 03/29/2024 Medical Rec #:  [id number]             Height:       75.0 in Accession #:    [id number]            Weight:       200.0 lb Date of Birth:  1950-12-13            BSA:          2.194 m Patient Age:    72 years              BP:           118/58 mmHg Patient Gender: M  HR:           75 bpm. Exam Location:  Inpatient Procedure: 2D Echo, Cardiac Doppler and Color Doppler (Both Spectral and Color            Flow Doppler were utilized during procedure). Indications:    Endocarditis I38  History:        Patient has prior history of Echocardiogram examinations, most                 recent 11/03/2023.  Sonographer:    Hersey Lorenzo RDCS Referring Phys: [id number] [facility] IMPRESSIONS  1. Overall poor quality study for vegetation assessment.  2. Left ventricular ejection fraction, by estimation, is 65 to 70%. The left ventricle has normal function. The left ventricle has no regional wall motion abnormalities. There is mild left ventricular hypertrophy. Left ventricular diastolic parameters are indeterminate.  3. Right ventricular systolic function is normal. The right ventricular size is normal.  4. Left atrial size was mildly dilated.  5. 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 The mitral valve is abnormal. Trivial mitral valve regurgitation. No evidence of mitral stenosis. Severe mitral annular calcification.  6. The aortic valve was not well visualized. There is moderate calcification of the aortic valve. There is moderate thickening of the aortic valve. Aortic valve regurgitation is trivial. Aortic valve sclerosis is present, with no evidence of aortic valve stenosis.  7. Aortic dilatation noted. There is mild dilatation of the aortic root, measuring 39 mm.  8. The inferior  vena cava is dilated in size with >50% respiratory variability, suggesting right atrial pressure of 8 mmHg. FINDINGS  Left Ventricle: Left ventricular ejection fraction, by estimation, is 65 to 70%. The left ventricle has normal function. The left ventricle has no regional wall motion abnormalities. Strain was performed and the global longitudinal strain is indeterminate. The left ventricular internal cavity size was normal in size. There is mild left ventricular hypertrophy. Left ventricular diastolic parameters are indeterminate. Right Ventricle: The right ventricular size is normal. No increase in right ventricular wall thickness. Right ventricular systolic function is normal. Left Atrium: Left atrial size was mildly dilated. Right Atrium: Right atrial size was normal in size. Pericardium: There is no evidence of pericardial effusion. 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 The mitral valve is abnormal. There is mild thickening of the mitral valve leaflet(s). There is mild calcification of the mitral valve leaflet(s). Severe mitral annular calcification. Trivial mitral valve regurgitation. No evidence of mitral valve stenosis. Tricuspid Valve: The tricuspid valve is normal in structure. Tricuspid valve regurgitation is trivial. No evidence of tricuspid stenosis. Aortic Valve: The aortic valve was not well visualized. There is moderate calcification of the aortic valve. There is moderate thickening of the aortic valve. Aortic valve regurgitation is trivial. Aortic valve sclerosis is present, with no evidence of aortic valve stenosis. Pulmonic Valve: The pulmonic valve was not well visualized. Pulmonic valve regurgitation is not visualized. No evidence of pulmonic stenosis. Aorta: Aortic dilatation noted. There is mild dilatation of the aortic root, measuring 39 mm. Venous: The inferior vena cava is dilated in size with greater than 50% respiratory variability,  suggesting right atrial pressure of 8 mmHg. IAS/Shunts: No atrial level shunt detected by color flow Doppler. Additional Comments: Overall poor quality study for vegetation assessment. 3D was performed not requiring image post processing on an independent workstation and was indeterminate.  LEFT VENTRICLE PLAX 2D LVIDd:  5.60 cm   Diastology LVIDs:         3.90 cm   LV e' medial:    5.44 cm/s LV PW:         1.20 cm   LV E/e' medial:  27.6 LV IVS:        1.20 cm   LV e' lateral:   8.27 cm/s LVOT diam:     2.30 cm   LV E/e' lateral: 18.1 LV SV:         96 LV SV Index:   44 LVOT Area:     4.15 cm  RIGHT VENTRICLE             IVC RV S prime:     15.30 cm/s  IVC diam: 2.60 cm TAPSE (M-mode): 2.7 cm LEFT ATRIUM            Index LA diam:      4.10 cm  1.87 cm/m LA Vol (A2C): 78.1 ml  35.59 ml/m LA Vol (A4C): 104.0 ml 47.39 ml/m  AORTIC VALVE LVOT Vmax:   117.00 cm/s LVOT Vmean:  75.000 cm/s LVOT VTI:    0.231 m  AORTA Ao Root diam: 3.90 cm Ao Asc diam:  3.50 cm MITRAL VALVE MV Area (PHT): 3.08 cm     SHUNTS MV Decel Time: 246 msec     Systemic VTI:  0.23 m MV E velocity: 150.00 cm/s  Systemic Diam: 2.30 cm MV A velocity: 107.00 cm/s MV E/A ratio:  1.40 Janelle Mediate MD Electronically signed by Janelle Mediate MD Signature Date/Time: 03/29/2024/4:30:20 PM    Final    IR REMOVAL TUN ACCESS W/ PORT W/O FL MOD SED Result Date: 03/29/2024 INDICATION: 73 year old with bacteremia.  Request for Port-A-Cath removal. EXAM: REMOVAL OF RIGHT CHEST PORT-A-CATH MEDICATIONS: 1% lidocaine  for local anesthetic ANESTHESIA/SEDATION: None FLUOROSCOPY: None COMPLICATIONS: None immediate. PROCEDURE: Informed written consent was obtained from the patient after a thorough discussion of the procedural risks, benefits and alternatives. All questions were addressed. Maximal Sterile Barrier Technique was utilized including caps, mask, sterile gowns, sterile gloves, sterile drape, hand hygiene and skin antiseptic. A timeout was performed prior  to the initiation of the procedure. The right chest was prepped and draped in a sterile fashion. 1% lidocaine  was utilized for local anesthesia. An incision was made over the previously healed surgical incision. Utilizing blunt dissection, the port catheter and reservoir were removed from the underlying subcutaneous tissue in their entirety. The pocket was irrigated with a copious amount of sterile normal saline. The subcutaneous tissue was closed with 3-0 Vicryl interrupted subcutaneous stitches. A 4-0 Vicryl running subcuticular stitch was utilized to approximate the skin. Dermabond was applied. FINDINGS: Port skin site looked normal and healthy. Port pocket was examined after the port was removed. Port pocket looked healthy. Port pocket was closed without complication. IMPRESSION: Successful removal of right chest Port-A-Cath. Electronically Signed   By: Elene Griffes M.D.   On: 03/29/2024 16:16        Scheduled Meds:  acetaminophen   1,000 mg Oral TID   apixaban   5 mg Oral BID   atorvastatin   40 mg Oral QHS   dexamethasone  (DECADRON ) injection  4 mg Intravenous Q8H   insulin  aspart  0-15 Units Subcutaneous TID WC   insulin  aspart  0-5 Units Subcutaneous QHS   insulin  glargine-yfgn  10 Units Subcutaneous Daily   lidocaine   1 patch Transdermal Q24H   lidocaine -EPINEPHrine   20 mL Intradermal Once   metoprolol  succinate  25  mg Oral QHS   polyethylene glycol  17 g Oral Daily   pregabalin   75 mg Oral Daily   senna-docusate  1 tablet Oral BID   Continuous Infusions:  sodium chloride  75 mL/hr at 03/28/24 1427   ampicillin  (OMNIPEN) IV 2 g (03/30/24 1114)   cefTRIAXone  (ROCEPHIN )  IV 2 g (03/30/24 0944)     LOS: 2 days    Time spent: 40 minutes    Hilda Lovings, MD [facility]   To contact the attending provider between 7A-7P or the covering provider during after hours 7P-7A, please log into the web site [email or web address] and access using universal [place] password for that web  site. If you do not have the password, please call the hospital operator.  03/30/2024, 11:25 AM

## 2024-03-30 NOTE — Evaluation (Signed)
 Occupational Therapy Evaluation Patient Details Name: Robert [name] MRN: [id number] DOB: September 28, 1951 Today's Date: 03/30/2024   History of Present Illness   73 yo presents with severe low back pain with radiation to the R LE. IR also consulted for evaluation for epidural steroid injection however recommended outpatient referral on 6/5. Recently admitted for AKI from 03/01/2024 to 03/02/2024 with mild to moderate hydronephrosis with stent retracted to the mid ureter on imaging whereby patient underwent stent exchange per urology with improvement with renal function. PMH: paroxysmal A-fib on Eliquis , HTN, hyperlipidemia, type 2 diabetes, metastatic bladder cancer s/p cystoprostatectomy, total neurectomy and right nephroureterectomy and solitary left kidney s/p ureterostomy currently on immunotherapy for bladder cancer.     Clinical Impressions Patient is a 73 year old male who was admitted for above. Patient was living at home while caring for wife who has dementia. Currently, patient has pain limiting progress. Patient was min A to transfer from EOB to recliner with cues for proper use of RW. Patient was noted to have decreased functional activity tolerance, decreased endurance, decreased standing balance, decreased safety awareness, and decreased knowledge of AD/AE impacting participation in ADLs. Patient will benefit from continued inpatient follow up therapy, <3 hours/day       If plan is discharge home, recommend the following:   A lot of help with bathing/dressing/bathroom;Assistance with cooking/housework;Direct supervision/assist for medications management;Assist for transportation;Help with stairs or ramp for entrance;Direct supervision/assist for financial management;A lot of help with walking and/or transfers     Functional Status Assessment   Patient has had a recent decline in their functional status and demonstrates the ability to make significant improvements in function in  a reasonable and predictable amount of time.     Equipment Recommendations   None recommended by OT      Precautions/Restrictions   Precautions Precautions: Fall;Back Precaution/Restrictions Comments: urostomy Restrictions Weight Bearing Restrictions Per Provider Order: No     Mobility Bed Mobility Overal bed mobility: Needs Assistance   Rolling: Contact guard assist, Used rails         General bed mobility comments: cues for back precautions with patient able to complete with increased time.            Balance Overall balance assessment: Needs assistance   Sitting balance-Leahy Scale: Fair     Standing balance support: Reliant on assistive device for balance, Bilateral upper extremity supported Standing balance-Leahy Scale: Poor     ADL either performed or assessed with clinical judgement   ADL Overall ADL's : Needs assistance/impaired Eating/Feeding: Modified independent;Sitting   Grooming: Sitting;Set up   Upper Body Bathing: Sitting;Set up   Lower Body Bathing: Sitting/lateral leans;Maximal assistance   Upper Body Dressing : Sitting;Set up   Lower Body Dressing: Sitting/lateral leans;Maximal assistance Lower Body Dressing Details (indicate cue type and reason): unable to bring legs to lap with increased pain in RLE Toilet Transfer: Moderate assistance;Stand-pivot;Rolling walker (2 wheels) Toilet Transfer Details (indicate cue type and reason): to recliner in room with increased time and cues for positioning. patient reported having increased pain with transition into recliner even with height of seat built up. Toileting- Clothing Manipulation and Hygiene: Sitting/lateral lean;Maximal assistance               Vision Baseline Vision/History: 1 Wears glasses              Pertinent Vitals/Pain Pain Assessment Pain Assessment: Faces Faces Pain Scale: Hurts even more Pain Location: low back, right buttock down into  feet Pain Descriptors  / Indicators: Sharp, Shooting Pain Intervention(s): Limited activity within patient's tolerance, Monitored during session, Repositioned, Premedicated before session     Extremity/Trunk Assessment Upper Extremity Assessment Upper Extremity Assessment: Overall WFL for tasks assessed       Cervical / Trunk Assessment Cervical / Trunk Assessment: Normal      Cognition Arousal: Alert Behavior During Therapy: WFL for tasks assessed/performed Cognition: No apparent impairments       Following commands: Intact                  Home Living Family/patient expects to be discharged to:: Private residence Living Arrangements: Spouse/significant other Available Help at Discharge: Family;Available PRN/intermittently Type of Home: House Home Access: Stairs to enter [employer] of Steps: 3 Entrance Stairs-Rails: Right Home Layout: One level     Bathroom Shower/Tub: Walk-in shower         Home Equipment: Cane - single point   Additional Comments: pt is spouse's caregiver who has dementia, she is physically ind pt providing supv and memory cues      Prior Functioning/Environment Prior Level of Function : Independent/Modified Independent             Mobility Comments: pt reports ind with SPC, chronic R hip pain ADLs Comments: pt reports ind with SPC    OT Problem List: Impaired balance (sitting and/or standing);Decreased knowledge of precautions;Decreased safety awareness;Decreased coordination;Decreased knowledge of use of DME or AE;Decreased activity tolerance;Pain   OT Treatment/Interventions:        OT Goals(Current goals can be found in the care plan section)   Acute Rehab OT Goals Patient Stated Goal: to get pain under control OT Goal Formulation: With patient Time For Goal Achievement: 04/13/24 Potential to Achieve Goals: Fair   OT Frequency:          AM-PAC OT "6 Clicks" Daily Activity     Outcome Measure Help from another person eating  meals?: A Little Help from another person taking care of personal grooming?: A Little Help from another person toileting, which includes using toliet, bedpan, or urinal?: A Lot Help from another person bathing (including washing, rinsing, drying)?: A Lot Help from another person to put on and taking off regular upper body clothing?: A Little Help from another person to put on and taking off regular lower body clothing?: A Lot 6 Click Score: 15   End of Session Equipment Utilized During Treatment: Gait belt;Rolling walker (2 wheels) Nurse Communication: Mobility status  Activity Tolerance: Patient tolerated treatment well Patient left: in chair;with call bell/phone within reach;with chair alarm set  OT Visit Diagnosis: Unsteadiness on feet (R26.81);Other abnormalities of gait and mobility (R26.89);Muscle weakness (generalized) (M62.81);Pain Pain - Right/Left: Right Pain - part of body: Hip                Time: 1042-1101 OT Time Calculation (min): 19 min Charges:  OT General Charges $OT Visit: 1 Visit OT Evaluation $OT Eval Low Complexity: 1 Low  Kaylor Maiers OTR/L, MS Acute Rehabilitation Department Office# [phone]   Jame Maze 03/30/2024, 12:29 PM

## 2024-03-31 ENCOUNTER — Encounter: Payer: Self-pay | Admitting: Internal Medicine

## 2024-03-31 DIAGNOSIS — M5416 Radiculopathy, lumbar region: Secondary | ICD-10-CM | POA: Diagnosis not present

## 2024-03-31 DIAGNOSIS — I480 Paroxysmal atrial fibrillation: Secondary | ICD-10-CM | POA: Diagnosis not present

## 2024-03-31 DIAGNOSIS — R7881 Bacteremia: Secondary | ICD-10-CM | POA: Diagnosis not present

## 2024-03-31 DIAGNOSIS — N1831 Chronic kidney disease, stage 3a: Secondary | ICD-10-CM | POA: Diagnosis not present

## 2024-03-31 LAB — BASIC METABOLIC PANEL WITH GFR
Anion gap: 7 (ref 5–15)
BUN: 29 mg/dL — ABNORMAL HIGH (ref 8–23)
CO2: 22 mmol/L (ref 22–32)
Calcium: 8.1 mg/dL — ABNORMAL LOW (ref 8.9–10.3)
Chloride: 105 mmol/L (ref 98–111)
Creatinine, Ser: 1.21 mg/dL (ref 0.61–1.24)
GFR, Estimated: >60 mL/min (ref >60)
Glucose, Bld: 213 mg/dL — ABNORMAL HIGH (ref 70–99)
Potassium: 4.9 mmol/L (ref 3.5–5.1)
Sodium: 134 mmol/L — ABNORMAL LOW (ref 135–145)

## 2024-03-31 LAB — GLUCOSE, CAPILLARY
Glucose-Capillary: 203 mg/dL — ABNORMAL HIGH (ref 70–99)
Glucose-Capillary: 330 mg/dL — ABNORMAL HIGH (ref 70–99)
Glucose-Capillary: 201 mg/dL — ABNORMAL HIGH (ref 70–99)
Glucose-Capillary: 177 mg/dL — ABNORMAL HIGH (ref 70–99)

## 2024-03-31 LAB — CBC
HCT: 28.3 % — ABNORMAL LOW (ref 39.0–52.0)
Hemoglobin: 9.1 g/dL — ABNORMAL LOW (ref 13.0–17.0)
MCH: 27.7 pg (ref 26.0–34.0)
MCHC: 32.2 g/dL (ref 30.0–36.0)
MCV: 86.3 fL (ref 80.0–100.0)
Platelets: 153 10*3/uL (ref 150–400)
RBC: 3.28 MIL/uL — ABNORMAL LOW (ref 4.22–5.81)
RDW: 14.3 % (ref 11.5–15.5)
WBC: 8.9 10*3/uL (ref 4.0–10.5)
nRBC: 0 % (ref 0.0–0.2)

## 2024-03-31 MED ORDER — INSULIN ASPART 100 UNIT/ML IJ SOLN
6.0000 [IU] | Freq: Three times a day (TID) | INTRAMUSCULAR | Status: DC
  Administered 2024-03-31 – 2024-04-03 (×7): 6 [IU] via SUBCUTANEOUS

## 2024-03-31 MED ORDER — SODIUM CHLORIDE 0.9 % IV SOLN: INTRAVENOUS | Status: AC

## 2024-03-31 NOTE — Plan of Care (Signed)
  Problem: Education: Goal: Knowledge of the procedure and recovery process will improve Outcome: Progressing   Problem: Pain Management: Goal: General experience of comfort will improve Outcome: Progressing   Problem: Skin Integrity: Goal: Demonstration of wound healing without infection will improve Outcome: Progressing   Problem: Urinary Elimination: Goal: Ability to avoid or minimize complications of infection will improve Outcome: Progressing   Problem: Coping: Goal: Ability to adjust to condition or change in health will improve Outcome: Progressing

## 2024-03-31 NOTE — Plan of Care (Signed)
  Problem: Education: Goal: Knowledge of the procedure and recovery process will improve Outcome: Progressing   Problem: Bowel/Gastric: Goal: Gastrointestinal status for postoperative course will improve Outcome: Progressing   Problem: Pain Management: Goal: General experience of comfort will improve Outcome: Progressing   Problem: Skin Integrity: Goal: Demonstration of wound healing without infection will improve Outcome: Progressing   Problem: Urinary Elimination: Goal: Ability to avoid or minimize complications of infection will improve Outcome: Progressing Goal: Ability to achieve and maintain urine output will improve Outcome: Progressing Goal: Home care management will improve Outcome: Progressing   Problem: Education: Goal: Ability to describe self-care measures that may prevent or decrease complications (Diabetes Survival Skills Education) will improve Outcome: Progressing Goal: Individualized Educational Video(s) Outcome: Progressing   Problem: Coping: Goal: Ability to adjust to condition or change in health will improve Outcome: Progressing   Problem: Fluid Volume: Goal: Ability to maintain a balanced intake and output will improve Outcome: Progressing   Problem: Health Behavior/Discharge Planning: Goal: Ability to identify and utilize available resources and services will improve Outcome: Progressing Goal: Ability to manage health-related needs will improve Outcome: Progressing   Problem: Metabolic: Goal: Ability to maintain appropriate glucose levels will improve Outcome: Progressing   Problem: Nutritional: Goal: Maintenance of adequate nutrition will improve Outcome: Progressing Goal: Progress toward achieving an optimal weight will improve Outcome: Progressing   Problem: Skin Integrity: Goal: Risk for impaired skin integrity will decrease Outcome: Progressing   Problem: Tissue Perfusion: Goal: Adequacy of tissue perfusion will improve Outcome:  Progressing   Problem: Education: Goal: Knowledge of General Education information will improve Description: Including pain rating scale, medication(s)/side effects and non-pharmacologic comfort measures Outcome: Progressing   Problem: Health Behavior/Discharge Planning: Goal: Ability to manage health-related needs will improve Outcome: Progressing   Problem: Clinical Measurements: Goal: Ability to maintain clinical measurements within normal limits will improve Outcome: Progressing Goal: Will remain free from infection Outcome: Progressing Goal: Diagnostic test results will improve Outcome: Progressing Goal: Respiratory complications will improve Outcome: Progressing Goal: Cardiovascular complication will be avoided Outcome: Progressing   Problem: Activity: Goal: Risk for activity intolerance will decrease Outcome: Progressing   Problem: Nutrition: Goal: Adequate nutrition will be maintained Outcome: Progressing   Problem: Coping: Goal: Level of anxiety will decrease Outcome: Progressing   Problem: Elimination: Goal: Will not experience complications related to bowel motility Outcome: Progressing Goal: Will not experience complications related to urinary retention Outcome: Progressing   Problem: Pain Managment: Goal: General experience of comfort will improve and/or be controlled Outcome: Progressing   Problem: Safety: Goal: Ability to remain free from injury will improve Outcome: Progressing   Problem: Skin Integrity: Goal: Risk for impaired skin integrity will decrease Outcome: Progressing

## 2024-03-31 NOTE — Progress Notes (Addendum)
 PROGRESS NOTE    Robert [name]  EXB:[id number] DOB: 07-21-1951 DOA: 03/27/2024 PCP: Genia Kettering, MD   Chief Complaint  Patient presents with   Back Pain    Brief Narrative:  Patient is a 73 year old gentleman with history of paroxysmal A-fib on Eliquis , hypertension, hyperlipidemia, type 2 diabetes, metastatic bladder cancer status post cystoprostatectomy, total neurectomy and right nephroureterectomy and solitary left kidney status post ureterostomy currently on immunotherapy for bladder cancer recently admitted for AKI from 03/01/2024 to 03/02/2024 with mild to moderate hydronephrosis with stent retracted to the mid ureter on imaging whereby patient underwent stent exchange per urology with improvement with renal function. Patient presenting to the ED with complaint of severe low back pain with radiation to the right lower extremity causing difficulty ambulating and standing.  Patient also noted to have a complicated UTI placed on IV antibiotics and pancultured.  Blood cultures positive for Enterococcus faecalis.  ID consulted.  IR also consulted for evaluation for epidural steroid injection however recommended outpatient referral.   Assessment & Plan:   Principal Problem:   Lumbar radiculopathy Active Problems:   Bacteremia   Bladder cancer (HCC)   Complicated UTI (urinary tract infection)   Paroxysmal A-fib (HCC)   Hyponatremia   CKD (chronic kidney disease), stage III (HCC)  #1 severe low back pain secondary to lumbar radiculopathy -Patient denied any bowel or urinary incontinence, no lower extremity weakness or sensory loss. - It is noted that ED physician discussed lumbar MRI results with on-call provider for neurosurgery who recommended consulting IR for epidural steroid injection. - Patient received IV Decadron  8 mg in the ED. - Eliquis  held. - IR consulted for epidural steroid injection and recommended outpatient referral which has been placed per IR. -  Patient with improvement with lower back pain and radiculopathy after being started on current pain regimen on 03/29/2024.   - Continue Lyrica .  - Continue scheduled Tylenol  1000 mg 3 times daily, IV Decadron  4 mg every 8 hours, Lidoderm  patch.   -Could likely start weaning IV Decadron  in the next 24 hours. - IV Dilaudid  as needed for severe pain, oxycodone  as needed, Robaxin  as needed.   - Supportive care.   2.  Complicated UTI secondary to Enterococcus faecalis. -Urinalysis concerning for UTI. -Urine cultures with > 100,000 colonies of Enterococcus faecalis sensitive to ampicillin , Macrobid , vancomycin. -CT showed left ureteral stent called in the proximal left ureter with mild to moderate left-sided hydronephrosis.  Patient seen in consultation by urology who feel no acute urological intervention is indicated at this time however if patient becomes anuric, develops left flank pain or becomes febrile urology will need to be paged for consideration of stent exchange. -Will need outpatient follow-up with primary urologist. - Patient on IV Rocephin  and  IV ampicillin  due to bacteremia per ID.[name]  3.  Enterococcus faecalis bacteremia -Likely seeded from the urine. - Patient started on IV ampicillin . - Patient seen in consultation by ID who recommended discontinuation of IV cefepime  and placed patient on IV ampicillin  and IV Rocephin . 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   - ID recommended port removal, which was done per IR on 03/29/2024.   -Status post 1 dose of IV gentamicin  per ID due to history of Serratia.   -May need TEE, will  defer to ID. -Repeat blood cultures from 03/28/2024 with no growth to date x 2 days. - ID following and appreciate their input and recommendations.   4.  Mild  hyponatremia - Improved with hydration.   5.  CKD stage IIIa -Stable. -Urine output recorded of 11.7 L.??  Accuracy - Continue gentle hydration for another 24 hours.  6.  Paroxysmal A-fib -Continue home regimen metoprolol  for rate control. - Eliquis  for anticoagulation.  7.  Hypertension - Continue Toprol -XL.   8.  Hyperlipidemia - Continue Lipitor  9.  Type 2 diabetes mellitus -Hemoglobin A1c 7.1 on 02/13/2024. -CBG at 203 and patient on IV Decadron .  - Continue Semglee  to 10 units a day.  - Increased meal coverage NovoLog  6 units 3 times daily with meals.  - SSI.    10.  Chronic anemia -Hemoglobin stable at 9.1.  11.  Metastatic bladder cancer -Status post cystoprostatectomy, total ureterectomy and right nephro ureterectomy and solitary left kidney status post ureterostomy. - Currently on treatment with immunotherapy. - Oncology following and in agreement and okay with Port-A-Cath removal as recommended per ID due to bacteremia.  - Port-A-Cath removed per IR on 03/29/2024.    DVT prophylaxis: Eliquis  Code Status: Full Family Communication: Updated patient.  No family at bedside. Disposition: TBD  Status is: Inpatient The patient will require care spanning > 2 midnights and should be moved to inpatient because: Severity of illness   Consultants:  Urology: Dr. Freddi Jaeger 03/28/2024 ID Dr. Zelda Hickman 03/28/2024 Oncology.  Procedures:  CT renal stone protocol 03/27/2024 2D echo 03/29/2024 Port-A-Cath explant per IR 03/29/2024  Antimicrobials:  Anti-infectives (From admission, onward)    Start     Dose/Rate Route Frequency Ordered Stop   03/28/24 1400  cefTRIAXone  (ROCEPHIN ) 2 g in sodium chloride  0.9 % 100 mL IVPB        2 g 200 mL/hr over 30 Minutes Intravenous Every 12 hours 03/28/24 1356     03/28/24 1400  gentamicin  (GARAMYCIN ) 450 mg in dextrose  5 % 50 mL IVPB        5 mg/kg  90.7 kg 122.5 mL/hr over 30 Minutes Intravenous  Once 03/28/24 1356 03/28/24 1650   03/28/24 1200   ampicillin  (OMNIPEN) 2 g in sodium chloride  0.9 % 100 mL IVPB        2 g 300 mL/hr over 20 Minutes Intravenous Every 4 hours 03/28/24 1015     03/28/24 1000  ampicillin  (OMNIPEN) 2 g in sodium chloride  0.9 % 100 mL IVPB  Status:  Discontinued        2 g 300 mL/hr over 20 Minutes Intravenous Every 4 hours 03/28/24 0952 03/28/24 1015   03/28/24 0600  ceFEPIme  (MAXIPIME ) 2 g in sodium chloride  0.9 % 100 mL IVPB  Status:  Discontinued        2 g 200 mL/hr over 30 Minutes Intravenous Every 8 hours 03/28/24 0330 03/28/24 1356   03/27/24 1715  cefTRIAXone  (ROCEPHIN ) 1 g in sodium chloride  0.9 % 100 mL IVPB        1 g 200 mL/hr over 30 Minutes Intravenous  Once 03/27/24 1701 03/27/24 1936         Subjective: Patient laying in bed writhing around trying to get comfortable.  Denies any chest pain or shortness of breath.  No abdominal pain.  Overall feels low back pain radiating to right lower extremity improved on current regimen.  Tolerating current diet.     Objective: Vitals:   03/30/24 0510 03/30/24 1222 03/30/24  2127 03/31/24 0448  BP: 113/73 118/63 133/70 (!) 144/77  Pulse: 66 72 61 (!) 57  Resp: 18 18 17 17   Temp: 98.8 F (37.1 C) 98 F (36.7 C) (!) 97.4 F (36.3 C) 97.8 F (36.6 C)  TempSrc:      SpO2: 97% 100% 99% 98%  Weight:      Height:        Intake/Output Summary (Last 24 hours) at 03/31/2024 1205 Last data filed at 03/31/2024 0820 Gross per 24 hour  Intake 480 ml  Output 2680 ml  Net -2200 ml   Filed Weights   03/27/24 1327 03/27/24 1330  Weight: 90.7 kg 90.7 kg    Examination:  General exam: NAD.  Dressing over prior Port-A-Cath site.  Respiratory system: Lungs clear to auscultation bilaterally.  No wheezes, no crackles, no rhonchi.  Fair air movement.  Speaking in full sentences.  Cardiovascular system: Regular rate rhythm no murmurs rubs or gallops.  No JVD.  No pitting lower extremity edema.  Gastrointestinal system: Abdomen is soft, nontender, nondistended,  positive bowel sounds.  No rebound.  No guarding.  Urostomy tube in place with Foley bag attached with clear yellow urine.  Central nervous system: Alert and oriented. No focal neurological deficits. Extremities: Symmetric 5 x 5 power. Skin: No rashes, lesions or ulcers Psychiatry: Judgement and insight appear normal. Mood & affect appropriate.     Data Reviewed: I have personally reviewed following labs and imaging studies  CBC: Recent Labs  Lab 03/27/24 1444 03/28/24 0320 03/29/24 0448 03/30/24 0616 03/31/24 0625  WBC 13.4* 12.0* 11.5* 10.0 8.9  NEUTROABS 10.4*  --  8.9* 8.3*  --   HGB 10.6* 10.2* 9.4* 8.8* 9.1*  HCT 31.2* 30.8* 28.9* 27.1* 28.3*  MCV 83.6 84.6 85.3 84.4 86.3  PLT 171 152 158 151 153    Basic Metabolic Panel: Recent Labs  Lab 03/27/24 1444 03/28/24 0320 03/29/24 0448 03/30/24 0616 03/31/24 0625  NA 130* 130* 134* 132* 134*  K 4.1 4.4 3.8 4.6 4.9  CL 98 99 105 103 105  CO2 20* 18* 20* 23 22  GLUCOSE 172* 171* 146* 237* 213*  BUN 26* 21 23 27* 29*  CREATININE 1.54* 1.24 1.16 1.30* 1.21  CALCIUM  9.1 9.0 8.7* 8.2* 8.1*  MG  --   --  1.8 2.0  --     GFR: Estimated Creatinine Clearance: 65 mL/min (by C-G formula based on SCr of 1.21 mg/dL).  Liver Function Tests: Recent Labs  Lab 03/27/24 1444  AST 19  ALT 13  ALKPHOS 81  BILITOT 0.9  PROT 7.8  ALBUMIN 3.5    CBG: Recent Labs  Lab 03/30/24 0739 03/30/24 1157 03/30/24 1642 03/30/24 2124 03/31/24 0735  GLUCAP 265* 317* 334* 197* 203*     Recent Results (from the past 240 hours)  Urine Culture (for pregnant, neutropenic or urologic patients or patients with an indwelling urinary catheter)     Status: Abnormal   Collection Time: 03/27/24  2:06 PM   Specimen: Urine, Clean Catch  Result Value Ref Range Status   Specimen Description   Final    URINE, CLEAN CATCH Performed at [facility], [address] [place], [place] [address]    Special Requests   Final     NONE Performed at [facility], [address], [place], [place] [address]    Culture (A)  Final    >=100,000 COLONIES/mL ENTEROCOCCUS FAECALIS WITHIN MIXED ORGANISMS Performed at [facility], [address] [place], [place] [address]    Report Status 03/29/2024 FINAL  Final   Organism ID, Bacteria ENTEROCOCCUS FAECALIS (A)  Final      Susceptibility   Enterococcus faecalis - MIC*    AMPICILLIN  <=2 SENSITIVE Sensitive     NITROFURANTOIN  <=16 SENSITIVE Sensitive     VANCOMYCIN 2 SENSITIVE Sensitive     * >=100,000 COLONIES/mL ENTEROCOCCUS FAECALIS  Blood culture (routine x 2)     Status: Abnormal   Collection Time: 03/27/24  2:44 PM   Specimen: BLOOD LEFT ARM  Result Value Ref Range Status   Specimen Description   Final    BLOOD LEFT ARM Performed at [facility], [address] [place], [place] [address]    Special Requests   Final    BOTTLES DRAWN AEROBIC AND ANAEROBIC Blood Culture adequate volume Performed at [facility], [address] [place], [place] [address]    Culture  Setup Time   Final    GRAM POSITIVE COCCI IN CHAINS AEROBIC BOTTLE ONLY CRITICAL RESULT CALLED TO, READ BACK BY AND VERIFIED WITH: PHARMD Annmarie Basket on [id number] @0929  by SM Performed at [facility], [address], [place], [place] [address]    Culture ENTEROCOCCUS FAECALIS (A)  Final   Report Status 03/30/2024 FINAL  Final   Organism ID, Bacteria ENTEROCOCCUS FAECALIS  Final      Susceptibility   Enterococcus faecalis - MIC*    AMPICILLIN  <=2 SENSITIVE Sensitive     VANCOMYCIN 2 SENSITIVE Sensitive     GENTAMICIN  SYNERGY SENSITIVE Sensitive     * ENTEROCOCCUS FAECALIS  Blood culture (routine x 2)     Status: Abnormal   Collection Time: 03/27/24  2:44 PM   Specimen: BLOOD RIGHT ARM  Result Value Ref Range Status   Specimen Description   Final    BLOOD RIGHT ARM Performed at [facility], [address], [place], [place] [address]     Special Requests   Final    BOTTLES DRAWN AEROBIC AND ANAEROBIC Blood Culture results may not be optimal due to an inadequate volume of blood received in culture bottles Performed at [facility], [address] [place], [place] [address]    Culture  Setup Time   Final    GRAM POSITIVE COCCI IN BOTH AEROBIC AND ANAEROBIC BOTTLES CRITICAL VALUE NOTED.  VALUE IS CONSISTENT WITH PREVIOUSLY REPORTED AND CALLED VALUE.    Culture (A)  Final    ENTEROCOCCUS FAECALIS SUSCEPTIBILITIES PERFORMED ON PREVIOUS CULTURE WITHIN THE LAST 5 DAYS. Performed at [facility], [address] [place], [place] [address]    Report Status 03/30/2024 FINAL  Final  Blood Culture ID Panel (Reflexed)     Status: Abnormal   Collection Time: 03/27/24  2:44 PM  Result Value Ref Range Status   Enterococcus faecalis DETECTED (A) NOT DETECTED Final    Comment: CRITICAL RESULT CALLED TO, READ BACK BY AND VERIFIED WITH: PHARMD Justin L on [id number] @0929  by SM    Enterococcus Faecium NOT DETECTED NOT DETECTED Final   Listeria monocytogenes NOT DETECTED NOT DETECTED Final   Staphylococcus species NOT DETECTED NOT DETECTED Final   Staphylococcus aureus (BCID) NOT DETECTED NOT DETECTED Final   Staphylococcus epidermidis NOT DETECTED NOT DETECTED Final   Staphylococcus lugdunensis NOT DETECTED NOT DETECTED Final   Streptococcus species NOT DETECTED NOT DETECTED Final   Streptococcus agalactiae NOT DETECTED NOT DETECTED Final  Streptococcus pneumoniae NOT DETECTED NOT DETECTED Final   Streptococcus pyogenes NOT DETECTED NOT DETECTED Final   A.calcoaceticus-baumannii NOT DETECTED NOT DETECTED Final   Bacteroides fragilis NOT DETECTED NOT DETECTED Final   Enterobacterales NOT DETECTED NOT DETECTED Final   Enterobacter cloacae complex NOT DETECTED NOT DETECTED Final   Escherichia coli NOT DETECTED NOT DETECTED Final   Klebsiella aerogenes NOT DETECTED NOT DETECTED Final   Klebsiella oxytoca NOT  DETECTED NOT DETECTED Final   Klebsiella pneumoniae NOT DETECTED NOT DETECTED Final   Proteus species NOT DETECTED NOT DETECTED Final   Salmonella species NOT DETECTED NOT DETECTED Final   Serratia marcescens NOT DETECTED NOT DETECTED Final   Haemophilus influenzae NOT DETECTED NOT DETECTED Final   Neisseria meningitidis NOT DETECTED NOT DETECTED Final   Pseudomonas aeruginosa NOT DETECTED NOT DETECTED Final   Stenotrophomonas maltophilia NOT DETECTED NOT DETECTED Final   Candida albicans NOT DETECTED NOT DETECTED Final   Candida auris NOT DETECTED NOT DETECTED Final   Candida glabrata NOT DETECTED NOT DETECTED Final   Candida krusei NOT DETECTED NOT DETECTED Final   Candida parapsilosis NOT DETECTED NOT DETECTED Final   Candida tropicalis NOT DETECTED NOT DETECTED Final   Cryptococcus neoformans/gattii NOT DETECTED NOT DETECTED Final   Vancomycin resistance NOT DETECTED NOT DETECTED Final    Comment: Performed at [facility], [address], [place], [place] [address]  Culture, blood (Routine X 2) w Reflex to ID Panel     Status: None (Preliminary result)   Collection Time: 03/28/24  4:15 PM   Specimen: BLOOD  Result Value Ref Range Status   Specimen Description   Final    BLOOD SITE NOT SPECIFIED Performed at [facility], [address], [place], [place] [address]    Special Requests   Final    BOTTLES DRAWN AEROBIC AND ANAEROBIC Blood Culture adequate volume Performed at [facility], [address] [place], [place] [address]    Culture   Final    NO GROWTH 2 DAYS Performed at [facility], [address], [place], [place] [address]    Report Status PENDING  Incomplete  Culture, blood (Routine X 2) w Reflex to ID Panel     Status: None (Preliminary result)   Collection Time: 03/28/24  4:24 PM   Specimen: BLOOD  Result Value Ref Range Status   Specimen Description   Final    BLOOD SITE NOT SPECIFIED Performed at [facility], [address], [place], [place] [address]    Special Requests   Final    BOTTLES DRAWN AEROBIC AND ANAEROBIC Blood Culture adequate volume Performed at [facility], [address] [place], [place] [address]    Culture   Final    NO GROWTH 2 DAYS Performed at [facility], [address] [place], [place] [address]    Report Status PENDING  Incomplete         Radiology Studies: ECHOCARDIOGRAM COMPLETE Result Date: 03/29/2024    ECHOCARDIOGRAM REPORT   Patient Name:   Robert [name] Date of Exam: 03/29/2024 Medical Rec #:  [id number]             Height:       75.0 in Accession #:    [id number]            Weight:       200.0 lb Date of Birth:  Apr 05, 1951  BSA:          2.194 m Patient Age:    72 years              BP:           118/58 mmHg Patient Gender: M                     HR:           75 bpm. Exam Location:  Inpatient Procedure: 2D Echo, Cardiac Doppler and Color Doppler (Both Spectral and Color            Flow Doppler were utilized during procedure). Indications:    Endocarditis I38  History:        Patient has prior history of Echocardiogram examinations, most                 recent 11/03/2023.  Sonographer:    Hersey Lorenzo RDCS Referring Phys: [id number] [facility] IMPRESSIONS  1. Overall poor quality study for vegetation assessment.  2. Left ventricular ejection fraction, by estimation, is 65 to 70%. The left ventricle has normal function. The left ventricle has no regional wall motion abnormalities. There is mild left ventricular hypertrophy. Left ventricular diastolic parameters are indeterminate.  3. Right ventricular systolic function is normal. The right ventricular size is normal.  4. Left atrial size was mildly dilated.  5. 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 The mitral valve is abnormal. Trivial mitral valve regurgitation. No evidence of mitral stenosis. Severe mitral annular calcification.  6. The  aortic valve was not well visualized. There is moderate calcification of the aortic valve. There is moderate thickening of the aortic valve. Aortic valve regurgitation is trivial. Aortic valve sclerosis is present, with no evidence of aortic valve stenosis.  7. Aortic dilatation noted. There is mild dilatation of the aortic root, measuring 39 mm.  8. The inferior vena cava is dilated in size with >50% respiratory variability, suggesting right atrial pressure of 8 mmHg. FINDINGS  Left Ventricle: Left ventricular ejection fraction, by estimation, is 65 to 70%. The left ventricle has normal function. The left ventricle has no regional wall motion abnormalities. Strain was performed and the global longitudinal strain is indeterminate. The left ventricular internal cavity size was normal in size. There is mild left ventricular hypertrophy. Left ventricular diastolic parameters are indeterminate. Right Ventricle: The right ventricular size is normal. No increase in right ventricular wall thickness. Right ventricular systolic function is normal. Left Atrium: Left atrial size was mildly dilated. Right Atrium: Right atrial size was normal in size. Pericardium: There is no evidence of pericardial effusion. 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 The mitral valve is abnormal. There is mild thickening of the mitral valve leaflet(s). There is mild calcification of the mitral valve leaflet(s). Severe mitral annular calcification. Trivial mitral valve regurgitation. No evidence of mitral valve stenosis. Tricuspid Valve: The tricuspid valve is normal in structure. Tricuspid valve regurgitation is trivial. No evidence of tricuspid stenosis. Aortic Valve: The aortic valve was not well visualized. There is moderate calcification of the aortic valve. There is moderate thickening of the aortic valve. Aortic valve regurgitation is trivial. Aortic valve sclerosis is present, with no evidence  of aortic valve stenosis. Pulmonic Valve: The pulmonic valve was not well visualized. Pulmonic valve regurgitation is not visualized. No evidence of pulmonic stenosis. Aorta: Aortic dilatation noted. There is mild dilatation of the  aortic root, measuring 39 mm. Venous: The inferior vena cava is dilated in size with greater than 50% respiratory variability, suggesting right atrial pressure of 8 mmHg. IAS/Shunts: No atrial level shunt detected by color flow Doppler. Additional Comments: Overall poor quality study for vegetation assessment. 3D was performed not requiring image post processing on an independent workstation and was indeterminate.  LEFT VENTRICLE PLAX 2D LVIDd:         5.60 cm   Diastology LVIDs:         3.90 cm   LV e' medial:    5.44 cm/s LV PW:         1.20 cm   LV E/e' medial:  27.6 LV IVS:        1.20 cm   LV e' lateral:   8.27 cm/s LVOT diam:     2.30 cm   LV E/e' lateral: 18.1 LV SV:         96 LV SV Index:   44 LVOT Area:     4.15 cm  RIGHT VENTRICLE             IVC RV S prime:     15.30 cm/s  IVC diam: 2.60 cm TAPSE (M-mode): 2.7 cm LEFT ATRIUM            Index LA diam:      4.10 cm  1.87 cm/m LA Vol (A2C): 78.1 ml  35.59 ml/m LA Vol (A4C): 104.0 ml 47.39 ml/m  AORTIC VALVE LVOT Vmax:   117.00 cm/s LVOT Vmean:  75.000 cm/s LVOT VTI:    0.231 m  AORTA Ao Root diam: 3.90 cm Ao Asc diam:  3.50 cm MITRAL VALVE MV Area (PHT): 3.08 cm     SHUNTS MV Decel Time: 246 msec     Systemic VTI:  0.23 m MV E velocity: 150.00 cm/s  Systemic Diam: 2.30 cm MV A velocity: 107.00 cm/s MV E/A ratio:  1.40 Janelle Mediate MD Electronically signed by Janelle Mediate MD Signature Date/Time: 03/29/2024/4:30:20 PM    Final    IR REMOVAL TUN ACCESS W/ PORT W/O FL MOD SED Result Date: 03/29/2024 INDICATION: 73 year old with bacteremia.  Request for Port-A-Cath removal. EXAM: REMOVAL OF RIGHT CHEST PORT-A-CATH MEDICATIONS: 1% lidocaine  for local anesthetic ANESTHESIA/SEDATION: None FLUOROSCOPY: None COMPLICATIONS: None  immediate. PROCEDURE: Informed written consent was obtained from the patient after a thorough discussion of the procedural risks, benefits and alternatives. All questions were addressed. Maximal Sterile Barrier Technique was utilized including caps, mask, sterile gowns, sterile gloves, sterile drape, hand hygiene and skin antiseptic. A timeout was performed prior to the initiation of the procedure. The right chest was prepped and draped in a sterile fashion. 1% lidocaine  was utilized for local anesthesia. An incision was made over the previously healed surgical incision. Utilizing blunt dissection, the port catheter and reservoir were removed from the underlying subcutaneous tissue in their entirety. The pocket was irrigated with a copious amount of sterile normal saline. The subcutaneous tissue was closed with 3-0 Vicryl interrupted subcutaneous stitches. A 4-0 Vicryl running subcuticular stitch was utilized to approximate the skin. Dermabond was applied. FINDINGS: Port skin site looked normal and healthy. Port pocket was examined after the port was removed. Port pocket looked healthy. Port pocket was closed without complication. IMPRESSION: Successful removal of right chest Port-A-Cath. Electronically Signed   By: Elene Griffes M.D.   On: 03/29/2024 16:16        Scheduled Meds:  acetaminophen   1,000 mg Oral TID  apixaban   5 mg Oral BID   atorvastatin   40 mg Oral QHS   dexamethasone  (DECADRON ) injection  4 mg Intravenous Q8H   insulin  aspart  0-15 Units Subcutaneous TID WC   insulin  aspart  0-5 Units Subcutaneous QHS   insulin  aspart  4 Units Subcutaneous TID WC   insulin  glargine-yfgn  10 Units Subcutaneous Daily   lidocaine   1 patch Transdermal Q24H   lidocaine -EPINEPHrine   20 mL Intradermal Once   metoprolol  succinate  25 mg Oral QHS   polyethylene glycol  17 g Oral Daily   pregabalin   75 mg Oral Daily   senna-docusate  1 tablet Oral BID   Continuous Infusions:  sodium chloride  75 mL/hr at  03/31/24 1008   ampicillin  (OMNIPEN) IV 2 g (03/31/24 1006)   cefTRIAXone  (ROCEPHIN )  IV 2 g (03/31/24 1020)     LOS: 3 days    Time spent: 40 minutes    Hilda Lovings, MD [facility]   To contact the attending provider between 7A-7P or the covering provider during after hours 7P-7A, please log into the web site [email or web address] and access using universal [place] password for that web site. If you do not have the password, please call the hospital operator.  03/31/2024, 12:05 PM

## 2024-04-01 ENCOUNTER — Encounter: Payer: Self-pay | Admitting: Hematology

## 2024-04-01 DIAGNOSIS — C679 Malignant neoplasm of bladder, unspecified: Secondary | ICD-10-CM

## 2024-04-01 DIAGNOSIS — B952 Enterococcus as the cause of diseases classified elsewhere: Secondary | ICD-10-CM

## 2024-04-01 DIAGNOSIS — N390 Urinary tract infection, site not specified: Secondary | ICD-10-CM | POA: Diagnosis not present

## 2024-04-01 DIAGNOSIS — I480 Paroxysmal atrial fibrillation: Secondary | ICD-10-CM | POA: Diagnosis not present

## 2024-04-01 DIAGNOSIS — M5416 Radiculopathy, lumbar region: Secondary | ICD-10-CM | POA: Diagnosis not present

## 2024-04-01 DIAGNOSIS — R7881 Bacteremia: Secondary | ICD-10-CM | POA: Diagnosis not present

## 2024-04-01 DIAGNOSIS — N1831 Chronic kidney disease, stage 3a: Secondary | ICD-10-CM | POA: Diagnosis not present

## 2024-04-01 LAB — CBC
HCT: 27.9 % — ABNORMAL LOW (ref 39.0–52.0)
Hemoglobin: 9 g/dL — ABNORMAL LOW (ref 13.0–17.0)
MCH: 28 pg (ref 26.0–34.0)
MCHC: 32.3 g/dL (ref 30.0–36.0)
MCV: 86.9 fL (ref 80.0–100.0)
Platelets: 151 10*3/uL (ref 150–400)
RBC: 3.21 MIL/uL — ABNORMAL LOW (ref 4.22–5.81)
RDW: 14.1 % (ref 11.5–15.5)
WBC: 9.4 10*3/uL (ref 4.0–10.5)
nRBC: 0 % (ref 0.0–0.2)

## 2024-04-01 LAB — BASIC METABOLIC PANEL WITH GFR
Anion gap: 8 (ref 5–15)
BUN: 32 mg/dL — ABNORMAL HIGH (ref 8–23)
CO2: 22 mmol/L (ref 22–32)
Calcium: 8 mg/dL — ABNORMAL LOW (ref 8.9–10.3)
Chloride: 102 mmol/L (ref 98–111)
Creatinine, Ser: 1.3 mg/dL — ABNORMAL HIGH (ref 0.61–1.24)
GFR, Estimated: 58 mL/min — ABNORMAL LOW (ref >60)
Glucose, Bld: 254 mg/dL — ABNORMAL HIGH (ref 70–99)
Potassium: 4.7 mmol/L (ref 3.5–5.1)
Sodium: 132 mmol/L — ABNORMAL LOW (ref 135–145)

## 2024-04-01 LAB — GLUCOSE, CAPILLARY
Glucose-Capillary: 220 mg/dL — ABNORMAL HIGH (ref 70–99)
Glucose-Capillary: 313 mg/dL — ABNORMAL HIGH (ref 70–99)
Glucose-Capillary: 252 mg/dL — ABNORMAL HIGH (ref 70–99)
Glucose-Capillary: 162 mg/dL — ABNORMAL HIGH (ref 70–99)

## 2024-04-01 MED ORDER — INSULIN GLARGINE-YFGN 100 UNIT/ML ~~LOC~~ SOLN
15.0000 [IU] | Freq: Every day | SUBCUTANEOUS | Status: DC
  Administered 2024-04-03: 15 [IU] via SUBCUTANEOUS
  Filled 2024-04-01 (×2): qty 0.15

## 2024-04-01 MED ORDER — INSULIN GLARGINE-YFGN 100 UNIT/ML ~~LOC~~ SOLN
5.0000 [IU] | Freq: Once | SUBCUTANEOUS | Status: AC
  Administered 2024-04-01: 5 [IU] via SUBCUTANEOUS
  Filled 2024-04-01: qty 0.05

## 2024-04-01 NOTE — Progress Notes (Signed)
 PROGRESS NOTE    Robert [name]  WUJ:[id number] DOB: 1951-08-07 DOA: 03/27/2024 PCP: Robert Kettering, MD   Chief Complaint  Patient presents with   Back Pain    Brief Narrative:  Patient is a 73 year old gentleman with history of paroxysmal A-fib on Eliquis , hypertension, hyperlipidemia, type 2 diabetes, metastatic bladder cancer status post cystoprostatectomy, total neurectomy and right nephroureterectomy and solitary left kidney status post ureterostomy currently on immunotherapy for bladder cancer recently admitted for AKI from 03/01/2024 to 03/02/2024 with mild to moderate hydronephrosis with stent retracted to the mid ureter on imaging whereby patient underwent stent exchange per urology with improvement with renal function. Patient presenting to the ED with complaint of severe low back pain with radiation to the right lower extremity causing difficulty ambulating and standing.  Patient also noted to have a complicated UTI placed on IV antibiotics and pancultured.  Blood cultures positive for Enterococcus faecalis.  ID consulted.  IR also consulted for evaluation for epidural steroid injection however recommended outpatient referral.   Assessment & Plan:   Principal Problem:   Lumbar radiculopathy Active Problems:   Bacteremia   Bladder cancer (HCC)   Complicated UTI (urinary tract infection)   Paroxysmal A-fib (HCC)   Hyponatremia   CKD (chronic kidney disease), stage III (HCC)  #1 severe low back pain secondary to lumbar radiculopathy -Patient denied any bowel or urinary incontinence, no lower extremity weakness or sensory loss. - It is noted that ED physician discussed lumbar MRI results with on-call provider for neurosurgery who recommended consulting IR for epidural steroid injection. - Patient received IV Decadron  8 mg in the ED. - Eliquis  held. - IR consulted for epidural steroid injection and recommended outpatient referral which has been placed per IR. -  Patient with improvement with lower back pain and radiculopathy after being started on current pain regimen on 03/29/2024.   - Continue Lyrica .  - Continue scheduled Tylenol  1000 mg 3 times daily, IV Decadron  4 mg every 8 hours, Lidoderm  patch.   -Could likely start weaning IV Decadron  in the next 24 hours. - IV Dilaudid  as needed for severe pain, oxycodone  as needed, Robaxin  as needed.   - Supportive care.   2.  Complicated UTI secondary to Enterococcus faecalis. -Urinalysis concerning for UTI. -Urine cultures with > 100,000 colonies of Enterococcus faecalis sensitive to ampicillin , Macrobid , vancomycin. -CT showed left ureteral stent called in the proximal left ureter with mild to moderate left-sided hydronephrosis.  Patient seen in consultation by urology who feel no acute urological intervention is indicated at this time however if patient becomes anuric, develops left flank pain or becomes febrile urology will need to be paged for consideration of stent exchange. -Will need outpatient follow-up with primary urologist. - Patient on IV Rocephin  and  IV ampicillin  due to bacteremia per ID.[name]  3.  Enterococcus faecalis bacteremia -Likely seeded from the urine. - Patient started on IV ampicillin . - Patient seen in consultation by ID who recommended discontinuation of IV cefepime  and placed patient on IV ampicillin  and IV Rocephin . 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   - ID recommended port removal, which was done per IR on 03/29/2024.   -Status post 1 dose of IV gentamicin  per ID due to history of Serratia.   -Repeat blood cultures from  03/28/2024 with no growth to date. -Repeat blood cultures post port removal ordered today per ID. -ID recommending TEE and consult placed to cardiology. - ID  following and appreciate their input and recommendations.   4.  Mild hyponatremia - Improved with hydration.   5.  CKD stage IIIa -Stable. -Urine output recorded of 2.2 L over the past 24 hours.  - Saline lock IV fluids.    6.  Paroxysmal A-fib -Continue home regimen metoprolol  for rate control. - Eliquis  for anticoagulation.  7.  Hypertension - Toprol -XL.   8.  Hyperlipidemia - Lipitor.   9.  Type 2 diabetes mellitus -Hemoglobin A1c 7.1 on 02/13/2024. -CBG at 220 this morning and patient on IV Decadron .  - Increase Semglee  to 15 units daily. - Continue meal coverage NovoLog  6 units 3 times daily with meals.  - SSI.   - Will likely need to adjust insulin  with steroid taper.  10.  Chronic anemia -Hemoglobin stable at 9.0.  11.  Metastatic bladder cancer -Status post cystoprostatectomy, total ureterectomy and right nephro ureterectomy and solitary left kidney status post ureterostomy. - Currently on treatment with immunotherapy. - Oncology was following and in agreement and okay with Port-A-Cath removal as recommended per ID due to bacteremia.  - Port-A-Cath removed per IR on 03/29/2024.    DVT prophylaxis: Eliquis  Code Status: Full Family Communication: Updated patient.  No family at bedside. Disposition: TBD  Status is: Inpatient The patient will require care spanning > 2 midnights and should be moved to inpatient because: Severity of illness   Consultants:  Urology: Dr. Freddi Jaeger 03/28/2024 ID Dr. Zelda Hickman 03/28/2024 Oncology.  Procedures:  CT renal stone protocol 03/27/2024 2D echo 03/29/2024 Port-A-Cath explant per IR 03/29/2024  Antimicrobials:  Anti-infectives (From admission, onward)    Start     Dose/Rate Route Frequency Ordered Stop   03/28/24 1400  cefTRIAXone  (ROCEPHIN ) 2 g in sodium chloride  0.9 % 100 mL IVPB        2 g 200 mL/hr over 30 Minutes Intravenous Every 12 hours 03/28/24 1356     03/28/24 1400  gentamicin  (GARAMYCIN ) 450 mg in dextrose  5 % 50 mL IVPB         5 mg/kg  90.7 kg 122.5 mL/hr over 30 Minutes Intravenous  Once 03/28/24 1356 03/28/24 1650   03/28/24 1200  ampicillin  (OMNIPEN) 2 g in sodium chloride  0.9 % 100 mL IVPB        2 g 300 mL/hr over 20 Minutes Intravenous Every 4 hours 03/28/24 1015     03/28/24 1000  ampicillin  (OMNIPEN) 2 g in sodium chloride  0.9 % 100 mL IVPB  Status:  Discontinued        2 g 300 mL/hr over 20 Minutes Intravenous Every 4 hours 03/28/24 0952 03/28/24 1015   03/28/24 0600  ceFEPIme  (MAXIPIME ) 2 g in sodium chloride  0.9 % 100 mL IVPB  Status:  Discontinued        2 g 200 mL/hr over 30 Minutes Intravenous Every 8 hours 03/28/24 0330 03/28/24 1356   03/27/24 1715  cefTRIAXone  (ROCEPHIN ) 1 g in sodium chloride  0.9 % 100 mL IVPB        1 g 200 mL/hr over 30 Minutes Intravenous  Once 03/27/24 1701 03/27/24 1936         Subjective: Sitting up in bed.  Seems somewhat uncomfortable.  Some complaints of low back pain however states no longer radiating down his extremity.  Denies any chest pain or shortness of breath.  No  abdominal pain.  Tolerating current diet.    Objective: Vitals:   03/31/24 0448 03/31/24 1259 03/31/24 2050 04/01/24 0451  BP: (!) 144/77 126/75 133/74 (!) 141/66  Pulse: (!) 57 70 64 (!) 54  Resp: 17 20 17 17   Temp: 97.8 F (36.6 C) 98.1 F (36.7 C) 98 F (36.7 C) (!) 97.4 F (36.3 C)  TempSrc:  Oral Oral Oral  SpO2: 98% 98% 98% 97%  Weight:      Height:        Intake/Output Summary (Last 24 hours) at 04/01/2024 0942 Last data filed at 04/01/2024 0100 Gross per 24 hour  Intake 1595 ml  Output 1800 ml  Net -205 ml   Filed Weights   03/27/24 1327 03/27/24 1330  Weight: 90.7 kg 90.7 kg    Examination:  General exam: NAD.  Dressing over prior Port-A-Cath site.  Respiratory system: CTAB.  No wheezes, no crackles, no rhonchi.  Fair air movement.  Speaking full sentences.  Cardiovascular system: RRR no murmurs rubs or gallops.  No JVD.  No pitting lower extremity edema.   Gastrointestinal system: Abdomen is soft, nontender, nondistended, positive bowel sounds.  No rebound.  No guarding.  Urostomy tube in place with Foley catheter bag with clear yellow urine.  Central nervous system: Alert and oriented. No focal neurological deficits. Extremities: Symmetric 5 x 5 power. Skin: No rashes, lesions or ulcers Psychiatry: Judgement and insight appear normal. Mood & affect appropriate.     Data Reviewed: I have personally reviewed following labs and imaging studies  CBC: Recent Labs  Lab 03/27/24 1444 03/28/24 0320 03/29/24 0448 03/30/24 0616 03/31/24 0625 04/01/24 0525  WBC 13.4* 12.0* 11.5* 10.0 8.9 9.4  NEUTROABS 10.4*  --  8.9* 8.3*  --   --   HGB 10.6* 10.2* 9.4* 8.8* 9.1* 9.0*  HCT 31.2* 30.8* 28.9* 27.1* 28.3* 27.9*  MCV 83.6 84.6 85.3 84.4 86.3 86.9  PLT 171 152 158 151 153 151    Basic Metabolic Panel: Recent Labs  Lab 03/28/24 0320 03/29/24 0448 03/30/24 0616 03/31/24 0625 04/01/24 0525  NA 130* 134* 132* 134* 132*  K 4.4 3.8 4.6 4.9 4.7  CL 99 105 103 105 102  CO2 18* 20* 23 22 22   GLUCOSE 171* 146* 237* 213* 254*  BUN 21 23 27* 29* 32*  CREATININE 1.24 1.16 1.30* 1.21 1.30*  CALCIUM  9.0 8.7* 8.2* 8.1* 8.0*  MG  --  1.8 2.0  --   --     GFR: Estimated Creatinine Clearance: 60.5 mL/min (A) (by C-G formula based on SCr of 1.3 mg/dL (H)).  Liver Function Tests: Recent Labs  Lab 03/27/24 1444  AST 19  ALT 13  ALKPHOS 81  BILITOT 0.9  PROT 7.8  ALBUMIN 3.5    CBG: Recent Labs  Lab 03/31/24 0735 03/31/24 1208 03/31/24 1650 03/31/24 2050 04/01/24 0739  GLUCAP 203* 330* 201* 177* 220*     Recent Results (from the past 240 hours)  Urine Culture (for pregnant, neutropenic or urologic patients or patients with an indwelling urinary catheter)     Status: Abnormal   Collection Time: 03/27/24  2:06 PM   Specimen: Urine, Clean Catch  Result Value Ref Range Status   Specimen Description   Final    URINE, CLEAN  CATCH Performed at [facility], [address] [place], [place] [address]    Special Requests   Final    NONE Performed at [facility], [address] [place], [place] [address]    Culture (A)  Final    >=100,000 COLONIES/mL ENTEROCOCCUS FAECALIS WITHIN MIXED ORGANISMS Performed at [facility], [address] [place], [place] [address]    Report Status 03/29/2024 FINAL  Final   Organism ID, Bacteria ENTEROCOCCUS FAECALIS (A)  Final      Susceptibility   Enterococcus faecalis - MIC*    AMPICILLIN  <=2 SENSITIVE Sensitive     NITROFURANTOIN  <=16 SENSITIVE Sensitive     VANCOMYCIN 2 SENSITIVE Sensitive     * >=100,000 COLONIES/mL ENTEROCOCCUS FAECALIS  Blood culture (routine x 2)     Status: Abnormal   Collection Time: 03/27/24  2:44 PM   Specimen: BLOOD LEFT ARM  Result Value Ref Range Status   Specimen Description   Final    BLOOD LEFT ARM Performed at [facility], [address] [place], [place] [address]    Special Requests   Final    BOTTLES DRAWN AEROBIC AND ANAEROBIC Blood Culture adequate volume Performed at [facility], [address] [place], [place] [address]    Culture  Setup Time   Final    GRAM POSITIVE COCCI IN CHAINS AEROBIC BOTTLE ONLY CRITICAL RESULT CALLED TO, READ BACK BY AND VERIFIED WITH: PHARMD Annmarie Basket on [id number] @0929  by SM Performed at [facility], [address] [place], [place] [address]    Culture ENTEROCOCCUS FAECALIS (A)  Final   Report Status 03/30/2024 FINAL  Final   Organism ID, Bacteria ENTEROCOCCUS FAECALIS  Final      Susceptibility   Enterococcus faecalis - MIC*    AMPICILLIN  <=2 SENSITIVE Sensitive     VANCOMYCIN 2 SENSITIVE Sensitive     GENTAMICIN  SYNERGY SENSITIVE Sensitive     * ENTEROCOCCUS FAECALIS  Blood culture (routine x 2)     Status: Abnormal   Collection Time: 03/27/24  2:44 PM   Specimen: BLOOD RIGHT ARM  Result Value Ref Range Status    Specimen Description   Final    BLOOD RIGHT ARM Performed at [facility], [address] [place], [place] [address]    Special Requests   Final    BOTTLES DRAWN AEROBIC AND ANAEROBIC Blood Culture results may not be optimal due to an inadequate volume of blood received in culture bottles Performed at [facility], [address], [place], [place] [address]    Culture  Setup Time   Final    GRAM POSITIVE COCCI IN BOTH AEROBIC AND ANAEROBIC BOTTLES CRITICAL VALUE NOTED.  VALUE IS CONSISTENT WITH PREVIOUSLY REPORTED AND CALLED VALUE.    Culture (A)  Final    ENTEROCOCCUS FAECALIS SUSCEPTIBILITIES PERFORMED ON PREVIOUS CULTURE WITHIN THE LAST 5 DAYS. Performed at [facility], [address], [place], [place] [address]    Report Status 03/30/2024 FINAL  Final  Blood Culture ID Panel (Reflexed)     Status: Abnormal   Collection Time: 03/27/24  2:44 PM  Result Value Ref Range Status   Enterococcus faecalis DETECTED (A) NOT DETECTED Final    Comment: CRITICAL RESULT CALLED TO, READ BACK BY AND VERIFIED WITH: PHARMD Justin L on [id number] @0929  by SM    Enterococcus Faecium NOT DETECTED NOT DETECTED Final   Listeria monocytogenes NOT DETECTED NOT DETECTED Final   Staphylococcus species NOT DETECTED NOT DETECTED Final   Staphylococcus aureus (BCID) NOT DETECTED NOT DETECTED Final   Staphylococcus epidermidis NOT DETECTED NOT DETECTED Final   Staphylococcus  lugdunensis NOT DETECTED NOT DETECTED Final   Streptococcus species NOT DETECTED NOT DETECTED Final   Streptococcus agalactiae NOT DETECTED NOT DETECTED Final   Streptococcus pneumoniae NOT DETECTED NOT DETECTED Final   Streptococcus pyogenes NOT DETECTED NOT DETECTED Final   A.calcoaceticus-baumannii NOT DETECTED NOT DETECTED Final   Bacteroides fragilis NOT DETECTED NOT DETECTED Final   Enterobacterales NOT DETECTED NOT DETECTED Final   Enterobacter cloacae complex NOT DETECTED NOT DETECTED Final    Escherichia coli NOT DETECTED NOT DETECTED Final   Klebsiella aerogenes NOT DETECTED NOT DETECTED Final   Klebsiella oxytoca NOT DETECTED NOT DETECTED Final   Klebsiella pneumoniae NOT DETECTED NOT DETECTED Final   Proteus species NOT DETECTED NOT DETECTED Final   Salmonella species NOT DETECTED NOT DETECTED Final   Serratia marcescens NOT DETECTED NOT DETECTED Final   Haemophilus influenzae NOT DETECTED NOT DETECTED Final   Neisseria meningitidis NOT DETECTED NOT DETECTED Final   Pseudomonas aeruginosa NOT DETECTED NOT DETECTED Final   Stenotrophomonas maltophilia NOT DETECTED NOT DETECTED Final   Candida albicans NOT DETECTED NOT DETECTED Final   Candida auris NOT DETECTED NOT DETECTED Final   Candida glabrata NOT DETECTED NOT DETECTED Final   Candida krusei NOT DETECTED NOT DETECTED Final   Candida parapsilosis NOT DETECTED NOT DETECTED Final   Candida tropicalis NOT DETECTED NOT DETECTED Final   Cryptococcus neoformans/gattii NOT DETECTED NOT DETECTED Final   Vancomycin resistance NOT DETECTED NOT DETECTED Final    Comment: Performed at [facility], [address] [place], [place] [address]  Culture, blood (Routine X 2) w Reflex to ID Panel     Status: None (Preliminary result)   Collection Time: 03/28/24  4:15 PM   Specimen: BLOOD  Result Value Ref Range Status   Specimen Description   Final    BLOOD SITE NOT SPECIFIED Performed at [facility], [address], [place], [place] [address]    Special Requests   Final    BOTTLES DRAWN AEROBIC AND ANAEROBIC Blood Culture adequate volume Performed at [facility], [address], [place], [place] [address]    Culture   Final    NO GROWTH 4 DAYS Performed at [facility], [address], [place], [place] [address]    Report Status PENDING  Incomplete  Culture, blood (Routine X 2) w Reflex to ID Panel     Status: None (Preliminary result)   Collection Time: 03/28/24  4:24 PM   Specimen:  BLOOD  Result Value Ref Range Status   Specimen Description   Final    BLOOD SITE NOT SPECIFIED Performed at [facility], [address] [place], [place] [address]    Special Requests   Final    BOTTLES DRAWN AEROBIC AND ANAEROBIC Blood Culture adequate volume Performed at [facility], [address], [place], [place] [address]    Culture   Final    NO GROWTH 4 DAYS Performed at [facility], [address], [place], [place] [address]    Report Status PENDING  Incomplete         Radiology Studies: No results found.       Scheduled Meds:  acetaminophen   1,000 mg Oral TID   apixaban   5 mg Oral BID   atorvastatin   40 mg Oral QHS   dexamethasone  (DECADRON ) injection  4 mg Intravenous Q8H   insulin  aspart  0-15 Units Subcutaneous TID WC   insulin  aspart  0-5 Units Subcutaneous QHS   insulin  aspart  6 Units Subcutaneous TID WC   insulin  glargine-yfgn  10 Units Subcutaneous Daily   lidocaine   1 patch Transdermal Q24H   lidocaine -EPINEPHrine   20 mL Intradermal Once   metoprolol  succinate  25 mg Oral QHS   polyethylene glycol  17 g Oral Daily   pregabalin   75 mg Oral Daily   senna-docusate  1 tablet Oral BID   Continuous Infusions:  sodium chloride  75 mL/hr at 04/01/24 0012   ampicillin  (OMNIPEN) IV 2 g (04/01/24 0837)   cefTRIAXone  (ROCEPHIN )  IV 2 g (03/31/24 2233)     LOS: 4 days    Time spent: 40 minutes    Hilda Lovings, MD [facility]   To contact the attending provider between 7A-7P or the covering provider during after hours 7P-7A, please log into the web site [email or web address] and access using universal [place] password for that web site. If you do not have the password, please call the hospital operator.  04/01/2024, 9:42 AM

## 2024-04-01 NOTE — Inpatient Diabetes Management (Signed)
 Inpatient Diabetes Program Recommendations  AACE/ADA: New Consensus Statement on Inpatient Glycemic Control (2015)  Target Ranges:  Prepandial:   less than 140 mg/dL      Peak postprandial:   less than 180 mg/dL (1-2 hours)      Critically ill patients:  140 - 180 mg/dL   Lab Results  Component Value Date   GLUCAP 220 (H) 04/01/2024   HGBA1C 7.1 (A) 02/13/2024    Review of Glycemic Control  Latest Reference Range & Units 03/31/24 07:35 03/31/24 12:08 03/31/24 16:50 03/31/24 20:50 04/01/24 07:39  Glucose-Capillary 70 - 99 mg/dL 629 (H) 528 (H) 413 (H) 177 (H) 220 (H)   Diabetes history: DM 2 Outpatient Diabetes medications:  Ozempic  1 mg weekly Current orders for Inpatient glycemic control:  Novolog  0-15 units tid with meals and HS Semglee  10 units daily Decadron  4 mg IV q 8 hours Novolog  6 units tid with meals (hold if patient eats less than 50% or NPO)  Inpatient Diabetes Program Recommendations:    Consider slight increase of Semglee  to 15 units daily.  Blood sugars should improve as steroids reduced and insulin  will need to be reduced as well.   Thanks,  Josefa Ni, RN, BC-ADM Inpatient Diabetes Coordinator Pager [phone]  (8a-5p)

## 2024-04-01 NOTE — TOC Initial Note (Signed)
 Transition of Care [facility]) - Initial/Assessment Note    Patient Details  Name: Robert [name] MRN: [id number] Date of Birth: 05-05-1951  Transition of [facility] CM/SW Contact:    Marty Sleet, LCSW Phone Number: 04/01/2024, 3:02 PM  Clinical Narrative:                 Pt from home with spouse who has dementia and he is primary caregiver for. Pt ambulates with cane at baseline. Pt currently recommended for OPPT. Pt reports he may go home with IV abx and need HH vs OP. CSW will continue to follow for discharge plans.   Expected Discharge Plan: OP Rehab Barriers to Discharge: Continued Medical Work up   Patient Goals and CMS Choice Patient states their goals for this hospitalization and ongoing recovery are:: To return home          Expected Discharge Plan and Services In-house Referral: Clinical Social Work Discharge Planning Services: NA Post Acute Care Choice: NA Living arrangements for the past 2 months: Single Family Home                 DME Arranged: N/A DME Agency: NA                  Prior Living Arrangements/Services Living arrangements for the past 2 months: Single Family Home Lives with:: Spouse Patient language and need for interpreter reviewed:: Yes Do you feel safe going back to the place where you live?: Yes      Need for Family Participation in Patient Care: No (Comment) Care giver support system in place?: No (comment) Current home services: DME Jeananne Mighty) Criminal Activity/Legal Involvement Pertinent to Current Situation/Hospitalization: No - Comment as needed  Activities of Daily Living   ADL Screening (condition at time of admission) Independently performs ADLs?: Yes (appropriate for developmental age) Is the patient deaf or have difficulty hearing?: No Does the patient have difficulty seeing, even when wearing glasses/contacts?: No Does the patient have difficulty concentrating, remembering, or making decisions?: No  Permission  Sought/Granted Permission sought to share information with : Facility Industrial/product designer granted to share information with : Yes, Verbal Permission Granted              Emotional Assessment Appearance:: Appears stated age Attitude/Demeanor/Rapport: Engaged Affect (typically observed): Accepting Orientation: : Oriented to Self, Oriented to Place, Oriented to  Time, Oriented to Situation Alcohol  / Substance Use: Not Applicable Psych Involvement: No (comment)  Admission diagnosis:  Lumbar radiculopathy [M54.16] Acute low back pain with right-sided sciatica, unspecified back pain laterality [M54.41] Patient Active Problem List   Diagnosis Date Noted   Lumbar radiculopathy 03/28/2024   Hyponatremia 03/28/2024   CKD (chronic kidney disease), stage III (HCC) 03/28/2024   H/O left nephrectomy 03/19/2024   Neuropathy 03/19/2024   AKI (acute kidney injury) (HCC) 03/01/2024   Serratia marcescens infection 03/01/2024   Chills 02/29/2024   Medication management 02/29/2024   Hydronephrosis of left kidney 02/05/2024   Complicated UTI (urinary tract infection) 02/05/2024   Bacteremia 02/04/2024   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  10/09/2023   Hypomagnesemia 07/03/2023   Port-A-Cath in place 06/09/2023   Elevated PSA measurement 02/28/2023   Preop examination 02/27/2023   Osteoarthritis of right hip 06/29/2022   Bladder cancer (HCC) 01/31/2022   Primary osteoarthritis of right hip 10/19/2021   Long term current use of anticoagulant -Eliquis  03/12/2019   Paroxysmal A-fib (HCC) 03/12/2019   Overweight (BMI 25.0-29.9) 01/03/2019   Type 2 diabetes mellitus  without complication, without long-term current use of insulin  (HCC) 07/23/2018   BPH associated with nocturia 03/01/2017   Irregular heart rate 06/23/2011   Hx of colonic polyps    ERECTILE DYSFUNCTION 12/19/2008   SKIN CANCER, HX OF 12/19/2008   Hyperlipidemia 08/21/2007   Essential hypertension 08/21/2007   PCP:  Genia Kettering, MD Pharmacy:   [facility] [phone] Jonette Nestle, [place] - [address] [place] [address] Phone: [phone] Fax: [phone]     Social Drivers of Health (SDOH) Social History: SDOH Screenings   Food Insecurity: No Food Insecurity (03/28/2024)  Housing: Low Risk  (03/28/2024)  Transportation Needs: No Transportation Needs (03/28/2024)  Utilities: Not At Risk (03/28/2024)  Alcohol  Screen: Low Risk  (10/08/2023)  Depression (PHQ2-9): Low Risk  (03/26/2024)  Financial Resource Strain: Low Risk  (10/08/2023)  Physical Activity: Insufficiently Active (03/26/2024)  Social Connections: Moderately Isolated (03/28/2024)  Stress: No Stress Concern Present (10/08/2023)  Tobacco Use: High Risk (03/27/2024)  Health Literacy: Adequate Health Literacy (03/26/2024)   SDOH Interventions:     Readmission Risk Interventions    04/01/2024    2:58 PM 02/07/2024    2:07 PM 12/10/2023    4:50 PM  Readmission Risk Prevention Plan  Transportation Screening Complete Complete Complete  PCP or Specialist Appt within 5-7 Days   Complete  PCP or Specialist Appt within 3-5 Days Complete Complete   Home Care Screening   Complete  Medication Review (RN CM)   Complete  HRI or Home Care Consult Complete Complete   Social Work Consult for Recovery Care Planning/Counseling Complete Complete   Palliative Care Screening Not Applicable Not Applicable   Medication Review Oceanographer) Complete Referral to Pharmacy

## 2024-04-01 NOTE — Progress Notes (Signed)
 [facility]  Date of Admission:  03/27/2024   Total days of inpatient antibiotics 2  Principal Problem:   Lumbar radiculopathy Active Problems:   Paroxysmal A-fib (HCC)   Bladder cancer (HCC)   Bacteremia   Complicated UTI (urinary tract infection)   Hyponatremia   CKD (chronic kidney disease), stage III [facility])          Assessment: 73 year old male with bladder cancer status postchemotherapy, cystoprostatectomy, right nephroureterectomy,total urethrectomy now on nivolumab  every 4 weeks, admission in April for Serratia moccasins UTI/bacteremia treated with Cipro  and amoxicillin  x 2 weeks EOT 4/27, recent admission 5 9-5 10 for AKI underwent stent placement with urology presents with 1 day history of worsening back pain found to have   #E faecalis bacteremia secondary to UTI, patient has ostomy #Back pain #Bladder cancer, gets nivolumab  every 4 weeks via port - Patient denies any abdominal pain.  MR lumbar spine showed no concern for infection.  Urine and blood cultures from admission grew E faecalis(2/2 sets). - CT renal study showed coiled stent in left ureter, mild to moderate hydronephrosis.  Obstructive process not excluded. -Urine obtained from ostomy which incubating, leukocyte positive - ID engaged due to E faecalis bacteremia.  Patient denies fevers or chills. -Port was pulled on 6/6 noted port pocket looked normal and healthy. - TTE, [place] study.  Will order TEE - I was engaged for back pain.,  Recommended outpatient follow-up.  His manage with pain meds at this point. Recommendations: -Continue ceftriaxone  and ampicillin  to target E faecalis - Repeat blood cultures on 6/5 remain clear.  Will reorder blood cultures following port - tee ordered relayed to primary - Urology engaged per malpositioned ureteral stent, plans for further intervention at this point per primary   Evaluation of this patient requires complex antimicrobial therapy  evaluation and counseling + isolation needs for disease transmission risk assessment and mitigation    Microbiology:   Antibiotics: Ampicillin  6/5-present 6/4 cefepime  Ceftriaxone  6/4-present Gentamicin  6/5 Cultures: Blood 6/4 2/2 efaecalis 6/5 Urine 6/4 >100k e faecalis Other   SUBJECTIVE: Bed.  Back pain somewhat improved. Interval: afebrile overnight   Review of Systems: Review of Systems  All other systems reviewed and are negative.    Scheduled Meds:  acetaminophen   1,000 mg Oral TID   apixaban   5 mg Oral BID   atorvastatin   40 mg Oral QHS   dexamethasone  (DECADRON ) injection  4 mg Intravenous Q8H   insulin  aspart  0-15 Units Subcutaneous TID WC   insulin  aspart  0-5 Units Subcutaneous QHS   insulin  aspart  6 Units Subcutaneous TID WC   insulin  glargine-yfgn  10 Units Subcutaneous Daily   lidocaine   1 patch Transdermal Q24H   lidocaine -EPINEPHrine   20 mL Intradermal Once   metoprolol  succinate  25 mg Oral QHS   polyethylene glycol  17 g Oral Daily   pregabalin   75 mg Oral Daily   senna-docusate  1 tablet Oral BID   Continuous Infusions:  ampicillin  (OMNIPEN) IV 2 g (04/01/24 0837)   cefTRIAXone  (ROCEPHIN )  IV 2 g (04/01/24 1036)   PRN Meds:.acetaminophen  **OR** acetaminophen , guaiFENesin -dextromethorphan , HYDROmorphone  (DILAUDID ) injection, methocarbamol , naLOXone  (NARCAN )  injection, oxyCODONE  Allergies  Allergen Reactions   Lisinopril  Swelling and Other (See Comments)    Tongue and throat became swollen    OBJECTIVE: Vitals:   03/31/24 0448 03/31/24 1259 03/31/24 2050 04/01/24 0451  BP: (!) 144/77 126/75 133/74 (!) 141/66  Pulse: (!) 57  70 64 (!) 54  Resp: 17 20 17 17   Temp: 97.8 F (36.6 C) 98.1 F (36.7 C) 98 F (36.7 C) (!) 97.4 F (36.3 C)  TempSrc:  Oral Oral Oral  SpO2: 98% 98% 98% 97%  Weight:      Height:       Body mass index is 25 kg/m.  Physical Exam Constitutional:      General: He is not in acute distress.    Appearance:  He is normal weight. He is not toxic-appearing.  HENT:     Head: Normocephalic and atraumatic.     Right Ear: External ear normal.     Left Ear: External ear normal.     Nose: No congestion or rhinorrhea.     Mouth/Throat:     Mouth: Mucous membranes are moist.     Pharynx: Oropharynx is clear.  Eyes:     Extraocular Movements: Extraocular movements intact.     Conjunctiva/sclera: Conjunctivae normal.     Pupils: Pupils are equal, round, and reactive to light.  Cardiovascular:     Rate and Rhythm: Normal rate and regular rhythm.     Heart sounds: No murmur heard.    No friction rub. No gallop.  Pulmonary:     Effort: Pulmonary effort is normal.     Breath sounds: Normal breath sounds.  Abdominal:     General: Bowel sounds are normal.     Palpations: Abdomen is soft.     Comments:  urostomy  Musculoskeletal:        General: No swelling.     Cervical back: Normal range of motion and neck supple.  Skin:    General: Skin is warm and dry.  Neurological:     General: No focal deficit present.  Psychiatric:        Mood and Affect: Mood normal.       Lab Results Lab Results  Component Value Date   WBC 9.4 04/01/2024   HGB 9.0 (L) 04/01/2024   HCT 27.9 (L) 04/01/2024   MCV 86.9 04/01/2024   PLT 151 04/01/2024    Lab Results  Component Value Date   CREATININE 1.30 (H) 04/01/2024   BUN 32 (H) 04/01/2024   NA 132 (L) 04/01/2024   K 4.7 04/01/2024   CL 102 04/01/2024   CO2 22 04/01/2024    Lab Results  Component Value Date   ALT 13 03/27/2024   AST 19 03/27/2024   ALKPHOS 81 03/27/2024   BILITOT 0.9 03/27/2024        Orlie Bjornstad, MD [facility] 04/01/2024, 10:59 AM

## 2024-04-01 NOTE — Progress Notes (Signed)
   [facility] has been requested to perform a transesophageal echocardiogram on Robert [name] for E faecalis bacteremia.  TEE will be performed by Dr. Stann Earnest.  In 2015 there was concern that the patient may have OSA. It does not appear like there was a formal diagnosis of OSA. Patient feels like his symptoms have improved after he intentionally lost 40 lbs over the past few years.   The patient does NOT have any absolute or relative contraindications to a Transesophageal Echocardiogram (TEE).  The patient has: No other conditions that may impact this procedure.   After careful review of history and examination, the risks and benefits of transesophageal echocardiogram have been explained including risks of esophageal damage, perforation (1:10,000 risk), bleeding, pharyngeal hematoma as well as other potential complications associated with conscious sedation including aspiration, arrhythmia, respiratory failure and death. Alternatives to treatment were discussed, questions were answered. Patient is willing to proceed.   Eloy Half, PA-C  04/01/2024 3:28 PM

## 2024-04-01 NOTE — Plan of Care (Signed)
   Problem: Urinary Elimination: Goal: Ability to achieve and maintain urine output will improve Outcome: Progressing   Problem: Education: Goal: Ability to describe self-care measures that may prevent or decrease complications (Diabetes Survival Skills Education) will improve Outcome: Progressing    Problem: Skin Integrity: Goal: Risk for impaired skin integrity will decrease Outcome: Progressing   Problem: Clinical Measurements: Goal: Will remain free from infection Outcome: Progressing   Problem: Pain Managment: Goal: General experience of comfort will improve and/or be controlled Outcome: Progressing   Problem: Safety: Goal: Ability to remain free from injury will improve Outcome: Progressing

## 2024-04-01 NOTE — Progress Notes (Signed)
 Physical Therapy Treatment Patient Details Name: Robert [name] MRN: [id number] DOB: 13-Sep-1951 Today's Date: 04/01/2024   History of Present Illness 73 yo presents with severe low back pain with radiation to the R LE. IR also consulted for evaluation for epidural steroid injection however recommended outpatient referral on 6/5. Recently admitted for AKI from 03/01/2024 to 03/02/2024 with mild to moderate hydronephrosis with stent retracted to the mid ureter on imaging whereby patient underwent stent exchange per urology with improvement with renal function. PMH: paroxysmal A-fib on Eliquis , HTN, hyperlipidemia, type 2 diabetes, metastatic bladder cancer s/p cystoprostatectomy, total neurectomy and right nephroureterectomy and solitary left kidney s/p ureterostomy currently on immunotherapy for bladder cancer.    PT Comments   Pt admitted with above diagnosis.  Pt currently with functional limitations due to the deficits listed below (see PT Problem List). Pt in bed when PT arrived. Pt agreeable to therapy intervention and indicated his pain was better managed, PT communicated with nursing staff prior to PT tx session to maximize pharmaceuticals. Pt required S, cues and use of hospital bed for supine to sit, CGA for transfer and gait tasks 15 feet with RW in personal room. Pt left seated on commode, nurse tech aware and pt instructed to use pull cord when finished voiding with pt reporting return demonstration.  Pt will benefit from acute skilled PT to increase their independence and safety with mobility to allow discharge.      If plan is discharge home, recommend the following: A little help with walking and/or transfers;A little help with bathing/dressing/bathroom;Assistance with cooking/housework;Assist for transportation;Help with stairs or ramp for entrance   Can travel by private vehicle        Equipment Recommendations  None recommended by PT    Recommendations for Other Services        Precautions / Restrictions Precautions Precautions: Fall;Back Precaution/Restrictions Comments: urostomy Restrictions Weight Bearing Restrictions Per Provider Order: No     Mobility  Bed Mobility Overal bed mobility: Needs Assistance Bed Mobility: Supine to Sit   Sidelying to sit: HOB elevated, Used rails, Supervision       General bed mobility comments: cues for back precautions with HOB upright and min cues    Transfers Overall transfer level: Needs assistance Equipment used: Rolling walker (2 wheels) Transfers: Sit to/from Stand Sit to Stand: Contact guard assist, From elevated surface           General transfer comment: CGA and cues for sit to stand from elevated EOB, PT placed BSC over standard commode, pt reporting need to void bowels    Ambulation/Gait Ambulation/Gait assistance: Contact guard assist Gait Distance (Feet): 15 Feet Assistive device: Rolling walker (2 wheels) Gait Pattern/deviations: Step-through pattern, Trunk flexed, Antalgic, Decreased stance time - right Gait velocity: decreaed     General Gait Details: min cues for safety and posture   Stairs             Wheelchair Mobility     Tilt Bed    Modified Rankin (Stroke Patients Only)       Balance Overall balance assessment: Needs assistance   Sitting balance-Leahy Scale: Good     Standing balance support: Reliant on assistive device for balance, Bilateral upper extremity supported Standing balance-Leahy Scale: Poor                              Communication Communication Communication: No apparent difficulties  Cognition Arousal: Alert Behavior  During Therapy: WFL for tasks assessed/performed   PT - Cognitive impairments: No apparent impairments                       PT - Cognition Comments: pt pleasant, motivated Following commands: Intact      Cueing    Exercises      General Comments        Pertinent Vitals/Pain Pain  Assessment Pain Assessment: Faces Pain Score: 4  Pain Location: low back, right buttock down into feet, anterior tib Pain Descriptors / Indicators: Sharp, Shooting Pain Intervention(s): Limited activity within patient's tolerance, Monitored during session, Premedicated before session    Home Living                          Prior Function            PT Goals (current goals can now be found in the care plan section) Acute Rehab PT Goals Patient Stated Goal: pain control PT Goal Formulation: With patient Time For Goal Achievement: 04/12/24 Potential to Achieve Goals: Good Progress towards PT goals: Progressing toward goals    Frequency    Min 3X/week      PT Plan      Co-evaluation              AM-PAC PT "6 Clicks" Mobility   Outcome Measure  Help needed turning from your back to your side while in a flat bed without using bedrails?: None Help needed moving from lying on your back to sitting on the side of a flat bed without using bedrails?: A Little Help needed moving to and from a bed to a chair (including a wheelchair)?: A Little Help needed standing up from a chair using your arms (e.g., wheelchair or bedside chair)?: A Little Help needed to walk in hospital room?: A Little Help needed climbing 3-5 steps with a railing? : Total 6 Click Score: 17    End of Session   Activity Tolerance: Patient tolerated treatment well;No increased pain Patient left: Other (comment);with call bell/phone within reach (commode and nurse tech aware with pt reporting verbal understanding of use of call bell/pull cord to communicate needs with pt performing return demonstration and importance of awating staff assist prior to getting up from commode) Nurse Communication: Mobility status;Other (comment) (MD high pain) PT Visit Diagnosis: Other abnormalities of gait and mobility (R26.89);Pain Pain - Right/Left: Right Pain - part of body: Leg (back)     Time:  1610-9604 PT Time Calculation (min) (ACUTE ONLY): 17 min  Charges:    $Gait Training: 8-22 mins PT General Charges $$ ACUTE PT VISIT: 1 Visit                     Cary Clarks, PT Acute Rehab    Annalee Kiang 04/01/2024, 3:05 PM

## 2024-04-02 ENCOUNTER — Encounter (HOSPITAL_COMMUNITY): Payer: Self-pay | Admitting: Internal Medicine

## 2024-04-02 ENCOUNTER — Inpatient Hospital Stay (HOSPITAL_COMMUNITY): Admitting: Anesthesiology

## 2024-04-02 ENCOUNTER — Encounter (HOSPITAL_COMMUNITY)
Admission: EM | Disposition: A | Discharge status: Home / Self Care | Payer: Self-pay | Source: Home / Self Care | Attending: Internal Medicine

## 2024-04-02 ENCOUNTER — Inpatient Hospital Stay (HOSPITAL_COMMUNITY)

## 2024-04-02 DIAGNOSIS — I340 Nonrheumatic mitral (valve) insufficiency: Secondary | ICD-10-CM

## 2024-04-02 DIAGNOSIS — E119 Type 2 diabetes mellitus without complications: Secondary | ICD-10-CM | POA: Diagnosis not present

## 2024-04-02 DIAGNOSIS — I351 Nonrheumatic aortic (valve) insufficiency: Secondary | ICD-10-CM

## 2024-04-02 DIAGNOSIS — I480 Paroxysmal atrial fibrillation: Secondary | ICD-10-CM | POA: Diagnosis not present

## 2024-04-02 DIAGNOSIS — M5416 Radiculopathy, lumbar region: Secondary | ICD-10-CM | POA: Diagnosis not present

## 2024-04-02 DIAGNOSIS — I10 Essential (primary) hypertension: Secondary | ICD-10-CM | POA: Diagnosis not present

## 2024-04-02 DIAGNOSIS — I4891 Unspecified atrial fibrillation: Secondary | ICD-10-CM

## 2024-04-02 DIAGNOSIS — N1831 Chronic kidney disease, stage 3a: Secondary | ICD-10-CM | POA: Diagnosis not present

## 2024-04-02 DIAGNOSIS — R7881 Bacteremia: Secondary | ICD-10-CM

## 2024-04-02 HISTORY — PX: TRANSESOPHAGEAL ECHOCARDIOGRAM (CATH LAB): EP1270

## 2024-04-02 LAB — BASIC METABOLIC PANEL WITH GFR
Anion gap: 8 (ref 5–15)
BUN: 24 mg/dL — ABNORMAL HIGH (ref 8–23)
CO2: 24 mmol/L (ref 22–32)
Calcium: 8.1 mg/dL — ABNORMAL LOW (ref 8.9–10.3)
Chloride: 99 mmol/L (ref 98–111)
Creatinine, Ser: 1.21 mg/dL (ref 0.61–1.24)
GFR, Estimated: >60 mL/min (ref >60)
Glucose, Bld: 183 mg/dL — ABNORMAL HIGH (ref 70–99)
Potassium: 4.9 mmol/L (ref 3.5–5.1)
Sodium: 131 mmol/L — ABNORMAL LOW (ref 135–145)

## 2024-04-02 LAB — CULTURE, BLOOD (ROUTINE X 2)
Culture: NO GROWTH
Culture: NO GROWTH
Special Requests: ADEQUATE
Special Requests: ADEQUATE

## 2024-04-02 LAB — GLUCOSE, CAPILLARY
Glucose-Capillary: 175 mg/dL — ABNORMAL HIGH (ref 70–99)
Glucose-Capillary: 242 mg/dL — ABNORMAL HIGH (ref 70–99)
Glucose-Capillary: 233 mg/dL — ABNORMAL HIGH (ref 70–99)

## 2024-04-02 LAB — CBC
HCT: 28.9 % — ABNORMAL LOW (ref 39.0–52.0)
Hemoglobin: 9.6 g/dL — ABNORMAL LOW (ref 13.0–17.0)
MCH: 27.9 pg (ref 26.0–34.0)
MCHC: 33.2 g/dL (ref 30.0–36.0)
MCV: 84 fL (ref 80.0–100.0)
Platelets: 175 10*3/uL (ref 150–400)
RBC: 3.44 MIL/uL — ABNORMAL LOW (ref 4.22–5.81)
RDW: 14.4 % (ref 11.5–15.5)
WBC: 10.1 10*3/uL (ref 4.0–10.5)
nRBC: 0 % (ref 0.0–0.2)

## 2024-04-02 LAB — ECHO TEE
AR max vel: 2.58 cm2
AV Area VTI: 2.22 cm2
AV Area mean vel: 2.15 cm2
AV Mean grad: 6 mmHg
AV Peak grad: 11 mmHg
Ao pk vel: 1.66 m/s

## 2024-04-02 SURGERY — TRANSESOPHAGEAL ECHOCARDIOGRAM (TEE) (CATHLAB)
Anesthesia: Monitor Anesthesia Care

## 2024-04-02 MED ORDER — LIDOCAINE 2% (20 MG/ML) 5 ML SYRINGE
INTRAMUSCULAR | Status: DC | PRN
Start: 2024-04-02 — End: 2024-04-02
  Administered 2024-04-02: 20 mg via INTRAVENOUS

## 2024-04-02 MED ORDER — SODIUM CHLORIDE 0.9% FLUSH: 3.0000 mL | Freq: Two times a day (BID) | INTRAVENOUS | Status: DC

## 2024-04-02 MED ORDER — SODIUM CHLORIDE 0.9 % IV SOLN
INTRAVENOUS | Status: DC | PRN
Start: 2024-04-02 — End: 2024-04-02

## 2024-04-02 MED ORDER — PHENOL 1.4 % MT LIQD
1.0000 | OROMUCOSAL | Status: DC | PRN
  Filled 2024-04-02: qty 177

## 2024-04-02 MED ORDER — PROPOFOL 10 MG/ML IV BOLUS
INTRAVENOUS | Status: DC | PRN
  Administered 2024-04-02: 50 mg via INTRAVENOUS
  Administered 2024-04-02: 100 mg via INTRAVENOUS
  Administered 2024-04-02: 40 mg via INTRAVENOUS
  Administered 2024-04-02: 30 mg via INTRAVENOUS
  Administered 2024-04-02: 50 mg via INTRAVENOUS
  Administered 2024-04-02: 30 mg via INTRAVENOUS

## 2024-04-02 MED ORDER — SODIUM CHLORIDE 0.9% FLUSH: 3.0000 mL | INTRAVENOUS | Status: DC | PRN

## 2024-04-02 NOTE — CV Procedure (Signed)
 TEE: Anesthesia: Propofol   EF 65% AV sclerosis no vegetation MAC with mild/mod MR no vegetation Normal TV mild TR Normal PV mild PR EF 65% No ASD/PFO No LAA thrombus  No effusion Normal aortic root  No evidence of SBE  Janelle Mediate MD [facility]

## 2024-04-02 NOTE — H&P (View-Only) (Signed)
 PROGRESS NOTE    Robert [name]  QIH:[id number] DOB: Jan 19, 1951 DOA: 03/27/2024 PCP: Genia Kettering, MD   Chief Complaint  Patient presents with   Back Pain    Brief Narrative:  Patient is a 73 year old gentleman with history of paroxysmal A-fib on Eliquis , hypertension, hyperlipidemia, type 2 diabetes, metastatic bladder cancer status post cystoprostatectomy, total neurectomy and right nephroureterectomy and solitary left kidney status post ureterostomy currently on immunotherapy for bladder cancer recently admitted for AKI from 03/01/2024 to 03/02/2024 with mild to moderate hydronephrosis with stent retracted to the mid ureter on imaging whereby patient underwent stent exchange per urology with improvement with renal function. Patient presenting to the ED with complaint of severe low back pain with radiation to the right lower extremity causing difficulty ambulating and standing.  Patient also noted to have a complicated UTI placed on IV antibiotics and pancultured.  Blood cultures positive for Enterococcus faecalis.  ID consulted.  IR also consulted for evaluation for epidural steroid injection however recommended outpatient referral.  ID following, patient for TEE 04/02/2024.   Assessment & Plan:   Principal Problem:   Lumbar radiculopathy Active Problems:   Bacteremia   Bladder cancer (HCC)   Complicated UTI (urinary tract infection)   Paroxysmal A-fib (HCC)   Hyponatremia   CKD (chronic kidney disease), stage III (HCC)  #1 severe low back pain secondary to lumbar radiculopathy -Patient denied any bowel or urinary incontinence, no lower extremity weakness or sensory loss. - It is noted that ED physician discussed lumbar MRI results with on-call provider for neurosurgery who recommended consulting IR for epidural steroid injection. - Patient received IV Decadron  8 mg in the ED. - Eliquis  held. - IR consulted for epidural steroid injection and recommended outpatient  referral which has been placed per IR. - Patient with improvement with lower back pain and radiculopathy after being started on current pain regimen on 03/29/2024.   - Continue Lyrica .  - Continue scheduled Tylenol  1000 mg 3 times daily, IV Decadron  4 mg every 8 hours, Lidoderm  patch.   -Could start tapering IV Decadron  tomorrow. - IV Dilaudid  as needed for severe pain, oxycodone  as needed, Robaxin  as needed.   - Supportive care.   2.  Complicated UTI secondary to Enterococcus faecalis. -Urinalysis concerning for UTI. -Urine cultures with > 100,000 colonies of Enterococcus faecalis sensitive to ampicillin , Macrobid , vancomycin. -CT showed left ureteral stent called in the proximal left ureter with mild to moderate left-sided hydronephrosis.  Patient seen in consultation by urology who feel no acute urological intervention is indicated at this time however if patient becomes anuric, develops left flank pain or becomes febrile urology will need to be paged for consideration of stent exchange. -Will need outpatient follow-up with primary urologist. - Patient on IV Rocephin  and  IV ampicillin  due to bacteremia per ID.[name]  3.  Enterococcus faecalis bacteremia -Likely seeded from the urine. - Patient started on IV ampicillin . - Patient seen in consultation by ID who recommended discontinuation of IV cefepime  and placed patient on IV ampicillin  and IV Rocephin . 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   - ID recommended port removal, which was done per IR on 03/29/2024.   -Status post 1 dose of IV gentamicin  per ID due to history of Serratia.   -Repeat blood  cultures from 03/28/2024 with no growth to date. -Repeat blood cultures post port removal ordered, 04/01/2024, per ID with no growth to date.  -ID  recommending TEE which will be done today. -ID following and appreciate input and recommendations.  4.  Mild hyponatremia - Improved with hydration.   5.  CKD stage IIIa -Stable. -Urine output recorded of 1.8 L over the past 24 hours.  - Saline lock IV fluids.    6.  Paroxysmal A-fib -Continue home regimen metoprolol  for rate control. - Eliquis  for anticoagulation.  7.  Hypertension - Continue Toprol -XL.   8.  Hyperlipidemia - Continue Lipitor.   9.  Type 2 diabetes mellitus -Hemoglobin A1c 7.1 on 02/13/2024. -CBG at 175 this morning and patient on IV Decadron .  - Continue Semglee  to 15 units daily. - Continue meal coverage NovoLog  6 units 3 times daily with meals.  - SSI.   - Will likely need to adjust insulin  with steroid taper.  10.  Chronic anemia -Hemoglobin stable at 9.6.  11.  Metastatic bladder cancer -Status post cystoprostatectomy, total ureterectomy and right nephro ureterectomy and solitary left kidney status post ureterostomy. - Currently on treatment with immunotherapy. - Oncology was following and in agreement and okay with Port-A-Cath removal as recommended per ID due to bacteremia.  - Port-A-Cath removed per IR on 03/29/2024. - Outpatient follow-up with oncology.    DVT prophylaxis: Eliquis  Code Status: Full Family Communication: Updated patient.  No family at bedside. Disposition: TBD  Status is: Inpatient The patient will require care spanning > 2 midnights and should be moved to inpatient because: Severity of illness   Consultants:  Urology: Dr. Freddi Jaeger 03/28/2024 ID Dr. Zelda Hickman 03/28/2024 Oncology.  Procedures:  CT renal stone protocol 03/27/2024 2D echo 03/29/2024 Port-A-Cath explant per IR 03/29/2024  Antimicrobials:  Anti-infectives (From admission, onward)    Start     Dose/Rate Route Frequency Ordered Stop   03/28/24 1400  cefTRIAXone  (ROCEPHIN ) 2 g in sodium chloride  0.9 % 100 mL IVPB        2 g 200 mL/hr over 30 Minutes Intravenous Every 12  hours 03/28/24 1356     03/28/24 1400  gentamicin  (GARAMYCIN ) 450 mg in dextrose  5 % 50 mL IVPB        5 mg/kg  90.7 kg 122.5 mL/hr over 30 Minutes Intravenous  Once 03/28/24 1356 03/28/24 1650   03/28/24 1200  ampicillin  (OMNIPEN) 2 g in sodium chloride  0.9 % 100 mL IVPB        2 g 300 mL/hr over 20 Minutes Intravenous Every 4 hours 03/28/24 1015     03/28/24 1000  ampicillin  (OMNIPEN) 2 g in sodium chloride  0.9 % 100 mL IVPB  Status:  Discontinued        2 g 300 mL/hr over 20 Minutes Intravenous Every 4 hours 03/28/24 0952 03/28/24 1015   03/28/24 0600  ceFEPIme  (MAXIPIME ) 2 g in sodium chloride  0.9 % 100 mL IVPB  Status:  Discontinued        2 g 200 mL/hr over 30 Minutes Intravenous Every 8 hours 03/28/24 0330 03/28/24 1356   03/27/24 1715  cefTRIAXone  (ROCEPHIN ) 1 g in sodium chloride  0.9 % 100 mL IVPB        1 g 200 mL/hr over 30 Minutes Intravenous  Once 03/27/24 1701 03/27/24 1936         Subjective: Patient sleeping but easily arousable.  Denies any chest pain or shortness of breath.  Patient states lower back pain radiating to  the leg controlled on current regimen.  Awaiting TEE today.   Objective: Vitals:   04/01/24 0451 04/01/24 1337 04/01/24 1911 04/02/24 0502  BP: (!) 141/66 134/67 138/73 (!) 145/79  Pulse: (!) 54 (!) 59 67 73  Resp: 17  18 18   Temp: (!) 97.4 F (36.3 C) 98.3 F (36.8 C) 97.8 F (36.6 C) 98.3 F (36.8 C)  TempSrc: Oral  Oral   SpO2: 97% 100% 100% 97%  Weight:      Height:        Intake/Output Summary (Last 24 hours) at 04/02/2024 0754 Last data filed at 04/02/2024 0631 Gross per 24 hour  Intake --  Output 1800 ml  Net -1800 ml   Filed Weights   03/27/24 1327 03/27/24 1330  Weight: 90.7 kg 90.7 kg    Examination:  General exam: NAD.  Dressing over prior Port-A-Cath site.  Respiratory system: Lungs clear to auscultation bilaterally anterior lung fields.  No wheezes, no crackles, no rhonchi.  Fair air movement.  Speaking in full  sentences. Cardiovascular system: Regular rate rhythm no murmurs rubs or gallops.  No JVD.  No pitting lower extremity edema.  Gastrointestinal system: Manage soft, nontender, nondistended, positive bowel sounds.  No rebound.  No guarding.  Urostomy tube in place with Foley catheter bag attached with clear yellow urine.  Central nervous system: Alert and oriented. No focal neurological deficits. Extremities: Symmetric 5 x 5 power. Skin: No rashes, lesions or ulcers Psychiatry: Judgement and insight appear normal. Mood & affect appropriate.     Data Reviewed: I have personally reviewed following labs and imaging studies  CBC: Recent Labs  Lab 03/27/24 1444 03/28/24 0320 03/29/24 0448 03/30/24 1610 03/31/24 0625 04/01/24 0525 04/02/24 0531  WBC 13.4*   < > 11.5* 10.0 8.9 9.4 10.1  NEUTROABS 10.4*  --  8.9* 8.3*  --   --   --   HGB 10.6*   < > 9.4* 8.8* 9.1* 9.0* 9.6*  HCT 31.2*   < > 28.9* 27.1* 28.3* 27.9* 28.9*  MCV 83.6   < > 85.3 84.4 86.3 86.9 84.0  PLT 171   < > 158 151 153 151 175   < > = values in this interval not displayed.    Basic Metabolic Panel: Recent Labs  Lab 03/29/24 0448 03/30/24 0616 03/31/24 0625 04/01/24 0525 04/02/24 0531  NA 134* 132* 134* 132* 131*  K 3.8 4.6 4.9 4.7 4.9  CL 105 103 105 102 99  CO2 20* 23 22 22 24   GLUCOSE 146* 237* 213* 254* 183*  BUN 23 27* 29* 32* 24*  CREATININE 1.16 1.30* 1.21 1.30* 1.21  CALCIUM  8.7* 8.2* 8.1* 8.0* 8.1*  MG 1.8 2.0  --   --   --     GFR: Estimated Creatinine Clearance: 65 mL/min (by C-G formula based on SCr of 1.21 mg/dL).  Liver Function Tests: Recent Labs  Lab 03/27/24 1444  AST 19  ALT 13  ALKPHOS 81  BILITOT 0.9  PROT 7.8  ALBUMIN 3.5    CBG: Recent Labs  Lab 04/01/24 0739 04/01/24 1146 04/01/24 1627 04/01/24 2015 04/02/24 0722  GLUCAP 220* 313* 252* 162* 175*     Recent Results (from the past 240 hours)  Urine Culture (for pregnant, neutropenic or urologic patients or  patients with an indwelling urinary catheter)     Status: Abnormal   Collection Time: 03/27/24  2:06 PM   Specimen: Urine, Clean Catch  Result Value Ref Range Status  Specimen Description   Final    URINE, CLEAN CATCH Performed at [facility], [address], [place], [place] [address]    Special Requests   Final    NONE Performed at [facility], [address] [place], [place] [address]    Culture (A)  Final    >=100,000 COLONIES/mL ENTEROCOCCUS FAECALIS WITHIN MIXED ORGANISMS Performed at [facility], [address] [place], [place] [address]    Report Status 03/29/2024 FINAL  Final   Organism ID, Bacteria ENTEROCOCCUS FAECALIS (A)  Final      Susceptibility   Enterococcus faecalis - MIC*    AMPICILLIN  <=2 SENSITIVE Sensitive     NITROFURANTOIN  <=16 SENSITIVE Sensitive     VANCOMYCIN 2 SENSITIVE Sensitive     * >=100,000 COLONIES/mL ENTEROCOCCUS FAECALIS  Blood culture (routine x 2)     Status: Abnormal   Collection Time: 03/27/24  2:44 PM   Specimen: BLOOD LEFT ARM  Result Value Ref Range Status   Specimen Description   Final    BLOOD LEFT ARM Performed at [facility], [address], [place], [place] [address]    Special Requests   Final    BOTTLES DRAWN AEROBIC AND ANAEROBIC Blood Culture adequate volume Performed at [facility], [address] [place], [place] [address]    Culture  Setup Time   Final    GRAM POSITIVE COCCI IN CHAINS AEROBIC BOTTLE ONLY CRITICAL RESULT CALLED TO, READ BACK BY AND VERIFIED WITH: PHARMD Annmarie Basket on [id number] @0929  by SM Performed at [facility], [address] [place], [place] [address]    Culture ENTEROCOCCUS FAECALIS (A)  Final   Report Status 03/30/2024 FINAL  Final   Organism ID, Bacteria ENTEROCOCCUS FAECALIS  Final      Susceptibility   Enterococcus faecalis - MIC*    AMPICILLIN  <=2 SENSITIVE Sensitive     VANCOMYCIN 2 SENSITIVE Sensitive      GENTAMICIN  SYNERGY SENSITIVE Sensitive     * ENTEROCOCCUS FAECALIS  Blood culture (routine x 2)     Status: Abnormal   Collection Time: 03/27/24  2:44 PM   Specimen: BLOOD RIGHT ARM  Result Value Ref Range Status   Specimen Description   Final    BLOOD RIGHT ARM Performed at [facility], [address] [place], [place] [address]    Special Requests   Final    BOTTLES DRAWN AEROBIC AND ANAEROBIC Blood Culture results may not be optimal due to an inadequate volume of blood received in culture bottles Performed at [facility], [address], [place], [place] [address]    Culture  Setup Time   Final    GRAM POSITIVE COCCI IN BOTH AEROBIC AND ANAEROBIC BOTTLES CRITICAL VALUE NOTED.  VALUE IS CONSISTENT WITH PREVIOUSLY REPORTED AND CALLED VALUE.    Culture (A)  Final    ENTEROCOCCUS FAECALIS SUSCEPTIBILITIES PERFORMED ON PREVIOUS CULTURE WITHIN THE LAST 5 DAYS. Performed at [facility], [address] [place], [place] [address]    Report Status 03/30/2024 FINAL  Final  Blood Culture ID Panel (Reflexed)     Status: Abnormal   Collection Time: 03/27/24  2:44 PM  Result Value Ref Range Status   Enterococcus faecalis DETECTED (A) NOT DETECTED Final    Comment: CRITICAL RESULT CALLED TO, READ BACK BY AND VERIFIED WITH: PHARMD Justin L on [id number] @0929  by SM    Enterococcus Faecium  NOT DETECTED NOT DETECTED Final   Listeria monocytogenes NOT DETECTED NOT DETECTED Final   Staphylococcus species NOT DETECTED NOT DETECTED Final   Staphylococcus aureus (BCID) NOT DETECTED NOT DETECTED Final   Staphylococcus epidermidis NOT DETECTED NOT DETECTED Final   Staphylococcus lugdunensis NOT DETECTED NOT DETECTED Final   Streptococcus species NOT DETECTED NOT DETECTED Final   Streptococcus agalactiae NOT DETECTED NOT DETECTED Final   Streptococcus pneumoniae NOT DETECTED NOT DETECTED Final   Streptococcus pyogenes NOT DETECTED NOT DETECTED Final    A.calcoaceticus-baumannii NOT DETECTED NOT DETECTED Final   Bacteroides fragilis NOT DETECTED NOT DETECTED Final   Enterobacterales NOT DETECTED NOT DETECTED Final   Enterobacter cloacae complex NOT DETECTED NOT DETECTED Final   Escherichia coli NOT DETECTED NOT DETECTED Final   Klebsiella aerogenes NOT DETECTED NOT DETECTED Final   Klebsiella oxytoca NOT DETECTED NOT DETECTED Final   Klebsiella pneumoniae NOT DETECTED NOT DETECTED Final   Proteus species NOT DETECTED NOT DETECTED Final   Salmonella species NOT DETECTED NOT DETECTED Final   Serratia marcescens NOT DETECTED NOT DETECTED Final   Haemophilus influenzae NOT DETECTED NOT DETECTED Final   Neisseria meningitidis NOT DETECTED NOT DETECTED Final   Pseudomonas aeruginosa NOT DETECTED NOT DETECTED Final   Stenotrophomonas maltophilia NOT DETECTED NOT DETECTED Final   Candida albicans NOT DETECTED NOT DETECTED Final   Candida auris NOT DETECTED NOT DETECTED Final   Candida glabrata NOT DETECTED NOT DETECTED Final   Candida krusei NOT DETECTED NOT DETECTED Final   Candida parapsilosis NOT DETECTED NOT DETECTED Final   Candida tropicalis NOT DETECTED NOT DETECTED Final   Cryptococcus neoformans/gattii NOT DETECTED NOT DETECTED Final   Vancomycin resistance NOT DETECTED NOT DETECTED Final    Comment: Performed at [facility], [address] [place], [place] [address]  Culture, blood (Routine X 2) w Reflex to ID Panel     Status: None   Collection Time: 03/28/24  4:15 PM   Specimen: BLOOD  Result Value Ref Range Status   Specimen Description   Final    BLOOD SITE NOT SPECIFIED Performed at [facility], [address] [place], [place] [address]    Special Requests   Final    BOTTLES DRAWN AEROBIC AND ANAEROBIC Blood Culture adequate volume Performed at [facility], [address], [place], [place] [address]    Culture   Final    NO GROWTH 5 DAYS Performed at [facility], [address], [place], [place] [address]    Report Status 04/02/2024 FINAL  Final  Culture, blood (Routine X 2) w Reflex to ID Panel     Status: None   Collection Time: 03/28/24  4:24 PM   Specimen: BLOOD  Result Value Ref Range Status   Specimen Description   Final    BLOOD SITE NOT SPECIFIED Performed at [facility], [address], [place], [place] [address]    Special Requests   Final    BOTTLES DRAWN AEROBIC AND ANAEROBIC Blood Culture adequate volume Performed at [facility], [address] [place], [place] [address]    Culture   Final    NO GROWTH 5 DAYS Performed at [facility], [address], [place], [place] [address]    Report Status 04/02/2024 FINAL  Final  Culture, blood (Routine X 2) w Reflex to ID Panel     Status: None (Preliminary result)   Collection Time: 04/01/24 12:06 PM  Specimen: BLOOD LEFT ARM  Result Value Ref Range Status   Specimen Description   Final    BLOOD LEFT ARM Performed at [facility], [address], [place], [place] [address]    Special Requests   Final    BOTTLES DRAWN AEROBIC AND ANAEROBIC Blood Culture adequate volume Performed at [facility], [address], [place], [place] [address]    Culture   Final    NO GROWTH < 24 HOURS Performed at [facility], [address], [place], [place] [address]    Report Status PENDING  Incomplete  Culture, blood (Routine X 2) w Reflex to ID Panel     Status: None (Preliminary result)   Collection Time: 04/01/24 12:06 PM   Specimen: BLOOD RIGHT ARM  Result Value Ref Range Status   Specimen Description   Final    BLOOD RIGHT ARM Performed at [facility], [address] [place], [place] [address]    Special Requests   Final    BOTTLES DRAWN AEROBIC AND ANAEROBIC Blood Culture results may not be optimal due to an inadequate volume of blood received in culture bottles Performed at [facility], [address] [place], [place] [address]    Culture   Final    NO GROWTH < 24 HOURS Performed at [facility], [address] [place], [place] [address]    Report Status PENDING  Incomplete         Radiology Studies: No results found.       Scheduled Meds:  acetaminophen   1,000 mg Oral TID   apixaban   5 mg Oral BID   atorvastatin   40 mg Oral QHS   dexamethasone  (DECADRON ) injection  4 mg Intravenous Q8H   insulin  aspart  0-15 Units Subcutaneous TID WC   insulin  aspart  0-5 Units Subcutaneous QHS   insulin  aspart  6 Units Subcutaneous TID WC   insulin  glargine-yfgn  15 Units Subcutaneous Daily   lidocaine   1 patch Transdermal Q24H   lidocaine -EPINEPHrine   20 mL Intradermal Once   metoprolol  succinate  25 mg Oral QHS   polyethylene glycol  17 g Oral Daily   pregabalin   75 mg Oral Daily   senna-docusate  1 tablet Oral BID   Continuous Infusions:  ampicillin  (OMNIPEN) IV 2 g (04/02/24 0528)   cefTRIAXone  (ROCEPHIN )  IV 2 g (04/01/24 2113)     LOS: 5 days    Time spent: 35 minutes    Hilda Lovings, MD [facility]   To contact the attending provider between 7A-7P or the covering provider during after hours 7P-7A, please log into the web site [email or web address] and access using universal [place] password for that web site. If you do not have the password, please call the hospital operator.  04/02/2024, 7:54 AM

## 2024-04-02 NOTE — Plan of Care (Signed)
  Problem: Coping: Goal: Ability to adjust to condition or change in health will improve Outcome: Progressing   Problem: Metabolic: Goal: Ability to maintain appropriate glucose levels will improve Outcome: Progressing   Problem: Tissue Perfusion: Goal: Adequacy of tissue perfusion will improve Outcome: Progressing   Problem: Education: Goal: Knowledge of General Education information will improve Description: Including pain rating scale, medication(s)/side effects and non-pharmacologic comfort measures Outcome: Progressing   Problem: Nutrition: Goal: Adequate nutrition will be maintained Outcome: Progressing   Problem: Pain Managment: Goal: General experience of comfort will improve and/or be controlled Outcome: Progressing   Problem: Safety: Goal: Ability to remain free from injury will improve Outcome: Progressing   Problem: Skin Integrity: Goal: Risk for impaired skin integrity will decrease Outcome: Progressing

## 2024-04-02 NOTE — Progress Notes (Signed)
 PROGRESS NOTE    Robert [name]  QIH:[id number] DOB: Jan 19, 1951 DOA: 03/27/2024 PCP: Genia Kettering, MD   Chief Complaint  Patient presents with   Back Pain    Brief Narrative:  Patient is a 73 year old gentleman with history of paroxysmal A-fib on Eliquis , hypertension, hyperlipidemia, type 2 diabetes, metastatic bladder cancer status post cystoprostatectomy, total neurectomy and right nephroureterectomy and solitary left kidney status post ureterostomy currently on immunotherapy for bladder cancer recently admitted for AKI from 03/01/2024 to 03/02/2024 with mild to moderate hydronephrosis with stent retracted to the mid ureter on imaging whereby patient underwent stent exchange per urology with improvement with renal function. Patient presenting to the ED with complaint of severe low back pain with radiation to the right lower extremity causing difficulty ambulating and standing.  Patient also noted to have a complicated UTI placed on IV antibiotics and pancultured.  Blood cultures positive for Enterococcus faecalis.  ID consulted.  IR also consulted for evaluation for epidural steroid injection however recommended outpatient referral.  ID following, patient for TEE 04/02/2024.   Assessment & Plan:   Principal Problem:   Lumbar radiculopathy Active Problems:   Bacteremia   Bladder cancer (HCC)   Complicated UTI (urinary tract infection)   Paroxysmal A-fib (HCC)   Hyponatremia   CKD (chronic kidney disease), stage III (HCC)  #1 severe low back pain secondary to lumbar radiculopathy -Patient denied any bowel or urinary incontinence, no lower extremity weakness or sensory loss. - It is noted that ED physician discussed lumbar MRI results with on-call provider for neurosurgery who recommended consulting IR for epidural steroid injection. - Patient received IV Decadron  8 mg in the ED. - Eliquis  held. - IR consulted for epidural steroid injection and recommended outpatient  referral which has been placed per IR. - Patient with improvement with lower back pain and radiculopathy after being started on current pain regimen on 03/29/2024.   - Continue Lyrica .  - Continue scheduled Tylenol  1000 mg 3 times daily, IV Decadron  4 mg every 8 hours, Lidoderm  patch.   -Could start tapering IV Decadron  tomorrow. - IV Dilaudid  as needed for severe pain, oxycodone  as needed, Robaxin  as needed.   - Supportive care.   2.  Complicated UTI secondary to Enterococcus faecalis. -Urinalysis concerning for UTI. -Urine cultures with > 100,000 colonies of Enterococcus faecalis sensitive to ampicillin , Macrobid , vancomycin. -CT showed left ureteral stent called in the proximal left ureter with mild to moderate left-sided hydronephrosis.  Patient seen in consultation by urology who feel no acute urological intervention is indicated at this time however if patient becomes anuric, develops left flank pain or becomes febrile urology will need to be paged for consideration of stent exchange. -Will need outpatient follow-up with primary urologist. - Patient on IV Rocephin  and  IV ampicillin  due to bacteremia per ID.[name]  3.  Enterococcus faecalis bacteremia -Likely seeded from the urine. - Patient started on IV ampicillin . - Patient seen in consultation by ID who recommended discontinuation of IV cefepime  and placed patient on IV ampicillin  and IV Rocephin . 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   - ID recommended port removal, which was done per IR on 03/29/2024.   -Status post 1 dose of IV gentamicin  per ID due to history of Serratia.   -Repeat blood  cultures from 03/28/2024 with no growth to date. -Repeat blood cultures post port removal ordered, 04/01/2024, per ID with no growth to date.  -ID  recommending TEE which will be done today. -ID following and appreciate input and recommendations.  4.  Mild hyponatremia - Improved with hydration.   5.  CKD stage IIIa -Stable. -Urine output recorded of 1.8 L over the past 24 hours.  - Saline lock IV fluids.    6.  Paroxysmal A-fib -Continue home regimen metoprolol  for rate control. - Eliquis  for anticoagulation.  7.  Hypertension - Continue Toprol -XL.   8.  Hyperlipidemia - Continue Lipitor.   9.  Type 2 diabetes mellitus -Hemoglobin A1c 7.1 on 02/13/2024. -CBG at 175 this morning and patient on IV Decadron .  - Continue Semglee  to 15 units daily. - Continue meal coverage NovoLog  6 units 3 times daily with meals.  - SSI.   - Will likely need to adjust insulin  with steroid taper.  10.  Chronic anemia -Hemoglobin stable at 9.6.  11.  Metastatic bladder cancer -Status post cystoprostatectomy, total ureterectomy and right nephro ureterectomy and solitary left kidney status post ureterostomy. - Currently on treatment with immunotherapy. - Oncology was following and in agreement and okay with Port-A-Cath removal as recommended per ID due to bacteremia.  - Port-A-Cath removed per IR on 03/29/2024. - Outpatient follow-up with oncology.    DVT prophylaxis: Eliquis  Code Status: Full Family Communication: Updated patient.  No family at bedside. Disposition: TBD  Status is: Inpatient The patient will require care spanning > 2 midnights and should be moved to inpatient because: Severity of illness   Consultants:  Urology: Dr. Freddi Jaeger 03/28/2024 ID Dr. Zelda Hickman 03/28/2024 Oncology.  Procedures:  CT renal stone protocol 03/27/2024 2D echo 03/29/2024 Port-A-Cath explant per IR 03/29/2024  Antimicrobials:  Anti-infectives (From admission, onward)    Start     Dose/Rate Route Frequency Ordered Stop   03/28/24 1400  cefTRIAXone  (ROCEPHIN ) 2 g in sodium chloride  0.9 % 100 mL IVPB        2 g 200 mL/hr over 30 Minutes Intravenous Every 12  hours 03/28/24 1356     03/28/24 1400  gentamicin  (GARAMYCIN ) 450 mg in dextrose  5 % 50 mL IVPB        5 mg/kg  90.7 kg 122.5 mL/hr over 30 Minutes Intravenous  Once 03/28/24 1356 03/28/24 1650   03/28/24 1200  ampicillin  (OMNIPEN) 2 g in sodium chloride  0.9 % 100 mL IVPB        2 g 300 mL/hr over 20 Minutes Intravenous Every 4 hours 03/28/24 1015     03/28/24 1000  ampicillin  (OMNIPEN) 2 g in sodium chloride  0.9 % 100 mL IVPB  Status:  Discontinued        2 g 300 mL/hr over 20 Minutes Intravenous Every 4 hours 03/28/24 0952 03/28/24 1015   03/28/24 0600  ceFEPIme  (MAXIPIME ) 2 g in sodium chloride  0.9 % 100 mL IVPB  Status:  Discontinued        2 g 200 mL/hr over 30 Minutes Intravenous Every 8 hours 03/28/24 0330 03/28/24 1356   03/27/24 1715  cefTRIAXone  (ROCEPHIN ) 1 g in sodium chloride  0.9 % 100 mL IVPB        1 g 200 mL/hr over 30 Minutes Intravenous  Once 03/27/24 1701 03/27/24 1936         Subjective: Patient sleeping but easily arousable.  Denies any chest pain or shortness of breath.  Patient states lower back pain radiating to  the leg controlled on current regimen.  Awaiting TEE today.   Objective: Vitals:   04/01/24 0451 04/01/24 1337 04/01/24 1911 04/02/24 0502  BP: (!) 141/66 134/67 138/73 (!) 145/79  Pulse: (!) 54 (!) 59 67 73  Resp: 17  18 18   Temp: (!) 97.4 F (36.3 C) 98.3 F (36.8 C) 97.8 F (36.6 C) 98.3 F (36.8 C)  TempSrc: Oral  Oral   SpO2: 97% 100% 100% 97%  Weight:      Height:        Intake/Output Summary (Last 24 hours) at 04/02/2024 0754 Last data filed at 04/02/2024 0631 Gross per 24 hour  Intake --  Output 1800 ml  Net -1800 ml   Filed Weights   03/27/24 1327 03/27/24 1330  Weight: 90.7 kg 90.7 kg    Examination:  General exam: NAD.  Dressing over prior Port-A-Cath site.  Respiratory system: Lungs clear to auscultation bilaterally anterior lung fields.  No wheezes, no crackles, no rhonchi.  Fair air movement.  Speaking in full  sentences. Cardiovascular system: Regular rate rhythm no murmurs rubs or gallops.  No JVD.  No pitting lower extremity edema.  Gastrointestinal system: Manage soft, nontender, nondistended, positive bowel sounds.  No rebound.  No guarding.  Urostomy tube in place with Foley catheter bag attached with clear yellow urine.  Central nervous system: Alert and oriented. No focal neurological deficits. Extremities: Symmetric 5 x 5 power. Skin: No rashes, lesions or ulcers Psychiatry: Judgement and insight appear normal. Mood & affect appropriate.     Data Reviewed: I have personally reviewed following labs and imaging studies  CBC: Recent Labs  Lab 03/27/24 1444 03/28/24 0320 03/29/24 0448 03/30/24 1610 03/31/24 0625 04/01/24 0525 04/02/24 0531  WBC 13.4*   < > 11.5* 10.0 8.9 9.4 10.1  NEUTROABS 10.4*  --  8.9* 8.3*  --   --   --   HGB 10.6*   < > 9.4* 8.8* 9.1* 9.0* 9.6*  HCT 31.2*   < > 28.9* 27.1* 28.3* 27.9* 28.9*  MCV 83.6   < > 85.3 84.4 86.3 86.9 84.0  PLT 171   < > 158 151 153 151 175   < > = values in this interval not displayed.    Basic Metabolic Panel: Recent Labs  Lab 03/29/24 0448 03/30/24 0616 03/31/24 0625 04/01/24 0525 04/02/24 0531  NA 134* 132* 134* 132* 131*  K 3.8 4.6 4.9 4.7 4.9  CL 105 103 105 102 99  CO2 20* 23 22 22 24   GLUCOSE 146* 237* 213* 254* 183*  BUN 23 27* 29* 32* 24*  CREATININE 1.16 1.30* 1.21 1.30* 1.21  CALCIUM  8.7* 8.2* 8.1* 8.0* 8.1*  MG 1.8 2.0  --   --   --     GFR: Estimated Creatinine Clearance: 65 mL/min (by C-G formula based on SCr of 1.21 mg/dL).  Liver Function Tests: Recent Labs  Lab 03/27/24 1444  AST 19  ALT 13  ALKPHOS 81  BILITOT 0.9  PROT 7.8  ALBUMIN 3.5    CBG: Recent Labs  Lab 04/01/24 0739 04/01/24 1146 04/01/24 1627 04/01/24 2015 04/02/24 0722  GLUCAP 220* 313* 252* 162* 175*     Recent Results (from the past 240 hours)  Urine Culture (for pregnant, neutropenic or urologic patients or  patients with an indwelling urinary catheter)     Status: Abnormal   Collection Time: 03/27/24  2:06 PM   Specimen: Urine, Clean Catch  Result Value Ref Range Status  Specimen Description   Final    URINE, CLEAN CATCH Performed at [facility], [address], [place], [place] [address]    Special Requests   Final    NONE Performed at [facility], [address] [place], [place] [address]    Culture (A)  Final    >=100,000 COLONIES/mL ENTEROCOCCUS FAECALIS WITHIN MIXED ORGANISMS Performed at [facility], [address] [place], [place] [address]    Report Status 03/29/2024 FINAL  Final   Organism ID, Bacteria ENTEROCOCCUS FAECALIS (A)  Final      Susceptibility   Enterococcus faecalis - MIC*    AMPICILLIN  <=2 SENSITIVE Sensitive     NITROFURANTOIN  <=16 SENSITIVE Sensitive     VANCOMYCIN 2 SENSITIVE Sensitive     * >=100,000 COLONIES/mL ENTEROCOCCUS FAECALIS  Blood culture (routine x 2)     Status: Abnormal   Collection Time: 03/27/24  2:44 PM   Specimen: BLOOD LEFT ARM  Result Value Ref Range Status   Specimen Description   Final    BLOOD LEFT ARM Performed at [facility], [address], [place], [place] [address]    Special Requests   Final    BOTTLES DRAWN AEROBIC AND ANAEROBIC Blood Culture adequate volume Performed at [facility], [address] [place], [place] [address]    Culture  Setup Time   Final    GRAM POSITIVE COCCI IN CHAINS AEROBIC BOTTLE ONLY CRITICAL RESULT CALLED TO, READ BACK BY AND VERIFIED WITH: PHARMD Annmarie Basket on [id number] @0929  by SM Performed at [facility], [address] [place], [place] [address]    Culture ENTEROCOCCUS FAECALIS (A)  Final   Report Status 03/30/2024 FINAL  Final   Organism ID, Bacteria ENTEROCOCCUS FAECALIS  Final      Susceptibility   Enterococcus faecalis - MIC*    AMPICILLIN  <=2 SENSITIVE Sensitive     VANCOMYCIN 2 SENSITIVE Sensitive      GENTAMICIN  SYNERGY SENSITIVE Sensitive     * ENTEROCOCCUS FAECALIS  Blood culture (routine x 2)     Status: Abnormal   Collection Time: 03/27/24  2:44 PM   Specimen: BLOOD RIGHT ARM  Result Value Ref Range Status   Specimen Description   Final    BLOOD RIGHT ARM Performed at [facility], [address] [place], [place] [address]    Special Requests   Final    BOTTLES DRAWN AEROBIC AND ANAEROBIC Blood Culture results may not be optimal due to an inadequate volume of blood received in culture bottles Performed at [facility], [address], [place], [place] [address]    Culture  Setup Time   Final    GRAM POSITIVE COCCI IN BOTH AEROBIC AND ANAEROBIC BOTTLES CRITICAL VALUE NOTED.  VALUE IS CONSISTENT WITH PREVIOUSLY REPORTED AND CALLED VALUE.    Culture (A)  Final    ENTEROCOCCUS FAECALIS SUSCEPTIBILITIES PERFORMED ON PREVIOUS CULTURE WITHIN THE LAST 5 DAYS. Performed at [facility], [address] [place], [place] [address]    Report Status 03/30/2024 FINAL  Final  Blood Culture ID Panel (Reflexed)     Status: Abnormal   Collection Time: 03/27/24  2:44 PM  Result Value Ref Range Status   Enterococcus faecalis DETECTED (A) NOT DETECTED Final    Comment: CRITICAL RESULT CALLED TO, READ BACK BY AND VERIFIED WITH: PHARMD Justin L on [id number] @0929  by SM    Enterococcus Faecium  NOT DETECTED NOT DETECTED Final   Listeria monocytogenes NOT DETECTED NOT DETECTED Final   Staphylococcus species NOT DETECTED NOT DETECTED Final   Staphylococcus aureus (BCID) NOT DETECTED NOT DETECTED Final   Staphylococcus epidermidis NOT DETECTED NOT DETECTED Final   Staphylococcus lugdunensis NOT DETECTED NOT DETECTED Final   Streptococcus species NOT DETECTED NOT DETECTED Final   Streptococcus agalactiae NOT DETECTED NOT DETECTED Final   Streptococcus pneumoniae NOT DETECTED NOT DETECTED Final   Streptococcus pyogenes NOT DETECTED NOT DETECTED Final    A.calcoaceticus-baumannii NOT DETECTED NOT DETECTED Final   Bacteroides fragilis NOT DETECTED NOT DETECTED Final   Enterobacterales NOT DETECTED NOT DETECTED Final   Enterobacter cloacae complex NOT DETECTED NOT DETECTED Final   Escherichia coli NOT DETECTED NOT DETECTED Final   Klebsiella aerogenes NOT DETECTED NOT DETECTED Final   Klebsiella oxytoca NOT DETECTED NOT DETECTED Final   Klebsiella pneumoniae NOT DETECTED NOT DETECTED Final   Proteus species NOT DETECTED NOT DETECTED Final   Salmonella species NOT DETECTED NOT DETECTED Final   Serratia marcescens NOT DETECTED NOT DETECTED Final   Haemophilus influenzae NOT DETECTED NOT DETECTED Final   Neisseria meningitidis NOT DETECTED NOT DETECTED Final   Pseudomonas aeruginosa NOT DETECTED NOT DETECTED Final   Stenotrophomonas maltophilia NOT DETECTED NOT DETECTED Final   Candida albicans NOT DETECTED NOT DETECTED Final   Candida auris NOT DETECTED NOT DETECTED Final   Candida glabrata NOT DETECTED NOT DETECTED Final   Candida krusei NOT DETECTED NOT DETECTED Final   Candida parapsilosis NOT DETECTED NOT DETECTED Final   Candida tropicalis NOT DETECTED NOT DETECTED Final   Cryptococcus neoformans/gattii NOT DETECTED NOT DETECTED Final   Vancomycin resistance NOT DETECTED NOT DETECTED Final    Comment: Performed at [facility], [address] [place], [place] [address]  Culture, blood (Routine X 2) w Reflex to ID Panel     Status: None   Collection Time: 03/28/24  4:15 PM   Specimen: BLOOD  Result Value Ref Range Status   Specimen Description   Final    BLOOD SITE NOT SPECIFIED Performed at [facility], [address] [place], [place] [address]    Special Requests   Final    BOTTLES DRAWN AEROBIC AND ANAEROBIC Blood Culture adequate volume Performed at [facility], [address], [place], [place] [address]    Culture   Final    NO GROWTH 5 DAYS Performed at [facility], [address], [place], [place] [address]    Report Status 04/02/2024 FINAL  Final  Culture, blood (Routine X 2) w Reflex to ID Panel     Status: None   Collection Time: 03/28/24  4:24 PM   Specimen: BLOOD  Result Value Ref Range Status   Specimen Description   Final    BLOOD SITE NOT SPECIFIED Performed at [facility], [address], [place], [place] [address]    Special Requests   Final    BOTTLES DRAWN AEROBIC AND ANAEROBIC Blood Culture adequate volume Performed at [facility], [address] [place], [place] [address]    Culture   Final    NO GROWTH 5 DAYS Performed at [facility], [address], [place], [place] [address]    Report Status 04/02/2024 FINAL  Final  Culture, blood (Routine X 2) w Reflex to ID Panel     Status: None (Preliminary result)   Collection Time: 04/01/24 12:06 PM  Specimen: BLOOD LEFT ARM  Result Value Ref Range Status   Specimen Description   Final    BLOOD LEFT ARM Performed at [facility], [address], [place], [place] [address]    Special Requests   Final    BOTTLES DRAWN AEROBIC AND ANAEROBIC Blood Culture adequate volume Performed at [facility], [address], [place], [place] [address]    Culture   Final    NO GROWTH < 24 HOURS Performed at [facility], [address], [place], [place] [address]    Report Status PENDING  Incomplete  Culture, blood (Routine X 2) w Reflex to ID Panel     Status: None (Preliminary result)   Collection Time: 04/01/24 12:06 PM   Specimen: BLOOD RIGHT ARM  Result Value Ref Range Status   Specimen Description   Final    BLOOD RIGHT ARM Performed at [facility], [address] [place], [place] [address]    Special Requests   Final    BOTTLES DRAWN AEROBIC AND ANAEROBIC Blood Culture results may not be optimal due to an inadequate volume of blood received in culture bottles Performed at [facility], [address] [place], [place] [address]    Culture   Final    NO GROWTH < 24 HOURS Performed at [facility], [address] [place], [place] [address]    Report Status PENDING  Incomplete         Radiology Studies: No results found.       Scheduled Meds:  acetaminophen   1,000 mg Oral TID   apixaban   5 mg Oral BID   atorvastatin   40 mg Oral QHS   dexamethasone  (DECADRON ) injection  4 mg Intravenous Q8H   insulin  aspart  0-15 Units Subcutaneous TID WC   insulin  aspart  0-5 Units Subcutaneous QHS   insulin  aspart  6 Units Subcutaneous TID WC   insulin  glargine-yfgn  15 Units Subcutaneous Daily   lidocaine   1 patch Transdermal Q24H   lidocaine -EPINEPHrine   20 mL Intradermal Once   metoprolol  succinate  25 mg Oral QHS   polyethylene glycol  17 g Oral Daily   pregabalin   75 mg Oral Daily   senna-docusate  1 tablet Oral BID   Continuous Infusions:  ampicillin  (OMNIPEN) IV 2 g (04/02/24 0528)   cefTRIAXone  (ROCEPHIN )  IV 2 g (04/01/24 2113)     LOS: 5 days    Time spent: 35 minutes    Hilda Lovings, MD [facility]   To contact the attending provider between 7A-7P or the covering provider during after hours 7P-7A, please log into the web site [email or web address] and access using universal [place] password for that web site. If you do not have the password, please call the hospital operator.  04/02/2024, 7:54 AM

## 2024-04-02 NOTE — Anesthesia Preprocedure Evaluation (Signed)
 Anesthesia Evaluation  Patient identified by MRN, date of birth, ID band Patient awake    Reviewed: Allergy & Precautions, NPO status , Patient's Chart, lab work & pertinent test results, reviewed documented beta blocker date and time   Airway Mallampati: II  TM Distance: >3 FB     Dental  (+) Teeth Intact, Caps, Dental Advisory Given   Pulmonary Current Smoker and Patient abstained from smoking.   Pulmonary exam normal breath sounds clear to auscultation       Cardiovascular hypertension, Pt. on medications and Pt. on home beta blockers Normal cardiovascular exam+ dysrhythmias Atrial Fibrillation  Rhythm:Regular Rate:Normal     Neuro/Psych  Neuromuscular disease  negative psych ROS   GI/Hepatic negative GI ROS, Neg liver ROS,,,  Endo/Other  diabetes, Well Controlled, Type 2, Oral Hypoglycemic Agents  GLP-1 RA therapy- last dose 6/1  Renal/GU Renal disease   Hx/o Bladder Ca S/P ChemoRx , now on immunotherapy    Musculoskeletal  (+) Arthritis , Osteoarthritis,  Low back pain with radiculopathy DDD lumbar spine   Abdominal   Peds  Hematology Bacteremia Eliquis  therapy- last dose   Anesthesia Other Findings   Reproductive/Obstetrics                              Anesthesia Physical Anesthesia Plan  ASA: 3  Anesthesia Plan: MAC   Post-op Pain Management: Minimal or no pain anticipated   Induction: Intravenous  PONV Risk Score and Plan: Treatment may vary due to age or medical condition and Propofol  infusion  Airway Management Planned: Natural Airway and Nasal Cannula  Additional Equipment: None  Intra-op Plan:   Post-operative Plan: Extubation in OR  Informed Consent: I have reviewed the patients History and Physical, chart, labs and discussed the procedure including the risks, benefits and alternatives for the proposed anesthesia with the patient or authorized representative who  has indicated his/her understanding and acceptance.     Dental advisory given  Plan Discussed with: CRNA and Anesthesiologist  Anesthesia Plan Comments:          Anesthesia Quick Evaluation

## 2024-04-02 NOTE — Transfer of Care (Signed)
 Immediate Anesthesia Transfer of Care Note  Patient: Robert [name]  Procedure(s) Performed: TRANSESOPHAGEAL ECHOCARDIOGRAM  Patient Location: PACU  Anesthesia Type:MAC  Level of Consciousness: drowsy, patient cooperative, and responds to stimulation  Airway & Oxygen Therapy: Patient Spontanous Breathing and Patient connected to nasal cannula oxygen  Post-op Assessment: Report given to RN and Post -op Vital signs reviewed and stable  Post vital signs: Reviewed and stable  Last Vitals:  Vitals Value Taken Time  BP    Temp    Pulse    Resp    SpO2      Last Pain:  Vitals:   04/02/24 1043  TempSrc:   PainSc: 0-No pain      Patients Stated Pain Goal: 0 (03/30/24 0108)  Complications: No notable events documented.

## 2024-04-02 NOTE — Plan of Care (Signed)
  Problem: Education: Goal: Knowledge of the procedure and recovery process will improve Outcome: Progressing   Problem: Pain Management: Goal: General experience of comfort will improve Outcome: Progressing   Problem: Skin Integrity: Goal: Demonstration of wound healing without infection will improve Outcome: Progressing   Problem: Activity: Goal: Risk for activity intolerance will decrease Outcome: Progressing   Problem: Coping: Goal: Level of anxiety will decrease Outcome: Progressing   Problem: Safety: Goal: Ability to remain free from injury will improve Outcome: Progressing   Problem: Skin Integrity: Goal: Risk for impaired skin integrity will decrease Outcome: Progressing

## 2024-04-02 NOTE — Interval H&P Note (Signed)
 History and Physical Interval Note:  04/02/2024 9:44 AM  Robert [name]  has presented today for surgery, with the diagnosis of bacteremia.  The various methods of treatment have been discussed with the patient and family. After consideration of risks, benefits and other options for treatment, the patient has consented to  Procedure(s): TRANSESOPHAGEAL ECHOCARDIOGRAM (N/A) as a surgical intervention.  The patient's history has been reviewed, patient examined, no change in status, stable for surgery.  I have reviewed the patient's chart and labs.  Questions were answered to the patient's satisfaction.     Janelle Mediate

## 2024-04-02 NOTE — Progress Notes (Signed)
 Report given to [facility] RN, by this RN. [facility] called to set up transport for Pt's return to [facility] 1519.

## 2024-04-02 NOTE — Anesthesia Postprocedure Evaluation (Signed)
 Anesthesia Post Note  Patient: Robert [name]  Procedure(s) Performed: TRANSESOPHAGEAL ECHOCARDIOGRAM     Patient location during evaluation: PACU Anesthesia Type: MAC Level of consciousness: awake and alert Pain management: pain level controlled Vital Signs Assessment: post-procedure vital signs reviewed and stable Respiratory status: spontaneous breathing, nonlabored ventilation and respiratory function stable Cardiovascular status: stable and blood pressure returned to baseline Postop Assessment: no apparent nausea or vomiting Anesthetic complications: no   No notable events documented.  Last Vitals:  Vitals:   04/02/24 1130 04/02/24 1145  BP: 127/83 (!) 161/73  Pulse: 64 63  Resp: 20 20  Temp:    SpO2: 98% (!) 89%    Last Pain:  Vitals:   04/02/24 1110  TempSrc:   PainSc: 0-No pain                 Amara Manalang A.

## 2024-04-02 NOTE — Progress Notes (Signed)
  Echocardiogram Echocardiogram Transesophageal has been performed.  Farley Honer, RDCS 04/02/2024, 11:18 AM

## 2024-04-03 ENCOUNTER — Encounter: Payer: Self-pay | Admitting: Hematology

## 2024-04-03 ENCOUNTER — Other Ambulatory Visit (HOSPITAL_COMMUNITY): Payer: Self-pay

## 2024-04-03 ENCOUNTER — Telehealth (HOSPITAL_COMMUNITY): Payer: Self-pay | Admitting: Pharmacy Technician

## 2024-04-03 DIAGNOSIS — N390 Urinary tract infection, site not specified: Secondary | ICD-10-CM | POA: Diagnosis not present

## 2024-04-03 DIAGNOSIS — R7881 Bacteremia: Secondary | ICD-10-CM | POA: Diagnosis not present

## 2024-04-03 DIAGNOSIS — M5416 Radiculopathy, lumbar region: Secondary | ICD-10-CM | POA: Diagnosis not present

## 2024-04-03 DIAGNOSIS — B952 Enterococcus as the cause of diseases classified elsewhere: Secondary | ICD-10-CM | POA: Diagnosis not present

## 2024-04-03 LAB — CBC
HCT: 31.6 % — ABNORMAL LOW (ref 39.0–52.0)
Hemoglobin: 10.3 g/dL — ABNORMAL LOW (ref 13.0–17.0)
MCH: 28 pg (ref 26.0–34.0)
MCHC: 32.6 g/dL (ref 30.0–36.0)
MCV: 85.9 fL (ref 80.0–100.0)
Platelets: 195 10*3/uL (ref 150–400)
RBC: 3.68 MIL/uL — ABNORMAL LOW (ref 4.22–5.81)
RDW: 14.4 % (ref 11.5–15.5)
WBC: 10.6 10*3/uL — ABNORMAL HIGH (ref 4.0–10.5)
nRBC: 0 % (ref 0.0–0.2)

## 2024-04-03 LAB — BASIC METABOLIC PANEL WITH GFR
Anion gap: 7 (ref 5–15)
BUN: 27 mg/dL — ABNORMAL HIGH (ref 8–23)
CO2: 26 mmol/L (ref 22–32)
Calcium: 8.2 mg/dL — ABNORMAL LOW (ref 8.9–10.3)
Chloride: 98 mmol/L (ref 98–111)
Creatinine, Ser: 1.23 mg/dL (ref 0.61–1.24)
GFR, Estimated: >60 mL/min (ref >60)
Glucose, Bld: 232 mg/dL — ABNORMAL HIGH (ref 70–99)
Potassium: 4.9 mmol/L (ref 3.5–5.1)
Sodium: 131 mmol/L — ABNORMAL LOW (ref 135–145)

## 2024-04-03 LAB — GLUCOSE, CAPILLARY
Glucose-Capillary: 161 mg/dL — ABNORMAL HIGH (ref 70–99)
Glucose-Capillary: 252 mg/dL — ABNORMAL HIGH (ref 70–99)
Glucose-Capillary: 350 mg/dL — ABNORMAL HIGH (ref 70–99)

## 2024-04-03 MED ORDER — AMOXICILLIN 500 MG PO CAPS
1000.0000 mg | ORAL_CAPSULE | Freq: Three times a day (TID) | ORAL | Status: DC
  Filled 2024-04-03: qty 2

## 2024-04-03 MED ORDER — PREGABALIN 75 MG PO CAPS: 75.0000 mg | ORAL_CAPSULE | Freq: Every day | ORAL | 0 refills | Status: DC

## 2024-04-03 MED ORDER — LIDOCAINE 5 % EX PTCH: 1.0000 | MEDICATED_PATCH | CUTANEOUS | 0 refills | Status: AC

## 2024-04-03 MED ORDER — METHOCARBAMOL 500 MG PO TABS
500.0000 mg | ORAL_TABLET | Freq: Three times a day (TID) | ORAL | 0 refills | Status: AC | PRN
Start: 2024-04-03 — End: ?

## 2024-04-03 MED ORDER — OXYCODONE HCL 5 MG PO TABS
5.0000 mg | ORAL_TABLET | Freq: Four times a day (QID) | ORAL | 0 refills | Status: DC | PRN
  Filled 2024-04-03: qty 14, 4d supply, fill #0

## 2024-04-03 MED ORDER — AMOXICILLIN 500 MG PO CAPS: 1000.0000 mg | ORAL_CAPSULE | Freq: Three times a day (TID) | ORAL | 0 refills | Status: DC

## 2024-04-03 MED ORDER — METHYLPREDNISOLONE 4 MG PO TBPK: ORAL_TABLET | ORAL | 0 refills | Status: DC

## 2024-04-03 NOTE — Discharge Summary (Addendum)
 Physician Discharge Summary   Patient: Robert [name] MRN: [id number] DOB: 1951/09/15  Admit date:     03/27/2024  Discharge date: 04/03/24  Discharge Physician: [name]   PCP: Genia Kettering, MD   Recommendations at discharge:    Pt to be discharged home.   If you experience worsening fever, chills, chest pain, shortness of breath, or other concerning symptoms, please call your PCP or go to the emergency department immediately.  Discharge Diagnoses: Principal Problem:   Lumbar radiculopathy Active Problems:   Bacteremia   Bladder cancer (HCC)   Complicated UTI (urinary tract infection)   Paroxysmal A-fib (HCC)   Hyponatremia   CKD (chronic kidney disease), stage III (HCC)  Resolved Problems:   * No resolved hospital problems. [facility] Course:  Patient is a 73 year old gentleman with history of paroxysmal A-fib on Eliquis , hypertension, hyperlipidemia, type 2 diabetes, metastatic bladder cancer status post cystoprostatectomy, total neurectomy and right nephroureterectomy and solitary left kidney status post ureterostomy currently on immunotherapy for bladder cancer recently admitted for AKI from 03/01/2024 to 03/02/2024 with mild to moderate hydronephrosis with stent retracted to the mid ureter on imaging whereby patient underwent stent exchange per urology with improvement with renal function. Patient presenting to the ED with complaint of severe low back pain with radiation to the right lower extremity causing difficulty ambulating and standing.  Patient also noted to have a complicated UTI placed on IV antibiotics and pancultured.  Blood cultures positive for Enterococcus faecalis.  ID consulted.  IR also consulted for evaluation for epidural steroid injection however recommended outpatient referral.  ID following, patient for TEE 04/02/2024.  Assessment and Plan:  Severe lower back pain secondary to lumbar radiculopathy - Neurosurgery recommending IR  initially for epidural steroid injection.  This is on hold secondary to infection below.  Received IV Decadron , Lyrica , muscle relaxer with marked improvement in pain.  Will transition patient over to Medrol  Dosepak upon discharge.  Will also give prescription for Robaxin , Lyrica .  Patient will need to follow-up in outpatient for lumbar injection.  Enterococcus faecalis bacteremia secondary to complicated urinary tract infection  - UCx showing Enterococcus with sensitivity to ampicillin .  Was placed on ceftriaxone  plus ampicillin .  Seen by infectious disease.  TEE negative.  Port removed 6/9.  Patient to transition to p.o. amoxicillin  upon discharge x 2 weeks, last day 6/20.  Patient will need follow-up with infectious disease in the outpatient setting.  Furl sent.  Metastatic bladder cancer -Status post cystoprostatectomy, total ureterectomy and right nephro ureterectomy and solitary left kidney status post ureterostomy.  Oncology was following and in agreement and okay with Port-A-Cath removal as recommended per ID due to bacteremia.  Patient to follow-up with oncology in the outpatient setting.   Consultants: Cardiology, oncology, infectious disease Procedures performed: TEE Disposition: Home Diet recommendation:  Discharge Diet Orders (From admission, onward)     Start     Ordered   04/03/24 0000  Diet - low sodium heart healthy        04/03/24 1043           Cardiac diet  DISCHARGE MEDICATION: Allergies as of 04/03/2024       Reactions   Lisinopril  Swelling, Other (See Comments)   Tongue and throat became swollen        Medication List     STOP taking these medications    amLODipine  5 MG tablet Commonly known as: NORVASC    ciprofloxacin  500 MG tablet Commonly  known as: Cipro        TAKE these medications    amoxicillin  500 MG capsule Commonly known as: AMOXIL  Take 2 capsules (1,000 mg total) by mouth every 8 (eight) hours for 14 days.   apixaban  5 MG Tabs  tablet Commonly known as: Eliquis  Take 1 tablet (5 mg total) by mouth 2 (two) times daily.   atorvastatin  40 MG tablet Commonly known as: LIPITOR Take 1 tablet (40 mg total) by mouth at bedtime.   B-complex with vitamin C tablet Take 1 tablet by mouth 3 (three) times a week.   EPINEPHrine  0.3 mg/0.3 mL Soaj injection Commonly known as: EPI-PEN Inject 0.3 mg into the muscle as needed for anaphylaxis.   glucose blood test strip 1 each by Other route daily. Use daily for glucose control - Dx 250.00   lidocaine  5 % Commonly known as: LIDODERM  Place 1 patch onto the skin daily. Remove & Discard patch within 12 hours or as directed by MD   methocarbamol  500 MG tablet Commonly known as: ROBAXIN  Take 1 tablet (500 mg total) by mouth every 8 (eight) hours as needed for muscle spasms.   methylPREDNISolone  4 MG Tbpk tablet Commonly known as: MEDROL  DOSEPAK Take as directed   metoprolol  succinate 25 MG 24 hr tablet Commonly known as: TOPROL -XL Take 1 tablet (25 mg total) by mouth daily.   oxyCODONE  5 MG immediate release tablet Commonly known as: Oxy IR/ROXICODONE  Take 1 tablet (5 mg total) by mouth every 6 (six) hours as needed for moderate pain (pain score 4-6).   Ozempic  (1 MG/DOSE) 4 MG/3ML Sopn Generic drug: Semaglutide  (1 MG/DOSE) INJECT 1 MG UNDER THE SKIN ONCE A WEEK AS DIRECTED What changed: See the new instructions.   pregabalin  75 MG capsule Commonly known as: LYRICA  Take 1 capsule (75 mg total) by mouth daily. Start taking on: April 04, 2024   propranolol  10 MG tablet Commonly known as: INDERAL  TAKE 1 TABLET BY MOUTH 4 TIMES DAILY AS NEEDED FOR PALPITATIONS, IRREGULAR OR FAST HEART RATE   VITAMIN D-3 PO Take 1 tablet by mouth 3 (three) times a week.         Discharge Exam: Filed Weights   03/27/24 1327 03/27/24 1330  Weight: 90.7 kg 90.7 kg    GENERAL:  Alert, pleasant, no acute distress  HEENT:  EOMI CARDIOVASCULAR:  RRR, no murmurs  appreciated RESPIRATORY:  Clear to auscultation, no wheezing, rales, or rhonchi GASTROINTESTINAL:  Soft, nontender, nondistended EXTREMITIES:  No LE edema bilaterally NEURO:  No new focal deficits appreciated SKIN:  No rashes noted PSYCH:  Appropriate mood and affect     Condition at discharge: improving  The results of significant diagnostics from this hospitalization (including imaging, microbiology, ancillary and laboratory) are listed below for reference.   Imaging Studies: ECHO TEE Result Date: 04/02/2024    TRANSESOPHOGEAL ECHO REPORT   Patient Name:   LARAMIE MEISSNER II Date of Exam: 04/02/2024 Medical Rec #:  [id number]             Height:       75.0 in Accession #:    [id number]            Weight:       200.0 lb Date of Birth:  Sep 28, 1951            BSA:          2.194 m Patient Age:    70 years  BP:           145/79 mmHg Patient Gender: M                     HR:           65 bpm. Exam Location:  Inpatient Procedure: 3D Echo, Transesophageal Echo, Cardiac Doppler and Color Doppler            (Both Spectral and Color Flow Doppler were utilized during            procedure). Indications:     Endocarditis  History:         Patient has prior history of Echocardiogram examinations, most                  recent 03/29/2024. Risk Factors:Dyslipidemia, Current Smoker,                  Hypertension and Diabetes.  Sonographer:     Kip Peon RDCS Referring Phys:  [id number] ZANE ADAMS Diagnosing Phys: Janelle Mediate MD PROCEDURE: After discussion of the risks and benefits of a TEE, an informed consent was obtained. The transesophogeal probe was passed without difficulty through the esophogus of the patient. Imaged were obtained with the patient in a left lateral decubitus position. Sedation performed by different physician. The patient was monitored while under deep sedation. Anesthestetic sedation was provided intravenously by Anesthesiology: 300mg  of Propofol , 20mg  of Lidocaine . The patient  developed no complications during the procedure.  IMPRESSIONS  1. No evidence of SBE or vegetations.  2. Left ventricular ejection fraction, by estimation, is 60 to 65%. The left ventricle has normal function. There is mild left ventricular hypertrophy.  3. Right ventricular systolic function is normal. The right ventricular size is normal.  4. Left atrial size was mildly dilated. No left atrial/left atrial appendage thrombus was detected.  5. The mitral valve is abnormal. Mild to moderate mitral valve regurgitation. No evidence of mitral stenosis. Moderate mitral annular calcification.  6. The aortic valve is tricuspid. There is moderate calcification of the aortic valve. There is moderate thickening of the aortic valve. Aortic valve regurgitation is mild. Aortic valve sclerosis/calcification is present, without any evidence of aortic stenosis.  7. 3D performed of the mitral valve and 3D performed of the aortic valve and demonstrates AV and MV visualized in 3D. FINDINGS  Left Ventricle: Left ventricular ejection fraction, by estimation, is 60 to 65%. The left ventricle has normal function. The left ventricular internal cavity size was normal in size. There is mild left ventricular hypertrophy. Right Ventricle: The right ventricular size is normal. No increase in right ventricular wall thickness. Right ventricular systolic function is normal. Left Atrium: Left atrial size was mildly dilated. No left atrial/left atrial appendage thrombus was detected. Right Atrium: Right atrial size was normal in size. Pericardium: There is no evidence of pericardial effusion. Mitral Valve: The mitral valve is abnormal. There is mild thickening of the mitral valve leaflet(s). There is mild calcification of the mitral valve leaflet(s). Moderate mitral annular calcification. Mild to moderate mitral valve regurgitation. No evidence of mitral valve stenosis. There is no evidence of mitral valve vegetation. Tricuspid Valve: The tricuspid  valve is normal in structure. Tricuspid valve regurgitation is mild. Aortic Valve: The aortic valve is tricuspid. There is moderate calcification of the aortic valve. There is moderate thickening of the aortic valve. Aortic valve regurgitation is mild. Aortic valve sclerosis/calcification is present, without any evidence of aortic stenosis. Aortic  valve mean gradient measures 6.0 mmHg. Aortic valve peak gradient measures 11.0 mmHg. Aortic valve area, by VTI measures 2.22 cm. Pulmonic Valve: The pulmonic valve was normal in structure. Pulmonic valve regurgitation is trivial. Aorta: The aortic root is normal in size and structure. IAS/Shunts: The interatrial septum appears to be lipomatous. No atrial level shunt detected by color flow Doppler. Additional Comments: No evidence of SBE or vegetations. 3D was performed not requiring image post processing on an independent workstation and was abnormal. Spectral Doppler performed. LEFT VENTRICLE PLAX 2D LVOT diam:     2.43 cm LV SV:         78 LV SV Index:   36 LVOT Area:     4.65 cm  AORTIC VALVE AV Area (Vmax):    2.58 cm AV Area (Vmean):   2.15 cm AV Area (VTI):     2.22 cm AV Vmax:           166.00 cm/s AV Vmean:          116.000 cm/s AV VTI:            0.353 m AV Peak Grad:      11.0 mmHg AV Mean Grad:      6.0 mmHg LVOT Vmax:         92.20 cm/s LVOT Vmean:        53.600 cm/s LVOT VTI:          0.168 m LVOT/AV VTI ratio: 0.48  AORTA Ao Root diam: 4.40 cm Ao Asc diam:  3.80 cm  SHUNTS Systemic VTI:  0.17 m Systemic Diam: 2.43 cm Janelle Mediate MD Electronically signed by Janelle Mediate MD Signature Date/Time: 04/02/2024/11:33:25 AM    Final    EP STUDY Result Date: 04/02/2024 See surgical note for result.  ECHOCARDIOGRAM COMPLETE Result Date: 03/29/2024    ECHOCARDIOGRAM REPORT   Patient Name:   BRADSHAW MINIHAN II Date of Exam: 03/29/2024 Medical Rec #:  [id number]             Height:       75.0 in Accession #:    [id number]            Weight:       200.0 lb Date  of Birth:  1950-11-29            BSA:          2.194 m Patient Age:    72 years              BP:           118/58 mmHg Patient Gender: M                     HR:           75 bpm. Exam Location:  Inpatient Procedure: 2D Echo, Cardiac Doppler and Color Doppler (Both Spectral and Color            Flow Doppler were utilized during procedure). Indications:    Endocarditis I38  History:        Patient has prior history of Echocardiogram examinations, most                 recent 11/03/2023.  Sonographer:    Hersey Lorenzo RDCS Referring Phys: [id number] [facility] IMPRESSIONS  1. Overall poor quality study for vegetation assessment.  2. Left ventricular ejection fraction, by estimation, is 65 to 70%. The left ventricle has normal function. The left  ventricle has no regional wall motion abnormalities. There is mild left ventricular hypertrophy. Left ventricular diastolic parameters are indeterminate.  3. Right ventricular systolic function is normal. The right ventricular size is normal.  4. Left atrial size was mildly dilated.  5. 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 The mitral valve is abnormal. Trivial mitral valve regurgitation. No evidence of mitral stenosis. Severe mitral annular calcification.  6. The aortic valve was not well visualized. There is moderate calcification of the aortic valve. There is moderate thickening of the aortic valve. Aortic valve regurgitation is trivial. Aortic valve sclerosis is present, with no evidence of aortic valve stenosis.  7. Aortic dilatation noted. There is mild dilatation of the aortic root, measuring 39 mm.  8. The inferior vena cava is dilated in size with >50% respiratory variability, suggesting right atrial pressure of 8 mmHg. FINDINGS  Left Ventricle: Left ventricular ejection fraction, by estimation, is 65 to 70%. The left ventricle has normal function. The left ventricle has no regional wall motion abnormalities. Strain was performed and  the global longitudinal strain is indeterminate. The left ventricular internal cavity size was normal in size. There is mild left ventricular hypertrophy. Left ventricular diastolic parameters are indeterminate. Right Ventricle: The right ventricular size is normal. No increase in right ventricular wall thickness. Right ventricular systolic function is normal. Left Atrium: Left atrial size was mildly dilated. Right Atrium: Right atrial size was normal in size. Pericardium: There is no evidence of pericardial effusion. 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 The mitral valve is abnormal. There is mild thickening of the mitral valve leaflet(s). There is mild calcification of the mitral valve leaflet(s). Severe mitral annular calcification. Trivial mitral valve regurgitation. No evidence of mitral valve stenosis. Tricuspid Valve: The tricuspid valve is normal in structure. Tricuspid valve regurgitation is trivial. No evidence of tricuspid stenosis. Aortic Valve: The aortic valve was not well visualized. There is moderate calcification of the aortic valve. There is moderate thickening of the aortic valve. Aortic valve regurgitation is trivial. Aortic valve sclerosis is present, with no evidence of aortic valve stenosis. Pulmonic Valve: The pulmonic valve was not well visualized. Pulmonic valve regurgitation is not visualized. No evidence of pulmonic stenosis. Aorta: Aortic dilatation noted. There is mild dilatation of the aortic root, measuring 39 mm. Venous: The inferior vena cava is dilated in size with greater than 50% respiratory variability, suggesting right atrial pressure of 8 mmHg. IAS/Shunts: No atrial level shunt detected by color flow Doppler. Additional Comments: Overall poor quality study for vegetation assessment. 3D was performed not requiring image post processing on an independent workstation and was indeterminate.  LEFT VENTRICLE PLAX 2D LVIDd:          5.60 cm   Diastology LVIDs:         3.90 cm   LV e' medial:    5.44 cm/s LV PW:         1.20 cm   LV E/e' medial:  27.6 LV IVS:        1.20 cm   LV e' lateral:   8.27 cm/s LVOT diam:     2.30 cm   LV E/e' lateral: 18.1 LV SV:         96 LV SV Index:   44 LVOT Area:     4.15 cm  RIGHT VENTRICLE             IVC RV S prime:  15.30 cm/s  IVC diam: 2.60 cm TAPSE (M-mode): 2.7 cm LEFT ATRIUM            Index LA diam:      4.10 cm  1.87 cm/m LA Vol (A2C): 78.1 ml  35.59 ml/m LA Vol (A4C): 104.0 ml 47.39 ml/m  AORTIC VALVE LVOT Vmax:   117.00 cm/s LVOT Vmean:  75.000 cm/s LVOT VTI:    0.231 m  AORTA Ao Root diam: 3.90 cm Ao Asc diam:  3.50 cm MITRAL VALVE MV Area (PHT): 3.08 cm     SHUNTS MV Decel Time: 246 msec     Systemic VTI:  0.23 m MV E velocity: 150.00 cm/s  Systemic Diam: 2.30 cm MV A velocity: 107.00 cm/s MV E/A ratio:  1.40 Janelle Mediate MD Electronically signed by Janelle Mediate MD Signature Date/Time: 03/29/2024/4:30:20 PM    Final    IR REMOVAL TUN ACCESS W/ PORT W/O FL MOD SED Result Date: 03/29/2024 INDICATION: 73 year old with bacteremia.  Request for Port-A-Cath removal. EXAM: REMOVAL OF RIGHT CHEST PORT-A-CATH MEDICATIONS: 1% lidocaine  for local anesthetic ANESTHESIA/SEDATION: None FLUOROSCOPY: None COMPLICATIONS: None immediate. PROCEDURE: Informed written consent was obtained from the patient after a thorough discussion of the procedural risks, benefits and alternatives. All questions were addressed. Maximal Sterile Barrier Technique was utilized including caps, mask, sterile gowns, sterile gloves, sterile drape, hand hygiene and skin antiseptic. A timeout was performed prior to the initiation of the procedure. The right chest was prepped and draped in a sterile fashion. 1% lidocaine  was utilized for local anesthesia. An incision was made over the previously healed surgical incision. Utilizing blunt dissection, the port catheter and reservoir were removed from the underlying subcutaneous tissue in  their entirety. The pocket was irrigated with a copious amount of sterile normal saline. The subcutaneous tissue was closed with 3-0 Vicryl interrupted subcutaneous stitches. A 4-0 Vicryl running subcuticular stitch was utilized to approximate the skin. Dermabond was applied. FINDINGS: Port skin site looked normal and healthy. Port pocket was examined after the port was removed. Port pocket looked healthy. Port pocket was closed without complication. IMPRESSION: Successful removal of right chest Port-A-Cath. Electronically Signed   By: Elene Griffes M.D.   On: 03/29/2024 16:16   MR Lumbar Spine W Wo Contrast Result Date: 03/27/2024 CLINICAL DATA:  Low back pain, cancer suspected Low back pain, cauda equina syndrome suspected. EXAM: MRI LUMBAR SPINE WITHOUT AND WITH CONTRAST TECHNIQUE: Multiplanar and multiecho pulse sequences of the lumbar spine were obtained without and with intravenous contrast. CONTRAST:  9mL GADAVIST  GADOBUTROL  1 MMOL/ML IV SOLN COMPARISON:  None Available. FINDINGS: Segmentation:  Standard. Alignment: No substantial sagittal subluxation. Vertebrae: Right eccentric degenerative/discogenic endplate signal change at L4-L5. No specific evidence of acute fracture or discitis/osteomyelitis. No suspicious bone lesion. Conus medullaris and cauda equina: Conus extends to the L1-L2 level. Conus and cauda equina appear normal. Paraspinal and other soft tissues: Mild left hydronephrosis. Disc levels: T12-L1: No significant disc protrusion, foraminal stenosis, or canal stenosis. L1-L2: Left eccentric disc protrusion results in mild left foraminal stenosis. Patent canal and right foramen. L2-L3: Mild disc bulging. Mild facet arthropathy. No significant canal or foraminal stenosis L3-L4: Broad disc bulge with superimposed right subarticular disc protrusion which extends inferiorly. Bilateral facet arthropathy. Resulting moderate to severe right subarticular recess stenosis with potential impingement of  descending right L4 nerve root. Mild to moderate canal stenosis. Mild right foraminal stenosis. L4-L5: Broad disc bulge with right subarticular disc protrusion. Bilateral facet arthropathy. Resulting moderate right subarticular  recess stenosis with potential impingement of the descending right L5 nerve roots. Mild canal stenosis. Moderate right and mild-to-moderate left foraminal stenosis. L5-S1: Right eccentric disc bulge and bilateral facet arthropathy. Superiorly directed right subarticular disc protrusion. Patent canal. Moderate left foraminal stenosis. IMPRESSION: 1. At L3-L4, right subarticular disc protrusion results in moderate to severe right subarticular recess stenosis with potential impingement of descending right L4 nerve root. Mild to moderate canal stenosis and mild right foraminal stenosis. 2. At L4-L5, right subarticular disc protrusion results in moderate right subarticular recess stenosis with potential impingement of descending right L5 nerve roots. Mild canal stenosis. Moderate right and mild-to-moderate left foraminal stenosis. 3. At L5-S1, moderate left foraminal stenosis. 4. Mild left hydronephrosis, better characterized on recent CT Electronically Signed   By: Stevenson Elbe M.D.   On: 03/27/2024 21:38   CT Renal Stone Study Result Date: 03/27/2024 CLINICAL DATA:  Abdominal and flank pain. EXAM: CT ABDOMEN AND PELVIS WITHOUT CONTRAST TECHNIQUE: Multidetector CT imaging of the abdomen and pelvis was performed following the standard protocol without IV contrast. RADIATION DOSE REDUCTION: This exam was performed according to the departmental dose-optimization program which includes automated exposure control, adjustment of the mA and/or kV according to patient size and/or use of iterative reconstruction technique. COMPARISON:  CT abdomen and pelvis 03/01/2024. FINDINGS: Lower chest: No acute abnormality. Hepatobiliary: No focal liver abnormality is seen. No gallstones, gallbladder wall  thickening, or biliary dilatation. Pancreas: Unremarkable. No pancreatic ductal dilatation or surrounding inflammatory changes. Spleen: Normal in size without focal abnormality. Adrenals/Urinary Tract: Patient is status post right nephrectomy and cystectomy. Left-sided urostomy present. A left ureteral stent is present. The proximal portion of the stent is coiled in the proximal left ureter. There is mild to moderate left-sided hydronephrosis with contrast seen in the left renal collecting system. There is mild left perinephric fat stranding. No contrast is seen distal to this point. The adrenal glands are within normal limits. Stomach/Bowel: Stomach is within normal limits. Appendix appears normal. No evidence of bowel wall thickening, distention, or inflammatory changes. Colonic diverticulosis is present. Vascular/Lymphatic: Aortic atherosclerosis. No enlarged abdominal or pelvic lymph nodes. Reproductive: Prostate gland is surgically absent. Other: There is a small amount of free fluid in the pelvis similar to the prior study. There is no focal abdominal wall hernia. Musculoskeletal: Degenerative changes affect the spine. There severe degenerative changes of the right hip. IMPRESSION: 1. Left ureteral stent is coiled in the proximal left ureter. There is mild to moderate left-sided hydronephrosis with contrast seen in the left renal collecting system only. Obstructive process not excluded secondary to stent positioning. Recommend clinical correlation and follow-up. 2. Left-sided urostomy. 3. Status post right nephrectomy and cystectomy. 4. Stable small amount of free fluid in the pelvis. 5. Colonic diverticulosis. 6. Aortic atherosclerosis. Aortic Atherosclerosis (ICD10-I70.0). Electronically Signed   By: Tyron Gallon M.D.   On: 03/27/2024 19:09    Microbiology: Results for orders placed or performed during the hospital encounter of 03/27/24  Urine Culture (for pregnant, neutropenic or urologic patients or  patients with an indwelling urinary catheter)     Status: Abnormal   Collection Time: 03/27/24  2:06 PM   Specimen: Urine, Clean Catch  Result Value Ref Range Status   Specimen Description   Final    URINE, CLEAN CATCH Performed at [facility], [address], [place], [place] [address]    Special Requests   Final    NONE Performed at [facility], [address] [place], [place] [address]    Culture (A)  Final    >=100,000 COLONIES/mL ENTEROCOCCUS FAECALIS WITHIN MIXED ORGANISMS Performed at [facility], [address], [place], [place] [id number]    Report Status 03/29/2024 FINAL  Final   Organism ID, Bacteria ENTEROCOCCUS FAECALIS (A)  Final      Susceptibility   Enterococcus faecalis - MIC*    AMPICILLIN  <=2 SENSITIVE Sensitive     NITROFURANTOIN  <=16 SENSITIVE Sensitive     VANCOMYCIN 2 SENSITIVE Sensitive     * >=100,000 COLONIES/mL ENTEROCOCCUS FAECALIS  Blood culture (routine x 2)     Status: Abnormal   Collection Time: 03/27/24  2:44 PM   Specimen: BLOOD LEFT ARM  Result Value Ref Range Status   Specimen Description   Final    BLOOD LEFT ARM Performed at [facility], [address], [place], [place] [address]    Special Requests   Final    BOTTLES DRAWN AEROBIC AND ANAEROBIC Blood Culture adequate volume Performed at [facility], [address] [place], [place] [address]    Culture  Setup Time   Final    GRAM POSITIVE COCCI IN CHAINS AEROBIC BOTTLE ONLY CRITICAL RESULT CALLED TO, READ BACK BY AND VERIFIED WITH: PHARMD Annmarie Basket on [id number] @0929  by SM Performed at [facility], [address], [place], [place] [address]    Culture ENTEROCOCCUS FAECALIS (A)  Final   Report Status 03/30/2024 FINAL  Final   Organism ID, Bacteria ENTEROCOCCUS FAECALIS  Final      Susceptibility   Enterococcus faecalis - MIC*    AMPICILLIN  <=2 SENSITIVE Sensitive     VANCOMYCIN 2 SENSITIVE Sensitive      GENTAMICIN  SYNERGY SENSITIVE Sensitive     * ENTEROCOCCUS FAECALIS  Blood culture (routine x 2)     Status: Abnormal   Collection Time: 03/27/24  2:44 PM   Specimen: BLOOD RIGHT ARM  Result Value Ref Range Status   Specimen Description   Final    BLOOD RIGHT ARM Performed at [facility], [address] [place], [place] [address]    Special Requests   Final    BOTTLES DRAWN AEROBIC AND ANAEROBIC Blood Culture results may not be optimal due to an inadequate volume of blood received in culture bottles Performed at [facility], [address] [place], [place] [address]    Culture  Setup Time   Final    GRAM POSITIVE COCCI IN BOTH AEROBIC AND ANAEROBIC BOTTLES CRITICAL VALUE NOTED.  VALUE IS CONSISTENT WITH PREVIOUSLY REPORTED AND CALLED VALUE.    Culture (A)  Final    ENTEROCOCCUS FAECALIS SUSCEPTIBILITIES PERFORMED ON PREVIOUS CULTURE WITHIN THE LAST 5 DAYS. Performed at [facility], [address], [place], [place] [address]    Report Status 03/30/2024 FINAL  Final  Blood Culture ID Panel (Reflexed)     Status: Abnormal   Collection Time: 03/27/24  2:44 PM  Result Value Ref Range Status   Enterococcus faecalis DETECTED (A) NOT DETECTED Final    Comment: CRITICAL RESULT CALLED TO, READ BACK BY AND VERIFIED WITH: PHARMD Justin L on [id number] @0929  by SM    Enterococcus Faecium NOT DETECTED NOT DETECTED Final   Listeria monocytogenes NOT DETECTED NOT DETECTED Final   Staphylococcus species NOT DETECTED NOT DETECTED Final   Staphylococcus aureus (BCID) NOT DETECTED NOT DETECTED Final   Staphylococcus epidermidis NOT DETECTED NOT DETECTED Final  Staphylococcus lugdunensis NOT DETECTED NOT DETECTED Final   Streptococcus species NOT DETECTED NOT DETECTED Final   Streptococcus agalactiae NOT DETECTED NOT DETECTED Final   Streptococcus pneumoniae NOT DETECTED NOT DETECTED Final   Streptococcus pyogenes NOT DETECTED NOT DETECTED Final    A.calcoaceticus-baumannii NOT DETECTED NOT DETECTED Final   Bacteroides fragilis NOT DETECTED NOT DETECTED Final   Enterobacterales NOT DETECTED NOT DETECTED Final   Enterobacter cloacae complex NOT DETECTED NOT DETECTED Final   Escherichia coli NOT DETECTED NOT DETECTED Final   Klebsiella aerogenes NOT DETECTED NOT DETECTED Final   Klebsiella oxytoca NOT DETECTED NOT DETECTED Final   Klebsiella pneumoniae NOT DETECTED NOT DETECTED Final   Proteus species NOT DETECTED NOT DETECTED Final   Salmonella species NOT DETECTED NOT DETECTED Final   Serratia marcescens NOT DETECTED NOT DETECTED Final   Haemophilus influenzae NOT DETECTED NOT DETECTED Final   Neisseria meningitidis NOT DETECTED NOT DETECTED Final   Pseudomonas aeruginosa NOT DETECTED NOT DETECTED Final   Stenotrophomonas maltophilia NOT DETECTED NOT DETECTED Final   Candida albicans NOT DETECTED NOT DETECTED Final   Candida auris NOT DETECTED NOT DETECTED Final   Candida glabrata NOT DETECTED NOT DETECTED Final   Candida krusei NOT DETECTED NOT DETECTED Final   Candida parapsilosis NOT DETECTED NOT DETECTED Final   Candida tropicalis NOT DETECTED NOT DETECTED Final   Cryptococcus neoformans/gattii NOT DETECTED NOT DETECTED Final   Vancomycin resistance NOT DETECTED NOT DETECTED Final    Comment: Performed at [facility], [address], [place], [place] [address]  Culture, blood (Routine X 2) w Reflex to ID Panel     Status: None   Collection Time: 03/28/24  4:15 PM   Specimen: BLOOD  Result Value Ref Range Status   Specimen Description   Final    BLOOD SITE NOT SPECIFIED Performed at [facility], [address], [place], [place] [address]    Special Requests   Final    BOTTLES DRAWN AEROBIC AND ANAEROBIC Blood Culture adequate volume Performed at [facility], [address] [place], [place] [address]    Culture   Final    NO GROWTH 5 DAYS Performed at [facility], [address], [place], [place] [address]    Report Status 04/02/2024 FINAL  Final  Culture, blood (Routine X 2) w Reflex to ID Panel     Status: None   Collection Time: 03/28/24  4:24 PM   Specimen: BLOOD  Result Value Ref Range Status   Specimen Description   Final    BLOOD SITE NOT SPECIFIED Performed at [facility], [address] [place], [place] [address]    Special Requests   Final    BOTTLES DRAWN AEROBIC AND ANAEROBIC Blood Culture adequate volume Performed at [facility], [address], [place], [place] [address]    Culture   Final    NO GROWTH 5 DAYS Performed at [facility], [address] [place], [place] [address]    Report Status 04/02/2024 FINAL  Final  Culture, blood (Routine X 2) w Reflex to ID Panel     Status: None (Preliminary result)   Collection Time: 04/01/24 12:06 PM   Specimen: BLOOD LEFT ARM  Result Value Ref Range Status   Specimen Description   Final    BLOOD LEFT ARM Performed at [facility], [address] [place], [place] [address]    Special Requests  Final    BOTTLES DRAWN AEROBIC AND ANAEROBIC Blood Culture adequate volume Performed at [facility], [address] [place], [place] [address]    Culture   Final    NO GROWTH 2 DAYS Performed at [facility], [address] [place], [place] [address]    Report Status PENDING  Incomplete  Culture, blood (Routine X 2) w Reflex to ID Panel     Status: None (Preliminary result)   Collection Time: 04/01/24 12:06 PM   Specimen: BLOOD RIGHT ARM  Result Value Ref Range Status   Specimen Description   Final    BLOOD RIGHT ARM Performed at [facility], [address], [place], [place] [address]    Special Requests   Final    BOTTLES DRAWN AEROBIC AND ANAEROBIC Blood Culture results may not be optimal due to an inadequate volume of blood received in culture bottles Performed at [facility], [address],  [place], [place] [address]    Culture   Final    NO GROWTH 2 DAYS Performed at [facility], [address], [place], [place] [address]    Report Status PENDING  Incomplete    Labs: CBC: Recent Labs  Lab 03/27/24 1444 03/28/24 0320 03/29/24 0448 03/30/24 1884 03/31/24 1660 04/01/24 0525 04/02/24 0531 04/03/24 0554  WBC 13.4*   < > 11.5* 10.0 8.9 9.4 10.1 10.6*  NEUTROABS 10.4*  --  8.9* 8.3*  --   --   --   --   HGB 10.6*   < > 9.4* 8.8* 9.1* 9.0* 9.6* 10.3*  HCT 31.2*   < > 28.9* 27.1* 28.3* 27.9* 28.9* 31.6*  MCV 83.6   < > 85.3 84.4 86.3 86.9 84.0 85.9  PLT 171   < > 158 151 153 151 175 195   < > = values in this interval not displayed.   Basic Metabolic Panel: Recent Labs  Lab 03/29/24 0448 03/30/24 0616 03/31/24 0625 04/01/24 0525 04/02/24 0531 04/03/24 0554  NA 134* 132* 134* 132* 131* 131*  K 3.8 4.6 4.9 4.7 4.9 4.9  CL 105 103 105 102 99 98  CO2 20* 23 22 22 24 26   GLUCOSE 146* 237* 213* 254* 183* 232*  BUN 23 27* 29* 32* 24* 27*  CREATININE 1.16 1.30* 1.21 1.30* 1.21 1.23  CALCIUM  8.7* 8.2* 8.1* 8.0* 8.1* 8.2*  MG 1.8 2.0  --   --   --   --    Liver Function Tests: Recent Labs  Lab 03/27/24 1444  AST 19  ALT 13  ALKPHOS 81  BILITOT 0.9  PROT 7.8  ALBUMIN 3.5   CBG: Recent Labs  Lab 04/02/24 0722 04/02/24 1353 04/02/24 1640 04/02/24 2051 04/03/24 0738  GLUCAP 175* 161* 242* 233* 252*    Discharge time spent: 31 minutes.  Length of inpatient stay: 6 days  Signed: [name], DO [facility] 04/03/2024

## 2024-04-03 NOTE — TOC Transition Note (Addendum)
 Transition of Care [facility]) - Discharge Note   Patient Details  Name: Robert [name] MRN: [id number] Date of Birth: [birth year]  Transition of Care [facility]) CM/SW Contact:  Jonni Nettle, LCSW Phone Number: 04/03/2024, 11:02 AM   Clinical Narrative:    CSW met with pt at bedside to discuss DME needs. Pt inquired about obtaining a medical recliner chair and raised toilet seat. CSW advised that Medicare will only cover one piece of DME every 5 years. CSW also advised pt that raised toilet seat and medical recliner chair is not typically covered by insurance. CSW advised pt that he would need to prviate pay for that type of DME. CSW advised pt to go to medical supply store or search online to order DME, if he chooses. Pt recommended for OPPT services upon discharge. CSW sent referral to [facility] for rehabilitation services. No further TOC needs at this time.   Final next level of care: Home/Self Care Barriers to Discharge: Barriers Resolved   Patient Goals and CMS Choice Patient states their goals for this hospitalization and ongoing recovery are:: To return home  Discharge Placement Home  Discharge Plan and Services Additional resources added to the After Visit Summary for OPPT services In-house Referral: Clinical Social Work Discharge Planning Services: NA Post Acute Care Choice: NA          DME Arranged: N/A DME Agency: NA       HH Arranged: NA HH Agency: NA        Social Drivers of Health (SDOH) Interventions SDOH Screenings   Food Insecurity: No Food Insecurity (03/28/2024)  Housing: Low Risk  (03/28/2024)  Transportation Needs: No Transportation Needs (03/28/2024)  Utilities: Not At Risk (03/28/2024)  Alcohol  Screen: Low Risk  (10/08/2023)  Depression (PHQ2-9): Low Risk  (03/26/2024)  Financial Resource Strain: Low Risk  (10/08/2023)  Physical Activity: Insufficiently Active (03/26/2024)  Social Connections: Moderately Isolated (03/28/2024)  Stress: No  Stress Concern Present (10/08/2023)  Tobacco Use: High Risk (04/02/2024)  Health Literacy: Adequate Health Literacy (03/26/2024)     Readmission Risk Interventions    04/01/2024    2:58 PM 02/07/2024    2:07 PM 12/10/2023    4:50 PM  Readmission Risk Prevention Plan  Transportation Screening Complete Complete Complete  PCP or Specialist Appt within 5-7 Days   Complete  PCP or Specialist Appt within 3-5 Days Complete Complete   Home Care Screening   Complete  Medication Review (RN CM)   Complete  HRI or Home Care Consult Complete Complete   Social Work Consult for Recovery Care Planning/Counseling Complete Complete   Palliative Care Screening Not Applicable Not Applicable   Medication Review [employer]) Complete Referral to Pharmacy     [place], MSW, LCSW 04/03/2024 11:03 AM

## 2024-04-03 NOTE — Progress Notes (Signed)
 Occupational Therapy Treatment Patient Details Name: Robert [name] MRN: [id number] DOB: [birth year] Today's Date: 04/03/2024   History of present illness 73 yo presents with severe low back pain with radiation to the R LE. IR also consulted for evaluation for epidural steroid injection however recommended outpatient referral on 6/5. Recently admitted for AKI from 03/01/2024 to 03/02/2024 with mild to moderate hydronephrosis with stent retracted to the mid ureter on imaging whereby patient underwent stent exchange per urology with improvement with renal function. PMH: paroxysmal A-fib on Eliquis , HTN, hyperlipidemia, type 2 diabetes, metastatic bladder cancer s/p cystoprostatectomy, total neurectomy and right nephroureterectomy and solitary left kidney s/p ureterostomy currently on immunotherapy for bladder cancer.   OT comments  Patient was able to make progress towards goals. Patient able to stand at sink for grooming with occasional UE support on walker. No LOB noted. Patient plans to d/c home with caregiver support when medically stable.Patient's discharge plan remains appropriate at this time. OT will continue to follow acutely.        If plan is discharge home, recommend the following:  A little help with walking and/or transfers;A little help with bathing/dressing/bathroom;Assistance with cooking/housework;Assist for transportation   Equipment Recommendations  Other (comment) (reacher)       Precautions / Restrictions Precautions Precautions: Fall;Back Precaution/Restrictions Comments: urostomy Restrictions Weight Bearing Restrictions Per Provider Order: No       Mobility Bed Mobility               General bed mobility comments: up in recliner and returned to the same          ADL either performed or assessed with clinical judgement   ADL Overall ADL's : Needs assistance/impaired     Grooming: Standing;Oral care;Wash/dry hands Grooming Details (indicate cue  type and reason): standing at sink over 5 mins with occasional UE support               Lower Body Dressing Details (indicate cue type and reason): declined education with AE reporting he did not need it. patient was educaed on reacher benefits patient verbalized understanding.               General ADL Comments: patient had good safety awareness and planning for home with patient reporting he is having someone move chairs around at home for safe manuvering with RW and steady recliner at home instead of his usual rocking chair. patient was praised for his good awareness and to keep up his ill try attitude in the healing process.      Cognition Arousal: Alert Behavior During Therapy: WFL for tasks assessed/performed Cognition: No apparent impairments                                                      Pertinent Vitals/ Pain       Pain Assessment Pain Assessment: Faces Faces Pain Scale: Hurts a little bit Pain Location: low back, Pain Descriptors / Indicators: Constant            Progress Toward Goals  OT Goals(current goals can now be found in the care plan section)  Progress towards OT goals: Progressing toward goals     Plan         AM-PAC OT 6 Clicks Daily Activity     Outcome Measure   Help  from another person eating meals?: None Help from another person taking care of personal grooming?: A Little Help from another person toileting, which includes using toliet, bedpan, or urinal?: A Little Help from another person bathing (including washing, rinsing, drying)?: A Little Help from another person to put on and taking off regular upper body clothing?: A Little Help from another person to put on and taking off regular lower body clothing?: A Lot 6 Click Score: 18    End of Session Equipment Utilized During Treatment: Rolling walker (2 wheels)  OT Visit Diagnosis: Unsteadiness on feet (R26.81);Other abnormalities of gait and mobility  (R26.89);Muscle weakness (generalized) (M62.81);Pain Pain - Right/Left: Right Pain - part of body: Hip   Activity Tolerance Patient tolerated treatment well   Patient Left in chair;with call bell/phone within reach   Nurse Communication Mobility status        Time: 1610-9604 OT Time Calculation (min): 19 min  Charges: OT General Charges $OT Visit: 1 Visit OT Treatments $Self Care/Home Management : 8-22 mins  Wynette Heckler, MS Acute Rehabilitation Department Office# [phone]   Jame Maze 04/03/2024, 10:51 AM

## 2024-04-03 NOTE — Telephone Encounter (Signed)
 Pharmacy Patient Advocate Encounter  Received notification from [employer] that Prior Authorization for Lidocaine  5% patches has been APPROVED from 04/03/2024 to 10/23/2024   PA #/Case ID/Reference #: [id number]  Key: [id number]

## 2024-04-03 NOTE — Plan of Care (Signed)
  Problem: Education: Goal: Knowledge of the procedure and recovery process will improve Outcome: Progressing   Problem: Bowel/Gastric: Goal: Gastrointestinal status for postoperative course will improve Outcome: Progressing   Problem: Pain Management: Goal: General experience of comfort will improve Outcome: Progressing   Problem: Skin Integrity: Goal: Demonstration of wound healing without infection will improve Outcome: Progressing   Problem: Urinary Elimination: Goal: Ability to avoid or minimize complications of infection will improve Outcome: Progressing Goal: Ability to achieve and maintain urine output will improve Outcome: Progressing Goal: Home care management will improve Outcome: Progressing   Problem: Education: Goal: Ability to describe self-care measures that may prevent or decrease complications (Diabetes Survival Skills Education) will improve Outcome: Progressing Goal: Individualized Educational Video(s) Outcome: Progressing   Problem: Coping: Goal: Ability to adjust to condition or change in health will improve Outcome: Progressing   Problem: Fluid Volume: Goal: Ability to maintain a balanced intake and output will improve Outcome: Progressing   Problem: Health Behavior/Discharge Planning: Goal: Ability to identify and utilize available resources and services will improve Outcome: Progressing Goal: Ability to manage health-related needs will improve Outcome: Progressing   Problem: Metabolic: Goal: Ability to maintain appropriate glucose levels will improve Outcome: Progressing   Problem: Nutritional: Goal: Maintenance of adequate nutrition will improve Outcome: Progressing Goal: Progress toward achieving an optimal weight will improve Outcome: Progressing   Problem: Skin Integrity: Goal: Risk for impaired skin integrity will decrease Outcome: Progressing   Problem: Tissue Perfusion: Goal: Adequacy of tissue perfusion will improve Outcome:  Progressing   Problem: Education: Goal: Knowledge of General Education information will improve Description: Including pain rating scale, medication(s)/side effects and non-pharmacologic comfort measures Outcome: Progressing   Problem: Health Behavior/Discharge Planning: Goal: Ability to manage health-related needs will improve Outcome: Progressing   Problem: Clinical Measurements: Goal: Ability to maintain clinical measurements within normal limits will improve Outcome: Progressing Goal: Will remain free from infection Outcome: Progressing Goal: Diagnostic test results will improve Outcome: Progressing Goal: Respiratory complications will improve Outcome: Progressing Goal: Cardiovascular complication will be avoided Outcome: Progressing   Problem: Activity: Goal: Risk for activity intolerance will decrease Outcome: Progressing   Problem: Nutrition: Goal: Adequate nutrition will be maintained Outcome: Progressing   Problem: Coping: Goal: Level of anxiety will decrease Outcome: Progressing   Problem: Elimination: Goal: Will not experience complications related to bowel motility Outcome: Progressing Goal: Will not experience complications related to urinary retention Outcome: Progressing   Problem: Pain Managment: Goal: General experience of comfort will improve and/or be controlled Outcome: Progressing   Problem: Safety: Goal: Ability to remain free from injury will improve Outcome: Progressing   Problem: Skin Integrity: Goal: Risk for impaired skin integrity will decrease Outcome: Progressing

## 2024-04-03 NOTE — Progress Notes (Signed)
 Physical Therapy Treatment Patient Details Name: Robert [name] MRN: [id number] DOB: [birth year] Today's Date: 04/03/2024   History of Present Illness 73 yo presents with severe low back pain with radiation to the R LE. IR also consulted for evaluation for epidural steroid injection however recommended outpatient referral on 6/5. Recently admitted for AKI from 03/01/2024 to 03/02/2024 with mild to moderate hydronephrosis with stent retracted to the mid ureter on imaging whereby patient underwent stent exchange per urology with improvement with renal function. PMH: paroxysmal A-fib on Eliquis , HTN, hyperlipidemia, type 2 diabetes, metastatic bladder cancer s/p cystoprostatectomy, total neurectomy and right nephroureterectomy and solitary left kidney s/p ureterostomy currently on immunotherapy for bladder cancer.    PT Comments  Pt AxO x 3 pleasant and willing despite some back pain.  Assisted with amb in hallway.  General Gait Details: tolerated an increased distance 45 feet amb in hallway with walker slow but steady with light lean on walker for support due to back pain. Pt plans to return home as he care for his Spouse who suffers from dementia.    If plan is discharge home, recommend the following: A little help with walking and/or transfers;A little help with bathing/dressing/bathroom;Assistance with cooking/housework;Assist for transportation;Help with stairs or ramp for entrance   Can travel by private vehicle        Equipment Recommendations  None recommended by PT    Recommendations for Other Services       Precautions / Restrictions Precautions Precautions: Fall;Back Precaution/Restrictions Comments: urostomy Restrictions Weight Bearing Restrictions Per Provider Order: No     Mobility  Bed Mobility               General bed mobility comments: OOB in recliner    Transfers Overall transfer level: Needs assistance Equipment used: Rolling walker (2  wheels) Transfers: Sit to/from Stand Sit to Stand: Supervision           General transfer comment: good safety cognition and use of hands to steady self    Ambulation/Gait Ambulation/Gait assistance: Supervision, Contact guard assist Gait Distance (Feet): 45 Feet Assistive device: Rolling walker (2 wheels) Gait Pattern/deviations: Step-through pattern, Trunk flexed, Antalgic, Decreased stance time - right Gait velocity: decreaed     General Gait Details: tolerated an increased distance 45 feet amb in hallway with walker slow but steady with light lean on walker for support due to back pain.   Stairs             Wheelchair Mobility     Tilt Bed    Modified Rankin (Stroke Patients Only)       Balance                                            Communication Communication Communication: No apparent difficulties  Cognition Arousal: Alert Behavior During Therapy: WFL for tasks assessed/performed   PT - Cognitive impairments: No apparent impairments                       PT - Cognition Comments: AxO x 3 pleasant Following commands: Intact      Cueing Cueing Techniques: Verbal cues  Exercises      General Comments        Pertinent Vitals/Pain Pain Assessment Pain Assessment: Faces Pain Location: low back, Pain Descriptors / Indicators: Constant, Aching Pain Intervention(s): Monitored during session  Home Living                          Prior Function            PT Goals (current goals can now be found in the care plan section) Progress towards PT goals: Progressing toward goals    Frequency           PT Plan      Co-evaluation              AM-PAC PT 6 Clicks Mobility   Outcome Measure  Help needed turning from your back to your side while in a flat bed without using bedrails?: None Help needed moving from lying on your back to sitting on the side of a flat bed without using  bedrails?: None Help needed moving to and from a bed to a chair (including a wheelchair)?: None Help needed standing up from a chair using your arms (e.g., wheelchair or bedside chair)?: None Help needed to walk in hospital room?: A Little Help needed climbing 3-5 steps with a railing? : A Little 6 Click Score: 22    End of Session Equipment Utilized During Treatment: Gait belt   Patient left: in chair Nurse Communication: Mobility status PT Visit Diagnosis: Other abnormalities of gait and mobility (R26.89);Pain     Time: 4098-1191 PT Time Calculation (min) (ACUTE ONLY): 15 min  Charges:    $Gait Training: 8-22 mins PT General Charges $$ ACUTE PT VISIT: 1 Visit                     Bess Broody  PTA Acute  Rehabilitation Services Office M-F          [phone]

## 2024-04-03 NOTE — Progress Notes (Signed)
 [facility]  Date of Admission:  03/27/2024   Total days of inpatient antibiotics 7  Principal Problem:   Lumbar radiculopathy Active Problems:   Paroxysmal A-fib (HCC)   Bladder cancer (HCC)   Bacteremia   Complicated UTI (urinary tract infection)   Hyponatremia   CKD (chronic kidney disease), stage III [facility])          Assessment: 73 year old male with bladder cancer status postchemotherapy, cystoprostatectomy, right nephroureterectomy,total urethrectomy now on nivolumab  every 4 weeks, admission in April for Serratia moccasins UTI/bacteremia treated with Cipro  and amoxicillin  x 2 weeks EOT 4/27, recent admission 5 9-5 10 for AKI underwent stent placement with urology presents with 1 day history of worsening back pain found to have   #E faecalis bacteremia secondary to UTI, patient has ostomy #Back pain #Bladder cancer, gets nivolumab  every 4 weeks via port - Patient denies any abdominal pain.  MR lumbar spine showed no concern for infection.  Urine and blood cultures from admission grew E faecalis(2/2 sets). - CT renal study showed coiled stent in left ureter, mild to moderate hydronephrosis.  Obstructive process not excluded. -Urine obtained from ostomy which incubating, leukocyte positive - ID engaged due to E faecalis bacteremia.  Patient denies fevers or chills. -Port was pulled on 6/6 noted port pocket looked normal and healthy. - TTE, and TEE no vegetation - Blood Cx from 6/*5 and 6/9 remain negative Recommendations: -D/C ceftriaxone  and ampicillin   -Back pain improving -Transition to Amoxicillin1gm tid x 2 weeks form Port removal(6/6) EOT 6/20 -ID f/u on 7/1 for surveillance cx -ID will sign off -Plan communicated to primary  Evaluation of this patient requires complex antimicrobial therapy evaluation and counseling + isolation needs for disease transmission risk assessment and mitigation    Microbiology:    Antibiotics: Ampicillin  6/5-present 6/4 cefepime  Ceftriaxone  6/4-present Gentamicin  6/5 Cultures: Blood 6/4 2/2 efaecalis 6/5 6/9 ng Urine 6/4 >100k e faecalis Other   SUBJECTIVE: Working with PT Interval: afebrile overnight   Review of Systems: Review of Systems  All other systems reviewed and are negative.    Scheduled Meds:  acetaminophen   1,000 mg Oral TID   amoxicillin   1,000 mg Oral Q8H   apixaban   5 mg Oral BID   atorvastatin   40 mg Oral QHS   dexamethasone  (DECADRON ) injection  4 mg Intravenous Q8H   insulin  aspart  0-15 Units Subcutaneous TID WC   insulin  aspart  0-5 Units Subcutaneous QHS   insulin  aspart  6 Units Subcutaneous TID WC   insulin  glargine-yfgn  15 Units Subcutaneous Daily   lidocaine   1 patch Transdermal Q24H   lidocaine -EPINEPHrine   20 mL Intradermal Once   metoprolol  succinate  25 mg Oral QHS   polyethylene glycol  17 g Oral Daily   pregabalin   75 mg Oral Daily   senna-docusate  1 tablet Oral BID   Continuous Infusions:   PRN Meds:.acetaminophen  **OR** acetaminophen , guaiFENesin -dextromethorphan , HYDROmorphone  (DILAUDID ) injection, methocarbamol , naLOXone  (NARCAN )  injection, oxyCODONE , phenol Allergies  Allergen Reactions   Lisinopril  Swelling and Other (See Comments)    Tongue and throat became swollen    OBJECTIVE: Vitals:   04/02/24 1145 04/02/24 1349 04/02/24 1942 04/03/24 0522  BP: (!) 161/73 (!) 153/95 125/69 113/76  Pulse: 63 65 63 60  Resp: 20 17 18 17   Temp:  97.8 F (36.6 C) 98 F (36.7 C) 97.6 F (36.4 C)  TempSrc:   Oral Oral  SpO2: (!) 89% 100%  99% 98%  Weight:      Height:       Body mass index is 25 kg/m.  Physical Exam Constitutional:      General: He is not in acute distress.    Appearance: He is normal weight. He is not toxic-appearing.  HENT:     Head: Normocephalic and atraumatic.     Right Ear: External ear normal.     Left Ear: External ear normal.     Nose: No congestion or rhinorrhea.      Mouth/Throat:     Mouth: Mucous membranes are moist.     Pharynx: Oropharynx is clear.  Eyes:     Extraocular Movements: Extraocular movements intact.     Conjunctiva/sclera: Conjunctivae normal.     Pupils: Pupils are equal, round, and reactive to light.  Cardiovascular:     Rate and Rhythm: Normal rate and regular rhythm.     Heart sounds: No murmur heard.    No friction rub. No gallop.  Pulmonary:     Effort: Pulmonary effort is normal.     Breath sounds: Normal breath sounds.  Abdominal:     General: Bowel sounds are normal.     Palpations: Abdomen is soft.     Comments:  urostomy  Musculoskeletal:        General: No swelling.     Cervical back: Normal range of motion and neck supple.  Skin:    General: Skin is warm and dry.  Neurological:     General: No focal deficit present.  Psychiatric:        Mood and Affect: Mood normal.       Lab Results Lab Results  Component Value Date   WBC 10.6 (H) 04/03/2024   HGB 10.3 (L) 04/03/2024   HCT 31.6 (L) 04/03/2024   MCV 85.9 04/03/2024   PLT 195 04/03/2024    Lab Results  Component Value Date   CREATININE 1.23 04/03/2024   BUN 27 (H) 04/03/2024   NA 131 (L) 04/03/2024   K 4.9 04/03/2024   CL 98 04/03/2024   CO2 26 04/03/2024    Lab Results  Component Value Date   ALT 13 03/27/2024   AST 19 03/27/2024   ALKPHOS 81 03/27/2024   BILITOT 0.9 03/27/2024        Orlie Bjornstad, MD [facility] 04/03/2024, 10:35 AM

## 2024-04-04 ENCOUNTER — Telehealth: Payer: Self-pay

## 2024-04-04 ENCOUNTER — Ambulatory Visit

## 2024-04-04 ENCOUNTER — Ambulatory Visit: Admitting: Nurse Practitioner

## 2024-04-04 ENCOUNTER — Other Ambulatory Visit: Payer: Self-pay | Admitting: Hematology and Oncology

## 2024-04-04 ENCOUNTER — Other Ambulatory Visit

## 2024-04-04 ENCOUNTER — Encounter: Payer: Self-pay | Admitting: Hematology

## 2024-04-04 LAB — BLOOD CULTURE ID PANEL (REFLEXED) - BCID2

## 2024-04-04 NOTE — Progress Notes (Signed)
     Patient Name: Robert [name]           DOB: 1951-01-15  MRN: [id number]      PROGRESS REPORT   Patient was discharged on 6/11 after receiving tx for Enterococcus faecalis bacteremia secondary to complicated UTI.    TEE showed no vegetation.  Port was pulled on 6/6.  Per ID consult, pt was discharged on amoxicillin  1g TID x 2 weeks.  Microbiology update: Preliminary results from [facility] collected on 6/9 ---> 1 of 4 bottles (anaerobic only) showing gram-negative rods. Discussed with Dr. Macarthur Savory, [facility] final report will be followed.    Kaivon Livesey, DNP, ACNPC- AG [facility]

## 2024-04-04 NOTE — Transitions of Care (Post Inpatient/ED Visit) (Signed)
 04/04/2024  Name: Robert [name] MRN: [id number] DOB: 08/20/1951  Today's TOC FU Call Status: Today's TOC FU Call Status:: Successful TOC FU Call Completed TOC FU Call Complete Date: 04/04/24 Patient's Name and Date of Birth confirmed.  Transition Care Management Follow-up Telephone Call Date of Discharge: 04/03/24 Discharge Facility: [facility]) Type of Discharge: Inpatient Admission Primary Inpatient Discharge Diagnosis:: Lumbar radiculopathy How have you been since you were released from the hospital?: Better Any questions or concerns?: No  Items Reviewed: Did you receive and understand the discharge instructions provided?: Yes Medications obtained,verified, and reconciled?: Yes (Medications Reviewed) Any new allergies since your discharge?: No Dietary orders reviewed?: Yes Type of Diet Ordered:: low sodium heart healthy Do you have support at home?: Yes People in Home [RPT]: spouse Name of Support/Comfort Primary Source: Patient states his wife is home and has dementia but is still pretty independent in her own self care - does not cook but has neighbors bringing food  Medications Reviewed Today: Medications Reviewed Today     Reviewed by Sharmaine Dearth, RN (Registered Nurse) on 04/04/24 at 1003  Med List Status: <None>   Medication Order Taking? Sig Documenting Provider Last Dose Status Informant  amoxicillin  (AMOXIL ) 500 MG capsule [id number] Yes Take 2 capsules (1,000 mg total) by mouth every 8 (eight) hours for 14 days. [name], DO  Active   apixaban  (ELIQUIS ) 5 MG TABS tablet [id number] Yes Take 1 tablet (5 mg total) by mouth 2 (two) times daily. Nahser, Lela Purple, MD  Active Self  atorvastatin  (LIPITOR) 40 MG tablet [id number] Yes Take 1 tablet (40 mg total) by mouth at bedtime. Audria Leather, MD  Active Self  B Complex-C (B-COMPLEX WITH VITAMIN C) tablet [id number] Yes Take 1 tablet by mouth 3 (three) times a week. [provider]  Active  Self           Med Note Mliss Anderson [id number], SUSAN A   Sun Feb 04, 2024  4:51 PM)    Cholecalciferol (VITAMIN D-3 PO) [id number] Yes Take 1 tablet by mouth 3 (three) times a week. [provider]  Active Self           Med Note Nolan Battle, [facility]   Wed Mar 27, 2024 10:04 PM)    EPINEPHrine  0.3 mg/0.3 mL IJ SOAJ injection [id number] Yes Inject 0.3 mg into the muscle as needed for anaphylaxis. [name], PA-C  Active Self  glucose blood test strip [id number] Yes 1 each by Other route daily. Use daily for glucose control - Dx 250.00 [name], [name], MD  Active Self  lidocaine  (LIDODERM ) 5 % [id number] Yes Place 1 patch onto the skin daily. Remove & Discard patch within 12 hours or as directed by MD [name], DO  Active   methocarbamol  (ROBAXIN ) 500 MG tablet [id number] Yes Take 1 tablet (500 mg total) by mouth every 8 (eight) hours as needed for muscle spasms. [name], DO  Active   methylPREDNISolone  (MEDROL  DOSEPAK) 4 MG TBPK tablet [id number] Yes Take as directed [name], DO  Active   metoprolol  succinate (TOPROL -XL) 25 MG 24 hr tablet [id number] Yes Take 1 tablet (25 mg total) by mouth daily. Nahser, Lela Purple, MD  Active Self  oxyCODONE  (OXY IR/ROXICODONE ) 5 MG immediate release tablet [id number] Yes Take 1 tablet (5 mg total) by mouth every 6 (six) hours as needed for moderate pain (pain score 4-6). [name], DO  Active  pregabalin  (LYRICA ) 75 MG capsule [id number] Yes Take 1 capsule (75 mg total) by mouth daily. [name], DO  Active   propranolol  (INDERAL ) 10 MG tablet [id number] Yes TAKE 1 TABLET BY MOUTH 4 TIMES DAILY AS NEEDED FOR PALPITATIONS, IRREGULAR OR FAST HEART RATE Nahser, Lela Purple, MD  Active Self           Med Note Guido Leeks, [name]   Fri Mar 01, 2024  7:02 PM) I was unable to track any last fill of this. The patient stated he takes this intermittently.  Semaglutide , 1 MG/DOSE, (OZEMPIC , 1 MG/DOSE,) 4 MG/3ML SOPN [id number] Yes  INJECT 1 MG UNDER THE SKIN ONCE A WEEK AS DIRECTED Emilie Harden, MD  Active Self           Med Note Guido Leeks, [name]   Fri Mar 01, 2024  5:59 PM)    sodium chloride  flush (NS) 0.9 % injection 10 mL [id number]   [name] , MD  Active             Home Care and Equipment/Supplies: Were Home Health Services Ordered?: No Any new equipment or medical supplies ordered?: Yes Name of Medical supply agency?: Patient reports lift chair rental is being delivered today Were you able to get the equipment/medical supplies?: Yes Do you have any questions related to the use of the equipment/supplies?: No  Functional Questionnaire: Do you need assistance with bathing/showering or dressing?: No Do you need assistance with meal preparation?: No (Patient states friends/neighbors are bringing meals) Do you need assistance with eating?: No Do you have difficulty maintaining continence: No Do you need assistance with getting out of bed/getting out of a chair/moving?: No (Patient states he is using a walker) Do you have difficulty managing or taking your medications?: No  Follow up appointments reviewed: PCP Follow-up appointment confirmed?: Yes Date of PCP follow-up appointment?: 04/17/24 Follow-up Provider: PCP: [facility] Follow-up appointment confirmed?: Yes Date of Specialist follow-up appointment?: 04/23/24 Follow-Up Specialty Provider:: ID, Orlie Bjornstad 7/1 Do you need transportation to your follow-up appointment?: No Do you understand care options if your condition(s) worsen?: Yes-patient verbalized understanding  SDOH Interventions Today    Flowsheet Row Most Recent Value  SDOH Interventions   Food Insecurity Interventions Intervention Not Indicated  Housing Interventions Intervention Not Indicated  Transportation Interventions Intervention Not Indicated  Utilities Interventions Intervention Not Indicated    Goals Addressed             This Visit's  Progress    VBCI Transitions of Care (TOC) Care Plan       Problems:  Recent Hospitalization for treatment of Lumbar radiculopathy affecting patient's ambulation related to pain - Patient also reports infection and states his port was removed  Medication management barrier During medication review- patient was unaware of Lidocaine  patch or Pregabalin  Conference call was placed to [facility] on [address] in [place] with message left and patient received a call back advising him these 2 were on back order and will be ready for pickup today, 04/04/24 after lunch-   Goal:  Over the next 30 days, the patient will not experience hospital readmission  Interventions:  Transitions of Care: Doctor Visits  - discussed the importance of doctor visits Arranged PCP follow-up within 12-14 days (Care Guide Scheduled) Reviewed Signs and symptoms of infection Called [facility] to address patient's missing medications, Lidocaine  patch and Pregabalin  - patient will pick kup 04/04/24  AFIB Interventions:   Reviewed importance of adherence to anticoagulant exactly  as prescribed Counseled on bleeding risk associated with Eliquis  and importance of self-monitoring for signs/symptoms of bleeding   Diabetes Interventions: Assessed patient's understanding of A1c goal: <6.5% Reviewed medications with patient and discussed importance of medication adherence Discussed plans with patient for ongoing care management follow up and provided patient with direct contact information for care management team Reviewed scheduled/upcoming provider appointments including: PCP hospital follow up scheduled for 04/17/24, Current Oncology appointment scheduled 6/19 with infusion (patient is going to call to discuss if this will continue as planned since his port was removed during this hospitalization, Patient has Infectious Disease appointment scheduled 7/1 with Lela Purple  Lab Results  Component Value Date   HGBA1C 7.1 (A) 02/13/2024     Oncology: Assessment of understanding of oncology diagnosis:  Reviewed upcoming provider appointments and treatment appointments Assessed available transportation to appointments and treatments. Has consistent/reliable transportation: Yes Assessed support system. Has consistent/reliable family or other support: Yes  Pain Interventions: Pain assessment performed Medications reviewed Discussed importance of adherence to all scheduled medical appointments Counseled on the importance of reporting any/all new or changed pain symptoms or management strategies to pain management provider Reviewed with patient prescribed pharmacological and nonpharmacological pain relief strategies Assessed social determinant of health barriers  Patient Self Care Activities:  Attend all scheduled provider appointments Call pharmacy for medication refills 3-7 days in advance of running out of medications Call provider office for new concerns or questions  Notify RN Care Manager of TOC call rescheduling needs Participate in Transition of Care Program/Attend TOC scheduled calls Take medications as prescribed    Plan:  Telephone follow up appointment with care management team member scheduled for:  04/12/24 1pm with primary [facility] RN Laine Tousey The patient has been provided with contact information for the care management team and has been advised to call with any health related questions or concerns.         Unable to complete all assessments due to time on phone with patient focusing on medications, PCP followup etc.   Tonia Frankel RN, CCM [facility]  VBCI-Population Health RN Care Manager [phone]

## 2024-04-06 ENCOUNTER — Encounter: Payer: Self-pay | Admitting: Internal Medicine

## 2024-04-06 ENCOUNTER — Other Ambulatory Visit (HOSPITAL_COMMUNITY): Payer: Self-pay

## 2024-04-06 LAB — CULTURE, BLOOD (ROUTINE X 2)
Culture: NO GROWTH
Special Requests: ADEQUATE

## 2024-04-07 ENCOUNTER — Encounter: Payer: Self-pay | Admitting: Internal Medicine

## 2024-04-07 ENCOUNTER — Other Ambulatory Visit: Payer: Self-pay | Admitting: Internal Medicine

## 2024-04-07 MED ORDER — AMOXICILLIN-POT CLAVULANATE 875-125 MG PO TABS: 1.0000 | ORAL_TABLET | Freq: Two times a day (BID) | ORAL | 0 refills | Status: DC

## 2024-04-08 ENCOUNTER — Ambulatory Visit (INDEPENDENT_AMBULATORY_CARE_PROVIDER_SITE_OTHER): Payer: Medicare HMO

## 2024-04-08 VITALS — Ht 74.0 in | Wt 200.0 lb

## 2024-04-08 DIAGNOSIS — Z0100 Encounter for examination of eyes and vision without abnormal findings: Secondary | ICD-10-CM

## 2024-04-08 DIAGNOSIS — Z0000 Encounter for general adult medical examination without abnormal findings: Secondary | ICD-10-CM | POA: Diagnosis not present

## 2024-04-08 DIAGNOSIS — Z1159 Encounter for screening for other viral diseases: Secondary | ICD-10-CM | POA: Diagnosis not present

## 2024-04-08 NOTE — Progress Notes (Signed)
 Subjective:  Please attest and cosign this visit due to patients primary care provider not being in the office at the time the visit was completed.  (Pt of Dr A. Plotnikov)   Robert [name] is a 73 y.o. who presents for a Medicare Wellness preventive visit.  As a reminder, Annual Wellness Visits don't include a physical exam, and some assessments may be limited, especially if this visit is performed virtually. We may recommend an in-person follow-up visit with your provider if needed.  Visit Complete: Virtual I connected with  Malena Scull [name] on 04/08/24 by a audio enabled telemedicine application and verified that I am speaking with the correct person using two identifiers.  Patient Location: Home  Provider Location: Office/Clinic  I discussed the limitations of evaluation and management by telemedicine. The patient expressed understanding and agreed to proceed.  Vital Signs: Because this visit was a virtual/telehealth visit, some criteria may be missing or patient reported. Any vitals not documented were not able to be obtained and vitals that have been documented are patient reported.  VideoDeclined- This patient declined Librarian, academic. Therefore the visit was completed with audio only.  Persons Participating in Visit: Patient.  AWV Questionnaire: No: Patient Medicare AWV questionnaire was not completed prior to this visit.  Cardiac Risk Factors include: advanced age (>31men, >42 women);dyslipidemia;diabetes mellitus;hypertension;male gender     Objective:    Today's Vitals   04/08/24 1133  Weight: 200 lb (90.7 kg)  Height: 6' 2 (1.88 m)  PainSc: 7    Body mass index is 25.68 kg/m.     04/08/2024   11:33 AM 03/27/2024    1:30 PM 03/15/2024    1:52 PM 03/01/2024    3:26 PM 03/01/2024   11:48 AM 02/05/2024   12:14 AM 02/04/2024    1:45 PM  Advanced Directives  Does Patient Have a Medical Advance Directive? Yes Yes Yes  No Yes No   Type of Estate agent of [place];Living will Healthcare Power of [place];Living will Healthcare Power of [place];Living will   Healthcare Power of [place];Living will   Does patient want to make changes to medical advance directive?  No - Patient declined No - Patient declined   No - Patient declined   Copy of Healthcare Power of Attorney in Chart? No - copy requested No - copy requested No - copy requested   No - copy requested   Would patient like information on creating a medical advance directive?    No - Guardian declined  No - Patient declined No - Patient declined    Current Medications (verified) Outpatient Encounter Medications as of 04/08/2024  Medication Sig   amoxicillin -clavulanate (AUGMENTIN) 875-125 MG tablet Take 1 tablet by mouth 2 (two) times daily.   apixaban  (ELIQUIS ) 5 MG TABS tablet Take 1 tablet (5 mg total) by mouth 2 (two) times daily.   atorvastatin  (LIPITOR) 40 MG tablet Take 1 tablet (40 mg total) by mouth at bedtime.   B Complex-C (B-COMPLEX WITH VITAMIN C) tablet Take 1 tablet by mouth 3 (three) times a week.   Cholecalciferol (VITAMIN D-3 PO) Take 1 tablet by mouth 3 (three) times a week.   EPINEPHrine  0.3 mg/0.3 mL IJ SOAJ injection Inject 0.3 mg into the muscle as needed for anaphylaxis.   glucose blood test strip 1 each by Other route daily. Use daily for glucose control - Dx 250.00   lidocaine  (LIDODERM ) 5 % Place 1 patch onto the skin  daily. Remove & Discard patch within 12 hours or as directed by MD   methocarbamol  (ROBAXIN ) 500 MG tablet Take 1 tablet (500 mg total) by mouth every 8 (eight) hours as needed for muscle spasms.   methylPREDNISolone  (MEDROL  DOSEPAK) 4 MG TBPK tablet Take as directed   metoprolol  succinate (TOPROL -XL) 25 MG 24 hr tablet Take 1 tablet (25 mg total) by mouth daily.   oxyCODONE  (OXY IR/ROXICODONE ) 5 MG immediate release tablet Take 1 tablet (5 mg total) by mouth every 6 (six) hours as needed for moderate  pain (pain score 4-6).   pregabalin  (LYRICA ) 75 MG capsule Take 1 capsule (75 mg total) by mouth daily.   propranolol  (INDERAL ) 10 MG tablet TAKE 1 TABLET BY MOUTH 4 TIMES DAILY AS NEEDED FOR PALPITATIONS, IRREGULAR OR FAST HEART RATE   Semaglutide , 1 MG/DOSE, (OZEMPIC , 1 MG/DOSE,) 4 MG/3ML SOPN INJECT 1 MG UNDER THE SKIN ONCE A WEEK AS DIRECTED   Facility-Administered Encounter Medications as of 04/08/2024  Medication   sodium chloride  flush (NS) 0.9 % injection 10 mL    Allergies (verified) Lisinopril    History: Past Medical History:  Diagnosis Date   Anticoagulant long-term use    eliquis --- managed by cardiology   Arthritis    Bladder cancer [facility])    urologist--- dr Derrick Fling;  overlapping   Cataract    History of basal cell carcinoma (BCC) excision    History of COVID-19 05/2019   per pt mild symptoms that resolved   HLD (hyperlipidemia)    HTN (hypertension)    Hx of colonic polyps    Insulin  dependent type 2 diabetes mellitus [facility])    endocrinologist--- dr Aldona Amel --  (03-11-2022 per pt check blood sugar 1-2 times daily,  fasting sugar-- 118-120s)   Nocturia    Paroxysmal atrial fibrillation (HCC) 03/12/2019   cardiologist--- dr Floria Hurst   Renal disorder    Ureterocele    Use of cane as ambulatory aid    Past Surgical History:  Procedure Laterality Date   BLEPHAROPLASTY Bilateral    per pt approx  2001;   upper eyelid's   COLONOSCOPY     last one approx 2023   CYSTOSCOPY W/ RETROGRADES N/A 12/08/2023   Procedure: CYSTOSCOPY;  Surgeon: Melody Spurling., MD;  Location: [facility];  Service: Urology;  Laterality: N/A;  390 MINUTES NEEDED FOR CASE   CYSTOSCOPY WITH URETEROSCOPY AND STENT PLACEMENT Right 12/02/2022   Procedure: CYSTOSCOPY WITH RIGHT URETEROSCOPY, RIGHT DIAGNOSTIC URETEROSCOPY;  Surgeon: Christina Coyer, MD;  Location: [facility];  Service: Urology;  Laterality: Right;  90 MINS FOR CASE   CYSTOSCOPY/RETROGRADE/URETEROSCOPY Bilateral 05/05/2023   Procedure:  CYSTOSCOPY BILATERAL RETROGRADE PYELOGRAM RIGHT URETEROSCOPY RIGHT STENT PLACEMENT;  Surgeon: Christina Coyer, MD;  Location: [facility];  Service: Urology;  Laterality: Bilateral;  90 MINS FOR CASE   IR IMAGING GUIDED PORT INSERTION  05/30/2023   IR REMOVAL TUN ACCESS W/ PORT W/O FL MOD SED  03/29/2024   PATELLAR TENDON REPAIR Left 04/16/2004   @[facility]    ROBOT ASSITED LAPAROSCOPIC NEPHROURETERECTOMY Right 12/08/2023   Procedure: RIGHT ROBOT ASSISTED Nephroureterectomy;  Surgeon: Melody Spurling., MD;  Location: [facility];  Service: Urology;  Laterality: Right;   ROBOT LAP RADICAL CYSTOPROSTATECTOMY PELVIC LYMPHADENECTOMY, NEOBLADDER  12/08/2023   Procedure: ROBOT ASSISTED LAPAROSCOPIC RADICAL CYSTOPROSTATECTOMY;  Surgeon: Melody Spurling., MD;  Location: [facility];  Service: Urology;;   TOTAL URETHRECTOMY,RADICAL N/A 12/08/2023   Procedure: TOTAL URETHRECTOMY,RADICAL;  Surgeon: Melody Spurling.,  MD;  Location: [facility];  Service: Urology;  Laterality: N/A;   TRANSESOPHAGEAL ECHOCARDIOGRAM (CATH LAB) N/A 04/02/2024   Procedure: TRANSESOPHAGEAL ECHOCARDIOGRAM;  Surgeon: Loyde Rule, MD;  Location: [facility];  Service: Cardiovascular;  Laterality: N/A;   TRANSURETHRAL RESECTION OF BLADDER TUMOR N/A 06/18/2021   Procedure: TRANSURETHRAL RESECTION OF BLADDER TUMOR (TURBT) WITH RIGHT URETERAL STENT PLACEMENT, RIGHT URETEROSCOPY WITH DISTRACTION OF TUMOR, FULGURATION;  Surgeon: Christina Coyer, MD;  Location: [facility];  Service: Urology;  Laterality: N/A;   TRANSURETHRAL RESECTION OF BLADDER TUMOR N/A 03/15/2022   Procedure: TRANSURETHRAL RESECTION OF BLADDER TUMOR (TURBT) WITH BILATERAL URETEROSCOPY/URETHRAL/ DILATION/BILATERAL RETROGRADE PYELOGRAM/BIOPSY AND FULGURATION OF RIGHT URETERAL CANCER/RIGHT STENT PLACEMENT;  Surgeon: Christina Coyer, MD;  Location: [facility];  Service: Urology;  Laterality: N/A;   TRANSURETHRAL RESECTION OF  BLADDER TUMOR N/A 05/05/2023   Procedure: TRANSURETHRAL RESECTION OF BLADDER TUMOR (TURBT) BLADDER BIOSPY;  Surgeon: Christina Coyer, MD;  Location: [facility];  Service: Urology;  Laterality: N/A;   TRANSURETHRAL RESECTION OF BLADDER TUMOR WITH MITOMYCIN -C N/A 12/02/2022   Procedure: TRANSURETHRAL RESECTION OF BLADDER TUMOR;  Surgeon: Christina Coyer, MD;  [facility];  Service: Urology;  Laterality: N/A;   TRANSURETHRAL RESECTION OF PROSTATE  05/05/2023   Procedure: TRANSURETHRAL RESECTION OF THE PROSTATE (TURP);  Surgeon: Christina Coyer, MD;  Location: [facility];  Service: Urology;;   Family History  Problem Relation Age of Onset   Pancreatic cancer Mother 59   Cancer Paternal Uncle        unknown type cancer   Aortic aneurysm Other        family history   Cancer Other        Breast (1st degree relative)   Colon cancer Neg Hx    Rectal cancer Neg Hx    Stomach cancer Neg Hx    Esophageal cancer Neg Hx    Social History   Socioeconomic History   Marital status: Married    Spouse name: Not on file   Number of children: 1   Years of education: Not on file   Highest education level: Some college, no degree  Occupational History   Occupation: DVD's   Occupation: Occupational hygienist: Self Employed  Tobacco Use   Smoking status: Some Days    Current packs/day: 0.25    Average packs/day: 0.3 packs/day for 13.5 years (3.6 ttl pk-yrs)    Types: Cigars, Cigarettes    Start date: 2012    Last attempt to quit: 02/22/2011   Smokeless tobacco: Never  Vaping Use   Vaping status: Never Used  Substance and Sexual Activity   Alcohol  use: Yes    Alcohol /week: 3.0 standard drinks of alcohol     Types: 3 Shots of liquor per week    Comment: 6-8oz /wk OCC MARTINI 2 TO 3 X WEEK   Drug use: Never   Sexual activity: Not Currently  Other Topics Concern   Not on file  Social History Narrative   Patient is married he has been employed in  Chief Financial Officer and Airline pilot   Current Smoker   Social Drivers of Health   Financial Resource Strain: Low Risk  (04/08/2024)   Overall Financial Resource Strain (CARDIA)    Difficulty of Paying Living Expenses: Not hard at all  Food Insecurity: No Food Insecurity (04/08/2024)   Hunger Vital Sign    Worried About Running Out of Food in the Last Year: Never true  Ran Out of Food in the Last Year: Never true  Transportation Needs: No Transportation Needs (04/08/2024)   PRAPARE - Administrator, Civil Service (Medical): No    Lack of Transportation (Non-Medical): No  Physical Activity: Inactive (04/08/2024)   Exercise Vital Sign    Days of Exercise per Week: 0 days    Minutes of Exercise per Session: 0 min  Stress: Stress Concern Present (04/08/2024)   [employer] of Occupational Health - Occupational Stress Questionnaire    Feeling of Stress: To some extent  Social Connections: Moderately Integrated (04/08/2024)   Social Connection and Isolation Panel    Frequency of Communication with Friends and Family: More than three times a week    Frequency of Social Gatherings with Friends and Family: Three times a week    Attends Religious Services: More than 4 times per year    Active Member of Clubs or Organizations: No    Attends Banker Meetings: Never    Marital Status: Married  Recent Concern: Social Connections - Moderately Isolated (03/28/2024)   Social Connection and Isolation Panel    Frequency of Communication with Friends and Family: More than three times a week    Frequency of Social Gatherings with Friends and Family: Twice a week    Attends Religious Services: Never    Database administrator or Organizations: No    Attends Engineer, structural: Never    Marital Status: Married    Tobacco Counseling Ready to quit: Yes Counseling given: Yes    Clinical Intake:  Pre-visit preparation completed: Yes  Pain : 0-10 (7) Pain Score: 7  Pain Type:  Acute pain Pain Location: Back Pain Orientation: Lower Pain Descriptors / Indicators: Constant Pain Onset: 1 to 4 weeks ago Pain Frequency: Intermittent Pain Relieving Factors: Oxycodone   Pain Relieving Factors: Oxycodone   BMI - recorded: 25.68 Nutritional Status: BMI 25 -29 Overweight Nutritional Risks: None Diabetes: Yes CBG done?: No Did pt. bring in CBG monitor from home?: No  Lab Results  Component Value Date   HGBA1C 7.1 (A) 02/13/2024   HGBA1C 5.9 (A) 10/05/2023   HGBA1C 6.5 (A) 06/12/2023     How often do you need to have someone help you when you read instructions, pamphlets, or other written materials from your doctor or pharmacy?: 1 - Never  Interpreter Needed?: No  Information entered by :: Kandy Orris, CMA   Activities of Daily Living     04/08/2024   11:36 AM 03/28/2024    2:00 PM  In your present state of health, do you have any difficulty performing the following activities:  Hearing? 0 0  Vision? 0 0  Difficulty concentrating or making decisions? 0 0  Walking or climbing stairs? 1   Comment using a walker- back pain   Dressing or bathing? 0   Doing errands, shopping? 0   Preparing Food and eating ? N   Using the Toilet? N   In the past six months, have you accidently leaked urine? N   Do you have problems with loss of bowel control? N   Managing your Medications? N   Managing your Finances? N   Housekeeping or managing your Housekeeping? N     Patient Care Team: Plotnikov, Oakley Bellman, MD as PCP - General (Internal Medicine) Nahser, Lela Purple, MD as PCP - Cardiology (Cardiology) Alver Austin, [facility] (Inactive) as Pharmacist (Pharmacist) Emilie Harden, MD as Consulting Physician (Internal Medicine) Christina Coyer, MD as Consulting  Physician (Urology) Kenney Peacemaker, MD as Consulting Physician (Gastroenterology) Rudine Cos, MD as Consulting Physician (Ophthalmology) [name] [place], MD as Attending Physician (Hematology and  Oncology) Billie Budge, RN as [facility], [place], [place] as [facility] Care Management Tousey, Laine M, RN as [facility] Care Management  I have updated your Care Teams any recent Medical Services you may have received from other providers in the past year.     Assessment:   This is a routine wellness examination for Justis.  Hearing/Vision screen Hearing Screening - Comments:: Denies hearing difficulties   Vision Screening - Comments:: Wears rx glasses - referral to Dr Roslynn Coombes   Goals Addressed               This Visit's Progress     Patient Stated (pt-stated)        Patient stated he plans to stay out of the hospital       Depression Screen     04/08/2024   11:38 AM 03/26/2024    3:21 PM 03/15/2024    1:51 PM 03/11/2024   11:10 AM 02/16/2024   11:00 AM 10/09/2023    2:34 PM 07/03/2023    2:44 PM  PHQ 2/9 Scores  PHQ - 2 Score 1 1 1 1  0 0 0  PHQ- 9 Score 4          Fall Risk     04/08/2024   11:38 AM 03/26/2024    3:09 PM 03/15/2024    1:43 PM 10/09/2023    2:34 PM 07/03/2023    2:44 PM  Fall Risk   Falls in the past year? 0 0 0 1 0  Number falls in past yr: 0   1 0  Injury with Fall? 0   1 0  Risk for fall due to : No Fall Risks   Impaired balance/gait;History of fall(s) No Fall Risks  Follow up Falls evaluation completed;Falls prevention discussed   Falls evaluation completed Falls evaluation completed    MEDICARE RISK AT HOME:  Medicare Risk at Home Any stairs in or around the home?: Yes If so, are there any without handrails?: No Home free of loose throw rugs in walkways, pet beds, electrical cords, etc?: Yes Adequate lighting in your home to reduce risk of falls?: Yes Life alert?: No Use of a cane, walker or w/c?: No Grab bars in the bathroom?: No Shower chair or bench in shower?: No Elevated toilet seat or a handicapped toilet?: No  TIMED UP AND GO:  Was the test performed?  No  Cognitive Function: 6CIT completed        04/08/2024    11:41 AM 03/31/2023   10:55 AM 04/21/2022    3:12 PM  6CIT Screen  What Year? 0 points 0 points 0 points  What month? 0 points 0 points 0 points  What time? 0 points 0 points 0 points  Count back from 20 0 points 0 points 0 points  Months in reverse 0 points 0 points 0 points  Repeat phrase 2 points 0 points 0 points  Total Score 2 points 0 points 0 points    Immunizations Immunization History  Administered Date(s) Administered   Fluad Quad(high Dose 65+) 07/02/2019, 08/07/2020, 08/09/2021   Fluad Trivalent(High Dose 65+) 08/10/2023   Influenza Split 09/26/2011, 11/26/2012   Influenza Whole 08/21/2007   Influenza, High Dose Seasonal PF 07/10/2017, 07/23/2018   Influenza,inj,Quad PF,6+ Mos 08/06/2013, 07/28/2015   PFIZER(Purple Top)SARS-COV-2 Vaccination 01/24/2020, 02/17/2020, 09/26/2020  Pneumococcal Conjugate-13 03/01/2017   Pneumococcal Polysaccharide-23 07/23/2018   Td 08/21/2007   Zoster, Live 02/05/2013    Screening Tests Health Maintenance  Topic Date Due   Hepatitis C Screening  Never done   DTaP/Tdap/Td (2 - Tdap) 08/20/2017   OPHTHALMOLOGY EXAM  12/25/2022   COVID-19 Vaccine (4 - 2024-25 season) 06/25/2023   INFLUENZA VACCINE  05/24/2024   FOOT EXAM  06/11/2024   HEMOGLOBIN A1C  08/14/2024   Diabetic kidney evaluation - Urine ACR  02/12/2025   Diabetic kidney evaluation - eGFR measurement  04/03/2025   Medicare Annual Wellness (AWV)  04/08/2025   Colonoscopy  06/18/2027   Pneumococcal Vaccine: 50+ Years  Completed   HPV VACCINES  Aged Out   Meningococcal B Vaccine  Aged Out   Zoster Vaccines- Shingrix  Discontinued    Health Maintenance  Health Maintenance Due  Topic Date Due   Hepatitis C Screening  Never done   DTaP/Tdap/Td (2 - Tdap) 08/20/2017   OPHTHALMOLOGY EXAM  12/25/2022   COVID-19 Vaccine (4 - 2024-25 season) 06/25/2023   Health Maintenance Items Addressed:  Referral sent to Optometry/Ophthalmology, Labs Ordered: Hepatitis C  Screening  Additional Screening:  Vision Screening: Recommended annual ophthalmology exams for early detection of glaucoma and other disorders of the eye. Would you like a referral to an eye doctor? Yes  to Dr Roslynn Coombes  Dental Screening: Recommended annual dental exams for proper oral hygiene  Community Resource Referral / Chronic Care Management: CRR required this visit?  No   CCM required this visit?  No   Plan:    I have personally reviewed and noted the following in the patient's chart:   Medical and social history Use of alcohol , tobacco or illicit drugs  Current medications and supplements including opioid prescriptions. Patient is currently taking opioid prescriptions. Information provided to patient regarding non-opioid alternatives. Patient advised to discuss non-opioid treatment plan with their provider. Functional ability and status Nutritional status Physical activity Advanced directives List of other physicians Hospitalizations, surgeries, and ER visits in previous 12 months Vitals Screenings to include cognitive, depression, and falls Referrals and appointments  In addition, I have reviewed and discussed with patient certain preventive protocols, quality metrics, and best practice recommendations. A written personalized care plan for preventive services as well as general preventive health recommendations were provided to patient.   Patria Bookbinder, CMA   04/08/2024   After Visit Summary: (MyChart) Due to this being a telephonic visit, the after visit summary with patients personalized plan was offered to patient via MyChart   Notes: Nothing significant to report at this time.

## 2024-04-08 NOTE — Patient Instructions (Addendum)
 Robert [name] , Thank you for taking time out of your busy schedule to complete your Annual Wellness Visit with me. I enjoyed our conversation and look forward to speaking with you again next year. I, as well as your care team,  appreciate your ongoing commitment to your health goals. Please review the following plan we discussed and let me know if I can assist you in the future. Your Game plan/ To Do List    Referrals: If you haven't heard from the office you've been referred to, please reach out to them at the phone provided.  Referral to Dr Roslynn Coombes (Ophthalmologist) for an eye exam; Ordered a Hepatitis C Screening Follow up Visits: Next Medicare AWV with our clinical staff: 04/10/2025   Have you seen your provider in the last 6 months (3 months if uncontrolled diabetes)? Yes Next Office Visit with your provider: 04/17/2024  Clinician Recommendations:  Aim for 30 minutes of exercise or brisk walking, 6-8 glasses of water , and 5 servings of fruits and vegetables each day. Educated and advised on getting the COVID and Tdap (Tetenus) vaccines in 2025 at local pharmacy.      This is a list of the screening recommended for you and due dates:  Health Maintenance  Topic Date Due   Hepatitis C Screening  Never done   DTaP/Tdap/Td vaccine (2 - Tdap) 08/20/2017   Eye exam for diabetics  12/25/2022   COVID-19 Vaccine (4 - 2024-25 season) 06/25/2023   Flu Shot  05/24/2024   Complete foot exam   06/11/2024   Hemoglobin A1C  08/14/2024   Yearly kidney health urinalysis for diabetes  02/12/2025   Yearly kidney function blood test for diabetes  04/03/2025   Medicare Annual Wellness Visit  04/08/2025   Colon Cancer Screening  06/18/2027   Pneumococcal Vaccine for age over 72  Completed   HPV Vaccine  Aged Out   Meningitis B Vaccine  Aged Out   Zoster (Shingles) Vaccine  Discontinued    Advanced directives: (Copy Requested) Please bring a copy of your health care power of attorney and living will to the  office to be added to your chart at your convenience. You can mail to [facility] [address] [place], [place] [address] or email to [email or web address] Advance Care Planning is important because it:  [x]  Makes sure you receive the medical care that is consistent with your values, goals, and preferences  [x]  It provides guidance to your family and loved ones and reduces their decisional burden about whether or not they are making the right decisions based on your wishes.  Follow the link provided in your after visit summary or read over the paperwork we have mailed to you to help you started getting your Advance Directives in place. If you need assistance in completing these, please reach out to us  so that we can help you!

## 2024-04-09 ENCOUNTER — Other Ambulatory Visit: Payer: Self-pay

## 2024-04-09 ENCOUNTER — Telehealth: Payer: Self-pay | Admitting: Internal Medicine

## 2024-04-09 NOTE — Telephone Encounter (Signed)
 Copied from CRM [id number]. Topic: Clinical - Medication Refill >> Apr 09, 2024  8:23 AM Alica Antu wrote: Medication: oxyCODONE  (OXY IR/ROXICODONE ) 5 MG immediate release tablet  This medication was given in ER and patient was told that PCP would  have to refill.   Has the patient contacted their pharmacy? Yes (Agent: If no, request that the patient contact the pharmacy for the refill. If patient does not wish to contact the pharmacy document the reason why and proceed with request.) (Agent: If yes, when and what did the pharmacy advise?)  This is the patient's preferred pharmacy:  [id number] [place]le, [place] - [address] [place] [address] Phone: [phone] Fax: [phone]  Is this the correct pharmacy for this prescription? Yes If no, delete pharmacy and type the correct one.   Has the prescription been filled recently? Yes  Is the patient out of the medication? Yes  Has the patient been seen for an appointment in the last year OR does the patient have an upcoming appointment? Yes  Can we respond through MyChart? Yes  Agent: Please be advised that Rx refills may take up to 3 business days. We ask that you follow-up with your pharmacy.

## 2024-04-10 ENCOUNTER — Other Ambulatory Visit: Payer: Self-pay | Admitting: Nurse Practitioner

## 2024-04-10 ENCOUNTER — Other Ambulatory Visit: Payer: Self-pay | Admitting: Internal Medicine

## 2024-04-10 ENCOUNTER — Encounter: Payer: Self-pay | Admitting: Hematology

## 2024-04-10 DIAGNOSIS — C679 Malignant neoplasm of bladder, unspecified: Secondary | ICD-10-CM

## 2024-04-10 DIAGNOSIS — Z8551 Personal history of malignant neoplasm of bladder: Secondary | ICD-10-CM | POA: Diagnosis not present

## 2024-04-10 DIAGNOSIS — Z936 Other artificial openings of urinary tract status: Secondary | ICD-10-CM | POA: Diagnosis not present

## 2024-04-10 MED ORDER — FUROSEMIDE 20 MG PO TABS: 20.0000 mg | ORAL_TABLET | Freq: Every day | ORAL | 3 refills | Status: DC | PRN

## 2024-04-10 NOTE — Assessment & Plan Note (Addendum)
 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 -He is clinically doing well, asymptomatic.  Surgical resection was discussed with patient, but he would like to try nonsurgical options. -He started Keytruda  on 01/12/23, stopped in June 2024 due to disease progression. 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 -I recommended neoadjuvant chemotherapy with cisplatin  and gemcitabine  for 4 cycles, followed by total cystectomy and prostatectomy. -He started chemotherapy on June 08, 2023, and completed on 08/17/2023, overall tolerated well  -He underwent right robotic right nephroureterectomy, robotic radical cystectomy with total urethrectomy, bilateral pelvic lymph node dissection, and left cutaneous ureterostomy by Dr. Alvaro on 12/08/2023.. surgical path showed multifocal high-grade urothelial carcinoma, right renal pelvis, ureter, bladder, and prostatic urethra, surgical margines negative, 1 positive node  -He started adjuvant Nivo on 02/15/2024 - 04/11/2024 -presents for cycle 1 day 1 of nivolumab  injection.  Treatment every 2 weeks.

## 2024-04-10 NOTE — Progress Notes (Addendum)
 Patient Care Team: Plotnikov, Robert GAILS, MD as PCP - General (Internal Medicine) Nahser, Robert PARAS, MD as PCP - Cardiology (Cardiology) Liane Robert [name], [facility] (Inactive) as Pharmacist (Pharmacist) Robert File, MD as Consulting Physician (Internal Medicine) Robert Cough, MD as Consulting Physician (Urology) Avram Robert BRAVO, MD as Consulting Physician (Gastroenterology) Robert Sharper, MD as Consulting Physician (Ophthalmology) Robert Callander, MD as Attending Physician (Hematology and Oncology) Robert Heddy HERO, RN as [facility], [place], [place] as [facility] Care Management Robert [name], Robert M, RN as [facility] Care Management  Clinic Day:  04/11/2024  Referring physician: Garald Robert GAILS, MD  ASSESSMENT & PLAN:   Assessment & Plan: Bladder cancer [facility]) Bladder cancer (HCC) -cT2N0M0 stage [name] diagnosed in 04/2023 -He has had 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 -He is clinically doing well, asymptomatic.  Surgical resection was discussed with patient, but he would like to try nonsurgical options. -He started Keytruda  on 01/12/23, stopped in June 2024 due to disease progression. 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 -I recommended neoadjuvant chemotherapy with cisplatin  and gemcitabine  for 4 cycles, followed by total cystectomy and prostatectomy. -He started chemotherapy on June 08, 2023, and completed on 08/17/2023, overall  tolerated well  -He underwent right robotic right nephroureterectomy, robotic radical cystectomy with total urethrectomy, bilateral pelvic lymph node dissection, and left cutaneous ureterostomy by Dr. Alvaro on 12/08/2023.. surgical path showed multifocal high-grade urothelial carcinoma, right renal pelvis, ureter, bladder, and prostatic urethra, surgical margines negative, 1 positive node  -He started adjuvant Nivo on 02/15/2024 - 04/11/2024 -presents for cycle 1 day 1 of nivolumab  injection.  Treatment every 2 weeks.   Hypomagnesemia Magnesium  1.6 on labs today.  Recommend initiation of OTC magnesium  oral supplement 200 mg daily.  Recheck with next visit.  Consider treatment with IV magnesium  if needed.  Right hip and low back pain Patient with known, severe osteoarthritis of right hip.  Recent ED visit due to severe pain in the right hip.  Also has multilevel degenerative disc disease for which she has moderate to severe low back pain.  Walking with a walker.  Does have upcoming appointment with orthopedics to help manage pain.  Bacteremia Patient recently hospitalized for bacteremia.  Port-A-Cath removed, as it was believed to be because of significant infection.  He is currently on Augmentin  twice daily and continues to see infectious disease.  Plan Labs reviewed. - Mildly elevated WBC at 16.2 with ANC 12.2.  Currently on Augmentin  twice daily for bacteremia.  Will continue as prescribed.  Follow-up with ID as scheduled. - Mild and stable anemia with Hgb 10.9 and HCT 32.0. - Sodium 129, GFR 57, mag 1.6.  Recommend aggressive oral hydration.  Start oral magnesium  supplementation 200 mg daily.  This can be purchased OTC.  Recheck in 2 weeks.  Treat with IV fluids and magnesium  supplementation as indicated. Patient labs and presentation are adequate to proceed with cycle 1 day 1 nivolumab  injection. Labs, follow-up, and cycle 2 of nivolumab  injection as scheduled.  The patient  understands the  plans discussed today and is in agreement with them.  He knows to contact our office if he develops concerns prior to his next appointment.  I provided 30 minutes of face-to-face time during this encounter and > 50% was spent counseling as documented under my assessment and plan.    Robert FORBES Lessen, NP  [facility] - A DEPT OF [facility] [address] [place] [address] Dept: [phone] Dept Fax: [phone]   No orders of the defined types were placed in this encounter.     CHIEF COMPLAINT:  CC: Bladder cancer  Current Treatment: Nivolumab  subcutaneous route  INTERVAL HISTORY:  Robert [name] is here today for repeat clinical assessment.  He was last seen by Dr. Lanny on 03/14/2024.  Got initial dose nivolumab  02/15/2024 and had only intermittent fatigue.  He was hospitalized on 03/01/2024 for infection.  Hospitalized again for bacteremia on 03/27/2024.  His port was subsequently removed.  Today, 04/11/2024, he will receive nivolumab  via subcutaneous route for the first time.  He does report significant right hip pain and low back pain.  Using a walker to help with ambulation.  Recently seen in ED due to severe right hip pain.  Was given oxycodone  to help with pain.  He does have appointment with his orthopedic for further evaluation.  States he has experienced some mild constipation since treatment with narcotic pain medication.  Will take Senokot if needed to prevent/limit constipation.  He denies chest pain, chest pressure, or shortness of breath. He denies headaches or visual disturbances. He denies abdominal pain, nausea, vomiting, or changes in bowel or bladder habits. His appetite is good. His weight has been stable.  I have reviewed the past medical history, past surgical history, social history and family history with the patient and they are unchanged from previous note.  ALLERGIES:  is allergic to  lisinopril .  MEDICATIONS:  Current Outpatient Medications  Medication Sig Dispense Refill   amoxicillin -clavulanate (AUGMENTIN ) 875-125 MG tablet Take 1 tablet by mouth 2 (two) times daily. 14 tablet 0   apixaban  (ELIQUIS ) 5 MG TABS tablet Take 1 tablet (5 mg total) by mouth 2 (two) times daily. 180 tablet 1   atorvastatin  (LIPITOR) 40 MG tablet Take 1 tablet (40 mg total) by mouth at bedtime.     B Complex-C (B-COMPLEX WITH VITAMIN C) tablet Take 1 tablet by mouth 3 (three) times a week.     Cholecalciferol (VITAMIN D-3 PO) Take 1 tablet by mouth 3 (three) times a week.     EPINEPHrine  0.3 mg/0.3 mL IJ SOAJ injection Inject 0.3 mg into the muscle as needed for anaphylaxis. 1 each 0   furosemide  (LASIX ) 20 MG tablet Take 1-2 tablets (20-40 mg total) by mouth daily as needed. (Patient taking differently: Take 20-40 mg by mouth daily as needed for edema. 04/16/24: Reports during TOC call, a doctor (unsure which doctor) told him he could take up to 3 pills at a time- reports taking between 1-3 pills prn accordingly; reports currently taking about every day) 60 tablet 3   glucose blood test strip 1 each by Other route daily. Use daily for glucose control - Dx 250.00 100 each 0   lidocaine  (LIDODERM ) 5 % Place 1 patch onto the skin daily. Remove & Discard patch within 12 hours or as directed by MD 30 patch 0   methocarbamol  (ROBAXIN ) 500 MG tablet Take 1 tablet (500 mg total)  by mouth every 8 (eight) hours as needed for muscle spasms. 60 tablet 0   metoprolol  succinate (TOPROL -XL) 25 MG 24 hr tablet Take 1 tablet (25 mg total) by mouth daily. 90 tablet 2   oxyCODONE  (OXY IR/ROXICODONE ) 5 MG immediate release tablet Take 1 tablet (5 mg total) by mouth every 6 (six) hours as needed for severe pain (pain score 7-10) (severe low back pain). 100 tablet 0   pregabalin  (LYRICA ) 75 MG capsule Take 1 capsule (75 mg total) by mouth daily. 30 capsule 0   propranolol  (INDERAL ) 10 MG tablet TAKE 1 TABLET BY  MOUTH 4 TIMES DAILY AS NEEDED FOR PALPITATIONS, IRREGULAR OR FAST HEART RATE 270 tablet 1   Semaglutide , 1 MG/DOSE, (OZEMPIC , 1 MG/DOSE,) 4 MG/3ML SOPN INJECT 1 MG UNDER THE SKIN ONCE A WEEK AS DIRECTED 9 mL 3   No current facility-administered medications for this visit.   Facility-Administered Medications Ordered in Other Visits  Medication Dose Route Frequency Provider Last Rate Last Admin   sodium chloride  flush (NS) 0.9 % injection 10 mL  10 mL Intracatheter PRN Robert Callander, MD   10 mL at 02/15/24 1353    HISTORY OF PRESENT ILLNESS:   Oncology History Overview Note   Cancer Staging  Bladder cancer [facility]) Staging form: Urinary Bladder, AJCC 8th Edition - Clinical stage from 05/14/2021: Stage 0a (cTa, cN0, cM0) - Signed by Robert Callander, MD on 01/02/2023 Stage prefix: Initial diagnosis WHO/ISUP grade (low/high): High Grade Histologic grading system: 2 grade system - Pathologic stage from 02/22/2022: Stage I (pT1, pN0, cM0) - Signed by Robert Callander, MD on 01/02/2023 WHO/ISUP grade (low/high): High Grade Histologic grading system: 2 grade system     Bladder cancer (HCC)  05/14/2021 Cancer Staging   Staging form: Urinary Bladder, AJCC 8th Edition - Clinical stage from 05/14/2021: Stage 0a (cTa, cN0, cM0) - Signed by Robert Callander, MD on 01/02/2023 Stage prefix: Initial diagnosis WHO/ISUP grade (low/high): High Grade Histologic grading system: 2 grade system   01/31/2022 Initial Diagnosis   Bladder cancer (HCC)   02/22/2022 Cancer Staging   Staging form: Urinary Bladder, AJCC 8th Edition - Pathologic stage from 02/22/2022: Stage I (pT1, pN0, cM0) - Signed by Robert Callander, MD on 01/02/2023 WHO/ISUP grade (low/high): High Grade Histologic grading system: 2 grade system   01/12/2023 - 04/06/2023 Chemotherapy   Patient is on Treatment Plan : BLADDER Pembrolizumab  (200) q21d     05/05/2023 Pathology Results     FINAL MICROSCOPIC DIAGNOSIS:   A. BLADDER, RIGHT POSTERIOR, TURBT:  Noninvasive high grade  papillary urothelial carcinoma  Muscularis propria (detrusor muscle) is not present   B. BLADDER NECK AND PROSTATIC URETHRA, BIOPSY:  High grade urothelial carcinoma  The carcinoma focally invades muscularis propria (detrusor muscle)     05/05/2023 Cancer Staging   Staging form: Urinary Bladder, AJCC 8th Edition - Clinical stage from 05/05/2023: Stage [name] (cT2, cN0, cM0) - Signed by Robert Callander, MD on 06/15/2023 WHO/ISUP grade (low/high): High Grade Histologic grading system: 2 grade system   06/01/2023 Imaging   CT Chest, Abdomen, and Pelvis with contrast  IMPRESSION: 1. Similar irregular asymmetric wall thickening along the posterior urinary bladder with progressive irregular thickening of the right ureter extending to the level of the renal pelvis where there is now a masslike 16 x 12 mm soft tissue focus, suspicious for direct ureteral extension of disease into the renal pelvis. 2. Irregular asymmetric wall thickening along the posterior urinary bladder measuring 14 mm is similar prior. 3.  Right double-J ureteral stent is in place with persistent perinephric/periureteric stranding without significant hydronephrosis. 4. No convincing evidence of metastatic disease in the chest, abdomen or pelvis 5. Pancolonic diverticulosis without findings of acute diverticulitis. 6. Aortic Atherosclerosis (ICD10-I70.0) and Emphysema (ICD10-J43.9).   06/09/2023 - 08/17/2023 Chemotherapy   Patient is on Treatment Plan : BLADDER Cisplatin  D1 + Gemcitabine  D1,8 q21d x 4 Cycles     02/15/2024 -  Chemotherapy   Patient is on Treatment Plan : BLADDER Nivolumab  (480) q28d         REVIEW OF SYSTEMS:   Constitutional: Denies fevers, chills or abnormal weight loss.  Intermittent fatigue which is gradually improving. Eyes: Denies blurriness of vision Ears, nose, mouth, throat, and face: Denies mucositis or sore throat Respiratory: Denies [name], dyspnea or wheezes Cardiovascular: Denies palpitation, chest  discomfort or lower extremity swelling Gastrointestinal:  Denies nausea, heartburn or change in bowel habits.  Mild constipation.  Will take Senokot as needed. Skin: Denies abnormal skin rashes Lymphatics: Denies new lymphadenopathy or easy bruising Neurological:Denies numbness, tingling or new weaknesses Behavioral/Psych: Mood is stable, no new changes  All other systems were reviewed with the patient and are negative.   VITALS:   Today's Vitals   04/11/24 1344  BP: 124/62  Pulse: 81  Resp: 17  Temp: 98.2 F (36.8 C)  TempSrc: Temporal  SpO2: 95%  Weight: 193 lb 4.8 oz (87.7 kg)  Height: 6' 2 (1.88 [name])   Body mass index is 24.82 kg/[name].   Wt Readings from Last 3 Encounters:  04/23/24 188 lb 9.6 oz (85.5 kg)  04/17/24 186 lb (84.4 kg)  04/12/24 196 lb (88.9 kg)    Body mass index is 24.82 kg/[name].  Performance status (ECOG): 1 - Symptomatic but completely ambulatory  PHYSICAL EXAM:   GENERAL:alert.  He is in no acute distress.  Appears uncomfortable.  Difficult time sitting for extended periods of time due to low back and right hip pain. SKIN: skin color, texture, turgor are normal, no rashes or significant lesions EYES: normal, Conjunctiva are pink and non-injected, sclera clear OROPHARYNX:no exudate, no erythema and lips, buccal mucosa, and tongue normal  NECK: supple, thyroid  normal size, non-tender, without nodularity LYMPH:  no palpable lymphadenopathy in the cervical, axillary or inguinal LUNGS: clear to auscultation and percussion with normal breathing effort HEART: regular rate & rhythm and no murmurs and no lower extremity edema ABDOMEN:abdomen soft, non-tender and normal bowel sounds Musculoskeletal:no cyanosis of digits and no clubbing.  Using walker to help with ambulation. NEURO: alert & oriented x 3 with fluent speech, no focal motor/sensory deficits  LABORATORY DATA:  I have reviewed the data as listed    Component Value Date/Time   NA 137 04/23/2024  1523   NA 138 10/21/2019 1138   K 4.3 04/23/2024 1523   CL 102 04/23/2024 1523   CO2 25 04/23/2024 1523   GLUCOSE 282 (H) 04/23/2024 1523   BUN 24 04/23/2024 1523   BUN 14 10/21/2019 1138   CREATININE 1.26 04/23/2024 1523   CALCIUM  9.0 04/23/2024 1523   PROT 6.3 04/23/2024 1523   ALBUMIN 3.7 04/11/2024 1302   AST 19 04/23/2024 1523   AST 11 (L) 04/11/2024 1302   ALT 14 04/23/2024 1523   ALT 17 04/11/2024 1302   ALKPHOS 84 04/11/2024 1302   BILITOT 0.4 04/23/2024 1523   BILITOT 0.5 04/11/2024 1302   GFRNONAA 57 (L) 04/11/2024 1302   GFRAA 101 10/21/2019 1138     Lab Results  Component  Value Date   WBC 5.9 04/23/2024   NEUTROABS 3,924 04/23/2024   HGB 11.0 (L) 04/23/2024   HCT 35.4 (L) 04/23/2024   MCV 86.3 04/23/2024   PLT 186 04/23/2024      RADIOGRAPHIC STUDIES: DG INJECT DIAG/THERA/INC NEEDLE/CATH/PLC EPI/LUMB/SAC W/IMG Result Date: 04/23/2024 CLINICAL DATA:  Lumbosacral spondylosis without myelopathy. Lumbar radiculopathy. Low back pain and right leg pain including in the anterior thigh and knee. Disc herniation at L3-4 with right lateral recess stenosis. FLUOROSCOPY: Radiation Exposure Index (as provided by the fluoroscopic device): 0.60 mGy Kerma PROCEDURE: The procedure, risks, benefits, and alternatives were explained to the patient. Questions regarding the procedure were encouraged and answered. The patient understands and consents to the procedure. LUMBAR EPIDURAL INJECTION: An interlaminar approach was performed on the right at L3-4. The overlying skin was cleansed and anesthetized. A 3.5 inch 20 gauge epidural needle was advanced using loss-of-resistance technique. DIAGNOSTIC EPIDURAL INJECTION Injection of Isovue -[name] 200 shows a good epidural pattern with spread above and below the level of needle placement primarily on the right. No vascular opacification is seen. THERAPEUTIC EPIDURAL INJECTION: 80 mg of Depo-Medrol  mixed with 2 mL of 1% lidocaine  were instilled. The  procedure was well-tolerated, and the patient was discharged thirty minutes following the injection in good condition. COMPLICATIONS: None immediate IMPRESSION: Technically successful interlaminar epidural injection on the right at L3-4. Electronically Signed   By: Dasie Hamburg [name].D.   On: 04/23/2024 15:06   ECHO TEE Result Date: 04/02/2024    TRANSESOPHOGEAL ECHO REPORT   Patient Name:   Robert [name] Date of Exam: 04/02/2024 Medical Rec #:  [id number]             Height:       75.0 in Accession #:    [id number]            Weight:       200.0 lb Date of Birth:  Jul 18, 1951            BSA:          2.194 [name] Patient Age:    72 years              BP:           145/79 mmHg Patient Gender: [name]                     HR:           65 bpm. Exam Location:  Inpatient Procedure: 3D Echo, Transesophageal Echo, Cardiac Doppler and Color Doppler            (Both Spectral and Color Flow Doppler were utilized during            procedure). Indications:     Endocarditis  History:         Patient has prior history of Echocardiogram examinations, most                  recent 03/29/2024. Risk Factors:Dyslipidemia, Current Smoker,                  Hypertension and Diabetes.  Sonographer:     Koleen Popper RDCS Referring Phys:  [id number] ZANE ADAMS Diagnosing Phys: Maude Emmer MD PROCEDURE: After discussion of the risks and benefits of a TEE, an informed consent was obtained. The transesophogeal probe was passed without difficulty through the esophogus of the patient. Imaged were obtained with the patient in a left lateral decubitus position. Sedation performed  by different physician. The patient was monitored while under deep sedation. Anesthestetic sedation was provided intravenously by Anesthesiology: 300mg  of Propofol , 20mg  of Lidocaine . The patient developed no complications during the procedure.  IMPRESSIONS  1. No evidence of SBE or vegetations.  2. Left ventricular ejection fraction, by estimation, is 60 to 65%. The left  ventricle has normal function. There is mild left ventricular hypertrophy.  3. Right ventricular systolic function is normal. The right ventricular size is normal.  4. Left atrial size was mildly dilated. No left atrial/left atrial appendage thrombus was detected.  5. The mitral valve is abnormal. Mild to moderate mitral valve regurgitation. No evidence of mitral stenosis. Moderate mitral annular calcification.  6. The aortic valve is tricuspid. There is moderate calcification of the aortic valve. There is moderate thickening of the aortic valve. Aortic valve regurgitation is mild. Aortic valve sclerosis/calcification is present, without any evidence of aortic stenosis.  7. 3D performed of the mitral valve and 3D performed of the aortic valve and demonstrates AV and MV visualized in 3D. FINDINGS  Left Ventricle: Left ventricular ejection fraction, by estimation, is 60 to 65%. The left ventricle has normal function. The left ventricular internal cavity size was normal in size. There is mild left ventricular hypertrophy. Right Ventricle: The right ventricular size is normal. No increase in right ventricular wall thickness. Right ventricular systolic function is normal. Left Atrium: Left atrial size was mildly dilated. No left atrial/left atrial appendage thrombus was detected. Right Atrium: Right atrial size was normal in size. Pericardium: There is no evidence of pericardial effusion. Mitral Valve: The mitral valve is abnormal. There is mild thickening of the mitral valve leaflet(s). There is mild calcification of the mitral valve leaflet(s). Moderate mitral annular calcification. Mild to moderate mitral valve regurgitation. No evidence of mitral valve stenosis. There is no evidence of mitral valve vegetation. Tricuspid Valve: The tricuspid valve is normal in structure. Tricuspid valve regurgitation is mild. Aortic Valve: The aortic valve is tricuspid. There is moderate calcification of the aortic valve. There is  moderate thickening of the aortic valve. Aortic valve regurgitation is mild. Aortic valve sclerosis/calcification is present, without any evidence of aortic stenosis. Aortic valve mean gradient measures 6.0 mmHg. Aortic valve peak gradient measures 11.0 mmHg. Aortic valve area, by VTI measures 2.22 cm. Pulmonic Valve: The pulmonic valve was normal in structure. Pulmonic valve regurgitation is trivial. Aorta: The aortic root is normal in size and structure. IAS/Shunts: The interatrial septum appears to be lipomatous. No atrial level shunt detected by color flow Doppler. Additional Comments: No evidence of SBE or vegetations. 3D was performed not requiring image post processing on an independent workstation and was abnormal. Spectral Doppler performed. LEFT VENTRICLE PLAX 2D LVOT diam:     2.43 cm LV SV:         78 LV SV Index:   36 LVOT Area:     4.65 cm  AORTIC VALVE AV Area (Vmax):    2.58 cm AV Area (Vmean):   2.15 cm AV Area (VTI):     2.22 cm AV Vmax:           166.00 cm/s AV Vmean:          116.000 cm/s AV VTI:            0.353 [name] AV Peak Grad:      11.0 mmHg AV Mean Grad:      6.0 mmHg LVOT Vmax:         92.20  cm/s LVOT Vmean:        53.600 cm/s LVOT VTI:          0.168 [name] LVOT/AV VTI ratio: 0.48  AORTA Ao Root diam: 4.40 cm Ao Asc diam:  3.80 cm  SHUNTS Systemic VTI:  0.17 [name] Systemic Diam: 2.43 cm Maude Emmer MD Electronically signed by Maude Emmer MD Signature Date/Time: 04/02/2024/11:33:25 AM    Final    EP STUDY Result Date: 04/02/2024 See surgical note for result.  ECHOCARDIOGRAM COMPLETE Result Date: 03/29/2024    ECHOCARDIOGRAM REPORT   Patient Name:   Robert [name] Date of Exam: 03/29/2024 Medical Rec #:  [id number]             Height:       75.0 in Accession #:    [id number]            Weight:       200.0 lb Date of Birth:  [birth year]            BSA:          2.194 [name] Patient Age:    72 years              BP:           118/58 mmHg Patient Gender: [name]                     HR:           75  bpm. Exam Location:  Inpatient Procedure: 2D Echo, Cardiac Doppler and Color Doppler (Both Spectral and Color            Flow Doppler were utilized during procedure). Indications:    Endocarditis I38  History:        Patient has prior history of Echocardiogram examinations, most                 recent 11/03/2023.  Sonographer:    Tinnie Gosling RDCS Referring Phys: [id number] [facility] [place] IMPRESSIONS  1. Overall poor quality study for vegetation assessment.  2. Left ventricular ejection fraction, by estimation, is 65 to 70%. The left ventricle has normal function. The left ventricle has no regional wall motion abnormalities. There is mild left ventricular hypertrophy. Left ventricular diastolic parameters are indeterminate.  3. Right ventricular systolic function is normal. The right ventricular size is normal.  4. Left atrial size was mildly dilated.  5. 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 The mitral valve is abnormal. Trivial mitral valve regurgitation. No evidence of mitral stenosis. Severe mitral annular calcification.  6. The aortic valve was not well visualized. There is moderate calcification of the aortic valve. There is moderate thickening of the aortic valve. Aortic valve regurgitation is trivial. Aortic valve sclerosis is present, with no evidence of aortic valve stenosis.  7. Aortic dilatation noted. There is mild dilatation of the aortic root, measuring 39 mm.  8. The inferior vena cava is dilated in size with >50% respiratory variability, suggesting right atrial pressure of 8 mmHg. FINDINGS  Left Ventricle: Left ventricular ejection fraction, by estimation, is 65 to 70%. The left ventricle has normal function. The left ventricle has no regional wall motion abnormalities. Strain was performed and the global longitudinal strain is indeterminate. The left ventricular internal cavity size was normal in size. There is mild left ventricular hypertrophy. Left ventricular  diastolic parameters are indeterminate. Right Ventricle: The right ventricular size is normal. No increase in  right ventricular wall thickness. Right ventricular systolic function is normal. Left Atrium: Left atrial size was mildly dilated. Right Atrium: Right atrial size was normal in size. Pericardium: There is no evidence of pericardial effusion. 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 The mitral valve is abnormal. There is mild thickening of the mitral valve leaflet(s). There is mild calcification of the mitral valve leaflet(s). Severe mitral annular calcification. Trivial mitral valve regurgitation. No evidence of mitral valve stenosis. Tricuspid Valve: The tricuspid valve is normal in structure. Tricuspid valve regurgitation is trivial. No evidence of tricuspid stenosis. Aortic Valve: The aortic valve was not well visualized. There is moderate calcification of the aortic valve. There is moderate thickening of the aortic valve. Aortic valve regurgitation is trivial. Aortic valve sclerosis is present, with no evidence of aortic valve stenosis. Pulmonic Valve: The pulmonic valve was not well visualized. Pulmonic valve regurgitation is not visualized. No evidence of pulmonic stenosis. Aorta: Aortic dilatation noted. There is mild dilatation of the aortic root, measuring 39 mm. Venous: The inferior vena cava is dilated in size with greater than 50% respiratory variability, suggesting right atrial pressure of 8 mmHg. IAS/Shunts: No atrial level shunt detected by color flow Doppler. Additional Comments: Overall poor quality study for vegetation assessment. 3D was performed not requiring image post processing on an independent workstation and was indeterminate.  LEFT VENTRICLE PLAX 2D LVIDd:         5.60 cm   Diastology LVIDs:         3.90 cm   LV e' medial:    5.44 cm/s LV PW:         1.20 cm   LV E/e' medial:  27.6 LV IVS:        1.20 cm   LV e' lateral:   8.27 cm/s  LVOT diam:     2.30 cm   LV E/e' lateral: 18.1 LV SV:         96 LV SV Index:   44 LVOT Area:     4.15 cm  RIGHT VENTRICLE             IVC RV S prime:     15.30 cm/s  IVC diam: 2.60 cm TAPSE ([name]-mode): 2.7 cm LEFT ATRIUM            Index LA diam:      4.10 cm  1.87 cm/[name] LA Vol (A2C): 78.1 ml  35.59 ml/[name] LA Vol (A4C): 104.0 ml 47.39 ml/[name]  AORTIC VALVE LVOT Vmax:   117.00 cm/s LVOT Vmean:  75.000 cm/s LVOT VTI:    0.231 [name]  AORTA Ao Root diam: 3.90 cm Ao Asc diam:  3.50 cm MITRAL VALVE MV Area (PHT): 3.08 cm     SHUNTS MV Decel Time: 246 msec     Systemic VTI:  0.23 [name] MV E velocity: 150.00 cm/s  Systemic Diam: 2.30 cm MV A velocity: 107.00 cm/s MV E/A ratio:  1.40 Maude Emmer MD Electronically signed by Maude Emmer MD Signature Date/Time: 03/29/2024/4:30:20 PM    Final    IR REMOVAL TUN ACCESS W/ PORT W/O FL MOD SED Result Date: 03/29/2024 INDICATION: 73 year old with bacteremia.  Request for Port-A-Cath removal. EXAM: REMOVAL OF RIGHT CHEST PORT-A-CATH MEDICATIONS: 1% lidocaine  for local anesthetic ANESTHESIA/SEDATION: None FLUOROSCOPY: None COMPLICATIONS: None immediate. PROCEDURE: Informed written consent was obtained from the patient after a thorough discussion of the procedural risks, benefits and alternatives. All questions were addressed. Maximal Sterile Barrier Technique  was utilized including caps, mask, sterile gowns, sterile gloves, sterile drape, hand hygiene and skin antiseptic. A timeout was performed prior to the initiation of the procedure. The right chest was prepped and draped in a sterile fashion. 1% lidocaine  was utilized for local anesthesia. An incision was made over the previously healed surgical incision. Utilizing blunt dissection, the port catheter and reservoir were removed from the underlying subcutaneous tissue in their entirety. The pocket was irrigated with a copious amount of sterile normal saline. The subcutaneous tissue was closed with 3-0 Vicryl interrupted subcutaneous  stitches. A 4-0 Vicryl running subcuticular stitch was utilized to approximate the skin. Dermabond was applied. FINDINGS: Port skin site looked normal and healthy. Port pocket was examined after the port was removed. Port pocket looked healthy. Port pocket was closed without complication. IMPRESSION: Successful removal of right chest Port-A-Cath. Electronically Signed   By: Juliene Balder [name].D.   On: 03/29/2024 16:16

## 2024-04-11 ENCOUNTER — Inpatient Hospital Stay

## 2024-04-11 ENCOUNTER — Inpatient Hospital Stay: Attending: Hematology

## 2024-04-11 ENCOUNTER — Inpatient Hospital Stay: Admitting: Nurse Practitioner

## 2024-04-11 VITALS — BP 124/62 | HR 81 | Temp 98.2°F | Resp 17 | Ht 74.0 in | Wt 193.3 lb

## 2024-04-11 DIAGNOSIS — C675 Malignant neoplasm of bladder neck: Secondary | ICD-10-CM | POA: Insufficient documentation

## 2024-04-11 DIAGNOSIS — Z5112 Encounter for antineoplastic immunotherapy: Secondary | ICD-10-CM | POA: Diagnosis not present

## 2024-04-11 DIAGNOSIS — C679 Malignant neoplasm of bladder, unspecified: Secondary | ICD-10-CM

## 2024-04-11 DIAGNOSIS — Z79620 Long term (current) use of immunosuppressive biologic: Secondary | ICD-10-CM | POA: Diagnosis not present

## 2024-04-11 LAB — CBC WITH DIFFERENTIAL (CANCER CENTER ONLY)
Abs Immature Granulocytes: 0.29 10*3/uL — ABNORMAL HIGH (ref 0.00–0.07)
Basophils Absolute: 0 10*3/uL (ref 0.0–0.1)
Basophils Relative: 0 %
Eosinophils Absolute: 0 10*3/uL (ref 0.0–0.5)
Eosinophils Relative: 0 %
HCT: 32 % — ABNORMAL LOW (ref 39.0–52.0)
Hemoglobin: 10.9 g/dL — ABNORMAL LOW (ref 13.0–17.0)
Immature Granulocytes: 2 %
Lymphocytes Relative: 18 %
Lymphs Abs: 2.9 10*3/uL (ref 0.7–4.0)
MCH: 27.7 pg (ref 26.0–34.0)
MCHC: 34.1 g/dL (ref 30.0–36.0)
MCV: 81.2 fL (ref 80.0–100.0)
Monocytes Absolute: 0.8 10*3/uL (ref 0.1–1.0)
Monocytes Relative: 5 %
Neutro Abs: 12.2 10*3/uL — ABNORMAL HIGH (ref 1.7–7.7)
Neutrophils Relative %: 75 %
Platelet Count: 232 10*3/uL (ref 150–400)
RBC: 3.94 MIL/uL — ABNORMAL LOW (ref 4.22–5.81)
RDW: 15.7 % — ABNORMAL HIGH (ref 11.5–15.5)
WBC Count: 16.2 10*3/uL — ABNORMAL HIGH (ref 4.0–10.5)
nRBC: 0 % (ref 0.0–0.2)

## 2024-04-11 LAB — CMP (CANCER CENTER ONLY)
ALT: 17 U/L (ref 0–44)
AST: 11 U/L — ABNORMAL LOW (ref 15–41)
Albumin: 3.7 g/dL (ref 3.5–5.0)
Alkaline Phosphatase: 84 U/L (ref 38–126)
Anion gap: 7 (ref 5–15)
BUN: 27 mg/dL — ABNORMAL HIGH (ref 8–23)
CO2: 27 mmol/L (ref 22–32)
Calcium: 9.1 mg/dL (ref 8.9–10.3)
Chloride: 95 mmol/L — ABNORMAL LOW (ref 98–111)
Creatinine: 1.31 mg/dL — ABNORMAL HIGH (ref 0.61–1.24)
GFR, Estimated: 57 mL/min — ABNORMAL LOW (ref >60)
Glucose, Bld: 295 mg/dL — ABNORMAL HIGH (ref 70–99)
Potassium: 4.4 mmol/L (ref 3.5–5.1)
Sodium: 129 mmol/L — ABNORMAL LOW (ref 135–145)
Total Bilirubin: 0.5 mg/dL (ref 0.0–1.2)
Total Protein: 7 g/dL (ref 6.5–8.1)

## 2024-04-11 LAB — MAGNESIUM: Magnesium: 1.6 mg/dL — ABNORMAL LOW (ref 1.7–2.4)

## 2024-04-11 LAB — TSH: TSH: 1.14 u[IU]/mL (ref 0.350–4.500)

## 2024-04-11 MED ORDER — NIVOLUMAB-HYALURONIDASE-NVHY 600-10000 MG-UT/5ML ~~LOC~~ SOLN
1200.0000 mg | Freq: Once | SUBCUTANEOUS | Status: AC
  Administered 2024-04-11: 1200 mg via SUBCUTANEOUS
  Filled 2024-04-11: qty 10

## 2024-04-11 NOTE — Patient Instructions (Signed)
 [facility] - A DEPT OF [facility]  Discharge Instructions: Thank you for choosing [facility] to provide your oncology and hematology care.   If you have a lab appointment with the Cancer Center, please go directly to the Cancer Center and check in at the registration area.   Wear comfortable clothing and clothing appropriate for easy access to any Portacath or PICC line.   We strive to give you quality time with your provider. You may need to reschedule your appointment if you arrive late (15 or more minutes).  Arriving late affects you and other patients whose appointments are after yours.  Also, if you miss three or more appointments without notifying the office, you may be dismissed from the clinic at the provider's discretion.      For prescription refill requests, have your pharmacy contact our office and allow 72 hours for refills to be completed.    Today you received the following chemotherapy and/or immunotherapy agents: nivolumab -Hyaluronidase-nvhy (OPDIVO  QVANTIG)     To help prevent nausea and vomiting after your treatment, we encourage you to take your nausea medication as directed.  BELOW ARE SYMPTOMS THAT SHOULD BE REPORTED IMMEDIATELY: *FEVER GREATER THAN 100.4 F (38 C) OR HIGHER *CHILLS OR SWEATING *NAUSEA AND VOMITING THAT IS NOT CONTROLLED WITH YOUR NAUSEA MEDICATION *UNUSUAL SHORTNESS OF BREATH *UNUSUAL BRUISING OR BLEEDING *URINARY PROBLEMS (pain or burning when urinating, or frequent urination) *BOWEL PROBLEMS (unusual diarrhea, constipation, pain near the anus) TENDERNESS IN MOUTH AND THROAT WITH OR WITHOUT PRESENCE OF ULCERS (sore throat, sores in mouth, or a toothache) UNUSUAL RASH, SWELLING OR PAIN  UNUSUAL VAGINAL DISCHARGE OR ITCHING   Items with * indicate a potential emergency and should be followed up as soon as possible or go to the Emergency Department if any problems should occur.  Please show the  CHEMOTHERAPY ALERT CARD or IMMUNOTHERAPY ALERT CARD at check-in to the Emergency Department and triage nurse.  Should you have questions after your visit or need to cancel or reschedule your appointment, please contact [facility] - A DEPT OF [facility]  Dept: [phone]  and follow the prompts.  Office hours are 8:00 a.m. to 4:30 p.m. Monday - Friday. Please note that voicemails left after 4:00 p.m. may not be returned until the following business day.  We are closed weekends and major holidays. You have access to a nurse at all times for urgent questions. Please call the main number to the clinic Dept: [phone] and follow the prompts.   For any non-urgent questions, you may also contact your provider using MyChart. We now offer e-Visits for anyone 34 and older to request care online for non-urgent symptoms. For details visit [email or web address].   Also download the MyChart app! Go to the app store, search MyChart, open the app, select [facility], and log in with your MyChart username and password.

## 2024-04-11 NOTE — Progress Notes (Signed)
 Pt had port removed. Lab appt made for him. Mariah in scheduling notified to change his port appts to lab appts.

## 2024-04-12 ENCOUNTER — Telehealth: Payer: Self-pay

## 2024-04-12 ENCOUNTER — Other Ambulatory Visit: Payer: Self-pay | Admitting: Family Medicine

## 2024-04-12 ENCOUNTER — Telehealth: Payer: Self-pay | Admitting: Internal Medicine

## 2024-04-12 ENCOUNTER — Other Ambulatory Visit: Payer: Self-pay | Admitting: *Deleted

## 2024-04-12 LAB — T4: T4, Total: 8.7 ug/dL (ref 4.5–12.0)

## 2024-04-12 MED ORDER — OXYCODONE HCL 5 MG PO TABS: 5.0000 mg | ORAL_TABLET | Freq: Four times a day (QID) | ORAL | 0 refills | Status: DC | PRN

## 2024-04-12 NOTE — Telephone Encounter (Unsigned)
 Copied from CRM [id number]. Topic: Clinical - Prescription Issue >> Apr 12, 2024 11:03 AM Kita Perish H wrote: Reason for CRM: Patient calling to check status of refill request for his oxyCODONE  (OXY IR/ROXICODONE ) 5 MG immediate release tablet shows pending as of 6/17, request was denied due to Request refused: Prescriber not at this practice states the original provider was a doctor from a hospital stay and the primary care should be able to fill medication. Patient has a hospital follow up scheduled on 6/25, using pharmacy.  Lovena Rubinstein [phone]

## 2024-04-12 NOTE — Telephone Encounter (Signed)
 Pharmacy please advise on holding Apixaban  (Eliquis ) 4 DOSES BEFORE PROCEDURE  prior to Lumbar EPI scheduled for TBD (URGENT). Thank you.

## 2024-04-12 NOTE — Patient Instructions (Signed)
 Visit Information  Thank you for taking time to visit with me today. Please don't hesitate to contact me if I can be of assistance to you before our next scheduled telephone appointment.  Our next appointment is by telephone on Tuesday 04/16/24 at 1:00 pm  Please call the care guide team at [phone] if you need to cancel or reschedule your appointment.   Following are the goals we discussed today:  Patient Self Care Activities:  Attend all scheduled provider appointments Call pharmacy for medication refills 3-7 days in advance of running out of medications Call provider office for new concerns or questions  Participate in Transition of Care Program/Attend TOC scheduled calls Take medications as prescribed   Continue pacing activity to avoid and minimize pain  Continue to use assistive devices as needed to prevent falls- your walker If you believe your condition is getting worse- contact your care providers (doctors) promptly- reaching out to your doctor early when you have concerns can prevent you from having to go to the hospital Continue to monitor your blood pressures, weights, oxygen levels, and blood sugars at home and write them down on paper  If you are experiencing a Mental Health or Behavioral Health Crisis or need someone to talk to, please  call the Suicide and Crisis Lifeline: 988 call the [place]  National Suicide Prevention Lifeline: 314 755 6983 or TTY: 304-761-5635 TTY 5411417429) to talk to a trained counselor call 1-800-273-TALK (toll free, 24 hour hotline) go to [facility] [address] [place] [phone]) call the [facility] Crisis Line: [phone] call 911   Patient verbalizes understanding of instructions and care plan provided today and agrees to view in MyChart. Active MyChart status and patient understanding of how to access instructions and care plan via MyChart confirmed with patient.      Gwynne Kemnitz  [name] Paarth Cropper, RN, BSN, Media planner  Transitions of Care  [facility] - [facility] [phone]: direct office

## 2024-04-12 NOTE — Telephone Encounter (Signed)
   Pre-operative Risk Assessment    Patient Name: Robert [name]  DOB: [birth year] MRN: [id number]   Date of last office visit: 10/03/23 Ahmad Alert, MD Date of next office visit: NONE   Request for Surgical Clearance    Procedure:  LUMBAR EPI  Date of Surgery:  Clearance TBD        (URGENT)                        Surgeon:  NOT INDICATED Surgeon's Group or Practice Name:  [facility] Phone number:  [phone] Fax number:  [phone]   Type of Clearance Requested:   - Medical  - Pharmacy:  Hold Apixaban  (Eliquis ) 4 DOSES BEFORE PROCEDURE   Type of Anesthesia:  Not Indicated   Additional requests/questions:    Signed, Collin Deal   04/12/2024, 11:50 AM

## 2024-04-12 NOTE — Transitions of Care (Post Inpatient/ED Visit) (Signed)
 Transition of Care week 2/ day # 8  Visit Note  04/12/2024  Name: Robert [name] MRN: [id number]          DOB: 1951-08-15  Situation: Patient enrolled in [facility] 30-day program. Visit completed with patient by telephone.   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: spouse has 24/7 care per patient report: caregivers also assist with his care as indicated, including providing transportation to his provider appointments [facility]BCI LCSW team active/ established  Initial Transition Care Management Follow-up Telephone Call    Past Medical History:  Diagnosis Date   Anticoagulant long-term use    eliquis --- managed by cardiology   Arthritis    Bladder cancer [facility])    urologist--- dr Derrick Fling;  overlapping   Cataract    History of basal cell carcinoma (BCC) excision    History of COVID-19 05/2019   per pt mild symptoms that resolved   HLD (hyperlipidemia)    HTN (hypertension)    Hx of colonic polyps    Insulin  dependent type 2 diabetes mellitus [facility])    endocrinologist--- dr Aldona Amel --  (03-11-2022 per pt check blood sugar 1-2 times daily,  fasting sugar-- 118-120s)   Nocturia    Paroxysmal atrial fibrillation (HCC) 03/12/2019   cardiologist--- dr Floria Hurst   Renal disorder    Ureterocele    Use of cane as ambulatory aid    Assessment:  I am doing okay I guess, as long as I move really slow- the pain is just pretty bad whenever I try to move so I am laying around a lot more than I would like to; using the  walker and lying down a lot; I have called Dr. Tami Falcon office again today because I need pain medicine; I hope he gets back to me soon; other than trying to manage this back pain, things are okay; my ankles are a little swollen and I am taking this new fluid pill he told me to take   Denies clinical concerns and sounds to be in no distress throughout TOC 30-day program outreach call today  Patient Reported Symptoms: Cognitive Cognitive Status: Alert and oriented to person, place, and time, Insightful and able to interpret abstract concepts, Normal speech and language skills Cognitive/Intellectual Conditions Management [RPT]: None reported or documented in medical history or problem list      Neurological Neurological Review of Symptoms: No symptoms reported Neurological Management Strategies: Coping strategies  HEENT HEENT Symptoms Reported: No symptoms reported      Cardiovascular Cardiovascular Symptoms Reported: Swelling in legs or feet, Other: Other Cardiovascular Symptoms: Reports I started having some swelling in my feet after all of this happended with my back; Dr. Georgia Kipper ordered a diuretic and I am taking it; not having any problems with my breathing and my vital signs all look good; am checking my weights periodically- yesterday I weighed 196 lbs Does patient have uncontrolled Hypertension?: No Cardiovascular Conditions: Hypertension Cardiovascular Management Strategies: Medication therapy, Routine screening, Coping strategies Weight: 196 lb (88.9 kg)  Respiratory Respiratory Symptoms Reported: No symptoms reported Other Respiratory Symptoms: Denies shortness of  breath/ cough; sounds to be in no respiratory distress throughout [facility] call Additional Respiratory Details: Reports checking SaO2 levels at home: reports was 98% this morning    Endocrine Patient reports the following symptoms related to hypoglycemia or hyperglycemia : No symptoms reported Is patient diabetic?:  Yes Is patient checking blood sugars at home?: Yes (Occasionally;) Endocrine Conditions: Diabetes Endocrine Management Strategies: Medication therapy, Coping strategies, Diet modification, Routine screening  Gastrointestinal Gastrointestinal Symptoms Reported: No symptoms reported Additional Gastrointestinal Details: Reports having normal and regular bowle movements Gastrointestinal Management Strategies: Coping strategies    Genitourinary Genitourinary Symptoms Reported: Other Other Genitourinary Symptoms: Recent UTI: reports my urine looks fine, it is clear and yellow; I am still managing the urostomy on my own- no problems there, site looks fine and it is draining fine Genitourinary Conditions: Bladder cancer, Chronic kidney disease Genitourinary Management Strategies: Coping strategies, Urostomy, Adequate rest  Integumentary Integumentary Symptoms Reported: No symptoms reported Additional Integumentary Details: Confirms site where chemo port was removed at time of recent hospitalization looks fine; reviewed recent oncology provider office visit 04/11/24: reports good understanding of same; confirms now receiving subcutaneous chemotherapy; patient reports cancer doctor gave me a good report and had no concerns around my condition    Musculoskeletal Musculoskelatal Symptoms Reviewed: Muscle pain, Weakness Additional Musculoskeletal Details: confirmed currently requiring/ using assistive devices for ambulation - walker post recent hospital admission for lumbar radiculopathy- confirms awaiting scheduling of lumbar epidural injection Musculoskeletal Conditions: Joint pain, Mobility limited, Back pain, Other Other Musculoskeletal Conditions: recent hospital admission for lumbar radiculopathy Musculoskeletal Management Strategies: Medication therapy, Routine screening, Coping strategies, Medical device      Psychosocial Psychosocial Symptoms Reported: No symptoms reported          Vitals:   04/12/24 1255  SpO2: 98%    Medications Reviewed Today     Reviewed by Lititia Sen M, RN (Registered Nurse) on 04/12/24 at 1256  Med List Status: <None>   Medication Order Taking? Sig Documenting Provider Last Dose Status Informant  amoxicillin -clavulanate (AUGMENTIN) 875-125 MG tablet [id number]  Take 1 tablet by mouth 2 (two) times daily. [name], DO  Active   apixaban  (ELIQUIS ) 5 MG TABS tablet [id number]  Take 1 tablet (5 mg total) by mouth 2 (two) times daily. Nahser, Lela Purple, MD  Active Self  atorvastatin  (LIPITOR) 40 MG tablet [id number]  Take 1 tablet (40 mg total) by mouth at bedtime. Audria Leather, MD  Active Self  B Complex-C (B-COMPLEX WITH VITAMIN C) tablet [id number]  Take 1 tablet by mouth 3 (three) times a week. [provider]  Active Self           Med Note Mliss Anderson GEXBMWUXL, SUSAN A   Sun Feb 04, 2024  4:51 PM)    Cholecalciferol (VITAMIN D-3 PO) [id number]  Take 1 tablet by mouth 3 (three) times a week. [provider]  Active Self           Med Note Nolan Battle, [facility]   Wed Mar 27, 2024 10:04 PM)    EPINEPHrine  0.3 mg/0.3 mL IJ SOAJ injection [id number]  Inject 0.3 mg into the muscle as needed for anaphylaxis. [name], PA-C  Active Self  furosemide (LASIX) 20 MG tablet [id number]  Take 1-2 tablets (20-40 mg total) by mouth daily as needed. Plotnikov, Aleksei V, MD  Active   glucose blood test strip [id number]  1 each by Other route daily. Use daily for glucose control - Dx 250.00 [name], [name],  MD  Active Self  lidocaine  (LIDODERM ) 5 % [id number]  Place 1 patch onto the skin daily. Remove & Discard patch within 12 hours or as directed by MD [name], DO  Active   methocarbamol  (ROBAXIN ) 500 MG tablet [id number]  Take 1 tablet (500 mg total) by mouth every 8 (eight) hours as needed for muscle spasms. [name], DO  Active   metoprolol  succinate (TOPROL -XL) 25 MG 24 hr tablet [id number]  Take 1  tablet (25 mg total) by mouth daily. Nahser, Lela Purple, MD  Active Self  oxyCODONE  (OXY IR/ROXICODONE ) 5 MG immediate release tablet [id number]  Take 1 tablet (5 mg total) by mouth every 6 (six) hours as needed for moderate pain (pain score 4-6). [name], DO  Active   pregabalin  (LYRICA ) 75 MG capsule [id number]  Take 1 capsule (75 mg total) by mouth daily. [name], DO  Active   propranolol  (INDERAL ) 10 MG tablet [id number]  TAKE 1 TABLET BY MOUTH 4 TIMES DAILY AS NEEDED FOR PALPITATIONS, IRREGULAR OR FAST HEART RATE Nahser, Lela Purple, MD  Active Self           Med Note Guido Leeks, [name]   Fri Mar 01, 2024  7:02 PM) I was unable to track any last fill of this. The patient stated he takes this intermittently.  Semaglutide , 1 MG/DOSE, (OZEMPIC , 1 MG/DOSE,) 4 MG/3ML SOPN [id number]  INJECT 1 MG UNDER THE SKIN ONCE A WEEK AS DIRECTED Emilie Harden, MD  Active Self           Med Note Guido Leeks, [name]   Fri Mar 01, 2024  5:59 PM)    sodium chloride  flush (NS) 0.9 % injection 10 mL [id number]   [name] [place], MD  Active            Recommendation:   PCP Follow-up- as scheduled 04/17/24 Specialty provider follow-up: ID provider: as scheduled 04/23/24 Continue Current Plan of Care  Follow Up Plan:   Telephone follow-up in 1 week- as scheduled 04/16/24  Plan for next week's call: Review medication adherence/ concerns: follow up on pain medication needs as per 04/12/24 outreach Review upcoming provider office visits as above Review blood pressures, SaO2, weights, blood sugars at home- assess ankle swelling/ use of diuretics Reinforce/ provide education re: signs/ symptoms UTI along with action plan: urostomy management at home  Pls call/ message for questions,  Erlene Hawks, RN, BSN, CCRN Alumnus RN Care Manager  Transitions of Care  [facility] - [facility] [phone]: direct office

## 2024-04-12 NOTE — Telephone Encounter (Signed)
 med rec and consent done      Patient Consent for Virtual Visit        Robert [name] has provided verbal consent on 04/12/2024 for a virtual visit (video or telephone).   CONSENT FOR VIRTUAL VISIT FOR:  Robert [name]  By participating in this virtual visit I agree to the following:  I hereby voluntarily request, consent and authorize [facility] and its employed or contracted physicians, physician assistants, nurse practitioners or other licensed health care professionals (the Practitioner), to provide me with telemedicine health care services (the "Services) as deemed necessary by the treating Practitioner. I acknowledge and consent to receive the Services by the Practitioner via telemedicine. I understand that the telemedicine visit will involve communicating with the Practitioner through live audiovisual communication technology and the disclosure of certain medical information by electronic transmission. I acknowledge that I have been given the opportunity to request an in-person assessment or other available alternative prior to the telemedicine visit and am voluntarily participating in the telemedicine visit.  I understand that I have the right to withhold or withdraw my consent to the use of telemedicine in the course of my care at any time, without affecting my right to future care or treatment, and that the Practitioner or I may terminate the telemedicine visit at any time. I understand that I have the right to inspect all information obtained and/or recorded in the course of the telemedicine visit and may receive copies of available information for a reasonable fee.  I understand that some of the potential risks of receiving the Services via telemedicine include:  Delay or interruption in medical evaluation due to technological equipment failure or disruption; Information transmitted may not be sufficient (e.g. poor resolution of images) to allow for appropriate  medical decision making by the Practitioner; and/or  In rare instances, security protocols could fail, causing a breach of personal health information.  Furthermore, I acknowledge that it is my responsibility to provide information about my medical history, conditions and care that is complete and accurate to the best of my ability. I acknowledge that Practitioner's advice, recommendations, and/or decision may be based on factors not within their control, such as incomplete or inaccurate data provided by me or distortions of diagnostic images or specimens that may result from electronic transmissions. I understand that the practice of medicine is not an exact science and that Practitioner makes no warranties or guarantees regarding treatment outcomes. I acknowledge that a copy of this consent can be made available to me via my patient portal [facility] MyChart), or I can request a printed copy by calling the office of [facility].    I understand that my insurance will be billed for this visit.   I have read or had this consent read to me. I understand the contents of this consent, which adequately explains the benefits and risks of the Services being provided via telemedicine.  I have been provided ample opportunity to ask questions regarding this consent and the Services and have had my questions answered to my satisfaction. I give my informed consent for the services to be provided through the use of telemedicine in my medical care

## 2024-04-12 NOTE — Telephone Encounter (Signed)
   Name: Robert [name]  DOB: [birth year]  MRN: [id number]  Primary Cardiologist: Ahmad Alert, MD   Preoperative team, please contact this patient and set up a phone call appointment ASAP for further preoperative risk assessment. Please obtain consent and complete medication review. Thank you for your help.  I confirm that guidance regarding antiplatelet and oral anticoagulation therapy has been completed and, if necessary, noted below.  Patient with diagnosis of afib on Eliquis  for anticoagulation.     Procedure: LUMBAR EPI  Date of procedure: Urgent     CHA2DS2-VASc Score = 3   This indicates a 3.2% annual risk of stroke. The patient's score is based upon: CHF History: 0 HTN History: 1 Diabetes History: 1 Stroke History: 0 Vascular Disease History: 0 Age Score: 1 Gender Score: 0       CrCl 63 ml/min Platelet count 232   Patient has not had an Afib/aflutter ablation within the last 3 months or DCCV within the last 30 days   Per office protocol, patient can hold Eliquis  for 3 days prior to procedure.        I also confirmed the patient resides in the state of [place] . As per [facility] Medical Board telemedicine laws, the patient must reside in the state in which the provider is licensed.   Friddie Jetty, NP 04/12/2024, 1:42 PM [facility]

## 2024-04-12 NOTE — Telephone Encounter (Signed)
 Spoke with pt and scheduled TELE Preop appt 04/18/24. med rec and consent done    Will update surgeons office.

## 2024-04-12 NOTE — Telephone Encounter (Signed)
 Patient with diagnosis of afib on Eliquis  for anticoagulation.    Procedure: LUMBAR EPI  Date of procedure: Urgent   CHA2DS2-VASc Score = 3   This indicates a 3.2% annual risk of stroke. The patient's score is based upon: CHF History: 0 HTN History: 1 Diabetes History: 1 Stroke History: 0 Vascular Disease History: 0 Age Score: 1 Gender Score: 0      CrCl 63 ml/min Platelet count 232  Patient has not had an Afib/aflutter ablation within the last 3 months or DCCV within the last 30 days  Per office protocol, patient can hold Eliquis  for 3 days prior to procedure.    **This guidance is not considered finalized until pre-operative APP has relayed final recommendations.**

## 2024-04-15 ENCOUNTER — Encounter: Payer: Self-pay | Admitting: Hematology

## 2024-04-15 NOTE — Telephone Encounter (Signed)
 Left message for pt to return call to Dr Bernadene to discuss how he did with his recent treatment.

## 2024-04-15 NOTE — Telephone Encounter (Signed)
-----   Message from Nurse Milo KIDD sent at 04/11/2024  4:36 PM EDT ----- Regarding: New treatment 1st time Dr. Demetra patient. First time nivolumab -Hyaluronidase -nvhy (OPDIVO  QVANTIG). Tolerated injection well.

## 2024-04-15 NOTE — Telephone Encounter (Signed)
 Called pt back after he returned call.  He had called on-call RN on Sun to report swelling in his feet with redness & states L is worse than R.  He reports drinking fluids well & urinating well.  He was told to double up on his lasix.  He took 3 tabs yest & today & only little difference.  He denies any other symptoms.  He states feet are warm but not hot.  Caretaker in the home verified above.  Suggested elevating feet but he state he has back problems & that is hard for him to do but will try.  He has an appt with Dr Garald PCP on Wed.  Suggested he call PCP if worse or no better.  Message to Dr Bernadene.

## 2024-04-16 ENCOUNTER — Other Ambulatory Visit: Payer: Self-pay | Admitting: *Deleted

## 2024-04-16 NOTE — Patient Instructions (Signed)
 Visit Information  Thank you for taking time to visit with me today. Please don't hesitate to contact me if I can be of assistance to you before our next scheduled telephone appointment.  Our next appointment is by telephone on Wednesday 04/24/24 at 1:00 pm  Please call the care guide team at [phone] if you need to cancel or reschedule your appointment.   Following are the goals we discussed today:  Patient Self Care Activities:  Attend all scheduled provider appointments Call pharmacy for medication refills 3-7 days in advance of running out of medications Call provider office for new concerns or questions  Participate in Transition of Care Program/Attend TOC scheduled calls Take medications as prescribed   Continue pacing activity to avoid and minimize pain  Continue to use assistive devices as needed to prevent falls- your walker If you believe your condition is getting worse- contact your care providers (doctors) promptly- reaching out to your doctor early when you have concerns can prevent you from having to go to the hospital Continue to monitor your blood pressures, weights, oxygen levels, and blood sugars at home and write them down on paper  If you are experiencing a Mental Health or Behavioral Health Crisis or need someone to talk to, please  call the Suicide and Crisis Lifeline: 988 call the [place]  National Suicide Prevention Lifeline: (540)724-8056 or TTY: 226-263-3750 TTY 8322234666) to talk to a trained counselor call 1-800-273-TALK (toll free, 24 hour hotline) go to [facility] [address] [place] [phone]) call the [facility] Crisis Line: [phone] call 911   Patient verbalizes understanding of instructions and care plan provided today and agrees to view in MyChart. Active MyChart status and patient understanding of how to access instructions and care plan via MyChart confirmed with patient.     Robert [name]  [name] Cedarius Kersh, RN, BSN, Media planner  Transitions of Care  [facility] - [facility] [phone]: direct office

## 2024-04-16 NOTE — Transitions of Care (Post Inpatient/ED Visit) (Signed)
 Transition of Care week 3/ day # 12  Visit Note  04/16/2024  Name: Robert [name] MRN: [id number]          DOB: 1951/01/15  Situation: Patient enrolled in [facility] 30-day program. Visit completed with patient by telephone.   HIPAA identifiers x 2 verified  Background:  Recent hospital visit April 13-16, 2025 for fever/ chills- UTI bacteremia/ hydronephrosis with placement of (L) ureteral stent in setting of baseline bladder CA with urostomy since February 2025 Hospital readmission June 5-11, 2025 for severe back pain due to acute lumbar radiculpoathy; with complicated UTI/ bacteremia due to infected chemotherapy port: port removed during hospitalization Ongoing back pain post- most recent hospital discharge- with significant mobility concerns Caregiver for spouse who has dementia: spouse has 24/7 care per patient report: caregivers also assist with his care as indicated, including providing transportation to his provider appointments [facility] LCSW team active/ established  Initial Transition Care Management Follow-up Telephone Call    Past Medical History:  Diagnosis Date   Anticoagulant long-term use    eliquis --- managed by cardiology   Arthritis    Bladder cancer [facility])    urologist--- dr nieves;  overlapping   Cataract    History of basal cell carcinoma (BCC) excision    History of COVID-19 05/2019   per pt mild symptoms that resolved   HLD (hyperlipidemia)    HTN (hypertension)    Hx of colonic polyps    Insulin  dependent type 2 diabetes mellitus [facility])    endocrinologist--- dr trixie --  (03-11-2022 per pt check blood sugar 1-2 times daily,  fasting sugar-- 118-120s)   Nocturia    Paroxysmal atrial fibrillation (HCC) 03/12/2019   cardiologist--- dr calhoun   Renal disorder    Ureterocele    Use of cane as ambulatory aid    Assessment:  I am still doing okay I guess, I'm just having to lie flat most of the time to control the pain.  Just doing the best I can; going  to try and get to the doctor's office tomorrow, because I think I will be needing another refill of this pain medication.  Waiting to hear from the back people about the epidural injection-- but I have to have clearance from all my doctors first.  My ankles are a less swollen than they were, and I am still taking up to 3 pills of the fluid pills every day like they told me to me to take.  All of my blood sugars and blood pressures look okay    Denies clinical concerns and sounds to be in no distress throughout TOC 30-day program outreach call today  Patient Reported Symptoms: Cognitive Cognitive Status: Alert and oriented to person, place, and time, Insightful and able to interpret abstract concepts, Normal speech and language skills Cognitive/Intellectual Conditions Management [RPT]: None reported or documented in medical history or problem list      Neurological Neurological Review of Symptoms: No symptoms reported Oher Neurological Symptoms/Conditions [RPT]: Ongoing lumbar pain: pain assessment completed    HEENT HEENT Symptoms Reported: No symptoms reported      Cardiovascular Cardiovascular Symptoms Reported: Swelling in legs or feet Other Cardiovascular Symptoms: Reports swelling in feet and legs are better; confirms using prn diuretic- up to 3 pills per day- prn; provided education around risks of dehydration; need to discuss dosing with PCP during scheduled office visit tomorrow 04/17/24: he verbalizes agreement and understanding with same; unable to complete daily weights at home due to acute  ongoing back pain post- most recent hospital discharge; discussed need to discuss current pain management/ need for any refills with PCP during office visit scheduled tomorrow- he verbalizes understanding of same; states he will do Does patient have uncontrolled Hypertension?: No Cardiovascular Conditions: Hypertension Cardiovascular Management Strategies: Medication therapy, Routine screening,  Coping strategies  Respiratory Respiratory Symptoms Reported: No symptoms reported Other Respiratory Symptoms: Denies shortness of breath/ cough; sounds to be in no respiratory distress throughout [facility] call    Endocrine Patient reports the following symptoms related to hypoglycemia or hyperglycemia : No symptoms reported Is patient diabetic?: Yes Is patient checking blood sugars at home?: Yes Endocrine Conditions: Diabetes Endocrine Management Strategies: Medication therapy, Routine screening, Diet modification, Coping strategies  Gastrointestinal Gastrointestinal Symptoms Reported: No symptoms reported Additional Gastrointestinal Details: Continues to report regular and normal blowel movements post-hospital discharge; provided education around common side-effects of narcotic pain medication: he is already aware and is able to verbalize strategies to prevent constipation without prompting Gastrointestinal Management Strategies: Coping strategies    Genitourinary Genitourinary Symptoms Reported: No symptoms reported Other Genitourinary Symptoms: Reports urine in urostomy looks fine- nice clear yellow; confirms completed course of antibiotics; confirms continues to self-manage urostomy independently Genitourinary Conditions: Bladder cancer, Chronic kidney disease, Urinary tract infection Genitourinary Management Strategies: Medical device, Coping strategies, Urostomy  Integumentary Integumentary Symptoms Reported: No symptoms reported Additional Integumentary Details: Continues to report urostomy site looks fine, no redness, no problems Skin Conditions: Other Other Skin Conditions: urostomy Skin Management Strategies: Routine screening, Coping strategies  Musculoskeletal Musculoskelatal Symptoms Reviewed: Difficulty walking Additional Musculoskeletal Details: confirmed currently requiring/ using assistive devices for ambulation - walker; reports fluctuating pain levels around activity;  states I am having to lie flat a lot to control the pain; again recommended he ensure that he attends hospital follow up provider appointments as scheduled- he verbalizes understanding and agreement Musculoskeletal Conditions: Back pain, Joint pain, Mobility limited Musculoskeletal Management Strategies: Routine screening, Medication therapy, Coping strategies      Psychosocial Psychosocial Symptoms Reported: No symptoms reported         Vitals:   04/16/24 1255  BP: (!) 140/76    Medications Reviewed Today     Reviewed by Darleth Eustache M, RN (Registered Nurse) on 04/16/24 at 1314  Med List Status: <None>   Medication Order Taking? Sig Documenting Provider Last Dose Status Informant  amoxicillin -clavulanate (AUGMENTIN ) 875-125 MG tablet [id number]  Take 1 tablet by mouth 2 (two) times daily.  Patient not taking: Reported on 04/16/2024   Arlon Carliss ORN, DO  Active   apixaban  (ELIQUIS ) 5 MG TABS tablet [id number] Yes Take 1 tablet (5 mg total) by mouth 2 (two) times daily. Nahser, Aleene PARAS, MD  Active Self  atorvastatin  (LIPITOR) 40 MG tablet [id number] Yes Take 1 tablet (40 mg total) by mouth at bedtime. Cheryle Page, MD  Active Self  B Complex-C (B-COMPLEX WITH VITAMIN C) tablet [id number] Yes Take 1 tablet by mouth 3 (three) times a week. [provider]  Active Self           Med Note ROLENE MNIMPHLZS, SUSAN A   Sun Feb 04, 2024  4:51 PM)    Cholecalciferol (VITAMIN D-3 PO) [id number] Yes Take 1 tablet by mouth 3 (three) times a week. [provider]  Active Self           Med Note ALLEGRA, [facility]   Wed Mar 27, 2024 10:04 PM)    EPINEPHrine  0.3 mg/0.3 mL IJ SOAJ injection [id number]  Yes Inject 0.3 mg into the muscle as needed for anaphylaxis. Victor Lynwood DASEN, PA-C  Active Self  furosemide (LASIX) 20 MG tablet [id number] Yes Take 1-2 tablets (20-40 mg total) by mouth daily as needed.  Patient taking differently: Take 20-40 mg by mouth daily as needed for  edema. 04/16/24: Reports during TOC call, a doctor (unsure which doctor) told him he could take up to 3 pills at a time- reports taking between 1-3 pills prn accordingly; reports currently taking about every day   Plotnikov, Aleksei V, MD  Active   glucose blood test strip [id number] Yes 1 each by Other route daily. Use daily for glucose control - Dx 250.00 Theophilus Andrews, Estela Y, MD  Active Self  lidocaine  (LIDODERM ) 5 % [id number] Yes Place 1 patch onto the skin daily. Remove & Discard patch within 12 hours or as directed by MD Arlon Carliss ORN, DO  Active   methocarbamol  (ROBAXIN ) 500 MG tablet [id number] Yes Take 1 tablet (500 mg total) by mouth every 8 (eight) hours as needed for muscle spasms. Arlon Carliss ORN, DO  Active   metoprolol  succinate (TOPROL -XL) 25 MG 24 hr tablet [id number] Yes Take 1 tablet (25 mg total) by mouth daily. Nahser, Aleene PARAS, MD  Active Self  oxyCODONE  (OXY IR/ROXICODONE ) 5 MG immediate release tablet [id number] Yes Take 1 tablet (5 mg total) by mouth every 6 (six) hours as needed for moderate pain (pain score 4-6). Henson, Vickie L, NP-C  Active   pregabalin  (LYRICA ) 75 MG capsule [id number] Yes Take 1 capsule (75 mg total) by mouth daily. Arlon Carliss ORN, DO  Active   propranolol  (INDERAL ) 10 MG tablet [id number] Yes TAKE 1 TABLET BY MOUTH 4 TIMES DAILY AS NEEDED FOR PALPITATIONS, IRREGULAR OR FAST HEART RATE Nahser, Aleene PARAS, MD  Active Self           Med Note MARISA, NATHANEL SAILOR   Fri Mar 01, 2024  7:02 PM) I was unable to track any last fill of this. The patient stated he takes this intermittently.  Semaglutide , 1 MG/DOSE, (OZEMPIC , 1 MG/DOSE,) 4 MG/3ML SOPN [id number] Yes INJECT 1 MG UNDER THE SKIN ONCE A WEEK AS DIRECTED Trixie File, MD  Active Self           Med Note MARISA, NATHANEL SAILOR   Fri Mar 01, 2024  5:59 PM)    sodium chloride  flush (NS) 0.9 % injection 10 mL [id number]   Lanny Callander, MD  Active            Recommendation:   PCP Follow-up- as scheduled  04/17/24 Specialty provider follow-up- cardiology for proecural clearance 04/18/24; ID provider 04/23/24  Follow Up Plan:   Telephone follow-up in 1 week- as planned 04/24/24  Plan for next week's call: Ongoing pain assessment/ use of assistive devices; ability to mobilize around pain levels Review medication adherence/ concerns- ? Refill for narcotic pain medication at time of PCP office visit 04/17/24 Review upcoming provider office visits as above: 04/17/24: PCP; 04/18/24: cardiology- telephone visit for procedure clearance; 04/23/24: ID provider Review blood pressures, SaO2, weights, blood sugars at home- assess ankle swelling/ use of diuretics Reinforce/ provide education re: signs/ symptoms UTI along with action plan: urostomy management at home  Pls call/ message for questions,  Beatris Blinda Lawrence, RN, BSN, Media planner  Transitions of Care  [facility] - [facility] [phone]: direct office

## 2024-04-16 NOTE — Patient Instructions (Signed)
 Visit Information  Thank you for taking time to visit with me today. Please don't hesitate to contact me if I can be of assistance to you before our next scheduled appointment.  Your next care management appointment is by telephone on 05/02/24 at 11am   Please call the care guide team at [phone] if you need to cancel, schedule, or reschedule an appointment.   Please call the Suicide and Crisis Lifeline: 988 if you are experiencing a Mental Health or Behavioral Health Crisis or need someone to talk to.  Crystelle Ferrufino, LCSW [place]  [facility], [facility] Licensed Clinical Social Worker  Direct Dial: [phone]

## 2024-04-16 NOTE — Patient Outreach (Signed)
 Complex Care Management   Visit Note  04/16/2024  Name:  Robert [name] MRN: [id number] DOB: [birth year]  Situation: Referral received for Complex Care Managempaent related to caregiver stress I obtained verbal consent from Patient.  Visit completed with patient  on the phone  Background:   Past Medical History:  Diagnosis Date   Anticoagulant long-term use    eliquis --- managed by cardiology   Arthritis    Bladder cancer [facility])    urologist--- dr nieves;  overlapping   Cataract    History of basal cell carcinoma (BCC) excision    History of COVID-19 05/2019   per pt mild symptoms that resolved   HLD (hyperlipidemia)    HTN (hypertension)    Hx of colonic polyps    Insulin  dependent type 2 diabetes mellitus [facility])    endocrinologist--- dr trixie --  (03-11-2022 per pt check blood sugar 1-2 times daily,  fasting sugar-- 118-120s)   Nocturia    Paroxysmal atrial fibrillation (HCC) 03/12/2019   cardiologist--- dr calhoun   Renal disorder    Ureterocele    Use of cane as ambulatory aid     Assessment: Patient Reported Symptoms:  Cognitive Cognitive Status: Alert and oriented to person, place, and time, Insightful and able to interpret abstract concepts, Normal speech and language skills Cognitive/Intellectual Conditions Management [RPT]: None reported or documented in medical history or problem list   Health Maintenance Behaviors: Stress management, Social activities Health Facilitated by: Pain control  Neurological Neurological Review of Symptoms: No symptoms reported    HEENT HEENT Symptoms Reported: No symptoms reported      Cardiovascular Cardiovascular Symptoms Reported: Swelling in legs or feet Does patient have uncontrolled Hypertension?: No Cardiovascular Conditions: Hypertension Cardiovascular Management Strategies: Medication therapy, Routine screening  Respiratory Respiratory Symptoms Reported: No symptoms reported    Endocrine Patient reports the  following symptoms related to hypoglycemia or hyperglycemia : No symptoms reported    Gastrointestinal Gastrointestinal Symptoms Reported: No symptoms reported      Genitourinary Genitourinary Symptoms Reported: No symptoms reported Genitourinary Conditions: Bladder cancer, Chronic kidney disease, Urinary tract infection  Integumentary Integumentary Symptoms Reported: No symptoms reported    Musculoskeletal Musculoskelatal Symptoms Reviewed: Difficulty walking Additional Musculoskeletal Details: uses walker to ambulate Musculoskeletal Conditions: Back pain, Mobility limited Other Musculoskeletal Conditions: recent hospitalization for lumbar radiculopathy Musculoskeletal Management Strategies: Routine screening, Medication therapy Musculoskeletal Self-Management Outcome: 4 (good) Falls in the past year?: No Number of falls in past year: 1 or less Was there an injury with Fall?: No Fall Risk Category Calculator: 0 Patient Fall Risk Level: Low Fall Risk    Psychosocial Psychosocial Symptoms Reported: Depression - if selected complete PHQ 2-9 Other Psychosocial Conditions: 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, has 24/7 care for patient-confirmed that neighbors assists with meals and check in with them reguarly Behavioral Health Conditions: Depression Behavioral Management Strategies: Adequate rest, Support system Behavioral Health Self-Management Outcome: 4 (good) Behavioral Health Comment: patient agreeable to ongoing mental health support-resources provided Major Change/Loss/Stressor/Fears (CP): Medical condition, self, Medical condition, family Behaviors When Feeling Stressed/Fearful: watches baseball, solitare, taking things one day at time, perspective changes, use of postive self talk Techniques to Cope with Loss/Stress/Change: Diversional activities Quality of Family Relationships: supportive Do you feel physically threatened by others?: No      04/16/2024    2:10 PM   Depression screen PHQ 2/9  Decreased Interest 0  Down, Depressed, Hopeless 1  PHQ - 2 Score 1  Altered sleeping 0  Tired, decreased energy 0  Change in appetite 0  Feeling bad or failure about yourself  0  Trouble concentrating 0  Moving slowly or fidgety/restless 0  PHQ-9 Score 1    There were no vitals filed for this visit.  Medications Reviewed Today     Reviewed by Ermalinda Lenn HERO, LCSW (Social Worker) on 04/16/24 at 1407  Med List Status: <None>   Medication Order Taking? Sig Documenting Provider Last Dose Status Informant  amoxicillin -clavulanate (AUGMENTIN ) 875-125 MG tablet [id number]  Take 1 tablet by mouth 2 (two) times daily.  Patient not taking: Reported on 04/16/2024   Arlon Carliss ORN, DO  Active   apixaban  (ELIQUIS ) 5 MG TABS tablet [id number] Yes Take 1 tablet (5 mg total) by mouth 2 (two) times daily. Nahser, Aleene PARAS, MD  Active Self  atorvastatin  (LIPITOR) 40 MG tablet [id number] Yes Take 1 tablet (40 mg total) by mouth at bedtime. Cheryle Page, MD  Active Self  B Complex-C (B-COMPLEX WITH VITAMIN C) tablet [id number] Yes Take 1 tablet by mouth 3 (three) times a week. [provider]  Active Self           Med Note ROLENE MNIMPHLZS, SUSAN A   Sun Feb 04, 2024  4:51 PM)    Cholecalciferol (VITAMIN D-3 PO) [id number] Yes Take 1 tablet by mouth 3 (three) times a week. [provider]  Active Self           Med Note ALLEGRA, [facility]   Wed Mar 27, 2024 10:04 PM)    EPINEPHrine  0.3 mg/0.3 mL IJ SOAJ injection [id number]  Inject 0.3 mg into the muscle as needed for anaphylaxis. Victor Lynwood DASEN, PA-C  Active Self  furosemide (LASIX) 20 MG tablet [id number] Yes Take 1-2 tablets (20-40 mg total) by mouth daily as needed.  Patient taking differently: Take 20-40 mg by mouth daily as needed for edema. 04/16/24: Reports during TOC call, a doctor (unsure which doctor) told him he could take up to 3 pills at a time- reports taking between 1-3 pills prn  accordingly; reports currently taking about every day   Plotnikov, Aleksei V, MD  Active   glucose blood test strip [id number] Yes 1 each by Other route daily. Use daily for glucose control - Dx 250.00 Theophilus Andrews, Tully GRADE, MD  Active Self  lidocaine  (LIDODERM ) 5 % [id number] Yes Place 1 patch onto the skin daily. Remove & Discard patch within 12 hours or as directed by MD Arlon Carliss ORN, DO  Active   methocarbamol  (ROBAXIN ) 500 MG tablet [id number] Yes Take 1 tablet (500 mg total) by mouth every 8 (eight) hours as needed for muscle spasms. Arlon Carliss ORN, DO  Active   metoprolol  succinate (TOPROL -XL) 25 MG 24 hr tablet [id number] Yes Take 1 tablet (25 mg total) by mouth daily. Nahser, Aleene PARAS, MD  Active Self  oxyCODONE  (OXY IR/ROXICODONE ) 5 MG immediate release tablet [id number] Yes Take 1 tablet (5 mg total) by mouth every 6 (six) hours as needed for moderate pain (pain score 4-6). Henson, Vickie L, NP-C  Active   pregabalin  (LYRICA ) 75 MG capsule [id number] Yes Take 1 capsule (75 mg total) by mouth daily. Arlon Carliss ORN, DO  Active   propranolol  (INDERAL ) 10 MG tablet [id number] Yes TAKE 1 TABLET BY MOUTH 4 TIMES DAILY AS NEEDED FOR PALPITATIONS, IRREGULAR OR FAST HEART RATE Nahser, Aleene PARAS, MD  Active Self           Med Note [place], [facility]  N   Fri Mar 01, 2024  7:02 PM) I was unable to track any last fill of this. The patient stated he takes this intermittently.  Semaglutide , 1 MG/DOSE, (OZEMPIC , 1 MG/DOSE,) 4 MG/3ML SOPN [id number]  INJECT 1 MG UNDER THE SKIN ONCE A WEEK AS DIRECTED Trixie File, MD  Active Self           Med Note MARISA, NATHANEL SAILOR   Fri Mar 01, 2024  5:59 PM)    sodium chloride  flush (NS) 0.9 % injection 10 mL [id number]   Lanny Callander, MD  Active             Recommendation:   PCP Follow-up Specialty provider follow-up as scheduled Contact [facility] for ongoing mental health therapy(CSW to provide additional resources by mail)  Follow Up Plan:   Telephone  follow-up 05/02/24  Lenn Mean, LCSW [facility], [facility] Licensed Clinical Social Worker  Direct Dial: [phone]

## 2024-04-17 ENCOUNTER — Encounter: Payer: Self-pay | Admitting: Internal Medicine

## 2024-04-17 ENCOUNTER — Ambulatory Visit (INDEPENDENT_AMBULATORY_CARE_PROVIDER_SITE_OTHER): Admitting: Internal Medicine

## 2024-04-17 VITALS — BP 110/62 | HR 87 | Temp 98.0°F | Ht 74.0 in | Wt 186.0 lb

## 2024-04-17 DIAGNOSIS — Z7901 Long term (current) use of anticoagulants: Secondary | ICD-10-CM | POA: Diagnosis not present

## 2024-04-17 DIAGNOSIS — R7881 Bacteremia: Secondary | ICD-10-CM | POA: Diagnosis not present

## 2024-04-17 DIAGNOSIS — M545 Low back pain, unspecified: Secondary | ICD-10-CM | POA: Insufficient documentation

## 2024-04-17 DIAGNOSIS — N390 Urinary tract infection, site not specified: Secondary | ICD-10-CM | POA: Diagnosis not present

## 2024-04-17 DIAGNOSIS — M5441 Lumbago with sciatica, right side: Secondary | ICD-10-CM

## 2024-04-17 MED ORDER — OXYCODONE HCL 5 MG PO TABS: 5.0000 mg | ORAL_TABLET | Freq: Four times a day (QID) | ORAL | 0 refills | Status: DC | PRN

## 2024-04-17 NOTE — Assessment & Plan Note (Addendum)
-  Status post cystoprostatectomy, total ureterectomy and right nephro ureterectomy and solitary left kidney status post ureterostomy.  Oncology was following and in agreement and okay with Port-A-Cath removal as recommended per ID due to bacteremia.  Patient to follow-up with oncology in the outpatient setting.  Enterococcus faecalis bacteremia secondary to complicated urinary tract infection  - UCx showing Enterococcus with sensitivity to ampicillin .  Was placed on ceftriaxone  plus ampicillin .  Seen by infectious disease.  TEE negative.  Port removed 6/9.  Patient to transition to p.o. amoxicillin  upon discharge x 2 weeks, last day 6/20.  Patient will need follow-up with infectious disease in the outpatient setting.  Furl sent.

## 2024-04-17 NOTE — Assessment & Plan Note (Signed)
 On Eliquis

## 2024-04-17 NOTE — Assessment & Plan Note (Addendum)
 Enterococcus faecalis bacteremia secondary to complicated urinary tract infection  - UCx showing Enterococcus with sensitivity to ampicillin .  Was placed on ceftriaxone  plus ampicillin .  Seen by infectious disease.  TEE negative.  Port removed 6/9.  Patient to transition to p.o. amoxicillin  upon discharge x 2 weeks, last day 6/20.  Patient will need follow-up with infectious disease in the outpatient setting.  Furl sent.  FTT: RN/PT/OT at home

## 2024-04-17 NOTE — Assessment & Plan Note (Addendum)
 Severe lower back pain secondary to lumbar radiculopathy - Neurosurgery recommending IR initially for epidural steroid injection.  This is on hold secondary to infection below.  Received IV Decadron , Lyrica , muscle relaxer with marked improvement in pain.  Will transition patient over to Medrol  Dosepak upon discharge.  Will also give prescription for Robaxin , Lyrica .  Patient will need to follow-up in outpatient for lumbar injection. Oxycodone  prn  Potential benefits of a short or long term opioids use as well as potential risks (i.e. addiction risk, apnea etc) and complications (i.e. Somnolence, constipation and others) were explained to the patient and were aknowledged. Epidural is pending if cleared by ID  RN/PT/OT at home

## 2024-04-17 NOTE — Patient Instructions (Signed)
 Infrared heating pad  - [employer]

## 2024-04-17 NOTE — Progress Notes (Signed)
 Subjective:  Patient ID: Robert [name] PONCE, male    DOB: 07/02/1951  Age: 73 y.o. MRN: [id number]  CC: Hospitalization Follow-up (Pt is asking that the quantity of the pain medication be on a 30 day supply basis due to pain for every 6 hours.)   HPI Robert [name] II presents for severe R back pain, R leg pain Pt went to [facility] ER - he was admitted w/UTI, LBP for pain control. He finished abx 2 d ago. Pain is 2/10 - 10/10. Oxy helps - he has to take it 4 times a day  Per Hx:  Admit date:     03/27/2024  Discharge date: 04/03/24  Discharge Physician: Carliss LELON Canales    PCP: Garald Karlynn GAILS, MD    Recommendations at discharge:     Pt to be discharged home.   If you experience worsening fever, chills, chest pain, shortness of breath, or other concerning symptoms, please call your PCP or go to the emergency department immediately.   Discharge Diagnoses: Principal Problem:   Lumbar radiculopathy Active Problems:   Bacteremia   Bladder cancer (HCC)   Complicated UTI (urinary tract infection)   Paroxysmal A-fib (HCC)   Hyponatremia   CKD (chronic kidney disease), stage III (HCC)   Resolved Problems:   * No resolved hospital problems. [facility] Course:   Patient is a 73 year old gentleman with history of paroxysmal A-fib on Eliquis , hypertension, hyperlipidemia, type 2 diabetes, metastatic bladder cancer status post cystoprostatectomy, total neurectomy and right nephroureterectomy and solitary left kidney status post ureterostomy currently on immunotherapy for bladder cancer recently admitted for AKI from 03/01/2024 to 03/02/2024 with mild to moderate hydronephrosis with stent retracted to the mid ureter on imaging whereby patient underwent stent exchange per urology with improvement with renal function. Patient presenting to the ED with complaint of severe low back pain with radiation to the right lower extremity causing difficulty ambulating and standing.  Patient also  noted to have a complicated UTI placed on IV antibiotics and pancultured.  Blood cultures positive for Enterococcus faecalis.  ID consulted.  IR also consulted for evaluation for epidural steroid injection however recommended outpatient referral.  ID following, patient for TEE 04/02/2024.   Assessment and Plan:   Severe lower back pain secondary to lumbar radiculopathy - Neurosurgery recommending IR initially for epidural steroid injection.  This is on hold secondary to infection below.  Received IV Decadron , Lyrica , muscle relaxer with marked improvement in pain.  Will transition patient over to Medrol  Dosepak upon discharge.  Will also give prescription for Robaxin , Lyrica .  Patient will need to follow-up in outpatient for lumbar injection.   Enterococcus faecalis bacteremia secondary to complicated urinary tract infection  - UCx showing Enterococcus with sensitivity to ampicillin .  Was placed on ceftriaxone  plus ampicillin .  Seen by infectious disease.  TEE negative.  Port removed 6/9.  Patient to transition to p.o. amoxicillin  upon discharge x 2 weeks, last day 6/20.  Patient will need follow-up with infectious disease in the outpatient setting.  Furl sent.   Metastatic bladder cancer -Status post cystoprostatectomy, total ureterectomy and right nephro ureterectomy and solitary left kidney status post ureterostomy.  Oncology was following and in agreement and okay with Port-A-Cath removal as recommended per ID due to bacteremia.  Patient to follow-up with oncology in the outpatient setting.     Consultants: Cardiology, oncology, infectious disease  Outpatient Medications Prior to Visit  Medication Sig Dispense Refill   apixaban  (  ELIQUIS ) 5 MG TABS tablet Take 1 tablet (5 mg total) by mouth 2 (two) times daily. 180 tablet 1   atorvastatin  (LIPITOR) 40 MG tablet Take 1 tablet (40 mg total) by mouth at bedtime.     B Complex-C (B-COMPLEX WITH VITAMIN C) tablet Take 1 tablet by mouth 3 (three)  times a week.     Cholecalciferol (VITAMIN D-3 PO) Take 1 tablet by mouth 3 (three) times a week.     EPINEPHrine  0.3 mg/0.3 mL IJ SOAJ injection Inject 0.3 mg into the muscle as needed for anaphylaxis. 1 each 0   furosemide (LASIX) 20 MG tablet Take 1-2 tablets (20-40 mg total) by mouth daily as needed. (Patient taking differently: Take 20-40 mg by mouth daily as needed for edema. 04/16/24: Reports during TOC call, a doctor (unsure which doctor) told him he could take up to 3 pills at a time- reports taking between 1-3 pills prn accordingly; reports currently taking about every day) 60 tablet 3   glucose blood test strip 1 each by Other route daily. Use daily for glucose control - Dx 250.00 100 each 0   lidocaine  (LIDODERM ) 5 % Place 1 patch onto the skin daily. Remove & Discard patch within 12 hours or as directed by MD 30 patch 0   methocarbamol  (ROBAXIN ) 500 MG tablet Take 1 tablet (500 mg total) by mouth every 8 (eight) hours as needed for muscle spasms. 60 tablet 0   metoprolol  succinate (TOPROL -XL) 25 MG 24 hr tablet Take 1 tablet (25 mg total) by mouth daily. 90 tablet 2   pregabalin  (LYRICA ) 75 MG capsule Take 1 capsule (75 mg total) by mouth daily. 30 capsule 0   propranolol  (INDERAL ) 10 MG tablet TAKE 1 TABLET BY MOUTH 4 TIMES DAILY AS NEEDED FOR PALPITATIONS, IRREGULAR OR FAST HEART RATE 270 tablet 1   Semaglutide , 1 MG/DOSE, (OZEMPIC , 1 MG/DOSE,) 4 MG/3ML SOPN INJECT 1 MG UNDER THE SKIN ONCE A WEEK AS DIRECTED 9 mL 3   oxyCODONE  (OXY IR/ROXICODONE ) 5 MG immediate release tablet Take 1 tablet (5 mg total) by mouth every 6 (six) hours as needed for moderate pain (pain score 4-6). 14 tablet 0   amoxicillin -clavulanate (AUGMENTIN ) 875-125 MG tablet Take 1 tablet by mouth 2 (two) times daily. (Patient not taking: Reported on 04/17/2024) 14 tablet 0   Facility-Administered Medications Prior to Visit  Medication Dose Route Frequency Provider Last Rate Last Admin   sodium chloride  flush (NS)  0.9 % injection 10 mL  10 mL Intracatheter PRN Lanny Callander, MD   10 mL at 02/15/24 1353    ROS: Review of Systems  Constitutional:  Negative for appetite change, fatigue and unexpected weight change.  HENT:  Negative for congestion, nosebleeds, sneezing, sore throat and trouble swallowing.   Eyes:  Negative for itching and visual disturbance.  Respiratory:  Negative for cough.   Cardiovascular:  Negative for chest pain, palpitations and leg swelling.  Gastrointestinal:  Negative for abdominal distention, blood in stool, diarrhea and nausea.  Genitourinary:  Negative for frequency and hematuria.  Musculoskeletal:  Positive for arthralgias, back pain and gait problem. Negative for joint swelling and neck pain.  Skin:  Negative for color change and rash.  Neurological:  Positive for weakness. Negative for dizziness, tremors and speech difficulty.  Psychiatric/Behavioral:  Negative for agitation, dysphoric mood, sleep disturbance and suicidal ideas. The patient is not nervous/anxious.     Objective:  BP 110/62   Pulse 87   Temp 98 F (36.7  C) (Oral)   Ht 6' 2 (1.88 m)   Wt 186 lb (84.4 kg)   SpO2 (!) 87%   BMI 23.88 kg/m   BP Readings from Last 3 Encounters:  04/17/24 110/62  04/16/24 (!) 140/76  04/11/24 124/62    Wt Readings from Last 3 Encounters:  04/17/24 186 lb (84.4 kg)  04/12/24 196 lb (88.9 kg)  04/11/24 193 lb 4.8 oz (87.7 kg)    Physical Exam Constitutional:      General: He is not in acute distress.    Appearance: He is well-developed. He is obese.     Comments: NAD  HENT:     Nose: No rhinorrhea.   Eyes:     Conjunctiva/sclera: Conjunctivae normal.     Pupils: Pupils are equal, round, and reactive to light.   Neck:     Thyroid : No thyromegaly.     Vascular: No JVD.   Cardiovascular:     Rate and Rhythm: Normal rate and regular rhythm.     Heart sounds: Normal heart sounds. No murmur heard.    No friction rub. No gallop.  Pulmonary:     Effort:  Pulmonary effort is normal. No respiratory distress.     Breath sounds: Normal breath sounds. No wheezing or rales.  Chest:     Chest wall: No tenderness.  Abdominal:     General: Bowel sounds are normal. There is no distension.     Palpations: Abdomen is soft. There is no mass.     Tenderness: There is no abdominal tenderness. There is no guarding or rebound.   Musculoskeletal:        General: Tenderness present. Normal range of motion.     Cervical back: Normal range of motion.     Right lower leg: Edema present.     Left lower leg: Edema present.  Lymphadenopathy:     Cervical: No cervical adenopathy.   Skin:    General: Skin is warm and dry.     Findings: No rash.   Neurological:     Mental Status: He is alert and oriented to person, place, and time.     Cranial Nerves: No cranial nerve deficit.     Motor: No abnormal muscle tone.     Coordination: Coordination normal.     Gait: Gait normal.     Deep Tendon Reflexes: Reflexes are normal and symmetric.   Psychiatric:        Behavior: Behavior normal.        Thought Content: Thought content normal.        Judgment: Judgment normal.   Port site w/a small swelling - no d/c Using a walker, very weak LS w/pain  Lab Results  Component Value Date   WBC 16.2 (H) 04/11/2024   HGB 10.9 (L) 04/11/2024   HCT 32.0 (L) 04/11/2024   PLT 232 04/11/2024   GLUCOSE 295 (H) 04/11/2024   CHOL 116 02/27/2023   TRIG 150.0 (H) 02/27/2023   HDL 38.60 (L) 02/27/2023   LDLDIRECT 77.0 07/07/2015   LDLCALC 47 02/27/2023   ALT 17 04/11/2024   AST 11 (L) 04/11/2024   NA 129 (L) 04/11/2024   K 4.4 04/11/2024   CL 95 (L) 04/11/2024   CREATININE 1.31 (H) 04/11/2024   BUN 27 (H) 04/11/2024   CO2 27 04/11/2024   TSH 1.140 04/11/2024   PSA 6.51 (H) 02/27/2023   HGBA1C 7.1 (A) 02/13/2024   MICROALBUR 41.4 02/13/2024    MR Lumbar Spine W Wo  Contrast Result Date: 03/27/2024 CLINICAL DATA:  Low back pain, cancer suspected Low back pain,  cauda equina syndrome suspected. EXAM: MRI LUMBAR SPINE WITHOUT AND WITH CONTRAST TECHNIQUE: Multiplanar and multiecho pulse sequences of the lumbar spine were obtained without and with intravenous contrast. CONTRAST:  9mL GADAVIST  GADOBUTROL  1 MMOL/ML IV SOLN COMPARISON:  None Available. FINDINGS: Segmentation:  Standard. Alignment: No substantial sagittal subluxation. Vertebrae: Right eccentric degenerative/discogenic endplate signal change at L4-L5. No specific evidence of acute fracture or discitis/osteomyelitis. No suspicious bone lesion. Conus medullaris and cauda equina: Conus extends to the L1-L2 level. Conus and cauda equina appear normal. Paraspinal and other soft tissues: Mild left hydronephrosis. Disc levels: T12-L1: No significant disc protrusion, foraminal stenosis, or canal stenosis. L1-L2: Left eccentric disc protrusion results in mild left foraminal stenosis. Patent canal and right foramen. L2-L3: Mild disc bulging. Mild facet arthropathy. No significant canal or foraminal stenosis L3-L4: Broad disc bulge with superimposed right subarticular disc protrusion which extends inferiorly. Bilateral facet arthropathy. Resulting moderate to severe right subarticular recess stenosis with potential impingement of descending right L4 nerve root. Mild to moderate canal stenosis. Mild right foraminal stenosis. L4-L5: Broad disc bulge with right subarticular disc protrusion. Bilateral facet arthropathy. Resulting moderate right subarticular recess stenosis with potential impingement of the descending right L5 nerve roots. Mild canal stenosis. Moderate right and mild-to-moderate left foraminal stenosis. L5-S1: Right eccentric disc bulge and bilateral facet arthropathy. Superiorly directed right subarticular disc protrusion. Patent canal. Moderate left foraminal stenosis. IMPRESSION: 1. At L3-L4, right subarticular disc protrusion results in moderate to severe right subarticular recess stenosis with potential  impingement of descending right L4 nerve root. Mild to moderate canal stenosis and mild right foraminal stenosis. 2. At L4-L5, right subarticular disc protrusion results in moderate right subarticular recess stenosis with potential impingement of descending right L5 nerve roots. Mild canal stenosis. Moderate right and mild-to-moderate left foraminal stenosis. 3. At L5-S1, moderate left foraminal stenosis. 4. Mild left hydronephrosis, better characterized on recent CT Electronically Signed   By: Gilmore GORMAN Molt M.D.   On: 03/27/2024 21:38   CT Renal Stone Study Result Date: 03/27/2024 CLINICAL DATA:  Abdominal and flank pain. EXAM: CT ABDOMEN AND PELVIS WITHOUT CONTRAST TECHNIQUE: Multidetector CT imaging of the abdomen and pelvis was performed following the standard protocol without IV contrast. RADIATION DOSE REDUCTION: This exam was performed according to the departmental dose-optimization program which includes automated exposure control, adjustment of the mA and/or kV according to patient size and/or use of iterative reconstruction technique. COMPARISON:  CT abdomen and pelvis 03/01/2024. FINDINGS: Lower chest: No acute abnormality. Hepatobiliary: No focal liver abnormality is seen. No gallstones, gallbladder wall thickening, or biliary dilatation. Pancreas: Unremarkable. No pancreatic ductal dilatation or surrounding inflammatory changes. Spleen: Normal in size without focal abnormality. Adrenals/Urinary Tract: Patient is status post right nephrectomy and cystectomy. Left-sided urostomy present. A left ureteral stent is present. The proximal portion of the stent is coiled in the proximal left ureter. There is mild to moderate left-sided hydronephrosis with contrast seen in the left renal collecting system. There is mild left perinephric fat stranding. No contrast is seen distal to this point. The adrenal glands are within normal limits. Stomach/Bowel: Stomach is within normal limits. Appendix appears  normal. No evidence of bowel wall thickening, distention, or inflammatory changes. Colonic diverticulosis is present. Vascular/Lymphatic: Aortic atherosclerosis. No enlarged abdominal or pelvic lymph nodes. Reproductive: Prostate gland is surgically absent. Other: There is a small amount of free fluid in the pelvis similar to the  prior study. There is no focal abdominal wall hernia. Musculoskeletal: Degenerative changes affect the spine. There severe degenerative changes of the right hip. IMPRESSION: 1. Left ureteral stent is coiled in the proximal left ureter. There is mild to moderate left-sided hydronephrosis with contrast seen in the left renal collecting system only. Obstructive process not excluded secondary to stent positioning. Recommend clinical correlation and follow-up. 2. Left-sided urostomy. 3. Status post right nephrectomy and cystectomy. 4. Stable small amount of free fluid in the pelvis. 5. Colonic diverticulosis. 6. Aortic atherosclerosis. Aortic Atherosclerosis (ICD10-I70.0). Electronically Signed   By: Greig Pique M.D.   On: 03/27/2024 19:09    Assessment & Plan:   Problem List Items Addressed This Visit     Long term current use of anticoagulant -Eliquis    On Eliquis       Relevant Orders   Ambulatory referral to Home Health   Bacteremia   Enterococcus faecalis bacteremia secondary to complicated urinary tract infection  - UCx showing Enterococcus with sensitivity to ampicillin .  Was placed on ceftriaxone  plus ampicillin .  Seen by infectious disease.  TEE negative.  Port removed 6/9.  Patient to transition to p.o. amoxicillin  upon discharge x 2 weeks, last day 6/20.  Patient will need follow-up with infectious disease in the outpatient setting.  Furl sent.  FTT: RN/PT/OT at home      Relevant Orders   Ambulatory referral to Home Health   Complicated UTI (urinary tract infection)   -Status post cystoprostatectomy, total ureterectomy and right nephro ureterectomy and  solitary left kidney status post ureterostomy.  Oncology was following and in agreement and okay with Port-A-Cath removal as recommended per ID due to bacteremia.  Patient to follow-up with oncology in the outpatient setting.  Enterococcus faecalis bacteremia secondary to complicated urinary tract infection  - UCx showing Enterococcus with sensitivity to ampicillin .  Was placed on ceftriaxone  plus ampicillin .  Seen by infectious disease.  TEE negative.  Port removed 6/9.  Patient to transition to p.o. amoxicillin  upon discharge x 2 weeks, last day 6/20.  Patient will need follow-up with infectious disease in the outpatient setting.  Furl sent.      Relevant Orders   Ambulatory referral to Home Health   Low back pain - Primary   Severe lower back pain secondary to lumbar radiculopathy - Neurosurgery recommending IR initially for epidural steroid injection.  This is on hold secondary to infection below.  Received IV Decadron , Lyrica , muscle relaxer with marked improvement in pain.  Will transition patient over to Medrol  Dosepak upon discharge.  Will also give prescription for Robaxin , Lyrica .  Patient will need to follow-up in outpatient for lumbar injection. Oxycodone  prn  Potential benefits of a short or long term opioids use as well as potential risks (i.e. addiction risk, apnea etc) and complications (i.e. Somnolence, constipation and others) were explained to the patient and were aknowledged. Epidural is pending if cleared by ID  RN/PT/OT at home        Relevant Medications   oxyCODONE  (OXY IR/ROXICODONE ) 5 MG immediate release tablet   Other Relevant Orders   Ambulatory referral to Home Health      Meds ordered this encounter  Medications   oxyCODONE  (OXY IR/ROXICODONE ) 5 MG immediate release tablet    Sig: Take 1 tablet (5 mg total) by mouth every 6 (six) hours as needed for severe pain (pain score 7-10) (severe low back pain).    Dispense:  100 tablet    Refill:  0  Follow-up: Return in about 4 weeks (around 05/15/2024) for a follow-up visit.  Marolyn Noel, MD

## 2024-04-18 ENCOUNTER — Ambulatory Visit: Attending: Cardiovascular Disease

## 2024-04-18 DIAGNOSIS — Z01810 Encounter for preprocedural cardiovascular examination: Secondary | ICD-10-CM

## 2024-04-18 NOTE — Progress Notes (Signed)
 Virtual Visit via Telephone Note   Because of Robert [name] co-morbid illnesses, he is at least at moderate risk for complications without adequate follow up.  This format is felt to be most appropriate for this patient at this time.  Due to technical limitations with video connection Web designer), today's appointment will be conducted as an audio only telehealth visit, and Robert [name] verbally agreed to proceed in this manner.   All issues noted in this document were discussed and addressed.  No physical exam could be performed with this format.  Evaluation Performed:  Preoperative cardiovascular risk assessment _____________   Date:  04/18/2024   Patient ID:  Robert [name], Robert [name] 1951-04-09, MRN [id number] Patient Location:  Home Provider location:   Office  Primary Care Provider:  Garald Karlynn GAILS, MD Primary Cardiologist:  Aleene Passe, MD  Chief Complaint / Patient Profile  73 y.o. y/o male with a h/o paroxysmal atrial fibrillation on Eliquis , hyperlipidemia, DM type [name], hypertension, metastatic bladder cancer s/p cystoprostatectomy, total neurectomy and right nephro ureterectomy and solitary left kidney s/p urostomy currently on immunotherapy for bladder cancer.  Who is pending lumbar EPI and presents today for telephonic preoperative cardiovascular risk assessment. History of Present Illness  Robert [name] is a 73 y.o. male who presents via audio/video conferencing for a telehealth visit today.  Pt was last seen in cardiology clinic on 10/03/2023 by Dr. Passe.  At that time Robert [name] was doing well.  The patient is now pending procedure as outlined above. Since his last visit, he has undergone treatment for metastatic bladder cancer.  He is s/p cystoscopy prostatectomy, total neurectomy and right nephroureterectomy currently on immunotherapy. Patient was hospitalized in 03/2024 for severe lower back pain secondary to lumbar  radiculopathy, found to have Enterococcus facilities bacteremia secondary to complicated urinary tract infection, now followed by infectious disease.  Patient's port was removed on 04/01/2024, per notes last day of oral antibiotics was 6/20.  Cardiology was consulted, patient underwent TEE which showed no evidence of SBE.  Patient was discharged on 04/03/2024. Today patient reports that he is doing very well overall.  He notes his biggest concern is back pain and is eager to undergo his procedure next week. He denies chest pain, shortness of breath, lower extremity edema, fatigue, palpitations, melena, hematuria, hemoptysis, diaphoresis, weakness, presyncope, syncope, orthopnea, and PND.  He is able to achieve greater than 4 METS of activity. Past Medical History    Past Medical History:  Diagnosis Date   Anticoagulant long-term use    eliquis --- managed by cardiology   Arthritis    Bladder cancer [facility])    urologist--- dr nieves;  overlapping   Cataract    History of basal cell carcinoma (BCC) excision    History of COVID-19 05/2019   per pt mild symptoms that resolved   HLD (hyperlipidemia)    HTN (hypertension)    Hx of colonic polyps    Insulin  dependent type 2 diabetes mellitus [facility])    endocrinologist--- dr trixie --  (03-11-2022 per pt check blood sugar 1-2 times daily,  fasting sugar-- 118-120s)   Nocturia    Paroxysmal atrial fibrillation (HCC) 03/12/2019   cardiologist--- dr calhoun   Renal disorder    Ureterocele    Use of cane as ambulatory aid    Past Surgical History:  Procedure Laterality Date   BLEPHAROPLASTY Bilateral    per pt approx  2001;   upper eyelid's  COLONOSCOPY     last one approx 2023   CYSTOSCOPY W/ RETROGRADES N/A 12/08/2023   Procedure: CYSTOSCOPY;  Surgeon: Alvaro Ricardo KATHEE Mickey., MD;  Location: [facility];  Service: Urology;  Laterality: N/A;  390 MINUTES NEEDED FOR CASE   CYSTOSCOPY WITH URETEROSCOPY AND STENT PLACEMENT Right 12/02/2022   Procedure:  CYSTOSCOPY WITH RIGHT URETEROSCOPY, RIGHT DIAGNOSTIC URETEROSCOPY;  Surgeon: Nieves Cough, MD;  Location: [facility];  Service: Urology;  Laterality: Right;  90 MINS FOR CASE   CYSTOSCOPY/RETROGRADE/URETEROSCOPY Bilateral 05/05/2023   Procedure: CYSTOSCOPY BILATERAL RETROGRADE PYELOGRAM RIGHT URETEROSCOPY RIGHT STENT PLACEMENT;  Surgeon: Nieves Cough, MD;  Location: [facility];  Service: Urology;  Laterality: Bilateral;  90 MINS FOR CASE   IR IMAGING GUIDED PORT INSERTION  05/30/2023   IR REMOVAL TUN ACCESS W/ PORT W/O FL MOD SED  03/29/2024   PATELLAR TENDON REPAIR Left 04/16/2004   @[facility]    ROBOT ASSITED LAPAROSCOPIC NEPHROURETERECTOMY Right 12/08/2023   Procedure: RIGHT ROBOT ASSISTED Nephroureterectomy;  Surgeon: Alvaro Ricardo KATHEE Mickey., MD;  Location: [facility];  Service: Urology;  Laterality: Right;   ROBOT LAP RADICAL CYSTOPROSTATECTOMY PELVIC LYMPHADENECTOMY, NEOBLADDER  12/08/2023   Procedure: ROBOT ASSISTED LAPAROSCOPIC RADICAL CYSTOPROSTATECTOMY;  Surgeon: Alvaro Ricardo KATHEE Mickey., MD;  Location: [facility];  Service: Urology;;   TOTAL URETHRECTOMY,RADICAL N/A 12/08/2023   Procedure: TOTAL URETHRECTOMY,RADICAL;  Surgeon: Alvaro Ricardo KATHEE Mickey., MD;  Location: [facility];  Service: Urology;  Laterality: N/A;   TRANSESOPHAGEAL ECHOCARDIOGRAM (CATH LAB) N/A 04/02/2024   Procedure: TRANSESOPHAGEAL ECHOCARDIOGRAM;  Surgeon: Delford Maude BROCKS, MD;  Location: [facility] INVASIVE CV LAB;  Service: Cardiovascular;  Laterality: N/A;   TRANSURETHRAL RESECTION OF BLADDER TUMOR N/A 06/18/2021   Procedure: TRANSURETHRAL RESECTION OF BLADDER TUMOR (TURBT) WITH RIGHT URETERAL STENT PLACEMENT, RIGHT URETEROSCOPY WITH DISTRACTION OF TUMOR, FULGURATION;  Surgeon: Nieves Cough, MD;  Location: [facility];  Service: Urology;  Laterality: N/A;   TRANSURETHRAL RESECTION OF BLADDER TUMOR N/A 03/15/2022   Procedure: TRANSURETHRAL RESECTION OF BLADDER TUMOR (TURBT) WITH BILATERAL URETEROSCOPY/URETHRAL/  DILATION/BILATERAL RETROGRADE PYELOGRAM/BIOPSY AND FULGURATION OF RIGHT URETERAL CANCER/RIGHT STENT PLACEMENT;  Surgeon: Nieves Cough, MD;  Location: [facility];  Service: Urology;  Laterality: N/A;   TRANSURETHRAL RESECTION OF BLADDER TUMOR N/A 05/05/2023   Procedure: TRANSURETHRAL RESECTION OF BLADDER TUMOR (TURBT) BLADDER BIOSPY;  Surgeon: Nieves Cough, MD;  Location: [facility];  Service: Urology;  Laterality: N/A;   TRANSURETHRAL RESECTION OF BLADDER TUMOR WITH MITOMYCIN -C N/A 12/02/2022   Procedure: TRANSURETHRAL RESECTION OF BLADDER TUMOR;  Surgeon: Nieves Cough, MD;  Location: [facility];  Service: Urology;  Laterality: N/A;   TRANSURETHRAL RESECTION OF PROSTATE  05/05/2023   Procedure: TRANSURETHRAL RESECTION OF THE PROSTATE (TURP);  Surgeon: Nieves Cough, MD;  Location: [facility];  Service: Urology;;    Allergies  Allergies  Allergen Reactions   Lisinopril  Swelling and Other (See Comments)    Tongue and throat became swollen    Home Medications    Prior to Admission medications   Medication Sig Start Date End Date Taking? Authorizing Provider  amoxicillin -clavulanate (AUGMENTIN ) 875-125 MG tablet Take 1 tablet by mouth 2 (two) times daily. Patient not taking: Reported on 04/17/2024 04/07/24   Arlon Carliss ORN, DO  apixaban  (ELIQUIS ) 5 MG TABS tablet Take 1 tablet (5 mg total) by mouth 2 (two) times daily. 12/21/23   Nahser, Aleene PARAS, MD  atorvastatin  (LIPITOR) 40 MG tablet Take 1 tablet (40 mg total) by mouth at bedtime. 02/07/24  Cheryle Page, MD  B Complex-C (B-COMPLEX WITH VITAMIN C) tablet Take 1 tablet by mouth 3 (three) times a week.    [provider]  Cholecalciferol (VITAMIN [name]-3 PO) Take 1 tablet by mouth 3 (three) times a week.    [provider]  EPINEPHrine  0.3 mg/0.3 mL IJ SOAJ injection Inject 0.3 mg into the muscle as needed for anaphylaxis. 09/14/23   Victor Lynwood DASEN, PA-C   furosemide (LASIX) 20 MG tablet Take 1-2 tablets (20-40 mg total) by mouth daily as needed. Patient taking differently: Take 20-40 mg by mouth daily as needed for edema. 04/16/24: Reports during TOC call, a doctor (unsure which doctor) told him he could take up to 3 pills at a time- reports taking between 1-3 pills prn accordingly; reports currently taking about every day 04/10/24   Plotnikov, Karlynn GAILS, MD  glucose blood test strip 1 each by Other route daily. Use daily for glucose control - Dx 250.00 04/21/20   Theophilus Andrews, Tully GRADE, MD  lidocaine  (LIDODERM ) 5 % Place 1 patch onto the skin daily. Remove & Discard patch within 12 hours or as directed by MD 04/03/24   Arlon Carliss ORN, DO  methocarbamol  (ROBAXIN ) 500 MG tablet Take 1 tablet (500 mg total) by mouth every 8 (eight) hours as needed for muscle spasms. 04/03/24   Arlon Carliss ORN, DO  metoprolol  succinate (TOPROL -XL) 25 MG 24 hr tablet Take 1 tablet (25 mg total) by mouth daily. 09/19/23   Nahser, Aleene PARAS, MD  oxyCODONE  (OXY IR/ROXICODONE ) 5 MG immediate release tablet Take 1 tablet (5 mg total) by mouth every 6 (six) hours as needed for severe pain (pain score 7-10) (severe low back pain). 04/17/24   Plotnikov, Karlynn GAILS, MD  pregabalin  (LYRICA ) 75 MG capsule Take 1 capsule (75 mg total) by mouth daily. 04/04/24   Arlon Carliss ORN, DO  propranolol  (INDERAL ) 10 MG tablet TAKE 1 TABLET BY MOUTH 4 TIMES DAILY AS NEEDED FOR PALPITATIONS, IRREGULAR OR FAST HEART RATE 10/25/22   Nahser, Aleene PARAS, MD  Semaglutide , 1 MG/DOSE, (OZEMPIC , 1 MG/DOSE,) 4 MG/3ML SOPN INJECT 1 MG UNDER THE SKIN ONCE A WEEK AS DIRECTED 07/20/23   Trixie File, MD   Physical Exam  Vital Signs:  Robert [name] does not have vital signs available for review today. Given telephonic nature of communication, physical exam is limited. AAOx3. NAD. Normal affect.  Speech and respirations are unlabored. Accessory Clinical Findings  None Assessment & Plan  1.   Preoperative Cardiovascular Risk Assessment: Robert [name] perioperative risk of a major cardiac event is 0.9% according to the Revised Cardiac Risk Index (RCRI).  Therefore, he is at low risk for perioperative complications.   His functional capacity is good at 5.07 METs according to the [place] Activity Status Index (DASI). Recommendations: According to ACC/AHA guidelines, no further cardiovascular testing needed.  The patient may proceed to surgery at acceptable risk.   Antiplatelet and/or Anticoagulation Recommendations: Per office protocol, patient can hold Eliquis  for 3 days prior to procedure.  Please resume when safe to do so from a bleeding standpoint.  The patient was advised that if he develops new symptoms prior to surgery to contact our office to arrange for a follow-up visit, and he verbalized understanding.  A copy of this note will be routed to requesting surgeon.  Time:   Today, I have spent 11 minutes with the patient with telehealth technology discussing medical history, symptoms, and management plan.    Robert [name]  Keira Bohlin, NP  04/18/2024, 1:50 PM

## 2024-04-22 ENCOUNTER — Telehealth: Payer: Self-pay

## 2024-04-22 NOTE — Discharge Instructions (Signed)
Post Procedure Spinal Discharge Instruction Sheet  You may resume a regular diet and any medications that you routinely take (including pain medications) unless otherwise noted by MD.  No driving day of procedure.  Light activity throughout the rest of the day.  Do not do any strenuous work, exercise, bending or lifting.  The day following the procedure, you can resume normal physical activity but you should refrain from exercising or physical therapy for at least three days thereafter.  You may apply ice to the injection site, 20 minutes on, 20 minutes off, as needed. Do not apply ice directly to skin.    Common Side Effects:  Headaches- take your usual medications as directed by your physician.  Increase your fluid intake.  Caffeinated beverages may be helpful.  Lie flat in bed until your headache resolves.  Restlessness or inability to sleep- you may have trouble sleeping for the next few days.  Ask your referring physician if you need any medication for sleep.  Facial flushing or redness- should subside within a few days.  Increased pain- a temporary increase in pain a day or two following your procedure is not unusual.  Take your pain medication as prescribed by your referring physician.  Leg cramps  Please contact our office at [phone] for the following symptoms: Fever greater than 100 degrees. Headaches unresolved with medication after 2-3 days. Increased swelling, pain, or redness at injection site.   Thank you for visiting [facility] today.   YOU MAY RESUME YOUR ELIQUIS IN 24 HOURS.

## 2024-04-23 ENCOUNTER — Encounter: Payer: Self-pay | Admitting: Internal Medicine

## 2024-04-23 ENCOUNTER — Other Ambulatory Visit: Payer: Self-pay

## 2024-04-23 ENCOUNTER — Inpatient Hospital Stay
Admission: RE | Admit: 2024-04-23 | Discharge: 2024-04-23 | Disposition: A | Discharge status: Home / Self Care | Source: Ambulatory Visit | Attending: Student

## 2024-04-23 ENCOUNTER — Ambulatory Visit: Payer: Self-pay | Admitting: Internal Medicine

## 2024-04-23 VITALS — BP 114/69 | HR 87 | Temp 97.0°F | Ht 74.0 in | Wt 188.6 lb

## 2024-04-23 DIAGNOSIS — M5116 Intervertebral disc disorders with radiculopathy, lumbar region: Secondary | ICD-10-CM | POA: Diagnosis not present

## 2024-04-23 DIAGNOSIS — M48061 Spinal stenosis, lumbar region without neurogenic claudication: Secondary | ICD-10-CM | POA: Diagnosis not present

## 2024-04-23 DIAGNOSIS — M5416 Radiculopathy, lumbar region: Secondary | ICD-10-CM

## 2024-04-23 DIAGNOSIS — M4727 Other spondylosis with radiculopathy, lumbosacral region: Secondary | ICD-10-CM | POA: Diagnosis not present

## 2024-04-23 DIAGNOSIS — R7881 Bacteremia: Secondary | ICD-10-CM

## 2024-04-23 MED ORDER — METHYLPREDNISOLONE ACETATE 40 MG/ML INJ SUSP (RADIOLOG
80.0000 mg | Freq: Once | INTRAMUSCULAR | Status: AC
  Administered 2024-04-23: 80 mg via EPIDURAL

## 2024-04-23 MED ORDER — IOPAMIDOL (ISOVUE-M 200) INJECTION 41%
1.0000 mL | Freq: Once | INTRAMUSCULAR | Status: AC
  Administered 2024-04-23: 1 mL via EPIDURAL

## 2024-04-23 NOTE — Patient Instructions (Signed)
 Smoking cessation instruction/counseling given:  counseled patient on the dangers of tobacco use, advised patient to stop smoking, and reviewed strategies to maximize success

## 2024-04-23 NOTE — Progress Notes (Unsigned)
 Patient Active Problem List   Diagnosis Date Noted   Low back pain 04/17/2024   Lumbar radiculopathy 03/28/2024   Hyponatremia 03/28/2024   CKD (chronic kidney disease), stage III (HCC) 03/28/2024   H/O left nephrectomy 03/19/2024   Neuropathy 03/19/2024   AKI (acute kidney injury) (HCC) 03/01/2024   Serratia marcescens infection 03/01/2024   Chills 02/29/2024   Medication management 02/29/2024   Hydronephrosis of left kidney 02/05/2024   Complicated UTI (urinary tract infection) 02/05/2024   Bacteremia 02/04/2024   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  10/09/2023   Hypomagnesemia 07/03/2023   Port-A-Cath in place 06/09/2023   Elevated PSA measurement 02/28/2023   Preop examination 02/27/2023   Osteoarthritis of right hip 06/29/2022   Bladder cancer (HCC) 01/31/2022   Primary osteoarthritis of right hip 10/19/2021   Long term current use of anticoagulant -Eliquis  03/12/2019   Paroxysmal A-fib (HCC) 03/12/2019   Overweight (BMI 25.0-29.9) 01/03/2019   Type 2 diabetes mellitus without complication, without long-term current use of insulin  (HCC) 07/23/2018   BPH associated with nocturia 03/01/2017   Irregular heart rate 06/23/2011   Hx of colonic polyps    ERECTILE DYSFUNCTION 12/19/2008   SKIN CANCER, HX OF 12/19/2008   Hyperlipidemia 08/21/2007   Essential hypertension 08/21/2007    Patient's Medications  New Prescriptions   No medications on file  Previous Medications   AMOXICILLIN -CLAVULANATE (AUGMENTIN ) 875-125 MG TABLET    Take 1 tablet by mouth 2 (two) times daily.   APIXABAN  (ELIQUIS ) 5 MG TABS TABLET    Take 1 tablet (5 mg total) by mouth 2 (two) times daily.   ATORVASTATIN  (LIPITOR) 40 MG TABLET    Take 1 tablet (40 mg total) by mouth at bedtime.   B COMPLEX-C (B-COMPLEX WITH VITAMIN C) TABLET    Take 1 tablet by mouth 3 (three) times a week.   CHOLECALCIFEROL (VITAMIN D-3 PO)    Take 1 tablet by mouth 3 (three) times a week.   EPINEPHRINE  0.3 MG/0.3 ML IJ SOAJ  INJECTION    Inject 0.3 mg into the muscle as needed for anaphylaxis.   FUROSEMIDE  (LASIX ) 20 MG TABLET    Take 1-2 tablets (20-40 mg total) by mouth daily as needed.   GLUCOSE BLOOD TEST STRIP    1 each by Other route daily. Use daily for glucose control - Dx 250.00   LIDOCAINE  (LIDODERM ) 5 %    Place 1 patch onto the skin daily. Remove & Discard patch within 12 hours or as directed by MD   METHOCARBAMOL  (ROBAXIN ) 500 MG TABLET    Take 1 tablet (500 mg total) by mouth every 8 (eight) hours as needed for muscle spasms.   METOPROLOL  SUCCINATE (TOPROL -XL) 25 MG 24 HR TABLET    Take 1 tablet (25 mg total) by mouth daily.   OXYCODONE  (OXY IR/ROXICODONE ) 5 MG IMMEDIATE RELEASE TABLET    Take 1 tablet (5 mg total) by mouth every 6 (six) hours as needed for severe pain (pain score 7-10) (severe low back pain).   PREGABALIN  (LYRICA ) 75 MG CAPSULE    Take 1 capsule (75 mg total) by mouth daily.   PROPRANOLOL  (INDERAL ) 10 MG TABLET    TAKE 1 TABLET BY MOUTH 4 TIMES DAILY AS NEEDED FOR PALPITATIONS, IRREGULAR OR FAST HEART RATE   SEMAGLUTIDE , 1 MG/DOSE, (OZEMPIC , 1 MG/DOSE,) 4 MG/3ML SOPN    INJECT 1 MG UNDER THE SKIN ONCE A WEEK AS DIRECTED  Modified Medications   No medications  on file  Discontinued Medications   No medications on file    Subjective: 73 year old male with past medical history of bladder cancer status post chemotherapy, cystoscopy prostatectomy, right nephroureterectomy,total urethrectomy now on nivolumab  every 4 weeks, admission in April for Serratia marcessens UTI/bacteremia treated with Cipro  and amoxicillin  x 2 weeks EOT 4/27, recent admission 5 9-5 10 for AKI underwent stent placement presents for hospital follow-up of E faecalis bacteremia secondary to UTI.  Patient had presented with worsening back pain x 1 day.  MRI lumbar spine showed no concern for infection.  Urine and blood cultures grew E faecalis.  Blood culture 2/2 grew E faecalis.  CT renal study showed stent in left ureter  mild to moderate hydronephrosis.  Urine was obtained from ostomy.  TTE and TEE without vegetation.  Patient will discharge with amoxicillin  x 2 weeks from port removal.  EOT 6/20.  Present today for follow-up and surveillance blood cultures. Seen by urology reecc oupatint f/u. Today no new symptoms.  Review of Systems: Review of Systems  All other systems reviewed and are negative.   Past Medical History:  Diagnosis Date   Anticoagulant long-term use    eliquis --- managed by cardiology   Arthritis    Bladder cancer [facility])    urologist--- dr nieves;  overlapping   Cataract    History of basal cell carcinoma (BCC) excision    History of COVID-19 05/2019   per pt mild symptoms that resolved   HLD (hyperlipidemia)    HTN (hypertension)    Hx of colonic polyps    Insulin  dependent type 2 diabetes mellitus [facility])    endocrinologist--- dr trixie --  (03-11-2022 per pt check blood sugar 1-2 times daily,  fasting sugar-- 118-120s)   Nocturia    Paroxysmal atrial fibrillation (HCC) 03/12/2019   cardiologist--- dr calhoun   Renal disorder    Ureterocele    Use of cane as ambulatory aid     Social History   Tobacco Use   Smoking status: Some Days    Current packs/day: 0.25    Average packs/day: 0.3 packs/day for 13.5 years (3.6 ttl pk-yrs)    Types: Cigars, Cigarettes    Start date: 2012    Last attempt to quit: 02/22/2011   Smokeless tobacco: Never  Vaping Use   Vaping status: Never Used  Substance Use Topics   Alcohol  use: Yes    Alcohol /week: 3.0 standard drinks of alcohol     Types: 3 Shots of liquor per week    Comment: 6-8oz /wk OCC MARTINI 2 TO 3 X WEEK   Drug use: Never    Family History  Problem Relation Age of Onset   Pancreatic cancer Mother 22   Cancer Paternal Uncle        unknown type cancer   Aortic aneurysm Other        family history   Cancer Other        Breast (1st degree relative)   Colon cancer Neg Hx    Rectal cancer Neg Hx    Stomach cancer Neg Hx     Esophageal cancer Neg Hx     Allergies  Allergen Reactions   Lisinopril  Swelling and Other (See Comments)    Tongue and throat became swollen    Health Maintenance  Topic Date Due   Hepatitis C Screening  Never done   DTaP/Tdap/Td (2 - Tdap) 08/20/2017   OPHTHALMOLOGY EXAM  12/25/2022   COVID-19 Vaccine (4 - 2024-25 season) 06/25/2023  INFLUENZA VACCINE  05/24/2024   FOOT EXAM  06/11/2024   HEMOGLOBIN A1C  08/14/2024   Diabetic kidney evaluation - Urine ACR  02/12/2025   Medicare Annual Wellness (AWV)  04/08/2025   Diabetic kidney evaluation - eGFR measurement  04/11/2025   Colonoscopy  06/18/2027   Pneumococcal Vaccine: 50+ Years  Completed   Hepatitis B Vaccines  Aged Out   HPV VACCINES  Aged Out   Meningococcal B Vaccine  Aged Out   Zoster Vaccines- Shingrix  Discontinued    Objective:  There were no vitals filed for this visit. There is no height or weight on file to calculate BMI.  Physical Exam Constitutional:      General: He is not in acute distress.    Appearance: He is normal weight. He is not toxic-appearing.  HENT:     Head: Normocephalic and atraumatic.     Right Ear: External ear normal.     Left Ear: External ear normal.     Nose: No congestion or rhinorrhea.     Mouth/Throat:     Mouth: Mucous membranes are moist.     Pharynx: Oropharynx is clear.  Eyes:     Extraocular Movements: Extraocular movements intact.     Conjunctiva/sclera: Conjunctivae normal.     Pupils: Pupils are equal, round, and reactive to light.  Cardiovascular:     Rate and Rhythm: Normal rate and regular rhythm.     Heart sounds: No murmur heard.    No friction rub. No gallop.  Pulmonary:     Effort: Pulmonary effort is normal.     Breath sounds: Normal breath sounds.  Abdominal:     General: Abdomen is flat. Bowel sounds are normal.     Palpations: Abdomen is soft.  Musculoskeletal:        General: No swelling. Normal range of motion.     Cervical back: Normal  range of motion and neck supple.  Skin:    General: Skin is warm and dry.  Neurological:     General: No focal deficit present.     Mental Status: He is oriented to person, place, and time.  Psychiatric:        Mood and Affect: Mood normal.    Physical Exam   Lab Results Lab Results  Component Value Date   WBC 16.2 (H) 04/11/2024   HGB 10.9 (L) 04/11/2024   HCT 32.0 (L) 04/11/2024   MCV 81.2 04/11/2024   PLT 232 04/11/2024    Lab Results  Component Value Date   CREATININE 1.31 (H) 04/11/2024   BUN 27 (H) 04/11/2024   NA 129 (L) 04/11/2024   K 4.4 04/11/2024   CL 95 (L) 04/11/2024   CO2 27 04/11/2024    Lab Results  Component Value Date   ALT 17 04/11/2024   AST 11 (L) 04/11/2024   ALKPHOS 84 04/11/2024   BILITOT 0.5 04/11/2024    Lab Results  Component Value Date   CHOL 116 02/27/2023   HDL 38.60 (L) 02/27/2023   LDLCALC 47 02/27/2023   LDLDIRECT 77.0 07/07/2015   TRIG 150.0 (H) 02/27/2023   CHOLHDL 3 02/27/2023   No results found for: LABRPR, RPRTITER No results found for: HIV1RNAQUANT, HIV1RNAVL, CD4TABS   Problem List Items Addressed This Visit   None  Results   Assessment/Plan #E faecalis bacteremia secondary to UTI, patient has ostomy #Back pain #Bladder cancer, gets nivolumab  every 4 weeks via port Amoxicillin1gm tid x 2 weeks from [facility] removal(6/6) EOT 6/20  Completed abx Blood cx and labs today. He is doing better but  fatigue is slow to recover. F/U with ID PRN    Loney Stank, MD [facility] 04/23/2024, 2:59 PM  I have personally spent 41 minutes involved in face-to-face and non-face-to-face activities for this patient on the day of the visit. Professional time spent includes the following activities: Preparing to see the patient (review of tests), Obtaining and/or reviewing separately obtained history (admission/discharge record), Performing a medically appropriate  examination and/or evaluation , Ordering medications/tests/procedures, referring and communicating with other health care professionals, Documenting clinical information in the EMR, Independently interpreting results (not separately reported), Communicating results to the patient/family/caregiver, Counseling and educating the patient/family/caregiver and Care coordination (not separately reported).

## 2024-04-24 ENCOUNTER — Other Ambulatory Visit: Payer: Self-pay | Admitting: *Deleted
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
## 2024-04-28 ENCOUNTER — Encounter: Payer: Self-pay | Admitting: Hematology

## 2024-04-28 ENCOUNTER — Encounter: Payer: Self-pay | Admitting: Nurse Practitioner

## 2024-04-29 ENCOUNTER — Encounter: Payer: Self-pay | Admitting: Hematology

## 2024-04-29 LAB — CULTURE, BLOOD (SINGLE)
MICRO NUMBER:: 16647458
MICRO NUMBER:: 16647460
Result:: NO GROWTH
SPECIMEN QUALITY:: ADEQUATE

## 2024-04-29 LAB — COMPLETE METABOLIC PANEL WITHOUT GFR
AG Ratio: 1.4 (calc) (ref 1.0–2.5)
ALT: 14 U/L (ref 9–46)
AST: 19 U/L (ref 10–35)
Albumin: 3.7 g/dL (ref 3.6–5.1)
Alkaline phosphatase (APISO): 88 U/L (ref 35–144)
BUN: 24 mg/dL (ref 7–25)
CO2: 25 mmol/L (ref 20–32)
Calcium: 9 mg/dL (ref 8.6–10.3)
Chloride: 102 mmol/L (ref 98–110)
Creat: 1.26 mg/dL (ref 0.70–1.28)
Globulin: 2.6 g/dL (ref 1.9–3.7)
Glucose, Bld: 282 mg/dL — ABNORMAL HIGH (ref 65–99)
Potassium: 4.3 mmol/L (ref 3.5–5.3)
Sodium: 137 mmol/L (ref 135–146)
Total Bilirubin: 0.4 mg/dL (ref 0.2–1.2)
Total Protein: 6.3 g/dL (ref 6.1–8.1)

## 2024-04-29 LAB — CBC WITH DIFFERENTIAL/PLATELET
Absolute Lymphocytes: 1516 {cells}/uL (ref 850–3900)
Absolute Monocytes: 389 {cells}/uL (ref 200–950)
Basophils Absolute: 41 {cells}/uL (ref 0–200)
Basophils Relative: 0.7 %
Eosinophils Absolute: 30 {cells}/uL (ref 15–500)
Eosinophils Relative: 0.5 %
HCT: 35.4 % — ABNORMAL LOW (ref 38.5–50.0)
Hemoglobin: 11 g/dL — ABNORMAL LOW (ref 13.2–17.1)
MCH: 26.8 pg — ABNORMAL LOW (ref 27.0–33.0)
MCHC: 31.1 g/dL — ABNORMAL LOW (ref 32.0–36.0)
MCV: 86.3 fL (ref 80.0–100.0)
MPV: 11.7 fL (ref 7.5–12.5)
Monocytes Relative: 6.6 %
Neutro Abs: 3924 {cells}/uL (ref 1500–7800)
Neutrophils Relative %: 66.5 %
Platelets: 186 Thousand/uL (ref 140–400)
RBC: 4.1 Million/uL — ABNORMAL LOW (ref 4.20–5.80)
RDW: 14.4 % (ref 11.0–15.0)
Total Lymphocyte: 25.7 %
WBC: 5.9 Thousand/uL (ref 3.8–10.8)

## 2024-04-30 ENCOUNTER — Ambulatory Visit: Admitting: Physical Therapy

## 2024-05-02 ENCOUNTER — Other Ambulatory Visit: Payer: Self-pay | Admitting: *Deleted

## 2024-05-02 DIAGNOSIS — D23112 Other benign neoplasm of skin of right lower eyelid, including canthus: Secondary | ICD-10-CM | POA: Diagnosis not present

## 2024-05-02 DIAGNOSIS — R7881 Bacteremia: Secondary | ICD-10-CM

## 2024-05-02 DIAGNOSIS — H25013 Cortical age-related cataract, bilateral: Secondary | ICD-10-CM | POA: Diagnosis not present

## 2024-05-02 DIAGNOSIS — H43813 Vitreous degeneration, bilateral: Secondary | ICD-10-CM | POA: Diagnosis not present

## 2024-05-02 DIAGNOSIS — H5203 Hypermetropia, bilateral: Secondary | ICD-10-CM | POA: Diagnosis not present

## 2024-05-02 DIAGNOSIS — E119 Type 2 diabetes mellitus without complications: Secondary | ICD-10-CM | POA: Diagnosis not present

## 2024-05-02 DIAGNOSIS — M5416 Radiculopathy, lumbar region: Secondary | ICD-10-CM

## 2024-05-02 DIAGNOSIS — H52203 Unspecified astigmatism, bilateral: Secondary | ICD-10-CM | POA: Diagnosis not present

## 2024-05-02 DIAGNOSIS — H524 Presbyopia: Secondary | ICD-10-CM | POA: Diagnosis not present

## 2024-05-02 DIAGNOSIS — H2513 Age-related nuclear cataract, bilateral: Secondary | ICD-10-CM | POA: Diagnosis not present

## 2024-05-02 LAB — HM DIABETES EYE EXAM

## 2024-05-02 NOTE — Transitions of Care (Post Inpatient/ED Visit) (Signed)
 Transition of Care week # 5/ day # 28: TOC 30-day program case closure  Visit Note  05/02/2024  Name: Robert [name] MRN: [id number]          DOB: [birth year]  Situation: Patient enrolled in [facility] 30-day program. Visit completed with patient by telephone.   HIPAA identifiers x 2 verified  TOC 30--day outreach completed; patient has successfully met/ accomplished established goals for TOC 30-day program without hospital readmission and referral was sent for ongoing follow up with longitudinal RN CM   Background:  Recent hospital visit April 13-16, 2025 for fever/ chills- UTI bacteremia/ hydronephrosis with placement of (L) ureteral stent in setting of baseline bladder CA with urostomy since February 2025 Hospital readmission June 5-11, 2025 for severe back pain due to acute lumbar radiculpoathy; with complicated UTI/ bacteremia due to infected chemotherapy port: port removed during hospitalization Ongoing pain post- most recent hospital discharge Caregiver for spouse who has dementia: spouse has 24/7 care per patient report: caregivers also assist with his care as indicated, including providing transportation to his provider appointments VBCI LCSW team active/ established  Initial Transition Care Management Follow-up Telephone Call    Past Medical History:  Diagnosis Date   Anticoagulant long-term use    eliquis --- managed by cardiology   Arthritis    Bladder cancer [facility])    urologist--- dr nieves;  overlapping   Cataract    History of basal cell carcinoma (BCC) excision    History of COVID-19 05/2019   per pt mild symptoms that resolved   HLD (hyperlipidemia)    HTN (hypertension)    Hx of colonic polyps    Insulin  dependent type 2 diabetes mellitus [facility])    endocrinologist--- dr trixie --  (03-11-2022 per pt check blood sugar 1-2 times daily,  fasting sugar-- 118-120s)   Nocturia    Paroxysmal atrial fibrillation (HCC) 03/12/2019   cardiologist--- dr calhoun   Renal  disorder    Ureterocele    Use of cane as ambulatory aid    Assessment:  I am still a little better- about the same as when we talked last week; no better, no worse; still using the walker, still taking the medicine they gave me but it just doesn't take the pain completely away.  The swelling in my legs has completely gone away- haven't needed to take the fluid pills for quite awhile.  I have not heard back from the home health people from when I saw Dr. Garald on 04/17/24; they were here for my wife just yesterday- but I completely forgot to ask them about when they would be starting services for me.  I appreciate you trying to call them, I will listen out for them and call them myself tomorrow if I don't hear back later today;    Denies clinical concerns and sounds to be in no distress throughout [facility] 30-day program outreach call today  Patient Reported Symptoms:  Cognitive Cognitive Status: Alert and oriented to person, place, and time, Normal speech and language skills, Insightful and able to interpret abstract concepts      Neurological Neurological Review of Symptoms: Other: [place] Neurological Symptoms/Conditions [RPT]: Reports ongoing/ chronic/ unchanged spinal discomfort: confirmed continues using medications as prescribed; Care Coordination outreach to Home Health agency to verify PCP order for RN/ PT/ OT dated 04/17/24: left message requesting call back; care coordination outreach by phone and copied chart to longitudinal RN CM to update Neurological Management Strategies: Medication therapy, Routine screening, Coping strategies  HEENT  HEENT Symptoms Reported: No symptoms reported      Cardiovascular Cardiovascular Symptoms Reported: No symptoms reported Other Cardiovascular Symptoms: Today, patient reports my leg swelling has completely gone away and I am no longer needing to use the 'as needed' fluid pill: confirms he has this medication at home for prn use if needed Does patient  have uncontrolled Hypertension?: No Cardiovascular Management Strategies: Medication therapy, Routine screening, Diet modification, Coping strategies, Adequate rest, Fluid modification  Respiratory Respiratory Symptoms Reported: No symptoms reported Other Respiratory Symptoms: Denies shortness of breath/ cough and sounds to be in no respiratory distress throughout [facility] call Respiratory Management Strategies: Adequate rest, Routine screening  Endocrine Endocrine Symptoms Reported: No symptoms reported Is patient diabetic?: Yes Is patient checking blood sugars at home?: Yes List most recent blood sugar readings, include date and time of day: Reports occasionally monitoring blood sugars at home; states since I have been taking the steroids for my back, I know it's going to be higher than normal, so I have not been checking them as often as I should; he does not have any recent specific home values to provide to me today; encouraged him to resume blood sugar monitoring; he states he will think about it    Gastrointestinal Gastrointestinal Symptoms Reported: No symptoms reported Additional Gastrointestinal Details: Continues to report pretty good appetite; confirms normal/ regular bowel movements; denies constipation today Gastrointestinal Management Strategies: Coping strategies    Genitourinary Genitourinary Symptoms Reported: No symptoms reported Other Genitourinary Symptoms: Continues to report no problems at all with urostomy; continues to self-manage urostomy at home independently; reviewed recent urology provider office visit 04/30/24 with patient Genitourinary Management Strategies: Urostomy  Integumentary Integumentary Symptoms Reported: No symptoms reported Additional Integumentary Details: Reports skin around urostomy site looks good, no redness, no problems at all Skin Management Strategies: Routine screening, Coping strategies  Musculoskeletal Musculoskelatal Symptoms  Reviewed: Difficulty walking, Limited mobility Additional Musculoskeletal Details: confirmed currently requiring/ using assistive devices for ambulation - walker; as per notes: care coordination outreach to [facility] to facilitate initiation of referral from 04/17/24 PCP office visit for RN; PT/ OT Musculoskeletal Management Strategies: Routine screening, Medication therapy, Coping strategies, Medical device      Psychosocial           There were no vitals filed for this visit.  Medications Reviewed Today     Reviewed by Kennetta Pavlovic M, RN (Registered Nurse) on 05/02/24 at 1317  Med List Status: <None>   Medication Order Taking? Sig Documenting Provider Last Dose Status Informant  amoxicillin -clavulanate (AUGMENTIN ) 875-125 MG tablet [id number]  Take 1 tablet by mouth 2 (two) times daily.  Patient not taking: Reported on 05/02/2024   Arlon Carliss ORN, DO  Active   apixaban  (ELIQUIS ) 5 MG TABS tablet [id number] Yes Take 1 tablet (5 mg total) by mouth 2 (two) times daily. Nahser, Aleene PARAS, MD  Active Self  atorvastatin  (LIPITOR) 40 MG tablet [id number] Yes Take 1 tablet (40 mg total) by mouth at bedtime. Cheryle Page, MD  Active Self  B Complex-C (B-COMPLEX WITH VITAMIN C) tablet [id number] Yes Take 1 tablet by mouth 3 (three) times a week. [provider]  Active Self           Med Note ROLENE MNIMPHLZS, SUSAN A   Sun Feb 04, 2024  4:51 PM)    Cholecalciferol (VITAMIN D-3 PO) [id number] Yes Take 1 tablet by mouth 3 (three) times a week. [provider]  Active Self  Med Note ALLEGRA, [place] I   Wed Mar 27, 2024 10:04 PM)    EPINEPHrine  0.3 mg/0.3 mL IJ SOAJ injection [id number] Yes Inject 0.3 mg into the muscle as needed for anaphylaxis. Victor Lynwood DASEN, PA-C  Active Self  furosemide  (LASIX ) 20 MG tablet [id number] Yes Take 1-2 tablets (20-40 mg total) by mouth daily as needed. Plotnikov, Aleksei V, MD  Active   glucose blood test strip [id number] Yes 1 each  by Other route daily. Use daily for glucose control - Dx 250.00 Theophilus Andrews, Tully GRADE, MD  Active Self  lidocaine  (LIDODERM ) 5 % [id number] Yes Place 1 patch onto the skin daily. Remove & Discard patch within 12 hours or as directed by MD Arlon Carliss ORN, DO  Active   methocarbamol  (ROBAXIN ) 500 MG tablet [id number] Yes Take 1 tablet (500 mg total) by mouth every 8 (eight) hours as needed for muscle spasms. Arlon Carliss ORN, DO  Active   metoprolol  succinate (TOPROL -XL) 25 MG 24 hr tablet [id number] Yes Take 1 tablet (25 mg total) by mouth daily. Nahser, Aleene PARAS, MD  Active Self  oxyCODONE  (OXY IR/ROXICODONE ) 5 MG immediate release tablet [id number] Yes Take 1 tablet (5 mg total) by mouth every 6 (six) hours as needed for severe pain (pain score 7-10) (severe low back pain). Plotnikov, Aleksei V, MD  Active   pregabalin  (LYRICA ) 75 MG capsule [id number] Yes Take 1 capsule (75 mg total) by mouth daily. Arlon Carliss ORN, DO  Active   propranolol  (INDERAL ) 10 MG tablet [id number] Yes TAKE 1 TABLET BY MOUTH 4 TIMES DAILY AS NEEDED FOR PALPITATIONS, IRREGULAR OR FAST HEART RATE Nahser, Aleene PARAS, MD  Active Self           Med Note MARISA, NATHANEL SAILOR   Fri Mar 01, 2024  7:02 PM) I was unable to track any last fill of this. The patient stated he takes this intermittently.  Semaglutide , 1 MG/DOSE, (OZEMPIC , 1 MG/DOSE,) 4 MG/3ML SOPN [id number] Yes INJECT 1 MG UNDER THE SKIN ONCE A WEEK AS DIRECTED Trixie File, MD  Active Self           Med Note MARISA, NATHANEL SAILOR   Fri Mar 01, 2024  5:59 PM)    sodium chloride  flush (NS) 0.9 % injection 10 mL [id number]   Lanny Callander, MD  Active            Recommendation:   Home Health requests: as per care coordination outreach today to follow up on referral placed by PCP on 04/17/24 (verified per EHR); longitudinal RN CM made aware of same Occupational Therapy Physical therapy Skilled Nursing Continue Current Plan of Care  Follow Up Plan:   Referral to RN Case  Manager Closing From:  Transitions of Care Program  Pls call/ message for questions,  Jonnatan Hanners [name] Taivon Haroon, RN, BSN, CCRN Alumnus RN Care Manager  Transitions of Care  VBCI - [facility] [phone]: direct office

## 2024-05-02 NOTE — Patient Outreach (Signed)
 Complex Care Management   Visit Note  05/02/2024  Name:  Robert [name] MRN: [id number] DOB: [birth year]  Situation: Referral received for Complex Care Management related to caregiver stress I obtained verbal consent from Patient.  Visit completed with patient  on the phone  Background:   Past Medical History:  Diagnosis Date   Anticoagulant long-term use    eliquis --- managed by cardiology   Arthritis    Bladder cancer [facility])    urologist--- dr nieves;  overlapping   Cataract    History of basal cell carcinoma (BCC) excision    History of COVID-19 05/2019   per pt mild symptoms that resolved   HLD (hyperlipidemia)    HTN (hypertension)    Hx of colonic polyps    Insulin  dependent type 2 diabetes mellitus [facility])    endocrinologist--- dr trixie --  (03-11-2022 per pt check blood sugar 1-2 times daily,  fasting sugar-- 118-120s)   Nocturia    Paroxysmal atrial fibrillation (HCC) 03/12/2019   cardiologist--- dr calhoun   Renal disorder    Ureterocele    Use of cane as ambulatory aid     Assessment: Patient Reported Symptoms:  Cognitive Cognitive Status: No symptoms reported      Neurological Neurological Review of Symptoms: No symptoms reported Oher Neurological Symptoms/Conditions [RPT]: ongoing lumbar pain Neurological Management Strategies: Coping strategies Neurological Self-Management Outcome: 4 (good)  HEENT HEENT Symptoms Reported: No symptoms reported      Cardiovascular Cardiovascular Symptoms Reported: No symptoms reported    Respiratory Respiratory Symptoms Reported: No symptoms reported    Endocrine Endocrine Symptoms Reported: No symptoms reported Is patient diabetic?: Yes Is patient checking blood sugars at home?: Yes List most recent blood sugar readings, include date and time of day: Has not take blood sugar todaysugar has been high since taking meds Endocrine Self-Management Outcome: 4 (good)  Gastrointestinal Gastrointestinal Symptoms  Reported: No symptoms reported      Genitourinary Genitourinary Symptoms Reported: No symptoms reported Genitourinary Management Strategies: Urostomy Urostomy Stoma Size: small Urostomy Stoma Care: per patient, changes every 2 days Urostomy Supplies Obtained From: [employer] Needed: no Genitourinary Self-Management Outcome: 4 (good)  Integumentary Integumentary Symptoms Reported: No symptoms reported    Musculoskeletal Musculoskelatal Symptoms Reviewed: Difficulty walking Additional Musculoskeletal Details: uses walker, recent hospitalization for lumbar radiculopathy Musculoskeletal Management Strategies: Medication therapy, Routine screening Falls in the past year?: No Number of falls in past year: 1 or less Was there an injury with Fall?: No Fall Risk Category Calculator: 0 Patient Fall Risk Level: Low Fall Risk Patient at Risk for Falls Due to: Impaired balance/gait, Impaired mobility Fall risk Follow up: Falls prevention discussed  Psychosocial Psychosocial Symptoms Reported: Depression - if selected complete PHQ 2-9 Other Psychosocial Conditions: Patient remains caregiver for spouse with dementia, has 24/7 care for patient -confirmed that neighbors also assists with meals and regular check in Behavioral Management Strategies: Adequate rest, Support system Behavioral Health Comment: patient agreeable to ongoing therapy Behaviors When Feeling Stressed/Fearful: rest, prayers, postive self talk, watches baseball and hx channel Techniques to Cope with Loss/Stress/Change: Diversional activities Quality of Family Relationships: supportive Do you feel physically threatened by others?: No      04/23/2024    3:03 PM  Depression screen PHQ 2/9  Decreased Interest 0  Down, Depressed, Hopeless 0  PHQ - 2 Score 0  Altered sleeping 0  Tired, decreased energy 0  Change in appetite 0  Feeling bad or failure about yourself  0  Trouble concentrating 0   Moving slowly or fidgety/restless 0  Suicidal thoughts 0  PHQ-9 Score 0    There were no vitals filed for this visit.  Medications Reviewed Today   Medications were not reviewed in this encounter     Recommendation:   PCP Follow-up Specialty provider follow-up as scheduled  Follow Up Plan:   Telephone follow-up 05/13/24  Lenn Mean, LCSW [place]  [facility], [facility] Licensed Clinical Social Worker  Direct Dial: [phone]

## 2024-05-02 NOTE — Patient Instructions (Signed)
 Visit Information  Thank you for taking time to visit with me today. Please don't hesitate to contact me if I can be of assistance to you before our next scheduled appointment.  Your next care management appointment is by telephone on 05/13/24 at 3pm    Please call the care guide team at [phone] if you need to cancel, schedule, or reschedule an appointment.   Please call 911 if you are experiencing a Mental Health or Behavioral Health Crisis or need someone to talk to.  Malavika Lira, LCSW [place]  [facility], [facility] Licensed Clinical Social Worker  Direct Dial: [phone]

## 2024-05-02 NOTE — Patient Instructions (Signed)
 Visit Information  Thank you for taking time to visit with me today. Please don't hesitate to contact me if I can be of assistance to you before our next scheduled telephone appointment.  It has been a pleasure working with you over the last 30 days!  Great job managing your care after your hospital visit!  Please listen for a call from the scheduling care guide to schedule a phone call with the new nurse care manager who will pick up in your care where we are leaving off today  Following are the goals we discussed today:  Patient Self Care Activities:  Attend all scheduled provider appointments Call pharmacy for medication refills 3-7 days in advance of running out of medications Call provider office for new concerns or questions  Take medications as prescribed   Continue pacing activity to avoid and minimize pain  Continue to use assistive devices as needed to prevent falls- your walker If you believe your condition is getting worse- contact your care providers (doctors) promptly- reaching out to your doctor early when you have concerns can prevent you from having to go to the hospital Continue to monitor your blood pressures, weights, oxygen levels, and blood sugars at home and write them down on paper  If you are experiencing a Mental Health or Behavioral Health Crisis or need someone to talk to, please  call the Suicide and Crisis Lifeline: 988 call the [place]  National Suicide Prevention Lifeline: (445) 607-9458 or TTY: (838)257-9955 TTY (256) 466-5640) to talk to a trained counselor call 1-800-273-TALK (toll free, 24 hour hotline) go to [facility] [address] [place] [phone]) call the [facility] Crisis Line: [phone] call 911   Patient verbalizes understanding of instructions and care plan provided today and agrees to view in MyChart. Active MyChart status and patient understanding of how to access instructions and care plan via  MyChart confirmed with patient.     Jameer Storie [name] Tesneem Dufrane, RN, BSN, Media planner  Transitions of Care  [facility] - [facility] [phone]: direct office

## 2024-05-03 ENCOUNTER — Telehealth: Payer: Self-pay | Admitting: *Deleted

## 2024-05-03 NOTE — Progress Notes (Signed)
 Complex Care Management Note  Care Guide Note 05/03/2024 Name: Robert [name] MRN: [id number] DOB: 1951-10-17  Robert [name] is a 73 y.o. year old male who sees Plotnikov, Aleksei V, MD for primary care. I reached out to Robert [name] by phone today to offer complex care management services.  Robert [name] was given information about Complex Care Management services today including:   The Complex Care Management services include support from the care team which includes your Nurse Care Manager, Clinical Social Worker, or Pharmacist.  The Complex Care Management team is here to help remove barriers to the health concerns and goals most important to you. Complex Care Management services are voluntary, and the patient may decline or stop services at any time by request to their care team member.   Complex Care Management Consent Status: Patient agreed to services and verbal consent obtained.   Follow up plan:  Telephone appointment with complex care management team member scheduled for:  05/10/2024  Encounter Outcome:  Patient Scheduled  Thedford Franks, CMA [place]  [facility], [facility] Guide Direct Dial: [phone]  Fax: [phone] Website: [email or web address]

## 2024-05-07 ENCOUNTER — Encounter: Payer: Self-pay | Admitting: Internal Medicine

## 2024-05-07 DIAGNOSIS — C679 Malignant neoplasm of bladder, unspecified: Secondary | ICD-10-CM | POA: Diagnosis not present

## 2024-05-07 DIAGNOSIS — C775 Secondary and unspecified malignant neoplasm of intrapelvic lymph nodes: Secondary | ICD-10-CM | POA: Diagnosis not present

## 2024-05-07 DIAGNOSIS — C678 Malignant neoplasm of overlapping sites of bladder: Secondary | ICD-10-CM | POA: Diagnosis not present

## 2024-05-07 DIAGNOSIS — C61 Malignant neoplasm of prostate: Secondary | ICD-10-CM | POA: Diagnosis not present

## 2024-05-07 DIAGNOSIS — R590 Localized enlarged lymph nodes: Secondary | ICD-10-CM | POA: Diagnosis not present

## 2024-05-07 DIAGNOSIS — C661 Malignant neoplasm of right ureter: Secondary | ICD-10-CM | POA: Diagnosis not present

## 2024-05-09 ENCOUNTER — Inpatient Hospital Stay

## 2024-05-09 ENCOUNTER — Other Ambulatory Visit

## 2024-05-09 ENCOUNTER — Inpatient Hospital Stay (HOSPITAL_BASED_OUTPATIENT_CLINIC_OR_DEPARTMENT_OTHER): Admitting: Hematology

## 2024-05-09 VITALS — BP 120/60 | HR 82 | Temp 98.3°F | Resp 14 | Ht 74.0 in | Wt 179.3 lb

## 2024-05-09 DIAGNOSIS — Z8551 Personal history of malignant neoplasm of bladder: Secondary | ICD-10-CM | POA: Diagnosis not present

## 2024-05-09 DIAGNOSIS — Z79899 Other long term (current) drug therapy: Secondary | ICD-10-CM | POA: Diagnosis not present

## 2024-05-09 DIAGNOSIS — Z5112 Encounter for antineoplastic immunotherapy: Secondary | ICD-10-CM | POA: Insufficient documentation

## 2024-05-09 DIAGNOSIS — C679 Malignant neoplasm of bladder, unspecified: Secondary | ICD-10-CM | POA: Diagnosis not present

## 2024-05-09 DIAGNOSIS — Z7984 Long term (current) use of oral hypoglycemic drugs: Secondary | ICD-10-CM | POA: Diagnosis not present

## 2024-05-09 DIAGNOSIS — I10 Essential (primary) hypertension: Secondary | ICD-10-CM | POA: Diagnosis not present

## 2024-05-09 DIAGNOSIS — I480 Paroxysmal atrial fibrillation: Secondary | ICD-10-CM | POA: Diagnosis not present

## 2024-05-09 DIAGNOSIS — M4646 Discitis, unspecified, lumbar region: Secondary | ICD-10-CM | POA: Diagnosis present

## 2024-05-09 DIAGNOSIS — E1165 Type 2 diabetes mellitus with hyperglycemia: Secondary | ICD-10-CM | POA: Insufficient documentation

## 2024-05-09 DIAGNOSIS — C675 Malignant neoplasm of bladder neck: Secondary | ICD-10-CM

## 2024-05-09 DIAGNOSIS — D649 Anemia, unspecified: Secondary | ICD-10-CM | POA: Insufficient documentation

## 2024-05-09 DIAGNOSIS — M5126 Other intervertebral disc displacement, lumbar region: Secondary | ICD-10-CM | POA: Diagnosis not present

## 2024-05-09 DIAGNOSIS — M4640 Discitis, unspecified, site unspecified: Secondary | ICD-10-CM | POA: Diagnosis not present

## 2024-05-09 DIAGNOSIS — M4647 Discitis, unspecified, lumbosacral region: Secondary | ICD-10-CM | POA: Diagnosis not present

## 2024-05-09 DIAGNOSIS — E1122 Type 2 diabetes mellitus with diabetic chronic kidney disease: Secondary | ICD-10-CM | POA: Diagnosis present

## 2024-05-09 DIAGNOSIS — Z8616 Personal history of COVID-19: Secondary | ICD-10-CM | POA: Diagnosis not present

## 2024-05-09 DIAGNOSIS — Z794 Long term (current) use of insulin: Secondary | ICD-10-CM | POA: Diagnosis not present

## 2024-05-09 DIAGNOSIS — G8929 Other chronic pain: Secondary | ICD-10-CM | POA: Insufficient documentation

## 2024-05-09 DIAGNOSIS — E119 Type 2 diabetes mellitus without complications: Secondary | ICD-10-CM | POA: Diagnosis not present

## 2024-05-09 DIAGNOSIS — M4626 Osteomyelitis of vertebra, lumbar region: Secondary | ICD-10-CM | POA: Diagnosis present

## 2024-05-09 DIAGNOSIS — Z936 Other artificial openings of urinary tract status: Secondary | ICD-10-CM | POA: Diagnosis not present

## 2024-05-09 DIAGNOSIS — G061 Intraspinal abscess and granuloma: Secondary | ICD-10-CM | POA: Diagnosis present

## 2024-05-09 DIAGNOSIS — B952 Enterococcus as the cause of diseases classified elsewhere: Secondary | ICD-10-CM | POA: Diagnosis present

## 2024-05-09 DIAGNOSIS — M549 Dorsalgia, unspecified: Secondary | ICD-10-CM | POA: Insufficient documentation

## 2024-05-09 DIAGNOSIS — M47816 Spondylosis without myelopathy or radiculopathy, lumbar region: Secondary | ICD-10-CM | POA: Diagnosis not present

## 2024-05-09 DIAGNOSIS — E1169 Type 2 diabetes mellitus with other specified complication: Secondary | ICD-10-CM | POA: Diagnosis present

## 2024-05-09 DIAGNOSIS — B9689 Other specified bacterial agents as the cause of diseases classified elsewhere: Secondary | ICD-10-CM | POA: Diagnosis not present

## 2024-05-09 DIAGNOSIS — Z7985 Long-term (current) use of injectable non-insulin antidiabetic drugs: Secondary | ICD-10-CM | POA: Diagnosis not present

## 2024-05-09 DIAGNOSIS — Z888 Allergy status to other drugs, medicaments and biological substances status: Secondary | ICD-10-CM | POA: Diagnosis not present

## 2024-05-09 DIAGNOSIS — M4317 Spondylolisthesis, lumbosacral region: Secondary | ICD-10-CM | POA: Diagnosis not present

## 2024-05-09 DIAGNOSIS — E785 Hyperlipidemia, unspecified: Secondary | ICD-10-CM | POA: Diagnosis present

## 2024-05-09 DIAGNOSIS — M4627 Osteomyelitis of vertebra, lumbosacral region: Secondary | ICD-10-CM | POA: Diagnosis present

## 2024-05-09 DIAGNOSIS — I129 Hypertensive chronic kidney disease with stage 1 through stage 4 chronic kidney disease, or unspecified chronic kidney disease: Secondary | ICD-10-CM | POA: Diagnosis present

## 2024-05-09 DIAGNOSIS — R609 Edema, unspecified: Secondary | ICD-10-CM | POA: Diagnosis not present

## 2024-05-09 DIAGNOSIS — I4811 Longstanding persistent atrial fibrillation: Secondary | ICD-10-CM | POA: Diagnosis present

## 2024-05-09 DIAGNOSIS — D630 Anemia in neoplastic disease: Secondary | ICD-10-CM | POA: Diagnosis present

## 2024-05-09 DIAGNOSIS — N1831 Chronic kidney disease, stage 3a: Secondary | ICD-10-CM | POA: Diagnosis not present

## 2024-05-09 DIAGNOSIS — Z7901 Long term (current) use of anticoagulants: Secondary | ICD-10-CM | POA: Diagnosis not present

## 2024-05-09 DIAGNOSIS — R7881 Bacteremia: Secondary | ICD-10-CM | POA: Diagnosis not present

## 2024-05-09 DIAGNOSIS — N183 Chronic kidney disease, stage 3 unspecified: Secondary | ICD-10-CM | POA: Diagnosis present

## 2024-05-09 DIAGNOSIS — F17210 Nicotine dependence, cigarettes, uncomplicated: Secondary | ICD-10-CM | POA: Diagnosis present

## 2024-05-09 DIAGNOSIS — N390 Urinary tract infection, site not specified: Secondary | ICD-10-CM | POA: Diagnosis present

## 2024-05-09 LAB — CBC WITH DIFFERENTIAL (CANCER CENTER ONLY)
Abs Immature Granulocytes: 0.07 K/uL (ref 0.00–0.07)
Basophils Absolute: 0 K/uL (ref 0.0–0.1)
Basophils Relative: 0 %
Eosinophils Absolute: 0 K/uL (ref 0.0–0.5)
Eosinophils Relative: 0 %
HCT: 37.4 % — ABNORMAL LOW (ref 39.0–52.0)
Hemoglobin: 12.6 g/dL — ABNORMAL LOW (ref 13.0–17.0)
Immature Granulocytes: 1 %
Lymphocytes Relative: 22 %
Lymphs Abs: 2.1 K/uL (ref 0.7–4.0)
MCH: 27.1 pg (ref 26.0–34.0)
MCHC: 33.7 g/dL (ref 30.0–36.0)
MCV: 80.4 fL (ref 80.0–100.0)
Monocytes Absolute: 0.5 K/uL (ref 0.1–1.0)
Monocytes Relative: 5 %
Neutro Abs: 7.2 K/uL (ref 1.7–7.7)
Neutrophils Relative %: 72 %
Platelet Count: 248 K/uL (ref 150–400)
RBC: 4.65 MIL/uL (ref 4.22–5.81)
RDW: 15.1 % (ref 11.5–15.5)
WBC Count: 9.9 K/uL (ref 4.0–10.5)
nRBC: 0 % (ref 0.0–0.2)

## 2024-05-09 LAB — CMP (CANCER CENTER ONLY)
ALT: 13 U/L (ref 0–44)
AST: 13 U/L — ABNORMAL LOW (ref 15–41)
Albumin: 3.8 g/dL (ref 3.5–5.0)
Alkaline Phosphatase: 96 U/L (ref 38–126)
Anion gap: 7 (ref 5–15)
BUN: 19 mg/dL (ref 8–23)
CO2: 28 mmol/L (ref 22–32)
Calcium: 9.4 mg/dL (ref 8.9–10.3)
Chloride: 99 mmol/L (ref 98–111)
Creatinine: 1.24 mg/dL (ref 0.61–1.24)
GFR, Estimated: >60 mL/min (ref >60)
Glucose, Bld: 250 mg/dL — ABNORMAL HIGH (ref 70–99)
Potassium: 4.3 mmol/L (ref 3.5–5.1)
Sodium: 134 mmol/L — ABNORMAL LOW (ref 135–145)
Total Bilirubin: 0.4 mg/dL (ref 0.0–1.2)
Total Protein: 7.3 g/dL (ref 6.5–8.1)

## 2024-05-09 MED ORDER — NIVOLUMAB-HYALURONIDASE-NVHY 600-10000 MG-UT/5ML ~~LOC~~ SOLN
1200.0000 mg | Freq: Once | SUBCUTANEOUS | Status: AC
  Administered 2024-05-09: 1200 mg via SUBCUTANEOUS
  Filled 2024-05-09: qty 10

## 2024-05-09 NOTE — Progress Notes (Signed)
 [facility]   [phone] Fax:(336) [phone]   Clinic Follow up Note   Patient Care Team: Plotnikov, Karlynn GAILS, MD as PCP - General (Internal Medicine) Nahser, Aleene PARAS, MD as PCP - Cardiology (Cardiology) Liane Sharyne MATSU, [facility] (Inactive) as Pharmacist (Pharmacist) Trixie File, MD as Consulting Physician (Internal Medicine) Nieves Cough, MD as Consulting Physician (Urology) Avram Lupita BRAVO, MD as Consulting Physician (Gastroenterology) Patrcia Sharper, MD as Consulting Physician (Ophthalmology) Lanny Callander, MD as Attending Physician (Hematology and Oncology) Prentiss Heddy HERO, RN as [facility], [place], [place] as [facility] Care Management  Date of Service:  05/09/2024  CHIEF COMPLAINT: f/u of bladder cancer  CURRENT THERAPY:  Adjuvant Nivolumab  injections every 4 weeks  Oncology History   Bladder cancer [facility]) Bladder cancer (HCC) -cT2N0M0 stage II diagnosed in 04/2023 -He has had 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 -He is clinically doing well, asymptomatic.  Surgical resection was discussed with patient, but he would like to try nonsurgical options. -He started Keytruda  on 01/12/23, stopped in June 2024 due to disease progression. 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 -I recommended neoadjuvant chemotherapy with cisplatin  and gemcitabine  for 4 cycles, followed by total cystectomy and  prostatectomy. -He started chemotherapy on June 08, 2023, and completed on 08/17/2023, overall tolerated well  -He underwent right robotic right nephroureterectomy, robotic radical cystectomy with total urethrectomy, bilateral pelvic lymph node dissection, and left cutaneous ureterostomy by Dr. Alvaro on 12/08/2023.. surgical path showed multifocal high-grade urothelial carcinoma, right renal pelvis, ureter, bladder, and prostatic urethra, surgical margines negative, 1 positive node  -He started adjuvant Nivo on 02/15/2024 -CT 03/27/2024 showed no radiographic evidence of malignancy   Assessment & Plan Bladder cancer No current bladder issues. Awaiting recent CT scan results. No fever or infection post-port removal. - Continue immunotherapy injections every four weeks, next scheduled for August 14 - Monitor thyroid  function with TSH every few months due to potential impact of immunotherapy  Anemia Anemia likely secondary to infection. Hemoglobin improved to 12.6 g/dL from 9 g/dL, approaching normal levels.  Type 2 diabetes mellitus Type 2 diabetes with elevated blood glucose at 250 mg/dL, likely exacerbated by recent steroid use.  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 Pain management previously included steroid injections. Referral to orthopedic or neurosurgery specialist pending. Physical therapy evaluation scheduled for next week. - Discuss back pain management with primary care physician - Proceed with referral to orthopedic or neurosurgery specialist - Undergo physical therapy evaluation next week - Consider home-based physical therapy setup  Plan - He is tolerating nivolumab  injection well, lab reviewed, adequate for treatment, we will proceed today and continue every 4 weeks - He is scheduled to have surveillance CT scan and follow-up with Dr. Patrcia in a few weeks - Follow-up in 4 weeks - I encouraged him to  participate in physical therapy    SUMMARY OF ONCOLOGIC HISTORY: Oncology History Overview Note   Cancer Staging  Bladder cancer [facility]) Staging form: Urinary Bladder, AJCC 8th Edition - Clinical stage from 05/14/2021: Stage 0a (cTa, cN0, cM0) - Signed by Lanny Callander, MD on 01/02/2023 Stage prefix: Initial diagnosis WHO/ISUP grade (low/high): High Grade Histologic grading system: 2 grade system - Pathologic stage from 02/22/2022: Stage I (pT1, pN0, cM0) - Signed by Lanny Callander, MD on 01/02/2023 WHO/ISUP grade (low/high): High Grade Histologic grading system: 2 grade system     Bladder cancer (HCC)  05/14/2021 Cancer Staging   Staging form: Urinary Bladder, AJCC 8th Edition - Clinical stage from 05/14/2021: Stage 0a (cTa, cN0, cM0) - Signed by Lanny Callander, MD on 01/02/2023 Stage prefix: Initial diagnosis WHO/ISUP grade (low/high): High Grade Histologic grading system: 2 grade system   01/31/2022 Initial Diagnosis   Bladder cancer (HCC)   02/22/2022 Cancer Staging   Staging form: Urinary Bladder, AJCC 8th Edition - Pathologic stage from 02/22/2022: Stage I (pT1, pN0, cM0) - Signed by Lanny Callander, MD on 01/02/2023 WHO/ISUP grade (low/high): High Grade Histologic grading system: 2 grade system   01/12/2023 - 04/06/2023 Chemotherapy   Patient is on Treatment Plan : BLADDER Pembrolizumab  (200) q21d     05/05/2023 Pathology Results     FINAL MICROSCOPIC DIAGNOSIS:   A. BLADDER, RIGHT POSTERIOR, TURBT:  Noninvasive high grade papillary urothelial carcinoma  Muscularis propria (detrusor muscle) is not present   B. BLADDER NECK AND PROSTATIC URETHRA, BIOPSY:  High grade urothelial carcinoma  The carcinoma focally invades muscularis propria (detrusor muscle)     05/05/2023 Cancer Staging   Staging form: Urinary Bladder, AJCC 8th Edition - Clinical stage from 05/05/2023: Stage II (cT2, cN0, cM0) - Signed by Lanny Callander, MD on 06/15/2023 WHO/ISUP grade (low/high): High Grade Histologic grading system: 2  grade system   06/01/2023 Imaging   CT Chest, Abdomen, and Pelvis with contrast  IMPRESSION: 1. Similar irregular asymmetric wall thickening along the posterior urinary bladder with progressive irregular thickening of the right ureter extending to the level of the renal pelvis where there is now a masslike 16 x 12 mm soft tissue focus, suspicious for direct ureteral extension of disease into the renal pelvis. 2. Irregular asymmetric wall thickening along the posterior urinary bladder measuring 14 mm is similar prior. 3. Right double-J ureteral stent is in place with persistent perinephric/periureteric stranding without significant hydronephrosis. 4. No convincing evidence of metastatic disease in the chest, abdomen or pelvis 5. Pancolonic diverticulosis without findings of acute diverticulitis. 6. Aortic Atherosclerosis (ICD10-I70.0) and Emphysema (ICD10-J43.9).   06/09/2023 - 08/17/2023 Chemotherapy   Patient is on Treatment Plan : BLADDER Cisplatin  D1 + Gemcitabine  D1,8 q21d x 4 Cycles     02/15/2024 -  Chemotherapy   Patient is on Treatment Plan : BLADDER Nivolumab  (480) q28d        Discussed the use of AI scribe software for clinical note transcription with the patient, who gave verbal consent to proceed.  History of Present Illness Robert [name] is a 73 year old male with bladder cancer who presents for follow-up.  He received an injection during his last visit with no subsequent complications. The port was removed during his hospital stay without any fever or infection following the procedure. A CT scan was performed a few days ago as part of his routine four-month surveillance, with results to be discussed with his urologist next week.  He experiences intermittent back pain due to three bulging discs, diagnosed at [facility] emergency room. He received a steroid injection at [facility]  and is awaiting further evaluation by an orthopedic or neurosurgeon. He has  type 2 diabetes with recent blood sugar levels elevated at 250 mg/dL, possibly due to steroid use. Swelling in his feet and ankles resolved after discontinuing a nerve medication and elevating his feet.  He has limited physical activity due to pain, with difficulty standing and walking. He attempts short walks and stretching exercises, noting muscle loss in his legs. No fever, infection, joint pain, skin rash, or diarrhea.     All other systems were reviewed with the patient and are negative.  MEDICAL HISTORY:  Past Medical History:  Diagnosis Date   Anticoagulant long-term use    eliquis --- managed by cardiology   Arthritis    Bladder cancer [facility])    urologist--- dr nieves;  overlapping   Cataract    History of basal cell carcinoma (BCC) excision    History of COVID-19 05/2019   per pt mild symptoms that resolved   HLD (hyperlipidemia)    HTN (hypertension)    Hx of colonic polyps    Insulin  dependent type 2 diabetes mellitus [facility])    endocrinologist--- dr trixie --  (03-11-2022 per pt check blood sugar 1-2 times daily,  fasting sugar-- 118-120s)   Nocturia    Paroxysmal atrial fibrillation (HCC) 03/12/2019   cardiologist--- dr calhoun   Renal disorder    Ureterocele    Use of cane as ambulatory aid     SURGICAL HISTORY: Past Surgical History:  Procedure Laterality Date   BLEPHAROPLASTY Bilateral    per pt approx  2001;   upper eyelid's   COLONOSCOPY     last one approx 2023   CYSTOSCOPY W/ RETROGRADES N/A 12/08/2023   Procedure: CYSTOSCOPY;  Surgeon: Alvaro Ricardo KATHEE Mickey., MD;  Location: [facility];  Service: Urology;  Laterality: N/A;  390 MINUTES NEEDED FOR CASE   CYSTOSCOPY WITH URETEROSCOPY AND STENT PLACEMENT Right 12/02/2022   Procedure: CYSTOSCOPY WITH RIGHT URETEROSCOPY, RIGHT DIAGNOSTIC URETEROSCOPY;  Surgeon: nieves Cough, MD;  Location: [facility];  Service: Urology;  Laterality: Right;  90 MINS FOR CASE   CYSTOSCOPY/RETROGRADE/URETEROSCOPY Bilateral 05/05/2023    Procedure: CYSTOSCOPY BILATERAL RETROGRADE PYELOGRAM RIGHT URETEROSCOPY RIGHT STENT PLACEMENT;  Surgeon: nieves Cough, MD;  Location: [facility];  Service: Urology;  Laterality: Bilateral;  90 MINS FOR CASE   IR IMAGING GUIDED PORT INSERTION  05/30/2023   IR REMOVAL TUN ACCESS W/ PORT W/O FL MOD SED  03/29/2024   PATELLAR TENDON REPAIR Left 04/16/2004   @[facility]    ROBOT ASSITED LAPAROSCOPIC NEPHROURETERECTOMY Right 12/08/2023   Procedure: RIGHT ROBOT ASSISTED Nephroureterectomy;  Surgeon: Alvaro Ricardo KATHEE Mickey., MD;  Location: [facility];  Service: Urology;  Laterality: Right;   ROBOT LAP RADICAL CYSTOPROSTATECTOMY PELVIC LYMPHADENECTOMY, NEOBLADDER  12/08/2023   Procedure: ROBOT ASSISTED LAPAROSCOPIC RADICAL CYSTOPROSTATECTOMY;  Surgeon: Alvaro Ricardo KATHEE Mickey., MD;  Location: [facility];  Service: Urology;;   TOTAL URETHRECTOMY,RADICAL N/A 12/08/2023   Procedure: TOTAL URETHRECTOMY,RADICAL;  Surgeon: Alvaro Ricardo KATHEE Mickey., MD;  Location: [facility];  Service: Urology;  Laterality: N/A;   TRANSESOPHAGEAL ECHOCARDIOGRAM (CATH LAB) N/A 04/02/2024   Procedure: TRANSESOPHAGEAL ECHOCARDIOGRAM;  Surgeon: Delford Maude BROCKS, MD;  Location: [facility] INVASIVE CV LAB;  Service: Cardiovascular;  Laterality: N/A;   TRANSURETHRAL RESECTION OF BLADDER TUMOR N/A 06/18/2021   Procedure: TRANSURETHRAL RESECTION OF BLADDER TUMOR (TURBT) WITH RIGHT URETERAL STENT PLACEMENT, RIGHT URETEROSCOPY WITH DISTRACTION OF TUMOR, FULGURATION;  Surgeon: nieves Cough, MD;  Location: [facility];  Service: Urology;  Laterality: N/A;   TRANSURETHRAL RESECTION OF BLADDER TUMOR N/A 03/15/2022   Procedure: TRANSURETHRAL RESECTION OF BLADDER TUMOR (TURBT) WITH BILATERAL URETEROSCOPY/URETHRAL/ DILATION/BILATERAL RETROGRADE PYELOGRAM/BIOPSY AND FULGURATION OF RIGHT URETERAL CANCER/RIGHT STENT PLACEMENT;  Surgeon: Nieves Cough, MD;  Location: [facility];  Service: Urology;  Laterality: N/A;   TRANSURETHRAL  RESECTION OF BLADDER TUMOR N/A 05/05/2023   Procedure: TRANSURETHRAL RESECTION OF BLADDER TUMOR (TURBT) BLADDER BIOSPY;  Surgeon: Nieves Cough, MD;  Location: [facility];  Service: Urology;  Laterality: N/A;   TRANSURETHRAL RESECTION OF BLADDER TUMOR WITH MITOMYCIN -C N/A 12/02/2022   Procedure: TRANSURETHRAL RESECTION OF BLADDER TUMOR;  Surgeon: Nieves Cough, MD;  Location: [facility];  Service: Urology;  Laterality: N/A;   TRANSURETHRAL RESECTION OF PROSTATE  05/05/2023   Procedure: TRANSURETHRAL RESECTION OF THE PROSTATE (TURP);  Surgeon: Nieves Cough, MD;  [facility];  Service: Urology;;    I have reviewed the social history and family history with the patient and they are unchanged from previous note.  ALLERGIES:  is allergic to lisinopril .  MEDICATIONS:  Current Outpatient Medications  Medication Sig Dispense Refill   amoxicillin -clavulanate (AUGMENTIN ) 875-125 MG tablet Take 1 tablet by mouth 2 (two) times daily. (Patient not taking: Reported on 05/02/2024) 14 tablet 0   apixaban  (ELIQUIS ) 5 MG TABS tablet Take 1 tablet (5 mg total) by mouth 2 (two) times daily. 180 tablet 1   atorvastatin  (LIPITOR) 40 MG tablet Take 1 tablet (40 mg total) by mouth at bedtime.     B Complex-C (B-COMPLEX WITH VITAMIN C) tablet Take 1 tablet by mouth 3 (three) times a week.     Cholecalciferol (VITAMIN D-3 PO) Take 1 tablet by mouth 3 (three) times a week.     EPINEPHrine  0.3 mg/0.3 mL IJ SOAJ injection Inject 0.3 mg into the muscle as needed for anaphylaxis. 1 each 0   furosemide  (LASIX ) 20 MG tablet Take 1-2 tablets (20-40 mg total) by mouth daily as needed. 60 tablet 3   glucose blood test strip 1 each by Other route daily. Use daily for glucose control - Dx 250.00 100 each 0   lidocaine  (LIDODERM ) 5 % Place 1 patch onto the skin daily. Remove & Discard patch within 12 hours or as directed by MD 30 patch 0   methocarbamol  (ROBAXIN ) 500 MG tablet Take 1  tablet (500 mg total) by mouth every 8 (eight) hours as needed for muscle spasms. 60 tablet 0   metoprolol  succinate (TOPROL -XL) 25 MG 24 hr tablet Take 1 tablet (25 mg total) by mouth daily. 90 tablet 2   oxyCODONE  (OXY IR/ROXICODONE ) 5 MG immediate release tablet Take 1 tablet (5 mg total) by mouth every 6 (six) hours as needed for severe pain (pain score 7-10) (severe low back pain). 100 tablet 0   pregabalin  (LYRICA ) 75 MG capsule Take 1 capsule (75 mg total) by mouth daily. 30 capsule 0   propranolol  (INDERAL ) 10 MG tablet TAKE 1 TABLET BY MOUTH 4 TIMES DAILY AS NEEDED FOR PALPITATIONS, IRREGULAR OR FAST HEART RATE 270 tablet 1   Semaglutide , 1 MG/DOSE, (OZEMPIC , 1 MG/DOSE,) 4 MG/3ML SOPN INJECT 1 MG UNDER THE SKIN ONCE A WEEK AS DIRECTED 9 mL 3   No current facility-administered medications for this visit.   Facility-Administered Medications Ordered in Other Visits  Medication Dose Route Frequency Provider Last Rate Last Admin   sodium chloride  flush (NS) 0.9 % injection 10 mL  10 mL Intracatheter PRN Lanny Callander, MD  10 mL at 02/15/24 1353    PHYSICAL EXAMINATION: ECOG PERFORMANCE STATUS: 2 - Symptomatic, <50% confined to bed  Vitals:   05/09/24 1348  BP: 120/60  Pulse: 82  Resp: 14  Temp: 98.3 F (36.8 C)  SpO2: 98%   Wt Readings from Last 3 Encounters:  05/09/24 179 lb 4.8 oz (81.3 kg)  04/23/24 188 lb 9.6 oz (85.5 kg)  04/17/24 186 lb (84.4 kg)     GENERAL:alert, no distress and comfortable SKIN: skin color, texture, turgor are normal, no rashes or significant lesions EYES: normal, Conjunctiva are pink and non-injected, sclera clear NECK: supple, thyroid  normal size, non-tender, without nodularity LYMPH:  no palpable lymphadenopathy in the cervical, axillary  LUNGS: clear to auscultation and percussion with normal breathing effort HEART: regular rate & rhythm and no murmurs and no lower extremity edema ABDOMEN:abdomen soft, non-tender and normal bowel  sounds Musculoskeletal:no cyanosis of digits and no clubbing, (+) bilateral leg muscle loss  NEURO: alert & oriented x 3 with fluent speech, no focal motor/sensory deficits  Physical Exam    LABORATORY DATA:  I have reviewed the data as listed    Latest Ref Rng & Units 05/09/2024    1:27 PM 04/23/2024    3:23 PM 04/11/2024    1:02 PM  CBC  WBC 4.0 - 10.5 K/uL 9.9  5.9  16.2   Hemoglobin 13.0 - 17.0 g/dL 87.3  88.9  89.0   Hematocrit 39.0 - 52.0 % 37.4  35.4  32.0   Platelets 150 - 400 K/uL 248  186  232         Latest Ref Rng & Units 05/09/2024    1:27 PM 04/23/2024    3:23 PM 04/11/2024    1:02 PM  CMP  Glucose 70 - 99 mg/dL 749  717  704   BUN 8 - 23 mg/dL 19  24  27    Creatinine 0.61 - 1.24 mg/dL 8.75  8.73  8.68   Sodium 135 - 145 mmol/L 134  137  129   Potassium 3.5 - 5.1 mmol/L 4.3  4.3  4.4   Chloride 98 - 111 mmol/L 99  102  95   CO2 22 - 32 mmol/L 28  25  27    Calcium  8.9 - 10.3 mg/dL 9.4  9.0  9.1   Total Protein 6.5 - 8.1 g/dL 7.3  6.3  7.0   Total Bilirubin 0.0 - 1.2 mg/dL 0.4  0.4  0.5   Alkaline Phos 38 - 126 U/L 96   84   AST 15 - 41 U/L 13  19  11    ALT 0 - 44 U/L 13  14  17        RADIOGRAPHIC STUDIES: I have personally reviewed the radiological images as listed and agreed with the findings in the report. No results found.    Orders Placed This Encounter  Procedures   TSH    Standing Status:   Standing    Number of Occurrences:   10    Expiration Date:   05/09/2025   T4, free    Standing Status:   Standing    Number of Occurrences:   10    Expiration Date:   05/09/2025   CBC with Differential (Cancer Center Only)    Standing Status:   Future    Expected Date:   07/04/2024    Expiration Date:   07/04/2025   CMP (Cancer Center only)    Standing Status:   Future  Expected Date:   07/04/2024    Expiration Date:   07/04/2025   T4    Standing Status:   Future    Expected Date:   07/04/2024    Expiration Date:   07/04/2025   TSH    Standing Status:    Future    Expected Date:   07/04/2024    Expiration Date:   07/04/2025   All questions were answered. The patient knows to call the clinic with any problems, questions or concerns. No barriers to learning was detected. The total time spent in the appointment was 25 minutes, including review of chart and various tests results, discussions about plan of care and coordination of care plan     Onita Mattock, MD 05/09/2024

## 2024-05-09 NOTE — Assessment & Plan Note (Addendum)
 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 -He is clinically doing well, asymptomatic.  Surgical resection was discussed with patient, but he would like to try nonsurgical options. -He started Keytruda  on 01/12/23, stopped in June 2024 due to disease progression. 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 -I recommended neoadjuvant chemotherapy with cisplatin  and gemcitabine  for 4 cycles, followed by total cystectomy and prostatectomy. -He started chemotherapy on June 08, 2023, and completed on 08/17/2023, overall tolerated well  -He underwent right robotic right nephroureterectomy, robotic radical cystectomy with total urethrectomy, bilateral pelvic lymph node dissection, and left cutaneous ureterostomy by Dr. Alvaro on 12/08/2023.. surgical path showed multifocal high-grade urothelial carcinoma, right renal pelvis, ureter, bladder, and prostatic urethra, surgical margines negative, 1 positive node  -He started adjuvant Nivo on 02/15/2024 -CT 03/27/2024 showed no radiographic evidence of malignancy

## 2024-05-09 NOTE — Telephone Encounter (Signed)
 Copied from CRM [id number]. Topic: Referral - Request for Referral >> May 09, 2024 10:54 AM Burnard DEL wrote: Did the patient discuss referral with their provider in the last year? Yes (If No - schedule appointment) (If Yes - send message)  Appointment offered? Yes  Type of order/referral and detailed reason for visit: Spine  Preference of office, provider, location: [facility]  If referral order, have you been seen by this specialty before? No (If Yes, this issue or another issue? When? Where?  Can we respond through MyChart? Yes

## 2024-05-10 ENCOUNTER — Inpatient Hospital Stay (HOSPITAL_COMMUNITY)
Admission: EM | Admit: 2024-05-10 | Discharge: 2024-05-15 | DRG: 477 | Disposition: A | Discharge status: Home Health | Attending: Student | Admitting: Student

## 2024-05-10 ENCOUNTER — Encounter (HOSPITAL_COMMUNITY): Payer: Self-pay | Admitting: *Deleted

## 2024-05-10 ENCOUNTER — Emergency Department (HOSPITAL_COMMUNITY)

## 2024-05-10 ENCOUNTER — Other Ambulatory Visit: Payer: Self-pay

## 2024-05-10 ENCOUNTER — Telehealth: Payer: Self-pay | Admitting: Internal Medicine

## 2024-05-10 DIAGNOSIS — F17290 Nicotine dependence, other tobacco product, uncomplicated: Secondary | ICD-10-CM | POA: Diagnosis present

## 2024-05-10 DIAGNOSIS — E119 Type 2 diabetes mellitus without complications: Secondary | ICD-10-CM

## 2024-05-10 DIAGNOSIS — Z936 Other artificial openings of urinary tract status: Secondary | ICD-10-CM | POA: Diagnosis not present

## 2024-05-10 DIAGNOSIS — I480 Paroxysmal atrial fibrillation: Secondary | ICD-10-CM | POA: Diagnosis not present

## 2024-05-10 DIAGNOSIS — C679 Malignant neoplasm of bladder, unspecified: Secondary | ICD-10-CM | POA: Diagnosis present

## 2024-05-10 DIAGNOSIS — Z794 Long term (current) use of insulin: Secondary | ICD-10-CM

## 2024-05-10 DIAGNOSIS — M4646 Discitis, unspecified, lumbar region: Secondary | ICD-10-CM | POA: Diagnosis present

## 2024-05-10 DIAGNOSIS — Z79899 Other long term (current) drug therapy: Secondary | ICD-10-CM | POA: Diagnosis not present

## 2024-05-10 DIAGNOSIS — Z7901 Long term (current) use of anticoagulants: Secondary | ICD-10-CM | POA: Diagnosis not present

## 2024-05-10 DIAGNOSIS — B952 Enterococcus as the cause of diseases classified elsewhere: Secondary | ICD-10-CM | POA: Diagnosis present

## 2024-05-10 DIAGNOSIS — E1122 Type 2 diabetes mellitus with diabetic chronic kidney disease: Secondary | ICD-10-CM | POA: Diagnosis present

## 2024-05-10 DIAGNOSIS — M4647 Discitis, unspecified, lumbosacral region: Secondary | ICD-10-CM | POA: Diagnosis not present

## 2024-05-10 DIAGNOSIS — E1169 Type 2 diabetes mellitus with other specified complication: Secondary | ICD-10-CM | POA: Diagnosis present

## 2024-05-10 DIAGNOSIS — M4627 Osteomyelitis of vertebra, lumbosacral region: Secondary | ICD-10-CM | POA: Diagnosis present

## 2024-05-10 DIAGNOSIS — D630 Anemia in neoplastic disease: Secondary | ICD-10-CM | POA: Diagnosis present

## 2024-05-10 DIAGNOSIS — B9689 Other specified bacterial agents as the cause of diseases classified elsewhere: Secondary | ICD-10-CM | POA: Diagnosis not present

## 2024-05-10 DIAGNOSIS — I129 Hypertensive chronic kidney disease with stage 1 through stage 4 chronic kidney disease, or unspecified chronic kidney disease: Secondary | ICD-10-CM | POA: Diagnosis present

## 2024-05-10 DIAGNOSIS — Z888 Allergy status to other drugs, medicaments and biological substances status: Secondary | ICD-10-CM | POA: Diagnosis not present

## 2024-05-10 DIAGNOSIS — C675 Malignant neoplasm of bladder neck: Secondary | ICD-10-CM | POA: Diagnosis present

## 2024-05-10 DIAGNOSIS — K5900 Constipation, unspecified: Secondary | ICD-10-CM | POA: Diagnosis present

## 2024-05-10 DIAGNOSIS — N1831 Chronic kidney disease, stage 3a: Secondary | ICD-10-CM | POA: Diagnosis not present

## 2024-05-10 DIAGNOSIS — Z7984 Long term (current) use of oral hypoglycemic drugs: Secondary | ICD-10-CM | POA: Diagnosis not present

## 2024-05-10 DIAGNOSIS — R7881 Bacteremia: Secondary | ICD-10-CM | POA: Diagnosis not present

## 2024-05-10 DIAGNOSIS — M160 Bilateral primary osteoarthritis of hip: Secondary | ICD-10-CM | POA: Diagnosis present

## 2024-05-10 DIAGNOSIS — Z9079 Acquired absence of other genital organ(s): Secondary | ICD-10-CM

## 2024-05-10 DIAGNOSIS — N390 Urinary tract infection, site not specified: Secondary | ICD-10-CM | POA: Diagnosis present

## 2024-05-10 DIAGNOSIS — M4626 Osteomyelitis of vertebra, lumbar region: Principal | ICD-10-CM | POA: Diagnosis present

## 2024-05-10 DIAGNOSIS — I4811 Longstanding persistent atrial fibrillation: Secondary | ICD-10-CM | POA: Diagnosis present

## 2024-05-10 DIAGNOSIS — M4649 Discitis, unspecified, multiple sites in spine: Secondary | ICD-10-CM | POA: Diagnosis present

## 2024-05-10 DIAGNOSIS — Z905 Acquired absence of kidney: Secondary | ICD-10-CM

## 2024-05-10 DIAGNOSIS — F17210 Nicotine dependence, cigarettes, uncomplicated: Secondary | ICD-10-CM | POA: Diagnosis present

## 2024-05-10 DIAGNOSIS — N183 Chronic kidney disease, stage 3 unspecified: Secondary | ICD-10-CM | POA: Diagnosis present

## 2024-05-10 DIAGNOSIS — I10 Essential (primary) hypertension: Secondary | ICD-10-CM | POA: Diagnosis present

## 2024-05-10 DIAGNOSIS — Z8551 Personal history of malignant neoplasm of bladder: Secondary | ICD-10-CM | POA: Diagnosis not present

## 2024-05-10 DIAGNOSIS — M4640 Discitis, unspecified, site unspecified: Secondary | ICD-10-CM | POA: Diagnosis present

## 2024-05-10 DIAGNOSIS — E785 Hyperlipidemia, unspecified: Secondary | ICD-10-CM | POA: Diagnosis present

## 2024-05-10 DIAGNOSIS — Z87440 Personal history of urinary (tract) infections: Secondary | ICD-10-CM

## 2024-05-10 DIAGNOSIS — M4317 Spondylolisthesis, lumbosacral region: Secondary | ICD-10-CM | POA: Diagnosis not present

## 2024-05-10 DIAGNOSIS — Z7985 Long-term (current) use of injectable non-insulin antidiabetic drugs: Secondary | ICD-10-CM

## 2024-05-10 DIAGNOSIS — E1165 Type 2 diabetes mellitus with hyperglycemia: Secondary | ICD-10-CM | POA: Diagnosis present

## 2024-05-10 DIAGNOSIS — G061 Intraspinal abscess and granuloma: Secondary | ICD-10-CM | POA: Diagnosis present

## 2024-05-10 DIAGNOSIS — Z860100 Personal history of colon polyps, unspecified: Secondary | ICD-10-CM

## 2024-05-10 DIAGNOSIS — M47816 Spondylosis without myelopathy or radiculopathy, lumbar region: Secondary | ICD-10-CM | POA: Diagnosis not present

## 2024-05-10 DIAGNOSIS — G8929 Other chronic pain: Secondary | ICD-10-CM | POA: Diagnosis present

## 2024-05-10 DIAGNOSIS — Z8616 Personal history of COVID-19: Secondary | ICD-10-CM | POA: Diagnosis not present

## 2024-05-10 DIAGNOSIS — Z800 Family history of malignant neoplasm of digestive organs: Secondary | ICD-10-CM

## 2024-05-10 DIAGNOSIS — R609 Edema, unspecified: Secondary | ICD-10-CM | POA: Diagnosis not present

## 2024-05-10 DIAGNOSIS — Z85828 Personal history of other malignant neoplasm of skin: Secondary | ICD-10-CM

## 2024-05-10 DIAGNOSIS — Z9221 Personal history of antineoplastic chemotherapy: Secondary | ICD-10-CM

## 2024-05-10 DIAGNOSIS — M5126 Other intervertebral disc displacement, lumbar region: Secondary | ICD-10-CM | POA: Diagnosis not present

## 2024-05-10 LAB — CBC WITH DIFFERENTIAL/PLATELET
Abs Immature Granulocytes: 0.1 K/uL — ABNORMAL HIGH (ref 0.00–0.07)
Basophils Absolute: 0 K/uL (ref 0.0–0.1)
Basophils Relative: 0 %
Eosinophils Absolute: 0 K/uL (ref 0.0–0.5)
Eosinophils Relative: 0 %
HCT: 38.9 % — ABNORMAL LOW (ref 39.0–52.0)
Hemoglobin: 12.7 g/dL — ABNORMAL LOW (ref 13.0–17.0)
Immature Granulocytes: 1 %
Lymphocytes Relative: 24 %
Lymphs Abs: 2 K/uL (ref 0.7–4.0)
MCH: 27.1 pg (ref 26.0–34.0)
MCHC: 32.6 g/dL (ref 30.0–36.0)
MCV: 82.9 fL (ref 80.0–100.0)
Monocytes Absolute: 0.5 K/uL (ref 0.1–1.0)
Monocytes Relative: 6 %
Neutro Abs: 5.7 K/uL (ref 1.7–7.7)
Neutrophils Relative %: 69 %
Platelets: 245 K/uL (ref 150–400)
RBC: 4.69 MIL/uL (ref 4.22–5.81)
RDW: 15.2 % (ref 11.5–15.5)
WBC: 8.3 K/uL (ref 4.0–10.5)
nRBC: 0 % (ref 0.0–0.2)

## 2024-05-10 LAB — BASIC METABOLIC PANEL WITH GFR
Anion gap: 8 (ref 5–15)
BUN: 16 mg/dL (ref 8–23)
CO2: 28 mmol/L (ref 22–32)
Calcium: 9.4 mg/dL (ref 8.9–10.3)
Chloride: 98 mmol/L (ref 98–111)
Creatinine, Ser: 1.22 mg/dL (ref 0.61–1.24)
GFR, Estimated: >60 mL/min (ref >60)
Glucose, Bld: 225 mg/dL — ABNORMAL HIGH (ref 70–99)
Potassium: 4.8 mmol/L (ref 3.5–5.1)
Sodium: 134 mmol/L — ABNORMAL LOW (ref 135–145)

## 2024-05-10 LAB — SEDIMENTATION RATE: Sed Rate: 52 mm/h — ABNORMAL HIGH (ref 0–16)

## 2024-05-10 LAB — C-REACTIVE PROTEIN: CRP: 2.1 mg/dL — ABNORMAL HIGH (ref <1.0)

## 2024-05-10 MED ORDER — GADOBUTROL 1 MMOL/ML IV SOLN
10.0000 mL | Freq: Once | INTRAVENOUS | Status: AC | PRN
  Administered 2024-05-10: 10 mL via INTRAVENOUS

## 2024-05-10 NOTE — Patient Instructions (Signed)
 Visit Information  Thank you for taking time to visit with me today. Please don't hesitate to contact me if I can be of assistance to you before our next scheduled appointment.  Our next appointment is by telephone on 05/22/24 at 10:30 am Please call the care guide team at [phone] if you need to cancel or reschedule your appointment.   Following is a copy of your care plan:   Goals Addressed             This Visit's Progress    VBCI RN Care Plan       Problems:  Chronic Disease Management support and education needs related to Bladder CA(left ureterostomy)/UTI bacteremia Caregiver support: Coping strategies(caregiver for spouse who has dementia). LSCW engaged. Lumbar radiculopathy pain  Goal: Over the next 90 days the Patient will attend all scheduled medical appointments: LCSW as evidenced by patient report or review of chart        continue to work with RN Care Manager and/or Social Worker to address care management and care coordination needs related to bladder ca(left ureterostomy) as evidenced by adherence to care management team scheduled appointments     demonstrate Ongoing adherence to prescribed treatment plan for bladder CA(left ureterostomy) as evidenced by patient report or review of chart take all medications exactly as prescribed and will call provider for medication related questions as evidenced by patient report or review of chart     Interventions:   Evaluation of current treatment plan related to bladder cancer(left ureterostomy), caregiver support self-management and patient's adherence to plan as established by provider. Discussed plans with patient for ongoing care management follow up and provided patient with direct contact information for care management team Evaluation of current treatment plan related to bladder ca(left ureterostomy) and patient's adherence to plan as established by provider Advised patient to eat healthy, continue to keep blood sugars  in recommended ranges and discussed importance of balanced blood sugar in management of overall health. Reviewed medications with patient and discussed importance of taking as prescribed Reviewed scheduled/upcoming provider appointments including LCSW visit scheduled for 05/13/24. Discussed plans with patient for ongoing care management follow up and provided patient with direct contact information for care management team Advised patient to contact provider with any health questions or concerns. RNCM called [facility] regarding barriers to start of care of physical therapy(per review of chart outpatient rehab scheduled for 05/16/24)  Patient Self-Care Activities:  Attend all scheduled provider appointments Call provider office for new concerns or questions  Perform all self care activities independently  Take medications as prescribed   Work with the social worker to address care coordination needs and will continue to work with the clinical team to address health care and disease management related needs Check blood sugars as recommended by provider Contact provider to schedule appointment regarding back pain  Plan:  Telephone follow up appointment with care management team member scheduled for:  05/22/24 at 10:00 am      Please call the Suicide and Crisis Lifeline: 988 call the [place]  National Suicide Prevention Lifeline: (507)675-9227 or TTY: 902-167-3074 TTY 567-505-5465) to talk to a trained counselor if you are experiencing a Mental Health or Behavioral Health Crisis or need someone to talk to.  Patient verbalizes understanding of instructions and care plan provided today and agrees to view in MyChart. Active MyChart status and patient understanding of how to access instructions and care plan via MyChart confirmed with patient.     Heddy Shutter, RN, MSN,  BSN, CCM [place]  [facility], Population Health Case Manager Phone: [phone]

## 2024-05-10 NOTE — H&P (Signed)
 History and Physical    Patient: Robert [name] DOB: 1951/07/17 DOA: 05/10/2024 DOS: the patient was seen and examined on 05/10/2024 PCP: Plotnikov, Karlynn GAILS, MD  Patient coming from: Home  Chief Complaint: No chief complaint on file.  HPI: Robert [name] is a 73 y.o. male with medical history significant of paroxysmal atrial fibrillation on Eliquis , chronic kidney disease stage III, type 2 diabetes, essential hypertension, hyperlipidemia, recent bacteremia with Bacteroides beta-lactamase positive, Enterococcus faecalis on June 7 with similar bacteria in the urine who was on IV antibiotics and transition to oral antibiotics, patient previously was treated for bladder cancer with chemotherapy, cystoscopy and prostatectomy with right nephro ureterectomy and total urethrectomy who is on chemotherapy now with nivolumab  and has steroids have been persistent UTI with bacteremia in April treated with Cipro  and amoxicillin .  Patient will also have another admission in May with stent placement and had Enterococcus faecalis bacteremia at the time from UTI.  He presented in early July with back pain at which point MRI of the lumbar spine showed no concern for infection.  Again had the Enterococcus faecalis in urine as well as blood.  Patient was evaluated with TTE and TEE but no vegetation.  Was discharged on oral amoxicillin  for 2 weeks after port was removed.  He has surveillance blood cultures with infectious disease Dr. Dennise with no apparent infection.  Today however MRI of the lumbar spine showed evidence of discitis and osteomyelitis at L4-L5 and L5-S1 levels.  There is also soft tissue phlegmon at the level with involvement of the right psoas muscle.  Patient has been admitted for further evaluation and treatment.  review of Systems: As mentioned in the history of present illness. All other systems reviewed and are negative. Past Medical History:  Diagnosis Date    Anticoagulant long-term use    eliquis --- managed by cardiology   Arthritis    Bladder cancer [facility])    urologist--- dr nieves;  overlapping   Cataract    History of basal cell carcinoma (BCC) excision    History of COVID-19 05/2019   per pt mild symptoms that resolved   HLD (hyperlipidemia)    HTN (hypertension)    Hx of colonic polyps    Insulin  dependent type 2 diabetes mellitus [facility])    endocrinologist--- dr trixie --  (03-11-2022 per pt check blood sugar 1-2 times daily,  fasting sugar-- 118-120s)   Nocturia    Paroxysmal atrial fibrillation (HCC) 03/12/2019   cardiologist--- dr calhoun   Renal disorder    Ureterocele    Use of cane as ambulatory aid    Past Surgical History:  Procedure Laterality Date   BLEPHAROPLASTY Bilateral    per pt approx  2001;   upper eyelid's   COLONOSCOPY     last one approx 2023   CYSTOSCOPY W/ RETROGRADES N/A 12/08/2023   Procedure: CYSTOSCOPY;  Surgeon: Alvaro Ricardo KATHEE Mickey., MD;  Location: [facility];  Service: Urology;  Laterality: N/A;  390 MINUTES NEEDED FOR CASE   CYSTOSCOPY WITH URETEROSCOPY AND STENT PLACEMENT Right 12/02/2022   Procedure: CYSTOSCOPY WITH RIGHT URETEROSCOPY, RIGHT DIAGNOSTIC URETEROSCOPY;  Surgeon: nieves Cough, MD;  Location: [facility];  Service: Urology;  Laterality: Right;  90 MINS FOR CASE   CYSTOSCOPY/RETROGRADE/URETEROSCOPY Bilateral 05/05/2023   Procedure: CYSTOSCOPY BILATERAL RETROGRADE PYELOGRAM RIGHT URETEROSCOPY RIGHT STENT PLACEMENT;  Surgeon: nieves Cough, MD;  Location: [facility];  Service: Urology;  Laterality: Bilateral;  90 MINS FOR CASE  IR IMAGING GUIDED PORT INSERTION  05/30/2023   IR REMOVAL TUN ACCESS W/ PORT W/O FL MOD SED  03/29/2024   PATELLAR TENDON REPAIR Left 04/16/2004   @[facility]    ROBOT ASSITED LAPAROSCOPIC NEPHROURETERECTOMY Right 12/08/2023   Procedure: RIGHT ROBOT ASSISTED Nephroureterectomy;  Surgeon: Alvaro Ricardo KATHEE Mickey., MD;  Location: [facility];  Service: Urology;   Laterality: Right;   ROBOT LAP RADICAL CYSTOPROSTATECTOMY PELVIC LYMPHADENECTOMY, NEOBLADDER  12/08/2023   Procedure: ROBOT ASSISTED LAPAROSCOPIC RADICAL CYSTOPROSTATECTOMY;  Surgeon: Alvaro Ricardo KATHEE Mickey., MD;  Location: [facility];  Service: Urology;;   TOTAL URETHRECTOMY,RADICAL N/A 12/08/2023   Procedure: TOTAL URETHRECTOMY,RADICAL;  Surgeon: Alvaro Ricardo KATHEE Mickey., MD;  Location: [facility];  Service: Urology;  Laterality: N/A;   TRANSESOPHAGEAL ECHOCARDIOGRAM (CATH LAB) N/A 04/02/2024   Procedure: TRANSESOPHAGEAL ECHOCARDIOGRAM;  Surgeon: Delford Maude BROCKS, MD;  Location: [facility] INVASIVE CV LAB;  Service: Cardiovascular;  Laterality: N/A;   TRANSURETHRAL RESECTION OF BLADDER TUMOR N/A 06/18/2021   Procedure: TRANSURETHRAL RESECTION OF BLADDER TUMOR (TURBT) WITH RIGHT URETERAL STENT PLACEMENT, RIGHT URETEROSCOPY WITH DISTRACTION OF TUMOR, FULGURATION;  Surgeon: Nieves Cough, MD;  Location: [facility];  Service: Urology;  Laterality: N/A;   TRANSURETHRAL RESECTION OF BLADDER TUMOR N/A 03/15/2022   Procedure: TRANSURETHRAL RESECTION OF BLADDER TUMOR (TURBT) WITH BILATERAL URETEROSCOPY/URETHRAL/ DILATION/BILATERAL RETROGRADE PYELOGRAM/BIOPSY AND FULGURATION OF RIGHT URETERAL CANCER/RIGHT STENT PLACEMENT;  Surgeon: Nieves Cough, MD;  Location: [facility];  Service: Urology;  Laterality: N/A;   TRANSURETHRAL RESECTION OF BLADDER TUMOR N/A 05/05/2023   Procedure: TRANSURETHRAL RESECTION OF BLADDER TUMOR (TURBT) BLADDER BIOSPY;  Surgeon: Nieves Cough, MD;  Location[facility];  Service: Urology;  Laterality: N/A;   TRANSURETHRAL RESECTION OF BLADDER TUMOR WITH MITOMYCIN -C N/A 12/02/2022   Procedure: TRANSURETHRAL RESECTION OF BLADDER TUMOR;  Surgeon: Nieves Cough, MD;  Location: [facility];  Service: Urology;  Laterality: N/A;   TRANSURETHRAL RESECTION OF PROSTATE  05/05/2023   Procedure: TRANSURETHRAL RESECTION OF THE PROSTATE (TURP);  Surgeon: Nieves Cough, MD;  Location: [facility];  Service: Urology;;   Social History:  reports that he has been smoking cigars and cigarettes. He started smoking about 13 years ago. He has a 3.6 pack-year smoking history. He has never used smokeless tobacco. He reports current alcohol  use of about 3.0 standard drinks of alcohol  per week. He reports that he does not use drugs.  Allergies  Allergen Reactions   Lisinopril  Swelling and Other (See Comments)    Tongue and throat became swollen   Lyrica  [Pregabalin ] Swelling and Other (See Comments)    Patient noticed swelling of the ankles and feet when taking this, so he stopped using it.    Family History  Problem Relation Age of Onset   Pancreatic cancer Mother 72   Cancer Paternal Uncle        unknown type cancer   Aortic aneurysm Other        family history   Cancer Other        Breast (1st degree relative)   Colon cancer Neg Hx    Rectal cancer Neg Hx    Stomach cancer Neg Hx    Esophageal cancer Neg Hx     Prior to Admission medications   Medication Sig Start Date End Date Taking? Authorizing Provider  apixaban  (ELIQUIS ) 5 MG TABS tablet Take 1 tablet (5 mg total) by mouth 2 (two) times daily. 12/21/23  Yes Nahser, Aleene PARAS, MD  atorvastatin  (LIPITOR) 40 MG tablet Take  1 tablet (40 mg total) by mouth at bedtime. 02/07/24  Yes Cheryle Page, MD  B Complex-C (B-COMPLEX WITH VITAMIN C) tablet Take 1 tablet by mouth 3 (three) times a week.   Yes [provider]  Cholecalciferol (VITAMIN D-3 PO) Take 1 tablet by mouth 3 (three) times a week.   Yes [provider]  EPINEPHrine  0.3 mg/0.3 mL IJ SOAJ injection Inject 0.3 mg into the muscle as needed for anaphylaxis. 09/14/23  Yes Schuman, Lynwood T, PA-C  lidocaine  (LIDODERM ) 5 % Place 1 patch onto the skin daily. Remove & Discard patch within 12 hours or as directed by MD Patient taking differently: Place 1 patch onto the skin daily as needed (for pain- Remove &  Discard patch within 12 hours or as directed by MD). 04/03/24  Yes Arlon, Carliss ORN, DO  methocarbamol  (ROBAXIN ) 500 MG tablet Take 1 tablet (500 mg total) by mouth every 8 (eight) hours as needed for muscle spasms. 04/03/24  Yes Arlon Carliss ORN, DO  metoprolol  succinate (TOPROL -XL) 25 MG 24 hr tablet Take 1 tablet (25 mg total) by mouth daily. Patient taking differently: Take 25 mg by mouth at bedtime. 09/19/23  Yes Nahser, Aleene PARAS, MD  oxyCODONE  (OXY IR/ROXICODONE ) 5 MG immediate release tablet Take 1 tablet (5 mg total) by mouth every 6 (six) hours as needed for severe pain (pain score 7-10) (severe low back pain). 04/17/24  Yes Plotnikov, Karlynn GAILS, MD  propranolol  (INDERAL ) 10 MG tablet TAKE 1 TABLET BY MOUTH 4 TIMES DAILY AS NEEDED FOR PALPITATIONS, IRREGULAR OR FAST HEART RATE Patient taking differently: Take 10 mg by mouth 4 (four) times daily as needed (for palpatations and/or an irregular or fast heart rate). 10/25/22  Yes Nahser, Aleene PARAS, MD  Semaglutide , 1 MG/DOSE, (OZEMPIC , 1 MG/DOSE,) 4 MG/3ML SOPN INJECT 1 MG UNDER THE SKIN ONCE A WEEK AS DIRECTED Patient taking differently: Inject 1 mg into the skin every Sunday. 07/20/23  Yes Trixie File, MD  TYLENOL  500 MG tablet Take 500-1,000 mg by mouth every 6 (six) hours as needed for mild pain (pain score 1-3) or headache.   Yes [provider]  amoxicillin -clavulanate (AUGMENTIN ) 875-125 MG tablet Take 1 tablet by mouth 2 (two) times daily. Patient not taking: Reported on 05/02/2024 04/07/24   Arlon Carliss ORN, DO  furosemide  (LASIX ) 20 MG tablet Take 1-2 tablets (20-40 mg total) by mouth daily as needed. Patient not taking: Reported on 05/10/2024 04/10/24   Plotnikov, Aleksei V, MD  glucose blood test strip 1 each by Other route daily. Use daily for glucose control - Dx 250.00 04/21/20   Theophilus Andrews, Tully GRADE, MD  pregabalin  (LYRICA ) 75 MG capsule Take 1 capsule (75 mg total) by mouth daily. Patient not taking: Reported on  05/10/2024 04/04/24   Arlon Carliss ORN, DO    Physical Exam: Vitals:   05/10/24 1357 05/10/24 1431 05/10/24 2045  BP: 133/64    Pulse: 73    Resp: 17    Temp:   98.3 F (36.8 C)  TempSrc:   Oral  SpO2: 100%    Weight:  86.2 kg   Height:  6' 3 (1.905 m)    Constitutional: Acutely ill looking, NAD, calm, comfortable Eyes: PERRL, lids and conjunctivae normal ENMT: Mucous membranes are moist. Posterior pharynx clear of any exudate or lesions.Normal dentition.  Neck: normal, supple, no masses, no thyromegaly Respiratory: clear to auscultation bilaterally, no wheezing, no crackles. Normal respiratory effort. No accessory muscle use.  Cardiovascular: Regular  rate and rhythm, no murmurs / rubs / gallops. No extremity edema. 2+ pedal pulses. No carotid bruits.  Abdomen: no tenderness, no masses palpated. No hepatosplenomegaly. Bowel sounds positive.  Musculoskeletal: Tenderness, some sign of sciatica, good range of motion, no joint swelling or tenderness, Skin: no rashes, lesions, ulcers. No induration Neurologic: CN 2-12 grossly intact. Sensation intact, DTR normal. Strength 5/5 in all 4.  Psychiatric: Normal judgment and insight. Alert and oriented x 3. Normal mood  Data Reviewed:  Afebrile, white count 8.3 with hemoglobin 12.7, sodium 134 glucose 225 the rest of the medical problem appears to be stable.  MRI of the lumbar spine shows findings consistent with discitis and osteomyelitis at the L4-L5 and L5-S1 levels.  There is also enhancement within the ventral epidural space at the L4-L5 disc level and L5 vertebral body level likely affect epidural phlegmon.  No well-defined epidural abscess present.  There is associated paraspinal soft tissue infection with phlegmon around L4-L5 with involvement of the right psoas muscle no appreciable right psoas abscess.  There is a caudally migrated right articular disc extrusion at the L3-L4 which has progressed.  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  Probably from recent bacteremia.  Patient will be admitted.  We will wait for IR to aspirate the phlegmon for culture sensitivity to determine antibiotic use.  Infectious disease to follow.  Patient also to have Eliquis  held to prepare patient for IR procedure.  #2 type 2 diabetes: Continue sliding scale insulin .  Continue to monitor.  #3 paroxysmal atrial fibrillation: Rate is controlled.  Will hold Eliquis   #4 chronic kidney disease stage III: Renal function is stable.  Continue to monitor  #5 essential hypertension: Blood pressure is controlled.  Continue to monitor  #6 hyperlipidemia: Continue statin  #7 history of bladder cancer: Continue with oncology follow-up    Advance Care Planning:   Code Status: Full Code   Consults: Infectious disease Dr. Dennise, interventional radiology  Family Communication: No family at bedside  Severity of Illness: The appropriate patient status for this patient is INPATIENT. Inpatient status is judged to be reasonable and necessary in order to provide the required intensity of service to ensure the patient's safety. The patient's presenting symptoms, physical exam findings, and initial radiographic and laboratory data in the context of their chronic comorbidities is felt to place them at high risk for further clinical deterioration. Furthermore, it is not anticipated that the patient will be medically stable for discharge from the hospital within 2 midnights of admission.   * I certify that at the point of admission it is my clinical judgment that the patient will require inpatient hospital care spanning beyond 2 midnights from the point of admission due to high intensity of service, high risk for further deterioration and high frequency of surveillance required.*  AuthorBETHA SIM KNOLL, MD 05/10/2024 10:26 PM  For on  call review [email or web address].

## 2024-05-10 NOTE — ED Notes (Signed)
 ED TO INPATIENT HANDOFF REPORT  ED Nurse Name and Phone #: Janeth RN [phone]  S Name/Age/Gender Robert [name] 73 y.o. male Room/Bed: H012C/H012C  Code Status   Code Status: Prior  Home/SNF/Other Home Patient oriented to: self, place, time, and situation Is this baseline? Yes   Triage Complete: Triage complete  Chief Complaint Diskitis [M46.40]  Triage Note Pt coming in from [facility] for an MRI.   Patient states he had a CT scan in follow up on Tues at [facility] and was call to come to ED for MRI states per patient they saw something on CT different from before, states he is having lower back pain.    Allergies Allergies  Allergen Reactions   Lisinopril  Swelling and Other (See Comments)    Tongue and throat became swollen   Lyrica  [Pregabalin ] Swelling and Other (See Comments)    Patient noticed swelling of the ankles and feet when taking this, so he stopped using it.    Level of Care/Admitting Diagnosis ED Disposition     ED Disposition  Admit   Condition  --   Comment  Hospital Area: [facility] [[id number]]  Level of Care: Med-Surg [16]  May admit patient to [facility] or [facility] if equivalent level of care is available:: Yes  Covid Evaluation: Asymptomatic - no recent exposure (last 10 days) testing not required  Diagnosis: Diskitis [805965]  Admitting Physician: SIM EMERY CROME [[id number]]  Attending Physician: SIM EMERY CROME [[id number]]  Certification:: I certify this patient will need inpatient services for at least 2 midnights  Expected Medical Readiness: 05/13/2024          B Medical/Surgery History Past Medical History:  Diagnosis Date   Anticoagulant long-term use    eliquis --- managed by cardiology   Arthritis    Bladder cancer [facility])    urologist--- dr nieves;  overlapping   Cataract    History of basal cell carcinoma (BCC) excision    History of COVID-19 05/2019   per pt mild symptoms that  resolved   HLD (hyperlipidemia)    HTN (hypertension)    Hx of colonic polyps    Insulin  dependent type 2 diabetes mellitus [facility])    endocrinologist--- dr trixie --  (03-11-2022 per pt check blood sugar 1-2 times daily,  fasting sugar-- 118-120s)   Nocturia    Paroxysmal atrial fibrillation (HCC) 03/12/2019   cardiologist--- dr calhoun   Renal disorder    Ureterocele    Use of cane as ambulatory aid    Past Surgical History:  Procedure Laterality Date   BLEPHAROPLASTY Bilateral    per pt approx  2001;   upper eyelid's   COLONOSCOPY     last one approx 2023   CYSTOSCOPY W/ RETROGRADES N/A 12/08/2023   Procedure: CYSTOSCOPY;  Surgeon: Alvaro Ricardo KATHEE Mickey., MD;  Location: [facility];  Service: Urology;  Laterality: N/A;  390 MINUTES NEEDED FOR CASE   CYSTOSCOPY WITH URETEROSCOPY AND STENT PLACEMENT Right 12/02/2022   Procedure: CYSTOSCOPY WITH RIGHT URETEROSCOPY, RIGHT DIAGNOSTIC URETEROSCOPY;  Surgeon: nieves Cough, MD;  Location: [facility];  Service: Urology;  Laterality: Right;  90 MINS FOR CASE   CYSTOSCOPY/RETROGRADE/URETEROSCOPY Bilateral 05/05/2023   Procedure: CYSTOSCOPY BILATERAL RETROGRADE PYELOGRAM RIGHT URETEROSCOPY RIGHT STENT PLACEMENT;  Surgeon: nieves Cough, MD;  Location: [facility];  Service: Urology;  Laterality: Bilateral;  90 MINS FOR CASE   IR IMAGING GUIDED PORT INSERTION  05/30/2023   IR REMOVAL TUN ACCESS W/  PORT W/O FL MOD SED  03/29/2024   PATELLAR TENDON REPAIR Left 04/16/2004   @[facility]    ROBOT ASSITED LAPAROSCOPIC NEPHROURETERECTOMY Right 12/08/2023   Procedure: RIGHT ROBOT ASSISTED Nephroureterectomy;  Surgeon: Alvaro Ricardo KATHEE Mickey., MD;  Location: [facility];  Service: Urology;  Laterality: Right;   ROBOT LAP RADICAL CYSTOPROSTATECTOMY PELVIC LYMPHADENECTOMY, NEOBLADDER  12/08/2023   Procedure: ROBOT ASSISTED LAPAROSCOPIC RADICAL CYSTOPROSTATECTOMY;  Surgeon: Alvaro Ricardo KATHEE Mickey., MD;  Location: [facility];  Service: Urology;;   TOTAL URETHRECTOMY,RADICAL  N/A 12/08/2023   Procedure: TOTAL URETHRECTOMY,RADICAL;  Surgeon: Alvaro Ricardo KATHEE Mickey., MD;  Location: [facility];  Service: Urology;  Laterality: N/A;   TRANSESOPHAGEAL ECHOCARDIOGRAM (CATH LAB) N/A 04/02/2024   Procedure: TRANSESOPHAGEAL ECHOCARDIOGRAM;  Surgeon: Delford Maude BROCKS, MD;  Location: [facility] INVASIVE CV LAB;  Service: Cardiovascular;  Laterality: N/A;   TRANSURETHRAL RESECTION OF BLADDER TUMOR N/A 06/18/2021   Procedure: TRANSURETHRAL RESECTION OF BLADDER TUMOR (TURBT) WITH RIGHT URETERAL STENT PLACEMENT, RIGHT URETEROSCOPY WITH DISTRACTION OF TUMOR, FULGURATION;  Surgeon: Nieves Cough, MD;  Location: [facility];  Service: Urology;  Laterality: N/A;   TRANSURETHRAL RESECTION OF BLADDER TUMOR N/A 03/15/2022   Procedure: TRANSURETHRAL RESECTION OF BLADDER TUMOR (TURBT) WITH BILATERAL URETEROSCOPY/URETHRAL/ DILATION/BILATERAL RETROGRADE PYELOGRAM/BIOPSY AND FULGURATION OF RIGHT URETERAL CANCER/RIGHT STENT PLACEMENT;  Surgeon: Nieves Cough, MD;  Location: [facility];  Service: Urology;  Laterality: N/A;   TRANSURETHRAL RESECTION OF BLADDER TUMOR N/A 05/05/2023   Procedure: TRANSURETHRAL RESECTION OF BLADDER TUMOR (TURBT) BLADDER BIOSPY;  Surgeon: Nieves Cough, MD;  Location[facility];  Service: Urology;  Laterality: N/A;   TRANSURETHRAL RESECTION OF BLADDER TUMOR WITH MITOMYCIN -C N/A 12/02/2022   Procedure: TRANSURETHRAL RESECTION OF BLADDER TUMOR;  Surgeon: Nieves Cough, MD;  Location: [facility];  Service: Urology;  Laterality: N/A;   TRANSURETHRAL RESECTION OF PROSTATE  05/05/2023   Procedure: TRANSURETHRAL RESECTION OF THE PROSTATE (TURP);  Surgeon: Nieves Cough, MD;  Location: [facility];  Service: Urology;;     A IV Location/Drains/Wounds Patient Lines/Drains/Airways Status     Active Line/Drains/Airways     Name Placement date Placement time Site Days   Peripheral IV 05/10/24 20 G Anterior;Right Forearm  05/10/24  1450  Forearm  less than 1   Peripheral IV 05/10/24 20 G Anterior;Left Forearm 05/10/24  1902  Forearm  less than 1   Urostomy LLQ 12/08/23  --  LLQ  154            Intake/Output Last 24 hours No intake or output data in the 24 hours ending 05/10/24 1937  Labs/Imaging Results for orders placed or performed during the hospital encounter of 05/10/24 (from the past 48 hours)  CBC with Differential     Status: Abnormal   Collection Time: 05/10/24  2:23 PM  Result Value Ref Range   WBC 8.3 4.0 - 10.5 K/uL   RBC 4.69 4.22 - 5.81 MIL/uL   Hemoglobin 12.7 (L) 13.0 - 17.0 g/dL   HCT 61.0 (L) 60.9 - 47.9 %   MCV 82.9 80.0 - 100.0 fL   MCH 27.1 26.0 - 34.0 pg   MCHC 32.6 30.0 - 36.0 g/dL   RDW 84.7 88.4 - 84.4 %   Platelets 245 150 - 400 K/uL   nRBC 0.0 0.0 - 0.2 %   Neutrophils Relative % 69 %   Neutro Abs 5.7 1.7 - 7.7 K/uL   Lymphocytes Relative 24 %   Lymphs Abs 2.0 0.7 - 4.0 K/uL   Monocytes  Relative 6 %   Monocytes Absolute 0.5 0.1 - 1.0 K/uL   Eosinophils Relative 0 %   Eosinophils Absolute 0.0 0.0 - 0.5 K/uL   Basophils Relative 0 %   Basophils Absolute 0.0 0.0 - 0.1 K/uL   Immature Granulocytes 1 %   Abs Immature Granulocytes 0.10 (H) 0.00 - 0.07 K/uL    Comment: Performed at [facility], [address], [place], [place] [address]  Basic metabolic panel     Status: Abnormal   Collection Time: 05/10/24  2:23 PM  Result Value Ref Range   Sodium 134 (L) 135 - 145 mmol/L   Potassium 4.8 3.5 - 5.1 mmol/L   Chloride 98 98 - 111 mmol/L   CO2 28 22 - 32 mmol/L   Glucose, Bld 225 (H) 70 - 99 mg/dL    Comment: Glucose reference range applies only to samples taken after fasting for at least 8 hours.   BUN 16 8 - 23 mg/dL   Creatinine, Ser 8.77 0.61 - 1.24 mg/dL   Calcium  9.4 8.9 - 10.3 mg/dL   GFR, Estimated >39 >39 mL/min    Comment: (NOTE) Calculated using the CKD-EPI Creatinine Equation (2021)    Anion gap 8 5 - 15    Comment: Performed at [facility], [address], [place], [place] [address]  Sedimentation rate     Status: Abnormal   Collection Time: 05/10/24  2:23 PM  Result Value Ref Range   Sed Rate 52 (H) 0 - 16 mm/hr    Comment: Performed at [facility], [address] [place], [place] [address]  C-reactive protein     Status: Abnormal   Collection Time: 05/10/24  2:23 PM  Result Value Ref Range   CRP 2.1 (H) <1.0 mg/dL    Comment: Performed at [facility], [address] [place], [place] [address]   MR Lumbar Spine W Wo Contrast Result Date: 05/10/2024 CLINICAL DATA:  Provided history: Low back pain, infection suspected, positive x-ray/CT. Concern for osteomyelitis/discitis (L4-L5). EXAM: MRI LUMBAR SPINE WITHOUT AND WITH CONTRAST TECHNIQUE: Multiplanar and multiecho pulse sequences of the lumbar spine were obtained without and with intravenous contrast. CONTRAST:  10mL GADAVIST  GADOBUTROL  1 MMOL/ML IV SOLN COMPARISON:  CT chest/abdomen/pelvis 05/07/2024. Lumbar spine MRI 03/27/2024. FINDINGS: Segmentation: 5 lumbar vertebrae. The caudal most well-formed tibial disc space is designated L5-S1. Alignment: Mild grade 1 retrolisthesis at L1-L2, L2-L3, L3-L4 and L4-L5. Mild grade 1 anterolisthesis at L5-S1. Vertebrae: New from the prior lumbar spine MRI 03/27/2024, there is prominent T1 hypointense and STIR hyperintense signal abnormality within the L4 and L5 vertebral bodies, centered along the L4-L5 disc space, with corresponding extensive pathologic enhancement. Signal abnormality and pathologic enhancement extends into the posterior elements on the left at L5. Additionally, there is new endplate irregularity and disc signal abnormality at L4-L5. This constellation of findings is consistent with sequela of discitis/osteomyelitis. To a lesser degree, there is new edema signal and enhancement within the S1 vertebral body and there is subtle abnormal enhancement along the ventral aspect of the L5-S1 disc. These findings  likely reflect early involvement of the L5-S1 disc and S1 vertebral body. Also new from the prior MRI, there is ill-defined abnormal enhancement within the ventral epidural space at the L4-L5 disc level and L5 vertebral body level, likely reflecting epidural phlegmon (series 11, image 10). No well-defined epidural abscess is present at this time. There is no more than mild spinal canal narrowing  at the L5 vertebral body level. Conus medullaris and cauda equina: Conus extends to the L1-L2 level. Signal abnormality identified within the visualized distal spinal cord. No pathologic enhancement of the cauda equina nerve roots. Paraspinal and other soft tissues: Abnormal enhancing soft tissue along the ventral and lateral aspects of the L4-L5 vertebrae (right greater than left) consistent with paraspinal soft tissue infection/phlegmon. Additionally, there is right psoas edema and enhancement consistent with right psoas involvement and myositis. No appreciable right psoas abscess. Disc levels: A caudally migrated right subarticular disc extrusion at L3-L4 has somewhat regressed. This contributes to moderate to severe right subarticular stenosis, slightly improved from the prior exam. Lumbar spondylosis has otherwise not significantly changed from the recent prior lumbar spine MRI of 03/27/2024. IMPRESSION: 1. Findings consistent with discitis/osteomyelitis at the L4-L5 and L5-S1 levels, as detailed within the body of the report. Abnormal enhancement also present within the ventral epidural space at the L4-L5 disc level, and L5 vertebral body level, likely reflecting epidural phlegmon. No well-defined epidural abscess is present at this time. 2. Associated paraspinal soft tissue infection/phlegmon centered at L4-L5 with involvement of the right psoas muscle. No appreciable right psoas abscess. 3. A caudally migrated right subarticular disc extrusion at L3-L4 has somewhat regressed. This contributes to moderate-to-severe  right subarticular stenosis, which has slightly improved. Lumbar spondylosis has otherwise not significantly changed from the recent prior lumbar spine MRI of 03/27/2024. Please refer to this prior examination for further description. Electronically Signed   By: Rockey Childs D.O.   On: 05/10/2024 17:53    Pending Labs Unresulted Labs (From admission, onward)     Start     Ordered   05/10/24 1806  Blood culture (routine x 2)  BLOOD CULTURE X 2,   R (with STAT occurrences)      05/10/24 1805            Vitals/Pain Today's Vitals   05/10/24 1357 05/10/24 1431 05/10/24 1936  BP: 133/64    Pulse: 73    Resp: 17    SpO2: 100%    Weight:  86.2 kg   Height:  6' 3 (1.905 m)   PainSc:  4  6     Isolation Precautions No active isolations  Medications Medications  gadobutrol  (GADAVIST ) 1 MMOL/ML injection 10 mL (10 mLs Intravenous Contrast Given 05/10/24 1658)    Mobility walks     Focused Assessments Neuro Assessment Handoff:  Swallow screen pass? Not ordered         Neuro Assessment: Within Defined Limits Neuro Checks:      Has TPA been given? No If patient is a Neuro Trauma and patient is going to OR before floor call report to 4N Charge nurse: [phone] or [phone]   R Recommendations: See Admitting Provider Note  Report given to:   Additional Notes:

## 2024-05-10 NOTE — Telephone Encounter (Signed)
 Copied from CRM [id number]. Topic: General - Other >> May 10, 2024 11:55 AM Gennette ORN wrote: Reason for CRM: Jenness [phone] calling from [facility] is calling because she needs to figure out his follow up and she has some concerns as well. Please contact her back.

## 2024-05-10 NOTE — ED Provider Triage Note (Signed)
 Emergency Medicine Provider Triage Evaluation Note  Robert [name] , a 73 y.o. male  was evaluated in triage.  Pt complains of abnormal CT.  Patient sent by urology.  He has had increasing lower back pain over the past 2 weeks.  He has required a walker to assist with ambulation starting at that time.  History of radical cystoprostatectomy/bladder tumor.  Patient has any fecal incontinence.  He has indwelling catheter.  History of paroxysmal A-fib on anticoagulation.  Patient sent for MRI.  Patient has results of recent CT at bedside.  Question of osteomyelitis/discitis in the L4-5 region.  Recommend MRI lumbar spine with and without contrast.  Review of Systems  Positive: Back pain Negative: Fever  Physical Exam  BP 133/64 (BP Location: Left Arm)   Pulse 73   Resp 17   SpO2 100%  Gen:   Awake, no distress   Resp:  Normal effort  MSK:   Moves extremities without difficulty  Other:  Ambulatory with walker, reports pain in the lower lumbar area, not really worsened with palpation  Medical Decision Making  Medically screening exam initiated at 2:24 PM.  Appropriate orders placed.  Robert [name] was informed that the remainder of the evaluation will be completed by another provider, this initial triage assessment does not replace that evaluation, and the importance of remaining in the ED until their evaluation is complete.     Desiderio Chew, PA-C 05/10/24 1426

## 2024-05-10 NOTE — Patient Outreach (Addendum)
 Complex Care Management   Visit Note  05/10/2024  Name:  Robert [name] MRN: [id number] DOB: [birth year]  Situation: Referral received for Complex Care Management related to bladder ca(left uretorostomy I obtained verbal consent from Patient.  Visit completed with patient  on the phone  Background:   Past Medical History:  Diagnosis Date   Anticoagulant long-term use    eliquis --- managed by cardiology   Arthritis    Bladder cancer [facility])    urologist--- dr nieves;  overlapping   Cataract    History of basal cell carcinoma (BCC) excision    History of COVID-19 05/2019   per pt mild symptoms that resolved   HLD (hyperlipidemia)    HTN (hypertension)    Hx of colonic polyps    Insulin  dependent type 2 diabetes mellitus [facility])    endocrinologist--- dr trixie --  (03-11-2022 per pt check blood sugar 1-2 times daily,  fasting sugar-- 118-120s)   Nocturia    Paroxysmal atrial fibrillation (HCC) 03/12/2019   cardiologist--- dr calhoun   Renal disorder    Ureterocele    Use of cane as ambulatory aid     Assessment: hospitalized 03/28/24-04/03/24 severe back pain, acute lumbar radiculopathy with complicated UTI/Bacteremia due to infected Chemo Port. Removed during hospitalization. Patient denies any signs/symptoms of infection at this time. Reports primary concern is back pain. He states he has just taken tylenol  and adds it helps the most. Patient Reported Symptoms:  Cognitive Cognitive Status: No symptoms reported, Insightful and able to interpret abstract concepts, Normal speech and language skills      Neurological Neurological Review of Symptoms: Other: [place] Neurological Symptoms/Conditions [RPT]: patient reports ongoing spinal discomfort due to bulging disc. Reports is scheduling appointment with provider for follow up. Patient reports tylenol  helps the most to relieve pain and states has just taken a dose of tylenol     HEENT HEENT Symptoms Reported: No symptoms reported       Cardiovascular Cardiovascular Symptoms Reported: No symptoms reported    Respiratory Respiratory Symptoms Reported: No symptoms reported    Endocrine Endocrine Symptoms Reported: No symptoms reported Is patient diabetic?: Yes Is patient checking blood sugars at home?: Yes List most recent blood sugar readings, include date and time of day: patient reports BS has not been checked today, but 158 yesterday morning. denies low blood sugar.    Gastrointestinal Gastrointestinal Symptoms Reported: No symptoms reported      Genitourinary Genitourinary Symptoms Reported: No symptoms reported Other Genitourinary Symptoms: patientn denies any difficulty with urostomy. reports changes at least every 2 days. denies any signs/symptoms of uti Genitourinary Management Strategies: Urostomy  Integumentary Integumentary Symptoms Reported: No symptoms reported    Musculoskeletal Musculoskelatal Symptoms Reviewed: Back pain, Difficulty walking Additional Musculoskeletal Details: reports increase in pain to back today and reports a 4-6/10 on the pain scale. He states he has taken tylenol  which he reports is the medication that usually helps the most and adds, it usually will be better Musculoskeletal Management Strategies: Activity, Medication therapy, Routine screening Musculoskeletal Self-Management Outcome: 3 (uncertain) Falls in the past year?: No    Psychosocial Other Psychosocial Conditions: taking care of spouse with dementia. LCSW involved for mental health needs            05/09/2024    2:35 PM  Depression screen PHQ 2/9  Decreased Interest 0  Down, Depressed, Hopeless 0  PHQ - 2 Score 0  Altered sleeping 0  Tired, decreased energy 0  Change in appetite 0  Feeling bad or failure about yourself  0  Trouble concentrating 0  Moving slowly or fidgety/restless 0  Suicidal thoughts 0  PHQ-9 Score 0    There were no vitals filed for this visit.  Medications Reviewed Today      Reviewed by Bryann Mcnealy M, RN (Registered Nurse) on 05/10/24 at 1034  Med List Status: <None>   Medication Order Taking? Sig Documenting Provider Last Dose Status Informant  acetaminophen  (TYLENOL ) 500 MG tablet [id number] Yes Take 500 mg by mouth every 6 (six) hours as needed. [provider]  Active   amoxicillin -clavulanate (AUGMENTIN ) 875-125 MG tablet [id number]  Take 1 tablet by mouth 2 (two) times daily.  Patient not taking: Reported on 05/02/2024   Arlon Carliss ORN, DO  Active   apixaban  (ELIQUIS ) 5 MG TABS tablet [id number] Yes Take 1 tablet (5 mg total) by mouth 2 (two) times daily. Nahser, Aleene PARAS, MD  Active Self  atorvastatin  (LIPITOR) 40 MG tablet [id number] Yes Take 1 tablet (40 mg total) by mouth at bedtime. Cheryle Page, MD  Active Self  B Complex-C (B-COMPLEX WITH VITAMIN C) tablet [id number] Yes Take 1 tablet by mouth 3 (three) times a week. [provider]  Active Self           Med Note ROLENE MNIMPHLZS, SUSAN A   Sun Feb 04, 2024  4:51 PM)    Cholecalciferol (VITAMIN D-3 PO) [id number] Yes Take 1 tablet by mouth 3 (three) times a week. [provider]  Active Self           Med Note ALLEGRA, [facility] I   Wed Mar 27, 2024 10:04 PM)    EPINEPHrine  0.3 mg/0.3 mL IJ SOAJ injection [id number] Yes Inject 0.3 mg into the muscle as needed for anaphylaxis. Victor Lynwood DASEN, PA-C  Active Self  furosemide  (LASIX ) 20 MG tablet [id number] Yes Take 1-2 tablets (20-40 mg total) by mouth daily as needed. Plotnikov, Aleksei V, MD  Active   glucose blood test strip [id number]  1 each by Other route daily. Use daily for glucose control - Dx 250.00 Theophilus Andrews, Tully GRADE, MD  Active Self  lidocaine  (LIDODERM ) 5 % [id number] Yes Place 1 patch onto the skin daily. Remove & Discard patch within 12 hours or as directed by MD Arlon Carliss ORN, DO  Active   methocarbamol  (ROBAXIN ) 500 MG tablet [id number] Yes Take 1 tablet (500 mg total) by mouth every 8 (eight) hours as  needed for muscle spasms. Arlon Carliss ORN, DO  Active   metoprolol  succinate (TOPROL -XL) 25 MG 24 hr tablet [id number] Yes Take 1 tablet (25 mg total) by mouth daily. Nahser, Aleene PARAS, MD  Active Self  oxyCODONE  (OXY IR/ROXICODONE ) 5 MG immediate release tablet [id number]  Take 1 tablet (5 mg total) by mouth every 6 (six) hours as needed for severe pain (pain score 7-10) (severe low back pain). Plotnikov, Aleksei V, MD  Active   pregabalin  (LYRICA ) 75 MG capsule [id number]  Take 1 capsule (75 mg total) by mouth daily.  Patient not taking: Reported on 05/10/2024   Arlon Carliss ORN, DO  Active   propranolol  (INDERAL ) 10 MG tablet [id number] Yes TAKE 1 TABLET BY MOUTH 4 TIMES DAILY AS NEEDED FOR PALPITATIONS, IRREGULAR OR FAST HEART RATE Nahser, Aleene PARAS, MD  Active Self           Med Note MARISA, NATHANEL SAILOR   Fri Mar 01, 2024  7:02 PM) I was unable to track any last fill  of this. The patient stated he takes this intermittently.  Semaglutide , 1 MG/DOSE, (OZEMPIC , 1 MG/DOSE,) 4 MG/3ML SOPN [id number] Yes INJECT 1 MG UNDER THE SKIN ONCE A WEEK AS DIRECTED Trixie File, MD  Active Self           Med Note MARISA, NATHANEL SAILOR   Fri Mar 01, 2024  5:59 PM)    sodium chloride  flush (NS) 0.9 % injection 10 mL [id number]   Lanny Callander, MD  Active           Recommendation:   Specialty provider follow-up patient to follow up with provider regarding back pain. Patient to follow up with PCP as scheduled 05/15/24 Per review of chart outpatient rehabilitation appointment noted on 05/16/24.  Follow Up Plan:   Telephone follow up appointment date/time:  05/22/24 at 10:30 am RNCM reached out to [facility] regarding barriers to referral for Physical therapy made on 04/03/24 and 04/17/24.   Heddy Shutter, RN, MSN, BSN, CCM [place]  [facility], Population Health Case Manager Phone: [phone]

## 2024-05-10 NOTE — ED Triage Notes (Signed)
 Pt coming in from [facility] urology for an MRI.

## 2024-05-10 NOTE — ED Triage Notes (Signed)
 Patient states he had a CT scan in follow up on Tues at [facility] and was call to come to ED for MRI states per patient they saw something on CT different from before, states he is having lower back pain.

## 2024-05-10 NOTE — ED Provider Notes (Signed)
 [facility] Provider Note   CSN: [id number] Arrival date & time: 05/10/24  1350   Patient presents with: abnormal CT findings  Robert [name] is a 73 y.o. male with past medical history of hypertension, hyperlipidemia, bladder cancer s/p TURP and left cutaneous ureterostomy, prior right nephrectomy, currently on immunotherapy, pAF on eliquis , and chronic low back pain who presents for further workup of abnormal lumbar spine findings shown on outpatient CT AP.  Reportedly this outpatient CT can showed findings concerning for possible infection or osteomyelitis in his L-spine, so he was sent here for MRI. Patient states that he has had 2 weeks of worsening acute on chronic lower back pain and has required a walker to ambulate during this time, his normal baseline is ambulating well with only a cane.  He otherwise denies any fevers, bowel incontinence, falls, or focal weakness/paresthesias of either lower extremity.  He attributed his lower back pain to his chronic back issues and his bilateral hip arthritis to his difficulty walking for the last 2 weeks.   Prior to Admission medications   Medication Sig Start Date End Date Taking? Authorizing Provider  apixaban  (ELIQUIS ) 5 MG TABS tablet Take 1 tablet (5 mg total) by mouth 2 (two) times daily. 12/21/23  Yes Nahser, Aleene PARAS, MD  atorvastatin  (LIPITOR) 40 MG tablet Take 1 tablet (40 mg total) by mouth at bedtime. 02/07/24  Yes Cheryle Page, MD  B Complex-C (B-COMPLEX WITH VITAMIN C) tablet Take 1 tablet by mouth 3 (three) times a week.   Yes [provider]  Cholecalciferol  (VITAMIN D -3 PO) Take 1 tablet by mouth 3 (three) times a week.   Yes [provider]  EPINEPHrine  0.3 mg/0.3 mL IJ SOAJ injection Inject 0.3 mg into the muscle as needed for anaphylaxis. 09/14/23  Yes Schuman, Lynwood T, PA-C  lidocaine  (LIDODERM ) 5 % Place 1 patch onto the skin daily. Remove & Discard patch within 12  hours or as directed by MD Patient taking differently: Place 1 patch onto the skin daily as needed (for pain- Remove & Discard patch within 12 hours or as directed by MD). 04/03/24  Yes Arlon Carliss ORN, DO  methocarbamol  (ROBAXIN ) 500 MG tablet Take 1 tablet (500 mg total) by mouth every 8 (eight) hours as needed for muscle spasms. 04/03/24  Yes Arlon Carliss ORN, DO  metoprolol  succinate (TOPROL -XL) 25 MG 24 hr tablet Take 1 tablet (25 mg total) by mouth daily. Patient taking differently: Take 25 mg by mouth at bedtime. 09/19/23  Yes Nahser, Aleene PARAS, MD  oxyCODONE  (OXY IR/ROXICODONE ) 5 MG immediate release tablet Take 1 tablet (5 mg total) by mouth every 6 (six) hours as needed for severe pain (pain score 7-10) (severe low back pain). 04/17/24  Yes Plotnikov, Karlynn GAILS, MD  propranolol  (INDERAL ) 10 MG tablet TAKE 1 TABLET BY MOUTH 4 TIMES DAILY AS NEEDED FOR PALPITATIONS, IRREGULAR OR FAST HEART RATE Patient taking differently: Take 10 mg by mouth 4 (four) times daily as needed (for palpatations and/or an irregular or fast heart rate). 10/25/22  Yes Nahser, Aleene PARAS, MD  Semaglutide , 1 MG/DOSE, (OZEMPIC , 1 MG/DOSE,) 4 MG/3ML SOPN INJECT 1 MG UNDER THE SKIN ONCE A WEEK AS DIRECTED Patient taking differently: Inject 1 mg into the skin every Sunday. 07/20/23  Yes Trixie File, MD  TYLENOL  500 MG tablet Take 500-1,000 mg by mouth every 6 (six) hours as needed for mild pain (pain score 1-3) or headache.  Yes [provider]  amoxicillin -clavulanate (AUGMENTIN ) 875-125 MG tablet Take 1 tablet by mouth 2 (two) times daily. Patient not taking: Reported on 05/02/2024 04/07/24   Arlon Carliss ORN, DO  furosemide  (LASIX ) 20 MG tablet Take 1-2 tablets (20-40 mg total) by mouth daily as needed. Patient not taking: Reported on 05/10/2024 04/10/24   Plotnikov, Aleksei V, MD  glucose blood test strip 1 each by Other route daily. Use daily for glucose control - Dx 250.00 04/21/20   Theophilus Andrews, Tully GRADE, MD   pregabalin  (LYRICA ) 75 MG capsule Take 1 capsule (75 mg total) by mouth daily. Patient not taking: Reported on 05/10/2024 04/04/24   Arlon Carliss ORN, DO    Allergies: Lisinopril  and Lyrica  [pregabalin ]     Updated Vital Signs BP 122/64 (BP Location: Left Arm)   Pulse 81   Temp 98.5 F (36.9 C) (Oral)   Resp 19   Ht 6' 3 (1.905 m)   Wt 86.2 kg   SpO2 100%   BMI 23.75 kg/m   Physical Exam Vitals reviewed.  Constitutional:      General: He is not in acute distress.    Appearance: He is not toxic-appearing or diaphoretic.  HENT:     Head: Normocephalic and atraumatic.     Mouth/Throat:     Mouth: Mucous membranes are moist.     Pharynx: Oropharynx is clear.  Eyes:     General: No scleral icterus.    Extraocular Movements: Extraocular movements intact.     Pupils: Pupils are equal, round, and reactive to light.  Cardiovascular:     Rate and Rhythm: Normal rate and regular rhythm.     Pulses: Normal pulses.     Heart sounds: No murmur heard.    No gallop.  Pulmonary:     Effort: Pulmonary effort is normal. No respiratory distress.     Breath sounds: Normal breath sounds.  Chest:     Chest wall: No tenderness.  Abdominal:     General: Abdomen is flat.     Palpations: Abdomen is soft.     Tenderness: There is no abdominal tenderness. There is no right CVA tenderness, left CVA tenderness or guarding.     Comments: Ureteroscopy in place on LLQ, normal-appearing urine in collection bag  Genitourinary:    Comments: No saddle anesthesia Musculoskeletal:        General: Normal range of motion.     Cervical back: Normal range of motion and neck supple. No rigidity, tenderness or bony tenderness.     Thoracic back: No tenderness or bony tenderness.     Lumbar back: No tenderness or bony tenderness. Negative right straight leg raise test and negative left straight leg raise test.     Right lower leg: No edema.     Left lower leg: No edema.  Skin:    General: Skin is warm  and dry.     Capillary Refill: Capillary refill takes less than 2 seconds.  Neurological:     Mental Status: He is alert and oriented to person, place, and time.     Cranial Nerves: No cranial nerve deficit.     Sensory: No sensory deficit.     Motor: No weakness.     (all labs ordered are listed, but only abnormal results are displayed) Labs Reviewed  CBC WITH DIFFERENTIAL/PLATELET - Abnormal; Notable for the following components:      Result Value   Hemoglobin 12.7 (*)    HCT 38.9 (*)  Abs Immature Granulocytes 0.10 (*)    All other components within normal limits  BASIC METABOLIC PANEL WITH GFR - Abnormal; Notable for the following components:   Sodium 134 (*)    Glucose, Bld 225 (*)    All other components within normal limits  SEDIMENTATION RATE - Abnormal; Notable for the following components:   Sed Rate 52 (*)    All other components within normal limits  C-REACTIVE PROTEIN - Abnormal; Notable for the following components:   CRP 2.1 (*)    All other components within normal limits  COMPREHENSIVE METABOLIC PANEL WITH GFR - Abnormal; Notable for the following components:   Sodium 133 (*)    Glucose, Bld 182 (*)    Calcium  8.5 (*)    Total Protein 6.0 (*)    Albumin 2.6 (*)    AST 14 (*)    All other components within normal limits  CBC - Abnormal; Notable for the following components:   RBC 4.01 (*)    Hemoglobin 10.9 (*)    HCT 33.1 (*)    All other components within normal limits  GLUCOSE, CAPILLARY - Abnormal; Notable for the following components:   Glucose-Capillary 144 (*)    All other components within normal limits  GLUCOSE, CAPILLARY - Abnormal; Notable for the following components:   Glucose-Capillary 144 (*)    All other components within normal limits  CULTURE, BLOOD (ROUTINE X 2)  CULTURE, BLOOD (ROUTINE X 2)  PROTIME-INR    EKG: None  Radiology: MR Lumbar Spine W Wo Contrast Result Date: 05/10/2024 CLINICAL DATA:  Provided history: Low back  pain, infection suspected, positive x-ray/CT. Concern for osteomyelitis/discitis (L4-L5). EXAM: MRI LUMBAR SPINE WITHOUT AND WITH CONTRAST TECHNIQUE: Multiplanar and multiecho pulse sequences of the lumbar spine were obtained without and with intravenous contrast. CONTRAST:  10mL GADAVIST  GADOBUTROL  1 MMOL/ML IV SOLN COMPARISON:  CT chest/abdomen/pelvis 05/07/2024. Lumbar spine MRI 03/27/2024. FINDINGS: Segmentation: 5 lumbar vertebrae. The caudal most well-formed tibial disc space is designated L5-S1. Alignment: Mild grade 1 retrolisthesis at L1-L2, L2-L3, L3-L4 and L4-L5. Mild grade 1 anterolisthesis at L5-S1. Vertebrae: New from the prior lumbar spine MRI 03/27/2024, there is prominent T1 hypointense and STIR hyperintense signal abnormality within the L4 and L5 vertebral bodies, centered along the L4-L5 disc space, with corresponding extensive pathologic enhancement. Signal abnormality and pathologic enhancement extends into the posterior elements on the left at L5. Additionally, there is new endplate irregularity and disc signal abnormality at L4-L5. This constellation of findings is consistent with sequela of discitis/osteomyelitis. To a lesser degree, there is new edema signal and enhancement within the S1 vertebral body and there is subtle abnormal enhancement along the ventral aspect of the L5-S1 disc. These findings likely reflect early involvement of the L5-S1 disc and S1 vertebral body. Also new from the prior MRI, there is ill-defined abnormal enhancement within the ventral epidural space at the L4-L5 disc level and L5 vertebral body level, likely reflecting epidural phlegmon (series 11, image 10). No well-defined epidural abscess is present at this time. There is no more than mild spinal canal narrowing at the L5 vertebral body level. Conus medullaris and cauda equina: Conus extends to the L1-L2 level. Signal abnormality identified within the visualized distal spinal cord. No pathologic enhancement of  the cauda equina nerve roots. Paraspinal and other soft tissues: Abnormal enhancing soft tissue along the ventral and lateral aspects of the L4-L5 vertebrae (right greater than left) consistent with paraspinal soft tissue infection/phlegmon. Additionally, there is right  psoas edema and enhancement consistent with right psoas involvement and myositis. No appreciable right psoas abscess. Disc levels: A caudally migrated right subarticular disc extrusion at L3-L4 has somewhat regressed. This contributes to moderate to severe right subarticular stenosis, slightly improved from the prior exam. Lumbar spondylosis has otherwise not significantly changed from the recent prior lumbar spine MRI of 03/27/2024. IMPRESSION: 1. Findings consistent with discitis/osteomyelitis at the L4-L5 and L5-S1 levels, as detailed within the body of the report. Abnormal enhancement also present within the ventral epidural space at the L4-L5 disc level, and L5 vertebral body level, likely reflecting epidural phlegmon. No well-defined epidural abscess is present at this time. 2. Associated paraspinal soft tissue infection/phlegmon centered at L4-L5 with involvement of the right psoas muscle. No appreciable right psoas abscess. 3. A caudally migrated right subarticular disc extrusion at L3-L4 has somewhat regressed. This contributes to moderate-to-severe right subarticular stenosis, which has slightly improved. Lumbar spondylosis has otherwise not significantly changed from the recent prior lumbar spine MRI of 03/27/2024. Please refer to this prior examination for further description. Electronically Signed   By: Rockey Childs D.O.   On: 05/10/2024 17:53     Medications Ordered in the ED  propranolol  (INDERAL ) tablet 10 mg (has no administration in time range)  B-complex with vitamin C tablet 1 tablet (has no administration in time range)  metoprolol  succinate (TOPROL -XL) 24 hr tablet 25 mg (has no administration in time range)   atorvastatin  (LIPITOR) tablet 40 mg (has no administration in time range)  methocarbamol  (ROBAXIN ) tablet 500 mg (500 mg Oral Given 05/11/24 0256)  lidocaine  (LIDODERM ) 5 % 1 patch (has no administration in time range)  oxyCODONE  (Oxy IR/ROXICODONE ) immediate release tablet 5 mg (has no administration in time range)  acetaminophen  (TYLENOL ) tablet 500-1,000 mg (1,000 mg Oral Given 05/11/24 0850)  cholecalciferol  (VITAMIN D3) 25 MCG (1000 UNIT) tablet 5,000 Units (has no administration in time range)  lactated ringers  infusion ( Intravenous New Bag/Given 05/11/24 1304)  morphine  (PF) 2 MG/ML injection 2 mg (2 mg Intravenous Given 05/11/24 1304)  ondansetron  (ZOFRAN ) tablet 4 mg (has no administration in time range)    Or  ondansetron  (ZOFRAN ) injection 4 mg (has no administration in time range)  insulin  aspart (novoLOG ) injection 0-15 Units (2 Units Subcutaneous Given 05/11/24 1156)  insulin  aspart (novoLOG ) injection 0-5 Units (has no administration in time range)  gadobutrol  (GADAVIST ) 1 MMOL/ML injection 10 mL (10 mLs Intravenous Contrast Given 05/10/24 1658)    Clinical Course as of 05/11/24 1615  Fri May 10, 2024  1714 Sed Rate(!): 52 [AD]  1714 CRP(!): 2.1 [AD]  1714 CBC with Differential(!) unremarkable [AD]  1714 Basic metabolic panel(!) unremarkable [AD]  1804 MR Lumbar Spine W Wo Contrast Findings consistent with discitis/osteomyelitis at the L4-L5 and L5-S1 levels, as detailed within the body of the report. Abnormal enhancement also present within the ventral epidural space at the L4-L5 disc level, and L5 vertebral body level, likely reflecting epidural phlegmon. No well-defined epidural abscess is present at this time. Associated paraspinal soft tissue infection/phlegmon centered at L4-L5 with involvement of the right psoas muscle. No appreciable right psoas abscess. A caudally migrated right subarticular disc extrusion at L3-L4 has somewhat regressed. This contributes to  moderate-to-severe right subarticular stenosis, which has slightly improved. Lumbar spondylosis has otherwise not significantly changed from the recent prior lumbar spine MRI of 03/27/2024.   [AD]    Clinical Course User Index [AD] Raoul Rake, MD    Medical Decision Making Patient  with the above history is presenting for further evaluation of findings on outpatient CT AP concerning for possible discitis/osteomyelitis of the L-spine. Patient reports 2 weeks of acute-on-chronic worsening lower back pain and increased difficulty walking with increased required assistance from baseline cane to a rolling walker, which he also initially attributed to his chronic bilateral hip OA. He otherwise denies significant malaise, fevers/chills, or other new systemic symptoms recently. His exam is overall reassuring with no focal neuro deficit, saddle anesthesia, or decreased tone of the lower extremities.  Will order MRI L-spine, basic labs, and inflammatory markers.  As above, patient's workup resulted with mildly increased inflammatory markers, no leukocytosis, and findings of L4-S1 discitis/osteomyelitis with adjacent epidural phlegmon. Patient was updated on these results and blood cultures were collected.  Neurosurgery was consulted who recommended medicine admission and engaging IR for possible sampling. Medicine team requested ID consult, who recommended holding on antibiotics until sample is collected. This was relayed to the hospitalist team who then admitted the patient for further evaluation/inpatient management.   Amount and/or Complexity of Data Reviewed Labs:  Decision-making details documented in ED Course. Radiology:  Decision-making details documented in ED Course.  Risk Decision regarding hospitalization.    Final diagnoses:  Discitis of lumbar region  Acute osteomyelitis of lumbar spine [facility])    ED Discharge Orders     None          Raoul Rake, MD 05/11/24  1615    Patt Alm Macho, MD 05/11/24 [phone]

## 2024-05-11 DIAGNOSIS — G061 Intraspinal abscess and granuloma: Secondary | ICD-10-CM | POA: Diagnosis not present

## 2024-05-11 DIAGNOSIS — M4627 Osteomyelitis of vertebra, lumbosacral region: Secondary | ICD-10-CM

## 2024-05-11 DIAGNOSIS — B9689 Other specified bacterial agents as the cause of diseases classified elsewhere: Secondary | ICD-10-CM | POA: Diagnosis not present

## 2024-05-11 DIAGNOSIS — M4647 Discitis, unspecified, lumbosacral region: Secondary | ICD-10-CM | POA: Diagnosis not present

## 2024-05-11 LAB — CBC
HCT: 33.1 % — ABNORMAL LOW (ref 39.0–52.0)
Hemoglobin: 10.9 g/dL — ABNORMAL LOW (ref 13.0–17.0)
MCH: 27.2 pg (ref 26.0–34.0)
MCHC: 32.9 g/dL (ref 30.0–36.0)
MCV: 82.5 fL (ref 80.0–100.0)
Platelets: 202 K/uL (ref 150–400)
RBC: 4.01 MIL/uL — ABNORMAL LOW (ref 4.22–5.81)
RDW: 14.7 % (ref 11.5–15.5)
WBC: 8.6 K/uL (ref 4.0–10.5)
nRBC: 0 % (ref 0.0–0.2)

## 2024-05-11 LAB — COMPREHENSIVE METABOLIC PANEL WITH GFR
ALT: 12 U/L (ref 0–44)
AST: 14 U/L — ABNORMAL LOW (ref 15–41)
Albumin: 2.6 g/dL — ABNORMAL LOW (ref 3.5–5.0)
Alkaline Phosphatase: 75 U/L (ref 38–126)
Anion gap: 6 (ref 5–15)
BUN: 14 mg/dL (ref 8–23)
CO2: 24 mmol/L (ref 22–32)
Calcium: 8.5 mg/dL — ABNORMAL LOW (ref 8.9–10.3)
Chloride: 103 mmol/L (ref 98–111)
Creatinine, Ser: 1.15 mg/dL (ref 0.61–1.24)
GFR, Estimated: >60 mL/min (ref >60)
Glucose, Bld: 182 mg/dL — ABNORMAL HIGH (ref 70–99)
Potassium: 3.8 mmol/L (ref 3.5–5.1)
Sodium: 133 mmol/L — ABNORMAL LOW (ref 135–145)
Total Bilirubin: 0.5 mg/dL (ref 0.0–1.2)
Total Protein: 6 g/dL — ABNORMAL LOW (ref 6.5–8.1)

## 2024-05-11 LAB — GLUCOSE, CAPILLARY
Glucose-Capillary: 144 mg/dL — ABNORMAL HIGH (ref 70–99)
Glucose-Capillary: 144 mg/dL — ABNORMAL HIGH (ref 70–99)
Glucose-Capillary: 200 mg/dL — ABNORMAL HIGH (ref 70–99)
Glucose-Capillary: 113 mg/dL — ABNORMAL HIGH (ref 70–99)

## 2024-05-11 LAB — PROTIME-INR
INR: 1 (ref 0.8–1.2)
Prothrombin Time: 13.9 s (ref 11.4–15.2)

## 2024-05-11 MED ORDER — B COMPLEX-C PO TABS
1.0000 | ORAL_TABLET | ORAL | Status: DC
Start: 2024-05-13 — End: 2024-05-16
  Administered 2024-05-13 – 2024-05-15 (×2): 1 via ORAL
  Filled 2024-05-11 (×2): qty 1

## 2024-05-11 MED ORDER — OXYCODONE HCL 5 MG PO TABS
5.0000 mg | ORAL_TABLET | Freq: Four times a day (QID) | ORAL | Status: DC | PRN
  Administered 2024-05-11 – 2024-05-14 (×3): 5 mg via ORAL
  Filled 2024-05-11 (×3): qty 1

## 2024-05-11 MED ORDER — METHOCARBAMOL 500 MG PO TABS
500.0000 mg | ORAL_TABLET | Freq: Three times a day (TID) | ORAL | Status: DC | PRN
  Administered 2024-05-11 – 2024-05-13 (×2): 500 mg via ORAL
  Filled 2024-05-11 (×2): qty 1

## 2024-05-11 MED ORDER — PROPRANOLOL HCL 10 MG PO TABS: 10.0000 mg | ORAL_TABLET | Freq: Four times a day (QID) | ORAL | Status: DC | PRN

## 2024-05-11 MED ORDER — ONDANSETRON HCL 4 MG PO TABS: 4.0000 mg | ORAL_TABLET | Freq: Four times a day (QID) | ORAL | Status: DC | PRN

## 2024-05-11 MED ORDER — MORPHINE SULFATE (PF) 2 MG/ML IV SOLN
2.0000 mg | INTRAVENOUS | Status: DC | PRN
  Administered 2024-05-11 – 2024-05-15 (×15): 2 mg via INTRAVENOUS
  Filled 2024-05-11 (×15): qty 1

## 2024-05-11 MED ORDER — VITAMIN D 25 MCG (1000 UNIT) PO TABS
5000.0000 [IU] | ORAL_TABLET | ORAL | Status: DC
  Administered 2024-05-13 – 2024-05-15 (×2): 5000 [IU] via ORAL
  Filled 2024-05-11 (×2): qty 5

## 2024-05-11 MED ORDER — INSULIN ASPART 100 UNIT/ML IJ SOLN
0.0000 [IU] | Freq: Three times a day (TID) | INTRAMUSCULAR | Status: DC
  Administered 2024-05-11 (×2): 2 [IU] via SUBCUTANEOUS
  Administered 2024-05-11: 3 [IU] via SUBCUTANEOUS
  Administered 2024-05-12: 2 [IU] via SUBCUTANEOUS
  Administered 2024-05-12: 5 [IU] via SUBCUTANEOUS
  Administered 2024-05-13: 3 [IU] via SUBCUTANEOUS
  Administered 2024-05-13: 2 [IU] via SUBCUTANEOUS
  Administered 2024-05-13: 3 [IU] via SUBCUTANEOUS
  Administered 2024-05-14: 5 [IU] via SUBCUTANEOUS
  Administered 2024-05-14 – 2024-05-15 (×3): 2 [IU] via SUBCUTANEOUS
  Administered 2024-05-15: 3 [IU] via SUBCUTANEOUS
  Administered 2024-05-15: 2 [IU] via SUBCUTANEOUS

## 2024-05-11 MED ORDER — LACTATED RINGERS IV SOLN: INTRAVENOUS | Status: AC

## 2024-05-11 MED ORDER — ONDANSETRON HCL 4 MG/2ML IJ SOLN: 4.0000 mg | Freq: Four times a day (QID) | INTRAMUSCULAR | Status: DC | PRN

## 2024-05-11 MED ORDER — ACETAMINOPHEN 500 MG PO TABS
500.0000 mg | ORAL_TABLET | Freq: Four times a day (QID) | ORAL | Status: DC | PRN
  Administered 2024-05-11 – 2024-05-15 (×4): 1000 mg via ORAL
  Filled 2024-05-11 (×4): qty 2

## 2024-05-11 MED ORDER — FUROSEMIDE 20 MG PO TABS: 20.0000 mg | ORAL_TABLET | Freq: Every day | ORAL | Status: DC | PRN

## 2024-05-11 MED ORDER — PREGABALIN 25 MG PO CAPS: 75.0000 mg | ORAL_CAPSULE | Freq: Every day | ORAL | Status: DC

## 2024-05-11 MED ORDER — LIDOCAINE 5 % EX PTCH: 1.0000 | MEDICATED_PATCH | Freq: Every day | CUTANEOUS | Status: DC | PRN

## 2024-05-11 MED ORDER — INSULIN ASPART 100 UNIT/ML IJ SOLN
0.0000 [IU] | Freq: Every day | INTRAMUSCULAR | Status: DC
  Administered 2024-05-13: 3 [IU] via SUBCUTANEOUS

## 2024-05-11 MED ORDER — METOPROLOL SUCCINATE ER 25 MG PO TB24
25.0000 mg | ORAL_TABLET | Freq: Every day | ORAL | Status: DC
  Administered 2024-05-11 – 2024-05-14 (×4): 25 mg via ORAL
  Filled 2024-05-11 (×4): qty 1

## 2024-05-11 MED ORDER — ATORVASTATIN CALCIUM 40 MG PO TABS
40.0000 mg | ORAL_TABLET | Freq: Every day | ORAL | Status: DC
  Administered 2024-05-11 – 2024-05-14 (×4): 40 mg via ORAL
  Filled 2024-05-11 (×4): qty 1

## 2024-05-11 NOTE — Consult Note (Signed)
 [facility]    Date of Admission:  05/10/2024     Reason for Consult: vertebral om     Referring Provider: Lou     Lines:  Peripheral iv's  Abx: none        Assessment: New found L4-S1 vertebral om/discitis and epidural phlegmon  Recent e faecalis bacteremia source uti; 04/02/24 tee negative; s/p 2 weeks abx from 04/01/24  73 year old male with ckd3, pafib, bladder cancer status postchemotherapy, cystoprostatectomy, right nephroureterectomy,total urethrectomy now on maintenance immunomodulation with nivolumab  every 4 weeks, recent admission 03/28/24 for enterococcus faecalis uti/bacteremia and 01/2024 with Serratia marcescens UTI/bacteremia, here with l4-s1 om/discitis and epidural phlegmon  03/27/24 mri when e faecalis bsi with acute back pain happened didn't show evidence of OM at that time. Outpatient ct surveillance of bladder cancer showed concerning lumbar spine changes and referred to [facility] ed with mri finding as above   7/18 bcx ngtd   Plan: F/u 7/18 bcx Lack of sepsis agree to hold abx and ask IR to aspirate the disc; e faecalis unusual to do metastatic abscess alone, and would like to make sure we are not missing gram negative organism/mrsa Maintain standard isolation precaution Discussed with primary team      ------------------------------------------------ Principal Problem:   Diskitis Active Problems:   Hyperlipidemia   Essential hypertension   Type 2 diabetes mellitus without complication, without long-term current use of insulin  (HCC)   Paroxysmal A-fib (HCC)   Bladder cancer (HCC)   Bacteremia   CKD (chronic kidney disease), stage III (HCC)    HPI: Robert [name] is a 73 y.o. male on nivolumab  for maintenance metastatic bladder cancer tx, recent bacteremic episodes x2 since 01/2024 here for worsening back pain found to have l4-s1 om/discitis  Reviewed prior chart Discussed with patient  Recent back pain  03/28/24 admission negative mri; finished 2 weeks e faecalis bsi tx; tee negative He had ct scan by oncology for cancer surveillance and had some concerning changes in the lumbar spine so went to  [facility] for mri which showed above finding His back pain was more severe 03/28/24 admission but has been better No f/c or or lower ext neurological deficit    No abx given yet since finished the 2 weeks late June 2025 for e faecalis Elevated sed rate/crp Afebrile No leukocyotis Mri as mentioned  No urinary incontinence or other red flag      Family History  Problem Relation Age of Onset   Pancreatic cancer Mother 58   Cancer Paternal Uncle        unknown type cancer   Aortic aneurysm Other        family history   Cancer Other        Breast (1st degree relative)   Colon cancer Neg Hx    Rectal cancer Neg Hx    Stomach cancer Neg Hx    Esophageal cancer Neg Hx     Social History   Tobacco Use   Smoking status: Some Days    Current packs/day: 0.25    Average packs/day: 0.3 packs/day for 13.5 years (3.6 ttl pk-yrs)    Types: Cigars, Cigarettes    Start date: 2012    Last attempt to quit: 02/22/2011   Smokeless tobacco: Never   Tobacco comments:    Smoking cessation instruction/counseling given:  counseled patient on the dangers of tobacco use, advised patient to stop smoking, and reviewed strategies to  maximize success   Vaping Use   Vaping status: Never Used  Substance Use Topics   Alcohol  use: Yes    Alcohol /week: 3.0 standard drinks of alcohol     Types: 3 Shots of liquor per week    Comment: 6-8oz /wk OCC MARTINI 2 TO 3 X WEEK   Drug use: Never    Allergies  Allergen Reactions   Lisinopril  Swelling and Other (See Comments)    Tongue and throat became swollen   Lyrica  [Pregabalin ] Swelling and Other (See Comments)    Patient noticed swelling of the ankles and feet when taking this, so he stopped using it.    Review of Systems: ROS All Other ROS was negative,  except mentioned above   Past Medical History:  Diagnosis Date   Anticoagulant long-term use    eliquis --- managed by cardiology   Arthritis    Bladder cancer [facility])    urologist--- dr nieves;  overlapping   Cataract    History of basal cell carcinoma (BCC) excision    History of COVID-19 05/2019   per pt mild symptoms that resolved   HLD (hyperlipidemia)    HTN (hypertension)    Hx of colonic polyps    Insulin  dependent type 2 diabetes mellitus [facility])    endocrinologist--- dr trixie --  (03-11-2022 per pt check blood sugar 1-2 times daily,  fasting sugar-- 118-120s)   Nocturia    Paroxysmal atrial fibrillation (HCC) 03/12/2019   cardiologist--- dr calhoun   Renal disorder    Ureterocele    Use of cane as ambulatory aid        Scheduled Meds:  atorvastatin   40 mg Oral QHS   [START ON 05/13/2024] B-complex with vitamin C  1 tablet Oral Once per day on Monday Wednesday Friday   [START ON 05/13/2024] cholecalciferol   5,000 Units Oral Once per day on Monday Wednesday Friday   insulin  aspart  0-15 Units Subcutaneous TID WC   insulin  aspart  0-5 Units Subcutaneous QHS   metoprolol  succinate  25 mg Oral QHS   Continuous Infusions:  lactated ringers  100 mL/hr at 05/11/24 0258   PRN Meds:.acetaminophen , lidocaine , methocarbamol , morphine  injection, ondansetron  **OR** ondansetron  (ZOFRAN ) IV, oxyCODONE , propranolol    OBJECTIVE: Blood pressure 133/79, pulse 66, temperature 98.4 F (36.9 C), resp. rate 18, height 6' 3 (1.905 m), weight 86.2 kg, SpO2 100%.  Physical Exam  General/constitutional: no distress, pleasant HEENT: Normocephalic, PER, Conj Clear, EOMI, Oropharynx clear Neck supple CV: rrr no mrg Lungs: clear to auscultation, normal respiratory effort Abd: Soft, Nontender -- urostomy present Ext: no edema Skin: No Rash Neuro: nonfocal MSK: no peripheral joint swelling/tenderness/warmth; back spines nontender     Lab Results Lab Results  Component Value Date    WBC 8.6 05/11/2024   HGB 10.9 (L) 05/11/2024   HCT 33.1 (L) 05/11/2024   MCV 82.5 05/11/2024   PLT 202 05/11/2024    Lab Results  Component Value Date   CREATININE 1.15 05/11/2024   BUN 14 05/11/2024   NA 133 (L) 05/11/2024   K 3.8 05/11/2024   CL 103 05/11/2024   CO2 24 05/11/2024    Lab Results  Component Value Date   ALT 12 05/11/2024   AST 14 (L) 05/11/2024   ALKPHOS 75 05/11/2024   BILITOT 0.5 05/11/2024      Microbiology: Recent Results (from the past 240 hours)  Blood culture (routine x 2)     Status: None (Preliminary result)   Collection Time: 05/10/24  6:50 PM  Specimen: BLOOD  Result Value Ref Range Status   Specimen Description BLOOD LEFT ANTECUBITAL  Final   Special Requests   Final    BOTTLES DRAWN AEROBIC AND ANAEROBIC Blood Culture adequate volume   Culture   Final    NO GROWTH < 12 HOURS Performed at [facility], [address], [place], [place] [address]    Report Status PENDING  Incomplete  Blood culture (routine x 2)     Status: None (Preliminary result)   Collection Time: 05/10/24  7:05 PM   Specimen: BLOOD LEFT FOREARM  Result Value Ref Range Status   Specimen Description BLOOD LEFT FOREARM  Final   Special Requests   Final    BOTTLES DRAWN AEROBIC AND ANAEROBIC Blood Culture adequate volume   Culture   Final    NO GROWTH < 12 HOURS Performed at [facility], [address] [place], [place] [address]    Report Status PENDING  Incomplete     Serology:    Imaging: If present, new imagings (plain films, ct scans, and mri) have been personally visualized and interpreted; radiology reports have been reviewed. Decision making incorporated into the Impression / Recommendations.  05/10/24 mri lumbar spine 1. Findings consistent with discitis/osteomyelitis at the L4-L5 and L5-S1 levels, as detailed within the body of the report. Abnormal enhancement also present within the ventral epidural space at the L4-L5 disc level, and  L5 vertebral body level, likely reflecting epidural phlegmon. No well-defined epidural abscess is present at this time. 2. Associated paraspinal soft tissue infection/phlegmon centered at L4-L5 with involvement of the right psoas muscle. No appreciable right psoas abscess. 3. A caudally migrated right subarticular disc extrusion at L3-L4 has somewhat regressed. This contributes to moderate-to-severe right subarticular stenosis, which has slightly improved. Lumbar spondylosis has otherwise not significantly changed from the recent prior lumbar spine MRI of 03/27/2024. Please refer to this prior examination for further description.  03/27/24 mri lumbar spine 1. At L3-L4, right subarticular disc protrusion results in moderate to severe right subarticular recess stenosis with potential impingement of descending right L4 nerve root. Mild to moderate canal stenosis and mild right foraminal stenosis. 2. At L4-L5, right subarticular disc protrusion results in moderate right subarticular recess stenosis with potential impingement of descending right L5 nerve roots. Mild canal stenosis. Moderate right and mild-to-moderate left foraminal stenosis. 3. At L5-S1, moderate left foraminal stenosis. 4. Mild left hydronephrosis, better characterized on recent CT  Constance ONEIDA Passer, MD [facility] [phone] pager    05/11/2024, 12:52 PM

## 2024-05-11 NOTE — Plan of Care (Signed)
  Problem: Education: Goal: Knowledge of the procedure and recovery process will improve Outcome: Progressing   Problem: Bowel/Gastric: Goal: Gastrointestinal status for postoperative course will improve Outcome: Progressing   Problem: Pain Management: Goal: General experience of comfort will improve Outcome: Progressing   Problem: Skin Integrity: Goal: Demonstration of wound healing without infection will improve Outcome: Progressing   Problem: Urinary Elimination: Goal: Ability to avoid or minimize complications of infection will improve Outcome: Progressing Goal: Ability to achieve and maintain urine output will improve Outcome: Progressing Goal: Home care management will improve Outcome: Progressing   Problem: Education: Goal: Ability to describe self-care measures that may prevent or decrease complications (Diabetes Survival Skills Education) will improve Outcome: Progressing Goal: Individualized Educational Video(s) Outcome: Progressing   Problem: Coping: Goal: Ability to adjust to condition or change in health will improve Outcome: Progressing   Problem: Fluid Volume: Goal: Ability to maintain a balanced intake and output will improve Outcome: Progressing   Problem: Health Behavior/Discharge Planning: Goal: Ability to identify and utilize available resources and services will improve Outcome: Progressing Goal: Ability to manage health-related needs will improve Outcome: Progressing   Problem: Metabolic: Goal: Ability to maintain appropriate glucose levels will improve Outcome: Progressing   Problem: Nutritional: Goal: Maintenance of adequate nutrition will improve Outcome: Progressing Goal: Progress toward achieving an optimal weight will improve Outcome: Progressing   Problem: Skin Integrity: Goal: Risk for impaired skin integrity will decrease Outcome: Progressing   Problem: Tissue Perfusion: Goal: Adequacy of tissue perfusion will improve Outcome:  Progressing   Problem: Education: Goal: Knowledge of General Education information will improve Description: Including pain rating scale, medication(s)/side effects and non-pharmacologic comfort measures Outcome: Progressing   Problem: Health Behavior/Discharge Planning: Goal: Ability to manage health-related needs will improve Outcome: Progressing   Problem: Clinical Measurements: Goal: Ability to maintain clinical measurements within normal limits will improve Outcome: Progressing Goal: Will remain free from infection Outcome: Progressing Goal: Diagnostic test results will improve Outcome: Progressing Goal: Respiratory complications will improve Outcome: Progressing Goal: Cardiovascular complication will be avoided Outcome: Progressing   Problem: Activity: Goal: Risk for activity intolerance will decrease Outcome: Progressing   Problem: Nutrition: Goal: Adequate nutrition will be maintained Outcome: Progressing   Problem: Coping: Goal: Level of anxiety will decrease Outcome: Progressing   Problem: Elimination: Goal: Will not experience complications related to bowel motility Outcome: Progressing Goal: Will not experience complications related to urinary retention Outcome: Progressing   Problem: Pain Managment: Goal: General experience of comfort will improve and/or be controlled Outcome: Progressing   Problem: Safety: Goal: Ability to remain free from injury will improve Outcome: Progressing   Problem: Skin Integrity: Goal: Risk for impaired skin integrity will decrease Outcome: Progressing

## 2024-05-11 NOTE — Progress Notes (Signed)
 Progress Note   Patient: Robert [name] FMW:[id number] DOB: May 09, 1951 DOA: 05/10/2024     1 DOS: the patient was seen and examined on 05/11/2024   Brief hospital course: Robert [name] is a 73 y.o. male with medical history significant of paroxysmal atrial fibrillation on Eliquis , chronic kidney disease stage III, type 2 diabetes, essential hypertension, hyperlipidemia, recent bacteremia with Bacteroides beta-lactamase positive, Enterococcus faecalis on June 7 with similar bacteria in the urine who was on IV antibiotics and transition to oral antibiotics, patient previously was treated for bladder cancer with chemotherapy, cystoscopy and prostatectomy with right nephro ureterectomy and total urethrectomy who is on chemotherapy now with nivolumab  and has steroids have been persistent UTI with bacteremia in April treated with Cipro  and amoxicillin .  Patient will also have another admission in May with stent placement and had Enterococcus faecalis bacteremia at the time from UTI.  He presented in early July with back pain at which point MRI of the lumbar spine showed no concern for infection.  Again had the Enterococcus faecalis in urine as well as blood.  Patient was evaluated with TTE and TEE but no vegetation.  Was discharged on oral amoxicillin  for 2 weeks after port was removed.  He has surveillance blood cultures with infectious disease Dr. Dennise with no apparent infection.  Today however MRI of the lumbar spine showed evidence of discitis and osteomyelitis at L4-L5 and L5-S1 levels.  There is also soft tissue phlegmon at the level with involvement of the right psoas muscle.  Patient has been admitted for further evaluation and treatment.   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  He remains afebrile, without leukocytosis. -Holding off further antibiotics until disc has been aspirated -IR  consulted, plan for L4-S1 disc aspiration with Dr. DELENA [name] on 7/20  -ID following, appreciate recs -Continue to hold Eliquis    # Type 2 diabetes -CBGs improved to the 140s today -SSI with meals, CBG monitoring   # Paroxysmal atrial fibrillation -Rate controlled with HR in the 60s to 80s -Continue to hold Eliquis  pending IR aspiration of disc, INR 1.0   # Chronic kidney disease stage III -Renal function stable, creatinine at baseline -Avoid nephrotoxic's agents   # Essential hypertension  -Blood pressure is controlled with SBP in the 120s to 140s.  -Continue to monitor   # Hyperlipidemia -Continue statin   # History of bladder cancer -Continue with oncology follow-up       Subjective: Patient evaluated laying comfortably in bed. Reports low back pain that radiates down his right leg. Pain is worse with movement. Denies any fevers, chills, nausea, vomiting or abdominal pain.  Physical Exam: Vitals:   05/11/24 0015 05/11/24 0200 05/11/24 0400 05/11/24 0520  BP: 129/76 117/75 129/69 (!) 140/84  Pulse: 69 71 87 81  Resp: 18 19 18 16   Temp: 98.2 F (36.8 C)  98.3 F (36.8 C) 97.9 F (36.6 C)  TempSrc: Oral  Oral Oral  SpO2: 100% 100% 93% 100%  Weight:      Height:       General: Pleasant, chronically ill elderly man laying in bed. No acute distress. HEENT: [place]/AT. Anicteric sclera CV: RRR. No murmurs, rubs, or gallops.  Pulmonary: Lungs CTAB. Normal effort. No wheezing or rales. Abdominal: Soft, nontender, nondistended. Normal bowel sounds. Urostomy in LLQ with clear yellow urine in bag. MSK: Mild tenderness to palpation of the low back.  Normal ROM. Palpable pedal pulses. Skin: Warm and dry. No obvious rash or lesions. Neuro: A&Ox3. Moves all extremities. Normal sensation to light touch. No focal deficit. Psych: Normal mood and affect  Data Reviewed:     Latest Ref Rng & Units 05/11/2024    4:46 AM 05/10/2024    2:23 PM 05/09/2024    1:27 PM  CBC  WBC 4.0 - 10.5  K/uL 8.6  8.3  9.9   Hemoglobin 13.0 - 17.0 g/dL 89.0  87.2  87.3   Hematocrit 39.0 - 52.0 % 33.1  38.9  37.4   Platelets 150 - 400 K/uL 202  245  248       Latest Ref Rng & Units 05/11/2024    4:46 AM 05/10/2024    2:23 PM 05/09/2024    1:27 PM  BMP  Glucose 70 - 99 mg/dL 817  774  749   BUN 8 - 23 mg/dL 14  16  19    Creatinine 0.61 - 1.24 mg/dL 8.84  8.77  8.75   Sodium 135 - 145 mmol/L 133  134  134   Potassium 3.5 - 5.1 mmol/L 3.8  4.8  4.3   Chloride 98 - 111 mmol/L 103  98  99   CO2 22 - 32 mmol/L 24  28  28    Calcium  8.9 - 10.3 mg/dL 8.5  9.4  9.4       Family Communication: No family at bedside  Disposition: Status is: Inpatient Remains inpatient appropriate because: Management of discitis, pending IR aspiration  Planned Discharge Destination: Pending    Time spent: 35 minutes  Author: Claretta CHRISTELLA Alderman, MD 05/11/2024 7:27 AM  For on call review [email or web address].

## 2024-05-11 NOTE — Progress Notes (Signed)
 2255 Pt informed/reminded to KEEP NPO staring MN for IR procedure mane.  Understanding verbalized.  9374 Kept NPO since Midnight as prescribed.

## 2024-05-11 NOTE — Consult Note (Signed)
 Chief Complaint: Patient was seen in consultation today for discitis/osteomyelitis of lumbar spine  Procedure: Lumbar disc aspiration  Referring Physician(s): Dr. Alm Sherwin Cave (EM) / Dr. Emery Fuss (IM)  Supervising Physician: Philip Cornet  Patient Status: [facility] - In-pt  History of Present Illness: Robert [name] is a 73 y.o. male with a history of Afib on Eliquis , T2DM, metastatic bladder cancer s/p radical cystectomy with ileal conduit, total urethrectomy, right nephroureterectomy now with solitary left kidney and cutaneous ureterostomy managed with a ureteral stent on immunotherapy. He was recently discharged following admission for lumbar radiculopathy and bacteremia secondary to complicated UTI. He returned to the ED on 7/18 at the referral of his Urologist following outpatient CT scan concerning for osteomyelitis/discitis. Workup revealed WBC WNL and MR Lumbar with findings consistent with osteomyelitis at L4-5 and L5-S1 and possible epidural and paraspinal phlegmon. IR consulted for possible disc aspiration. Case and imaging reviewed and approved by Dr. DELENA Philip.   Patient is sitting upright in bedside chair eating lunch. Admits to intermittent 4-6/10 pain depending on position and movement. He feels that his pain has not worsened since his discharge on 6/11. States that pain will radiate to bilateral hips, but denies any pain down into his legs. Denies any fevers/chills, abdominal pain, shortness of breath, or bowel/bladder incontinence. All questions and concerns answered at the bedside.   Code Status: Full Code  Past Medical History:  Diagnosis Date   Anticoagulant long-term use    eliquis --- managed by cardiology   Arthritis    Bladder cancer [facility])    urologist--- dr nieves;  overlapping   Cataract    History of basal cell carcinoma (BCC) excision    History of COVID-19 05/2019   per pt mild symptoms that resolved   HLD (hyperlipidemia)    HTN  (hypertension)    Hx of colonic polyps    Insulin  dependent type 2 diabetes mellitus [facility])    endocrinologist--- dr trixie --  (03-11-2022 per pt check blood sugar 1-2 times daily,  fasting sugar-- 118-120s)   Nocturia    Paroxysmal atrial fibrillation (HCC) 03/12/2019   cardiologist--- dr calhoun   Renal disorder    Ureterocele    Use of cane as ambulatory aid     Past Surgical History:  Procedure Laterality Date   BLEPHAROPLASTY Bilateral    per pt approx  2001;   upper eyelid's   COLONOSCOPY     last one approx 2023   CYSTOSCOPY W/ RETROGRADES N/A 12/08/2023   Procedure: CYSTOSCOPY;  Surgeon: Alvaro Ricardo KATHEE Mickey., MD;  Location: [facility];  Service: Urology;  Laterality: N/A;  390 MINUTES NEEDED FOR CASE   CYSTOSCOPY WITH URETEROSCOPY AND STENT PLACEMENT Right 12/02/2022   Procedure: CYSTOSCOPY WITH RIGHT URETEROSCOPY, RIGHT DIAGNOSTIC URETEROSCOPY;  Surgeon: nieves Cough, MD;  Location: [facility];  Service: Urology;  Laterality: Right;  90 MINS FOR CASE   CYSTOSCOPY/RETROGRADE/URETEROSCOPY Bilateral 05/05/2023   Procedure: CYSTOSCOPY BILATERAL RETROGRADE PYELOGRAM RIGHT URETEROSCOPY RIGHT STENT PLACEMENT;  Surgeon: nieves Cough, MD;  Location: [facility];  Service: Urology;  Laterality: Bilateral;  90 MINS FOR CASE   IR IMAGING GUIDED PORT INSERTION  05/30/2023   IR REMOVAL TUN ACCESS W/ PORT W/O FL MOD SED  03/29/2024   PATELLAR TENDON REPAIR Left 04/16/2004   @[facility]    ROBOT ASSITED LAPAROSCOPIC NEPHROURETERECTOMY Right 12/08/2023   Procedure: RIGHT ROBOT ASSISTED Nephroureterectomy;  Surgeon: Alvaro Ricardo KATHEE Mickey., MD;  Location: [facility];  Service: Urology;  Laterality: Right;   ROBOT LAP RADICAL CYSTOPROSTATECTOMY PELVIC LYMPHADENECTOMY, NEOBLADDER  12/08/2023   Procedure: ROBOT ASSISTED LAPAROSCOPIC RADICAL CYSTOPROSTATECTOMY;  Surgeon: Alvaro Ricardo KATHEE Mickey., MD;  Location: [facility];  Service: Urology;;   TOTAL URETHRECTOMY,RADICAL N/A 12/08/2023   Procedure: TOTAL  URETHRECTOMY,RADICAL;  Surgeon: Alvaro Ricardo KATHEE Mickey., MD;  Location: [facility];  Service: Urology;  Laterality: N/A;   TRANSESOPHAGEAL ECHOCARDIOGRAM (CATH LAB) N/A 04/02/2024   Procedure: TRANSESOPHAGEAL ECHOCARDIOGRAM;  Surgeon: Delford Maude BROCKS, MD;  Location: [facility] INVASIVE CV LAB;  Service: Cardiovascular;  Laterality: N/A;   TRANSURETHRAL RESECTION OF BLADDER TUMOR N/A 06/18/2021   Procedure: TRANSURETHRAL RESECTION OF BLADDER TUMOR (TURBT) WITH RIGHT URETERAL STENT PLACEMENT, RIGHT URETEROSCOPY WITH DISTRACTION OF TUMOR, FULGURATION;  Surgeon: Nieves Cough, MD;  Location: [facility];  Service: Urology;  Laterality: N/A;   TRANSURETHRAL RESECTION OF BLADDER TUMOR N/A 03/15/2022   Procedure: TRANSURETHRAL RESECTION OF BLADDER TUMOR (TURBT) WITH BILATERAL URETEROSCOPY/URETHRAL/ DILATION/BILATERAL RETROGRADE PYELOGRAM/BIOPSY AND FULGURATION OF RIGHT URETERAL CANCER/RIGHT STENT PLACEMENT;  Surgeon: Nieves Cough, MD;  Location: [facility];  Service: Urology;  Laterality: N/A;   TRANSURETHRAL RESECTION OF BLADDER TUMOR N/A 05/05/2023   Procedure: TRANSURETHRAL RESECTION OF BLADDER TUMOR (TURBT) BLADDER BIOSPY;  Surgeon: Nieves Cough, MD;  Location: [facility];  Service: Urology;  Laterality: N/A;   TRANSURETHRAL RESECTION OF BLADDER TUMOR WITH MITOMYCIN -C N/A 12/02/2022   Procedure: TRANSURETHRAL RESECTION OF BLADDER TUMOR;  Surgeon: Nieves Cough, MD;  Location: [facility];  Service: Urology;  Laterality: N/A;   TRANSURETHRAL RESECTION OF PROSTATE  05/05/2023   Procedure: TRANSURETHRAL RESECTION OF THE PROSTATE (TURP);  Surgeon: Nieves Cough, MD;  Location: [facility];  Service: Urology;;    Allergies: Lisinopril  and Lyrica  [pregabalin ]  Medications: Prior to Admission medications   Medication Sig Start Date End Date Taking? Authorizing Provider  apixaban  (ELIQUIS ) 5 MG TABS tablet Take 1 tablet (5 mg total) by mouth 2  (two) times daily. 12/21/23  Yes Nahser, Aleene PARAS, MD  atorvastatin  (LIPITOR) 40 MG tablet Take 1 tablet (40 mg total) by mouth at bedtime. 02/07/24  Yes Cheryle Page, MD  B Complex-C (B-COMPLEX WITH VITAMIN C) tablet Take 1 tablet by mouth 3 (three) times a week.   Yes [provider]  Cholecalciferol  (VITAMIN D -3 PO) Take 1 tablet by mouth 3 (three) times a week.   Yes [provider]  EPINEPHrine  0.3 mg/0.3 mL IJ SOAJ injection Inject 0.3 mg into the muscle as needed for anaphylaxis. 09/14/23  Yes Schuman, Lynwood T, PA-C  lidocaine  (LIDODERM ) 5 % Place 1 patch onto the skin daily. Remove & Discard patch within 12 hours or as directed by MD Patient taking differently: Place 1 patch onto the skin daily as needed (for pain- Remove & Discard patch within 12 hours or as directed by MD). 04/03/24  Yes Arlon, Carliss ORN, DO  methocarbamol  (ROBAXIN ) 500 MG tablet Take 1 tablet (500 mg total) by mouth every 8 (eight) hours as needed for muscle spasms. 04/03/24  Yes Arlon Carliss ORN, DO  metoprolol  succinate (TOPROL -XL) 25 MG 24 hr tablet Take 1 tablet (25 mg total) by mouth daily. Patient taking differently: Take 25 mg by mouth at bedtime. 09/19/23  Yes Nahser, Aleene PARAS, MD  oxyCODONE  (OXY IR/ROXICODONE ) 5 MG immediate release tablet Take 1 tablet (5 mg total) by mouth every 6 (six) hours as needed for severe pain (pain score 7-10) (severe low back pain). 04/17/24  Yes Plotnikov, Karlynn GAILS, MD  propranolol  (INDERAL ) 10 MG tablet TAKE 1 TABLET BY MOUTH 4 TIMES DAILY AS NEEDED FOR PALPITATIONS, IRREGULAR OR FAST HEART RATE Patient taking differently: Take 10 mg by mouth 4 (four) times daily as needed (for palpatations and/or an irregular or fast heart rate). 10/25/22  Yes Nahser, Aleene PARAS, MD  Semaglutide , 1 MG/DOSE, (OZEMPIC , 1 MG/DOSE,) 4 MG/3ML SOPN INJECT 1 MG UNDER THE SKIN ONCE A WEEK AS DIRECTED Patient taking differently: Inject 1 mg into the skin every Sunday. 07/20/23  Yes Trixie File, MD  TYLENOL  500 MG tablet Take 500-1,000 mg by mouth every 6 (six) hours as needed for mild pain (pain score 1-3) or headache.   Yes [provider]  amoxicillin -clavulanate (AUGMENTIN ) 875-125 MG tablet Take 1 tablet by mouth 2 (two) times daily. Patient not taking: Reported on 05/02/2024 04/07/24   Arlon Carliss ORN, DO  furosemide  (LASIX ) 20 MG tablet Take 1-2 tablets (20-40 mg total) by mouth daily as needed. Patient not taking: Reported on 05/10/2024 04/10/24   Plotnikov, Aleksei V, MD  glucose blood test strip 1 each by Other route daily. Use daily for glucose control - Dx 250.00 04/21/20   Theophilus Andrews, Tully GRADE, MD  pregabalin  (LYRICA ) 75 MG capsule Take 1 capsule (75 mg total) by mouth daily. Patient not taking: Reported on 05/10/2024 04/04/24   Arlon Carliss ORN, DO     Family History  Problem Relation Age of Onset   Pancreatic cancer Mother 32   Cancer Paternal Uncle        unknown type cancer   Aortic aneurysm Other        family history   Cancer Other        Breast (1st degree relative)   Colon cancer Neg Hx    Rectal cancer Neg Hx    Stomach cancer Neg Hx    Esophageal cancer Neg Hx     Social History   Socioeconomic History   Marital status: Married    Spouse name: Not on file   Number of children: 1   Years of education: Not on file   Highest education level: Some college, no degree  Occupational History   Occupation: DVD's   Occupation: Occupational hygienist: Self Employed  Tobacco Use   Smoking status: Some Days    Current packs/day: 0.25    Average packs/day: 0.3 packs/day for 13.5 years (3.6 ttl pk-yrs)    Types: Cigars, Cigarettes    Start date: 2012    Last attempt to quit: 02/22/2011   Smokeless tobacco: Never   Tobacco comments:    Smoking cessation instruction/counseling given:  counseled patient on the dangers of tobacco use, advised patient to stop smoking, and reviewed strategies to maximize success   Vaping Use   Vaping  status: Never Used  Substance and Sexual Activity   Alcohol  use: Yes    Alcohol /week: 3.0 standard drinks of alcohol     Types: 3 Shots of liquor per week    Comment: 6-8oz /wk OCC MARTINI 2 TO 3 X WEEK   Drug use: Never   Sexual activity: Not Currently  Other Topics Concern   Not on file  Social History Narrative   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   Current Smoker   Social Drivers of Health   Financial Resource Strain: Low Risk  (04/08/2024)   Overall Financial Resource Strain (CARDIA)    Difficulty of Paying Living Expenses: Not hard at all  Food Insecurity: No Food Insecurity (05/11/2024)   Hunger Vital Sign    Worried About Running Out of Food in the Last Year: Never true    Ran Out of Food in the Last Year: Never true  Transportation Needs: No Transportation Needs (05/11/2024)   PRAPARE - Administrator, Civil Service (Medical): No    Lack of Transportation (Non-Medical): No  Physical Activity: Inactive (04/08/2024)   Exercise Vital Sign    Days of Exercise per Week: 0 days    Minutes of Exercise per Session: 0 min  Stress: Stress Concern Present (04/08/2024)   [employer] of Occupational Health - Occupational Stress Questionnaire    Feeling of Stress: To some extent  Social Connections: Moderately Integrated (05/11/2024)   Social Connection and Isolation Panel    Frequency of Communication with Friends and Family: More than three times a week    Frequency of Social Gatherings with Friends and Family: Three times a week    Attends Religious Services: More than 4 times per year    Active Member of Clubs or Organizations: No    Attends Banker Meetings: Never    Marital Status: Married  Recent Concern: Social Connections - Moderately Isolated (03/28/2024)   Social Connection and Isolation Panel    Frequency of Communication with Friends and Family: More than three times a week    Frequency of Social Gatherings with  Friends and Family: Twice a week    Attends Religious Services: Never    Database administrator or Organizations: No    Attends Engineer, structural: Never    Marital Status: Married    Review of Systems  Constitutional:  Negative for chills and fever.  Musculoskeletal:  Positive for back pain (lower).  Denies any N/V, chest pain, shortness of breath, fevers/chills, bowel/bladder incontinence. All other ROS negative.  Vital Signs: BP 137/68 (BP Location: Left Arm)   Pulse 70   Temp 97.9 F (36.6 C) (Oral)   Resp 18   Ht 6' 3 (1.905 m)   Wt 190 lb (86.2 kg)   SpO2 100%   BMI 23.75 kg/m    Physical Exam Vitals reviewed.  Constitutional:      Appearance: Normal appearance.  HENT:     Head: Normocephalic and atraumatic.     Mouth/Throat:     Mouth: Mucous membranes are moist.     Pharynx: Oropharynx is clear.  Cardiovascular:     Rate and Rhythm: Normal rate and regular rhythm.     Heart sounds: Normal heart sounds.  Pulmonary:     Effort: Pulmonary effort is normal.     Breath sounds: Normal breath sounds.  Abdominal:     General: Abdomen is flat.     Tenderness: There is no abdominal tenderness.  Genitourinary:    Comments: Ileoconduit in place w/ ~200cc clear urine in bag Musculoskeletal:        General: Normal range of motion.     Cervical back: Normal range of motion.     Comments: No TTP of lumbar spine/paraspinous muscles  Skin:    General: Skin is warm.  Neurological:     General: No focal deficit present.     Mental Status: He is alert and oriented to person, place, and time. Mental status is at baseline.  Psychiatric:        Mood and Affect: Mood normal.        Behavior: Behavior normal.  Judgment: Judgment normal.     Imaging: MR Lumbar Spine W Wo Contrast Result Date: 05/10/2024 CLINICAL DATA:  Provided history: Low back pain, infection suspected, positive x-ray/CT. Concern for osteomyelitis/discitis (L4-L5). EXAM: MRI LUMBAR  SPINE WITHOUT AND WITH CONTRAST TECHNIQUE: Multiplanar and multiecho pulse sequences of the lumbar spine were obtained without and with intravenous contrast. CONTRAST:  10mL GADAVIST  GADOBUTROL  1 MMOL/ML IV SOLN COMPARISON:  CT chest/abdomen/pelvis 05/07/2024. Lumbar spine MRI 03/27/2024. FINDINGS: Segmentation: 5 lumbar vertebrae. The caudal most well-formed tibial disc space is designated L5-S1. Alignment: Mild grade 1 retrolisthesis at L1-L2, L2-L3, L3-L4 and L4-L5. Mild grade 1 anterolisthesis at L5-S1. Vertebrae: New from the prior lumbar spine MRI 03/27/2024, there is prominent T1 hypointense and STIR hyperintense signal abnormality within the L4 and L5 vertebral bodies, centered along the L4-L5 disc space, with corresponding extensive pathologic enhancement. Signal abnormality and pathologic enhancement extends into the posterior elements on the left at L5. Additionally, there is new endplate irregularity and disc signal abnormality at L4-L5. This constellation of findings is consistent with sequela of discitis/osteomyelitis. To a lesser degree, there is new edema signal and enhancement within the S1 vertebral body and there is subtle abnormal enhancement along the ventral aspect of the L5-S1 disc. These findings likely reflect early involvement of the L5-S1 disc and S1 vertebral body. Also new from the prior MRI, there is ill-defined abnormal enhancement within the ventral epidural space at the L4-L5 disc level and L5 vertebral body level, likely reflecting epidural phlegmon (series 11, image 10). No well-defined epidural abscess is present at this time. There is no more than mild spinal canal narrowing at the L5 vertebral body level. Conus medullaris and cauda equina: Conus extends to the L1-L2 level. Signal abnormality identified within the visualized distal spinal cord. No pathologic enhancement of the cauda equina nerve roots. Paraspinal and other soft tissues: Abnormal enhancing soft tissue along the  ventral and lateral aspects of the L4-L5 vertebrae (right greater than left) consistent with paraspinal soft tissue infection/phlegmon. Additionally, there is right psoas edema and enhancement consistent with right psoas involvement and myositis. No appreciable right psoas abscess. Disc levels: A caudally migrated right subarticular disc extrusion at L3-L4 has somewhat regressed. This contributes to moderate to severe right subarticular stenosis, slightly improved from the prior exam. Lumbar spondylosis has otherwise not significantly changed from the recent prior lumbar spine MRI of 03/27/2024. IMPRESSION: 1. Findings consistent with discitis/osteomyelitis at the L4-L5 and L5-S1 levels, as detailed within the body of the report. Abnormal enhancement also present within the ventral epidural space at the L4-L5 disc level, and L5 vertebral body level, likely reflecting epidural phlegmon. No well-defined epidural abscess is present at this time. 2. Associated paraspinal soft tissue infection/phlegmon centered at L4-L5 with involvement of the right psoas muscle. No appreciable right psoas abscess. 3. A caudally migrated right subarticular disc extrusion at L3-L4 has somewhat regressed. This contributes to moderate-to-severe right subarticular stenosis, which has slightly improved. Lumbar spondylosis has otherwise not significantly changed from the recent prior lumbar spine MRI of 03/27/2024. Please refer to this prior examination for further description. Electronically Signed   By: Rockey Childs D.O.   On: 05/10/2024 17:53   DG INJECT DIAG/THERA/INC NEEDLE/CATH/PLC EPI/LUMB/SAC W/IMG Result Date: 04/23/2024 CLINICAL DATA:  Lumbosacral spondylosis without myelopathy. Lumbar radiculopathy. Low back pain and right leg pain including in the anterior thigh and knee. Disc herniation at L3-4 with right lateral recess stenosis. FLUOROSCOPY: Radiation Exposure Index (as provided by the fluoroscopic device): 0.60 mGy  Kerma  PROCEDURE: The procedure, risks, benefits, and alternatives were explained to the patient. Questions regarding the procedure were encouraged and answered. The patient understands and consents to the procedure. LUMBAR EPIDURAL INJECTION: An interlaminar approach was performed on the right at L3-4. The overlying skin was cleansed and anesthetized. A 3.5 inch 20 gauge epidural needle was advanced using loss-of-resistance technique. DIAGNOSTIC EPIDURAL INJECTION Injection of Isovue -M 200 shows a good epidural pattern with spread above and below the level of needle placement primarily on the right. No vascular opacification is seen. THERAPEUTIC EPIDURAL INJECTION: 80 mg of Depo-Medrol  mixed with 2 mL of 1% lidocaine  were instilled. The procedure was well-tolerated, and the patient was discharged thirty minutes following the injection in good condition. COMPLICATIONS: None immediate IMPRESSION: Technically successful interlaminar epidural injection on the right at L3-4. Electronically Signed   By: Dasie Hamburg M.D.   On: 04/23/2024 15:06    Labs:  CBC: Recent Labs    04/23/24 1523 05/09/24 1327 05/10/24 1423 05/11/24 0446  WBC 5.9 9.9 8.3 8.6  HGB 11.0* 12.6* 12.7* 10.9*  HCT 35.4* 37.4* 38.9* 33.1*  PLT 186 248 245 202    COAGS: No results for input(s): INR, APTT in the last 8760 hours.  BMP: Recent Labs    04/11/24 1302 04/23/24 1523 05/09/24 1327 05/10/24 1423 05/11/24 0446  NA 129* 137 134* 134* 133*  K 4.4 4.3 4.3 4.8 3.8  CL 95* 102 99 98 103  CO2 27 25 28 28 24   GLUCOSE 295* 282* 250* 225* 182*  BUN 27* 24 19 16 14   CALCIUM  9.1 9.0 9.4 9.4 8.5*  CREATININE 1.31* 1.26 1.24 1.22 1.15  GFRNONAA 57*  --  >60 >60 >60    LIVER FUNCTION TESTS: Recent Labs    03/27/24 1444 04/11/24 1302 04/23/24 1523 05/09/24 1327 05/11/24 0446  BILITOT 0.9 0.5 0.4 0.4 0.5  AST 19 11* 19 13* 14*  ALT 13 17 14 13 12   ALKPHOS 81 84  --  96 75  PROT 7.8 7.0 6.3 7.3 6.0*  ALBUMIN 3.5 3.7   --  3.8 2.6*    TUMOR MARKERS: No results for input(s): AFPTM, CEA, CA199, CHROMGRNA in the last 8760 hours.  Assessment and Plan:  L4-S1 discitis/osteomyelitis with possible epidural/paraspinal phlegmon: Robert [name] is a 73 y.o. male with a history of metastatic bladder cancer s/p radical cystectomy with ileal conduit now with solitary left kidney, Afib on Eliquis , and recent admission for bacteremia secondary to complicated UTI. Patient represented to ED after outside imaging concerning for osteomyelitis of L4-5. IR consulted for L4-5, L5-S1 disc aspiration. Approved by Dr. DELENA Balder.   -Afebrile, VSS, WBC 8.6 today  -On Eliquis , last dose 7/18 @ 1000 -continue to hold Eliquis ; INR 1.0 on 7/19 -NPO at [facility] -Plan for L4-S1 disc aspiration with Dr. DELENA Balder on 7/20  Risks and benefits of L4-S1 disc aspiration were discussed with the patient including bleeding, infection, damage to adjacent structures, and sepsis.  All of the patient's questions were answered, patient is agreeable to proceed. Consent signed and in chart.   Thank you for this interesting consult. I greatly enjoyed meeting Robert [name] and look forward to participating in their care. A copy of this report was sent to the requesting provider on this date.  Electronically Signed: Mellina Benison M Antavious Spanos, PA-C 05/11/2024, 11:42 AM   I spent a total of 40 Minutes in face to face clinical consultation, greater than 50% of which was  counseling/coordinating care for lumbar disc aspiration.

## 2024-05-12 ENCOUNTER — Inpatient Hospital Stay (HOSPITAL_COMMUNITY)

## 2024-05-12 DIAGNOSIS — M4647 Discitis, unspecified, lumbosacral region: Secondary | ICD-10-CM | POA: Diagnosis not present

## 2024-05-12 HISTORY — PX: IR LUMBAR DISC ASPIRATION W/IMG GUIDE: IMG5306

## 2024-05-12 LAB — CBC
HCT: 34.2 % — ABNORMAL LOW (ref 39.0–52.0)
Hemoglobin: 11.3 g/dL — ABNORMAL LOW (ref 13.0–17.0)
MCH: 27 pg (ref 26.0–34.0)
MCHC: 33 g/dL (ref 30.0–36.0)
MCV: 81.6 fL (ref 80.0–100.0)
Platelets: 180 K/uL (ref 150–400)
RBC: 4.19 MIL/uL — ABNORMAL LOW (ref 4.22–5.81)
RDW: 14.6 % (ref 11.5–15.5)
WBC: 7.6 K/uL (ref 4.0–10.5)
nRBC: 0 % (ref 0.0–0.2)

## 2024-05-12 LAB — BASIC METABOLIC PANEL WITH GFR
Anion gap: 8 (ref 5–15)
BUN: 15 mg/dL (ref 8–23)
CO2: 23 mmol/L (ref 22–32)
Calcium: 8.8 mg/dL — ABNORMAL LOW (ref 8.9–10.3)
Chloride: 103 mmol/L (ref 98–111)
Creatinine, Ser: 1.05 mg/dL (ref 0.61–1.24)
GFR, Estimated: >60 mL/min (ref >60)
Glucose, Bld: 118 mg/dL — ABNORMAL HIGH (ref 70–99)
Potassium: 3.9 mmol/L (ref 3.5–5.1)
Sodium: 134 mmol/L — ABNORMAL LOW (ref 135–145)

## 2024-05-12 LAB — GLUCOSE, CAPILLARY
Glucose-Capillary: 145 mg/dL — ABNORMAL HIGH (ref 70–99)
Glucose-Capillary: 111 mg/dL — ABNORMAL HIGH (ref 70–99)
Glucose-Capillary: 207 mg/dL — ABNORMAL HIGH (ref 70–99)
Glucose-Capillary: 138 mg/dL — ABNORMAL HIGH (ref 70–99)

## 2024-05-12 MED ORDER — FENTANYL CITRATE (PF) 100 MCG/2ML IJ SOLN
INTRAMUSCULAR | Status: AC | PRN
  Administered 2024-05-12 (×2): 50 ug via INTRAVENOUS

## 2024-05-12 MED ORDER — LIDOCAINE HCL 1 % IJ SOLN
10.0000 mL | Freq: Once | INTRAMUSCULAR | Status: AC
  Administered 2024-05-12: 10 mL

## 2024-05-12 MED ORDER — MIDAZOLAM HCL 2 MG/2ML IJ SOLN
INTRAMUSCULAR | Status: AC | PRN
  Administered 2024-05-12 (×2): 1 mg via INTRAVENOUS

## 2024-05-12 MED ORDER — MIDAZOLAM HCL 2 MG/2ML IJ SOLN
INTRAMUSCULAR | Status: AC
  Filled 2024-05-12: qty 2

## 2024-05-12 MED ORDER — LIDOCAINE HCL 1 % IJ SOLN
INTRAMUSCULAR | Status: AC
  Filled 2024-05-12: qty 20

## 2024-05-12 MED ORDER — FENTANYL CITRATE (PF) 100 MCG/2ML IJ SOLN
INTRAMUSCULAR | Status: AC
  Filled 2024-05-12: qty 2

## 2024-05-12 NOTE — Progress Notes (Signed)
 Progress Note   Patient: Robert [name] FMW:[id number] DOB: 25-May-1951 DOA: 05/10/2024     2 DOS: the patient was seen and examined on 05/12/2024   Brief hospital course: Robert [name] is a 73 y.o. male with medical history significant of paroxysmal atrial fibrillation on Eliquis , chronic kidney disease stage III, type 2 diabetes, essential hypertension, hyperlipidemia, recent bacteremia with Bacteroides beta-lactamase positive, Enterococcus faecalis on June 7 with similar bacteria in the urine who was on IV antibiotics and transition to oral antibiotics, patient previously was treated for bladder cancer with chemotherapy, cystoscopy and prostatectomy with right nephro ureterectomy and total urethrectomy who is on chemotherapy now with nivolumab  and has steroids have been persistent UTI with bacteremia in April treated with Cipro  and amoxicillin .  Patient will also have another admission in May with stent placement and had Enterococcus faecalis bacteremia at the time from UTI.  He presented in early July with back pain at which point MRI of the lumbar spine showed no concern for infection.  Again had the Enterococcus faecalis in urine as well as blood.  Patient was evaluated with TTE and TEE but no vegetation.  Was discharged on oral amoxicillin  for 2 weeks after port was removed.  He has surveillance blood cultures with infectious disease Dr. Dennise with no apparent infection.  Today however MRI of the lumbar spine showed evidence of discitis and osteomyelitis at L4-L5 and L5-S1 levels.  There is also soft tissue phlegmon at the level with involvement of the right psoas muscle.  Patient has been admitted for further evaluation and treatment.   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  He remains afebrile, without leukocytosis. -Holding off further antibiotics until disc has been aspirated -IR  consulted, plan for L4-S1 disc aspiration with Dr. DELENA Balder today -ID following, appreciate recs -Continue to hold Eliquis    # Type 2 diabetes -CBG of 145 this morning -SSI with meals, CBG monitoring   # Paroxysmal atrial fibrillation -Rate controlled with HR in the 60s to 80s -Continue to hold Eliquis  pending IR aspiration of disc, INR 1.0 -Can likely resume Eliquis  tomorrow   # Chronic kidney disease stage III -Renal function stable, creatinine at baseline -Avoid nephrotoxic's agents   # Essential hypertension  -Blood pressure is controlled with SBP in the 110s to 130s -Continue to monitor   # Hyperlipidemia -Continue statin   # History of bladder cancer -Continue with oncology follow-up       Subjective: Evaluated laying comfortably in bed.  States he was able to sit on the chair a little yesterday but continues to have low back pain with any movement. Aware of plan for disc aspiration today.  He has no acute concerns.  Physical Exam: Vitals:   05/11/24 1955 05/11/24 2146 05/12/24 0406 05/12/24 0817  BP: 120/71 139/64 119/65 125/64  Pulse: 73 75 65 63  Resp: 16  16 18   Temp: 97.7 F (36.5 C) 98.7 F (37.1 C) 98.8 F (37.1 C) 98.2 F (36.8 C)  TempSrc: Oral Oral Oral Oral  SpO2: 100% 100% 99% 100%  Weight:      Height:       General: Pleasant, chronically ill elderly man laying in bed. No acute distress. HEENT: [place]/AT. Anicteric sclera CV: RRR. No murmurs, rubs, or gallops.  Pulmonary: Lungs CTAB. Normal effort. No wheezing or rales. Abdominal: Soft, nontender, nondistended. Normal bowel sounds. Urostomy in LLQ with  clear yellow urine in bag. MSK: Mild tenderness to palpation of the low back. Normal ROM. Palpable pedal pulses. Skin: Warm and dry. No obvious rash or lesions. Neuro: A&Ox3. Moves all extremities. Normal sensation to light touch. No focal deficit. Psych: Normal mood and affect  Data Reviewed:     Latest Ref Rng & Units 05/12/2024    5:32 AM  05/11/2024    4:46 AM 05/10/2024    2:23 PM  CBC  WBC 4.0 - 10.5 K/uL 7.6  8.6  8.3   Hemoglobin 13.0 - 17.0 g/dL 88.6  89.0  87.2   Hematocrit 39.0 - 52.0 % 34.2  33.1  38.9   Platelets 150 - 400 K/uL 180  202  245       Latest Ref Rng & Units 05/12/2024    5:32 AM 05/11/2024    4:46 AM 05/10/2024    2:23 PM  BMP  Glucose 70 - 99 mg/dL 881  817  774   BUN 8 - 23 mg/dL 15  14  16    Creatinine 0.61 - 1.24 mg/dL 8.94  8.84  8.77   Sodium 135 - 145 mmol/L 134  133  134   Potassium 3.5 - 5.1 mmol/L 3.9  3.8  4.8   Chloride 98 - 111 mmol/L 103  103  98   CO2 22 - 32 mmol/L 23  24  28    Calcium  8.9 - 10.3 mg/dL 8.8  8.5  9.4       Family Communication: No family at bedside  Disposition: Status is: Inpatient Remains inpatient appropriate because: Management of discitis, pending IR aspiration  Planned Discharge Destination: Pending    Time spent: 35 minutes  Author: Claretta CHRISTELLA Alderman, MD 05/12/2024 10:08 AM  For on call review [email or web address].

## 2024-05-12 NOTE — Plan of Care (Signed)
  Problem: Education: Goal: Knowledge of the procedure and recovery process will improve Outcome: Progressing   Problem: Bowel/Gastric: Goal: Gastrointestinal status for postoperative course will improve Outcome: Progressing   Problem: Pain Management: Goal: General experience of comfort will improve Outcome: Progressing   Problem: Skin Integrity: Goal: Demonstration of wound healing without infection will improve Outcome: Progressing   Problem: Urinary Elimination: Goal: Ability to avoid or minimize complications of infection will improve Outcome: Progressing Goal: Ability to achieve and maintain urine output will improve Outcome: Progressing Goal: Home care management will improve Outcome: Progressing   Problem: Education: Goal: Ability to describe self-care measures that may prevent or decrease complications (Diabetes Survival Skills Education) will improve Outcome: Progressing Goal: Individualized Educational Video(s) Outcome: Progressing   Problem: Coping: Goal: Ability to adjust to condition or change in health will improve Outcome: Progressing   Problem: Fluid Volume: Goal: Ability to maintain a balanced intake and output will improve Outcome: Progressing   Problem: Health Behavior/Discharge Planning: Goal: Ability to identify and utilize available resources and services will improve Outcome: Progressing Goal: Ability to manage health-related needs will improve Outcome: Progressing   Problem: Metabolic: Goal: Ability to maintain appropriate glucose levels will improve Outcome: Progressing   Problem: Nutritional: Goal: Maintenance of adequate nutrition will improve Outcome: Progressing Goal: Progress toward achieving an optimal weight will improve Outcome: Progressing   Problem: Skin Integrity: Goal: Risk for impaired skin integrity will decrease Outcome: Progressing   Problem: Tissue Perfusion: Goal: Adequacy of tissue perfusion will improve Outcome:  Progressing   Problem: Education: Goal: Knowledge of General Education information will improve Description: Including pain rating scale, medication(s)/side effects and non-pharmacologic comfort measures Outcome: Progressing   Problem: Health Behavior/Discharge Planning: Goal: Ability to manage health-related needs will improve Outcome: Progressing   Problem: Clinical Measurements: Goal: Ability to maintain clinical measurements within normal limits will improve Outcome: Progressing Goal: Will remain free from infection Outcome: Progressing Goal: Diagnostic test results will improve Outcome: Progressing Goal: Respiratory complications will improve Outcome: Progressing Goal: Cardiovascular complication will be avoided Outcome: Progressing   Problem: Activity: Goal: Risk for activity intolerance will decrease Outcome: Progressing   Problem: Nutrition: Goal: Adequate nutrition will be maintained Outcome: Progressing   Problem: Coping: Goal: Level of anxiety will decrease Outcome: Progressing   Problem: Elimination: Goal: Will not experience complications related to bowel motility Outcome: Progressing Goal: Will not experience complications related to urinary retention Outcome: Progressing   Problem: Pain Managment: Goal: General experience of comfort will improve and/or be controlled Outcome: Progressing   Problem: Safety: Goal: Ability to remain free from injury will improve Outcome: Progressing   Problem: Skin Integrity: Goal: Risk for impaired skin integrity will decrease Outcome: Progressing

## 2024-05-12 NOTE — Procedures (Signed)
 Interventional Radiology Procedure:   Indications: Discitis L4-L5  Procedure: Disc aspiration with fluoroscopy  Findings: 18 gauge needled advanced into L4-L5 disc from right approach.  1 ml of pink cloudy fluid aspirated. In addition, a piece of tissue was collected.   Complications: None     EBL: Minimal  Plan: Fluid and tissue sent for culture.   Kaitelyn Jamison R. Philip, MD  Pager: [phone]

## 2024-05-13 ENCOUNTER — Telehealth: Payer: Self-pay | Admitting: *Deleted

## 2024-05-13 DIAGNOSIS — B9689 Other specified bacterial agents as the cause of diseases classified elsewhere: Secondary | ICD-10-CM | POA: Diagnosis not present

## 2024-05-13 DIAGNOSIS — R7881 Bacteremia: Secondary | ICD-10-CM

## 2024-05-13 DIAGNOSIS — G061 Intraspinal abscess and granuloma: Secondary | ICD-10-CM | POA: Diagnosis not present

## 2024-05-13 DIAGNOSIS — M4647 Discitis, unspecified, lumbosacral region: Secondary | ICD-10-CM | POA: Diagnosis not present

## 2024-05-13 DIAGNOSIS — M4627 Osteomyelitis of vertebra, lumbosacral region: Secondary | ICD-10-CM | POA: Diagnosis not present

## 2024-05-13 LAB — CBC
HCT: 33.6 % — ABNORMAL LOW (ref 39.0–52.0)
Hemoglobin: 11.2 g/dL — ABNORMAL LOW (ref 13.0–17.0)
MCH: 27.5 pg (ref 26.0–34.0)
MCHC: 33.3 g/dL (ref 30.0–36.0)
MCV: 82.4 fL (ref 80.0–100.0)
Platelets: 180 K/uL (ref 150–400)
RBC: 4.08 MIL/uL — ABNORMAL LOW (ref 4.22–5.81)
RDW: 14.6 % (ref 11.5–15.5)
WBC: 7.4 K/uL (ref 4.0–10.5)
nRBC: 0 % (ref 0.0–0.2)

## 2024-05-13 LAB — BASIC METABOLIC PANEL WITH GFR
Anion gap: 8 (ref 5–15)
BUN: 15 mg/dL (ref 8–23)
CO2: 25 mmol/L (ref 22–32)
Calcium: 8.7 mg/dL — ABNORMAL LOW (ref 8.9–10.3)
Chloride: 99 mmol/L (ref 98–111)
Creatinine, Ser: 1.08 mg/dL (ref 0.61–1.24)
GFR, Estimated: >60 mL/min (ref >60)
Glucose, Bld: 136 mg/dL — ABNORMAL HIGH (ref 70–99)
Potassium: 3.9 mmol/L (ref 3.5–5.1)
Sodium: 132 mmol/L — ABNORMAL LOW (ref 135–145)

## 2024-05-13 LAB — GLUCOSE, CAPILLARY
Glucose-Capillary: 160 mg/dL — ABNORMAL HIGH (ref 70–99)
Glucose-Capillary: 193 mg/dL — ABNORMAL HIGH (ref 70–99)
Glucose-Capillary: 139 mg/dL — ABNORMAL HIGH (ref 70–99)
Glucose-Capillary: 253 mg/dL — ABNORMAL HIGH (ref 70–99)

## 2024-05-13 MED ORDER — DAPTOMYCIN-SODIUM CHLORIDE 700-0.9 MG/100ML-% IV SOLN
8.0000 mg/kg | Freq: Every day | INTRAVENOUS | Status: DC
  Administered 2024-05-13 – 2024-05-14 (×2): 700 mg via INTRAVENOUS
  Filled 2024-05-13 (×3): qty 100

## 2024-05-13 MED ORDER — POLYETHYLENE GLYCOL 3350 17 G PO PACK
17.0000 g | PACK | Freq: Every day | ORAL | Status: DC | PRN
  Administered 2024-05-13: 17 g via ORAL

## 2024-05-13 MED ORDER — SODIUM CHLORIDE 0.9 % IV SOLN
2.0000 g | Freq: Three times a day (TID) | INTRAVENOUS | Status: DC
  Administered 2024-05-13 – 2024-05-15 (×6): 2 g via INTRAVENOUS
  Filled 2024-05-13 (×6): qty 12.5

## 2024-05-13 MED ORDER — SENNA 8.6 MG PO TABS
1.0000 | ORAL_TABLET | Freq: Every day | ORAL | Status: DC | PRN
  Administered 2024-05-14: 8.6 mg via ORAL
  Filled 2024-05-13: qty 1

## 2024-05-13 MED ORDER — APIXABAN 5 MG PO TABS
5.0000 mg | ORAL_TABLET | Freq: Two times a day (BID) | ORAL | Status: DC
  Administered 2024-05-13 – 2024-05-15 (×5): 5 mg via ORAL
  Filled 2024-05-13 (×5): qty 1

## 2024-05-13 NOTE — TOC Initial Note (Addendum)
 Transition of Care [facility]) - Initial/Assessment Note    Patient Details  Name: Robert [name] MRN: [id number] Date of Birth: [birth year]  Transition of Care [facility] CM/SW Contact:    Roxie KANDICE Stain, RN Phone Number: 05/13/2024, 10:06 AM  Clinical Narrative:                 Spoke to patient regarding transition needs.  Patient lives with wife who has dementia. Wife has caregivers.  Patient has walker and cane. Patient doesn't have history of home health. Patient has neighbors who can assist with transportation.  Patient has ID and neurosurgeon consult.  Address, Phone number and PCP verified. ICM following.  Expected Discharge Plan: Home w Home Health Services Barriers to Discharge: Continued Medical Work up   Patient Goals and CMS Choice Patient states their goals for this hospitalization and ongoing recovery are:: get better and not to have to come back to hospital regarding the same issue          Expected Discharge Plan and Services   Discharge Planning Services: CM Consult   Living arrangements for the past 2 months: Single Family Home                                      Prior Living Arrangements/Services Living arrangements for the past 2 months: Single Family Home Lives with:: Spouse, Other (Comment) (caregivers for wife) Patient language and need for interpreter reviewed:: Yes Do you feel safe going back to the place where you live?: Yes      Need for Family Participation in Patient Care: Yes (Comment) Care giver support system in place?: Yes (comment) Current home services: DME (walker, cane) Criminal Activity/Legal Involvement Pertinent to Current Situation/Hospitalization: No - Comment as needed  Activities of Daily Living   ADL Screening (condition at time of admission) Independently performs ADLs?: Yes (appropriate for developmental age) Is the patient deaf or have difficulty hearing?: No Does the patient have difficulty seeing, even  when wearing glasses/contacts?: No Does the patient have difficulty concentrating, remembering, or making decisions?: No  Permission Sought/Granted                  Emotional Assessment Appearance:: Appears stated age Attitude/Demeanor/Rapport: Engaged Affect (typically observed): Accepting Orientation: : Oriented to Self, Oriented to Place, Oriented to  Time, Oriented to Situation Alcohol  / Substance Use: Not Applicable Psych Involvement: No (comment)  Admission diagnosis:  Discitis of lumbar region [M46.46] Diskitis [M46.40] Acute osteomyelitis of lumbar spine [facility]) [M46.26] Patient Active Problem List   Diagnosis Date Noted   Diskitis 05/10/2024   Low back pain 04/17/2024   Lumbar radiculopathy 03/28/2024   Hyponatremia 03/28/2024   CKD (chronic kidney disease), stage III (HCC) 03/28/2024   H/O left nephrectomy 03/19/2024   Neuropathy 03/19/2024   AKI (acute kidney injury) (HCC) 03/01/2024   Serratia marcescens infection 03/01/2024   Chills 02/29/2024   Medication management 02/29/2024   Hydronephrosis of left kidney 02/05/2024   Complicated UTI (urinary tract infection) 02/05/2024   Bacteremia 02/04/2024   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  10/09/2023   Hypomagnesemia 07/03/2023   Port-A-Cath in place 06/09/2023   Elevated PSA measurement 02/28/2023   Preop examination 02/27/2023   Osteoarthritis of right hip 06/29/2022   Bladder cancer (HCC) 01/31/2022   Primary osteoarthritis of right hip 10/19/2021   Long term current use of anticoagulant -Eliquis  03/12/2019   Paroxysmal A-fib (HCC) 03/12/2019  Overweight (BMI 25.0-29.9) 01/03/2019   Type 2 diabetes mellitus without complication, without long-term current use of insulin  (HCC) 07/23/2018   BPH associated with nocturia 03/01/2017   Irregular heart rate 06/23/2011   Hx of colonic polyps    ERECTILE DYSFUNCTION 12/19/2008   SKIN CANCER, HX OF 12/19/2008   Hyperlipidemia 08/21/2007   Essential hypertension 08/21/2007    PCP:  Garald Karlynn GAILS, MD Pharmacy:   [facility] [phone] [address], [place] - [address] [place] [address] Phone: [phone] Fax: [phone]     Social Drivers of Health (SDOH) Social History: SDOH Screenings   Food Insecurity: No Food Insecurity (05/11/2024)  Housing: Low Risk  (05/11/2024)  Transportation Needs: No Transportation Needs (05/11/2024)  Utilities: Not At Risk (05/11/2024)  Alcohol  Screen: Low Risk  (04/08/2024)  Depression (PHQ2-9): Low Risk  (05/09/2024)  Financial Resource Strain: Low Risk  (04/08/2024)  Physical Activity: Inactive (04/08/2024)  Social Connections: Moderately Integrated (05/11/2024)  Recent Concern: Social Connections - Moderately Isolated (03/28/2024)  Stress: Stress Concern Present (04/08/2024)  Tobacco Use: High Risk (05/10/2024)  Health Literacy: Adequate Health Literacy (04/08/2024)   SDOH Interventions:     Readmission Risk Interventions    04/01/2024    2:58 PM 02/07/2024    2:07 PM 12/10/2023    4:50 PM  Readmission Risk Prevention Plan  Transportation Screening Complete Complete Complete  PCP or Specialist Appt within 5-7 Days   Complete  PCP or Specialist Appt within 3-5 Days Complete Complete   Home Care Screening   Complete  Medication Review (RN CM)   Complete  HRI or Home Care Consult Complete Complete   Social Work Consult for Recovery Care Planning/Counseling Complete Complete   Palliative Care Screening Not Applicable Not Applicable   Medication Review Oceanographer) Complete Referral to Pharmacy

## 2024-05-13 NOTE — Progress Notes (Signed)
 [facility]  Date of Admission:  Robert [name]     Reason for Follow Up: Diskitis  Total days of antibiotics 1         ASSESSMENT:  Robert Robert [name] is a 73 year old Caucasian male with recent admission for Enterococcus faecalis UTI/bacteremia in June 2025 and Serratia marcescens UTI/bacteremia April 2025 presenting with worsening back pain and found to have L4-S1 osteomyelitis/discitis and epidural phlegmon.  Status post IR aspiration with Gram stain showing gram-positive cocci and Robert [name] pending.  Robert Robert [name] without growth today.  Continue to monitor blood Robert [name] and aspiration Robert [name] with adjustments in antibiotics as indicated.  In the interim we will start broad-spectrum coverage with daptomycin  and cefepime .  Therapeutic drug monitoring of CK levels while on daptomycin .  Discussed plan for prolonged course of antibiotics.  Continue standard/universal precautions.  Remaining medical and supportive care per internal medicine.  PLAN:  Start broad-spectrum coverage with daptomycin  and cefepime . Monitor blood Robert [name] and aspiration specimens for organisms and adjust antibiotics accordingly. Therapeutic drug monitoring of CK levels. Standard/universal precautions. Remaining medical and supportive care per internal medicine.  Principal Problem:   Diskitis Active Problems:   Hyperlipidemia   Essential hypertension   Type 2 diabetes mellitus without complication, without long-term current use of insulin  (HCC)   Paroxysmal A-fib (HCC)   Bladder cancer (HCC)   Bacteremia   CKD (chronic kidney disease), stage III (HCC)    apixaban   5 mg Oral BID   atorvastatin   40 mg Oral QHS   B-complex with vitamin C  1 tablet Oral Once per day on Monday Wednesday Friday   cholecalciferol   5,000 Units Oral Once per day on Monday Wednesday Friday   insulin  aspart  0-15 Units Subcutaneous TID WC   insulin  aspart  0-5 Units Subcutaneous QHS    metoprolol  succinate  25 mg Oral QHS    SUBJECTIVE:  Afebrile overnight with no acute events.     Allergies  Allergen Reactions   Lisinopril  Swelling and Other (See Comments)    Tongue and throat became swollen   Lyrica  [Pregabalin ] Swelling and Other (See Comments)    Patient noticed swelling of the ankles and feet when taking this, so he stopped using it.     Review of Systems: Review of Systems  Constitutional:  Negative for chills, fever and weight loss.  Respiratory:  Negative for cough, shortness of breath and wheezing.   Cardiovascular:  Negative for chest pain and leg swelling.  Gastrointestinal:  Negative for abdominal pain, constipation, diarrhea, nausea and vomiting.  Musculoskeletal:  Positive for back pain.  Skin:  Negative for rash.      OBJECTIVE: Vitals:   05/12/24 1924 05/12/24 2223 05/13/24 0405 05/13/24 0726  BP: 116/62 116/62 124/66 120/77  Pulse: 77 77 67 65  Resp: 16  16 17   Temp: 99.1 F (37.3 C)  98.6 F (37 C) 97.7 F (36.5 C)  TempSrc: Oral  Oral Oral  SpO2: 98%  98% 98%  Weight:      Height:       Body mass index is 23.75 kg/m.  Physical Exam Constitutional:      General: He is not in acute distress.    Appearance: He is well-developed.  Cardiovascular:     Rate and Rhythm: Normal rate and regular rhythm.     Heart sounds: Normal heart sounds.  Pulmonary:     [place]: Pulmonary effort is normal.     Breath sounds:  Normal breath sounds.  Skin:    General: Skin is warm and dry.  Neurological:     Mental Status: He is alert and oriented to person, place, and time.  Psychiatric:        Mood and Affect: Mood normal.     Lab Results Lab Results  Component Value Date   WBC 7.4 05/13/2024   HGB 11.2 (L) 05/13/2024   HCT 33.6 (L) 05/13/2024   MCV 82.4 05/13/2024   PLT 180 05/13/2024    Lab Results  Component Value Date   CREATININE 1.08 05/13/2024   BUN 15 05/13/2024   NA 132 (L) 05/13/2024   K 3.9 05/13/2024   CL 99  05/13/2024   CO2 25 05/13/2024    Lab Results  Component Value Date   ALT 12 05/11/2024   AST 14 (L) 05/11/2024   ALKPHOS 75 05/11/2024   BILITOT 0.5 05/11/2024     Microbiology: Recent Results (from the past 240 hours)  Blood culture (routine x 2)     Status: None (Preliminary result)   Collection Time: 05/10/24  6:50 PM   Specimen: BLOOD  Result Value Ref Range Status   Specimen Description BLOOD LEFT ANTECUBITAL  Final   Special Requests   Final    BOTTLES DRAWN AEROBIC AND ANAEROBIC Blood Culture adequate volume   Culture   Final    NO GROWTH 3 DAYS Performed at [facility], [address] [place], [place] [address]    Report Status PENDING  Incomplete  Blood culture (routine x 2)     Status: None (Preliminary result)   Collection Time: 05/10/24  7:05 PM   Specimen: BLOOD LEFT FOREARM  Result Value Ref Range Status   Specimen Description BLOOD LEFT FOREARM  Final   Special Requests   Final    BOTTLES DRAWN AEROBIC AND ANAEROBIC Blood Culture adequate volume   Culture   Final    NO GROWTH 3 DAYS Performed at [facility], [address], [place], [place] [address]    Report Status PENDING  Incomplete  Body fluid culture w Gram Stain     Status: None (Preliminary result)   Collection Time: 05/12/24 12:20 PM   Specimen: Other Source; Body Fluid  Result Value Ref Range Status   Specimen Description OTHER  Final   Special Requests IDISC  Final   Gram Stain   Final    RARE WBC PRESENT, PREDOMINANTLY PMN NO ORGANISMS SEEN Performed at [facility], [address], [place], [place] [address]    Culture PENDING  Incomplete   Report Status PENDING  Incomplete  Aerobic/Anaerobic Culture w Gram Stain (surgical/deep wound)     Status: None (Preliminary result)   Collection Time: 05/12/24 12:21 PM   Specimen: Other Source; Tissue  Result Value Ref Range Status   Specimen Description OTHER  Final   Special Requests IDISC  Final   Gram Stain   Final     FEW WBC PRESENT, PREDOMINANTLY PMN RARE GRAM POSITIVE COCCI Performed at [facility], [address], [place], [place] [address]    Culture PENDING  Incomplete   Report Status PENDING  Incomplete    I have personally spent 27 minutes involved in face-to-face and non-face-to-face activities for this patient on the day of the visit. Professional time spent includes the following activities: preparing to see the patient (review of tests), performing a medically appropriate examination, ordering medications, communicating with other health care professionals,  documenting clinical information in the EMR, communicating results and counseling patient regarding medication and plan of care, and care coordination.    Greg Thurley Francesconi, NP [facility]  05/13/2024  1:52 PM

## 2024-05-13 NOTE — Telephone Encounter (Signed)
 I LVM for [name] to call back with her concerns and questions.

## 2024-05-13 NOTE — Progress Notes (Signed)
 S/p disc aspiration 7/20. Ok to resume apixaban  per Dr. Philip.  Sergio Batch, PharmD, BCIDP, AAHIVP, CPP Infectious Disease Pharmacist 05/13/2024 10:28 AM

## 2024-05-13 NOTE — Care Management Important Message (Signed)
 Important Message  Patient Details  Name: Robert [name] MRN: [id number] Date of Birth: Nov 15, 1950   Important Message Given:  Yes - Medicare IM     Claretta Deed 05/13/2024, 4:30 PM

## 2024-05-13 NOTE — Progress Notes (Signed)
 Progress Note   Patient: Robert [name] FMW:[id number] DOB: 1951/05/13 DOA: 05/10/2024     3 DOS: the patient was seen and examined on 05/13/2024   Brief hospital course: TYON CERASOLI II is a 73 y.o. male with medical history significant of paroxysmal atrial fibrillation on Eliquis , chronic kidney disease stage III, type 2 diabetes, essential hypertension, hyperlipidemia, recent bacteremia with Bacteroides beta-lactamase positive, Enterococcus faecalis on June 7 with similar bacteria in the urine who was on IV antibiotics and transition to oral antibiotics, patient previously was treated for bladder cancer with chemotherapy, cystoscopy and prostatectomy with right nephro ureterectomy and total urethrectomy who is on chemotherapy now with nivolumab  and has steroids have been persistent UTI with bacteremia in April treated with Cipro  and amoxicillin .  Patient will also have another admission in May with stent placement and had Enterococcus faecalis bacteremia at the time from UTI.  He presented in early July with back pain at which point MRI of the lumbar spine showed no concern for infection.  Again had the Enterococcus faecalis in urine as well as blood.  Patient was evaluated with TTE and TEE but no vegetation.  Was discharged on oral amoxicillin  for 2 weeks after port was removed.  He has surveillance blood cultures with infectious disease Dr. Dennise with no apparent infection.  Today however MRI of the lumbar spine showed evidence of discitis and osteomyelitis at L4-L5 and L5-S1 levels.  There is also soft tissue phlegmon at the level with involvement of the right psoas muscle.  Patient has been admitted for further evaluation and treatment.   Assessment and Plan: Acute discitis and OM of lumbar spine  - s/p disc aspiration 7/20 - IV cefepime  2 g q8hr  - IV daptomycin700 mg daily  - Vit D3 5000 units PO 3 times weekly - IV morphine  2 mg q2 hr PRN  - Oxycodone  PRN  - Miralax  PRN  -  Senna daily PRN  - Lidoderm  5% patch  - Robaxin  500 mg PO q8hr PRN   DM2 - Insulin  novolog  ACHS  PAF - Eliquis  5 mg PO bid - Toprol  XL 25 mg PO daily    HTN - Toprol  XL 25 mg PO daily   HLD  - Lipitor 40 mg PO at bedtime   Hx of bladder CA  - Outpt followup  Subjective: Pt seen and examined at the bedside. Continues w/ IV antibx. Constipation noted today and miralax  and senna were ordered. Eliquis  resumed today with IR approval.  Physical Exam: Vitals:   05/12/24 1924 05/12/24 2223 05/13/24 0405 05/13/24 0726  BP: 116/62 116/62 124/66 120/77  Pulse: 77 77 67 65  Resp: 16  16 17   Temp: 99.1 F (37.3 C)  98.6 F (37 C) 97.7 F (36.5 C)  TempSrc: Oral  Oral Oral  SpO2: 98%  98% 98%  Weight:      Height:       Physical Exam HENT:     Head: Normocephalic.     Mouth/Throat:     Mouth: Mucous membranes are moist.  Cardiovascular:     Rate and Rhythm: Normal rate.  Pulmonary:     [place]: Pulmonary effort is normal.  Abdominal:     Palpations: Abdomen is soft.  Skin:    General: Skin is warm.  Neurological:     Mental Status: He is alert. Mental status is at baseline.  Psychiatric:        Mood and Affect: Mood normal.  Disposition: Status is: Inpatient Remains inpatient appropriate because: IV antibx  Planned Discharge Destination: Barriers to discharge: IV antibx as above     Time spent: 35 minutes  Author: ATLEE ABERNETHY , MD 05/13/2024 11:53 AM  For on call review [email or web address].

## 2024-05-14 DIAGNOSIS — M4627 Osteomyelitis of vertebra, lumbosacral region: Secondary | ICD-10-CM | POA: Diagnosis not present

## 2024-05-14 DIAGNOSIS — G061 Intraspinal abscess and granuloma: Secondary | ICD-10-CM | POA: Diagnosis not present

## 2024-05-14 DIAGNOSIS — B9689 Other specified bacterial agents as the cause of diseases classified elsewhere: Secondary | ICD-10-CM | POA: Diagnosis not present

## 2024-05-14 DIAGNOSIS — M4647 Discitis, unspecified, lumbosacral region: Secondary | ICD-10-CM | POA: Diagnosis not present

## 2024-05-14 LAB — GLUCOSE, CAPILLARY
Glucose-Capillary: 147 mg/dL — ABNORMAL HIGH (ref 70–99)
Glucose-Capillary: 230 mg/dL — ABNORMAL HIGH (ref 70–99)
Glucose-Capillary: 130 mg/dL — ABNORMAL HIGH (ref 70–99)
Glucose-Capillary: 133 mg/dL — ABNORMAL HIGH (ref 70–99)

## 2024-05-14 LAB — CK: Total CK: 18 U/L — ABNORMAL LOW (ref 49–397)

## 2024-05-14 NOTE — Progress Notes (Addendum)
 PROGRESS NOTE    Robert [name]  FMW:[id number] DOB: [birth year] DOA: 05/10/2024 PCP: Garald Karlynn GAILS, MD   Brief Narrative:  73 y.o. male with medical history significant of paroxysmal atrial fibrillation on Eliquis , chronic kidney disease stage III, type 2 diabetes, essential hypertension, hyperlipidemia, recent bacteremia with Bacteroides beta-lactamase positive, Enterococcus faecalis on June 7 with similar bacteria in the urine who was on IV antibiotics and transition to oral antibiotics, patient previously was treated for bladder cancer with chemotherapy, cystoscopy and prostatectomy with right nephro ureterectomy and total urethrectomy who is on chemotherapy now with nivolumab  and has steroids have been persistent UTI with bacteremia in April treated with Cipro  and amoxicillin .  Patient will also have another admission in May with stent placement and had Enterococcus faecalis bacteremia at the time from UTI.  He presented in early July with back pain at which point MRI of the lumbar spine showed no concern for infection.  Again had the Enterococcus faecalis in urine as well as blood.  Patient was evaluated with TTE and TEE but no vegetation.  Was discharged on oral amoxicillin  for 2 weeks after port was removed.  He has surveillance blood cultures with infectious disease Dr. Dennise with no apparent infection.  MRI of the lumbar spine showed evidence of discitis and osteomyelitis at L4-L5 and L5-S1 levels.  There is also soft tissue phlegmon at the level with involvement of the right psoas muscle. He underwent disc aspiration on 7/20, ID on board and he is on IV antibiotics.   Assessment & Plan:   Acute L4-L5 discitis and Osteomyelitis with epidural phlegmon,POA: - s/p IR disc aspiration 7/20. This was done off of antibiotics. - Blood cultures 7/18 showed NGTD -Aspirate culture is growing Gram positive cocci - IV cefepime  2 g q8hr  - IV daptomycin700 mg daily  - Vit D3 5000 units  PO 3 times weekly - IV morphine  2 mg q2 hr PRN  - Oxycodone  PRN  - Miralax  and senna PRN for constipation - Lidoderm  5% patch  - Robaxin  500 mg PO q8hr PRN    Type 2 diabetes mellitus, non insulin  dependent,POA: - Insulin  novolog  ACHS -Patient is on ozempic  at home   Long standing persistent atrial fibrillation,POA: - Eliquis  5 mg PO bid - Toprol  XL 25 mg PO daily     Hypertension: - On Toprol  XL 25 mg PO daily    Hyperlipidemia  - On Lipitor 40 mg PO at bedtime    Hx of bladder CA  - Outpt follow up with urology  Disposition: Lives at home with his wife. HHS on discharge.   DVT prophylaxis: SCDs Start: 05/11/24 0239 apixaban  (ELIQUIS ) tablet 5 mg     Code Status: Full Code Family Communication:   Status is: Inpatient Remains inpatient appropriate because: acute discitis/OM    Subjective:  He feels better. Back pain is improved. He is constipated and received miralax  yesterday and will be getting senna today. He told he can walk no with the help of a walker. Lives at home with his wife.  Examination:  General exam: Appears calm and comfortable  Respiratory system: Clear to auscultation. Respiratory effort normal. Cardiovascular system: S1 & S2 heard, RRR. No JVD, murmurs, rubs, gallops or clicks. No pedal edema. Gastrointestinal system: Abdomen is nondistended, soft and nontender. No organomegaly or masses felt. Normal bowel sounds heard. Central nervous system: Alert and oriented. No focal neurological deficits. Extremities: Symmetric 5 x 5 power. Skin: No rashes, lesions or ulcers Psychiatry:  Judgement and insight appear normal. Mood & affect appropriate.      Diet Orders (From admission, onward)     Start     Ordered   05/12/24 1231  Diet Carb Modified Fluid consistency: Thin; Room service appropriate? Yes  Diet effective now       Question Answer Comment  Diet-HS Snack? Nothing   Calorie Level Medium 1600-2000   Fluid consistency: Thin   Room service  appropriate? Yes      05/12/24 1231            Objective: Vitals:   05/13/24 2040 05/13/24 2208 05/14/24 0422 05/14/24 0742  BP: 131/65 131/65 106/73 115/65  Pulse: 77 77 63 60  Resp: 16  16   Temp: 98.5 F (36.9 C)  98.1 F (36.7 C) 97.7 F (36.5 C)  TempSrc: Oral  Oral Oral  SpO2: 100%  100% 100%  Weight:      Height:        Intake/Output Summary (Last 24 hours) at 05/14/2024 0927 Last data filed at 05/14/2024 0501 Gross per 24 hour  Intake 760 ml  Output 2001 ml  Net -1241 ml   Filed Weights   05/10/24 1431  Weight: 86.2 kg    Scheduled Meds:  apixaban   5 mg Oral BID   atorvastatin   40 mg Oral QHS   B-complex with vitamin C  1 tablet Oral Once per day on Monday Wednesday Friday   cholecalciferol   5,000 Units Oral Once per day on Monday Wednesday Friday   insulin  aspart  0-15 Units Subcutaneous TID WC   insulin  aspart  0-5 Units Subcutaneous QHS   metoprolol  succinate  25 mg Oral QHS   Continuous Infusions:  ceFEPime  (MAXIPIME ) IV 2 g (05/14/24 0501)   DAPTOmycin  700 mg (05/13/24 1640)    Nutritional status     Body mass index is 23.75 kg/m.  Data Reviewed:   CBC: Recent Labs  Lab 05/09/24 1327 05/10/24 1423 05/11/24 0446 05/12/24 0532 05/13/24 0622  WBC 9.9 8.3 8.6 7.6 7.4  NEUTROABS 7.2 5.7  --   --   --   HGB 12.6* 12.7* 10.9* 11.3* 11.2*  HCT 37.4* 38.9* 33.1* 34.2* 33.6*  MCV 80.4 82.9 82.5 81.6 82.4  PLT 248 245 202 180 180   Basic Metabolic Panel: Recent Labs  Lab 05/09/24 1327 05/10/24 1423 05/11/24 0446 05/12/24 0532 05/13/24 0622  NA 134* 134* 133* 134* 132*  K 4.3 4.8 3.8 3.9 3.9  CL 99 98 103 103 99  CO2 28 28 24 23 25   GLUCOSE 250* 225* 182* 118* 136*  BUN 19 16 14 15 15   CREATININE 1.24 1.22 1.15 1.05 1.08  CALCIUM  9.4 9.4 8.5* 8.8* 8.7*   GFR: Estimated Creatinine Clearance: 72.8 mL/min (by C-G formula based on SCr of 1.08 mg/dL). Liver Function Tests: Recent Labs  Lab 05/09/24 1327 05/11/24 0446  AST  13* 14*  ALT 13 12  ALKPHOS 96 75  BILITOT 0.4 0.5  PROT 7.3 6.0*  ALBUMIN 3.8 2.6*   No results for input(s): LIPASE, AMYLASE in the last 168 hours. No results for input(s): AMMONIA in the last 168 hours. Coagulation Profile: Recent Labs  Lab 05/11/24 1211  INR 1.0   Cardiac Enzymes: Recent Labs  Lab 05/14/24 0738  CKTOTAL 18*   BNP (last 3 results) No results for input(s): PROBNP in the last 8760 hours. HbA1C: No results for input(s): HGBA1C in the last 72 hours. CBG: Recent Labs  Lab 05/13/24  0729 05/13/24 1205 05/13/24 1642 05/13/24 2058 05/14/24 0743  GLUCAP 160* 193* 139* 253* 147*   Lipid Profile: No results for input(s): CHOL, HDL, LDLCALC, TRIG, CHOLHDL, LDLDIRECT in the last 72 hours. Thyroid  Function Tests: No results for input(s): TSH, T4TOTAL, FREET4, T3FREE, THYROIDAB in the last 72 hours. Anemia Panel: No results for input(s): VITAMINB12, FOLATE, FERRITIN, TIBC, IRON, RETICCTPCT in the last 72 hours. Sepsis Labs: No results for input(s): PROCALCITON, LATICACIDVEN in the last 168 hours.  Recent Results (from the past 240 hours)  Blood culture (routine x 2)     Status: None (Preliminary result)   Collection Time: 05/10/24  6:50 PM   Specimen: BLOOD  Result Value Ref Range Status   Specimen Description BLOOD LEFT ANTECUBITAL  Final   Special Requests   Final    BOTTLES DRAWN AEROBIC AND ANAEROBIC Blood Culture adequate volume   Culture   Final    NO GROWTH 4 DAYS Performed at [facility], [address], [place], [place] [address]    Report Status PENDING  Incomplete  Blood culture (routine x 2)     Status: None (Preliminary result)   Collection Time: 05/10/24  7:05 PM   Specimen: BLOOD LEFT FOREARM  Result Value Ref Range Status   Specimen Description BLOOD LEFT FOREARM  Final   Special Requests   Final    BOTTLES DRAWN AEROBIC AND ANAEROBIC Blood Culture adequate volume   Culture    Final    NO GROWTH 4 DAYS Performed at [facility], [address], [place], [place] [address]    Report Status PENDING  Incomplete  Body fluid culture w Gram Stain     Status: None (Preliminary result)   Collection Time: 05/12/24 12:20 PM   Specimen: Other Source; Body Fluid  Result Value Ref Range Status   Specimen Description OTHER  Final   Special Requests IDISC  Final   Gram Stain   Final    RARE WBC PRESENT, PREDOMINANTLY PMN NO ORGANISMS SEEN Performed at [facility], [address], [place], [place] [address]    Culture PENDING  Incomplete   Report Status PENDING  Incomplete  Aerobic/Anaerobic Culture w Gram Stain (surgical/deep wound)     Status: None (Preliminary result)   Collection Time: 05/12/24 12:21 PM   Specimen: Other Source; Tissue  Result Value Ref Range Status   Specimen Description OTHER  Final   Special Requests IDISC  Final   Gram Stain   Final    FEW WBC PRESENT, PREDOMINANTLY PMN RARE GRAM POSITIVE COCCI Performed at [facility], [address] [place], [place] [address]    Culture PENDING  Incomplete   Report Status PENDING  Incomplete         Radiology Studies: IR LUMBAR DISC ASPIRATION W/IMG GUIDE Result Date: 05/12/2024 INDICATION: MRI suggests discitis at L4-L5. Patient presents for image guided disc aspiration. EXAM: DISC ASPIRATION AT L4-L5 USING FLUOROSCOPIC GUIDANCE MEDICATIONS: Moderate sedation ANESTHESIA/SEDATION: Moderate (conscious) sedation was employed during this procedure. A total of Versed  2 mg and Fentanyl  100 mcg was administered intravenously by the radiology nurse. Total intra-service moderate Sedation Time: 14 minutes. The patient's level of consciousness and vital signs were monitored continuously by radiology nursing throughout the procedure under my direct supervision. COMPLICATIONS: None Insert FLUOROSCOPY TIME:  Radiation Exposure Index (as provided by the fluoroscopic device): 24 mGy Kerma PROCEDURE:  Informed written consent was obtained from the patient after a thorough discussion of  the procedural risks, benefits and alternatives. All questions were addressed. Maximal Sterile Barrier Technique was utilized including caps, mask, sterile gowns, sterile gloves, sterile drape, hand hygiene and skin antiseptic. A timeout was performed prior to the initiation of the procedure. Patient was placed prone. Back was prepped and draped in sterile fashion. L4-L5 disc space was identified with fluoroscopy. Right-sided approach was selected. The right side of the back was anesthetized using 1% lidocaine . Small incision was made. Using fluoroscopic guidance, an 18 gauge trocar needle was directed into the right side of the L4-L5 disc space. Needle placement confirmed with fluoroscopy. 1 mL of cloudy pink fluid was aspirated. In addition, small amount of soft tissue was also attached to the needle as it was removed. Fluid and tissue was sent for culture. Bandage placed over the puncture site. FINDINGS: Needle placement confirmed in the L4-L5 disc. IMPRESSION: Successful fluoroscopic guided disc aspiration at L4-L5. Electronically Signed   By: Juliene Balder M.D.   On: 05/12/2024 15:07        LOS: 4 days   Time spent= 43 mins    Deliliah Room, MD [facility]  If 7PM-7AM, please contact night-coverage  05/14/2024, 9:27 AM

## 2024-05-14 NOTE — Plan of Care (Signed)
 Problem: Education: Goal: Knowledge of the procedure and recovery process will improve 05/14/2024 0443 by Vinie Ginny PARAS, RN Outcome: Progressing 05/14/2024 0443 by Vinie Ginny PARAS, RN Outcome: Progressing   Problem: Bowel/Gastric: Goal: Gastrointestinal status for postoperative course will improve 05/14/2024 0443 by Vinie Ginny PARAS, RN Outcome: Progressing 05/14/2024 0443 by Vinie Ginny PARAS, RN Outcome: Progressing   Problem: Pain Management: Goal: General experience of comfort will improve 05/14/2024 0443 by Vinie Ginny PARAS, RN Outcome: Progressing 05/14/2024 0443 by Vinie Ginny PARAS, RN Outcome: Progressing   Problem: Skin Integrity: Goal: Demonstration of wound healing without infection will improve 05/14/2024 0443 by Vinie Ginny PARAS, RN Outcome: Progressing 05/14/2024 0443 by Vinie Ginny PARAS, RN Outcome: Progressing   Problem: Urinary Elimination: Goal: Ability to avoid or minimize complications of infection will improve 05/14/2024 0443 by Vinie Ginny PARAS, RN Outcome: Progressing 05/14/2024 0443 by Vinie Ginny PARAS, RN Outcome: Progressing Goal: Ability to achieve and maintain urine output will improve 05/14/2024 0443 by Vinie Ginny PARAS, RN Outcome: Progressing 05/14/2024 0443 by Vinie Ginny PARAS, RN Outcome: Progressing Goal: Home care management will improve 05/14/2024 0443 by Vinie Ginny PARAS, RN Outcome: Progressing 05/14/2024 0443 by Vinie Ginny PARAS, RN Outcome: Progressing   Problem: Education: Goal: Ability to describe self-care measures that may prevent or decrease complications (Diabetes Survival Skills Education) will improve 05/14/2024 0443 by Vinie Ginny PARAS, RN Outcome: Progressing 05/14/2024 0443 by Vinie Ginny PARAS, RN Outcome: Progressing Goal: Individualized Educational Video(s) 05/14/2024 0443 by Vinie Ginny PARAS, RN Outcome: Progressing 05/14/2024 0443 by Vinie Ginny PARAS, RN Outcome: Progressing   Problem: Coping: Goal: Ability to adjust  to condition or change in health will improve 05/14/2024 0443 by Vinie Ginny PARAS, RN Outcome: Progressing 05/14/2024 0443 by Vinie Ginny PARAS, RN Outcome: Progressing   Problem: Fluid Volume: Goal: Ability to maintain a balanced intake and output will improve 05/14/2024 0443 by Vinie Ginny PARAS, RN Outcome: Progressing 05/14/2024 0443 by Vinie Ginny PARAS, RN Outcome: Progressing   Problem: Health Behavior/Discharge Planning: Goal: Ability to identify and utilize available resources and services will improve 05/14/2024 0443 by Vinie Ginny PARAS, RN Outcome: Progressing 05/14/2024 0443 by Vinie Ginny PARAS, RN Outcome: Progressing Goal: Ability to manage health-related needs will improve 05/14/2024 0443 by Vinie Ginny PARAS, RN Outcome: Progressing 05/14/2024 0443 by Vinie Ginny PARAS, RN Outcome: Progressing   Problem: Metabolic: Goal: Ability to maintain appropriate glucose levels will improve 05/14/2024 0443 by Vinie Ginny PARAS, RN Outcome: Progressing 05/14/2024 0443 by Vinie Ginny PARAS, RN Outcome: Progressing   Problem: Nutritional: Goal: Maintenance of adequate nutrition will improve 05/14/2024 0443 by Vinie Ginny PARAS, RN Outcome: Progressing 05/14/2024 0443 by Vinie Ginny PARAS, RN Outcome: Progressing Goal: Progress toward achieving an optimal weight will improve 05/14/2024 0443 by Vinie Ginny PARAS, RN Outcome: Progressing 05/14/2024 0443 by Vinie Ginny PARAS, RN Outcome: Progressing   Problem: Skin Integrity: Goal: Risk for impaired skin integrity will decrease 05/14/2024 0443 by Vinie Ginny PARAS, RN Outcome: Progressing 05/14/2024 0443 by Vinie Ginny PARAS, RN Outcome: Progressing   Problem: Tissue Perfusion: Goal: Adequacy of tissue perfusion will improve 05/14/2024 0443 by Vinie Ginny PARAS, RN Outcome: Progressing 05/14/2024 0443 by Vinie Ginny PARAS, RN Outcome: Progressing   Problem: Education: Goal: Knowledge of General Education information will improve Description:  Including pain rating scale, medication(s)/side effects and non-pharmacologic comfort measures 05/14/2024 0443 by Vinie Ginny PARAS, RN Outcome: Progressing 05/14/2024 0443 by Vinie Ginny PARAS, RN Outcome: Progressing   Problem: Health Behavior/Discharge Planning: Goal: Ability to  manage health-related needs will improve 05/14/2024 0443 by Vinie Ginny PARAS, RN Outcome: Progressing 05/14/2024 0443 by Vinie Ginny PARAS, RN Outcome: Progressing   Problem: Clinical Measurements: Goal: Ability to maintain clinical measurements within normal limits will improve 05/14/2024 0443 by Vinie Ginny PARAS, RN Outcome: Progressing 05/14/2024 0443 by Vinie Ginny PARAS, RN Outcome: Progressing Goal: Will remain free from infection 05/14/2024 0443 by Vinie Ginny PARAS, RN Outcome: Progressing 05/14/2024 0443 by Vinie Ginny PARAS, RN Outcome: Progressing Goal: Diagnostic test results will improve 05/14/2024 0443 by Vinie Ginny PARAS, RN Outcome: Progressing 05/14/2024 0443 by Vinie Ginny PARAS, RN Outcome: Progressing Goal: Respiratory complications will improve 05/14/2024 0443 by Vinie Ginny PARAS, RN Outcome: Progressing 05/14/2024 0443 by Vinie Ginny PARAS, RN Outcome: Progressing Goal: Cardiovascular complication will be avoided 05/14/2024 0443 by Vinie Ginny PARAS, RN Outcome: Progressing 05/14/2024 0443 by Vinie Ginny PARAS, RN Outcome: Progressing   Problem: Activity: Goal: Risk for activity intolerance will decrease 05/14/2024 0443 by Vinie Ginny PARAS, RN Outcome: Progressing 05/14/2024 0443 by Vinie Ginny PARAS, RN Outcome: Progressing   Problem: Nutrition: Goal: Adequate nutrition will be maintained 05/14/2024 0443 by Vinie Ginny PARAS, RN Outcome: Progressing 05/14/2024 0443 by Vinie Ginny PARAS, RN Outcome: Progressing   Problem: Coping: Goal: Level of anxiety will decrease 05/14/2024 0443 by Vinie Ginny PARAS, RN Outcome: Progressing 05/14/2024 0443 by Vinie Ginny PARAS, RN Outcome: Progressing    Problem: Elimination: Goal: Will not experience complications related to bowel motility 05/14/2024 0443 by Vinie Ginny PARAS, RN Outcome: Progressing 05/14/2024 0443 by Vinie Ginny PARAS, RN Outcome: Progressing Goal: Will not experience complications related to urinary retention 05/14/2024 0443 by Vinie Ginny PARAS, RN Outcome: Progressing 05/14/2024 0443 by Vinie Ginny PARAS, RN Outcome: Progressing   Problem: Pain Managment: Goal: General experience of comfort will improve and/or be controlled 05/14/2024 0443 by Vinie Ginny PARAS, RN Outcome: Progressing 05/14/2024 0443 by Vinie Ginny PARAS, RN Outcome: Progressing   Problem: Safety: Goal: Ability to remain free from injury will improve 05/14/2024 0443 by Vinie Ginny PARAS, RN Outcome: Progressing 05/14/2024 0443 by Vinie Ginny PARAS, RN Outcome: Progressing   Problem: Skin Integrity: Goal: Risk for impaired skin integrity will decrease 05/14/2024 0443 by Vinie Ginny PARAS, RN Outcome: Progressing 05/14/2024 0443 by Vinie Ginny PARAS, RN Outcome: Progressing

## 2024-05-14 NOTE — Progress Notes (Signed)
 [facility]  Date of Admission:  05/10/2024     Reason for Follow Up: Diskitis  Total days of antibiotics 1         ASSESSMENT:   Robert [name] is a 73 year old Caucasian male with recent admission for Enterococcus faecalis UTI/bacteremia in June 2025 and Serratia marcescens UTI/bacteremia April 2025 presenting with worsening back pain and found to have L4-S1 osteomyelitis/discitis and epidural phlegmon.  Status post IR aspiration with Gram stain showing gram-positive cocci and cultures pending.   Robert [name] blood cultures remain without growth to date an aspiration cultures are pending. Discussed plan of care for prolonged course of antibiotic therapy with antibiotics to be determined by cultures. Will continue to monitor for culture growth and narrow antibiotics as appropriate and for now continue with broad spectrum coverage with daptomycin  and cefepime . CK level today 18 and will continue to monitor for therapeutic drug monitoring while on daptomycin . Hopeful to finalize antibiotic plan within the next 48 hours pending culture results. Remaining medical and supportive care per Internal Medicine.   PLAN:  Continue current dose of daptomycin  and cefepime . Monitor cultures and narrow antibiotics as able.  Continue therapeutic drug monitoring of CK levels Remaining medical and supportive care per Internal Medicine.   Principal Problem:   Diskitis Active Problems:   Hyperlipidemia   Essential hypertension   Type 2 diabetes mellitus without complication, without long-term current use of insulin  (HCC)   Paroxysmal A-fib (HCC)   Bladder cancer (HCC)   Bacteremia   CKD (chronic kidney disease), stage III (HCC)    apixaban   5 mg Oral BID   atorvastatin   40 mg Oral QHS   B-complex with vitamin C  1 tablet Oral Once per day on Monday Wednesday Friday   cholecalciferol   5,000 Units Oral Once per day on Monday Wednesday Friday   insulin  aspart  0-15 Units  Subcutaneous TID WC   insulin  aspart  0-5 Units Subcutaneous QHS   metoprolol  succinate  25 mg Oral QHS    SUBJECTIVE:  Afebrile overnight with no acute events. Tolerating antibiotics with no adverse side effects. Has questions about how he got this infection.  Allergies  Allergen Reactions   Lisinopril  Swelling and Other (See Comments)    Tongue and throat became swollen   Lyrica  [Pregabalin ] Swelling and Other (See Comments)    Patient noticed swelling of the ankles and feet when taking this, so he stopped using it.     Review of Systems: Review of Systems  Constitutional:  Negative for chills, fever and weight loss.  Respiratory:  Negative for cough, shortness of breath and wheezing.   Cardiovascular:  Negative for chest pain and leg swelling.  Gastrointestinal:  Negative for abdominal pain, constipation, diarrhea, nausea and vomiting.  Musculoskeletal:  Positive for back pain.  Skin:  Negative for rash.      OBJECTIVE: Vitals:   05/13/24 2040 05/13/24 2208 05/14/24 0422 05/14/24 0742  BP: 131/65 131/65 106/73 115/65  Pulse: 77 77 63 60  Resp: 16  16   Temp: 98.5 F (36.9 C)  98.1 F (36.7 C) 97.7 F (36.5 C)  TempSrc: Oral  Oral Oral  SpO2: 100%  100% 100%  Weight:      Height:       Body mass index is 23.75 kg/m.  Physical Exam Constitutional:      General: He is not in acute distress.    Appearance: He is well-developed.     Comments:  Lying in bed with head of bed elevated; pleasant.   Cardiovascular:     Rate and Rhythm: Normal rate and regular rhythm.     Heart sounds: Normal heart sounds.  Pulmonary:     Effort: Pulmonary effort is normal.     Breath sounds: Normal breath sounds.  Skin:    General: Skin is warm and dry.  Neurological:     Mental Status: He is alert and oriented to person, place, and time.  Psychiatric:        Mood and Affect: Mood normal.     Lab Results Lab Results  Component Value Date   WBC 7.4 05/13/2024   HGB 11.2  (L) 05/13/2024   HCT 33.6 (L) 05/13/2024   MCV 82.4 05/13/2024   PLT 180 05/13/2024    Lab Results  Component Value Date   CREATININE 1.08 05/13/2024   BUN 15 05/13/2024   NA 132 (L) 05/13/2024   K 3.9 05/13/2024   CL 99 05/13/2024   CO2 25 05/13/2024    Lab Results  Component Value Date   ALT 12 05/11/2024   AST 14 (L) 05/11/2024   ALKPHOS 75 05/11/2024   BILITOT 0.5 05/11/2024     Microbiology: Recent Results (from the past 240 hours)  Blood culture (routine x 2)     Status: None (Preliminary result)   Collection Time: 05/10/24  6:50 PM   Specimen: BLOOD  Result Value Ref Range Status   Specimen Description BLOOD LEFT ANTECUBITAL  Final   Special Requests   Final    BOTTLES DRAWN AEROBIC AND ANAEROBIC Blood Culture adequate volume   Culture   Final    NO GROWTH 4 DAYS Performed at [facility], [address], [name], [place] [address]    Report Status PENDING  Incomplete  Blood culture (routine x 2)     Status: None (Preliminary result)   Collection Time: 05/10/24  7:05 PM   Specimen: BLOOD LEFT FOREARM  Result Value Ref Range Status   Specimen Description BLOOD LEFT FOREARM  Final   Special Requests   Final    BOTTLES DRAWN AEROBIC AND ANAEROBIC Blood Culture adequate volume   Culture   Final    NO GROWTH 4 DAYS Performed at [facility], [address] [place], [place] [address]    Report Status PENDING  Incomplete  Body fluid culture w Gram Stain     Status: None (Preliminary result)   Collection Time: 05/12/24 12:20 PM   Specimen: Other Source; Body Fluid  Result Value Ref Range Status   Specimen Description OTHER  Final   Special Requests IDISC  Final   Gram Stain   Final    RARE WBC PRESENT, PREDOMINANTLY PMN NO ORGANISMS SEEN    Culture   Final    CULTURE REINCUBATED FOR BETTER GROWTH Performed at [facility], [address], [place], [place] [address]    Report Status PENDING  Incomplete  Aerobic/Anaerobic Culture w Gram  Stain (surgical/deep wound)     Status: None (Preliminary result)   Collection Time: 05/12/24 12:21 PM   Specimen: Other Source; Tissue  Result Value Ref Range Status   Specimen Description OTHER  Final   Special Requests IDISC  Final   Gram Stain   Final    FEW WBC PRESENT, PREDOMINANTLY PMN RARE GRAM POSITIVE COCCI Performed at [facility], [address], [place], [place] [address]    Culture PENDING  Incomplete  Report Status PENDING  Incomplete    I have personally spent 26 minutes involved in face-to-face and non-face-to-face activities for this patient on the day of the visit. Professional time spent includes the following activities: preparing to see the patient (review of tests), performing a medically appropriate examination, ordering medications, communicating with other health care professionals, documenting clinical information in the EMR, communicating results and counseling patient regarding medication and plan of care, and care coordination.   Greg Colson Barco, NP [facility]  05/14/2024  10:43 AM

## 2024-05-15 ENCOUNTER — Other Ambulatory Visit: Payer: Self-pay

## 2024-05-15 ENCOUNTER — Ambulatory Visit: Admitting: Internal Medicine

## 2024-05-15 DIAGNOSIS — N1831 Chronic kidney disease, stage 3a: Secondary | ICD-10-CM | POA: Diagnosis not present

## 2024-05-15 DIAGNOSIS — C675 Malignant neoplasm of bladder neck: Secondary | ICD-10-CM | POA: Diagnosis not present

## 2024-05-15 DIAGNOSIS — M4647 Discitis, unspecified, lumbosacral region: Secondary | ICD-10-CM | POA: Diagnosis not present

## 2024-05-15 DIAGNOSIS — E785 Hyperlipidemia, unspecified: Secondary | ICD-10-CM

## 2024-05-15 DIAGNOSIS — B952 Enterococcus as the cause of diseases classified elsewhere: Secondary | ICD-10-CM

## 2024-05-15 DIAGNOSIS — B9689 Other specified bacterial agents as the cause of diseases classified elsewhere: Secondary | ICD-10-CM | POA: Diagnosis not present

## 2024-05-15 DIAGNOSIS — M4627 Osteomyelitis of vertebra, lumbosacral region: Secondary | ICD-10-CM | POA: Diagnosis not present

## 2024-05-15 DIAGNOSIS — G061 Intraspinal abscess and granuloma: Secondary | ICD-10-CM | POA: Diagnosis not present

## 2024-05-15 DIAGNOSIS — I480 Paroxysmal atrial fibrillation: Secondary | ICD-10-CM | POA: Diagnosis not present

## 2024-05-15 DIAGNOSIS — M4640 Discitis, unspecified, site unspecified: Secondary | ICD-10-CM | POA: Diagnosis not present

## 2024-05-15 LAB — CBC WITH DIFFERENTIAL/PLATELET
Abs Immature Granulocytes: 0.11 K/uL — ABNORMAL HIGH (ref 0.00–0.07)
Basophils Absolute: 0.1 K/uL (ref 0.0–0.1)
Basophils Relative: 1 %
Eosinophils Absolute: 0.1 K/uL (ref 0.0–0.5)
Eosinophils Relative: 1 %
HCT: 33.1 % — ABNORMAL LOW (ref 39.0–52.0)
Hemoglobin: 11.1 g/dL — ABNORMAL LOW (ref 13.0–17.0)
Immature Granulocytes: 2 %
Lymphocytes Relative: 25 %
Lymphs Abs: 1.8 K/uL (ref 0.7–4.0)
MCH: 27.3 pg (ref 26.0–34.0)
MCHC: 33.5 g/dL (ref 30.0–36.0)
MCV: 81.5 fL (ref 80.0–100.0)
Monocytes Absolute: 0.7 K/uL (ref 0.1–1.0)
Monocytes Relative: 9 %
Neutro Abs: 4.3 K/uL (ref 1.7–7.7)
Neutrophils Relative %: 62 %
Platelets: 182 K/uL (ref 150–400)
RBC: 4.06 MIL/uL — ABNORMAL LOW (ref 4.22–5.81)
RDW: 14.6 % (ref 11.5–15.5)
WBC: 6.9 K/uL (ref 4.0–10.5)
nRBC: 0 % (ref 0.0–0.2)

## 2024-05-15 LAB — CULTURE, BLOOD (ROUTINE X 2)
Culture: NO GROWTH
Culture: NO GROWTH
Special Requests: ADEQUATE
Special Requests: ADEQUATE

## 2024-05-15 LAB — BASIC METABOLIC PANEL WITH GFR
Anion gap: 9 (ref 5–15)
BUN: 17 mg/dL (ref 8–23)
CO2: 21 mmol/L — ABNORMAL LOW (ref 22–32)
Calcium: 8.9 mg/dL (ref 8.9–10.3)
Chloride: 103 mmol/L (ref 98–111)
Creatinine, Ser: 1.09 mg/dL (ref 0.61–1.24)
GFR, Estimated: >60 mL/min (ref >60)
Glucose, Bld: 135 mg/dL — ABNORMAL HIGH (ref 70–99)
Potassium: 4 mmol/L (ref 3.5–5.1)
Sodium: 133 mmol/L — ABNORMAL LOW (ref 135–145)

## 2024-05-15 LAB — GLUCOSE, CAPILLARY
Glucose-Capillary: 147 mg/dL — ABNORMAL HIGH (ref 70–99)
Glucose-Capillary: 193 mg/dL — ABNORMAL HIGH (ref 70–99)
Glucose-Capillary: 145 mg/dL — ABNORMAL HIGH (ref 70–99)

## 2024-05-15 MED ORDER — CHLORHEXIDINE GLUCONATE CLOTH 2 % EX PADS: 6.0000 | MEDICATED_PAD | Freq: Every day | CUTANEOUS | Status: DC

## 2024-05-15 MED ORDER — AMPICILLIN IV (FOR PTA / DISCHARGE USE ONLY): 12.0000 g | INTRAVENOUS | 0 refills | Status: DC

## 2024-05-15 MED ORDER — SENNOSIDES-DOCUSATE SODIUM 8.6-50 MG PO TABS: 1.0000 | ORAL_TABLET | Freq: Two times a day (BID) | ORAL | Status: AC | PRN

## 2024-05-15 MED ORDER — SODIUM CHLORIDE 0.9% FLUSH: 10.0000 mL | INTRAVENOUS | Status: DC | PRN

## 2024-05-15 MED ORDER — SODIUM CHLORIDE 0.9 % IV SOLN
2.0000 g | INTRAVENOUS | Status: DC
  Administered 2024-05-15 (×2): 2 g via INTRAVENOUS
  Filled 2024-05-15 (×6): qty 2000

## 2024-05-15 NOTE — Progress Notes (Signed)
 PHARMACY CONSULT NOTE FOR:  OUTPATIENT  PARENTERAL ANTIBIOTIC THERAPY (OPAT)  Indication: Discitis   Regimen: Ampicillin  12 gm every 24 hours as a continuous infusion End date: 06/10/24  IV antibiotic discharge orders are pended. To discharging provider:  please sign these orders via discharge navigator,  Select New Orders & click on the button choice - Manage This Unsigned Work.     Thank you for allowing pharmacy to be a part of this patient's care.  Damien Quiet, PharmD, BCPS, BCIDP Infectious Diseases Clinical Pharmacist Phone: [phone] 05/15/2024, 10:26 AM

## 2024-05-15 NOTE — Progress Notes (Signed)
 Peripherally Inserted Central Catheter Placement  The IV Nurse has discussed with the patient and/or persons authorized to consent for the patient, the purpose of this procedure and the potential benefits and risks involved with this procedure.  The benefits include less needle sticks, lab draws from the catheter, and the patient may be discharged home with the catheter. Risks include, but not limited to, infection, bleeding, blood clot (thrombus formation), and puncture of an artery; nerve damage and irregular heartbeat and possibility to perform a PICC exchange if needed/ordered by physician.  Alternatives to this procedure were also discussed.  Bard Power PICC patient education guide, fact sheet on infection prevention and patient information card has been provided to patient /or left at bedside.    PICC Placement Documentation  PICC Single Lumen 05/15/24 Right Brachial 41 cm 0 cm (Active)  Indication for Insertion or Continuance of Line Home intravenous therapies (PICC only) 05/15/24 1233  Exposed Catheter (cm) 0 cm 05/15/24 1233  Site Assessment Clean, Dry, Intact 05/15/24 1233  Line Status Flushed;Saline locked;Blood return noted 05/15/24 1233  Dressing Type Transparent;Securing device 05/15/24 1233  Dressing Status Antimicrobial disc/dressing in place;Clean, Dry, Intact 05/15/24 1233  Line Care Connections checked and tightened 05/15/24 1233  Line Adjustment (NICU/IV Team Only) No 05/15/24 1233  Dressing Intervention New dressing;Adhesive placed at insertion site (IV team only) 05/15/24 1233  Dressing Change Due 05/22/24 05/15/24 1233       Renaee Neville Skillern 05/15/2024, 12:33 PM

## 2024-05-15 NOTE — TOC Transition Note (Signed)
 Transition of Care [facility]) - Discharge Note   Patient Details  Name: Robert [name] MRN: [id number] Date of Birth: 1951-09-24  [facility] CM/SW Contact:  Roxie KANDICE Stain, RN Phone Number: 05/15/2024, 3:47 PM   Clinical Narrative:    Patient stable for discharge. Pam with [facility] will do the teaching. This RNCM offered choice for Home Health, Robert [name] states he has no preference, RNCM made referral to Amy with [facility], She is able to take referral. Address, Phone number and PCP verified.  Final next level of care: Home w Home Health Services Barriers to Discharge: Barriers Resolved   Patient Goals and CMS Choice Patient states their goals for this hospitalization and ongoing recovery are:: get better and not to have to come back to hospital regarding the same issue CMS Medicare.gov Compare Post Acute Care list provided to:: Patient Choice offered to / list presented to : Patient      Discharge Placement                 home      Discharge Plan and Services Additional resources added to the After Visit Summary for     Discharge Planning Services: CM Consult                      HH Arranged: RN [facility] Agency: [facility] Date [facility] Agency Contacted: 05/15/24 Time HH Agency Contacted: 1546 Representative spoke with at [facility] Agency: Amy  Social Drivers of Health (SDOH) Interventions SDOH Screenings   Food Insecurity: No Food Insecurity (05/11/2024)  Housing: Low Risk  (05/11/2024)  Transportation Needs: No Transportation Needs (05/11/2024)  Utilities: Not At Risk (05/11/2024)  Alcohol  Screen: Low Risk  (04/08/2024)  Depression (PHQ2-9): Low Risk  (05/09/2024)  Financial Resource Strain: Low Risk  (04/08/2024)  Physical Activity: Inactive (04/08/2024)  Social Connections: Moderately Integrated (05/11/2024)  Recent Concern: Social Connections - Moderately Isolated (03/28/2024)  Stress: Stress Concern Present (04/08/2024)  Tobacco Use:  High Risk (05/10/2024)  Health Literacy: Adequate Health Literacy (04/08/2024)     Readmission Risk Interventions    04/01/2024    2:58 PM 02/07/2024    2:07 PM 12/10/2023    4:50 PM  Readmission Risk Prevention Plan  Transportation Screening Complete Complete Complete  PCP or Specialist Appt within 5-7 Days   Complete  PCP or Specialist Appt within 3-5 Days Complete Complete   Home Care Screening   Complete  Medication Review (RN CM)   Complete  HRI or Home Care Consult Complete Complete   Social Work Consult for Recovery Care Planning/Counseling Complete Complete   Palliative Care Screening Not Applicable Not Applicable   Medication Review Oceanographer) Complete Referral to Pharmacy

## 2024-05-15 NOTE — Plan of Care (Signed)
  Problem: Education: Goal: Knowledge of the procedure and recovery process will improve Outcome: Progressing   Problem: Bowel/Gastric: Goal: Gastrointestinal status for postoperative course will improve Outcome: Progressing   Problem: Pain Management: Goal: General experience of comfort will improve Outcome: Progressing   Problem: Skin Integrity: Goal: Demonstration of wound healing without infection will improve Outcome: Progressing   Problem: Urinary Elimination: Goal: Ability to avoid or minimize complications of infection will improve Outcome: Progressing Goal: Ability to achieve and maintain urine output will improve Outcome: Progressing Goal: Home care management will improve Outcome: Progressing   Problem: Education: Goal: Ability to describe self-care measures that may prevent or decrease complications (Diabetes Survival Skills Education) will improve Outcome: Progressing Goal: Individualized Educational Video(s) Outcome: Progressing   Problem: Coping: Goal: Ability to adjust to condition or change in health will improve Outcome: Progressing   Problem: Fluid Volume: Goal: Ability to maintain a balanced intake and output will improve Outcome: Progressing   Problem: Health Behavior/Discharge Planning: Goal: Ability to identify and utilize available resources and services will improve Outcome: Progressing Goal: Ability to manage health-related needs will improve Outcome: Progressing   Problem: Metabolic: Goal: Ability to maintain appropriate glucose levels will improve Outcome: Progressing   Problem: Nutritional: Goal: Maintenance of adequate nutrition will improve Outcome: Progressing Goal: Progress toward achieving an optimal weight will improve Outcome: Progressing   Problem: Skin Integrity: Goal: Risk for impaired skin integrity will decrease Outcome: Progressing   Problem: Tissue Perfusion: Goal: Adequacy of tissue perfusion will improve Outcome:  Progressing   Problem: Education: Goal: Knowledge of General Education information will improve Description: Including pain rating scale, medication(s)/side effects and non-pharmacologic comfort measures Outcome: Progressing   Problem: Health Behavior/Discharge Planning: Goal: Ability to manage health-related needs will improve Outcome: Progressing   Problem: Clinical Measurements: Goal: Ability to maintain clinical measurements within normal limits will improve Outcome: Progressing Goal: Will remain free from infection Outcome: Progressing Goal: Diagnostic test results will improve Outcome: Progressing Goal: Respiratory complications will improve Outcome: Progressing Goal: Cardiovascular complication will be avoided Outcome: Progressing   Problem: Activity: Goal: Risk for activity intolerance will decrease Outcome: Progressing   Problem: Nutrition: Goal: Adequate nutrition will be maintained Outcome: Progressing   Problem: Coping: Goal: Level of anxiety will decrease Outcome: Progressing   Problem: Elimination: Goal: Will not experience complications related to bowel motility Outcome: Progressing Goal: Will not experience complications related to urinary retention Outcome: Progressing   Problem: Pain Managment: Goal: General experience of comfort will improve and/or be controlled Outcome: Progressing   Problem: Safety: Goal: Ability to remain free from injury will improve Outcome: Progressing   Problem: Skin Integrity: Goal: Risk for impaired skin integrity will decrease Outcome: Progressing

## 2024-05-15 NOTE — Progress Notes (Signed)
 [facility]  Date of Admission:  05/10/2024     Reason for Follow Up: Diskitis  Total days of antibiotics 3         ASSESSMENT:  Robert [name] is a 73 year old Caucasian male with recent admission for Enterococcus faecalis UTI/bacteremia in June 2025 and Serratia marcescens UTI/bacteremia April 2025 presenting with worsening back pain and found to have L4-S1 osteomyelitis/discitis and epidural phlegmon. Status post IR aspiration with Gram stain showing gram-positive cocci and cultures showing Enterococcus faecalis.   Robert [name] cultures are showing pan-sensitive Enterococcus faecalis. Discussed plan of care to narrow antibiotics to ampicillin  via PICC line for 4 weeks. PICC line order placed and OPAT below. Will arrange follow up in ID clinic and okay for discharge from ID standpoint once PICC placed and Home Health arranged. Remaining medical and supportive care per Internal Medicine.    PLAN:  Change antibiotics to ampicillin . Place PICC Home Health/OPAT Follow up in ID clinic and okay for discharge from ID standpoint. Remaining medical and supportive care per Internal Medicine.  Diagnosis:  E. Faecalis discitis   Allergies  Allergen Reactions   Lisinopril  Swelling and Other (See Comments)    Tongue and throat became swollen   Lyrica  [Pregabalin ] Swelling and Other (See Comments)    Patient noticed swelling of the ankles and feet when taking this, so he stopped using it.    OPAT Orders Discharge antibiotics to be given via PICC line Discharge antibiotics: Ampicillin  12 g IV continuous q 24 Per pharmacy protocol  Duration: 4 weeks   End Date: 06/10/24   [facility] Care Per Protocol:  Home health RN for IV administration and teaching; PICC line care and labs.    Labs weekly while on IV antibiotics: _X_ CBC with differential _X_ BMP __ CMP _X_ CRP _X_ ESR __ Vancomycin trough __ CK  _X_ Please pull PIC at completion of IV  antibiotics __ Please leave PIC in place until doctor has seen patient or been notified  Fax weekly labs to [phone]  Clinic Follow Up Appt:  06/10/24 at 2pm with Dr. Dennise    Principal Problem:   Diskitis Active Problems:   Hyperlipidemia   Essential hypertension   Type 2 diabetes mellitus without complication, without long-term current use of insulin  (HCC)   Paroxysmal A-fib (HCC)   Bladder cancer (HCC)   Bacteremia   CKD (chronic kidney disease), stage III (HCC)    apixaban   5 mg Oral BID   atorvastatin   40 mg Oral QHS   B-complex with vitamin C  1 tablet Oral Once per day on Monday Wednesday Friday   cholecalciferol   5,000 Units Oral Once per day on Monday Wednesday Friday   insulin  aspart  0-15 Units Subcutaneous TID WC   insulin  aspart  0-5 Units Subcutaneous QHS   metoprolol  succinate  25 mg Oral QHS    SUBJECTIVE:  Afebrile overnight with no acute events. Tolerating antibiotics with no adverse side effects. Continues to have back pain. Asking how he could have acquired this infection.   Allergies  Allergen Reactions   Lisinopril  Swelling and Other (See Comments)    Tongue and throat became swollen   Lyrica  [Pregabalin ] Swelling and Other (See Comments)    Patient noticed swelling of the ankles and feet when taking this, so he stopped using it.     Review of Systems: Review of Systems  Constitutional:  Negative for chills, fever and weight loss.  Respiratory:  Negative for  cough, shortness of breath and wheezing.   Cardiovascular:  Negative for chest pain and leg swelling.  Gastrointestinal:  Negative for abdominal pain, constipation, diarrhea, nausea and vomiting.  Musculoskeletal:  Positive for back pain.  Skin:  Negative for rash.      OBJECTIVE: Vitals:   05/14/24 1652 05/14/24 2112 05/15/24 0418 05/15/24 0743  BP: 119/65 119/63 108/66 116/69  Pulse: 71 66 60 60  Resp: 18   18  Temp: 98 F (36.7 C) 98.2 F (36.8 C) 98.1 F (36.7 C) 98.1  F (36.7 C)  TempSrc: Oral Oral Oral Oral  SpO2: 99% 96% 99% 100%  Weight:      Height:       Body mass index is 23.75 kg/m.  Physical Exam Constitutional:      General: He is not in acute distress.    Appearance: He is well-developed.  Cardiovascular:     Rate and Rhythm: Normal rate and regular rhythm.     Heart sounds: Normal heart sounds.  Pulmonary:     Effort: Pulmonary effort is normal.     Breath sounds: Normal breath sounds.  Skin:    General: Skin is warm and dry.  Neurological:     Mental Status: He is alert.  Psychiatric:        Mood and Affect: Mood normal.     Lab Results Lab Results  Component Value Date   WBC 6.9 05/15/2024   HGB 11.1 (L) 05/15/2024   HCT 33.1 (L) 05/15/2024   MCV 81.5 05/15/2024   PLT 182 05/15/2024    Lab Results  Component Value Date   CREATININE 1.09 05/15/2024   BUN 17 05/15/2024   NA 133 (L) 05/15/2024   K 4.0 05/15/2024   CL 103 05/15/2024   CO2 21 (L) 05/15/2024    Lab Results  Component Value Date   ALT 12 05/11/2024   AST 14 (L) 05/11/2024   ALKPHOS 75 05/11/2024   BILITOT 0.5 05/11/2024     Microbiology: Recent Results (from the past 240 hours)  Blood culture (routine x 2)     Status: None   Collection Time: 05/10/24  6:50 PM   Specimen: BLOOD  Result Value Ref Range Status   Specimen Description BLOOD LEFT ANTECUBITAL  Final   Special Requests   Final    BOTTLES DRAWN AEROBIC AND ANAEROBIC Blood Culture adequate volume   Culture   Final    NO GROWTH 5 DAYS Performed at [facility], [address] [place], [place] [address]    Report Status 05/15/2024 FINAL  Final  Blood culture (routine x 2)     Status: None   Collection Time: 05/10/24  7:05 PM   Specimen: BLOOD LEFT FOREARM  Result Value Ref Range Status   Specimen Description BLOOD LEFT FOREARM  Final   Special Requests   Final    BOTTLES DRAWN AEROBIC AND ANAEROBIC Blood Culture adequate volume   Culture   Final    NO GROWTH 5  DAYS Performed at [facility], [address] [place], [place] [address]    Report Status 05/15/2024 FINAL  Final  Body fluid culture w Gram Stain     Status: None (Preliminary result)   Collection Time: 05/12/24 12:20 PM   Specimen: Other Source; Body Fluid  Result Value Ref Range Status   Specimen Description OTHER  Final   Special Requests IDISC  Final   Gram Stain   Final  RARE WBC PRESENT, PREDOMINANTLY PMN NO ORGANISMS SEEN Performed at [facility], [address], [place], [place] [address]    Culture FEW ENTEROCOCCUS FAECALIS  Final   Report Status PENDING  Incomplete   Organism ID, Bacteria ENTEROCOCCUS FAECALIS  Final      Susceptibility   Enterococcus faecalis - MIC*    AMPICILLIN  <=2 SENSITIVE Sensitive     VANCOMYCIN 2 SENSITIVE Sensitive     GENTAMICIN  SYNERGY SENSITIVE Sensitive     * FEW ENTEROCOCCUS FAECALIS  Aerobic/Anaerobic Culture w Gram Stain (surgical/deep wound)     Status: None (Preliminary result)   Collection Time: 05/12/24 12:21 PM   Specimen: Other Source; Tissue  Result Value Ref Range Status   Specimen Description OTHER  Final   Special Requests IDISC  Final   Gram Stain   Final    FEW WBC PRESENT, PREDOMINANTLY PMN RARE GRAM POSITIVE COCCI Performed at [facility], [address], [place], [place] [address]    Culture   Final    ABUNDANT ENTEROCOCCUS FAECALIS NO ANAEROBES ISOLATED; CULTURE IN PROGRESS FOR 5 DAYS    Report Status PENDING  Incomplete   Organism ID, Bacteria ENTEROCOCCUS FAECALIS  Final      Susceptibility   Enterococcus faecalis - MIC*    AMPICILLIN  <=2 SENSITIVE Sensitive     VANCOMYCIN 2 SENSITIVE Sensitive     GENTAMICIN  SYNERGY SENSITIVE Sensitive     * ABUNDANT ENTEROCOCCUS FAECALIS    I have personally spent 32 minutes involved in face-to-face and non-face-to-face activities for this patient on the day of the visit. Professional time spent includes the following activities: preparing to see  the patient (review of tests), performing a medically appropriate examination, ordering medications, communicating with other health care professionals, documenting clinical information in the EMR, communicating results and counseling patient and family regarding medication and plan of care, and care coordination.    Cathlyn July, NP [facility]  05/15/2024  10:25 AM

## 2024-05-15 NOTE — Discharge Summary (Signed)
 Physician Discharge Summary  Robert [name] FMW:[id number] DOB: [birth year] DOA: 05/10/2024  PCP: Garald Karlynn GAILS, MD  Admit date: 05/10/2024 Discharge date: 05/15/24  Admitted From: Home Disposition: Home Recommendations for Outpatient Follow-up:  Outpatient follow-up with PCP and infectious disease as below Check CBC with differential, BMP, CRP and ESR weekly and fax results to ID at [phone] Please follow up on the following pending results: None  Home Health: [facility] RN for IV antibiotics Equipment/Devices: None  Discharge Condition: Stable CODE STATUS: Full code  Follow-up Information     Dennise Kingsley, MD Follow up.   Specialty: Infectious Diseases Why: 06/10/24 at 2 pm. Please call to reschedule if you are not able to make this appointment. Contact information: [address], [address] [place] [address] [phone]         Plotnikov, Karlynn GAILS, MD. Schedule an appointment as soon as possible for a visit in 1 week(s).   Specialty: Internal Medicine Contact information: [address] [place] [address] [phone]                 Hospital course 73 y.o. male with MH of PAF on Eliquis , CKD-3, DM-2, HTN, HLD, bladder cancer s/p cystoscopy and prostatectomy with right nephroureterectomy and total urethrectomy, and chemotherapy, recent bacteremia with  beta-lactamase positive Bactroides and Enterococcus faecalis on June 7 with similar bacteria in the urine who was on IV antibiotics and transition to oral antibiotics.    Patient presented in early July with back pain at which point MRI of the lumbar spine showed no concern for infection.  Again had the Enterococcus faecalis in urine as well as blood.  Patient was evaluated with TTE and TEE but no vegetation.  Was discharged on oral amoxicillin  for 2 weeks after port was removed.  He has surveillance blood cultures with infectious disease, Dr. Dennise with no apparent infection. On the  day of presentation, patient's MRI of the lumbar spine showed evidence of discitis and osteomyelitis at L4-L5 and L5-S1 levels.  There is also soft tissue phlegmon at the level with involvement of the right psoas muscle. He underwent disc aspiration on 05/12/2024.  Aspirate culture grew Enterococcus faecalis.  Antibiotics de-escalated to IV ampicillin .  PICC line placed on 7/23.  Patient is discharged on IV ampicillin  12 g daily until 06/10/2024 per recommendation by ID.  Weekly labs and ID follow-up as above.  See individual problem list below for more.   Problems addressed during this hospitalization Acute L4-L5 discitis and Osteomyelitis with epidural phlegmon,POA: -S/p IR disc aspiration 7/20. This was done off of antibiotics. -Blood cultures 7/18 showed NGTD -Aspirate culture with pansensitive Enterococcus faecalis. -Cleared for discharge by ID on IV ampicillin  until 06/10/2024. -Weekly labs as above. - Outpatient follow-up with PCP and ID as above   Type 2 diabetes mellitus, non insulin  dependent,POA: - Continue home Ozempic .   Long standing persistent atrial fibrillation,POA: - Eliquis  5 mg PO bid - Toprol  XL 25 mg PO daily     Hypertension: - On Toprol  XL 25 mg PO daily    Hyperlipidemia  - On Lipitor 40 mg PO at bedtime    Hx of bladder CA  - Outpt follow up with urology and oncology    Body mass index is 23.75 kg/m.           Consultations: Infectious disease Interventional radiology  Time spent 35  minutes  Vital signs Vitals:   05/14/24 1652 05/14/24 2112 05/15/24  0418 05/15/24 0743  BP: 119/65 119/63 108/66 116/69  Pulse: 71 66 60 60  Temp: 98 F (36.7 C) 98.2 F (36.8 C) 98.1 F (36.7 C) 98.1 F (36.7 C)  Resp: 18   18  Height:      Weight:      SpO2: 99% 96% 99% 100%  TempSrc: Oral Oral Oral Oral  BMI (Calculated):         Discharge exam  GENERAL: No apparent distress.  Nontoxic. HEENT: MMM.  Vision and hearing grossly intact.  NECK:  Supple.  No apparent JVD.  RESP:  No IWOB.  Fair aeration bilaterally. CVS:  RRR. Heart sounds normal.  ABD/GI/GU: BS+. Abd soft, NTND.  MSK/EXT:  Moves extremities. No apparent deformity. No edema.  SKIN: no apparent skin lesion or wound NEURO: Awake and alert. Oriented appropriately.  No apparent focal neuro deficit. PSYCH: Calm. Normal affect.   Discharge Instructions Discharge Instructions     [facility] pharmacist to adjust dose for Vancomycin, Aminoglycosides and other anti-infective therapies as requested by physician.   Complete by: As directed    [facility] to provide Cath Flo 2mg    Complete by: As directed    Administer for PICC line occlusion and as ordered by physician for other access device issues.   Anaphylaxis Kit: Provided to treat any anaphylactic reaction to the medication being provided to the patient if First Dose or when requested by physician   Complete by: As directed    Epinephrine  1mg /ml vial / amp: Administer 0.3mg  (0.15ml) subcutaneously once for moderate to severe anaphylaxis, nurse to call physician and pharmacy when reaction occurs and call 911 if needed for immediate care   Diphenhydramine  50mg /ml IV vial: Administer 25-50mg  IV/IM PRN for first dose reaction, rash, itching, mild reaction, nurse to call physician and pharmacy when reaction occurs   Sodium Chloride  0.9% NS 500ml IV: Administer if needed for hypovolemic blood pressure drop or as ordered by physician after call to physician with anaphylactic reaction   Change dressing on IV access line weekly and PRN   Complete by: As directed    Diet - low sodium heart healthy   Complete by: As directed    Diet Carb Modified   Complete by: As directed    Discharge instructions   Complete by: As directed    It has been a pleasure taking care of you!  You were hospitalized due to back infection for which you have been started on antibiotics.  We are discharging you on oral antibiotics  to complete treatment course.  Follow-up with infectious disease per their recommendation.   Take care,   Flush IV access with Sodium Chloride  0.9% and Heparin  10 units/ml or 100 units/ml   Complete by: As directed    Home infusion instructions - [facility]   Complete by: As directed    Instructions: Flush IV access with Sodium Chloride  0.9% and Heparin  10units/ml or 100units/ml   Change dressing on IV access line: Weekly and PRN   Instructions Cath Flo 2mg : Administer for PICC Line occlusion and as ordered by physician for other access device   [facility] pharmacist to adjust dose for: Vancomycin, Aminoglycosides and other anti-infective therapies as requested by physician   Increase activity slowly   Complete by: As directed    Method of administration may be changed at the discretion of home infusion pharmacist based upon assessment of the patient and/or caregiver's ability to self-administer the medication ordered  Complete by: As directed    No wound care   Complete by: As directed       Allergies as of 05/15/2024       Reactions   Lisinopril  Swelling, Other (See Comments)   Tongue and throat became swollen   Lyrica  [pregabalin ] Swelling, Other (See Comments)   Patient noticed swelling of the ankles and feet when taking this, so he stopped using it.        Medication List     STOP taking these medications    amoxicillin -clavulanate 875-125 MG tablet Commonly known as: AUGMENTIN    furosemide  20 MG tablet Commonly known as: LASIX    pregabalin  75 MG capsule Commonly known as: LYRICA        TAKE these medications    ampicillin  IVPB Inject 12 g into the vein daily for 26 days. As a continuous infusion. Indication:  Discitis First Dose: Yes Last Day of Therapy:  06/10/24 Labs - Once weekly:  CBC/D and BMP, Labs - Once weekly: ESR and CRP Method of administration: Ambulatory Pump (Continuous Infusion) Method of administration may be  changed at the discretion of home infusion pharmacist based upon assessment of the patient and/or caregiver's ability to self-administer the medication ordered.   apixaban  5 MG Tabs tablet Commonly known as: Eliquis  Take 1 tablet (5 mg total) by mouth 2 (two) times daily.   atorvastatin  40 MG tablet Commonly known as: LIPITOR Take 1 tablet (40 mg total) by mouth at bedtime.   B-complex with vitamin C tablet Take 1 tablet by mouth 3 (three) times a week.   EPINEPHrine  0.3 mg/0.3 mL Soaj injection Commonly known as: EPI-PEN Inject 0.3 mg into the muscle as needed for anaphylaxis.   glucose blood test strip 1 each by Other route daily. Use daily for glucose control - Dx 250.00   lidocaine  5 % Commonly known as: LIDODERM  Place 1 patch onto the skin daily. Remove & Discard patch within 12 hours or as directed by MD What changed:  when to take this reasons to take this additional instructions   methocarbamol  500 MG tablet Commonly known as: ROBAXIN  Take 1 tablet (500 mg total) by mouth every 8 (eight) hours as needed for muscle spasms.   metoprolol  succinate 25 MG 24 hr tablet Commonly known as: TOPROL -XL Take 1 tablet (25 mg total) by mouth daily. What changed: when to take this   oxyCODONE  5 MG immediate release tablet Commonly known as: Oxy IR/ROXICODONE  Take 1 tablet (5 mg total) by mouth every 6 (six) hours as needed for severe pain (pain score 7-10) (severe low back pain).   Ozempic  (1 MG/DOSE) 4 MG/3ML Sopn Generic drug: Semaglutide  (1 MG/DOSE) INJECT 1 MG UNDER THE SKIN ONCE A WEEK AS DIRECTED What changed: See the new instructions.   propranolol  10 MG tablet Commonly known as: INDERAL  TAKE 1 TABLET BY MOUTH 4 TIMES DAILY AS NEEDED FOR PALPITATIONS, IRREGULAR OR FAST HEART RATE What changed: See the new instructions.   senna-docusate 8.6-50 MG tablet Commonly known as: Senokot-S Take 1-2 tablets by mouth 2 (two) times daily between meals as needed for mild  constipation or moderate constipation.   TYLENOL  500 MG tablet Generic drug: acetaminophen  Take 500-1,000 mg by mouth every 6 (six) hours as needed for mild pain (pain score 1-3) or headache.   VITAMIN D -3 PO Take 1 tablet by mouth 3 (three) times a week.               Discharge Care Instructions  (  From admission, onward)           Start     Ordered   05/15/24 0000  Change dressing on IV access line weekly and PRN  (Home infusion instructions - [facility] )        05/15/24 1336             Procedures/Studies: 7/20-L4-L5 disc aspiration by interventional radiology   US  EKG SITE RITE Result Date: 05/15/2024 If Site Rite image not attached, placement could not be confirmed due to current cardiac rhythm.  IR LUMBAR DISC ASPIRATION W/IMG GUIDE Result Date: 05/12/2024 INDICATION: MRI suggests discitis at L4-L5. Patient presents for image guided disc aspiration. EXAM: DISC ASPIRATION AT L4-L5 USING FLUOROSCOPIC GUIDANCE MEDICATIONS: Moderate sedation ANESTHESIA/SEDATION: Moderate (conscious) sedation was employed during this procedure. A total of Versed  2 mg and Fentanyl  100 mcg was administered intravenously by the radiology nurse. Total intra-service moderate Sedation Time: 14 minutes. The patient's level of consciousness and vital signs were monitored continuously by radiology nursing throughout the procedure under my direct supervision. COMPLICATIONS: None Insert FLUOROSCOPY TIME:  Radiation Exposure Index (as provided by the fluoroscopic device): 24 mGy Kerma PROCEDURE: Informed written consent was obtained from the patient after a thorough discussion of the procedural risks, benefits and alternatives. All questions were addressed. Maximal Sterile Barrier Technique was utilized including caps, mask, sterile gowns, sterile gloves, sterile drape, hand hygiene and skin antiseptic. A timeout was performed prior to the initiation of the procedure. Patient was placed  prone. Back was prepped and draped in sterile fashion. L4-L5 disc space was identified with fluoroscopy. Right-sided approach was selected. The right side of the back was anesthetized using 1% lidocaine . Small incision was made. Using fluoroscopic guidance, an 18 gauge trocar needle was directed into the right side of the L4-L5 disc space. Needle placement confirmed with fluoroscopy. 1 mL of cloudy pink fluid was aspirated. In addition, small amount of soft tissue was also attached to the needle as it was removed. Fluid and tissue was sent for culture. Bandage placed over the puncture site. FINDINGS: Needle placement confirmed in the L4-L5 disc. IMPRESSION: Successful fluoroscopic guided disc aspiration at L4-L5. Electronically Signed   By: Juliene Balder M.D.   On: 05/12/2024 15:07   MR Lumbar Spine W Wo Contrast Result Date: 05/10/2024 CLINICAL DATA:  Provided history: Low back pain, infection suspected, positive x-ray/CT. Concern for osteomyelitis/discitis (L4-L5). EXAM: MRI LUMBAR SPINE WITHOUT AND WITH CONTRAST TECHNIQUE: Multiplanar and multiecho pulse sequences of the lumbar spine were obtained without and with intravenous contrast. CONTRAST:  10mL GADAVIST  GADOBUTROL  1 MMOL/ML IV SOLN COMPARISON:  CT chest/abdomen/pelvis 05/07/2024. Lumbar spine MRI 03/27/2024. FINDINGS: Segmentation: 5 lumbar vertebrae. The caudal most well-formed tibial disc space is designated L5-S1. Alignment: Mild grade 1 retrolisthesis at L1-L2, L2-L3, L3-L4 and L4-L5. Mild grade 1 anterolisthesis at L5-S1. Vertebrae: New from the prior lumbar spine MRI 03/27/2024, there is prominent T1 hypointense and STIR hyperintense signal abnormality within the L4 and L5 vertebral bodies, centered along the L4-L5 disc space, with corresponding extensive pathologic enhancement. Signal abnormality and pathologic enhancement extends into the posterior elements on the left at L5. Additionally, there is new endplate irregularity and disc signal  abnormality at L4-L5. This constellation of findings is consistent with sequela of discitis/osteomyelitis. To a lesser degree, there is new edema signal and enhancement within the S1 vertebral body and there is subtle abnormal enhancement along the ventral aspect of the L5-S1 disc. These findings likely reflect early involvement  of the L5-S1 disc and S1 vertebral body. Also new from the prior MRI, there is ill-defined abnormal enhancement within the ventral epidural space at the L4-L5 disc level and L5 vertebral body level, likely reflecting epidural phlegmon (series 11, image 10). No well-defined epidural abscess is present at this time. There is no more than mild spinal canal narrowing at the L5 vertebral body level. Conus medullaris and cauda equina: Conus extends to the L1-L2 level. Signal abnormality identified within the visualized distal spinal cord. No pathologic enhancement of the cauda equina nerve roots. Paraspinal and other soft tissues: Abnormal enhancing soft tissue along the ventral and lateral aspects of the L4-L5 vertebrae (right greater than left) consistent with paraspinal soft tissue infection/phlegmon. Additionally, there is right psoas edema and enhancement consistent with right psoas involvement and myositis. No appreciable right psoas abscess. Disc levels: A caudally migrated right subarticular disc extrusion at L3-L4 has somewhat regressed. This contributes to moderate to severe right subarticular stenosis, slightly improved from the prior exam. Lumbar spondylosis has otherwise not significantly changed from the recent prior lumbar spine MRI of 03/27/2024. IMPRESSION: 1. Findings consistent with discitis/osteomyelitis at the L4-L5 and L5-S1 levels, as detailed within the body of the report. Abnormal enhancement also present within the ventral epidural space at the L4-L5 disc level, and L5 vertebral body level, likely reflecting epidural phlegmon. No well-defined epidural abscess is present  at this time. 2. Associated paraspinal soft tissue infection/phlegmon centered at L4-L5 with involvement of the right psoas muscle. No appreciable right psoas abscess. 3. A caudally migrated right subarticular disc extrusion at L3-L4 has somewhat regressed. This contributes to moderate-to-severe right subarticular stenosis, which has slightly improved. Lumbar spondylosis has otherwise not significantly changed from the recent prior lumbar spine MRI of 03/27/2024. Please refer to this prior examination for further description. Electronically Signed   By: Rockey Childs D.O.   On: 05/10/2024 17:53   DG INJECT DIAG/THERA/INC NEEDLE/CATH/PLC EPI/LUMB/SAC W/IMG Result Date: 04/23/2024 CLINICAL DATA:  Lumbosacral spondylosis without myelopathy. Lumbar radiculopathy. Low back pain and right leg pain including in the anterior thigh and knee. Disc herniation at L3-4 with right lateral recess stenosis. FLUOROSCOPY: Radiation Exposure Index (as provided by the fluoroscopic device): 0.60 mGy Kerma PROCEDURE: The procedure, risks, benefits, and alternatives were explained to the patient. Questions regarding the procedure were encouraged and answered. The patient understands and consents to the procedure. LUMBAR EPIDURAL INJECTION: An interlaminar approach was performed on the right at L3-4. The overlying skin was cleansed and anesthetized. A 3.5 inch 20 gauge epidural needle was advanced using loss-of-resistance technique. DIAGNOSTIC EPIDURAL INJECTION Injection of Isovue -M 200 shows a good epidural pattern with spread above and below the level of needle placement primarily on the right. No vascular opacification is seen. THERAPEUTIC EPIDURAL INJECTION: 80 mg of Depo-Medrol  mixed with 2 mL of 1% lidocaine  were instilled. The procedure was well-tolerated, and the patient was discharged thirty minutes following the injection in good condition. COMPLICATIONS: None immediate IMPRESSION: Technically successful interlaminar epidural  injection on the right at L3-4. Electronically Signed   By: Dasie Hamburg M.D.   On: 04/23/2024 15:06       The results of significant diagnostics from this hospitalization (including imaging, microbiology, ancillary and laboratory) are listed below for reference.     Microbiology: Recent Results (from the past 240 hours)  Blood culture (routine x 2)     Status: None   Collection Time: 05/10/24  6:50 PM   Specimen: BLOOD  Result Value Ref  Range Status   Specimen Description BLOOD LEFT ANTECUBITAL  Final   Special Requests   Final    BOTTLES DRAWN AEROBIC AND ANAEROBIC Blood Culture adequate volume   Culture   Final    NO GROWTH 5 DAYS Performed at [facility], [address] [place], [place] [address]    Report Status 05/15/2024 FINAL  Final  Blood culture (routine x 2)     Status: None   Collection Time: 05/10/24  7:05 PM   Specimen: BLOOD LEFT FOREARM  Result Value Ref Range Status   Specimen Description BLOOD LEFT FOREARM  Final   Special Requests   Final    BOTTLES DRAWN AEROBIC AND ANAEROBIC Blood Culture adequate volume   Culture   Final    NO GROWTH 5 DAYS Performed at [facility], [address], [place], [place] [address]    Report Status 05/15/2024 FINAL  Final  Body fluid culture w Gram Stain     Status: None (Preliminary result)   Collection Time: 05/12/24 12:20 PM   Specimen: Other Source; Body Fluid  Result Value Ref Range Status   Specimen Description OTHER  Final   Special Requests IDISC  Final   Gram Stain   Final    RARE WBC PRESENT, PREDOMINANTLY PMN NO ORGANISMS SEEN Performed at [facility], [address], [place], [place] [address]    Culture FEW ENTEROCOCCUS FAECALIS  Final   Report Status PENDING  Incomplete   Organism ID, Bacteria ENTEROCOCCUS FAECALIS  Final      Susceptibility   Enterococcus faecalis - MIC*    AMPICILLIN  <=2 SENSITIVE Sensitive     VANCOMYCIN 2 SENSITIVE Sensitive     GENTAMICIN  SYNERGY SENSITIVE  Sensitive     * FEW ENTEROCOCCUS FAECALIS  Aerobic/Anaerobic Culture w Gram Stain (surgical/deep wound)     Status: None (Preliminary result)   Collection Time: 05/12/24 12:21 PM   Specimen: Other Source; Tissue  Result Value Ref Range Status   Specimen Description OTHER  Final   Special Requests IDISC  Final   Gram Stain   Final    FEW WBC PRESENT, PREDOMINANTLY PMN RARE GRAM POSITIVE COCCI Performed at [facility], [address], [place], [place] [address]    Culture   Final    ABUNDANT ENTEROCOCCUS FAECALIS NO ANAEROBES ISOLATED; CULTURE IN PROGRESS FOR 5 DAYS    Report Status PENDING  Incomplete   Organism ID, Bacteria ENTEROCOCCUS FAECALIS  Final      Susceptibility   Enterococcus faecalis - MIC*    AMPICILLIN  <=2 SENSITIVE Sensitive     VANCOMYCIN 2 SENSITIVE Sensitive     GENTAMICIN  SYNERGY SENSITIVE Sensitive     * ABUNDANT ENTEROCOCCUS FAECALIS     Labs:  CBC: Recent Labs  Lab 05/09/24 1327 05/10/24 1423 05/11/24 0446 05/12/24 0532 05/13/24 0622 05/15/24 0550  WBC 9.9 8.3 8.6 7.6 7.4 6.9  NEUTROABS 7.2 5.7  --   --   --  4.3  HGB 12.6* 12.7* 10.9* 11.3* 11.2* 11.1*  HCT 37.4* 38.9* 33.1* 34.2* 33.6* 33.1*  MCV 80.4 82.9 82.5 81.6 82.4 81.5  PLT 248 245 202 180 180 182   BMP &GFR Recent Labs  Lab 05/10/24 1423 05/11/24 0446 05/12/24 0532 05/13/24 0622 05/15/24 0550  NA 134* 133* 134* 132* 133*  K 4.8 3.8 3.9 3.9 4.0  CL 98 103 103 99 103  CO2 28 24 23 25  21*  GLUCOSE 225* 182* 118*  136* 135*  BUN 16 14 15 15 17   CREATININE 1.22 1.15 1.05 1.08 1.09  CALCIUM  9.4 8.5* 8.8* 8.7* 8.9   Estimated Creatinine Clearance: 72.1 mL/min (by C-G formula based on SCr of 1.09 mg/dL). Liver & Pancreas: Recent Labs  Lab 05/09/24 1327 05/11/24 0446  AST 13* 14*  ALT 13 12  ALKPHOS 96 75  BILITOT 0.4 0.5  PROT 7.3 6.0*  ALBUMIN 3.8 2.6*   No results for input(s): LIPASE, AMYLASE in the last 168 hours. No results for input(s): AMMONIA in  the last 168 hours. Diabetic: No results for input(s): HGBA1C in the last 72 hours. Recent Labs  Lab 05/14/24 1651 05/14/24 2114 05/15/24 0744 05/15/24 1201 05/15/24 1646  GLUCAP 130* 133* 147* 193* 145*   Cardiac Enzymes: Recent Labs  Lab 05/14/24 0738  CKTOTAL 18*   No results for input(s): PROBNP in the last 8760 hours. Coagulation Profile: Recent Labs  Lab 05/11/24 1211  INR 1.0   Thyroid  Function Tests: No results for input(s): TSH, T4TOTAL, FREET4, T3FREE, THYROIDAB in the last 72 hours. Lipid Profile: No results for input(s): CHOL, HDL, LDLCALC, TRIG, CHOLHDL, LDLDIRECT in the last 72 hours. Anemia Panel: No results for input(s): VITAMINB12, FOLATE, FERRITIN, TIBC, IRON, RETICCTPCT in the last 72 hours. Urine analysis:    Component Value Date/Time   COLORURINE YELLOW 03/27/2024 1406   APPEARANCEUR HAZY (A) 03/27/2024 1406   LABSPEC 1.017 03/27/2024 1406   PHURINE 5.0 03/27/2024 1406   GLUCOSEU 150 (A) 03/27/2024 1406   GLUCOSEU 100 (A) 02/27/2023 1547   HGBUR SMALL (A) 03/27/2024 1406   HGBUR negative 12/07/2009 0811   BILIRUBINUR NEGATIVE 03/27/2024 1406   BILIRUBINUR neg 04/29/2021 1443   KETONESUR NEGATIVE 03/27/2024 1406   PROTEINUR 100 (A) 03/27/2024 1406   UROBILINOGEN 0.2 02/27/2023 1547   NITRITE POSITIVE (A) 03/27/2024 1406   LEUKOCYTESUR MODERATE (A) 03/27/2024 1406   Sepsis Labs: Invalid input(s): PROCALCITONIN, LACTICIDVEN   SIGNED:  Tayshun Gappa T Tiyon Sanor, MD  [facility] 05/15/2024, 6:04 PM

## 2024-05-16 ENCOUNTER — Telehealth: Payer: Self-pay | Admitting: *Deleted

## 2024-05-16 ENCOUNTER — Ambulatory Visit: Payer: Self-pay

## 2024-05-16 ENCOUNTER — Ambulatory Visit: Admitting: Physical Therapy

## 2024-05-16 DIAGNOSIS — I4811 Longstanding persistent atrial fibrillation: Secondary | ICD-10-CM | POA: Diagnosis not present

## 2024-05-16 DIAGNOSIS — E1169 Type 2 diabetes mellitus with other specified complication: Secondary | ICD-10-CM | POA: Diagnosis not present

## 2024-05-16 DIAGNOSIS — E785 Hyperlipidemia, unspecified: Secondary | ICD-10-CM | POA: Diagnosis not present

## 2024-05-16 DIAGNOSIS — Z792 Long term (current) use of antibiotics: Secondary | ICD-10-CM | POA: Diagnosis not present

## 2024-05-16 DIAGNOSIS — I129 Hypertensive chronic kidney disease with stage 1 through stage 4 chronic kidney disease, or unspecified chronic kidney disease: Secondary | ICD-10-CM | POA: Diagnosis not present

## 2024-05-16 DIAGNOSIS — M4626 Osteomyelitis of vertebra, lumbar region: Secondary | ICD-10-CM | POA: Diagnosis not present

## 2024-05-16 DIAGNOSIS — E1122 Type 2 diabetes mellitus with diabetic chronic kidney disease: Secondary | ICD-10-CM | POA: Diagnosis not present

## 2024-05-16 DIAGNOSIS — N183 Chronic kidney disease, stage 3 unspecified: Secondary | ICD-10-CM | POA: Diagnosis not present

## 2024-05-16 DIAGNOSIS — Z452 Encounter for adjustment and management of vascular access device: Secondary | ICD-10-CM | POA: Diagnosis not present

## 2024-05-16 LAB — BODY FLUID CULTURE W GRAM STAIN

## 2024-05-16 NOTE — Telephone Encounter (Signed)
 Copied from CRM [id number]. Topic: Clinical - Red Word Triage >> May 16, 2024  2:18 PM Thersia BROCKS wrote: Red Word that prompted transfer to Nurse Triage: [facility] provider from [facility] called in regarding patient needing to speak with nurses or Dr.Plotnikov . Stated that patient has been in pain and called her but unfortunately he would need to speak and get in touch with his primary care provider, she stated she wanted to know if a nurse could reach out to patient to assist.    ----------------------------------------------------------------------- From previous Reason for Contact - Pink Word Triage: Reason for Triage: Answer Assessment - Initial Assessment Questions 1. REASON FOR CALL or QUESTION: What is your reason for calling today? or How can I best     This RN contacted patient per urologist request. Patient was not sure why we were calling, he stated that he was already in the ED for his back pain and that it is chronic. States he already has a hospital f/u scheduled and has no new complaints. Advised patient to call back with any changes.  Protocols used: PCP Call - No Triage-A-AH

## 2024-05-16 NOTE — Patient Instructions (Signed)
 Visit Information  Thank you for taking time to visit with me today. Please don't hesitate to contact me if I can be of assistance to you before our next scheduled telephone appointment.  Our next appointment is by telephone on Tuesday 05/21/24 at 2:00 pm   Please call the care guide team at [phone] if you need to cancel or reschedule your appointment.   Patient Self Care Activities:  Attend all scheduled provider appointments Call pharmacy for medication refills 3-7 days in advance of running out of medications Call provider office for new concerns or questions  Take medications as prescribed  Participate in [facility] 30-day program: weekly phone calls with nurse care manager  Continue working with the home health team that is involved in your care Continue pacing activity to avoid and minimize pain  Continue to use assistive devices as needed to prevent falls- your walker If you believe your condition is getting worse- contact your care providers (doctors) promptly- reaching out to your doctor early when you have concerns can prevent you from having to go to the hospital Continue to monitor your blood pressures, weights, oxygen levels, and blood sugars at home and write them down on paper  Following is a copy of your care plan:   Goals Addressed             This Visit's Progress    [facility] Transitions of Care (TOC) Care Plan   On track    Problems:  Recent Hospitalization for treatment of Lumbar radiculopathy affecting patient's ambulation related to pain - Patient also reports infection and states his port was removed Re-hospitalization post- recent MRI that indicated 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  Most recent hospitalization: July 18-23, 2025 for acute osteomyelitis of lumbar spine with PICC line insertion for home IV antibiotic administration Home Health services barrier: confirms has IV antibiotics delievered to his home; is expecting home health nurse to arrive this  afternoon to initiate services for antibiotic therapy at home x 8 weeks; care coordination outreach placed to [place] home health as per initial TOC note: confirmed services will start this afternoon with scheduled visit at 3:00 pm: called patient and updated him around same  4 unplanned hospitalizations x last (4)/ (6)/ and (12) months- since April 2025  Goal:  Over the next 30 days, the patient will not experience hospital readmission  Interventions:  Transitions of Care: week # 1/ day #1: TOC program initiation Durable Medical Equipment (DME) needs assessed with patient/caregiver Doctor Visits  - discussed the importance of doctor visits Communication with PCP and established [facility] RN CM re: re-enrollment into [facility] TOC 30-day program Arranged PCP follow-up within 7 days (Care Guide Scheduled) Contacted Health RN/OT/PT - care coordination outreach completed to [place] home health to confirm start of services this afternoon Post discharge activity limitations prescribed by provider reviewed Post-op wound/incision care reviewed with patient/caregiver Reviewed Signs and symptoms of infection Reviewed upcoming provider office visits: 05/23/24: PCP for hospital follow up; 06/10/24:  ID provider; 06/06/24: oncology provider:  confirmed patient is aware of all and has plans to attend as scheduled scheduled Provided reinforcement re: common side effects of narcotic pain medicine; need to use pain medicine as prescribed/ as needed; safe use of opioid medication Provided education around signs/ symptoms infection for both new PICC line and urostomy/ UTI along with corresponding action plan: patient verbalizes very good understanding of same without prompting Provided education around expected/ usual side effects of antibiotics  Provided education around role of  home health services with importance of participation/ ongoing engagement: need to make sure he understands how to contact home health team if  concerns around PICC line or IV home infusion occur: her verbalizes understanding and agreement Confirmed currently requiring/ using assistive devices for ambulation - walker; provided education/ reinforcement around fall prevention   Patient Self Care Activities:  Attend all scheduled provider appointments Call pharmacy for medication refills 3-7 days in advance of running out of medications Call provider office for new concerns or questions  Take medications as prescribed  Participate in [facility] 30-day program: weekly phone calls with nurse care manager  Continue working with the home health team that is involved in your care Continue pacing activity to avoid and minimize pain  Continue to use assistive devices as needed to prevent falls- your walker If you believe your condition is getting worse- contact your care providers (doctors) promptly- reaching out to your doctor early when you have concerns can prevent you from having to go to the hospital Continue to monitor your blood pressures, weights, oxygen levels, and blood sugars at home and write them down on paper  Plan:  Telephone follow up appointment with care management team member scheduled for:  Tuesday 05/21/24 at 2:00 pm         Patient verbalizes understanding of instructions and care plan provided today and agrees to view in MyChart. Active MyChart status and patient understanding of how to access instructions and care plan via MyChart confirmed with patient.     If you are experiencing a Mental Health or Behavioral Health Crisis or need someone to talk to, please  call the Suicide and Crisis Lifeline: 988 call the [place]  National Suicide Prevention Lifeline: 727-754-5212 or TTY: (334) 079-7138 TTY 810-640-2899) to talk to a trained counselor call 1-800-273-TALK (toll free, 24 hour hotline) go to [facility] [address] [place] [phone]) call the [facility] Crisis Line:  [phone] call 911   Beatris Blinda Lawrence, RN, BSN, CCRN Alumnus RN Care Manager  Transitions of Care  [facility] - Population Health  [place] [phone]: direct office

## 2024-05-16 NOTE — Transitions of Care (Post Inpatient/ED Visit) (Signed)
 05/16/2024  Name: Robert [name] MRN: [id number] DOB: 1951-08-06  Today's TOC FU Call Status: Today's TOC FU Call Status:: Successful TOC FU Call Completed TOC FU Call Complete Date: 05/16/24 Patient's Name and Date of Birth confirmed.  Transition Care Management Follow-up Telephone Call Date of Discharge: 05/15/24 Discharge Facility: [facility]) Type of Discharge: Inpatient Admission Primary Inpatient Discharge Diagnosis:: Acute osteomyelitis- lumbar spine How have you been since you were released from the hospital?: Same (I guess I am better; still in discomfort, waiting on the IV nurses to come by and help me get started on these IV antibiotics.  Using my walker; my wife's caregiver's are still helping me too with anything I need) Any questions or concerns?: No  Items Reviewed: Did you receive and understand the discharge instructions provided?: Yes (thoroughly reviewed with patient who verbalizes good understanding of same) Medications obtained,verified, and reconciled?: Yes (Medications Reviewed) (Full medication reconciliation/ review completed; no concerns or discrepancies identified; confirmed patient obtained/ is taking all newly Rx'd medications as instructed; self-manages medications and denies questions/ concerns around medications today) Any new allergies since your discharge?: No Dietary orders reviewed?: Yes Type of Diet Ordered:: I eat as healthy as I can Do you have support at home?: Yes People in Home [RPT]: spouse Name of Support/Comfort Primary Source: Reports independent in self-care activities; resides with spouse who has dementia: patient is primary caregiver for spouse; his spouse also has private duty caregivers who also assist him with care needs as/ if needed/ indicated  Medications Reviewed Today: Medications Reviewed Today     Reviewed by Tuwana Kapaun M, RN (Registered Nurse) on 05/16/24 at 1228  Med List Status: <None>   Medication Order  Taking? Sig Documenting Provider Last Dose Status Informant  ampicillin  IVPB [id number] Yes Inject 12 g into the vein daily for 26 days. As a continuous infusion. Indication:  Discitis First Dose: Yes Last Day of Therapy:  06/10/24 Labs - Once weekly:  CBC/D and BMP, Labs - Once weekly: ESR and CRP Method of administration: Ambulatory Pump (Continuous Infusion) Method of administration may be changed at the discretion of home infusion pharmacist based upon assessment of the patient and/or caregiver's ability to self-administer the medication ordered. Gonfa, Taye T, MD  Active   apixaban  (ELIQUIS ) 5 MG TABS tablet [id number] Yes Take 1 tablet (5 mg total) by mouth 2 (two) times daily. Nahser, Aleene PARAS, MD  Active Self  atorvastatin  (LIPITOR) 40 MG tablet [id number] Yes Take 1 tablet (40 mg total) by mouth at bedtime. Cheryle Page, MD  Active Self           Med Note MARISA, NATHANEL SAILOR   Fri May 10, 2024  5:30 PM) The patient stated he is taking these. I don't see any filled recently.  B Complex-C (B-COMPLEX WITH VITAMIN C) tablet [id number] Yes Take 1 tablet by mouth 3 (three) times a week. [provider]  Active Self           Med Note ROLENE MNIMPHLZS, SUSAN A   Sun Feb 04, 2024  4:51 PM)    Cholecalciferol  (VITAMIN D -3 PO) [id number] Yes Take 1 tablet by mouth 3 (three) times a week. [provider]  Active Self           Med Note MARISA, NATHANEL SAILOR   Fri May 10, 2024  5:27 PM) Strength not known  EPINEPHrine  0.3 mg/0.3 mL IJ SOAJ injection [id number] Yes Inject 0.3 mg into the muscle as needed  for anaphylaxis. Victor Lynwood DASEN, PA-C  Active Self  glucose blood test strip [id number] Yes 1 each by Other route daily. Use daily for glucose control - Dx 250.00 Theophilus Andrews, Tully GRADE, MD  Active Self  lidocaine  (LIDODERM ) 5 % [id number] Yes Place 1 patch onto the skin daily. Remove & Discard patch within 12 hours or as directed by MD Arlon Carliss ORN, DO  Active Self           Med Note  MARISA, NATHANEL SAILOR   Fri May 10, 2024  5:28 PM) No patches are on at this time  methocarbamol  (ROBAXIN ) 500 MG tablet [id number] Yes Take 1 tablet (500 mg total) by mouth every 8 (eight) hours as needed for muscle spasms. Arlon Carliss ORN, DO  Active Self  metoprolol  succinate (TOPROL -XL) 25 MG 24 hr tablet [id number] Yes Take 1 tablet (25 mg total) by mouth daily. Nahser, Aleene PARAS, MD  Active Self  oxyCODONE  (OXY IR/ROXICODONE ) 5 MG immediate release tablet [id number] Yes Take 1 tablet (5 mg total) by mouth every 6 (six) hours as needed for severe pain (pain score 7-10) (severe low back pain). Plotnikov, Aleksei V, MD  Active Self  propranolol  (INDERAL ) 10 MG tablet [id number] Yes TAKE 1 TABLET BY MOUTH 4 TIMES DAILY AS NEEDED FOR PALPITATIONS, IRREGULAR OR FAST HEART RATE Nahser, Aleene PARAS, MD  Active Self           Med Note MARISA, NATHANEL SAILOR   Fri May 10, 2024  5:08 PM)    Semaglutide , 1 MG/DOSE, (OZEMPIC , 1 MG/DOSE,) 4 MG/3ML SOPN [id number] Yes INJECT 1 MG UNDER THE SKIN ONCE A WEEK AS DIRECTED Trixie File, MD  Active Self           Med Note MARISA, NATHANEL SAILOR   Fri Mar 01, 2024  5:59 PM)    senna-docusate (SENOKOT-S) 8.6-50 MG tablet [id number] Yes Take 1-2 tablets by mouth 2 (two) times daily between meals as needed for mild constipation or moderate constipation. Gonfa, Taye T, MD  Active   sodium chloride  flush (NS) 0.9 % injection 10 mL [id number]   Lanny Callander, MD  Active   TYLENOL  500 MG tablet [id number] Yes Take 500-1,000 mg by mouth every 6 (six) hours as needed for mild pain (pain score 1-3) or headache. [provider]  Active Self           Home Care and Equipment/Supplies: Were Home Health Services Ordered?: Yes Name of Home Health Agency:: [facility]- for IV infusion medications and equipment; [facility]- for RN for home IV infusion management Has Agency set up a time to come to your home?: Yes [facility]: spoke with Prentice; he assisted with having home health RN  Therisa to call me back- she left voice mail message confirming start of services for IV infusion scheduled at patient's home for today at 3 pm; updated patient accordingly) First Home Health Visit Date: 05/16/24 (care coordination outreach completed with [facility] team to confirm active referral along with start of service for today 05/16/24 at 3:00 pm) Any new equipment or medical supplies ordered?: Yes (IV equipment for home infusion: antibiotics) Name of Medical supply agency?: [facility]: [phone] Were you able to get the equipment/medical supplies?: Yes Do you have any questions related to the use of the equipment/supplies?: Yes What questions do you have?: I am just waiting for the nurse to come out and show me how to use all of the IV stuff now that I am  at home; they are supposed to come today; Care Coordination outreach as above to [facility] home health: confirmed start of service for IV infusion scheduled for this afternoon 05/16/24 at 3:00 pm  Functional Questionnaire: Do you need assistance with bathing/showering or dressing?: No Do you need assistance with meal preparation?: No Do you need assistance with eating?: No Do you have difficulty maintaining continence: No Do you need assistance with getting out of bed/getting out of a chair/moving?: No Do you have difficulty managing or taking your medications?: No  Follow up appointments reviewed: PCP Follow-up appointment confirmed?: Yes (care coordination outreach in real-time with scheduling care guide to successfully schedule hospital follow up PCP appointment 05/23/24) Date of PCP follow-up appointment?: 05/23/24 Follow-up Provider: PCP- Dr. Garald Specialist [facility] Follow-up appointment confirmed?: Yes Date of Specialist follow-up appointment?: 06/10/24 Follow-Up Specialty Provider:: ID specialist: Dr. Dennise Do you need transportation to your follow-up appointment?: No Do you understand care options if your condition(s)  worsen?: Yes-patient verbalized understanding  SDOH Interventions Today    Flowsheet Row Most Recent Value  SDOH Interventions   Food Insecurity Interventions Intervention Not Indicated  Housing Interventions Intervention Not Indicated  Transportation Interventions Intervention Not Indicated  [Private duty caregivers for patient's spouse assist with transportation of patient andhis spouse,  friends and neighbors also assist as needed]  Utilities Interventions Intervention Not Indicated   See TOC assessment tabs for additional assessment/ TOC intervention information  Successfully enrolled into 30-day TOC program: confirmed patient has my direct contact number should needs arise prior to next scheduled TOC outreach   Pls call/ message for questions,  Eann Cleland [name] Nicasio Barlowe, RN, BSN, Media planner  Transitions of Care  [facility] - [facility] [phone]: direct office

## 2024-05-18 DIAGNOSIS — M4640 Discitis, unspecified, site unspecified: Secondary | ICD-10-CM | POA: Diagnosis not present

## 2024-05-18 LAB — AEROBIC/ANAEROBIC CULTURE W GRAM STAIN (SURGICAL/DEEP WOUND)

## 2024-05-20 DIAGNOSIS — Z792 Long term (current) use of antibiotics: Secondary | ICD-10-CM | POA: Diagnosis not present

## 2024-05-20 DIAGNOSIS — I4811 Longstanding persistent atrial fibrillation: Secondary | ICD-10-CM | POA: Diagnosis not present

## 2024-05-20 DIAGNOSIS — Z452 Encounter for adjustment and management of vascular access device: Secondary | ICD-10-CM | POA: Diagnosis not present

## 2024-05-20 DIAGNOSIS — E1169 Type 2 diabetes mellitus with other specified complication: Secondary | ICD-10-CM | POA: Diagnosis not present

## 2024-05-20 DIAGNOSIS — E1122 Type 2 diabetes mellitus with diabetic chronic kidney disease: Secondary | ICD-10-CM | POA: Diagnosis not present

## 2024-05-20 DIAGNOSIS — E785 Hyperlipidemia, unspecified: Secondary | ICD-10-CM | POA: Diagnosis not present

## 2024-05-20 DIAGNOSIS — M4626 Osteomyelitis of vertebra, lumbar region: Secondary | ICD-10-CM | POA: Diagnosis not present

## 2024-05-20 DIAGNOSIS — I129 Hypertensive chronic kidney disease with stage 1 through stage 4 chronic kidney disease, or unspecified chronic kidney disease: Secondary | ICD-10-CM | POA: Diagnosis not present

## 2024-05-20 DIAGNOSIS — N183 Chronic kidney disease, stage 3 unspecified: Secondary | ICD-10-CM | POA: Diagnosis not present

## 2024-05-21 ENCOUNTER — Encounter: Payer: Self-pay | Admitting: *Deleted

## 2024-05-21 ENCOUNTER — Telehealth: Payer: Self-pay | Admitting: *Deleted

## 2024-05-21 DIAGNOSIS — C61 Malignant neoplasm of prostate: Secondary | ICD-10-CM | POA: Diagnosis not present

## 2024-05-21 DIAGNOSIS — R8271 Bacteriuria: Secondary | ICD-10-CM | POA: Diagnosis not present

## 2024-05-21 DIAGNOSIS — C775 Secondary and unspecified malignant neoplasm of intrapelvic lymph nodes: Secondary | ICD-10-CM | POA: Diagnosis not present

## 2024-05-21 DIAGNOSIS — C678 Malignant neoplasm of overlapping sites of bladder: Secondary | ICD-10-CM | POA: Diagnosis not present

## 2024-05-21 DIAGNOSIS — M4640 Discitis, unspecified, site unspecified: Secondary | ICD-10-CM | POA: Diagnosis not present

## 2024-05-21 DIAGNOSIS — C661 Malignant neoplasm of right ureter: Secondary | ICD-10-CM | POA: Diagnosis not present

## 2024-05-22 ENCOUNTER — Telehealth: Payer: Self-pay | Admitting: *Deleted

## 2024-05-22 ENCOUNTER — Telehealth

## 2024-05-22 ENCOUNTER — Encounter: Payer: Self-pay | Admitting: *Deleted

## 2024-05-22 DIAGNOSIS — M4646 Discitis, unspecified, lumbar region: Secondary | ICD-10-CM | POA: Diagnosis not present

## 2024-05-23 ENCOUNTER — Telehealth: Payer: Self-pay | Admitting: *Deleted

## 2024-05-23 ENCOUNTER — Encounter: Payer: Self-pay | Admitting: Internal Medicine

## 2024-05-23 ENCOUNTER — Telehealth: Admitting: *Deleted

## 2024-05-23 ENCOUNTER — Ambulatory Visit: Admitting: Internal Medicine

## 2024-05-23 VITALS — BP 140/82 | HR 92 | Temp 98.6°F | Ht 75.0 in | Wt 189.0 lb

## 2024-05-23 DIAGNOSIS — M4647 Discitis, unspecified, lumbosacral region: Secondary | ICD-10-CM | POA: Diagnosis not present

## 2024-05-23 DIAGNOSIS — N1831 Chronic kidney disease, stage 3a: Secondary | ICD-10-CM | POA: Diagnosis not present

## 2024-05-23 DIAGNOSIS — M5441 Lumbago with sciatica, right side: Secondary | ICD-10-CM

## 2024-05-23 DIAGNOSIS — C675 Malignant neoplasm of bladder neck: Secondary | ICD-10-CM | POA: Diagnosis not present

## 2024-05-23 DIAGNOSIS — M4640 Discitis, unspecified, site unspecified: Secondary | ICD-10-CM | POA: Diagnosis not present

## 2024-05-23 MED ORDER — TRAMADOL HCL 50 MG PO TABS: 50.0000 mg | ORAL_TABLET | Freq: Three times a day (TID) | ORAL | 1 refills | Status: DC | PRN

## 2024-05-23 NOTE — Progress Notes (Signed)
 Subjective:  Patient ID: Robert [name], male    DOB: [birth year]  Age: 73 y.o. MRN: [id number]  CC: Hospitalization Follow-up   HPI Robert [name] presents for diskitis, bladder cancer, LBP and low Mag f/u  Per hx: Admit date: 05/10/2024 Discharge date: 05/15/24   Admitted From: Home Disposition: Home Recommendations for Outpatient Follow-up:  Outpatient follow-up with PCP and infectious disease as below Check CBC with differential, BMP, CRP and ESR weekly and fax results to ID at [phone] Please follow up on the following pending results: None   Home Health: [facility] RN for IV antibiotics Equipment/Devices: None   Discharge Condition: Stable CODE STATUS: Full code   Follow-up Information       Dennise Kingsley, MD Follow up.   Specialty: Infectious Diseases Why: 06/10/24 at 2 pm. Please call to reschedule if you are not able to make this appointment. Contact information: [address], [address] [place] [address] [phone]              Emmry Hinsch, Karlynn GAILS, MD. Schedule an appointment as soon as possible for a visit in 1 week(s).   Specialty: Internal Medicine Contact information: [address] [place] [address] [phone]                          Hospital course 73 y.o. male with MH of PAF on Eliquis , CKD-3, DM-2, HTN, HLD, bladder cancer s/p cystoscopy and prostatectomy with right nephroureterectomy and total urethrectomy, and chemotherapy, recent bacteremia with  beta-lactamase positive Bactroides and Enterococcus faecalis on June 7 with similar bacteria in the urine who was on IV antibiotics and transition to oral antibiotics.     Patient presented in early July with back pain at which point MRI of the lumbar spine showed no concern for infection.  Again had the Enterococcus faecalis in urine as well as blood.  Patient was evaluated with TTE and TEE but no vegetation.  Was discharged on oral amoxicillin  for 2 weeks after  port was removed.  He has surveillance blood cultures with infectious disease, Dr. Dennise with no apparent infection. On the day of presentation, patient's MRI of the lumbar spine showed evidence of discitis and osteomyelitis at L4-L5 and L5-S1 levels.  There is also soft tissue phlegmon at the level with involvement of the right psoas muscle. He underwent disc aspiration on 05/12/2024.  Aspirate culture grew Enterococcus faecalis.  Antibiotics de-escalated to IV ampicillin .  PICC line placed on 7/23.  Patient is discharged on IV ampicillin  12 g daily until 06/10/2024 per recommendation by ID.  Weekly labs and ID follow-up as above.   See individual problem list below for more.    Problems addressed during this hospitalization Acute L4-L5 discitis and Osteomyelitis with epidural phlegmon,POA: -S/p IR disc aspiration 7/20. This was done off of antibiotics. -Blood cultures 7/18 showed NGTD -Aspirate culture with pansensitive Enterococcus faecalis. -Cleared for discharge by ID on IV ampicillin  until 06/10/2024. -Weekly labs as above. - Outpatient follow-up with PCP and ID as above   Type 2 diabetes mellitus, non insulin  dependent,POA: - Continue home Ozempic .   Long standing persistent atrial fibrillation,POA: - Eliquis  5 mg PO bid - Toprol  XL 25 mg PO daily     Hypertension: - On Toprol  XL 25 mg PO daily    Hyperlipidemia  - On Lipitor 40 mg PO at bedtime    Hx of bladder CA  - Outpt  follow up with urology and oncology     Body mass index is 23.75 kg/m.     Consultations: Infectious disease Interventional radiology    Outpatient Medications Prior to Visit  Medication Sig Dispense Refill   ampicillin  (PRINCIPEN) 500 MG capsule Take 500 mg by mouth 3 (three) times daily.     ampicillin  IVPB Inject 12 g into the vein daily for 26 days. As a continuous infusion. Indication:  Discitis First Dose: Yes Last Day of Therapy:  06/10/24 Labs - Once weekly:  CBC/D and BMP, Labs - Once  weekly: ESR and CRP Method of administration: Ambulatory Pump (Continuous Infusion) Method of administration may be changed at the discretion of home infusion pharmacist based upon assessment of the patient and/or caregiver's ability to self-administer the medication ordered. 26 Units 0   apixaban  (ELIQUIS ) 5 MG TABS tablet Take 1 tablet (5 mg total) by mouth 2 (two) times daily. 180 tablet 1   atorvastatin  (LIPITOR) 40 MG tablet Take 1 tablet (40 mg total) by mouth at bedtime.     B Complex-C (B-COMPLEX WITH VITAMIN C) tablet Take 1 tablet by mouth 3 (three) times a week.     Cholecalciferol  (VITAMIN D -3 PO) Take 1 tablet by mouth 3 (three) times a week.     EPINEPHrine  0.3 mg/0.3 mL IJ SOAJ injection Inject 0.3 mg into the muscle as needed for anaphylaxis. 1 each 0   gabapentin (NEURONTIN) 300 MG capsule Take 300 mg by mouth 3 (three) times daily.     glucose blood test strip 1 each by Other route daily. Use daily for glucose control - Dx 250.00 100 each 0   lidocaine  (LIDODERM ) 5 % Place 1 patch onto the skin daily. Remove & Discard patch within 12 hours or as directed by MD 30 patch 0   methocarbamol  (ROBAXIN ) 500 MG tablet Take 1 tablet (500 mg total) by mouth every 8 (eight) hours as needed for muscle spasms. 60 tablet 0   metoprolol  succinate (TOPROL -XL) 25 MG 24 hr tablet Take 1 tablet (25 mg total) by mouth daily. 90 tablet 2   propranolol  (INDERAL ) 10 MG tablet TAKE 1 TABLET BY MOUTH 4 TIMES DAILY AS NEEDED FOR PALPITATIONS, IRREGULAR OR FAST HEART RATE 270 tablet 1   Semaglutide , 1 MG/DOSE, (OZEMPIC , 1 MG/DOSE,) 4 MG/3ML SOPN INJECT 1 MG UNDER THE SKIN ONCE A WEEK AS DIRECTED 9 mL 3   senna-docusate (SENOKOT-S) 8.6-50 MG tablet Take 1-2 tablets by mouth 2 (two) times daily between meals as needed for mild constipation or moderate constipation.     TYLENOL  500 MG tablet Take 500-1,000 mg by mouth every 6 (six) hours as needed for mild pain (pain score 1-3) or headache.     oxyCODONE  (OXY  IR/ROXICODONE ) 5 MG immediate release tablet Take 1 tablet (5 mg total) by mouth every 6 (six) hours as needed for severe pain (pain score 7-10) (severe low back pain). 100 tablet 0   Facility-Administered Medications Prior to Visit  Medication Dose Route Frequency Provider Last Rate Last Admin   sodium chloride  flush (NS) 0.9 % injection 10 mL  10 mL Intracatheter PRN Lanny Callander, MD   10 mL at 02/15/24 1353    ROS: Review of Systems  Constitutional:  Positive for fatigue. Negative for appetite change and unexpected weight change.  HENT:  Negative for congestion, nosebleeds, sneezing, sore throat and trouble swallowing.   Eyes:  Negative for itching and visual disturbance.  Respiratory:  Negative for cough.  Cardiovascular:  Negative for chest pain, palpitations and leg swelling.  Gastrointestinal:  Negative for abdominal distention, blood in stool, diarrhea and nausea.  Genitourinary:  Negative for frequency and hematuria.  Musculoskeletal:  Positive for back pain and gait problem. Negative for joint swelling and neck pain.  Skin:  Negative for rash.  Neurological:  Negative for dizziness, tremors, speech difficulty and weakness.  Psychiatric/Behavioral:  Negative for agitation, dysphoric mood, sleep disturbance and suicidal ideas. The patient is not nervous/anxious.     Objective:  BP (!) 140/82   Pulse 92   Temp 98.6 F (37 C) (Oral)   Ht 6' 3 (1.905 m)   Wt 189 lb (85.7 kg)   SpO2 92%   BMI 23.62 kg/m   BP Readings from Last 3 Encounters:  05/23/24 (!) 140/82  05/15/24 118/77  05/09/24 120/60    Wt Readings from Last 3 Encounters:  05/23/24 189 lb (85.7 kg)  05/10/24 190 lb (86.2 kg)  05/09/24 179 lb 4.8 oz (81.3 kg)    Physical Exam Constitutional:      General: He is not in acute distress.    Appearance: He is well-developed. He is obese.     Comments: NAD  Eyes:     Conjunctiva/sclera: Conjunctivae normal.     Pupils: Pupils are equal, round, and reactive  to light.  Neck:     Thyroid : No thyromegaly.     Vascular: No JVD.  Cardiovascular:     Rate and Rhythm: Normal rate and regular rhythm.     Heart sounds: Normal heart sounds. No murmur heard.    No friction rub. No gallop.  Pulmonary:     Effort: Pulmonary effort is normal. No respiratory distress.     Breath sounds: Normal breath sounds. No wheezing or rales.  Chest:     Chest wall: No tenderness.  Abdominal:     General: Bowel sounds are normal. There is no distension.     Palpations: Abdomen is soft. There is no mass.     Tenderness: There is no abdominal tenderness. There is no guarding or rebound.  Musculoskeletal:        General: Tenderness present. Normal range of motion.     Cervical back: Normal range of motion.     Right lower leg: No edema.     Left lower leg: No edema.  Lymphadenopathy:     Cervical: No cervical adenopathy.  Skin:    General: Skin is warm and dry.     Findings: No rash.  Neurological:     Mental Status: He is alert and oriented to person, place, and time.     Cranial Nerves: No cranial nerve deficit.     Motor: No abnormal muscle tone.     Coordination: Coordination normal.     Gait: Gait normal.     Deep Tendon Reflexes: Reflexes are normal and symmetric.  Psychiatric:        Behavior: Behavior normal.        Thought Content: Thought content normal.        Judgment: Judgment normal.   Cystostomy L - clear urine PICC line R Using a walker   Lab Results  Component Value Date   WBC 6.9 05/15/2024   HGB 11.1 (L) 05/15/2024   HCT 33.1 (L) 05/15/2024   PLT 182 05/15/2024   GLUCOSE 135 (H) 05/15/2024   CHOL 116 02/27/2023   TRIG 150.0 (H) 02/27/2023   HDL 38.60 (L) 02/27/2023   LDLDIRECT 77.0  07/07/2015   LDLCALC 47 02/27/2023   ALT 12 05/11/2024   AST 14 (L) 05/11/2024   NA 133 (L) 05/15/2024   K 4.0 05/15/2024   CL 103 05/15/2024   CREATININE 1.09 05/15/2024   BUN 17 05/15/2024   CO2 21 (L) 05/15/2024   TSH 1.140 04/11/2024    PSA 6.51 (H) 02/27/2023   INR 1.0 05/11/2024   HGBA1C 7.1 (A) 02/13/2024   MICROALBUR 41.4 02/13/2024    MR Lumbar Spine W Wo Contrast Result Date: 05/10/2024 CLINICAL DATA:  Provided history: Low back pain, infection suspected, positive x-ray/CT. Concern for osteomyelitis/discitis (L4-L5). EXAM: MRI LUMBAR SPINE WITHOUT AND WITH CONTRAST TECHNIQUE: Multiplanar and multiecho pulse sequences of the lumbar spine were obtained without and with intravenous contrast. CONTRAST:  10mL GADAVIST  GADOBUTROL  1 MMOL/ML IV SOLN COMPARISON:  CT chest/abdomen/pelvis 05/07/2024. Lumbar spine MRI 03/27/2024. FINDINGS: Segmentation: 5 lumbar vertebrae. The caudal most well-formed tibial disc space is designated L5-S1. Alignment: Mild grade 1 retrolisthesis at L1-L2, L2-L3, L3-L4 and L4-L5. Mild grade 1 anterolisthesis at L5-S1. Vertebrae: New from the prior lumbar spine MRI 03/27/2024, there is prominent T1 hypointense and STIR hyperintense signal abnormality within the L4 and L5 vertebral bodies, centered along the L4-L5 disc space, with corresponding extensive pathologic enhancement. Signal abnormality and pathologic enhancement extends into the posterior elements on the left at L5. Additionally, there is new endplate irregularity and disc signal abnormality at L4-L5. This constellation of findings is consistent with sequela of discitis/osteomyelitis. To a lesser degree, there is new edema signal and enhancement within the S1 vertebral body and there is subtle abnormal enhancement along the ventral aspect of the L5-S1 disc. These findings likely reflect early involvement of the L5-S1 disc and S1 vertebral body. Also new from the prior MRI, there is ill-defined abnormal enhancement within the ventral epidural space at the L4-L5 disc level and L5 vertebral body level, likely reflecting epidural phlegmon (series 11, image 10). No well-defined epidural abscess is present at this time. There is no more than mild spinal canal  narrowing at the L5 vertebral body level. Conus medullaris and cauda equina: Conus extends to the L1-L2 level. Signal abnormality identified within the visualized distal spinal cord. No pathologic enhancement of the cauda equina nerve roots. Paraspinal and other soft tissues: Abnormal enhancing soft tissue along the ventral and lateral aspects of the L4-L5 vertebrae (right greater than left) consistent with paraspinal soft tissue infection/phlegmon. Additionally, there is right psoas edema and enhancement consistent with right psoas involvement and myositis. No appreciable right psoas abscess. Disc levels: A caudally migrated right subarticular disc extrusion at L3-L4 has somewhat regressed. This contributes to moderate to severe right subarticular stenosis, slightly improved from the prior exam. Lumbar spondylosis has otherwise not significantly changed from the recent prior lumbar spine MRI of 03/27/2024. IMPRESSION: 1. Findings consistent with discitis/osteomyelitis at the L4-L5 and L5-S1 levels, as detailed within the body of the report. Abnormal enhancement also present within the ventral epidural space at the L4-L5 disc level, and L5 vertebral body level, likely reflecting epidural phlegmon. No well-defined epidural abscess is present at this time. 2. Associated paraspinal soft tissue infection/phlegmon centered at L4-L5 with involvement of the right psoas muscle. No appreciable right psoas abscess. 3. A caudally migrated right subarticular disc extrusion at L3-L4 has somewhat regressed. This contributes to moderate-to-severe right subarticular stenosis, which has slightly improved. Lumbar spondylosis has otherwise not significantly changed from the recent prior lumbar spine MRI of 03/27/2024. Please refer to this prior examination for  further description. Electronically Signed   By: Rockey Childs D.O.   On: 05/10/2024 17:53    Assessment & Plan:   Problem List Items Addressed This Visit     Bladder  cancer [facility])   S/p Nephrectomy and bladder resection for cure - Jan 2025      Relevant Medications   ampicillin  (PRINCIPEN) 500 MG capsule   CKD (chronic kidney disease), stage III (HCC) - Primary   S/p Nephrectomy and bladder resection for bladder cancer cure - Jan 2025 Hydrate well      Diskitis   Acute L4-L5 discitis and Osteomyelitis with epidural phlegmon: -S/p IR disc aspiration 7/20. This was done off of antibiotics. -Blood cultures 7/18 showed NGTD -Aspirate culture with pansensitive Enterococcus faecalis. -Cleared for discharge by ID on IV ampicillin  until 06/10/2024. -Weekly labs as above. - Outpatient follow-up with ID   PICC line R Using a walker Getting a back brace from NS        Relevant Medications   traMADol  (ULTRAM ) 50 MG tablet   Hypomagnesemia   On OTC Mag Check CMET, CBC prior to Onc appt Diarrhea has stopped      Low back pain   Acute L4-L5 discitis and Osteomyelitis with epidural phlegmon: -S/p IR disc aspiration 7/20. This was done off of antibiotics. -Blood cultures 7/18 showed NGTD -Aspirate culture with pansensitive Enterococcus faecalis. -Cleared for discharge by ID on IV ampicillin  until 06/10/2024. -Weekly labs as above. - Outpatient follow-up with ID   Oxycodone  did not help - d/c NS gave Gabapentin Will renew Tramadol  - helping  Potential benefits of a long term opioids use as well as potential risks (i.e. addiction risk, apnea etc) and complications (i.e. Somnolence, constipation and others) were explained to the patient and were aknowledged.       Relevant Medications   traMADol  (ULTRAM ) 50 MG tablet      Meds ordered this encounter  Medications   traMADol  (ULTRAM ) 50 MG tablet    Sig: Take 1 tablet (50 mg total) by mouth every 8 (eight) hours as needed.    Dispense:  90 tablet    Refill:  1      Follow-up: Return in about 6 weeks (around 07/04/2024) for a follow-up visit.  Marolyn Noel, MD

## 2024-05-23 NOTE — Assessment & Plan Note (Signed)
 S/p Nephrectomy and bladder resection for cure - Jan 2025

## 2024-05-23 NOTE — Assessment & Plan Note (Addendum)
 Acute L4-L5 discitis and Osteomyelitis with epidural phlegmon: -S/p IR disc aspiration 7/20. This was done off of antibiotics. -Blood cultures 7/18 showed NGTD -Aspirate culture with pansensitive Enterococcus faecalis. -Cleared for discharge by ID on IV ampicillin  until 06/10/2024. -Weekly labs as above. - Outpatient follow-up with ID   PICC line R Using a walker Getting a back brace from NS

## 2024-05-23 NOTE — Assessment & Plan Note (Addendum)
 Acute L4-L5 discitis and Osteomyelitis with epidural phlegmon: -S/p IR disc aspiration 7/20. This was done off of antibiotics. -Blood cultures 7/18 showed NGTD -Aspirate culture with pansensitive Enterococcus faecalis. -Cleared for discharge by ID on IV ampicillin  until 06/10/2024. -Weekly labs as above. - Outpatient follow-up with ID   Oxycodone  did not help - d/c NS gave Gabapentin Will renew Tramadol  - helping  Potential benefits of a long term opioids use as well as potential risks (i.e. addiction risk, apnea etc) and complications (i.e. Somnolence, constipation and others) were explained to the patient and were aknowledged.

## 2024-05-23 NOTE — Assessment & Plan Note (Addendum)
 On OTC Mag Check CMET, CBC prior to Onc appt Diarrhea has stopped

## 2024-05-23 NOTE — Assessment & Plan Note (Addendum)
 S/p Nephrectomy and bladder resection for bladder cancer cure - Jan 2025 Hydrate well

## 2024-05-24 ENCOUNTER — Encounter: Payer: Self-pay | Admitting: *Deleted

## 2024-05-24 DIAGNOSIS — M4626 Osteomyelitis of vertebra, lumbar region: Secondary | ICD-10-CM | POA: Diagnosis not present

## 2024-05-24 DIAGNOSIS — Z792 Long term (current) use of antibiotics: Secondary | ICD-10-CM | POA: Diagnosis not present

## 2024-05-24 DIAGNOSIS — I4811 Longstanding persistent atrial fibrillation: Secondary | ICD-10-CM | POA: Diagnosis not present

## 2024-05-24 DIAGNOSIS — E1169 Type 2 diabetes mellitus with other specified complication: Secondary | ICD-10-CM | POA: Diagnosis not present

## 2024-05-24 DIAGNOSIS — N183 Chronic kidney disease, stage 3 unspecified: Secondary | ICD-10-CM | POA: Diagnosis not present

## 2024-05-24 DIAGNOSIS — Z452 Encounter for adjustment and management of vascular access device: Secondary | ICD-10-CM | POA: Diagnosis not present

## 2024-05-24 DIAGNOSIS — E1122 Type 2 diabetes mellitus with diabetic chronic kidney disease: Secondary | ICD-10-CM | POA: Diagnosis not present

## 2024-05-24 DIAGNOSIS — I129 Hypertensive chronic kidney disease with stage 1 through stage 4 chronic kidney disease, or unspecified chronic kidney disease: Secondary | ICD-10-CM | POA: Diagnosis not present

## 2024-05-24 DIAGNOSIS — E785 Hyperlipidemia, unspecified: Secondary | ICD-10-CM | POA: Diagnosis not present

## 2024-05-25 DIAGNOSIS — M4640 Discitis, unspecified, site unspecified: Secondary | ICD-10-CM | POA: Diagnosis not present

## 2024-05-27 ENCOUNTER — Telehealth: Payer: Self-pay | Admitting: *Deleted

## 2024-05-27 DIAGNOSIS — M4626 Osteomyelitis of vertebra, lumbar region: Secondary | ICD-10-CM | POA: Diagnosis not present

## 2024-05-27 DIAGNOSIS — E785 Hyperlipidemia, unspecified: Secondary | ICD-10-CM | POA: Diagnosis not present

## 2024-05-27 DIAGNOSIS — I129 Hypertensive chronic kidney disease with stage 1 through stage 4 chronic kidney disease, or unspecified chronic kidney disease: Secondary | ICD-10-CM | POA: Diagnosis not present

## 2024-05-27 DIAGNOSIS — I4811 Longstanding persistent atrial fibrillation: Secondary | ICD-10-CM | POA: Diagnosis not present

## 2024-05-27 DIAGNOSIS — I480 Paroxysmal atrial fibrillation: Secondary | ICD-10-CM

## 2024-05-27 DIAGNOSIS — E1169 Type 2 diabetes mellitus with other specified complication: Secondary | ICD-10-CM | POA: Diagnosis not present

## 2024-05-27 DIAGNOSIS — Z792 Long term (current) use of antibiotics: Secondary | ICD-10-CM | POA: Diagnosis not present

## 2024-05-27 DIAGNOSIS — N183 Chronic kidney disease, stage 3 unspecified: Secondary | ICD-10-CM | POA: Diagnosis not present

## 2024-05-27 DIAGNOSIS — E1122 Type 2 diabetes mellitus with diabetic chronic kidney disease: Secondary | ICD-10-CM | POA: Diagnosis not present

## 2024-05-27 DIAGNOSIS — Z452 Encounter for adjustment and management of vascular access device: Secondary | ICD-10-CM | POA: Diagnosis not present

## 2024-05-27 NOTE — Progress Notes (Signed)
 Complex Care Management Note Care Guide Note  05/27/2024 Name: Robert [name] MRN: [id number] DOB: 30-Apr-1951   Complex Care Management Outreach Attempts: An unsuccessful telephone outreach was attempted today to offer the patient information about available complex care management services.  Follow Up Plan:  Additional outreach attempts will be made to offer the patient complex care management information and services.   Encounter Outcome:  No Answer  Thedford Franks, CMA, Care Guid [facility]  [facility], [facility] Guide Direct Dial: [phone]  Fax: [phone] Website: [email or web address]

## 2024-05-27 NOTE — Progress Notes (Signed)
 Complex Care Management Note  Care Guide Note 05/27/2024 Name: Robert [name] MRN: [id number] DOB: 1951-06-12  Robert [name] is a 73 y.o. year old male who sees Plotnikov, Aleksei V, MD for primary care. I reached out to Robert [name] by phone today to offer complex care management services.  Robert [name] was given information about Complex Care Management services today including:   The Complex Care Management services include support from the care team which includes your Nurse Care Manager, Clinical Social Worker, or Pharmacist.  The Complex Care Management team is here to help remove barriers to the health concerns and goals most important to you. Complex Care Management services are voluntary, and the patient may decline or stop services at any time by request to their care team member.   Complex Care Management Consent Status: Patient agreed to services and verbal consent obtained.   Follow up plan:  Telephone appointment with complex care management team member scheduled for:  06/04/2024  Encounter Outcome:  Patient Scheduled  Thedford Franks, CMA [place]  [facility], [facility] Guide Direct Dial: [phone]  Fax: [phone] Website: [email or web address]

## 2024-05-28 DIAGNOSIS — M4640 Discitis, unspecified, site unspecified: Secondary | ICD-10-CM | POA: Diagnosis not present

## 2024-05-29 ENCOUNTER — Inpatient Hospital Stay (HOSPITAL_COMMUNITY)
Admission: EM | Admit: 2024-05-29 | Discharge: 2024-06-12 | DRG: 698 | Disposition: A | Discharge status: Skilled Nursing Facility | Attending: Internal Medicine | Admitting: Internal Medicine

## 2024-05-29 ENCOUNTER — Other Ambulatory Visit: Payer: Self-pay

## 2024-05-29 DIAGNOSIS — M5126 Other intervertebral disc displacement, lumbar region: Secondary | ICD-10-CM | POA: Diagnosis present

## 2024-05-29 DIAGNOSIS — R531 Weakness: Secondary | ICD-10-CM | POA: Diagnosis not present

## 2024-05-29 DIAGNOSIS — F17290 Nicotine dependence, other tobacco product, uncomplicated: Secondary | ICD-10-CM | POA: Diagnosis present

## 2024-05-29 DIAGNOSIS — R262 Difficulty in walking, not elsewhere classified: Secondary | ICD-10-CM | POA: Diagnosis not present

## 2024-05-29 DIAGNOSIS — M4627 Osteomyelitis of vertebra, lumbosacral region: Secondary | ICD-10-CM | POA: Diagnosis present

## 2024-05-29 DIAGNOSIS — E1122 Type 2 diabetes mellitus with diabetic chronic kidney disease: Principal | ICD-10-CM | POA: Diagnosis present

## 2024-05-29 DIAGNOSIS — M48061 Spinal stenosis, lumbar region without neurogenic claudication: Secondary | ICD-10-CM | POA: Diagnosis not present

## 2024-05-29 DIAGNOSIS — Z79899 Other long term (current) drug therapy: Secondary | ICD-10-CM | POA: Diagnosis not present

## 2024-05-29 DIAGNOSIS — M869 Osteomyelitis, unspecified: Secondary | ICD-10-CM | POA: Diagnosis not present

## 2024-05-29 DIAGNOSIS — Z8616 Personal history of COVID-19: Secondary | ICD-10-CM | POA: Diagnosis not present

## 2024-05-29 DIAGNOSIS — M4620 Osteomyelitis of vertebra, site unspecified: Secondary | ICD-10-CM | POA: Diagnosis present

## 2024-05-29 DIAGNOSIS — I700 Atherosclerosis of aorta: Secondary | ICD-10-CM | POA: Diagnosis present

## 2024-05-29 DIAGNOSIS — M549 Dorsalgia, unspecified: Secondary | ICD-10-CM | POA: Diagnosis present

## 2024-05-29 DIAGNOSIS — Z7401 Bed confinement status: Secondary | ICD-10-CM | POA: Diagnosis not present

## 2024-05-29 DIAGNOSIS — Z7901 Long term (current) use of anticoagulants: Secondary | ICD-10-CM

## 2024-05-29 DIAGNOSIS — E1169 Type 2 diabetes mellitus with other specified complication: Secondary | ICD-10-CM | POA: Diagnosis present

## 2024-05-29 DIAGNOSIS — R7881 Bacteremia: Secondary | ICD-10-CM | POA: Diagnosis present

## 2024-05-29 DIAGNOSIS — Z8551 Personal history of malignant neoplasm of bladder: Secondary | ICD-10-CM

## 2024-05-29 DIAGNOSIS — E785 Hyperlipidemia, unspecified: Secondary | ICD-10-CM | POA: Diagnosis present

## 2024-05-29 DIAGNOSIS — I129 Hypertensive chronic kidney disease with stage 1 through stage 4 chronic kidney disease, or unspecified chronic kidney disease: Secondary | ICD-10-CM | POA: Diagnosis present

## 2024-05-29 DIAGNOSIS — Z888 Allergy status to other drugs, medicaments and biological substances status: Secondary | ICD-10-CM

## 2024-05-29 DIAGNOSIS — M4856XA Collapsed vertebra, not elsewhere classified, lumbar region, initial encounter for fracture: Secondary | ICD-10-CM | POA: Diagnosis present

## 2024-05-29 DIAGNOSIS — J189 Pneumonia, unspecified organism: Secondary | ICD-10-CM | POA: Diagnosis present

## 2024-05-29 DIAGNOSIS — E119 Type 2 diabetes mellitus without complications: Secondary | ICD-10-CM

## 2024-05-29 DIAGNOSIS — R2681 Unsteadiness on feet: Secondary | ICD-10-CM | POA: Diagnosis not present

## 2024-05-29 DIAGNOSIS — G061 Intraspinal abscess and granuloma: Secondary | ICD-10-CM | POA: Diagnosis present

## 2024-05-29 DIAGNOSIS — M4626 Osteomyelitis of vertebra, lumbar region: Secondary | ICD-10-CM | POA: Diagnosis present

## 2024-05-29 DIAGNOSIS — B952 Enterococcus as the cause of diseases classified elsewhere: Secondary | ICD-10-CM | POA: Diagnosis present

## 2024-05-29 DIAGNOSIS — M898X9 Other specified disorders of bone, unspecified site: Secondary | ICD-10-CM | POA: Diagnosis not present

## 2024-05-29 DIAGNOSIS — R609 Edema, unspecified: Secondary | ICD-10-CM | POA: Diagnosis not present

## 2024-05-29 DIAGNOSIS — M1611 Unilateral primary osteoarthritis, right hip: Secondary | ICD-10-CM | POA: Diagnosis not present

## 2024-05-29 DIAGNOSIS — I480 Paroxysmal atrial fibrillation: Secondary | ICD-10-CM | POA: Diagnosis present

## 2024-05-29 DIAGNOSIS — F17210 Nicotine dependence, cigarettes, uncomplicated: Secondary | ICD-10-CM | POA: Diagnosis present

## 2024-05-29 DIAGNOSIS — Z9081 Acquired absence of spleen: Secondary | ICD-10-CM

## 2024-05-29 DIAGNOSIS — M5124 Other intervertebral disc displacement, thoracic region: Secondary | ICD-10-CM | POA: Diagnosis present

## 2024-05-29 DIAGNOSIS — C61 Malignant neoplasm of prostate: Secondary | ICD-10-CM | POA: Diagnosis present

## 2024-05-29 DIAGNOSIS — Z936 Other artificial openings of urinary tract status: Secondary | ICD-10-CM | POA: Diagnosis not present

## 2024-05-29 DIAGNOSIS — Z794 Long term (current) use of insulin: Secondary | ICD-10-CM | POA: Diagnosis not present

## 2024-05-29 DIAGNOSIS — M4646 Discitis, unspecified, lumbar region: Secondary | ICD-10-CM | POA: Diagnosis not present

## 2024-05-29 DIAGNOSIS — N183 Chronic kidney disease, stage 3 unspecified: Secondary | ICD-10-CM | POA: Diagnosis not present

## 2024-05-29 DIAGNOSIS — I10 Essential (primary) hypertension: Secondary | ICD-10-CM | POA: Diagnosis present

## 2024-05-29 DIAGNOSIS — F0390 Unspecified dementia without behavioral disturbance: Secondary | ICD-10-CM | POA: Diagnosis present

## 2024-05-29 DIAGNOSIS — D631 Anemia in chronic kidney disease: Secondary | ICD-10-CM | POA: Diagnosis present

## 2024-05-29 DIAGNOSIS — M4640 Discitis, unspecified, site unspecified: Secondary | ICD-10-CM | POA: Diagnosis not present

## 2024-05-29 DIAGNOSIS — D649 Anemia, unspecified: Secondary | ICD-10-CM | POA: Diagnosis present

## 2024-05-29 DIAGNOSIS — M6281 Muscle weakness (generalized): Secondary | ICD-10-CM | POA: Diagnosis not present

## 2024-05-29 DIAGNOSIS — M545 Low back pain, unspecified: Secondary | ICD-10-CM | POA: Diagnosis not present

## 2024-05-29 DIAGNOSIS — G8929 Other chronic pain: Principal | ICD-10-CM | POA: Diagnosis present

## 2024-05-29 DIAGNOSIS — Z85828 Personal history of other malignant neoplasm of skin: Secondary | ICD-10-CM

## 2024-05-29 DIAGNOSIS — Z9221 Personal history of antineoplastic chemotherapy: Secondary | ICD-10-CM

## 2024-05-29 DIAGNOSIS — N179 Acute kidney failure, unspecified: Secondary | ICD-10-CM | POA: Diagnosis present

## 2024-05-29 DIAGNOSIS — N182 Chronic kidney disease, stage 2 (mild): Secondary | ICD-10-CM | POA: Diagnosis present

## 2024-05-29 DIAGNOSIS — Z905 Acquired absence of kidney: Secondary | ICD-10-CM

## 2024-05-29 MED ORDER — HYDROMORPHONE HCL 1 MG/ML IJ SOLN
1.0000 mg | Freq: Once | INTRAMUSCULAR | Status: AC
  Administered 2024-05-29: 1 mg via INTRAVENOUS
  Filled 2024-05-29: qty 1

## 2024-05-29 MED ORDER — DIAZEPAM 5 MG/ML IJ SOLN
2.5000 mg | Freq: Once | INTRAMUSCULAR | Status: AC
  Administered 2024-05-29: 2.5 mg via INTRAVENOUS
  Filled 2024-05-29: qty 2

## 2024-05-29 MED ORDER — MORPHINE SULFATE (PF) 4 MG/ML IV SOLN
8.0000 mg | Freq: Once | INTRAVENOUS | Status: AC
  Administered 2024-05-29: 8 mg via INTRAMUSCULAR
  Filled 2024-05-29: qty 2

## 2024-05-29 MED ORDER — MORPHINE SULFATE (PF) 4 MG/ML IV SOLN
8.0000 mg | Freq: Once | INTRAVENOUS | Status: AC
  Administered 2024-05-29: 8 mg via SUBCUTANEOUS
  Filled 2024-05-29: qty 2

## 2024-05-29 MED ORDER — DIAZEPAM 5 MG PO TABS: 5.0000 mg | ORAL_TABLET | Freq: Once | ORAL | Status: DC

## 2024-05-29 MED ORDER — DIAZEPAM 5 MG PO TABS
5.0000 mg | ORAL_TABLET | Freq: Once | ORAL | Status: AC
  Administered 2024-05-29: 5 mg via ORAL
  Filled 2024-05-29: qty 1

## 2024-05-29 MED ORDER — HYDROMORPHONE HCL 1 MG/ML IJ SOLN: 1.0000 mg | Freq: Once | INTRAMUSCULAR | Status: DC

## 2024-05-29 MED ORDER — ACETAMINOPHEN 325 MG PO TABS
650.0000 mg | ORAL_TABLET | Freq: Once | ORAL | Status: AC
  Administered 2024-05-29: 650 mg via ORAL
  Filled 2024-05-29: qty 2

## 2024-05-29 MED ORDER — DIAZEPAM 5 MG PO TABS: 5.0000 mg | ORAL_TABLET | Freq: Four times a day (QID) | ORAL | 0 refills | Status: DC | PRN

## 2024-05-29 NOTE — ED Provider Notes (Addendum)
 [place] EMERGENCY DEPARTMENT AT [facility] Provider Note   CSN: [id number] Arrival date & time: 05/29/24  1956     Patient presents with: Back Pain   Robert [name] is a 73 y.o. male.   73 year old male presents with acute onset of back pain which began this morning.  Patient states that he was recent treated for discitis.  Will records confirm that.  Notes that for several weeks he has been sleeping in a chair and last night he slept on his sleep number bed for the very first time.  States that this morning woke up he had trouble getting out of bed and which exacerbated his pain.  Pain is described as being in his mid thoracic back similar to where he had pain before in the past.  Does radiate down to his legs.  No bowel dysfunction.  No fevers.  No new weakness in his legs.  Has used his Ultram  without relief       Prior to Admission medications   Medication Sig Start Date End Date Taking? Authorizing Provider  ampicillin  (PRINCIPEN) 500 MG capsule Take 500 mg by mouth 3 (three) times daily. 05/21/24   [provider]  ampicillin  IVPB Inject 12 g into the vein daily for 26 days. As a continuous infusion. Indication:  Discitis First Dose: Yes Last Day of Therapy:  06/10/24 Labs - Once weekly:  CBC/D and BMP, Labs - Once weekly: ESR and CRP Method of administration: Ambulatory Pump (Continuous Infusion) Method of administration may be changed at the discretion of home infusion pharmacist based upon assessment of the patient and/or caregiver's ability to self-administer the medication ordered. 05/15/24 06/10/24  Gonfa, Taye T, MD  apixaban  (ELIQUIS ) 5 MG TABS tablet Take 1 tablet (5 mg total) by mouth 2 (two) times daily. 12/21/23   Nahser, Aleene PARAS, MD  atorvastatin  (LIPITOR) 40 MG tablet Take 1 tablet (40 mg total) by mouth at bedtime. 02/07/24   Cheryle Page, MD  B Complex-C (B-COMPLEX WITH VITAMIN C) tablet Take 1 tablet by mouth 3 (three) times a week.     [provider]  Cholecalciferol  (VITAMIN D -3 PO) Take 1 tablet by mouth 3 (three) times a week.    [provider]  EPINEPHrine  0.3 mg/0.3 mL IJ SOAJ injection Inject 0.3 mg into the muscle as needed for anaphylaxis. 09/14/23   Victor Lynwood DASEN, PA-C  gabapentin  (NEURONTIN ) 300 MG capsule Take 300 mg by mouth 3 (three) times daily. 05/22/24   [provider]  glucose blood test strip 1 each by Other route daily. Use daily for glucose control - Dx 250.00 04/21/20   Theophilus Andrews, Tully GRADE, MD  lidocaine  (LIDODERM ) 5 % Place 1 patch onto the skin daily. Remove & Discard patch within 12 hours or as directed by MD 04/03/24   Arlon Carliss ORN, DO  methocarbamol  (ROBAXIN ) 500 MG tablet Take 1 tablet (500 mg total) by mouth every 8 (eight) hours as needed for muscle spasms. 04/03/24   Arlon Carliss ORN, DO  metoprolol  succinate (TOPROL -XL) 25 MG 24 hr tablet Take 1 tablet (25 mg total) by mouth daily. 09/19/23   Nahser, Aleene PARAS, MD  propranolol  (INDERAL ) 10 MG tablet TAKE 1 TABLET BY MOUTH 4 TIMES DAILY AS NEEDED FOR PALPITATIONS, IRREGULAR OR FAST HEART RATE 10/25/22   Nahser, Aleene PARAS, MD  Semaglutide , 1 MG/DOSE, (OZEMPIC , 1 MG/DOSE,) 4 MG/3ML SOPN INJECT 1 MG UNDER THE SKIN ONCE A WEEK AS DIRECTED 07/20/23  Trixie File, MD  senna-docusate (SENOKOT-S) 8.6-50 MG tablet Take 1-2 tablets by mouth 2 (two) times daily between meals as needed for mild constipation or moderate constipation. 05/15/24   Gonfa, Taye T, MD  traMADol  (ULTRAM ) 50 MG tablet Take 1 tablet (50 mg total) by mouth every 8 (eight) hours as needed. 05/23/24   Plotnikov, Aleksei V, MD  TYLENOL  500 MG tablet Take 500-1,000 mg by mouth every 6 (six) hours as needed for mild pain (pain score 1-3) or headache.    [provider]    Allergies: Lisinopril  and Lyrica  [pregabalin ]    Review of Systems  All other systems reviewed and are negative.   Updated Vital Signs BP 130/76 (BP Location: Left Arm)    Pulse 83   Temp 98.5 F (36.9 C) (Oral)   Resp 17   SpO2 100%   Physical Exam Vitals and nursing note reviewed.  Constitutional:      General: He is not in acute distress.    Appearance: Normal appearance. He is well-developed. He is not toxic-appearing.  HENT:     Head: Normocephalic and atraumatic.  Eyes:     General: Lids are normal.     Conjunctiva/sclera: Conjunctivae normal.     Pupils: Pupils are equal, round, and reactive to light.  Neck:     Thyroid : No thyroid  mass.     Trachea: No tracheal deviation.  Cardiovascular:     Rate and Rhythm: Normal rate and regular rhythm.     Heart sounds: Normal heart sounds. No murmur heard.    No gallop.  Pulmonary:     Effort: Pulmonary effort is normal. No respiratory distress.     Breath sounds: Normal breath sounds. No stridor. No decreased breath sounds, wheezing, rhonchi or rales.  Abdominal:     General: There is no distension.     Palpations: Abdomen is soft.     Tenderness: There is no abdominal tenderness. There is no rebound.  Musculoskeletal:        General: No tenderness. Normal range of motion.     Cervical back: Normal range of motion and neck supple.       Back:  Skin:    General: Skin is warm and dry.     Findings: No abrasion or rash.  Neurological:     General: No focal deficit present.     Mental Status: He is alert and oriented to person, place, and time. Mental status is at baseline.     GCS: GCS eye subscore is 4. GCS verbal subscore is 5. GCS motor subscore is 6.     Cranial Nerves: No cranial nerve deficit.     Sensory: No sensory deficit.     Motor: Motor function is intact.     Comments: Strength is 5 of 5 in lower extremities bilateral Normal dorsal and plantarflexion  Psychiatric:        Attention and Perception: Attention normal.        Speech: Speech normal.        Behavior: Behavior normal.     (all labs ordered are listed, but only abnormal results are displayed) Labs Reviewed - No  data to display  EKG: None  Radiology: No results found.   Procedures   Medications Ordered in the ED  morphine  (PF) 4 MG/ML injection 8 mg (has no administration in time range)  diazepam  (VALIUM ) tablet 5 mg (has no administration in time range)  Medical Decision Making Amount and/or Complexity of Data Reviewed Labs: ordered.  Risk OTC drugs. Prescription drug management.   Medicated for back pain x 2 here now feels better.  Neurological exam is stable.  Do not feel that he needs to have any imaging.  No red flags for spinal cord involvement.  Will discharge home on Valium   11:20 PM Discharge, patient's pain returned and became severe to the point where he has had trouble getting out of his bed.  Will remedicated for pain.  Patient will need repeat imaging of his spine which will be done in the morning.  Check basic labs at this time.care turned over to dr raford     Final diagnoses:  None    ED Discharge Orders     None          Dasie Faden, MD 05/29/24 2230    Dasie Faden, MD 05/29/24 2321

## 2024-05-29 NOTE — ED Triage Notes (Signed)
 Pt BIB [facility] from home.pt c/o back pain for the past 5 weeks. Pt has picc line and current blood infection being treated with Abx. Pt requesting to have increased dosage in pain meds. Currently on tramadol .  188/98 98HR 16RR

## 2024-05-29 NOTE — ED Provider Notes (Signed)
 Care assumed from Dr. Dasie, patient with back pain, currently being treated for discitis. He is being held for MRI in the morning, may need to be admitted for pain control.  He rested quietly through the night, but did require additional analgesics prior to MRI.  Case is signed out to Dr. Ginger.   Raford Lenis, MD 05/30/24 [phone]

## 2024-05-30 ENCOUNTER — Emergency Department (HOSPITAL_COMMUNITY)

## 2024-05-30 ENCOUNTER — Encounter (HOSPITAL_COMMUNITY): Payer: Self-pay | Admitting: Internal Medicine

## 2024-05-30 DIAGNOSIS — M4856XA Collapsed vertebra, not elsewhere classified, lumbar region, initial encounter for fracture: Secondary | ICD-10-CM | POA: Diagnosis present

## 2024-05-30 DIAGNOSIS — B952 Enterococcus as the cause of diseases classified elsewhere: Secondary | ICD-10-CM | POA: Diagnosis present

## 2024-05-30 DIAGNOSIS — M4646 Discitis, unspecified, lumbar region: Secondary | ICD-10-CM | POA: Diagnosis not present

## 2024-05-30 DIAGNOSIS — J189 Pneumonia, unspecified organism: Secondary | ICD-10-CM | POA: Diagnosis present

## 2024-05-30 DIAGNOSIS — Z8551 Personal history of malignant neoplasm of bladder: Secondary | ICD-10-CM | POA: Diagnosis not present

## 2024-05-30 DIAGNOSIS — M549 Dorsalgia, unspecified: Secondary | ICD-10-CM | POA: Diagnosis present

## 2024-05-30 DIAGNOSIS — F17210 Nicotine dependence, cigarettes, uncomplicated: Secondary | ICD-10-CM | POA: Diagnosis present

## 2024-05-30 DIAGNOSIS — Z794 Long term (current) use of insulin: Secondary | ICD-10-CM | POA: Diagnosis not present

## 2024-05-30 DIAGNOSIS — I700 Atherosclerosis of aorta: Secondary | ICD-10-CM | POA: Diagnosis present

## 2024-05-30 DIAGNOSIS — E1122 Type 2 diabetes mellitus with diabetic chronic kidney disease: Secondary | ICD-10-CM | POA: Diagnosis present

## 2024-05-30 DIAGNOSIS — F17290 Nicotine dependence, other tobacco product, uncomplicated: Secondary | ICD-10-CM | POA: Diagnosis present

## 2024-05-30 DIAGNOSIS — Z79899 Other long term (current) drug therapy: Secondary | ICD-10-CM | POA: Diagnosis not present

## 2024-05-30 DIAGNOSIS — R531 Weakness: Secondary | ICD-10-CM | POA: Diagnosis not present

## 2024-05-30 DIAGNOSIS — M48061 Spinal stenosis, lumbar region without neurogenic claudication: Secondary | ICD-10-CM | POA: Diagnosis not present

## 2024-05-30 DIAGNOSIS — R7881 Bacteremia: Secondary | ICD-10-CM | POA: Diagnosis present

## 2024-05-30 DIAGNOSIS — Z85828 Personal history of other malignant neoplasm of skin: Secondary | ICD-10-CM | POA: Diagnosis not present

## 2024-05-30 DIAGNOSIS — G061 Intraspinal abscess and granuloma: Secondary | ICD-10-CM | POA: Diagnosis present

## 2024-05-30 DIAGNOSIS — N179 Acute kidney failure, unspecified: Secondary | ICD-10-CM | POA: Diagnosis present

## 2024-05-30 DIAGNOSIS — M898X9 Other specified disorders of bone, unspecified site: Secondary | ICD-10-CM | POA: Diagnosis not present

## 2024-05-30 DIAGNOSIS — M4627 Osteomyelitis of vertebra, lumbosacral region: Secondary | ICD-10-CM | POA: Diagnosis present

## 2024-05-30 DIAGNOSIS — D631 Anemia in chronic kidney disease: Secondary | ICD-10-CM | POA: Diagnosis present

## 2024-05-30 DIAGNOSIS — M4620 Osteomyelitis of vertebra, site unspecified: Secondary | ICD-10-CM | POA: Diagnosis present

## 2024-05-30 DIAGNOSIS — F0390 Unspecified dementia without behavioral disturbance: Secondary | ICD-10-CM | POA: Diagnosis present

## 2024-05-30 DIAGNOSIS — M4626 Osteomyelitis of vertebra, lumbar region: Secondary | ICD-10-CM

## 2024-05-30 DIAGNOSIS — R609 Edema, unspecified: Secondary | ICD-10-CM | POA: Diagnosis not present

## 2024-05-30 DIAGNOSIS — Z936 Other artificial openings of urinary tract status: Secondary | ICD-10-CM | POA: Diagnosis not present

## 2024-05-30 DIAGNOSIS — R262 Difficulty in walking, not elsewhere classified: Secondary | ICD-10-CM | POA: Diagnosis not present

## 2024-05-30 DIAGNOSIS — C61 Malignant neoplasm of prostate: Secondary | ICD-10-CM | POA: Diagnosis present

## 2024-05-30 DIAGNOSIS — E785 Hyperlipidemia, unspecified: Secondary | ICD-10-CM | POA: Diagnosis present

## 2024-05-30 DIAGNOSIS — I129 Hypertensive chronic kidney disease with stage 1 through stage 4 chronic kidney disease, or unspecified chronic kidney disease: Secondary | ICD-10-CM | POA: Diagnosis present

## 2024-05-30 DIAGNOSIS — D649 Anemia, unspecified: Secondary | ICD-10-CM | POA: Diagnosis present

## 2024-05-30 DIAGNOSIS — Z8616 Personal history of COVID-19: Secondary | ICD-10-CM | POA: Diagnosis not present

## 2024-05-30 DIAGNOSIS — M4640 Discitis, unspecified, site unspecified: Secondary | ICD-10-CM | POA: Diagnosis not present

## 2024-05-30 DIAGNOSIS — E1169 Type 2 diabetes mellitus with other specified complication: Secondary | ICD-10-CM | POA: Diagnosis present

## 2024-05-30 DIAGNOSIS — Z7901 Long term (current) use of anticoagulants: Secondary | ICD-10-CM | POA: Diagnosis not present

## 2024-05-30 DIAGNOSIS — I480 Paroxysmal atrial fibrillation: Secondary | ICD-10-CM | POA: Diagnosis present

## 2024-05-30 LAB — CBC WITH DIFFERENTIAL/PLATELET
Abs Immature Granulocytes: 0.08 K/uL — ABNORMAL HIGH (ref 0.00–0.07)
Basophils Absolute: 0 K/uL (ref 0.0–0.1)
Basophils Relative: 1 %
Eosinophils Absolute: 0 K/uL (ref 0.0–0.5)
Eosinophils Relative: 1 %
HCT: 31.5 % — ABNORMAL LOW (ref 39.0–52.0)
Hemoglobin: 10.2 g/dL — ABNORMAL LOW (ref 13.0–17.0)
Immature Granulocytes: 1 %
Lymphocytes Relative: 22 %
Lymphs Abs: 1.8 K/uL (ref 0.7–4.0)
MCH: 26.9 pg (ref 26.0–34.0)
MCHC: 32.4 g/dL (ref 30.0–36.0)
MCV: 83.1 fL (ref 80.0–100.0)
Monocytes Absolute: 1 K/uL (ref 0.1–1.0)
Monocytes Relative: 13 %
Neutro Abs: 5 K/uL (ref 1.7–7.7)
Neutrophils Relative %: 62 %
Platelets: 171 K/uL (ref 150–400)
RBC: 3.79 MIL/uL — ABNORMAL LOW (ref 4.22–5.81)
RDW: 15.6 % — ABNORMAL HIGH (ref 11.5–15.5)
WBC: 8 K/uL (ref 4.0–10.5)
nRBC: 0 % (ref 0.0–0.2)

## 2024-05-30 LAB — BASIC METABOLIC PANEL WITH GFR
Anion gap: 10 (ref 5–15)
BUN: 12 mg/dL (ref 8–23)
CO2: 23 mmol/L (ref 22–32)
Calcium: 8.6 mg/dL — ABNORMAL LOW (ref 8.9–10.3)
Chloride: 103 mmol/L (ref 98–111)
Creatinine, Ser: 0.93 mg/dL (ref 0.61–1.24)
GFR, Estimated: >60 mL/min (ref >60)
Glucose, Bld: 135 mg/dL — ABNORMAL HIGH (ref 70–99)
Potassium: 3.6 mmol/L (ref 3.5–5.1)
Sodium: 136 mmol/L (ref 135–145)

## 2024-05-30 LAB — GLUCOSE, CAPILLARY: Glucose-Capillary: 154 mg/dL — ABNORMAL HIGH (ref 70–99)

## 2024-05-30 MED ORDER — GABAPENTIN 300 MG PO CAPS
300.0000 mg | ORAL_CAPSULE | Freq: Three times a day (TID) | ORAL | Status: DC
  Administered 2024-05-30 – 2024-06-12 (×48): 300 mg via ORAL
  Filled 2024-05-30 (×39): qty 1

## 2024-05-30 MED ORDER — ONDANSETRON HCL 4 MG PO TABS: 4.0000 mg | ORAL_TABLET | Freq: Four times a day (QID) | ORAL | Status: DC | PRN

## 2024-05-30 MED ORDER — CHLORHEXIDINE GLUCONATE CLOTH 2 % EX PADS
6.0000 | MEDICATED_PAD | Freq: Every day | CUTANEOUS | Status: DC
  Administered 2024-05-30 – 2024-06-11 (×16): 6 via TOPICAL

## 2024-05-30 MED ORDER — ONDANSETRON HCL 4 MG/2ML IJ SOLN: 4.0000 mg | Freq: Four times a day (QID) | INTRAMUSCULAR | Status: DC | PRN

## 2024-05-30 MED ORDER — MORPHINE SULFATE (PF) 4 MG/ML IV SOLN
4.0000 mg | INTRAVENOUS | Status: DC | PRN
  Administered 2024-05-30 – 2024-06-03 (×17): 4 mg via INTRAVENOUS
  Filled 2024-05-30 (×16): qty 1

## 2024-05-30 MED ORDER — SODIUM CHLORIDE 0.9 % IV SOLN
2.0000 g | INTRAVENOUS | Status: DC
  Administered 2024-05-30 – 2024-06-12 (×95): 2 g via INTRAVENOUS
  Filled 2024-05-30 (×80): qty 2000

## 2024-05-30 MED ORDER — INSULIN ASPART 100 UNIT/ML IJ SOLN
0.0000 [IU] | Freq: Three times a day (TID) | INTRAMUSCULAR | Status: DC
  Administered 2024-05-30: 2 [IU] via SUBCUTANEOUS
  Administered 2024-05-31 – 2024-06-01 (×2): 3 [IU] via SUBCUTANEOUS
  Administered 2024-06-01: 5 [IU] via SUBCUTANEOUS
  Administered 2024-06-02: 2 [IU] via SUBCUTANEOUS
  Administered 2024-06-02: 3 [IU] via SUBCUTANEOUS
  Administered 2024-06-02 – 2024-06-03 (×2): 2 [IU] via SUBCUTANEOUS
  Administered 2024-06-03: 3 [IU] via SUBCUTANEOUS
  Administered 2024-06-03: 2 [IU] via SUBCUTANEOUS
  Administered 2024-06-03 – 2024-06-04 (×4): 3 [IU] via SUBCUTANEOUS
  Administered 2024-06-04 (×2): 2 [IU] via SUBCUTANEOUS
  Administered 2024-06-04 – 2024-06-05 (×4): 3 [IU] via SUBCUTANEOUS
  Administered 2024-06-05: 5 [IU] via SUBCUTANEOUS
  Administered 2024-06-05: 3 [IU] via SUBCUTANEOUS
  Administered 2024-06-05: 5 [IU] via SUBCUTANEOUS
  Administered 2024-06-05 – 2024-06-06 (×3): 3 [IU] via SUBCUTANEOUS
  Administered 2024-06-06: 5 [IU] via SUBCUTANEOUS
  Administered 2024-06-06 – 2024-06-07 (×2): 2 [IU] via SUBCUTANEOUS
  Administered 2024-06-07 (×2): 3 [IU] via SUBCUTANEOUS
  Administered 2024-06-08: 2 [IU] via SUBCUTANEOUS
  Administered 2024-06-08: 8 [IU] via SUBCUTANEOUS
  Administered 2024-06-08: 3 [IU] via SUBCUTANEOUS
  Administered 2024-06-09: 2 [IU] via SUBCUTANEOUS
  Administered 2024-06-09: 3 [IU] via SUBCUTANEOUS
  Administered 2024-06-09 – 2024-06-10 (×2): 5 [IU] via SUBCUTANEOUS
  Administered 2024-06-10: 3 [IU] via SUBCUTANEOUS
  Administered 2024-06-10: 5 [IU] via SUBCUTANEOUS
  Administered 2024-06-11: 2 [IU] via SUBCUTANEOUS
  Administered 2024-06-11: 3 [IU] via SUBCUTANEOUS
  Administered 2024-06-11 – 2024-06-12 (×2): 2 [IU] via SUBCUTANEOUS

## 2024-05-30 MED ORDER — MORPHINE SULFATE 15 MG PO TABS: 15.0000 mg | ORAL_TABLET | ORAL | Status: DC | PRN

## 2024-05-30 MED ORDER — SODIUM CHLORIDE 0.9% FLUSH
10.0000 mL | Freq: Two times a day (BID) | INTRAVENOUS | Status: DC
  Administered 2024-05-30: 40 mL
  Administered 2024-05-30 – 2024-06-10 (×25): 10 mL
  Administered 2024-06-10: 30 mL
  Administered 2024-06-11 (×2): 10 mL

## 2024-05-30 MED ORDER — SENNOSIDES-DOCUSATE SODIUM 8.6-50 MG PO TABS
1.0000 | ORAL_TABLET | Freq: Two times a day (BID) | ORAL | Status: DC | PRN
  Administered 2024-05-30 – 2024-06-07 (×11): 2 via ORAL
  Filled 2024-05-30 (×8): qty 2

## 2024-05-30 MED ORDER — ACETAMINOPHEN 325 MG PO TABS: 650.0000 mg | ORAL_TABLET | Freq: Four times a day (QID) | ORAL | Status: DC | PRN

## 2024-05-30 MED ORDER — LIDOCAINE 5 % EX PTCH
1.0000 | MEDICATED_PATCH | Freq: Every day | CUTANEOUS | Status: DC | PRN
  Administered 2024-06-01 – 2024-06-02 (×2): 1 via TRANSDERMAL
  Filled 2024-05-30 (×2): qty 1

## 2024-05-30 MED ORDER — ACETAMINOPHEN 650 MG RE SUPP: 650.0000 mg | Freq: Four times a day (QID) | RECTAL | Status: DC | PRN

## 2024-05-30 MED ORDER — AMPICILLIN IV (FOR PTA / DISCHARGE USE ONLY): 12.0000 g | INTRAVENOUS | Status: DC

## 2024-05-30 MED ORDER — ATORVASTATIN CALCIUM 40 MG PO TABS
40.0000 mg | ORAL_TABLET | Freq: Every day | ORAL | Status: DC
  Administered 2024-05-30 – 2024-06-11 (×16): 40 mg via ORAL
  Filled 2024-05-30 (×14): qty 1

## 2024-05-30 MED ORDER — OXYCODONE HCL 5 MG PO TABS: 5.0000 mg | ORAL_TABLET | ORAL | Status: DC | PRN

## 2024-05-30 MED ORDER — APIXABAN 5 MG PO TABS
5.0000 mg | ORAL_TABLET | Freq: Two times a day (BID) | ORAL | Status: DC
Start: 2024-05-30 — End: 2024-06-12
  Administered 2024-05-30 – 2024-06-12 (×33): 5 mg via ORAL
  Filled 2024-05-30 (×27): qty 1

## 2024-05-30 MED ORDER — SODIUM CHLORIDE 0.9% FLUSH: 10.0000 mL | INTRAVENOUS | Status: DC | PRN

## 2024-05-30 MED ORDER — METOPROLOL SUCCINATE ER 25 MG PO TB24
25.0000 mg | ORAL_TABLET | Freq: Every day | ORAL | Status: DC
  Administered 2024-05-30 – 2024-06-11 (×16): 25 mg via ORAL
  Filled 2024-05-30 (×13): qty 1

## 2024-05-30 MED ORDER — GADOBUTROL 1 MMOL/ML IV SOLN
8.0000 mL | Freq: Once | INTRAVENOUS | Status: AC | PRN
  Administered 2024-05-30: 8 mL via INTRAVENOUS

## 2024-05-30 MED ORDER — MORPHINE SULFATE 15 MG PO TABS
15.0000 mg | ORAL_TABLET | ORAL | Status: DC | PRN
  Administered 2024-05-30 – 2024-06-03 (×8): 15 mg via ORAL
  Filled 2024-05-30 (×6): qty 1

## 2024-05-30 MED ORDER — ONDANSETRON HCL 4 MG/2ML IJ SOLN
4.0000 mg | Freq: Once | INTRAMUSCULAR | Status: AC
  Administered 2024-05-30: 4 mg via INTRAVENOUS
  Filled 2024-05-30: qty 2

## 2024-05-30 MED ORDER — HYDROMORPHONE HCL 1 MG/ML IJ SOLN
1.0000 mg | Freq: Once | INTRAMUSCULAR | Status: AC
  Administered 2024-05-30: 1 mg via INTRAVENOUS
  Filled 2024-05-30: qty 1

## 2024-05-30 NOTE — ED Notes (Signed)
 MRI arrived to transport patient to MRI

## 2024-05-30 NOTE — Treatment Plan (Signed)
 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    OPAT Orders Discharge antibiotics: Ampicillin  IV  to be given as a continuous infusion of 12 g every 24 hours Duration: Total of 10 weeks End Date: 07/08/24  [facility] Care Per Protocol:  Labs weekly while on IV antibiotics: _X_ CBC with differential  _X_ CMP _X_ CRP _X_ ESR   _X_ Please leave PIC in place until doctor has seen patient or been notified  Fax weekly lab results  promptly to [phone]  Clinic Follow Up Appt: 06/10/24 at 2pm with Dr.Mayanka singh   Call [phone] with any questions or critical values

## 2024-05-30 NOTE — ED Provider Notes (Signed)
 Care assumed from Dr. Raford.  Patient awaiting MRI results and reassessment to determine if patient is able to be discharged home if it is reassuring or any to be remitted for either concerning findings or pain control.  8:12 AM MRI just returned showing evidence of worsening infection and possible early developing epidural abscess.  Will inform patient and consult neurosurgery and anticipate medical admission.  8:38 AM Spoke to Dr. Darnella with neurosurgery who agreed with medicine admission for pain control and to determine further plan but he looked at the images and does not think this is a acute surgical problem at this time without neurologic deficit.   9:04 AM Medicine was called he will admit for further pain control and anticipate they may consult infectious disease to discuss antibiotic management.   Clinical Impression: 1. Chronic back pain, unspecified back location, unspecified back pain laterality   2. Discitis, unspecified spinal region     Disposition: Admit  This note was prepared with assistance of Dragon voice recognition software. Occasional wrong-word or sound-a-like substitutions may have occurred due to the inherent limitations of voice recognition software.        Loyal Holzheimer, Lonni PARAS, MD 05/30/24 [phone]

## 2024-05-30 NOTE — Consult Note (Addendum)
 NAME: Robert [name]  DOB: [birth year]  MRN: [id number]  Date/Time: 05/30/2024 2:49 PM  REQUESTING PROVIDER: Dr. Celinda Subjective:  REASON FOR CONSULT:  vertebrae discitis and osteomyelitis with epidural abscess ? Robert [name] is a 73 y.o. male with a history of bladder carcinoma status post robotic right nephrectomy, right ureterectomy, radical cystectomy and excision of prostatic urethra and pelvic lymph node dissection on 12/08/2023, Serratia bacteremia and urinary tract infection in April 2025 and treated with appropriate antibiotics for 2 weeks, receiving nivolumab  every 4 weeks since April 2025, readmission to the hospital in May for AKI, stent placement in the left ureter, Enterococcus faecalis bacteremia, port removal and appropriate antibiotic treatment for 2 weeks with a negative TEE, readmission to the hospital 03/27/2024 until 04/03/2024 for Enterococcus bacteremia and treated again with 2 weeks of antibiotic with negative surveillance culture, worsening back pain in July and readmission 7/18 to 05/15/2024 for back pain and MRI showed L4-L5 discitis and osteomyelitis with L5-S1 discitis and epidural phlegmon and he underwent a disc aspiration on 05/12/2024 which was E faecalis had a PICC placement and started on ampicillin  the plan to give it for at least 6 weeks until 06/10/2024 He presents today with worsening back pain Patient has been walking with a walker at home but for the past 2 days the pain is worse and he says it is like a 12 He does not have any fever or chills Current pain medication with tramadol  and gabapentin  is not helping.  He saw the pain management with the spine center and is supposed to go for a brace fitting tomorrow In the ED vitals  05/29/24  BP 135/73  Temp 98.5 F (36.9 C)  Pulse Rate 82  Resp 16  SpO2 100 %    Latest Reference Range & Units 05/29/24  WBC 4.0 - 10.5 K/uL 8.0  Hemoglobin 13.0 - 17.0 g/dL 89.7 (L)  HCT 60.9 - 47.9 % 31.5 (L)   Platelets 150 - 400 K/uL 171  Creatinine 0.61 - 1.24 mg/dL 9.06  In the ED MRI was repeated of the lumbar spine and the thoracic spine and it showed Progressive collapse of the left L5 vertebral body.  The height now measured 17 mm compared to 24 mm previously.  There was diffuse edema and enhancement in the L4 and L5 vertebral bodies similar to prior image.  There was progressive marrow signal change and enhancement within the S1 vertebral body.  There was extensive paraspinal soft tissue enhancement and at L4-L5 and L5-S1 epidural enhancement was again noted. I am asked to see the patient for the management of the same problem  Past Medical History:  Diagnosis Date   Anticoagulant long-term use    eliquis --- managed by cardiology   Aortic atherosclerosis (HCC) 05/30/2024   Arthritis    Bladder cancer [facility])    urologist--- dr nieves;  overlapping   Cataract    History of basal cell carcinoma (BCC) excision    History of COVID-19 05/2019   per pt mild symptoms that resolved   HLD (hyperlipidemia)    HTN (hypertension)    Hx of colonic polyps    Insulin  dependent type 2 diabetes mellitus [facility])    endocrinologist--- dr trixie --  (03-11-2022 per pt check blood sugar 1-2 times daily,  fasting sugar-- 118-120s)   Nocturia    Paroxysmal atrial fibrillation (HCC) 03/12/2019   cardiologist--- dr calhoun   Renal disorder    Ureterocele    Use of cane  as ambulatory aid     Past Surgical History:  Procedure Laterality Date   BLEPHAROPLASTY Bilateral    per pt approx  2001;   upper eyelid's   COLONOSCOPY     last one approx 2023   CYSTOSCOPY W/ RETROGRADES N/A 12/08/2023   Procedure: CYSTOSCOPY;  Surgeon: Alvaro Ricardo KATHEE Mickey., MD;  [facility]: [facility];  Service: Urology;  Laterality: N/A;  390 MINUTES NEEDED FOR CASE   CYSTOSCOPY WITH URETEROSCOPY AND STENT PLACEMENT Right 12/02/2022   Procedure: CYSTOSCOPY WITH RIGHT URETEROSCOPY, RIGHT DIAGNOSTIC URETEROSCOPY;  Surgeon: Nieves Cough, MD;  [facility];  Service: Urology;  Laterality: Right;  90 MINS FOR CASE   CYSTOSCOPY/RETROGRADE/URETEROSCOPY Bilateral 05/05/2023   Procedure: CYSTOSCOPY BILATERAL RETROGRADE PYELOGRAM RIGHT URETEROSCOPY RIGHT STENT PLACEMENT;  Surgeon: Nieves Cough, MD;  Location: [facility];  Service: Urology;  Laterality: Bilateral;  90 MINS FOR CASE   IR IMAGING GUIDED PORT INSERTION  05/30/2023   IR LUMBAR DISC ASPIRATION W/IMG GUIDE  05/12/2024   IR REMOVAL TUN ACCESS W/ PORT W/O FL MOD SED  03/29/2024   PATELLAR TENDON REPAIR Left 04/16/2004   @[facility]    ROBOT ASSITED LAPAROSCOPIC NEPHROURETERECTOMY Right 12/08/2023   Procedure: RIGHT ROBOT ASSISTED Nephroureterectomy;  Surgeon: Alvaro Ricardo KATHEE Mickey., MD;  [facility];  Service: Urology;  Laterality: Right;   ROBOT LAP RADICAL CYSTOPROSTATECTOMY PELVIC LYMPHADENECTOMY, NEOBLADDER  12/08/2023   Procedure: ROBOT ASSISTED LAPAROSCOPIC RADICAL CYSTOPROSTATECTOMY;  Surgeon: Alvaro Ricardo KATHEE Mickey., MD;  [facility];  Service: Urology;;   TOTAL URETHRECTOMY,RADICAL N/A 12/08/2023   Procedure: TOTAL URETHRECTOMY,RADICAL;  Surgeon: Alvaro Ricardo KATHEE Mickey., MD;  [facility];  Service: Urology;  Laterality: N/A;   TRANSESOPHAGEAL ECHOCARDIOGRAM (CATH LAB) N/A 04/02/2024   Procedure: TRANSESOPHAGEAL ECHOCARDIOGRAM;  Surgeon: Delford Maude BROCKS, MD;  Location: [facility] INVASIVE CV LAB;  Service: Cardiovascular;  Laterality: N/A;   TRANSURETHRAL RESECTION OF BLADDER TUMOR N/A 06/18/2021   Procedure: TRANSURETHRAL RESECTION OF BLADDER TUMOR (TURBT) WITH RIGHT URETERAL STENT PLACEMENT, RIGHT URETEROSCOPY WITH DISTRACTION OF TUMOR, FULGURATION;  Surgeon: Nieves Cough, MD;  Location: [facility];  Service: Urology;  Laterality: N/A;   TRANSURETHRAL RESECTION OF BLADDER TUMOR N/A 03/15/2022   Procedure: TRANSURETHRAL RESECTION OF BLADDER TUMOR (TURBT) WITH BILATERAL URETEROSCOPY/URETHRAL/ DILATION/BILATERAL RETROGRADE  PYELOGRAM/BIOPSY AND FULGURATION OF RIGHT URETERAL CANCER/RIGHT STENT PLACEMENT;  Surgeon: Nieves Cough, MD;  Location: [facility];  Service: Urology;  Laterality: N/A;   TRANSURETHRAL RESECTION OF BLADDER TUMOR N/A 05/05/2023   Procedure: TRANSURETHRAL RESECTION OF BLADDER TUMOR (TURBT) BLADDER BIOSPY;  Surgeon: Nieves Cough, MD;  Location: [facility];  Service: Urology;  Laterality: N/A;   TRANSURETHRAL RESECTION OF BLADDER TUMOR WITH MITOMYCIN -C N/A 12/02/2022   Procedure: TRANSURETHRAL RESECTION OF BLADDER TUMOR;  Surgeon: Nieves Cough, MD;  [facility];  Service: Urology;  Laterality: N/A;   TRANSURETHRAL RESECTION OF PROSTATE  05/05/2023   Procedure: TRANSURETHRAL RESECTION OF THE PROSTATE (TURP);  Surgeon: Nieves Cough, MD;  Location: [facility];  Service: Urology;;    Social History   Socioeconomic History   Marital status: Married    Spouse name: Not on file   Number of children: 1   Years of education: Not on file   Highest education level: Some college, no degree  Occupational History   Occupation: DVD's   Occupation: Occupational hygienist: Self Employed  Tobacco Use   Smoking status: Some Days    Current packs/day: 0.25  Average packs/day: 0.3 packs/day for 13.6 years (3.6 ttl pk-yrs)    Types: Cigars, Cigarettes    Start date: 2012    Last attempt to quit: 02/22/2011   Smokeless tobacco: Never   Tobacco comments:    Smoking cessation instruction/counseling given:  counseled patient on the dangers of tobacco use, advised patient to stop smoking, and reviewed strategies to maximize success   Vaping Use   Vaping status: Never Used  Substance and Sexual Activity   Alcohol  use: Yes    Alcohol /week: 3.0 standard drinks of alcohol     Types: 3 Shots of liquor per week    Comment: 6-8oz /wk OCC MARTINI 2 TO 3 X WEEK   Drug use: Never   Sexual activity: Not Currently  Other Topics Concern   Not on  file  Social History Narrative   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   Current Smoker   Social Drivers of Health   Financial Resource Strain: Low Risk  (04/08/2024)   Overall Financial Resource Strain (CARDIA)    Difficulty of Paying Living Expenses: Not hard at all  Food Insecurity: No Food Insecurity (05/30/2024)   Hunger Vital Sign    Worried About Running Out of Food in the Last Year: Never true    Ran Out of Food in the Last Year: Never true  Transportation Needs: No Transportation Needs (05/30/2024)   PRAPARE - Administrator, Civil Service (Medical): No    Lack of Transportation (Non-Medical): No  Physical Activity: Inactive (04/08/2024)   Exercise Vital Sign    Days of Exercise per Week: 0 days    Minutes of Exercise per Session: 0 min  Stress: Stress Concern Present (04/08/2024)   [employer] of Occupational Health - Occupational Stress Questionnaire    Feeling of Stress: To some extent  Social Connections: Unknown (05/30/2024)   Social Connection and Isolation Panel    Frequency of Communication with Friends and Family: Twice a week    Frequency of Social Gatherings with Friends and Family: Once a week    Attends Religious Services: Not on Marketing executive or Organizations: Not on file    Attends Banker Meetings: Not on file    Marital Status: Not on file  Recent Concern: Social Connections - Moderately Isolated (03/28/2024)   Social Connection and Isolation Panel    Frequency of Communication with Friends and Family: More than three times a week    Frequency of Social Gatherings with Friends and Family: Twice a week    Attends Religious Services: Never    Database administrator or Organizations: No    Attends Banker Meetings: Never    Marital Status: Married  Catering manager Violence: Unknown (05/30/2024)   Humiliation, Afraid, Rape, and Kick questionnaire    Fear of Current or Ex-Partner:  No    Emotionally Abused: No    Physically Abused: Not on file    Sexually Abused: No    Family History  Problem Relation Age of Onset   Pancreatic cancer Mother 65   Cancer Paternal Uncle        unknown type cancer   Aortic aneurysm Other        family history   Cancer Other        Breast (1st degree relative)   Colon cancer Neg Hx    Rectal cancer Neg Hx    Stomach cancer Neg Hx  Esophageal cancer Neg Hx    Allergies  Allergen Reactions   Lisinopril  Swelling and Other (See Comments)    Tongue and throat became swollen   Lyrica  [Pregabalin ] Swelling and Other (See Comments)    Patient noticed swelling of the ankles and feet when taking this, so he stopped using it.   I? Current Facility-Administered Medications  Medication Dose Route Frequency Provider Last Rate Last Admin   acetaminophen  (TYLENOL ) tablet 650 mg  650 mg Oral Q6H PRN Celinda Alm Lot, MD       Or   acetaminophen  (TYLENOL ) suppository 650 mg  650 mg Rectal Q6H PRN Celinda Alm Lot, MD       ampicillin  (OMNIPEN) 2 g in sodium chloride  0.9 % 100 mL IVPB  2 g Intravenous Q4H Celinda Alm Lot, MD 300 mL/hr at 05/30/24 1402 2 g at 05/30/24 1402   apixaban  (ELIQUIS ) tablet 5 mg  5 mg Oral BID Celinda Alm Lot, MD   5 mg at 05/30/24 1225   atorvastatin  (LIPITOR) tablet 40 mg  40 mg Oral QHS Celinda Alm Lot, MD       Chlorhexidine  Gluconate Cloth 2 % PADS 6 each  6 each Topical Daily Celinda Alm Lot, MD   6 each at 05/30/24 1310   gabapentin  (NEURONTIN ) capsule 300 mg  300 mg Oral TID Celinda Alm Lot, MD       insulin  aspart (novoLOG ) injection 0-15 Units  0-15 Units Subcutaneous TID WC Celinda Alm Lot, MD       lidocaine  (LIDODERM ) 5 % 1 patch  1 patch Transdermal Daily PRN Celinda Alm Lot, MD       metoprolol  succinate (TOPROL -XL) 24 hr tablet 25 mg  25 mg Oral QHS Celinda Alm Lot, MD       morphine  (MSIR) tablet 15 mg  15 mg Oral Q4H PRN Celinda Alm Lot, MD       morphine   (PF) 4 MG/ML injection 4 mg  4 mg Intravenous Q4H PRN Celinda Alm Lot, MD   4 mg at 05/30/24 1352   ondansetron  (ZOFRAN ) tablet 4 mg  4 mg Oral Q6H PRN Celinda Alm Lot, MD       Or   ondansetron  (ZOFRAN ) injection 4 mg  4 mg Intravenous Q6H PRN Celinda Alm Lot, MD       senna-docusate (Senokot-S) tablet 1-2 tablet  1-2 tablet Oral BID BM PRN Celinda Alm Lot, MD       sodium chloride  flush (NS) 0.9 % injection 10-40 mL  10-40 mL Intracatheter Q12H Ortiz, David Manuel, MD   40 mL at 05/30/24 1310   sodium chloride  flush (NS) 0.9 % injection 10-40 mL  10-40 mL Intracatheter PRN Celinda Alm Lot, MD       Facility-Administered Medications Ordered in Other Encounters  Medication Dose Route Frequency Provider Last Rate Last Admin   sodium chloride  flush (NS) 0.9 % injection 10 mL  10 mL Intracatheter PRN Lanny Callander, MD   10 mL at 02/15/24 1353     Abtx:  Anti-infectives (From admission, onward)    Start     Dose/Rate Route Frequency Ordered Stop   05/30/24 1600  ampicillin   Status:  Discontinued       Note to Pharmacy: As a continuous infusion. Indication:  Discitis First Dose: Yes Last Day of Therapy:  06/10/24 Labs - Once weekly:  CBC/D and BMP, Labs - Once weekly: ESR and CRP Method of administration: Ambulatory Pump (Continuous Infusion)   12 g Intravenous  Every 24 hours 05/30/24 1028 05/30/24 1246   05/30/24 1400  ampicillin  (OMNIPEN) 2 g in sodium chloride  0.9 % 100 mL IVPB        2 g 300 mL/hr over 20 Minutes Intravenous Every 4 hours 05/30/24 1246         REVIEW OF SYSTEMS:  Const: negative fever, negative chills, negative weight loss Eyes: negative diplopia or visual changes, negative eye pain ENT: negative coryza, negative sore throat Resp: negative cough, hemoptysis, dyspnea Cards: negative for chest pain, palpitations, lower extremity edema GU: Urostomy  GI: Negative for abdominal pain, diarrhea, bleeding, constipation Skin: negative for rash and  pruritus Heme: negative for easy bruising and gum/nose bleeding MS: Severe back pain, leg pain No difficulty with defecation He has a urostomy Neurolo:negative for headaches, dizziness, vertigo, memory problems  Psych: negative for feelings of anxiety, depression  Endocrine: negative for thyroid , diabetes Allergy/Immunology-lisinopril  and Lyrica : Objective:  VITALS:  BP 129/71   Pulse 73   Temp 97.8 F (36.6 C) (Oral)   Resp 14   SpO2 97%  LDA PICC line right brachial PHYSICAL EXAM:  General: Alert, cooperative, some distress on movement, appears stated age.  Head: Normocephalic, without obvious abnormality, atraumatic. Eyes: Conjunctivae clear, anicteric sclerae. Pupils are equal ENT Nares normal. No drainage or sinus tenderness. Lips, mucosa, and tongue normal. No Thrush Neck: Supple, symmetrical, no adenopathy, thyroid : non tender no carotid bruit and no JVD. Back: No CVA tenderness. Lungs: Clear to auscultation bilaterally. No Wheezing or Rhonchi. No rales. Heart: Regular rate and rhythm, no murmur, rub or gallop. Abdomen: Soft, urostomy left lower quadrant Extremities: atraumatic, no cyanosis. No edema. No clubbing Skin: No rashes or lesions. Or bruising Lymph: Cervical, supraclavicular normal. Neurologic: Grossly non-focal Pertinent Labs Lab Results CBC    Component Value Date/Time   WBC 8.0 05/29/2024 0002   RBC 3.79 (L) 05/29/2024 0002   HGB 10.2 (L) 05/29/2024 0002   HGB 12.6 (L) 05/09/2024 1327   HCT 31.5 (L) 05/29/2024 0002   PLT 171 05/29/2024 0002   PLT 248 05/09/2024 1327   MCV 83.1 05/29/2024 0002   MCH 26.9 05/29/2024 0002   MCHC 32.4 05/29/2024 0002   RDW 15.6 (H) 05/29/2024 0002   LYMPHSABS 1.8 05/29/2024 0002   MONOABS 1.0 05/29/2024 0002   EOSABS 0.0 05/29/2024 0002   BASOSABS 0.0 05/29/2024 0002       Latest Ref Rng & Units 05/29/2024   12:02 AM 05/15/2024    5:50 AM 05/13/2024    6:22 AM  CMP  Glucose 70 - 99 mg/dL 864  864  863   BUN 8  - 23 mg/dL 12  17  15    Creatinine 0.61 - 1.24 mg/dL 9.06  8.90  8.91   Sodium 135 - 145 mmol/L 136  133  132   Potassium 3.5 - 5.1 mmol/L 3.6  4.0  3.9   Chloride 98 - 111 mmol/L 103  103  99   CO2 22 - 32 mmol/L 23  21  25    Calcium  8.9 - 10.3 mg/dL 8.6  8.9  8.7       Microbiology: No results found for this or any previous visit (from the past 240 hours).    IMAGING RESULTS: MRI of the lumbar spine reviewed  I have personally reviewed the films ? Impression/Recommendation ? ? ? ___I have personally spent  ---minutes involved in face-to-face and non-face-to-face activities for this patient on the day of the visit. Professional time spent includes  the following activities: Preparing to see the patient (review of tests), Obtaining and/or reviewing separately obtained history (admission/discharge record), Performing a medically appropriate examination and/or evaluation , Ordering medications/tests/procedures, referring and communicating with other health care professionals, Documenting clinical information in the EMR, Independently interpreting results (not separately reported), Communicating results to the patient/family/caregiver, Counseling and educating the patient/family/caregiver and Care coordination (not separately reported).    ________________________________________________ Discussed with patient, requesting provider Note:  This document was prepared using Dragon voice recognition software and may include unintentional dictation errors.NAME: KAJ VASIL  DOB: [birth year]  MRN: [id number]  Date/Time: 05/30/2024 2:49 PM  REQUESTING PROVIDER Subjective:  REASON FOR CONSULT:  ? KESLEY GAFFEY [name] is a 73 y.o. with a history of   ID   Steroid/immune suppressants/splenectomy/Hardware Recent Procedure Surgery Injections Trauma Sick contacts Travel Antibiotic use Food- raw/exotic Animal bites Tick exposure Water  sports Fishing/hunting/animal bird  exposure Past Medical History:  Diagnosis Date   Anticoagulant long-term use    eliquis --- managed by cardiology   Aortic atherosclerosis (HCC) 05/30/2024   Arthritis    Bladder cancer [facility])    urologist--- dr nieves;  overlapping   Cataract    History of basal cell carcinoma (BCC) excision    History of COVID-19 05/2019   per pt mild symptoms that resolved   HLD (hyperlipidemia)    HTN (hypertension)    Hx of colonic polyps    Insulin  dependent type 2 diabetes mellitus [facility])    endocrinologist--- dr trixie --  (03-11-2022 per pt check blood sugar 1-2 times daily,  fasting sugar-- 118-120s)   Nocturia    Paroxysmal atrial fibrillation (HCC) 03/12/2019   cardiologist--- dr calhoun   Renal disorder    Ureterocele    Use of cane as ambulatory aid     Past Surgical History:  Procedure Laterality Date   BLEPHAROPLASTY Bilateral    per pt approx  2001;   upper eyelid's   COLONOSCOPY     last one approx 2023   CYSTOSCOPY W/ RETROGRADES N/A 12/08/2023   Procedure: CYSTOSCOPY;  Surgeon: Alvaro Ricardo KATHEE Mickey., MD;  [facility];  Service: Urology;  Laterality: N/A;  390 MINUTES NEEDED FOR CASE   CYSTOSCOPY WITH URETEROSCOPY AND STENT PLACEMENT Right 12/02/2022   Procedure: CYSTOSCOPY WITH RIGHT URETEROSCOPY, RIGHT DIAGNOSTIC URETEROSCOPY;  Surgeon: nieves Cough, MD;  [facility];  Service: Urology;  Laterality: Right;  90 MINS FOR CASE   CYSTOSCOPY/RETROGRADE/URETEROSCOPY Bilateral 05/05/2023   Procedure: CYSTOSCOPY BILATERAL RETROGRADE PYELOGRAM RIGHT URETEROSCOPY RIGHT STENT PLACEMENT;  Surgeon: nieves Cough, MD;  Location: [facility];  Service: Urology;  Laterality: Bilateral;  90 MINS FOR CASE   IR IMAGING GUIDED PORT INSERTION  05/30/2023   IR LUMBAR DISC ASPIRATION W/IMG GUIDE  05/12/2024   IR REMOVAL TUN ACCESS W/ PORT W/O FL MOD SED  03/29/2024   PATELLAR TENDON REPAIR Left 04/16/2004   @[facility]    ROBOT ASSITED LAPAROSCOPIC NEPHROURETERECTOMY Right  12/08/2023   Procedure: RIGHT ROBOT ASSISTED Nephroureterectomy;  Surgeon: Alvaro Ricardo KATHEE Mickey., MD;  [facility];  Service: Urology;  Laterality: Right;   ROBOT LAP RADICAL CYSTOPROSTATECTOMY PELVIC LYMPHADENECTOMY, NEOBLADDER  12/08/2023   Procedure: ROBOT ASSISTED LAPAROSCOPIC RADICAL CYSTOPROSTATECTOMY;  Surgeon: Alvaro Ricardo KATHEE Mickey., MD;  [facility];  Service: Urology;;   TOTAL URETHRECTOMY,RADICAL N/A 12/08/2023   Procedure: TOTAL URETHRECTOMY,RADICAL;  Surgeon: Alvaro Ricardo KATHEE Mickey., MD;  [facility];  Service: Urology;  Laterality: N/A;   TRANSESOPHAGEAL ECHOCARDIOGRAM (CATH LAB)  N/A 04/02/2024   Procedure: TRANSESOPHAGEAL ECHOCARDIOGRAM;  Surgeon: Delford Maude BROCKS, MD;  Location: [facility] INVASIVE CV LAB;  Service: Cardiovascular;  Laterality: N/A;   TRANSURETHRAL RESECTION OF BLADDER TUMOR N/A 06/18/2021   Procedure: TRANSURETHRAL RESECTION OF BLADDER TUMOR (TURBT) WITH RIGHT URETERAL STENT PLACEMENT, RIGHT URETEROSCOPY WITH DISTRACTION OF TUMOR, FULGURATION;  Surgeon: Nieves Cough, MD;  Location: [facility];  Service: Urology;  Laterality: N/A;   TRANSURETHRAL RESECTION OF BLADDER TUMOR N/A 03/15/2022   Procedure: TRANSURETHRAL RESECTION OF BLADDER TUMOR (TURBT) WITH BILATERAL URETEROSCOPY/URETHRAL/ DILATION/BILATERAL RETROGRADE PYELOGRAM/BIOPSY AND FULGURATION OF RIGHT URETERAL CANCER/RIGHT STENT PLACEMENT;  Surgeon: Nieves Cough, MD;  Location: [facility];  Service: Urology;  Laterality: N/A;   TRANSURETHRAL RESECTION OF BLADDER TUMOR N/A 05/05/2023   Procedure: TRANSURETHRAL RESECTION OF BLADDER TUMOR (TURBT) BLADDER BIOSPY;  Surgeon: Nieves Cough, MD;  Location: [facility];  Service: Urology;  Laterality: N/A;   TRANSURETHRAL RESECTION OF BLADDER TUMOR WITH MITOMYCIN -C N/A 12/02/2022   Procedure: TRANSURETHRAL RESECTION OF BLADDER TUMOR;  Surgeon: Nieves Cough, MD;  [facility];  Service: Urology;   Laterality: N/A;   TRANSURETHRAL RESECTION OF PROSTATE  05/05/2023   Procedure: TRANSURETHRAL RESECTION OF THE PROSTATE (TURP);  Surgeon: Nieves Cough, MD;  Location: [facility];  Service: Urology;;    Social History   Socioeconomic History   Marital status: Married    Spouse name: Not on file   Number of children: 1   Years of education: Not on file   Highest education level: Some college, no degree  Occupational History   Occupation: DVD's   Occupation: Occupational hygienist: Self Employed  Tobacco Use   Smoking status: Some Days    Current packs/day: 0.25    Average packs/day: 0.3 packs/day for 13.6 years (3.6 ttl pk-yrs)    Types: Cigars, Cigarettes    Start date: 2012    Last attempt to quit: 02/22/2011   Smokeless tobacco: Never   Tobacco comments:    Smoking cessation instruction/counseling given:  counseled patient on the dangers of tobacco use, advised patient to stop smoking, and reviewed strategies to maximize success   Vaping Use   Vaping status: Never Used  Substance and Sexual Activity   Alcohol  use: Yes    Alcohol /week: 3.0 standard drinks of alcohol     Types: 3 Shots of liquor per week    Comment: 6-8oz /wk OCC MARTINI 2 TO 3 X WEEK   Drug use: Never   Sexual activity: Not Currently  Other Topics Concern   Not on file  Social History Narrative   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   Current Smoker   Social Drivers of Health   Financial Resource Strain: Low Risk  (04/08/2024)   Overall Financial Resource Strain (CARDIA)    Difficulty of Paying Living Expenses: Not hard at all  Food Insecurity: No Food Insecurity (05/30/2024)   Hunger Vital Sign    Worried About Running Out of Food in the Last Year: Never true    Ran Out of Food in the Last Year: Never true  Transportation Needs: No Transportation Needs (05/30/2024)   PRAPARE - Administrator, Civil Service (Medical): No    Lack of  Transportation (Non-Medical): No  Physical Activity: Inactive (04/08/2024)   Exercise Vital Sign    Days of Exercise per Week: 0 days    Minutes of Exercise per Session: 0 min  Stress:  Stress Concern Present (04/08/2024)   [employer] of Occupational Health - Occupational Stress Questionnaire    Feeling of Stress: To some extent  Social Connections: Unknown (05/30/2024)   Social Connection and Isolation Panel    Frequency of Communication with Friends and Family: Twice a week    Frequency of Social Gatherings with Friends and Family: Once a week    Attends Religious Services: Not on Marketing executive or Organizations: Not on file    Attends Banker Meetings: Not on file    Marital Status: Not on file  Recent Concern: Social Connections - Moderately Isolated (03/28/2024)   Social Connection and Isolation Panel    Frequency of Communication with Friends and Family: More than three times a week    Frequency of Social Gatherings with Friends and Family: Twice a week    Attends Religious Services: Never    Database administrator or Organizations: No    Attends Banker Meetings: Never    Marital Status: Married  Catering manager Violence: Unknown (05/30/2024)   Humiliation, Afraid, Rape, and Kick questionnaire    Fear of Current or Ex-Partner: No    Emotionally Abused: No    Physically Abused: Not on file    Sexually Abused: No    Family History  Problem Relation Age of Onset   Pancreatic cancer Mother 15   Cancer Paternal Uncle        unknown type cancer   Aortic aneurysm Other        family history   Cancer Other        Breast (1st degree relative)   Colon cancer Neg Hx    Rectal cancer Neg Hx    Stomach cancer Neg Hx    Esophageal cancer Neg Hx    Allergies  Allergen Reactions   Lisinopril  Swelling and Other (See Comments)    Tongue and throat became swollen   Lyrica  [Pregabalin ] Swelling and Other (See Comments)    Patient noticed  swelling of the ankles and feet when taking this, so he stopped using it.   I? Current Facility-Administered Medications  Medication Dose Route Frequency Provider Last Rate Last Admin   acetaminophen  (TYLENOL ) tablet 650 mg  650 mg Oral Q6H PRN Celinda Alm Lot, MD       Or   acetaminophen  (TYLENOL ) suppository 650 mg  650 mg Rectal Q6H PRN Celinda Alm Lot, MD       ampicillin  (OMNIPEN) 2 g in sodium chloride  0.9 % 100 mL IVPB  2 g Intravenous Q4H Celinda Alm Lot, MD 300 mL/hr at 05/30/24 1402 2 g at 05/30/24 1402   apixaban  (ELIQUIS ) tablet 5 mg  5 mg Oral BID Celinda Alm Lot, MD   5 mg at 05/30/24 1225   atorvastatin  (LIPITOR) tablet 40 mg  40 mg Oral QHS Celinda Alm Lot, MD       Chlorhexidine  Gluconate Cloth 2 % PADS 6 each  6 each Topical Daily Celinda Alm Lot, MD   6 each at 05/30/24 1310   gabapentin  (NEURONTIN ) capsule 300 mg  300 mg Oral TID Celinda Alm Lot, MD       insulin  aspart (novoLOG ) injection 0-15 Units  0-15 Units Subcutaneous TID WC Celinda Alm Lot, MD       lidocaine  (LIDODERM ) 5 % 1 patch  1 patch Transdermal Daily PRN Celinda Alm Lot, MD       metoprolol  succinate (TOPROL -XL) 24 hr tablet 25  mg  25 mg Oral QHS Celinda Alm Lot, MD       morphine  (MSIR) tablet 15 mg  15 mg Oral Q4H PRN Celinda Alm Lot, MD       morphine  (PF) 4 MG/ML injection 4 mg  4 mg Intravenous Q4H PRN Celinda Alm Lot, MD   4 mg at 05/30/24 1352   ondansetron  (ZOFRAN ) tablet 4 mg  4 mg Oral Q6H PRN Celinda Alm Lot, MD       Or   ondansetron  (ZOFRAN ) injection 4 mg  4 mg Intravenous Q6H PRN Celinda Alm Lot, MD       senna-docusate (Senokot-S) tablet 1-2 tablet  1-2 tablet Oral BID BM PRN Celinda Alm Lot, MD       sodium chloride  flush (NS) 0.9 % injection 10-40 mL  10-40 mL Intracatheter Q12H Ortiz, David Manuel, MD   40 mL at 05/30/24 1310   sodium chloride  flush (NS) 0.9 % injection 10-40 mL  10-40 mL Intracatheter PRN Celinda Alm Lot, MD        Facility-Administered Medications Ordered in Other Encounters  Medication Dose Route Frequency Provider Last Rate Last Admin   sodium chloride  flush (NS) 0.9 % injection 10 mL  10 mL Intracatheter PRN Lanny Callander, MD   10 mL at 02/15/24 1353     Abtx:  Anti-infectives (From admission, onward)    Start     Dose/Rate Route Frequency Ordered Stop   05/30/24 1600  ampicillin   Status:  Discontinued       Note to Pharmacy: As a continuous infusion. Indication:  Discitis First Dose: Yes Last Day of Therapy:  06/10/24 Labs - Once weekly:  CBC/D and BMP, Labs - Once weekly: ESR and CRP Method of administration: Ambulatory Pump (Continuous Infusion)   12 g Intravenous Every 24 hours 05/30/24 1028 05/30/24 1246   05/30/24 1400  ampicillin  (OMNIPEN) 2 g in sodium chloride  0.9 % 100 mL IVPB        2 g 300 mL/hr over 20 Minutes Intravenous Every 4 hours 05/30/24 1246         REVIEW OF SYSTEMS:  Const: negative fever, negative chills, negative weight loss Eyes: negative diplopia or visual changes, negative eye pain ENT: negative coryza, negative sore throat Resp: negative cough, hemoptysis, dyspnea Cards: negative for chest pain, palpitations, lower extremity edema GU: negative for frequency, dysuria and hematuria GI: Negative for abdominal pain, diarrhea, bleeding, constipation Skin: negative for rash and pruritus Heme: negative for easy bruising and gum/nose bleeding MS: negative for myalgias, arthralgias, back pain and muscle weakness Neurolo:negative for headaches, dizziness, vertigo, memory problems  Psych: negative for feelings of anxiety, depression  Endocrine: negative for thyroid , diabetes Allergy/Immunology- negative for any medication or food allergies ? Pertinent Positives include : Objective:  VITALS:  BP 129/71   Pulse 73   Temp 97.8 F (36.6 C) (Oral)   Resp 14   SpO2 97%  LDA Foley Central line Other drainage tubes PHYSICAL EXAM:  General: Alert,  cooperative, no distress, appears stated age.  Head: Normocephalic, without obvious abnormality, atraumatic. Eyes: Conjunctivae clear, anicteric sclerae. Pupils are equal ENT Nares normal. No drainage or sinus tenderness. Lips, mucosa, and tongue normal. No Thrush Neck: Supple, symmetrical, no adenopathy, thyroid : non tender no carotid bruit and no JVD. Back: No CVA tenderness. Lungs: Clear to auscultation bilaterally. No Wheezing or Rhonchi. No rales. Heart: Regular rate and rhythm, no murmur, rub or gallop. Abdomen: Soft, non-tender,not distended. Bowel sounds normal. No masses Extremities:  atraumatic, no cyanosis. No edema. No clubbing Skin: No rashes or lesions. Or bruising Lymph: Cervical, supraclavicular normal. Neurologic: Grossly non-focal Pertinent Labs Lab Results CBC    Component Value Date/Time   WBC 8.0 05/29/2024 0002   RBC 3.79 (L) 05/29/2024 0002   HGB 10.2 (L) 05/29/2024 0002   HGB 12.6 (L) 05/09/2024 1327   HCT 31.5 (L) 05/29/2024 0002   PLT 171 05/29/2024 0002   PLT 248 05/09/2024 1327   MCV 83.1 05/29/2024 0002   MCH 26.9 05/29/2024 0002   MCHC 32.4 05/29/2024 0002   RDW 15.6 (H) 05/29/2024 0002   LYMPHSABS 1.8 05/29/2024 0002   MONOABS 1.0 05/29/2024 0002   EOSABS 0.0 05/29/2024 0002   BASOSABS 0.0 05/29/2024 0002       Latest Ref Rng & Units 05/29/2024   12:02 AM 05/15/2024    5:50 AM 05/13/2024    6:22 AM  CMP  Glucose 70 - 99 mg/dL 864  864  863   BUN 8 - 23 mg/dL 12  17  15    Creatinine 0.61 - 1.24 mg/dL 9.06  8.90  8.91   Sodium 135 - 145 mmol/L 136  133  132   Potassium 3.5 - 5.1 mmol/L 3.6  4.0  3.9   Chloride 98 - 111 mmol/L 103  103  99   CO2 22 - 32 mmol/L 23  21  25    Calcium  8.9 - 10.3 mg/dL 8.6  8.9  8.7       Microbiology: No results found for this or any previous visit (from the past 240 hours).  Lines and Device Date on insertion # of days DC  Central line     Foley     ETT       IMAGING RESULTS: Progressive collapse of  the left L5 vertebral body.  Extensive paraspinal soft tissue enhancement I have personally reviewed the films ? 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  He is 100% adherence and has not missed any doses He comes with worsening back pain.  An MRI shows some progression of the vertebral body collapse. There are no clinical signs of worsening infection like fever or chills. Leukocytosis is normal Will get a sed rate and CRP The MRI initially worsens before it starts to stabilize For the pain he may benefit from lumbar brace Recommend that we continue the IV antibiotics and we may extend it for 10 weeks followed by oral antibiotic.  Because of recurrent Enterococcus and action he will need indefinite suppression with antibiotic The patient is on immune checkpoint inhibitor nivolumab  which she last received on 05/09/2024.  Even though it is not like typical chemotherapy there may be some impact on infection.  Will discuss with his oncologist Dr. Ileana about holding it?  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  Left hydronephrosis with ureteral stent in May 2025 Discuss with urologist regarding changing the stent   Anemia This evaluation involved complex antimicrobial management.  __I have personally spent  --75-minutes involved in face-to-face and non-face-to-face activities for this patient on the day of the visit. Professional time spent includes the following activities: Preparing to see the patient (review of tests), Obtaining and/or reviewing separately obtained  history (admission/discharge record),  Performing a medically appropriate examination and/or evaluation , Ordering medications/tests/procedures, referring and communicating with other health care professionals, Documenting clinical information in the EMR, Independently interpreting results (not separately reported), Communicating results to the patient/, Counseling and educating the patient and Care coordination (not separately reported) with the requesting provider.    ________________________________________________  Note:  This document was prepared using Dragon voice recognition software and may include unintentional dictation errors.

## 2024-05-30 NOTE — Plan of Care (Signed)
  Problem: Education: Goal: Knowledge of the procedure and recovery process will improve Outcome: Progressing   Problem: Bowel/Gastric: Goal: Gastrointestinal status for postoperative course will improve Outcome: Progressing   Problem: Pain Management: Goal: General experience of comfort will improve Outcome: Progressing

## 2024-05-30 NOTE — H&P (Signed)
 History and Physical    Patient: Robert [name] FMW:[id number] DOB: 20-Apr-1951 DOA: 05/29/2024 DOS: the patient was seen and examined on 05/30/2024 PCP: Garald Karlynn GAILS, MD  Patient coming from: Home  Chief Complaint:  Chief Complaint  Patient presents with   Back Pain   HPI: Robert [name] is a 73 y.o. male with medical history significant of COVID-19, cataracts, basal cell carcinoma of the skin, hyperlipidemia, hypertension, stage III CKD paroxysmal atrial fibrillation on apixaban , bladder cancer status postchemotherapy, cystoprostatectomy, right nephroureterectomy,total urethrectomy who is being treated for L4-L5 discitis/osteomyelitis with with phlegmon via continuous infusion pump with ampicillin  after recent cultures grew Enterococcus faecalis who presented emergency department due to progressively worse back pain for the past few days however, after he got out of bed his pain got a lot worse he decided to come to the emergency department.  He has been taking gabapentin , methocarbamol  and tramadol  without significant relief.  Sometimes he uses lidocaine  patches.  No NSAIDs due to DOAC use.  No falls or direct trauma.  No bowel incontinence. He denied fever, chills, rhinorrhea, sore throat, wheezing or hemoptysis.  No chest pain, palpitations, diaphoresis, PND, orthopnea or pitting edema of the lower extremities.  No abdominal pain, nausea, emesis, diarrhea, constipation, melena or hematochezia.  No flank pain or hematuria.  No polyuria, polydipsia, polyphagia or blurred vision.   Lab work: CBC showed a white count of 8.0, hemoglobin 10.2 g/dL and platelets 828.  BMP showed a glucose of 135 and calcium  of 8.6 mg/dL.  Imaging: MRI of the lumbar spine with and without contrast show progressive collapse of the L5 vertebral body with diffuse edema and enhancement in the L4-L5 vertebral bodies, and narrow progressive signal change enhancement within the S1 vertebral body.  Findings  consistent with progressive disc osteomyelitis at L4-5 and progressively into the S1 vertebral body.  Extensive paraspinal soft tissue enhancement and ventral epidural enhancement at L4 and L5.  Stable lumbar disc disease at L1-2 and L2-3.  Broad-based disc protrusion with moderate central and right foraminal stenosis at L3-4, similar to the prior study.  MRI of the thoracic spine with and without contrast with no evidence of discitis or other acute pathology in the thoracic spine.  There is shallow central disc protrusion at T8-9 without significant stenosis.   ED course: Initial vital signs were temperature 98.5 F, pulse 83, respiration 17, BP 130/76 mmHg O2 sat 100% on room air.  The patient received acetaminophen  650 mg p.o. x 1, diazepam  5 mg IVP, diazepam  2.5 mg IVP, morphine  8 mg IVP x 2, hydromorphone  1 mg IVP and ondansetron  4 mg IVP.  Case discussed with neurosurgery on-call who does not believe that the patient needs a procedure at this time.  Review of Systems: As mentioned in the history of present illness. All other systems reviewed and are negative. Past Medical History:  Diagnosis Date   Anticoagulant long-term use    eliquis --- managed by cardiology   Arthritis    Bladder cancer [facility])    urologist--- dr nieves;  overlapping   Cataract    History of basal cell carcinoma (BCC) excision    History of COVID-19 05/2019   per pt mild symptoms that resolved   HLD (hyperlipidemia)    HTN (hypertension)    Hx of colonic polyps    Insulin  dependent type 2 diabetes mellitus [facility])    endocrinologist--- dr trixie --  (03-11-2022 per pt check blood sugar 1-2 times daily,  fasting  sugar-- 118-120s)   Nocturia    Paroxysmal atrial fibrillation (HCC) 03/12/2019   cardiologist--- dr calhoun   Renal disorder    Ureterocele    Use of cane as ambulatory aid    Past Surgical History:  Procedure Laterality Date   BLEPHAROPLASTY Bilateral    per pt approx  2001;   upper eyelid's    COLONOSCOPY     last one approx 2023   CYSTOSCOPY W/ RETROGRADES N/A 12/08/2023   Procedure: CYSTOSCOPY;  Surgeon: Alvaro Ricardo KATHEE Mickey., MD;  Location: [facility];  Service: Urology;  Laterality: N/A;  390 MINUTES NEEDED FOR CASE   CYSTOSCOPY WITH URETEROSCOPY AND STENT PLACEMENT Right 12/02/2022   Procedure: CYSTOSCOPY WITH RIGHT URETEROSCOPY, RIGHT DIAGNOSTIC URETEROSCOPY;  Surgeon: Nieves Cough, MD;  Location: [facility];  Service: Urology;  Laterality: Right;  90 MINS FOR CASE   CYSTOSCOPY/RETROGRADE/URETEROSCOPY Bilateral 05/05/2023   Procedure: CYSTOSCOPY BILATERAL RETROGRADE PYELOGRAM RIGHT URETEROSCOPY RIGHT STENT PLACEMENT;  Surgeon: Nieves Cough, MD;  Location: [facility];  Service: Urology;  Laterality: Bilateral;  90 MINS FOR CASE   IR IMAGING GUIDED PORT INSERTION  05/30/2023   IR LUMBAR DISC ASPIRATION W/IMG GUIDE  05/12/2024   IR REMOVAL TUN ACCESS W/ PORT W/O FL MOD SED  03/29/2024   PATELLAR TENDON REPAIR Left 04/16/2004   @[facility]    ROBOT ASSITED LAPAROSCOPIC NEPHROURETERECTOMY Right 12/08/2023   Procedure: RIGHT ROBOT ASSISTED Nephroureterectomy;  Surgeon: Alvaro Ricardo KATHEE Mickey., MD;  Location: [facility];  Service: Urology;  Laterality: Right;   ROBOT LAP RADICAL CYSTOPROSTATECTOMY PELVIC LYMPHADENECTOMY, NEOBLADDER  12/08/2023   Procedure: ROBOT ASSISTED LAPAROSCOPIC RADICAL CYSTOPROSTATECTOMY;  Surgeon: Alvaro Ricardo KATHEE Mickey., MD;  Location: [facility];  Service: Urology;;   TOTAL URETHRECTOMY,RADICAL N/A 12/08/2023   Procedure: TOTAL URETHRECTOMY,RADICAL;  Surgeon: Alvaro Ricardo KATHEE Mickey., MD;  Location: [facility];  Service: Urology;  Laterality: N/A;   TRANSESOPHAGEAL ECHOCARDIOGRAM (CATH LAB) N/A 04/02/2024   Procedure: TRANSESOPHAGEAL ECHOCARDIOGRAM;  Surgeon: Delford Maude BROCKS, MD;  Location[facility] CV LAB;  Service: Cardiovascular;  Laterality: N/A;   TRANSURETHRAL RESECTION OF BLADDER TUMOR N/A 06/18/2021   Procedure: TRANSURETHRAL RESECTION OF BLADDER TUMOR (TURBT) WITH  RIGHT URETERAL STENT PLACEMENT, RIGHT URETEROSCOPY WITH DISTRACTION OF TUMOR, FULGURATION;  Surgeon: Nieves Cough, MD;  Location: [facility];  Service: Urology;  Laterality: N/A;   TRANSURETHRAL RESECTION OF BLADDER TUMOR N/A 03/15/2022   Procedure: TRANSURETHRAL RESECTION OF BLADDER TUMOR (TURBT) WITH BILATERAL URETEROSCOPY/URETHRAL/ DILATION/BILATERAL RETROGRADE PYELOGRAM/BIOPSY AND FULGURATION OF RIGHT URETERAL CANCER/RIGHT STENT PLACEMENT;  Surgeon: Nieves Cough, MD;  Location: [facility];  Service: Urology;  Laterality: N/A;   TRANSURETHRAL RESECTION OF BLADDER TUMOR N/A 05/05/2023   Procedure: TRANSURETHRAL RESECTION OF BLADDER TUMOR (TURBT) BLADDER BIOSPY;  Surgeon: Nieves Cough, MD;  Location: [facility];  Service: Urology;  Laterality: N/A;   TRANSURETHRAL RESECTION OF BLADDER TUMOR WITH MITOMYCIN -C N/A 12/02/2022   Procedure: TRANSURETHRAL RESECTION OF BLADDER TUMOR;  Surgeon: Nieves Cough, MD;  Location: [facility];  Service: Urology;  Laterality: N/A;   TRANSURETHRAL RESECTION OF PROSTATE  05/05/2023   Procedure: TRANSURETHRAL RESECTION OF THE PROSTATE (TURP);  Surgeon: Nieves Cough, MD;  Location: [facility];  Service: Urology;;   Social History:  reports that he has been smoking cigars and cigarettes. He started smoking about 13 years ago. He has a 3.6 pack-year smoking history. He has never used smokeless tobacco. He reports current alcohol  use of about 3.0 standard drinks of alcohol  per week.  He reports that he does not use drugs.  Allergies  Allergen Reactions   Lisinopril  Swelling and Other (See Comments)    Tongue and throat became swollen   Lyrica  [Pregabalin ] Swelling and Other (See Comments)    Patient noticed swelling of the ankles and feet when taking this, so he stopped using it.    Family History  Problem Relation Age of Onset   Pancreatic cancer Mother 55   Cancer Paternal Uncle         unknown type cancer   Aortic aneurysm Other        family history   Cancer Other        Breast (1st degree relative)   Colon cancer Neg Hx    Rectal cancer Neg Hx    Stomach cancer Neg Hx    Esophageal cancer Neg Hx     Prior to Admission medications   Medication Sig Start Date End Date Taking? Authorizing Provider  diazepam  (VALIUM ) 5 MG tablet Take 1 tablet (5 mg total) by mouth every 6 (six) hours as needed for muscle spasms. 05/29/24  Yes Dasie Faden, MD  ampicillin  (PRINCIPEN) 500 MG capsule Take 500 mg by mouth 3 (three) times daily. 05/21/24   [provider]  ampicillin  IVPB Inject 12 g into the vein daily for 26 days. As a continuous infusion. Indication:  Discitis First Dose: Yes Last Day of Therapy:  06/10/24 Labs - Once weekly:  CBC/D and BMP, Labs - Once weekly: ESR and CRP Method of administration: Ambulatory Pump (Continuous Infusion) Method of administration may be changed at the discretion of home infusion pharmacist based upon assessment of the patient and/or caregiver's ability to self-administer the medication ordered. 05/15/24 06/10/24  Gonfa, Taye T, MD  apixaban  (ELIQUIS ) 5 MG TABS tablet Take 1 tablet (5 mg total) by mouth 2 (two) times daily. 12/21/23   Nahser, Aleene PARAS, MD  atorvastatin  (LIPITOR) 40 MG tablet Take 1 tablet (40 mg total) by mouth at bedtime. 02/07/24   Cheryle Page, MD  B Complex-C (B-COMPLEX WITH VITAMIN C) tablet Take 1 tablet by mouth 3 (three) times a week.    [provider]  Cholecalciferol  (VITAMIN D -3 PO) Take 1 tablet by mouth 3 (three) times a week.    [provider]  EPINEPHrine  0.3 mg/0.3 mL IJ SOAJ injection Inject 0.3 mg into the muscle as needed for anaphylaxis. 09/14/23   Victor Lynwood DASEN, PA-C  gabapentin  (NEURONTIN ) 300 MG capsule Take 300 mg by mouth 3 (three) times daily. 05/22/24   [provider]  glucose blood test strip 1 each by Other route daily. Use daily for glucose control - Dx 250.00  04/21/20   Theophilus Andrews, Tully GRADE, MD  lidocaine  (LIDODERM ) 5 % Place 1 patch onto the skin daily. Remove & Discard patch within 12 hours or as directed by MD 04/03/24   Arlon Carliss ORN, DO  methocarbamol  (ROBAXIN ) 500 MG tablet Take 1 tablet (500 mg total) by mouth every 8 (eight) hours as needed for muscle spasms. 04/03/24   Arlon Carliss ORN, DO  metoprolol  succinate (TOPROL -XL) 25 MG 24 hr tablet Take 1 tablet (25 mg total) by mouth daily. 09/19/23   Nahser, Aleene PARAS, MD  propranolol  (INDERAL ) 10 MG tablet TAKE 1 TABLET BY MOUTH 4 TIMES DAILY AS NEEDED FOR PALPITATIONS, IRREGULAR OR FAST HEART RATE 10/25/22   Nahser, Aleene PARAS, MD  Semaglutide , 1 MG/DOSE, (OZEMPIC , 1 MG/DOSE,) 4 MG/3ML SOPN INJECT 1 MG UNDER THE SKIN ONCE  A WEEK AS DIRECTED 07/20/23   Trixie File, MD  senna-docusate (SENOKOT-S) 8.6-50 MG tablet Take 1-2 tablets by mouth 2 (two) times daily between meals as needed for mild constipation or moderate constipation. 05/15/24   Gonfa, Taye T, MD  traMADol  (ULTRAM ) 50 MG tablet Take 1 tablet (50 mg total) by mouth every 8 (eight) hours as needed. 05/23/24   Plotnikov, Aleksei V, MD  TYLENOL  500 MG tablet Take 500-1,000 mg by mouth every 6 (six) hours as needed for mild pain (pain score 1-3) or headache.    [provider]    Physical Exam: Vitals:   05/30/24 0505 05/30/24 0615 05/30/24 9366 05/30/24 0634  BP: 122/65 112/65    Pulse: 90 75  79  Resp: 20 14  14   Temp: 98.8 F (37.1 C)  98.4 F (36.9 C) 98.2 F (36.8 C)  TempSrc: Oral     SpO2: 100% 99%  100%   Physical Exam Vitals and nursing note reviewed.  Constitutional:      General: He is awake. He is not in acute distress.    Appearance: Normal appearance. He is ill-appearing.  HENT:     Head: Normocephalic.     Nose: No rhinorrhea.     Mouth/Throat:     Mouth: Mucous membranes are moist.  Eyes:     General: No scleral icterus.    Pupils: Pupils are equal, round, and reactive to light.  Neck:      Vascular: No JVD.  Cardiovascular:     Rate and Rhythm: Normal rate and regular rhythm.     Heart sounds: S1 normal and S2 normal.  Pulmonary:     Effort: Pulmonary effort is normal.     Breath sounds: Normal breath sounds. No wheezing, rhonchi or rales.  Abdominal:     General: Bowel sounds are normal. There is no distension.     Palpations: Abdomen is soft.     Tenderness: There is no abdominal tenderness. There is no right CVA tenderness or left CVA tenderness.  Musculoskeletal:     Cervical back: Neck supple.     Lumbar back: Tenderness present. Decreased range of motion.     Right lower leg: No edema.     Left lower leg: No edema.  Skin:    General: Skin is warm and dry.  Neurological:     Mental Status: He is alert and oriented to person, place, and time.  Psychiatric:        Mood and Affect: Mood normal.        Behavior: Behavior normal. Behavior is cooperative.     Data Reviewed:  Results are pending, will review when available.  04/02/2024 echocardiogram report. IMPRESSIONS:   1. No evidence of SBE or vegetations.   2. Left ventricular ejection fraction, by estimation, is 60 to 65%. The  left ventricle has normal function. There is mild left ventricular  hypertrophy.   3. Right ventricular systolic function is normal. The right ventricular  size is normal.   4. Left atrial size was mildly dilated. No left atrial/left atrial  appendage thrombus was detected.   5. The mitral valve is abnormal. Mild to moderate mitral valve  regurgitation. No evidence of mitral stenosis. Moderate mitral annular  calcification.   6. The aortic valve is tricuspid. There is moderate calcification of the  aortic valve. There is moderate thickening of the aortic valve. Aortic  valve regurgitation is mild. Aortic valve sclerosis/calcification is  present, without any evidence of  aortic  stenosis.   7. 3D performed of the mitral valve and 3D performed of the aortic valve  and  demonstrates AV and MV visualized in 3D.   Assessment and Plan: Principal Problem:   Intractable back pain In the setting of:   Vertebral osteomyelitis (HCC) Observation/MedSurg. Continue IV fluids. Keep n.p.o. for now. Analgesics as needed. Antiemetics as needed. Pantoprazole 40 mg IVP daily. Follow CBC, CMP in AM.  Active Problems:   Paroxysmal A-fib (HCC) CHA?DS?-VASc Score of at least 4. (Age, DM, HTN and vascular disease). Continue apixaban  5 mg p.o. twice daily. Continue metoprolol  succinate 25 mg p.o. daily. Keep electrolytes optimized.    Hyperlipidemia Aortic atherosclerosis (HCC) Continue atorvastatin  40 mg p.o. daily.    Essential hypertension On metoprolol  as above. Monitor blood pressure.    Type 2 diabetes mellitus without complication,  without long-term current use of insulin  (HCC) Carbohydrate modified diet. CBG monitoring with RI SS. Using weekly semaglutide . Check hemoglobin A1c.    CKD (chronic kidney disease), stage III (HCC) Monitor renal function and electrolytes.    Normocytic anemia In the setting of persistent infection. Monitor hematocrit and hemoglobin.     Advance Care Planning:   Code Status: Full Code   Consults: Neurosurgery   Family Communication:   Severity of Illness: The appropriate patient status for this patient is OBSERVATION. Observation status is judged to be reasonable and necessary in order to provide the required intensity of service to ensure the patient's safety. The patient's presenting symptoms, physical exam findings, and initial radiographic and laboratory data in the context of their medical condition is felt to place them at decreased risk for further clinical deterioration. Furthermore, it is anticipated that the patient will be medically stable for discharge from the hospital within 2 midnights of admission.   Author: Alm Dorn Castor, MD 05/30/2024 8:50 AM  For on call review [email or web address].   This  document was prepared using Dragon voice recognition software and may contain some unintended transcription errors.

## 2024-05-30 NOTE — Progress Notes (Signed)
 Brief neurosurgery note:  Called to evaluate this patient with ongoing L4-5 disc/osteo. 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

## 2024-05-31 ENCOUNTER — Encounter (HOSPITAL_COMMUNITY): Payer: Self-pay

## 2024-05-31 DIAGNOSIS — M549 Dorsalgia, unspecified: Secondary | ICD-10-CM | POA: Diagnosis not present

## 2024-05-31 LAB — GLUCOSE, CAPILLARY
Glucose-Capillary: 104 mg/dL — ABNORMAL HIGH (ref 70–99)
Glucose-Capillary: 169 mg/dL — ABNORMAL HIGH (ref 70–99)
Glucose-Capillary: 103 mg/dL — ABNORMAL HIGH (ref 70–99)
Glucose-Capillary: 197 mg/dL — ABNORMAL HIGH (ref 70–99)

## 2024-05-31 LAB — CBC
HCT: 29.9 % — ABNORMAL LOW (ref 39.0–52.0)
Hemoglobin: 9.4 g/dL — ABNORMAL LOW (ref 13.0–17.0)
MCH: 26.4 pg (ref 26.0–34.0)
MCHC: 31.4 g/dL (ref 30.0–36.0)
MCV: 84 fL (ref 80.0–100.0)
Platelets: 178 K/uL (ref 150–400)
RBC: 3.56 MIL/uL — ABNORMAL LOW (ref 4.22–5.81)
RDW: 15.5 % (ref 11.5–15.5)
WBC: 7.1 K/uL (ref 4.0–10.5)
nRBC: 0 % (ref 0.0–0.2)

## 2024-05-31 LAB — COMPREHENSIVE METABOLIC PANEL WITH GFR
ALT: 10 U/L (ref 0–44)
AST: 10 U/L — ABNORMAL LOW (ref 15–41)
Albumin: 2.5 g/dL — ABNORMAL LOW (ref 3.5–5.0)
Alkaline Phosphatase: 69 U/L (ref 38–126)
Anion gap: 11 (ref 5–15)
BUN: 14 mg/dL (ref 8–23)
CO2: 24 mmol/L (ref 22–32)
Calcium: 8.7 mg/dL — ABNORMAL LOW (ref 8.9–10.3)
Chloride: 102 mmol/L (ref 98–111)
Creatinine, Ser: 1.2 mg/dL (ref 0.61–1.24)
GFR, Estimated: >60 mL/min (ref >60)
Glucose, Bld: 123 mg/dL — ABNORMAL HIGH (ref 70–99)
Potassium: 4 mmol/L (ref 3.5–5.1)
Sodium: 137 mmol/L (ref 135–145)
Total Bilirubin: 0.6 mg/dL (ref 0.0–1.2)
Total Protein: 6.1 g/dL — ABNORMAL LOW (ref 6.5–8.1)

## 2024-05-31 LAB — HEMOGLOBIN A1C
Hgb A1c MFr Bld: 7.1 % — ABNORMAL HIGH (ref 4.8–5.6)
Mean Plasma Glucose: 157 mg/dL

## 2024-05-31 LAB — C-REACTIVE PROTEIN: CRP: 13.2 mg/dL — ABNORMAL HIGH (ref <1.0)

## 2024-05-31 LAB — SEDIMENTATION RATE: Sed Rate: 75 mm/h — ABNORMAL HIGH (ref 0–16)

## 2024-05-31 MED ORDER — OXYCODONE HCL ER 10 MG PO T12A
10.0000 mg | EXTENDED_RELEASE_TABLET | Freq: Two times a day (BID) | ORAL | Status: DC
  Administered 2024-05-31 – 2024-06-03 (×7): 10 mg via ORAL
  Filled 2024-05-31 (×6): qty 1

## 2024-05-31 MED ORDER — AMPICILLIN IV (FOR PTA / DISCHARGE USE ONLY): 12.0000 g | INTRAVENOUS | 0 refills | Status: DC

## 2024-05-31 MED ORDER — ENSURE PLUS HIGH PROTEIN PO LIQD
237.0000 mL | Freq: Two times a day (BID) | ORAL | Status: DC
  Administered 2024-06-01 – 2024-06-12 (×26): 237 mL via ORAL

## 2024-05-31 NOTE — Plan of Care (Signed)
  Problem: Education: Goal: Knowledge of the procedure and recovery process will improve Outcome: Progressing   Problem: Bowel/Gastric: Goal: Gastrointestinal status for postoperative course will improve Outcome: Progressing   Problem: Pain Management: Goal: General experience of comfort will improve Outcome: Progressing   Problem: Skin Integrity: Goal: Demonstration of wound healing without infection will improve Outcome: Progressing   Problem: Urinary Elimination: Goal: Ability to avoid or minimize complications of infection will improve Outcome: Progressing Goal: Ability to achieve and maintain urine output will improve Outcome: Progressing Goal: Home care management will improve Outcome: Progressing   Problem: Education: Goal: Knowledge of General Education information will improve Description: Including pain rating scale, medication(s)/side effects and non-pharmacologic comfort measures Outcome: Progressing   Problem: Health Behavior/Discharge Planning: Goal: Ability to manage health-related needs will improve Outcome: Progressing   Problem: Clinical Measurements: Goal: Ability to maintain clinical measurements within normal limits will improve Outcome: Progressing Goal: Will remain free from infection Outcome: Progressing Goal: Diagnostic test results will improve Outcome: Progressing Goal: Respiratory complications will improve Outcome: Progressing Goal: Cardiovascular complication will be avoided Outcome: Progressing   Problem: Activity: Goal: Risk for activity intolerance will decrease Outcome: Progressing   Problem: Nutrition: Goal: Adequate nutrition will be maintained Outcome: Progressing   Problem: Coping: Goal: Level of anxiety will decrease Outcome: Progressing   Problem: Elimination: Goal: Will not experience complications related to bowel motility Outcome: Progressing Goal: Will not experience complications related to urinary  retention Outcome: Progressing   Problem: Pain Managment: Goal: General experience of comfort will improve and/or be controlled Outcome: Progressing   Problem: Safety: Goal: Ability to remain free from injury will improve Outcome: Progressing   Problem: Skin Integrity: Goal: Risk for impaired skin integrity will decrease Outcome: Progressing   Problem: Education: Goal: Ability to describe self-care measures that may prevent or decrease complications (Diabetes Survival Skills Education) will improve Outcome: Progressing Goal: Individualized Educational Video(s) Outcome: Progressing   Problem: Coping: Goal: Ability to adjust to condition or change in health will improve Outcome: Progressing   Problem: Fluid Volume: Goal: Ability to maintain a balanced intake and output will improve Outcome: Progressing   Problem: Health Behavior/Discharge Planning: Goal: Ability to identify and utilize available resources and services will improve Outcome: Progressing Goal: Ability to manage health-related needs will improve Outcome: Progressing   Problem: Metabolic: Goal: Ability to maintain appropriate glucose levels will improve Outcome: Progressing   Problem: Nutritional: Goal: Maintenance of adequate nutrition will improve Outcome: Progressing Goal: Progress toward achieving an optimal weight will improve Outcome: Progressing   Problem: Skin Integrity: Goal: Risk for impaired skin integrity will decrease Outcome: Progressing   Problem: Tissue Perfusion: Goal: Adequacy of tissue perfusion will improve Outcome: Progressing

## 2024-05-31 NOTE — Progress Notes (Signed)
 Date of Admission:  05/29/2024    ID: Robert [name] is a 73 y.o. male  Principal Problem:   Intractable back pain Active Problems:   Hyperlipidemia   Essential hypertension   Type 2 diabetes mellitus without complication, without long-term current use of insulin  (HCC)   Paroxysmal A-fib (HCC)   CKD (chronic kidney disease), stage III (HCC)   Normocytic anemia   Vertebral osteomyelitis (HCC)   Aortic atherosclerosis (HCC)   Robert [name] is a 73 y.o. male with a history of bladder carcinoma status post robotic right nephrectomy, right ureterectomy, radical cystectomy and excision of prostatic urethra and pelvic lymph node dissection on 12/08/2023, Serratia bacteremia and urinary tract infection in April 2025 and treated with appropriate antibiotics for 2 weeks, receiving nivolumab  every 4 weeks since April 2025, readmission to the hospital in May for AKI, stent placement in the left ureter, Enterococcus faecalis bacteremia, port removal and appropriate antibiotic treatment for 2 weeks with a negative TEE, readmission to the hospital 03/27/2024 until 04/03/2024 for Enterococcus bacteremia and treated again with 2 weeks of antibiotic with negative surveillance culture, worsening back pain in July and readmission 7/18 to 05/15/2024 for back pain and MRI showed L4-L5 discitis and osteomyelitis with L5-S1 discitis and epidural phlegmon and he underwent a disc aspiration on 05/12/2024 which was E faecalis had a PICC placement and started on ampicillin  the plan to give it for at least 6 weeks until 06/10/2024 He presents today with worsening back pain Patient has been walking with a walker at home but for the past 2 days the pain is worse and he says it is like a 12 He does not have any fever or chills Current pain medication with tramadol  and gabapentin  is not helping.  He saw the pain management with the spine center and is supposed to go for a brace fitting today but he is  admitted   Subjective: Patient is still in severe pain Says has not been able to get out of bed No fever or chills  Medications:   apixaban   5 mg Oral BID   atorvastatin   40 mg Oral QHS   Chlorhexidine  Gluconate Cloth  6 each Topical Daily   gabapentin   300 mg Oral TID   insulin  aspart  0-15 Units Subcutaneous TID WC   metoprolol  succinate  25 mg Oral QHS   sodium chloride  flush  10-40 mL Intracatheter Q12H    Objective: Vital signs in last 24 hours: Patient Vitals for the past 24 hrs:  BP Temp Temp src Pulse Resp SpO2 Height Weight  05/31/24 1420 113/63 98.1 F (36.7 C) -- 76 16 97 % -- --  05/31/24 1036 110/66 98.2 F (36.8 C) Oral 71 18 -- 6' 3 (1.905 m) 85.7 kg  05/31/24 0448 110/66 98.2 F (36.8 C) -- 71 18 96 % -- --  05/30/24 2211 119/62 98.4 F (36.9 C) Oral 79 18 96 % -- --  05/30/24 1835 119/64 98.9 F (37.2 C) Oral 83 18 99 % -- --  Right PICC   PHYSICAL EXAM:  General: Alert, cooperative,  distress on moving, appears stated age.  Lungs: Bilateral air entry Heart: -s1s2 Abdomen: Soft, non-tender,not distended. Bowel sounds normal. No masses Extremities: atraumatic, no cyanosis. No edema. No clubbing Skin: No rashes or lesions. Or bruising Lymph: Cervical, supraclavicular normal. Neurologic: Grossly non-focal  Lab Results    Latest Ref Rng & Units 05/31/2024    2:13 AM 05/29/2024   12:02 AM  05/15/2024    5:50 AM  CBC  WBC 4.0 - 10.5 K/uL 7.1  8.0  6.9   Hemoglobin 13.0 - 17.0 g/dL 9.4  89.7  88.8   Hematocrit 39.0 - 52.0 % 29.9  31.5  33.1   Platelets 150 - 400 K/uL 178  171  182        Latest Ref Rng & Units 05/31/2024    2:13 AM 05/29/2024   12:02 AM 05/15/2024    5:50 AM  CMP  Glucose 70 - 99 mg/dL 876  864  864   BUN 8 - 23 mg/dL 14  12  17    Creatinine 0.61 - 1.24 mg/dL 8.79  9.06  8.90   Sodium 135 - 145 mmol/L 137  136  133   Potassium 3.5 - 5.1 mmol/L 4.0  3.6  4.0   Chloride 98 - 111 mmol/L 102  103  103   CO2 22 - 32 mmol/L 24  23  21     Calcium  8.9 - 10.3 mg/dL 8.7  8.6  8.9   Total Protein 6.5 - 8.1 g/dL 6.1     Total Bilirubin 0.0 - 1.2 mg/dL 0.6     Alkaline Phos 38 - 126 U/L 69     AST 15 - 41 U/L 10     ALT 0 - 44 U/L 10         Microbiology: 05/31/2024 blood culture no growth Studies/Results: MR THORACIC SPINE W WO CONTRAST Result Date: 05/30/2024 EXAM: MRI THORACIC SPINE WITH AND WITHOUT INTRAVENOUS CONTRAST 05/30/2024 07:49:32 AM TECHNIQUE: Multiplanar multisequence MRI of the thoracic spine was performed with and without the administration of intravenous contrast. COMPARISON: None available. CLINICAL HISTORY: Bone mass or bone pain, thoracic spine, aggressive features on xray. Acute onset of back pain beginning yesterday morning; difficulty walking due to pain and weakness; recently treated for discitis. FINDINGS: BONES AND ALIGNMENT: Normal alignment. Normal vertebral body heights. Bone marrow signal is unremarkable. No abnormal enhancement. SPINAL CORD: Normal spinal cord volume. Normal spinal cord signal. SOFT TISSUES: Unremarkable. DEGENERATIVE CHANGES: No significant disc herniation. No spinal canal stenosis or neural foraminal narrowing. No pathologic enhancement or abnormal disc or endplate signal is present in the thoracic spine. A shallow central disc protrusion is present at T8-9 without significant stenosis. IMPRESSION: 1. No evidence of discitis or other acute pathology in the thoracic spine. 2. Shallow central disc protrusion at T8-9 without significant stenosis. Electronically signed by: Lonni Necessary MD 05/30/2024 08:05 AM EDT RP Workstation: [id number]   MR Lumbar Spine W Tommye Contrast Result Date: 05/30/2024 EXAM: MR Lumbar Spine with and without intravenous contrast. 05/30/2024 07:49:32 AM TECHNIQUE: Multiplanar multisequence MRI of the lumbar spine was performed with and without the administration of intravenous contrast. COMPARISON: MRI of lumbar spine without and with contrast 05/10/2024. CLINICAL  HISTORY: Low back pain, prior surgery, new symptoms. Gadavist  8mL; Acute onset of back pain beginning yesterday morning; difficulty walking due to pain and weakness; recently treated for discitis. FINDINGS: BONES AND ALIGNMENT: Progressive collapse of the L5 vertebral body is present. Height now measures 17 mm compared with 24 mm previously. Diffuse edema and enhancement is again noted in the L4 and L5 vertebral bodies. Progressive marrow signal change and enhancement is present within the S1 vertebral body. SPINAL CORD: The conus medullaris terminates at L1-2. SOFT TISSUES: Extensive paraspinous soft tissue enhancement is present. L1-L2: Lumbar disc disease is stable. L2-L3: Lumbar disc disease is stable. L3-L4: A broad-based disc protrusion with moderate central  and right foraminal stenosis is similar to the prior study. L4-L5: Epidural enhancement is again noted. L5-S1: Epidural enhancement is again noted. IMPRESSION: 1. Progressive collapse of the L5 vertebral body with diffuse edema and enhancement in the L4 and L5 vertebral bodies, and progressive marrow signal change and enhancement within the S1 vertebral body. Findings are consistent with progressive disc osteomyelitis at L4-5 and progressively into the S1 vertebral body. 2. Extensive paraspinous soft tissue enhancement and ventral epidural enhancement at L4 and L5. 3. Stable lumbar disc disease at L1-2 and L2-3. 4. Broad-based disc protrusion with moderate central and right foraminal stenosis at L3-4, similar to the prior study. Findings were called to doctor Medford Tegeler at 8 o'clock am. Electronically signed by: Lonni Necessary MD 05/30/2024 08:03 AM EDT RP Workstation: [id number]     Assessment/Plan:  Vertebral osteomyelitis and discitis at L4-L5, L5-S1 due to Enterococcus faecalis bacteremia with paraspinal infection small epidural phlegmon diagnosed in July 2025 and he has been on appropriate antibiotic ampicillin  IV as a continuous  infusion of 12 g every 24 hours.  He has been 100% adherent as outpatient.  He is supposed to finish 6 weeks of that on 06/10/2024 But he got admitted to the hospital with severe back pain which has worsened in the last 2 days The MRI shows progressive collapse of L5 vertebral body I called interventional radiologist to see whether there is any undrained abscess or collections that can be aspirated.  But there is none.    I discussed with neurosurgeon DR.Garst to see whether there is any surgical intervention that can be done for the severe pain.  But as there is no neurological symptoms or vertebral instability surgery is not indicated currently So patient needs better pain control.  A lumbar brace may help May consider consider consulting palliative for pain management Continue with IV ampicillin  and may extend the antibiotics for a few more weeks beyond 6 weeks. ( May be 10 weeks) Will follow ESR and's CRP as outpatient.  The latest  is slightly increased than previous but we will keep an eye on it and we will refer to neurosurgeon if needed as outpatient.  The patient is on immune checkpoint inhibitor nivolumab  which he last received on 05/09/2024.  Even though it is not a chemotherapy agent there may be some impact on infection I sent a message to his oncologist Dr. 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  The blood cultures negative of this admission  Bladder carcinoma status post robotic right nephrectomy, ureterectomy, radical cystectomy creation of ileal urostomy after resection of prostate urethra.  Left hydronephrosis and ureteral stent in May 2025 Anemia  Discussed the management in great detail with the patient and Dr.Ogbata  OPAT orders entered ID will sign off Call if needed  He will follow-up with Dr. dennise on 06/10/2024 -2 PM.  OPAT Orders Discharge antibiotics: Ampicillin  IV  to be given as a continuous infusion of 12 g  every 24 hours Duration: Total of 10 weeks End Date: 07/08/24   [facility] Per Protocol:   Labs weekly while on IV antibiotics: _X_ CBC with differential   _X_ CMP _X_ CRP _X_ ESR     _X_ Please leave PIC in place until doctor has seen patient or been notified   Fax weekly lab results  promptly to [phone]   Clinic Follow Up Appt: 06/10/24 at 2pm with Dr.Mayanka singh     Call [phone]  with any questions or critical values

## 2024-05-31 NOTE — Progress Notes (Signed)
 PHARMACY CONSULT NOTE FOR:  OUTPATIENT  PARENTERAL ANTIBIOTIC THERAPY (OPAT)  Indication: Discitis   Regimen: Ampicillin  12 gm every 24 hours as a continuous infusion End date: 07/08/24  IV antibiotic discharge orders are pended. To discharging provider:  please sign these orders via discharge navigator,  Select New Orders & click on the button choice - Manage This Unsigned Work.    Thank you for allowing pharmacy to be a part of this patient's care.  Izetta Carl, PharmD PGY1 Pharmacy Resident [facility]  05/31/2024 9:12 AM

## 2024-05-31 NOTE — Progress Notes (Addendum)
 PROGRESS NOTE    Robert [name]  FMW:[id number] DOB: January 26, 1951 DOA: 05/29/2024 PCP: Garald Karlynn GAILS, MD  Outpatient Specialists:     Brief Narrative:  Patient is a 73 year old male with past medical history significant for hypertension, hyperlipidemia, CKD 3 paroxysmal atrial fibrillation on apixaban , bladder cancer s/p chemotherapy and cystoprostatectomy, right nephroureterectomy and total urethrectomy.  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  Patient was admitted with worsening back pain.  Lumbar spine MRI done on 05/30/2024 with and without contrast revealed: 1. Progressive collapse of the L5 vertebral body with diffuse edema and enhancement in the L4 and L5 vertebral bodies, and progressive marrow signal change and enhancement within the S1 vertebral body. Findings are consistent with progressive disc osteomyelitis at L4-5 and progressively into the S1 vertebral body. 2. Extensive paraspinous soft tissue enhancement and ventral epidural enhancement at L4 and L5. 3. Stable lumbar disc disease at L1-2 and L2-3. 4. Broad-based disc protrusion with moderate central and right foraminal stenosis at L3-4, similar to the prior study.   Thoracic spine MRI with and without contrast done on 05/30/2024 revealed: 1. No evidence of discitis or other acute pathology in the thoracic spine. 2. Shallow central disc protrusion at T8-9 without significant stenosis.   05/31/2024: Discussed with infectious disease team.  Infectious disease team has advised continuing current antibiotics.  As per infectious disease team, neurosurgery team has recommended thoracolumbar brace.  Patient will be discharged back home once pain is optimized.  Patient will continue with antibiotics on discharge.  Assessment & Plan:   Principal Problem:   Intractable back pain Active Problems:    Hyperlipidemia   Essential hypertension   Type 2 diabetes mellitus without complication, without long-term current use of insulin  (HCC)   Paroxysmal A-fib (HCC)   CKD (chronic kidney disease), stage III (HCC)   Normocytic anemia   Vertebral osteomyelitis (HCC)   Aortic atherosclerosis (HCC)   Intractable back pain/Vertebral osteomyelitis (HCC): -Continue IV antibiotics (as above) - Optimize pain control. - Discharge back home when pain is controlled. - Thoracolumbar brace use. - PT OT input.    Paroxysmal A-fib (HCC): Continue apixaban  5 mg p.o. twice daily. Continue metoprolol  succinate 25 mg p.o. daily. Heart rate is controlled.   Hyperlipidemia Aortic atherosclerosis (HCC) Continue atorvastatin  40 mg p.o. daily.   Essential hypertension On metoprolol  as above. Blood pressure is controlled.  Type 2 diabetes mellitus without complication, without long-term current use of insulin  (HCC): Carbohydrate modified diet. CBG monitoring with RI SS. Weekly semaglutide . Last A1c done 02/13/2024 was 7.1%.   CKD (chronic kidney disease), stage III (HCC) -Stable. - Serum creatinine of 1.2. - Continue to monitor renal function and electrolytes.   Normocytic anemia -Continue to monitor closely. - Likely multifactorial.     DVT prophylaxis: Apixaban . Code Status: Full code. Family Communication:  Disposition Plan: Inpatient.   Consultants:  Infectious disease. Neurosurgery  Procedures:  None.  Antimicrobials:  Continuous ampicillin  pump.   Subjective: Back pain.  Objective: Vitals:   05/30/24 2211 05/31/24 0448 05/31/24 1036 05/31/24 1420  BP: 119/62 110/66 110/66 113/63  Pulse: 79 71 71 76  Resp: 18 18 18 16   Temp: 98.4 F (36.9 C) 98.2 F (36.8 C) 98.2 F (36.8 C) 98.1 F (36.7 C)  TempSrc: Oral  Oral   SpO2: 96% 96%  97%  Weight:   85.7 kg   Height:   6' 3 (1.905 m)  Intake/Output Summary (Last 24 hours) at 05/31/2024 1557 Last data filed at  05/31/2024 0911 Gross per 24 hour  Intake 460 ml  Output 900 ml  Net -440 ml   Filed Weights   05/31/24 1036  Weight: 85.7 kg    Examination:  General exam: Appears calm.  Patient is pale. Respiratory system: Clear to auscultation. Respiratory effort normal. Cardiovascular system: S1 & S2 heard Gastrointestinal system: Abdomen is soft and nontender. Central nervous system: Alert and oriented.    Data Reviewed: I have personally reviewed following labs and imaging studies  CBC: Recent Labs  Lab 05/29/24 0002 05/31/24 0213  WBC 8.0 7.1  NEUTROABS 5.0  --   HGB 10.2* 9.4*  HCT 31.5* 29.9*  MCV 83.1 84.0  PLT 171 178   Basic Metabolic Panel: Recent Labs  Lab 05/29/24 0002 05/31/24 0213  NA 136 137  K 3.6 4.0  CL 103 102  CO2 23 24  GLUCOSE 135* 123*  BUN 12 14  CREATININE 0.93 1.20  CALCIUM  8.6* 8.7*   GFR: Estimated Creatinine Clearance: 65.5 mL/min (by C-G formula based on SCr of 1.2 mg/dL). Liver Function Tests: Recent Labs  Lab 05/31/24 0213  AST 10*  ALT 10  ALKPHOS 69  BILITOT 0.6  PROT 6.1*  ALBUMIN 2.5*   No results for input(s): LIPASE, AMYLASE in the last 168 hours. No results for input(s): AMMONIA in the last 168 hours. Coagulation Profile: No results for input(s): INR, PROTIME in the last 168 hours. Cardiac Enzymes: No results for input(s): CKTOTAL, CKMB, CKMBINDEX, TROPONINI in the last 168 hours. BNP (last 3 results) No results for input(s): PROBNP in the last 8760 hours. HbA1C: No results for input(s): HGBA1C in the last 72 hours. CBG: Recent Labs  Lab 05/30/24 2212 05/31/24 0752 05/31/24 1124  GLUCAP 154* 104* 169*   Lipid Profile: No results for input(s): CHOL, HDL, LDLCALC, TRIG, CHOLHDL, LDLDIRECT in the last 72 hours. Thyroid  Function Tests: No results for input(s): TSH, T4TOTAL, FREET4, T3FREE, THYROIDAB in the last 72 hours. Anemia Panel: No results for input(s):  VITAMINB12, FOLATE, FERRITIN, TIBC, IRON, RETICCTPCT in the last 72 hours. Urine analysis:    Component Value Date/Time   COLORURINE YELLOW 03/27/2024 1406   APPEARANCEUR HAZY (A) 03/27/2024 1406   LABSPEC 1.017 03/27/2024 1406   PHURINE 5.0 03/27/2024 1406   GLUCOSEU 150 (A) 03/27/2024 1406   GLUCOSEU 100 (A) 02/27/2023 1547   HGBUR SMALL (A) 03/27/2024 1406   HGBUR negative 12/07/2009 0811   BILIRUBINUR NEGATIVE 03/27/2024 1406   BILIRUBINUR neg 04/29/2021 1443   KETONESUR NEGATIVE 03/27/2024 1406   PROTEINUR 100 (A) 03/27/2024 1406   UROBILINOGEN 0.2 02/27/2023 1547   NITRITE POSITIVE (A) 03/27/2024 1406   LEUKOCYTESUR MODERATE (A) 03/27/2024 1406   Sepsis Labs: @LABRCNTIP (procalcitonin:4,lacticidven:4)  ) Recent Results (from the past 240 hours)  Culture, blood (Routine X 2) w Reflex to ID Panel     Status: None (Preliminary result)   Collection Time: 05/31/24  4:38 AM   Specimen: BLOOD  Result Value Ref Range Status   Specimen Description   Final    BLOOD BLOOD RIGHT ARM Performed at [facility], [address], [place], [place] [address]    Special Requests   Final    BOTTLES DRAWN AEROBIC ONLY Blood Culture results may not be optimal due to an inadequate volume of blood received in culture bottles Performed at [facility], [address], [place], [place] [address]  Culture   Final    NO GROWTH < 12 HOURS Performed at [facility], [address] [place], [place] [address]    Report Status PENDING  Incomplete  Culture, blood (Routine X 2) w Reflex to ID Panel     Status: None (Preliminary result)   Collection Time: 05/31/24  4:44 AM   Specimen: BLOOD  Result Value Ref Range Status   Specimen Description   Final    BLOOD BLOOD LEFT HAND Performed at [facility], [address] [place], [place] [address]    Special Requests   Final    BOTTLES DRAWN AEROBIC ONLY Blood Culture  results may not be optimal due to an inadequate volume of blood received in culture bottles Performed at [facility], [address] [place], [place] [address]    Culture   Final    NO GROWTH < 12 HOURS Performed at [facility], [address], [place], [place] [address]    Report Status PENDING  Incomplete         Radiology Studies: MR THORACIC SPINE W WO CONTRAST Result Date: 05/30/2024 EXAM: MRI THORACIC SPINE WITH AND WITHOUT INTRAVENOUS CONTRAST 05/30/2024 07:49:32 AM TECHNIQUE: Multiplanar multisequence MRI of the thoracic spine was performed with and without the administration of intravenous contrast. COMPARISON: None available. CLINICAL HISTORY: Bone mass or bone pain, thoracic spine, aggressive features on xray. Acute onset of back pain beginning yesterday morning; difficulty walking due to pain and weakness; recently treated for discitis. FINDINGS: BONES AND ALIGNMENT: Normal alignment. Normal vertebral body heights. Bone marrow signal is unremarkable. No abnormal enhancement. SPINAL CORD: Normal spinal cord volume. Normal spinal cord signal. SOFT TISSUES: Unremarkable. DEGENERATIVE CHANGES: No significant disc herniation. No spinal canal stenosis or neural foraminal narrowing. No pathologic enhancement or abnormal disc or endplate signal is present in the thoracic spine. A shallow central disc protrusion is present at T8-9 without significant stenosis. IMPRESSION: 1. No evidence of discitis or other acute pathology in the thoracic spine. 2. Shallow central disc protrusion at T8-9 without significant stenosis. Electronically signed by: Lonni Necessary MD 05/30/2024 08:05 AM EDT RP Workstation: [id number]   MR Lumbar Spine W Tommye Contrast Result Date: 05/30/2024 EXAM: MR Lumbar Spine with and without intravenous contrast. 05/30/2024 07:49:32 AM TECHNIQUE: Multiplanar multisequence MRI of the lumbar spine was performed with and without the administration of  intravenous contrast. COMPARISON: MRI of lumbar spine without and with contrast 05/10/2024. CLINICAL HISTORY: Low back pain, prior surgery, new symptoms. Gadavist  8mL; Acute onset of back pain beginning yesterday morning; difficulty walking due to pain and weakness; recently treated for discitis. FINDINGS: BONES AND ALIGNMENT: Progressive collapse of the L5 vertebral body is present. Height now measures 17 mm compared with 24 mm previously. Diffuse edema and enhancement is again noted in the L4 and L5 vertebral bodies. Progressive marrow signal change and enhancement is present within the S1 vertebral body. SPINAL CORD: The conus medullaris terminates at L1-2. SOFT TISSUES: Extensive paraspinous soft tissue enhancement is present. L1-L2: Lumbar disc disease is stable. L2-L3: Lumbar disc disease is stable. L3-L4: A broad-based disc protrusion with moderate central and right foraminal stenosis is similar to the prior study. L4-L5: Epidural enhancement is again noted. L5-S1: Epidural enhancement is again noted. IMPRESSION: 1. Progressive collapse of the L5 vertebral body with diffuse edema and enhancement in the L4 and L5 vertebral bodies, and progressive marrow signal change and enhancement within the S1 vertebral body. Findings are consistent  with progressive disc osteomyelitis at L4-5 and progressively into the S1 vertebral body. 2. Extensive paraspinous soft tissue enhancement and ventral epidural enhancement at L4 and L5. 3. Stable lumbar disc disease at L1-2 and L2-3. 4. Broad-based disc protrusion with moderate central and right foraminal stenosis at L3-4, similar to the prior study. Findings were called to doctor Medford Tegeler at 8 o'clock am. Electronically signed by: Lonni Necessary MD 05/30/2024 08:03 AM EDT RP Workstation: [id number]        Scheduled Meds:  apixaban   5 mg Oral BID   atorvastatin   40 mg Oral QHS   Chlorhexidine  Gluconate Cloth  6 each Topical Daily   gabapentin   300 mg Oral  TID   insulin  aspart  0-15 Units Subcutaneous TID WC   metoprolol  succinate  25 mg Oral QHS   sodium chloride  flush  10-40 mL Intracatheter Q12H   Continuous Infusions:  ampicillin  (OMNIPEN) IV 2 g (05/31/24 1402)     LOS: 1 day    Time spent: 55 minutes.    Leatrice Chapel, MD  [facility] Pager #: [phone] 7PM-7AM contact night coverage as above

## 2024-05-31 NOTE — TOC Initial Note (Signed)
 Transition of Care [facility]) - Initial/Assessment Note    Patient Details  Name: Robert [name] MRN: [id number] Date of Birth: 10-23-1951  [facility] CM/SW Contact:    Doneta Glenys DASEN, RN Phone Number: 05/31/2024, 1:34 PM  Clinical Narrative:                 Presented for back pain. CM introduced and explained role. Patient states lives in a house with wife whom he is the caregiver. His wife has dementia. PCP/insurance verified;DME-cane and walker; Currently active with [facility] and [facility]. Both updated on patients admission. Will need a resumption of care order at discharge. Patient has neighbors Beverley and Garrel who can assist with transportation at discharge. IP care management following  Expected Discharge Plan: Home w Home Health Services Barriers to Discharge: Continued Medical Work up   Patient Goals and CMS Choice Patient states their goals for this hospitalization and ongoing recovery are:: Home CMS Medicare.gov Compare Post Acute Care list provided to::  (NA) Choice offered to / list presented to : NA [place] ownership interest in [facility].provided to:: Parent NA    Expected Discharge Plan and Services In-house Referral: NA Discharge Planning Services: CM Consult Post Acute Care Choice: Home Health Living arrangements for the past 2 months: Single Family Home                 DME Arranged: N/A DME Agency: NA       HH Arranged: RN, IV Antibiotics HH Agency: [facility] Date [facility] [address] [address] Agency Contacted: 05/31/24 Time HH Agency Contacted: [phone] Representative spoke with at [facility] Agency: Amy  Prior Living Arrangements/Services Living arrangements for the past 2 months: Single Family Home Lives with:: Spouse Patient language and need for interpreter reviewed:: Yes Do you feel safe going back to the place where you live?: Yes      Need for Family Participation in Patient Care: Yes (Comment) Care giver support system in place?:  Yes (comment) Current home services: DME (cane and walker) Criminal Activity/Legal Involvement Pertinent to Current Situation/Hospitalization: No - Comment as needed  Activities of Daily Living   ADL Screening (condition at time of admission) Independently performs ADLs?: Yes (appropriate for developmental age) Is the patient deaf or have difficulty hearing?: No Does the patient have difficulty seeing, even when wearing glasses/contacts?: No Does the patient have difficulty concentrating, remembering, or making decisions?: No  Permission Sought/Granted Permission sought to share information with : Case Manager Permission granted to share information with : Yes, Verbal Permission Granted  Share Information with NAME: Garrel and Beverley Iha ne[place]ors [phone], [phone]  Permission granted to share info w AGENCY: [facility] and [facility]        Emotional Assessment Appearance:: Appears stated age Attitude/Demeanor/Rapport: Engaged Affect (typically observed): Appropriate Orientation: : Oriented to Self, Oriented to Place, Oriented to  Time, Oriented to Situation Alcohol  / Substance Use: Not Applicable Psych Involvement: No (comment)  Admission diagnosis:  Intractable back pain [M54.9] Discitis, unspecified spinal region [M46.40] Chronic back pain, unspecified back location, unspecified back pain laterality [M54.9, G89.29] Patient Active Problem List   Diagnosis Date Noted   Intractable back pain 05/30/2024   Normocytic anemia 05/30/2024   Vertebral osteomyelitis (HCC) 05/30/2024   Aortic atherosclerosis (HCC) 05/30/2024   Diskitis 05/10/2024   Low back pain 04/17/2024   Lumbar radiculopathy 03/28/2024   Hyponatremia 03/28/2024   CKD (chronic kidney disease), stage III (HCC) 03/28/2024   H/O left nephrectomy  03/19/2024   Neuropathy 03/19/2024   AKI (acute kidney injury) (HCC) 03/01/2024   Serratia marcescens infection 03/01/2024   Chills 02/29/2024   Medication management  02/29/2024   Hydronephrosis of left kidney 02/05/2024   Complicated UTI (urinary tract infection) 02/05/2024   Bacteremia 02/04/2024   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  10/09/2023   Hypomagnesemia 07/03/2023   Port-A-Cath in place 06/09/2023   Elevated PSA measurement 02/28/2023   Preop examination 02/27/2023   Osteoarthritis of right hip 06/29/2022   Bladder cancer (HCC) 01/31/2022   Primary osteoarthritis of right hip 10/19/2021   Long term current use of anticoagulant -Eliquis  03/12/2019   Paroxysmal A-fib (HCC) 03/12/2019   Overweight (BMI 25.0-29.9) 01/03/2019   Type 2 diabetes mellitus without complication, without long-term current use of insulin  (HCC) 07/23/2018   BPH associated with nocturia 03/01/2017   Irregular heart rate 06/23/2011   Hx of colonic polyps    ERECTILE DYSFUNCTION 12/19/2008   SKIN CANCER, HX OF 12/19/2008   Hyperlipidemia 08/21/2007   Essential hypertension 08/21/2007   PCP:  Garald Karlynn GAILS, MD Pharmacy:   [id number] [place], [place] - [address] [place] [address] Phone: [phone] Fax: [phone]     Social Drivers of Health (SDOH) Social History: SDOH Screenings   Food Insecurity: No Food Insecurity (05/30/2024)  Housing: High Risk (05/30/2024)  Transportation Needs: No Transportation Needs (05/30/2024)  Utilities: Not At Risk (05/30/2024)  Alcohol  Screen: Low Risk  (04/08/2024)  Depression (PHQ2-9): Low Risk  (05/09/2024)  Financial Resource Strain: Low Risk  (04/08/2024)  Physical Activity: Inactive (04/08/2024)  Social Connections: Moderately Integrated (05/31/2024)  Recent Concern: Social Connections - Moderately Isolated (03/28/2024)  Stress: Stress Concern Present (04/08/2024)  Tobacco Use: High Risk (05/31/2024)  Health Literacy: Adequate Health Literacy (04/08/2024)   SDOH Interventions:     Readmission Risk Interventions    04/01/2024    2:58 PM 02/07/2024    2:07 PM 12/10/2023    4:50 PM  Readmission Risk  Prevention Plan  Transportation Screening Complete Complete Complete  PCP or Specialist Appt within 5-7 Days   Complete  PCP or Specialist Appt within 3-5 Days Complete Complete   Home Care Screening   Complete  Medication Review (RN CM)   Complete  HRI or Home Care Consult Complete Complete   Social Work Consult for Recovery Care Planning/Counseling Complete Complete   Palliative Care Screening Not Applicable Not Applicable   Medication Review Oceanographer) Complete Referral to Pharmacy

## 2024-05-31 NOTE — Plan of Care (Signed)
  Problem: Education: Goal: Knowledge of the procedure and recovery process will improve Outcome: Progressing   

## 2024-06-01 DIAGNOSIS — M549 Dorsalgia, unspecified: Secondary | ICD-10-CM | POA: Diagnosis not present

## 2024-06-01 LAB — GLUCOSE, CAPILLARY
Glucose-Capillary: 234 mg/dL — ABNORMAL HIGH (ref 70–99)
Glucose-Capillary: 111 mg/dL — ABNORMAL HIGH (ref 70–99)
Glucose-Capillary: 172 mg/dL — ABNORMAL HIGH (ref 70–99)
Glucose-Capillary: 158 mg/dL — ABNORMAL HIGH (ref 70–99)

## 2024-06-01 MED ORDER — GUAIFENESIN-DM 100-10 MG/5ML PO SYRP
5.0000 mL | ORAL_SOLUTION | ORAL | Status: DC | PRN
  Administered 2024-06-01 – 2024-06-07 (×2): 5 mL via ORAL
  Filled 2024-06-01 (×2): qty 5

## 2024-06-01 NOTE — Progress Notes (Signed)
 Orthopedic Tech Progress Note Patient Details:  Robert [name] 14-May-1951 [id number]  Ortho Devices Type of Ortho Device: Lumbar corsett Ortho Device/Splint Interventions: Ordered, Application, Adjustment, Removal  TLSO was not appropriate for this patient, due urostomy bag. LSO was placed and fitted with patient receiving very little relief, per patient. Unable to sit up to EOB. LSO was removed after 7-8 minutes. OOB only. [facility] has been called for fitting of appropriate orthotic. Post Interventions Patient Tolerated: Fair Instructions Provided: Adjustment of device  Robert [name] 06/01/2024, 6:10 PM

## 2024-06-01 NOTE — Plan of Care (Signed)
  Problem: Education: Goal: Knowledge of the procedure and recovery process will improve Outcome: Progressing   

## 2024-06-01 NOTE — Progress Notes (Signed)
 PROGRESS NOTE    Robert [name]  FMW:[id number] DOB: 11/24/1950 DOA: 05/29/2024 PCP: Garald Karlynn GAILS, MD  Outpatient Specialists:     Brief Narrative:  Patient is a 73 year old male with past medical history significant for hypertension, hyperlipidemia, CKD 3 paroxysmal atrial fibrillation on apixaban , bladder cancer s/p chemotherapy and cystoprostatectomy, right nephroureterectomy and total urethrectomy.  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  Patient was admitted with worsening back pain.  Lumbar spine MRI done on 05/30/2024 with and without contrast revealed: 1. Progressive collapse of the L5 vertebral body with diffuse edema and enhancement in the L4 and L5 vertebral bodies, and progressive marrow signal change and enhancement within the S1 vertebral body. Findings are consistent with progressive disc osteomyelitis at L4-5 and progressively into the S1 vertebral body. 2. Extensive paraspinous soft tissue enhancement and ventral epidural enhancement at L4 and L5. 3. Stable lumbar disc disease at L1-2 and L2-3. 4. Broad-based disc protrusion with moderate central and right foraminal stenosis at L3-4, similar to the prior study.   Thoracic spine MRI with and without contrast done on 05/30/2024 revealed: 1. No evidence of discitis or other acute pathology in the thoracic spine. 2. Shallow central disc protrusion at T8-9 without significant stenosis.   05/31/2024: Discussed with infectious disease team.  Infectious disease team has advised continuing current antibiotics.  As per infectious disease team, neurosurgery team has recommended thoracolumbar brace.  Patient will be discharged back home once pain is optimized.  Patient will continue with antibiotics on discharge.  06/01/2024: Pain control is slowly improving.  TLSO deemed inappropriate for patient.  Input from  orthopedic/neurosurgery team is appreciated.  Continue to optimize pain.  Patient will be discharged once pain control is optimized.    Assessment & Plan:   Principal Problem:   Intractable back pain Active Problems:   Hyperlipidemia   Essential hypertension   Type 2 diabetes mellitus without complication, without long-term current use of insulin  (HCC)   Paroxysmal A-fib (HCC)   CKD (chronic kidney disease), stage III (HCC)   Normocytic anemia   Vertebral osteomyelitis (HCC)   Aortic atherosclerosis (HCC)   Intractable back pain/Vertebral osteomyelitis (HCC): -Continue IV antibiotics (as above) - Optimize pain control. -Continue gabapentin  300 Mg p.o. 3 times daily, oxycodone  ER 10 Mg p.o. twice daily, Lidoderm  patch, as needed morphine . - Discharge patient home when pain is controlled. - TLSO advised, but not suitable for patient.   - PT OT input.    Paroxysmal A-fib (HCC): Continue apixaban  5 mg p.o. twice daily. Continue metoprolol  succinate 25 mg p.o. daily. Heart rate is controlled.   Hyperlipidemia Aortic atherosclerosis (HCC) Continue atorvastatin  40 mg p.o. daily.   Essential hypertension On metoprolol  as above. Blood pressure is controlled.  Type 2 diabetes mellitus without complication, without long-term current use of insulin  (HCC): Carbohydrate modified diet. CBG monitoring with RI SS. Weekly semaglutide . Last A1c done 02/13/2024 was 7.1%.   CKD (chronic kidney disease), stage III (HCC) -Stable. - Serum creatinine of 1.2. - Continue to monitor renal function and electrolytes.   Normocytic anemia -Continue to monitor closely. - Likely multifactorial.     DVT prophylaxis: Apixaban . Code Status: Full code. Family Communication:  Disposition Plan: Inpatient.   Consultants:  Infectious disease. Neurosurgery  Procedures:  None.  Antimicrobials:  Continuous ampicillin  pump.   Subjective: Back pain is improving slowly.  Objective: Vitals:    05/31/24 1036 05/31/24 1420 05/31/24  2024 06/01/24 0455  BP: 110/66 113/63 129/65 120/71  Pulse: 71 76 73 72  Resp: 18 16 18 18   Temp: 98.2 F (36.8 C) 98.1 F (36.7 C) 98.8 F (37.1 C) 98.3 F (36.8 C)  TempSrc: Oral  Oral Oral  SpO2:  97% 98% 96%  Weight: 85.7 kg     Height: 6' 3 (1.905 m)       Intake/Output Summary (Last 24 hours) at 06/01/2024 1913 Last data filed at 06/01/2024 1837 Gross per 24 hour  Intake 1200 ml  Output 2900 ml  Net -1700 ml   Filed Weights   05/31/24 1036  Weight: 85.7 kg    Examination:  General exam: Appears calm.  Patient is pale. Respiratory system: Clear to auscultation. Respiratory effort normal. Cardiovascular system: S1 & S2 heard Gastrointestinal system: Abdomen is soft and nontender. Central nervous system: Alert and oriented.    Data Reviewed: I have personally reviewed following labs and imaging studies  CBC: Recent Labs  Lab 05/29/24 0002 05/31/24 0213  WBC 8.0 7.1  NEUTROABS 5.0  --   HGB 10.2* 9.4*  HCT 31.5* 29.9*  MCV 83.1 84.0  PLT 171 178   Basic Metabolic Panel: Recent Labs  Lab 05/29/24 0002 05/31/24 0213  NA 136 137  K 3.6 4.0  CL 103 102  CO2 23 24  GLUCOSE 135* 123*  BUN 12 14  CREATININE 0.93 1.20  CALCIUM  8.6* 8.7*   GFR: Estimated Creatinine Clearance: 65.5 mL/min (by C-G formula based on SCr of 1.2 mg/dL). Liver Function Tests: Recent Labs  Lab 05/31/24 0213  AST 10*  ALT 10  ALKPHOS 69  BILITOT 0.6  PROT 6.1*  ALBUMIN 2.5*   No results for input(s): LIPASE, AMYLASE in the last 168 hours. No results for input(s): AMMONIA in the last 168 hours. Coagulation Profile: No results for input(s): INR, PROTIME in the last 168 hours. Cardiac Enzymes: No results for input(s): CKTOTAL, CKMB, CKMBINDEX, TROPONINI in the last 168 hours. BNP (last 3 results) No results for input(s): PROBNP in the last 8760 hours. HbA1C: Recent Labs    05/31/24 0213  HGBA1C 7.1*    CBG: Recent Labs  Lab 05/31/24 1659 05/31/24 2057 06/01/24 0827 06/01/24 1233 06/01/24 1628  GLUCAP 103* 197* 234* 111* 172*   Lipid Profile: No results for input(s): CHOL, HDL, LDLCALC, TRIG, CHOLHDL, LDLDIRECT in the last 72 hours. Thyroid  Function Tests: No results for input(s): TSH, T4TOTAL, FREET4, T3FREE, THYROIDAB in the last 72 hours. Anemia Panel: No results for input(s): VITAMINB12, FOLATE, FERRITIN, TIBC, IRON, RETICCTPCT in the last 72 hours. Urine analysis:    Component Value Date/Time   COLORURINE YELLOW 03/27/2024 1406   APPEARANCEUR HAZY (A) 03/27/2024 1406   LABSPEC 1.017 03/27/2024 1406   PHURINE 5.0 03/27/2024 1406   GLUCOSEU 150 (A) 03/27/2024 1406   GLUCOSEU 100 (A) 02/27/2023 1547   HGBUR SMALL (A) 03/27/2024 1406   HGBUR negative 12/07/2009 0811   BILIRUBINUR NEGATIVE 03/27/2024 1406   BILIRUBINUR neg 04/29/2021 1443   KETONESUR NEGATIVE 03/27/2024 1406   PROTEINUR 100 (A) 03/27/2024 1406   UROBILINOGEN 0.2 02/27/2023 1547   NITRITE POSITIVE (A) 03/27/2024 1406   LEUKOCYTESUR MODERATE (A) 03/27/2024 1406   Sepsis Labs: @LABRCNTIP (procalcitonin:4,lacticidven:4)  ) Recent Results (from the past 240 hours)  Culture, blood (Routine X 2) w Reflex to ID Panel     Status: None (Preliminary result)   Collection Time: 05/31/24  4:38 AM   Specimen: BLOOD RIGHT ARM  Result Value  Ref Range Status   Specimen Description   Final    BLOOD RIGHT ARM Performed at [facility], [address], [place], [place] [address]    Special Requests   Final    BOTTLES DRAWN AEROBIC ONLY Blood Culture results may not be optimal due to an inadequate volume of blood received in culture bottles Performed at [facility], [address] [place], [place] [address]    Culture   Final    NO GROWTH 1 DAY Performed at [facility], [address] [place], [place] [address]    Report Status PENDING   Incomplete  Culture, blood (Routine X 2) w Reflex to ID Panel     Status: None (Preliminary result)   Collection Time: 05/31/24  4:44 AM   Specimen: BLOOD LEFT HAND  Result Value Ref Range Status   Specimen Description   Final    BLOOD LEFT HAND Performed at [facility], [address] [place], [place] [address]    Special Requests   Final    BOTTLES DRAWN AEROBIC ONLY Blood Culture results may not be optimal due to an inadequate volume of blood received in culture bottles Performed at [facility], [address], [place], [place] [address]    Culture   Final    NO GROWTH 1 DAY Performed at [facility], [address] [place], [place] [address]    Report Status PENDING  Incomplete         Radiology Studies: No results found.       Scheduled Meds:  apixaban   5 mg Oral BID   atorvastatin   40 mg Oral QHS   Chlorhexidine  Gluconate Cloth  6 each Topical Daily   feeding supplement  237 mL Oral BID BM   gabapentin   300 mg Oral TID   insulin  aspart  0-15 Units Subcutaneous TID WC   metoprolol  succinate  25 mg Oral QHS   oxyCODONE   10 mg Oral Q12H   sodium chloride  flush  10-40 mL Intracatheter Q12H   Continuous Infusions:  ampicillin  (OMNIPEN) IV 2 g (06/01/24 1544)     LOS: 2 days    Time spent: 35 minutes.    Leatrice Chapel, MD  [facility] Pager #: [phone] 7PM-7AM contact night coverage as above

## 2024-06-01 NOTE — Plan of Care (Signed)
  Problem: Education: Goal: Knowledge of the procedure and recovery process will improve Outcome: Progressing   Problem: Bowel/Gastric: Goal: Gastrointestinal status for postoperative course will improve Outcome: Progressing   

## 2024-06-01 NOTE — Plan of Care (Signed)
  Problem: Education: Goal: Knowledge of the procedure and recovery process will improve Outcome: Progressing   Problem: Bowel/Gastric: Goal: Gastrointestinal status for postoperative course will improve Outcome: Progressing   Problem: Pain Management: Goal: General experience of comfort will improve Outcome: Progressing   Problem: Urinary Elimination: Goal: Ability to avoid or minimize complications of infection will improve Outcome: Progressing Goal: Ability to achieve and maintain urine output will improve Outcome: Progressing Goal: Home care management will improve Outcome: Progressing   Problem: Education: Goal: Knowledge of General Education information will improve Description: Including pain rating scale, medication(s)/side effects and non-pharmacologic comfort measures Outcome: Progressing   Problem: Health Behavior/Discharge Planning: Goal: Ability to manage health-related needs will improve Outcome: Progressing   Problem: Clinical Measurements: Goal: Ability to maintain clinical measurements within normal limits will improve Outcome: Progressing Goal: Will remain free from infection Outcome: Progressing Goal: Diagnostic test results will improve Outcome: Progressing Goal: Respiratory complications will improve Outcome: Progressing Goal: Cardiovascular complication will be avoided Outcome: Progressing   Problem: Activity: Goal: Risk for activity intolerance will decrease Outcome: Progressing   Problem: Nutrition: Goal: Adequate nutrition will be maintained Outcome: Progressing   Problem: Coping: Goal: Level of anxiety will decrease Outcome: Progressing   Problem: Elimination: Goal: Will not experience complications related to bowel motility Outcome: Progressing Goal: Will not experience complications related to urinary retention Outcome: Progressing   Problem: Pain Managment: Goal: General experience of comfort will improve and/or be  controlled Outcome: Progressing   Problem: Safety: Goal: Ability to remain free from injury will improve Outcome: Progressing   Problem: Skin Integrity: Goal: Risk for impaired skin integrity will decrease Outcome: Progressing   Problem: Education: Goal: Ability to describe self-care measures that may prevent or decrease complications (Diabetes Survival Skills Education) will improve Outcome: Progressing Goal: Individualized Educational Video(s) Outcome: Progressing   Problem: Coping: Goal: Ability to adjust to condition or change in health will improve Outcome: Progressing   Problem: Fluid Volume: Goal: Ability to maintain a balanced intake and output will improve Outcome: Progressing   Problem: Health Behavior/Discharge Planning: Goal: Ability to identify and utilize available resources and services will improve Outcome: Progressing Goal: Ability to manage health-related needs will improve Outcome: Progressing   Problem: Metabolic: Goal: Ability to maintain appropriate glucose levels will improve Outcome: Progressing   Problem: Nutritional: Goal: Maintenance of adequate nutrition will improve Outcome: Progressing Goal: Progress toward achieving an optimal weight will improve Outcome: Progressing   Problem: Skin Integrity: Goal: Risk for impaired skin integrity will decrease Outcome: Progressing   Problem: Tissue Perfusion: Goal: Adequacy of tissue perfusion will improve Outcome: Progressing

## 2024-06-01 NOTE — Progress Notes (Signed)
 Orthopedic Tech Progress Note Patient Details:  Robert [name] April 06, 1951 [id number]  Patient ID: [name] [name], male   DOB: 10/23/1951, 73 y.o.   MRN: [id number] Called [facility] to come assess pt. For some type of spinal support brace that will accommodate urostomy opening. [name] 06/01/2024, 2:09 PM

## 2024-06-01 NOTE — Evaluation (Addendum)
 Physical Therapy Evaluation Patient Details Name: MCCOY TESTA MRN: [id number] DOB: 1951-08-26 Today's Date: 06/01/2024  History of Present Illness  73 y.o. male who presented with worsening back pain. Lumbar spine MRI done on 05/30/2024 revealed:  1. Progressive collapse of the L5 vertebral body with diffuse edema and  enhancement in the L4 and L5 vertebral bodies, and progressive marrow signal change and enhancement within the S1 vertebral body. progressive disc osteomyelitis at L4-5 and progressively into the S1 vertebral body. ID consulted. PMH: afib,  bladder CA, s/p robotic right nephrectomy, right ureterectomy, radical cystectomy and excision of prostatic urethra and pelvic lymph node dissection on 12/08/2023,  urinary tract infection in April 2025,  readmission to the hospital in May for AKI, stent placement in the left ureter,  port removal and appropriate antibiotic treatment for 2 weeks with a negative TEE, readmission to the hospital 03/27/2024 until 04/03/2024 for Enterococcus bacteremia, worsening back pain in July and readmission 7/18 to 05/15/2024 for back pain and MRI showed L4-L5 discitis and osteomyelitis with L5-S1 discitis and epidural phlegmon and he underwent a disc aspiration on 05/12/2024 which was E faecalis,  PICC placement.  Clinical Impression  Pt admitted with above diagnosis.  Pt is limited by pain this date, unable to come to full sitting position d/t low back pain. Educated on log roll technique however pt has difficulty adhering to precautions. OOB attempts deferred d/t no T/LSO and severe pain. Noted MD progress notes state TLSO, PT order states LSO. Pt tells PT he had an appointment with [facility] however ended up back in hospital. Pt has urostomy which could potentially interfere with bracing--ortho tech updated and to follow up with [facility]. RN updated as well.  Patient may benefit from continued inpatient follow up therapy, <3 hours/day post acute given immobility, pain  and multiple recent admissions   Pt currently with functional limitations due to the deficits listed below (see PT Problem List). Pt will benefit from acute skilled PT to increase their independence and safety with mobility to allow discharge.           If plan is discharge home, recommend the following: Help with stairs or ramp for entrance;Two people to help with walking and/or transfers;Two people to help with bathing/dressing/bathroom   Can travel by private vehicle   No    Equipment Recommendations None recommended by PT  Recommendations for Other Services       Functional Status Assessment Patient has had a recent decline in their functional status and demonstrates the ability to make significant improvements in function in a reasonable and predictable amount of time.     Precautions / Restrictions Precautions Precautions: Fall;Back Precaution/Restrictions Comments: TLSO was recommended by NS, TLSO in medical MD progress notes, states LSO in PT Eval order--? will f/u with MD. per pt he had appt with [facility] 8/8 but was here in hospital. Pt has urostomy so not sure if the will be able to do off the shelf TLSO Restrictions Weight Bearing Restrictions Per Provider Order: No      Mobility  Bed Mobility Overal bed mobility: Needs Assistance Bed Mobility: Rolling, Sidelying to Sit, Sit to Sidelying Rolling: Min assist, Mod assist Sidelying to sit: Mod assist     Sit to sidelying: Mod assist General bed mobility comments: cues for log roll, pt has difficulty adhering to log roll/back precautions d/t pain, assistance needed with LEs and trunk, incr time and difficulty maintaining upright d/t pain    Transfers  Ambulation/Gait                  Stairs            Wheelchair Mobility     Tilt Bed    Modified Rankin (Stroke Patients Only)       Balance Overall balance assessment: Needs assistance Sitting-balance support:  Feet supported, Bilateral upper extremity supported Sitting balance-Leahy Scale: Zero Sitting balance - Comments: pt is unable to maintain upright d/t pain       Standing balance comment: unable to attempt                             Pertinent Vitals/Pain Pain Assessment Pain Assessment: 0-10 Pain Score: 9  Pain Location: lower back Pain Descriptors / Indicators: Sore, Guarding, Grimacing Pain Intervention(s): Limited activity within patient's tolerance, Monitored during session, Premedicated before session, Repositioned    Home Living Family/patient expects to be discharged to:: Private residence Living Arrangements: Spouse/significant other Available Help at Discharge: Family;Available PRN/intermittently Type of Home: House Home Access: Stairs to enter   [employer] of Steps: 3   Home Layout: One level Home Equipment: Agricultural consultant (2 wheels) Additional Comments: pt is spouse's caregiver who has dementia, she is physically ind pt providing supv and memory cues; recently hired caregivers 24/7    Prior Function               Mobility Comments: amb with RW since last admission - was supposed to get HHPT, never started per pt ADLs Comments: pt reports ind     Extremity/Trunk Assessment   Upper Extremity Assessment Upper Extremity Assessment: Overall WFL for tasks assessed    Lower Extremity Assessment Lower Extremity Assessment: Generalized weakness (testing limited by pain)       Communication   Communication Communication: No apparent difficulties    Cognition Arousal: Alert Behavior During Therapy: WFL for tasks assessed/performed   PT - Cognitive impairments: No apparent impairments                         Following commands: Intact       Cueing       General Comments      Exercises     Assessment/Plan    PT Assessment Patient needs continued PT services  PT Problem List Decreased strength;Decreased  activity tolerance;Decreased mobility;Pain;Decreased balance       PT Treatment Interventions DME instruction;Therapeutic exercise;Gait training;Functional mobility training;Therapeutic activities;Patient/family education;Balance training    PT Goals (Current goals can be found in the Care Plan section)  Acute Rehab PT Goals Patient Stated Goal: less pain PT Goal Formulation: With patient Time For Goal Achievement: 06/15/24 Potential to Achieve Goals: Fair    Frequency Min 3X/week     Co-evaluation               AM-PAC PT 6 Clicks Mobility  Outcome Measure Help needed turning from your back to your side while in a flat bed without using bedrails?: A Lot Help needed moving from lying on your back to sitting on the side of a flat bed without using bedrails?: A Lot Help needed moving to and from a bed to a chair (including a wheelchair)?: Total Help needed standing up from a chair using your arms (e.g., wheelchair or bedside chair)?: Total Help needed to walk in hospital room?: Total Help needed climbing 3-5 steps with a railing? : Total  6 Click Score: 8    End of Session   Activity Tolerance: Patient tolerated treatment well Patient left: with call bell/phone within reach;in bed;with bed alarm set Nurse Communication: Mobility status PT Visit Diagnosis: Other abnormalities of gait and mobility (R26.89);Pain Pain - Right/Left: Left (both) Pain - part of body:  (back)    Time: 1209-1230 PT Time Calculation (min) (ACUTE ONLY): 21 min   Charges:   PT Evaluation $PT Eval Low Complexity: 1 Low   PT General Charges $$ ACUTE PT VISIT: 1 Visit         Jahzeel Poythress, PT  Acute Rehab Dept ([facility]) [phone]  06/01/2024   [facility] 06/01/2024, 1:22 PM

## 2024-06-02 DIAGNOSIS — M549 Dorsalgia, unspecified: Secondary | ICD-10-CM | POA: Diagnosis not present

## 2024-06-02 LAB — GLUCOSE, CAPILLARY
Glucose-Capillary: 131 mg/dL — ABNORMAL HIGH (ref 70–99)
Glucose-Capillary: 126 mg/dL — ABNORMAL HIGH (ref 70–99)
Glucose-Capillary: 164 mg/dL — ABNORMAL HIGH (ref 70–99)
Glucose-Capillary: 161 mg/dL — ABNORMAL HIGH (ref 70–99)

## 2024-06-02 MED ORDER — METHOCARBAMOL 1000 MG/10ML IJ SOLN
500.0000 mg | Freq: Once | INTRAMUSCULAR | Status: AC
  Administered 2024-06-02: 500 mg via INTRAVENOUS
  Filled 2024-06-02: qty 10

## 2024-06-02 NOTE — Progress Notes (Signed)
 PROGRESS NOTE  Robert [name]  DOB: [birth year]  PCP: Garald Karlynn GAILS, MD FMW:[id number]  DOA: 05/29/2024  LOS: 3 days  Hospital Day: 5  Brief narrative: Robert [name] is a 73 y.o. male with PMH significant for DM2, HTN, HLD, paroxysmal A-fib on Eliquis , CKD 3, h/o bladder cancer s/p chemotherapy and cystoprostatectomy, right nephroureterectomy and total urethrectomy.   In early July, patient had L4-L5 discitis/osteomyelitis with phlegmon due to Enterococcus faecalis and was on treatment with continuous ampicillin  infusion pump. 8/6, patient presented to the ED with worsening back pain.  In the ED, patient was hemodynamically stable Labs showed WBC count of 8,   Lumbar spine MRI showed 1. Progressive collapse of the L5 vertebral body with diffuse edema and enhancement in the L4 and L5 vertebral bodies, and progressive marrow signal change and enhancement within the S1 vertebral body. Findings are consistent with progressive disc osteomyelitis at L4-5 and progressively into the S1 vertebral body. 2. Extensive paraspinous soft tissue enhancement and ventral epidural enhancement at L4 and L5. 3. Stable lumbar disc disease at L1-2 and L2-3. 4. Broad-based disc protrusion with moderate central and right foraminal stenosis at L3-4, similar to the prior study.    Thoracic spine MRI showed: 1. No evidence of discitis or other acute pathology in the thoracic spine. 2. Shallow central disc protrusion at T8-9 without significant stenosis.  Admitted to [facility] Per prior documentation, case was discussed with neurosurgery and ID.  Recommended nonsurgical management with thoracolumbar brace and IV antibiotics  Subjective: Patient was seen and examined this morning Chart reviewed. Afebrile, hemodynamically stable. Most recent labs from 8/7.  No labs since then.  Assessment and plan: Progressive osteomyelitis after L4-L5  Presented with worsening back pain despite recent course  of antibiotics Imaging as above. ID and neurosurgery consulted.  Recommended nonsurgical management with thoracolumbar brace and IV antibiotics Currently on IV ampicillin  Patient unable to tolerate TLSO Pain regimen --- Scheduled: Gabapentin  300 mg 3 times daily, oxycodone  ER 10 mg twice daily, Lidoderm  patch --- PRN: morphine  IR 15 mg every 4 hours, IV morphine  4 hours Seen by PT OT.    Type 2 diabetes mellitus A1c 7.1 on 05/31/2024 PTA meds-weekly Ozempic  Continue SSI/Accu-Cheks Currently on carb modified diet Recent Labs  Lab 06/01/24 1233 06/01/24 1628 06/01/24 2117 06/02/24 0735 06/02/24 1237  GLUCAP 111* 172* 158* 131* 126*   Hypertension Blood pressure controlled on metoprolol    Paroxysmal A-fib Currently controlled on Toprol  25 mg daily Continue chronic anticoagulation with apixaban  5 mg p.o. twice daily.   Hyperlipidemia Aortic atherosclerosis Continue atorvastatin  40 mg p.o. daily.   CKD 2 Creatinine stable. Recent Labs    04/11/24 1302 04/23/24 1523 05/09/24 1327 05/10/24 1423 05/11/24 0446 05/12/24 0532 05/13/24 0622 05/15/24 0550 05/29/24 0002 05/31/24 0213  BUN 27* 24 19 16 14 15 15 17 12 14   CREATININE 1.31* 1.26 1.24 1.22 1.15 1.05 1.08 1.09 0.93 1.20   Chronic normocytic anemia Hemoglobin stable Recent Labs    05/12/24 0532 05/13/24 0622 05/15/24 0550 05/29/24 0002 05/31/24 0213  HGB 11.3* 11.2* 11.1* 10.2* 9.4*  MCV 81.6 82.4 81.5 83.1 84.0   Mobility:  PT Follow up Rec:  Skilled Nursing-Short Term Rehab (<3 Hours/Day)06/01/2024 1314   Goals of care   Code Status: Full Code    DVT prophylaxis:  apixaban  (ELIQUIS ) tablet 5 mg   Antimicrobials: IV ampicillin  Fluid: None Consultants: Neurosurgery and ID Family Communication: None at bedside  Status: Inpatient Level of care:  Telemetry   Patient is from: Home Needs to continue in-hospital care: On IV antibiotics, Anticipated d/c to: Pending SNF    Diet:  Diet Order              Diet heart healthy/carb modified Room service appropriate? Yes; Fluid consistency: Thin  Diet effective now                   Scheduled Meds:  apixaban   5 mg Oral BID   atorvastatin   40 mg Oral QHS   Chlorhexidine  Gluconate Cloth  6 each Topical Daily   feeding supplement  237 mL Oral BID BM   gabapentin   300 mg Oral TID   insulin  aspart  0-15 Units Subcutaneous TID WC   metoprolol  succinate  25 mg Oral QHS   oxyCODONE   10 mg Oral Q12H   sodium chloride  flush  10-40 mL Intracatheter Q12H    PRN meds: acetaminophen  **OR** acetaminophen , guaiFENesin -dextromethorphan , lidocaine , morphine , morphine  injection, ondansetron  **OR** ondansetron  (ZOFRAN ) IV, senna-docusate, sodium chloride  flush   Infusions:   ampicillin  (OMNIPEN) IV 2 g (06/02/24 1314)    Antimicrobials: Anti-infectives (From admission, onward)    Start     Dose/Rate Route Frequency Ordered Stop   05/31/24 0000  ampicillin  IVPB        12 g Intravenous Every 24 hours 05/31/24 1327 07/08/24 2359   05/30/24 1600  ampicillin   Status:  Discontinued       Note to Pharmacy: As a continuous infusion. Indication:  Discitis First Dose: Yes Last Day of Therapy:  06/10/24 Labs - Once weekly:  CBC/D and BMP, Labs - Once weekly: ESR and CRP Method of administration: Ambulatory Pump (Continuous Infusion)   12 g Intravenous Every 24 hours 05/30/24 1028 05/30/24 1246   05/30/24 1400  ampicillin  (OMNIPEN) 2 g in sodium chloride  0.9 % 100 mL IVPB        2 g 300 mL/hr over 20 Minutes Intravenous Every 4 hours 05/30/24 1246         Objective: Vitals:   06/02/24 0414 06/02/24 1307  BP: 114/70 107/64  Pulse: 68 62  Resp: 18 20  Temp: 97.9 F (36.6 C) 98 F (36.7 C)  SpO2: 99% 98%    Intake/Output Summary (Last 24 hours) at 06/02/2024 1417 Last data filed at 06/02/2024 1120 Gross per 24 hour  Intake 1200 ml  Output 3600 ml  Net -2400 ml   Filed Weights   05/31/24 1036  Weight: 85.7 kg   Weight  change:  Body mass index is 23.62 kg/m.   Physical Exam: General exam: Pleasant, elderly Caucasian male.  Pain tolerable but he is frequently repositioning himself to avoid pain Skin: No rashes, lesions or ulcers. HEENT: Atraumatic, normocephalic, no obvious bleeding Lungs: Clear to auscultation bilaterally,  CVS: S1, S2, no murmur,   GI/Abd: Soft, nontender, nondistended, bowel sound present,   CNS: Alert, awake, oriented x 3 Psychiatry: Sad affect Extremities: No pedal edema, no calf tenderness,   Data Review: I have personally reviewed the laboratory data and studies available.  F/u labs  Unresulted Labs (From admission, onward)     Start     Ordered   06/03/24 0500  Basic metabolic panel with GFR  Tomorrow morning,   R       Question:  Specimen collection method  Answer:  IV Team=IV Team collect   06/02/24 1417   06/03/24 0500  CBC with Differential/Platelet  Tomorrow morning,   R  Question:  Specimen collection method  Answer:  IV Team=IV Team collect   06/02/24 1417            Signed, Chapman Rota, MD [facility] 06/02/2024

## 2024-06-03 DIAGNOSIS — M549 Dorsalgia, unspecified: Secondary | ICD-10-CM | POA: Diagnosis not present

## 2024-06-03 LAB — CBC WITH DIFFERENTIAL/PLATELET
Abs Granulocyte: 4 K/uL (ref 1.5–6.5)
Abs Immature Granulocytes: 0.05 K/uL (ref 0.00–0.07)
Basophils Absolute: 0.1 K/uL (ref 0.0–0.1)
Basophils Relative: 1 %
Eosinophils Absolute: 0.1 K/uL (ref 0.0–0.5)
Eosinophils Relative: 1 %
HCT: 30.4 % — ABNORMAL LOW (ref 39.0–52.0)
Hemoglobin: 9.8 g/dL — ABNORMAL LOW (ref 13.0–17.0)
Immature Granulocytes: 1 %
Lymphocytes Relative: 24 %
Lymphs Abs: 1.5 K/uL (ref 0.7–4.0)
MCH: 26.7 pg (ref 26.0–34.0)
MCHC: 32.2 g/dL (ref 30.0–36.0)
MCV: 82.8 fL (ref 80.0–100.0)
Monocytes Absolute: 0.7 K/uL (ref 0.1–1.0)
Monocytes Relative: 11 %
Neutro Abs: 4 K/uL (ref 1.7–7.7)
Neutrophils Relative %: 62 %
Platelets: 177 K/uL (ref 150–400)
RBC: 3.67 MIL/uL — ABNORMAL LOW (ref 4.22–5.81)
RDW: 14.6 % (ref 11.5–15.5)
WBC: 6.3 K/uL (ref 4.0–10.5)
nRBC: 0 % (ref 0.0–0.2)

## 2024-06-03 LAB — GLUCOSE, CAPILLARY
Glucose-Capillary: 156 mg/dL — ABNORMAL HIGH (ref 70–99)
Glucose-Capillary: 191 mg/dL — ABNORMAL HIGH (ref 70–99)
Glucose-Capillary: 137 mg/dL — ABNORMAL HIGH (ref 70–99)
Glucose-Capillary: 163 mg/dL — ABNORMAL HIGH (ref 70–99)

## 2024-06-03 LAB — BASIC METABOLIC PANEL WITH GFR
Anion gap: 9 (ref 5–15)
BUN: 20 mg/dL (ref 8–23)
CO2: 27 mmol/L (ref 22–32)
Calcium: 8.9 mg/dL (ref 8.9–10.3)
Chloride: 100 mmol/L (ref 98–111)
Creatinine, Ser: 1.46 mg/dL — ABNORMAL HIGH (ref 0.61–1.24)
GFR, Estimated: 50 mL/min — ABNORMAL LOW (ref >60)
Glucose, Bld: 143 mg/dL — ABNORMAL HIGH (ref 70–99)
Potassium: 4.2 mmol/L (ref 3.5–5.1)
Sodium: 136 mmol/L (ref 135–145)

## 2024-06-03 MED ORDER — SODIUM CHLORIDE 0.9 % IV SOLN: INTRAVENOUS | Status: DC

## 2024-06-03 MED ORDER — HYDROMORPHONE HCL 2 MG PO TABS
4.0000 mg | ORAL_TABLET | ORAL | Status: DC | PRN
  Administered 2024-06-03 – 2024-06-09 (×17): 4 mg via ORAL
  Filled 2024-06-03 (×11): qty 2

## 2024-06-03 MED ORDER — HYDROMORPHONE HCL 1 MG/ML IJ SOLN
0.5000 mg | INTRAMUSCULAR | Status: DC | PRN
  Administered 2024-06-03 – 2024-06-12 (×36): 0.5 mg via INTRAVENOUS
  Filled 2024-06-03 (×28): qty 0.5

## 2024-06-03 MED ORDER — OXYCODONE HCL 5 MG PO TABS
5.0000 mg | ORAL_TABLET | Freq: Four times a day (QID) | ORAL | Status: DC | PRN
  Administered 2024-06-03 – 2024-06-12 (×15): 5 mg via ORAL
  Filled 2024-06-03 (×11): qty 1

## 2024-06-03 MED ORDER — OXYCODONE HCL ER 15 MG PO T12A
15.0000 mg | EXTENDED_RELEASE_TABLET | Freq: Two times a day (BID) | ORAL | Status: DC
  Administered 2024-06-03 – 2024-06-12 (×23): 15 mg via ORAL
  Filled 2024-06-03 (×18): qty 1

## 2024-06-03 NOTE — Plan of Care (Signed)
  Problem: Education: Goal: Knowledge of the procedure and recovery process will improve Outcome: Progressing   Problem: Clinical Measurements: Goal: Diagnostic test results will improve Outcome: Progressing   Problem: Activity: Goal: Risk for activity intolerance will decrease Outcome: Progressing   Problem: Safety: Goal: Ability to remain free from injury will improve Outcome: Progressing   Problem: Pain Managment: Goal: General experience of comfort will improve and/or be controlled Outcome: Progressing

## 2024-06-03 NOTE — Progress Notes (Signed)
 PT Cancellation Note  Patient Details Name: Robert [name] MRN: [id number] DOB: [birth year]   Cancelled Treatment:    Reason Eval/Treat Not Completed: Pain limiting ability to participate Attempted mobility at 1030, patient declined due to pain. Will attempt again if pain is more managed. Patient clearly noted with  pain when he was adjusting the bed head. [name] PT Acute Rehabilitation Services Office [phone]   [name] 06/03/2024, 1:29 PM

## 2024-06-03 NOTE — TOC Progression Note (Signed)
 Transition of Care [facility]) - Progression Note    Patient Details  Name: Robert [name] MRN: [id number] Date of Birth: 22-May-1951  Transition of Care [facility]) CM/SW Contact  Heather DELENA Saltness, LCSW Phone Number: 06/03/2024, 5:16 PM  Clinical Narrative:    CSW met with pt at bedside to discuss discharge planning and recommendation for short-term rehab at [facility]. Pt reports being open to SNF rehab. Pt inquired the difference between SNF and [facility] services. CSW explained that [facility] PT/OT services are done in the home, usually 2-3x per week whereas SNF PT/OT is done 5x per week at a SNF. CSW completed FL2 and faxed referrals to facilities in [place] and surrounding areas. TOC will continue to follow and assist with SNF placement.   Expected Discharge Plan: Home w Home Health Services Barriers to Discharge: Continued Medical Work up   Expected Discharge Plan and Services In-house Referral: NA Discharge Planning Services: CM Consult Post Acute Care Choice: Home Health Living arrangements for the past 2 months: Single Family Home                 DME Arranged: N/A DME Agency: NA       HH Arranged: RN, IV Antibiotics HH Agency: [facility] Date HH Agency Contacted: 05/31/24 Time HH Agency Contacted: [phone] Representative spoke with at [facility] Agency: Amy   Social Drivers of Health (SDOH) Interventions SDOH Screenings   Food Insecurity: No Food Insecurity (05/30/2024)  Housing: High Risk (05/30/2024)  Transportation Needs: No Transportation Needs (05/30/2024)  Utilities: Not At Risk (05/30/2024)  Alcohol  Screen: Low Risk  (04/08/2024)  Depression (PHQ2-9): Low Risk  (05/09/2024)  Financial Resource Strain: Low Risk  (04/08/2024)  Physical Activity: Inactive (04/08/2024)  Social Connections: Moderately Integrated (05/31/2024)  Recent Concern: Social Connections - Moderately Isolated (03/28/2024)  Stress: Stress Concern Present (04/08/2024)  Tobacco Use: High Risk (05/31/2024)  Health Literacy:  Adequate Health Literacy (04/08/2024)    Readmission Risk Interventions    04/01/2024    2:58 PM 02/07/2024    2:07 PM 12/10/2023    4:50 PM  Readmission Risk Prevention Plan  Transportation Screening Complete Complete Complete  PCP or Specialist Appt within 5-7 Days   Complete  PCP or Specialist Appt within 3-5 Days Complete Complete   Home Care Screening   Complete  Medication Review (RN CM)   Complete  HRI or Home Care Consult Complete Complete   Social Work Consult for Recovery Care Planning/Counseling Complete Complete   Palliative Care Screening Not Applicable Not Applicable   Medication Review Oceanographer) Complete Referral to Pharmacy     Signed: Heather Saltness, MSW, LCSW Clinical Social Worker Inpatient Care Management 06/03/2024 5:26 PM

## 2024-06-03 NOTE — [place] FL2 (Signed)
 [place]  MEDICAID FL2 LEVEL OF CARE FORM     IDENTIFICATION  Patient Name: Robert [name] Birthdate: [birth year] Sex: male Admission Date (Current Location): 05/29/2024  [facility] and [place] Number:      Facility and Address:  [facility],  [address], [place] [address]      Provider Number: [id number]  Attending Physician Name and Address:  Arlice Reichert, MD  Relative Name and Phone Number:  Nizar, [facility])  [phone]    Current Level of Care: Hospital Recommended Level of Care: Skilled Nursing Facility Prior Approval Number:    Date Approved/Denied:   PASRR Number: [id number]  Discharge Plan: SNF    Current Diagnoses: Patient Active Problem List   Diagnosis Date Noted   Intractable back pain 05/30/2024   Normocytic anemia 05/30/2024   Vertebral osteomyelitis (HCC) 05/30/2024   Aortic atherosclerosis (HCC) 05/30/2024   Diskitis 05/10/2024   Low back pain 04/17/2024   Lumbar radiculopathy 03/28/2024   Hyponatremia 03/28/2024   CKD (chronic kidney disease), stage III (HCC) 03/28/2024   H/O left nephrectomy 03/19/2024   Neuropathy 03/19/2024   AKI (acute kidney injury) (HCC) 03/01/2024   Serratia marcescens infection 03/01/2024   Chills 02/29/2024   Medication management 02/29/2024   Hydronephrosis of left kidney 02/05/2024   Complicated UTI (urinary tract infection) 02/05/2024   Bacteremia 02/04/2024   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  10/09/2023   Hypomagnesemia 07/03/2023   Port-A-Cath in place 06/09/2023   Elevated PSA measurement 02/28/2023   Preop examination 02/27/2023   Osteoarthritis of right hip 06/29/2022   Bladder cancer (HCC) 01/31/2022   Primary osteoarthritis of right hip 10/19/2021   Long term current use of anticoagulant -Eliquis  03/12/2019   Paroxysmal A-fib (HCC) 03/12/2019   Overweight (BMI 25.0-29.9) 01/03/2019   Type 2 diabetes mellitus without complication, without long-term current use of insulin  (HCC)  07/23/2018   BPH associated with nocturia 03/01/2017   Irregular heart rate 06/23/2011   Hx of colonic polyps    ERECTILE DYSFUNCTION 12/19/2008   SKIN CANCER, HX OF 12/19/2008   Hyperlipidemia 08/21/2007   Essential hypertension 08/21/2007    Orientation RESPIRATION BLADDER Height & Weight     Self, Time, Situation, Place  Normal Continent, Urostomy Weight: 189 lb (85.7 kg) Height:  6' 3 (190.5 cm)  BEHAVIORAL SYMPTOMS/MOOD NEUROLOGICAL BOWEL NUTRITION STATUS      Continent Diet (Heart Healthy/Carb Modified)  AMBULATORY STATUS COMMUNICATION OF NEEDS Skin   Extensive Assist Verbally Normal                       Personal Care Assistance Level of Assistance  Bathing, Feeding, Dressing Bathing Assistance: Maximum assistance Feeding assistance: Independent Dressing Assistance: Maximum assistance     Functional Limitations Info  Sight, Hearing, Speech Sight Info: Impaired (eyeglasses) Hearing Info: Adequate Speech Info: Adequate    SPECIAL CARE FACTORS FREQUENCY  PT (By licensed PT), OT (By licensed OT)     PT Frequency: 5x per week OT Frequency: 5x per week            Contractures Contractures Info: Not present    Additional Factors Info  Code Status, Allergies Code Status Info: FULL Allergies Info: Lisinopril , Lyrica  (Pregabalin )           Current Medications (06/03/2024):  This is the current hospital active medication list Current Facility-Administered Medications  Medication Dose Route Frequency Provider Last Rate Last Admin   0.9 %  sodium chloride  infusion   Intravenous Continuous Dahal,  Chapman, MD 75 mL/hr at 06/03/24 0821 New Bag at 06/03/24 9178   acetaminophen  (TYLENOL ) tablet 650 mg  650 mg Oral Q6H PRN Celinda Alm Lot, MD       Or   acetaminophen  (TYLENOL ) suppository 650 mg  650 mg Rectal Q6H PRN Celinda Alm Lot, MD       ampicillin  (OMNIPEN) 2 g in sodium chloride  0.9 % 100 mL IVPB  2 g Intravenous Q4H Celinda Alm Lot, MD 300  mL/hr at 06/03/24 1622 2 g at 06/03/24 1622   apixaban  (ELIQUIS ) tablet 5 mg  5 mg Oral BID Celinda Alm Lot, MD   5 mg at 06/03/24 9082   atorvastatin  (LIPITOR) tablet 40 mg  40 mg Oral QHS Celinda Alm Lot, MD   40 mg at 06/02/24 2023   Chlorhexidine  Gluconate Cloth 2 % PADS 6 each  6 each Topical Daily Celinda Alm Lot, MD   6 each at 06/03/24 0918   feeding supplement (ENSURE PLUS HIGH PROTEIN) liquid 237 mL  237 mL Oral BID BM Rosario Eland I, MD   237 mL at 06/03/24 9081   gabapentin  (NEURONTIN ) capsule 300 mg  300 mg Oral TID Celinda Alm Lot, MD   300 mg at 06/03/24 1617   guaiFENesin -dextromethorphan  (ROBITUSSIN DM) 100-10 MG/5ML syrup 5 mL  5 mL Oral Q4H PRN Chavez, Abigail, NP   5 mL at 06/01/24 2001   HYDROmorphone  (DILAUDID ) injection 0.5 mg  0.5 mg Intravenous Q4H PRN Dahal, Chapman, MD   0.5 mg at 06/03/24 1350   HYDROmorphone  (DILAUDID ) tablet 4 mg  4 mg Oral Q4H PRN Dahal, Chapman, MD       insulin  aspart (novoLOG ) injection 0-15 Units  0-15 Units Subcutaneous TID WC Celinda Alm Lot, MD   3 Units at 06/03/24 1256   lidocaine  (LIDODERM ) 5 % 1 patch  1 patch Transdermal Daily PRN Celinda Alm Lot, MD   1 patch at 06/02/24 1003   metoprolol  succinate (TOPROL -XL) 24 hr tablet 25 mg  25 mg Oral QHS Celinda Alm Lot, MD   25 mg at 06/02/24 2131   ondansetron  (ZOFRAN ) tablet 4 mg  4 mg Oral Q6H PRN Celinda Alm Lot, MD       Or   ondansetron  (ZOFRAN ) injection 4 mg  4 mg Intravenous Q6H PRN Celinda Alm Lot, MD       oxyCODONE  (Oxy IR/ROXICODONE ) immediate release tablet 5 mg  5 mg Oral Q6H PRN Arlice Chapman, MD   5 mg at 06/03/24 1617   oxyCODONE  (OXYCONTIN ) 12 hr tablet 15 mg  15 mg Oral Q12H Dahal, Chapman, MD       senna-docusate (Senokot-S) tablet 1-2 tablet  1-2 tablet Oral BID BM PRN Celinda Alm Lot, MD   2 tablet at 06/03/24 1620   sodium chloride  flush (NS) 0.9 % injection 10-40 mL  10-40 mL Intracatheter Q12H Ortiz, David Manuel, MD   10 mL at  06/03/24 9178   sodium chloride  flush (NS) 0.9 % injection 10-40 mL  10-40 mL Intracatheter PRN Celinda Alm Lot, MD       Facility-Administered Medications Ordered in Other Encounters  Medication Dose Route Frequency Provider Last Rate Last Admin   sodium chloride  flush (NS) 0.9 % injection 10 mL  10 mL Intracatheter PRN Lanny Callander, MD   10 mL at 02/15/24 1353     Discharge Medications: Please see discharge summary for a list of discharge medications.  Relevant Imaging Results:  Relevant Lab Results:   Additional Information  SSN: [id number]  Heather DELENA Saltness, LCSW

## 2024-06-03 NOTE — Plan of Care (Signed)
  Problem: Education: Goal: Knowledge of the procedure and recovery process will improve Outcome: Progressing   Problem: Bowel/Gastric: Goal: Gastrointestinal status for postoperative course will improve Outcome: Progressing   Problem: Pain Management: Goal: General experience of comfort will improve Outcome: Progressing   Problem: Skin Integrity: Goal: Demonstration of wound healing without infection will improve Outcome: Progressing   Problem: Urinary Elimination: Goal: Ability to avoid or minimize complications of infection will improve Outcome: Progressing Goal: Ability to achieve and maintain urine output will improve Outcome: Progressing Goal: Home care management will improve Outcome: Progressing   Problem: Education: Goal: Knowledge of General Education information will improve Description: Including pain rating scale, medication(s)/side effects and non-pharmacologic comfort measures Outcome: Progressing   Problem: Health Behavior/Discharge Planning: Goal: Ability to manage health-related needs will improve Outcome: Progressing   Problem: Clinical Measurements: Goal: Ability to maintain clinical measurements within normal limits will improve Outcome: Progressing Goal: Will remain free from infection Outcome: Progressing Goal: Diagnostic test results will improve Outcome: Progressing Goal: Respiratory complications will improve Outcome: Progressing Goal: Cardiovascular complication will be avoided Outcome: Progressing   Problem: Activity: Goal: Risk for activity intolerance will decrease Outcome: Progressing   Problem: Nutrition: Goal: Adequate nutrition will be maintained Outcome: Progressing   Problem: Coping: Goal: Level of anxiety will decrease Outcome: Progressing   Problem: Elimination: Goal: Will not experience complications related to bowel motility Outcome: Progressing Goal: Will not experience complications related to urinary  retention Outcome: Progressing   Problem: Pain Managment: Goal: General experience of comfort will improve and/or be controlled Outcome: Progressing   Problem: Safety: Goal: Ability to remain free from injury will improve Outcome: Progressing   Problem: Skin Integrity: Goal: Risk for impaired skin integrity will decrease Outcome: Progressing   Problem: Education: Goal: Ability to describe self-care measures that may prevent or decrease complications (Diabetes Survival Skills Education) will improve Outcome: Progressing Goal: Individualized Educational Video(s) Outcome: Progressing   Problem: Coping: Goal: Ability to adjust to condition or change in health will improve Outcome: Progressing   Problem: Fluid Volume: Goal: Ability to maintain a balanced intake and output will improve Outcome: Progressing   Problem: Health Behavior/Discharge Planning: Goal: Ability to identify and utilize available resources and services will improve Outcome: Progressing Goal: Ability to manage health-related needs will improve Outcome: Progressing   Problem: Metabolic: Goal: Ability to maintain appropriate glucose levels will improve Outcome: Progressing   Problem: Nutritional: Goal: Maintenance of adequate nutrition will improve Outcome: Progressing Goal: Progress toward achieving an optimal weight will improve Outcome: Progressing   Problem: Skin Integrity: Goal: Risk for impaired skin integrity will decrease Outcome: Progressing   Problem: Tissue Perfusion: Goal: Adequacy of tissue perfusion will improve Outcome: Progressing

## 2024-06-03 NOTE — Progress Notes (Signed)
 Physical Therapy Treatment Patient Details Name: Robert [name] MRN: [id number] DOB: 08-Oct-1951 Today's Date: 06/03/2024   History of Present Illness 73 y.o. male who presents today with worsening back pain. Lumbar spine MRI done on 05/30/2024 revealed:  1. Progressive collapse of the L5 vertebral body with diffuse edema and  enhancement in the L4 and L5 vertebral bodies, and progressive marrow signal change and enhancement within the S1 vertebral body. progressive disc osteomyelitis at L4-5 and progressively into the S1 vertebral body.  PMH: afib,  bladder CA, s/p robotic right nephrectomy, right ureterectomy, radical cystectomy and excision of prostatic urethra and pelvic lymph node dissection on 12/08/2023,  urinary tract infection in April 2025,  readmission to the hospital in May for AKI, stent placement in the left ureter,  port removal and appropriate antibiotic treatment for 2 weeks with a negative TEE, readmission to the hospital 03/27/2024 until 04/03/2024 for Enterococcus bacteremia, worsening back pain in July and readmission 7/18 to 05/15/2024 for back pain and MRI showed L4-L5 discitis and osteomyelitis with L5-S1 discitis and epidural phlegmon and he underwent a disc aspiration on 05/12/2024 which was E faecalis,  PICC placement.    PT Comments   Patient premedicated with IV medication 30 prior. Patient   reporting intermittent 10/10 pain, depending on position in bed , stating the more upright the more pain. Patient in constant motion, repositioning self constantly.  Assisted placing bed in partial bed chair position and tolerated x ~ 5 minutes at ~ 60* HOB, also tilted bed down in reverse trendelenburg to  increase pressure  in more upright, Bed only tilts to ~ 45*  Patient can benefit from a scheduled pain medication regimen to hopefully improve ain control and allow increased mobility. Per ortho tech, [employer] has measured pt for a custom brace.  Patient will benefit from continued  inpatient follow up therapy, <3 hours/day                  tried   If plan is discharge home, recommend the following: Help with stairs or ramp for entrance;Two people to help with walking and/or transfers;Two people to help with bathing/dressing/bathroom   Can travel by private vehicle        Equipment Recommendations  None recommended by PT    Recommendations for Other Services       Precautions / Restrictions Precautions Precautions: Fall;Back Precaution/Restrictions Comments: TLSO was recommended by NS,[employer] tried LSO but unable to keep it on due to urostomy. ? [employer] to come back for a  better fit. Restrictions Weight Bearing Restrictions Per Provider Order: No     Mobility  Bed Mobility               General bed mobility comments: patient  constantly moving in bed, rolling to side,raising  HOB  and lowering. adjusting position usiong rails.Assisted with tilt  bed in reverse tremdelenberg position, able to get some wieght but limited  tilt. Placed in  bed chair position for a short time with HOB at ~ 60*,    Transfers                        Ambulation/Gait                   Stairs             Wheelchair Mobility     Tilt Bed    Modified Rankin (Stroke Patients Only)  Balance       Sitting balance - Comments: pt is unable to maintain upright d/t pain                                    Communication Communication Communication: No apparent difficulties  Cognition Arousal: Alert Behavior During Therapy: WFL for tasks assessed/performed, Anxious, Restless   PT - Cognitive impairments: No apparent impairments                         Following commands: Intact      Cueing    Exercises      General Comments        Pertinent Vitals/Pain Pain Assessment Pain Score: 10-Worst pain ever Pain Location: lower back Pain Descriptors / Indicators: Sore, Guarding,  Grimacing Pain Intervention(s): Premedicated before session, Limited activity within patient's tolerance, Monitored during session (IV meds only)    Home Living                          Prior Function            PT Goals (current goals can now be found in the care plan section) Progress towards PT goals: Progressing toward goals    Frequency    Min 3X/week      PT Plan      Co-evaluation              AM-PAC PT 6 Clicks Mobility   Outcome Measure  Help needed turning from your back to your side while in a flat bed without using bedrails?: A Lot Help needed moving from lying on your back to sitting on the side of a flat bed without using bedrails?: Total Help needed moving to and from a bed to a chair (including a wheelchair)?: Total Help needed standing up from a chair using your arms (e.g., wheelchair or bedside chair)?: Total Help needed to walk in hospital room?: Total Help needed climbing 3-5 steps with a railing? : Total 6 Click Score: 7    End of Session   Activity Tolerance: Patient limited by pain Patient left: with call bell/phone within reach;in bed Nurse Communication: Mobility status PT Visit Diagnosis: Other abnormalities of gait and mobility (R26.89);Pain Pain - Right/Left: Left Pain - part of body:  (back)     Time: 1420-1501 PT Time Calculation (min) (ACUTE ONLY): 41 min  Charges:    $Therapeutic Activity: 38-52 mins PT General Charges $$ ACUTE PT VISIT: 1 Visit                     [name] PT Acute Rehabilitation Services Office [phone]    [name] 06/03/2024, 3:56 PM

## 2024-06-03 NOTE — Progress Notes (Signed)
 PROGRESS NOTE  Robert [name]  DOB: 1951-06-23  PCP: Garald Karlynn GAILS, MD FMW:[id number]  DOA: 05/29/2024  LOS: 4 days  Hospital Day: 6  Brief narrative: Robert [name] is a 73 y.o. male with PMH significant for DM2, HTN, HLD, paroxysmal A-fib on Eliquis , CKD 3, h/o bladder cancer s/p chemotherapy and cystoprostatectomy, right nephroureterectomy and total urethrectomy.   In early July, patient had L4-L5 discitis/osteomyelitis with phlegmon due to Enterococcus faecalis and was on treatment with continuous ampicillin  infusion pump. 8/6, patient presented to the ED with worsening back pain.  In the ED, patient was hemodynamically stable Labs showed WBC count of 8,   Lumbar spine MRI showed 1. Progressive collapse of the L5 vertebral body with diffuse edema and enhancement in the L4 and L5 vertebral bodies, and progressive marrow signal change and enhancement within the S1 vertebral body. Findings are consistent with progressive disc osteomyelitis at L4-5 and progressively into the S1 vertebral body. 2. Extensive paraspinous soft tissue enhancement and ventral epidural enhancement at L4 and L5. 3. Stable lumbar disc disease at L1-2 and L2-3. 4. Broad-based disc protrusion with moderate central and right foraminal stenosis at L3-4, similar to the prior study.    Thoracic spine MRI showed: 1. No evidence of discitis or other acute pathology in the thoracic spine. 2. Shallow central disc protrusion at T8-9 without significant stenosis.  Admitted to [facility] Per prior documentation, case was discussed with neurosurgery and ID.  Recommended nonsurgical management with thoracolumbar brace and IV antibiotics  Subjective: Patient was seen and examined this morning. Lying down in bed.  Slept well last night.  In pain this morning.  Also waiting for PT this morning.  Assessment and plan: Progressive osteomyelitis after L4-L5  Presented with worsening back pain despite recent course  of antibiotics Imaging as above. ID and neurosurgery consulted.  Recommended nonsurgical management with thoracolumbar brace and IV antibiotics Currently on IV ampicillin  Patient unable to tolerate TLSO I increased OxyContin  from 10 mg to 15 mg daily today.  Also switched from morphine  to Dilaudid  because of AKI Pain regimen --- Scheduled: OxyContin  15 mg twice daily, gabapentin  300 mg 3 times daily, Lidoderm  patch --- PRN: Dilaudid  4 mg every 4 hours, IV Dilaudid  0.5 mg 4 hours Seen by PT OT.    Type 2 diabetes mellitus A1c 7.1 on 05/31/2024 PTA meds-weekly Ozempic  Continue SSI/Accu-Cheks Currently on carb modified diet Recent Labs  Lab 06/02/24 0735 06/02/24 1237 06/02/24 1642 06/02/24 2118 06/03/24 0816  GLUCAP 131* 126* 164* 161* 156*   Hypertension Blood pressure controlled on metoprolol    Paroxysmal A-fib Currently controlled on Toprol  25 mg daily Continue chronic anticoagulation with apixaban  5 mg p.o. twice daily.   Hyperlipidemia Aortic atherosclerosis Continue atorvastatin  40 mg p.o. daily.   AKI on CKD 2 Creatinine worsened to 1.46 today against a baseline of normal.  Looks dry clinically.  Start on NS at 75 mL/h.  Switched from morphine  to Dilaudid  as needed. Recent Labs    04/23/24 1523 05/09/24 1327 05/10/24 1423 05/11/24 0446 05/12/24 0532 05/13/24 0622 05/15/24 0550 05/29/24 0002 05/31/24 0213 06/03/24 0215  BUN 24 19 16 14 15 15 17 12 14 20   CREATININE 1.26 1.24 1.22 1.15 1.05 1.08 1.09 0.93 1.20 1.46*   Chronic normocytic anemia Hemoglobin stable Recent Labs    05/13/24 0622 05/15/24 0550 05/29/24 0002 05/31/24 0213 06/03/24 0215  HGB 11.2* 11.1* 10.2* 9.4* 9.8*  MCV 82.4 81.5 83.1 84.0 82.8   Mobility:  PT Follow up Rec:  Skilled Nursing-Short Term Rehab (<3 Hours/Day)06/01/2024 1314   Goals of care   Code Status: Full Code    DVT prophylaxis:  apixaban  (ELIQUIS ) tablet 5 mg   Antimicrobials: IV ampicillin  Fluid:  None Consultants: Neurosurgery and ID Family Communication: None at bedside  Status: Inpatient Level of care:  Telemetry   Patient is from: Home Needs to continue in-hospital care: On IV antibiotics, creatinine elevated today.  Started on IV fluid Anticipated d/c to: Pending SNF    Diet:  Diet Order             Diet heart healthy/carb modified Room service appropriate? Yes; Fluid consistency: Thin  Diet effective now                   Scheduled Meds:  apixaban   5 mg Oral BID   atorvastatin   40 mg Oral QHS   Chlorhexidine  Gluconate Cloth  6 each Topical Daily   feeding supplement  237 mL Oral BID BM   gabapentin   300 mg Oral TID   insulin  aspart  0-15 Units Subcutaneous TID WC   metoprolol  succinate  25 mg Oral QHS   oxyCODONE   15 mg Oral Q12H   sodium chloride  flush  10-40 mL Intracatheter Q12H    PRN meds: acetaminophen  **OR** acetaminophen , guaiFENesin -dextromethorphan , HYDROmorphone  (DILAUDID ) injection, HYDROmorphone , lidocaine , ondansetron  **OR** ondansetron  (ZOFRAN ) IV, senna-docusate, sodium chloride  flush   Infusions:   sodium chloride  75 mL/hr at 06/03/24 0821   ampicillin  (OMNIPEN) IV 2 g (06/03/24 0822)    Antimicrobials: Anti-infectives (From admission, onward)    Start     Dose/Rate Route Frequency Ordered Stop   05/31/24 0000  ampicillin  IVPB        12 g Intravenous Every 24 hours 05/31/24 1327 07/08/24 2359   05/30/24 1600  ampicillin   Status:  Discontinued       Note to Pharmacy: As a continuous infusion. Indication:  Discitis First Dose: Yes Last Day of Therapy:  06/10/24 Labs - Once weekly:  CBC/D and BMP, Labs - Once weekly: ESR and CRP Method of administration: Ambulatory Pump (Continuous Infusion)   12 g Intravenous Every 24 hours 05/30/24 1028 05/30/24 1246   05/30/24 1400  ampicillin  (OMNIPEN) 2 g in sodium chloride  0.9 % 100 mL IVPB        2 g 300 mL/hr over 20 Minutes Intravenous Every 4 hours 05/30/24 1246          Objective: Vitals:   06/02/24 2052 06/03/24 0508  BP: 132/63 118/70  Pulse: 65 62  Resp: 18 18  Temp: 98.8 F (37.1 C) 98.1 F (36.7 C)  SpO2:      Intake/Output Summary (Last 24 hours) at 06/03/2024 1122 Last data filed at 06/03/2024 1017 Gross per 24 hour  Intake 600 ml  Output 1100 ml  Net -500 ml   Filed Weights   05/31/24 1036  Weight: 85.7 kg   Weight change:  Body mass index is 23.62 kg/m.   Physical Exam: General exam: Pleasant, elderly Caucasian male.  Pain worse this morning.  Pain medicine adjusted Skin: No rashes, lesions or ulcers..  Looks overall dry HEENT: Atraumatic, normocephalic, no obvious bleeding Lungs: Clear to auscultation bilaterally,  CVS: S1, S2, no murmur,   GI/Abd: Soft, nontender, nondistended, bowel sound present,   CNS: Alert, awake, oriented x 3 Psychiatry: Sad affect Extremities: No pedal edema, no calf tenderness,   Data Review: I have personally reviewed the laboratory data and studies  available.  F/u labs  Unresulted Labs (From admission, onward)     Start     Ordered   06/04/24 0500  Basic metabolic panel with GFR  Tomorrow morning,   R       Question:  Specimen collection method  Answer:  IV Team=IV Team collect   06/03/24 1122   06/04/24 0500  CBC with Differential/Platelet  Tomorrow morning,   R       Question:  Specimen collection method  Answer:  IV Team=IV Team collect   06/03/24 1122            Signed, Chapman Rota, MD [facility] 06/03/2024

## 2024-06-04 ENCOUNTER — Telehealth: Payer: Self-pay

## 2024-06-04 DIAGNOSIS — M549 Dorsalgia, unspecified: Secondary | ICD-10-CM | POA: Diagnosis not present

## 2024-06-04 LAB — CBC WITH DIFFERENTIAL/PLATELET
Abs Immature Granulocytes: 0.04 K/uL (ref 0.00–0.07)
Basophils Absolute: 0 K/uL (ref 0.0–0.1)
Basophils Relative: 1 %
Eosinophils Absolute: 0.1 K/uL (ref 0.0–0.5)
Eosinophils Relative: 1 %
HCT: 28.4 % — ABNORMAL LOW (ref 39.0–52.0)
Hemoglobin: 9 g/dL — ABNORMAL LOW (ref 13.0–17.0)
Immature Granulocytes: 1 %
Lymphocytes Relative: 23 %
Lymphs Abs: 1.3 K/uL (ref 0.7–4.0)
MCH: 26.5 pg (ref 26.0–34.0)
MCHC: 31.7 g/dL (ref 30.0–36.0)
MCV: 83.5 fL (ref 80.0–100.0)
Monocytes Absolute: 0.7 K/uL (ref 0.1–1.0)
Monocytes Relative: 12 %
Neutro Abs: 3.6 K/uL (ref 1.7–7.7)
Neutrophils Relative %: 62 %
Platelets: 150 K/uL (ref 150–400)
RBC: 3.4 MIL/uL — ABNORMAL LOW (ref 4.22–5.81)
RDW: 14.8 % (ref 11.5–15.5)
WBC: 5.8 K/uL (ref 4.0–10.5)
nRBC: 0 % (ref 0.0–0.2)

## 2024-06-04 LAB — BASIC METABOLIC PANEL WITH GFR
Anion gap: 6 (ref 5–15)
BUN: 19 mg/dL (ref 8–23)
CO2: 25 mmol/L (ref 22–32)
Calcium: 8.6 mg/dL — ABNORMAL LOW (ref 8.9–10.3)
Chloride: 104 mmol/L (ref 98–111)
Creatinine, Ser: 1.11 mg/dL (ref 0.61–1.24)
GFR, Estimated: >60 mL/min (ref >60)
Glucose, Bld: 176 mg/dL — ABNORMAL HIGH (ref 70–99)
Potassium: 3.9 mmol/L (ref 3.5–5.1)
Sodium: 135 mmol/L (ref 135–145)

## 2024-06-04 LAB — GLUCOSE, CAPILLARY
Glucose-Capillary: 138 mg/dL — ABNORMAL HIGH (ref 70–99)
Glucose-Capillary: 196 mg/dL — ABNORMAL HIGH (ref 70–99)
Glucose-Capillary: 165 mg/dL — ABNORMAL HIGH (ref 70–99)
Glucose-Capillary: 235 mg/dL — ABNORMAL HIGH (ref 70–99)

## 2024-06-04 NOTE — Progress Notes (Addendum)
 PROGRESS NOTE  Robert [name]  DOB: March 26, 1951  PCP: Garald Karlynn GAILS, MD FMW:[id number]  DOA: 05/29/2024  LOS: 5 days  Hospital Day: 7  Brief narrative: Robert [name] is a 73 y.o. male with PMH significant for DM2, HTN, HLD, paroxysmal A-fib on Eliquis , CKD 3, h/o bladder cancer s/p chemotherapy and cystoprostatectomy, right nephroureterectomy and total urethrectomy.   In early July, patient had L4-L5 discitis/osteomyelitis with phlegmon due to Enterococcus faecalis and was on treatment with continuous ampicillin  infusion pump. 8/6, patient presented to the ED with worsening back pain.  In the ED, patient was hemodynamically stable Labs showed WBC count of 8,   Lumbar spine MRI showed 1. Progressive collapse of the L5 vertebral body with diffuse edema and enhancement in the L4 and L5 vertebral bodies, and progressive marrow signal change and enhancement within the S1 vertebral body. Findings are consistent with progressive disc osteomyelitis at L4-5 and progressively into the S1 vertebral body. 2. Extensive paraspinous soft tissue enhancement and ventral epidural enhancement at L4 and L5. 3. Stable lumbar disc disease at L1-2 and L2-3. 4. Broad-based disc protrusion with moderate central and right foraminal stenosis at L3-4, similar to the prior study.    Thoracic spine MRI showed: 1. No evidence of discitis or other acute pathology in the thoracic spine. 2. Shallow central disc protrusion at T8-9 without significant stenosis.  Admitted to [facility] Per prior documentation, case was discussed with neurosurgery and ID.  Recommended nonsurgical management with thoracolumbar brace and IV antibiotics  Subjective: Patient was seen and examined this morning. Propped up in bed.  Pain control has been challenging. Switched morphine  to Dilaudid  yesterday because of AKI. Seen by PT again this morning.  CIR recommended.  Assessment and plan: Progressive osteomyelitis after  L4-L5  Presented with worsening back pain despite recent course of antibiotics Imaging as above. ID and neurosurgery consulted.  Recommended nonsurgical management with thoracolumbar brace and IV antibiotics Currently on IV ampicillin  Patient unable to tolerate TLSO Pain regimen --- Scheduled: OxyContin  15 mg twice daily, gabapentin  300 mg 3 times daily, Lidoderm  patch --- PRN: Dilaudid  4 mg every 4 hours, IV Dilaudid  0.5 mg 4 hours Seen by PT OT.    Type 2 diabetes mellitus A1c 7.1 on 05/31/2024 PTA meds-weekly Ozempic  Continue SSI/Accu-Cheks Currently on carb modified diet Recent Labs  Lab 06/03/24 1237 06/03/24 1652 06/03/24 2047 06/04/24 0721 06/04/24 1127  GLUCAP 191* 137* 163* 138* 196*   Hypertension Blood pressure controlled on metoprolol    Paroxysmal A-fib Currently controlled on Toprol  25 mg daily Continue chronic anticoagulation with apixaban  5 mg p.o. twice daily.   Hyperlipidemia Aortic atherosclerosis Continue atorvastatin  40 mg p.o. daily.   AKI on CKD 2 Creatinine worsened to 1.46 yesterday.  Improved down to normal with IV fluid.  Can stop IV fluid.  Encourage oral hydration. Recent Labs    05/09/24 1327 05/10/24 1423 05/11/24 0446 05/12/24 0532 05/13/24 0622 05/15/24 0550 05/29/24 0002 05/31/24 0213 06/03/24 0215 06/04/24 0437  BUN 19 16 14 15 15 17 12 14 20 19   CREATININE 1.24 1.22 1.15 1.05 1.08 1.09 0.93 1.20 1.46* 1.11   Chronic normocytic anemia Hemoglobin stable Recent Labs    05/15/24 0550 05/29/24 0002 05/31/24 0213 06/03/24 0215 06/04/24 0437  HGB 11.1* 10.2* 9.4* 9.8* 9.0*  MCV 81.5 83.1 84.0 82.8 83.5   Mobility:  PT Follow up Rec:  Acute Inpatient Rehab (3hours/Day)06/04/2024 1134   Goals of care   Code Status: Full Code  DVT prophylaxis:  apixaban  (ELIQUIS ) tablet 5 mg   Antimicrobials: IV ampicillin , EOT 07/08/2024 Fluid: None Consultants: Neurosurgery and ID Family Communication: None at bedside  Status:  Inpatient Level of care:  Telemetry   Patient is from: Home Needs to continue in-hospital care: Medically stable for discharge Anticipated d/c to: Pending SNF    Diet:  Diet Order             Diet heart healthy/carb modified Room service appropriate? Yes; Fluid consistency: Thin  Diet effective now                   Scheduled Meds:  apixaban   5 mg Oral BID   atorvastatin   40 mg Oral QHS   Chlorhexidine  Gluconate Cloth  6 each Topical Daily   feeding supplement  237 mL Oral BID BM   gabapentin   300 mg Oral TID   insulin  aspart  0-15 Units Subcutaneous TID WC   metoprolol  succinate  25 mg Oral QHS   oxyCODONE   15 mg Oral Q12H   sodium chloride  flush  10-40 mL Intracatheter Q12H    PRN meds: acetaminophen  **OR** acetaminophen , guaiFENesin -dextromethorphan , HYDROmorphone  (DILAUDID ) injection, HYDROmorphone , lidocaine , ondansetron  **OR** ondansetron  (ZOFRAN ) IV, oxyCODONE , senna-docusate, sodium chloride  flush   Infusions:   ampicillin  (OMNIPEN) IV 2 g (06/04/24 1214)    Antimicrobials: Anti-infectives (From admission, onward)    Start     Dose/Rate Route Frequency Ordered Stop   05/31/24 0000  ampicillin  IVPB        12 g Intravenous Every 24 hours 05/31/24 1327 07/08/24 2359   05/30/24 1600  ampicillin   Status:  Discontinued       Note to Pharmacy: As a continuous infusion. Indication:  Discitis First Dose: Yes Last Day of Therapy:  06/10/24 Labs - Once weekly:  CBC/D and BMP, Labs - Once weekly: ESR and CRP Method of administration: Ambulatory Pump (Continuous Infusion)   12 g Intravenous Every 24 hours 05/30/24 1028 05/30/24 1246   05/30/24 1400  ampicillin  (OMNIPEN) 2 g in sodium chloride  0.9 % 100 mL IVPB        2 g 300 mL/hr over 20 Minutes Intravenous Every 4 hours 05/30/24 1246         Objective: Vitals:   06/04/24 0503 06/04/24 1151  BP: 124/70 116/69  Pulse: 63 67  Resp: 17 18  Temp: (!) 97.5 F (36.4 C) 97.6 F (36.4 C)  SpO2: 100% 100%     Intake/Output Summary (Last 24 hours) at 06/04/2024 1552 Last data filed at 06/04/2024 1500 Gross per 24 hour  Intake 1683.75 ml  Output 2775 ml  Net -1091.25 ml   Filed Weights   05/31/24 1036  Weight: 85.7 kg   Weight change:  Body mass index is 23.62 kg/m.   Physical Exam: General exam: Pleasant, elderly Caucasian male.  Pain worse this morning.  Intermittently exacerbating pain Skin: No rashes, lesions or ulcers..  Looks overall dry HEENT: Atraumatic, normocephalic, no obvious bleeding Lungs: Clear to auscultation bilaterally,  CVS: S1, S2, no murmur,   GI/Abd: Soft, nontender, nondistended, bowel sound present,   CNS: Alert, awake, oriented x 3 Psychiatry: Sad affect Extremities: No pedal edema, no calf tenderness,   Data Review: I have personally reviewed the laboratory data and studies available.  F/u labs  Unresulted Labs (From admission, onward)    None       Signed, Chapman Rota, MD [facility] 06/04/2024

## 2024-06-04 NOTE — Plan of Care (Signed)
  Problem: Urinary Elimination: Goal: Home care management will improve Outcome: Progressing   Problem: Education: Goal: Knowledge of General Education information will improve Description: Including pain rating scale, medication(s)/side effects and non-pharmacologic comfort measures Outcome: Progressing   Problem: Clinical Measurements: Goal: Diagnostic test results will improve Outcome: Progressing Goal: Respiratory complications will improve Outcome: Progressing   Problem: Activity: Goal: Risk for activity intolerance will decrease Outcome: Progressing   Problem: Nutrition: Goal: Adequate nutrition will be maintained Outcome: Progressing   Problem: Elimination: Goal: Will not experience complications related to bowel motility Outcome: Progressing   Problem: Safety: Goal: Ability to remain free from injury will improve Outcome: Progressing   Problem: Education: Goal: Individualized Educational Video(s) Outcome: Progressing   Problem: Coping: Goal: Ability to adjust to condition or change in health will improve Outcome: Progressing   Problem: Health Behavior/Discharge Planning: Goal: Ability to manage health-related needs will improve Outcome: Progressing   Problem: Nutritional: Goal: Maintenance of adequate nutrition will improve Outcome: Progressing Goal: Progress toward achieving an optimal weight will improve Outcome: Progressing   Problem: Tissue Perfusion: Goal: Adequacy of tissue perfusion will improve Outcome: Progressing

## 2024-06-04 NOTE — Progress Notes (Signed)
 Physical Therapy Treatment Patient Details Name: Robert [name] MRN: [id number] DOB: 1951-10-04 Today's Date: 06/04/2024   History of Present Illness 73 y.o. male who presents today with worsening back pain. Lumbar spine MRI done on 05/30/2024 revealed:  1. Progressive collapse of the L5 vertebral body with diffuse edema and  enhancement in the L4 and L5 vertebral bodies, and progressive marrow signal change and enhancement within the S1 vertebral body. progressive disc osteomyelitis at L4-5 and progressively into the S1 vertebral body.  PMH: afib,  bladder CA, s/p robotic right nephrectomy, right ureterectomy, radical cystectomy and excision of prostatic urethra and pelvic lymph node dissection on 12/08/2023,  urinary tract infection in April 2025,  readmission to the hospital in May for AKI, stent placement in the left ureter,  port removal and appropriate antibiotic treatment for 2 weeks with a negative TEE, readmission to the hospital 03/27/2024 until 04/03/2024 for Enterococcus bacteremia, worsening back pain in July and readmission 7/18 to 05/15/2024 for back pain and MRI showed L4-L5 discitis and osteomyelitis with L5-S1 discitis and epidural phlegmon and he underwent a disc aspiration on 05/12/2024 which was E faecalis,  PICC placement.    PT Comments  The patient was premedicated with scheduled oxycontin . Patient slowly able to mobilize to sitting on the bed edge and then transition to standing at RW x 2, very small sidesteps taken. Patient C/O mild dizziness which did not worsen. Patient desires to progress and return home, wife has 24/7 caregivers.  Plan to see again in PM to progress activity as tolerated. Patient will benefit from intensive inpatient follow-up therapy, >3 hours/day    If plan is discharge home, recommend the following: A lot of help with walking and/or transfers;A lot of help with bathing/dressing/bathroom;Assistance with cooking/housework;Assist for transportation;Help  with stairs or ramp for entrance   Can travel by private vehicle      no  Equipment Recommendations    none   Recommendations for Other Services  OT     Precautions / Restrictions Precautions Precautions: Fall;Back Precaution Booklet Issued: Yes (comment) Recall of Precautions/Restrictions: Intact Precaution/Restrictions Comments: TLSO in room Restrictions Weight Bearing Restrictions Per Provider Order: No     Mobility  Bed Mobility   Bed Mobility: Supine to Sit, Rolling, Sit to Supine Rolling: Used rails, Supervision Sidelying to sit: Min assist, HOB elevated, Used rails Supine to sit: Used rails, HOB elevated, Max assist Sit to supine: Min assist   General bed mobility comments: patient able to roll to have TLSO applied and be removed, max use of rails, able to flex  both knees to facilitate rolling. Patient sitting in  more of longsitting with HOB raised max height and trunk laying against bed.Patient slowly moved legs over bed edge, max assist to pull on PT hand  to achieve full sittingupright. Once sitting, max use of UE's for trunk support to offset pressure through  back while in sitting. Pt.gradually able to move hands to RW.SABRA Assisted  legs back onto bed, patient moving slowly and max use of bed rails    Transfers Overall transfer level: Needs assistance Equipment used: Rolling walker (2 wheels) Transfers: Sit to/from Stand Sit to Stand: Min assist, From elevated surface, +2 safety/equipment           General transfer comment: Patient slowly able to stand from  max raised bed height at RW, able to stand and weight shift to each leg, tending to  WB mostly on the LLE, able to take very small  side steps along bed, patient sat down to bed for  a seated break, able to stand again, total standing time ~ 10  Just not ready to ambulate away from bed with complaints of feeling dizzy(first time standing in 3 days)    Ambulation/Gait                   Stairs              Wheelchair Mobility     Tilt Bed    Modified Rankin (Stroke Patients Only)       Balance Overall balance assessment: Needs assistance Sitting-balance support: Feet supported, Bilateral upper extremity supported Sitting balance-Leahy Scale: Fair Sitting balance - Comments: reliant on max support of the UE's to decrease pressure through spine and right hip   Standing balance support: Bilateral upper extremity supported, During functional activity, Reliant on assistive device for balance Standing balance-Leahy Scale: Poor Standing balance comment: max support on UE's  through RW                            Communication Communication Communication: No apparent difficulties  Cognition Arousal: Alert Behavior During Therapy: WFL for tasks assessed/performed, Anxious, Restless   PT - Cognitive impairments: No apparent impairments                         Following commands: Intact      Cueing    Exercises      General Comments        Pertinent Vitals/Pain Pain Assessment Pain Score: 8  Pain Location: lower back Pain Descriptors / Indicators: Discomfort, Guarding, Constant, Grimacing Pain Intervention(s): Premedicated before session, Monitored during session    Home Living     Available Help at Discharge: Personal care attendant (for wife)                    Prior Function            PT Goals (current goals can now be found in the care plan section) Progress towards PT goals: Progressing toward goals    Frequency    Min 3X/week      PT Plan      Co-evaluation              AM-PAC PT 6 Clicks Mobility   Outcome Measure  Help needed turning from your back to your side while in a flat bed without using bedrails?: A Lot Help needed moving from lying on your back to sitting on the side of a flat bed without using bedrails?: A Lot Help needed moving to and from a bed to a chair (including a  wheelchair)?: A Lot Help needed standing up from a chair using your arms (e.g., wheelchair or bedside chair)?: A Lot Help needed to walk in hospital room?: Total Help needed climbing 3-5 steps with a railing? : Total 6 Click Score: 10    End of Session Equipment Utilized During Treatment: Gait belt Activity Tolerance: Patient tolerated treatment well Patient left: in bed;with call /phone within reach;with bed alarm set Nurse Communication: Mobility status PT Visit Diagnosis: Other abnormalities of gait and mobility (R26.89);Pain Pain - Right/Left: Right Pain - part of body: Hip     Time: 8948-8882 PT Time Calculation (min) (ACUTE ONLY): 26 min  Charges:    $Therapeutic Activity: 23-37 mins PT General Charges $$ ACUTE PT VISIT: 1 Visit  [name] PT Acute Rehabilitation Services Office [phone]    [name] 06/04/2024, 11:37 AM

## 2024-06-04 NOTE — Progress Notes (Signed)
 Inpatient Rehab Admissions Coordinator Note:   Per PT recommendations patient was screened for CIR candidacy by Reche FORBES Lowers, PT. At this time, pt appears to be a potential candidate for CIR. I will place an order for rehab consult for full assessment, per our protocol.  Please contact me any with questions.SABRA Reche Lowers, PT, DPT [phone] 06/04/24 2:38 PM

## 2024-06-04 NOTE — TOC Progression Note (Addendum)
 Transition of Care [facility]) - Progression Note    Patient Details  Name: Robert [name] MRN: [id number] Date of Birth: 02-Aug-1951  Transition of [facility]) CM/SW Contact  Heather DELENA Saltness, LCSW Phone Number: 06/04/2024, 12:30 PM  Clinical Narrative:    PT now recommending AIR at CIR. CIR to review. TOC will continue to follow.   Expected Discharge Plan: Home w Home Health Services Barriers to Discharge: Continued Medical Work up   Expected Discharge Plan and Services In-house Referral: NA Discharge Planning Services: CM Consult Post Acute Care Choice: Home Health Living arrangements for the past 2 months: Single Family Home                 DME Arranged: N/A DME Agency: NA       HH Arranged: RN, IV Antibiotics HH Agency: [facility] Date HH Agency Contacted: 05/31/24 Time HH Agency Contacted: [phone] Representative spoke with at [facility] Agency: Amy   Social Drivers of Health (SDOH) Interventions SDOH Screenings   Food Insecurity: No Food Insecurity (05/30/2024)  Housing: High Risk (05/30/2024)  Transportation Needs: No Transportation Needs (05/30/2024)  Utilities: Not At Risk (05/30/2024)  Alcohol  Screen: Low Risk  (04/08/2024)  Depression (PHQ2-9): Low Risk  (05/09/2024)  Financial Resource Strain: Low Risk  (04/08/2024)  Physical Activity: Inactive (04/08/2024)  Social Connections: Moderately Integrated (05/31/2024)  Recent Concern: Social Connections - Moderately Isolated (03/28/2024)  Stress: Stress Concern Present (04/08/2024)  Tobacco Use: High Risk (05/31/2024)  Health Literacy: Adequate Health Literacy (04/08/2024)    Readmission Risk Interventions    04/01/2024    2:58 PM 02/07/2024    2:07 PM 12/10/2023    4:50 PM  Readmission Risk Prevention Plan  Transportation Screening Complete Complete Complete  PCP or Specialist Appt within 5-7 Days   Complete  PCP or Specialist Appt within 3-5 Days Complete Complete   Home Care Screening   Complete  Medication Review (RN  CM)   Complete  HRI or Home Care Consult Complete Complete   Social Work Consult for Recovery Care Planning/Counseling Complete Complete   Palliative Care Screening Not Applicable Not Applicable   Medication Review [employer]) Complete Referral to Pharmacy     Signed: Heather Saltness, MSW, LCSW Clinical Social Worker Inpatient Care Management 06/04/2024 12:30 PM

## 2024-06-04 NOTE — Plan of Care (Signed)
  Problem: Education: Goal: Knowledge of the procedure and recovery process will improve Outcome: Progressing   Problem: Clinical Measurements: Goal: Ability to maintain clinical measurements within normal limits will improve Outcome: Progressing   Problem: Coping: Goal: Level of anxiety will decrease Outcome: Progressing   Problem: Pain Managment: Goal: General experience of comfort will improve and/or be controlled Outcome: Not Progressing

## 2024-06-04 NOTE — Progress Notes (Signed)
 PT Cancellation Note  Patient Details Name: Robert [name] MRN: [id number] DOB: 1951/03/22   Cancelled Treatment:    Reason Eval/Treat Not Completed: Patient declined,  states he is resting well, and would like to defer until AM. Will work around oain medication for mobility. [name] PT Acute Rehabilitation Services Office [phone]   [name] 06/04/2024, 4:02 PM

## 2024-06-05 DIAGNOSIS — M549 Dorsalgia, unspecified: Secondary | ICD-10-CM | POA: Diagnosis not present

## 2024-06-05 LAB — CULTURE, BLOOD (ROUTINE X 2)
Culture: NO GROWTH
Culture: NO GROWTH

## 2024-06-05 LAB — GLUCOSE, CAPILLARY
Glucose-Capillary: 155 mg/dL — ABNORMAL HIGH (ref 70–99)
Glucose-Capillary: 193 mg/dL — ABNORMAL HIGH (ref 70–99)
Glucose-Capillary: 210 mg/dL — ABNORMAL HIGH (ref 70–99)
Glucose-Capillary: 170 mg/dL — ABNORMAL HIGH (ref 70–99)

## 2024-06-05 MED ORDER — POLYETHYLENE GLYCOL 3350 17 G PO PACK
17.0000 g | PACK | Freq: Every day | ORAL | Status: DC
  Administered 2024-06-05 – 2024-06-12 (×9): 17 g via ORAL
  Filled 2024-06-05 (×8): qty 1

## 2024-06-05 NOTE — Consult Note (Signed)
 Physical Medicine and Rehabilitation Consult Reason for Consult: Vertebral osteomyelitis and discitis at L4-L5, L5-S1 due to enterococcus faecalis bacteremia with paraspinal infection  Referring Physician: Deliliah Room, MD   HPI: Robert [name] is a 73 y.o. male with PMH of DM2, HTN, HLD, paroxysmal atrial fibrillation on Eliquis , SKD stage 3, history of bladder cancer s/p chemotherapy and cystoprostatectomy, right nephroureterectomy and total urethrectomy. In early July patient had L4-L5 discitis/osteomyelitis with phlegmon due to enterococcus faecalis and was on treatment with continuous ampicillin  infusion pump. On 8/6 he presented to the ED with worsening back pain. Lumbar Spine showed progressive collapse of the L5 vertebral body with diffuse edema and enhancement in the L4 and L5 vertebral bodies, and progressive marrow signal change and enhancement and ventral epidural enhancement at L4 and L5, stable lumbar disc disease at L1-L2 and L2-L3, broad-based disc protrusion with moderate central and right foraminal stenosis at L3-L4, similar to prior study. Thoracic spine MRI showed no evidence of discitis or other acute pathology in the thoracic spine and shallow central disc protrusion at T8-9 without significant stenosis. He is on IV ampicillin  until 07/08/24. Physical Medicine & Rehabilitation was consulted to assess candidacy for CIR.      Home: Home Living Family/patient expects to be discharged to:: Private residence Living Arrangements: Spouse/significant other Available Help at Discharge: Personal care attendant (for wife) Type of Home: House Home Access: Stairs to enter Secretary/administrator of Steps: 3 Home Layout: One level Home Equipment: Agricultural consultant (2 wheels) Additional Comments: pt is spouse's caregiver who has dementia, she is physically ind pt providing supv and memory cues; recently hired caregivers 24/7  Functional History: Prior Function Mobility  Comments: amb with RW since last admission - was supposed to get HHPT, never started per pt ADLs Comments: pt reports ind Functional Status:  Mobility: Bed Mobility Overal bed mobility: Needs Assistance Bed Mobility: Supine to Sit, Rolling, Sit to Supine Rolling: Used rails, Supervision Sidelying to sit: Min assist, HOB elevated, Used rails Supine to sit: Used rails, HOB elevated, Max assist Sit to supine: Min assist Sit to sidelying: Mod assist General bed mobility comments: patient able to roll to have TLSO applied and be removed, max use of rails, abl to flex  both knees to facilitate rolling. Patient sitting in  more of longsitting with HOB raised max height anf trunk laying againt bed.Patient slowly moves legs over bed edge, max assist to pull to full sitting. Once sitting, max use of UE's for trunk support to offset pressure through  back while in sitting. Pt.gradually able to move hands to RW.SABRA Assisted  legs back onto bed, patient moving slowly and max use of bed rails Transfers Overall transfer level: Needs assistance Equipment used: Rolling walker (2 wheels) Transfers: Sit to/from Stand Sit to Stand: Min assist, From elevated surface, +2 safety/equipment General transfer comment: Patient slowly able to stand from  max raised bed height at RW, able to stand and weight shift to each leg, tending to  WB mostly on the LLE, able to take very small side steps along bed, patient st and rested and stood again, total standing time ~ 10  Just not ready to ambulate away from bed with complaints of feeling dizzy(first time standing in 3 days)      ADL:    Cognition: Cognition Orientation Level: Oriented X4 Cognition Arousal: Alert Behavior During Therapy: WFL for tasks assessed/performed, Anxious, Restless   ROS +spinal pain Past Medical History:  Diagnosis Date   Anticoagulant long-term use    eliquis --- managed by cardiology   Aortic atherosclerosis (HCC) 05/30/2024    Arthritis    Bladder cancer [facility])    urologist--- dr nieves;  overlapping   Cataract    History of basal cell carcinoma (BCC) excision    History of COVID-19 05/2019   per pt mild symptoms that resolved   HLD (hyperlipidemia)    HTN (hypertension)    Hx of colonic polyps    Insulin  dependent type 2 diabetes mellitus [facility])    endocrinologist--- dr trixie --  (03-11-2022 per pt check blood sugar 1-2 times daily,  fasting sugar-- 118-120s)   Nocturia    Paroxysmal atrial fibrillation (HCC) 03/12/2019   cardiologist--- dr calhoun   Renal disorder    Ureterocele    Use of cane as ambulatory aid    Past Surgical History:  Procedure Laterality Date   BLEPHAROPLASTY Bilateral    per pt approx  2001;   upper eyelid's   COLONOSCOPY     last one approx 2023   CYSTOSCOPY W/ RETROGRADES N/A 12/08/2023   Procedure: CYSTOSCOPY;  Surgeon: Alvaro Ricardo KATHEE Mickey., MD;  Location: [facility];  Service: Urology;  Laterality: N/A;  390 MINUTES NEEDED FOR CASE   CYSTOSCOPY WITH URETEROSCOPY AND STENT PLACEMENT Right 12/02/2022   Procedure: CYSTOSCOPY WITH RIGHT URETEROSCOPY, RIGHT DIAGNOSTIC URETEROSCOPY;  Surgeon: nieves Cough, MD;  Location: [facility];  Service: Urology;  Laterality: Right;  90 MINS FOR CASE   CYSTOSCOPY/RETROGRADE/URETEROSCOPY Bilateral 05/05/2023   Procedure: CYSTOSCOPY BILATERAL RETROGRADE PYELOGRAM RIGHT URETEROSCOPY RIGHT STENT PLACEMENT;  Surgeon: nieves Cough, MD;  Location: [facility];  Service: Urology;  Laterality: Bilateral;  90 MINS FOR CASE   IR IMAGING GUIDED PORT INSERTION  05/30/2023   IR LUMBAR DISC ASPIRATION W/IMG GUIDE  05/12/2024   IR REMOVAL TUN ACCESS W/ PORT W/O FL MOD SED  03/29/2024   PATELLAR TENDON REPAIR Left 04/16/2004   @[facility]    ROBOT ASSITED LAPAROSCOPIC NEPHROURETERECTOMY Right 12/08/2023   Procedure: RIGHT ROBOT ASSISTED Nephroureterectomy;  Surgeon: Alvaro Ricardo KATHEE Mickey., MD;  Location: [facility];  Service: Urology;  Laterality: Right;    ROBOT LAP RADICAL CYSTOPROSTATECTOMY PELVIC LYMPHADENECTOMY, NEOBLADDER  12/08/2023   Procedure: ROBOT ASSISTED LAPAROSCOPIC RADICAL CYSTOPROSTATECTOMY;  Surgeon: Alvaro Ricardo KATHEE Mickey., MD;  Location: [facility];  Service: Urology;;   TOTAL URETHRECTOMY,RADICAL N/A 12/08/2023   Procedure: TOTAL URETHRECTOMY,RADICAL;  Surgeon: Alvaro Ricardo KATHEE Mickey., MD;  Location: [facility];  Service: Urology;  Laterality: N/A;   TRANSESOPHAGEAL ECHOCARDIOGRAM (CATH LAB) N/A 04/02/2024   Procedure: TRANSESOPHAGEAL ECHOCARDIOGRAM;  Surgeon: Delford Maude BROCKS, MD;  Location[facility] INVASIVE CV LAB;  Service: Cardiovascular;  Laterality: N/A;   TRANSURETHRAL RESECTION OF BLADDER TUMOR N/A 06/18/2021   Procedure: TRANSURETHRAL RESECTION OF BLADDER TUMOR (TURBT) WITH RIGHT URETERAL STENT PLACEMENT, RIGHT URETEROSCOPY WITH DISTRACTION OF TUMOR, FULGURATION;  Surgeon: nieves Cough, MD;  Location: [facility];  Service: Urology;  Laterality: N/A;   TRANSURETHRAL RESECTION OF BLADDER TUMOR N/A 03/15/2022   Procedure: TRANSURETHRAL RESECTION OF BLADDER TUMOR (TURBT) WITH BILATERAL URETEROSCOPY/URETHRAL/ DILATION/BILATERAL RETROGRADE PYELOGRAM/BIOPSY AND FULGURATION OF RIGHT URETERAL CANCER/RIGHT STENT PLACEMENT;  Surgeon: nieves Cough, MD;  Location: [facility];  Service: Urology;  Laterality: N/A;   TRANSURETHRAL RESECTION OF BLADDER TUMOR N/A 05/05/2023   Procedure: TRANSURETHRAL RESECTION OF BLADDER TUMOR (TURBT) BLADDER BIOSPY;  Surgeon: nieves Cough, MD;  Location: [facility];  Service: Urology;  Laterality: N/A;   TRANSURETHRAL  RESECTION OF BLADDER TUMOR WITH MITOMYCIN -C N/A 12/02/2022   Procedure: TRANSURETHRAL RESECTION OF BLADDER TUMOR;  Surgeon: Nieves Cough, MD;  Location: [facility];  Service: Urology;  Laterality: N/A;   TRANSURETHRAL RESECTION OF PROSTATE  05/05/2023   Procedure: TRANSURETHRAL RESECTION OF THE PROSTATE (TURP);  Surgeon: Nieves Cough, MD;  Location:  [facility];  Service: Urology;;   Family History  Problem Relation Age of Onset   Pancreatic cancer Mother 38   Cancer Paternal Uncle        unknown type cancer   Aortic aneurysm Other        family history   Cancer Other        Breast (1st degree relative)   Colon cancer Neg Hx    Rectal cancer Neg Hx    Stomach cancer Neg Hx    Esophageal cancer Neg Hx    Social History:  reports that he has been smoking cigars and cigarettes. He started smoking about 13 years ago. He has a 3.7 pack-year smoking history. He has never used smokeless tobacco. He reports current alcohol  use of about 3.0 standard drinks of alcohol  per week. He reports that he does not use drugs. Allergies:  Allergies  Allergen Reactions   Lisinopril  Swelling and Other (See Comments)    Tongue and throat became swollen   Lyrica  [Pregabalin ] Swelling and Other (See Comments)    Patient noticed swelling of the ankles and feet when taking this, so he stopped using it.   Medications Prior to Admission  Medication Sig Dispense Refill   apixaban  (ELIQUIS ) 5 MG TABS tablet Take 1 tablet (5 mg total) by mouth 2 (two) times daily. 180 tablet 1   atorvastatin  (LIPITOR) 40 MG tablet Take 1 tablet (40 mg total) by mouth at bedtime.     B Complex-C (B-COMPLEX WITH VITAMIN C) tablet Take 1 tablet by mouth 3 (three) times a week.     Cholecalciferol  (VITAMIN D -3 PO) Take 1 tablet by mouth 3 (three) times a week.     EPINEPHrine  0.3 mg/0.3 mL IJ SOAJ injection Inject 0.3 mg into the muscle as needed for anaphylaxis. 1 each 0   gabapentin  (NEURONTIN ) 300 MG capsule Take 300 mg by mouth 3 (three) times daily.     lidocaine  (LIDODERM ) 5 % Place 1 patch onto the skin daily. Remove & Discard patch within 12 hours or as directed by MD (Patient taking differently: Place 1 patch onto the skin daily as needed (pain).) 30 patch 0   methocarbamol  (ROBAXIN ) 500 MG tablet Take 1 tablet (500 mg total) by mouth every 8 (eight) hours  as needed for muscle spasms. (Patient taking differently: Take 500 mg by mouth daily as needed for muscle spasms.) 60 tablet 0   metoprolol  succinate (TOPROL -XL) 25 MG 24 hr tablet Take 1 tablet (25 mg total) by mouth daily. (Patient taking differently: Take 25 mg by mouth at bedtime.) 90 tablet 2   propranolol  (INDERAL ) 10 MG tablet TAKE 1 TABLET BY MOUTH 4 TIMES DAILY AS NEEDED FOR PALPITATIONS, IRREGULAR OR FAST HEART RATE 270 tablet 1   Semaglutide , 1 MG/DOSE, (OZEMPIC , 1 MG/DOSE,) 4 MG/3ML SOPN INJECT 1 MG UNDER THE SKIN ONCE A WEEK AS DIRECTED 9 mL 3   senna-docusate (SENOKOT-S) 8.6-50 MG tablet Take 1-2 tablets by mouth 2 (two) times daily between meals as needed for mild constipation or moderate constipation. (Patient taking differently: Take 1 tablet by mouth 2 (two) times daily.)     traMADol  (ULTRAM )  50 MG tablet Take 1 tablet (50 mg total) by mouth every 8 (eight) hours as needed. (Patient taking differently: Take 50 mg by mouth daily as needed for moderate pain (pain score 4-6) or severe pain (pain score 7-10).) 90 tablet 1   TYLENOL  500 MG tablet Take 1,000 mg by mouth daily as needed for mild pain (pain score 1-3), headache or moderate pain (pain score 4-6).     [DISCONTINUED] ampicillin  IVPB Inject 12 g into the vein daily for 26 days. As a continuous infusion. Indication:  Discitis First Dose: Yes Last Day of Therapy:  06/10/24 Labs - Once weekly:  CBC/D and BMP, Labs - Once weekly: ESR and CRP Method of administration: Ambulatory Pump (Continuous Infusion) Method of administration may be changed at the discretion of home infusion pharmacist based upon assessment of the patient and/or caregiver's ability to self-administer the medication ordered. 26 Units 0   ampicillin  (PRINCIPEN) 500 MG capsule Take 500 mg by mouth 3 (three) times daily. (Patient not taking: Reported on 05/30/2024)     furosemide  (LASIX ) 20 MG tablet Take 20 mg by mouth daily. (Patient not taking: Reported on 05/30/2024)      oxyCODONE  (OXY IR/ROXICODONE ) 5 MG immediate release tablet Take 5 mg by mouth daily as needed for severe pain (pain score 7-10) or moderate pain (pain score 4-6). (Patient not taking: Reported on 05/30/2024)       Blood pressure 129/63, pulse 71, temperature 98.4 F (36.9 C), resp. rate 16, height 6' 3 (1.905 m), weight 85.7 kg, SpO2 100%. Physical Exam Gen: no distress, normal appearing HEENT: oral mucosa pink and moist, NCAT Cardio: Reg rate Chest: normal effort, normal rate of breathing Abd: soft, non-distended Ext: no edema Psych: pleasant, normal affect Skin: intact Neuro: Alert and oriented x3, 4/5 strength throughout Results for orders placed or performed during the hospital encounter of 05/29/24 (from the past 24 hours)  Glucose, capillary     Status: Abnormal   Collection Time: 06/04/24 11:27 AM  Result Value Ref Range   Glucose-Capillary 196 (H) 70 - 99 mg/dL  Glucose, capillary     Status: Abnormal   Collection Time: 06/04/24  4:19 PM  Result Value Ref Range   Glucose-Capillary 165 (H) 70 - 99 mg/dL  Glucose, capillary     Status: Abnormal   Collection Time: 06/04/24  9:34 PM  Result Value Ref Range   Glucose-Capillary 235 (H) 70 - 99 mg/dL  Glucose, capillary     Status: Abnormal   Collection Time: 06/05/24  7:41 AM  Result Value Ref Range   Glucose-Capillary 155 (H) 70 - 99 mg/dL   No results found.  Assessment/Plan: Diagnosis: Vertebral osteomyelitis and discitis at L4-L5, L5-S1 due to enterococcus faecalis bacteremia with paraspinal infection Does the need for close, 24 hr/day medical supervision in concert with the patient's rehab needs make it unreasonable for this patient to be served in a less intensive setting? Yes Co-Morbidities requiring supervision/potential complications:  1) Intractable back pain: consider increasing gabapentin  to 400mg  TID 2) Bilateral hip pain: consider adding voltaren gel 3) Type 2 diabetes: continue ISS 4) Atrial  fibrillation: continue Eliquis  and Metoprolol  5) HLD: continue atorvastatin  Due to bladder management, bowel management, safety, skin/wound care, disease management, medication administration, pain management, and patient education, does the patient require 24 hr/day rehab nursing? Yes Does the patient require coordinated care of a physician, rehab nurse, therapy disciplines of PT, OT to address physical and functional deficits in the context of the above  medical diagnosis(es)? Yes Addressing deficits in the following areas: balance, endurance, locomotion, strength, transferring, bowel/bladder control, bathing, dressing, feeding, grooming, toileting, and psychosocial support Can the patient actively participate in an intensive therapy program of at least 3 hrs of therapy per day at least 5 days per week? Yes The potential for patient to make measurable gains while on inpatient rehab is excellent Anticipated functional outcomes upon discharge from inpatient rehab are modified independent  with PT, modified independent with OT, independent with SLP. Estimated rehab length of stay to reach the above functional goals is: 10-14 days Anticipated discharge destination: Home Overall Rehab/Functional Prognosis: excellent  POST ACUTE RECOMMENDATIONS: This patient's condition is appropriate for continued rehabilitative care in the following setting: CIR Patient has agreed to participate in recommended program. Yes Note that insurance prior authorization may be required for reimbursement for recommended care.   I have personally performed a face to face diagnostic evaluation of this patient. Additionally, I have examined the patient's medical record including any pertinent labs and radiographic images.    Thanks,  Sven SHAUNNA Elks, MD 06/05/2024

## 2024-06-05 NOTE — Progress Notes (Addendum)
 PROGRESS NOTE    Robert [name]  FMW:[id number] DOB: 03/26/1951 DOA: 05/29/2024 PCP: Garald Karlynn GAILS, MD   Brief Narrative:   73 y.o. male with PMH significant for DM2, HTN, HLD, paroxysmal A-fib on Eliquis , CKD 3, h/o bladder cancer s/p chemotherapy and cystoprostatectomy, right nephroureterectomy and total urethrectomy.   In early July, patient had L4-L5 discitis/osteomyelitis with phlegmon due to Enterococcus faecalis and was on treatment with continuous ampicillin  infusion pump. 8/6, patient presented to the ED with worsening back pain.   Lumbar spine MRI showed Progressive collapse of the L5 vertebral body with diffuse edema and enhancement in the L4 and L5 vertebral bodies, and progressive marrow signal change and enhancement within the S1 vertebral body. Findings are consistent with progressive disc osteomyelitis at L4-5 and progressively into the S1 vertebral body, Extensive paraspinous soft tissue enhancement and ventral epidural enhancement at L4 and L5, Stable lumbar disc disease at L1-2 and L2-3, Broad-based disc protrusion with moderate central and right foraminal stenosis at L3-4, similar to the prior study.    Thoracic spine MRI showed No evidence of discitis or other acute pathology in the thoracic spine and Shallow central disc protrusion at T8-9 without significant stenosis.  Pending placement to CIR On IV ampicillin  until 07/08/24.  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 ID and neurosurgery consulted.  Recommended non-surgical management with thoracolumbar brace and IV antibiotics Currently, he is on IV ampicillin  until 9/15 Patient unable to tolerate TLSO Pain regimen --- Scheduled: OxyContin  15 mg twice daily, gabapentin  300 mg 3 times daily, Lidoderm  patch --- PRN: Dilaudid  4 mg every 4 hours, IV Dilaudid  0.5 mg 4 hours Seen by PT OT.     Type 2 diabetes mellitus A1c 7.1 on 05/31/2024 PTA meds-weekly Ozempic  Continue SSI/Accu-Cheks Currently on carb modified diet   Hypertension Blood pressure controlled on metoprolol    Paroxysmal A-fib Currently, rate controlled on Toprol  25 mg daily Continue chronic anticoagulation with apixaban  5 mg p.o. twice daily.   Hyperlipidemia Aortic atherosclerosis Continue atorvastatin  40 mg p.o. daily.   AKI on CKD 2, resolved Encouraged oral hydration.  Chronic normocytic anemia Hemoglobin stable  Bladder carcinoma status post robotic right nephrectomy, ureterectomy, radical cystectomy creation of ileal urostomy after resection of prostate urethra: No acute issues   Left hydronephrosis and ureteral stent in May 2025: urology follow up    Disposition: Pending Acute inpatient rehab (CIR)    DVT prophylaxis:  apixaban  (ELIQUIS ) tablet 5 mg     Code Status: Full Code Family Communication:   Status is: Inpatient Remains inpatient appropriate because: Pending CIR placement    Subjective:  He said that his pain is much better controlled.  Whenever he moves, he develops sharp pain over the right lower back.  We discussed about inpatient rehab placement and need for extensive physical therapy.  Examination:  General exam: Appears calm and comfortable  Respiratory system: Clear to auscultation. Respiratory effort normal.  Cardiovascular system: S1 & S2 heard, RRR. No JVD, murmurs, rubs, gallops or  clicks. No pedal edema. Gastrointestinal system: Abdomen is nondistended, soft and nontender. No organomegaly or masses felt. Normal bowel sounds heard. Central nervous system: Alert and oriented. No focal neurological deficits. Extremities: Symmetric 5 x 5 power. Skin: No rashes, lesions or ulcers Psychiatry: Judgement and insight appear normal. Mood & affect appropriate.      Diet Orders (From admission, onward)     Start     Ordered   05/30/24 0914  Diet heart healthy/carb modified Room service appropriate? Yes; Fluid consistency: Thin  Diet effective now       Question Answer Comment  Diet-HS Snack? Nothing   Room service appropriate? Yes   Fluid consistency: Thin      05/30/24 0913            Objective: Vitals:   06/04/24 0503 06/04/24 1151 06/04/24 2042 06/05/24 0339  BP: 124/70 116/69 125/71 129/63  Pulse: 63 67 76 71  Resp: 17 18 16 16   Temp: (!) 97.5 F (36.4 C) 97.6 F (36.4 C) 97.9 F (36.6 C) 98.4 F (36.9 C)  TempSrc: Oral     SpO2: 100% 100% 98% 100%  Weight:      Height:        Intake/Output Summary (Last 24 hours) at 06/05/2024 0922 Last data filed at 06/05/2024 0746 Gross per 24 hour  Intake --  Output 3950 ml  Net -3950 ml   Filed Weights   05/31/24 1036  Weight: 85.7 kg    Scheduled Meds:  apixaban   5 mg Oral BID   atorvastatin   40 mg Oral QHS   Chlorhexidine  Gluconate Cloth  6 each Topical Daily   feeding supplement  237 mL Oral BID BM   gabapentin   300 mg Oral TID   insulin  aspart  0-15 Units Subcutaneous TID WC   metoprolol  succinate  25 mg Oral QHS   oxyCODONE   15 mg Oral Q12H   sodium chloride  flush  10-40 mL Intracatheter Q12H   Continuous Infusions:  ampicillin  (OMNIPEN) IV 2 g (06/05/24 0842)    Nutritional status     Body mass index is 23.62 kg/m.  Data Reviewed:   CBC: Recent Labs  Lab 05/31/24 0213 06/03/24 0215 06/04/24 0437  WBC 7.1 6.3 5.8  NEUTROABS  --  4.0 3.6  HGB 9.4* 9.8* 9.0*  HCT 29.9* 30.4*  28.4*  MCV 84.0 82.8 83.5  PLT 178 177 150   Basic Metabolic Panel: Recent Labs  Lab 05/31/24 0213 06/03/24 0215 06/04/24 0437  NA 137 136 135  K 4.0 4.2 3.9  CL 102 100 104  CO2 24 27 25   GLUCOSE 123* 143* 176*  BUN 14 20 19   CREATININE 1.20 1.46* 1.11  CALCIUM  8.7* 8.9 8.6*   GFR: Estimated Creatinine Clearance: 70.8 mL/min (by C-G formula based on SCr of 1.11 mg/dL). Liver Function Tests: Recent Labs  Lab 05/31/24 0213  AST 10*  ALT 10  ALKPHOS 69  BILITOT 0.6  PROT 6.1*  ALBUMIN 2.5*   No results for input(s): LIPASE, AMYLASE in the last 168 hours. No results for input(s): AMMONIA in the last 168 hours. Coagulation Profile: No results for input(s): INR, PROTIME in the last 168 hours. Cardiac Enzymes: No results for input(s): CKTOTAL, CKMB, CKMBINDEX, TROPONINI in the last 168 hours. BNP (last 3 results) No results for input(s): PROBNP in the last 8760 hours. HbA1C: No results for input(s): HGBA1C in the last  72 hours. CBG: Recent Labs  Lab 06/04/24 0721 06/04/24 1127 06/04/24 1619 06/04/24 2134 06/05/24 0741  GLUCAP 138* 196* 165* 235* 155*   Lipid Profile: No results for input(s): CHOL, HDL, LDLCALC, TRIG, CHOLHDL, LDLDIRECT in the last 72 hours. Thyroid  Function Tests: No results for input(s): TSH, T4TOTAL, FREET4, T3FREE, THYROIDAB in the last 72 hours. Anemia Panel: No results for input(s): VITAMINB12, FOLATE, FERRITIN, TIBC, IRON, RETICCTPCT in the last 72 hours. Sepsis Labs: No results for input(s): PROCALCITON, LATICACIDVEN in the last 168 hours.  Recent Results (from the past 240 hours)  Culture, blood (Routine X 2) w Reflex to ID Panel     Status: None   Collection Time: 05/31/24  4:38 AM   Specimen: BLOOD RIGHT ARM  Result Value Ref Range Status   Specimen Description   Final    BLOOD RIGHT ARM Performed at [facility], [address], [place], [place] [address]     Special Requests   Final    BOTTLES DRAWN AEROBIC ONLY Blood Culture results may not be optimal due to an inadequate volume of blood received in culture bottles Performed at [facility], [address], [place], [place] [address]    Culture   Final    NO GROWTH 5 DAYS Performed at [facility], [address], [place], [place] [address]    Report Status 06/05/2024 FINAL  Final  Culture, blood (Routine X 2) w Reflex to ID Panel     Status: None   Collection Time: 05/31/24  4:44 AM   Specimen: BLOOD LEFT HAND  Result Value Ref Range Status   Specimen Description   Final    BLOOD LEFT HAND Performed at [facility], [address], [place], [place] [address]    Special Requests   Final    BOTTLES DRAWN AEROBIC ONLY Blood Culture results may not be optimal due to an inadequate volume of blood received in culture bottles Performed at [facility], [address] [place], [place] [address]    Culture   Final    NO GROWTH 5 DAYS Performed at [facility], [address], [place], [place] [address]    Report Status 06/05/2024 FINAL  Final         Radiology Studies: No results found.      LOS: 6 days   Time spent= 41 mins    Deliliah Room, MD [facility]  If 7PM-7AM, please contact night-coverage  06/05/2024, 9:22 AM

## 2024-06-05 NOTE — Progress Notes (Signed)
 Inpatient Rehab Admissions Coordinator:   Attempted to reach pt by phone, but no response.  Will continue efforts to discuss rehab recommendations and available caregiver support.   Reche Lowers, PT, DPT Admissions Coordinator [phone] 06/05/24  3:19 PM

## 2024-06-05 NOTE — Progress Notes (Signed)
 Occupational Therapy Treatment Patient Details Name: Robert [name] MRN: [id number] DOB: [birth year] Today's Date: 06/05/2024   History of present illness 73 y.o. 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 Lumbar spine 8/7 revealed progressive collapse of L5 vertebral body with diffuse edema. Neurosurgery recommended non-surgical mgmt with TLSO, pain control and IV antibiotics. Pt with complex PMH and multiple recent hospitalizations including but not limited to: AFIB, ladder cancer s/p chemotherapy and cystoprostatectomy, right nephroureterectomy and total urethrectomy, AFIB, Type II DM, HLD, HTN, COVID-19, basal cell carcinoma, TEE, PICC placement.   OT comments  Pt seen for second OT session this date. Pt received sitting EOB with RN present, expressing need for urgent BM. TLSO donned sitting EOB, pt able to direct placement and requests bed elevated for STS transfer. Pt requires CGA-MIN A for functional transfers using RW, improved hand placement with good carryover from previous session, and is ambulate to the bathroom 2x with up to MIN A for posterior sway / lean. LB dressing completed MAX A from STS, pt performs pericare using lateral leans with supervision but MAX A provided in standing for thoroughness. Pt left in care of NT/RN end of session for urostomy bag mgmt. Pt making good progress towards goals, OT will continue to follow. Patient will benefit from intensive inpatient follow-up therapy, >3 hours/day       If plan is discharge home, recommend the following:  Two people to help with walking and/or transfers;Two people to help with bathing/dressing/bathroom;Assistance with cooking/housework;Direct supervision/assist for medications management;Direct supervision/assist for financial management;Assist for transportation;Help with stairs or ramp for entrance;Supervision due to cognitive status   Equipment Recommendations  None  recommended by OT    Recommendations for Other Services Rehab consult    Precautions / Restrictions Precautions Precautions: Fall;Back Precaution Booklet Issued: Yes (comment) Recall of Precautions/Restrictions: Impaired Precaution/Restrictions Comments: custom TLSO in room, applied in seated Restrictions Weight Bearing Restrictions Per Provider Order: No       Mobility Bed Mobility Overal bed mobility: Needs Assistance Bed Mobility: Supine to Sit, Rolling, Sit to Supine Rolling: Used rails, Supervision Sidelying to sit: Used rails, Contact guard assist, HOB elevated Supine to sit: Used rails, HOB elevated, Contact guard     General bed mobility comments: Pt prefers to sit with bed elevated, pivots on bottom using BUE for scooting. Once BLE are in bed, he is able to roll to doff TLSO.    Transfers Overall transfer level: Needs assistance Equipment used: Rolling walker (2 wheels) Transfers: Sit to/from Stand Sit to Stand: Contact guard assist, Min assist, From elevated surface           General transfer comment: CGA - MIN A to rise from elevated bed height. Performs multiple STS transfers (bed, BSC, recliner) throughout session. Pt seated in recliner, but unable to position for comfort, requests to go back to bed     Balance Overall balance assessment: Needs assistance Sitting-balance support: Feet supported, Bilateral upper extremity supported Sitting balance-Leahy Scale: Fair Sitting balance - Comments: reliant on max support of the UE's to decrease pressure through spine and right hip   Standing balance support: Bilateral upper extremity supported, During functional activity, Reliant on assistive device for balance Standing balance-Leahy Scale: Poor Standing balance comment: max support on UE's  through RW                           ADL either performed or  assessed with clinical judgement   ADL Overall ADL's : Needs assistance/impaired Eating/Feeding:  Independent;Sitting   Grooming: Sitting;Wash/dry hands Grooming Details (indicate cue type and reason): seated EOB Upper Body Bathing: Minimal assistance;Sitting   Lower Body Bathing: Sit to/from stand;Cueing for back precautions   Upper Body Dressing : Minimal assistance;Sitting   Lower Body Dressing: Maximal assistance;With adaptive equipment;Cueing for safety;Sit to/from stand;Sitting/lateral leans Lower Body Dressing Details (indicate cue type and reason): OT threads pants through feet and brings up to knees; pt is able to pull up over hips in standing with MAX A for trunk support Toilet Transfer: Minimal assistance;BSC/3in1;Rolling walker (2 wheels);Ambulation Toilet Transfer Details (indicate cue type and reason): BSC over standard commode; pt with improved carryover from earlier session for hand placement (4x STS transfers completed with MIN A - CGA) Toileting- Clothing Manipulation and Hygiene: Maximal assistance;Sit to/from stand;Sitting/lateral lean Toileting - Clothing Manipulation Details (indicate cue type and reason): pt able to perform with lateral leans, MAX A for standing pericare for throughness     Functional mobility during ADLs: Contact guard assist;Minimal assistance;Rolling walker (2 wheels);Cueing for safety;Cueing for sequencing General ADL Comments: Pt recieved sitting EOB with RN, attempting to get OOB with alarm activated. Pt with urgent need for BM, RN and OT provide redirection for catheter bag and IV line mgmt (urostomy bag disconnected by patient while in bed)    Extremity/Trunk Assessment Upper Extremity Assessment Upper Extremity Assessment: Generalized weakness   Lower Extremity Assessment Lower Extremity Assessment: Defer to PT evaluation   Cervical / Trunk Assessment Cervical / Trunk Assessment: Other exceptions (TLSO donned)    Vision Baseline Vision/History: 1 Wears glasses Ability to See in Adequate Light: 0 Adequate Patient Visual Report: No  change from baseline     Perception     Praxis     Communication Communication Communication: No apparent difficulties   Cognition Arousal: Alert Behavior During Therapy: WFL for tasks assessed/performed, Anxious, Restless Cognition: Cognition impaired     Awareness: Intellectual awareness impaired   Attention impairment (select first level of impairment): Sustained attention Executive functioning impairment (select all impairments): Organization, Initiation, Sequencing, Reasoning, Problem solving OT - Cognition Comments: Pt with low frusteration tolerance; impatient at times with RN/OT redirecting for safety                 Following commands: Intact        Cueing   Cueing Techniques: Verbal cues        General Comments RN in room start of session to assist with IV line mgmt, notified RN of continent BM and urostomy bag disconnected    Pertinent Vitals/ Pain       Pain Assessment Pain Assessment: Faces Pain Score: 5  Faces Pain Scale: Hurts little more Pain Location: back Pain Descriptors / Indicators: Discomfort, Guarding, Constant, Grimacing Pain Intervention(s): Limited activity within patient's tolerance, Repositioned  Home Living Family/patient expects to be discharged to:: Private residence Living Arrangements: Spouse/significant other Available Help at Discharge: Personal care attendant Type of Home: House Home Access: Stairs to enter Secretary/administrator of Steps: 3 Entrance Stairs-Rails: Right Home Layout: One level     Bathroom Shower/Tub: Walk-in shower         Home Equipment: Agricultural consultant (2 wheels)              Frequency  Min 3X/week        Progress Toward Goals  OT Goals(current goals can now be found in the care plan section)  Progress  towards OT goals: Progressing toward goals  Acute Rehab OT Goals OT Goal Formulation: With patient Time For Goal Achievement: 06/19/24 Potential to Achieve Goals: Good ADL  Goals Pt Will Perform Grooming: standing;with modified independence Pt Will Perform Lower Body Dressing: with adaptive equipment;sit to/from stand;with supervision Pt Will Transfer to Toilet: with contact guard assist;ambulating;bedside commode;grab bars Pt Will Perform Toileting - Clothing Manipulation and hygiene: sitting/lateral leans;sit to/from stand;with adaptive equipment  Plan      Co-evaluation    PT/OT/SLP Co-Evaluation/Treatment: Yes Reason for Co-Treatment: For patient/therapist safety;To address functional/ADL transfers PT goals addressed during session: Mobility/safety with mobility OT goals addressed during session: ADL's and self-care      AM-PAC OT 6 Clicks Daily Activity     Outcome Measure   Help from another person eating meals?: None Help from another person taking care of personal grooming?: A Little Help from another person toileting, which includes using toliet, bedpan, or urinal?: A Lot Help from another person bathing (including washing, rinsing, drying)?: A Lot Help from another person to put on and taking off regular upper body clothing?: A Little Help from another person to put on and taking off regular lower body clothing?: A Lot 6 Click Score: 16    End of Session Equipment Utilized During Treatment: Rolling walker (2 wheels);Gait belt (TLSO)  OT Visit Diagnosis: Unsteadiness on feet (R26.81);Other abnormalities of gait and mobility (R26.89);Muscle weakness (generalized) (M62.81);Pain Pain - Right/Left: Right Pain - part of body:  (LBP)   Activity Tolerance Patient tolerated treatment well;No increased pain   Patient Left in bed;with call bell/phone within reach;with bed alarm set;with nursing/sitter in room   Nurse Communication Mobility status;Precautions        Time: 1550-1620 OT Time Calculation (min): 30 min  Charges: OT General Charges $OT Visit: 1 Visit OT Evaluation $OT Eval Low Complexity: 1 Low OT Treatments $Self  Care/Home Management : 23-37 mins  Autym Siess L. Aritha Huckeba, OTR/L  06/05/24, 4:48 PM

## 2024-06-05 NOTE — Progress Notes (Signed)
 Physical Therapy Treatment Patient Details Name: Robert [name] MRN: [id number] DOB: [birth year] Today's Date: 06/05/2024   History of Present Illness 73 y.o. male who presents today with worsening back pain. Lumbar spine MRI done on 05/30/2024 revealed:  1. Progressive collapse of the L5 vertebral body with diffuse edema and  enhancement in the L4 and L5 vertebral bodies, and progressive marrow signal change and enhancement within the S1 vertebral body. progressive disc osteomyelitis at L4-5 and progressively into the S1 vertebral body.  PMH: afib,  bladder CA, s/p robotic right nephrectomy, right ureterectomy, radical cystectomy and excision of prostatic urethra and pelvic lymph node dissection on 12/08/2023,  urinary tract infection in April 2025,  readmission to the hospital in May for AKI, stent placement in the left ureter,  port removal and appropriate antibiotic treatment for 2 weeks with a negative TEE, readmission to the hospital 03/27/2024 until 04/03/2024 for Enterococcus bacteremia, worsening back pain in July and readmission 7/18 to 05/15/2024 for back pain and MRI showed L4-L5 discitis and osteomyelitis with L5-S1 discitis and epidural phlegmon and he underwent a disc aspiration on 05/12/2024 which was E faecalis,  PICC placement.    PT Comments  Patient premedicated with oxycontin   and IV dilaudid .  Patient able to progress with mobility with HOB max  position and max use of bed rails to  mobilize to sitting, max bed height to stand at RW. Patient did ambulate slowly x 20' and 15', knees flexed slightly, trunk sway posterior.  Patient is quite limited in self care, donning TLSO, socks due to back precautions. Continue  progressive mobility.    If plan is discharge home, recommend the following: A lot of help with walking and/or transfers;A lot of help with bathing/dressing/bathroom;Assistance with cooking/housework;Assist for transportation;Help with stairs or ramp for entrance   Can  travel by private vehicle        Equipment Recommendations  None recommended by PT    Recommendations for Other Services       Precautions / Restrictions Precautions Precautions: Fall;Back Recall of Precautions/Restrictions: Intact Precaution/Restrictions Comments: TLSO in room, rolls  to have TLSOP placed with assist to push under back, pt. able to reach behind and pull lumbar support around and secured it with no assistance Restrictions Weight Bearing Restrictions Per Provider Order: No     Mobility  Bed Mobility     Rolling: Used rails, Supervision Sidelying to sit: Used rails, Contact guard assist, HOB elevated       General bed mobility comments: patient able to roll to have TLSO applied , max use of rails, able to flex  both knees to facilitate rolling. Patient sitting in  more of longsitting with HOB raised max height anf trunk laying againt bed.Patient slowly moves legs over bed edge,Patient able to move to sitting upright, max use of UE's.Pt.gradually able to move hands to RW..    Transfers Overall transfer level: Needs assistance Equipment used: Rolling walker (2 wheels) Transfers: Sit to/from Stand Sit to Stand: Contact guard assist, From elevated surface           General transfer comment: Patient slowly able to stand from  max raised bed height at RW,    Ambulation/Gait Ambulation/Gait assistance: Min assist, +2 safety/equipment Gait Distance (Feet): 20 Feet (then 15') Assistive device: Rolling walker (2 wheels) Gait Pattern/deviations: Step-to pattern Gait velocity: decr     General Gait Details: knees slightly bent,trunnk somewaht posterior toambulate step at a time   Stairs  Wheelchair Mobility     Tilt Bed    Modified Rankin (Stroke Patients Only)       Balance Overall balance assessment: Needs assistance Sitting-balance support: Feet supported, Bilateral upper extremity supported Sitting balance-Leahy Scale:  Fair Sitting balance - Comments: reliant on max support of the UE's to decrease pressure through spine and right hip   Standing balance support: Bilateral upper extremity supported, During functional activity, Reliant on assistive device for balance Standing balance-Leahy Scale: Poor Standing balance comment: max support on UE's  through RW                            Communication Communication Communication: No apparent difficulties  Cognition Arousal: Alert Behavior During Therapy: WFL for tasks assessed/performed, Anxious, Restless   PT - Cognitive impairments: No apparent impairments                                Cueing    Exercises      General Comments        Pertinent Vitals/Pain Pain Assessment Pain Assessment: Faces Faces Pain Scale: Hurts whole lot Pain Location: lower back, when rolling and sitting Pain Descriptors / Indicators: Discomfort, Guarding, Constant, Grimacing Pain Intervention(s): Monitored during session, Premedicated before session, Repositioned    Home Living                          Prior Function            PT Goals (current goals can now be found in the care plan section) Progress towards PT goals: Progressing toward goals    Frequency    Min 3X/week      PT Plan      Co-evaluation PT/OT/SLP Co-Evaluation/Treatment: Yes Reason for Co-Treatment: For patient/therapist safety;To address functional/ADL transfers PT goals addressed during session: Mobility/safety with mobility        AM-PAC PT 6 Clicks Mobility   Outcome Measure  Help needed turning from your back to your side while in a flat bed without using bedrails?: A Little Help needed moving from lying on your back to sitting on the side of a flat bed without using bedrails?: A Lot Help needed moving to and from a bed to a chair (including a wheelchair)?: A Lot Help needed standing up from a chair using your arms (e.g., wheelchair or  bedside chair)?: A Lot Help needed to walk in hospital room?: A Lot Help needed climbing 3-5 steps with a railing? : Total 6 Click Score: 12    End of Session Equipment Utilized During Treatment: Gait belt Activity Tolerance: Patient tolerated treatment well Patient left: in chair (with OT) Nurse Communication: Mobility status PT Visit Diagnosis: Other abnormalities of gait and mobility (R26.89);Pain Pain - Right/Left: Right Pain - part of body: Hip     Time: 8876-8856 PT Time Calculation (min) (ACUTE ONLY): 20 min  Charges:    $Gait Training: 8-22 mins PT General Charges $$ ACUTE PT VISIT: 1 Visit                     [name] PT Acute Rehabilitation Services Office [phone]    [name] 06/05/2024, 12:05 PM

## 2024-06-05 NOTE — Evaluation (Addendum)
 Occupational Therapy Evaluation Patient Details Name: Robert [name] MRN: [id number] DOB: 1951/07/16 Today's Date: 06/05/2024   History of Present Illness   73 y.o. 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 Lumbar spine 8/7 revealed progressive collapse of L5 vertebral body with diffuse edema. Neurosurgery recommended non-surgical mgmt with TLSO, pain control and IV antibiotics. Pt with complex PMH and multiple recent hospitalizations including but not limited to: AFIB, ladder cancer s/p chemotherapy and cystoprostatectomy, right nephroureterectomy and total urethrectomy, AFIB, Type II DM, HLD, HTN, COVID-19, basal cell carcinoma, TEE, PICC placement.     Clinical Impressions Pt admitted with above diagnosis. Prior to hospital admission, pt was mod independent in ADLs and uses RW for mobility. 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 Pt pre-medicated prior to mobilizing with OT/PT team and was able to progress mobility vs previous sessions. Pt motivated and anticipate good tolerance to further therapy sessions if pain managed appropriately.   Pt received with PT in room, custom TLSO donned. Pt able to perform bed mobility with supervision, directing to have HOB elevated to max height for comfort and transitioning BLE off bed and moving to a seated position in more of a long sitting position due to TLSO / pt preference due to pain levels. Pt requires CGA - MIN A (+2 present for safety) for functional transfers from elevated bed height, is able to complete functional mobility to bathroom using RW with posterior sway and requires max multimodal cuing for hand placement, line/lead awareness and back precautions. Oral care performed standing at sink, up to MIN A for trunk stability and balance. Will require up to MAX A for LB ADLs given back pain and TSLO. Pt presents with decreased insight into deficits, poor  safety awareness, impaired strength, balance and mobility which affects performance in ADLs. Pt is far from functional baseline.   Pt would benefit from skilled OT services to address noted impairments and functional limitations (see below for any additional details) in order to maximize safety and independence while minimizing falls risk and caregiver burden. Anticipate the need for follow up OT services upon acute hospital DC. Patient will benefit from intensive inpatient follow-up therapy, >3 hours/day.      If plan is discharge home, recommend the following:   Two people to help with walking and/or transfers;Two people to help with bathing/dressing/bathroom;Assistance with cooking/housework;Direct supervision/assist for medications management;Direct supervision/assist for financial management;Assist for transportation;Help with stairs or ramp for entrance;Supervision due to cognitive status     Functional Status Assessment   Patient has had a recent decline in their functional status and demonstrates the ability to make significant improvements in function in a reasonable and predictable amount of time.     Equipment Recommendations   None recommended by OT     Recommendations for Other Services   Rehab consult     Precautions/Restrictions   Precautions Precautions: Fall;Back Recall of Precautions/Restrictions: Impaired Precaution/Restrictions Comments: custom TLSO in room, applied in bed Restrictions Weight Bearing Restrictions Per Provider Order: No urostomy bag in place      Mobility Bed Mobility Overal bed mobility: Needs Assistance Bed Mobility: Supine to Sit, Rolling, Sit to Supine Rolling: Used rails, Supervision Sidelying to sit: Used rails, Contact guard assist, HOB elevated Supine to sit: Used rails, HOB elevated, Contact guard     General bed mobility comments: Pt transitions towards EOB in long sitting, bed max elevated and uses both hands  to scoot  towards EOB (TLSO donned). Pt able to roll to doff TLSO end of session.    Transfers Overall transfer level: Needs assistance Equipment used: Rolling walker (2 wheels) Transfers: Sit to/from Stand Sit to Stand: Contact guard assist, Min assist, From elevated surface           General transfer comment: CGA - MIN A to rise from elevated bed height. Performs multiple STS transfers (bed, BSC, recliner) throughout session. Pt seated in recliner, but unable to position for comfort, requests to go back to bed      Balance Overall balance assessment: Needs assistance Sitting-balance support: Feet supported, Bilateral upper extremity supported Sitting balance-Leahy Scale: Fair Sitting balance - Comments: reliant on max support of the UE's to decrease pressure through spine and right hip   Standing balance support: Bilateral upper extremity supported, During functional activity, Reliant on assistive device for balance Standing balance-Leahy Scale: Poor Standing balance comment: max support on UE's  through RW                           ADL either performed or assessed with clinical judgement   ADL Overall ADL's : Needs assistance/impaired Eating/Feeding: Independent;Sitting   Grooming: Wash/dry face;Standing;Minimal assistance Grooming Details (indicate cue type and reason): up to minA at trunk for stability, cues for BLT precautions, pt mild posterior lean in standing due to guarding/pain Upper Body Bathing: Minimal assistance;Sitting   Lower Body Bathing: Sit to/from stand;Cueing for back precautions   Upper Body Dressing : Minimal assistance;Sitting   Lower Body Dressing: Maximal assistance;With adaptive equipment;Cueing for safety;Sit to/from stand;Sitting/lateral leans   Toilet Transfer: Minimal assistance;BSC/3in1;Rolling walker (2 wheels);Ambulation Toilet Transfer Details (indicate cue type and reason): pt requires minA for support at trunk, max multimodal cuing for  hand placement, BLT precautions, awareness of line/leads Toileting- Clothing Manipulation and Hygiene: Maximal assistance;Sit to/from stand Toileting - Clothing Manipulation Details (indicate cue type and reason): maxA to manage clothing from STS, pt with poor awareness of balance deficits     Functional mobility during ADLs: Contact guard assist;Minimal assistance;Rolling walker (2 wheels);Cueing for safety;Cueing for sequencing General ADL Comments: Pt recieved with PT; TLSO donned. Pt able to progress mobility this date, +2 present but did not need for physical assist (+2 for safety)     Vision Baseline Vision/History: 1 Wears glasses Ability to See in Adequate Light: 0 Adequate Patient Visual Report: No change from baseline              Pertinent Vitals/Pain Pain Assessment Pain Assessment: 0-10 Pain Score: 5  Pain Location: back Pain Descriptors / Indicators: Discomfort, Guarding, Constant, Grimacing Pain Intervention(s): Limited activity within patient's tolerance, Premedicated before session, Monitored during session, Repositioned     Extremity/Trunk Assessment Upper Extremity Assessment Upper Extremity Assessment: Generalized weakness   Lower Extremity Assessment Lower Extremity Assessment: Defer to PT evaluation   Cervical / Trunk Assessment Cervical / Trunk Assessment: Other exceptions (TLSO donned)   Communication Communication Communication: No apparent difficulties   Cognition Arousal: Alert Behavior During Therapy: WFL for tasks assessed/performed, Anxious, Restless Cognition: Cognition impaired     Awareness: Intellectual awareness impaired   Attention impairment (select first level of impairment): Sustained attention Executive functioning impairment (select all impairments): Organization, Initiation, Sequencing, Reasoning, Problem solving OT - Cognition Comments: Decreased insight into deficits, poor awareness of safety and environment. Requires  constant cues for urostomy line mgmt/IV pole  Following commands: Intact       Cueing  General Comments   Cueing Techniques: Verbal cues  Pt premedicated just prior to session. VSS on RA.           Home Living Family/patient expects to be discharged to:: Private residence Living Arrangements: Spouse/significant other Available Help at Discharge: Personal care attendant Type of Home: House Home Access: Stairs to enter Secretary/administrator of Steps: 3 Entrance Stairs-Rails: Right Home Layout: One level     Bathroom Shower/Tub: Walk-in shower         Home Equipment: Agricultural consultant (2 wheels)          Prior Functioning/Environment Prior Level of Function : Independent/Modified Independent             Mobility Comments: using RW ADLs Comments: independent    OT Problem List: Decreased range of motion;Decreased strength;Decreased activity tolerance;Impaired balance (sitting and/or standing);Decreased coordination;Decreased cognition;Decreased safety awareness;Decreased knowledge of use of DME or AE;Decreased knowledge of precautions;Pain;Impaired sensation   OT Treatment/Interventions: Self-care/ADL training;Therapeutic exercise;Neuromuscular education;Energy conservation;DME and/or AE instruction;Splinting;Therapeutic activities;Patient/family education;Balance training      OT Goals(Current goals can be found in the care plan section)   Acute Rehab OT Goals OT Goal Formulation: With patient Time For Goal Achievement: 06/19/24 Potential to Achieve Goals: Good   OT Frequency:  Min 3X/week    Co-evaluation PT/OT/SLP Co-Evaluation/Treatment: Yes Reason for Co-Treatment: For patient/therapist safety;To address functional/ADL transfers PT goals addressed during session: Mobility/safety with mobility OT goals addressed during session: ADL's and self-care      AM-PAC OT 6 Clicks Daily Activity     Outcome Measure Help from  another person eating meals?: None Help from another person taking care of personal grooming?: A Little Help from another person toileting, which includes using toliet, bedpan, or urinal?: A Lot Help from another person bathing (including washing, rinsing, drying)?: A Lot Help from another person to put on and taking off regular upper body clothing?: A Little Help from another person to put on and taking off regular lower body clothing?: A Lot 6 Click Score: 16   End of Session Equipment Utilized During Treatment: Rolling walker (2 wheels);Gait belt Nurse Communication: Mobility status;Precautions  Activity Tolerance: Patient tolerated treatment well;No increased pain Patient left: in bed;with call bell/phone within reach;with bed alarm set  OT Visit Diagnosis: Unsteadiness on feet (R26.81);Other abnormalities of gait and mobility (R26.89);Muscle weakness (generalized) (M62.81);Pain Pain - Right/Left: Right Pain - part of body:  (LBP)                Time: 1130-1200 OT Time Calculation (min): 30 min Charges:  OT General Charges $OT Visit: 1 Visit OT Evaluation $OT Eval Low Complexity: 1 Low OT Treatments $Self Care/Home Management : 8-22 mins Ryon Layton L. Atreus Hasz, OTR/L  06/05/24, 1:44 PM

## 2024-06-06 ENCOUNTER — Encounter: Payer: Self-pay | Admitting: Hematology

## 2024-06-06 ENCOUNTER — Inpatient Hospital Stay: Attending: Hematology

## 2024-06-06 ENCOUNTER — Other Ambulatory Visit: Payer: Self-pay

## 2024-06-06 ENCOUNTER — Inpatient Hospital Stay: Admitting: Hematology

## 2024-06-06 ENCOUNTER — Other Ambulatory Visit

## 2024-06-06 DIAGNOSIS — M549 Dorsalgia, unspecified: Secondary | ICD-10-CM | POA: Diagnosis not present

## 2024-06-06 LAB — GLUCOSE, CAPILLARY
Glucose-Capillary: 138 mg/dL — ABNORMAL HIGH (ref 70–99)
Glucose-Capillary: 199 mg/dL — ABNORMAL HIGH (ref 70–99)
Glucose-Capillary: 237 mg/dL — ABNORMAL HIGH (ref 70–99)
Glucose-Capillary: 135 mg/dL — ABNORMAL HIGH (ref 70–99)

## 2024-06-06 NOTE — Progress Notes (Signed)
 PROGRESS NOTE    Robert [name]  FMW:[id number] DOB: 09/13/1951 DOA: 05/29/2024 PCP: Garald Karlynn GAILS, MD   Brief Narrative:   74 y.o. male with PMH significant for DM2, HTN, HLD, paroxysmal A-fib on Eliquis , CKD 3, h/o bladder cancer s/p chemotherapy and cystoprostatectomy, right nephroureterectomy and total urethrectomy.   In early July, patient had L4-L5 discitis/osteomyelitis with phlegmon due to Enterococcus faecalis and was on treatment with continuous ampicillin  infusion pump. 8/6, patient presented to the ED with worsening back pain.   Lumbar spine MRI showed Progressive collapse of the L5 vertebral body with diffuse edema and enhancement in the L4 and L5 vertebral bodies, and progressive marrow signal change and enhancement within the S1 vertebral body. Findings are consistent with progressive disc osteomyelitis at L4-5 and progressively into the S1 vertebral body, Extensive paraspinous soft tissue enhancement and ventral epidural enhancement at L4 and L5, Stable lumbar disc disease at L1-2 and L2-3, Broad-based disc protrusion with moderate central and right foraminal stenosis at L3-4, similar to the prior study.    Thoracic spine MRI showed No evidence of discitis or other acute pathology in the thoracic spine and Shallow central disc protrusion at T8-9 without significant stenosis.  Pending placement to CIR On IV ampicillin  until 07/08/24.  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 ID and neurosurgery consulted.  Recommended non-surgical management with thoracolumbar brace and IV antibiotics Currently, he is on IV ampicillin  until 9/15 Patient unable to tolerate TLSO Pain regimen --- Scheduled: OxyContin  15 mg twice daily, gabapentin  300 mg 3 times daily, Lidoderm  patch --- PRN: Dilaudid  4 mg every 4 hours, IV Dilaudid  0.5 mg 4 hours Seen by PT OT.     Type 2 diabetes mellitus A1c 7.1 on 05/31/2024 PTA meds-weekly Ozempic  Continue SSI/Accu-Cheks Currently on carb modified diet   Hypertension Blood pressure controlled on metoprolol    Paroxysmal A-fib Currently, rate controlled on Toprol  25 mg daily Continue chronic anticoagulation with apixaban  5 mg p.o. twice daily.   Hyperlipidemia Aortic atherosclerosis Continue atorvastatin  40 mg p.o. daily.   AKI on CKD 2, resolved Encouraged oral hydration.  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 Pain is significantly better. He did have a bowel movement yesterday after miralax .  Examination:  General exam: Appears calm and comfortable  Respiratory system: Clear to auscultation. Respiratory effort normal. Cardiovascular system: S1 & S2 heard, RRR. No JVD, murmurs, rubs, gallops or clicks.  No pedal edema. Gastrointestinal system: Abdomen is nondistended, soft and  nontender. No organomegaly or masses felt. Normal bowel sounds heard. Central nervous system: Alert and oriented. No focal neurological deficits. Extremities: Symmetric 5 x 5 power. Skin: No rashes, lesions or ulcers Psychiatry: Judgement and insight appear normal. Mood & affect appropriate.      Diet Orders (From admission, onward)     Start     Ordered   05/30/24 0914  Diet heart healthy/carb modified Room service appropriate? Yes; Fluid consistency: Thin  Diet effective now       Question Answer Comment  Diet-HS Snack? Nothing   Room service appropriate? Yes   Fluid consistency: Thin      05/30/24 0913            Objective: Vitals:   06/05/24 0339 06/05/24 1349 06/05/24 2205 06/06/24 0420  BP: 129/63 128/63 130/85 115/81  Pulse: 71 63 63 (!) 57  Resp: 16 18 16 16   Temp: 98.4 F (36.9 C) 97.9 F (36.6 C) 98 F (36.7 C) 97.7 F (36.5 C)  TempSrc:      SpO2: 100% 99% 98% 99%  Weight:      Height:        Intake/Output Summary (Last 24 hours) at 06/06/2024 1020 Last data filed at 06/06/2024 0900 Gross per 24 hour  Intake 1220 ml  Output 2375 ml  Net -1155 ml   Filed Weights   05/31/24 1036  Weight: 85.7 kg    Scheduled Meds:  apixaban   5 mg Oral BID   atorvastatin   40 mg Oral QHS   Chlorhexidine  Gluconate Cloth  6 each Topical Daily   feeding supplement  237 mL Oral BID BM   gabapentin   300 mg Oral TID   insulin  aspart  0-15 Units Subcutaneous TID WC   metoprolol  succinate  25 mg Oral QHS   oxyCODONE   15 mg Oral Q12H   polyethylene glycol  17 g Oral Daily   sodium chloride  flush  10-40 mL Intracatheter Q12H   Continuous Infusions:  ampicillin  (OMNIPEN) IV 2 g (06/06/24 0818)    Nutritional status     Body mass index is 23.62 kg/m.  Data Reviewed:   CBC: Recent Labs  Lab 05/31/24 0213 06/03/24 0215 06/04/24 0437  WBC 7.1 6.3 5.8  NEUTROABS  --  4.0 3.6  HGB 9.4* 9.8* 9.0*  HCT 29.9* 30.4* 28.4*  MCV 84.0 82.8 83.5  PLT 178 177 150    Basic Metabolic Panel: Recent Labs  Lab 05/31/24 0213 06/03/24 0215 06/04/24 0437  NA 137 136 135  K 4.0 4.2 3.9  CL 102 100 104  CO2 24 27 25   GLUCOSE 123* 143* 176*  BUN 14 20 19   CREATININE 1.20 1.46* 1.11  CALCIUM  8.7* 8.9 8.6*   GFR: Estimated Creatinine Clearance: 70.8 mL/min (by C-G formula based on SCr of 1.11 mg/dL). Liver Function Tests: Recent Labs  Lab 05/31/24 0213  AST 10*  ALT 10  ALKPHOS 69  BILITOT 0.6  PROT 6.1*  ALBUMIN 2.5*   No results for input(s): LIPASE, AMYLASE in the last 168 hours. No results for input(s): AMMONIA in the last 168 hours. Coagulation Profile: No results for input(s): INR, PROTIME in the last 168 hours. Cardiac Enzymes: No results for input(s): CKTOTAL, CKMB, CKMBINDEX, TROPONINI in the last 168 hours. BNP (last 3 results) No results for input(s): PROBNP in the last 8760 hours. HbA1C: No results for input(s): HGBA1C in the last 72 hours. CBG: Recent  Labs  Lab 06/05/24 0741 06/05/24 1212 06/05/24 1619 06/05/24 2206 06/06/24 0743  GLUCAP 155* 193* 210* 170* 138*   Lipid Profile: No results for input(s): CHOL, HDL, LDLCALC, TRIG, CHOLHDL, LDLDIRECT in the last 72 hours. Thyroid  Function Tests: No results for input(s): TSH, T4TOTAL, FREET4, T3FREE, THYROIDAB in the last 72 hours. Anemia Panel: No results for input(s): VITAMINB12, FOLATE, FERRITIN, TIBC, IRON, RETICCTPCT in the last 72 hours. Sepsis Labs: No results for input(s): PROCALCITON, LATICACIDVEN in the last 168 hours.  Recent Results (from the past 240 hours)  Culture, blood (Routine X 2) w Reflex to ID Panel     Status: None   Collection Time: 05/31/24  4:38 AM   Specimen: BLOOD RIGHT ARM  Result Value Ref Range Status   Specimen Description   Final    BLOOD RIGHT ARM Performed at [facility], [address] [place], [place] [address]    Special Requests   Final    BOTTLES DRAWN  AEROBIC ONLY Blood Culture results may not be optimal due to an inadequate volume of blood received in culture bottles Performed at [facility], [address], [place], [place] [address]    Culture   Final    NO GROWTH 5 DAYS Performed at [facility], [address], [place], [place] [address]    Report Status 06/05/2024 FINAL  Final  Culture, blood (Routine X 2) w Reflex to ID Panel     Status: None   Collection Time: 05/31/24  4:44 AM   Specimen: BLOOD LEFT HAND  Result Value Ref Range Status   Specimen Description   Final    BLOOD LEFT HAND Performed at [facility], [address] [place], [place] [address]    Special Requests   Final    BOTTLES DRAWN AEROBIC ONLY Blood Culture results may not be optimal due to an inadequate volume of blood received in culture bottles Performed at [facility], [address], [place], [place] [address]    Culture   Final    NO GROWTH 5 DAYS Performed at [facility], [address] [place], [place] [address]    Report Status 06/05/2024 FINAL  Final         Radiology Studies: No results found.      LOS: 7 days   Time spent= 41 mins    Deliliah Room, MD [facility]  If 7PM-7AM, please contact night-coverage  06/06/2024, 10:20 AM

## 2024-06-06 NOTE — Progress Notes (Addendum)
 Physical Therapy Treatment Patient Details Name: Robert [name] MRN: [id number] DOB: December 06, 1950 Today's Date: 06/06/2024   History of Present Illness 73 y.o. 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 Lumbar spine 8/7 revealed progressive collapse of L5 vertebral body with diffuse edema. Neurosurgery recommended non-surgical mgmt with TLSO, pain control and IV antibiotics. Pt with complex PMH and multiple recent hospitalizations including but not limited to: AFIB, ladder cancer s/p chemotherapy and cystoprostatectomy, right nephroureterectomy and total urethrectomy, AFIB, Type II DM, HLD, HTN, COVID-19, basal cell carcinoma, TEE, PICC placement.    PT Comments  The patient continues to report  high pain, had oxycontin  prior. Pt. requested pain meds at end of therapy session.  Patient is limited in ambulation distance  using Rw due to back pain.. Patient is very motivated to progress to return home. Slowly making gains in mobility.  Patient will benefit from intensive inpatient follow-up therapy, >3 hours/day    If plan is discharge home, recommend the following: A lot of help with walking and/or transfers;A lot of help with bathing/dressing/bathroom;Assistance with cooking/housework;Assist for transportation;Help with stairs or ramp for entrance   Can travel by private vehicle        Equipment Recommendations  None recommended by PT    Recommendations for Other Services       Precautions / Restrictions Precautions Precautions: Fall;Back Precaution Booklet Issued: Yes (comment) Recall of Precautions/Restrictions: Impaired Precaution/Restrictions Comments: custom TLSO in room, applied in seated bed position/HOPB raised, rolls to one side partially while  therpaist slides lumbar support under bac, patient then able to reach under and get strap and tighten. Restrictions Weight Bearing Restrictions Per Provider Order: No      Mobility  Bed Mobility   Bed Mobility: Supine to Sit, Rolling, Sit to Supine Rolling: Used rails, Supervision   Supine to sit: Used rails, HOB elevated, Contact guard Sit to supine: Min assist   General bed mobility comments: Pt prefers to sit with bed elevated, pivots on bottom using BUE multiple hand changes from grasping sheet, to grasping rails, raises HOB to max height, slowly  moves each leg to bed edge, mod support to pull up to sitting. Slowly scoots to bed edge with max UE support. Min assist to place each leg back onto bed, HOB in max sitting position.    Transfers Overall transfer level: Needs assistance Equipment used: Rolling walker (2 wheels) Transfers: Sit to/from Stand Sit to Stand: Contact guard assist, Min assist, From elevated surface           General transfer comment: CGA - MIN A to rise from elevated bed height. Performssit to stand from [facility] with rail to assist.. Pt seated in recliner for ~ 5 minutes. Mod assist to power up from recliner with pillows to raise height.    Ambulation/Gait   Gait Distance (Feet): 20 Feet (then 62' then 20') Assistive device: Rolling walker (2 wheels) Gait Pattern/deviations: Step-to pattern Gait velocity: decr     General Gait Details: knees slightly bent,trunnk somewaht posterior to ambulate step at a time, frequent rest stops standing   Stairs             Wheelchair Mobility     Tilt Bed    Modified Rankin (Stroke Patients Only)       Balance Overall balance assessment: Needs assistance Sitting-balance support: Feet supported, Bilateral upper extremity supported Sitting balance-Leahy Scale: Fair Sitting balance - Comments: reliant on max support of the  UE's to decrease pressure through spine and right hip   Standing balance support: Bilateral upper extremity supported, During functional activity, Reliant on assistive device for balance Standing balance-Leahy Scale: Poor Standing balance comment:  max support on UE's  through RW                            Communication Communication Communication: No apparent difficulties  Cognition   Behavior During Therapy: WFL for tasks assessed/performed, Anxious, Restless   PT - Cognitive impairments: No apparent impairments                         Following commands: Intact      Cueing Cueing Techniques: Verbal cues  Exercises      General Comments        Pertinent Vitals/Pain Pain Assessment Faces Pain Scale: Hurts worst Pain Location: back Pain Descriptors / Indicators: Discomfort, Guarding, Constant, Grimacing Pain Intervention(s): Monitored during session, Premedicated before session, Repositioned, Patient requesting pain meds-RN notified, Limited activity within patient's tolerance    Home Living                          Prior Function            PT Goals (current goals can now be found in the care plan section) Progress towards PT goals: Progressing toward goals    Frequency    Min 3X/week      PT Plan      Co-evaluation              AM-PAC PT 6 Clicks Mobility   Outcome Measure  Help needed turning from your back to your side while in a flat bed without using bedrails?: A Little Help needed moving from lying on your back to sitting on the side of a flat bed without using bedrails?: A Lot Help needed moving to and from a bed to a chair (including a wheelchair)?: A Lot Help needed standing up from a chair using your arms (e.g., wheelchair or bedside chair)?: A Lot Help needed to walk in hospital room?: A Lot Help needed climbing 3-5 steps with a railing? : Total 6 Click Score: 12    End of Session Equipment Utilized During Treatment: Gait belt Activity Tolerance: Patient tolerated treatment well Patient left: in bed Nurse Communication: Mobility status PT Visit Diagnosis: Other abnormalities of gait and mobility (R26.89);Pain Pain - Right/Left: Right Pain -  part of body: Hip     Time: 1150-1220 PT Time Calculation (min) (ACUTE ONLY): 30 min  Charges:    $Gait Training: 23-37 PT General Charges $$ ACUTE PT VISIT: 1 Visit                     [name] PT Acute Rehabilitation Services Office [phone]    [name] 06/06/2024, 2:20 PM

## 2024-06-06 NOTE — Progress Notes (Signed)
 Inpatient Rehab Coordinator Note:  I spoke with pt over the phone to discuss CIR recommendations and goals/expectations of CIR stay.  We reviewed 3 hrs/day of therapy, physician follow up, and average length of stay 2 weeks (dependent upon progress) with goals of modified independent with PT/OT.  Pt lives with his spouse who has dementia and currently has caregivers during the day while he's hospitalized.  He is agreeable to proceed with insurance request for CIR prior auth and I will start that request today.   Reche Lowers, PT, DPT Admissions Coordinator [phone] 06/06/24  1:53 PM

## 2024-06-07 ENCOUNTER — Other Ambulatory Visit: Payer: Self-pay | Admitting: Hematology

## 2024-06-07 DIAGNOSIS — M549 Dorsalgia, unspecified: Secondary | ICD-10-CM | POA: Diagnosis not present

## 2024-06-07 LAB — GLUCOSE, CAPILLARY
Glucose-Capillary: 155 mg/dL — ABNORMAL HIGH (ref 70–99)
Glucose-Capillary: 187 mg/dL — ABNORMAL HIGH (ref 70–99)
Glucose-Capillary: 147 mg/dL — ABNORMAL HIGH (ref 70–99)
Glucose-Capillary: 155 mg/dL — ABNORMAL HIGH (ref 70–99)

## 2024-06-07 NOTE — Progress Notes (Signed)
 Physical Therapy Treatment Patient Details Name: Robert [name] MRN: [id number] DOB: [birth year] Today's Date: 06/07/2024   History of Present Illness 72 y.o. 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 Lumbar spine 8/7 revealed progressive collapse of L5 vertebral body with diffuse edema. Neurosurgery recommended non-surgical mgmt with TLSO, pain control and IV antibiotics. Pt with complex PMH and multiple recent hospitalizations including but not limited to: AFIB, ladder cancer s/p chemotherapy and cystoprostatectomy, right nephroureterectomy and total urethrectomy, AFIB, Type II DM, HLD, HTN, COVID-19, basal cell carcinoma, TEE, PICC placement.    PT Comments   Pt admitted with above diagnosis.  Pt currently with functional limitations due to the deficits listed below (see PT Problem List). PT arrived at 1045 and pt reported not having pain medication at that time, PT coordinated with nursing staff to maximize benefits or pharmaceuticals to address pain. PT returned ~ 1 hr later and OT completing intervention. Pt agreeable and indicated pain at rest 2/10 and with mobility 6/10. Pt required cues and increased time with use of hospital bed to complete bed mobility tasks, min A for B LE to return to bed, CGA for sit to stand  from elevated EOB, gait tasks in hallway with RW, CGA and cues, TLSO donned throughout intervention and pt able to doff at end of PT session once in bed. Pt ed provided during therapy tx session on back precautions, pt able to recall  no bending. PT wrote back precautions on white board in hopes of improved recall and observation. Pt left in bed and all needs in place. Patient will benefit from continued inpatient follow up therapy, <3 hours/day. Pt will benefit from acute skilled PT to increase their independence and safety with mobility to allow discharge.      If plan is discharge home, recommend the following: A lot of  help with walking and/or transfers;A lot of help with bathing/dressing/bathroom;Assistance with cooking/housework;Assist for transportation;Help with stairs or ramp for entrance   Can travel by private vehicle     No  Equipment Recommendations  None recommended by PT    Recommendations for Other Services       Precautions / Restrictions Precautions Precautions: Fall;Back Precaution Booklet Issued: Yes (comment) Recall of Precautions/Restrictions: Impaired Precaution/Restrictions Comments: custom TLSO in room. pt recalls 1/3 back precautions but cannot implement functionally (no bending, back precuations written on white board) Required Braces or Orthoses: Spinal Brace Restrictions Weight Bearing Restrictions Per Provider Order: No     Mobility  Bed Mobility Overal bed mobility: Needs Assistance Bed Mobility: Supine to Sit, Sit to Supine   Sidelying to sit: Used rails, HOB elevated, Supervision Supine to sit: Used rails, HOB elevated, Min assist     General bed mobility comments: pt has a specific technique he prefers to use with supine to sit utilizing the hospital bed, requiring increased time and min cues, pt required cues and min A for B LE to return to bed    Transfers Overall transfer level: Needs assistance Equipment used: Rolling walker (2 wheels) Transfers: Sit to/from Stand Sit to Stand: Contact guard assist, From elevated surface           General transfer comment: cues, pull to stand no overt LOB with static standing noted today    Ambulation/Gait Ambulation/Gait assistance: Contact guard assist Gait Distance (Feet): 90 Feet Assistive device: Rolling walker (2 wheels) Gait Pattern/deviations: Step-to pattern, Decreased step length - left Gait velocity: decreased  General Gait Details: slight B knee and hip flexion throughout gait cycle step almost through pattern with decreased stride length on the L cues for posture, safety, direction and RW  management, noted core instbility with postural like sway with TLSO donned   Stairs             Wheelchair Mobility     Tilt Bed    Modified Rankin (Stroke Patients Only)       Balance Overall balance assessment: Needs assistance Sitting-balance support: Feet supported Sitting balance-Leahy Scale: Good     Standing balance support: During functional activity, Reliant on assistive device for balance Standing balance-Leahy Scale: Fair Standing balance comment: static standing 1 UE support for short bout                            Communication Communication Communication: No apparent difficulties  Cognition Arousal: Alert Behavior During Therapy: WFL for tasks assessed/performed, Anxious, Restless   PT - Cognitive impairments: Memory, Sequencing, Problem solving                       PT - Cognition Comments: pt is unable to recall or observe back precuations with mobility tasks Following commands: Intact      Cueing Cueing Techniques: Verbal cues, Visual cues  Exercises      General Comments General comments (skin integrity, edema, etc.): Neighbor in room during session, overlap with PT for functional mobility      Pertinent Vitals/Pain Pain Assessment Pain Assessment: 0-10 Pain Score: 6  Pain Location: back Pain Descriptors / Indicators: Discomfort, Guarding, Constant, Grimacing Pain Intervention(s): Limited activity within patient's tolerance, Monitored during session, Premedicated before session, Repositioned    Home Living                          Prior Function            PT Goals (current goals can now be found in the care plan section) Acute Rehab PT Goals Patient Stated Goal: less pain PT Goal Formulation: With patient Time For Goal Achievement: 06/15/24 Potential to Achieve Goals: Fair Progress towards PT goals: Progressing toward goals    Frequency    Min 3X/week      PT Plan       Co-evaluation              AM-PAC PT 6 Clicks Mobility   Outcome Measure  Help needed turning from your back to your side while in a flat bed without using bedrails?: A Little Help needed moving from lying on your back to sitting on the side of a flat bed without using bedrails?: A Little Help needed moving to and from a bed to a chair (including a wheelchair)?: A Little Help needed standing up from a chair using your arms (e.g., wheelchair or bedside chair)?: A Little Help needed to walk in hospital room?: A Little Help needed climbing 3-5 steps with a railing? : Total 6 Click Score: 16    End of Session Equipment Utilized During Treatment: Gait belt Activity Tolerance: Patient tolerated treatment well Patient left: in bed Nurse Communication: Mobility status PT Visit Diagnosis: Other abnormalities of gait and mobility (R26.89);Pain Pain - Right/Left: Right Pain - part of body:  (back)     Time: 8845-8782 PT Time Calculation (min) (ACUTE ONLY): 23 min  Charges:    $Gait Training: 8-22 mins $  Therapeutic Activity: 8-22 mins PT General Charges $$ ACUTE PT VISIT: 1 Visit                     [place], PT Acute Rehab    [place] VEAR Drone 06/07/2024, 2:07 PM

## 2024-06-07 NOTE — Progress Notes (Signed)
 Occupational Therapy Treatment Patient Details Name: Robert [name] MRN: [id number] DOB: January 11, 1951 Today's Date: 06/07/2024   History of present illness 73 y.o. 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 Lumbar spine 8/7 revealed progressive collapse of L5 vertebral body with diffuse edema. Neurosurgery recommended non-surgical mgmt with TLSO, pain control and IV antibiotics. Pt with complex PMH and multiple recent hospitalizations including but not limited to: AFIB, ladder cancer s/p chemotherapy and cystoprostatectomy, right nephroureterectomy and total urethrectomy, AFIB, Type II DM, HLD, HTN, COVID-19, basal cell carcinoma, TEE, PICC placement.   OT comments  Pt seen for OT tx. Pt continues to be limited by pain despite pre-medication, decreased insight into deficits and only able to recall 1/3 back precautions, continues to require cues for safety throughout mobility efforts. Pt completes bed mobility with supervision and max use of bed features (pt self-directs OT to place [facility] at max height, uses rails and pivots hips to achieve transition from supine > sitting. TLSO donned with MIN A from OT in long sitting and adjusted while seated. Educated pt on use of AE for LB dressing - reacher, sock aide and leg lifter to maximize independence while maintaining back precautions. Pt performs UB bathing + dressing bed level, performs 2x STS with max cues for hand placement. Functional mobility with CGA ~10 ft in room, handoff to PT end of session for gait progression.   Pt will continue to benefit from therapy at hospital discharge. Pt is at a risk for future falls due to deficits in balance, cognition and awareness; does not have adequate support at home to provide necessary level of assist so continue to recommend inpatient therapy services. Pt would benefit from skilled OT services to address noted impairments and functional limitations (see below  for any additional details) in order to maximize safety and independence while minimizing falls risk and caregiver burden.       If plan is discharge home, recommend the following:  Two people to help with walking and/or transfers;Two people to help with bathing/dressing/bathroom;Assistance with cooking/housework;Direct supervision/assist for medications management;Direct supervision/assist for financial management;Assist for transportation;Help with stairs or ramp for entrance;Supervision due to cognitive status   Equipment Recommendations  None recommended by OT    Recommendations for Other Services Rehab consult    Precautions / Restrictions Precautions Precautions: Fall;Back Precaution Booklet Issued: Yes (comment) Recall of Precautions/Restrictions: Impaired Precaution/Restrictions Comments: custom TLSO in room. pt recalls 1/3 back precautions but cannot implement functionally Restrictions Weight Bearing Restrictions Per Provider Order: No       Mobility Bed Mobility Overal bed mobility: Needs Assistance Bed Mobility: Supine to Sit, Rolling, Sit to Supine Rolling: Used rails, Supervision Sidelying to sit: Used rails, HOB elevated, Supervision Supine to sit: Used rails, HOB elevated, Supervision Sit to supine: Supervision Sit to sidelying: Supervision General bed mobility comments: Pt prefers to sit with bed elevated, pivots on bottom using BUE multiple hand changes from grasping sheet, to grasping rails, raises HOB to max height, slowly  moves each leg to bed edge, mod support to pull up to sitting. slowwly scoots to bed edge. min assist to place each leg back onto bed, HOB in max sitting position.    Transfers Overall transfer level: Needs assistance Equipment used: Rolling walker (2 wheels) Transfers: Sit to/from Stand Sit to Stand: Contact guard assist, Min assist, From elevated surface           General transfer comment: bed significantly elevated. posterior  postural sway, pt requires cues for hand placement     Balance Overall balance assessment: Needs assistance Sitting-balance support: Feet supported, Bilateral upper extremity supported Sitting balance-Leahy Scale: Fair Sitting balance - Comments: reliant on max support of the UE's to decrease pressure through spine and right hip   Standing balance support: Bilateral upper extremity supported, During functional activity, Reliant on assistive device for balance Standing balance-Leahy Scale: Poor Standing balance comment: max support on UE's  through RW                           ADL either performed or assessed with clinical judgement   ADL Overall ADL's : Needs assistance/impaired         Upper Body Bathing: Bed level;Set up Upper Body Bathing Details (indicate cue type and reason): bed level due to soiled gown/need to don TLSO for mobility/pt preference     Upper Body Dressing : Minimal assistance;Sitting Upper Body Dressing Details (indicate cue type and reason): dons gown part bed level part seated (to maximize comfort and reduce pain per pt preference) Lower Body Dressing: Bed level;Maximal assistance Lower Body Dressing Details (indicate cue type and reason): MAX A to don socks bed level (OT providing edu on use of AE, bed level due to discomfort and neighbor entering room) Toilet Transfer: BSC/3in1;Comfort height toilet Toilet Transfer Details (indicate cue type and reason): adjustments made to [facility] over standard commode         Functional mobility during ADLs: Contact guard assist;Minimal assistance;Rolling walker (2 wheels);Cueing for safety;Cueing for sequencing General ADL Comments: Pt is able to don custom TLSO bed level with adjustments made in sitting/standing. Manges urostomy bag independently, but does not maintain back precautions     Communication Communication Communication: No apparent difficulties   Cognition Arousal: Alert Behavior During  Therapy: WFL for tasks assessed/performed, Anxious, Restless Cognition: Cognition impaired     Awareness: Intellectual awareness impaired   Attention impairment (select first level of impairment): Sustained attention Executive functioning impairment (select all impairments): Organization, Initiation, Sequencing, Reasoning, Problem solving OT - Cognition Comments: Pt with low frusteration tolerance; prefers to do things his own way (expect pain levels influence this) but is redirectable                 Following commands: Intact        Cueing   Cueing Techniques: Verbal cues  Exercises Exercises: Other exercises Other Exercises Other Exercises: pt provided reacher; discussed use of AE for LB dressing, anticipate pt will need max reinforcement and continued education to implement       General Comments Neighbor in room during session, overlap with PT for functional mobility    Pertinent Vitals/ Pain       Pain Assessment Pain Assessment: Faces Faces Pain Scale: Hurts whole lot Pain Location: back Pain Descriptors / Indicators: Discomfort, Guarding, Constant, Grimacing Pain Intervention(s): Limited activity within patient's tolerance, Monitored during session, Premedicated before session, Repositioned   Frequency  Min 3X/week        Progress Toward Goals  OT Goals(current goals can now be found in the care plan section)  Progress towards OT goals: Progressing toward goals  Acute Rehab OT Goals OT Goal Formulation: With patient Time For Goal Achievement: 06/19/24 Potential to Achieve Goals: Good ADL Goals Pt Will Perform Grooming: standing;with modified independence Pt Will Perform Lower Body Dressing: with adaptive equipment;sit to/from stand;with supervision Pt Will Transfer to Toilet: with contact guard  assist;ambulating;bedside commode;grab bars Pt Will Perform Toileting - Clothing Manipulation and hygiene: sitting/lateral leans;sit to/from stand;with  adaptive equipment  Plan         AM-PAC OT 6 Clicks Daily Activity     Outcome Measure   Help from another person eating meals?: None Help from another person taking care of personal grooming?: A Little Help from another person toileting, which includes using toliet, bedpan, or urinal?: A Lot Help from another person bathing (including washing, rinsing, drying)?: A Lot Help from another person to put on and taking off regular upper body clothing?: A Little Help from another person to put on and taking off regular lower body clothing?: A Lot 6 Click Score: 16    End of Session Equipment Utilized During Treatment: Rolling walker (2 wheels);Gait belt (TLSO)  OT Visit Diagnosis: Unsteadiness on feet (R26.81);Other abnormalities of gait and mobility (R26.89);Muscle weakness (generalized) (M62.81);Pain Pain - Right/Left: Right Pain - part of body:  (back)   Activity Tolerance Patient tolerated treatment well   Patient Left Other (comment) (handoff to PT)   Nurse Communication Mobility status;Precautions        Time: 8874-8793 OT Time Calculation (min): 41 min  Charges: OT General Charges $OT Visit: 1 Visit OT Treatments $Self Care/Home Management : 23-37 mins  Teauna Dubach L. Matthew Cina, OTR/L  06/07/24, 2:01 PM

## 2024-06-07 NOTE — Progress Notes (Signed)
 PROGRESS NOTE    Robert [name]  FMW:[id number] DOB: 07-07-1951 DOA: 05/29/2024 PCP: Garald Karlynn GAILS, MD   Brief Narrative:   73 y.o. male with PMH significant for DM2, HTN, HLD, paroxysmal A-fib on Eliquis , CKD 3, h/o bladder cancer s/p chemotherapy and cystoprostatectomy, right nephroureterectomy and total urethrectomy.   In early July, patient had L4-L5 discitis/osteomyelitis with phlegmon due to Enterococcus faecalis and was on treatment with continuous ampicillin  infusion pump. 8/6, patient presented to the ED with worsening back pain.   Lumbar spine MRI showed Progressive collapse of the L5 vertebral body with diffuse edema and enhancement in the L4 and L5 vertebral bodies, and progressive marrow signal change and enhancement within the S1 vertebral body. Findings are consistent with progressive disc osteomyelitis at L4-5 and progressively into the S1 vertebral body, Extensive paraspinous soft tissue enhancement and ventral epidural enhancement at L4 and L5, Stable lumbar disc disease at L1-2 and L2-3, Broad-based disc protrusion with moderate central and right foraminal stenosis at L3-4, similar to the prior study.    Thoracic spine MRI showed No evidence of discitis or other acute pathology in the thoracic spine and Shallow central disc protrusion at T8-9 without significant stenosis.  Pending placement to CIR On IV ampicillin  until 07/08/24.  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 ID and neurosurgery consulted.  Recommended non-surgical management with thoracolumbar brace and IV antibiotics Currently, he is on IV ampicillin  until 9/15 Patient unable to tolerate TLSO Pain regimen --- Scheduled: OxyContin  15 mg twice daily, gabapentin  300 mg 3 times daily, Lidoderm  patch --- PRN: Dilaudid  4 mg every 4 hours, IV Dilaudid  0.5 mg 4 hours Seen by PT OT.     Type 2 diabetes mellitus A1c 7.1 on 05/31/2024 PTA meds-weekly Ozempic  Continue SSI/Accu-Cheks Currently, he is on carb modified diet   Hypertension Blood pressure controlled on metoprolol    Paroxysmal A-fib Currently, rate controlled on Toprol  25 mg daily Continue chronic anticoagulation with apixaban  5 mg p.o. twice daily.   Hyperlipidemia Aortic atherosclerosis Continue atorvastatin  40 mg p.o. daily.   AKI on CKD 2, resolved Encouraged oral hydration.  Chronic normocytic anemia Hemoglobin stable  Bladder carcinoma status post robotic right nephrectomy, ureterectomy, radical cystectomy creation of ileal urostomy after resection of prostate urethra: No acute issues   Left hydronephrosis and ureteral stent in May 2025: urology follow up    Disposition: Pending Acute inpatient rehab (CIR)    DVT prophylaxis:  apixaban  (ELIQUIS ) tablet 5 mg     Code Status: Full Code Family Communication:  None at the bedside Status is: Inpatient Remains inpatient appropriate because: Pending CIR placement    Subjective:  He is feeling well. He did have bowel movement yesterday. He did walk. He is eager to work with PT today.   Examination:  General exam: Appears calm and comfortable  Respiratory system: Clear to auscultation. Respiratory effort normal. Cardiovascular system: S1 & S2 heard, RRR. No  JVD, murmurs, rubs, gallops or clicks. No pedal edema. Gastrointestinal system: Abdomen is nondistended,  soft and nontender. No organomegaly or masses felt. Normal bowel sounds heard. Central nervous system: Alert and oriented. No focal neurological deficits. Extremities: Symmetric 5 x 5 power. Skin: No rashes, lesions or ulcers Psychiatry: Judgement and insight appear normal. Mood & affect appropriate.      Diet Orders (From admission, onward)     Start     Ordered   05/30/24 0914  Diet heart healthy/carb modified Room service appropriate? Yes; Fluid consistency: Thin  Diet effective now       Question Answer Comment  Diet-HS Snack? Nothing   Room service appropriate? Yes   Fluid consistency: Thin      05/30/24 0913            Objective: Vitals:   06/06/24 0420 06/06/24 1312 06/06/24 1905 06/07/24 0449  BP: 115/81 (!) 124/59 (!) 151/79 124/61  Pulse: (!) 57 (!) 59 62 68  Resp: 16 18 20 15   Temp: 97.7 F (36.5 C) 97.7 F (36.5 C) (!) 97.4 F (36.3 C) 98.6 F (37 C)  TempSrc:  Oral    SpO2: 99% 100% 100% 98%  Weight:      Height:        Intake/Output Summary (Last 24 hours) at 06/07/2024 1039 Last data filed at 06/07/2024 0533 Gross per 24 hour  Intake --  Output 1000 ml  Net -1000 ml   Filed Weights   05/31/24 1036  Weight: 85.7 kg    Scheduled Meds:  apixaban   5 mg Oral BID   atorvastatin   40 mg Oral QHS   Chlorhexidine  Gluconate Cloth  6 each Topical Daily   feeding supplement  237 mL Oral BID BM   gabapentin   300 mg Oral TID   insulin  aspart  0-15 Units Subcutaneous TID WC   metoprolol  succinate  25 mg Oral QHS   oxyCODONE   15 mg Oral Q12H   polyethylene glycol  17 g Oral Daily   sodium chloride  flush  10-40 mL Intracatheter Q12H   Continuous Infusions:  ampicillin  (OMNIPEN) IV 2 g (06/07/24 0912)    Nutritional status     Body mass index is 23.62 kg/m.  Data Reviewed:   CBC: Recent Labs  Lab 06/03/24 0215 06/04/24 0437  WBC 6.3 5.8  NEUTROABS 4.0 3.6  HGB 9.8* 9.0*  HCT 30.4* 28.4*  MCV 82.8 83.5  PLT 177 150   Basic Metabolic  Panel: Recent Labs  Lab 06/03/24 0215 06/04/24 0437  NA 136 135  K 4.2 3.9  CL 100 104  CO2 27 25  GLUCOSE 143* 176*  BUN 20 19  CREATININE 1.46* 1.11  CALCIUM  8.9 8.6*   GFR: Estimated Creatinine Clearance: 70.8 mL/min (by C-G formula based on SCr of 1.11 mg/dL). Liver Function Tests: No results for input(s): AST, ALT, ALKPHOS, BILITOT, PROT, ALBUMIN in the last 168 hours.  No results for input(s): LIPASE, AMYLASE in the last 168 hours. No results for input(s): AMMONIA in the last 168 hours. Coagulation Profile: No results for input(s): INR, PROTIME in the last 168 hours. Cardiac Enzymes: No results for input(s): CKTOTAL, CKMB, CKMBINDEX, TROPONINI in the last 168 hours. BNP (last 3 results) No results for input(s): PROBNP in the last 8760 hours. HbA1C: No results for input(s): HGBA1C in the last 72 hours. CBG: Recent Labs  Lab 06/06/24 0743 06/06/24 1149 06/06/24 1637 06/06/24 2045 06/07/24 0725  GLUCAP 138* 199* 237* 135* 155*  Lipid Profile: No results for input(s): CHOL, HDL, LDLCALC, TRIG, CHOLHDL, LDLDIRECT in the last 72 hours. Thyroid  Function Tests: No results for input(s): TSH, T4TOTAL, FREET4, T3FREE, THYROIDAB in the last 72 hours. Anemia Panel: No results for input(s): VITAMINB12, FOLATE, FERRITIN, TIBC, IRON, RETICCTPCT in the last 72 hours. Sepsis Labs: No results for input(s): PROCALCITON, LATICACIDVEN in the last 168 hours.  Recent Results (from the past 240 hours)  Culture, blood (Routine X 2) w Reflex to ID Panel     Status: None   Collection Time: 05/31/24  4:38 AM   Specimen: BLOOD RIGHT ARM  Result Value Ref Range Status   Specimen Description   Final    BLOOD RIGHT ARM Performed at [facility], [address] [place], [place] [address]    Special Requests   Final    BOTTLES DRAWN AEROBIC ONLY Blood Culture results may not be optimal due to an inadequate  volume of blood received in culture bottles Performed at [facility], [address], [place], [place] [address]    Culture   Final    NO GROWTH 5 DAYS Performed at [facility], [address] [place], [place] [address]    Report Status 06/05/2024 FINAL  Final  Culture, blood (Routine X 2) w Reflex to ID Panel     Status: None   Collection Time: 05/31/24  4:44 AM   Specimen: BLOOD LEFT HAND  Result Value Ref Range Status   Specimen Description   Final    BLOOD LEFT HAND Performed at [facility], [address], [place], [place] [address]    Special Requests   Final    BOTTLES DRAWN AEROBIC ONLY Blood Culture results may not be optimal due to an inadequate volume of blood received in culture bottles Performed at [facility], [address], [place], [place] [address]    Culture   Final    NO GROWTH 5 DAYS Performed at [facility], [address], [place], [place] [address]    Report Status 06/05/2024 FINAL  Final       Radiology Studies: No results found.      LOS: 8 days   Time spent= 40 mins    Deliliah Room, MD [facility]  If 7PM-7AM, please contact night-coverage  06/07/2024, 10:39 AM

## 2024-06-07 NOTE — Progress Notes (Signed)
   Inpatient Rehabilitation Admissions Coordinator   We have received a denial from [facility] after peer to peer with Dr Babs and [facility] MD. I spoke with patient by phone and his is aware. We are not appealing that determination. He would like to speak to [facility] about options of SNF vs HH. I have alerted acute team and TOC. We will sign off.  Heron Leavell, RN, MSN Rehab Admissions Coordinator [phone] 06/07/2024 1:39 PM

## 2024-06-07 NOTE — Progress Notes (Signed)
 Inpatient Rehab Admissions Coordinator:   Awaiting insurance auth.  I will not likely have a bed for this patient to admit today or over the weekend.  Will follow.   Reche Lowers, PT, DPT Admissions Coordinator [phone] 06/07/24  6:25 AM

## 2024-06-07 NOTE — TOC Progression Note (Signed)
 Transition of Care [facility]) - Progression Note    Patient Details  Name: Robert [name] MRN: [id number] Date of Birth: Jul 04, 1951  [facility] CM/SW Contact  Sonda Manuella Quill, RN Phone Number: 06/07/2024, 2:44 PM  Clinical Narrative:    Ins denied auth for CIR; spoke w/ pt in room regarding SNF vs HH; pt said he would like time to think about the options; he requested that TOC return later today; will follow up w/ pt.   Expected Discharge Plan: Home w Home Health Services Barriers to Discharge: Continued Medical Work up               Expected Discharge Plan and Services In-house Referral: NA Discharge Planning Services: CM Consult Post Acute Care Choice: Home Health Living arrangements for the past 2 months: Single Family Home                 DME Arranged: N/A DME Agency: NA       HH Arranged: RN, IV Antibiotics HH Agency: [facility] Date HH Agency Contacted: 05/31/24 Time HH Agency Contacted: [phone] Representative spoke with at [facility] Agency: Amy   Social Drivers of Health (SDOH) Interventions SDOH Screenings   Food Insecurity: No Food Insecurity (05/30/2024)  Housing: High Risk (05/30/2024)  Transportation Needs: No Transportation Needs (05/30/2024)  Utilities: Not At Risk (05/30/2024)  Alcohol  Screen: Low Risk  (04/08/2024)  Depression (PHQ2-9): Low Risk  (05/09/2024)  Financial Resource Strain: Low Risk  (04/08/2024)  Physical Activity: Inactive (04/08/2024)  Social Connections: Moderately Integrated (05/31/2024)  Recent Concern: Social Connections - Moderately Isolated (03/28/2024)  Stress: Stress Concern Present (04/08/2024)  Tobacco Use: High Risk (05/31/2024)  Health Literacy: Adequate Health Literacy (04/08/2024)    Readmission Risk Interventions    04/01/2024    2:58 PM 02/07/2024    2:07 PM 12/10/2023    4:50 PM  Readmission Risk Prevention Plan  Transportation Screening Complete Complete Complete  PCP or Specialist Appt within 5-7  Days   Complete  PCP or Specialist Appt within 3-5 Days Complete Complete   Home Care Screening   Complete  Medication Review (RN CM)   Complete  HRI or Home Care Consult Complete Complete   Social Work Consult for Recovery Care Planning/Counseling Complete Complete   Palliative Care Screening Not Applicable Not Applicable   Medication Review Oceanographer) Complete Referral to Pharmacy

## 2024-06-08 DIAGNOSIS — M549 Dorsalgia, unspecified: Secondary | ICD-10-CM | POA: Diagnosis not present

## 2024-06-08 LAB — GLUCOSE, CAPILLARY
Glucose-Capillary: 134 mg/dL — ABNORMAL HIGH (ref 70–99)
Glucose-Capillary: 261 mg/dL — ABNORMAL HIGH (ref 70–99)
Glucose-Capillary: 195 mg/dL — ABNORMAL HIGH (ref 70–99)
Glucose-Capillary: 175 mg/dL — ABNORMAL HIGH (ref 70–99)

## 2024-06-08 NOTE — Progress Notes (Signed)
 PROGRESS NOTE    Robert [name]  FMW:[id number] DOB: 07/31/1951 DOA: 05/29/2024 PCP: Garald Karlynn GAILS, MD   Brief Narrative:   73 y.o. male with PMH significant for DM2, HTN, HLD, paroxysmal A-fib on Eliquis , CKD 3, h/o bladder cancer s/p chemotherapy and cystoprostatectomy, right nephroureterectomy and total urethrectomy.   In early July, patient had L4-L5 discitis/osteomyelitis with phlegmon due to Enterococcus faecalis and was on treatment with continuous ampicillin  infusion pump. 8/6, patient presented to the ED with worsening back pain.   Lumbar spine MRI showed Progressive collapse of the L5 vertebral body with diffuse edema and enhancement in the L4 and L5 vertebral bodies, and progressive marrow signal change and enhancement within the S1 vertebral body. Findings are consistent with progressive disc osteomyelitis at L4-5 and progressively into the S1 vertebral body, Extensive paraspinous soft tissue enhancement and ventral epidural enhancement at L4 and L5, Stable lumbar disc disease at L1-2 and L2-3, Broad-based disc protrusion with moderate central and right foraminal stenosis at L3-4, similar to the prior study.    Thoracic spine MRI showed No evidence of discitis or other acute pathology in the thoracic spine and Shallow central disc protrusion at T8-9 without significant stenosis.  Pending placement to SNF. Denies IPR by insurance. On IV ampicillin  until 07/08/24.  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 ID and neurosurgery consulted.  Recommended non-surgical management with thoracolumbar brace and IV antibiotics Currently, he is on IV ampicillin  until 9/15 Patient unable to tolerate TLSO Pain regimen --- Scheduled: OxyContin  15 mg twice daily, gabapentin  300 mg 3 times daily, Lidoderm  patch --- PRN: Dilaudid  4 mg every 4 hours, IV Dilaudid  0.5 mg 4 hours Seen by PT OT.     Type 2 diabetes mellitus A1c 7.1 on 05/31/2024 PTA meds-weekly Ozempic  Continue SSI/Accu-Cheks Currently, he is on carb modified diet   Hypertension Blood pressure controlled on metoprolol    Paroxysmal A-fib Currently, rate controlled on Toprol  25 mg daily Continue chronic anticoagulation with apixaban  5 mg p.o. twice daily.   Hyperlipidemia Aortic atherosclerosis Continue atorvastatin  40 mg p.o. daily.   AKI on CKD 2, resolved Encouraged oral hydration.  Chronic normocytic anemia Hemoglobin stable  Bladder carcinoma status post robotic right nephrectomy, ureterectomy, radical cystectomy creation of ileal urostomy after resection of prostate urethra: No acute issues   Left hydronephrosis and ureteral stent in May 2025: urology follow up    Disposition: Pending Acute inpatient rehab (CIR)    DVT prophylaxis:  apixaban  (ELIQUIS ) tablet 5 mg     Code Status: Full Code Family Communication:  None at the bedside Status is: Inpatient Remains inpatient appropriate because: Pending SNF placement    Subjective:  He continues to do well. He has concerns regarding out patient pain management and we spoke about that.   Examination:  General exam: Appears calm and comfortable  Respiratory system: Clear to auscultation. Respiratory effort normal. Cardiovascular system: S1 & S2 heard, RRR.  No JVD, murmurs, rubs, gallops or clicks. No pedal edema. Gastrointestinal system:  Abdomen is nondistended, soft and nontender. No organomegaly or masses felt. Normal bowel sounds heard. Central nervous system: Alert and oriented. No focal neurological deficits. Extremities: Symmetric 5 x 5 power. Skin: No rashes, lesions or ulcers Psychiatry: Judgement and insight appear normal. Mood & affect appropriate.      Diet Orders (From admission, onward)     Start     Ordered   05/30/24 0914  Diet heart healthy/carb modified Room service appropriate? Yes; Fluid consistency: Thin  Diet effective now       Question Answer Comment  Diet-HS Snack? Nothing   Room service appropriate? Yes   Fluid consistency: Thin      05/30/24 0913            Objective: Vitals:   06/07/24 0449 06/07/24 1200 06/07/24 2043 06/08/24 0452  BP: 124/61 132/65 130/70 (!) 122/59  Pulse: 68 62 67 62  Resp: 15 16 19 20   Temp: 98.6 F (37 C)  98 F (36.7 C) 98.1 F (36.7 C)  TempSrc:    Oral  SpO2: 98% 100% 99% 98%  Weight:      Height:        Intake/Output Summary (Last 24 hours) at 06/08/2024 1042 Last data filed at 06/08/2024 0141 Gross per 24 hour  Intake --  Output 1900 ml  Net -1900 ml   Filed Weights   05/31/24 1036  Weight: 85.7 kg    Scheduled Meds:  apixaban   5 mg Oral BID   atorvastatin   40 mg Oral QHS   Chlorhexidine  Gluconate Cloth  6 each Topical Daily   feeding supplement  237 mL Oral BID BM   gabapentin   300 mg Oral TID   insulin  aspart  0-15 Units Subcutaneous TID WC   metoprolol  succinate  25 mg Oral QHS   oxyCODONE   15 mg Oral Q12H   polyethylene glycol  17 g Oral Daily   sodium chloride  flush  10-40 mL Intracatheter Q12H   Continuous Infusions:  ampicillin  (OMNIPEN) IV 2 g (06/08/24 0847)    Nutritional status     Body mass index is 23.62 kg/m.  Data Reviewed:   CBC: Recent Labs  Lab 06/03/24 0215 06/04/24 0437  WBC 6.3 5.8  NEUTROABS 4.0 3.6  HGB 9.8* 9.0*  HCT 30.4* 28.4*  MCV 82.8 83.5  PLT 177 150   Basic Metabolic  Panel: Recent Labs  Lab 06/03/24 0215 06/04/24 0437  NA 136 135  K 4.2 3.9  CL 100 104  CO2 27 25  GLUCOSE 143* 176*  BUN 20 19  CREATININE 1.46* 1.11  CALCIUM  8.9 8.6*   GFR: Estimated Creatinine Clearance: 70.8 mL/min (by C-G formula based on SCr of 1.11 mg/dL). Liver Function Tests: No results for input(s): AST, ALT, ALKPHOS, BILITOT, PROT, ALBUMIN in the last 168 hours.  No results for input(s): LIPASE, AMYLASE in the last 168 hours. No results for input(s): AMMONIA in the last 168 hours. Coagulation Profile: No results for input(s): INR, PROTIME in the last 168 hours. Cardiac Enzymes: No results for input(s): CKTOTAL, CKMB, CKMBINDEX, TROPONINI in the last 168 hours. BNP (last 3 results) No results for input(s): PROBNP in the last 8760 hours. HbA1C: No results for input(s): HGBA1C in the last 72 hours. CBG: Recent Labs  Lab 06/07/24 0725 06/07/24 1155 06/07/24 1651 06/07/24 2151 06/08/24 0834  GLUCAP 155* 187* 147* 155* 134*   Lipid Profile: No results for  input(s): CHOL, HDL, LDLCALC, TRIG, CHOLHDL, LDLDIRECT in the last 72 hours. Thyroid  Function Tests: No results for input(s): TSH, T4TOTAL, FREET4, T3FREE, THYROIDAB in the last 72 hours. Anemia Panel: No results for input(s): VITAMINB12, FOLATE, FERRITIN, TIBC, IRON, RETICCTPCT in the last 72 hours. Sepsis Labs: No results for input(s): PROCALCITON, LATICACIDVEN in the last 168 hours.  Recent Results (from the past 240 hours)  Culture, blood (Routine X 2) w Reflex to ID Panel     Status: None   Collection Time: 05/31/24  4:38 AM   Specimen: BLOOD RIGHT ARM  Result Value Ref Range Status   Specimen Description   Final    BLOOD RIGHT ARM Performed at [facility], [address], [place], [place] [address]    Special Requests   Final    BOTTLES DRAWN AEROBIC ONLY Blood Culture results may not be optimal due to an inadequate  volume of blood received in culture bottles Performed at [facility], [address] [place], [place] [address]    Culture   Final    NO GROWTH 5 DAYS Performed at [facility], [address] [place], [place] [address]    Report Status 06/05/2024 FINAL  Final  Culture, blood (Routine X 2) w Reflex to ID Panel     Status: None   Collection Time: 05/31/24  4:44 AM   Specimen: BLOOD LEFT HAND  Result Value Ref Range Status   Specimen Description   Final    BLOOD LEFT HAND Performed at [facility], [address] [place], [place] [address]    Special Requests   Final    BOTTLES DRAWN AEROBIC ONLY Blood Culture results may not be optimal due to an inadequate volume of blood received in culture bottles Performed at [facility], [address] [place], [place] [address]    Culture   Final    NO GROWTH 5 DAYS Performed at [facility], [address], [place], [place] [address]    Report Status 06/05/2024 FINAL  Final       Radiology Studies: No results found.      LOS: 9 days   Time spent= 40 mins    Deliliah Room, MD [facility]  If 7PM-7AM, please contact night-coverage  06/08/2024, 10:42 AM

## 2024-06-08 NOTE — TOC Progression Note (Signed)
 Transition of Care [facility]) - Progression Note    Patient Details  Name: Robert [name] MRN: [id number] Date of Birth: 1951/05/15  Transition of [facility]) CM/SW Contact  Sonda Manuella Quill, RN Phone Number: 06/08/2024, 6:04 PM  Clinical Narrative:    [place] w/ pt in room; pt says he has decided on SNF; explained SNF process; pt verbalized understanding; he would like search limited to [facility]; pt also says he does not have facility of choice; will initiate process.   Expected Discharge Plan: Home w Home Health Services Barriers to Discharge: Continued Medical Work up               Expected Discharge Plan and Services In-house Referral: NA Discharge Planning Services: CM Consult Post Acute Care Choice: Home Health Living arrangements for the past 2 months: Single Family Home                 DME Arranged: N/A DME Agency: NA       HH Arranged: RN, IV Antibiotics HH Agency: [facility] Date HH Agency Contacted: 05/31/24 Time HH Agency Contacted: [phone] Representative spoke with at [facility] Agency: Amy   Social Drivers of Health (SDOH) Interventions SDOH Screenings   Food Insecurity: No Food Insecurity (05/30/2024)  Housing: High Risk (05/30/2024)  Transportation Needs: No Transportation Needs (05/30/2024)  Utilities: Not At Risk (05/30/2024)  Alcohol  Screen: Low Risk  (04/08/2024)  Depression (PHQ2-9): Low Risk  (05/09/2024)  Financial Resource Strain: Low Risk  (04/08/2024)  Physical Activity: Inactive (04/08/2024)  Social Connections: Moderately Integrated (05/31/2024)  Recent Concern: Social Connections - Moderately Isolated (03/28/2024)  Stress: Stress Concern Present (04/08/2024)  Tobacco Use: High Risk (05/31/2024)  Health Literacy: Adequate Health Literacy (04/08/2024)    Readmission Risk Interventions    04/01/2024    2:58 PM 02/07/2024    2:07 PM 12/10/2023    4:50 PM  Readmission Risk Prevention Plan  Transportation Screening Complete Complete  Complete  PCP or Specialist Appt within 5-7 Days   Complete  PCP or Specialist Appt within 3-5 Days Complete Complete   Home Care Screening   Complete  Medication Review (RN CM)   Complete  HRI or Home Care Consult Complete Complete   Social Work Consult for Recovery Care Planning/Counseling Complete Complete   Palliative Care Screening Not Applicable Not Applicable   Medication Review Oceanographer) Complete Referral to Pharmacy

## 2024-06-08 NOTE — Plan of Care (Signed)
  Problem: Education: Goal: Knowledge of the procedure and recovery process will improve Outcome: Progressing   Problem: Bowel/Gastric: Goal: Gastrointestinal status for postoperative course will improve Outcome: Progressing   Problem: Pain Management: Goal: General experience of comfort will improve Outcome: Progressing   Problem: Skin Integrity: Goal: Demonstration of wound healing without infection will improve Outcome: Progressing   Problem: Urinary Elimination: Goal: Ability to avoid or minimize complications of infection will improve Outcome: Progressing Goal: Ability to achieve and maintain urine output will improve Outcome: Progressing Goal: Home care management will improve Outcome: Progressing   Problem: Education: Goal: Knowledge of General Education information will improve Description: Including pain rating scale, medication(s)/side effects and non-pharmacologic comfort measures Outcome: Progressing   Problem: Health Behavior/Discharge Planning: Goal: Ability to manage health-related needs will improve Outcome: Progressing   Problem: Clinical Measurements: Goal: Ability to maintain clinical measurements within normal limits will improve Outcome: Progressing Goal: Will remain free from infection Outcome: Progressing Goal: Diagnostic test results will improve Outcome: Progressing Goal: Respiratory complications will improve Outcome: Progressing Goal: Cardiovascular complication will be avoided Outcome: Progressing   Problem: Activity: Goal: Risk for activity intolerance will decrease Outcome: Progressing   Problem: Nutrition: Goal: Adequate nutrition will be maintained Outcome: Progressing   Problem: Elimination: Goal: Will not experience complications related to bowel motility Outcome: Progressing Goal: Will not experience complications related to urinary retention Outcome: Progressing

## 2024-06-08 NOTE — Plan of Care (Signed)
  Problem: Education: Goal: Knowledge of the procedure and recovery process will improve Outcome: Progressing   Problem: Bowel/Gastric: Goal: Gastrointestinal status for postoperative course will improve Outcome: Progressing   Problem: Pain Management: Goal: General experience of comfort will improve Outcome: Progressing   Problem: Skin Integrity: Goal: Demonstration of wound healing without infection will improve Outcome: Progressing   Problem: Urinary Elimination: Goal: Ability to avoid or minimize complications of infection will improve Outcome: Progressing Goal: Ability to achieve and maintain urine output will improve Outcome: Progressing Goal: Home care management will improve Outcome: Progressing   Problem: Education: Goal: Knowledge of General Education information will improve Description: Including pain rating scale, medication(s)/side effects and non-pharmacologic comfort measures Outcome: Progressing   Problem: Health Behavior/Discharge Planning: Goal: Ability to manage health-related needs will improve Outcome: Progressing   Problem: Clinical Measurements: Goal: Ability to maintain clinical measurements within normal limits will improve Outcome: Progressing Goal: Will remain free from infection Outcome: Progressing Goal: Diagnostic test results will improve Outcome: Progressing Goal: Respiratory complications will improve Outcome: Progressing Goal: Cardiovascular complication will be avoided Outcome: Progressing   Problem: Activity: Goal: Risk for activity intolerance will decrease Outcome: Progressing   Problem: Nutrition: Goal: Adequate nutrition will be maintained Outcome: Progressing   Problem: Coping: Goal: Level of anxiety will decrease Outcome: Progressing   Problem: Elimination: Goal: Will not experience complications related to bowel motility Outcome: Progressing Goal: Will not experience complications related to urinary  retention Outcome: Progressing   Problem: Pain Managment: Goal: General experience of comfort will improve and/or be controlled Outcome: Progressing   Problem: Safety: Goal: Ability to remain free from injury will improve Outcome: Progressing   Problem: Skin Integrity: Goal: Risk for impaired skin integrity will decrease Outcome: Progressing   Problem: Education: Goal: Ability to describe self-care measures that may prevent or decrease complications (Diabetes Survival Skills Education) will improve Outcome: Progressing Goal: Individualized Educational Video(s) Outcome: Progressing   Problem: Coping: Goal: Ability to adjust to condition or change in health will improve Outcome: Progressing   Problem: Fluid Volume: Goal: Ability to maintain a balanced intake and output will improve Outcome: Progressing   Problem: Health Behavior/Discharge Planning: Goal: Ability to identify and utilize available resources and services will improve Outcome: Progressing Goal: Ability to manage health-related needs will improve Outcome: Progressing   Problem: Metabolic: Goal: Ability to maintain appropriate glucose levels will improve Outcome: Progressing   Problem: Nutritional: Goal: Maintenance of adequate nutrition will improve Outcome: Progressing Goal: Progress toward achieving an optimal weight will improve Outcome: Progressing   Problem: Skin Integrity: Goal: Risk for impaired skin integrity will decrease Outcome: Progressing   Problem: Tissue Perfusion: Goal: Adequacy of tissue perfusion will improve Outcome: Progressing

## 2024-06-08 NOTE — Plan of Care (Signed)
  Problem: Education: Goal: Knowledge of the procedure and recovery process will improve Outcome: Progressing   Problem: Bowel/Gastric: Goal: Gastrointestinal status for postoperative course will improve Outcome: Progressing   Problem: Pain Management: Goal: General experience of comfort will improve Outcome: Progressing   Problem: Skin Integrity: Goal: Demonstration of wound healing without infection will improve Outcome: Progressing   Problem: Education: Goal: Knowledge of General Education information will improve Description: Including pain rating scale, medication(s)/side effects and non-pharmacologic comfort measures Outcome: Progressing   Problem: Clinical Measurements: Goal: Will remain free from infection Outcome: Progressing Goal: Diagnostic test results will improve Outcome: Progressing   Problem: Activity: Goal: Risk for activity intolerance will decrease Outcome: Progressing   Problem: Nutrition: Goal: Adequate nutrition will be maintained Outcome: Progressing   Problem: Coping: Goal: Level of anxiety will decrease Outcome: Progressing

## 2024-06-09 DIAGNOSIS — M549 Dorsalgia, unspecified: Secondary | ICD-10-CM | POA: Diagnosis not present

## 2024-06-09 LAB — GLUCOSE, CAPILLARY
Glucose-Capillary: 125 mg/dL — ABNORMAL HIGH (ref 70–99)
Glucose-Capillary: 244 mg/dL — ABNORMAL HIGH (ref 70–99)
Glucose-Capillary: 184 mg/dL — ABNORMAL HIGH (ref 70–99)
Glucose-Capillary: 180 mg/dL — ABNORMAL HIGH (ref 70–99)

## 2024-06-09 NOTE — Progress Notes (Signed)
 Mobility Specialist - Progress Note   06/09/24 1300  Mobility  Activity Ambulated with assistance  Level of Assistance Contact guard assist, steadying assist  Assistive Device Front wheel walker  Distance Ambulated (ft) 90 ft  Range of Motion/Exercises Active  Activity Response Tolerated well  Mobility Referral Yes  Mobility visit 1 Mobility  Mobility Specialist Start Time (ACUTE ONLY) 1240  Mobility Specialist Stop Time (ACUTE ONLY) 1257  Mobility Specialist Time Calculation (min) (ACUTE ONLY) 17 min   Received in bed and agreed to mobility. Applied TLSO, ambulated down the hall and returned to chair with all needs met.  Cyndee Ada Mobility Specialist

## 2024-06-09 NOTE — Progress Notes (Signed)
 PROGRESS NOTE    Robert [name]  FMW:[id number] DOB: [birth year] DOA: 05/29/2024 PCP: Garald Karlynn GAILS, MD   Brief Narrative:   73 y.o. male with PMH significant for DM2, HTN, HLD, paroxysmal A-fib on Eliquis , CKD 3, h/o bladder cancer s/p chemotherapy and cystoprostatectomy, right nephroureterectomy and total urethrectomy.   In early July, patient had L4-L5 discitis/osteomyelitis with phlegmon due to Enterococcus faecalis and was on treatment with continuous ampicillin  infusion pump. 8/6, patient presented to the ED with worsening back pain.   Lumbar spine MRI showed Progressive collapse of the L5 vertebral body with diffuse edema and enhancement in the L4 and L5 vertebral bodies, and progressive marrow signal change and enhancement within the S1 vertebral body. Findings are consistent with progressive disc osteomyelitis at L4-5 and progressively into the S1 vertebral body, Extensive paraspinous soft tissue enhancement and ventral epidural enhancement at L4 and L5, Stable lumbar disc disease at L1-2 and L2-3, Broad-based disc protrusion with moderate central and right foraminal stenosis at L3-4, similar to the prior study.    Thoracic spine MRI showed No evidence of discitis or other acute pathology in the thoracic spine and Shallow central disc protrusion at T8-9 without significant stenosis.  Pending placement to SNF. Denies IPR by insurance. On IV ampicillin  until 07/08/24.  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 ID and neurosurgery consulted.  Recommended non-surgical management with thoracolumbar brace and IV antibiotics Currently, he is on IV ampicillin  until 9/15 Patient unable to tolerate TLSO Pain regimen --- Scheduled: OxyContin  15 mg twice daily, gabapentin  300 mg 3 times daily, Lidoderm  patch --- PRN: Dilaudid  4 mg every 4 hours, IV Dilaudid  0.5 mg 4 hours Seen by PT OT.     Type 2 diabetes mellitus A1c 7.1 on 05/31/2024 PTA meds-weekly Ozempic  Continue SSI/Accu-Cheks Currently, he is on carb modified diet   Hypertension Blood pressure controlled on metoprolol    Paroxysmal A-fib Currently, rate controlled on Toprol  25 mg daily Continue chronic anticoagulation with apixaban  5 mg p.o. twice daily.   Hyperlipidemia Aortic atherosclerosis Continue atorvastatin  40 mg p.o. daily.   AKI on CKD 2, resolved Encouraged oral hydration.  Chronic normocytic anemia Hemoglobin stable  Bladder carcinoma status post robotic right nephrectomy, ureterectomy, radical cystectomy creation of ileal urostomy after resection of prostate urethra: No acute issues   Left hydronephrosis and ureteral stent in May 2025: urology follow up    Disposition: Pending SNF. Denied for IPR by insurance.    DVT prophylaxis:  apixaban  (ELIQUIS ) tablet 5 mg     Code Status: Full Code Family Communication:  None at the bedside Status is: Inpatient Remains inpatient appropriate because: Pending SNF placement    Subjective:  No acute events overnight. Pain is well controlled on current regimen.   Examination:  General exam: Appears calm and comfortable  Respiratory system: Clear to auscultation. Respiratory effort normal. Cardiovascular system: S1 & S2 heard, RRR. No JVD, murmurs, rubs, gallops  or clicks. No pedal edema. Gastrointestinal system: Abdomen is nondistended,  soft and nontender. No organomegaly or masses felt. Normal bowel sounds heard. Central nervous system: Alert and oriented. No focal neurological deficits. Extremities: Symmetric 5 x 5 power. Skin: No rashes, lesions or ulcers Psychiatry: Judgement and insight appear normal. Mood & affect appropriate.      Diet Orders (From admission, onward)     Start     Ordered   05/30/24 0914  Diet heart healthy/carb modified Room service appropriate? Yes; Fluid consistency: Thin  Diet effective now       Question Answer Comment  Diet-HS Snack? Nothing   Room service appropriate? Yes   Fluid consistency: Thin      05/30/24 0913            Objective: Vitals:   06/08/24 1234 06/08/24 2115 06/08/24 2115 06/09/24 0600  BP: (!) 145/76 111/72 111/72 128/65  Pulse: 68 78 81 64  Resp: 16  18 18   Temp: 98.1 F (36.7 C)  98.1 F (36.7 C)   TempSrc: Oral  Oral   SpO2: 99%  98%   Weight:      Height:        Intake/Output Summary (Last 24 hours) at 06/09/2024 0917 Last data filed at 06/09/2024 9378 Gross per 24 hour  Intake 6035 ml  Output 2650 ml  Net 3385 ml   Filed Weights   05/31/24 1036  Weight: 85.7 kg    Scheduled Meds:  apixaban   5 mg Oral BID   atorvastatin   40 mg Oral QHS   Chlorhexidine  Gluconate Cloth  6 each Topical Daily   feeding supplement  237 mL Oral BID BM   gabapentin   300 mg Oral TID   insulin  aspart  0-15 Units Subcutaneous TID WC   metoprolol  succinate  25 mg Oral QHS   oxyCODONE   15 mg Oral Q12H   polyethylene glycol  17 g Oral Daily   sodium chloride  flush  10-40 mL Intracatheter Q12H   Continuous Infusions:  ampicillin  (OMNIPEN) IV 2 g (06/09/24 0906)    Nutritional status     Body mass index is 23.62 kg/m.  Data Reviewed:   CBC: Recent Labs  Lab 06/03/24 0215 06/04/24 0437  WBC 6.3 5.8  NEUTROABS 4.0 3.6  HGB 9.8* 9.0*  HCT 30.4* 28.4*  MCV 82.8 83.5  PLT 177 150   Basic Metabolic Panel: Recent Labs  Lab 06/03/24 0215  06/04/24 0437  NA 136 135  K 4.2 3.9  CL 100 104  CO2 27 25  GLUCOSE 143* 176*  BUN 20 19  CREATININE 1.46* 1.11  CALCIUM  8.9 8.6*   GFR: Estimated Creatinine Clearance: 70.8 mL/min (by C-G formula based on SCr of 1.11 mg/dL). Liver Function Tests: No results for input(s): AST, ALT, ALKPHOS, BILITOT, PROT, ALBUMIN in the last 168 hours.  No results for input(s): LIPASE, AMYLASE in the last 168 hours. No results for input(s): AMMONIA in the last 168 hours. Coagulation Profile: No results for input(s): INR, PROTIME in the last 168 hours. Cardiac Enzymes: No results for input(s): CKTOTAL, CKMB, CKMBINDEX, TROPONINI in the last 168 hours. BNP (last 3 results) No results for input(s): PROBNP in the last 8760 hours. HbA1C: No results for input(s): HGBA1C in the last 72 hours. CBG: Recent Labs  Lab 06/08/24 0834 06/08/24 1123 06/08/24 1652 06/08/24 2152 06/09/24 0755  GLUCAP 134* 261* 195* 175* 125*   Lipid Profile: No results for input(s): CHOL, HDL, LDLCALC, TRIG, CHOLHDL, LDLDIRECT  in the last 72 hours. Thyroid  Function Tests: No results for input(s): TSH, T4TOTAL, FREET4, T3FREE, THYROIDAB in the last 72 hours. Anemia Panel: No results for input(s): VITAMINB12, FOLATE, FERRITIN, TIBC, IRON, RETICCTPCT in the last 72 hours. Sepsis Labs: No results for input(s): PROCALCITON, LATICACIDVEN in the last 168 hours.  Recent Results (from the past 240 hours)  Culture, blood (Routine X 2) w Reflex to ID Panel     Status: None   Collection Time: 05/31/24  4:38 AM   Specimen: BLOOD RIGHT ARM  Result Value Ref Range Status   Specimen Description   Final    BLOOD RIGHT ARM Performed at [facility], [address] [place], [place] [address]    Special Requests   Final    BOTTLES DRAWN AEROBIC ONLY Blood Culture results may not be optimal due to an inadequate volume of blood received in culture  bottles Performed at [facility], [address] [place], [place] [address]    Culture   Final    NO GROWTH 5 DAYS Performed at [facility], [address], [place], [place] [address]    Report Status 06/05/2024 FINAL  Final  Culture, blood (Routine X 2) w Reflex to ID Panel     Status: None   Collection Time: 05/31/24  4:44 AM   Specimen: BLOOD LEFT HAND  Result Value Ref Range Status   Specimen Description   Final    BLOOD LEFT HAND Performed at [facility], [address], [place], [place] [address]    Special Requests   Final    BOTTLES DRAWN AEROBIC ONLY Blood Culture results may not be optimal due to an inadequate volume of blood received in culture bottles Performed at [facility], [address], [place], [place] [address]    Culture   Final    NO GROWTH 5 DAYS Performed at [facility], [address], [place], [place] [address]    Report Status 06/05/2024 FINAL  Final       Radiology Studies: No results found.      LOS: 10 days   Time spent= 39 mins    Deliliah Room, MD [facility]  If 7PM-7AM, please contact night-coverage  06/09/2024, 9:17 AM

## 2024-06-09 NOTE — [place] FL2 (Signed)
 [place]  MEDICAID FL2 LEVEL OF CARE FORM     IDENTIFICATION  Patient Name: Robert [name] Birthdate: 25-Dec-1950 Sex: male Admission Date (Current Location): 05/29/2024  [facility] and [place] Number:  Producer, television/film/video and Address:  [facility],  [address], [place] [address]      Provider Number: [id number]  Attending Physician Name and Address:  Dino Antu, MD  Relative Name and Phone Number:  Jame Morrell (son) [phone]    Current Level of Care: Hospital Recommended Level of Care: Skilled Nursing Facility Prior Approval Number:    Date Approved/Denied:   PASRR Number: [id number]  Discharge Plan: SNF    Current Diagnoses: Patient Active Problem List   Diagnosis Date Noted   Intractable back pain 05/30/2024   Normocytic anemia 05/30/2024   Vertebral osteomyelitis (HCC) 05/30/2024   Aortic atherosclerosis (HCC) 05/30/2024   Diskitis 05/10/2024   Low back pain 04/17/2024   Lumbar radiculopathy 03/28/2024   Hyponatremia 03/28/2024   CKD (chronic kidney disease), stage III (HCC) 03/28/2024   H/O left nephrectomy 03/19/2024   Neuropathy 03/19/2024   AKI (acute kidney injury) (HCC) 03/01/2024   Serratia marcescens infection 03/01/2024   Chills 02/29/2024   Medication management 02/29/2024   Hydronephrosis of left kidney 02/05/2024   Complicated UTI (urinary tract infection) 02/05/2024   Bacteremia 02/04/2024   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  10/09/2023   Hypomagnesemia 07/03/2023   Port-A-Cath in place 06/09/2023   Elevated PSA measurement 02/28/2023   Preop examination 02/27/2023   Osteoarthritis of right hip 06/29/2022   Bladder cancer (HCC) 01/31/2022   Primary osteoarthritis of right hip 10/19/2021   Long term current use of anticoagulant -Eliquis  03/12/2019   Paroxysmal A-fib (HCC) 03/12/2019   Overweight (BMI 25.0-29.9) 01/03/2019   Type 2 diabetes mellitus without complication, without long-term current use of insulin  (HCC)  07/23/2018   BPH associated with nocturia 03/01/2017   Irregular heart rate 06/23/2011   Hx of colonic polyps    ERECTILE DYSFUNCTION 12/19/2008   SKIN CANCER, HX OF 12/19/2008   Hyperlipidemia 08/21/2007   Essential hypertension 08/21/2007    Orientation RESPIRATION BLADDER Height & Weight     Self, Time, Situation  Normal Urostomy Weight: 85.7 kg Height:  6' 3 (190.5 cm)  BEHAVIORAL SYMPTOMS/MOOD NEUROLOGICAL BOWEL NUTRITION STATUS      Continent Diet (heart healthy, carb modified)  AMBULATORY STATUS COMMUNICATION OF NEEDS Skin   Limited Assist Verbally Normal                       Personal Care Assistance Level of Assistance  Bathing, Feeding Bathing Assistance: Maximum assistance Feeding assistance: Independent Dressing Assistance: Limited assistance     Functional Limitations Info  Sight, Hearing, Speech Sight Info: Impaired (glasses) Hearing Info: Adequate Speech Info: Adequate    SPECIAL CARE FACTORS FREQUENCY  PT (By licensed PT), OT (By licensed OT)     PT Frequency: 5x/week OT Frequency: 5x/week            Contractures Contractures Info: Not present    Additional Factors Info  Code Status, Allergies, Insulin  Sliding Scale Code Status Info: Full Allergies Info: Lisinopril , Lyrica  (Pregabalin )   Insulin  Sliding Scale Info: See MAR       Current Medications (06/09/2024):  This is the current hospital active medication list Current Facility-Administered Medications  Medication Dose Route Frequency Provider Last Rate Last Admin   acetaminophen  (TYLENOL ) tablet 650 mg  650 mg Oral Q6H PRN Celinda Alm Lot,  MD       Or   acetaminophen  (TYLENOL ) suppository 650 mg  650 mg Rectal Q6H PRN Celinda Alm Lot, MD       ampicillin  (OMNIPEN) 2 g in sodium chloride  0.9 % 100 mL IVPB  2 g Intravenous Q4H Celinda Alm Lot, MD 300 mL/hr at 06/09/24 1626 2 g at 06/09/24 1626   apixaban  (ELIQUIS ) tablet 5 mg  5 mg Oral BID Celinda Alm Lot, MD   5  mg at 06/09/24 9095   atorvastatin  (LIPITOR) tablet 40 mg  40 mg Oral QHS Celinda Alm Lot, MD   40 mg at 06/08/24 2114   Chlorhexidine  Gluconate Cloth 2 % PADS 6 each  6 each Topical Daily Celinda Alm Lot, MD   6 each at 06/09/24 0904   feeding supplement (ENSURE PLUS HIGH PROTEIN) liquid 237 mL  237 mL Oral BID BM Rosario Eland I, MD   237 mL at 06/09/24 1344   gabapentin  (NEURONTIN ) capsule 300 mg  300 mg Oral TID Celinda Alm Lot, MD   300 mg at 06/09/24 1625   guaiFENesin -dextromethorphan  (ROBITUSSIN DM) 100-10 MG/5ML syrup 5 mL  5 mL Oral Q4H PRN Chavez, Abigail, NP   5 mL at 06/07/24 2156   HYDROmorphone  (DILAUDID ) injection 0.5 mg  0.5 mg Intravenous Q4H PRN Dahal, Chapman, MD   0.5 mg at 06/09/24 1625   HYDROmorphone  (DILAUDID ) tablet 4 mg  4 mg Oral Q4H PRN Arlice Chapman, MD   4 mg at 06/08/24 1813   insulin  aspart (novoLOG ) injection 0-15 Units  0-15 Units Subcutaneous TID WC Celinda Alm Lot, MD   3 Units at 06/09/24 1625   lidocaine  (LIDODERM ) 5 % 1 patch  1 patch Transdermal Daily PRN Celinda Alm Lot, MD   1 patch at 06/02/24 1003   metoprolol  succinate (TOPROL -XL) 24 hr tablet 25 mg  25 mg Oral QHS Celinda Alm Lot, MD   25 mg at 06/08/24 2115   ondansetron  (ZOFRAN ) tablet 4 mg  4 mg Oral Q6H PRN Celinda Alm Lot, MD       Or   ondansetron  (ZOFRAN ) injection 4 mg  4 mg Intravenous Q6H PRN Celinda Alm Lot, MD       oxyCODONE  (Oxy IR/ROXICODONE ) immediate release tablet 5 mg  5 mg Oral Q6H PRN Dahal, Chapman, MD   5 mg at 06/08/24 1255   oxyCODONE  (OXYCONTIN ) 12 hr tablet 15 mg  15 mg Oral Q12H Dahal, Binaya, MD   15 mg at 06/09/24 0904   polyethylene glycol (MIRALAX  / GLYCOLAX ) packet 17 g  17 g Oral Daily Rashid, Farhan, MD   17 g at 06/09/24 9095   senna-docusate (Senokot-S) tablet 1-2 tablet  1-2 tablet Oral BID BM PRN Celinda Alm Lot, MD   2 tablet at 06/07/24 2157   sodium chloride  flush (NS) 0.9 % injection 10-40 mL  10-40 mL Intracatheter Q12H  Ortiz, David Manuel, MD   10 mL at 06/09/24 9095   sodium chloride  flush (NS) 0.9 % injection 10-40 mL  10-40 mL Intracatheter PRN Celinda Alm Lot, MD       Facility-Administered Medications Ordered in Other Encounters  Medication Dose Route Frequency Provider Last Rate Last Admin   sodium chloride  flush (NS) 0.9 % injection 10 mL  10 mL Intracatheter PRN Lanny Callander, MD   10 mL at 02/15/24 1353     Discharge Medications: Please see discharge summary for a list of discharge medications.  Relevant Imaging Results:  Relevant Lab Results:  Additional Information SSN [id number]  Sonda Manuella Quill, RN

## 2024-06-09 NOTE — Plan of Care (Signed)
  Problem: Education: Goal: Knowledge of the procedure and recovery process will improve Outcome: Progressing   Problem: Bowel/Gastric: Goal: Gastrointestinal status for postoperative course will improve Outcome: Progressing   Problem: Pain Management: Goal: General experience of comfort will improve Outcome: Progressing   Problem: Skin Integrity: Goal: Demonstration of wound healing without infection will improve Outcome: Progressing   Problem: Urinary Elimination: Goal: Ability to avoid or minimize complications of infection will improve Outcome: Progressing Goal: Ability to achieve and maintain urine output will improve Outcome: Progressing Goal: Home care management will improve Outcome: Progressing   Problem: Education: Goal: Knowledge of General Education information will improve Description: Including pain rating scale, medication(s)/side effects and non-pharmacologic comfort measures Outcome: Progressing   Problem: Health Behavior/Discharge Planning: Goal: Ability to manage health-related needs will improve Outcome: Progressing   Problem: Clinical Measurements: Goal: Ability to maintain clinical measurements within normal limits will improve Outcome: Progressing Goal: Will remain free from infection Outcome: Progressing Goal: Diagnostic test results will improve Outcome: Progressing Goal: Respiratory complications will improve Outcome: Progressing Goal: Cardiovascular complication will be avoided Outcome: Progressing   Problem: Activity: Goal: Risk for activity intolerance will decrease Outcome: Progressing   Problem: Nutrition: Goal: Adequate nutrition will be maintained Outcome: Progressing   Problem: Coping: Goal: Level of anxiety will decrease Outcome: Progressing   Problem: Elimination: Goal: Will not experience complications related to bowel motility Outcome: Progressing Goal: Will not experience complications related to urinary  retention Outcome: Progressing   Problem: Pain Managment: Goal: General experience of comfort will improve and/or be controlled Outcome: Progressing   Problem: Safety: Goal: Ability to remain free from injury will improve Outcome: Progressing   Problem: Skin Integrity: Goal: Risk for impaired skin integrity will decrease Outcome: Progressing   Problem: Education: Goal: Ability to describe self-care measures that may prevent or decrease complications (Diabetes Survival Skills Education) will improve Outcome: Progressing Goal: Individualized Educational Video(s) Outcome: Progressing   Problem: Coping: Goal: Ability to adjust to condition or change in health will improve Outcome: Progressing   Problem: Fluid Volume: Goal: Ability to maintain a balanced intake and output will improve Outcome: Progressing   Problem: Health Behavior/Discharge Planning: Goal: Ability to identify and utilize available resources and services will improve Outcome: Progressing Goal: Ability to manage health-related needs will improve Outcome: Progressing   Problem: Metabolic: Goal: Ability to maintain appropriate glucose levels will improve Outcome: Progressing   Problem: Nutritional: Goal: Maintenance of adequate nutrition will improve Outcome: Progressing Goal: Progress toward achieving an optimal weight will improve Outcome: Progressing   Problem: Skin Integrity: Goal: Risk for impaired skin integrity will decrease Outcome: Progressing   Problem: Tissue Perfusion: Goal: Adequacy of tissue perfusion will improve Outcome: Progressing

## 2024-06-09 NOTE — TOC Progression Note (Signed)
 Transition of Care [facility]) - Progression Note    Patient Details  Name: Robert [name] MRN: [id number] Date of Birth: 1951/10/10  Transition of Care [facility]) CM/SW Contact  Sonda Manuella Quill, RN Phone Number: 06/09/2024, 6:51 PM  Clinical Narrative:    Faxed out for bed offers; awaiting bed offers; ins auth.   Expected Discharge Plan: Home w Home Health Services Barriers to Discharge: Continued Medical Work up               Expected Discharge Plan and Services In-house Referral: NA Discharge Planning Services: CM Consult Post Acute Care Choice: Home Health Living arrangements for the past 2 months: Single Family Home                 DME Arranged: N/A DME Agency: NA       HH Arranged: RN, IV Antibiotics HH Agency: [facility] Date HH Agency Contacted: 05/31/24 Time HH Agency Contacted: [phone] Representative spoke with at [facility] Agency: Amy   Social Drivers of Health (SDOH) Interventions SDOH Screenings   Food Insecurity: No Food Insecurity (05/30/2024)  Housing: High Risk (05/30/2024)  Transportation Needs: No Transportation Needs (05/30/2024)  Utilities: Not At Risk (05/30/2024)  Alcohol  Screen: Low Risk  (04/08/2024)  Depression (PHQ2-9): Low Risk  (05/09/2024)  Financial Resource Strain: Low Risk  (04/08/2024)  Physical Activity: Inactive (04/08/2024)  Social Connections: Moderately Integrated (05/31/2024)  Recent Concern: Social Connections - Moderately Isolated (03/28/2024)  Stress: Stress Concern Present (04/08/2024)  Tobacco Use: High Risk (05/31/2024)  Health Literacy: Adequate Health Literacy (04/08/2024)    Readmission Risk Interventions    04/01/2024    2:58 PM 02/07/2024    2:07 PM 12/10/2023    4:50 PM  Readmission Risk Prevention Plan  Transportation Screening Complete Complete Complete  PCP or Specialist Appt within 5-7 Days   Complete  PCP or Specialist Appt within 3-5 Days Complete Complete   Home Care Screening   Complete  Medication Review  (RN CM)   Complete  HRI or Home Care Consult Complete Complete   Social Work Consult for Recovery Care Planning/Counseling Complete Complete   Palliative Care Screening Not Applicable Not Applicable   Medication Review Oceanographer) Complete Referral to Pharmacy

## 2024-06-10 ENCOUNTER — Inpatient Hospital Stay: Admitting: Internal Medicine

## 2024-06-10 DIAGNOSIS — M549 Dorsalgia, unspecified: Secondary | ICD-10-CM | POA: Diagnosis not present

## 2024-06-10 LAB — GLUCOSE, CAPILLARY
Glucose-Capillary: 182 mg/dL — ABNORMAL HIGH (ref 70–99)
Glucose-Capillary: 218 mg/dL — ABNORMAL HIGH (ref 70–99)
Glucose-Capillary: 249 mg/dL — ABNORMAL HIGH (ref 70–99)
Glucose-Capillary: 120 mg/dL — ABNORMAL HIGH (ref 70–99)

## 2024-06-10 NOTE — Progress Notes (Signed)
 OT Cancellation Note  Patient Details Name: Robert [name] MRN: [id number] DOB: 05-24-1951   Cancelled Treatment:    Reason Eval/Treat Not Completed: Pain limiting ability to participate. Attempted 2x to see pt. Pt reporting pain uncontrolled despite recent IV and oral pain meds. 9/10 pain, notified LPN. OT will check back as able.   Leydy Worthey L. Claretta Kendra, OTR/L  06/10/24, 11:42 AM

## 2024-06-10 NOTE — TOC Progression Note (Signed)
 Transition of Care [facility]) - Progression Note    Patient Details  Name: Robert [name] MRN: [id number] Date of Birth: [birth year]  Transition of Care [facility]) CM/SW Contact  Heather DELENA Saltness, LCSW Phone Number: 06/10/2024, 2:26 PM  Clinical Narrative:    CSW met with pt at bedside to discuss SNF bed availability and obtain pt's SNF bed choice. CSW provided pt with list of facilities including name of facility, location, and Medicare Star-Ratings. Pt reports needing time to review before making final decision. CSW will follow up with pt tomorrow.    Medicare Star-Ratings  [facility] [address] [place], [place] [address] [phone] Overall rating ?? Much below average  [facility] [address] [place], [place] [address] [phone] Overall rating ? Much below average  [facility] and [facility] [address] [place], [place] [address] [phone] Overall rating ?? Much below average  [facility] [address] [place], [place] [address] [phone] Overall rating ?? Much below average  [facility] [address] [place], [place] [address] [phone] Overall rating ??? Much below average  [facility] [address] [place], [place] [address] [phone] Overall rating ? Much below average   Expected Discharge Plan: Home w Home Health Services Barriers to Discharge: Continued Medical Work up   Expected Discharge Plan and Services In-house Referral: NA Discharge Planning Services: CM Consult Post Acute Care Choice: Home Health Living arrangements for the past 2 months: Single Family Home                 DME Arranged: N/A DME Agency: NA       HH Arranged: RN, IV Antibiotics HH Agency: [facility] Date HH Agency Contacted: 05/31/24 Time HH Agency Contacted:  [phone] Representative spoke with at [facility] Agency: Amy   Social Drivers of Health (SDOH) Interventions SDOH Screenings   Food Insecurity: No Food Insecurity (05/30/2024)  Housing: High Risk (05/30/2024)  Transportation Needs: No Transportation Needs (05/30/2024)  Utilities: Not At Risk (05/30/2024)  Alcohol  Screen: Low Risk  (04/08/2024)  Depression (PHQ2-9): Low Risk  (05/09/2024)  Financial Resource Strain: Low Risk  (04/08/2024)  Physical Activity: Inactive (04/08/2024)  Social Connections: Moderately Integrated (05/31/2024)  Recent Concern: Social Connections - Moderately Isolated (03/28/2024)  Stress: Stress Concern Present (04/08/2024)  Tobacco Use: High Risk (05/31/2024)  Health Literacy: Adequate Health Literacy (04/08/2024)    Readmission Risk Interventions    04/01/2024    2:58 PM 02/07/2024    2:07 PM 12/10/2023    4:50 PM  Readmission Risk Prevention Plan  Transportation Screening Complete Complete Complete  PCP or Specialist Appt within 5-7 Days   Complete  PCP or Specialist Appt within 3-5 Days Complete Complete   Home Care Screening   Complete  Medication Review (RN CM)   Complete  HRI or Home Care Consult Complete Complete   Social Work Consult for Recovery Care Planning/Counseling Complete Complete   Palliative Care Screening Not Applicable Not Applicable   Medication Review Oceanographer) Complete Referral to Pharmacy     Signed: Heather Saltness, MSW, LCSW Clinical Social Worker Inpatient Care Management 06/10/2024 4:23 PM

## 2024-06-10 NOTE — Plan of Care (Signed)
  Problem: Pain Management: Goal: General experience of comfort will improve Outcome: Progressing   Problem: Urinary Elimination: Goal: Ability to avoid or minimize complications of infection will improve Outcome: Progressing   Problem: Nutrition: Goal: Adequate nutrition will be maintained Outcome: Progressing

## 2024-06-10 NOTE — Progress Notes (Signed)
 Physical Therapy Treatment Patient Details Name: Robert [name] MRN: [id number] DOB: [birth year] Today's Date: 06/10/2024   History of Present Illness 73 y.o. 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 Lumbar spine 8/7 revealed progressive collapse of L5 vertebral body with diffuse edema. Neurosurgery recommended non-surgical mgmt with TLSO, pain control and IV antibiotics. Pt with complex PMH and multiple recent hospitalizations including but not limited to: AFIB, ladder cancer s/p chemotherapy and cystoprostatectomy, right nephroureterectomy and total urethrectomy, AFIB, Type II DM, HLD, HTN, COVID-19, basal cell carcinoma, TEE, PICC placement.    PT Comments  Pt was AxO x 3 pleasant and willing with [facility] encouragement and Pre medicated for pain.  Once he was up he stated, that does feel better to move.  Easily distracted by pain and fearful of causing more pain.  Assisted OOB to amb required increased effort and time.  Slow moving.  General Gait Details: limited distance due to increased pain shooting down right buttock and leg with heavy support on walker.  Increased instability with turns and extra caution needed for any back stepping.  Lpt has rec Pt will need ST Rehab at SNF to address mobility and functional decline prior to safely returning home.  Pt was Indep and caring for his Spouse who suffers from Dementia.      If plan is discharge home, recommend the following: A lot of help with walking and/or transfers;A lot of help with bathing/dressing/bathroom;Assistance with cooking/housework;Assist for transportation;Help with stairs or ramp for entrance   Can travel by private vehicle     No  Equipment Recommendations       Recommendations for Other Services       Precautions / Restrictions Precautions Precautions: Fall;Back Recall of Precautions/Restrictions: Intact Precaution/Restrictions Comments: custom TLSO in  room allows clearance for his Ostomy.  Pt recalls 3/3back precautions but requires VC's during functional taks.  Oh yeah, that's right, stated Pt. Required Braces or Orthoses: Spinal Brace Spinal Brace: Thoracolumbosacral orthotic Restrictions Weight Bearing Restrictions Per Provider Order: No     Mobility  Bed Mobility Overal bed mobility: Needs Assistance Bed Mobility: Supine to Sit     Supine to sit: Used rails, HOB elevated, Min assist     General bed mobility comments: with increased time and effort and heavy use on rail.  Pt able to self apply custom TLSO but with reports of 5/10 pain as he was reaching around to reach brace straps.    Transfers Overall transfer level: Needs assistance Equipment used: Rolling walker (2 wheels) Transfers: Sit to/from Stand Sit to Stand: Contact guard assist, From elevated surface           General transfer comment: cues, pull to stand no overt LOB with static standing noted today    Ambulation/Gait Ambulation/Gait assistance: Contact guard assist, Supervision Gait Distance (Feet): 54 Feet Assistive device: Rolling walker (2 wheels) Gait Pattern/deviations: Step-to pattern, Decreased step length - left Gait velocity: decreased     General Gait Details: limited distance due to increased pain shooting down right buttock and leg with heavy support on walker.  Increased instability with turns and extra caution needed for any back stepping.   Stairs             Wheelchair Mobility     Tilt Bed    Modified Rankin (Stroke Patients Only)       Balance  Communication Communication Communication: No apparent difficulties  Cognition Arousal: Alert Behavior During Therapy: WFL for tasks assessed/performed, Anxious, Restless   PT - Cognitive impairments: Memory, Sequencing, Problem solving (impaired by pain mostly)                       PT -  Cognition Comments: Pt was AxO x 3 pleasant and willing with [facility] encouragement and Pre medicated for pain.  Once he was up he stated, that does feel better to move.  Easily distracted by pain and fearful of causing more pain. Following commands: Intact      Cueing Cueing Techniques: Verbal cues  Exercises      General Comments        Pertinent Vitals/Pain Pain Assessment Pain Assessment: 0-10 Pain Score: 8  Pain Location: back plus travels down backside of R hip Pain Descriptors / Indicators: Discomfort, Guarding, Constant, Grimacing Pain Intervention(s): Monitored during session, Premedicated before session    Home Living                          Prior Function            PT Goals (current goals can now be found in the care plan section)      Frequency    Min 3X/week      PT Plan      Co-evaluation              AM-PAC PT 6 Clicks Mobility   Outcome Measure  Help needed turning from your back to your side while in a flat bed without using bedrails?: A Lot Help needed moving from lying on your back to sitting on the side of a flat bed without using bedrails?: A Lot Help needed moving to and from a bed to a chair (including a wheelchair)?: A Lot Help needed standing up from a chair using your arms (e.g., wheelchair or bedside chair)?: A Little Help needed to walk in hospital room?: A Lot Help needed climbing 3-5 steps with a railing? : Total 6 Click Score: 12    End of Session Equipment Utilized During Treatment: Gait belt Activity Tolerance: Patient limited by pain Patient left: Other (comment) (in bathroom) Nurse Communication: Mobility status PT Visit Diagnosis: Other abnormalities of gait and mobility (R26.89);Pain Pain - Right/Left: Right     Time: 1213-1226 PT Time Calculation (min) (ACUTE ONLY): 13 min  Charges:    $Gait Training: 8-22 mins PT General Charges $$ ACUTE PT VISIT: 1 Visit                    Katheryn Leap   PTA Acute  Rehabilitation Services Office M-F          [phone]

## 2024-06-10 NOTE — Progress Notes (Signed)
 Robert [name]   DOB:1951/08/27   FM#:[id number]   RDW#:[id number]  Medical oncology follow-up note  Subjective: Patient is well-known to me, under my care for this prostate cancer treatment.  He is on adjuvant immunotherapy nivolumab .  He was admitted to hospital due to worsening low back pain.  He has lumbar spine vertebral osteomyelitis, on IV antibiotics.  He still has significant pain when he changes his position or sit, which radiates down to his thigh.  He has some trouble to walk.  He tells me that he is going to rehab.   Objective:  Vitals:   06/10/24 1459 06/10/24 2028  BP: 123/70 121/70  Pulse: 71 66  Resp: 18 16  Temp: 97.8 F (36.6 C) 98.1 F (36.7 C)  SpO2: 97% 98%    Body mass index is 23.62 kg/m.  Intake/Output Summary (Last 24 hours) at 06/10/2024 2334 Last data filed at 06/10/2024 1603 Gross per 24 hour  Intake --  Output 5801 ml  Net -5801 ml     Sclerae unicteric  No leg edema     CBG (last 3)  Recent Labs    06/10/24 1118 06/10/24 1629 06/10/24 2029  GLUCAP 218* 249* 120*     Labs:  Lab Results  Component Value Date   WBC 5.8 06/04/2024   HGB 9.0 (L) 06/04/2024   HCT 28.4 (L) 06/04/2024   MCV 83.5 06/04/2024   PLT 150 06/04/2024   NEUTROABS 3.6 06/04/2024    Urine Studies No results for input(s): UHGB, CRYS in the last 72 hours.  Invalid input(s): UACOL, UAPR, USPG, UPH, UTP, UGL, UKET, UBIL, UNIT, UROB, ULEU, UEPI, UWBC, URBC, UBAC, CAST, UCOM, BILUA  Basic Metabolic Panel: Recent Labs  Lab 06/04/24 0437  NA 135  K 3.9  CL 104  CO2 25  GLUCOSE 176*  BUN 19  CREATININE 1.11  CALCIUM  8.6*   GFR Estimated Creatinine Clearance: 70.8 mL/min (by C-G formula based on SCr of 1.11 mg/dL). Liver Function Tests: No results for input(s): AST, ALT, ALKPHOS, BILITOT, PROT, ALBUMIN in the last 168 hours. No results for input(s): LIPASE, AMYLASE in the last 168 hours. No  results for input(s): AMMONIA in the last 168 hours. Coagulation profile No results for input(s): INR, PROTIME in the last 168 hours.  CBC: Recent Labs  Lab 06/04/24 0437  WBC 5.8  NEUTROABS 3.6  HGB 9.0*  HCT 28.4*  MCV 83.5  PLT 150   Cardiac Enzymes: No results for input(s): CKTOTAL, CKMB, CKMBINDEX, TROPONINI in the last 168 hours. BNP: Invalid input(s): POCBNP CBG: Recent Labs  Lab 06/09/24 2130 06/10/24 0729 06/10/24 1118 06/10/24 1629 06/10/24 2029  GLUCAP 180* 182* 218* 249* 120*   D-Dimer No results for input(s): DDIMER in the last 72 hours. Hgb A1c No results for input(s): HGBA1C in the last 72 hours. Lipid Profile No results for input(s): CHOL, HDL, LDLCALC, TRIG, CHOLHDL, LDLDIRECT in the last 72 hours. Thyroid  function studies No results for input(s): TSH, T4TOTAL, T3FREE, THYROIDAB in the last 72 hours.  Invalid input(s): FREET3 Anemia work up No results for input(s): VITAMINB12, FOLATE, FERRITIN, TIBC, IRON, RETICCTPCT in the last 72 hours. Microbiology No results found for this or any previous visit (from the past 240 hours).    Studies:  No results found.  Assessment: 73 y.o. male  Lumbar vertebral body osteomyelitis Severe low back pain, secondary to pneumonia Type 2 diabetes Hypertension Paroxysmal A-fib, on apixaban  History of invasive bladder cancer, status post surgery, chemo and  currently on adjuvant immunotherapy  Plan:  - Patient missed his last dose nivolumab  due to hospital admission.  I reassured him that it is okay to hold it while he is in hospital. - Antibiotics per primary team - He is going to rehab - I told patient to call me when he is ready to be discharged to home, then I will schedule his follow-up and next treatment.   Onita Mattock, MD 06/10/2024

## 2024-06-10 NOTE — Progress Notes (Signed)
 PROGRESS NOTE    Robert [name]  FMW:[id number] DOB: 1950/12/29 DOA: 05/29/2024 PCP: Garald Karlynn GAILS, MD   Brief Narrative:   73 y.o. male with PMH significant for DM2, HTN, HLD, paroxysmal A-fib on Eliquis , CKD 3, h/o bladder cancer s/p chemotherapy and cystoprostatectomy, right nephroureterectomy and total urethrectomy.   In early July, patient had L4-L5 discitis/osteomyelitis with phlegmon due to Enterococcus faecalis and was on treatment with continuous ampicillin  infusion pump. 8/6, patient presented to the ED with worsening back pain.   Lumbar spine MRI showed Progressive collapse of the L5 vertebral body with diffuse edema and enhancement in the L4 and L5 vertebral bodies, and progressive marrow signal change and enhancement within the S1 vertebral body. Findings are consistent with progressive disc osteomyelitis at L4-5 and progressively into the S1 vertebral body, Extensive paraspinous soft tissue enhancement and ventral epidural enhancement at L4 and L5, Stable lumbar disc disease at L1-2 and L2-3, Broad-based disc protrusion with moderate central and right foraminal stenosis at L3-4, similar to the prior study.    Thoracic spine MRI showed No evidence of discitis or other acute pathology in the thoracic spine and Shallow central disc protrusion at T8-9 without significant stenosis.  Pending placement to SNF. Denies IPR by insurance. On IV ampicillin  until 07/08/24.  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 ID and neurosurgery consulted.  Recommended non-surgical management with thoracolumbar brace and IV antibiotics Currently, he is on IV ampicillin  until 9/15 Patient unable to tolerate TLSO Pain regimen --- Scheduled: OxyContin  15 mg twice daily, gabapentin  300 mg 3 times daily, Lidoderm  patch --- PRN: Dilaudid  4 mg every 4 hours, IV Dilaudid  0.5 mg 4 hours Seen by PT OT.     Type 2 diabetes mellitus A1c 7.1 on 05/31/2024 PTA meds-weekly Ozempic  Continue SSI/Accu-Cheks Currently, he is on carb modified diet   Hypertension Blood pressure controlled on metoprolol    Paroxysmal A-fib Currently, rate controlled on Toprol  25 mg daily Continue chronic anticoagulation with apixaban  5 mg p.o. twice daily.   Hyperlipidemia Aortic atherosclerosis Continue atorvastatin  40 mg p.o. daily.   AKI on CKD 2, resolved Encouraged oral hydration.  Chronic normocytic anemia Hemoglobin stable  Bladder carcinoma status post robotic right nephrectomy, ureterectomy, radical cystectomy creation of ileal urostomy after resection of prostate urethra: No acute issues   Left hydronephrosis and ureteral stent in May 2025: urology follow up    Disposition: Pending SNF. Denied for IPR by insurance.    DVT prophylaxis:  apixaban  (ELIQUIS ) tablet 5 mg     Code Status: Full Code Family Communication:  None at the bedside Status is: Inpatient Remains inpatient appropriate because: Pending SNF placement    Subjective:  No acute events overnight. Resting comfortably in the bed.   Examination:  General exam: Appears calm and comfortable  Respiratory system: Clear to auscultation. Respiratory effort normal. Cardiovascular system: S1 & S2 heard, RRR. No JVD, murmurs, rubs, gallops or clicks.  No pedal edema. Gastrointestinal system: Abdomen is nondistended, soft and  nontender. No organomegaly or masses felt. Normal bowel sounds heard. Central nervous system: Alert and oriented. No focal neurological deficits. Extremities: Symmetric 5 x 5 power. Skin: No rashes, lesions or ulcers Psychiatry: Judgement and insight appear normal. Mood & affect appropriate.      Diet Orders (From admission, onward)     Start     Ordered   05/30/24 0914  Diet heart healthy/carb modified Room service appropriate? Yes; Fluid consistency: Thin  Diet effective now       Question Answer Comment  Diet-HS Snack? Nothing   Room service appropriate? Yes   Fluid consistency: Thin      05/30/24 0913            Objective: Vitals:   06/09/24 0600 06/09/24 1222 06/09/24 1957 06/10/24 0454  BP: 128/65 129/66 117/62 125/74  Pulse: 64 67 67 70  Resp: 18 18 18 18   Temp:  97.8 F (36.6 C) 98 F (36.7 C) 98 F (36.7 C)  TempSrc:  Oral Oral   SpO2:  99% 98% 99%  Weight:      Height:        Intake/Output Summary (Last 24 hours) at 06/10/2024 0805 Last data filed at 06/09/2024 2001 Gross per 24 hour  Intake 480 ml  Output 1200 ml  Net -720 ml   Filed Weights   05/31/24 1036  Weight: 85.7 kg    Scheduled Meds:  apixaban   5 mg Oral BID   atorvastatin   40 mg Oral QHS   Chlorhexidine  Gluconate Cloth  6 each Topical Daily   feeding supplement  237 mL Oral BID BM   gabapentin   300 mg Oral TID   insulin  aspart  0-15 Units Subcutaneous TID WC   metoprolol  succinate  25 mg Oral QHS   oxyCODONE   15 mg Oral Q12H   polyethylene glycol  17 g Oral Daily   sodium chloride  flush  10-40 mL Intracatheter Q12H   Continuous Infusions:  ampicillin  (OMNIPEN) IV 2 g (06/10/24 0453)    Nutritional status     Body mass index is 23.62 kg/m.  Data Reviewed:   CBC: Recent Labs  Lab 06/04/24 0437  WBC 5.8  NEUTROABS 3.6  HGB 9.0*  HCT 28.4*  MCV 83.5  PLT 150   Basic Metabolic Panel: Recent Labs  Lab 06/04/24 0437  NA 135  K 3.9  CL 104  CO2 25  GLUCOSE  176*  BUN 19  CREATININE 1.11  CALCIUM  8.6*   GFR: Estimated Creatinine Clearance: 70.8 mL/min (by C-G formula based on SCr of 1.11 mg/dL). Liver Function Tests: No results for input(s): AST, ALT, ALKPHOS, BILITOT, PROT, ALBUMIN in the last 168 hours.  No results for input(s): LIPASE, AMYLASE in the last 168 hours. No results for input(s): AMMONIA in the last 168 hours. Coagulation Profile: No results for input(s): INR, PROTIME in the last 168 hours. Cardiac Enzymes: No results for input(s): CKTOTAL, CKMB, CKMBINDEX, TROPONINI in the last 168 hours. BNP (last 3 results) No results for input(s): PROBNP in the last 8760 hours. HbA1C: No results for input(s): HGBA1C in the last 72 hours. CBG: Recent Labs  Lab 06/09/24 0755 06/09/24 1144 06/09/24 1621 06/09/24 2130 06/10/24 0729  GLUCAP 125* 244* 184* 180* 182*   Lipid Profile: No results for input(s): CHOL, HDL, LDLCALC, TRIG, CHOLHDL, LDLDIRECT in the last 72 hours. Thyroid  Function Tests: No results for input(s): TSH, T4TOTAL, FREET4, T3FREE, THYROIDAB in  the last 72 hours. Anemia Panel: No results for input(s): VITAMINB12, FOLATE, FERRITIN, TIBC, IRON, RETICCTPCT in the last 72 hours. Sepsis Labs: No results for input(s): PROCALCITON, LATICACIDVEN in the last 168 hours.  No results found for this or any previous visit (from the past 240 hours).      Radiology Studies: No results found.      LOS: 11 days   Time spent= 39 mins    Deliliah Room, MD [facility]  If 7PM-7AM, please contact night-coverage  06/10/2024, 8:05 AM

## 2024-06-11 DIAGNOSIS — M549 Dorsalgia, unspecified: Secondary | ICD-10-CM | POA: Diagnosis not present

## 2024-06-11 LAB — GLUCOSE, CAPILLARY
Glucose-Capillary: 147 mg/dL — ABNORMAL HIGH (ref 70–99)
Glucose-Capillary: 198 mg/dL — ABNORMAL HIGH (ref 70–99)
Glucose-Capillary: 162 mg/dL — ABNORMAL HIGH (ref 70–99)

## 2024-06-11 MED ORDER — OXYCODONE HCL ER 15 MG PO T12A: 15.0000 mg | EXTENDED_RELEASE_TABLET | Freq: Two times a day (BID) | ORAL | 0 refills | Status: DC

## 2024-06-11 MED ORDER — POLYETHYLENE GLYCOL 3350 17 G PO PACK: 17.0000 g | PACK | Freq: Every day | ORAL | Status: AC

## 2024-06-11 MED ORDER — OXYCODONE HCL 5 MG PO TABS: 5.0000 mg | ORAL_TABLET | Freq: Four times a day (QID) | ORAL | 0 refills | Status: DC | PRN

## 2024-06-11 NOTE — TOC Progression Note (Signed)
 Transition of Care [facility]) - Progression Note    Patient Details  Name: Robert [name] MRN: [id number] Date of Birth: 1951-07-27  Transition of Care [facility] CM/SW Contact  Heather DELENA Saltness, LCSW Phone Number: 06/11/2024, 4:23 PM  Clinical Narrative:    CSW met with pt at bedside to discuss SNF bed availability and obtain pt's facility choice. CSW advised pt of new acceptance since yesterday, [facility], with 5/5 Medicare Star-Rating and located in [place]. Pt chooses bed at [facility]. [facility] admissions staff made aware. Insurance authorization requested, currently pending. TOC will continue to follow.   Expected Discharge Plan: Home w Home Health Services Barriers to Discharge: Continued Medical Work up     Expected Discharge Plan and Services In-house Referral: NA Discharge Planning Services: CM Consult Post Acute Care Choice: Home Health Living arrangements for the past 2 months: Single Family Home                 DME Arranged: N/A DME Agency: NA       HH Arranged: RN, IV Antibiotics HH Agency: [facility] Date HH Agency Contacted: 05/31/24 Time HH Agency Contacted: [phone] Representative spoke with at [facility] Agency: Amy   Social Drivers of Health (SDOH) Interventions SDOH Screenings   Food Insecurity: No Food Insecurity (05/30/2024)  Housing: High Risk (05/30/2024)  Transportation Needs: No Transportation Needs (05/30/2024)  Utilities: Not At Risk (05/30/2024)  Alcohol  Screen: Low Risk  (04/08/2024)  Depression (PHQ2-9): Low Risk  (05/09/2024)  Financial Resource Strain: Low Risk  (04/08/2024)  Physical Activity: Inactive (04/08/2024)  Social Connections: Moderately Integrated (05/31/2024)  Recent Concern: Social Connections - Moderately Isolated (03/28/2024)  Stress: Stress Concern Present (04/08/2024)  Tobacco Use: High Risk (05/31/2024)  Health Literacy: Adequate Health Literacy (04/08/2024)    Readmission Risk Interventions    04/01/2024     2:58 PM 02/07/2024    2:07 PM 12/10/2023    4:50 PM  Readmission Risk Prevention Plan  Transportation Screening Complete Complete Complete  PCP or Specialist Appt within 5-7 Days   Complete  PCP or Specialist Appt within 3-5 Days Complete Complete   Home Care Screening   Complete  Medication Review (RN CM)   Complete  HRI or Home Care Consult Complete Complete   Social Work Consult for Recovery Care Planning/Counseling Complete Complete   Palliative Care Screening Not Applicable Not Applicable   Medication Review Oceanographer) Complete Referral to Pharmacy     Signed: Heather Saltness, MSW, LCSW Clinical Social Worker Inpatient Care Management 06/11/2024 4:26 PM

## 2024-06-11 NOTE — Progress Notes (Signed)
 Occupational Therapy Treatment Patient Details Name: Robert [name] MRN: [id number] DOB: 04/24/1951 Today's Date: 06/11/2024   History of present illness 73 y.o. 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 Lumbar spine 8/7 revealed progressive collapse of L5 vertebral body with diffuse edema. Neurosurgery recommended non-surgical mgmt with TLSO, pain control and IV antibiotics. Pt with complex PMH and multiple recent hospitalizations including but not limited to: AFIB, ladder cancer s/p chemotherapy and cystoprostatectomy, right nephroureterectomy and total urethrectomy, AFIB, Type II DM, HLD, HTN, COVID-19, basal cell carcinoma, TEE, PICC placement.   OT comments  Pt premedicated prior to session. Pt presents with decreased insights into deficits, impaired balance, strength and is limited by pain (improving overall since initial evaluation). Pt received in recliner, and is agreeable to session focused on functional mobility and continued reinforcement of spinal precautions for ADL and mobility performance. TLSO donned. MAX A to don socks while seated, functional transfers completed using RW and CGA. Improved carryover of technique and hand placement. Mild posterior sway during mobility in hallway 75 ft, increased unsteadiness during turns. Discharge recommendation updated due to denial of AIR by insurance - patient will benefit from continued inpatient follow up therapy, <3 hours/day. OT to follow acutely.       If plan is discharge home, recommend the following:  Assistance with cooking/housework;Direct supervision/assist for medications management;Direct supervision/assist for financial management;Assist for transportation;Help with stairs or ramp for entrance;Supervision due to cognitive status;A lot of help with walking and/or transfers;A lot of help with bathing/dressing/bathroom   Equipment Recommendations  None recommended by OT        Precautions / Restrictions Precautions Precautions: Fall;Back Precaution Booklet Issued: Yes (comment) Recall of Precautions/Restrictions: Intact Precaution/Restrictions Comments: custom TLSO in room allows clearance for his ostomy.  Pt recalls 3/3 spinal precautions, difficulties implementing during functional task performance. Required Braces or Orthoses: Spinal Brace Spinal Brace: Thoracolumbosacral orthotic Restrictions Weight Bearing Restrictions Per Provider Order: No       Mobility Bed Mobility Overal bed mobility: Needs Assistance             General bed mobility comments: NT, pt recieved upright and left in recliner    Transfers Overall transfer level: Needs assistance Equipment used: Rolling walker (2 wheels) Transfers: Sit to/from Stand Sit to Stand: Contact guard assist, From elevated surface           General transfer comment: pt with improved hand placement, tends to lean forward due to pain, reliant on RW during standing     Balance Overall balance assessment: Needs assistance Sitting-balance support: Feet supported Sitting balance-Leahy Scale: Good     Standing balance support: During functional activity, Reliant on assistive device for balance Standing balance-Leahy Scale: Fair                             ADL either performed or assessed with clinical judgement   ADL Overall ADL's : Needs assistance/impaired                     Lower Body Dressing: Maximal assistance;Sitting/lateral leans;Adhering to back precautions Lower Body Dressing Details (indicate cue type and reason): MAX A to don socks (due to elevated pain levels, pt eager to move due to back discomfort so OT provides increased level of assistance)             Functional mobility during ADLs: Contact guard assist;Minimal assistance;Rolling  walker (2 wheels);Cueing for safety;Cueing for sequencing General ADL Comments: Pt recieved upright in recliner,  improved ability to carryover taught education re: hand placement and RW use     Communication Communication Communication: No apparent difficulties   Cognition Arousal: Alert Behavior During Therapy: WFL for tasks assessed/performed, Anxious, Restless Cognition: Cognition impaired     Awareness: Intellectual awareness impaired   Attention impairment (select first level of impairment): Sustained attention Executive functioning impairment (select all impairments): Organization, Initiation, Sequencing, Reasoning, Problem solving OT - Cognition Comments: Pt with low frusteration tolerance; prefers to do things his own way (expect pain levels influence this) but is redirectable                 Following commands: Intact        Cueing   Cueing Techniques: Verbal cues        General Comments TLSO donned upon arrival, pt manages ostomy bag independently    Pertinent Vitals/ Pain       Pain Assessment Pain Assessment: 0-10 Pain Score: 5  Pain Location: LBP, R shin Pain Descriptors / Indicators: Discomfort, Guarding, Constant, Grimacing Pain Intervention(s): Limited activity within patient's tolerance, Premedicated before session, Monitored during session, Repositioned         Frequency  Min 3X/week        Progress Toward Goals  OT Goals(current goals can now be found in the care plan section)  Progress towards OT goals: Progressing toward goals  Acute Rehab OT Goals OT Goal Formulation: With patient Time For Goal Achievement: 06/19/24 Potential to Achieve Goals: Good ADL Goals Pt Will Perform Grooming: standing;with modified independence Pt Will Perform Lower Body Dressing: with adaptive equipment;sit to/from stand;with supervision Pt Will Transfer to Toilet: with contact guard assist;ambulating;bedside commode;grab bars Pt Will Perform Toileting - Clothing Manipulation and hygiene: sitting/lateral leans;sit to/from stand;with adaptive equipment  Plan          AM-PAC OT 6 Clicks Daily Activity     Outcome Measure   Help from another person eating meals?: None Help from another person taking care of personal grooming?: A Little Help from another person toileting, which includes using toliet, bedpan, or urinal?: A Lot Help from another person bathing (including washing, rinsing, drying)?: A Lot Help from another person to put on and taking off regular upper body clothing?: A Little Help from another person to put on and taking off regular lower body clothing?: A Lot 6 Click Score: 16    End of Session Equipment Utilized During Treatment: Rolling walker (2 wheels);Gait belt;Other (comment) (TLSO)  OT Visit Diagnosis: Unsteadiness on feet (R26.81);Other abnormalities of gait and mobility (R26.89);Muscle weakness (generalized) (M62.81);Pain Pain - Right/Left: Right Pain - part of body: Leg (back)   Activity Tolerance Patient tolerated treatment well;No increased pain   Patient Left in chair;with call bell/phone within reach;with chair alarm set   Nurse Communication Mobility status;Precautions        Time: 1056-1106 OT Time Calculation (min): 10 min  Charges: OT General Charges $OT Visit: 1 Visit OT Treatments $Therapeutic Activity: 8-22 mins  Fabian Coca L. Felicha Frayne, OTR/L  06/11/24, 11:17 AM

## 2024-06-11 NOTE — Discharge Summary (Signed)
 Physician Discharge Summary  Robert [name] FMW:[id number] DOB: 03-15-1951 DOA: 05/29/2024  PCP: Garald Karlynn GAILS, MD  Admit date: 05/29/2024 Discharge date: 06/12/2024  Admitted From: Home Disposition:  SNF  Discharge Condition:Stable CODE STATUS:FULL Diet recommendation: Carb Modified   Brief/Interim Summary: 73 y.o. male with PMH significant for DM2, HTN, HLD, paroxysmal A-fib on Eliquis , CKD 3, h/o bladder cancer s/p chemotherapy and cystoprostatectomy, right nephroureterectomy and total urethrectomy.   In early July, patient had L4-L5 discitis/osteomyelitis with phlegmon due to Enterococcus faecalis and was on treatment with continuous ampicillin  infusion pump. 8/6, patient presented to the ED with worsening back pain.  Lumbar spine MRI showed Progressive collapse of the L5 vertebral body with diffuse edema and enhancement in the L4 and L5 vertebral bodies, and progressive marrow signal change and enhancement within the S1 vertebral body. Findings were consistent with progressive disc osteomyelitis at L4-5 and progressively into the S1 vertebral body, Extensive paraspinous soft tissue enhancement and ventral epidural enhancement at L4 and L5, Stable lumbar disc disease at L1-2 and L2-3, Broad-based disc protrusion with moderate central and right foraminal stenosis at L3-4, similar to the prior study. Thoracic spine MRI showed No evidence of discitis or other acute pathology in the thoracic spine and Shallow central disc protrusion at T8-9 without significant stenosis.  Neurosurgery, ID consulted.  Neurosurgery recommended nonsurgical management with thoracolumbar brace and IV antibiotics.  ID recommended IV ampicillin .  PT recommended SNF on discharge.  He is medically stable for discharge to SNF whenever possible.  He will continue IV antibiotics at SNF.  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 ID and neurosurgery consulted.  Recommended non-surgical management with thoracolumbar brace and IV antibiotics Currently, he is on IV ampicillin  until 9/15 Patient unable to tolerate TLSO Pain regimen Scheduled: OxyContin  15 mg twice daily, gabapentin  300 mg 3 times daily, Lidoderm  patch PT recommended SNF on discharge.  Had PICC line placement   Type 2 diabetes mellitus A1c 7.1 on 05/31/2024 PTA meds-weekly Ozempic  Currently, he is on carb modified diet   Hypertension Blood pressure controlled on metoprolol    Paroxysmal A-fib Currently, rate controlled on Toprol  25 mg daily Continue chronic anticoagulation with apixaban  5 mg p.o. twice daily.   Hyperlipidemia Aortic atherosclerosis Continue atorvastatin  40 mg p.o. daily.   AKI on CKD 2, resolved Encouraged oral hydration.   Chronic normocytic anemia Hemoglobin stable   Bladder carcinoma status post robotic right nephrectomy, ureterectomy, radical cystectomy creation of ileal urostomy after resection of prostate urethra: No acute issues   Left hydronephrosis and ureteral stent in May 2025: urology follow up recommended   Discharge Diagnoses:  Principal Problem:   Intractable back pain Active Problems:   Paroxysmal A-fib (HCC)   Hyperlipidemia   Essential hypertension   Type 2 diabetes mellitus without complication, without long-term current use of insulin  (HCC)   CKD (chronic kidney disease), stage III (HCC)   Normocytic anemia   Vertebral osteomyelitis (HCC)   Aortic atherosclerosis (HCC)    Discharge Instructions  Discharge Instructions     Diet - low sodium heart healthy   Complete by: As directed    Discharge instructions   Complete by: As directed    1)Please take your medications as instructed 2)Do a CBC,  BMP tests in  a week 3)Follow up with neurosurgery as an outpatient .  Name and number of the provider has been attached   Increase activity slowly   Complete by: As directed       Allergies as of 06/12/2024       Reactions   Lisinopril  Swelling, Other (See Comments)   Tongue and throat became swollen   Lyrica  [pregabalin ] Swelling, Other (See Comments)   Patient noticed swelling of the ankles and feet when taking this, so he stopped using it.        Medication List     STOP taking these medications    ampicillin  500 MG capsule Commonly known as: PRINCIPEN   furosemide  20 MG tablet Commonly known as: LASIX    oxyCODONE  5 MG immediate release tablet Commonly known as: Oxy IR/ROXICODONE  Replaced by: oxyCODONE  15 mg 12 hr tablet   traMADol  50 MG tablet Commonly known as: ULTRAM        TAKE these medications    ampicillin  IVPB Inject 2 g into the vein every 4 (four) hours for 26 days. Indication:  Discitis First Dose: Yes Last Day of Therapy:  07/08/24 Labs - Once weekly:  CBC/D and BMP, Labs - Once weekly: ESR and CRP Method of administration: PICC line Method of administration may be changed at the discretion of home infusion pharmacist based upon assessment of the patient and/or caregiver's ability to self-administer the medication ordered. What changed:  how much to take when to take this additional instructions   apixaban  5 MG Tabs tablet Commonly known as: Eliquis  Take 1 tablet (5 mg total) by mouth 2 (two) times daily.   atorvastatin  40 MG tablet Commonly known as: LIPITOR Take 1 tablet (40 mg total) by mouth at bedtime.   B-complex with vitamin C tablet Take 1 tablet by mouth 3 (three) times a week.   EPINEPHrine  0.3 mg/0.3 mL Soaj injection Commonly known as: EPI-PEN Inject 0.3 mg into the muscle as needed for anaphylaxis.   gabapentin  300 MG capsule Commonly known as: NEURONTIN  Take 300 mg by mouth 3 (three) times daily.   lidocaine  5  % Commonly known as: LIDODERM  Place 1 patch onto the skin daily. Remove & Discard patch within 12 hours or as directed by MD What changed:  when to take this reasons to take this additional instructions   methocarbamol  500 MG tablet Commonly known as: ROBAXIN  Take 1 tablet (500 mg total) by mouth every 8 (eight) hours as needed for muscle spasms. What changed: when to take this   metoprolol  succinate 25 MG 24 hr tablet Commonly known as: TOPROL -XL Take 1 tablet (25 mg total) by mouth daily. What changed: when to take this   oxyCODONE  15 mg 12 hr tablet Commonly known as: OXYCONTIN  Take 1 tablet (15 mg total) by mouth every 12 (twelve) hours. Replaces: oxyCODONE  5 MG immediate release tablet   oxyCODONE  5 MG immediate release tablet Commonly known as: Oxy IR/ROXICODONE  Take 1 tablet (5 mg total) by mouth every 6 (six) hours as needed for moderate pain (pain score 4-6).   Ozempic  (1 MG/DOSE) 4 MG/3ML Sopn Generic drug: Semaglutide  (1 MG/DOSE) INJECT 1 MG UNDER THE SKIN ONCE A WEEK AS DIRECTED   polyethylene glycol 17 g packet Commonly known as: MIRALAX  / GLYCOLAX  Take 17 g by mouth daily.   propranolol  10 MG tablet Commonly known as: INDERAL  TAKE 1 TABLET BY MOUTH 4 TIMES DAILY AS NEEDED FOR PALPITATIONS, IRREGULAR OR FAST HEART RATE   senna-docusate 8.6-50 MG tablet  Commonly known as: Senokot-S Take 1-2 tablets by mouth 2 (two) times daily between meals as needed for mild constipation or moderate constipation. What changed:  how much to take when to take this   TYLENOL  500 MG tablet Generic drug: acetaminophen  Take 1,000 mg by mouth daily as needed for mild pain (pain score 1-3), headache or moderate pain (pain score 4-6).   VITAMIN D -3 PO Take 1 tablet by mouth 3 (three) times a week.        Contact information for follow-up providers     Darnella Dorn SAUNDERS, MD. Call in 1 month(s).   Specialty: Neurosurgery Why: Please call to schedule an appointment for 1  month from today in the neurosurgery office to discuss spine changes from disc/osteo. Thank you Contact information: [address], [address] [place] [address] [phone]              Contact information for after-discharge care     Destination     [facility] .   Service: Skilled Nursing Contact information: [address] [place]  [address] [phone]                    Allergies  Allergen Reactions   Lisinopril  Swelling and Other (See Comments)    Tongue and throat became swollen   Lyrica  [Pregabalin ] Swelling and Other (See Comments)    Patient noticed swelling of the ankles and feet when taking this, so he stopped using it.    Consultations: Neurosurgery, ID   Procedures/Studies: MR THORACIC SPINE W WO CONTRAST Result Date: 05/30/2024 EXAM: MRI THORACIC SPINE WITH AND WITHOUT INTRAVENOUS CONTRAST 05/30/2024 07:49:32 AM TECHNIQUE: Multiplanar multisequence MRI of the thoracic spine was performed with and without the administration of intravenous contrast. COMPARISON: None available. CLINICAL HISTORY: Bone mass or bone pain, thoracic spine, aggressive features on xray. Acute onset of back pain beginning yesterday morning; difficulty walking due to pain and weakness; recently treated for discitis. FINDINGS: BONES AND ALIGNMENT: Normal alignment. Normal vertebral body heights. Bone marrow signal is unremarkable. No abnormal enhancement. SPINAL CORD: Normal spinal cord volume. Normal spinal cord signal. SOFT TISSUES: Unremarkable. DEGENERATIVE CHANGES: No significant disc herniation. No spinal canal stenosis or neural foraminal narrowing. No pathologic enhancement or abnormal disc or endplate signal is present in the thoracic spine. A shallow central disc protrusion is present at T8-9 without significant stenosis. IMPRESSION: 1. No evidence of discitis or other acute pathology in the thoracic spine. 2. Shallow  central disc protrusion at T8-9 without significant stenosis. Electronically signed by: Lonni Necessary MD 05/30/2024 08:05 AM EDT RP Workstation: [id number]   MR Lumbar Spine W Tommye Contrast Result Date: 05/30/2024 EXAM: MR Lumbar Spine with and without intravenous contrast. 05/30/2024 07:49:32 AM TECHNIQUE: Multiplanar multisequence MRI of the lumbar spine was performed with and without the administration of intravenous contrast. COMPARISON: MRI of lumbar spine without and with contrast 05/10/2024. CLINICAL HISTORY: Low back pain, prior surgery, new symptoms. Gadavist  8mL; Acute onset of back pain beginning yesterday morning; difficulty walking due to pain and weakness; recently treated for discitis. FINDINGS: BONES AND ALIGNMENT: Progressive collapse of the L5 vertebral body is present. Height now measures 17 mm compared with 24 mm previously. Diffuse edema and enhancement is again noted in the L4 and L5 vertebral bodies. Progressive marrow signal change and enhancement is present within the S1 vertebral body. SPINAL CORD: The conus medullaris terminates at L1-2. SOFT TISSUES: Extensive paraspinous soft tissue enhancement is  present. L1-L2: Lumbar disc disease is stable. L2-L3: Lumbar disc disease is stable. L3-L4: A broad-based disc protrusion with moderate central and right foraminal stenosis is similar to the prior study. L4-L5: Epidural enhancement is again noted. L5-S1: Epidural enhancement is again noted. IMPRESSION: 1. Progressive collapse of the L5 vertebral body with diffuse edema and enhancement in the L4 and L5 vertebral bodies, and progressive marrow signal change and enhancement within the S1 vertebral body. Findings are consistent with progressive disc osteomyelitis at L4-5 and progressively into the S1 vertebral body. 2. Extensive paraspinous soft tissue enhancement and ventral epidural enhancement at L4 and L5. 3. Stable lumbar disc disease at L1-2 and L2-3. 4. Broad-based disc protrusion with  moderate central and right foraminal stenosis at L3-4, similar to the prior study. Findings were called to doctor Medford Tegeler at 8 o'clock am. Electronically signed by: Lonni Necessary MD 05/30/2024 08:03 AM EDT RP Workstation: [id number]   US  EKG SITE RITE Result Date: 05/15/2024 If Site Rite image not attached, placement could not be confirmed due to current cardiac rhythm.     Subjective: Patient seen and examined at bedside today.  Back pain well-controlled.  No fever or leukocytosis.  Currently comfortable.  Medically stable for discharge to SNF whenever possible  Discharge Exam: Vitals:   06/12/24 0353 06/12/24 1128  BP: 120/65 112/70  Pulse: (!) 55 61  Resp: 17   Temp: 97.7 F (36.5 C) 98.4 F (36.9 C)  SpO2: 99% 100%   Vitals:   06/11/24 1118 06/11/24 2008 06/12/24 0353 06/12/24 1128  BP: 118/69 114/70 120/65 112/70  Pulse: 73 62 (!) 55 61  Resp: 16 19 17    Temp:  97.9 F (36.6 C) 97.7 F (36.5 C) 98.4 F (36.9 C)  TempSrc:   Oral Oral  SpO2: 100% 99% 99% 100%  Weight:      Height:        General: Pt is alert, awake, not in acute distress Cardiovascular: RRR, S1/S2 +, no rubs, no gallops Respiratory: CTA bilaterally, no wheezing, no rhonchi Abdominal: Soft, NT, ND, bowel sounds +,urostomy Extremities: no edema, no cyanosis    The results of significant diagnostics from this hospitalization (including imaging, microbiology, ancillary and laboratory) are listed below for reference.     Microbiology: No results found for this or any previous visit (from the past 240 hours).   Labs: BNP (last 3 results) No results for input(s): BNP in the last 8760 hours. Basic Metabolic Panel: No results for input(s): NA, K, CL, CO2, GLUCOSE, BUN, CREATININE, CALCIUM , MG, PHOS in the last 168 hours. Liver Function Tests: No results for input(s): AST, ALT, ALKPHOS, BILITOT, PROT, ALBUMIN in the last 168 hours. No results for  input(s): LIPASE, AMYLASE in the last 168 hours. No results for input(s): AMMONIA in the last 168 hours. CBC: No results for input(s): WBC, NEUTROABS, HGB, HCT, MCV, PLT in the last 168 hours. Cardiac Enzymes: No results for input(s): CKTOTAL, CKMB, CKMBINDEX, TROPONINI in the last 168 hours. BNP: Invalid input(s): POCBNP CBG: Recent Labs  Lab 06/11/24 0748 06/11/24 1619 06/11/24 2229 06/12/24 0734 06/12/24 1127  GLUCAP 147* 198* 162* 149* 218*   D-Dimer No results for input(s): DDIMER in the last 72 hours. Hgb A1c No results for input(s): HGBA1C in the last 72 hours. Lipid Profile No results for input(s): CHOL, HDL, LDLCALC, TRIG, CHOLHDL, LDLDIRECT in the last 72 hours. Thyroid  function studies No results for input(s): TSH, T4TOTAL, T3FREE, THYROIDAB in the last 72 hours.  Invalid  input(s): FREET3 Anemia work up No results for input(s): VITAMINB12, FOLATE, FERRITIN, TIBC, IRON, RETICCTPCT in the last 72 hours. Urinalysis    Component Value Date/Time   COLORURINE YELLOW 03/27/2024 1406   APPEARANCEUR HAZY (A) 03/27/2024 1406   LABSPEC 1.017 03/27/2024 1406   PHURINE 5.0 03/27/2024 1406   GLUCOSEU 150 (A) 03/27/2024 1406   GLUCOSEU 100 (A) 02/27/2023 1547   HGBUR SMALL (A) 03/27/2024 1406   HGBUR negative 12/07/2009 0811   BILIRUBINUR NEGATIVE 03/27/2024 1406   BILIRUBINUR neg 04/29/2021 1443   KETONESUR NEGATIVE 03/27/2024 1406   PROTEINUR 100 (A) 03/27/2024 1406   UROBILINOGEN 0.2 02/27/2023 1547   NITRITE POSITIVE (A) 03/27/2024 1406   LEUKOCYTESUR MODERATE (A) 03/27/2024 1406   Sepsis Labs No results for input(s): WBC in the last 168 hours.  Invalid input(s): PROCALCITONIN, LACTICIDVEN Microbiology No results found for this or any previous visit (from the past 240 hours).  Please note: You were cared for by a hospitalist during your hospital stay. Once you are discharged, your primary  care physician will handle any further medical issues. Please note that NO REFILLS for any discharge medications will be authorized once you are discharged, as it is imperative that you return to your primary care physician (or establish a relationship with a primary care physician if you do not have one) for your post hospital discharge needs so that they can reassess your need for medications and monitor your lab values.    Time coordinating discharge: 40 minutes  SIGNED:   Ivonne Mustache, MD  [facility] 06/12/2024, 12:38 PM Pager [phone]  If 7PM-7AM, please contact night-coverage [email or web address] Password [email or web address]

## 2024-06-11 NOTE — Plan of Care (Signed)
  Problem: Pain Management: Goal: General experience of comfort will improve Outcome: Progressing   Problem: Urinary Elimination: Goal: Ability to avoid or minimize complications of infection will improve Outcome: Progressing   Problem: Elimination: Goal: Will not experience complications related to bowel motility Outcome: Progressing

## 2024-06-11 NOTE — Plan of Care (Signed)
  Problem: Education: Goal: Knowledge of the procedure and recovery process will improve Outcome: Progressing   Problem: Bowel/Gastric: Goal: Gastrointestinal status for postoperative course will improve Outcome: Progressing   Problem: Pain Management: Goal: General experience of comfort will improve Outcome: Progressing   Problem: Skin Integrity: Goal: Demonstration of wound healing without infection will improve Outcome: Progressing   Problem: Urinary Elimination: Goal: Ability to avoid or minimize complications of infection will improve Outcome: Progressing Goal: Ability to achieve and maintain urine output will improve Outcome: Progressing Goal: Home care management will improve Outcome: Progressing   Problem: Education: Goal: Knowledge of General Education information will improve Description: Including pain rating scale, medication(s)/side effects and non-pharmacologic comfort measures Outcome: Progressing   Problem: Health Behavior/Discharge Planning: Goal: Ability to manage health-related needs will improve Outcome: Progressing   Problem: Clinical Measurements: Goal: Ability to maintain clinical measurements within normal limits will improve Outcome: Progressing Goal: Will remain free from infection Outcome: Progressing Goal: Diagnostic test results will improve Outcome: Progressing Goal: Respiratory complications will improve Outcome: Progressing Goal: Cardiovascular complication will be avoided Outcome: Progressing   Problem: Activity: Goal: Risk for activity intolerance will decrease Outcome: Progressing   Problem: Nutrition: Goal: Adequate nutrition will be maintained Outcome: Progressing   Problem: Coping: Goal: Level of anxiety will decrease Outcome: Progressing   Problem: Elimination: Goal: Will not experience complications related to bowel motility Outcome: Progressing Goal: Will not experience complications related to urinary  retention Outcome: Progressing   Problem: Pain Managment: Goal: General experience of comfort will improve and/or be controlled Outcome: Progressing   Problem: Safety: Goal: Ability to remain free from injury will improve Outcome: Progressing   Problem: Skin Integrity: Goal: Risk for impaired skin integrity will decrease Outcome: Progressing   Problem: Education: Goal: Ability to describe self-care measures that may prevent or decrease complications (Diabetes Survival Skills Education) will improve Outcome: Progressing Goal: Individualized Educational Video(s) Outcome: Progressing   Problem: Coping: Goal: Ability to adjust to condition or change in health will improve Outcome: Progressing   Problem: Fluid Volume: Goal: Ability to maintain a balanced intake and output will improve Outcome: Progressing   Problem: Health Behavior/Discharge Planning: Goal: Ability to identify and utilize available resources and services will improve Outcome: Progressing Goal: Ability to manage health-related needs will improve Outcome: Progressing   Problem: Metabolic: Goal: Ability to maintain appropriate glucose levels will improve Outcome: Progressing   Problem: Nutritional: Goal: Maintenance of adequate nutrition will improve Outcome: Progressing Goal: Progress toward achieving an optimal weight will improve Outcome: Progressing   Problem: Skin Integrity: Goal: Risk for impaired skin integrity will decrease Outcome: Progressing   Problem: Tissue Perfusion: Goal: Adequacy of tissue perfusion will improve Outcome: Progressing

## 2024-06-12 ENCOUNTER — Other Ambulatory Visit: Payer: Self-pay | Admitting: Hematology

## 2024-06-12 DIAGNOSIS — M4640 Discitis, unspecified, site unspecified: Secondary | ICD-10-CM | POA: Diagnosis not present

## 2024-06-12 DIAGNOSIS — Z7401 Bed confinement status: Secondary | ICD-10-CM | POA: Diagnosis not present

## 2024-06-12 DIAGNOSIS — C679 Malignant neoplasm of bladder, unspecified: Secondary | ICD-10-CM | POA: Diagnosis not present

## 2024-06-12 DIAGNOSIS — M1611 Unilateral primary osteoarthritis, right hip: Secondary | ICD-10-CM | POA: Diagnosis not present

## 2024-06-12 DIAGNOSIS — Z905 Acquired absence of kidney: Secondary | ICD-10-CM | POA: Diagnosis not present

## 2024-06-12 DIAGNOSIS — D649 Anemia, unspecified: Secondary | ICD-10-CM | POA: Diagnosis not present

## 2024-06-12 DIAGNOSIS — N183 Chronic kidney disease, stage 3 unspecified: Secondary | ICD-10-CM | POA: Diagnosis not present

## 2024-06-12 DIAGNOSIS — M869 Osteomyelitis, unspecified: Secondary | ICD-10-CM | POA: Diagnosis not present

## 2024-06-12 DIAGNOSIS — M4620 Osteomyelitis of vertebra, site unspecified: Secondary | ICD-10-CM | POA: Diagnosis not present

## 2024-06-12 DIAGNOSIS — M4626 Osteomyelitis of vertebra, lumbar region: Secondary | ICD-10-CM | POA: Diagnosis not present

## 2024-06-12 DIAGNOSIS — N182 Chronic kidney disease, stage 2 (mild): Secondary | ICD-10-CM | POA: Diagnosis not present

## 2024-06-12 DIAGNOSIS — R2681 Unsteadiness on feet: Secondary | ICD-10-CM | POA: Diagnosis not present

## 2024-06-12 DIAGNOSIS — E1122 Type 2 diabetes mellitus with diabetic chronic kidney disease: Secondary | ICD-10-CM | POA: Diagnosis not present

## 2024-06-12 DIAGNOSIS — E1129 Type 2 diabetes mellitus with other diabetic kidney complication: Secondary | ICD-10-CM | POA: Diagnosis not present

## 2024-06-12 DIAGNOSIS — Z8551 Personal history of malignant neoplasm of bladder: Secondary | ICD-10-CM | POA: Diagnosis not present

## 2024-06-12 DIAGNOSIS — I10 Essential (primary) hypertension: Secondary | ICD-10-CM | POA: Diagnosis not present

## 2024-06-12 DIAGNOSIS — I1310 Hypertensive heart and chronic kidney disease without heart failure, with stage 1 through stage 4 chronic kidney disease, or unspecified chronic kidney disease: Secondary | ICD-10-CM | POA: Diagnosis not present

## 2024-06-12 DIAGNOSIS — M6281 Muscle weakness (generalized): Secondary | ICD-10-CM | POA: Diagnosis not present

## 2024-06-12 DIAGNOSIS — M549 Dorsalgia, unspecified: Secondary | ICD-10-CM | POA: Diagnosis not present

## 2024-06-12 DIAGNOSIS — R7881 Bacteremia: Secondary | ICD-10-CM | POA: Diagnosis not present

## 2024-06-12 DIAGNOSIS — E119 Type 2 diabetes mellitus without complications: Secondary | ICD-10-CM | POA: Diagnosis not present

## 2024-06-12 DIAGNOSIS — I480 Paroxysmal atrial fibrillation: Secondary | ICD-10-CM | POA: Diagnosis not present

## 2024-06-12 DIAGNOSIS — Z936 Other artificial openings of urinary tract status: Secondary | ICD-10-CM | POA: Diagnosis not present

## 2024-06-12 DIAGNOSIS — M4646 Discitis, unspecified, lumbar region: Secondary | ICD-10-CM | POA: Diagnosis not present

## 2024-06-12 LAB — GLUCOSE, CAPILLARY
Glucose-Capillary: 149 mg/dL — ABNORMAL HIGH (ref 70–99)
Glucose-Capillary: 218 mg/dL — ABNORMAL HIGH (ref 70–99)

## 2024-06-12 MED ORDER — AMPICILLIN IV (FOR PTA / DISCHARGE USE ONLY): 2.0000 g | INTRAVENOUS | 0 refills | Status: DC

## 2024-06-12 MED ORDER — AMPICILLIN IV (FOR PTA / DISCHARGE USE ONLY): 2.0000 g | INTRAVENOUS | 0 refills | Status: AC

## 2024-06-12 NOTE — TOC Progression Note (Signed)
 Transition of Care [facility]) - Progression Note    Patient Details  Name: Robert [name] MRN: [id number] Date of Birth: Nov 19, 1950  [facility]) CM/SW Contact  Heather DELENA Saltness, LCSW Phone Number: 06/12/2024, 9:35 AM  Clinical Narrative:    Pt's insurance authorization for SNF rehab at [facility] is still pending. TOC will continue to follow.   Expected Discharge Plan: Skilled Nursing Facility Barriers to Discharge: Insurance Authorization   Expected Discharge Plan and Services In-house Referral: Clinical Social Work Discharge Planning Services: NA Post Acute Care Choice: Skilled Nursing Facility Living arrangements for the past 2 months: Single Family Home                 DME Arranged: N/A DME Agency: NA       HH Arranged: NA HH Agency: NA Date HH Agency Contacted: 05/31/24 Time HH Agency Contacted: [phone] Representative spoke with at [facility] Agency: Amy   Social Drivers of Health (SDOH) Interventions SDOH Screenings   Food Insecurity: No Food Insecurity (05/30/2024)  Housing: High Risk (05/30/2024)  Transportation Needs: No Transportation Needs (05/30/2024)  Utilities: Not At Risk (05/30/2024)  Alcohol  Screen: Low Risk  (04/08/2024)  Depression (PHQ2-9): Low Risk  (05/09/2024)  Financial Resource Strain: Low Risk  (04/08/2024)  Physical Activity: Inactive (04/08/2024)  Social Connections: Moderately Integrated (05/31/2024)  Recent Concern: Social Connections - Moderately Isolated (03/28/2024)  Stress: Stress Concern Present (04/08/2024)  Tobacco Use: High Risk (05/31/2024)  Health Literacy: Adequate Health Literacy (04/08/2024)    Readmission Risk Interventions    04/01/2024    2:58 PM 02/07/2024    2:07 PM 12/10/2023    4:50 PM  Readmission Risk Prevention Plan  Transportation Screening Complete Complete Complete  PCP or Specialist Appt within 5-7 Days   Complete  PCP or Specialist Appt within 3-5 Days Complete Complete   Home Care Screening   Complete   Medication Review (RN CM)   Complete  HRI or Home Care Consult Complete Complete   Social Work Consult for Recovery Care Planning/Counseling Complete Complete   Palliative Care Screening Not Applicable Not Applicable   Medication Review Oceanographer) Complete Referral to Pharmacy     Signed: Heather Saltness, MSW, LCSW Clinical Social Worker Inpatient Care Management 06/12/2024 9:35 AM

## 2024-06-12 NOTE — Progress Notes (Signed)
 Patient seen and examined at bedside today.  Hemodynamically stable.  No new complaints.  Low back pain stable.  Patient is medically stable for discharge to SNF whenever possible.  Discharge summary and orders are already in place.  No change  in the medical management

## 2024-06-12 NOTE — Progress Notes (Signed)
 Report called/ given unit floor nurse at [facility].

## 2024-06-12 NOTE — Progress Notes (Signed)
 PHARMACY CONSULT NOTE FOR:  OUTPATIENT  PARENTERAL ANTIBIOTIC THERAPY (OPAT)  Indication: Discitis   Regimen: Ampicillin  2 gm IV  every 4 hours  End date: 07/08/24  IV antibiotic discharge orders are pended. To discharging provider:  please sign these orders via discharge navigator,  Select New Orders & click on the button choice - Manage This Unsigned Work.    Thank you for allowing pharmacy to be a part of this patient's care.   Damien Quiet, PharmD, BCPS, BCIDP Infectious Diseases Clinical Pharmacist Phone: [phone] 06/12/2024 11:33 AM

## 2024-06-12 NOTE — TOC Transition Note (Signed)
 Transition of Care [facility]) - Discharge Note   Patient Details  Name: Robert [name] MRN: [id number] Date of Birth: 1951/06/30  Transition of Care [facility]) CM/SW Contact:  Heather DELENA Saltness, LCSW Phone Number: 06/12/2024, 10:58 AM   Clinical Narrative:    Pt discharging to [facility] today. Pt accepted to room 104. D/C packet with signed prescriptions placed in pt's chart at RN station. RN to call report to [phone]. PTAR called at 1:05 PM. Pt aware and in agreement with discharge plan.    Final next level of care: Skilled Nursing Facility Barriers to Discharge: Barriers Resolved   Patient Goals and CMS Choice Patient states their goals for this hospitalization and ongoing recovery are:: To go to [facility] CMS Medicare.gov Compare Post Acute Care list provided to:: Patient Choice offered to / list presented to : Patient [place] ownership interest in [facility]provided to:: Patient    Discharge Placement              Patient chooses bed at: [facility] Patient to be transferred to facility by: PTAR Name of family member notified: Patient Patient and family notified of of transfer: 06/12/24  Discharge Plan and Services Additional resources added to the After Visit Summary for   In-house Referral: Clinical Social Work Discharge Planning Services: NA Post Acute Care Choice: Skilled Nursing Facility          DME Arranged: N/A DME Agency: NA       HH Arranged: NA HH Agency: NA Date HH Agency Contacted: 05/31/24 Time HH Agency Contacted: [phone] Representative spoke with at [facility] Agency: Amy  Social Drivers of Health (SDOH) Interventions SDOH Screenings   Food Insecurity: No Food Insecurity (05/30/2024)  Housing: High Risk (05/30/2024)  Transportation Needs: No Transportation Needs (05/30/2024)  Utilities: Not At Risk (05/30/2024)  Alcohol  Screen: Low Risk  (04/08/2024)  Depression (PHQ2-9): Low Risk  (05/09/2024)  Financial Resource  Strain: Low Risk  (04/08/2024)  Physical Activity: Inactive (04/08/2024)  Social Connections: Moderately Integrated (05/31/2024)  Recent Concern: Social Connections - Moderately Isolated (03/28/2024)  Stress: Stress Concern Present (04/08/2024)  Tobacco Use: High Risk (05/31/2024)  Health Literacy: Adequate Health Literacy (04/08/2024)     Readmission Risk Interventions    04/01/2024    2:58 PM 02/07/2024    2:07 PM 12/10/2023    4:50 PM  Readmission Risk Prevention Plan  Transportation Screening Complete Complete Complete  PCP or Specialist Appt within 5-7 Days   Complete  PCP or Specialist Appt within 3-5 Days Complete Complete   Home Care Screening   Complete  Medication Review (RN CM)   Complete  HRI or Home Care Consult Complete Complete   Social Work Consult for Recovery Care Planning/Counseling Complete Complete   Palliative Care Screening Not Applicable Not Applicable   Medication Review Oceanographer) Complete Referral to Pharmacy     Signed: Heather Saltness, MSW, LCSW Clinical Social Worker Inpatient Care Management 06/12/2024 1:08 PM

## 2024-06-13 ENCOUNTER — Ambulatory Visit: Admitting: Internal Medicine

## 2024-06-13 DIAGNOSIS — I10 Essential (primary) hypertension: Secondary | ICD-10-CM | POA: Diagnosis not present

## 2024-06-13 DIAGNOSIS — N182 Chronic kidney disease, stage 2 (mild): Secondary | ICD-10-CM | POA: Diagnosis not present

## 2024-06-13 DIAGNOSIS — Z905 Acquired absence of kidney: Secondary | ICD-10-CM | POA: Diagnosis not present

## 2024-06-13 DIAGNOSIS — E1122 Type 2 diabetes mellitus with diabetic chronic kidney disease: Secondary | ICD-10-CM | POA: Diagnosis not present

## 2024-06-13 DIAGNOSIS — M4640 Discitis, unspecified, site unspecified: Secondary | ICD-10-CM | POA: Diagnosis not present

## 2024-06-13 DIAGNOSIS — D649 Anemia, unspecified: Secondary | ICD-10-CM | POA: Diagnosis not present

## 2024-06-13 DIAGNOSIS — Z8551 Personal history of malignant neoplasm of bladder: Secondary | ICD-10-CM | POA: Diagnosis not present

## 2024-06-13 DIAGNOSIS — Z936 Other artificial openings of urinary tract status: Secondary | ICD-10-CM | POA: Diagnosis not present

## 2024-06-13 NOTE — Progress Notes (Deleted)
 Patient ID: Robert [name], male   DOB: October 30, 1950, 73 y.o.   MRN: [id number]   HPI: Robert [name] is a 73 y.o.-year-old male, returning for follow-up for  DM2, dx in 2000s, insulin -independent since 2018, uncontrolled, with complications (PN, ED).  Last visit 4 months ago.  Interim history: He has a history of bladder cancer and had transurethral resection on 06/18/2021.  The cancer was high-grade, but noninvasive. He was on BCG bladder instillations - off and on Eliquis .  He had a cancer recurrence before our visit in 11/2021. He was on Keytruda .  He had TURP 12/02/2022.  He completed chemotherapy.  He had surgery to remove the bladder, prostate, and part of kidney in 11/2023.  He had problems with UTIs with bacteremia afterwards. 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 No blurry vision, nausea, chest pain.   Reviewed HbA1c levels: Lab Results  Component Value Date   HGBA1C 7.1 (H) 05/31/2024   HGBA1C 7.1 (A) 02/13/2024   HGBA1C 5.9 (A) 10/05/2023   HGBA1C 6.5 (A) 06/12/2023   HGBA1C 6.7 (A) 02/03/2023   HGBA1C 6.2 (H) 11/25/2022   HGBA1C 6.5 (A) 08/01/2022   HGBA1C 6.7 (A) 03/28/2022   HGBA1C 7.6 (A) 12/06/2021   HGBA1C 6.7 (A) 08/03/2021  06/22/2023: HbA1c 6.5% (outside)  Pt is on a regimen of: - Trulicity  1.5 >> 3 mg weekly >> Ozempic  1 mg weekly - Glipizide  5 mg 2x a day >> 2.5 mg mg before b'fast We stopped Novolog  70/30 10 units 2x a day in 11/2017, when starting Trulicity . He was previously on metformin  ER 1000 mg twice a day, currently off  Pt checks his sugars 1-2 times a day: - am: 125-167, 180 >> 120-140, 230s >> 135-154 (snacks at night) - 2h after b'fast:119-147, 157 > 126-155, 182 >> 136-171 >> n/c - before lunch:  135-142 >> 140-173, 239 >> n/c >> 99, 110 >> n/c - 2h after lunch: 160-179, 282 >> 172 >> 135 >> n/c >>  160-198, 210 - before dinner: 128, 132-159, 207 >> 147 >> 107-171 >> 112-128 >> n/c - 2h after dinner: 121-162, 188 >> n/c >> 110-123 >> 138, 146, 202 >> n/c - bedtime: 99-150s >> 103-135, 159 >> 88-123 >> n/a >> 104-112 - nighttime: 129 >> 91 (wine) >> 118 >> n/c Lowest sugar was 28 right after starting insulin  >> ...102; he has hypoglycemia awareness in the 70s. Highest sugar was 254 >> 210  Glucometer: ReliOn  Pt's meals are: - Breakfast: whole wheat toast + butte - Lunch: tuna salad or tuna sandwich ([employer] delivery), soft taco, meat + baked beans or potatoes - Dinner: [employer] delivery - Snacks: 1-0: nuts, bananas, other fruit He decreased his alcohol  intake. He was swimming 5 times a week before the coronavirus pandemic, now exercising at home.  -No CKD; last BUN/creatinine:  Lab Results  Component Value Date   BUN 19 06/04/2024   BUN 20 06/03/2024   CREATININE 1.11 06/04/2024   CREATININE 1.46 (H) 06/03/2024   Lab Results  Component Value Date   MICRALBCREAT 431 (H) 02/13/2024   MICRALBCREAT 172 (H) 10/05/2023   MICRALBCREAT 4.4 12/07/2009   MICRALBCREAT 30.6 (H) 11/26/2007  Off lisinopril  40 >> stopped 2/2 angioedema.  He was in the emergency room with angioedema 11/20 and 21//2024. This was considered 2/2 Lisinopril . He was treated  with injectable steroids.  He now has an EpiPen .  He had leg swelling before stopping lisinopril , but this resolved.  -+ HL; last set of lipids: Lab Results  Component Value Date   CHOL 116 02/27/2023   HDL 38.60 (L) 02/27/2023   LDLCALC 47 02/27/2023   LDLDIRECT 77.0 07/07/2015   TRIG 150.0 (H) 02/27/2023   CHOLHDL 3 02/27/2023  On Lipitor 40 and co-Q10.  - last eye exam was on 05/02/2024:  no DR, + cataract.   - + numbness but no tingling in his feet.  Last foot exam 06/12/2023.    He has a h/o arrhythmia (A fib) - wore an event monitor >> now on beta blocker. Cardiologist: Dr. Alveta. He has hip pain (OA) in the hip - he  prefers to stand, walks with a cane.  He needs a hip replacement.  ROS: + See HPI  I reviewed pt's medications, allergies, PMH, social hx, family hx, and changes were documented in the history of present illness. Otherwise, unchanged from my initial visit note.  Past Medical History:  Diagnosis Date   Anticoagulant long-term use    eliquis --- managed by cardiology   Aortic atherosclerosis (HCC) 05/30/2024   Arthritis    Bladder cancer [facility])    urologist--- dr Robert;  overlapping   Cataract    History of basal cell carcinoma (BCC) excision    History of COVID-19 05/2019   per pt mild symptoms that resolved   HLD (hyperlipidemia)    HTN (hypertension)    Hx of colonic polyps    Insulin  dependent type 2 diabetes mellitus [facility])    endocrinologist--- dr trixie --  (03-11-2022 per pt check blood sugar 1-2 times daily,  fasting sugar-- 118-120s)   Nocturia    Paroxysmal atrial fibrillation (HCC) 03/12/2019   cardiologist--- dr calhoun   Renal disorder    Ureterocele    Use of cane as ambulatory aid    Past Surgical History:  Procedure Laterality Date   BLEPHAROPLASTY Bilateral    per pt approx  2001;   upper eyelid's   COLONOSCOPY     last one approx 2023   CYSTOSCOPY W/ RETROGRADES N/A 12/08/2023   Procedure: CYSTOSCOPY;  Surgeon: Robert [name]., MD;  Location: [facility];  Service: Urology;  Laterality: N/A;  390 MINUTES NEEDED FOR CASE   CYSTOSCOPY WITH URETEROSCOPY AND STENT PLACEMENT Right 12/02/2022   Procedure: CYSTOSCOPY WITH RIGHT URETEROSCOPY, RIGHT DIAGNOSTIC URETEROSCOPY;  Surgeon: Robert Cough, MD;  Location: [facility];  Service: Urology;  Laterality: Right;  90 MINS FOR CASE   CYSTOSCOPY/RETROGRADE/URETEROSCOPY Bilateral 05/05/2023   Procedure: CYSTOSCOPY BILATERAL RETROGRADE PYELOGRAM RIGHT URETEROSCOPY RIGHT STENT PLACEMENT;  Surgeon: Robert Cough, MD;  Location: [facility];  Service: Urology;  Laterality: Bilateral;  90 MINS FOR CASE    IR IMAGING GUIDED PORT INSERTION  05/30/2023   IR LUMBAR DISC ASPIRATION W/IMG GUIDE  05/12/2024   IR REMOVAL TUN ACCESS W/ PORT W/O FL MOD SED  03/29/2024   PATELLAR TENDON REPAIR Left 04/16/2004   @[facility]    ROBOT ASSITED LAPAROSCOPIC NEPHROURETERECTOMY Right 12/08/2023   Procedure: RIGHT ROBOT ASSISTED Nephroureterectomy;  Surgeon: Robert [name]., MD;  Location: [facility];  Service: Urology;  Laterality: Right;   ROBOT LAP RADICAL CYSTOPROSTATECTOMY PELVIC LYMPHADENECTOMY, NEOBLADDER  12/08/2023   Procedure: ROBOT ASSISTED LAPAROSCOPIC RADICAL CYSTOPROSTATECTOMY;  Surgeon: Robert [name]., MD;  Location: [facility];  Service: Urology;;   TOTAL URETHRECTOMY,RADICAL N/A 12/08/2023   Procedure: TOTAL URETHRECTOMY,RADICAL;  Surgeon: Robert [name]., MD;  Location: [facility];  Service: Urology;  Laterality: N/A;   TRANSESOPHAGEAL ECHOCARDIOGRAM (CATH LAB) N/A 04/02/2024   Procedure: TRANSESOPHAGEAL ECHOCARDIOGRAM;  Surgeon: Delford Maude BROCKS, MD;  Location: [facility] INVASIVE CV LAB;  Service: Cardiovascular;  Laterality: N/A;   TRANSURETHRAL RESECTION OF BLADDER TUMOR N/A 06/18/2021   Procedure: TRANSURETHRAL RESECTION OF BLADDER TUMOR (TURBT) WITH RIGHT URETERAL STENT PLACEMENT, RIGHT URETEROSCOPY WITH DISTRACTION OF TUMOR, FULGURATION;  Surgeon: Robert Cough, MD;  Location: [facility];  Service: Urology;  Laterality: N/A;   TRANSURETHRAL RESECTION OF BLADDER TUMOR N/A 03/15/2022   Procedure: TRANSURETHRAL RESECTION OF BLADDER TUMOR (TURBT) WITH BILATERAL URETEROSCOPY/URETHRAL/ DILATION/BILATERAL RETROGRADE PYELOGRAM/BIOPSY AND FULGURATION OF RIGHT URETERAL CANCER/RIGHT STENT PLACEMENT;  Surgeon: Robert Cough, MD;  Location: [facility];  Service: Urology;  Laterality: N/A;   TRANSURETHRAL RESECTION OF BLADDER TUMOR N/A 05/05/2023   Procedure: TRANSURETHRAL RESECTION OF BLADDER TUMOR (TURBT) BLADDER BIOSPY;  Surgeon: Robert Cough, MD;  Location: [facility];  Service: Urology;  Laterality: N/A;   TRANSURETHRAL RESECTION OF BLADDER TUMOR WITH MITOMYCIN -C N/A 12/02/2022   Procedure: TRANSURETHRAL RESECTION OF BLADDER TUMOR;  Surgeon: Robert Cough, MD;  Location[facility];  Service: Urology;  Laterality: N/A;   TRANSURETHRAL RESECTION OF PROSTATE  05/05/2023   Procedure: TRANSURETHRAL RESECTION OF THE PROSTATE (TURP);  Surgeon: Robert Cough, MD;  Location: [facility];  Service: Urology;;   Social History   Socioeconomic History   Marital status: Married    Spouse name: Not on file   Number of children: 2  Social Needs  Occupational History   Occupation: DVD's   Occupation: Occupational hygienist: Self Employed, retired  Tobacco Use   Smoking status: Former Smoker    Types: Cigars    Last attempt to quit: 02/22/2011   Smokeless tobacco: Never Used   Tobacco comment: 2 cigars monthly or less  Substance and Sexual Activity   Alcohol  use: Yes     0.0 oz    Vodka, 2-3 drinks 3x a week   Drugs: No   Current Outpatient Medications on File Prior to Visit  Medication Sig Dispense Refill   ampicillin  IVPB Inject 2 g into the vein every 4 (four) hours for 26 days. Indication:  Discitis First Dose: Yes Last Day of Therapy:  07/08/24 Labs - Once weekly:  CBC/D and BMP, Labs - Once weekly: ESR and CRP Method of administration: PICC line Method of administration may be changed at the discretion of home infusion pharmacist based upon assessment of the patient and/or caregiver's ability to self-administer the medication ordered. 156 Units 0   apixaban  (ELIQUIS ) 5 MG TABS tablet Take 1 tablet (5 mg total) by mouth 2 (two) times daily. 180 tablet 1   atorvastatin  (LIPITOR) 40 MG tablet Take 1 tablet (40 mg total) by mouth at bedtime.     B Complex-C (B-COMPLEX WITH VITAMIN C) tablet Take 1 tablet by mouth 3 (three) times a week.     Cholecalciferol  (VITAMIN D -3 PO) Take 1 tablet by mouth 3 (three) times a week.      EPINEPHrine  0.3 mg/0.3 mL IJ SOAJ injection Inject 0.3 mg into the muscle as needed for anaphylaxis. 1 each 0   gabapentin  (NEURONTIN ) 300 MG capsule Take 300 mg by mouth 3 (three) times daily.     lidocaine  (LIDODERM ) 5 % Place 1 patch onto the skin daily. Remove & Discard patch within 12 hours or as  directed by MD (Patient taking differently: Place 1 patch onto the skin daily as needed (pain).) 30 patch 0   methocarbamol  (ROBAXIN ) 500 MG tablet Take 1 tablet (500 mg total) by mouth every 8 (eight) hours as needed for muscle spasms. (Patient taking differently: Take 500 mg by mouth daily as needed for muscle spasms.) 60 tablet 0   metoprolol  succinate (TOPROL -XL) 25 MG 24 hr tablet Take 1 tablet (25 mg total) by mouth daily. (Patient taking differently: Take 25 mg by mouth at bedtime.) 90 tablet 2   oxyCODONE  (OXY IR/ROXICODONE ) 5 MG immediate release tablet Take 1 tablet (5 mg total) by mouth every 6 (six) hours as needed for moderate pain (pain score 4-6). 15 tablet 0   oxyCODONE  (OXYCONTIN ) 15 mg 12 hr tablet Take 1 tablet (15 mg total) by mouth every 12 (twelve) hours. 10 tablet 0   polyethylene glycol (MIRALAX  / GLYCOLAX ) 17 g packet Take 17 g by mouth daily.     propranolol  (INDERAL ) 10 MG tablet TAKE 1 TABLET BY MOUTH 4 TIMES DAILY AS NEEDED FOR PALPITATIONS, IRREGULAR OR FAST HEART RATE 270 tablet 1   Semaglutide , 1 MG/DOSE, (OZEMPIC , 1 MG/DOSE,) 4 MG/3ML SOPN INJECT 1 MG UNDER THE SKIN ONCE A WEEK AS DIRECTED 9 mL 3   senna-docusate (SENOKOT-S) 8.6-50 MG tablet Take 1-2 tablets by mouth 2 (two) times daily between meals as needed for mild constipation or moderate constipation. (Patient taking differently: Take 1 tablet by mouth 2 (two) times daily.)     TYLENOL  500 MG tablet Take 1,000 mg by mouth daily as needed for mild pain (pain score 1-3), headache or moderate pain (pain score 4-6).     Current Facility-Administered Medications on File Prior to Visit  Medication Dose Route Frequency  Provider Last Rate Last Admin   sodium chloride  flush (NS) 0.9 % injection 10 mL  10 mL Intracatheter PRN Lanny Callander, MD   10 mL at 02/15/24 1353   Allergies  Allergen Reactions   Lisinopril  Swelling and Other (See Comments)    Tongue and throat became swollen   Lyrica  [Pregabalin ] Swelling and Other (See Comments)    Patient noticed swelling of the ankles and feet when taking this, so he stopped using it.   Family History  Problem Relation Age of Onset   Pancreatic cancer Mother 25   Cancer Paternal Uncle        unknown type cancer   Aortic aneurysm Other        family history   Cancer Other        Breast (1st degree relative)   Colon cancer Neg Hx    Rectal cancer Neg Hx    Stomach cancer Neg Hx    Esophageal cancer Neg Hx    PE: There were no vitals taken for this visit. Wt Readings from Last 12 Encounters:  05/31/24 189 lb (85.7 kg)  05/23/24 189 lb (85.7 kg)  05/10/24 190 lb (86.2 kg)  05/09/24 179 lb 4.8 oz (81.3 kg)  04/23/24 188 lb 9.6 oz (85.5 kg)  04/17/24 186 lb (84.4 kg)  04/12/24 196 lb (88.9 kg)  04/11/24 193 lb 4.8 oz (87.7 kg)  04/08/24 200 lb (90.7 kg)  03/27/24 200 lb (90.7 kg)  03/19/24 200 lb 3.2 oz (90.8 kg)  03/14/24 196 lb 11.2 oz (89.2 kg)   Constitutional: overweight, in NAD Eyes:  EOMI, no exophthalmos ENT: no neck masses, no cervical lymphadenopathy Cardiovascular: RRR, No MRG Respiratory: CTA B Musculoskeletal: no deformities  Skin:no rashes Neurological: no tremor with outstretched hands  ASSESSMENT: 1. DM2, insulin -independent, uncontrolled, with complications - PN - ED  2. HL  3.  Overweight  PLAN:  1. Patient with longstanding, reasonably controlled diabetes, on oral antidiabetic regimen with sulfonylurea and also weekly GLP-1 receptor agonist, but off metformin  due to decreased kidney function.  At last visit sugars were higher in the morning and they were at goal around dinnertime.  He was not checking sugars consistently in  the rest of the day.  He was having snacks at night, which I advised him to stop.  HbA1c was higher, at 7.1% and he had another HbA1c which was stable, at 7.1% earlier this mo. - At last visit, we were discussing about starting an SGLT2 inhibitor, especially as he was off ACE inhibitor due to previous angioedema with lisinopril  but we held off due to UTI with hematuria and sepsis.  - I suggested to:  Patient Instructions  Please continue: - Glipizide  2.5 mg before breakfast  - Ozempic  1 mg weekly  Stop the snacks at night!  Please stop at the lab.  Please return in 4 months with your sugar log.  - advised to check sugars at different times of the day - 1x a day, rotating check times - advised for yearly eye exams >> he is UTD - return to clinic in 4 months  2.  HL - Latest lipid panel was reviewed from 02/2023: Fractions at goal with the exception of a slightly low HDL: Lab Results  Component Value Date   CHOL 116 02/27/2023   HDL 38.60 (L) 02/27/2023   LDLCALC 47 02/27/2023   LDLDIRECT 77.0 07/07/2015   TRIG 150.0 (H) 02/27/2023   CHOLHDL 3 02/27/2023  -He continues on Lipitor 40 mg daily and co-Q10 without side effects  3.  Overweight -He lost approximately 26 pounds immediately after starting GLP-1 receptor agonist and after switching to a more plant-based diet - Weight was approximately stable at the last 2 visits.  Lela Fendt, MD PhD [facility] Endocrinology

## 2024-06-14 DIAGNOSIS — M4640 Discitis, unspecified, site unspecified: Secondary | ICD-10-CM | POA: Diagnosis not present

## 2024-06-14 DIAGNOSIS — R2681 Unsteadiness on feet: Secondary | ICD-10-CM | POA: Diagnosis not present

## 2024-06-14 DIAGNOSIS — I480 Paroxysmal atrial fibrillation: Secondary | ICD-10-CM | POA: Diagnosis not present

## 2024-06-14 DIAGNOSIS — M4620 Osteomyelitis of vertebra, site unspecified: Secondary | ICD-10-CM | POA: Diagnosis not present

## 2024-06-14 DIAGNOSIS — M6281 Muscle weakness (generalized): Secondary | ICD-10-CM | POA: Diagnosis not present

## 2024-06-17 DIAGNOSIS — M4646 Discitis, unspecified, lumbar region: Secondary | ICD-10-CM | POA: Diagnosis not present

## 2024-06-17 DIAGNOSIS — Z936 Other artificial openings of urinary tract status: Secondary | ICD-10-CM | POA: Diagnosis not present

## 2024-06-17 DIAGNOSIS — D649 Anemia, unspecified: Secondary | ICD-10-CM | POA: Diagnosis not present

## 2024-06-17 DIAGNOSIS — I10 Essential (primary) hypertension: Secondary | ICD-10-CM | POA: Diagnosis not present

## 2024-06-17 DIAGNOSIS — M4620 Osteomyelitis of vertebra, site unspecified: Secondary | ICD-10-CM | POA: Diagnosis not present

## 2024-06-17 DIAGNOSIS — E1122 Type 2 diabetes mellitus with diabetic chronic kidney disease: Secondary | ICD-10-CM | POA: Diagnosis not present

## 2024-06-18 DIAGNOSIS — I1310 Hypertensive heart and chronic kidney disease without heart failure, with stage 1 through stage 4 chronic kidney disease, or unspecified chronic kidney disease: Secondary | ICD-10-CM | POA: Diagnosis not present

## 2024-06-18 DIAGNOSIS — E1122 Type 2 diabetes mellitus with diabetic chronic kidney disease: Secondary | ICD-10-CM | POA: Diagnosis not present

## 2024-06-18 DIAGNOSIS — C679 Malignant neoplasm of bladder, unspecified: Secondary | ICD-10-CM | POA: Diagnosis not present

## 2024-06-18 DIAGNOSIS — M4640 Discitis, unspecified, site unspecified: Secondary | ICD-10-CM | POA: Diagnosis not present

## 2024-06-18 DIAGNOSIS — I480 Paroxysmal atrial fibrillation: Secondary | ICD-10-CM | POA: Diagnosis not present

## 2024-06-18 DIAGNOSIS — E785 Hyperlipidemia, unspecified: Secondary | ICD-10-CM | POA: Diagnosis not present

## 2024-06-18 DIAGNOSIS — R2681 Unsteadiness on feet: Secondary | ICD-10-CM | POA: Diagnosis not present

## 2024-06-18 DIAGNOSIS — N182 Chronic kidney disease, stage 2 (mild): Secondary | ICD-10-CM | POA: Diagnosis not present

## 2024-06-18 DIAGNOSIS — R7881 Bacteremia: Secondary | ICD-10-CM | POA: Diagnosis not present

## 2024-06-18 DIAGNOSIS — M4646 Discitis, unspecified, lumbar region: Secondary | ICD-10-CM | POA: Diagnosis not present

## 2024-06-18 DIAGNOSIS — M6281 Muscle weakness (generalized): Secondary | ICD-10-CM | POA: Diagnosis not present

## 2024-06-18 DIAGNOSIS — E1129 Type 2 diabetes mellitus with other diabetic kidney complication: Secondary | ICD-10-CM | POA: Diagnosis not present

## 2024-06-18 DIAGNOSIS — M4620 Osteomyelitis of vertebra, site unspecified: Secondary | ICD-10-CM | POA: Diagnosis not present

## 2024-06-19 ENCOUNTER — Ambulatory Visit: Admitting: Internal Medicine

## 2024-06-19 DIAGNOSIS — I1310 Hypertensive heart and chronic kidney disease without heart failure, with stage 1 through stage 4 chronic kidney disease, or unspecified chronic kidney disease: Secondary | ICD-10-CM | POA: Diagnosis not present

## 2024-06-19 DIAGNOSIS — M4620 Osteomyelitis of vertebra, site unspecified: Secondary | ICD-10-CM | POA: Diagnosis not present

## 2024-06-19 DIAGNOSIS — E119 Type 2 diabetes mellitus without complications: Secondary | ICD-10-CM | POA: Diagnosis not present

## 2024-06-19 DIAGNOSIS — Z8551 Personal history of malignant neoplasm of bladder: Secondary | ICD-10-CM | POA: Diagnosis not present

## 2024-06-19 DIAGNOSIS — M4640 Discitis, unspecified, site unspecified: Secondary | ICD-10-CM | POA: Diagnosis not present

## 2024-06-21 ENCOUNTER — Telehealth: Payer: Self-pay

## 2024-06-21 DIAGNOSIS — E1129 Type 2 diabetes mellitus with other diabetic kidney complication: Secondary | ICD-10-CM | POA: Diagnosis not present

## 2024-06-21 DIAGNOSIS — M4620 Osteomyelitis of vertebra, site unspecified: Secondary | ICD-10-CM | POA: Diagnosis not present

## 2024-06-21 DIAGNOSIS — M4646 Discitis, unspecified, lumbar region: Secondary | ICD-10-CM | POA: Diagnosis not present

## 2024-06-21 DIAGNOSIS — C679 Malignant neoplasm of bladder, unspecified: Secondary | ICD-10-CM | POA: Diagnosis not present

## 2024-06-21 DIAGNOSIS — I1310 Hypertensive heart and chronic kidney disease without heart failure, with stage 1 through stage 4 chronic kidney disease, or unspecified chronic kidney disease: Secondary | ICD-10-CM | POA: Diagnosis not present

## 2024-06-21 DIAGNOSIS — N182 Chronic kidney disease, stage 2 (mild): Secondary | ICD-10-CM | POA: Diagnosis not present

## 2024-06-21 DIAGNOSIS — R7881 Bacteremia: Secondary | ICD-10-CM | POA: Diagnosis not present

## 2024-06-21 DIAGNOSIS — I480 Paroxysmal atrial fibrillation: Secondary | ICD-10-CM | POA: Diagnosis not present

## 2024-06-21 NOTE — Telephone Encounter (Signed)
 Copied from CRM [id number]. Topic: Clinical - Home Health Verbal Orders >> Jun 21, 2024 12:31 PM Rea BROCKS wrote: Caller/Agency: Anna[facility] Callback Number: [phone] Service Requested: Speech Therapy and can add Nursing later if need be.  Frequency:  Any new concerns about the patient? Therisa states if she can get the okay today, patient can get started with a speech therapist this afternoon.

## 2024-06-21 NOTE — Telephone Encounter (Signed)
 Received the following message from Holley Herring, RN with [employer]:  OPAT pt transition home from SNF Received: Nilsa Herring, Sharlet GORMAN Ligas, Diminique T, CMA; Criston Chancellor D, RN; Celestia Lela HERO, CMA; Chandra Lorenda HERO, RMA Hello all. Mr. Field Staniszewski will DC home from [facility] tomorrow. Pt remains on Ampicillin  12 gm continuous infusion.

## 2024-06-24 NOTE — Telephone Encounter (Signed)
 Okay. Thank you.

## 2024-06-25 ENCOUNTER — Other Ambulatory Visit: Payer: Self-pay | Admitting: Nurse Practitioner

## 2024-06-25 ENCOUNTER — Encounter: Payer: Self-pay | Admitting: Hematology

## 2024-06-25 DIAGNOSIS — E1122 Type 2 diabetes mellitus with diabetic chronic kidney disease: Secondary | ICD-10-CM | POA: Diagnosis not present

## 2024-06-25 DIAGNOSIS — M4620 Osteomyelitis of vertebra, site unspecified: Secondary | ICD-10-CM | POA: Diagnosis not present

## 2024-06-25 DIAGNOSIS — M4626 Osteomyelitis of vertebra, lumbar region: Secondary | ICD-10-CM | POA: Diagnosis not present

## 2024-06-25 DIAGNOSIS — Z792 Long term (current) use of antibiotics: Secondary | ICD-10-CM | POA: Diagnosis not present

## 2024-06-25 DIAGNOSIS — E785 Hyperlipidemia, unspecified: Secondary | ICD-10-CM | POA: Diagnosis not present

## 2024-06-25 DIAGNOSIS — I129 Hypertensive chronic kidney disease with stage 1 through stage 4 chronic kidney disease, or unspecified chronic kidney disease: Secondary | ICD-10-CM | POA: Diagnosis not present

## 2024-06-25 DIAGNOSIS — N183 Chronic kidney disease, stage 3 unspecified: Secondary | ICD-10-CM | POA: Diagnosis not present

## 2024-06-25 DIAGNOSIS — E1169 Type 2 diabetes mellitus with other specified complication: Secondary | ICD-10-CM | POA: Diagnosis not present

## 2024-06-25 DIAGNOSIS — Z452 Encounter for adjustment and management of vascular access device: Secondary | ICD-10-CM | POA: Diagnosis not present

## 2024-06-25 DIAGNOSIS — I4811 Longstanding persistent atrial fibrillation: Secondary | ICD-10-CM | POA: Diagnosis not present

## 2024-06-26 DIAGNOSIS — E1169 Type 2 diabetes mellitus with other specified complication: Secondary | ICD-10-CM | POA: Diagnosis not present

## 2024-06-26 DIAGNOSIS — Z452 Encounter for adjustment and management of vascular access device: Secondary | ICD-10-CM | POA: Diagnosis not present

## 2024-06-26 DIAGNOSIS — I129 Hypertensive chronic kidney disease with stage 1 through stage 4 chronic kidney disease, or unspecified chronic kidney disease: Secondary | ICD-10-CM | POA: Diagnosis not present

## 2024-06-26 DIAGNOSIS — N183 Chronic kidney disease, stage 3 unspecified: Secondary | ICD-10-CM | POA: Diagnosis not present

## 2024-06-26 DIAGNOSIS — M4626 Osteomyelitis of vertebra, lumbar region: Secondary | ICD-10-CM | POA: Diagnosis not present

## 2024-06-26 DIAGNOSIS — E1122 Type 2 diabetes mellitus with diabetic chronic kidney disease: Secondary | ICD-10-CM | POA: Diagnosis not present

## 2024-06-26 DIAGNOSIS — E785 Hyperlipidemia, unspecified: Secondary | ICD-10-CM | POA: Diagnosis not present

## 2024-06-26 DIAGNOSIS — Z792 Long term (current) use of antibiotics: Secondary | ICD-10-CM | POA: Diagnosis not present

## 2024-06-26 DIAGNOSIS — I4811 Longstanding persistent atrial fibrillation: Secondary | ICD-10-CM | POA: Diagnosis not present

## 2024-06-27 ENCOUNTER — Telehealth: Payer: Self-pay

## 2024-06-27 ENCOUNTER — Ambulatory Visit: Admitting: Internal Medicine

## 2024-06-27 DIAGNOSIS — E785 Hyperlipidemia, unspecified: Secondary | ICD-10-CM | POA: Diagnosis not present

## 2024-06-27 DIAGNOSIS — Z792 Long term (current) use of antibiotics: Secondary | ICD-10-CM | POA: Diagnosis not present

## 2024-06-27 DIAGNOSIS — Z452 Encounter for adjustment and management of vascular access device: Secondary | ICD-10-CM | POA: Diagnosis not present

## 2024-06-27 DIAGNOSIS — M4626 Osteomyelitis of vertebra, lumbar region: Secondary | ICD-10-CM | POA: Diagnosis not present

## 2024-06-27 DIAGNOSIS — E1122 Type 2 diabetes mellitus with diabetic chronic kidney disease: Secondary | ICD-10-CM | POA: Diagnosis not present

## 2024-06-27 DIAGNOSIS — I4811 Longstanding persistent atrial fibrillation: Secondary | ICD-10-CM | POA: Diagnosis not present

## 2024-06-27 DIAGNOSIS — E1169 Type 2 diabetes mellitus with other specified complication: Secondary | ICD-10-CM | POA: Diagnosis not present

## 2024-06-27 DIAGNOSIS — I129 Hypertensive chronic kidney disease with stage 1 through stage 4 chronic kidney disease, or unspecified chronic kidney disease: Secondary | ICD-10-CM | POA: Diagnosis not present

## 2024-06-27 DIAGNOSIS — N183 Chronic kidney disease, stage 3 unspecified: Secondary | ICD-10-CM | POA: Diagnosis not present

## 2024-06-27 NOTE — Telephone Encounter (Signed)
 Copied from CRM #[id number]. Topic: Clinical - Home Health Verbal Orders >> Jun 27, 2024  9:53 AM Chasity T wrote: Caller/Agency: Charrise physical therapist from [facility] Callback Number: [phone] Service Requested: Physical Therapy Frequency: 1 week 1 and 2 week 2 Any new concerns about the patient? No

## 2024-06-27 NOTE — Telephone Encounter (Signed)
 Copied from CRM [id number]. Topic: Clinical - Medical Advice >> Jun 26, 2024  4:47 PM Winona SAUNDERS wrote: PT Sharice from [facility] calling with concerns about the pt. Pt fasting sugar reading was 053 and they would like to recommend a Podiatrist as the pt has some Calluses on his feet.

## 2024-06-27 NOTE — Telephone Encounter (Signed)
 Received voicemail from Reserve,  with [facility] requesting PT orders.   Called her back and notified her that our office does not manage PT orders and recommended she reach out to patient's PCP.  Sherice: [phone]  Duwaine Lowe, BSN, RN

## 2024-06-28 ENCOUNTER — Telehealth: Payer: Self-pay

## 2024-06-28 NOTE — Telephone Encounter (Signed)
 Reported as E2C2 error, was given phone number [phone]. Called number, and it does not appear to be a secure voicemail to leave voicemails. Unsure if that is Sharice's phone number. Attempted to reach patient as well, will try again later

## 2024-06-28 NOTE — Telephone Encounter (Unsigned)
 Copied from CRM [id number]. Topic: Clinical - Home Health Verbal Orders >> Jun 27, 2024  4:48 PM Jasmin G wrote: Caller/Agency: Mr. Soyla with [facility] Callback Number: [phone] Service Requested: Occupational Therapy Frequency: 2 times a week for 2 weeks Any new concerns about the patient? No

## 2024-06-28 NOTE — Telephone Encounter (Signed)
 Okay. Thank you.

## 2024-06-28 NOTE — Patient Instructions (Signed)
 Robert [name] - I am sorry I was unable to reach you today for our scheduled appointment. I work with Plotnikov, Karlynn GAILS, MD and am calling to support your healthcare needs. Please contact me at [phone] at your earliest convenience. I look forward to speaking with you soon.   Thank you,   Heddy Shutter, RN, MSN, BSN, CCM [place]  [facility], Population Health Case Manager Phone: [phone]

## 2024-06-28 NOTE — Telephone Encounter (Signed)
 Spoke with Charrise, verbals provided. Spoke with Will, verbals provided

## 2024-06-28 NOTE — Telephone Encounter (Signed)
 Spoke with [facility], verbals provided

## 2024-06-29 DIAGNOSIS — M4620 Osteomyelitis of vertebra, site unspecified: Secondary | ICD-10-CM | POA: Diagnosis not present

## 2024-06-30 ENCOUNTER — Other Ambulatory Visit: Payer: Self-pay | Admitting: Hematology

## 2024-06-30 DIAGNOSIS — C679 Malignant neoplasm of bladder, unspecified: Secondary | ICD-10-CM

## 2024-07-01 DIAGNOSIS — E1122 Type 2 diabetes mellitus with diabetic chronic kidney disease: Secondary | ICD-10-CM | POA: Diagnosis not present

## 2024-07-01 DIAGNOSIS — N183 Chronic kidney disease, stage 3 unspecified: Secondary | ICD-10-CM | POA: Diagnosis not present

## 2024-07-01 DIAGNOSIS — R7881 Bacteremia: Secondary | ICD-10-CM | POA: Diagnosis not present

## 2024-07-01 DIAGNOSIS — N1330 Unspecified hydronephrosis: Secondary | ICD-10-CM | POA: Diagnosis not present

## 2024-07-01 DIAGNOSIS — E1169 Type 2 diabetes mellitus with other specified complication: Secondary | ICD-10-CM | POA: Diagnosis not present

## 2024-07-01 DIAGNOSIS — I4811 Longstanding persistent atrial fibrillation: Secondary | ICD-10-CM | POA: Diagnosis not present

## 2024-07-01 DIAGNOSIS — B952 Enterococcus as the cause of diseases classified elsewhere: Secondary | ICD-10-CM | POA: Diagnosis not present

## 2024-07-01 DIAGNOSIS — I129 Hypertensive chronic kidney disease with stage 1 through stage 4 chronic kidney disease, or unspecified chronic kidney disease: Secondary | ICD-10-CM | POA: Diagnosis not present

## 2024-07-01 DIAGNOSIS — Z792 Long term (current) use of antibiotics: Secondary | ICD-10-CM | POA: Diagnosis not present

## 2024-07-01 DIAGNOSIS — E785 Hyperlipidemia, unspecified: Secondary | ICD-10-CM | POA: Diagnosis not present

## 2024-07-01 DIAGNOSIS — M4620 Osteomyelitis of vertebra, site unspecified: Secondary | ICD-10-CM | POA: Diagnosis not present

## 2024-07-01 DIAGNOSIS — M4626 Osteomyelitis of vertebra, lumbar region: Secondary | ICD-10-CM | POA: Diagnosis not present

## 2024-07-01 DIAGNOSIS — M4647 Discitis, unspecified, lumbosacral region: Secondary | ICD-10-CM | POA: Diagnosis not present

## 2024-07-01 DIAGNOSIS — Z452 Encounter for adjustment and management of vascular access device: Secondary | ICD-10-CM | POA: Diagnosis not present

## 2024-07-02 DIAGNOSIS — M4620 Osteomyelitis of vertebra, site unspecified: Secondary | ICD-10-CM | POA: Diagnosis not present

## 2024-07-03 ENCOUNTER — Inpatient Hospital Stay

## 2024-07-03 ENCOUNTER — Inpatient Hospital Stay: Attending: Hematology | Admitting: Hematology

## 2024-07-03 ENCOUNTER — Other Ambulatory Visit

## 2024-07-03 ENCOUNTER — Inpatient Hospital Stay: Attending: Hematology

## 2024-07-03 VITALS — BP 134/64 | HR 89 | Temp 98.1°F | Resp 17 | Ht 75.0 in | Wt 183.4 lb

## 2024-07-03 DIAGNOSIS — Z79899 Other long term (current) drug therapy: Secondary | ICD-10-CM | POA: Insufficient documentation

## 2024-07-03 DIAGNOSIS — D649 Anemia, unspecified: Secondary | ICD-10-CM | POA: Diagnosis not present

## 2024-07-03 DIAGNOSIS — R7881 Bacteremia: Secondary | ICD-10-CM | POA: Diagnosis not present

## 2024-07-03 DIAGNOSIS — N390 Urinary tract infection, site not specified: Secondary | ICD-10-CM | POA: Insufficient documentation

## 2024-07-03 DIAGNOSIS — Z5112 Encounter for antineoplastic immunotherapy: Secondary | ICD-10-CM | POA: Insufficient documentation

## 2024-07-03 DIAGNOSIS — C675 Malignant neoplasm of bladder neck: Secondary | ICD-10-CM

## 2024-07-03 DIAGNOSIS — C679 Malignant neoplasm of bladder, unspecified: Secondary | ICD-10-CM

## 2024-07-03 LAB — CBC WITH DIFFERENTIAL (CANCER CENTER ONLY)
Abs Immature Granulocytes: 0.08 K/uL — ABNORMAL HIGH (ref 0.00–0.07)
Basophils Absolute: 0.1 K/uL (ref 0.0–0.1)
Basophils Relative: 1 %
Eosinophils Absolute: 0.1 K/uL (ref 0.0–0.5)
Eosinophils Relative: 1 %
HCT: 34.6 % — ABNORMAL LOW (ref 39.0–52.0)
Hemoglobin: 11.1 g/dL — ABNORMAL LOW (ref 13.0–17.0)
Immature Granulocytes: 1 %
Lymphocytes Relative: 16 %
Lymphs Abs: 1.5 K/uL (ref 0.7–4.0)
MCH: 26.2 pg (ref 26.0–34.0)
MCHC: 32.1 g/dL (ref 30.0–36.0)
MCV: 81.8 fL (ref 80.0–100.0)
Monocytes Absolute: 0.6 K/uL (ref 0.1–1.0)
Monocytes Relative: 7 %
Neutro Abs: 6.9 K/uL (ref 1.7–7.7)
Neutrophils Relative %: 74 %
Platelet Count: 215 K/uL (ref 150–400)
RBC: 4.23 MIL/uL (ref 4.22–5.81)
RDW: 16.9 % — ABNORMAL HIGH (ref 11.5–15.5)
WBC Count: 9.4 K/uL (ref 4.0–10.5)
nRBC: 0 % (ref 0.0–0.2)

## 2024-07-03 LAB — CMP (CANCER CENTER ONLY)
ALT: 12 U/L (ref 0–44)
AST: 12 U/L — ABNORMAL LOW (ref 15–41)
Albumin: 4.1 g/dL (ref 3.5–5.0)
Alkaline Phosphatase: 90 U/L (ref 38–126)
Anion gap: 7 (ref 5–15)
BUN: 15 mg/dL (ref 8–23)
CO2: 27 mmol/L (ref 22–32)
Calcium: 9.4 mg/dL (ref 8.9–10.3)
Chloride: 104 mmol/L (ref 98–111)
Creatinine: 1.15 mg/dL (ref 0.61–1.24)
GFR, Estimated: >60 mL/min (ref >60)
Glucose, Bld: 183 mg/dL — ABNORMAL HIGH (ref 70–99)
Potassium: 4.2 mmol/L (ref 3.5–5.1)
Sodium: 138 mmol/L (ref 135–145)
Total Bilirubin: 0.3 mg/dL (ref 0.0–1.2)
Total Protein: 7.4 g/dL (ref 6.5–8.1)

## 2024-07-03 LAB — T4, FREE: Free T4: 1.07 ng/dL (ref 0.61–1.12)

## 2024-07-03 LAB — TSH: TSH: 0.791 u[IU]/mL (ref 0.350–4.500)

## 2024-07-03 MED ORDER — NIVOLUMAB-HYALURONIDASE-NVHY 600-10000 MG-UT/5ML ~~LOC~~ SOLN
1200.0000 mg | Freq: Once | SUBCUTANEOUS | Status: AC
  Administered 2024-07-03: 1200 mg via SUBCUTANEOUS
  Filled 2024-07-03: qty 10

## 2024-07-03 NOTE — Assessment & Plan Note (Signed)
 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 -He is clinically doing well, asymptomatic.  Surgical resection was discussed with patient, but he would like to try nonsurgical options. -He started Keytruda  on 01/12/23, stopped in June 2024 due to disease progression. 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 -I recommended neoadjuvant chemotherapy with cisplatin  and gemcitabine  for 4 cycles, followed by total cystectomy and prostatectomy. -He started chemotherapy on June 08, 2023, and completed on 08/17/2023, overall tolerated well  -He underwent right robotic right nephroureterectomy, robotic radical cystectomy with total urethrectomy, bilateral pelvic lymph node dissection, and left cutaneous ureterostomy by Dr. Alvaro on 12/08/2023.. surgical path showed multifocal high-grade urothelial carcinoma, right renal pelvis, ureter, bladder, and prostatic urethra, surgical margines negative, 1 positive node  -He started adjuvant Nivo on 02/15/2024 -CT 03/27/2024 showed no radiographic evidence of malignancy

## 2024-07-03 NOTE — Patient Instructions (Signed)
 [facility] - A DEPT OF [facility]  Discharge Instructions: Thank you for choosing [facility] to provide your oncology and hematology care.   If you have a lab appointment with the Cancer Center, please go directly to the Cancer Center and check in at the registration area.   Wear comfortable clothing and clothing appropriate for easy access to any Portacath or PICC line.   We strive to give you quality time with your provider. You may need to reschedule your appointment if you arrive late (15 or more minutes).  Arriving late affects you and other patients whose appointments are after yours.  Also, if you miss three or more appointments without notifying the office, you may be dismissed from the clinic at the provider's discretion.      For prescription refill requests, have your pharmacy contact our office and allow 72 hours for refills to be completed.    Today you received the following chemotherapy and/or immunotherapy agents: Nivolumab  (opdivo  qvantig)      To help prevent nausea and vomiting after your treatment, we encourage you to take your nausea medication as directed.  BELOW ARE SYMPTOMS THAT SHOULD BE REPORTED IMMEDIATELY: *FEVER GREATER THAN 100.4 F (38 C) OR HIGHER *CHILLS OR SWEATING *NAUSEA AND VOMITING THAT IS NOT CONTROLLED WITH YOUR NAUSEA MEDICATION *UNUSUAL SHORTNESS OF BREATH *UNUSUAL BRUISING OR BLEEDING *URINARY PROBLEMS (pain or burning when urinating, or frequent urination) *BOWEL PROBLEMS (unusual diarrhea, constipation, pain near the anus) TENDERNESS IN MOUTH AND THROAT WITH OR WITHOUT PRESENCE OF ULCERS (sore throat, sores in mouth, or a toothache) UNUSUAL RASH, SWELLING OR PAIN  UNUSUAL VAGINAL DISCHARGE OR ITCHING   Items with * indicate a potential emergency and should be followed up as soon as possible or go to the Emergency Department if any problems should occur.  Please show the CHEMOTHERAPY ALERT CARD  or IMMUNOTHERAPY ALERT CARD at check-in to the Emergency Department and triage nurse.  Should you have questions after your visit or need to cancel or reschedule your appointment, please contact [facility] - A DEPT OF [facility]  Dept: [phone]  and follow the prompts.  Office hours are 8:00 a.m. to 4:30 p.m. Monday - Friday. Please note that voicemails left after 4:00 p.m. may not be returned until the following business day.  We are closed weekends and major holidays. You have access to a nurse at all times for urgent questions. Please call the main number to the clinic Dept: [phone] and follow the prompts.   For any non-urgent questions, you may also contact your provider using MyChart. We now offer e-Visits for anyone 81 and older to request care online for non-urgent symptoms. For details visit [email or web address].   Also download the MyChart app! Go to the app store, search MyChart, open the app, select [place], and log in with your MyChart username and password.

## 2024-07-03 NOTE — Progress Notes (Signed)
 [facility]   [phone] [phone]   Clinic Follow up Note   Patient Care Team: Plotnikov, Karlynn GAILS, MD as PCP - General (Internal Medicine) Nahser, Aleene PARAS, MD (Inactive) as PCP - Cardiology (Cardiology) Liane Sharyne MATSU, [facility] (Inactive) as Pharmacist (Pharmacist) Trixie File, MD as Consulting Physician (Internal Medicine) Nieves Cough, MD as Consulting Physician (Urology) Avram Lupita BRAVO, MD as Consulting Physician (Gastroenterology) Patrcia Sharper, MD as Consulting Physician (Ophthalmology) Lanny Callander, MD as Attending Physician (Hematology and Oncology) Prentiss Heddy HERO, RN as [facility], [place], [place] as [facility] Care Management  Date of Service:  07/03/2024  CHIEF COMPLAINT: f/u of bladder cancer  CURRENT THERAPY:  Adjuvant nivolumab  injection every 4 weeks  Oncology History   Bladder cancer [facility]) Bladder cancer (HCC) -cT2N0M0 stage II diagnosed in 04/2023 -He has had 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 -He is clinically doing well, asymptomatic.  Surgical resection was discussed with patient, but he would like to try nonsurgical options. -He started Keytruda  on 01/12/23, stopped in June 2024 due to disease progression. 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 -I recommended neoadjuvant chemotherapy with cisplatin  and gemcitabine  for 4 cycles, followed by total  cystectomy and prostatectomy. -He started chemotherapy on June 08, 2023, and completed on 08/17/2023, overall tolerated well  -He underwent right robotic right nephroureterectomy, robotic radical cystectomy with total urethrectomy, bilateral pelvic lymph node dissection, and left cutaneous ureterostomy by Dr. Alvaro on 12/08/2023.. surgical path showed multifocal high-grade urothelial carcinoma, right renal pelvis, ureter, bladder, and prostatic urethra, surgical margines negative, 1 positive node  -He started adjuvant Nivo on 02/15/2024 -CT 03/27/2024 showed no radiographic evidence of malignancy   Assessment & Plan Bladder cancer Undergoing adjuvant treatment with nivolumab . Currently on antibiotics for osteomyelitis, which is improving as indicated by the improvement in hemoglobin levels. Nivolumab  treatment is safe to continue while on antibiotics as it does not suppress the immune system. No adverse effects from previous nivolumab  injections. - Proceed with nivolumab  treatment today  faecalis bacteremia secondary to UTI  Currently on ampicillin  via a PICC line. Infection is improving as indicated by improved hemoglobin levels. No adverse effects from antibiotics reported. - Continue current antibiotic regimen - Follow up with infectious disease on the 24th  Anemia Anemia is improving with current hemoglobin levels at 11.1, up from previous levels of 9 to 10. Improvement likely due to resolution of the infection. - Continue monitoring hemoglobin levels  Plan - Lab reviewed, adequate for treatment, will proceed to nivolumab  injection today and continue every 4 weeks - Follow-up in 4 weeks before next treatment   SUMMARY OF ONCOLOGIC HISTORY: Oncology History Overview Note   Cancer Staging  Bladder cancer [facility]) Staging form: Urinary Bladder, AJCC 8th Edition - Clinical stage from 05/14/2021: Stage 0a (cTa, cN0, cM0) - Signed by Lanny Callander, MD on 01/02/2023 Stage prefix: Initial  diagnosis WHO/ISUP grade (low/high): High Grade Histologic grading system: 2 grade system - Pathologic stage from 02/22/2022: Stage I (pT1, pN0,  cM0) - Signed by Lanny Callander, MD on 01/02/2023 WHO/ISUP grade (low/high): High Grade Histologic grading system: 2 grade system     Bladder cancer (HCC)  05/14/2021 Cancer Staging   Staging form: Urinary Bladder, AJCC 8th Edition - Clinical stage from 05/14/2021: Stage 0a (cTa, cN0, cM0) - Signed by Lanny Callander, MD on 01/02/2023 Stage prefix: Initial diagnosis WHO/ISUP grade (low/high): High Grade Histologic grading system: 2 grade system   01/31/2022 Initial Diagnosis   Bladder cancer (HCC)   02/22/2022 Cancer Staging   Staging form: Urinary Bladder, AJCC 8th Edition - Pathologic stage from 02/22/2022: Stage I (pT1, pN0, cM0) - Signed by Lanny Callander, MD on 01/02/2023 WHO/ISUP grade (low/high): High Grade Histologic grading system: 2 grade system   01/12/2023 - 04/06/2023 Chemotherapy   Patient is on Treatment Plan : BLADDER Pembrolizumab  (200) q21d     05/05/2023 Pathology Results     FINAL MICROSCOPIC DIAGNOSIS:   A. BLADDER, RIGHT POSTERIOR, TURBT:  Noninvasive high grade papillary urothelial carcinoma  Muscularis propria (detrusor muscle) is not present   B. BLADDER NECK AND PROSTATIC URETHRA, BIOPSY:  High grade urothelial carcinoma  The carcinoma focally invades muscularis propria (detrusor muscle)     05/05/2023 Cancer Staging   Staging form: Urinary Bladder, AJCC 8th Edition - Clinical stage from 05/05/2023: Stage II (cT2, cN0, cM0) - Signed by Lanny Callander, MD on 06/15/2023 WHO/ISUP grade (low/high): High Grade Histologic grading system: 2 grade system   06/01/2023 Imaging   CT Chest, Abdomen, and Pelvis with contrast  IMPRESSION: 1. Similar irregular asymmetric wall thickening along the posterior urinary bladder with progressive irregular thickening of the right ureter extending to the level of the renal pelvis where there is now a masslike  16 x 12 mm soft tissue focus, suspicious for direct ureteral extension of disease into the renal pelvis. 2. Irregular asymmetric wall thickening along the posterior urinary bladder measuring 14 mm is similar prior. 3. Right double-J ureteral stent is in place with persistent perinephric/periureteric stranding without significant hydronephrosis. 4. No convincing evidence of metastatic disease in the chest, abdomen or pelvis 5. Pancolonic diverticulosis without findings of acute diverticulitis. 6. Aortic Atherosclerosis (ICD10-I70.0) and Emphysema (ICD10-J43.9).   06/09/2023 - 08/17/2023 Chemotherapy   Patient is on Treatment Plan : BLADDER Cisplatin  D1 + Gemcitabine  D1,8 q21d x 4 Cycles     02/15/2024 -  Chemotherapy   Patient is on Treatment Plan : BLADDER Nivolumab  (480) q28d        Discussed the use of AI scribe software for clinical note transcription with the patient, who gave verbal consent to proceed.  History of Present Illness Robert [name] is a 73 year old male with blood cancer who presents for follow-up.  He is currently receiving ampicillin  via a PICC line, with daily infusion bag changes using a pump. He has tolerated the antibiotics well without significant side effects such as diarrhea or fever, although he does not feel completely well.  He experiences significant pain, especially when lying on his left side, which he rates as fifteen out of ten. He finds some relief by adjusting his leg position in his hospital bed at home. He continues physical therapy at home twice a week.  He has a history of anemia and a resolved episode of acute kidney injury. He is concerned about a chronic kidney disease diagnosis mistakenly listed in his records. He also has type two diabetes, irregular heart rate, low back pain, and neuropathy.  He has no new  joint pain, skin rash, or cough. He experiences nasal dryness and congestion, likely due to the fall season. He has gained three  pounds since his last hospital weigh-in.     All other systems were reviewed with the patient and are negative.  MEDICAL HISTORY:  Past Medical History:  Diagnosis Date   Anticoagulant long-term use    eliquis --- managed by cardiology   Aortic atherosclerosis (HCC) 05/30/2024   Arthritis    Bladder cancer [facility])    urologist--- dr nieves;  overlapping   Cataract    History of basal cell carcinoma (BCC) excision    History of COVID-19 05/2019   per pt mild symptoms that resolved   HLD (hyperlipidemia)    HTN (hypertension)    Hx of colonic polyps    Insulin  dependent type 2 diabetes mellitus [facility])    endocrinologist--- dr trixie --  (03-11-2022 per pt check blood sugar 1-2 times daily,  fasting sugar-- 118-120s)   Nocturia    Paroxysmal atrial fibrillation (HCC) 03/12/2019   cardiologist--- dr calhoun   Renal disorder    Ureterocele    Use of cane as ambulatory aid     SURGICAL HISTORY: Past Surgical History:  Procedure Laterality Date   BLEPHAROPLASTY Bilateral    per pt approx  2001;   upper eyelid's   COLONOSCOPY     last one approx 2023   CYSTOSCOPY W/ RETROGRADES N/A 12/08/2023   Procedure: CYSTOSCOPY;  Surgeon: Alvaro Ricardo KATHEE Mickey., MD;  Location: [facility];  Service: Urology;  Laterality: N/A;  390 MINUTES NEEDED FOR CASE   CYSTOSCOPY WITH URETEROSCOPY AND STENT PLACEMENT Right 12/02/2022   Procedure: CYSTOSCOPY WITH RIGHT URETEROSCOPY, RIGHT DIAGNOSTIC URETEROSCOPY;  Surgeon: nieves Cough, MD;  Location: [facility];  Service: Urology;  Laterality: Right;  90 MINS FOR CASE   CYSTOSCOPY/RETROGRADE/URETEROSCOPY Bilateral 05/05/2023   Procedure: CYSTOSCOPY BILATERAL RETROGRADE PYELOGRAM RIGHT URETEROSCOPY RIGHT STENT PLACEMENT;  Surgeon: nieves Cough, MD;  Location[facility];  Service: Urology;  Laterality: Bilateral;  90 MINS FOR CASE   IR IMAGING GUIDED PORT INSERTION  05/30/2023   IR LUMBAR DISC ASPIRATION W/IMG GUIDE  05/12/2024   IR REMOVAL TUN  ACCESS W/ PORT W/O FL MOD SED  03/29/2024   PATELLAR TENDON REPAIR Left 04/16/2004   @[facility]    ROBOT ASSITED LAPAROSCOPIC NEPHROURETERECTOMY Right 12/08/2023   Procedure: RIGHT ROBOT ASSISTED Nephroureterectomy;  Surgeon: Alvaro Ricardo KATHEE Mickey., MD;  Location: [facility];  Service: Urology;  Laterality: Right;   ROBOT LAP RADICAL CYSTOPROSTATECTOMY PELVIC LYMPHADENECTOMY, NEOBLADDER  12/08/2023   Procedure: ROBOT ASSISTED LAPAROSCOPIC RADICAL CYSTOPROSTATECTOMY;  Surgeon: Alvaro Ricardo KATHEE Mickey., MD;  Location: [facility];  Service: Urology;;   TOTAL URETHRECTOMY,RADICAL N/A 12/08/2023   Procedure: TOTAL URETHRECTOMY,RADICAL;  Surgeon: Alvaro Ricardo KATHEE Mickey., MD;  Location: [facility];  Service: Urology;  Laterality: N/A;   TRANSESOPHAGEAL ECHOCARDIOGRAM (CATH LAB) N/A 04/02/2024   Procedure: TRANSESOPHAGEAL ECHOCARDIOGRAM;  Surgeon: Delford Maude BROCKS, MD;  Location: [facility] IN[facility]IVE CV [facility];  Service: Cardiovascular;  Laterality: N/A;   TRANSURETHRAL RESECTION OF BLADDER TUMOR N/A 06/18/2021   Procedure: TRANSURETHRAL RESECTION OF BLADDER TUMOR (TURBT) WITH RIGHT URETERAL STENT PLACEMENT, RIGHT URETEROSCOPY WITH DISTRACTION OF TUMOR, FULGURATION;  Surgeon: nieves Cough, MD;  Location: [facility];  Service: Urology;  Laterality: N/A;   TRANSURETHRAL RESECTION OF BLADDER TUMOR N/A 03/15/2022   Procedure: TRANSURETHRAL RESECTION OF BLADDER TUMOR (TURBT) WITH BILATERAL URETEROSCOPY/URETHRAL/ DILATION/BILATERAL RETROGRADE PYELOGRAM/BIOPSY AND FULGURATION OF RIGHT URETERAL CANCER/RIGHT STENT PLACEMENT;  Surgeon: nieves,  Donnice, MD;  Location: [facility];  Service: Urology;  Laterality: N/A;   TRANSURETHRAL RESECTION OF BLADDER TUMOR N/A 05/05/2023   Procedure: TRANSURETHRAL RESECTION OF BLADDER TUMOR (TURBT) BLADDER BIOSPY;  Surgeon: Nieves Donnice, MD;  Location: [facility];  Service: Urology;  Laterality: N/A;   TRANSURETHRAL RESECTION OF BLADDER TUMOR WITH MITOMYCIN -C N/A  12/02/2022   Procedure: TRANSURETHRAL RESECTION OF BLADDER TUMOR;  Surgeon: Nieves Donnice, MD;  Location: [facility];  Service: Urology;  Laterality: N/A;   TRANSURETHRAL RESECTION OF PROSTATE  05/05/2023   Procedure: TRANSURETHRAL RESECTION OF THE PROSTATE (TURP);  Surgeon: Nieves Donnice, MD;  Location: [facility];  Service: Urology;;    I have reviewed the social history and family history with the patient and they are unchanged from previous note.  ALLERGIES:  is allergic to lisinopril  and lyrica  [pregabalin ].  MEDICATIONS:  Current Outpatient Medications  Medication Sig Dispense Refill   ampicillin  IVPB Inject 2 g into the vein every 4 (four) hours for 26 days. Indication:  Discitis First Dose: Yes Last Day of Therapy:  07/08/24 Labs - Once weekly:  CBC/D and BMP, Labs - Once weekly: ESR and CRP Method of administration: PICC line Method of administration may be changed at the discretion of home infusion pharmacist based upon assessment of the patient and/or caregiver's ability to self-administer the medication ordered. 156 Units 0   apixaban  (ELIQUIS ) 5 MG TABS tablet Take 1 tablet (5 mg total) by mouth 2 (two) times daily. 180 tablet 1   atorvastatin  (LIPITOR) 40 MG tablet Take 1 tablet (40 mg total) by mouth at bedtime.     B Complex-C (B-COMPLEX WITH VITAMIN C) tablet Take 1 tablet by mouth 3 (three) times a week.     Cholecalciferol  (VITAMIN D -3 PO) Take 1 tablet by mouth 3 (three) times a week.     EPINEPHrine  0.3 mg/0.3 mL IJ SOAJ injection Inject 0.3 mg into the muscle as needed for anaphylaxis. 1 each 0   gabapentin  (NEURONTIN ) 300 MG capsule Take 300 mg by mouth 3 (three) times daily.     lidocaine  (LIDODERM ) 5 % Place 1 patch onto the skin daily. Remove & Discard patch within 12 hours or as directed by MD (Patient taking differently: Place 1 patch onto the skin daily as needed (pain).) 30 patch 0   methocarbamol  (ROBAXIN ) 500 MG tablet Take 1 tablet (500 mg  total) by mouth every 8 (eight) hours as needed for muscle spasms. (Patient taking differently: Take 500 mg by mouth daily as needed for muscle spasms.) 60 tablet 0   metoprolol  succinate (TOPROL -XL) 25 MG 24 hr tablet Take 1 tablet (25 mg total) by mouth daily. (Patient taking differently: Take 25 mg by mouth at bedtime.) 90 tablet 2   oxyCODONE  (OXY IR/ROXICODONE ) 5 MG immediate release tablet Take 1 tablet (5 mg total) by mouth every 6 (six) hours as needed for moderate pain (pain score 4-6). 15 tablet 0   oxyCODONE  (OXYCONTIN ) 15 mg 12 hr tablet Take 1 tablet (15 mg total) by mouth every 12 (twelve) hours. 10 tablet 0   polyethylene glycol (MIRALAX  / GLYCOLAX ) 17 g packet Take 17 g by mouth daily.     propranolol  (INDERAL ) 10 MG tablet TAKE 1 TABLET BY MOUTH 4 TIMES DAILY AS NEEDED FOR PALPITATIONS, IRREGULAR OR FAST HEART RATE 270 tablet 1   Semaglutide , 1 MG/DOSE, (OZEMPIC , 1 MG/DOSE,) 4 MG/3ML SOPN INJECT 1 MG UNDER THE SKIN ONCE A WEEK AS DIRECTED 9  mL 3   senna-docusate (SENOKOT-S) 8.6-50 MG tablet Take 1-2 tablets by mouth 2 (two) times daily between meals as needed for mild constipation or moderate constipation. (Patient taking differently: Take 1 tablet by mouth 2 (two) times daily.)     TYLENOL  500 MG tablet Take 1,000 mg by mouth daily as needed for mild pain (pain score 1-3), headache or moderate pain (pain score 4-6).     No current facility-administered medications for this visit.   Facility-Administered Medications Ordered in Other Visits  Medication Dose Route Frequency Provider Last Rate Last Admin   sodium chloride  flush (NS) 0.9 % injection 10 mL  10 mL Intracatheter PRN Lanny Callander, MD   10 mL at 02/15/24 1353    PHYSICAL EXAMINATION: ECOG PERFORMANCE STATUS: 2 - Symptomatic, <50% confined to bed  Vitals:   07/03/24 1425  BP: 134/64  Pulse: 89  Resp: 17  Temp: 98.1 F (36.7 C)  SpO2: 96%   Wt Readings from Last 3 Encounters:  07/03/24 183 lb 6.4 oz (83.2 kg)   05/31/24 189 lb (85.7 kg)  05/23/24 189 lb (85.7 kg)     GENERAL:alert, no distress and comfortable SKIN: skin color, texture, turgor are normal, no rashes or significant lesions EYES: normal, Conjunctiva are pink and non-injected, sclera clear NECK: supple, thyroid  normal size, non-tender, without nodularity LYMPH:  no palpable lymphadenopathy in the cervical, axillary  LUNGS: clear to auscultation and percussion with normal breathing effort HEART: regular rate & rhythm and no murmurs and no lower extremity edema ABDOMEN:abdomen soft, non-tender and normal bowel sounds Musculoskeletal:no cyanosis of digits and no clubbing  NEURO: alert & oriented x 3 with fluent speech, no focal motor/sensory deficits  Physical Exam   LABORATORY DATA:  I have reviewed the data as listed    Latest Ref Rng & Units 07/03/2024    2:00 PM 06/04/2024    4:37 AM 06/03/2024    2:15 AM  CBC  WBC 4.0 - 10.5 K/uL 9.4  5.8  6.3   Hemoglobin 13.0 - 17.0 g/dL 88.8  9.0  9.8   Hematocrit 39.0 - 52.0 % 34.6  28.4  30.4   Platelets 150 - 400 K/uL 215  150  177         Latest Ref Rng & Units 07/03/2024    2:00 PM 06/04/2024    4:37 AM 06/03/2024    2:15 AM  CMP  Glucose 70 - 99 mg/dL 816  823  856   BUN 8 - 23 mg/dL 15  19  20    Creatinine 0.61 - 1.24 mg/dL 8.84  8.88  8.53   Sodium 135 - 145 mmol/L 138  135  136   Potassium 3.5 - 5.1 mmol/L 4.2  3.9  4.2   Chloride 98 - 111 mmol/L 104  104  100   CO2 22 - 32 mmol/L 27  25  27    Calcium  8.9 - 10.3 mg/dL 9.4  8.6  8.9   Total Protein 6.5 - 8.1 g/dL 7.4     Total Bilirubin 0.0 - 1.2 mg/dL 0.3     Alkaline Phos 38 - 126 U/L 90     AST 15 - 41 U/L 12     ALT 0 - 44 U/L 12         RADIOGRAPHIC STUDIES: I have personally reviewed the radiological images as listed and agreed with the findings in the report. No results found.    No orders of the defined types were  placed in this encounter.  All questions were answered. The patient knows to call the  clinic with any problems, questions or concerns. No barriers to learning was detected. The total time spent in the appointment was 30 minutes, including review of chart and various tests results, discussions about plan of care and coordination of care plan     Onita Mattock, MD 07/03/2024

## 2024-07-04 ENCOUNTER — Telehealth: Payer: Self-pay | Admitting: Hematology

## 2024-07-04 DIAGNOSIS — E1122 Type 2 diabetes mellitus with diabetic chronic kidney disease: Secondary | ICD-10-CM | POA: Diagnosis not present

## 2024-07-04 DIAGNOSIS — I129 Hypertensive chronic kidney disease with stage 1 through stage 4 chronic kidney disease, or unspecified chronic kidney disease: Secondary | ICD-10-CM | POA: Diagnosis not present

## 2024-07-04 DIAGNOSIS — I4811 Longstanding persistent atrial fibrillation: Secondary | ICD-10-CM | POA: Diagnosis not present

## 2024-07-04 DIAGNOSIS — Z792 Long term (current) use of antibiotics: Secondary | ICD-10-CM | POA: Diagnosis not present

## 2024-07-04 DIAGNOSIS — M4620 Osteomyelitis of vertebra, site unspecified: Secondary | ICD-10-CM | POA: Diagnosis not present

## 2024-07-04 DIAGNOSIS — M4626 Osteomyelitis of vertebra, lumbar region: Secondary | ICD-10-CM | POA: Diagnosis not present

## 2024-07-04 DIAGNOSIS — Z452 Encounter for adjustment and management of vascular access device: Secondary | ICD-10-CM | POA: Diagnosis not present

## 2024-07-04 DIAGNOSIS — N183 Chronic kidney disease, stage 3 unspecified: Secondary | ICD-10-CM | POA: Diagnosis not present

## 2024-07-04 DIAGNOSIS — E1169 Type 2 diabetes mellitus with other specified complication: Secondary | ICD-10-CM | POA: Diagnosis not present

## 2024-07-04 DIAGNOSIS — E785 Hyperlipidemia, unspecified: Secondary | ICD-10-CM | POA: Diagnosis not present

## 2024-07-04 NOTE — Telephone Encounter (Signed)
Pt is aware of his upcoming appt

## 2024-07-05 DIAGNOSIS — Z936 Other artificial openings of urinary tract status: Secondary | ICD-10-CM | POA: Diagnosis not present

## 2024-07-05 DIAGNOSIS — Z8551 Personal history of malignant neoplasm of bladder: Secondary | ICD-10-CM | POA: Diagnosis not present

## 2024-07-06 ENCOUNTER — Other Ambulatory Visit: Payer: Self-pay | Admitting: Internal Medicine

## 2024-07-06 DIAGNOSIS — M4620 Osteomyelitis of vertebra, site unspecified: Secondary | ICD-10-CM | POA: Diagnosis not present

## 2024-07-08 ENCOUNTER — Telehealth: Payer: Self-pay

## 2024-07-08 ENCOUNTER — Encounter: Payer: Self-pay | Admitting: Internal Medicine

## 2024-07-08 DIAGNOSIS — D631 Anemia in chronic kidney disease: Secondary | ICD-10-CM | POA: Diagnosis not present

## 2024-07-08 DIAGNOSIS — N183 Chronic kidney disease, stage 3 unspecified: Secondary | ICD-10-CM | POA: Diagnosis not present

## 2024-07-08 DIAGNOSIS — Z792 Long term (current) use of antibiotics: Secondary | ICD-10-CM | POA: Diagnosis not present

## 2024-07-08 DIAGNOSIS — E785 Hyperlipidemia, unspecified: Secondary | ICD-10-CM | POA: Diagnosis not present

## 2024-07-08 DIAGNOSIS — E1122 Type 2 diabetes mellitus with diabetic chronic kidney disease: Secondary | ICD-10-CM | POA: Diagnosis not present

## 2024-07-08 DIAGNOSIS — I4811 Longstanding persistent atrial fibrillation: Secondary | ICD-10-CM | POA: Diagnosis not present

## 2024-07-08 DIAGNOSIS — Z452 Encounter for adjustment and management of vascular access device: Secondary | ICD-10-CM | POA: Diagnosis not present

## 2024-07-08 DIAGNOSIS — M4626 Osteomyelitis of vertebra, lumbar region: Secondary | ICD-10-CM | POA: Diagnosis not present

## 2024-07-08 DIAGNOSIS — E1169 Type 2 diabetes mellitus with other specified complication: Secondary | ICD-10-CM | POA: Diagnosis not present

## 2024-07-08 DIAGNOSIS — I129 Hypertensive chronic kidney disease with stage 1 through stage 4 chronic kidney disease, or unspecified chronic kidney disease: Secondary | ICD-10-CM | POA: Diagnosis not present

## 2024-07-08 NOTE — Telephone Encounter (Signed)
 Patient called to ask  next steps regarding antbx and picc line. Completed last dose of IV antbx today. Was told by [facility] they do not have orders to extend treatment or pull picc line. Tully, Rn with [facility] called pt and instructed him how to flush line.  Secure chat forwarded to provider. Tully [phone] Lorenda CHRISTELLA Code, RMA

## 2024-07-09 DIAGNOSIS — M4646 Discitis, unspecified, lumbar region: Secondary | ICD-10-CM | POA: Diagnosis not present

## 2024-07-09 NOTE — Telephone Encounter (Signed)
 Pt scheduled for appt with Melvenia, Np tomorrow at 4 to discuss picc line and next steps.  Lorenda CHRISTELLA Code, RMA

## 2024-07-10 ENCOUNTER — Other Ambulatory Visit: Payer: Self-pay

## 2024-07-10 ENCOUNTER — Telehealth: Payer: Self-pay

## 2024-07-10 ENCOUNTER — Ambulatory Visit: Admitting: Infectious Diseases

## 2024-07-10 ENCOUNTER — Encounter: Payer: Self-pay | Admitting: Infectious Diseases

## 2024-07-10 VITALS — BP 133/72 | HR 87 | Temp 98.1°F | Resp 16 | Wt 186.0 lb

## 2024-07-10 DIAGNOSIS — B952 Enterococcus as the cause of diseases classified elsewhere: Secondary | ICD-10-CM

## 2024-07-10 DIAGNOSIS — M4626 Osteomyelitis of vertebra, lumbar region: Secondary | ICD-10-CM

## 2024-07-10 DIAGNOSIS — Z452 Encounter for adjustment and management of vascular access device: Secondary | ICD-10-CM | POA: Diagnosis not present

## 2024-07-10 DIAGNOSIS — N1330 Unspecified hydronephrosis: Secondary | ICD-10-CM | POA: Diagnosis not present

## 2024-07-10 DIAGNOSIS — M4647 Discitis, unspecified, lumbosacral region: Secondary | ICD-10-CM | POA: Diagnosis not present

## 2024-07-10 DIAGNOSIS — R7881 Bacteremia: Secondary | ICD-10-CM | POA: Diagnosis not present

## 2024-07-10 DIAGNOSIS — M4620 Osteomyelitis of vertebra, site unspecified: Secondary | ICD-10-CM

## 2024-07-10 MED ORDER — AMOXICILLIN 500 MG PO CAPS
1000.0000 mg | ORAL_CAPSULE | Freq: Three times a day (TID) | ORAL | 1 refills | Status: DC
Start: 2024-07-10 — End: 2024-09-02

## 2024-07-10 NOTE — Patient Instructions (Signed)
 Visit Information  Thank you for taking time to visit with me today. Please don't hesitate to contact me if I can be of assistance to you before our next scheduled appointment.  Your next care management appointment is by telephone on 08/08/24 at 10:00 am   Please call the care guide team at [phone] if you need to cancel, schedule, or reschedule an appointment.   Please call the Suicide and Crisis Lifeline: 988 call the [place]  National Suicide Prevention Lifeline: (518) 441-9177 or TTY: 484-633-1905 TTY 5515958802) to talk to a trained counselor if you are experiencing a Mental Health or Behavioral Health Crisis or need someone to talk to.  Heddy Shutter, RN, MSN, BSN, CCM [place]  [facility], Population Health Case Manager Phone: [phone]

## 2024-07-10 NOTE — Telephone Encounter (Signed)
 Verbal orders given to Holliday, RN at [facility] - per Dixon,NP - PICC line can be pulled today 07/10/2024. HH nurse will be out to see patient tomorrow to pull picc line.    Khrystyne Arpin SHAUNNA Letters, CMA

## 2024-07-10 NOTE — Progress Notes (Unsigned)
 Patient: Robert [name]  DOB: November 24, 1950 MRN: [id number] PCP: Garald Karlynn GAILS, MD  Referring Provider: ***  Reason for Visit: ***  Chief Complaint  Patient presents with  . Follow-up    Discitis       Subjective   Subjective:    Discussed the use of AI scribe software for clinical note transcription with the patient, who gave verbal consent to proceed.  History of Present Illness      ROS  Past Medical History:  Diagnosis Date  . Anticoagulant long-term use    eliquis --- managed by cardiology  . Aortic atherosclerosis (HCC) 05/30/2024  . Arthritis   . Bladder cancer [facility])    urologist--- dr nieves;  overlapping  . Cataract   . History of basal cell carcinoma (BCC) excision   . History of COVID-19 05/2019   per pt mild symptoms that resolved  . HLD (hyperlipidemia)   . HTN (hypertension)   . Hx of colonic polyps   . Insulin  dependent type 2 diabetes mellitus [facility])    endocrinologist--- dr trixie --  (03-11-2022 per pt check blood sugar 1-2 times daily,  fasting sugar-- 118-120s)  . Nocturia   . Paroxysmal atrial fibrillation (HCC) 03/12/2019   cardiologist--- dr calhoun  . Renal disorder   . Ureterocele   . Use of cane as ambulatory aid     Outpatient Medications Prior to Visit  Medication Sig Dispense Refill  . apixaban  (ELIQUIS ) 5 MG TABS tablet Take 1 tablet (5 mg total) by mouth 2 (two) times daily. 180 tablet 1  . atorvastatin  (LIPITOR) 40 MG tablet Take 1 tablet (40 mg total) by mouth at bedtime.    . B Complex-C (B-COMPLEX WITH VITAMIN C) tablet Take 1 tablet by mouth 3 (three) times a week.    . Cholecalciferol  (VITAMIN D -3 PO) Take 1 tablet by mouth 3 (three) times a week.    . gabapentin  (NEURONTIN ) 300 MG capsule Take 300 mg by mouth 3 (three) times daily.    . lidocaine  (LIDODERM ) 5 % Place 1 patch onto the skin daily. Remove & Discard patch within 12 hours or as directed by MD 30 patch 0  . methocarbamol  (ROBAXIN ) 500 MG  tablet Take 1 tablet (500 mg total) by mouth every 8 (eight) hours as needed for muscle spasms. 60 tablet 0  . metoprolol  succinate (TOPROL -XL) 25 MG 24 hr tablet Take 1 tablet (25 mg total) by mouth daily. 90 tablet 2  . oxyCODONE  (OXY IR/ROXICODONE ) 5 MG immediate release tablet Take 1 tablet (5 mg total) by mouth every 6 (six) hours as needed for moderate pain (pain score 4-6). 15 tablet 0  . oxyCODONE  (OXYCONTIN ) 15 mg 12 hr tablet Take 1 tablet (15 mg total) by mouth every 12 (twelve) hours. 10 tablet 0  . polyethylene glycol (MIRALAX  / GLYCOLAX ) 17 g packet Take 17 g by mouth daily.    . propranolol  (INDERAL ) 10 MG tablet TAKE 1 TABLET BY MOUTH 4 TIMES DAILY AS NEEDED FOR PALPITATIONS, IRREGULAR OR FAST HEART RATE 270 tablet 1  . Semaglutide , 1 MG/DOSE, (OZEMPIC , 1 MG/DOSE,) 4 MG/3ML SOPN INJECT 1 MG UNDER THE SKIN ONCE A WEEK AS DIRECTED 9 mL 3  . senna-docusate (SENOKOT-S) 8.6-50 MG tablet Take 1-2 tablets by mouth 2 (two) times daily between meals as needed for mild constipation or moderate constipation.    . TYLENOL  500 MG tablet Take 1,000 mg by mouth daily as needed for mild pain (pain score  1-3), headache or moderate pain (pain score 4-6).    . EPINEPHrine  0.3 mg/0.3 mL IJ SOAJ injection Inject 0.3 mg into the muscle as needed for anaphylaxis. (Patient not taking: Reported on 07/10/2024) 1 each 0  . furosemide  (LASIX ) 20 MG tablet TAKE 1 TO 2 TABLETS BY MOUTH DAILY AS NEEDED (Patient not taking: Reported on 07/10/2024) 180 tablet 1   Facility-Administered Medications Prior to Visit  Medication Dose Route Frequency Provider Last Rate Last Admin  . sodium chloride  flush (NS) 0.9 % injection 10 mL  10 mL Intracatheter PRN Lanny Callander, MD   10 mL at 02/15/24 1353     Allergies  Allergen Reactions  . Lisinopril  Swelling and Other (See Comments)    Tongue and throat became swollen  . Lyrica  [Pregabalin ] Swelling and Other (See Comments)    Patient noticed swelling of the ankles and feet when  taking this, so he stopped using it.    Social History   Tobacco Use  . Smoking status: Some Days    Current packs/day: 0.25    Average packs/day: 0.3 packs/day for 13.7 years (3.7 ttl pk-yrs)    Types: Cigars, Cigarettes    Start date: 2012    Last attempt to quit: 02/22/2011  . Smokeless tobacco: Never  . Tobacco comments:    Smoking cessation instruction/counseling given:  counseled patient on the dangers of tobacco use, advised patient to stop smoking, and reviewed strategies to maximize success   Vaping Use  . Vaping status: Never Used  Substance Use Topics  . Alcohol  use: Yes    Alcohol /week: 3.0 standard drinks of alcohol     Types: 3 Shots of liquor per week    Comment: 6-8oz /wk OCC MARTINI 2 TO 3 X WEEK  . Drug use: Never    Family History  Problem Relation Age of Onset  . Pancreatic cancer Mother 22  . Cancer Paternal Uncle        unknown type cancer  . Aortic aneurysm Other        family history  . Cancer Other        Breast (1st degree relative)  . Colon cancer Neg Hx   . Rectal cancer Neg Hx   . Stomach cancer Neg Hx   . Esophageal cancer Neg Hx        Objective   Objective:   Vitals:   07/10/24 1524 07/10/24 1526  BP:  133/72  Pulse:  87  Resp:  16  Temp:  98.1 F (36.7 C)  TempSrc:  Oral  SpO2:  99%  Weight: 186 lb (84.4 kg)    Body mass index is 23.25 kg/m.  Physical Exam      Assessment & Plan:   Problem List Items Addressed This Visit   None   Assessment and Plan Assessment & Plan      No orders of the defined types were placed in this encounter.   Meds ordered this encounter  Medications  . amoxicillin  (AMOXIL ) 500 MG capsule    Sig: Take 2 capsules (1,000 mg total) by mouth 3 (three) times daily.    Dispense:  180 capsule    Refill:  1    Return in about 4 weeks (around 08/07/2024).   Corean Fireman, MSN, NP-C [facility]   [email or web address] Pager: [phone] Office: [phone] [facility] Main Line: [phone] *Secure Chat Communication Welcome

## 2024-07-10 NOTE — Telephone Encounter (Signed)
 Tully with [facility] called to follow up on orders. Informed RN that pt has appt today and final decision would be made at visit.  RN is requesting call before 5. Pt is scheduled for re certification tomorrow. So picc will need to be pulled tomorrow or Friday before services is closed. P: [phone] Lorenda CHRISTELLA Code, RMA

## 2024-07-10 NOTE — Patient Instructions (Addendum)
  Please send me a mychart message with the following:   When your stent is planned to be removed  When you have your back MRI scheduled  When you see Dr. Onetha again   I want to continue the antibiotic until that stent comes out out of concern it could be infected   Would probably do 0600 am, 2 pm then 10 pm for your antibiotic schedule   Please come back to see Dr. Dea or Dr. Dennise in 4 weeks

## 2024-07-10 NOTE — Patient Outreach (Signed)
 Complex Care Management   Visit Note  07/10/2024  Name:  Robert [name] MRN: [id number] DOB: [birth year]  Situation: Referral received for Complex Care Management related to bladder ca(left uretorostomy, discitis/myelomililtis back I obtained verbal consent from Patient.  Visit completed with Patient  on the phone  Background:   Past Medical History:  Diagnosis Date   Anticoagulant long-term use    eliquis --- managed by cardiology   Aortic atherosclerosis (HCC) 05/30/2024   Arthritis    Bladder cancer [facility])    urologist--- dr nieves;  overlapping   Cataract    History of basal cell carcinoma (BCC) excision    History of COVID-19 05/2019   per pt mild symptoms that resolved   HLD (hyperlipidemia)    HTN (hypertension)    Hx of colonic polyps    Insulin  dependent type 2 diabetes mellitus [facility])    endocrinologist--- dr trixie --  (03-11-2022 per pt check blood sugar 1-2 times daily,  fasting sugar-- 118-120s)   Nocturia    Paroxysmal atrial fibrillation (HCC) 03/12/2019   cardiologist--- dr calhoun   Renal disorder    Ureterocele    Use of cane as ambulatory aid     Assessment: Patient Reported Symptoms:  Cognitive Cognitive Status: No symptoms reported      Neurological Neurological Review of Symptoms: No symptoms reported    HEENT HEENT Symptoms Reported: No symptoms reported      Cardiovascular Cardiovascular Symptoms Reported: No symptoms reported    Respiratory Respiratory Symptoms Reported: No symptoms reported    Endocrine Endocrine Symptoms Reported: No symptoms reported List most recent blood sugar readings, include date and time of day: coninues to take semaglutide  as prescribed, A1C 7.1 on 05/31/24    Gastrointestinal Gastrointestinal Symptoms Reported: No symptoms reported      Genitourinary Genitourinary Symptoms Reported: No symptoms reported Other Genitourinary Symptoms: ureterostomy-paient    Integumentary Integumentary Symptoms Reported:  No symptoms reported    Musculoskeletal Musculoskelatal Symptoms Reviewed: Back pain, Difficulty walking Additional Musculoskeletal Details: reports back pain ranged 3-6/10. Currently denies pain at this time. Patient states tylenol  helps pain better than oxycodone . patient reports he has a hospital bed that helps with pain management and positioning.        Psychosocial Psychosocial Symptoms Reported: No symptoms reported          There were no vitals filed for this visit.  Medications Reviewed Today     Reviewed by Gryffin Altice M, RN (Registered Nurse) on 07/10/24 at 1338  Med List Status: <None>   Medication Order Taking? Sig Documenting Provider Last Dose Status Informant  apixaban  (ELIQUIS ) 5 MG TABS tablet [id number] Yes Take 1 tablet (5 mg total) by mouth 2 (two) times daily. Nahser, Aleene PARAS, MD  Active Self, Pharmacy Records  atorvastatin  (LIPITOR) 40 MG tablet [id number] Yes Take 1 tablet (40 mg total) by mouth at bedtime. Cheryle Page, MD  Active Self, Pharmacy Records           Med Note (CRUTHIS, CHLOE C   Thu May 30, 2024 12:27 PM) No fill hx found. Pt is adamant he is taking this.   B Complex-C (B-COMPLEX WITH VITAMIN C) tablet [id number] Yes Take 1 tablet by mouth 3 (three) times a week. [provider]  Active Self, Pharmacy Records           Med Note [facility], DEVERE DELENA Repress Feb 04, 2024  4:51 PM)    Cholecalciferol  (VITAMIN D -3 PO) [id number] Yes  Take 1 tablet by mouth 3 (three) times a week. [provider]  Active Self, Pharmacy Records           Med Note (CRUTHIS, CHLOE C   Thu May 30, 2024 10:05 AM)    EPINEPHrine  0.3 mg/0.3 mL IJ SOAJ injection [id number] Yes Inject 0.3 mg into the muscle as needed for anaphylaxis. Victor Lynwood DASEN, PA-C  Active Self, Pharmacy Records  furosemide  (LASIX ) 20 MG tablet [id number] Yes TAKE 1 TO 2 TABLETS BY MOUTH DAILY AS NEEDED Plotnikov, Aleksei V, MD  Active   gabapentin  (NEURONTIN ) 300 MG capsule  [id number] Yes Take 300 mg by mouth 3 (three) times daily. [provider]  Active Self, Pharmacy Records  lidocaine  (LIDODERM ) 5 % [id number] Yes Place 1 patch onto the skin daily. Remove & Discard patch within 12 hours or as directed by MD Arlon Carliss ORN, DO  Active Self, Pharmacy Records           Med Note (CRUTHIS, CHLOE C   Thu May 30, 2024 10:04 AM)    methocarbamol  (ROBAXIN ) 500 MG tablet [id number] Yes Take 1 tablet (500 mg total) by mouth every 8 (eight) hours as needed for muscle spasms. Arlon Carliss ORN, DO  Active Self, Pharmacy Records  metoprolol  succinate (TOPROL -XL) 25 MG 24 hr tablet [id number] Yes Take 1 tablet (25 mg total) by mouth daily. Nahser, Aleene PARAS, MD  Active Self, Pharmacy Records  oxyCODONE  (OXY IR/ROXICODONE ) 5 MG immediate release tablet [id number] Yes Take 1 tablet (5 mg total) by mouth every 6 (six) hours as needed for moderate pain (pain score 4-6). Jillian Buttery, MD  Active   oxyCODONE  (OXYCONTIN ) 15 mg 12 hr tablet [id number] Yes Take 1 tablet (15 mg total) by mouth every 12 (twelve) hours. Jillian Buttery, MD  Active   polyethylene glycol (MIRALAX  / GLYCOLAX ) 17 g packet [id number] Yes Take 17 g by mouth daily. Jillian Buttery, MD  Active   propranolol  (INDERAL ) 10 MG tablet [id number] Yes TAKE 1 TABLET BY MOUTH 4 TIMES DAILY AS NEEDED FOR PALPITATIONS, IRREGULAR OR FAST HEART RATE Nahser, Aleene PARAS, MD  Active Self, Pharmacy Records           Med Note [facility], NATHANEL SAILOR   Fri May 10, 2024  5:08 PM)    Semaglutide , 1 MG/DOSE, (OZEMPIC , 1 MG/DOSE,) 4 MG/3ML NELMA [id number] Yes INJECT 1 MG UNDER THE SKIN ONCE A WEEK AS DIRECTED Trixie File, MD  Active Self, Pharmacy Records           Med Note [place], NATHANEL SAILOR   Fri Mar 01, 2024  5:59 PM)    senna-docusate (SENOKOT-S) 8.6-50 MG tablet [id number] Yes Take 1-2 tablets by mouth 2 (two) times daily between meals as needed for mild constipation or moderate constipation. Gonfa, Taye T, MD  Active Self, Pharmacy Records   sodium chloride  flush (NS) 0.9 % injection 10 mL [id number]   Lanny Callander, MD  Active   TYLENOL  500 MG tablet [id number] Yes Take 1,000 mg by mouth daily as needed for mild pain (pain score 1-3), headache or moderate pain (pain score 4-6). [provider]  Active Self, Pharmacy Records          Recommendation:   Specialty provider follow-up Infectious disease Continue Current Plan of Care  Follow Up Plan:   Telephone follow up appointment date/time:  08/08/24 at 10:00 am  Heddy Shutter, RN, MSN, BSN, CCM [place]  [facility], Population Health Case Manager Phone: [phone]

## 2024-07-11 DIAGNOSIS — Z792 Long term (current) use of antibiotics: Secondary | ICD-10-CM | POA: Diagnosis not present

## 2024-07-11 DIAGNOSIS — E785 Hyperlipidemia, unspecified: Secondary | ICD-10-CM | POA: Diagnosis not present

## 2024-07-11 DIAGNOSIS — N183 Chronic kidney disease, stage 3 unspecified: Secondary | ICD-10-CM | POA: Diagnosis not present

## 2024-07-11 DIAGNOSIS — E1122 Type 2 diabetes mellitus with diabetic chronic kidney disease: Secondary | ICD-10-CM | POA: Diagnosis not present

## 2024-07-11 DIAGNOSIS — I129 Hypertensive chronic kidney disease with stage 1 through stage 4 chronic kidney disease, or unspecified chronic kidney disease: Secondary | ICD-10-CM | POA: Diagnosis not present

## 2024-07-11 DIAGNOSIS — M4626 Osteomyelitis of vertebra, lumbar region: Secondary | ICD-10-CM | POA: Diagnosis not present

## 2024-07-11 DIAGNOSIS — E1169 Type 2 diabetes mellitus with other specified complication: Secondary | ICD-10-CM | POA: Diagnosis not present

## 2024-07-11 DIAGNOSIS — I4811 Longstanding persistent atrial fibrillation: Secondary | ICD-10-CM | POA: Diagnosis not present

## 2024-07-11 DIAGNOSIS — Z452 Encounter for adjustment and management of vascular access device: Secondary | ICD-10-CM | POA: Diagnosis not present

## 2024-07-12 ENCOUNTER — Telehealth: Payer: Self-pay

## 2024-07-12 DIAGNOSIS — E1169 Type 2 diabetes mellitus with other specified complication: Secondary | ICD-10-CM | POA: Diagnosis not present

## 2024-07-12 DIAGNOSIS — N183 Chronic kidney disease, stage 3 unspecified: Secondary | ICD-10-CM | POA: Diagnosis not present

## 2024-07-12 DIAGNOSIS — E1122 Type 2 diabetes mellitus with diabetic chronic kidney disease: Secondary | ICD-10-CM | POA: Diagnosis not present

## 2024-07-12 DIAGNOSIS — Z792 Long term (current) use of antibiotics: Secondary | ICD-10-CM | POA: Diagnosis not present

## 2024-07-12 DIAGNOSIS — E785 Hyperlipidemia, unspecified: Secondary | ICD-10-CM | POA: Diagnosis not present

## 2024-07-12 DIAGNOSIS — M4626 Osteomyelitis of vertebra, lumbar region: Secondary | ICD-10-CM | POA: Diagnosis not present

## 2024-07-12 DIAGNOSIS — I4811 Longstanding persistent atrial fibrillation: Secondary | ICD-10-CM | POA: Diagnosis not present

## 2024-07-12 DIAGNOSIS — Z452 Encounter for adjustment and management of vascular access device: Secondary | ICD-10-CM | POA: Diagnosis not present

## 2024-07-12 DIAGNOSIS — I129 Hypertensive chronic kidney disease with stage 1 through stage 4 chronic kidney disease, or unspecified chronic kidney disease: Secondary | ICD-10-CM | POA: Diagnosis not present

## 2024-07-12 NOTE — Telephone Encounter (Signed)
 [facility] home [facility] form faxed 07/12/2024

## 2024-07-13 LAB — LAB REPORT - SCANNED: EGFR: 73

## 2024-07-17 ENCOUNTER — Inpatient Hospital Stay: Admitting: Internal Medicine

## 2024-07-26 ENCOUNTER — Other Ambulatory Visit: Payer: Self-pay | Admitting: Internal Medicine

## 2024-07-26 DIAGNOSIS — E119 Type 2 diabetes mellitus without complications: Secondary | ICD-10-CM

## 2024-07-29 ENCOUNTER — Other Ambulatory Visit

## 2024-07-30 NOTE — Assessment & Plan Note (Signed)
 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 -He is clinically doing well, asymptomatic.  Surgical resection was discussed with patient, but he would like to try nonsurgical options. -He started Keytruda  on 01/12/23, stopped in June 2024 due to disease progression. 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 -I recommended neoadjuvant chemotherapy with cisplatin  and gemcitabine  for 4 cycles, followed by total cystectomy and prostatectomy. -He started chemotherapy on June 08, 2023, and completed on 08/17/2023, overall tolerated well  -He underwent right robotic right nephroureterectomy, robotic radical cystectomy with total urethrectomy, bilateral pelvic lymph node dissection, and left cutaneous ureterostomy by Dr. Alvaro on 12/08/2023.. surgical path showed multifocal high-grade urothelial carcinoma, right renal pelvis, ureter, bladder, and prostatic urethra, surgical margines negative, 1 positive node  -He started adjuvant Nivo on 02/15/2024 -CT 03/27/2024 showed no radiographic evidence of malignancy

## 2024-07-31 ENCOUNTER — Inpatient Hospital Stay: Attending: Hematology

## 2024-07-31 ENCOUNTER — Encounter: Payer: Self-pay | Admitting: Internal Medicine

## 2024-07-31 ENCOUNTER — Ambulatory Visit: Admitting: Hematology

## 2024-07-31 ENCOUNTER — Inpatient Hospital Stay

## 2024-07-31 VITALS — BP 124/68 | HR 89 | Temp 98.2°F | Resp 18 | Ht 75.0 in | Wt 184.4 lb

## 2024-07-31 DIAGNOSIS — Z79899 Other long term (current) drug therapy: Secondary | ICD-10-CM | POA: Diagnosis not present

## 2024-07-31 DIAGNOSIS — Z5112 Encounter for antineoplastic immunotherapy: Secondary | ICD-10-CM | POA: Diagnosis not present

## 2024-07-31 DIAGNOSIS — C675 Malignant neoplasm of bladder neck: Secondary | ICD-10-CM | POA: Insufficient documentation

## 2024-07-31 DIAGNOSIS — C679 Malignant neoplasm of bladder, unspecified: Secondary | ICD-10-CM

## 2024-07-31 DIAGNOSIS — M869 Osteomyelitis, unspecified: Secondary | ICD-10-CM | POA: Insufficient documentation

## 2024-07-31 LAB — CMP (CANCER CENTER ONLY)
ALT: 18 U/L (ref 0–44)
AST: 14 U/L — ABNORMAL LOW (ref 15–41)
Albumin: 4.3 g/dL (ref 3.5–5.0)
Alkaline Phosphatase: 93 U/L (ref 38–126)
Anion gap: 8 (ref 5–15)
BUN: 25 mg/dL — ABNORMAL HIGH (ref 8–23)
CO2: 25 mmol/L (ref 22–32)
Calcium: 10.5 mg/dL — ABNORMAL HIGH (ref 8.9–10.3)
Chloride: 104 mmol/L (ref 98–111)
Creatinine: 1.17 mg/dL (ref 0.61–1.24)
GFR, Estimated: >60 mL/min (ref >60)
Glucose, Bld: 179 mg/dL — ABNORMAL HIGH (ref 70–99)
Potassium: 3.9 mmol/L (ref 3.5–5.1)
Sodium: 137 mmol/L (ref 135–145)
Total Bilirubin: 0.3 mg/dL (ref 0.0–1.2)
Total Protein: 7.9 g/dL (ref 6.5–8.1)

## 2024-07-31 LAB — CBC WITH DIFFERENTIAL (CANCER CENTER ONLY)
Abs Immature Granulocytes: 0.05 K/uL (ref 0.00–0.07)
Basophils Absolute: 0.1 K/uL (ref 0.0–0.1)
Basophils Relative: 1 %
Eosinophils Absolute: 0.1 K/uL (ref 0.0–0.5)
Eosinophils Relative: 1 %
HCT: 41.1 % (ref 39.0–52.0)
Hemoglobin: 14 g/dL (ref 13.0–17.0)
Immature Granulocytes: 1 %
Lymphocytes Relative: 29 %
Lymphs Abs: 2.4 K/uL (ref 0.7–4.0)
MCH: 27.2 pg (ref 26.0–34.0)
MCHC: 34.1 g/dL (ref 30.0–36.0)
MCV: 79.8 fL — ABNORMAL LOW (ref 80.0–100.0)
Monocytes Absolute: 0.6 K/uL (ref 0.1–1.0)
Monocytes Relative: 7 %
Neutro Abs: 5.1 K/uL (ref 1.7–7.7)
Neutrophils Relative %: 61 %
Platelet Count: 175 K/uL (ref 150–400)
RBC: 5.15 MIL/uL (ref 4.22–5.81)
RDW: 17.1 % — ABNORMAL HIGH (ref 11.5–15.5)
WBC Count: 8.2 K/uL (ref 4.0–10.5)
nRBC: 0 % (ref 0.0–0.2)

## 2024-07-31 LAB — TSH: TSH: 1.05 u[IU]/mL (ref 0.350–4.500)

## 2024-07-31 MED ORDER — NIVOLUMAB-HYALURONIDASE-NVHY 600-10000 MG-UT/5ML ~~LOC~~ SOLN
1200.0000 mg | Freq: Once | SUBCUTANEOUS | Status: AC
  Administered 2024-07-31: 1200 mg via SUBCUTANEOUS
  Filled 2024-07-31: qty 10

## 2024-07-31 NOTE — Patient Instructions (Signed)
 [facility] - A DEPT OF [facility]  Discharge Instructions: Thank you for choosing [facility] to provide your oncology and hematology care.   If you have a lab appointment with the Cancer Center, please go directly to the Cancer Center and check in at the registration area.   Wear comfortable clothing and clothing appropriate for easy access to any Portacath or PICC line.   We strive to give you quality time with your provider. You may need to reschedule your appointment if you arrive late (15 or more minutes).  Arriving late affects you and other patients whose appointments are after yours.  Also, if you miss three or more appointments without notifying the office, you may be dismissed from the clinic at the provider's discretion.      For prescription refill requests, have your pharmacy contact our office and allow 72 hours for refills to be completed.    Today you received the following chemotherapy and/or immunotherapy agents: Nivolumab  (opdivo  qvantig)      To help prevent nausea and vomiting after your treatment, we encourage you to take your nausea medication as directed.  BELOW ARE SYMPTOMS THAT SHOULD BE REPORTED IMMEDIATELY: *FEVER GREATER THAN 100.4 F (38 C) OR HIGHER *CHILLS OR SWEATING *NAUSEA AND VOMITING THAT IS NOT CONTROLLED WITH YOUR NAUSEA MEDICATION *UNUSUAL SHORTNESS OF BREATH *UNUSUAL BRUISING OR BLEEDING *URINARY PROBLEMS (pain or burning when urinating, or frequent urination) *BOWEL PROBLEMS (unusual diarrhea, constipation, pain near the anus) TENDERNESS IN MOUTH AND THROAT WITH OR WITHOUT PRESENCE OF ULCERS (sore throat, sores in mouth, or a toothache) UNUSUAL RASH, SWELLING OR PAIN  UNUSUAL VAGINAL DISCHARGE OR ITCHING   Items with * indicate a potential emergency and should be followed up as soon as possible or go to the Emergency Department if any problems should occur.  Please show the CHEMOTHERAPY ALERT CARD  or IMMUNOTHERAPY ALERT CARD at check-in to the Emergency Department and triage nurse.  Should you have questions after your visit or need to cancel or reschedule your appointment, please contact [facility] - A DEPT OF [facility]  Dept: [phone]  and follow the prompts.  Office hours are 8:00 a.m. to 4:30 p.m. Monday - Friday. Please note that voicemails left after 4:00 p.m. may not be returned until the following business day.  We are closed weekends and major holidays. You have access to a nurse at all times for urgent questions. Please call the main number to the clinic Dept: [phone] and follow the prompts.   For any non-urgent questions, you may also contact your provider using MyChart. We now offer e-Visits for anyone 81 and older to request care online for non-urgent symptoms. For details visit [email or web address].   Also download the MyChart app! Go to the app store, search MyChart, open the app, select [place], and log in with your MyChart username and password.

## 2024-07-31 NOTE — Progress Notes (Signed)
 [facility]   [phone] Fax:([phone]   Clinic Follow up Note   Patient Care Team: Plotnikov, Karlynn GAILS, MD as PCP - General (Internal Medicine) Nahser, Aleene PARAS, MD (Inactive) as PCP - Cardiology (Cardiology) Liane Sharyne MATSU, [facility] (Inactive) as Pharmacist (Pharmacist) Trixie File, MD as Consulting Physician (Internal Medicine) Nieves Cough, MD as Consulting Physician (Urology) Avram Lupita BRAVO, MD as Consulting Physician (Gastroenterology) Patrcia Sharper, MD as Consulting Physician (Ophthalmology) Lanny Callander, MD as Attending Physician (Hematology and Oncology) Prentiss Heddy HERO, RN as [facility], [place], [place] as [facility] Care Management  Date of Service:  07/31/2024  CHIEF COMPLAINT: f/u of bladder cancer  CURRENT THERAPY:  Adjuvant nivolumab  every 4 weeks  Oncology History   Bladder cancer [facility]) Bladder cancer (HCC) -cT2N0M0 stage II diagnosed in 04/2023 -He has had 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 -He is clinically doing well, asymptomatic.  Surgical resection was discussed with patient, but he would like to try nonsurgical options. -He started Keytruda  on 01/12/23, stopped in June 2024 due to disease progression. 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 -I recommended neoadjuvant chemotherapy with cisplatin  and gemcitabine  for 4 cycles, followed by total cystectomy and  prostatectomy. -He started chemotherapy on June 08, 2023, and completed on 08/17/2023, overall tolerated well  -He underwent right robotic right nephroureterectomy, robotic radical cystectomy with total urethrectomy, bilateral pelvic lymph node dissection, and left cutaneous ureterostomy by Dr. Alvaro on 12/08/2023.. surgical path showed multifocal high-grade urothelial carcinoma, right renal pelvis, ureter, bladder, and prostatic urethra, surgical margines negative, 1 positive node  -He started adjuvant Nivo on 02/15/2024 -CT 03/27/2024 showed no radiographic evidence of malignancy   Assessment & Plan Bladder cancer, status post chemo and surgery, no evidence of recurrence Bladder cancer is in remission with no evidence of recurrence on recent imaging. The last CT scan of the abdomen in June showed no new findings. - Schedule CT scan of the abdomen in December - Continue current treatment regimen with infusions every four weeks  Osteomyelitis under active treatment Osteomyelitis is under treatment with oral antibiotics following a five-week course of IV antibiotics. The infection is improving as indicated by the resolution of anemia and normalization of blood counts. He is under the care of a spine doctor and is awaiting further evaluation once the infection is resolved. - Continue oral antibiotics as prescribed - Follow up with spine doctor for further management once infection is resolved  Resolved anemia secondary to infection Anemia secondary to infection has resolved, as indicated by normal blood counts. The resolution is likely due to the control of the underlying infection.  Plan - He is clinically stable, back pain is manageable.  He will continue follow-up with ID and neurosurgeon - Labs reviewed, adequate for treatment, will proceed nivolumab  injection today and continue every 4 weeks - Follow-up in 4 weeks - He will also follow-up with his urologist for surveillance   SUMMARY OF  ONCOLOGIC HISTORY: Oncology History Overview Note   Cancer  Staging  Bladder cancer [facility]) Staging form: Urinary Bladder, AJCC 8th Edition - Clinical stage from 05/14/2021: Stage 0a (cTa, cN0, cM0) - Signed by Lanny Callander, MD on 01/02/2023 Stage prefix: Initial diagnosis WHO/ISUP grade (low/high): High Grade Histologic grading system: 2 grade system - Pathologic stage from 02/22/2022: Stage I (pT1, pN0, cM0) - Signed by Lanny Callander, MD on 01/02/2023 WHO/ISUP grade (low/high): High Grade Histologic grading system: 2 grade system     Bladder cancer (HCC)  05/14/2021 Cancer Staging   Staging form: Urinary Bladder, AJCC 8th Edition - Clinical stage from 05/14/2021: Stage 0a (cTa, cN0, cM0) - Signed by Lanny Callander, MD on 01/02/2023 Stage prefix: Initial diagnosis WHO/ISUP grade (low/high): High Grade Histologic grading system: 2 grade system   01/31/2022 Initial Diagnosis   Bladder cancer (HCC)   02/22/2022 Cancer Staging   Staging form: Urinary Bladder, AJCC 8th Edition - Pathologic stage from 02/22/2022: Stage I (pT1, pN0, cM0) - Signed by Lanny Callander, MD on 01/02/2023 WHO/ISUP grade (low/high): High Grade Histologic grading system: 2 grade system   01/12/2023 - 04/06/2023 Chemotherapy   Patient is on Treatment Plan : BLADDER Pembrolizumab  (200) q21d     05/05/2023 Pathology Results     FINAL MICROSCOPIC DIAGNOSIS:   A. BLADDER, RIGHT POSTERIOR, TURBT:  Noninvasive high grade papillary urothelial carcinoma  Muscularis propria (detrusor muscle) is not present   B. BLADDER NECK AND PROSTATIC URETHRA, BIOPSY:  High grade urothelial carcinoma  The carcinoma focally invades muscularis propria (detrusor muscle)     05/05/2023 Cancer Staging   Staging form: Urinary Bladder, AJCC 8th Edition - Clinical stage from 05/05/2023: Stage II (cT2, cN0, cM0) - Signed by Lanny Callander, MD on 06/15/2023 WHO/ISUP grade (low/high): High Grade Histologic grading system: 2 grade system   06/01/2023 Imaging   CT Chest,  Abdomen, and Pelvis with contrast  IMPRESSION: 1. Similar irregular asymmetric wall thickening along the posterior urinary bladder with progressive irregular thickening of the right ureter extending to the level of the renal pelvis where there is now a masslike 16 x 12 mm soft tissue focus, suspicious for direct ureteral extension of disease into the renal pelvis. 2. Irregular asymmetric wall thickening along the posterior urinary bladder measuring 14 mm is similar prior. 3. Right double-J ureteral stent is in place with persistent perinephric/periureteric stranding without significant hydronephrosis. 4. No convincing evidence of metastatic disease in the chest, abdomen or pelvis 5. Pancolonic diverticulosis without findings of acute diverticulitis. 6. Aortic Atherosclerosis (ICD10-I70.0) and Emphysema (ICD10-J43.9).   06/09/2023 - 08/17/2023 Chemotherapy   Patient is on Treatment Plan : BLADDER Cisplatin  D1 + Gemcitabine  D1,8 q21d x 4 Cycles     02/15/2024 -  Chemotherapy   Patient is on Treatment Plan : BLADDER Nivolumab  (480) q28d        Discussed the use of AI scribe software for clinical note transcription with the patient, who gave verbal consent to proceed.  History of Present Illness Robert [name] is a 73 year old male with kidney cancer who presents for follow-up.  He experiences ongoing back pain and is currently managed with Lyrica . A bone infection is being treated with oral antibiotics following a five-week course of IV antibiotics. He has no new symptoms such as rash, joint pain, or gastrointestinal issues. A CT scan of the abdomen in June showed no evidence of cancer recurrence, with another scan planned for December. He is scheduled for a urology appointment on the 20th for stent placement and x-rays.  All other systems were reviewed with the patient and are negative.  MEDICAL HISTORY:  Past Medical History:  Diagnosis Date   Anticoagulant long-term use     eliquis --- managed by cardiology   Aortic atherosclerosis 05/30/2024   Arthritis    Bladder cancer [facility])    urologist--- dr nieves;  overlapping   Cataract    History of basal cell carcinoma (BCC) excision    History of COVID-19 05/2019   per pt mild symptoms that resolved   HLD (hyperlipidemia)    HTN (hypertension)    Hx of colonic polyps    Insulin  dependent type 2 diabetes mellitus [facility])    endocrinologist--- dr trixie --  (03-11-2022 per pt check blood sugar 1-2 times daily,  fasting sugar-- 118-120s)   Nocturia    Paroxysmal atrial fibrillation (HCC) 03/12/2019   cardiologist--- dr calhoun   Renal disorder    Ureterocele    Use of cane as ambulatory aid     SURGICAL HISTORY: Past Surgical History:  Procedure Laterality Date   BLEPHAROPLASTY Bilateral    per pt approx  2001;   upper eyelid's   COLONOSCOPY     last one approx 2023   CYSTOSCOPY W/ RETROGRADES N/A 12/08/2023   Procedure: CYSTOSCOPY;  Surgeon: Alvaro Ricardo KATHEE Mickey., MD;  Location: [facility];  Service: Urology;  Laterality: N/A;  390 MINUTES NEEDED FOR CASE   CYSTOSCOPY WITH URETEROSCOPY AND STENT PLACEMENT Right 12/02/2022   Procedure: CYSTOSCOPY WITH RIGHT URETEROSCOPY, RIGHT DIAGNOSTIC URETEROSCOPY;  Surgeon: nieves Cough, MD;  Location: [facility];  Service: Urology;  Laterality: Right;  90 MINS FOR CASE   CYSTOSCOPY/RETROGRADE/URETEROSCOPY Bilateral 05/05/2023   Procedure: CYSTOSCOPY BILATERAL RETROGRADE PYELOGRAM RIGHT URETEROSCOPY RIGHT STENT PLACEMENT;  Surgeon: nieves Cough, MD;  Location: [facility];  Service: Urology;  Laterality: Bilateral;  90 MINS FOR CASE   IR IMAGING GUIDED PORT INSERTION  05/30/2023   IR LUMBAR DISC ASPIRATION W/IMG GUIDE  05/12/2024   IR REMOVAL TUN ACCESS W/ PORT W/O FL MOD SED  03/29/2024   PATELLAR TENDON REPAIR Left 04/16/2004   @[facility]    ROBOT ASSITED LAPAROSCOPIC NEPHROURETERECTOMY Right 12/08/2023   Procedure: RIGHT ROBOT ASSISTED Nephroureterectomy;   Surgeon: Alvaro Ricardo KATHEE Mickey., MD;  Location: [facility];  Service: Urology;  Laterality: Right;   ROBOT LAP RADICAL CYSTOPROSTATECTOMY PELVIC LYMPHADENECTOMY, NEOBLADDER  12/08/2023   Procedure: ROBOT ASSISTED LAPAROSCOPIC RADICAL CYSTOPROSTATECTOMY;  Surgeon: Alvaro Ricardo KATHEE Mickey., MD;  Location: [facility];  Service: Urology;;   TOTAL URETHRECTOMY,RADICAL N/A 12/08/2023   Procedure: TOTAL URETHRECTOMY,RADICAL;  Surgeon: Alvaro Ricardo KATHEE Mickey., MD;  Location: [facility];  Service: Urology;  Laterality: N/A;   TRANSESOPHAGEAL ECHOCARDIOGRAM (CATH LAB) N/A 04/02/2024   Procedure: TRANSESOPHAGEAL ECHOCARDIOGRAM;  Surgeon: Delford Maude BROCKS, MD;  Location: [facility] INVASIVE CV LAB;  Service: Cardiovascular;  Laterality: N/A;   TRANSURETHRAL RESECTION OF BLADDER TUMOR N/A 06/18/2021   Procedure: TRANSURETHRAL RESECTION OF BLADDER TUMOR (TURBT) WITH RIGHT URETERAL STENT PLACEMENT, RIGHT URETEROSCOPY WITH DISTRACTION OF TUMOR, FULGURATION;  Surgeon: nieves Cough, MD;  Location: [facility];  Service: Urology;  Laterality: N/A;   TRANSURETHRAL RESECTION OF BLADDER TUMOR N/A 03/15/2022   Procedure: TRANSURETHRAL RESECTION OF BLADDER TUMOR (TURBT) WITH BILATERAL URETEROSCOPY/URETHRAL/ DILATION/BILATERAL RETROGRADE PYELOGRAM/BIOPSY AND FULGURATION OF RIGHT URETERAL CANCER/RIGHT STENT PLACEMENT;  Surgeon: nieves Cough, MD;  Location: [facility];  Service: Urology;  Laterality: N/A;   TRANSURETHRAL RESECTION OF BLADDER TUMOR N/A 05/05/2023   Procedure: TRANSURETHRAL RESECTION OF BLADDER TUMOR (TURBT) BLADDER  BIOSPY;  Surgeon: Nieves Cough, MD;  Location: [facility];  Service: Urology;  Laterality: N/A;   TRANSURETHRAL RESECTION OF BLADDER TUMOR WITH MITOMYCIN -C N/A 12/02/2022   Procedure: TRANSURETHRAL RESECTION OF BLADDER TUMOR;  Surgeon: Nieves Cough, MD;  Location: [facility];  Service: Urology;  Laterality: N/A;   TRANSURETHRAL RESECTION OF PROSTATE  05/05/2023    Procedure: TRANSURETHRAL RESECTION OF THE PROSTATE (TURP);  Surgeon: Nieves Cough, MD;  Location: [facility];  Service: Urology;;    I have reviewed the social history and family history with the patient and they are unchanged from previous note.  ALLERGIES:  is allergic to lisinopril  and lyrica  [pregabalin ].  MEDICATIONS:  Current Outpatient Medications  Medication Sig Dispense Refill   amoxicillin  (AMOXIL ) 500 MG capsule Take 2 capsules (1,000 mg total) by mouth 3 (three) times daily. 180 capsule 1   apixaban  (ELIQUIS ) 5 MG TABS tablet Take 1 tablet (5 mg total) by mouth 2 (two) times daily. 180 tablet 1   atorvastatin  (LIPITOR) 40 MG tablet Take 1 tablet (40 mg total) by mouth at bedtime.     B Complex-C (B-COMPLEX WITH VITAMIN C) tablet Take 1 tablet by mouth 3 (three) times a week.     Cholecalciferol  (VITAMIN D -3 PO) Take 1 tablet by mouth 3 (three) times a week.     EPINEPHrine  0.3 mg/0.3 mL IJ SOAJ injection Inject 0.3 mg into the muscle as needed for anaphylaxis. (Patient not taking: Reported on 07/10/2024) 1 each 0   furosemide  (LASIX ) 20 MG tablet TAKE 1 TO 2 TABLETS BY MOUTH DAILY AS NEEDED (Patient not taking: Reported on 07/10/2024) 180 tablet 1   gabapentin  (NEURONTIN ) 300 MG capsule Take 300 mg by mouth 3 (three) times daily.     lidocaine  (LIDODERM ) 5 % Place 1 patch onto the skin daily. Remove & Discard patch within 12 hours or as directed by MD 30 patch 0   methocarbamol  (ROBAXIN ) 500 MG tablet Take 1 tablet (500 mg total) by mouth every 8 (eight) hours as needed for muscle spasms. 60 tablet 0   metoprolol  succinate (TOPROL -XL) 25 MG 24 hr tablet Take 1 tablet (25 mg total) by mouth daily. 90 tablet 2   oxyCODONE  (OXY IR/ROXICODONE ) 5 MG immediate release tablet Take 1 tablet (5 mg total) by mouth every 6 (six) hours as needed for moderate pain (pain score 4-6). 15 tablet 0   oxyCODONE  (OXYCONTIN ) 15 mg 12 hr tablet Take 1 tablet (15 mg total) by mouth  every 12 (twelve) hours. 10 tablet 0   polyethylene glycol (MIRALAX  / GLYCOLAX ) 17 g packet Take 17 g by mouth daily.     propranolol  (INDERAL ) 10 MG tablet TAKE 1 TABLET BY MOUTH 4 TIMES DAILY AS NEEDED FOR PALPITATIONS, IRREGULAR OR FAST HEART RATE 270 tablet 1   Semaglutide , 1 MG/DOSE, (OZEMPIC , 1 MG/DOSE,) 4 MG/3ML SOPN INJECT 1 MG UNDER THE SKIN ONCE A WEEK AS DIRECTED 9 mL 0   senna-docusate (SENOKOT-S) 8.6-50 MG tablet Take 1-2 tablets by mouth 2 (two) times daily between meals as needed for mild constipation or moderate constipation.     TYLENOL  500 MG tablet Take 1,000 mg by mouth daily as needed for mild pain (pain score 1-3), headache or moderate pain (pain score 4-6).     No current facility-administered medications for this visit.   Facility-Administered Medications Ordered in Other Visits  Medication Dose Route Frequency Provider Last Rate Last Admin   sodium chloride  flush (NS) 0.9 % injection  10 mL  10 mL Intracatheter PRN Lanny Callander, MD   10 mL at 02/15/24 1353    PHYSICAL EXAMINATION: ECOG PERFORMANCE STATUS: 2 - Symptomatic, <50% confined to bed  Vitals:   07/31/24 1349  BP: 124/68  Pulse: 89  Resp: 18  Temp: 98.2 F (36.8 C)  SpO2: 98%   Wt Readings from Last 3 Encounters:  07/31/24 184 lb 6.4 oz (83.6 kg)  07/10/24 186 lb (84.4 kg)  07/03/24 183 lb 6.4 oz (83.2 kg)     GENERAL:alert, no distress, thin with significant muscle loss SKIN: skin color, texture, turgor are normal, no rashes or significant lesions EYES: normal, Conjunctiva are pink and non-injected, sclera clear Musculoskeletal:no cyanosis of digits and no clubbing  NEURO: alert & oriented x 3 with fluent speech, no focal motor/sensory deficits  Physical Exam    LABORATORY DATA:  I have reviewed the data as listed    Latest Ref Rng & Units 07/31/2024    1:16 PM 07/03/2024    2:00 PM 06/04/2024    4:37 AM  CBC  WBC 4.0 - 10.5 K/uL 8.2  9.4  5.8   Hemoglobin 13.0 - 17.0 g/dL 85.9  88.8  9.0    Hematocrit 39.0 - 52.0 % 41.1  34.6  28.4   Platelets 150 - 400 K/uL 175  215  150         Latest Ref Rng & Units 07/31/2024    1:16 PM 07/03/2024    2:00 PM 06/04/2024    4:37 AM  CMP  Glucose 70 - 99 mg/dL 820  816  823   BUN 8 - 23 mg/dL 25  15  19    Creatinine 0.61 - 1.24 mg/dL 8.82  8.84  8.88   Sodium 135 - 145 mmol/L 137  138  135   Potassium 3.5 - 5.1 mmol/L 3.9  4.2  3.9   Chloride 98 - 111 mmol/L 104  104  104   CO2 22 - 32 mmol/L 25  27  25    Calcium  8.9 - 10.3 mg/dL 89.4  9.4  8.6   Total Protein 6.5 - 8.1 g/dL 7.9  7.4    Total Bilirubin 0.0 - 1.2 mg/dL 0.3  0.3    Alkaline Phos 38 - 126 U/L 93  90    AST 15 - 41 U/L 14  12    ALT 0 - 44 U/L 18  12        RADIOGRAPHIC STUDIES: I have personally reviewed the radiological images as listed and agreed with the findings in the report. No results found.    Orders Placed This Encounter  Procedures   CBC with Differential (Cancer Center Only)    Standing Status:   Future    Expected Date:   10/23/2024    Expiration Date:   10/23/2025   CMP (Cancer Center only)    Standing Status:   Future    Expected Date:   10/23/2024    Expiration Date:   10/23/2025   T4    Standing Status:   Future    Expected Date:   10/23/2024    Expiration Date:   10/23/2025   TSH    Standing Status:   Future    Expected Date:   10/23/2024    Expiration Date:   10/23/2025   All questions were answered. The patient knows to call the clinic with any problems, questions or concerns. No barriers to learning was detected. The total time spent  in the appointment was 25 minutes, including review of chart and various tests results, discussions about plan of care and coordination of care plan     Onita Mattock, MD 07/31/2024

## 2024-08-01 LAB — T4: T4, Total: 7.4 ug/dL (ref 4.5–12.0)

## 2024-08-08 ENCOUNTER — Encounter: Payer: Self-pay | Admitting: Internal Medicine

## 2024-08-08 ENCOUNTER — Ambulatory Visit: Admitting: Internal Medicine

## 2024-08-08 ENCOUNTER — Other Ambulatory Visit: Payer: Self-pay

## 2024-08-08 ENCOUNTER — Telehealth: Payer: Self-pay

## 2024-08-08 VITALS — BP 134/86 | HR 91 | Temp 98.0°F | Wt 186.0 lb

## 2024-08-08 DIAGNOSIS — M4807 Spinal stenosis, lumbosacral region: Secondary | ICD-10-CM | POA: Diagnosis not present

## 2024-08-08 DIAGNOSIS — M4640 Discitis, unspecified, site unspecified: Secondary | ICD-10-CM | POA: Diagnosis not present

## 2024-08-08 DIAGNOSIS — M4620 Osteomyelitis of vertebra, site unspecified: Secondary | ICD-10-CM

## 2024-08-08 DIAGNOSIS — M4647 Discitis, unspecified, lumbosacral region: Secondary | ICD-10-CM | POA: Diagnosis not present

## 2024-08-08 DIAGNOSIS — M48061 Spinal stenosis, lumbar region without neurogenic claudication: Secondary | ICD-10-CM | POA: Diagnosis not present

## 2024-08-08 DIAGNOSIS — M4626 Osteomyelitis of vertebra, lumbar region: Secondary | ICD-10-CM | POA: Diagnosis not present

## 2024-08-08 DIAGNOSIS — M4646 Discitis, unspecified, lumbar region: Secondary | ICD-10-CM | POA: Diagnosis not present

## 2024-08-08 NOTE — Progress Notes (Signed)
 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0-29.9) 01/03/2019   Type 2 diabetes mellitus without complication, without long-term current use of insulin  (HCC) 07/23/2018   BPH associated with nocturia 03/01/2017   Irregular heart rate 06/23/2011   Hx of colonic polyps    ERECTILE DYSFUNCTION 12/19/2008   SKIN CANCER, HX OF 12/19/2008   Hyperlipidemia 08/21/2007   Essential hypertension 08/21/2007    Patient's Medications  New Prescriptions   No medications on file  Previous Medications   AMOXICILLIN  (AMOXIL ) 500 MG CAPSULE    Take 2 capsules (1,000 mg total) by mouth 3 (three) times daily.   APIXABAN  (ELIQUIS ) 5 MG TABS TABLET    Take 1 tablet (5 mg total) by mouth 2 (two) times daily.   ATORVASTATIN  (LIPITOR) 40 MG TABLET    Take 1 tablet (40 mg total) by mouth at bedtime.   B COMPLEX-C (B-COMPLEX WITH VITAMIN C) TABLET    Take 1 tablet by mouth 3 (three) times a week.   CHOLECALCIFEROL  (VITAMIN D -3 PO)    Take 1 tablet by mouth 3 (three) times a week.   EPINEPHRINE  0.3 MG/0.3 ML IJ SOAJ INJECTION     Inject 0.3 mg into the muscle as needed for anaphylaxis.   FUROSEMIDE  (LASIX ) 20 MG TABLET    TAKE 1 TO 2 TABLETS BY MOUTH DAILY AS NEEDED   GABAPENTIN  (NEURONTIN ) 300 MG CAPSULE    Take 300 mg by mouth 3 (three) times daily.   LIDOCAINE  (LIDODERM ) 5 %    Place 1 patch onto the skin daily. Remove & Discard patch within 12 hours or as directed by MD   METHOCARBAMOL  (ROBAXIN ) 500 MG TABLET    Take 1 tablet (500 mg total) by mouth every 8 (eight) hours as needed for muscle spasms.   METOPROLOL  SUCCINATE (TOPROL -XL) 25 MG 24 HR TABLET    Take 1 tablet (25 mg total) by mouth daily.   OXYCODONE  (OXY IR/ROXICODONE ) 5 MG IMMEDIATE RELEASE TABLET    Take 1 tablet (5 mg total) by mouth every 6 (six) hours as needed for moderate pain (pain score 4-6).   OXYCODONE  (OXYCONTIN ) 15 MG 12 HR TABLET    Take 1 tablet (15 mg total) by mouth every 12 (twelve) hours.   POLYETHYLENE GLYCOL (MIRALAX  / GLYCOLAX ) 17 G PACKET    Take 17 g by mouth daily.   PROPRANOLOL  (INDERAL ) 10 MG TABLET    TAKE 1 TABLET BY MOUTH 4 TIMES DAILY AS NEEDED FOR PALPITATIONS, IRREGULAR OR FAST HEART RATE   SEMAGLUTIDE , 1 MG/DOSE, (OZEMPIC , 1 MG/DOSE,) 4 MG/3ML SOPN    INJECT 1 MG UNDER  THE SKIN ONCE A WEEK AS DIRECTED   SENNA-DOCUSATE (SENOKOT-S) 8.6-50 MG TABLET    Take 1-2 tablets by mouth 2 (two) times daily between meals as needed for mild constipation or moderate constipation.   TYLENOL  500 MG TABLET    Take 1,000 mg by mouth daily as needed for mild pain (pain score 1-3), headache or moderate pain (pain score 4-6).  Modified Medications   No medications on file  Discontinued Medications   No medications on file    Subjective: 73 year old male with past medical history of bladder cancer status post chemotherapy, cystoscopy prostatectomy, right nephroureterectomy,total urethrectomy now on nivolumab  every 4 weeks, admission in April for Serratia marcessens UTI/bacteremia treated with Cipro  and amoxicillin  x 2 weeks EOT 4/27, recent  admission 5 9-5 10 for AKI underwent stent placement presents for hospital follow-up of E faecalis bacteremia secondary to UTI.  Patient had presented with worsening back pain x 1 day.  MRI lumbar spine showed no concern for infection.  Urine and blood cultures grew E faecalis.  Blood culture 2/2 grew E faecalis.  CT renal study showed stent in left ureter mild to moderate hydronephrosis.  Urine was obtained from ostomy.  TTE and TEE without vegetation.  Patient will discharge with amoxicillin  x 2 weeks from port removal.  EOT 6/20.  Present today for follow-up and surveillance blood cultures. Seen by urology reecc oupatint f/u. Today: reports tolerating abx. Awaiting MRI results done today Review of Systems: Review of Systems  All other systems reviewed and are negative.   Past Medical History:  Diagnosis Date   Anticoagulant long-term use    eliquis --- managed by cardiology   Aortic atherosclerosis 05/30/2024   Arthritis    Bladder cancer [facility])    urologist--- dr nieves;  overlapping   Cataract    History of basal cell carcinoma (BCC) excision    History of COVID-19 05/2019   per pt mild symptoms that resolved   HLD (hyperlipidemia)    HTN (hypertension)    Hx of colonic polyps    Insulin  dependent type 2 diabetes mellitus [facility])    endocrinologist--- dr trixie --  (03-11-2022 per pt check blood sugar 1-2 times daily,  fasting sugar-- 118-120s)   Nocturia    Paroxysmal atrial fibrillation (HCC) 03/12/2019   cardiologist--- dr calhoun   Renal disorder    Ureterocele    Use of cane as ambulatory aid     Social History   Tobacco Use   Smoking status: Some Days    Current packs/day: 0.25    Average packs/day: 0.3 packs/day for 13.8 years (3.7 ttl pk-yrs)    Types: Cigars, Cigarettes    Start date: 2012    Last attempt to quit: 02/22/2011   Smokeless tobacco: Never   Tobacco comments:    Smoking cessation instruction/counseling given:  counseled patient on the dangers of  tobacco use, advised patient to stop smoking, and reviewed strategies to maximize success   Vaping Use   Vaping status: Never Used  Substance Use Topics   Alcohol  use: Yes    Alcohol /week: 3.0 standard drinks of alcohol     Types: 3 Shots of liquor per week    Comment: 6-8oz /wk OCC MARTINI 2 TO 3 X WEEK   Drug use: Never    Family History  Problem Relation Age of Onset   Pancreatic cancer Mother 81   Cancer Paternal Uncle        unknown type cancer   Aortic aneurysm Other  family history   Cancer Other        Breast (1st degree relative)   Colon cancer Neg Hx    Rectal cancer Neg Hx    Stomach cancer Neg Hx    Esophageal cancer Neg Hx     Allergies  Allergen Reactions   Lisinopril  Swelling and Other (See Comments)    Tongue and throat became swollen   Lyrica  [Pregabalin ] Swelling and Other (See Comments)    Patient noticed swelling of the ankles and feet when taking this, so he stopped using it.    Health Maintenance  Topic Date Due   Hepatitis C Screening  Never done   DTaP/Tdap/Td (2 - Tdap) 08/20/2017   Influenza Vaccine  05/24/2024   FOOT EXAM  06/11/2024   COVID-19 Vaccine (4 - 2025-26 season) 06/24/2024   HEMOGLOBIN A1C  12/01/2024   Diabetic kidney evaluation - Urine ACR  02/12/2025   Medicare Annual Wellness (AWV)  04/08/2025   OPHTHALMOLOGY EXAM  05/02/2025   Diabetic kidney evaluation - eGFR measurement  07/31/2025   Colonoscopy  06/18/2027   Pneumococcal Vaccine: 50+ Years  Completed   Meningococcal B Vaccine  Aged Out   Zoster Vaccines- Shingrix  Discontinued    Objective:  Vitals:   08/08/24 1331  Weight: 186 lb (84.4 kg)   Body mass index is 23.25 kg/m.  Physical Exam Constitutional:      General: He is not in acute distress.    Appearance: He is normal weight. He is not toxic-appearing.  HENT:     Head: Normocephalic and atraumatic.     Right Ear: External ear normal.     Left Ear: External ear normal.     Nose: No congestion  or rhinorrhea.     Mouth/Throat:     Mouth: Mucous membranes are moist.     Pharynx: Oropharynx is clear.  Eyes:     Extraocular Movements: Extraocular movements intact.     Conjunctiva/sclera: Conjunctivae normal.     Pupils: Pupils are equal, round, and reactive to light.  Cardiovascular:     Rate and Rhythm: Normal rate and regular rhythm.     Heart sounds: No murmur heard.    No friction rub. No gallop.  Pulmonary:     Effort: Pulmonary effort is normal.     Breath sounds: Normal breath sounds.  Abdominal:     General: Abdomen is flat. Bowel sounds are normal.     Palpations: Abdomen is soft.  Musculoskeletal:        General: No swelling.     Cervical back: Normal range of motion and neck supple.  Skin:    General: Skin is warm and dry.  Neurological:     General: No focal deficit present.     Mental Status: He is oriented to person, place, and time.  Psychiatric:        Mood and Affect: Mood normal.    Lab Results Lab Results  Component Value Date   WBC 8.2 07/31/2024   HGB 14.0 07/31/2024   HCT 41.1 07/31/2024   MCV 79.8 (L) 07/31/2024   PLT 175 07/31/2024    Lab Results  Component Value Date   CREATININE 1.17 07/31/2024   BUN 25 (H) 07/31/2024   NA 137 07/31/2024   K 3.9 07/31/2024   CL 104 07/31/2024   CO2 25 07/31/2024    Lab Results  Component Value Date   ALT 18 07/31/2024   AST 14 (L) 07/31/2024   ALKPHOS  93 07/31/2024   BILITOT 0.3 07/31/2024    Lab Results  Component Value Date   CHOL 116 02/27/2023   HDL 38.60 (L) 02/27/2023   LDLCALC 47 02/27/2023   LDLDIRECT 77.0 07/07/2015   TRIG 150.0 (H) 02/27/2023   CHOLHDL 3 02/27/2023   No results found for: LABRPR, RPRTITER No results found for: HIV1RNAQUANT, HIV1RNAVL, CD4TABS   Problem List Items Addressed This Visit   None  Results   Assessment/Plan #Back pain #Bladder cancer, gets nivolumab  -Pt initially admitted with e faecalis bacteremia treated with amox  x 2 weeks  from from port removal through 6/20 -Then admitted and found to have vertebral infection(sp disc aspiration on 7/20). MRI showed L4-L5 discitis and osteomyelitis with L5-S1 discitis and epidural phlegmon and he underwent a disc aspiration on 05/12/2024 which was E faecalis had a PICC placement and started on ampicillin  the plan to give it for at least 4 weeks until 06/10/2024. treated with amp x 8 weeks eot 9/15 given re-admission for  worsening back pain and progressive OM on mri .  - blood Cx in EPIC( one episode on e faecalis bacteremia in 2025 noted in June 2025, one episode of serratia marcescens noted in April 202).  -Seen on 9/17 with ID and amox 1gm tid till stent out.  -Requiring walker, although back pain better.  -If old stent removed and new placed with urology-> sotp amoxicllin -> plan on oR Monday then stop abx -07/31/24: cbc/smp-> stable, esr crp today -MRI today -> with NSY -amox till next visit.  -f/u in one week to assess stopping abx  Loney Stank, MD [facility] 08/08/2024, 1:33 PM   I have personally spent 52 minutes involved in face-to-face and non-face-to-face activities for this patient on the day of the visit. Professional time spent includes the following activities: Preparing to see the patient (review of tests), Obtaining and/or reviewing separately obtained history (admission/discharge record), Performing a medically appropriate examination and/or evaluation , Ordering medications/tests/procedures, referring and communicating with other health care professionals, Documenting clinical information in the EMR, Independently interpreting results (not separately reported), Communicating results to the patient/family/caregiver, Counseling and educating the patient/family/caregiver and Care coordination (not separately reported).

## 2024-08-08 NOTE — Patient Outreach (Signed)
 Complex Care Management   Visit Note  08/08/2024  Name:  ANTHON HARPOLE MRN: [id number] DOB: [birth year]  Situation: RNCM called for scheduled call today. Patient request to reschedule.   Follow Up Plan:   Telephone follow up appointment date/time:  08/22/24 at 11:00 am  Heddy Shutter, RN, MSN, BSN, CCM [place]  [facility], Population Health Case Manager Phone: [phone]

## 2024-08-09 ENCOUNTER — Encounter: Payer: Self-pay | Admitting: Internal Medicine

## 2024-08-09 LAB — SEDIMENTATION RATE: Sed Rate: 28 mm/h — ABNORMAL HIGH (ref 0–20)

## 2024-08-09 LAB — C-REACTIVE PROTEIN: CRP: 18.9 mg/L — ABNORMAL HIGH (ref <8.0)

## 2024-08-12 DIAGNOSIS — Z905 Acquired absence of kidney: Secondary | ICD-10-CM | POA: Diagnosis not present

## 2024-08-12 DIAGNOSIS — C678 Malignant neoplasm of overlapping sites of bladder: Secondary | ICD-10-CM | POA: Diagnosis not present

## 2024-08-12 DIAGNOSIS — C661 Malignant neoplasm of right ureter: Secondary | ICD-10-CM | POA: Diagnosis not present

## 2024-08-12 NOTE — Telephone Encounter (Signed)
 We can assess at 10/22 visit

## 2024-08-14 ENCOUNTER — Ambulatory Visit: Admitting: Internal Medicine

## 2024-08-15 ENCOUNTER — Encounter: Payer: Self-pay | Admitting: Internal Medicine

## 2024-08-20 DIAGNOSIS — Z936 Other artificial openings of urinary tract status: Secondary | ICD-10-CM | POA: Diagnosis not present

## 2024-08-20 DIAGNOSIS — C661 Malignant neoplasm of right ureter: Secondary | ICD-10-CM | POA: Diagnosis not present

## 2024-08-20 DIAGNOSIS — C61 Malignant neoplasm of prostate: Secondary | ICD-10-CM | POA: Diagnosis not present

## 2024-08-21 ENCOUNTER — Ambulatory Visit: Admitting: Internal Medicine

## 2024-08-22 ENCOUNTER — Telehealth: Payer: Self-pay

## 2024-08-22 DIAGNOSIS — M4646 Discitis, unspecified, lumbar region: Secondary | ICD-10-CM | POA: Diagnosis not present

## 2024-08-22 DIAGNOSIS — M25551 Pain in right hip: Secondary | ICD-10-CM | POA: Diagnosis not present

## 2024-08-22 NOTE — Patient Outreach (Signed)
 Complex Care Management   Visit Note  08/22/2024  Name:  Robert [name] MRN: [id number] DOB: [birth year]  Situation: RNCM called patient for scheduled follow up telephone call. [name] states this is not a good time to talk. He is on his way to an appointment and request to reschedule phone call.   Follow Up Plan:   Telephone follow up appointment date/time:  09/04/24 at 1:00 pm   Heddy Shutter, RN, MSN, BSN, CCM [place]  [facility], Population Health Case Manager Phone: [phone]

## 2024-08-28 ENCOUNTER — Inpatient Hospital Stay: Attending: Hematology

## 2024-08-28 ENCOUNTER — Inpatient Hospital Stay: Admitting: Hematology

## 2024-08-28 ENCOUNTER — Inpatient Hospital Stay

## 2024-08-28 VITALS — BP 130/77 | HR 66 | Resp 18

## 2024-08-28 VITALS — BP 132/74 | HR 83 | Temp 98.3°F | Resp 17 | Wt 185.7 lb

## 2024-08-28 DIAGNOSIS — I700 Atherosclerosis of aorta: Secondary | ICD-10-CM | POA: Diagnosis not present

## 2024-08-28 DIAGNOSIS — J439 Emphysema, unspecified: Secondary | ICD-10-CM | POA: Insufficient documentation

## 2024-08-28 DIAGNOSIS — M869 Osteomyelitis, unspecified: Secondary | ICD-10-CM | POA: Insufficient documentation

## 2024-08-28 DIAGNOSIS — N189 Chronic kidney disease, unspecified: Secondary | ICD-10-CM | POA: Diagnosis not present

## 2024-08-28 DIAGNOSIS — C675 Malignant neoplasm of bladder neck: Secondary | ICD-10-CM

## 2024-08-28 DIAGNOSIS — M1990 Unspecified osteoarthritis, unspecified site: Secondary | ICD-10-CM | POA: Insufficient documentation

## 2024-08-28 DIAGNOSIS — C679 Malignant neoplasm of bladder, unspecified: Secondary | ICD-10-CM

## 2024-08-28 DIAGNOSIS — Z79899 Other long term (current) drug therapy: Secondary | ICD-10-CM | POA: Insufficient documentation

## 2024-08-28 DIAGNOSIS — Z5112 Encounter for antineoplastic immunotherapy: Secondary | ICD-10-CM | POA: Insufficient documentation

## 2024-08-28 LAB — CBC WITH DIFFERENTIAL (CANCER CENTER ONLY)
Abs Immature Granulocytes: 0.07 K/uL (ref 0.00–0.07)
Basophils Absolute: 0.1 K/uL (ref 0.0–0.1)
Basophils Relative: 1 %
Eosinophils Absolute: 0.1 K/uL (ref 0.0–0.5)
Eosinophils Relative: 2 %
HCT: 42.7 % (ref 39.0–52.0)
Hemoglobin: 14.5 g/dL (ref 13.0–17.0)
Immature Granulocytes: 1 %
Lymphocytes Relative: 22 %
Lymphs Abs: 1.6 K/uL (ref 0.7–4.0)
MCH: 27.6 pg (ref 26.0–34.0)
MCHC: 34 g/dL (ref 30.0–36.0)
MCV: 81.3 fL (ref 80.0–100.0)
Monocytes Absolute: 0.6 K/uL (ref 0.1–1.0)
Monocytes Relative: 9 %
Neutro Abs: 4.7 K/uL (ref 1.7–7.7)
Neutrophils Relative %: 65 %
Platelet Count: 174 K/uL (ref 150–400)
RBC: 5.25 MIL/uL (ref 4.22–5.81)
RDW: 16.5 % — ABNORMAL HIGH (ref 11.5–15.5)
WBC Count: 7.2 K/uL (ref 4.0–10.5)
nRBC: 0 % (ref 0.0–0.2)

## 2024-08-28 LAB — CMP (CANCER CENTER ONLY)
ALT: 15 U/L (ref 0–44)
AST: 18 U/L (ref 15–41)
Albumin: 4.1 g/dL (ref 3.5–5.0)
Alkaline Phosphatase: 88 U/L (ref 38–126)
Anion gap: 6 (ref 5–15)
BUN: 24 mg/dL — ABNORMAL HIGH (ref 8–23)
CO2: 27 mmol/L (ref 22–32)
Calcium: 9.5 mg/dL (ref 8.9–10.3)
Chloride: 103 mmol/L (ref 98–111)
Creatinine: 1.42 mg/dL — ABNORMAL HIGH (ref 0.61–1.24)
GFR, Estimated: 52 mL/min — ABNORMAL LOW (ref >60)
Glucose, Bld: 234 mg/dL — ABNORMAL HIGH (ref 70–99)
Potassium: 4.7 mmol/L (ref 3.5–5.1)
Sodium: 136 mmol/L (ref 135–145)
Total Bilirubin: 0.4 mg/dL (ref 0.0–1.2)
Total Protein: 7.3 g/dL (ref 6.5–8.1)

## 2024-08-28 LAB — TSH: TSH: 1.09 u[IU]/mL (ref 0.350–4.500)

## 2024-08-28 MED ORDER — NIVOLUMAB-HYALURONIDASE-NVHY 600-10000 MG-UT/5ML ~~LOC~~ SOLN
1200.0000 mg | Freq: Once | SUBCUTANEOUS | Status: AC
  Administered 2024-08-28: 1200 mg via SUBCUTANEOUS
  Filled 2024-08-28: qty 10

## 2024-08-28 NOTE — Progress Notes (Signed)
 [facility]   [phone] [phone]   Clinic Follow up Note   Patient Care Team: Plotnikov, Karlynn GAILS, MD as PCP - General (Internal Medicine) Nahser, Aleene PARAS, MD (Inactive) as PCP - Cardiology (Cardiology) Liane Sharyne MATSU, [facility] (Inactive) as Pharmacist (Pharmacist) Trixie File, MD as Consulting Physician (Internal Medicine) Nieves Cough, MD as Consulting Physician (Urology) Avram Lupita BRAVO, MD as Consulting Physician (Gastroenterology) Patrcia Sharper, MD as Consulting Physician (Ophthalmology) Lanny Callander, MD as Attending Physician (Hematology and Oncology) Prentiss Heddy HERO, RN as [facility], [place], [place] as [facility] Care Management  Date of Service:  08/28/2024  CHIEF COMPLAINT: f/u of bladder cancer  CURRENT THERAPY:  Adjuvant nivolumab  injection every 4 weeks  Oncology History   Bladder cancer [facility]) Bladder cancer (HCC) -cT2N0M0 stage II diagnosed in 04/2023 -He has had 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 -He is clinically doing well, asymptomatic.  Surgical resection was discussed with patient, but he would like to try nonsurgical options. -He started Keytruda  on 01/12/23, stopped in June 2024 due to disease progression. 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 -I recommended neoadjuvant chemotherapy with cisplatin  and gemcitabine  for 4 cycles, followed by total  cystectomy and prostatectomy. -He started chemotherapy on June 08, 2023, and completed on 08/17/2023, overall tolerated well  -He underwent right robotic right nephroureterectomy, robotic radical cystectomy with total urethrectomy, bilateral pelvic lymph node dissection, and left cutaneous ureterostomy by Dr. Alvaro on 12/08/2023.. surgical path showed multifocal high-grade urothelial carcinoma, right renal pelvis, ureter, bladder, and prostatic urethra, surgical margines negative, 1 positive node  -He started adjuvant Nivo on 02/15/2024 -CT 03/27/2024 showed no radiographic evidence of malignancy   Assessment & Plan Bladder cancer on active immunotherapy Bladder cancer is being managed with nivolumab . No adverse reactions to the immunotherapy, except for some dry skin, which is being managed with lotion. No skin rash reported. Recent stent change three weeks ago with x-ray confirmation of placement. Last CT scan in June. Coordination with urologist Dr. Alvaro for ongoing management and potential future CT scan in December. - Continue nivolumab  immunotherapy - Coordinated with urologist Dr. Alvaro for ongoing management and potential future CT scan in December  Chronic kidney disease Recent slight elevation in creatinine levels, similar to previous levels two months ago. Possible dehydration indicated by dark urine. Adequate hydration is encouraged. No immediate concerns regarding kidney function. - Encouraged increased water  intake - Will repeat lab tests in one month to monitor kidney function  Osteomyelitis - Recently finished a course of antibiotics.  Follow-up with ID  Arthritis and hip pain - He wants to have hip surgery  Plan - He is clinically stable, back pain improved, still use walker due to the hip pain. - Lab reviewed, will proceed to nivolumab  injection today and continue every 4 weeks - Repeat CT scan in the next few months at his urologist office - Follow-up in 4 weeks for next  dose nivolumab    SUMMARY OF ONCOLOGIC HISTORY: Oncology History Overview  Note   Cancer Staging  Bladder cancer [facility]) Staging form: Urinary Bladder, AJCC 8th Edition - Clinical stage from 05/14/2021: Stage 0a (cTa, cN0, cM0) - Signed by Lanny Callander, MD on 01/02/2023 Stage prefix: Initial diagnosis WHO/ISUP grade (low/high): High Grade Histologic grading system: 2 grade system - Pathologic stage from 02/22/2022: Stage I (pT1, pN0, cM0) - Signed by Lanny Callander, MD on 01/02/2023 WHO/ISUP grade (low/high): High Grade Histologic grading system: 2 grade system     Bladder cancer (HCC)  05/14/2021 Cancer Staging   Staging form: Urinary Bladder, AJCC 8th Edition - Clinical stage from 05/14/2021: Stage 0a (cTa, cN0, cM0) - Signed by Lanny Callander, MD on 01/02/2023 Stage prefix: Initial diagnosis WHO/ISUP grade (low/high): High Grade Histologic grading system: 2 grade system   01/31/2022 Initial Diagnosis   Bladder cancer (HCC)   02/22/2022 Cancer Staging   Staging form: Urinary Bladder, AJCC 8th Edition - Pathologic stage from 02/22/2022: Stage I (pT1, pN0, cM0) - Signed by Lanny Callander, MD on 01/02/2023 WHO/ISUP grade (low/high): High Grade Histologic grading system: 2 grade system   01/12/2023 - 04/06/2023 Chemotherapy   Patient is on Treatment Plan : BLADDER Pembrolizumab  (200) q21d     05/05/2023 Pathology Results     FINAL MICROSCOPIC DIAGNOSIS:   A. BLADDER, RIGHT POSTERIOR, TURBT:  Noninvasive high grade papillary urothelial carcinoma  Muscularis propria (detrusor muscle) is not present   B. BLADDER NECK AND PROSTATIC URETHRA, BIOPSY:  High grade urothelial carcinoma  The carcinoma focally invades muscularis propria (detrusor muscle)     05/05/2023 Cancer Staging   Staging form: Urinary Bladder, AJCC 8th Edition - Clinical stage from 05/05/2023: Stage II (cT2, cN0, cM0) - Signed by Lanny Callander, MD on 06/15/2023 WHO/ISUP grade (low/high): High Grade Histologic grading system: 2 grade system    06/01/2023 Imaging   CT Chest, Abdomen, and Pelvis with contrast  IMPRESSION: 1. Similar irregular asymmetric wall thickening along the posterior urinary bladder with progressive irregular thickening of the right ureter extending to the level of the renal pelvis where there is now a masslike 16 x 12 mm soft tissue focus, suspicious for direct ureteral extension of disease into the renal pelvis. 2. Irregular asymmetric wall thickening along the posterior urinary bladder measuring 14 mm is similar prior. 3. Right double-J ureteral stent is in place with persistent perinephric/periureteric stranding without significant hydronephrosis. 4. No convincing evidence of metastatic disease in the chest, abdomen or pelvis 5. Pancolonic diverticulosis without findings of acute diverticulitis. 6. Aortic Atherosclerosis (ICD10-I70.0) and Emphysema (ICD10-J43.9).   06/09/2023 - 08/17/2023 Chemotherapy   Patient is on Treatment Plan : BLADDER Cisplatin  D1 + Gemcitabine  D1,8 q21d x 4 Cycles     02/15/2024 -  Chemotherapy   Patient is on Treatment Plan : BLADDER Nivolumab  (480) q28d        Discussed the use of AI scribe software for clinical note transcription with the patient, who gave verbal consent to proceed.  History of Present Illness Robert [name] is a 73 year old male with bladder cancer who presents for follow-up.  He is receiving nivolumab  injections for bladder cancer without issues, though he experiences dry skin without rash. He applies lotion occasionally and denies itching. A stent change occurred three weeks ago, confirmed by an x-ray. He sees his urologist approximately every four months for stent changes.  He drinks three to four bottles of water  a day and notes dark urine this morning, suggesting possible dehydration. His blood sugar was elevated at 234,  which he attributes to having eaten breakfast an hour before the test.     All other systems were reviewed with the  patient and are negative.  MEDICAL HISTORY:  Past Medical History:  Diagnosis Date   Anticoagulant long-term use    eliquis --- managed by cardiology   Aortic atherosclerosis 05/30/2024   Arthritis    Bladder cancer [facility])    urologist--- dr nieves;  overlapping   Cataract    History of basal cell carcinoma (BCC) excision    History of COVID-19 05/2019   per pt mild symptoms that resolved   HLD (hyperlipidemia)    HTN (hypertension)    Hx of colonic polyps    Insulin  dependent type 2 diabetes mellitus [facility])    endocrinologist--- dr trixie --  (03-11-2022 per pt check blood sugar 1-2 times daily,  fasting sugar-- 118-120s)   Nocturia    Paroxysmal atrial fibrillation (HCC) 03/12/2019   cardiologist--- dr calhoun   Renal disorder    Ureterocele    Use of cane as ambulatory aid     SURGICAL HISTORY: Past Surgical History:  Procedure Laterality Date   BLEPHAROPLASTY Bilateral    per pt approx  2001;   upper eyelid's   COLONOSCOPY     last one approx 2023   CYSTOSCOPY W/ RETROGRADES N/A 12/08/2023   Procedure: CYSTOSCOPY;  Surgeon: Alvaro Ricardo KATHEE Mickey., MD;  Location: [facility];  Service: Urology;  Laterality: N/A;  390 MINUTES NEEDED FOR CASE   CYSTOSCOPY WITH URETEROSCOPY AND STENT PLACEMENT Right 12/02/2022   Procedure: CYSTOSCOPY WITH RIGHT URETEROSCOPY, RIGHT DIAGNOSTIC URETEROSCOPY;  Surgeon: Nieves Cough, MD;  Location: [facility];  Service: Urology;  Laterality: Right;  90 MINS FOR CASE   CYSTOSCOPY/RETROGRADE/URETEROSCOPY Bilateral 05/05/2023   Procedure: CYSTOSCOPY BILATERAL RETROGRADE PYELOGRAM RIGHT URETEROSCOPY RIGHT STENT PLACEMENT;  Surgeon: Nieves Cough, MD;  Location: [facility];  Service: Urology;  Laterality: Bilateral;  90 MINS FOR CASE   IR IMAGING GUIDED PORT INSERTION  05/30/2023   IR LUMBAR DISC ASPIRATION W/IMG GUIDE  05/12/2024   IR REMOVAL TUN ACCESS W/ PORT W/O FL MOD SED  03/29/2024   PATELLAR TENDON REPAIR Left 04/16/2004   @[facility]     ROBOT ASSITED LAPAROSCOPIC NEPHROURETERECTOMY Right 12/08/2023   Procedure: RIGHT ROBOT ASSISTED Nephroureterectomy;  Surgeon: Alvaro Ricardo KATHEE Mickey., MD;  Location: [facility];  Service: Urology;  Laterality: Right;   ROBOT LAP RADICAL CYSTOPROSTATECTOMY PELVIC LYMPHADENECTOMY, NEOBLADDER  12/08/2023   Procedure: ROBOT ASSISTED LAPAROSCOPIC RADICAL CYSTOPROSTATECTOMY;  Surgeon: Alvaro Ricardo KATHEE Mickey., MD;  Location: [facility];  Service: Urology;;   TOTAL URETHRECTOMY,RADICAL N/A 12/08/2023   Procedure: TOTAL URETHRECTOMY,RADICAL;  Surgeon: Alvaro Ricardo KATHEE Mickey., MD;  Location: [facility];  Service: Urology;  Laterality: N/A;   TRANSESOPHAGEAL ECHOCARDIOGRAM (CATH LAB) N/A 04/02/2024   Procedure: TRANSESOPHAGEAL ECHOCARDIOGRAM;  Surgeon: Delford Maude BROCKS, MD;  Location: [facility] INVASIVE CV LAB;  Service: Cardiovascular;  Laterality: N/A;   TRANSURETHRAL RESECTION OF BLADDER TUMOR N/A 06/18/2021   Procedure: TRANSURETHRAL RESECTION OF BLADDER TUMOR (TURBT) WITH RIGHT URETERAL STENT PLACEMENT, RIGHT URETEROSCOPY WITH DISTRACTION OF TUMOR, FULGURATION;  Surgeon: Nieves Cough, MD;  Location: [facility]  Service: Urology;  Laterality: N/A;   TRANSURETHRAL RESECTION OF BLADDER TUMOR N/A 03/15/2022   Procedure: TRANSURETHRAL RESECTION OF BLADDER TUMOR (TURBT) WITH BILATERAL URETEROSCOPY/URETHRAL/ DILATION/BILATERAL RETROGRADE PYELOGRAM/BIOPSY AND FULGURATION OF RIGHT URETERAL CANCER/RIGHT STENT PLACEMENT;  Surgeon: Nieves Cough, MD;  Location: [facility];  Service: Urology;  Laterality: N/A;   TRANSURETHRAL  RESECTION OF BLADDER TUMOR N/A 05/05/2023   Procedure: TRANSURETHRAL RESECTION OF BLADDER TUMOR (TURBT) BLADDER BIOSPY;  Surgeon: Nieves Cough, MD;  Location: [facility];  Service: Urology;  Laterality: N/A;   TRANSURETHRAL RESECTION OF BLADDER TUMOR WITH MITOMYCIN -C N/A 12/02/2022   Procedure: TRANSURETHRAL RESECTION OF BLADDER TUMOR;  Surgeon: Nieves Cough,  MD;  Location: [facility];  Service: Urology;  Laterality: N/A;   TRANSURETHRAL RESECTION OF PROSTATE  05/05/2023   Procedure: TRANSURETHRAL RESECTION OF THE PROSTATE (TURP);  Surgeon: Nieves Cough, MD;  Location: [facility];  Service: Urology;;    I have reviewed the social history and family history with the patient and they are unchanged from previous note.  ALLERGIES:  is allergic to lisinopril .  MEDICATIONS:  Current Outpatient Medications  Medication Sig Dispense Refill   apixaban  (ELIQUIS ) 5 MG TABS tablet Take 1 tablet (5 mg total) by mouth 2 (two) times daily. 180 tablet 1   atorvastatin  (LIPITOR) 40 MG tablet Take 1 tablet (40 mg total) by mouth at bedtime.     B Complex-C (B-COMPLEX WITH VITAMIN C) tablet Take 1 tablet by mouth 3 (three) times a week.     Cholecalciferol  (VITAMIN D -3 PO) Take 1 tablet by mouth 3 (three) times a week.     lidocaine  (LIDODERM ) 5 % Place 1 patch onto the skin daily. Remove & Discard patch within 12 hours or as directed by MD 30 patch 0   methocarbamol  (ROBAXIN ) 500 MG tablet Take 1 tablet (500 mg total) by mouth every 8 (eight) hours as needed for muscle spasms. 60 tablet 0   metoprolol  succinate (TOPROL -XL) 25 MG 24 hr tablet Take 1 tablet (25 mg total) by mouth daily. 90 tablet 2   polyethylene glycol (MIRALAX  / GLYCOLAX ) 17 g packet Take 17 g by mouth daily.     propranolol  (INDERAL ) 10 MG tablet TAKE 1 TABLET BY MOUTH 4 TIMES DAILY AS NEEDED FOR PALPITATIONS, IRREGULAR OR FAST HEART RATE 270 tablet 1   Semaglutide , 1 MG/DOSE, (OZEMPIC , 1 MG/DOSE,) 4 MG/3ML SOPN INJECT 1 MG UNDER THE SKIN ONCE A WEEK AS DIRECTED 9 mL 0   senna-docusate (SENOKOT-S) 8.6-50 MG tablet Take 1-2 tablets by mouth 2 (two) times daily between meals as needed for mild constipation or moderate constipation.     TYLENOL  500 MG tablet Take 1,000 mg by mouth daily as needed for mild pain (pain score 1-3), headache or moderate pain (pain score 4-6).      amoxicillin  (AMOXIL ) 500 MG capsule Take 2 capsules (1,000 mg total) by mouth 3 (three) times daily. 180 capsule 1   EPINEPHrine  0.3 mg/0.3 mL IJ SOAJ injection Inject 0.3 mg into the muscle as needed for anaphylaxis. 1 each 0   furosemide  (LASIX ) 20 MG tablet TAKE 1 TO 2 TABLETS BY MOUTH DAILY AS NEEDED (Patient not taking: Reported on 07/10/2024) 180 tablet 1   gabapentin  (NEURONTIN ) 300 MG capsule Take 300 mg by mouth 3 (three) times daily.     oxyCODONE  (OXY IR/ROXICODONE ) 5 MG immediate release tablet Take 1 tablet (5 mg total) by mouth every 6 (six) hours as needed for moderate pain (pain score 4-6). 15 tablet 0   oxyCODONE  (OXYCONTIN ) 15 mg 12 hr tablet Take 1 tablet (15 mg total) by mouth every 12 (twelve) hours. 10 tablet 0   No current facility-administered medications for this visit.   Facility-Administered Medications Ordered in Other Visits  Medication Dose Route Frequency Provider Last Rate Last Admin  sodium chloride  flush (NS) 0.9 % injection 10 mL  10 mL Intracatheter PRN Lanny Callander, MD   10 mL at 02/15/24 1353    PHYSICAL EXAMINATION: ECOG PERFORMANCE STATUS: 2 - Symptomatic, <50% confined to bed  Vitals:   08/28/24 1335  BP: 132/74  Pulse: 83  Resp: 17  Temp: 98.3 F (36.8 C)  SpO2: 99%   Wt Readings from Last 3 Encounters:  08/28/24 185 lb 11.2 oz (84.2 kg)  08/08/24 186 lb (84.4 kg)  07/31/24 184 lb 6.4 oz (83.6 kg)     GENERAL:alert, no distress and comfortable SKIN: skin color, texture, turgor are normal, no rashes or significant lesions EYES: normal, Conjunctiva are pink and non-injected, sclera clear NECK: supple, thyroid  normal size, non-tender, without nodularity LYMPH:  no palpable lymphadenopathy in the cervical, axillary  LUNGS: clear to auscultation and percussion with normal breathing effort HEART: regular rate & rhythm and no murmurs and no lower extremity edema ABDOMEN:abdomen soft, non-tender and normal bowel sounds Musculoskeletal:no cyanosis  of digits and no clubbing  NEURO: alert & oriented x 3 with fluent speech, no focal motor/sensory deficits  Physical Exam    LABORATORY DATA:  I have reviewed the data as listed    Latest Ref Rng & Units 08/28/2024    1:09 PM 07/31/2024    1:16 PM 07/03/2024    2:00 PM  CBC  WBC 4.0 - 10.5 K/uL 7.2  8.2  9.4   Hemoglobin 13.0 - 17.0 g/dL 85.4  85.9  88.8   Hematocrit 39.0 - 52.0 % 42.7  41.1  34.6   Platelets 150 - 400 K/uL 174  175  215         Latest Ref Rng & Units 08/28/2024    1:09 PM 07/31/2024    1:16 PM 07/03/2024    2:00 PM  CMP  Glucose 70 - 99 mg/dL 765  820  816   BUN 8 - 23 mg/dL 24  25  15    Creatinine 0.61 - 1.24 mg/dL 8.57  8.82  8.84   Sodium 135 - 145 mmol/L 136  137  138   Potassium 3.5 - 5.1 mmol/L 4.7  3.9  4.2   Chloride 98 - 111 mmol/L 103  104  104   CO2 22 - 32 mmol/L 27  25  27    Calcium  8.9 - 10.3 mg/dL 9.5  89.4  9.4   Total Protein 6.5 - 8.1 g/dL 7.3  7.9  7.4   Total Bilirubin 0.0 - 1.2 mg/dL 0.4  0.3  0.3   Alkaline Phos 38 - 126 U/L 88  93  90   AST 15 - 41 U/L 18  14  12    ALT 0 - 44 U/L 15  18  12        RADIOGRAPHIC STUDIES: I have personally reviewed the radiological images as listed and agreed with the findings in the report. No results found.    Orders Placed This Encounter  Procedures   CBC with Differential (Cancer Center Only)    Standing Status:   Future    Expected Date:   11/20/2024    Expiration Date:   11/20/2025   CMP (Cancer Center only)    Standing Status:   Future    Expected Date:   11/20/2024    Expiration Date:   11/20/2025   T4    Standing Status:   Future    Expected Date:   11/20/2024    Expiration Date:  11/20/2025   TSH    Standing Status:   Future    Expected Date:   11/20/2024    Expiration Date:   11/20/2025   CBC with Differential (Cancer Center Only)    Standing Status:   Future    Expected Date:   12/18/2024    Expiration Date:   12/18/2025   CMP (Cancer Center only)    Standing Status:   Future     Expected Date:   12/18/2024    Expiration Date:   12/18/2025   T4    Standing Status:   Future    Expected Date:   12/18/2024    Expiration Date:   12/18/2025   TSH    Standing Status:   Future    Expected Date:   12/18/2024    Expiration Date:   12/18/2025   All questions were answered. The patient knows to call the clinic with any problems, questions or concerns. No barriers to learning was detected. The total time spent in the appointment was 25 minutes, including review of chart and various tests results, discussions about plan of care and coordination of care plan     Onita Mattock, MD 08/28/2024

## 2024-08-28 NOTE — Assessment & Plan Note (Signed)
 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 -He is clinically doing well, asymptomatic.  Surgical resection was discussed with patient, but he would like to try nonsurgical options. -He started Keytruda  on 01/12/23, stopped in June 2024 due to disease progression. 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 -I recommended neoadjuvant chemotherapy with cisplatin  and gemcitabine  for 4 cycles, followed by total cystectomy and prostatectomy. -He started chemotherapy on June 08, 2023, and completed on 08/17/2023, overall tolerated well  -He underwent right robotic right nephroureterectomy, robotic radical cystectomy with total urethrectomy, bilateral pelvic lymph node dissection, and left cutaneous ureterostomy by Dr. Alvaro on 12/08/2023.. surgical path showed multifocal high-grade urothelial carcinoma, right renal pelvis, ureter, bladder, and prostatic urethra, surgical margines negative, 1 positive node  -He started adjuvant Nivo on 02/15/2024 -CT 03/27/2024 showed no radiographic evidence of malignancy

## 2024-08-28 NOTE — Patient Instructions (Signed)
 [facility] - A DEPT OF [facility]  Discharge Instructions: Thank you for choosing [facility] to provide your oncology and hematology care.   If you have a lab appointment with the Cancer Center, please go directly to the Cancer Center and check in at the registration area.   Wear comfortable clothing and clothing appropriate for easy access to any Portacath or PICC line.   We strive to give you quality time with your provider. You may need to reschedule your appointment if you arrive late (15 or more minutes).  Arriving late affects you and other patients whose appointments are after yours.  Also, if you miss three or more appointments without notifying the office, you may be dismissed from the clinic at the provider's discretion.      For prescription refill requests, have your pharmacy contact our office and allow 72 hours for refills to be completed.    Today you received the following chemotherapy and/or immunotherapy agents: Nivolumab  (opdivo  qvantig)      To help prevent nausea and vomiting after your treatment, we encourage you to take your nausea medication as directed.  BELOW ARE SYMPTOMS THAT SHOULD BE REPORTED IMMEDIATELY: *FEVER GREATER THAN 100.4 F (38 C) OR HIGHER *CHILLS OR SWEATING *NAUSEA AND VOMITING THAT IS NOT CONTROLLED WITH YOUR NAUSEA MEDICATION *UNUSUAL SHORTNESS OF BREATH *UNUSUAL BRUISING OR BLEEDING *URINARY PROBLEMS (pain or burning when urinating, or frequent urination) *BOWEL PROBLEMS (unusual diarrhea, constipation, pain near the anus) TENDERNESS IN MOUTH AND THROAT WITH OR WITHOUT PRESENCE OF ULCERS (sore throat, sores in mouth, or a toothache) UNUSUAL RASH, SWELLING OR PAIN  UNUSUAL VAGINAL DISCHARGE OR ITCHING   Items with * indicate a potential emergency and should be followed up as soon as possible or go to the Emergency Department if any problems should occur.  Please show the CHEMOTHERAPY ALERT CARD  or IMMUNOTHERAPY ALERT CARD at check-in to the Emergency Department and triage nurse.  Should you have questions after your visit or need to cancel or reschedule your appointment, please contact [facility] - A DEPT OF [facility]  Dept: [phone]  and follow the prompts.  Office hours are 8:00 a.m. to 4:30 p.m. Monday - Friday. Please note that voicemails left after 4:00 p.m. may not be returned until the following business day.  We are closed weekends and major holidays. You have access to a nurse at all times for urgent questions. Please call the main number to the clinic Dept: [phone] and follow the prompts.   For any non-urgent questions, you may also contact your provider using MyChart. We now offer e-Visits for anyone 81 and older to request care online for non-urgent symptoms. For details visit [email or web address].   Also download the MyChart app! Go to the app store, search MyChart, open the app, select [place], and log in with your MyChart username and password.

## 2024-08-29 LAB — T4: T4, Total: 8.6 ug/dL (ref 4.5–12.0)

## 2024-09-01 ENCOUNTER — Other Ambulatory Visit: Payer: Self-pay | Admitting: Infectious Diseases

## 2024-09-02 NOTE — Telephone Encounter (Signed)
 Okay to refill? Has an appointment with Dr.Singh on 12/2

## 2024-09-02 NOTE — Telephone Encounter (Signed)
 yes

## 2024-09-04 DIAGNOSIS — Z936 Other artificial openings of urinary tract status: Secondary | ICD-10-CM | POA: Diagnosis not present

## 2024-09-04 DIAGNOSIS — D485 Neoplasm of uncertain behavior of skin: Secondary | ICD-10-CM | POA: Diagnosis not present

## 2024-09-04 DIAGNOSIS — Z8551 Personal history of malignant neoplasm of bladder: Secondary | ICD-10-CM | POA: Diagnosis not present

## 2024-09-05 ENCOUNTER — Other Ambulatory Visit: Payer: Self-pay

## 2024-09-05 NOTE — Patient Outreach (Signed)
 Complex Care Management   Visit Note  09/05/2024  Name:  Robert [name] MRN: [id number] DOB: August 12, 1951  Situation: Referral received for Complex Care Management related to bladder ca(left ureterostomy (patient denies any concerns at this time., discitis/myelomililtis back I obtained verbal consent from Patient.  Visit completed with Patient  on the phone  Background:   Past Medical History:  Diagnosis Date   Anticoagulant long-term use    eliquis --- managed by cardiology   Aortic atherosclerosis 05/30/2024   Arthritis    Bladder cancer [facility])    urologist--- dr nieves;  overlapping   Cataract    History of basal cell carcinoma (BCC) excision    History of COVID-19 05/2019   per pt mild symptoms that resolved   HLD (hyperlipidemia)    HTN (hypertension)    Hx of colonic polyps    Insulin  dependent type 2 diabetes mellitus [facility])    endocrinologist--- dr trixie --  (03-11-2022 per pt check blood sugar 1-2 times daily,  fasting sugar-- 118-120s)   Nocturia    Paroxysmal atrial fibrillation (HCC) 03/12/2019   cardiologist--- dr calhoun   Renal disorder    Ureterocele    Use of cane as ambulatory aid     Assessment: Request clarification re: antibiotic therapy: Per review of chart antibiotics stopped on 08/16/24 by Dr. Dennise. However, refill placed for amoxicillin  on 09/01/24 by Corean Fireman, NP. Patient denies any worsening back issues. Patient has an upcoming appointment with orthopedic provider re: possible right hip replacement procedure. Patient Reported Symptoms:  Cognitive Cognitive Status: No symptoms reported      Neurological Neurological Review of Symptoms: No symptoms reported    HEENT HEENT Symptoms Reported: No symptoms reported      Cardiovascular Cardiovascular Symptoms Reported: No symptoms reported    Respiratory Respiratory Symptoms Reported: No symptoms reported    Endocrine Endocrine Symptoms Reported: No symptoms reported Is patient  diabetic?: Yes (last checked  138 fasting couple days ago. checks BS 3-4 times/week.)    Gastrointestinal Gastrointestinal Symptoms Reported: No symptoms reported      Genitourinary Genitourinary Symptoms Reported: No symptoms reported Additional Genitourinary Details: Left uretorostomy-patient denies any concerns: no color, no sediment, no oder. per patient last seen three weeks ago Genitourinary Management Strategies: Urostomy Urostomy Stoma Size: per patient unsure of the size Urostomy Stoma Care: per patient changes every 2 days. Urostomy Supplies Obtained From: [employer] medical supplies [employer] Needed: no  Integumentary Integumentary Symptoms Reported: No symptoms reported    Musculoskeletal Musculoskelatal Symptoms Reviewed: Difficulty walking Additional Musculoskeletal Details: patient reports back pain 2/10 is a lot better. right hip arthritis, seeing orthopedic provider for possible hip replacement in 12/ Musculoskeletal Management Strategies: Adequate rest, Medication therapy, Medical device Musculoskeletal Self-Management Outcome: 4 (good) Falls in the past year?: No    Psychosocial Psychosocial Symptoms Reported: No symptoms reported         There were no vitals filed for this visit.    Medications Reviewed Today     Reviewed by Belem Hintze M, RN (Registered Nurse) on 09/05/24 at 1326  Med List Status: <None>   Medication Order Taking? Sig Documenting Provider Last Dose Status Informant  amoxicillin  (AMOXIL ) 500 MG capsule [id number]  TAKE 2 CAPSULES (1,000 MG TOTAL) BY MOUTH 3 (THREE) TIMES DAILY.  Patient not taking: Reported on 09/05/2024   Dennise Kingsley, MD  Active   apixaban  (ELIQUIS ) 5 MG TABS tablet [id number] Yes Take 1 tablet (5 mg total) by mouth 2 (two) times  daily. Nahser, Aleene PARAS, MD  Active Self, Pharmacy Records  atorvastatin  (LIPITOR) 40 MG tablet [id number] Yes Take 1 tablet (40 mg total) by mouth at bedtime. Cheryle Page, MD  Active  Self, Pharmacy Records           Med Note (CRUTHIS, CHLOE C   Thu May 30, 2024 12:27 PM) No fill hx found. Pt is adamant he is taking this.   B Complex-C (B-COMPLEX WITH VITAMIN C) tablet [id number] Yes Take 1 tablet by mouth 3 (three) times a week. [provider]  Active Self, Pharmacy Records           Med Note [facility], SUSAN A   Sun Feb 04, 2024  4:51 PM)    Cholecalciferol  (VITAMIN D -3 PO) [id number] Yes Take 1 tablet by mouth 3 (three) times a week. [provider]  Active Self, Pharmacy Records           Med Note (CRUTHIS, CHLOE C   Thu May 30, 2024 10:05 AM)    EPINEPHrine  0.3 mg/0.3 mL IJ SOAJ injection [id number]  Inject 0.3 mg into the muscle as needed for anaphylaxis. Victor Lynwood DASEN, PA-C  Active Self, Pharmacy Records  furosemide  (LASIX ) 20 MG tablet [id number]  TAKE 1 TO 2 TABLETS BY MOUTH DAILY AS NEEDED  Patient not taking: Reported on 07/10/2024   Plotnikov, Aleksei V, MD  Active   gabapentin  (NEURONTIN ) 300 MG capsule [id number]  Take 300 mg by mouth 3 (three) times daily. [provider]  Active Self, Pharmacy Records  lidocaine  (LIDODERM ) 5 % [id number] Yes Place 1 patch onto the skin daily. Remove & Discard patch within 12 hours or as directed by MD Arlon Carliss ORN, DO  Active Self, Pharmacy Records           Med Note (CRUTHIS, CHLOE C   Thu May 30, 2024 10:04 AM)    methocarbamol  (ROBAXIN ) 500 MG tablet [id number] Yes Take 1 tablet (500 mg total) by mouth every 8 (eight) hours as needed for muscle spasms. Arlon Carliss ORN, DO  Active Self, Pharmacy Records  metoprolol  succinate (TOPROL -XL) 25 MG 24 hr tablet [id number] Yes Take 1 tablet (25 mg total) by mouth daily. Nahser, Aleene PARAS, MD  Active Self, Pharmacy Records  oxyCODONE  (OXY IR/ROXICODONE ) 5 MG immediate release tablet [id number]  Take 1 tablet (5 mg total) by mouth every 6 (six) hours as needed for moderate pain (pain score 4-6). Jillian Buttery, MD  Active   oxyCODONE  (OXYCONTIN ) 15 mg  12 hr tablet [id number]  Take 1 tablet (15 mg total) by mouth every 12 (twelve) hours. Jillian Buttery, MD  Active   polyethylene glycol (MIRALAX  / GLYCOLAX ) 17 g packet [id number] Yes Take 17 g by mouth daily. Jillian Buttery, MD  Active   propranolol  (INDERAL ) 10 MG tablet [id number] Yes TAKE 1 TABLET BY MOUTH 4 TIMES DAILY AS NEEDED FOR PALPITATIONS, IRREGULAR OR FAST HEART RATE Nahser, Aleene PARAS, MD  Active Self, Pharmacy Records           Med Note [facility], NATHANEL N   Fri May 10, 2024  5:08 PM)    Semaglutide , 1 MG/DOSE, (OZEMPIC , 1 MG/DOSE,) 4 MG/3ML SOPN [id number] Yes INJECT 1 MG UNDER THE SKIN ONCE A WEEK AS DIRECTED Trixie File, MD  Active   senna-docusate (SENOKOT-S) 8.6-50 MG tablet [id number] Yes Take 1-2 tablets by mouth 2 (two) times daily between meals as needed for mild constipation or moderate constipation. Gonfa, Taye T, MD  Active Self,  Pharmacy Records  sodium chloride  flush (NS) 0.9 % injection 10 mL [id number]   Lanny Callander, MD  Active   TYLENOL  500 MG tablet [id number] Yes Take 1,000 mg by mouth daily as needed for mild pain (pain score 1-3), headache or moderate pain (pain score 4-6). [provider]  Active Self, Pharmacy Records          Recommendation:   Continue Current Plan of Care Request antibiotic clarification from ID.  Follow Up Plan:   Telephone follow up appointment date/time:  10/21/24 at 11:30 am  Heddy Shutter, RN, MSN, BSN, CCM [place]  [facility], Population Health Case Manager Phone: [phone]

## 2024-09-05 NOTE — Patient Instructions (Signed)
 Visit Information  Thank you for taking time to visit with me today. Please don't hesitate to contact me if I can be of assistance to you before our next scheduled appointment.  Your next care management appointment is by telephone on 10/21/24 at 11:30 am  Please call the care guide team at [phone] if you need to cancel, schedule, or reschedule an appointment.   Please call the Suicide and Crisis Lifeline: 988 call the [place]  National Suicide Prevention Lifeline: 602-484-7579 or TTY: 302-341-2485 TTY 5163035192) to talk to a trained counselor if you are experiencing a Mental Health or Behavioral Health Crisis or need someone to talk to.   Heddy Shutter, RN, MSN, BSN, CCM [place]  [facility], Population Health Case Manager Phone: [phone]

## 2024-09-24 ENCOUNTER — Encounter: Payer: Self-pay | Admitting: Internal Medicine

## 2024-09-24 ENCOUNTER — Ambulatory Visit: Admitting: Internal Medicine

## 2024-09-24 ENCOUNTER — Other Ambulatory Visit: Payer: Self-pay

## 2024-09-24 VITALS — BP 125/83 | HR 88 | Temp 97.7°F | Wt 195.8 lb

## 2024-09-24 DIAGNOSIS — M4647 Discitis, unspecified, lumbosacral region: Secondary | ICD-10-CM | POA: Diagnosis not present

## 2024-09-24 NOTE — Progress Notes (Unsigned)
 Patient: Robert [name]  DOB: [birth year] MRN: [id number] PCP: Garald Karlynn GAILS, MD  Referring Provider: ***  No chief complaint on file.    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0-29.9) 01/03/2019   Type 2 diabetes mellitus without complication, without long-term current use of insulin  (HCC) 07/23/2018   BPH associated with nocturia 03/01/2017   Irregular heart rate 06/23/2011   Hx of colonic polyps    ERECTILE DYSFUNCTION 12/19/2008   SKIN CANCER, HX OF 12/19/2008   Hyperlipidemia 08/21/2007   Essential hypertension 08/21/2007     Subjective:  73 year old male with past medical history of bladder cancer status post chemotherapy, cystoscopy prostatectomy, right nephroureterectomy,total urethrectomy now on nivolumab  every 4 weeks, admission in April for Serratia marcessens UTI/bacteremia treated with Cipro  and amoxicillin  x 2 weeks EOT 4/27, recent admission 5 9-5 10 for AKI underwent stent placement presents for hospital follow-up of E faecalis bacteremia secondary to UTI.  Patient had presented  with worsening back pain x 1 day.  MRI lumbar spine showed no concern for infection.  Urine and blood cultures grew E faecalis.  Blood culture 2/2 grew E faecalis.  CT renal study showed stent in left ureter mild to moderate hydronephrosis.  Urine was obtained from ostomy.  TTE and TEE without vegetation.  Patient will discharge with amoxicillin  x 2 weeks from port removal.  EOT 6/20.  Present today for follow-up and surveillance blood cultures. Seen by urology reecc oupatint f/u.  reports tolerating abx. Awaiting MRI results done today Today 09/24/24: off of amox.  Review of Systems  All other systems reviewed and are negative.   Past Medical History:  Diagnosis Date   Anticoagulant long-term use    eliquis --- managed by cardiology   Aortic atherosclerosis 05/30/2024   Arthritis    Bladder cancer [facility])    urologist--- dr nieves;  overlapping   Cataract    History of basal cell carcinoma (BCC) excision    History of COVID-19 05/2019   per pt mild symptoms that resolved   HLD (hyperlipidemia)    HTN (hypertension)    Hx of colonic polyps    Insulin  dependent type 2 diabetes mellitus [facility])    endocrinologist--- dr trixie --  (03-11-2022 per pt check blood sugar 1-2 times daily,  fasting sugar-- 118-120s)   Nocturia    Paroxysmal atrial fibrillation (HCC) 03/12/2019   cardiologist--- dr calhoun   Renal disorder    Ureterocele    Use of cane as ambulatory aid     Outpatient Medications Prior to Visit  Medication Sig Dispense Refill   amoxicillin  (AMOXIL ) 500 MG capsule TAKE 2 CAPSULES (1,000 MG  TOTAL) BY MOUTH 3 (THREE) TIMES DAILY. (Patient not taking: Reported on 09/05/2024) 180 capsule 0   apixaban  (ELIQUIS ) 5 MG TABS tablet Take 1 tablet (5 mg total) by mouth 2 (two) times daily. 180 tablet 1   atorvastatin  (LIPITOR) 40 MG tablet Take 1 tablet (40 mg total) by mouth at bedtime.     B Complex-C (B-COMPLEX WITH VITAMIN C) tablet Take 1 tablet by mouth 3 (three) times a week.      Cholecalciferol  (VITAMIN D -3 PO) Take 1 tablet by mouth 3 (three) times a week.     EPINEPHrine  0.3 mg/0.3 mL IJ SOAJ injection Inject 0.3 mg into the muscle as needed for anaphylaxis. 1 each 0   furosemide  (LASIX ) 20 MG tablet TAKE 1 TO 2 TABLETS BY MOUTH DAILY AS NEEDED (Patient not taking: Reported on 07/10/2024) 180 tablet 1   gabapentin  (NEURONTIN ) 300 MG capsule Take 300 mg by mouth 3 (three) times daily.     lidocaine  (LIDODERM ) 5 % Place 1 patch onto the skin daily. Remove & Discard patch within 12 hours or as directed by MD 30 patch 0   methocarbamol  (ROBAXIN ) 500 MG tablet Take 1 tablet (500 mg total) by mouth every 8 (eight) hours as needed for muscle spasms. 60 tablet 0   metoprolol  succinate (TOPROL -XL) 25 MG 24 hr tablet Take 1 tablet (25 mg total) by mouth daily. 90 tablet 2   oxyCODONE  (OXY IR/ROXICODONE ) 5 MG immediate release tablet Take 1 tablet (5 mg total) by mouth every 6 (six) hours as needed for moderate pain (pain score 4-6). 15 tablet 0   oxyCODONE  (OXYCONTIN ) 15 mg 12 hr tablet Take 1 tablet (15 mg total) by mouth every 12 (twelve) hours. 10 tablet 0   polyethylene glycol (MIRALAX  / GLYCOLAX ) 17 g packet Take 17 g by mouth daily.     propranolol  (INDERAL ) 10 MG tablet TAKE 1 TABLET BY MOUTH 4 TIMES DAILY AS NEEDED FOR PALPITATIONS, IRREGULAR OR FAST HEART RATE 270 tablet 1   Semaglutide , 1 MG/DOSE, (OZEMPIC , 1 MG/DOSE,) 4 MG/3ML SOPN INJECT 1 MG UNDER THE SKIN ONCE A WEEK AS DIRECTED 9 mL 0   senna-docusate (SENOKOT-S) 8.6-50 MG tablet Take 1-2 tablets by mouth 2 (two) times daily between meals as needed for mild constipation or moderate constipation.     TYLENOL  500 MG tablet Take 1,000 mg by mouth daily as needed for mild pain (pain score 1-3), headache or moderate pain (pain score 4-6).     Facility-Administered Medications Prior to Visit  Medication Dose Route Frequency Provider Last Rate Last Admin   sodium chloride  flush (NS) 0.9 % injection 10 mL  10 mL Intracatheter  PRN Lanny Callander, MD   10 mL at 02/15/24 1353     Allergies  Allergen Reactions   Lisinopril  Swelling and Other (See Comments)    Tongue and throat became swollen    Social History   Tobacco Use   Smoking status: Some Days    Current packs/day: 0.25    Average packs/day: 0.3 packs/day for 13.9 years (3.7 ttl pk-yrs)    Types: Cigars, Cigarettes    Start date: 2012    Last attempt to quit: 02/22/2011   Smokeless tobacco: Never   Tobacco comments:    Smoking cessation instruction/counseling given:  counseled patient on the dangers of tobacco use, advised patient to stop smoking, and reviewed strategies to maximize success   Vaping Use   Vaping status: Never Used  Substance Use Topics   Alcohol   use: Yes    Alcohol /week: 3.0 standard drinks of alcohol     Types: 3 Shots of liquor per week    Comment: 6-8oz /wk OCC MARTINI 2 TO 3 X WEEK   Drug use: Never    Family History  Problem Relation Age of Onset   Pancreatic cancer Mother 36   Cancer Paternal Uncle        unknown type cancer   Aortic aneurysm Other        family history   Cancer Other        Breast (1st degree relative)   Colon cancer Neg Hx    Rectal cancer Neg Hx    Stomach cancer Neg Hx    Esophageal cancer Neg Hx     Objective:  There were no vitals filed for this visit. There is no height or weight on file to calculate BMI.  Physical Exam Constitutional:      General: He is not in acute distress.    Appearance: He is normal weight. He is not toxic-appearing.  HENT:     Head: Normocephalic and atraumatic.     Right Ear: External ear normal.     Left Ear: External ear normal.     Nose: No congestion or rhinorrhea.     Mouth/Throat:     Mouth: Mucous membranes are moist.     Pharynx: Oropharynx is clear.  Eyes:     Extraocular Movements: Extraocular movements intact.     Conjunctiva/sclera: Conjunctivae normal.     Pupils: Pupils are equal, round, and reactive to light.  Cardiovascular:     Rate and  Rhythm: Normal rate and regular rhythm.     Heart sounds: No murmur heard.    No friction rub. No gallop.  Pulmonary:     Effort: Pulmonary effort is normal.     Breath sounds: Normal breath sounds.  Abdominal:     General: Abdomen is flat. Bowel sounds are normal.     Palpations: Abdomen is soft.     Comments: stoma  Musculoskeletal:     Cervical back: Normal range of motion and neck supple.  Skin:    General: Skin is warm and dry.  Neurological:     General: No focal deficit present.     Mental Status: He is oriented to person, place, and time.  Psychiatric:        Mood and Affect: Mood normal.     Lab Results: Lab Results  Component Value Date   WBC 7.2 08/28/2024   HGB 14.5 08/28/2024   HCT 42.7 08/28/2024   MCV 81.3 08/28/2024   PLT 174 08/28/2024    Lab Results  Component Value Date   CREATININE 1.42 (H) 08/28/2024   BUN 24 (H) 08/28/2024   NA 136 08/28/2024   K 4.7 08/28/2024   CL 103 08/28/2024   CO2 27 08/28/2024    Lab Results  Component Value Date   ALT 15 08/28/2024   AST 18 08/28/2024   ALKPHOS 88 08/28/2024   BILITOT 0.4 08/28/2024     Assessment & Plan:  #Back pain #Bladder cancer, gets nivolumab  #vertepral infection -Pt initially admitted with e faecalis bacteremia treated with amox  x 2 weeks from from port removal through 6/20 -Then admitted and found to have vertebral infection(sp disc aspiration on 7/20). MRI showed L4-L5 discitis and osteomyelitis with L5-S1 discitis and epidural phlegmon and he underwent a disc aspiration on 05/12/2024 which was E faecalis had a PICC placement and started  on ampicillin  the plan to give it for at least 4 weeks until 06/10/2024. treated with amp x 8 weeks eot 9/15 given re-admission for  worsening back pain and progressive OM on mri .  - blood Cx in EPIC( one episode on e faecalis bacteremia in 2025 noted in June 2025, one episode of serratia marcescens noted in April 202).  -Seen on 9/17 with ID and initial  plan to continue tamox 1gm tid till stent out( new one placed in September) -Requiring walker, although back pain better. Continue amox in October as awaiting mri results which are improved.  Stopped amox about a month ago after mri results. -Notes back pain has significanly improved. No fevers or chills. He uses a walker, it has improved.  Plan: -reviewed cbc and cmp F/U prn if new concern for infection. Pt has stopped abx.   -f/u with ID prn [facility]   09/24/24  11:41 AM  I personally spent a total of ***. minutes in the care of the patient today including preparing to see the patient, getting/reviewing separately obtained history, performing a medically appropriate exam/evaluation, counseling and educating, documenting clinical information in the EHR, independently interpreting results, and communicating results.

## 2024-09-25 ENCOUNTER — Inpatient Hospital Stay: Attending: Hematology

## 2024-09-25 ENCOUNTER — Inpatient Hospital Stay

## 2024-09-25 ENCOUNTER — Encounter: Payer: Self-pay | Admitting: Hematology

## 2024-09-25 ENCOUNTER — Inpatient Hospital Stay: Admitting: Hematology

## 2024-09-25 VITALS — BP 125/71 | HR 71 | Temp 98.2°F | Resp 17 | Ht 75.0 in | Wt 188.3 lb

## 2024-09-25 DIAGNOSIS — N189 Chronic kidney disease, unspecified: Secondary | ICD-10-CM | POA: Insufficient documentation

## 2024-09-25 DIAGNOSIS — C675 Malignant neoplasm of bladder neck: Secondary | ICD-10-CM | POA: Insufficient documentation

## 2024-09-25 DIAGNOSIS — I700 Atherosclerosis of aorta: Secondary | ICD-10-CM | POA: Diagnosis not present

## 2024-09-25 DIAGNOSIS — C679 Malignant neoplasm of bladder, unspecified: Secondary | ICD-10-CM

## 2024-09-25 DIAGNOSIS — R319 Hematuria, unspecified: Secondary | ICD-10-CM | POA: Insufficient documentation

## 2024-09-25 DIAGNOSIS — Z79899 Other long term (current) drug therapy: Secondary | ICD-10-CM | POA: Insufficient documentation

## 2024-09-25 DIAGNOSIS — M549 Dorsalgia, unspecified: Secondary | ICD-10-CM | POA: Diagnosis not present

## 2024-09-25 DIAGNOSIS — J439 Emphysema, unspecified: Secondary | ICD-10-CM | POA: Insufficient documentation

## 2024-09-25 DIAGNOSIS — M1610 Unilateral primary osteoarthritis, unspecified hip: Secondary | ICD-10-CM | POA: Diagnosis not present

## 2024-09-25 DIAGNOSIS — Z5112 Encounter for antineoplastic immunotherapy: Secondary | ICD-10-CM | POA: Insufficient documentation

## 2024-09-25 LAB — CMP (CANCER CENTER ONLY)
ALT: 26 U/L (ref 0–44)
AST: 24 U/L (ref 15–41)
Albumin: 4.2 g/dL (ref 3.5–5.0)
Alkaline Phosphatase: 94 U/L (ref 38–126)
Anion gap: 10 (ref 5–15)
BUN: 22 mg/dL (ref 8–23)
CO2: 24 mmol/L (ref 22–32)
Calcium: 9.7 mg/dL (ref 8.9–10.3)
Chloride: 105 mmol/L (ref 98–111)
Creatinine: 1.19 mg/dL (ref 0.61–1.24)
GFR, Estimated: >60 mL/min (ref >60)
Glucose, Bld: 143 mg/dL — ABNORMAL HIGH (ref 70–99)
Potassium: 4 mmol/L (ref 3.5–5.1)
Sodium: 139 mmol/L (ref 135–145)
Total Bilirubin: 0.4 mg/dL (ref 0.0–1.2)
Total Protein: 7.3 g/dL (ref 6.5–8.1)

## 2024-09-25 LAB — CBC WITH DIFFERENTIAL (CANCER CENTER ONLY)
Abs Immature Granulocytes: 0.05 K/uL (ref 0.00–0.07)
Basophils Absolute: 0.1 K/uL (ref 0.0–0.1)
Basophils Relative: 1 %
Eosinophils Absolute: 0.1 K/uL (ref 0.0–0.5)
Eosinophils Relative: 1 %
HCT: 42.2 % (ref 39.0–52.0)
Hemoglobin: 14.4 g/dL (ref 13.0–17.0)
Immature Granulocytes: 1 %
Lymphocytes Relative: 28 %
Lymphs Abs: 2.4 K/uL (ref 0.7–4.0)
MCH: 27.8 pg (ref 26.0–34.0)
MCHC: 34.1 g/dL (ref 30.0–36.0)
MCV: 81.5 fL (ref 80.0–100.0)
Monocytes Absolute: 0.7 K/uL (ref 0.1–1.0)
Monocytes Relative: 9 %
Neutro Abs: 5.3 K/uL (ref 1.7–7.7)
Neutrophils Relative %: 60 %
Platelet Count: 159 K/uL (ref 150–400)
RBC: 5.18 MIL/uL (ref 4.22–5.81)
RDW: 16.3 % — ABNORMAL HIGH (ref 11.5–15.5)
WBC Count: 8.6 K/uL (ref 4.0–10.5)
nRBC: 0 % (ref 0.0–0.2)

## 2024-09-25 LAB — TSH: TSH: 1.23 u[IU]/mL (ref 0.350–4.500)

## 2024-09-25 MED ORDER — NIVOLUMAB-HYALURONIDASE-NVHY 600-10000 MG-UT/5ML ~~LOC~~ SOLN
1200.0000 mg | Freq: Once | SUBCUTANEOUS | Status: AC
  Administered 2024-09-25: 1200 mg via SUBCUTANEOUS
  Filled 2024-09-25: qty 10

## 2024-09-25 NOTE — Assessment & Plan Note (Signed)
 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 -He is clinically doing well, asymptomatic.  Surgical resection was discussed with patient, but he would like to try nonsurgical options. -He started Keytruda  on 01/12/23, stopped in June 2024 due to disease progression. 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 -I recommended neoadjuvant chemotherapy with cisplatin  and gemcitabine  for 4 cycles, followed by total cystectomy and prostatectomy. -He started chemotherapy on June 08, 2023, and completed on 08/17/2023, overall tolerated well  -He underwent right robotic right nephroureterectomy, robotic radical cystectomy with total urethrectomy, bilateral pelvic lymph node dissection, and left cutaneous ureterostomy by Dr. Alvaro on 12/08/2023.. surgical path showed multifocal high-grade urothelial carcinoma, right renal pelvis, ureter, bladder, and prostatic urethra, surgical margines negative, 1 positive node  -He started adjuvant Nivo on 02/15/2024, plan for one year  -CT 03/27/2024 showed no radiographic evidence of malignancy

## 2024-09-25 NOTE — Progress Notes (Signed)
 [facility]   [phone] [phone]   Clinic Follow up Note   Patient Care Team: Plotnikov, Karlynn GAILS, MD as PCP - General (Internal Medicine) Nahser, Aleene PARAS, MD (Inactive) as PCP - Cardiology (Cardiology) Liane Sharyne MATSU, [facility] (Inactive) as Pharmacist (Pharmacist) Trixie File, MD as Consulting Physician (Internal Medicine) Nieves Cough, MD as Consulting Physician (Urology) Avram Lupita BRAVO, MD as Consulting Physician (Gastroenterology) Patrcia Sharper, MD as Consulting Physician (Ophthalmology) Lanny Callander, MD as Attending Physician (Hematology and Oncology) Prentiss Heddy HERO, RN as [facility], [place], [place] as [facility] Care Management  Date of Service:  09/25/2024  CHIEF COMPLAINT: f/u of bladder cancer  CURRENT THERAPY:  Adjuvant nivolumab  injection every 4 weeks  Oncology History   Bladder cancer [facility]) Bladder cancer (HCC) -cT2N0M0 stage II diagnosed in 04/2023 -He has had 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 -He is clinically doing well, asymptomatic.  Surgical resection was discussed with patient, but he would like to try nonsurgical options. -He started Keytruda  on 01/12/23, stopped in June 2024 due to disease progression. 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 -I recommended neoadjuvant chemotherapy with cisplatin  and gemcitabine  for 4 cycles, followed by total  cystectomy and prostatectomy. -He started chemotherapy on June 08, 2023, and completed on 08/17/2023, overall tolerated well  -He underwent right robotic right nephroureterectomy, robotic radical cystectomy with total urethrectomy, bilateral pelvic lymph node dissection, and left cutaneous ureterostomy by Dr. Alvaro on 12/08/2023.. surgical path showed multifocal high-grade urothelial carcinoma, right renal pelvis, ureter, bladder, and prostatic urethra, surgical margines negative, 1 positive node  -He started adjuvant Nivo on 02/15/2024, plan for one year  -CT 03/27/2024 showed no radiographic evidence of malignancy   Assessment & Plan Bladder cancer under active treatment Bladder cancer is under active treatment with adjuvant therapy. Treatment is scheduled to complete by the end of March. No new symptoms or complications reported. - Proceeded with scheduled infusion today - Sent message to Dr. HERO regarding scan scheduling  Hematuria associated with ureteral stent and anticoagulation Intermittent hematuria associated with ureteral stent and anticoagulation therapy. Hematuria resolves when anticoagulation is stopped. No blood clots reported. Recent stent change noted, with consideration for further intervention if necessary. - Sent message to Dr. HERO regarding potential stent intervention  Chronic back pain, improved Chronic back pain has improved significantly, with 95% reduction in pain. Previous bacterial infection treated with antibiotics, with resolution confirmed by infectious disease specialist. No current issues with Zometa injection, though mild fatigue noted post-injection.  Chronic hip pain secondary to osteoarthritis Persists, initially delayed surgery due to blood cancer. Currently using walkers for mobility.  Chronic kidney disease, stage unspecified Chronic kidney disease with creatinine levels improved to 1.19 from previous 1.4. Adequate hydration reported, though occasional lapses  noted. - Continue to monitor creatinine levels  Plan - Lab reviewed, adequate for treatment, will proceed with nivolumab  injection today and continue every 4 weeks, tolerating well. - Will reach out to his urologist Dr. Alvaro for his follow-up  appointment, and surveillance CT scan. - follow Up in 4 weeks   SUMMARY OF ONCOLOGIC HISTORY: Oncology History Overview Note   Cancer Staging  Bladder cancer [facility]) Staging form: Urinary Bladder, AJCC 8th Edition - Clinical stage from 05/14/2021: Stage 0a (cTa, cN0, cM0) - Signed by Lanny Callander, MD on 01/02/2023 Stage prefix: Initial diagnosis WHO/ISUP grade (low/high): High Grade Histologic grading system: 2 grade system - Pathologic stage from 02/22/2022: Stage I (pT1, pN0, cM0) - Signed by Lanny Callander, MD on 01/02/2023 WHO/ISUP grade (low/high): High Grade Histologic grading system: 2 grade system     Bladder cancer (HCC)  05/14/2021 Cancer Staging   Staging form: Urinary Bladder, AJCC 8th Edition - Clinical stage from 05/14/2021: Stage 0a (cTa, cN0, cM0) - Signed by Lanny Callander, MD on 01/02/2023 Stage prefix: Initial diagnosis WHO/ISUP grade (low/high): High Grade Histologic grading system: 2 grade system   01/31/2022 Initial Diagnosis   Bladder cancer (HCC)   02/22/2022 Cancer Staging   Staging form: Urinary Bladder, AJCC 8th Edition - Pathologic stage from 02/22/2022: Stage I (pT1, pN0, cM0) - Signed by Lanny Callander, MD on 01/02/2023 WHO/ISUP grade (low/high): High Grade Histologic grading system: 2 grade system   01/12/2023 - 04/06/2023 Chemotherapy   Patient is on Treatment Plan : BLADDER Pembrolizumab  (200) q21d     05/05/2023 Pathology Results     FINAL MICROSCOPIC DIAGNOSIS:   A. BLADDER, RIGHT POSTERIOR, TURBT:  Noninvasive high grade papillary urothelial carcinoma  Muscularis propria (detrusor muscle) is not present   B. BLADDER NECK AND PROSTATIC URETHRA, BIOPSY:  High grade urothelial carcinoma  The carcinoma focally invades  muscularis propria (detrusor muscle)     05/05/2023 Cancer Staging   Staging form: Urinary Bladder, AJCC 8th Edition - Clinical stage from 05/05/2023: Stage II (cT2, cN0, cM0) - Signed by Lanny Callander, MD on 06/15/2023 WHO/ISUP grade (low/high): High Grade Histologic grading system: 2 grade system   06/01/2023 Imaging   CT Chest, Abdomen, and Pelvis with contrast  IMPRESSION: 1. Similar irregular asymmetric wall thickening along the posterior urinary bladder with progressive irregular thickening of the right ureter extending to the level of the renal pelvis where there is now a masslike 16 x 12 mm soft tissue focus, suspicious for direct ureteral extension of disease into the renal pelvis. 2. Irregular asymmetric wall thickening along the posterior urinary bladder measuring 14 mm is similar prior. 3. Right double-J ureteral stent is in place with persistent perinephric/periureteric stranding without significant hydronephrosis. 4. No convincing evidence of metastatic disease in the chest, abdomen or pelvis 5. Pancolonic diverticulosis without findings of acute diverticulitis. 6. Aortic Atherosclerosis (ICD10-I70.0) and Emphysema (ICD10-J43.9).   06/09/2023 - 08/17/2023 Chemotherapy   Patient is on Treatment Plan : BLADDER Cisplatin  D1 + Gemcitabine  D1,8 q21d x 4 Cycles     02/15/2024 -  Chemotherapy   Patient is on Treatment Plan : BLADDER Nivolumab  (480) q28d        Discussed the use of AI scribe software for clinical note transcription with the patient, who gave verbal consent to proceed.  History of Present Illness Robert [name] is a 73 year old male with blood cancer who presents for follow-up.  The bacterial infection in his back has resolved by recent blood work and MRI. He completed antibiotics about a month ago and his back pain is now minimal.  He still has chronic hip pain, which predates his cancer diagnosis. He is able to drive and uses two walkers for  mobility.  He has tolerated Zometa well with only mild fatigue for a couple of days and no rash or other new symptoms.  He develops blood in his urine when on blood thinners, which stops when the medication is held. He had a ureteral stent changed a couple of months ago.  His creatinine has improved to 1.19 from 1.4. He sometimes does not drink enough water .  His wife is now in a memory care facility, and he visits her almost daily. After cancer surgery and subsequent complications requiring 24/7 nursing care and multiple hospitalizations, he had significant muscle loss but is now regaining strength, is more active, and has transitioned from a hospital bed back to his regular bed.     All other systems were reviewed with the patient and are negative.  MEDICAL HISTORY:  Past Medical History:  Diagnosis Date   Anticoagulant long-term use    eliquis --- managed by cardiology   Aortic atherosclerosis 05/30/2024   Arthritis    Bladder cancer [facility])    urologist--- dr nieves;  overlapping   Cataract    History of basal cell carcinoma (BCC) excision    History of COVID-19 05/2019   per pt mild symptoms that resolved   HLD (hyperlipidemia)    HTN (hypertension)    Hx of colonic polyps    Insulin  dependent type 2 diabetes mellitus [facility])    endocrinologist--- dr trixie --  (03-11-2022 per pt check blood sugar 1-2 times daily,  fasting sugar-- 118-120s)   Nocturia    Paroxysmal atrial fibrillation (HCC) 03/12/2019   cardiologist--- dr calhoun   Renal disorder    Ureterocele    Use of cane as ambulatory aid     SURGICAL HISTORY: Past Surgical History:  Procedure Laterality Date   BLEPHAROPLASTY Bilateral    per pt approx  2001;   upper eyelid's   COLONOSCOPY     last one approx 2023   CYSTOSCOPY W/ RETROGRADES N/A 12/08/2023   Procedure: CYSTOSCOPY;  Surgeon: Alvaro Ricardo KATHEE Mickey., MD;  Location: [facility];  Service: Urology;  Laterality: N/A;  390 MINUTES NEEDED FOR CASE    CYSTOSCOPY WITH URETEROSCOPY AND STENT PLACEMENT Right 12/02/2022   Procedure: CYSTOSCOPY WITH RIGHT URETEROSCOPY, RIGHT DIAGNOSTIC URETEROSCOPY;  Surgeon: Nieves Cough, MD;  Location: [facility];  Service: Urology;  Laterality: Right;  90 MINS FOR CASE   CYSTOSCOPY/RETROGRADE/URETEROSCOPY Bilateral 05/05/2023   Procedure: CYSTOSCOPY BILATERAL RETROGRADE PYELOGRAM RIGHT URETEROSCOPY RIGHT STENT PLACEMENT;  Surgeon: Nieves Cough, MD;  Location: [facility];  Service: Urology;  Laterality: Bilateral;  90 MINS FOR CASE   IR IMAGING GUIDED PORT INSERTION  05/30/2023   IR LUMBAR DISC ASPIRATION W/IMG GUIDE  05/12/2024   IR REMOVAL TUN ACCESS W/ PORT W/O FL MOD SED  03/29/2024   PATELLAR TENDON REPAIR Left 04/16/2004   @[facility]    ROBOT ASSITED LAPAROSCOPIC NEPHROURETERECTOMY Right 12/08/2023   Procedure: RIGHT ROBOT ASSISTED Nephroureterectomy;  Surgeon: Alvaro Ricardo KATHEE Mickey., MD;  Location: [facility];  Service: Urology;  Laterality: Right;   ROBOT LAP RADICAL CYSTOPROSTATECTOMY PELVIC LYMPHADENECTOMY, NEOBLADDER  12/08/2023   Procedure: ROBOT ASSISTED LAPAROSCOPIC RADICAL CYSTOPROSTATECTOMY;  Surgeon: Alvaro Ricardo KATHEE Mickey., MD;  Location: [facility];  Service: Urology;;   TOTAL URETHRECTOMY,RADICAL N/A 12/08/2023   Procedure: TOTAL URETHRECTOMY,RADICAL;  Surgeon: Alvaro Ricardo KATHEE Mickey., MD;  Location: [facility];  Service: Urology;  Laterality: N/A;   TRANSESOPHAGEAL ECHOCARDIOGRAM (CATH LAB) N/A 04/02/2024   Procedure: TRANSESOPHAGEAL ECHOCARDIOGRAM;  Surgeon: Delford Maude BROCKS, MD;  Location: [facility] INVASIVE  CV LAB;  Service: Cardiovascular;  Laterality: N/A;   TRANSURETHRAL RESECTION OF BLADDER TUMOR N/A 06/18/2021   Procedure: TRANSURETHRAL RESECTION OF BLADDER TUMOR (TURBT) WITH RIGHT URETERAL STENT PLACEMENT, RIGHT URETEROSCOPY WITH DISTRACTION OF TUMOR, FULGURATION;  Surgeon: Nieves Cough, MD;  Location: [facility];  Service: Urology;  Laterality: N/A;   TRANSURETHRAL RESECTION OF  BLADDER TUMOR N/A 03/15/2022   Procedure: TRANSURETHRAL RESECTION OF BLADDER TUMOR (TURBT) WITH BILATERAL URETEROSCOPY/URETHRAL/ DILATION/BILATERAL RETROGRADE PYELOGRAM/BIOPSY AND FULGURATION OF RIGHT URETERAL CANCER/RIGHT STENT PLACEMENT;  Surgeon: Nieves Cough, MD;  Location: [facility];  Service: Urology;  Laterality: N/A;   TRANSURETHRAL RESECTION OF BLADDER TUMOR N/A 05/05/2023   Procedure: TRANSURETHRAL RESECTION OF BLADDER TUMOR (TURBT) BLADDER BIOSPY;  Surgeon: Nieves Cough, MD;  Location: [facility];  Service: Urology;  Laterality: N/A;   TRANSURETHRAL RESECTION OF BLADDER TUMOR WITH MITOMYCIN -C N/A 12/02/2022   Procedure: TRANSURETHRAL RESECTION OF BLADDER TUMOR;  Surgeon: Nieves Cough, MD;  Location: [facility];  Service: Urology;  Laterality: N/A;   TRANSURETHRAL RESECTION OF PROSTATE  05/05/2023   Procedure: TRANSURETHRAL RESECTION OF THE PROSTATE (TURP);  Surgeon: Nieves Cough, MD;  Location: [facility];  Service: Urology;;    I have reviewed the social history and family history with the patient and they are unchanged from previous note.  ALLERGIES:  is allergic to lisinopril .  MEDICATIONS:  Current Outpatient Medications  Medication Sig Dispense Refill   amoxicillin  (AMOXIL ) 500 MG capsule TAKE 2 CAPSULES (1,000 MG TOTAL) BY MOUTH 3 (THREE) TIMES DAILY. 180 capsule 0   apixaban  (ELIQUIS ) 5 MG TABS tablet Take 1 tablet (5 mg total) by mouth 2 (two) times daily. 180 tablet 1   atorvastatin  (LIPITOR) 40 MG tablet Take 1 tablet (40 mg total) by mouth at bedtime.     B Complex-C (B-COMPLEX WITH VITAMIN C) tablet Take 1 tablet by mouth 3 (three) times a week.     Cholecalciferol  (VITAMIN D -3 PO) Take 1 tablet by mouth 3 (three) times a week.     EPINEPHrine  0.3 mg/0.3 mL IJ SOAJ injection Inject 0.3 mg into the muscle as needed for anaphylaxis. 1 each 0   furosemide  (LASIX ) 20 MG tablet TAKE 1 TO 2 TABLETS BY MOUTH DAILY  AS NEEDED (Patient not taking: Reported on 07/10/2024) 180 tablet 1   gabapentin  (NEURONTIN ) 300 MG capsule Take 300 mg by mouth 3 (three) times daily.     lidocaine  (LIDODERM ) 5 % Place 1 patch onto the skin daily. Remove & Discard patch within 12 hours or as directed by MD 30 patch 0   methocarbamol  (ROBAXIN ) 500 MG tablet Take 1 tablet (500 mg total) by mouth every 8 (eight) hours as needed for muscle spasms. 60 tablet 0   metoprolol  succinate (TOPROL -XL) 25 MG 24 hr tablet Take 1 tablet (25 mg total) by mouth daily. 90 tablet 2   oxyCODONE  (OXY IR/ROXICODONE ) 5 MG immediate release tablet Take 1 tablet (5 mg total) by mouth every 6 (six) hours as needed for moderate pain (pain score 4-6). 15 tablet 0   oxyCODONE  (OXYCONTIN ) 15 mg 12 hr tablet Take 1 tablet (15 mg total) by mouth every 12 (twelve) hours. 10 tablet 0   polyethylene glycol (MIRALAX  / GLYCOLAX ) 17 g packet Take 17 g by mouth daily.     propranolol  (INDERAL ) 10 MG tablet TAKE 1 TABLET BY MOUTH 4 TIMES DAILY AS NEEDED FOR PALPITATIONS, IRREGULAR OR FAST HEART RATE 270 tablet 1   Semaglutide , 1  MG/DOSE, (OZEMPIC , 1 MG/DOSE,) 4 MG/3ML SOPN INJECT 1 MG UNDER THE SKIN ONCE A WEEK AS DIRECTED 9 mL 0   senna-docusate (SENOKOT-S) 8.6-50 MG tablet Take 1-2 tablets by mouth 2 (two) times daily between meals as needed for mild constipation or moderate constipation.     TYLENOL  500 MG tablet Take 1,000 mg by mouth daily as needed for mild pain (pain score 1-3), headache or moderate pain (pain score 4-6).     No current facility-administered medications for this visit.   Facility-Administered Medications Ordered in Other Visits  Medication Dose Route Frequency Provider Last Rate Last Admin   sodium chloride  flush (NS) 0.9 % injection 10 mL  10 mL Intracatheter PRN Lanny Callander, MD   10 mL at 02/15/24 1353    PHYSICAL EXAMINATION: ECOG PERFORMANCE STATUS: 2 - Symptomatic, <50% confined to bed  Vitals:   09/25/24 1353  BP: 125/71  Pulse: 71   Resp: 17  Temp: 98.2 F (36.8 C)  SpO2: 100%   Wt Readings from Last 3 Encounters:  09/25/24 188 lb 4.8 oz (85.4 kg)  09/24/24 195 lb 12.8 oz (88.8 kg)  08/28/24 185 lb 11.2 oz (84.2 kg)     GENERAL:alert, no distress and comfortable SKIN: skin color, texture, turgor are normal, no rashes or significant lesions EYES: normal, Conjunctiva are pink and non-injected, sclera clear NECK: supple, thyroid  normal size, non-tender, without nodularity LYMPH:  no palpable lymphadenopathy in the cervical, axillary  LUNGS: clear to auscultation and percussion with normal breathing effort HEART: regular rate & rhythm and no murmurs and no lower extremity edema ABDOMEN:abdomen soft, non-tender and normal bowel sounds Musculoskeletal:no cyanosis of digits and no clubbing  NEURO: alert & oriented x 3 with fluent speech, no focal motor/sensory deficits  Physical Exam    LABORATORY DATA:  I have reviewed the data as listed    Latest Ref Rng & Units 09/25/2024   12:55 PM 08/28/2024    1:09 PM 07/31/2024    1:16 PM  CBC  WBC 4.0 - 10.5 K/uL 8.6  7.2  8.2   Hemoglobin 13.0 - 17.0 g/dL 85.5  85.4  85.9   Hematocrit 39.0 - 52.0 % 42.2  42.7  41.1   Platelets 150 - 400 K/uL 159  174  175         Latest Ref Rng & Units 09/25/2024   12:55 PM 08/28/2024    1:09 PM 07/31/2024    1:16 PM  CMP  Glucose 70 - 99 mg/dL 856  765  820   BUN 8 - 23 mg/dL 22  24  25    Creatinine 0.61 - 1.24 mg/dL 8.80  8.57  8.82   Sodium 135 - 145 mmol/L 139  136  137   Potassium 3.5 - 5.1 mmol/L 4.0  4.7  3.9   Chloride 98 - 111 mmol/L 105  103  104   CO2 22 - 32 mmol/L 24  27  25    Calcium  8.9 - 10.3 mg/dL 9.7  9.5  89.4   Total Protein 6.5 - 8.1 g/dL 7.3  7.3  7.9   Total Bilirubin 0.0 - 1.2 mg/dL 0.4  0.4  0.3   Alkaline Phos 38 - 126 U/L 94  88  93   AST 15 - 41 U/L 24  18  14    ALT 0 - 44 U/L 26  15  18        RADIOGRAPHIC STUDIES: I have personally reviewed the radiological images as listed and  agreed  with the findings in the report. No results found.    Orders Placed This Encounter  Procedures   CBC with Differential (Cancer Center Only)    Standing Status:   Future    Expected Date:   01/15/2025    Expiration Date:   01/15/2026   CMP (Cancer Center only)    Standing Status:   Future    Expected Date:   01/15/2025    Expiration Date:   01/15/2026   T4    Standing Status:   Future    Expected Date:   01/15/2025    Expiration Date:   01/15/2026   TSH    Standing Status:   Future    Expected Date:   01/15/2025    Expiration Date:   01/15/2026   All questions were answered. The patient knows to call the clinic with any problems, questions or concerns. No barriers to learning was detected. The total time spent in the appointment was 25 minutes, including review of chart and various tests results, discussions about plan of care and coordination of care plan     Onita Mattock, MD 09/25/2024

## 2024-09-26 LAB — T4: T4, Total: 8 ug/dL (ref 4.5–12.0)

## 2024-10-08 ENCOUNTER — Telehealth (HOSPITAL_BASED_OUTPATIENT_CLINIC_OR_DEPARTMENT_OTHER): Payer: Self-pay | Admitting: *Deleted

## 2024-10-08 NOTE — Telephone Encounter (Signed)
° °  Pre-operative Risk Assessment    Patient Name: Robert [name]  DOB: Feb 10, 1951 MRN: [id number]   Date of last office visit: 10/03/23 DR. NAHSER Date of next office visit: NONE   Request for Surgical Clearance    Procedure:  RIGHT TOTAL HIP ARTHROPLASTY  Date of Surgery:  Clearance 01/21/25                                Surgeon:  DR. MATTHEW OLIN Surgeon's Group or Practice Name:  [facility] Phone number:  [phone] [facility] Fax number:  [phone]   Type of Clearance Requested:   - Medical  - Pharmacy:  Hold Apixaban  (Eliquis )     Type of Anesthesia:  Spinal   Additional requests/questions:    [name]   10/08/2024, 11:03 AM

## 2024-10-10 ENCOUNTER — Inpatient Hospital Stay

## 2024-10-21 ENCOUNTER — Other Ambulatory Visit: Payer: Self-pay

## 2024-10-21 NOTE — Telephone Encounter (Signed)
 Pre op Office visit now scheduled pt agrees to appointment date and time.

## 2024-10-21 NOTE — Telephone Encounter (Signed)
" ° °  Name: [name]  DOB: Oct 03, 1951  MRN: [id number]  Primary Cardiologist: Aleene Passe, MD (Inactive)  Chart reviewed as part of pre-operative protocol coverage. Because of Robert [name]'s past medical history and time since last visit, he will require a follow-up in-office visit in order to better assess preoperative cardiovascular risk. Last seen in 2024.   Per office protocol, patient can hold Eliquis  for 3 days prior to procedure.   Patient will not need bridging with Lovenox  (enoxaparin ) around procedure.  Pre-op covering staff: - Please schedule appointment and call patient to inform them. If patient already had an upcoming appointment within acceptable timeframe, please add pre-op clearance to the appointment notes so provider is aware. - Please contact requesting surgeon's office via preferred method (i.e, phone, fax) to inform them of need for appointment prior to surgery.    Robert Satterfield, NP  10/21/2024, 7:43 AM   "

## 2024-10-21 NOTE — Telephone Encounter (Signed)
 Patient with diagnosis of atrial fibrillation on Eliquis  for anticoagulation.    Procedure:  RIGHT TOTAL HIP ARTHROPLASTY   Date of Surgery:  Clearance 01/21/25   CHA2DS2-VASc Score = 3   This indicates a 3.2% annual risk of stroke. The patient's score is based upon: CHF History: 0 HTN History: 1 Diabetes History: 1 Stroke History: 0 Vascular Disease History: 0 Age Score: 1 Gender Score: 0   CrCl 66 Platelet count 159  Patient has not had an Afib/aflutter ablation in the last 3 months, DCCV within the last 4 weeks or a watchman implanted in the last 45 days   Per office protocol, patient can hold Eliquis  for 3 days prior to procedure.   Patient will not need bridging with Lovenox  (enoxaparin ) around procedure.  **This guidance is not considered finalized until pre-operative APP has relayed final recommendations.**

## 2024-10-21 NOTE — Patient Outreach (Signed)
 Complex Care Management   Visit Note  10/21/2024  Name:  Robert [name] MRN: [id number] DOB: 01-06-1951  Situation: Referral received for Complex Care Management related to bladder ca(left ureterostomy; discitis/myelomililtis back(patient denies any concerns at this time  I obtained verbal consent from Patient.  Visit completed with Patient  on the phone  Background:   Past Medical History:  Diagnosis Date   Anticoagulant long-term use    eliquis --- managed by cardiology   Aortic atherosclerosis 05/30/2024   Arthritis    Bladder cancer [facility])    urologist--- dr nieves;  overlapping   Cataract    History of basal cell carcinoma (BCC) excision    History of COVID-19 05/2019   per pt mild symptoms that resolved   HLD (hyperlipidemia)    HTN (hypertension)    Hx of colonic polyps    Insulin  dependent type 2 diabetes mellitus [facility])    endocrinologist--- dr trixie --  (03-11-2022 per pt check blood sugar 1-2 times daily,  fasting sugar-- 118-120s)   Nocturia    Paroxysmal atrial fibrillation (HCC) 03/12/2019   cardiologist--- dr calhoun   Renal disorder    Ureterocele    Use of cane as ambulatory aid     Assessment: Patient Reported Symptoms:  Cognitive Cognitive Status: No symptoms reported      Neurological Neurological Review of Symptoms: No symptoms reported    HEENT HEENT Symptoms Reported: No symptoms reported      Cardiovascular Cardiovascular Symptoms Reported: No symptoms reported    Respiratory Respiratory Symptoms Reported: No symptoms reported    Endocrine Endocrine Symptoms Reported: No symptoms reported Is patient diabetic?: Yes Is patient checking blood sugars at home?: Yes    Gastrointestinal Gastrointestinal Symptoms Reported: No symptoms reported      Genitourinary Genitourinary Symptoms Reported: No symptoms reported Other Genitourinary Symptoms: left ureterostomy; history of bladder cancer-patient receiving monthly treatments. He denies  any questions or concerns at this time. upcoming appointment with ostomy nurse to review management/care with ostomy nurse. Genitourinary Management Strategies: Urostomy  Integumentary Integumentary Symptoms Reported: No symptoms reported    Musculoskeletal Musculoskelatal Symptoms Reviewed: Difficulty walking Additional Musculoskeletal Details: per patient right hip arthritis. patient reports planned surgery scheduled for march 2026. patient states pain 3/10 on the pain scale-followed by orthopedic provider.        Psychosocial Psychosocial Symptoms Reported: No symptoms reported Other Psychosocial Conditions: patient continues to visit spouse(in memory care unit) at least 2-3 days/week. patient denies any concerns at this time.          There were no vitals filed for this visit. Pain Scale: 0-10 Pain Score: 3  Pain Type: Chronic pain Pain Location: Hip Pain Orientation: Right Pain Onset:  (reports off and on) Patients Stated Pain Goal: 3  Medications Reviewed Today     Reviewed by Arlesia Kiel M, RN (Registered Nurse) on 10/21/24 at 1142  Med List Status: <None>   Medication Order Taking? Sig Documenting Provider Last Dose Status Informant  amoxicillin  (AMOXIL ) 500 MG capsule [id number]  TAKE 2 CAPSULES (1,000 MG TOTAL) BY MOUTH 3 (THREE) TIMES DAILY.  Patient not taking: Reported on 10/21/2024   Dennise Kingsley, MD  Active   apixaban  (ELIQUIS ) 5 MG TABS tablet [id number] Yes Take 1 tablet (5 mg total) by mouth 2 (two) times daily. Nahser, Aleene PARAS, MD  Active Self, Pharmacy Records  atorvastatin  (LIPITOR) 40 MG tablet [id number] Yes Take 1 tablet (40 mg total) by mouth at bedtime. Cheryle Page, MD  Active Self, Pharmacy Records           Med Note (CRUTHIS, CHLOE C   Thu May 30, 2024 12:27 PM) No fill hx found. Pt is adamant he is taking this.   B Complex-C (B-COMPLEX WITH VITAMIN C) tablet [id number] Yes Take 1 tablet by mouth 3 (three) times a week. [provider]   Active Self, Pharmacy Records           Med Note [facility] [place], SUSAN A   Sun Feb 04, 2024  4:51 PM)    Cholecalciferol  (VITAMIN D -3 PO) [id number] Yes Take 1 tablet by mouth 3 (three) times a week. [provider]  Active Self, Pharmacy Records           Med Note (CRUTHIS, CHLOE C   Thu May 30, 2024 10:05 AM)    EPINEPHrine  0.3 mg/0.3 mL IJ SOAJ injection [id number]  Inject 0.3 mg into the muscle as needed for anaphylaxis. Victor Lynwood DASEN, PA-C  Active Self, Pharmacy Records  furosemide  (LASIX ) 20 MG tablet [id number]  TAKE 1 TO 2 TABLETS BY MOUTH DAILY AS NEEDED  Patient not taking: Reported on 07/10/2024   Plotnikov, Aleksei V, MD  Active   gabapentin  (NEURONTIN ) 300 MG capsule [id number]  Take 300 mg by mouth 3 (three) times daily. [provider]  Active Self, Pharmacy Records  lidocaine  (LIDODERM ) 5 % [id number] Yes Place 1 patch onto the skin daily. Remove & Discard patch within 12 hours or as directed by MD Arlon Carliss ORN, DO  Active Self, Pharmacy Records           Med Note (CRUTHIS, CHLOE C   Thu May 30, 2024 10:04 AM)    methocarbamol  (ROBAXIN ) 500 MG tablet [id number] Yes Take 1 tablet (500 mg total) by mouth every 8 (eight) hours as needed for muscle spasms. Arlon Carliss ORN, DO  Active Self, Pharmacy Records  metoprolol  succinate (TOPROL -XL) 25 MG 24 hr tablet [id number] Yes Take 1 tablet (25 mg total) by mouth daily. Nahser, Aleene PARAS, MD  Active Self, Pharmacy Records  oxyCODONE  (OXY IR/ROXICODONE ) 5 MG immediate release tablet [id number]  Take 1 tablet (5 mg total) by mouth every 6 (six) hours as needed for moderate pain (pain score 4-6). Jillian Buttery, MD  Active   oxyCODONE  (OXYCONTIN ) 15 mg 12 hr tablet [id number]  Take 1 tablet (15 mg total) by mouth every 12 (twelve) hours. Jillian Buttery, MD  Active   polyethylene glycol (MIRALAX  / GLYCOLAX ) 17 g packet [id number] Yes Take 17 g by mouth daily. Jillian Buttery, MD  Active   propranolol  (INDERAL ) 10 MG tablet  [id number] Yes TAKE 1 TABLET BY MOUTH 4 TIMES DAILY AS NEEDED FOR PALPITATIONS, IRREGULAR OR FAST HEART RATE Nahser, Aleene PARAS, MD  Active Self, Pharmacy Records           Med Note [place], NATHANEL SAILOR   Fri May 10, 2024  5:08 PM)    Semaglutide , 1 MG/DOSE, (OZEMPIC , 1 MG/DOSE,) 4 MG/3ML NELMA [id number] Yes INJECT 1 MG UNDER THE SKIN ONCE A WEEK AS DIRECTED Trixie File, MD  Active   senna-docusate (SENOKOT-S) 8.6-50 MG tablet [id number] Yes Take 1-2 tablets by mouth 2 (two) times daily between meals as needed for mild constipation or moderate constipation. Gonfa, Taye T, MD  Active Self, Pharmacy Records  sodium chloride  flush (NS) 0.9 % injection 10 mL [id number]   Lanny Callander, MD  Active   TYLENOL  500 MG tablet [id number] Yes Take 1,000 mg by  mouth daily as needed for mild pain (pain score 1-3), headache or moderate pain (pain score 4-6). [provider]  Active Self, Pharmacy Records          Recommendation:   Continue Current Plan of Care  Follow Up Plan:   Telephone follow up appointment date/time:  12/09/24 at 1:00 pm per patient request  Heddy Shutter, RN, MSN, BSN, CCM [place]  [facility], Population Health Case Manager Phone: [phone]

## 2024-10-21 NOTE — Patient Instructions (Signed)
 Visit Information  Thank you for taking time to visit with me today. Please don't hesitate to contact me if I can be of assistance to you before our next scheduled appointment.  Your next care management appointment is by telephone on 10/08/25 at 1:00 pm per patient request.   Please call the care guide team at [phone] if you need to cancel, schedule, or reschedule an appointment.   Please call the Suicide and Crisis Lifeline: 988 call the [place]  National Suicide Prevention Lifeline: 307-282-9246 or TTY: (254) 028-8619 TTY [phone]) to talk to a trained counselor if you are experiencing a Mental Health or Behavioral Health Crisis or need someone to talk to.   Heddy Shutter, RN, MSN, BSN, CCM [place]  [facility], Population Health Case Manager Phone: [phone]

## 2024-10-23 ENCOUNTER — Inpatient Hospital Stay

## 2024-10-23 VITALS — BP 118/70 | HR 67 | Temp 98.7°F | Resp 18

## 2024-10-23 DIAGNOSIS — Z5112 Encounter for antineoplastic immunotherapy: Secondary | ICD-10-CM | POA: Diagnosis not present

## 2024-10-23 DIAGNOSIS — C679 Malignant neoplasm of bladder, unspecified: Secondary | ICD-10-CM

## 2024-10-23 LAB — COMPREHENSIVE METABOLIC PANEL WITH GFR
ALT: 26 U/L (ref 0–44)
AST: 22 U/L (ref 15–41)
Albumin: 4.3 g/dL (ref 3.5–5.0)
Alkaline Phosphatase: 98 U/L (ref 38–126)
Anion gap: 13 (ref 5–15)
BUN: 25 mg/dL — ABNORMAL HIGH (ref 8–23)
CO2: 23 mmol/L (ref 22–32)
Calcium: 9.7 mg/dL (ref 8.9–10.3)
Chloride: 104 mmol/L (ref 98–111)
Creatinine, Ser: 1.25 mg/dL — ABNORMAL HIGH (ref 0.61–1.24)
GFR, Estimated: >60 mL/min
Glucose, Bld: 166 mg/dL — ABNORMAL HIGH (ref 70–99)
Potassium: 4.4 mmol/L (ref 3.5–5.1)
Sodium: 140 mmol/L (ref 135–145)
Total Bilirubin: 0.4 mg/dL (ref 0.0–1.2)
Total Protein: 7.2 g/dL (ref 6.5–8.1)

## 2024-10-23 LAB — CBC WITH DIFFERENTIAL/PLATELET
Abs Immature Granulocytes: 0.06 K/uL (ref 0.00–0.07)
Basophils Absolute: 0.1 K/uL (ref 0.0–0.1)
Basophils Relative: 1 %
Eosinophils Absolute: 0.1 K/uL (ref 0.0–0.5)
Eosinophils Relative: 1 %
HCT: 43.2 % (ref 39.0–52.0)
Hemoglobin: 14.4 g/dL (ref 13.0–17.0)
Immature Granulocytes: 1 %
Lymphocytes Relative: 31 %
Lymphs Abs: 2.7 K/uL (ref 0.7–4.0)
MCH: 28.4 pg (ref 26.0–34.0)
MCHC: 33.3 g/dL (ref 30.0–36.0)
MCV: 85.2 fL (ref 80.0–100.0)
Monocytes Absolute: 0.6 K/uL (ref 0.1–1.0)
Monocytes Relative: 7 %
Neutro Abs: 5.3 K/uL (ref 1.7–7.7)
Neutrophils Relative %: 59 %
Platelets: 169 K/uL (ref 150–400)
RBC: 5.07 MIL/uL (ref 4.22–5.81)
RDW: 16.3 % — ABNORMAL HIGH (ref 11.5–15.5)
WBC: 8.9 K/uL (ref 4.0–10.5)
nRBC: 0 % (ref 0.0–0.2)

## 2024-10-23 LAB — TSH: TSH: 1.49 u[IU]/mL (ref 0.350–4.500)

## 2024-10-23 MED ORDER — NIVOLUMAB-HYALURONIDASE-NVHY 600-10000 MG-UT/5ML ~~LOC~~ SOLN
1200.0000 mg | Freq: Once | SUBCUTANEOUS | Status: AC
  Administered 2024-10-23: 1200 mg via SUBCUTANEOUS
  Filled 2024-10-23: qty 10

## 2024-10-23 NOTE — Patient Instructions (Signed)
 [facility] [place] - A DEPT OF [place]  Discharge Instructions: Thank you for choosing [facility] to provide your oncology and hematology care.  If you have a lab appointment with the Cancer Center - please note that after April 8th, 2024, all labs will be drawn in the cancer center.  You do not have to check in or register with the main entrance as you have in the past but will complete your check-in in the cancer center.  Wear comfortable clothing and clothing appropriate for easy access to any Portacath or PICC line.   We strive to give you quality time with your provider. You may need to reschedule your appointment if you arrive late (15 or more minutes).  Arriving late affects you and other patients whose appointments are after yours.  Also, if you miss three or more appointments without notifying the office, you may be dismissed from the clinic at the providers discretion.      For prescription refill requests, have your pharmacy contact our office and allow 72 hours for refills to be completed.    Today you received the following chemotherapy and/or immunotherapy agents QVANTIG.  Nivolumab ; Hyaluronidase  Injection What is this medication? NIVOLUMAB ; HYALURONIDASE  (nye VOL ue mab; hye al ur ON i dase) treats some types of cancer. It works by helping your immune system slow or stop the spread of cancer cells. It is a monoclonal antibody. This medicine may be used for other purposes; ask your health care provider or pharmacist if you have questions. COMMON BRAND NAME(S): OPDIVO  QVANTIG What should I tell my care team before I take this medication? They need to know if you have any of these conditions: Autoimmune conditions, such as Crohn disease, ulcerative colitis, lupus Have received or plan to receive a stem cell transplant that uses donor stem cells (allogeneic) Have had radiation therapy to your chest Nervous system conditions, such as  Guillain-Barre syndrome or myasthenia gravis Organ transplant An unusual or allergic reaction to nivolumab , hyaluronidase , other medications, foods, dyes, or preservatives Pregnant or trying to get pregnant Breastfeeding How should I use this medication? This medication is injected under the skin. It is given by your care team in a hospital or clinic setting. A special MedGuide will be given to you before each treatment. Be sure to read this information carefully each time. Talk to your care team about the use of this medication in children. Special care may be needed. Overdosage: If you think you have taken too much of this medicine contact a poison control center or emergency room at once. NOTE: This medicine is only for you. Do not share this medicine with others. What if I miss a dose? Keep appointments for follow-up doses. It is important not to miss your dose. Call your care team if you are unable to keep an appointment. What may interact with this medication? Interactions have not been studied. This list may not describe all possible interactions. Give your health care provider a list of all the medicines, herbs, non-prescription drugs, or dietary supplements you use. Also tell them if you smoke, drink alcohol , or use illegal drugs. Some items may interact with your medicine. What should I watch for while using this medication? Your condition will be monitored carefully while you are receiving this medication. You may need blood work done while you are taking this medication. This medication may cause serious skin reactions. They can happen weeks to months after starting  the medication. Contact your care team right away if you notice fevers or flu-like symptoms with a rash. The rash may be red or purple and then turn into blisters or peeling of the skin. You may also notice a red rash with swelling of the face, lips, or lymph nodes in your neck or under your arms. Talk to your care team if  you may be pregnant. Serious birth defects can occur if you take this medication during pregnancy and for 5 months after the last dose. You will need a negative pregnancy test before starting this medication. Contraception is recommended while taking this medication and for 5 months after the last dose. Your care team can help you find the option that works for you. Do not breastfeed while taking this medication and for 5 months after the last dose. What side effects may I notice from receiving this medication? Side effects that you should report to your care team as soon as possible: Allergic reactions--skin rash, itching, hives, swelling of the face, lips, tongue, or throat Dry cough, shortness of breath or trouble breathing Eye pain, redness, irritation, or discharge with blurry or decreased vision Heart muscle inflammation--unusual weakness or fatigue, shortness of breath, chest pain, fast or irregular heartbeat, dizziness, swelling of the ankles, feet, or hands Hormone gland problems--headache, sensitivity to light, unusual weakness or fatigue, dizziness, fast or irregular heartbeat, increased sensitivity to cold or heat, excessive sweating, constipation, hair loss, increased thirst or amount of urine, tremors or shaking, irritability Kidney injury (glomerulonephritis)--decrease in the amount of urine, red or dark brown urine, foamy or bubbly urine, swelling of the ankles, hands, or feet Liver injury--right upper belly pain, loss of appetite, nausea, light-colored stool, dark yellow or brown urine, yellowing skin or eyes, unusual weakness or fatigue Pain, tingling, or numbness in the hands or feet, muscle weakness, change in vision, confusion or trouble speaking, loss of balance or coordination, trouble walking, seizures Rash, fever, and swollen lymph nodes Redness, blistering, peeling, or loosening of the skin, including inside the mouth Sudden or severe stomach pain, bloody diarrhea, fever,  nausea, vomiting Side effects that usually do not require medical attention (report these to your care team if they continue or are bothersome): Bone, joint, or muscle pain Diarrhea Fatigue Loss of appetite Nausea Skin rash This list may not describe all possible side effects. Call your doctor for medical advice about side effects. You may report side effects to FDA at 1-800-FDA-1088. Where should I keep my medication? This medication is given in a hospital or clinic. It will not be stored at home. NOTE: This sheet is a summary. It may not cover all possible information. If you have questions about this medicine, talk to your doctor, pharmacist, or health care provider.  2025 Elsevier/Gold Standard (2023-12-04 00:00:00)   To help prevent nausea and vomiting after your treatment, we encourage you to take your nausea medication as directed.  BELOW ARE SYMPTOMS THAT SHOULD BE REPORTED IMMEDIATELY: *FEVER GREATER THAN 100.4 F (38 C) OR HIGHER *CHILLS OR SWEATING *NAUSEA AND VOMITING THAT IS NOT CONTROLLED WITH YOUR NAUSEA MEDICATION *UNUSUAL SHORTNESS OF BREATH *UNUSUAL BRUISING OR BLEEDING *URINARY PROBLEMS (pain or burning when urinating, or frequent urination) *BOWEL PROBLEMS (unusual diarrhea, constipation, pain near the anus) TENDERNESS IN MOUTH AND THROAT WITH OR WITHOUT PRESENCE OF ULCERS (sore throat, sores in mouth, or a toothache) UNUSUAL RASH, SWELLING OR PAIN  UNUSUAL VAGINAL DISCHARGE OR ITCHING   Items with * indicate a potential emergency and should  be followed up as soon as possible or go to the Emergency Department if any problems should occur.  Please show the CHEMOTHERAPY ALERT CARD or IMMUNOTHERAPY ALERT CARD at check-in to the Emergency Department and triage nurse.  Should you have questions after your visit or need to cancel or reschedule your appointment, please contact [facility] [place] - A DEPT OF [facility] [phone]  and follow  the prompts.  Office hours are 8:00 a.m. to 4:30 p.m. Monday - Friday. Please note that voicemails left after 4:00 p.m. may not be returned until the following business day.  We are closed weekends and major holidays. You have access to a nurse at all times for urgent questions. Please call the main number to the clinic [phone] and follow the prompts.  For any non-urgent questions, you may also contact your provider using MyChart. We now offer e-Visits for anyone 34 and older to request care online for non-urgent symptoms. For details visit [email or web address].   Also download the MyChart app! Go to the app store, search MyChart, open the app, select [place], and log in with your MyChart username and password.

## 2024-10-23 NOTE — Progress Notes (Signed)
 Patient presents today for chemotherapy/immunotherapy injection of QVANTIG.  Patient is in satisfactory condition with no new complaints voiced.  Vital signs are stable.  Labs reviewed and all labs are within treatment parameters.  We will proceed with treatment per MD orders.    Patient tolerated treatment well with no complaints voiced.  Patient left ambulatory in stable condition.  Vital signs stable at discharge.  Follow up as scheduled.

## 2024-10-24 LAB — T4: T4, Total: 8.2 ug/dL (ref 4.5–12.0)

## 2024-10-29 NOTE — Progress Notes (Unsigned)
 " Cardiology Office Note:  .   Date:  10/30/2024  ID:  Robert [name], DOB 05/20/1951, MRN [id number] PCP: Garald Karlynn GAILS, MD  [facility] Providers Cardiologist:  Aleene Passe, MD (Inactive)   History of Present Illness: .    Chief Complaint  Patient presents with   Pre-op Exam    Robert [name] is a 74 y.o. male with below history who presents for follow-up.   History of Present Illness   Robert [name] is a 74 year old male who presents for a preoperative assessment for hip replacement surgery.  He is scheduled for hip replacement surgery on March 31st. He has a history of paroxysmal atrial fibrillation, managed with propranolol  as needed, taken every two to three months, and is currently on Eliquis  5 mg twice daily and metoprolol  succinate 25 mg daily. He reports a history of paroxysmal atrial fibrillation and has never experienced a heart attack or stroke.  He has a history of bladder cancer, which necessitated the removal of his bladder, prostate, and right kidney on February 14th. Post-surgery, he developed severe back pain due to a bacterial infection affecting his L4 and L5 vertebrae. He was treated with a PICC line and continuous antibiotics for two months, resolving the infection. The infection caused some spinal damage, and the spine specialist indicated that it has naturally fused and surgery is not required.  He is managing diabetes, with a recent A1c of 7.1, and is scheduled to see his endocrinologist in early February. He is on Lipitor 40 mg for hyperlipidemia, with a recent LDL of 47. His blood pressure is well controlled.  In terms of physical activity, he is limited by his knees and hip but not by breathing difficulties. He engages in light exercises such as leg lifts and dumbbell curls while seated. He remains mobile around his house using a walker, which he finds more practical than installing a ramp. He previously used a hospital  bed due to severe pain but now prefers to stay active and on his feet.  No chest pain or trouble breathing. He is able to perform light exercises and remains mobile with the use of a walker.           Problem List Paroxysmal atrial fibrillation  HTN HLD -T chol 116, HDL 39, LDL 47, TG 150 DM -A1c 7.1 5. Bladder CA    ROS: All other ROS reviewed and negative. Pertinent positives noted in the HPI.     Studies Reviewed: SABRA   EKG Interpretation Date/Time:  Wednesday October 30 2024 16:11:34 EST Ventricular Rate:  80 PR Interval:  168 QRS Duration:  74 QT Interval:  360 QTC Calculation: 415 R Axis:   -5  Text Interpretation: Normal sinus rhythm Nonspecific T wave abnormality Confirmed by Barbaraann Kotyk ([id number]) on 10/30/2024 4:19:26 PM   CMR 12/28/2023 IMPRESSION: 1. Normal LV size with mild concentric LV hypertrophy. No apical aneurysm or thinning. LV EF 60%, normal wall motion.   2.  Normal RV size and systolic function, RV EF 52%.   3. Subtle mid-wall stripe of LGE in the basal septum. No significant LGE noted at the apex.   4. Elevated extracellular volume percentage at 36%, suggests increased myocardial fibrosis.   This study is probably most consistent with long-standing HTN and LVH/increased fibrosis as a result. This study is not highly suggestive of apical hypertrophic cardiomyopathy. In the absence of hypertension, it could be a low-risk variant of  HCM.  TTE 11/03/2023  1. Moderate asymmetric hypertrophy of the apical segments and apex up to  1.4 cm. 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 No apical aneurysm seen on contrast imaging. Would  recommend cardiac MRI for better characterization.   Left ventricular ejection fraction, by estimation, is 65 to 70%. The left  ventricle has normal function. The left ventricle has no regional wall  motion abnormalities. There is moderate asymmetric left ventricular  hypertrophy of the apical  segment. Left  ventricular diastolic parameters are consistent with Grade [name] diastolic  dysfunction (pseudonormalization).   2. Right ventricular systolic function is normal. The right ventricular  size is normal. Tricuspid regurgitation signal is inadequate for assessing  PA pressure.   3. Left atrial size was mild to moderately dilated.   4. The mitral valve is degenerative. Trivial mitral valve regurgitation.  No evidence of mitral stenosis. Moderate mitral annular calcification.   5. The aortic valve is tricuspid. There is mild calcification of the  aortic valve. There is mild thickening of the aortic valve. Aortic valve  regurgitation is mild. Aortic valve sclerosis/calcification is present,  without any evidence of aortic  stenosis.   6. Aortic dilatation noted. There is mild dilatation of the aortic root,  measuring 42 mm. There is mild dilatation of the ascending aorta,  measuring 41 mm.   7. The inferior vena cava is normal in size with greater than 50%  respiratory variability, suggesting right atrial pressure of 3 mmHg.    Physical Exam:   VS:  BP 110/66   Pulse 80   Ht 6' 3 (1.905 m)   Wt 192 lb (87.1 kg)   SpO2 98%   BMI 24.00 kg/m    Wt Readings from Last 3 Encounters:  10/30/24 192 lb (87.1 kg)  09/25/24 188 lb 4.8 oz (85.4 kg)  09/24/24 195 lb 12.8 oz (88.8 kg)    GEN: Well nourished, well developed in no acute distress NECK: No JVD; No carotid bruits CARDIAC: RRR, no murmurs, rubs, gallops RESPIRATORY:  Clear to auscultation without rales, wheezing or rhonchi  ABDOMEN: Soft, non-tender, non-distended EXTREMITIES:  No edema; No deformity  ASSESSMENT AND PLAN: .   Assessment and Plan    Preoperative cardiovascular evaluation Cardiovascular status stable, cleared for hip replacement surgery. - Proceed with hip replacement surgery as planned. - Hold Eliquis  before surgery.  Paroxysmal atrial fibrillation Currently not in AFib, managed with propranolol   and Eliquis . - Continue Eliquis  5 mg twice daily. - Continue metoprolol .   Type 2 diabetes mellitus Diabetes well-controlled with recent A1c of 7.1. - Continue current diabetes management. - Follow up with endocrinologist in February.  Hyperlipidemia LDL at goal with Lipitor 40 mg. - Continue Lipitor 40 mg daily.  Essential hypertension Blood pressure well-controlled with current regimen. - Continue current antihypertensive regimen.                Follow-up: Return in about 1 year (around 10/30/2025).  Signed, Darryle DASEN. Barbaraann, MD, [facility]  [facility]  [address] [place], [place] [address] [phone]  7:31 PM   "

## 2024-10-30 ENCOUNTER — Encounter: Payer: Self-pay | Admitting: Cardiovascular Disease

## 2024-10-30 ENCOUNTER — Ambulatory Visit: Admitting: Cardiovascular Disease

## 2024-10-30 VITALS — BP 110/66 | HR 80 | Ht 75.0 in | Wt 192.0 lb

## 2024-10-30 DIAGNOSIS — E782 Mixed hyperlipidemia: Secondary | ICD-10-CM | POA: Diagnosis not present

## 2024-10-30 DIAGNOSIS — I480 Paroxysmal atrial fibrillation: Secondary | ICD-10-CM

## 2024-10-30 DIAGNOSIS — Z01810 Encounter for preprocedural cardiovascular examination: Secondary | ICD-10-CM

## 2024-10-30 NOTE — Patient Instructions (Signed)
 Medication Instructions:  Your physician recommends that you continue on your current medications as directed. Please refer to the Current Medication list given to you today.  *If you need a refill on your cardiac medications before your next appointment, please call your pharmacy*  Lab Work: NONE If you have labs (blood work) drawn today and your tests are completely normal, you will receive your results only by: MyChart Message (if you have MyChart) OR A paper copy in the mail If you have any lab test that is abnormal or we need to change your treatment, we will call you to review the results.  Testing/Procedures: NONE  Follow-Up: At [facility], you and your health needs are our priority.  As part of our continuing mission to provide you with exceptional heart care, our providers are all part of one team.  This team includes your primary Cardiologist (physician) and Advanced Practice Providers or APPs (Physician Assistants and Nurse Practitioners) who all work together to provide you with the care you need, when you need it.  Your next appointment:   1 year(s)  Provider:   One of our Advanced Practice Providers (APPs): Morse Clause, PA-C  Lamarr Satterfield, NP Miriam Shams, NP  Olivia Pavy, PA-C Josefa Beauvais, NP  Leontine Salen, PA-C Orren Fabry, PA-C  [place], PA-C Ernest Dick, NP  Damien Braver, NP Jon Hails, PA-C  Waddell Donath, PA-C    Dayna Dunn, PA-C  Scott Weaver, PA-C Lum Louis, NP Katlyn West, NP Callie Goodrich, PA-C  Xika Zhao, NP Sheng Haley, PA-C    Kathleen Johnson, PA-C

## 2024-11-01 ENCOUNTER — Ambulatory Visit (HOSPITAL_COMMUNITY)
Admission: RE | Admit: 2024-11-01 | Discharge: 2024-11-01 | Disposition: A | Source: Ambulatory Visit | Attending: *Deleted | Admitting: *Deleted

## 2024-11-01 DIAGNOSIS — C679 Malignant neoplasm of bladder, unspecified: Secondary | ICD-10-CM | POA: Diagnosis not present

## 2024-11-01 DIAGNOSIS — C61 Malignant neoplasm of prostate: Secondary | ICD-10-CM | POA: Diagnosis present

## 2024-11-01 DIAGNOSIS — L24B3 Irritant contact dermatitis related to fecal or urinary stoma or fistula: Secondary | ICD-10-CM | POA: Diagnosis not present

## 2024-11-01 DIAGNOSIS — Z436 Encounter for attention to other artificial openings of urinary tract: Secondary | ICD-10-CM

## 2024-11-01 NOTE — Progress Notes (Signed)
 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 Past Medical History:  Diagnosis Date   Anticoagulant long-term use    eliquis --- managed by cardiology   Aortic atherosclerosis 05/30/2024   Arthritis    Bladder cancer [facility])    urologist--- dr nieves;  overlapping   Cataract    History of basal cell carcinoma (BCC) excision    History of COVID-19 05/2019   per pt mild symptoms that resolved   HLD (hyperlipidemia)    HTN (hypertension)    Hx of colonic polyps    Insulin  dependent type 2 diabetes mellitus [facility])    endocrinologist--- dr trixie --  (03-11-2022 per pt check blood sugar 1-2 times daily,  fasting sugar-- 118-120s)   Nocturia    Paroxysmal atrial fibrillation (HCC) 03/12/2019   cardiologist--- dr calhoun   Renal disorder    Ureterocele    Use of cane as ambulatory aid    Family History  Problem Relation Age of Onset   Pancreatic cancer Mother 56   Cancer Paternal Uncle        unknown type cancer   Aortic aneurysm Other        family history   Cancer Other        Breast (1st degree relative)   Colon cancer Neg Hx    Rectal cancer Neg Hx    Stomach cancer Neg Hx    Esophageal cancer Neg Hx    Allergies[1] Current Outpatient Medications  Medication Sig Dispense Refill Last Dose/Taking   amoxicillin  (AMOXIL ) 500 MG capsule TAKE 2 CAPSULES (1,000 MG TOTAL) BY MOUTH 3 (THREE) TIMES DAILY. 180 capsule 0    apixaban  (ELIQUIS ) 5 MG TABS tablet Take 1 tablet (5 mg total) by mouth 2 (two) times daily. 180 tablet 1    atorvastatin  (LIPITOR) 40 MG tablet Take 1 tablet (40 mg total) by mouth at bedtime.      B Complex-C (B-COMPLEX WITH VITAMIN C) tablet Take 1 tablet by mouth 3 (three) times a week.      Cholecalciferol  (VITAMIN D -3 PO) Take 1 tablet by mouth 3 (three) times a week.      lidocaine  (LIDODERM ) 5 % Place 1 patch onto the skin daily. Remove &  Discard patch within 12 hours or as directed by MD 30 patch 0    methocarbamol  (ROBAXIN ) 500 MG tablet Take 1 tablet (500 mg total) by mouth every 8 (eight) hours as needed for muscle spasms. 60 tablet 0    metoprolol  succinate (TOPROL -XL) 25 MG 24 hr tablet Take 1 tablet (25 mg total) by mouth daily. 90 tablet 2    polyethylene glycol (MIRALAX  / GLYCOLAX ) 17 g packet Take 17 g by mouth daily.      propranolol  (INDERAL ) 10 MG tablet TAKE 1 TABLET BY MOUTH 4 TIMES DAILY AS NEEDED FOR PALPITATIONS, IRREGULAR OR FAST HEART RATE 270 tablet 1    Semaglutide , 1 MG/DOSE, (OZEMPIC , 1 MG/DOSE,) 4 MG/3ML SOPN INJECT 1 MG UNDER THE SKIN ONCE A WEEK AS DIRECTED 9 mL 0    senna-docusate (SENOKOT-S) 8.6-50 MG tablet Take 1-2 tablets by mouth 2 (two) times daily between meals as needed for mild constipation or moderate constipation.      TYLENOL  500 MG tablet Take 1,000 mg by mouth daily as needed for mild pain (pain score 1-3), headache or moderate pain (pain score 4-6).      No current  facility-administered medications for this encounter.   Facility-Administered Medications Ordered in Other Encounters  Medication Dose Route Frequency Provider Last Rate Last Admin   sodium chloride  flush (NS) 0.9 % injection 10 mL  10 mL Intracatheter PRN Lanny Callander, MD   10 mL at 02/15/24 1353   ROS  Review of Systems  Constitutional: Negative.   Respiratory: Negative.    Cardiovascular:        Hx atrial fibrillation and hypertension  Gastrointestinal: Negative.   Genitourinary:        Hx bladder cancer with ureterostomy  Musculoskeletal: Negative.   Neurological: Negative.   Psychiatric/Behavioral: Negative.    All other systems reviewed and are negative.  Vital signs:  BP 123/89 (BP Location: Right Arm)   Pulse 67   Temp 97.6 F (36.4 C) (Oral)   Resp 17   SpO2 100%  Exam:  Physical Exam Vitals reviewed.  Constitutional:      Appearance: Normal appearance.  HENT:     Mouth/Throat:     Mouth: Mucous  membranes are moist.  Cardiovascular:     Rate and Rhythm: Normal rate.  Pulmonary:     Effort: Pulmonary effort is normal.  Abdominal:     Palpations: Abdomen is soft.  Musculoskeletal:     Comments: Uses cane for safety  Skin:    General: Skin is warm and dry.     Findings: Erythema present.  Neurological:     Mental Status: He is alert and oriented to person, place, and time.     Stoma type/location:  LLQ ureterostomy Stomal assessment/size:  opening is 0.3 cm with gray stent in place.  Opening is macerated and red Peristomal assessment:  skin is macerated.  Barrier opening cut too large.  Will re size pouch opening, implement flonase  for irritation and reinforce use of stoma powder and skin prep Treatment options for stomal/peristomal skin: flonase  for irritation stoma powder skin prep and barrier ring.  Reduce barrier size to minimize urine exposure on skin  Output: clear yellow Ostomy pouching: 1pc. Convex with barrier ring Education provided:  use of flonase  for aerosol steroid use on irritated skin.  Stoma powder and skin prep with barrier ring.  1 piece convex pouch, cut to 5/8     Impression/dx  Ureterostomy Irritant contact dermatitis Moisture associated skin damage Discussion  Flonase  for irritation  Stoma powder and skin prep  1 piece convex pouch Plan  See back as needed    Visit time: 40 minutes.   Darice Cooley FNP-BC        [1]  Allergies Allergen Reactions   Lisinopril  Swelling and Other (See Comments)    Tongue and throat became swollen

## 2024-11-05 DIAGNOSIS — L24B3 Irritant contact dermatitis related to fecal or urinary stoma or fistula: Secondary | ICD-10-CM | POA: Insufficient documentation

## 2024-11-20 ENCOUNTER — Inpatient Hospital Stay

## 2024-11-20 ENCOUNTER — Inpatient Hospital Stay: Admitting: Hematology

## 2024-11-20 ENCOUNTER — Inpatient Hospital Stay: Attending: Hematology

## 2024-11-20 VITALS — BP 124/70 | HR 62 | Temp 98.0°F | Resp 16 | Ht 75.0 in | Wt 193.9 lb

## 2024-11-20 DIAGNOSIS — C679 Malignant neoplasm of bladder, unspecified: Secondary | ICD-10-CM

## 2024-11-20 DIAGNOSIS — G8929 Other chronic pain: Secondary | ICD-10-CM | POA: Insufficient documentation

## 2024-11-20 DIAGNOSIS — Z5112 Encounter for antineoplastic immunotherapy: Secondary | ICD-10-CM | POA: Diagnosis present

## 2024-11-20 DIAGNOSIS — C675 Malignant neoplasm of bladder neck: Secondary | ICD-10-CM | POA: Insufficient documentation

## 2024-11-20 DIAGNOSIS — Z79899 Other long term (current) drug therapy: Secondary | ICD-10-CM | POA: Insufficient documentation

## 2024-11-20 DIAGNOSIS — M549 Dorsalgia, unspecified: Secondary | ICD-10-CM | POA: Insufficient documentation

## 2024-11-20 LAB — CBC WITH DIFFERENTIAL (CANCER CENTER ONLY)
Abs Immature Granulocytes: 0.07 10*3/uL (ref 0.00–0.07)
Basophils Absolute: 0.1 10*3/uL (ref 0.0–0.1)
Basophils Relative: 1 %
Eosinophils Absolute: 0.1 10*3/uL (ref 0.0–0.5)
Eosinophils Relative: 1 %
HCT: 41.9 % (ref 39.0–52.0)
Hemoglobin: 14.5 g/dL (ref 13.0–17.0)
Immature Granulocytes: 1 %
Lymphocytes Relative: 26 %
Lymphs Abs: 2.5 10*3/uL (ref 0.7–4.0)
MCH: 29.4 pg (ref 26.0–34.0)
MCHC: 34.6 g/dL (ref 30.0–36.0)
MCV: 85 fL (ref 80.0–100.0)
Monocytes Absolute: 0.8 10*3/uL (ref 0.1–1.0)
Monocytes Relative: 8 %
Neutro Abs: 5.9 10*3/uL (ref 1.7–7.7)
Neutrophils Relative %: 63 %
Platelet Count: 170 10*3/uL (ref 150–400)
RBC: 4.93 MIL/uL (ref 4.22–5.81)
RDW: 15.4 % (ref 11.5–15.5)
WBC Count: 9.3 10*3/uL (ref 4.0–10.5)
nRBC: 0 % (ref 0.0–0.2)

## 2024-11-20 LAB — TSH: TSH: 1.18 u[IU]/mL (ref 0.350–4.500)

## 2024-11-20 LAB — CMP (CANCER CENTER ONLY)
ALT: 14 U/L (ref 0–44)
AST: 18 U/L (ref 15–41)
Albumin: 4.5 g/dL (ref 3.5–5.0)
Alkaline Phosphatase: 103 U/L (ref 38–126)
Anion gap: 12 (ref 5–15)
BUN: 22 mg/dL (ref 8–23)
CO2: 23 mmol/L (ref 22–32)
Calcium: 9.6 mg/dL (ref 8.9–10.3)
Chloride: 103 mmol/L (ref 98–111)
Creatinine: 1.24 mg/dL (ref 0.61–1.24)
GFR, Estimated: >60 mL/min
Glucose, Bld: 138 mg/dL — ABNORMAL HIGH (ref 70–99)
Potassium: 4.2 mmol/L (ref 3.5–5.1)
Sodium: 138 mmol/L (ref 135–145)
Total Bilirubin: 0.4 mg/dL (ref 0.0–1.2)
Total Protein: 7.4 g/dL (ref 6.5–8.1)

## 2024-11-20 MED ORDER — NIVOLUMAB-HYALURONIDASE-NVHY 600-10000 MG-UT/5ML ~~LOC~~ SOLN
1200.0000 mg | Freq: Once | SUBCUTANEOUS | Status: AC
  Administered 2024-11-20: 1200 mg via SUBCUTANEOUS
  Filled 2024-11-20: qty 10

## 2024-11-20 NOTE — Progress Notes (Signed)
 " [facility]   [phone] [phone]) [phone]   Clinic Follow up Note   Patient Care Team: Plotnikov, Karlynn GAILS, MD as PCP - General (Internal Medicine) Nahser, Aleene PARAS, MD (Inactive) as PCP - Cardiology (Cardiology) Liane Sharyne MATSU, [facility] (Inactive) as Pharmacist (Pharmacist) Trixie File, MD as Consulting Physician (Internal Medicine) Nieves Cough, MD as Consulting Physician (Urology) Avram Lupita BRAVO, MD as Consulting Physician (Gastroenterology) Patrcia Sharper, MD as Consulting Physician (Ophthalmology) Lanny Callander, MD as Attending Physician (Hematology and Oncology) Prentiss Heddy HERO, RN as [facility], [place], [place] as [facility] Care Management  Date of Service:  11/20/2024  CHIEF COMPLAINT: f/u of bladder cancer  CURRENT THERAPY:  Adjuvant nivolumab  every 28 days  Oncology History   Bladder cancer [facility]) Bladder cancer (HCC) -cT2N0M0 stage II diagnosed in 04/2023 -He has had 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 -He is clinically doing well, asymptomatic.  Surgical resection was discussed with patient, but he would like to try nonsurgical options. -He started Keytruda  on 01/12/23, stopped in June 2024 due to disease progression. 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 -I recommended neoadjuvant chemotherapy with cisplatin  and gemcitabine  for 4 cycles, followed by total cystectomy and  prostatectomy. -He started chemotherapy on June 08, 2023, and completed on 08/17/2023, overall tolerated well  -He underwent right robotic right nephroureterectomy, robotic radical cystectomy with total urethrectomy, bilateral pelvic lymph node dissection, and left cutaneous ureterostomy by Dr. Alvaro on 12/08/2023.. surgical path showed multifocal high-grade urothelial carcinoma, right renal pelvis, ureter, bladder, and prostatic urethra, surgical margines negative, 1 positive node  -He started adjuvant Nivo on 02/15/2024, plan for one year  -CT 03/27/2024 showed no radiographic evidence of malignancy   Assessment & Plan Malignant neoplasm of bladder neck He is nearing completion of immunotherapy for bladder neck carcinoma, with good tolerance and no significant adverse effects. Recent CT scan by urology showed no evidence of recurrence. Laboratory studies, including complete blood count, renal, hepatic, and thyroid  function, are within normal limits. He has gained weight, reports improved back pain and physical activity. He experiences intermittent mild diarrhea. Immunotherapy reduces recurrence risk, but ongoing surveillance is required. - Requested recent CT scan results from urology. - Reviewed laboratory studies including complete blood count, renal, hepatic, and thyroid  function. - Scheduled final immunotherapy treatments for February and March. - Planned post-treatment surveillance with CT imaging every six months for the first three years, then annually for two additional years (total five years). - Provided reassurance regarding safety of upcoming hip surgery during immunotherapy. - Discussed recurrence monitoring and emphasized the importance of ongoing surveillance.  Plan - Lab reviewed, adequate for treatment, will proceed nivolumab  today and continue every 4 weeks for additional 2 more doses. - Follow-up in 4 weeks - Will request his recent surveillance CT scan report from his urology  office - He is cleared from my standpoint for hip replacement in April 2026.   SUMMARY OF ONCOLOGIC HISTORY: Oncology History Overview Note   Cancer Staging  Bladder cancer [facility]) Staging form: Urinary Bladder, AJCC 8th Edition - Clinical stage from 05/14/2021: Stage 0a (cTa, cN0, cM0) - Signed by Lanny Callander, MD on 01/02/2023 Stage prefix: Initial diagnosis WHO/ISUP grade (low/high): High Grade Histologic grading system: 2 grade system - Pathologic stage from 02/22/2022: Stage I (pT1, pN0, cM0) - Signed by Lanny Callander, MD on 01/02/2023 WHO/ISUP grade (low/high): High Grade Histologic grading system: 2 grade system     Bladder cancer (HCC)  05/14/2021 Cancer Staging   Staging form: Urinary Bladder, AJCC 8th Edition - Clinical stage from 05/14/2021: Stage 0a (cTa, cN0, cM0) - Signed by Lanny Callander, MD on 01/02/2023 Stage prefix: Initial diagnosis WHO/ISUP grade (low/high): High Grade Histologic grading system: 2 grade system   01/31/2022 Initial Diagnosis   Bladder cancer (HCC)   02/22/2022 Cancer Staging   Staging form: Urinary Bladder, AJCC 8th Edition - Pathologic stage from 02/22/2022: Stage I (pT1, pN0, cM0) - Signed by Lanny Callander, MD on 01/02/2023 WHO/ISUP grade (low/high): High Grade Histologic grading system: 2 grade system   01/12/2023 - 04/06/2023 Chemotherapy   Patient is on Treatment Plan : BLADDER Pembrolizumab  (200) q21d     05/05/2023 Pathology Results     FINAL MICROSCOPIC DIAGNOSIS:   A. BLADDER, RIGHT POSTERIOR, TURBT:  Noninvasive high grade papillary urothelial carcinoma  Muscularis propria (detrusor muscle) is not present   B. BLADDER NECK AND PROSTATIC URETHRA, BIOPSY:  High grade urothelial carcinoma  The carcinoma focally invades muscularis propria (detrusor muscle)     05/05/2023 Cancer Staging   Staging form: Urinary Bladder, AJCC 8th Edition - Clinical stage from 05/05/2023: Stage II (cT2, cN0, cM0) - Signed by Lanny Callander, MD on 06/15/2023 WHO/ISUP grade  (low/high): High Grade Histologic grading system: 2 grade system   06/01/2023 Imaging   CT Chest, Abdomen, and Pelvis with contrast  IMPRESSION: 1. Similar irregular asymmetric wall thickening along the posterior urinary bladder with progressive irregular thickening of the right ureter extending to the level of the renal pelvis where there is now a masslike 16 x 12 mm soft tissue focus, suspicious for direct ureteral extension of disease into the renal pelvis. 2. Irregular asymmetric wall thickening along the posterior urinary bladder measuring 14 mm is similar prior. 3. Right double-J ureteral stent is in place with persistent perinephric/periureteric stranding without significant hydronephrosis. 4. No convincing evidence of metastatic disease in the chest, abdomen or pelvis 5. Pancolonic diverticulosis without findings of acute diverticulitis. 6. Aortic Atherosclerosis (ICD10-I70.0) and Emphysema (ICD10-J43.9).   06/09/2023 - 08/17/2023 Chemotherapy   Patient is on Treatment Plan : BLADDER Cisplatin  D1 + Gemcitabine  D1,8 q21d x 4 Cycles     02/15/2024 -  Chemotherapy   Patient is on Treatment Plan : BLADDER Nivolumab  (480) q28d        Discussed the use of AI scribe software for clinical note transcription with the patient, who gave verbal consent to proceed.  History of Present Illness Robert [name] is a 74 year old male with bladder neck cancer undergoing immunotherapy who presents for routine oncology follow-up.  He has tolerated immunotherapy well without significant adverse effects or complications. He uses acetaminophen  only occasionally for pain. Recent CT imaging 2-3 weeks ago showed no evidence of disease recurrence. CBC and renal, hepatic, and thyroid  function tests have remained within normal limits.  Chronic back pain has improved and is now mild morning aching only. He is scheduled for hip arthroplasty in late April.  He has intermittent diarrhea about every two  weeks, lasting  1-2 days, which he links to larger food intake. Over the past six months he has gained 10-12 pounds with improved strength, ambulation, and light exercise capacity, and reports gradual improvement of longstanding muscle atrophy.  He has not had new symptoms attributable to his cancer or immunotherapy, and there have been no new medical conditions or medications since his last visit.     All other systems were reviewed with the patient and are negative.  MEDICAL HISTORY:  Past Medical History:  Diagnosis Date   Anticoagulant long-term use    eliquis --- managed by cardiology   Aortic atherosclerosis 05/30/2024   Arthritis    Bladder cancer [facility])    urologist--- dr nieves;  overlapping   Cataract    History of basal cell carcinoma (BCC) excision    History of COVID-19 05/2019   per pt mild symptoms that resolved   HLD (hyperlipidemia)    HTN (hypertension)    Hx of colonic polyps    Insulin  dependent type 2 diabetes mellitus [facility])    endocrinologist--- dr trixie --  (03-11-2022 per pt check blood sugar 1-2 times daily,  fasting sugar-- 118-120s)   Nocturia    Paroxysmal atrial fibrillation (HCC) 03/12/2019   cardiologist--- dr calhoun   Renal disorder    Ureterocele    Use of cane as ambulatory aid     SURGICAL HISTORY: Past Surgical History:  Procedure Laterality Date   BLEPHAROPLASTY Bilateral    per pt approx  2001;   upper eyelid's   COLONOSCOPY     last one approx 2023   CYSTOSCOPY W/ RETROGRADES N/A 12/08/2023   Procedure: CYSTOSCOPY;  Surgeon: Alvaro Ricardo KATHEE Mickey., MD;  Location: [facility];  Service: Urology;  Laterality: N/A;  390 MINUTES NEEDED FOR CASE   CYSTOSCOPY WITH URETEROSCOPY AND STENT PLACEMENT Right 12/02/2022   Procedure: CYSTOSCOPY WITH RIGHT URETEROSCOPY, RIGHT DIAGNOSTIC URETEROSCOPY;  Surgeon: Nieves Cough, MD;  Location: [facility];  Service: Urology;  Laterality: Right;  90 MINS FOR CASE   CYSTOSCOPY/RETROGRADE/URETEROSCOPY Bilateral  05/05/2023   Procedure: CYSTOSCOPY BILATERAL RETROGRADE PYELOGRAM RIGHT URETEROSCOPY RIGHT STENT PLACEMENT;  Surgeon: Nieves Cough, MD;  Location: [facility];  Service: Urology;  Laterality: Bilateral;  90 MINS FOR CASE   IR IMAGING GUIDED PORT INSERTION  05/30/2023   IR LUMBAR DISC ASPIRATION W/IMG GUIDE  05/12/2024   IR REMOVAL TUN ACCESS W/ PORT W/O FL MOD SED  03/29/2024   PATELLAR TENDON REPAIR Left 04/16/2004   @[facility]    ROBOT ASSITED LAPAROSCOPIC NEPHROURETERECTOMY Right 12/08/2023   Procedure: RIGHT ROBOT ASSISTED Nephroureterectomy;  Surgeon: Alvaro Ricardo KATHEE Mickey., MD;  Location: [facility];  Service: Urology;  Laterality: Right;   ROBOT LAP RADICAL CYSTOPROSTATECTOMY PELVIC LYMPHADENECTOMY, NEOBLADDER  12/08/2023   Procedure: ROBOT ASSISTED LAPAROSCOPIC RADICAL CYSTOPROSTATECTOMY;  Surgeon: Alvaro Ricardo KATHEE Mickey., MD;  Location: [facility];  Service: Urology;;   TOTAL URETHRECTOMY,RADICAL N/A 12/08/2023   Procedure: TOTAL URETHRECTOMY,RADICAL;  Surgeon: Alvaro Ricardo KATHEE Mickey., MD;  Location: [facility];  Service: Urology;  Laterality: N/A;   TRANSESOPHAGEAL ECHOCARDIOGRAM (CATH LAB) N/A 04/02/2024   Procedure: TRANSESOPHAGEAL ECHOCARDIOGRAM;  Surgeon: Delford Maude BROCKS, MD;  Location: [facility] INVASIVE CV LAB;  Service: Cardiovascular;  Laterality: N/A;   TRANSURETHRAL RESECTION OF BLADDER TUMOR N/A 06/18/2021   Procedure: TRANSURETHRAL RESECTION OF BLADDER TUMOR (TURBT) WITH RIGHT URETERAL STENT PLACEMENT, RIGHT URETEROSCOPY WITH DISTRACTION OF TUMOR, FULGURATION;  Surgeon: Nieves Cough, MD;  Location: [facility];  Service: Urology;  Laterality: N/A;   TRANSURETHRAL  RESECTION OF BLADDER TUMOR N/A 03/15/2022   Procedure: TRANSURETHRAL RESECTION OF BLADDER TUMOR (TURBT) WITH BILATERAL URETEROSCOPY/URETHRAL/ DILATION/BILATERAL RETROGRADE PYELOGRAM/BIOPSY AND FULGURATION OF RIGHT URETERAL CANCER/RIGHT STENT PLACEMENT;  Surgeon: Nieves Cough, MD;  Location: [facility];  Service: Urology;  Laterality: N/A;   TRANSURETHRAL RESECTION OF BLADDER TUMOR N/A 05/05/2023   Procedure: TRANSURETHRAL RESECTION OF BLADDER TUMOR (TURBT) BLADDER BIOSPY;  Surgeon: Nieves Cough, MD;  Location: [facility];  Service: Urology;  Laterality: N/A;   TRANSURETHRAL RESECTION OF BLADDER TUMOR WITH MITOMYCIN -C N/A 12/02/2022   Procedure: TRANSURETHRAL RESECTION OF BLADDER TUMOR;  Surgeon: Nieves Cough, MD;  Location: [facility];  Service: Urology;  Laterality: N/A;   TRANSURETHRAL RESECTION OF PROSTATE  05/05/2023   Procedure: TRANSURETHRAL RESECTION OF THE PROSTATE (TURP);  Surgeon: Nieves Cough, MD;  Location: [facility];  Service: Urology;;    I have reviewed the social history and family history with the patient and they are unchanged from previous note.  ALLERGIES:  is allergic to lisinopril .  MEDICATIONS:  Current Outpatient Medications  Medication Sig Dispense Refill   amoxicillin  (AMOXIL ) 500 MG capsule TAKE 2 CAPSULES (1,000 MG TOTAL) BY MOUTH 3 (THREE) TIMES DAILY. 180 capsule 0   apixaban  (ELIQUIS ) 5 MG TABS tablet Take 1 tablet (5 mg total) by mouth 2 (two) times daily. 180 tablet 1   atorvastatin  (LIPITOR) 40 MG tablet Take 1 tablet (40 mg total) by mouth at bedtime.     B Complex-C (B-COMPLEX WITH VITAMIN C) tablet Take 1 tablet by mouth 3 (three) times a week.     Cholecalciferol  (VITAMIN D -3 PO) Take 1 tablet by mouth 3 (three) times a week.     lidocaine  (LIDODERM ) 5 % Place 1 patch onto the skin daily. Remove & Discard patch within 12 hours or as directed by MD 30 patch 0   methocarbamol  (ROBAXIN ) 500 MG tablet Take 1 tablet (500 mg total) by mouth every 8 (eight) hours as needed for muscle spasms. 60 tablet 0   metoprolol  succinate (TOPROL -XL) 25 MG 24 hr tablet Take 1 tablet (25 mg total) by mouth daily. 90 tablet 2   polyethylene glycol (MIRALAX  / GLYCOLAX ) 17 g packet Take 17 g by mouth daily.     propranolol   (INDERAL ) 10 MG tablet TAKE 1 TABLET BY MOUTH 4 TIMES DAILY AS NEEDED FOR PALPITATIONS, IRREGULAR OR FAST HEART RATE 270 tablet 1   Semaglutide , 1 MG/DOSE, (OZEMPIC , 1 MG/DOSE,) 4 MG/3ML SOPN INJECT 1 MG UNDER THE SKIN ONCE A WEEK AS DIRECTED 9 mL 0   senna-docusate (SENOKOT-S) 8.6-50 MG tablet Take 1-2 tablets by mouth 2 (two) times daily between meals as needed for mild constipation or moderate constipation.     TYLENOL  500 MG tablet Take 1,000 mg by mouth daily as needed for mild pain (pain score 1-3), headache or moderate pain (pain score 4-6).     No current facility-administered medications for this visit.   Facility-Administered Medications Ordered in Other Visits  Medication Dose Route Frequency Provider Last Rate Last Admin   sodium chloride  flush (NS) 0.9 % injection 10 mL  10 mL Intracatheter PRN Lanny Callander, MD   10 mL at 02/15/24 1353    PHYSICAL EXAMINATION: ECOG PERFORMANCE STATUS: 2 - Symptomatic, <50% confined to bed  Vitals:   11/20/24 1223  BP: 124/70  Pulse: 62  Resp: 16  Temp: 98 F (36.7 C)  SpO2: 99%   Wt Readings from Last 3 Encounters:  11/20/24 193  lb 14.4 oz (88 kg)  10/30/24 192 lb (87.1 kg)  09/25/24 188 lb 4.8 oz (85.4 kg)     GENERAL:alert, no distress and comfortable SKIN: skin color, texture, turgor are normal, no rashes or significant lesions Musculoskeletal:no cyanosis of digits and no clubbing  NEURO: alert & oriented x 3 with fluent speech, no focal motor/sensory deficits  Physical Exam    LABORATORY DATA:  I have reviewed the data as listed    Latest Ref Rng & Units 11/20/2024   12:06 PM 10/23/2024   12:07 PM 09/25/2024   12:55 PM  CBC  WBC 4.0 - 10.5 K/uL 9.3  8.9  8.6   Hemoglobin 13.0 - 17.0 g/dL 85.4  85.5  85.5   Hematocrit 39.0 - 52.0 % 41.9  43.2  42.2   Platelets 150 - 400 K/uL 170  169  159         Latest Ref Rng & Units 11/20/2024   12:06 PM 10/23/2024   12:07 PM 09/25/2024   12:55 PM  CMP  Glucose 70 - 99 mg/dL 861   833  856   BUN 8 - 23 mg/dL 22  25  22    Creatinine 0.61 - 1.24 mg/dL 8.75  8.74  8.80   Sodium 135 - 145 mmol/L 138  140  139   Potassium 3.5 - 5.1 mmol/L 4.2  4.4  4.0   Chloride 98 - 111 mmol/L 103  104  105   CO2 22 - 32 mmol/L 23  23  24    Calcium  8.9 - 10.3 mg/dL 9.6  9.7  9.7   Total Protein 6.5 - 8.1 g/dL 7.4  7.2  7.3   Total Bilirubin 0.0 - 1.2 mg/dL 0.4  0.4  0.4   Alkaline Phos 38 - 126 U/L 103  98  94   AST 15 - 41 U/L 18  22  24    ALT 0 - 44 U/L 14  26  26        RADIOGRAPHIC STUDIES: I have personally reviewed the radiological images as listed and agreed with the findings in the report. No results found.    No orders of the defined types were placed in this encounter.  All questions were answered. The patient knows to call the clinic with any problems, questions or concerns. No barriers to learning was detected. The total time spent in the appointment was 25 minutes, including review of chart and various tests results, discussions about plan of care and coordination of care plan     Onita Mattock, MD 11/20/2024     "

## 2024-11-20 NOTE — Patient Instructions (Signed)
 [facility] - A DEPT OF [facility]  Discharge Instructions: Thank you for choosing [facility] to provide your oncology and hematology care.   If you have a lab appointment with the Cancer Center, please go directly to the Cancer Center and check in at the registration area.   Wear comfortable clothing and clothing appropriate for easy access to any Portacath or PICC line.   We strive to give you quality time with your provider. You may need to reschedule your appointment if you arrive late (15 or more minutes).  Arriving late affects you and other patients whose appointments are after yours.  Also, if you miss three or more appointments without notifying the office, you may be dismissed from the clinic at the provider's discretion.      For prescription refill requests, have your pharmacy contact our office and allow 72 hours for refills to be completed.    Today you received the following chemotherapy and/or immunotherapy agents: Nivolumab  (opdivo  qvantig)      To help prevent nausea and vomiting after your treatment, we encourage you to take your nausea medication as directed.  BELOW ARE SYMPTOMS THAT SHOULD BE REPORTED IMMEDIATELY: *FEVER GREATER THAN 100.4 F (38 C) OR HIGHER *CHILLS OR SWEATING *NAUSEA AND VOMITING THAT IS NOT CONTROLLED WITH YOUR NAUSEA MEDICATION *UNUSUAL SHORTNESS OF BREATH *UNUSUAL BRUISING OR BLEEDING *URINARY PROBLEMS (pain or burning when urinating, or frequent urination) *BOWEL PROBLEMS (unusual diarrhea, constipation, pain near the anus) TENDERNESS IN MOUTH AND THROAT WITH OR WITHOUT PRESENCE OF ULCERS (sore throat, sores in mouth, or a toothache) UNUSUAL RASH, SWELLING OR PAIN  UNUSUAL VAGINAL DISCHARGE OR ITCHING   Items with * indicate a potential emergency and should be followed up as soon as possible or go to the Emergency Department if any problems should occur.  Please show the CHEMOTHERAPY ALERT CARD  or IMMUNOTHERAPY ALERT CARD at check-in to the Emergency Department and triage nurse.  Should you have questions after your visit or need to cancel or reschedule your appointment, please contact [facility] - A DEPT OF [facility]  Dept: [phone]  and follow the prompts.  Office hours are 8:00 a.m. to 4:30 p.m. Monday - Friday. Please note that voicemails left after 4:00 p.m. may not be returned until the following business day.  We are closed weekends and major holidays. You have access to a nurse at all times for urgent questions. Please call the main number to the clinic Dept: [phone] and follow the prompts.   For any non-urgent questions, you may also contact your provider using MyChart. We now offer e-Visits for anyone 81 and older to request care online for non-urgent symptoms. For details visit [email or web address].   Also download the MyChart app! Go to the app store, search MyChart, open the app, select [place], and log in with your MyChart username and password.

## 2024-11-20 NOTE — Assessment & Plan Note (Signed)
 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 -He is clinically doing well, asymptomatic.  Surgical resection was discussed with patient, but he would like to try nonsurgical options. -He started Keytruda  on 01/12/23, stopped in June 2024 due to disease progression. 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 -I recommended neoadjuvant chemotherapy with cisplatin  and gemcitabine  for 4 cycles, followed by total cystectomy and prostatectomy. -He started chemotherapy on June 08, 2023, and completed on 08/17/2023, overall tolerated well  -He underwent right robotic right nephroureterectomy, robotic radical cystectomy with total urethrectomy, bilateral pelvic lymph node dissection, and left cutaneous ureterostomy by Dr. Alvaro on 12/08/2023.. surgical path showed multifocal high-grade urothelial carcinoma, right renal pelvis, ureter, bladder, and prostatic urethra, surgical margines negative, 1 positive node  -He started adjuvant Nivo on 02/15/2024, plan for one year  -CT 03/27/2024 showed no radiographic evidence of malignancy

## 2024-11-21 ENCOUNTER — Encounter: Payer: Self-pay | Admitting: Urology

## 2024-11-21 LAB — T4: T4, Total: 8.7 ug/dL (ref 4.5–12.0)

## 2024-11-25 ENCOUNTER — Ambulatory Visit: Admitting: Internal Medicine

## 2024-11-25 MED ORDER — PROPOFOL 10 MG/ML IV BOLUS
INTRAVENOUS | Status: AC
  Filled 2024-11-25: qty 20

## 2024-11-27 ENCOUNTER — Encounter: Payer: Self-pay | Admitting: Internal Medicine

## 2024-11-27 ENCOUNTER — Other Ambulatory Visit

## 2024-11-27 ENCOUNTER — Ambulatory Visit: Admitting: Internal Medicine

## 2024-11-27 VITALS — BP 122/60 | HR 71 | Ht 75.0 in | Wt 195.0 lb

## 2024-11-27 DIAGNOSIS — E785 Hyperlipidemia, unspecified: Secondary | ICD-10-CM

## 2024-11-27 DIAGNOSIS — E119 Type 2 diabetes mellitus without complications: Secondary | ICD-10-CM

## 2024-11-27 DIAGNOSIS — E663 Overweight: Secondary | ICD-10-CM

## 2024-11-27 MED ORDER — OZEMPIC (1 MG/DOSE) 4 MG/3ML ~~LOC~~ SOPN: 1.0000 mg | PEN_INJECTOR | SUBCUTANEOUS | 3 refills | Status: AC

## 2024-11-27 MED ORDER — GLIPIZIDE ER 2.5 MG PO TB24: 5.0000 mg | ORAL_TABLET | Freq: Every day | ORAL | Status: DC

## 2024-11-27 MED ORDER — GLUCOSE BLOOD VI STRP: ORAL_STRIP | 3 refills | Status: AC

## 2024-11-27 MED ORDER — ACCU-CHEK SOFTCLIX LANCETS MISC: 3 refills | Status: AC

## 2024-11-27 MED ORDER — ACCU-CHEK GUIDE W/DEVICE KIT: PACK | 0 refills | Status: AC

## 2024-11-27 MED ORDER — GLIPIZIDE ER 2.5 MG PO TB24: 5.0000 mg | ORAL_TABLET | Freq: Every day | ORAL | 3 refills | Status: AC

## 2024-11-27 NOTE — Progress Notes (Signed)
 Patient ID: Robert [name], male   DOB: [birth year], 74 y.o.   MRN: [id number]   HPI: Robert [name] is a 74 y.o.-year-old male, returning for follow-up for  DM2, dx in 2000s, insulin -independent since 2018, uncontrolled, with complications (PN, ED, CKD).  Last visit 9 months ago.  Interim history: He has a history of bladder cancer and had transurethral resection on 06/18/2021.  The cancer was high-grade, but noninvasive. He was on BCG bladder instillations - off and on Eliquis .  He had a cancer recurrence before our visit in 11/2021. He was on Keytruda .  He had TURP 12/02/2022.  He completed chemotherapy.  At last visit he was preparing to have surgery to remove the bladder, prostate, and part of kidney. He had this 12/08/2023. He then developed a UTI and bacteriemia >> had IV ABx, then on po ABx.  At last visit he was preparing to start immunotherapy. He will finish this in 12/2024. Since last visit, he was admitted with AKI in 02/2024 and then acute osteomyelitis of the spine in 04/2024.  He is preparing for R THR in 12/2024. No blurry vision, nausea, chest pain.   Reviewed HbA1c levels: Lab Results  Component Value Date   HGBA1C 7.1 (H) 05/31/2024   HGBA1C 7.1 (A) 02/13/2024   HGBA1C 5.9 (A) 10/05/2023   HGBA1C 6.5 (A) 06/12/2023   HGBA1C 6.7 (A) 02/03/2023   HGBA1C 6.2 (H) 11/25/2022   HGBA1C 6.5 (A) 08/01/2022   HGBA1C 6.7 (A) 03/28/2022   HGBA1C 7.6 (A) 12/06/2021   HGBA1C 6.7 (A) 08/03/2021  06/22/2023: HbA1c 6.5% (outside)  Pt is on a regimen of: - Trulicity  1.5 >> 3 mg weekly >> Ozempic  1 mg weekly - Metformin  Er 1000 mg 2x a day >> 2000 mg with dinner >> diarrhea >> 1000 mg 2x a day >> OFF - Glipizide  5 mg 2x a day >> 2.5 >> 5 mg mg before b'fast We stopped Novolog  70/30 10 units 2x a day in 11/2017, when starting Trulicity .  Pt  is not checking sugars- lost meter - from 2 weeks ago: - am: 125-167, 180 >> 120-140, 230s >> 135-154 (snacks at night) >> 99,  120-138 - 2h after b'fast:119-147, 157 > 126-155, 182 >> 136-171 >> n/c - before lunch:  135-142 >> 140-173, 239 >> n/c >> 99, 110 >> n/c - 2h after lunch: 160-179, 282 >> 172 >> 135 >> n/c >> 160-198, 210 >> 1h after the meal: occas: 220-228 - before dinner: 128, 132-159, 207 >> 147 >> 107-171 >> 112-128 >> n/c - 2h after dinner: 121-162, 188 >> n/c >> 110-123 >> 138, 146, 202 >> n/c - bedtime: 99-150s >> 103-135, 159 >> 88-123 >> n/a >> 104-112 >> 130-140s - nighttime: 129 >> 91 (wine) >> 118 >> n/c Lowest sugar was 28 right after starting insulin  >> ... 88 >> 99 >> 102 >> 99; he has hypoglycemia awareness in the 70s. Highest sugar was 280s >> ... 254 >> 210 >> 228.  Glucometer: Reli[employer]  Pt's meals are: - Breakfast: whole wheat toast + butte - Lunch: tuna salad or tuna s/andwich ([employer] delivery), soft taco, meat + baked beans or potatoes - Dinner: [employer] delivery - Snacks: 1-0: nuts, bananas, other fruit He decreased his alcohol  intake. He was swimming 5 times a week before the coronavirus pandemic, now exercising at home.  -+  CKD; last BUN/creatinine:  Lab Results  Component Value Date   BUN 22 11/20/2024   BUN 25 (  H) 10/23/2024   CREATININE 1.24 11/20/2024   CREATININE 1.25 (H) 10/23/2024   Lab Results  Component Value Date   MICRALBCREAT 431 (H) 02/13/2024   MICRALBCREAT 172 (H) 10/05/2023   MICRALBCREAT 4.4 12/07/2009   MICRALBCREAT 30.6 (H) 11/26/2007  Off lisinopril  40 >> now stopped 2/2 angioedema.  He was in the emergency room with angioedema 11/20 and 21//2024. This was considered 2/2 Lisinopril . He was treated with injectable steroids.  He now has an EpiPen .  He had leg swelling before stopping lisinopril , but this resolved.  -+ HL; last set of lipids: Lab Results  Component Value Date   CHOL 116 02/27/2023   HDL 38.60 (L) 02/27/2023   LDLCALC 47 02/27/2023   LDLDIRECT 77.0 07/07/2015   TRIG 150.0 (H) 02/27/2023   CHOLHDL 3 02/27/2023  On Lipitor  40 and co-Q10.  - last eye exam was 05/02/2024: No DR.  - + numbness but no tingling in his feet.  Last foot exam 06/12/2023.    He has a h/o arrhythmia (A fib) - wore an event monitor >> now on beta blocker. Cardiologist: Dr. Alveta. He has hip pain (OA) in the hip - he prefers to stand, walks with a cane.  He needs a hip replacement.  ROS: + See HPI  I reviewed pt's medications, allergies, PMH, social hx, family hx, and changes were documented in the history of present illness. Otherwise, unchanged from my initial visit note.  Past Medical History:  Diagnosis Date   Anticoagulant long-term use    eliquis --- managed by cardiology   Aortic atherosclerosis 05/30/2024   Arthritis    Bladder cancer [facility])    urologist--- dr nieves;  overlapping   Cataract    History of basal cell carcinoma (BCC) excision    History of COVID-19 05/2019   per pt mild symptoms that resolved   HLD (hyperlipidemia)    HTN (hypertension)    Hx of colonic polyps    Insulin  dependent type 2 diabetes mellitus [facility])    endocrinologist--- dr trixie --  (03-11-2022 per pt check blood sugar 1-2 times daily,  fasting sugar-- 118-120s)   Nocturia    Paroxysmal atrial fibrillation (HCC) 03/12/2019   cardiologist--- dr calhoun   Renal disorder    Ureterocele    Use of cane as ambulatory aid    Past Surgical History:  Procedure Laterality Date   BLEPHAROPLASTY Bilateral    per pt approx  2001;   upper eyelid's   COLONOSCOPY     last one approx 2023   CYSTOSCOPY W/ RETROGRADES N/A 12/08/2023   Procedure: CYSTOSCOPY;  Surgeon: Alvaro Ricardo KATHEE Mickey., MD;  Location: [facility];  Service: Urology;  Laterality: N/A;  390 MINUTES NEEDED FOR CASE   CYSTOSCOPY WITH URETEROSCOPY AND STENT PLACEMENT Right 12/02/2022   Procedure: CYSTOSCOPY WITH RIGHT URETEROSCOPY, RIGHT DIAGNOSTIC URETEROSCOPY;  Surgeon: Nieves Cough, MD;  Location: [facility];  Service: Urology;  Laterality: Right;  90 MINS FOR CASE    CYSTOSCOPY/RETROGRADE/URETEROSCOPY Bilateral 05/05/2023   Procedure: CYSTOSCOPY BILATERAL RETROGRADE PYELOGRAM RIGHT URETEROSCOPY RIGHT STENT PLACEMENT;  Surgeon: Nieves Cough, MD;  Location: [facility];  Service: Urology;  Laterality: Bilateral;  90 MINS FOR CASE   IR IMAGING GUIDED PORT INSERTION  05/30/2023   IR LUMBAR DISC ASPIRATION W/IMG GUIDE  05/12/2024   IR REMOVAL TUN ACCESS W/ PORT W/O FL MOD SED  03/29/2024   PATELLAR TENDON REPAIR Left 04/16/2004   @[facility]    ROBOT ASSITED LAPAROSCOPIC NEPHROURETERECTOMY Right 12/08/2023  Procedure: RIGHT ROBOT ASSISTED Nephroureterectomy;  Surgeon: Alvaro Ricardo KATHEE Mickey., MD;  Location: [facility];  Service: Urology;  Laterality: Right;   ROBOT LAP RADICAL CYSTOPROSTATECTOMY PELVIC LYMPHADENECTOMY, NEOBLADDER  12/08/2023   Procedure: ROBOT ASSISTED LAPAROSCOPIC RADICAL CYSTOPROSTATECTOMY;  Surgeon: Alvaro Ricardo KATHEE Mickey., MD;  Location: [facility];  Service: Urology;;   TOTAL URETHRECTOMY,RADICAL N/A 12/08/2023   Procedure: TOTAL URETHRECTOMY,RADICAL;  Surgeon: Alvaro Ricardo KATHEE Mickey., MD;  Location: [facility];  Service: Urology;  Laterality: N/A;   TRANSESOPHAGEAL ECHOCARDIOGRAM (CATH LAB) N/A 04/02/2024   Procedure: TRANSESOPHAGEAL ECHOCARDIOGRAM;  Surgeon: Delford Maude BROCKS, MD;  Location: [facility] INVASIVE CV LAB;  Service: Cardiovascular;  Laterality: N/A;   TRANSURETHRAL RESECTION OF BLADDER TUMOR N/A 06/18/2021   Procedure: TRANSURETHRAL RESECTION OF BLADDER TUMOR (TURBT) WITH RIGHT URETERAL STENT PLACEMENT, RIGHT URETEROSCOPY WITH DISTRACTION OF TUMOR, FULGURATION;  Surgeon: Nieves Cough, MD;  Location: [facility];  Service: Urology;  Laterality: N/A;   TRANSURETHRAL RESECTION OF BLADDER TUMOR N/A 03/15/2022   Procedure: TRANSURETHRAL RESECTION OF BLADDER TUMOR (TURBT) WITH BILATERAL URETEROSCOPY/URETHRAL/ DILATION/BILATERAL RETROGRADE PYELOGRAM/BIOPSY AND FULGURATION OF RIGHT URETERAL CANCER/RIGHT STENT PLACEMENT;  Surgeon: Nieves Cough, MD;  Location: [facility];  Service: Urology;  Laterality: N/A;   TRANSURETHRAL RESECTION OF BLADDER TUMOR N/A 05/05/2023   Procedure: TRANSURETHRAL RESECTION OF BLADDER TUMOR (TURBT) BLADDER BIOSPY;  Surgeon: Nieves Cough, MD;  Location: [facility];  Service: Urology;  Laterality: N/A;   TRANSURETHRAL RESECTION OF BLADDER TUMOR WITH MITOMYCIN -C N/A 12/02/2022   Procedure: TRANSURETHRAL RESECTION OF BLADDER TUMOR;  Surgeon: Nieves Cough, MD;  Location: [facility];  Service: Urology;  Laterality: N/A;   TRANSURETHRAL RESECTION OF PROSTATE  05/05/2023   Procedure: TRANSURETHRAL RESECTION OF THE PROSTATE (TURP);  Surgeon: Nieves Cough, MD;  Location: [facility];  Service: Urology;;   Social History   Socioeconomic History   Marital status: Married    Spouse name: Not on file   Number of children: 2  Social Needs  Occupational History   Occupation: DVD[employer]   Occupation: Occupational Hygienist: Self Employed, retired  Tobacco Use   Smoking status: Former Smoker    Types: Cigars    Last attempt to quit: 02/22/2011   Smokeless tobacco: Never Used   Tobacco comment: 2 cigars monthly or less  Substance and Sexual Activity   Alcohol  use: Yes     0.0 oz    Vodka, 2-3 drinks 3x a week   Drugs: No   Current Outpatient Medications on File Prior to Visit  Medication Sig Dispense Refill   amoxicillin  (AMOXIL ) 500 MG capsule TAKE 2 CAPSULES (1,000 MG TOTAL) BY MOUTH 3 (THREE) TIMES DAILY. 180 capsule 0   apixaban  (ELIQUIS ) 5 MG TABS tablet Take 1 tablet (5 mg total) by mouth 2 (two) times daily. 180 tablet 1   atorvastatin  (LIPITOR) 40 MG tablet Take 1 tablet (40 mg total) by mouth at bedtime.     B Complex-C (B-COMPLEX WITH VITAMIN C) tablet Take 1 tablet by mouth 3 (three) times a week.     Cholecalciferol  (VITAMIN D -3 PO) Take 1 tablet by mouth 3 (three) times a week.     lidocaine  (LIDODERM ) 5 % Place 1 patch onto the skin  daily. Remove & Discard patch within 12 hours or as directed by MD 30 patch 0   methocarbamol  (ROBAXIN ) 500 MG tablet Take 1 tablet (500 mg total) by mouth every 8 (eight) hours as needed for muscle spasms.  60 tablet 0   metoprolol  succinate (TOPROL -XL) 25 MG 24 hr tablet Take 1 tablet (25 mg total) by mouth daily. 90 tablet 2   polyethylene glycol (MIRALAX  / GLYCOLAX ) 17 g packet Take 17 g by mouth daily.     propranolol  (INDERAL ) 10 MG tablet TAKE 1 TABLET BY MOUTH 4 TIMES DAILY AS NEEDED FOR PALPITATIONS, IRREGULAR OR FAST HEART RATE 270 tablet 1   Semaglutide , 1 MG/DOSE, (OZEMPIC , 1 MG/DOSE,) 4 MG/3ML SOPN INJECT 1 MG UNDER THE SKIN ONCE A WEEK AS DIRECTED 9 mL 0   senna-docusate (SENOKOT-S) 8.6-50 MG tablet Take 1-2 tablets by mouth 2 (two) times daily between meals as needed for mild constipation or moderate constipation.     TYLENOL  500 MG tablet Take 1,000 mg by mouth daily as needed for mild pain (pain score 1-3), headache or moderate pain (pain score 4-6).     Current Facility-Administered Medications on File Prior to Visit  Medication Dose Route Frequency Provider Last Rate Last Admin   sodium chloride  flush (NS) 0.9 % injection 10 mL  10 mL Intracatheter PRN Lanny Callander, MD   10 mL at 02/15/24 1353   Allergies  Allergen Reactions   Lisinopril  Swelling and Other (See Comments)    Tongue and throat became swollen   Family History  Problem Relation Age of Onset   Pancreatic cancer Mother 66   Cancer Paternal Uncle        unknown type cancer   Aortic aneurysm Other        family history   Cancer Other        Breast (1st degree relative)   Colon cancer Neg Hx    Rectal cancer Neg Hx    Stomach cancer Neg Hx    Esophageal cancer Neg Hx    PE: BP 122/60   Pulse 71   Ht 6' 3 (1.905 m)   Wt 195 lb (88.5 kg)   SpO2 97%   BMI 24.37 kg/m  Wt Readings from Last 20 Encounters:  11/27/24 195 lb (88.5 kg)  11/20/24 193 lb 14.4 oz (88 kg)  10/30/24 192 lb (87.1 kg)  09/25/24 188  lb 4.8 oz (85.4 kg)  09/24/24 195 lb 12.8 oz (88.8 kg)  08/28/24 185 lb 11.2 oz (84.2 kg)  08/08/24 186 lb (84.4 kg)  07/31/24 184 lb 6.4 oz (83.6 kg)  07/10/24 186 lb (84.4 kg)  07/03/24 183 lb 6.4 oz (83.2 kg)  05/31/24 189 lb (85.7 kg)  05/23/24 189 lb (85.7 kg)  05/10/24 190 lb (86.2 kg)  05/09/24 179 lb 4.8 oz (81.3 kg)  04/23/24 188 lb 9.6 oz (85.5 kg)  04/17/24 186 lb (84.4 kg)  04/12/24 196 lb (88.9 kg)  04/11/24 193 lb 4.8 oz (87.7 kg)  04/08/24 200 lb (90.7 kg)  03/27/24 200 lb (90.7 kg)   Constitutional: overweight, in NAD Eyes:  EOMI, no exophthalmos ENT: no neck masses, no cervical lymphadenopathy Cardiovascular: RRR, No MRG Respiratory: CTA B Musculoskeletal: no deformities Skin:no rashes Neurological: no tremor with outstretched hands Diabetic Foot Exam - Simple   Simple Foot Form Diabetic Foot exam was performed with the following findings: Yes 11/27/2024  1:27 PM  Visual Inspection No deformities, no ulcerations, no other skin breakdown bilaterally: Yes Sensation Testing Intact to touch and monofilament testing bilaterally: Yes Pulse Check Posterior Tibialis and Dorsalis pulse intact bilaterally: Yes Comments + dry skin + mild pitting edema B    ASSESSMENT: 1. DM2, insulin -independent, uncontrolled, with complications - PN -  ED  2. HL  3.  Overweight  PLAN:  1. Patient with longstanding, reasonably controlled type 2 diabetes, on sulfonylurea and weekly GLP-1 receptor agonist, recurring after longer absence of 9 months.  At last visit, HbA1c was higher, at 7.1% and he had another HbA1c obtained 6 months ago, at the same value. - At last visit, sugars were higher in the morning and they were still at goal before dinner.  He was not checking sugars consistently in the rest of the day.  He was having snacks at night and I advised him to stop.  He was out of metformin  for at least a week and I advised him to hold off restarting it due to his decreased  kidney function.  We discussed about doubling the dose of glipizide  if needed if the sugars were higher and we continued the same Ozempic  dose. - He is off ACE inhibitor's due to previous angioedema with lisinopril . In the light of his current cancer and complicated UTI with hematuria and sepsis, and now  s/p TURP, we held off starting an SGLT2 inhibitor.  He had a high urinary ACR at last checks, however, this is likely related to not having a bladder.  I feel that further investigation for CKD should be done by blood work.  Latest BUN/creatinine were stable. - At today's visit, he mentions that he lost his glucometer and he did not check sugars in the last 2 weeks.  Previously, sugars improved after he stopped snacking at night, but they were still high after lunch.  We discussed about improving his lunch, but otherwise I did not suggest a change in his regimen.  He does not have any lows so for now we can continue the same dose of glipizide .  I refilled his Ozempic  and glipizide  today.  I also sent a prescription for glucometer + supplies to his pharmacy. - I suggested to:  Patient Instructions  Please continue: - Glipizide  2.5 mg before breakfast  - Ozempic  1 mg weekly  Please stop at the lab.  Please return in 4 months with your sugar log.  - we checked his HbA1c: 6.2% (lower) - advised to check sugars at different times of the day - 1x a day, rotating check times - advised for yearly eye exams >> he is UTD - return to clinic in 4 months  2.  HL - Latest lipid panel was reviewed from 2024: LDL and triglycerides at goal, HDL low: Lab Results  Component Value Date   CHOL 116 02/27/2023   HDL 38.60 (L) 02/27/2023   LDLCALC 47 02/27/2023   LDLDIRECT 77.0 07/07/2015   TRIG 150.0 (H) 02/27/2023   CHOLHDL 3 02/27/2023  -He continues Lipitor 40 mg daily without side effects - Will check another lipid panel today-nonfasting  3.  Overweight - He lost approximately 26 pounds immediately  after starting GLP-1 receptor agonist and after switching to a more plant-based diet - Weight was approximately stable at last visit.  We discussed about stopping the snacks at night.  He was able to do this since last visit - since last OV, he lost 20 lbs initially, but then gained 10 back so he is net -10 pounds since last visit  Lela Fendt, MD PhD [facility] Endocrinology

## 2024-11-27 NOTE — Patient Instructions (Addendum)
 Please continue: - Glipizide  ER 5 mg before breakfast  - Ozempic  1 mg weekly  Please stop at the lab.  Please return in 4 months.

## 2024-11-28 ENCOUNTER — Ambulatory Visit: Payer: Self-pay | Admitting: Internal Medicine

## 2024-11-28 LAB — LIPID PANEL W/REFLEX DIRECT LDL
Cholesterol: 127 mg/dL
HDL: 38 mg/dL — ABNORMAL LOW
LDL Cholesterol (Calc): 68 mg/dL
Non-HDL Cholesterol (Calc): 89 mg/dL
Total CHOL/HDL Ratio: 3.3 (calc)
Triglycerides: 127 mg/dL

## 2024-12-09 ENCOUNTER — Telehealth

## 2024-12-18 ENCOUNTER — Inpatient Hospital Stay: Attending: Hematology

## 2024-12-18 ENCOUNTER — Inpatient Hospital Stay: Admitting: Nurse Practitioner

## 2024-12-18 ENCOUNTER — Inpatient Hospital Stay

## 2024-12-18 ENCOUNTER — Inpatient Hospital Stay: Admitting: Hematology

## 2024-12-31 ENCOUNTER — Ambulatory Visit: Admitting: Physician Assistant

## 2025-01-15 ENCOUNTER — Inpatient Hospital Stay: Attending: Hematology

## 2025-01-15 ENCOUNTER — Inpatient Hospital Stay

## 2025-01-15 ENCOUNTER — Inpatient Hospital Stay: Admitting: Hematology

## 2025-01-21 ENCOUNTER — Ambulatory Visit (HOSPITAL_COMMUNITY): Admit: 2025-01-21 | Admitting: Orthopedic Surgery

## 2025-03-24 ENCOUNTER — Ambulatory Visit: Admitting: Internal Medicine

## 2025-04-10 ENCOUNTER — Ambulatory Visit
